# Patient Record
Sex: Female | Born: 1975
Health system: Southern US, Community
[De-identification: ages and names within clinical notes are randomized; demographics above are authoritative.]

## PROBLEM LIST (undated history)

## (undated) ENCOUNTER — Ambulatory Visit (HOSPITAL_COMMUNITY): Payer: No Typology Code available for payment source | Source: Home / Self Care

## (undated) ENCOUNTER — Inpatient Hospital Stay (HOSPITAL_COMMUNITY): Payer: Self-pay

## (undated) DIAGNOSIS — M2550 Pain in unspecified joint: Secondary | ICD-10-CM

## (undated) DIAGNOSIS — T7840XA Allergy, unspecified, initial encounter: Secondary | ICD-10-CM

## (undated) DIAGNOSIS — R0602 Shortness of breath: Secondary | ICD-10-CM

## (undated) DIAGNOSIS — I10 Essential (primary) hypertension: Secondary | ICD-10-CM

## (undated) DIAGNOSIS — G43019 Migraine without aura, intractable, without status migrainosus: Secondary | ICD-10-CM

## (undated) DIAGNOSIS — R5383 Other fatigue: Secondary | ICD-10-CM

## (undated) DIAGNOSIS — K5900 Constipation, unspecified: Secondary | ICD-10-CM

## (undated) DIAGNOSIS — E538 Deficiency of other specified B group vitamins: Secondary | ICD-10-CM

## (undated) DIAGNOSIS — M549 Dorsalgia, unspecified: Secondary | ICD-10-CM

## (undated) DIAGNOSIS — G971 Other reaction to spinal and lumbar puncture: Secondary | ICD-10-CM

## (undated) DIAGNOSIS — R002 Palpitations: Secondary | ICD-10-CM

## (undated) DIAGNOSIS — I38 Endocarditis, valve unspecified: Secondary | ICD-10-CM

## (undated) DIAGNOSIS — K589 Irritable bowel syndrome without diarrhea: Secondary | ICD-10-CM

## (undated) DIAGNOSIS — D649 Anemia, unspecified: Secondary | ICD-10-CM

## (undated) DIAGNOSIS — M797 Fibromyalgia: Secondary | ICD-10-CM

## (undated) DIAGNOSIS — K219 Gastro-esophageal reflux disease without esophagitis: Secondary | ICD-10-CM

## (undated) DIAGNOSIS — Z975 Presence of (intrauterine) contraceptive device: Secondary | ICD-10-CM

## (undated) DIAGNOSIS — M7989 Other specified soft tissue disorders: Secondary | ICD-10-CM

## (undated) DIAGNOSIS — J45909 Unspecified asthma, uncomplicated: Secondary | ICD-10-CM

## (undated) HISTORY — DX: Essential (primary) hypertension: I10

## (undated) HISTORY — DX: Endocarditis, valve unspecified: I38

## (undated) HISTORY — PX: OTHER SURGICAL HISTORY: SHX169

## (undated) HISTORY — DX: Other specified soft tissue disorders: M79.89

## (undated) HISTORY — DX: Shortness of breath: R06.02

## (undated) HISTORY — DX: Gastro-esophageal reflux disease without esophagitis: K21.9

## (undated) HISTORY — DX: Allergy, unspecified, initial encounter: T78.40XA

## (undated) HISTORY — PX: ADENOIDECTOMY: SUR15

## (undated) HISTORY — DX: Other fatigue: R53.83

## (undated) HISTORY — DX: Migraine without aura, intractable, without status migrainosus: G43.019

## (undated) HISTORY — DX: Unspecified asthma, uncomplicated: J45.909

## (undated) HISTORY — DX: Presence of (intrauterine) contraceptive device: Z97.5

## (undated) HISTORY — DX: Deficiency of other specified B group vitamins: E53.8

## (undated) HISTORY — DX: Pain in unspecified joint: M25.50

## (undated) HISTORY — DX: Fibromyalgia: M79.7

## (undated) HISTORY — DX: Dorsalgia, unspecified: M54.9

## (undated) HISTORY — DX: Constipation, unspecified: K59.00

## (undated) HISTORY — DX: Palpitations: R00.2

---
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
## 2001-04-12 HISTORY — PX: OTHER SURGICAL HISTORY: SHX169

## 2003-04-13 DIAGNOSIS — K589 Irritable bowel syndrome without diarrhea: Secondary | ICD-10-CM

## 2003-04-13 HISTORY — DX: Irritable bowel syndrome, unspecified: K58.9

## 2003-04-13 HISTORY — PX: VAGINA SURGERY: SHX829

## 2011-05-10 ENCOUNTER — Inpatient Hospital Stay (HOSPITAL_COMMUNITY)
Admission: AD | Admit: 2011-05-10 | Discharge: 2011-05-10 | Disposition: A | Discharge status: Home / Self Care | Payer: Medicaid Other | Source: Ambulatory Visit | Attending: Obstetrics & Gynecology | Admitting: Obstetrics & Gynecology

## 2011-05-10 ENCOUNTER — Encounter (HOSPITAL_COMMUNITY): Payer: Self-pay

## 2011-05-10 ENCOUNTER — Inpatient Hospital Stay (HOSPITAL_COMMUNITY): Payer: Medicaid Other

## 2011-05-10 DIAGNOSIS — R109 Unspecified abdominal pain: Secondary | ICD-10-CM | POA: Insufficient documentation

## 2011-05-10 DIAGNOSIS — O0930 Supervision of pregnancy with insufficient antenatal care, unspecified trimester: Secondary | ICD-10-CM

## 2011-05-10 DIAGNOSIS — R1031 Right lower quadrant pain: Secondary | ICD-10-CM

## 2011-05-10 DIAGNOSIS — N949 Unspecified condition associated with female genital organs and menstrual cycle: Secondary | ICD-10-CM

## 2011-05-10 HISTORY — DX: Anemia, unspecified: D64.9

## 2011-05-10 LAB — URINALYSIS, ROUTINE W REFLEX MICROSCOPIC
Glucose, UA: NEGATIVE mg/dL
Leukocytes, UA: NEGATIVE
Nitrite: NEGATIVE
Specific Gravity, Urine: 1.02 (ref 1.005–1.030)
pH: 6 (ref 5.0–8.0)

## 2011-05-10 LAB — CBC
HCT: 34.8 % — ABNORMAL LOW (ref 36.0–46.0)
MCHC: 33 g/dL (ref 30.0–36.0)
Platelets: 212 10*3/uL (ref 150–400)
RDW: 14.9 % (ref 11.5–15.5)
WBC: 8.5 10*3/uL (ref 4.0–10.5)

## 2011-05-10 LAB — RPR
RPR Ser Ql: NONREACTIVE
RPR: NONREACTIVE

## 2011-05-10 LAB — RUBELLA SCREEN: Rubella: 127.3 IU/mL — ABNORMAL HIGH

## 2011-05-10 LAB — WET PREP, GENITAL: Yeast Wet Prep HPF POC: NONE SEEN

## 2011-05-10 LAB — DIFFERENTIAL
Basophils Absolute: 0 10*3/uL (ref 0.0–0.1)
Basophils Relative: 0 % (ref 0–1)
Monocytes Absolute: 0.4 10*3/uL (ref 0.1–1.0)
Neutro Abs: 5.2 10*3/uL (ref 1.7–7.7)
Neutrophils Relative %: 61 % (ref 43–77)

## 2011-05-10 LAB — HEPATITIS B SURFACE ANTIGEN: Hepatitis B Surface Ag: NEGATIVE

## 2011-05-10 LAB — URINE MICROSCOPIC-ADD ON

## 2011-05-10 LAB — RAPID HIV SCREEN (WH-MAU): Rapid HIV Screen: NONREACTIVE

## 2011-05-10 NOTE — ED Provider Notes (Signed)
History     Chief Complaint  Patient presents with  . Abdominal Pain   HPI Visit conducted using phone interpreter services. Pt  36 y/o [id number] with 36.1 weeks that comes to MAU for abdominal pain. She has had prenatal care in [place] but did not bring any document with her. Her EDD is apparently base in prior ultrasound in her home country. She is been here [place] for two days.  Has prior history of health and Ob history of 1 neonatal death. All pregnancies at term and normal baby weight. Prior babies delivered by C-section. On this pregnancy she mentions has had treatment with bacteriorstatic for urinary symptoms since she is 1 month pregnant(?) Today she has had mild right lower abdominal pain with irradiation to inguinal area. Also complaint of dysuria. Has had episodes of non-bloody, non-mucous diarrhea. Afebrile. Her other complaint is lower extremity edema and no symptoms of headaches, visual disturbances nor epigastric abdominal pain. Normal white vaginal discharge, no vaginal bleeding.    OB History    Grav Para Term Preterm Abortions TAB SAB Ect Mult Living   4 3 3       2       Past Medical History  Diagnosis Date  . Anemia   . Neonatal death     Vaginal delivery, full term-lived x2 hours.     Past Surgical History  Procedure Date  . Cesarean section     Family History  Problem Relation Age of Onset  . Anesthesia problems Neg Hx     History  Substance Use Topics  . Smoking status: Never Smoker   . Smokeless tobacco: Never Used  . Alcohol Use: No    Allergies: No Known Allergies  Prescriptions prior to admission  Medication Sig Dispense Refill  . BISOPROLOL FUMARATE PO Take 1 tablet by mouth 2 (two) times daily. Arabic medication, for fast heartbeat, has not had in the last 10 days      . PRESCRIPTION MEDICATION Take 1 tablet by mouth daily. Ritodrine (Yutopar) 10mg , Arabic medication used to stop contractions      . PRESCRIPTION MEDICATION Take 1  tablet by mouth daily as needed. Navidoxine (Meclozine 25mg  & Pyridoxine50mg ) used for nausea & vomiting, arabic medication      . PRESCRIPTION MEDICATION Take 2-3 tablets by mouth 3 (three) times daily after meals. (Moxal) Magnesium Hydroxide 100mg /hydroxide gel 450mg  used for heartburn, arabic medication        Review of Systems  Constitutional: Negative.   HENT: Negative.   Eyes: Negative.   Respiratory: Negative.   Cardiovascular: Negative.   Gastrointestinal: Positive for abdominal pain. Negative for nausea and vomiting.  Genitourinary: Positive for dysuria. Negative for urgency and frequency.  Musculoskeletal: Negative.   Skin: Negative.   Neurological: Negative.   Psychiatric/Behavioral: Negative.    Physical Exam   Blood pressure 112/80, pulse 91, temperature 98.2 F (36.8 C), temperature source Oral, resp. rate 16.  Physical Exam  Constitutional: She is oriented to person, place, and time.  HENT:  Head: Normocephalic and atraumatic.  Mouth/Throat: Oropharynx is clear and moist. No oropharyngeal exudate.  Eyes: Conjunctivae and EOM are normal. Pupils are equal, round, and reactive to light. No scleral icterus.  Neck: Neck supple. No tracheal deviation present. No thyromegaly present.  Cardiovascular: Normal rate, regular rhythm, normal heart sounds and intact distal pulses.  Exam reveals no gallop and no friction rub.   No murmur heard. Respiratory: Effort normal and breath sounds normal. No respiratory distress.  She has no wheezes. She has no rales.  GI: Soft. Bowel sounds are normal.       Normal physical exam of a 36 weeks gravid pt.  Musculoskeletal:       Mild lower extremity edema+1  Lymphadenopathy:    She has no cervical adenopathy.  Neurological: She is alert and oriented to person, place, and time. She has normal reflexes. No cranial nerve deficit.  Skin: Skin is warm and dry.    MAU Course  Procedures  MDM Reassuring FHT Ordered: prenatal panel, [place],  Urine micro, GC and Chlamydia and GBS.  Assessment and Plan  Pt  36 y/o [id number] with 36.1 weeks that comes to MAU for abdominal pain most consistent with round ligament pain. Awaiting results of tests ordered. Pt discussed with incoming team.    D. [place] Sherron Flemings Paz. MD PGY-1 05/10/2011, 12:10 PM

## 2011-05-10 NOTE — Progress Notes (Signed)
Pt states via [employer] interpreter that she has been here from [place] x2days. States abd pain, lower extremity swelling. Frequent bowel movements. Has headache also. +FM. Denies bleeding or vaginal discharge changes. Pain only on right lower abdomen. Notes burning and urgency with voiding.

## 2011-05-10 NOTE — ED Provider Notes (Signed)
History     Chief Complaint  Patient presents with  . Abdominal Pain   HPI Pelvic exam done  ROS Physical Exam   Blood pressure 112/80, pulse 91, temperature 98.2 F (36.8 C), temperature source Oral, resp. rate 16.  Physical Exam  MAU Course  Procedures   Assessment and Plan  GC/Chlam and Wet Prep and GBS done.  [place] normal. Cephalic. Will refer to [facility] clinic.   Wynelle Bourgeois 05/10/2011, 1:02 PM

## 2011-05-12 NOTE — ED Provider Notes (Signed)
Seen by me .  Agree.

## 2011-05-13 LAB — CULTURE, BETA STREP (GROUP B ONLY)

## 2011-05-19 ENCOUNTER — Ambulatory Visit (INDEPENDENT_AMBULATORY_CARE_PROVIDER_SITE_OTHER): Payer: Medicaid Other | Admitting: Physician Assistant

## 2011-05-19 VITALS — BP 121/81 | Temp 98.9°F | Ht <= 58 in | Wt 148.7 lb

## 2011-05-19 DIAGNOSIS — O0930 Supervision of pregnancy with insufficient antenatal care, unspecified trimester: Secondary | ICD-10-CM

## 2011-05-19 DIAGNOSIS — O34219 Maternal care for unspecified type scar from previous cesarean delivery: Secondary | ICD-10-CM

## 2011-05-19 DIAGNOSIS — O4100X0 Oligohydramnios, unspecified trimester, not applicable or unspecified: Secondary | ICD-10-CM

## 2011-05-19 LAB — POCT URINALYSIS DIP (DEVICE)
Protein, ur: 30 mg/dL — AB
Specific Gravity, Urine: 1.02 (ref 1.005–1.030)
Urobilinogen, UA: 1 mg/dL (ref 0.0–1.0)

## 2011-05-19 LAB — GLUCOSE, CAPILLARY: Glucose-Capillary: 84 mg/dL (ref 70–99)

## 2011-05-19 NOTE — Progress Notes (Signed)
Needs prescription for prenatal vitamins, P=106, Used [employer], Discussed appropritate weight gain, given new pregnancy booklet, c/o pelvic pain at old c/section scar, declines influenza vaccine, states had tetanus shot 2008, had prenatal care in [place]-records not available,  states had glucola was normal- per [place], CNM do fingerstick today- last po intake 3 hours ago was tea with sugar

## 2011-05-19 NOTE — Patient Instructions (Signed)
Cesarean Delivery °Care After °Refer to this sheet in the next few weeks. These instructions provide you with information on caring for yourself after your procedure. Your caregiver may also give you more specific instructions. Your treatment has been planned according to current medical practices, but problems sometimes occur. Call your caregiver if you have any problems or questions after your procedure. °HOME CARE INSTRUCTIONS °Healing will take time. You will have discomfort, tenderness, swelling, and bruising at the surgery site for a couple of weeks. This is normal and will get better as time goes on. °Activity °· Rest as much as possible the first 2 weeks.  °· When possible, have someone help you with your household activities and your baby for 2 to 3 weeks.  °· Limit your housework and social activity. Increase your activity gradually as your strength returns.  °· Do not climb stairs more than 2 to 3 times a day.  °· Do not lift anything heavier than your baby.  °· Follow your caregiver's instructions about driving a car.  °· Exercise only as directed by your caregiver.  °Nutrition °· You may return to your usual diet. Eat a well-balanced diet.  °· Drink enough fluid to keep your urine clear or pale yellow.  °· Keep taking your prenatal or multivitamins.  °· Do not drink alcohol until your caregiver says it is okay.  °Elimination °You should return to your usual bowel function. If you develop constipation, ask your caregiver about taking a mild laxative that will help you go to the bathroom. Bran foods and fluids help with constipation. Gradually add fruit, vegetables, and bran to your diet.  °Hygiene °· You may shower, wash your hair, and take tub baths unless your caregiver tells you otherwise.  °· Continue perineal care until your vaginal bleeding and discharge stops.  °· Do not douche or use tampons until your caregiver says it is okay.  °Fever °If you feel feverish or have shaking chills, take your  temperature. The fever may indicate infection. Infections can be treated with antibiotic medicine. °Pain Control and Medicine °· Only take over-the-counter or prescription medicine as directed by your caregiver. Do not take aspirin. It can cause bleeding.  °· Do not drive when taking pain medicine.  °· Talk to your caregiver about restarting or adjusting your normal medicines.  °Incision Care °· Clean your cut (incision) gently with soap and water, then pat dry.  °· If your caregiver says it is okay, leave the incision without a bandage (dressing) unless it is draining fluid or irritated.  °· If you have small adhesive strips across the incision and they do not fall off within 7 days, carefully peel them off.  °· Check the incision daily for increased redness, drainage, swelling, or separation of skin.  °· Hug a pillow when coughing or sneezing. This helps to relieve pain.  °Vaginal Care °You may have a vaginal discharge or bleeding for up to 6 weeks. If the vaginal discharge becomes bright red, bad smelling, heavy in amount, has blood clots, or if you have burning or frequent urination, call your caregiver. If your bleeding slows down and then gets heavier, your body is telling you to slow down and relax more. °Sexual Intercourse °· Check with your caregiver before resuming sexual activity. Often, after 4 to 6 weeks, if you feel good and are well rested, sexual activity may be resumed. Avoid positions that strain the incision site.  °· You can become pregnant before you have a period.   If you decide to have sexual intercourse, use birth control if you do not want to become pregnant right away.  °Health Practices °· Keep all your postpartum appointments as recommended by your caregiver. Generally, your caregiver will want to see you in 2 to 3 weeks.  °· Continue with your yearly pelvic exams.  °· Continue monthly self-breast exams and yearly physical exams with a Pap test.  °Breast Care °· If you are not  breastfeeding and your breasts become tender, hard, or leak milk, you may wear a tight-fitting bra and apply ice to your breasts.  °· If you are breastfeeding, wear a good support bra.  °· Call your caregiver if you have breast pain, flu-like symptoms, fever, or hardness and reddening of your breasts.  °Postpartum Blues °You may have a period of low spirits or "blues" after your baby is born. Discuss your feelings with your partner, family, and friends. This may be caused by the changing hormone levels in your body. You may want to contact your caregiver if this is worrisome. °Miscellaneous °· Limit wearing support panties or control-top hose.  °· If you breastfeed, you may not have a period for several months or longer. This is normal for the nursing mother. If you do not menstruate within 6 weeks after you stop breastfeeding, see your caregiver.  °· If you are not breastfeeding, you can expect to menstruate within 6 to 10 weeks after birth. If you have not started by the 11th week, check with your caregiver.  °SEEK MEDICAL CARE IF:  °· There is swelling, redness, or increasing pain in the wound area.  °· You have pus coming from the wound.  °· You notice a bad smell from the wound or surgical dressing.  °· You have pain, redness, and swelling from the intravenous (IV) site.  °· Your wound breaks open (the edges are not staying together).  °· You feel dizzy or feel like fainting.  °· You develop pain or bleeding when you urinate.  °· You develop diarrhea.  °· You develop nausea and vomiting.  °· You develop abnormal vaginal discharge.  °· You develop a rash.  °· You have any type of abnormal reaction or develop an allergy to your medicine.  °· Your pain is not relieved by your medicine or becomes worse.  °· Your temperature is 101° F (38.3° C), or is 100.4° F (38° C) taken 2 times in a 4 hour period.  °SEEK IMMEDIATE MEDICAL CARE: °· You develop a temperature of 102° F (38.9° C) or higher.  °· You develop abdominal  pain.  °· You develop chest pain.  °· You develop shortness of breath.  °· You faint.  °· You develop pain, swelling, or redness of your leg.  °· You develop heavy vaginal bleeding with or without blood clots.  °Document Released: 12/19/2001 Document Revised: 12/09/2010 Document Reviewed: 06/24/2010 °ExitCare® Patient Information ©2012 [employer]. °

## 2011-05-19 NOTE — Progress Notes (Signed)
Results of AFI reported to [facility] CNM

## 2011-05-24 ENCOUNTER — Other Ambulatory Visit: Payer: Self-pay

## 2011-05-24 DIAGNOSIS — Z20820 Contact with and (suspected) exposure to varicella: Secondary | ICD-10-CM

## 2011-05-25 ENCOUNTER — Telehealth: Payer: Self-pay | Admitting: *Deleted

## 2011-05-25 NOTE — Telephone Encounter (Signed)
Called pt with [facility] interpreter # [phone]. She was informed of the test results indicating that she has immunity to chicken pox. She was also informed that she will no longer to wear a mask when coming to the hospital. Pt advised that her son should not come to the hospital until he has completely recovered from the chicken pox. Pt voiced understanding.

## 2011-05-25 NOTE — Telephone Encounter (Signed)
Message copied by Jill Side on Tue May 25, 2011  4:00 PM ------      Message from: Darrel Hoover      Created: Tue May 25, 2011  3:55 PM      Regarding: immune       Please notify patient of immune positive status for chicken pox.  No need to worry with masks when they come back to the hospital.        Thanks,      Tresa Endo

## 2011-05-26 ENCOUNTER — Ambulatory Visit (INDEPENDENT_AMBULATORY_CARE_PROVIDER_SITE_OTHER): Payer: Self-pay | Admitting: Physician Assistant

## 2011-05-26 ENCOUNTER — Encounter: Payer: Self-pay | Admitting: Advanced Practice Midwife

## 2011-05-26 ENCOUNTER — Encounter: Payer: Self-pay | Admitting: Physician Assistant

## 2011-05-26 VITALS — BP 134/88 | Temp 98.0°F | Wt 146.2 lb

## 2011-05-26 DIAGNOSIS — Z3490 Encounter for supervision of normal pregnancy, unspecified, unspecified trimester: Secondary | ICD-10-CM

## 2011-05-26 DIAGNOSIS — O34219 Maternal care for unspecified type scar from previous cesarean delivery: Secondary | ICD-10-CM

## 2011-05-26 DIAGNOSIS — O0930 Supervision of pregnancy with insufficient antenatal care, unspecified trimester: Secondary | ICD-10-CM | POA: Insufficient documentation

## 2011-05-26 DIAGNOSIS — Z98891 History of uterine scar from previous surgery: Secondary | ICD-10-CM

## 2011-05-26 DIAGNOSIS — O4100X0 Oligohydramnios, unspecified trimester, not applicable or unspecified: Secondary | ICD-10-CM

## 2011-05-26 LAB — POCT URINALYSIS DIP (DEVICE)
Ketones, ur: NEGATIVE mg/dL
Protein, ur: 30 mg/dL — AB
Specific Gravity, Urine: 1.015 (ref 1.005–1.030)
pH: 6 (ref 5.0–8.0)

## 2011-05-26 MED ORDER — PRENATAL RX 60-1 MG PO TABS: 1.0000 | ORAL_TABLET | Freq: Every day | ORAL | Status: DC

## 2011-05-26 NOTE — Patient Instructions (Signed)
________________________________________     To schedule your Maternity Eligibility Appointment, please call [phone].  When you arrive for your appointment you must bring the following items or information listed below.  Your appointment will be rescheduled if you do not have these items or are 15 minutes late. If currently receiving Medicaid, you MUST bring: 1. Medicaid Card 2. Social Security Card 3. Picture ID 4. Proof of Pregnancy 5. Verification of current address if the address on Medicaid card is incorrect "postmarked mail" If not receiving Medicaid, you MUST bring: 1. Social Security Card 2. Picture ID 3. Birth Certificate (if available) Passport or *Green Card 4. Proof of Pregnancy 5. Verification of current address "postmarked mail" for each income presented. 6. Verification of insurance coverage, if any 7. Check stubs from each employer for the previous month (if unable to present check stub  for each week, we will accept check stub for the first and last week ill the same month.) If you can't locate check stubs, you must bring a letter from the employer(s) and it must have the following information on letterhead, typed, in English: o name of company o company telephone number o how long been with the company, if less than one month o how much person earns per hour o how many hours per week work o the gross pay the person earned for the previous month If you are 34 years old or less, you do not have to bring proof of income unless you work or live with the father of the baby and at that time we will need proof of income from you and/or the father of the baby. Green Card recipients are eligible for Medicaid for Pregnant Women (MPW)   Cesarean Delivery Care After Refer to this sheet in the next few weeks. These instructions provide you with information on caring for yourself after your procedure. Your caregiver may also give you more specific instructions. Your  treatment has been planned according to current medical practices, but problems sometimes occur. Call your caregiver if you have any problems or questions after your procedure. HOME CARE INSTRUCTIONS Healing will take time. You will have discomfort, tenderness, swelling, and bruising at the surgery site for a couple of weeks. This is normal and will get better as time goes on. Activity  Rest as much as possible the first 2 weeks.   When possible, have someone help you with your household activities and your baby for 2 to 3 weeks.   Limit your housework and social activity. Increase your activity gradually as your strength returns.   Do not climb stairs more than 2 to 3 times a day.   Do not lift anything heavier than your baby.   Follow your caregiver's instructions about driving a car.   Exercise only as directed by your caregiver.  Nutrition  You may return to your usual diet. Eat a well-balanced diet.   Drink enough fluid to keep your urine clear or pale yellow.   Keep taking your prenatal or multivitamins.   Do not drink alcohol until your caregiver says it is okay.  Elimination You should return to your usual bowel function. If you develop constipation, ask your caregiver about taking a mild laxative that will help you go to the bathroom. Bran foods and fluids help with constipation. Gradually add fruit, vegetables, and bran to your diet.  Hygiene  You may shower, wash your hair, and take tub baths unless your caregiver tells you otherwise.  Continue perineal care until your vaginal bleeding and discharge stops.   Do not douche or use tampons until your caregiver says it is okay.  Fever If you feel feverish or have shaking chills, take your temperature. The fever may indicate infection. Infections can be treated with antibiotic medicine. Pain Control and Medicine  Only take over-the-counter or prescription medicine as directed by your caregiver. Do not take aspirin. It  can cause bleeding.   Do not drive when taking pain medicine.   Talk to your caregiver about restarting or adjusting your normal medicines.  Incision Care  Clean your cut (incision) gently with soap and water, then pat dry.   If your caregiver says it is okay, leave the incision without a bandage (dressing) unless it is draining fluid or irritated.   If you have small adhesive strips across the incision and they do not fall off within 7 days, carefully peel them off.   Check the incision daily for increased redness, drainage, swelling, or separation of skin.   Hug a pillow when coughing or sneezing. This helps to relieve pain.  Vaginal Care You may have a vaginal discharge or bleeding for up to 6 weeks. If the vaginal discharge becomes bright red, bad smelling, heavy in amount, has blood clots, or if you have burning or frequent urination, call your caregiver.If your bleeding slows down and then gets heavier, your body is telling you to slow down and relax more. Sexual Intercourse  Check with your caregiver before resuming sexual activity. Often, after 4 to 6 weeks, if you feel good and are well rested, sexual activity may be resumed. Avoid positions that strain the incision site.   You can become pregnant before you have a period. If you decide to have sexual intercourse, use birth control if you do not want to become pregnant right away.  Health Practices  Keep all your postpartum appointments as recommended by your caregiver. Generally, your caregiver will want to see you in 2 to 3 weeks.   Continue with your yearly pelvic exams.   Continue monthly self-breast exams and yearly physical exams with a Pap test.  Breast Care  If you are not breastfeeding and your breasts become tender, hard, or leak milk, you may wear a tight-fitting bra and apply ice to your breasts.   If you are breastfeeding, wear a good support bra.   Call your caregiver if you have breast pain, flu-like  symptoms, fever, or hardness and reddening of your breasts.  Postpartum Blues You may have a period of low spirits or "blues" after your baby is born. Discuss your feelings with your partner, family, and friends. This may be caused by the changing hormone levels in your body. You may want to contact your caregiver if this is worrisome. Miscellaneous  Limit wearing support panties or control-top hose.   If you breastfeed, you may not have a period for several months or longer. This is normal for the nursing mother. If you do not menstruate within 6 weeks after you stop breastfeeding, see your caregiver.   If you are not breastfeeding, you can expect to menstruate within 6 to 10 weeks after birth. If you have not started by the 11th week, check with your caregiver.  SEEK MEDICAL CARE IF:   There is swelling, redness, or increasing pain in the wound area.   You have pus coming from the wound.   You notice a bad smell from the wound or surgical dressing.  You have pain, redness, and swelling from the intravenous (IV) site.   Your wound breaks open (the edges are not staying together).   You feel dizzy or feel like fainting.   You develop pain or bleeding when you urinate.   You develop diarrhea.   You develop nausea and vomiting.   You develop abnormal vaginal discharge.   You develop a rash.   You have any type of abnormal reaction or develop an allergy to your medicine.   Your pain is not relieved by your medicine or becomes worse.   Your temperature is 101 F (38.3 C), or is 100.4 F (38 C) taken 2 times in a 4 hour period.  SEEK IMMEDIATE MEDICAL CARE:  You develop a temperature of 102 F (38.9 C) or higher.   You develop abdominal pain.   You develop chest pain.   You develop shortness of breath.   You faint.   You develop pain, swelling, or redness of your leg.   You develop heavy vaginal bleeding with or without blood clots.  Document Released: 12/19/2001  Document Revised: 12/09/2010 Document Reviewed: 06/24/2010 [facility] Patient Information 2012 [place], [place].

## 2011-05-26 NOTE — Progress Notes (Signed)
Inbox message sent to [place] for [facility]. Pt declines BTS with c/s. Desires POPs for contraception. Pt counselled to stop meds from "home" due to unknown content. Rec: tylenol, sudafed, and musinex for URI s/s.

## 2011-05-27 ENCOUNTER — Other Ambulatory Visit: Payer: Self-pay | Admitting: Obstetrics & Gynecology

## 2011-05-28 ENCOUNTER — Encounter (HOSPITAL_COMMUNITY): Payer: Self-pay | Admitting: Pharmacist

## 2011-06-02 ENCOUNTER — Inpatient Hospital Stay (HOSPITAL_COMMUNITY)
Admission: AD | Admit: 2011-06-02 | Discharge: 2011-06-05 | DRG: 766 | Disposition: A | Discharge status: Home / Self Care | Payer: Medicaid Other | Source: Ambulatory Visit | Attending: Obstetrics and Gynecology | Admitting: Obstetrics and Gynecology

## 2011-06-02 ENCOUNTER — Encounter (HOSPITAL_COMMUNITY): Payer: Self-pay | Admitting: Anesthesiology

## 2011-06-02 ENCOUNTER — Encounter (HOSPITAL_COMMUNITY): Payer: Self-pay | Admitting: *Deleted

## 2011-06-02 ENCOUNTER — Encounter (HOSPITAL_COMMUNITY)
Admission: AD | Disposition: A | Discharge status: Home / Self Care | Payer: Self-pay | Source: Ambulatory Visit | Attending: Obstetrics and Gynecology

## 2011-06-02 ENCOUNTER — Inpatient Hospital Stay (HOSPITAL_COMMUNITY): Payer: Medicaid Other | Admitting: Anesthesiology

## 2011-06-02 ENCOUNTER — Ambulatory Visit (INDEPENDENT_AMBULATORY_CARE_PROVIDER_SITE_OTHER): Payer: Self-pay | Admitting: Advanced Practice Midwife

## 2011-06-02 DIAGNOSIS — O34219 Maternal care for unspecified type scar from previous cesarean delivery: Secondary | ICD-10-CM

## 2011-06-02 DIAGNOSIS — O0930 Supervision of pregnancy with insufficient antenatal care, unspecified trimester: Secondary | ICD-10-CM

## 2011-06-02 DIAGNOSIS — Z98891 History of uterine scar from previous surgery: Secondary | ICD-10-CM

## 2011-06-02 DIAGNOSIS — IMO0002 Reserved for concepts with insufficient information to code with codable children: Secondary | ICD-10-CM

## 2011-06-02 DIAGNOSIS — O4100X0 Oligohydramnios, unspecified trimester, not applicable or unspecified: Secondary | ICD-10-CM

## 2011-06-02 LAB — POCT URINALYSIS DIP (DEVICE)
Bilirubin Urine: NEGATIVE
Glucose, UA: NEGATIVE mg/dL
Leukocytes, UA: NEGATIVE
Nitrite: NEGATIVE
Urobilinogen, UA: 0.2 mg/dL (ref 0.0–1.0)

## 2011-06-02 LAB — CBC
HCT: 38.2 % (ref 36.0–46.0)
Hemoglobin: 12.7 g/dL (ref 12.0–15.0)
RBC: 4.31 MIL/uL (ref 3.87–5.11)

## 2011-06-02 LAB — ABO/RH: ABO/RH(D): O POS

## 2011-06-02 SURGERY — Surgical Case
Anesthesia: Spinal | Site: Abdomen | Wound class: Clean Contaminated

## 2011-06-02 MED ORDER — SODIUM CHLORIDE 0.9 % IJ SOLN: 3.0000 mL | INTRAMUSCULAR | Status: DC | PRN

## 2011-06-02 MED ORDER — DIPHENHYDRAMINE HCL 50 MG/ML IJ SOLN: 12.5000 mg | INTRAMUSCULAR | Status: DC | PRN

## 2011-06-02 MED ORDER — METOCLOPRAMIDE HCL 5 MG/ML IJ SOLN: 10.0000 mg | Freq: Three times a day (TID) | INTRAMUSCULAR | Status: DC | PRN

## 2011-06-02 MED ORDER — FAMOTIDINE IN NACL 20-0.9 MG/50ML-% IV SOLN
20.0000 mg | Freq: Once | INTRAVENOUS | Status: AC
  Administered 2011-06-02: 20 mg via INTRAVENOUS
  Filled 2011-06-02: qty 50

## 2011-06-02 MED ORDER — MORPHINE SULFATE (PF) 0.5 MG/ML IJ SOLN
INTRAMUSCULAR | Status: DC | PRN
  Administered 2011-06-02: .1 mg via INTRATHECAL

## 2011-06-02 MED ORDER — FENTANYL CITRATE 0.05 MG/ML IJ SOLN: 25.0000 ug | INTRAMUSCULAR | Status: DC | PRN

## 2011-06-02 MED ORDER — CEFAZOLIN SODIUM 1-5 GM-% IV SOLN
1.0000 g | INTRAVENOUS | Status: AC
  Administered 2011-06-02: 1 g via INTRAVENOUS
  Filled 2011-06-02: qty 50

## 2011-06-02 MED ORDER — PROMETHAZINE HCL 25 MG/ML IJ SOLN: 6.2500 mg | INTRAMUSCULAR | Status: DC | PRN

## 2011-06-02 MED ORDER — DIPHENHYDRAMINE HCL 50 MG/ML IJ SOLN: 25.0000 mg | INTRAMUSCULAR | Status: DC | PRN

## 2011-06-02 MED ORDER — MORPHINE SULFATE 0.5 MG/ML IJ SOLN
INTRAMUSCULAR | Status: AC
  Filled 2011-06-02: qty 10

## 2011-06-02 MED ORDER — PHENYLEPHRINE HCL 10 MG/ML IJ SOLN
INTRAMUSCULAR | Status: DC | PRN
  Administered 2011-06-02 – 2011-06-03 (×4): 40 ug via INTRAVENOUS

## 2011-06-02 MED ORDER — SCOPOLAMINE 1 MG/3DAYS TD PT72
1.0000 | MEDICATED_PATCH | Freq: Once | TRANSDERMAL | Status: DC
  Administered 2011-06-03: 1.5 mg via TRANSDERMAL
  Filled 2011-06-02: qty 1

## 2011-06-02 MED ORDER — FENTANYL CITRATE 0.05 MG/ML IJ SOLN
INTRAMUSCULAR | Status: AC
  Filled 2011-06-02: qty 2

## 2011-06-02 MED ORDER — KETOROLAC TROMETHAMINE 30 MG/ML IJ SOLN
30.0000 mg | Freq: Four times a day (QID) | INTRAMUSCULAR | Status: DC | PRN
  Administered 2011-06-03: 30 mg via INTRAVENOUS

## 2011-06-02 MED ORDER — DIPHENHYDRAMINE HCL 25 MG PO CAPS: 25.0000 mg | ORAL_CAPSULE | ORAL | Status: DC | PRN

## 2011-06-02 MED ORDER — CITRIC ACID-SODIUM CITRATE 334-500 MG/5ML PO SOLN
30.0000 mL | Freq: Once | ORAL | Status: AC
  Administered 2011-06-02: 30 mL via ORAL
  Filled 2011-06-02: qty 15

## 2011-06-02 MED ORDER — LACTATED RINGERS IV SOLN
INTRAVENOUS | Status: DC
  Administered 2011-06-02: 23:00:00 via INTRAVENOUS

## 2011-06-02 MED ORDER — BUPIVACAINE IN DEXTROSE 0.75-8.25 % IT SOLN
INTRATHECAL | Status: DC | PRN
  Administered 2011-06-02: 11 mg via INTRATHECAL

## 2011-06-02 MED ORDER — ACETAMINOPHEN 10 MG/ML IV SOLN
1000.0000 mg | Freq: Four times a day (QID) | INTRAVENOUS | Status: DC | PRN
  Filled 2011-06-02: qty 100

## 2011-06-02 MED ORDER — ACETAMINOPHEN 325 MG PO TABS: 325.0000 mg | ORAL_TABLET | ORAL | Status: DC | PRN

## 2011-06-02 MED ORDER — NALOXONE HCL 0.4 MG/ML IJ SOLN: 0.4000 mg | INTRAMUSCULAR | Status: DC | PRN

## 2011-06-02 MED ORDER — NALBUPHINE HCL 10 MG/ML IJ SOLN: 5.0000 mg | INTRAMUSCULAR | Status: DC | PRN

## 2011-06-02 MED ORDER — KETOROLAC TROMETHAMINE 30 MG/ML IJ SOLN: 30.0000 mg | Freq: Four times a day (QID) | INTRAMUSCULAR | Status: DC | PRN

## 2011-06-02 MED ORDER — SODIUM CHLORIDE 0.9 % IV SOLN: 1.0000 ug/kg/h | INTRAVENOUS | Status: DC | PRN

## 2011-06-02 MED ORDER — FENTANYL CITRATE 0.05 MG/ML IJ SOLN
INTRAMUSCULAR | Status: DC | PRN
  Administered 2011-06-02: 25 ug via INTRATHECAL

## 2011-06-02 MED ORDER — MEPERIDINE HCL 25 MG/ML IJ SOLN
6.2500 mg | INTRAMUSCULAR | Status: DC | PRN
  Administered 2011-06-03: 6.25 mg via INTRAVENOUS

## 2011-06-02 MED ORDER — ONDANSETRON HCL 4 MG/2ML IJ SOLN: 4.0000 mg | Freq: Three times a day (TID) | INTRAMUSCULAR | Status: DC | PRN

## 2011-06-02 MED ORDER — IBUPROFEN 600 MG PO TABS: 600.0000 mg | ORAL_TABLET | Freq: Four times a day (QID) | ORAL | Status: DC | PRN

## 2011-06-02 SURGICAL SUPPLY — 30 items
BENZOIN TINCTURE PRP APPL 2/3 (GAUZE/BANDAGES/DRESSINGS) ×2 IMPLANT
CLOTH BEACON ORANGE TIMEOUT ST (SAFETY) ×2 IMPLANT
DRSG COVADERM 4X6 (GAUZE/BANDAGES/DRESSINGS) ×2 IMPLANT
ELECT REM PT RETURN 9FT ADLT (ELECTROSURGICAL) ×2
ELECTRODE REM PT RTRN 9FT ADLT (ELECTROSURGICAL) ×1 IMPLANT
EXTRACTOR VACUUM KIWI (MISCELLANEOUS) IMPLANT
GLOVE BIO SURGEON ST LM GN SZ9 (GLOVE) ×2 IMPLANT
GLOVE BIOGEL PI IND STRL 9 (GLOVE) ×2 IMPLANT
GLOVE BIOGEL PI INDICATOR 9 (GLOVE) ×2
GOWN PREVENTION PLUS LG XLONG (DISPOSABLE) ×2 IMPLANT
GOWN PREVENTION PLUS XLARGE (GOWN DISPOSABLE) ×2 IMPLANT
GOWN STRL REIN 3XL LVL4 (GOWN DISPOSABLE) ×2 IMPLANT
NEEDLE HYPO 25X5/8 SAFETYGLIDE (NEEDLE) IMPLANT
NS IRRIG 1000ML POUR BTL (IV SOLUTION) ×2 IMPLANT
PACK C SECTION WH (CUSTOM PROCEDURE TRAY) ×2 IMPLANT
PAD ABD 7.5X8 STRL (GAUZE/BANDAGES/DRESSINGS) ×2 IMPLANT
RETRACTOR WND ALEXIS 25 LRG (MISCELLANEOUS) ×1 IMPLANT
RTRCTR C-SECT PINK 25CM LRG (MISCELLANEOUS) ×2 IMPLANT
RTRCTR WOUND ALEXIS 25CM LRG (MISCELLANEOUS) ×2
SLEEVE SCD COMPRESS KNEE MED (MISCELLANEOUS) ×2 IMPLANT
STRIP CLOSURE SKIN 1/2X4 (GAUZE/BANDAGES/DRESSINGS) ×6 IMPLANT
SUT CHROMIC 0 CTX 36 (SUTURE) ×6 IMPLANT
SUT VIC AB 0 CT1 27 (SUTURE) ×1
SUT VIC AB 0 CT1 27XBRD ANBCTR (SUTURE) ×1 IMPLANT
SUT VIC AB 2-0 CT1 27 (SUTURE) ×3
SUT VIC AB 2-0 CT1 TAPERPNT 27 (SUTURE) ×3 IMPLANT
SUT VIC AB 4-0 KS 27 (SUTURE) ×2 IMPLANT
SYR BULB IRRIGATION 50ML (SYRINGE) IMPLANT
TRAY FOLEY CATH 14FR (SET/KITS/TRAYS/PACK) ×2 IMPLANT
WATER STERILE IRR 1000ML POUR (IV SOLUTION) IMPLANT

## 2011-06-02 NOTE — Progress Notes (Signed)
Pelvic pressure. Pulse 99. No vaginal discharge. Pt complains of hemorrhoid. Used interpreter # [id number].

## 2011-06-02 NOTE — Patient Instructions (Addendum)
Gas-X (Simethicone)  Fetal Movement Counts Patient Name: __________________________________________________ Patient Due Date: ____________________ Betty Cain counts is highly recommended in high risk pregnancies, but it is a good idea for every pregnant woman to do. Start counting fetal movements at 28 weeks of the pregnancy. Fetal movements increase after eating a full meal or eating or drinking something sweet (the blood sugar is higher). It is also important to drink plenty of fluids (well hydrated) before doing the count. Lie on your left side because it helps with the circulation or you can sit in a comfortable chair with your arms over your belly (abdomen) with no distractions around you. DOING THE COUNT  Try to do the count the same time of day each time you do it.   Mark the day and time, then see how long it takes for you to feel 10 movements (kicks, flutters, swishes, rolls). You should have at least 10 movements within 2 hours. You will most likely feel 10 movements in much less than 2 hours. If you do not, wait an hour and count again. After a couple of days you will see a pattern.   What you are looking for is a change in the pattern or not enough counts in 2 hours. Is it taking longer in time to reach 10 movements?  SEEK MEDICAL CARE IF:  You feel less than 10 counts in 2 hours. Tried twice.   No movement in one hour.   The pattern is changing or taking longer each day to reach 10 counts in 2 hours.   You feel the baby is not moving as it usually does.  Date: ____________ Movements: ____________ Start time: ____________ Betty Cain time: ____________  Date: ____________ Movements: ____________ Start time: ____________ Betty Cain time: ____________ Date: ____________ Movements: ____________ Start time: ____________ Betty Cain time: ____________ Date: ____________ Movements: ____________ Start time: ____________ Betty Cain time: ____________ Date: ____________ Movements: ____________ Start time:  ____________ Betty Cain time: ____________ Date: ____________ Movements: ____________ Start time: ____________ Betty Cain time: ____________ Date: ____________ Movements: ____________ Start time: ____________ Betty Cain time: ____________ Date: ____________ Movements: ____________ Start time: ____________ Betty Cain time: ____________  Date: ____________ Movements: ____________ Start time: ____________ Betty Cain time: ____________ Date: ____________ Movements: ____________ Start time: ____________ Betty Cain time: ____________ Date: ____________ Movements: ____________ Start time: ____________ Betty Cain time: ____________ Date: ____________ Movements: ____________ Start time: ____________ Betty Cain time: ____________ Date: ____________ Movements: ____________ Start time: ____________ Betty Cain time: ____________ Date: ____________ Movements: ____________ Start time: ____________ Betty Cain time: ____________ Date: ____________ Movements: ____________ Start time: ____________ Betty Cain time: ____________  Date: ____________ Movements: ____________ Start time: ____________ Betty Cain time: ____________ Date: ____________ Movements: ____________ Start time: ____________ Betty Cain time: ____________ Date: ____________ Movements: ____________ Start time: ____________ Betty Cain time: ____________ Date: ____________ Movements: ____________ Start time: ____________ Betty Cain time: ____________ Date: ____________ Movements: ____________ Start time: ____________ Betty Cain time: ____________ Date: ____________ Movements: ____________ Start time: ____________ Betty Cain time: ____________ Date: ____________ Movements: ____________ Start time: ____________ Betty Cain time: ____________  Date: ____________ Movements: ____________ Start time: ____________ Betty Cain time: ____________ Date: ____________ Movements: ____________ Start time: ____________ Betty Cain time: ____________ Date: ____________ Movements: ____________ Start time: ____________ Betty Cain time: ____________ Date:  ____________ Movements: ____________ Start time: ____________ Betty Cain time: ____________ Date: ____________ Movements: ____________ Start time: ____________ Betty Cain time: ____________ Date: ____________ Movements: ____________ Start time: ____________ Betty Cain time: ____________ Date: ____________ Movements: ____________ Start time: ____________ Betty Cain time: ____________  Date: ____________ Movements: ____________ Start time: ____________ Betty Cain time: ____________ Date: ____________ Movements: ____________ Start time: ____________ Betty Cain time: ____________ Date: ____________ Movements:  ____________ Start time: ____________ Betty Cain time: ____________ Date: ____________ Movements: ____________ Start time: ____________ Betty Cain time: ____________ Date: ____________ Movements: ____________ Start time: ____________ Betty Cain time: ____________ Date: ____________ Movements: ____________ Start time: ____________ Betty Cain time: ____________ Date: ____________ Movements: ____________ Start time: ____________ Betty Cain time: ____________  Date: ____________ Movements: ____________ Start time: ____________ Betty Cain time: ____________ Date: ____________ Movements: ____________ Start time: ____________ Betty Cain time: ____________ Date: ____________ Movements: ____________ Start time: ____________ Betty Cain time: ____________ Date: ____________ Movements: ____________ Start time: ____________ Betty Cain time: ____________ Date: ____________ Movements: ____________ Start time: ____________ Betty Cain time: ____________ Date: ____________ Movements: ____________ Start time: ____________ Betty Cain time: ____________ Date: ____________ Movements: ____________ Start time: ____________ Betty Cain time: ____________  Date: ____________ Movements: ____________ Start time: ____________ Betty Cain time: ____________ Date: ____________ Movements: ____________ Start time: ____________ Betty Cain time: ____________ Date: ____________ Movements: ____________ Start  time: ____________ Betty Cain time: ____________ Date: ____________ Movements: ____________ Start time: ____________ Betty Cain time: ____________ Date: ____________ Movements: ____________ Start time: ____________ Betty Cain time: ____________ Date: ____________ Movements: ____________ Start time: ____________ Betty Cain time: ____________ Date: ____________ Movements: ____________ Start time: ____________ Betty Cain time: ____________  Date: ____________ Movements: ____________ Start time: ____________ Betty Cain time: ____________ Date: ____________ Movements: ____________ Start time: ____________ Betty Cain time: ____________ Date: ____________ Movements: ____________ Start time: ____________ Betty Cain time: ____________ Date: ____________ Movements: ____________ Start time: ____________ Betty Cain time: ____________ Date: ____________ Movements: ____________ Start time: ____________ Betty Cain time: ____________ Date: ____________ Movements: ____________ Start time: ____________ Betty Cain time: ____________ Document Released: 04/28/2006 Document Revised: 12/09/2010 Document Reviewed: 10/29/2008 [employer] Patient Information 2012 [employer].Cesarean Delivery  Cesarean delivery is the birth of a baby through a cut (incision) in the abdomen and womb (uterus).  LET YOUR CAREGIVER KNOW ABOUT:  Complicationsinvolving the pregnancy.   Allergies.   Medicines taken including herbs, eyedrops, over-the-counter medicines, and creams.   Use of steroids (by mouth or creams).   Previous problems with anesthetics or numbing medicine.   Previous surgery.   History of blood clots.   History of bleeding or blood problems.   Other health problems.  RISKS AND COMPLICATIONS   Bleeding.   Infection.   Blood clots.   Injury to surrounding organs.   Anesthesia problems.   Injury to the baby.  BEFORE THE PROCEDURE   A tube (Foley catheter) will be placed in your bladder. The Foley catheter drains the urine from your bladder  into a bag. This keeps your bladder empty during surgery.   An intravenous access tube (IV) will be placed in your arm.   Hair may be removed from your pubic area and your lower abdomen. This is to prevent infection in the incision site.   You may be given an antacid medicine to drink. This will prevent acid contents in your stomach from going into your lungs if you vomit during the surgery.   You may be given an antibiotic medicine to prevent infection.  PROCEDURE   You may be given medicine to numb the lower half of your body (regional anesthetic). If you were in labor, you may have already had an epidural in place which can be used in both labor and cesarean delivery. You may possibly be given medicine to make you sleep (general anesthetic) though this is not as common.   An incision will be made in your abdomen that extends to your uterus. There are 2 basic kinds of incisions:   The horizontal (transverse) incision. Horizontal incisions are used for most routine cesarean deliveries.   The vertical (up and down)  incision. This is less commonly used. This is most often reserved for women who have a serious complication (extreme prematurity) or under emergency situations.   The horizontal and vertical incisions may both be used at the same time. However, this is very uncommon.   Your baby will then be delivered.  AFTER THE PROCEDURE   If you were awake during the surgery, you will see your baby right away. If you were asleep, you will see your baby as soon as you are awake.   You may breastfeed your baby after surgery.   You may be able to get up and walk the same day as the surgery. If you need to stay in bed for a period of time, you will receive help to turn, cough, and take deep breaths after surgery. This helps prevent lung problems such as pneumonia.   Do not get out of bed alone the first time after surgery. You will need help getting out of bed until you are able to do this by  yourself.   You may be able to shower the day after your cesarean delivery. After the bandage (dressing) is taken off the incision site, a nurse will assist you to shower, if you like.   You will have pneumatic compressing hose placed on your feet or lower legs. These hose are used to prevent blood clots. When you are up and walking regularly, they will no longer be necessary.   Do not cross your legs when you sit.   Save any blood clots that you pass. If you pass a clot while on the toilet, do not flush it. Call for the nurse. Tell the nurse if you think you are bleeding too much or passing too many clots.   Start drinking liquids and eating food as directed by your caregiver. If your stomach is not ready, drinking and eating too soon can cause an increase in bloating and swelling of your intestine and abdomen. This is very uncomfortable.   You will be given medicine as needed. Let your caregivers know if you are hurting. They want you to be comfortable. You may also be given an antibiotic to prevent an infection.   Your IV will be taken out when you are drinking a reasonable amount of fluids. The Foley catheter is taken out when you are up and walking.   If your blood type is Rh negative and your baby's blood type is Rh positive, you will be given a shot of anti-D immune globulin. This shot prevents you from having Rh problems with a future pregnancy. You should get the shot even if you had your tubes tied (tubal ligation).   If you are allowed to take the baby for a walk, place the baby in the bassinet and push it. Do not carry your baby in your arms.  Document Released: 03/29/2005 Document Revised: 12/09/2010 Document Reviewed: 07/24/2010 [facility] Patient Information 2012 [place], [place].

## 2011-06-02 NOTE — Anesthesia Preprocedure Evaluation (Signed)
Anesthesia Evaluation  Patient identified by MRN, date of birth, ID band Patient awake    Reviewed: Allergy & Precautions, H&P , NPO status , Patient's Chart, lab work & pertinent test results  Airway Mallampati: II      Dental No notable dental hx.    Pulmonary neg pulmonary ROS,  clear to auscultation  Pulmonary exam normal       Cardiovascular Exercise Tolerance: Good neg cardio ROS regular Normal    Neuro/Psych Negative Neurological ROS  Negative Psych ROS   GI/Hepatic negative GI ROS, Neg liver ROS,   Endo/Other  Negative Endocrine ROS  Renal/GU negative Renal ROS  Genitourinary negative   Musculoskeletal   Abdominal Normal abdominal exam  (+)   Peds  Hematology negative hematology ROS (+)   Anesthesia Other Findings   Reproductive/Obstetrics (+) Pregnancy                           Anesthesia Physical Anesthesia Plan  ASA: II and Emergent  Anesthesia Plan: Spinal   Post-op Pain Management:    Induction:   Airway Management Planned:   Additional Equipment:   Intra-op Plan:   Post-operative Plan:   Informed Consent: I have reviewed the patients History and Physical, chart, labs and discussed the procedure including the risks, benefits and alternatives for the proposed anesthesia with the patient or authorized representative who has indicated his/her understanding and acceptance.     Plan Discussed with: Anesthesiologist, CRNA and Surgeon  Anesthesia Plan Comments:         Anesthesia Quick Evaluation  

## 2011-06-02 NOTE — Progress Notes (Signed)
Pt G4 P2 at 39.3wks previous C/S x 2.  Having abd pain that comes and goes since 1800.

## 2011-06-02 NOTE — Progress Notes (Signed)
C/S scheduled 06/04/11.

## 2011-06-02 NOTE — H&P (Signed)
Betty Cain is a 36 y.o. G45P3002  female at [redacted]w[redacted]d presenting for uc's beginning at 1800 and leaking clear fluid since 1900.  Just moved here from [place] approximately 3wks ago and has been receiving care at [facility] since arrival.  Did have prenatal care in [place], but records were unavailable.  Pregnancy has been uneventful per clinic records.  Too late for genetic screening.  Glucola normal per pt/clinic records. U/S on 1/28 @ 36.1wks showed cephalic presentation, wt of 6lb 8oz (72%), and no gross anomalies.  1st pregnancy was term SVD, infant died at ~2hrs of age from 'choking on liquid', then had 2 subsequent term c/s.  Speaks minimal English.    Maternal Medical History:  Reason for admission: Reason for admission: rupture of membranes and contractions.  Contractions: Onset was 3-5 hours ago.   Frequency: regular.   Perceived severity is moderate.    Fetal activity: Perceived fetal activity is normal.   Last perceived fetal movement was within the past hour.    Prenatal complications: no prenatal complications Moved here from [place] approximately 3wks ago, had care there, but records unavailable- no complications per clinic records  Prenatal Complications - Diabetes: none.    OB History    Grav Para Term Preterm Abortions TAB SAB Ect Mult Living   4 3 3       2      Past Medical History  Diagnosis Date  . Anemia   . Neonatal death     Vaginal delivery, full term-lived x2 hours.    Past Surgical History  Procedure Date  . Cesarean section   . Adenoidectomy     as a child  . Boil 2003    right elbow   Family History: family history includes Diabetes in her father, mother, and sister.  There is no history of Anesthesia problems. Social History:  reports that she has never smoked. She has never used smokeless tobacco. She reports that she does not drink alcohol or use illicit drugs.  Review of Systems  Constitutional: Negative.   HENT: Negative.   Eyes:  Negative.   Respiratory: Negative.   Cardiovascular: Negative.   Gastrointestinal: Positive for abdominal pain (UCs).  Genitourinary:       Leaking clear fluid since 1900  Musculoskeletal: Negative.   Skin: Negative.   Neurological: Negative.   Endo/Heme/Allergies: Negative.   Psychiatric/Behavioral: Negative.       Blood pressure 134/93, pulse 84, temperature 99.3 F (37.4 C), temperature source Oral, resp. rate 18, height 4\' 10"  (1.473 m), weight 68.04 kg (150 lb). Maternal Exam:  Uterine Assessment: Contraction strength is moderate.  Contraction frequency is regular.   Abdomen: Patient reports no abdominal tenderness. Surgical scars: low transverse.   Fetal presentation: vertex  Introitus: Normal vulva. Amniotic fluid character: clear.  Cervix: Cervix evaluated by sterile speculum exam and digital exam.     Fetal Exam Fetal Monitor Review: Mode: ultrasound.   Baseline rate: 140.  Variability: moderate (6-25 bpm).   Pattern: accelerations present and early decelerations.    Fetal State Assessment: Category I - tracings are normal.     Physical Exam  Constitutional: She is oriented to person, place, and time. She appears well-developed and well-nourished.  HENT:  Head: Normocephalic.  Eyes: Pupils are equal, round, and reactive to light.  Neck: Normal range of motion.  Cardiovascular: Normal rate and regular rhythm.   Respiratory: Effort normal and breath sounds normal.  GI: Soft.  Gravid  Genitourinary: Uterus normal.       + pooling upon sse  Musculoskeletal: Normal range of motion.  Neurological: She is alert and oriented to person, place, and time. She has normal reflexes.  Skin: Skin is warm and dry.  Psychiatric: She has a normal mood and affect. Her behavior is normal. Judgment and thought content normal.    Prenatal labs: ABO, Rh:   not seen Antibody:   not seen Rubella: 127.3 (01/28 1213) RPR: NON REACTIVE (01/28 1213)  HBsAg: NEGATIVE (01/28  1213)  HIV: Non-reactive (01/28 0000)  GBS:   Negative  Assessment/Plan: A: IUP @ 39.3wks     SROM w/ early labor     Prior c/s x 2, for repeat (was scheduled for 2/22)     GBS neg     FHR Cat I     Stable maternal/fetal unit  P: Admit     Order type & screen (not available in records/results)     RLTCS  Cathie Beams, CNM present for exam Case reviewed w/ Dr. Kerri Perches, SNM 06/02/2011, 9:52 PM

## 2011-06-02 NOTE — H&P (Signed)
Patient interviewed, and history reviewed.  Translator line used for interview and informed consent by patient, with husband and RN present thru discussion.  Patient agrees to recommended repeat cesarean tonight , under planned spinal anesthesia. Risks of procedure reviewed to patient satisfaction. To OR, tentatively at 23;30

## 2011-06-02 NOTE — Anesthesia Procedure Notes (Signed)
Spinal  Patient location during procedure: OR Start time: 06/02/2011 11:51 PM Staffing Anesthesiologist: Brayton Caves R Performed by: anesthesiologist  Preanesthetic Checklist Completed: patient identified, site marked, surgical consent, pre-op evaluation, timeout performed, IV checked, risks and benefits discussed and monitors and equipment checked Spinal Block Patient position: sitting Prep: DuraPrep Patient monitoring: heart rate, cardiac monitor, continuous pulse ox and blood pressure Approach: midline Location: L3-4 Injection technique: single-shot Needle Needle type: Sprotte  Needle gauge: 24 G Needle length: 9 cm Assessment Sensory level: T4 Additional Notes Patient identified.  Risk benefits discussed including failed block, incomplete pain control, headache, nerve damage, paralysis, blood pressure changes, nausea, vomiting, reactions to medication both toxic or allergic, and postpartum back pain.  Patient expressed understanding and wished to proceed.  All questions were answered.  Sterile technique used throughout procedure.  CSF was clear.  No parasthesia or other complications.  Please see nursing notes for vital signs.

## 2011-06-03 ENCOUNTER — Encounter (HOSPITAL_COMMUNITY): Payer: Self-pay | Admitting: *Deleted

## 2011-06-03 ENCOUNTER — Inpatient Hospital Stay (HOSPITAL_COMMUNITY): Admission: RE | Admit: 2011-06-03 | Payer: Self-pay | Source: Ambulatory Visit

## 2011-06-03 LAB — CBC
MCH: 29.7 pg (ref 26.0–34.0)
MCHC: 33.3 g/dL (ref 30.0–36.0)
MCV: 89.1 fL (ref 78.0–100.0)
Platelets: 189 10*3/uL (ref 150–400)
RDW: 14.9 % (ref 11.5–15.5)

## 2011-06-03 LAB — RPR: RPR Ser Ql: NONREACTIVE

## 2011-06-03 LAB — TYPE AND SCREEN: Antibody Screen: NEGATIVE

## 2011-06-03 MED ORDER — TETANUS-DIPHTH-ACELL PERTUSSIS 5-2.5-18.5 LF-MCG/0.5 IM SUSP: 0.5000 mL | Freq: Once | INTRAMUSCULAR | Status: DC

## 2011-06-03 MED ORDER — DIBUCAINE 1 % RE OINT: 1.0000 "application " | TOPICAL_OINTMENT | RECTAL | Status: DC | PRN

## 2011-06-03 MED ORDER — MENTHOL 3 MG MT LOZG: 1.0000 | LOZENGE | OROMUCOSAL | Status: DC | PRN

## 2011-06-03 MED ORDER — OXYTOCIN 20 UNITS IN LACTATED RINGERS INFUSION - SIMPLE: 125.0000 mL/h | INTRAVENOUS | Status: AC

## 2011-06-03 MED ORDER — WITCH HAZEL-GLYCERIN EX PADS: 1.0000 "application " | MEDICATED_PAD | CUTANEOUS | Status: DC | PRN

## 2011-06-03 MED ORDER — DIPHENHYDRAMINE HCL 25 MG PO CAPS: 25.0000 mg | ORAL_CAPSULE | Freq: Four times a day (QID) | ORAL | Status: DC | PRN

## 2011-06-03 MED ORDER — SCOPOLAMINE 1 MG/3DAYS TD PT72
MEDICATED_PATCH | TRANSDERMAL | Status: AC
  Filled 2011-06-03: qty 1

## 2011-06-03 MED ORDER — KETOROLAC TROMETHAMINE 30 MG/ML IJ SOLN
INTRAMUSCULAR | Status: AC
  Filled 2011-06-03: qty 1

## 2011-06-03 MED ORDER — PRENATAL MULTIVITAMIN CH
1.0000 | ORAL_TABLET | Freq: Every day | ORAL | Status: DC
  Administered 2011-06-03 – 2011-06-05 (×3): 1 via ORAL
  Filled 2011-06-03 (×3): qty 1

## 2011-06-03 MED ORDER — OXYCODONE-ACETAMINOPHEN 5-325 MG PO TABS
1.0000 | ORAL_TABLET | ORAL | Status: DC | PRN
  Administered 2011-06-03 – 2011-06-05 (×8): 1 via ORAL
  Filled 2011-06-03 (×8): qty 1

## 2011-06-03 MED ORDER — ONDANSETRON HCL 4 MG/2ML IJ SOLN: 4.0000 mg | INTRAMUSCULAR | Status: DC | PRN

## 2011-06-03 MED ORDER — DIPHENHYDRAMINE HCL 50 MG/ML IJ SOLN
INTRAMUSCULAR | Status: DC | PRN
  Administered 2011-06-03: 50 mg via INTRAVENOUS

## 2011-06-03 MED ORDER — ONDANSETRON HCL 4 MG/2ML IJ SOLN
INTRAMUSCULAR | Status: DC | PRN
  Administered 2011-06-03: 4 mg via INTRAVENOUS

## 2011-06-03 MED ORDER — ONDANSETRON HCL 4 MG PO TABS: 4.0000 mg | ORAL_TABLET | ORAL | Status: DC | PRN

## 2011-06-03 MED ORDER — LACTATED RINGERS IV SOLN: INTRAVENOUS | Status: DC

## 2011-06-03 MED ORDER — IBUPROFEN 600 MG PO TABS
600.0000 mg | ORAL_TABLET | Freq: Four times a day (QID) | ORAL | Status: DC
  Administered 2011-06-03 – 2011-06-05 (×9): 600 mg via ORAL
  Filled 2011-06-03 (×3): qty 1
  Filled 2011-06-03: qty 2
  Filled 2011-06-03 (×4): qty 1

## 2011-06-03 MED ORDER — LACTATED RINGERS IV SOLN
INTRAVENOUS | Status: DC
  Administered 2011-06-03: 10:00:00 via INTRAVENOUS

## 2011-06-03 MED ORDER — SIMETHICONE 80 MG PO CHEW: 80.0000 mg | CHEWABLE_TABLET | ORAL | Status: DC | PRN

## 2011-06-03 MED ORDER — LANOLIN HYDROUS EX OINT: 1.0000 "application " | TOPICAL_OINTMENT | CUTANEOUS | Status: DC | PRN

## 2011-06-03 MED ORDER — OXYTOCIN 10 UNIT/ML IJ SOLN
INTRAMUSCULAR | Status: AC
  Filled 2011-06-03: qty 4

## 2011-06-03 MED ORDER — SENNOSIDES-DOCUSATE SODIUM 8.6-50 MG PO TABS
2.0000 | ORAL_TABLET | Freq: Every day | ORAL | Status: DC
  Administered 2011-06-03 – 2011-06-04 (×2): 2 via ORAL

## 2011-06-03 MED ORDER — OXYTOCIN 20 UNITS IN LACTATED RINGERS INFUSION - SIMPLE
INTRAVENOUS | Status: DC | PRN
  Administered 2011-06-03 (×2): 20 [IU] via INTRAVENOUS

## 2011-06-03 MED ORDER — SIMETHICONE 80 MG PO CHEW
80.0000 mg | CHEWABLE_TABLET | Freq: Three times a day (TID) | ORAL | Status: DC
  Administered 2011-06-03 – 2011-06-05 (×8): 80 mg via ORAL

## 2011-06-03 MED ORDER — CEFAZOLIN SODIUM-DEXTROSE 2-3 GM-% IV SOLR: 2.0000 g | INTRAVENOUS | Status: DC

## 2011-06-03 MED ORDER — LACTATED RINGERS IV SOLN
INTRAVENOUS | Status: DC | PRN
  Administered 2011-06-02: via INTRAVENOUS

## 2011-06-03 MED ORDER — MEPERIDINE HCL 25 MG/ML IJ SOLN
INTRAMUSCULAR | Status: AC
  Filled 2011-06-03: qty 1

## 2011-06-03 MED ORDER — ZOLPIDEM TARTRATE 5 MG PO TABS: 5.0000 mg | ORAL_TABLET | Freq: Every evening | ORAL | Status: DC | PRN

## 2011-06-03 MED ORDER — ONDANSETRON HCL 4 MG/2ML IJ SOLN
INTRAMUSCULAR | Status: AC
  Filled 2011-06-03: qty 2

## 2011-06-03 NOTE — Addendum Note (Signed)
Addendum  created 06/03/11 1727 by Cephus Shelling, CRNA   Modules edited:Notes Section

## 2011-06-03 NOTE — Anesthesia Postprocedure Evaluation (Signed)
Anesthesia Post Note  Patient: Betty Cain  Procedure(s) Performed: Procedure(s) (LRB): CESAREAN SECTION (N/A)  Anesthesia type: Spinal  Patient location: PACU  Post pain: Pain level controlled  Post assessment: Post-op Vital signs reviewed  Last Vitals:  Filed Vitals:   06/03/11 0100  BP: 110/80  Pulse: 84  Temp:   Resp: 20    Post vital signs: Reviewed  Level of consciousness: awake  Complications: No apparent anesthesia complications

## 2011-06-03 NOTE — Progress Notes (Signed)
UR Chart review completed.  

## 2011-06-03 NOTE — Op Note (Signed)
See dictated operative note included in brief of note and notable for fibrosis from the posterior surface of the rectus muscles to the anterior lower uterine segment of the uterus

## 2011-06-03 NOTE — Anesthesia Postprocedure Evaluation (Signed)
  Anesthesia Post Note  Patient: Betty Cain  Procedure(s) Performed: Procedure(s) (LRB): CESAREAN SECTION (N/A)  Anesthesia type: Spinal  Patient location: Mother/Baby  Post pain: Pain level controlled  Post assessment: Post-op Vital signs reviewed  Last Vitals:  Filed Vitals:   06/03/11 1337  BP: 110/66  Pulse: 81  Temp: 37.4 C  Resp: 20    Post vital signs: Reviewed  Level of consciousness: awake  Complications: No apparent anesthesia complications

## 2011-06-03 NOTE — Progress Notes (Signed)
PSYCHOSOCIAL ASSESSMENT ~ MATERNAL/CHILD  Name: Betty Cain Age: 36  Referral Date: 06/03/11  Reason/Source: NPNC in [place] / [place]  I. FAMILY/HOME ENVIRONMENT  A. Child's Legal Guardian _X__Parent(s) ___Grandparent ___Foster parent ___DSS_________________  Name: Betty Cain DOB: // Age: 35  Address: [address] [place], [place] [address]  Name: Betty Cain DOB: [birth year] Age:  Address:  B. Other Household Members/Support Persons Name: Relationship: son, 7yr DOB ___/___/___  Name: Relationship: son, 5yr DOB ___/___/___  Name: Relationship: sister DOB ___/___/___  Name: Relationship: DOB ___/___/___  C. Other Support:  II. PSYCHOSOCIAL DATA A. Information Source _X_Patient Interview __Family Interview __Other___________ B. Financial and Community Resources __Employment:  __Medicaid County: __Private Insurance: _X_Self Pay  __Food Stamps __WIC __Work First __Public Housing __Section 8  __Maternity Care Coordination/Child Service Coordination/Early Intervention  ___School: Grade:  __Other:  C. Cultural and Environment Information Cultural Issues Impacting Care:  III. STRENGTHS _X__Supportive family/friends  _X__Adequate Resources  ___Compliance with medical plan  _X__Home prepared for Child (including basic supplies)  ___Understanding of illness  ___Other:  RISK FACTORS AND CURRENT PROBLEMS ____No Problems Noted  NPNC  IV. SOCIAL WORK ASSESSMENT Pt told Sw that she started [facility] in 2nd month of pregnancy in [place]. She attended monthly visits before she and her family came to the area 3 weeks ago. Pt was unable to tell this Sw how long they will be here, stating "it depends on husbands work." She denies illegal substance use. UDS is negative, meconium results are pending. She has supplies for the infant but expressed need for more formula. Sw gave the pt information about WIC and encouraged them to apply. They are staying with her sister at this time. Sw will follow up  with drug screen results and make a referral if needed.  V. SOCIAL WORK PLAN _X__No Further Intervention Required/No Barriers to Discharge  ___Psychosocial Support and Ongoing Assessment of Needs  ___Patient/Family Education:  ___Child Protective Services Report County___________ Date___/____/____  ___Information/Referral to Community Resources_________________________  ___Other:    _X_Patient Interview  __Family Interview           __Other___________  B. Event organiser __Employment: __Medicaid    [place]:                 __Private Insurance:                   _X_Self Pay  __Food Stamps   __WIC __Work First     __Public Housing     __Section 8    __Maternity Care Coordination/Child Service Coordination/Early Intervention   ___School:                                                                         Grade:  __Other:   [name] Cultural and Environment Information Cultural Issues Impacting Care:  III. STRENGTHS _X__Supportive family/friends _X__Adequate Resources ___Compliance with medical plan _X__Home prepared for Child (including basic  supplies) ___Understanding of illness      ___Other: RISK FACTORS AND CURRENT PROBLEMS         ____No Problems Noted       NPNC                                                                                                                                                                                                                                              IV. SOCIAL WORK ASSESSMENT  Pt told Sw that she started [facility] in 2nd month of pregnancy in [place].  She attended monthly visits before she and her family came to the area 3 weeks ago.  Pt was unable to tell this Sw how long they will be here, stating "it depends on husbands work."  She denies illegal substance use.  UDS is negative, meconium results are pending.  She has supplies for the infant but expressed need for more formula.  Sw gave the pt information about [facility] and encouraged them to apply.  They are staying with her sister at this time.  Sw will follow up with drug screen results and make a referral if needed.    V. SOCIAL WORK  PLAN  _X__No Further Intervention Required/No Barriers to Discharge   ___Psychosocial Support and Ongoing Assessment of Needs   ___Patient/Family Education:   ___Child Protective Services Report   County___________ Date___/____/____   ___Information/Referral to [employer]_________________________   ___Other:

## 2011-06-03 NOTE — Transfer of Care (Signed)
Immediate Anesthesia Transfer of Care Note  Patient: Betty Cain  Procedure(s) Performed: Procedure(s) (LRB): CESAREAN SECTION (N/A)  Patient Location: PACU  Anesthesia Type: Spinal  Level of Consciousness: awake, alert  and oriented  Airway & Oxygen Therapy: Patient Spontanous Breathing  Post-op Assessment: Report given to PACU RN and Post -op Vital signs reviewed and stable  Post vital signs: Reviewed and stable  Complications: No apparent anesthesia complications

## 2011-06-03 NOTE — Progress Notes (Signed)
Requested to attend elective repeat C/S after onset of labor at 39+ weeks. No reported risk factors. History of clear fluid at ROM but at delivery fluid meconium stained. Infant manually extracted from vertex presentation with spontaneous cries and active tone observed. Laryngoscopy deferred.    Infant given vigorous tactile stimulation and bulb  Suction of naso/oropharynx with minimal stained mucus obtained.  No dysmorphic features but with meconium staining observed. Apgar scores 9/9.  Care to assigned pediatrician and to RN from Transitional Nursery attending in the OR>    Betty Cain. Alphonsa Gin MD [facility]

## 2011-06-03 NOTE — Brief Op Note (Signed)
06/02/2011 - 06/03/2011  12:39 AM  PATIENT:  Betty Cain  36 y.o. 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  PROCEDURE:  Procedure(s) (LRB): CESAREAN SECTION (N/A)repeat low transverse  SURGEON:  Surgeon(s) and Role:    * Tilda Burrow, MD - Primary  PHYSICIAN ASSISTANT: none  ASSISTANTS: none   ANESTHESIA:   spinal  EBL:     BLOOD ADMINISTERED:none  DRAINS: Urinary Catheter (Foley)  Findings extensive fibrosis from the backside of the rectus muscles to the anterior wall of the uterus in the lower uterine segment area LOCAL MEDICATIONS USED:  NONE  SPECIMEN:  Source of Specimen:  placenta to L& D  DISPOSITION OF SPECIMEN:  L&D  COUNTS:  YES  TOURNIQUET:  * No tourniquets in log *  DICTATION: .Dragon Dictation surgery was begun after after timeout conducted and Ancef administered intravenously. Spinal anesthesia was tested confirmed as adequate, and the old Pfannenstiel incision repeated with excision of the 2 prior C-section skin scars. The subcutaneous tissues were quite fibrotic and the fascia was extremely thick and will scarred to the backside  of the muscles. Which could be open in the midline with some difficulty. It Was found that they were attached to the anterior wall of the uterus and with some difficulty could be dissected from the wall of the uterus sufficiently to identify a site on the anterior lower uterine segment where transverse incision to be made and the fetal vertex guided through the incision without difficulty cord was clamped. Light meconium was noted in the fluid. Infant was passed to the waiting pediatrician Dr. Alison Murray, and the placenta delivered intact. Some membranes were extracted and uterus swabbed clean. Glare running locking closure the uterine incision was performed. There were some lacerations  above the uterine incision from the myometrial attachments to the fascia these required separate suturing to the incision in such a way as to restore anatomy as much as possible the omentum was attached in this area as well. Some effort was made to reapproximate the anterior peritoneum in such a way to minimize the area of fibrosis and adhesions. The omentum was left in the uterine fundus and the anterior wall as much as possible. Urine Remained clear and without blood. Fascia was closed with running 0 Vicryl. The subcutaneous tissues were reapproximated using 2 sutures of 2-0 Vicryl, then subcuticular 4-0 Vicryl on a Keith needle used to close the skin. Sponge and needle counts were correct. EBL was 650 cc to recovery room good   PLAN OF CARE: Admit to inpatient   PATIENT DISPOSITION:  PACU - hemodynamically stable.   Delay start of Pharmacological VTE agent (>24hrs) due to surgical blood loss or risk of bleeding: not applicable

## 2011-06-04 ENCOUNTER — Encounter (HOSPITAL_COMMUNITY): Admission: RE | Payer: Self-pay | Source: Ambulatory Visit

## 2011-06-04 ENCOUNTER — Inpatient Hospital Stay (HOSPITAL_COMMUNITY): Admission: RE | Admit: 2011-06-04 | Payer: Self-pay | Source: Ambulatory Visit | Admitting: Obstetrics & Gynecology

## 2011-06-04 ENCOUNTER — Encounter (HOSPITAL_COMMUNITY): Payer: Self-pay | Admitting: Obstetrics and Gynecology

## 2011-06-04 SURGERY — Surgical Case
Anesthesia: Regional | Site: Abdomen

## 2011-06-04 NOTE — Progress Notes (Signed)
Subjective: Postpartum Day 2: Cesarean Delivery Patient reports incisional pain, tolerating PO, + flatus and no problems voiding.    Objective: Vital signs in last 24 hours: Temp:  [98 F (36.7 C)-99.4 F (37.4 C)] 98 F (36.7 C) (02/22 0625) Pulse Rate:  [81-94] 88  (02/22 0625) Resp:  [16-20] 20  (02/22 0625) BP: (99-119)/(63-81) 99/67 mmHg (02/22 0625) SpO2:  [97 %-100 %] 99 % (02/22 0230)  Physical Exam:  General: alert, cooperative and no distress Lochia: appropriate Uterine Fundus: firm Incision: no significant drainage DVT Evaluation: No evidence of DVT seen on physical exam.   Basename 06/03/11 0555 06/02/11 2200  HGB 10.4* 12.7  HCT 31.2* 38.2    Assessment/Plan: Status post Cesarean section. Doing well postoperatively.  Continue current care.  Plan for hospital discharge tomorrow.  LEFTWICH-KIRBY, Fabian Coca 06/04/2011, 7:18 AM

## 2011-06-05 MED ORDER — OXYCODONE-ACETAMINOPHEN 5-325 MG PO TABS: 1.0000 | ORAL_TABLET | ORAL | Status: AC | PRN

## 2011-06-05 MED ORDER — IBUPROFEN 600 MG PO TABS: 600.0000 mg | ORAL_TABLET | Freq: Four times a day (QID) | ORAL | Status: AC | PRN

## 2011-06-05 NOTE — Discharge Instructions (Signed)
Cesarean Delivery °Care After °Refer to this sheet in the next few weeks. These instructions provide you with information on caring for yourself after your procedure. Your caregiver may also give you more specific instructions. Your treatment has been planned according to current medical practices, but problems sometimes occur. Call your caregiver if you have any problems or questions after your procedure. °HOME CARE INSTRUCTIONS °Healing will take time. You will have discomfort, tenderness, swelling, and bruising at the surgery site for a couple of weeks. This is normal and will get better as time goes on. °Activity °· Rest as much as possible the first 2 weeks.  °· When possible, have someone help you with your household activities and your baby for 2 to 3 weeks.  °· Limit your housework and social activity. Increase your activity gradually as your strength returns.  °· Do not climb stairs more than 2 to 3 times a day.  °· Do not lift anything heavier than your baby.  °· Follow your caregiver's instructions about driving a car.  °· Exercise only as directed by your caregiver.  °Nutrition °· You may return to your usual diet. Eat a well-balanced diet.  °· Drink enough fluid to keep your urine clear or pale yellow.  °· Keep taking your prenatal or multivitamins.  °· Do not drink alcohol until your caregiver says it is okay.  °Elimination °You should return to your usual bowel function. If you develop constipation, ask your caregiver about taking a mild laxative that will help you go to the bathroom. Bran foods and fluids help with constipation. Gradually add fruit, vegetables, and bran to your diet.  °Hygiene °· You may shower, wash your hair, and take tub baths unless your caregiver tells you otherwise.  °· Continue perineal care until your vaginal bleeding and discharge stops.  °· Do not douche or use tampons until your caregiver says it is okay.  °Fever °If you feel feverish or have shaking chills, take your  temperature. The fever may indicate infection. Infections can be treated with antibiotic medicine. °Pain Control and Medicine °· Only take over-the-counter or prescription medicine as directed by your caregiver. Do not take aspirin. It can cause bleeding.  °· Do not drive when taking pain medicine.  °· Talk to your caregiver about restarting or adjusting your normal medicines.  °Incision Care °· Clean your cut (incision) gently with soap and water, then pat dry.  °· If your caregiver says it is okay, leave the incision without a bandage (dressing) unless it is draining fluid or irritated.  °· If you have small adhesive strips across the incision and they do not fall off within 7 days, carefully peel them off.  °· Check the incision daily for increased redness, drainage, swelling, or separation of skin.  °· Hug a pillow when coughing or sneezing. This helps to relieve pain.  °Vaginal Care °You may have a vaginal discharge or bleeding for up to 6 weeks. If the vaginal discharge becomes bright red, bad smelling, heavy in amount, has blood clots, or if you have burning or frequent urination, call your caregiver. If your bleeding slows down and then gets heavier, your body is telling you to slow down and relax more. °Sexual Intercourse °· Check with your caregiver before resuming sexual activity. Often, after 4 to 6 weeks, if you feel good and are well rested, sexual activity may be resumed. Avoid positions that strain the incision site.  °· You can become pregnant before you have a period.   If you decide to have sexual intercourse, use birth control if you do not want to become pregnant right away.  °Health Practices °· Keep all your postpartum appointments as recommended by your caregiver. Generally, your caregiver will want to see you in 2 to 3 weeks.  °· Continue with your yearly pelvic exams.  °· Continue monthly self-breast exams and yearly physical exams with a Pap test.  °Breast Care °· If you are not  breastfeeding and your breasts become tender, hard, or leak milk, you may wear a tight-fitting bra and apply ice to your breasts.  °· If you are breastfeeding, wear a good support bra.  °· Call your caregiver if you have breast pain, flu-like symptoms, fever, or hardness and reddening of your breasts.  °Postpartum Blues °You may have a period of low spirits or "blues" after your baby is born. Discuss your feelings with your partner, family, and friends. This may be caused by the changing hormone levels in your body. You may want to contact your caregiver if this is worrisome. °Miscellaneous °· Limit wearing support panties or control-top hose.  °· If you breastfeed, you may not have a period for several months or longer. This is normal for the nursing mother. If you do not menstruate within 6 weeks after you stop breastfeeding, see your caregiver.  °· If you are not breastfeeding, you can expect to menstruate within 6 to 10 weeks after birth. If you have not started by the 11th week, check with your caregiver.  °SEEK MEDICAL CARE IF:  °· There is swelling, redness, or increasing pain in the wound area.  °· You have pus coming from the wound.  °· You notice a bad smell from the wound or surgical dressing.  °· You have pain, redness, and swelling from the intravenous (IV) site.  °· Your wound breaks open (the edges are not staying together).  °· You feel dizzy or feel like fainting.  °· You develop pain or bleeding when you urinate.  °· You develop diarrhea.  °· You develop nausea and vomiting.  °· You develop abnormal vaginal discharge.  °· You develop a rash.  °· You have any type of abnormal reaction or develop an allergy to your medicine.  °· Your pain is not relieved by your medicine or becomes worse.  °· Your temperature is 101° F (38.3° C), or is 100.4° F (38° C) taken 2 times in a 4 hour period.  °SEEK IMMEDIATE MEDICAL CARE: °· You develop a temperature of 102° F (38.9° C) or higher.  °· You develop abdominal  pain.  °· You develop chest pain.  °· You develop shortness of breath.  °· You faint.  °· You develop pain, swelling, or redness of your leg.  °· You develop heavy vaginal bleeding with or without blood clots.  °Document Released: 12/19/2001 Document Revised: 12/09/2010 Document Reviewed: 06/24/2010 °ExitCare® Patient Information ©2012 [employer]. °

## 2011-06-05 NOTE — Discharge Summary (Signed)
Obstetric Discharge Summary Reason for Admission: cesarean section planned Prenatal Procedures: NST and ultrasound Intrapartum Procedures: cesarean: low cervical, transverse Postpartum Procedures: none Complications-Operative and Postpartum: none Hemoglobin  Date Value Range Status  06/03/2011 10.4* 12.0-15.0 (g/dL) Final     DELTA CHECK NOTED     REPEATED TO VERIFY     HCT  Date Value Range Status  06/03/2011 31.2* 36.0-46.0 (%) Final    Discharge Diagnoses: Term Pregnancy-delivered  Discharge Information: Date: 06/05/2011 Activity: per booklet Diet: routine Medications: PNV, ibuprofen, Percocet Condition: stable Instructions: refer to practice specific booklet Discharge to: home   Newborn Data: Live born female  Birth Weight: 6 lb 1.2 oz (2755 g) APGAR: 9, 9  Home with mother F/U at [facility] on 06/15/11. Abstain from intercourse until then. Will start OCPS at 4-6 wks..  Betty Cain 06/05/2011, 9:45 AM

## 2011-06-05 NOTE — Progress Notes (Signed)
Subjective: Postpartum Day 2: Cesarean Delivery Patient reports incisional pain, tolerating PO, + flatus and no problems voiding.  Has H/A. Left nipple slightly excoriated, using lanolin.  Declines discharge today. Plans OCPs. Objective: Vital signs in last 24 hours: Temp:  [98 F (36.7 C)-98.8 F (37.1 C)] 98 F (36.7 C) (02/23 0531) Pulse Rate:  [81-87] 87  (02/23 0531) Resp:  [18-20] 18  (02/23 0531) BP: (103-114)/(69-80) 109/72 mmHg (02/23 0531) SpO2:  [100 %] 100 % (02/22 2122)  Physical Exam:  General: alert, cooperative, appears stated age and mildly obese Lochia: appropriate Uterine Fundus: firm at U, moderately TTP. Incision: healing well. Steri strips intact, no drainage.  DVT Evaluation: No evidence of DVT seen on physical exam.   Basename 06/03/11 0555 06/02/11 2200  HGB 10.4* 12.7  HCT 31.2* 38.2    Assessment/Plan: Status post Cesarean section. Doing well postoperatively.  Continue current care Discaharge in AM. .  Keiko Myricks 06/05/2011, 9:32 AM

## 2011-06-05 NOTE — Progress Notes (Signed)
Addendum: C/S was just after midnight on 06/03/11 and baby is ready for discharge.  [employer] used: Will discharge today and F/U at [facility] in 2 weeks. Has appointment 06/14/11.

## 2011-06-06 ENCOUNTER — Inpatient Hospital Stay (HOSPITAL_COMMUNITY)
Admission: AD | Admit: 2011-06-06 | Discharge: 2011-06-08 | DRG: 776 | Disposition: A | Discharge status: Home / Self Care | Payer: Medicaid Other | Source: Ambulatory Visit | Attending: Obstetrics & Gynecology | Admitting: Obstetrics & Gynecology

## 2011-06-06 ENCOUNTER — Encounter (HOSPITAL_COMMUNITY): Payer: Self-pay | Admitting: Obstetrics and Gynecology

## 2011-06-06 ENCOUNTER — Encounter (HOSPITAL_COMMUNITY): Payer: Self-pay | Admitting: Anesthesiology

## 2011-06-06 DIAGNOSIS — O8612 Endometritis following delivery: Principal | ICD-10-CM | POA: Diagnosis present

## 2011-06-06 HISTORY — DX: Other reaction to spinal and lumbar puncture: G97.1

## 2011-06-06 LAB — URINALYSIS, ROUTINE W REFLEX MICROSCOPIC
Bilirubin Urine: NEGATIVE
Nitrite: NEGATIVE
Specific Gravity, Urine: <1.005 — ABNORMAL LOW (ref 1.005–1.030)
Urobilinogen, UA: 0.2 mg/dL (ref 0.0–1.0)

## 2011-06-06 LAB — CBC
MCHC: 32.4 g/dL (ref 30.0–36.0)
Platelets: 264 10*3/uL (ref 150–400)
RDW: 15.2 % (ref 11.5–15.5)

## 2011-06-06 LAB — DIFFERENTIAL
Basophils Absolute: 0 10*3/uL (ref 0.0–0.1)
Basophils Relative: 0 % (ref 0–1)
Monocytes Absolute: 0.3 10*3/uL (ref 0.1–1.0)
Neutro Abs: 7 10*3/uL (ref 1.7–7.7)
Neutrophils Relative %: 80 % — ABNORMAL HIGH (ref 43–77)

## 2011-06-06 LAB — URINE MICROSCOPIC-ADD ON

## 2011-06-06 MED ORDER — KCL IN DEXTROSE-NACL 20-5-0.45 MEQ/L-%-% IV SOLN
INTRAVENOUS | Status: DC
  Administered 2011-06-06 – 2011-06-08 (×5): via INTRAVENOUS
  Filled 2011-06-06 (×9): qty 1000

## 2011-06-06 MED ORDER — ACETAMINOPHEN 650 MG RE SUPP: 650.0000 mg | Freq: Four times a day (QID) | RECTAL | Status: DC | PRN

## 2011-06-06 MED ORDER — PIPERACILLIN-TAZOBACTAM 3.375 G IVPB 30 MIN: 3.3750 g | Freq: Three times a day (TID) | INTRAVENOUS | Status: DC

## 2011-06-06 MED ORDER — ACETAMINOPHEN 325 MG PO TABS: 650.0000 mg | ORAL_TABLET | Freq: Four times a day (QID) | ORAL | Status: DC | PRN

## 2011-06-06 MED ORDER — PIPERACILLIN-TAZOBACTAM 3.375 G IVPB
3.3750 g | Freq: Three times a day (TID) | INTRAVENOUS | Status: DC
  Administered 2011-06-06 – 2011-06-08 (×6): 3.375 g via INTRAVENOUS
  Filled 2011-06-06 (×9): qty 50

## 2011-06-06 MED ORDER — ALUM & MAG HYDROXIDE-SIMETH 200-200-20 MG/5ML PO SUSP: 30.0000 mL | Freq: Four times a day (QID) | ORAL | Status: DC | PRN

## 2011-06-06 MED ORDER — OXYCODONE HCL 5 MG PO TABS
5.0000 mg | ORAL_TABLET | ORAL | Status: DC | PRN
  Administered 2011-06-07 (×4): 5 mg via ORAL
  Filled 2011-06-06 (×5): qty 1

## 2011-06-06 MED ORDER — BUTALBITAL-APAP-CAFFEINE 50-325-40 MG PO TABS
2.0000 | ORAL_TABLET | Freq: Three times a day (TID) | ORAL | Status: DC
  Administered 2011-06-06 – 2011-06-07 (×6): 2 via ORAL
  Filled 2011-06-06 (×6): qty 2

## 2011-06-06 MED ORDER — PIPERACILLIN-TAZOBACTAM 3.375 G IVPB 30 MIN
3.3750 g | Freq: Three times a day (TID) | INTRAVENOUS | Status: DC
  Filled 2011-06-06: qty 50

## 2011-06-06 MED ORDER — LACTATED RINGERS IV SOLN
Freq: Once | INTRAVENOUS | Status: AC
  Administered 2011-06-06: 10:00:00 via INTRAVENOUS

## 2011-06-06 MED ORDER — ONDANSETRON 8 MG/NS 50 ML IVPB
8.0000 mg | Freq: Four times a day (QID) | INTRAVENOUS | Status: DC | PRN
  Filled 2011-06-06: qty 8

## 2011-06-06 MED ORDER — ONDANSETRON HCL 4 MG PO TABS: 8.0000 mg | ORAL_TABLET | Freq: Four times a day (QID) | ORAL | Status: DC | PRN

## 2011-06-06 MED ORDER — DOCUSATE SODIUM 100 MG PO CAPS
100.0000 mg | ORAL_CAPSULE | Freq: Two times a day (BID) | ORAL | Status: DC | PRN
  Administered 2011-06-06 – 2011-06-07 (×3): 100 mg via ORAL
  Filled 2011-06-06 (×3): qty 1

## 2011-06-06 NOTE — H&P (Signed)
Chief Complaint   Patient presents with   .  Headache   HPI  This is a 36 y.o. who is 4 days post op from C/S who presents with c/o headache, low back pain, vomiting and fever. States had BM today. No dysuria reported. States breastfeeding well. States headache is worse when up.  OB History    Grav  Para  Term  Preterm  Abortions  TAB  SAB  Ect  Mult  Living    4  4  4        3       Past Medical History   Diagnosis  Date   .  Anemia    .  Neonatal death      Vaginal delivery, full term-lived x2 hours.   Marland Kitchen  Spinal headache     Past Surgical History   Procedure  Date   .  Cesarean section    .  Adenoidectomy      as a child   .  Boil  2003     right elbow   .  Cesarean section  06/02/2011     Procedure: CESAREAN SECTION; Surgeon: Tilda Burrow, MD; Location: [facility]; Service: Gynecology; Laterality: N/A; Primary Cesarean Section Delivery Baby Boy @ 0004, Apgars 9/9    Family History   Problem  Relation  Age of Onset   .  Anesthesia problems  Neg Hx    .  Diabetes  Mother    .  Diabetes  Father    .  Diabetes  Sister     History   Substance Use Topics   .  Smoking status:  Never Smoker   .  Smokeless tobacco:  Never Used   .  Alcohol Use:  No   Allergies: No Known Allergies  Prescriptions prior to admission   Medication  Sig  Dispense  Refill   .  ibuprofen (ADVIL,MOTRIN) 600 MG tablet  Take 1 tablet (600 mg total) by mouth every 6 (six) hours as needed for pain.  30 tablet  1   .  oxyCODONE-acetaminophen (PERCOCET) 5-325 MG per tablet  Take 1 tablet by mouth every 4 (four) hours as needed for pain.  20 tablet  0   .  Prenatal Vit-Fe Fumarate-FA (PRENATAL MULTIVITAMIN) 60-1 MG tablet  Take 1 tablet by mouth daily.  90 tablet  3   ROS  As above  Physical Exam   Blood pressure 112/72, pulse 94, temperature 101.1 F (38.4 C), temperature source Oral, resp. rate 18, SpO2 100.00%, currently breastfeeding.  Physical Exam  Constitutional: She is oriented to person, place,  and time. She appears well-developed. No distress.  HENT:  Head: Normocephalic.  Neck: Normal range of motion. Neck supple.  Cardiovascular: Normal rate.  Respiratory: Effort normal.  GI: Soft. She exhibits distension. She exhibits no mass. There is tenderness (over incision). There is no rebound and no guarding.  Genitourinary: Vaginal discharge (small lochia) found.  Musculoskeletal: Normal range of motion. She exhibits no edema.  Neurological: She is alert and oriented to person, place, and time. She has normal reflexes.  Skin: Skin is warm and dry.  Psychiatric: She has a normal mood and affect.  Breasts slightly engorged.  MAU Course   Procedures  MDM  Consulted Dr Despina Hidden  CBC/diff  UA  Anesthesia consult  Assessment and Plan   Dr Despina Hidden in to see patient.  Will admit for IVF and give antibiotics for possible endometritis.    Betty Cain  H 06/06/2011 11:15 AM

## 2011-06-06 NOTE — ED Provider Notes (Signed)
History     Chief Complaint  Patient presents with  . Headache   HPI This is a 36 y.o. who is 4 days post op from C/S who presents with c/o headache, low back pain, vomiting and fever. States had BM today. No dysuria reported. States breastfeeding well. States headache is worse when up.   OB History    Grav Para Term Preterm Abortions TAB SAB Ect Mult Living   4 4 4       3       Past Medical History  Diagnosis Date  . Anemia   . Neonatal death     Vaginal delivery, full term-lived x2 hours.   Marland Kitchen Spinal headache     Past Surgical History  Procedure Date  . Cesarean section   . Adenoidectomy     as a child  . Boil 2003    right elbow  . Cesarean section 06/02/2011    Procedure: CESAREAN SECTION;  Surgeon: Tilda Burrow, MD;  Location: [facility];  Service: Gynecology;  Laterality: N/A;  Primary Cesarean Section Delivery Baby Boy @ 0004, Apgars 9/9    Family History  Problem Relation Age of Onset  . Anesthesia problems Neg Hx   . Diabetes Mother   . Diabetes Father   . Diabetes Sister     History  Substance Use Topics  . Smoking status: Never Smoker   . Smokeless tobacco: Never Used  . Alcohol Use: No    Allergies: No Known Allergies  Prescriptions prior to admission  Medication Sig Dispense Refill  . ibuprofen (ADVIL,MOTRIN) 600 MG tablet Take 1 tablet (600 mg total) by mouth every 6 (six) hours as needed for pain.  30 tablet  1  . oxyCODONE-acetaminophen (PERCOCET) 5-325 MG per tablet Take 1 tablet by mouth every 4 (four) hours as needed for pain.  20 tablet  0  . Prenatal Vit-Fe Fumarate-FA (PRENATAL MULTIVITAMIN) 60-1 MG tablet Take 1 tablet by mouth daily.  90 tablet  3    ROS As above  Physical Exam   Blood pressure 112/72, pulse 94, temperature 101.1 F (38.4 C), temperature source Oral, resp. rate 18, SpO2 100.00%, currently breastfeeding.  Physical Exam  Constitutional: She is oriented to person, place, and time. She appears well-developed. No  distress.  HENT:  Head: Normocephalic.  Neck: Normal range of motion. Neck supple.  Cardiovascular: Normal rate.   Respiratory: Effort normal.  GI: Soft. She exhibits distension. She exhibits no mass. There is tenderness (over incision). There is no rebound and no guarding.  Genitourinary: Vaginal discharge (small lochia) found.  Musculoskeletal: Normal range of motion. She exhibits no edema.  Neurological: She is alert and oriented to person, place, and time. She has normal reflexes.  Skin: Skin is warm and dry.  Psychiatric: She has a normal mood and affect.  Breasts slightly engorged.  MAU Course  Procedures  MDM Consulted Dr Despina Hidden CBC/diff UA Anesthesia consult  Assessment and Plan  Dr Despina Hidden in to see patient. Will admit for IVF and give antibiotics for possible endometritis.   [facility] [place] 06/06/2011, 8:50 AM

## 2011-06-06 NOTE — Progress Notes (Signed)
Dr. Rodman Pickle notified of patient presentation post C-section; 2/21, term infant, spinal- repeat c-section. Pt presents with headache 10/10, weakness in lower extremities, HA worse when patient changes positions. Dr. Rodman Pickle will be by to assess patient.

## 2011-06-06 NOTE — Consult Note (Addendum)
H: Asked by Dr Despina Hidden to see this patient with headache.  She is a 36 yo G4P4L3 admitted today with fever, headache, nausea, vomiting, and abdominal pain.  She had a cesarean section on Wednesday with spinal anesthesia for the delivery of her third baby.  She was discharged from the hospital on 2/23 and returned today to [facility]U with these complaints.  She reports onset of the headache was last night, husband says she has had headache off and on since delivery.  She says it is excruciating when standing and improves slightly with lying down. Husband reports HA was relieved with PO pain medications.  She also reports neck and lower back pain, and her husband notes that her feet and legs have swelled since discharge.  Husband reports she has had generalized weakness and tingling over the site where her spinal was placed.  She denies visual changes.  She denies bowel or bladder dysfunction.  P:  Blood pressure 130/82, pulse 90, temperature 36.9 C, temperature source Oral, resp. rate 18, weight 136 lb 1 oz (61.718 kg), SpO2 100.00%, currently breastfeeding.       EOM intact, pupils equal, round and reactive      Facial strength and sensation intact      Neck ROM is full and without pain      Lungs are CTAB      Heart is RRR      Motor strength and sensation is intact in upper and lower extremities bilaterally.  Can lift legs off of the bed.      Back shows no bruising, erythema, or swelling.  There is no TTP over spinous processes.      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  Discussed expected course and management with patient and husband.  Expect HA to resolve spontaneously within 7 days from placement of spinal.  Lying down when possible, increased PO and IV fluids, and PO fioricet PRN headache may help.

## 2011-06-06 NOTE — Progress Notes (Addendum)
Pt reports having a headache all night. Has had headache off and on since delivery on 2/20. She has taken hydrocodone without relief. Pt states headache is more intense when she stands up and hen she feels dizzy and having nimbness in her back. Had epidural pain management during delivery. Pt had repeat c-section.

## 2011-06-07 MED ORDER — WITCH HAZEL-GLYCERIN EX PADS
MEDICATED_PAD | CUTANEOUS | Status: DC | PRN
  Administered 2011-06-07: 01:00:00 via TOPICAL

## 2011-06-07 MED ORDER — DIBUCAINE 1 % RE OINT
TOPICAL_OINTMENT | RECTAL | Status: DC | PRN
  Filled 2011-06-07: qty 28

## 2011-06-07 NOTE — Discharge Summary (Signed)
Agree with above note.  Hurley Sobel 06/07/2011 11:54 AM   

## 2011-06-07 NOTE — Progress Notes (Signed)
Subjective: Postpartum Day 5: Cesarean Delivery Patient reports incisional pain, tolerating PO, + flatus and no problems voiding.    Objective: Vital signs in last 24 hours: Temp:  [97.9 F (36.6 C)-99.4 F (37.4 C)] 97.9 F (36.6 C) (02/25 0631) Pulse Rate:  [85-97] 90  (02/25 0631) Resp:  [18-20] 18  (02/25 0631) BP: (106-136)/(71-86) 112/81 mmHg (02/25 0631) SpO2:  [96 %-100 %] 97 % (02/25 0631) Weight:  [136 lb 1 oz (61.718 kg)] 136 lb 1 oz (61.718 kg) (02/24 1210)  Physical Exam:  General: alert, cooperative and no distress Lochia: appropriate Uterine Fundus: firm still tender over the fundus but less so Incision: healing well, no significant drainage DVT Evaluation: No evidence of DVT seen on physical exam.   Basename 06/06/11 0838  HGB 10.3*  HCT 31.8*    Assessment/Plan: Status post Cesarean section. 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  Anticipate discharge tomorow after another day of anitbiotics  Malachy Coleman H 06/07/2011, 7:40 AM

## 2011-06-08 MED ORDER — CLINDAMYCIN HCL 300 MG PO CAPS: 300.0000 mg | ORAL_CAPSULE | Freq: Three times a day (TID) | ORAL | Status: AC

## 2011-06-08 MED ORDER — BUTALBITAL-APAP-CAFFEINE 50-325-40 MG PO TABS: 2.0000 | ORAL_TABLET | Freq: Three times a day (TID) | ORAL | Status: AC

## 2011-06-08 NOTE — Discharge Instructions (Signed)
Cesarean Delivery  °Cesarean delivery is the birth of a baby through a cut (incision) in the abdomen and womb (uterus).  °LET YOUR CAREGIVER KNOW ABOUT: °· Complications involving the pregnancy.  °· Allergies.  °· Medicines taken including herbs, eyedrops, over-the-counter medicines, and creams.  °· Use of steroids (by mouth or creams).  °· Previous problems with anesthetics or numbing medicine.  °· Previous surgery.  °· History of blood clots.  °· History of bleeding or blood problems.  °· Other health problems.  °RISKS AND COMPLICATIONS  °· Bleeding.  °· Infection.  °· Blood clots.  °· Injury to surrounding organs.  °· Anesthesia problems.  °· Injury to the baby.  °BEFORE THE PROCEDURE  °· A tube (Foley catheter) will be placed in your bladder. The Foley catheter drains the urine from your bladder into a bag. This keeps your bladder empty during surgery.  °· An intravenous access tube (IV) will be placed in your arm.  °· Hair may be removed from your pubic area and your lower abdomen. This is to prevent infection in the incision site.  °· You may be given an antacid medicine to drink. This will prevent acid contents in your stomach from going into your lungs if you vomit during the surgery.  °· You may be given an antibiotic medicine to prevent infection.  °PROCEDURE  °· You may be given medicine to numb the lower half of your body (regional anesthetic). If you were in labor, you may have already had an epidural in place which can be used in both labor and cesarean delivery. You may possibly be given medicine to make you sleep (general anesthetic) though this is not as common.  °· An incision will be made in your abdomen that extends to your uterus. There are 2 basic kinds of incisions:  °· The horizontal (transverse) incision. Horizontal incisions are used for most routine cesarean deliveries.  °· The vertical (up and down) incision. This is less commonly used. This is most often reserved for women who have a  serious complication (extreme prematurity) or under emergency situations.   °· The horizontal and vertical incisions may both be used at the same time. However, this is very uncommon.  °· Your baby will then be delivered.  °AFTER THE PROCEDURE  °· If you were awake during the surgery, you will see your baby right away. If you were asleep, you will see your baby as soon as you are awake.  °· You may breastfeed your baby after surgery.  °· You may be able to get up and walk the same day as the surgery. If you need to stay in bed for a period of time, you will receive help to turn, cough, and take deep breaths after surgery. This helps prevent lung problems such as pneumonia.  °· Do not get out of bed alone the first time after surgery. You will need help getting out of bed until you are able to do this by yourself.  °· You may be able to shower the day after your cesarean delivery.  After the bandage (dressing) is taken off the incision site, a nurse will assist you to shower, if you like.   °· You will have pneumatic compressing hose placed on your feet or lower legs. These hose are used to prevent blood clots. When you are up and walking regularly, they will no longer be necessary.   °· Do not cross your legs when you sit.  °· Save any blood clots that you   pass. If you pass a clot while on the toilet, do not flush it. Call for the nurse. Tell the nurse if you think you are bleeding too much or passing too many clots.  °· Start drinking liquids and eating food as directed by your caregiver. If your stomach is not ready, drinking and eating too soon can cause an increase in bloating and swelling of your intestine and abdomen. This is very uncomfortable.  °· You will be given medicine as needed. Let your caregivers know if you are hurting. They want you to be comfortable. You may also be given an antibiotic to prevent an infection.  °· Your IV will be taken out when you are drinking a reasonable amount of fluids. The  Foley catheter is taken out when you are up and walking.  °· If your blood type is Rh negative and your baby's blood type is Rh positive, you will be given a shot of anti-D immune globulin. This shot prevents you from having Rh problems with a future pregnancy. You should get the shot even if you had your tubes tied (tubal ligation).  °· If you are allowed to take the baby for a walk, place the baby in the bassinet and push it. Do not carry your baby in your arms.  °Document Released: 03/29/2005 Document Revised: 12/09/2010 Document Reviewed: 07/24/2010 °Exit[employer]® Patient Information ©2012 [employer]. °

## 2011-06-08 NOTE — Progress Notes (Signed)
Patient ID: Betty Cain, female   DOB: 07-03-1975, 36 y.o.   MRN: [id number]  POD #6 status post repeat cesarean section HD #2 for endometritis  S. She feels better, she says that her headache is much improved, no more nausea/vomitting, and decreased abdominal tenderness.  O. VSS, AF during entire hospital admission      Heart- rrr     Lungs- CTAB     Abd- appropriately tender fundus, firm at u-2     Incision- c/d/i, no erythema  I had her stand up and she denies that her headache returns.  A/P. Endometritis- symptoms have resolved and she is clinically improved. She states that she feels able to go home today.

## 2011-06-08 NOTE — Discharge Summary (Signed)
Physician Discharge Summary  Patient ID: Betty Cain MRN: [id number] DOB/AGE: 34-May-1977 34 y.o.  Admit date: 06/06/2011 Discharge date: 06/08/2011  Admission Diagnoses: post op endometritis, headache  Discharge Diagnoses: same Active Problems:  * No active hospital problems. *    Discharged Condition: good  Hospital Course: She was admitted with the above diagnoses and treated with IV Zosyn. Her symptoms improved. Her WBC was never elevated and she was afebrile throughout her hospital course.  Consults: None  Significant Diagnostic Studies: labs:   Treatments: antibiotics: Zosyn  Discharge Exam: Blood pressure 133/91, pulse 99, temperature 98.8 F (37.1 C), temperature source Oral, resp. rate 18, height 4\' 10"  (1.473 m), weight 61.236 kg (135 lb), SpO2 99.00%, currently breastfeeding. General appearance: alert and cooperative Heart- rrr Lungs-CTAB Abd- benign Incision- healing well  Disposition: 01-Home or Self Care  Discharge Orders    Future Appointments: Provider: Department: Dept Phone: Center:   07/14/2011 11:00 AM Joseph Pierini. Dorcas Carrow, CNM [facility] [phone] [facility]     Medication List  As of 06/08/2011  7:43 AM   TAKE these medications         butalbital-acetaminophen-caffeine 50-325-40 MG per tablet   Commonly known as: FIORICET, ESGIC   Take 2 tablets by mouth 3 (three) times daily.      clindamycin 300 MG capsule   Commonly known as: CLEOCIN   Take 1 capsule (300 mg total) by mouth 3 (three) times daily.      ibuprofen 600 MG tablet   Commonly known as: ADVIL,MOTRIN   Take 1 tablet (600 mg total) by mouth every 6 (six) hours as needed for pain.      oxyCODONE-acetaminophen 5-325 MG per tablet   Commonly known as: PERCOCET   Take 1 tablet by mouth every 4 (four) hours as needed for pain.      prenatal multivitamin Tabs   Take 1 tablet by mouth daily.           Follow-up Information    Follow up with Tilda Burrow, MD in 1 week.   Contact information:   [facility] [address], [address] [place] [address] [phone]          Signed: Allie Bossier 06/08/2011, 7:43 AM

## 2011-06-08 NOTE — Progress Notes (Signed)
UR Chart review completed.  

## 2011-06-18 ENCOUNTER — Inpatient Hospital Stay (HOSPITAL_COMMUNITY)
Admission: AD | Admit: 2011-06-18 | Discharge: 2011-06-18 | Disposition: A | Discharge status: Home / Self Care | Payer: Medicaid Other | Source: Ambulatory Visit | Attending: Obstetrics & Gynecology | Admitting: Obstetrics & Gynecology

## 2011-06-18 ENCOUNTER — Encounter (HOSPITAL_COMMUNITY): Payer: Self-pay | Admitting: *Deleted

## 2011-06-18 DIAGNOSIS — O8600 Infection of obstetric surgical wound, unspecified: Secondary | ICD-10-CM

## 2011-06-18 DIAGNOSIS — R109 Unspecified abdominal pain: Secondary | ICD-10-CM | POA: Insufficient documentation

## 2011-06-18 DIAGNOSIS — O909 Complication of the puerperium, unspecified: Secondary | ICD-10-CM | POA: Insufficient documentation

## 2011-06-18 LAB — CBC
Hemoglobin: 12.1 g/dL (ref 12.0–15.0)
MCHC: 32.9 g/dL (ref 30.0–36.0)
Platelets: 410 10*3/uL — ABNORMAL HIGH (ref 150–400)
RDW: 13.8 % (ref 11.5–15.5)

## 2011-06-18 MED ORDER — SULFAMETHOXAZOLE-TRIMETHOPRIM 800-160 MG PO TABS: 1.0000 | ORAL_TABLET | Freq: Two times a day (BID) | ORAL | Status: AC

## 2011-06-18 NOTE — ED Provider Notes (Signed)
History     Chief Complaint  Patient presents with  . Abdominal Pain    c/o incisional pain for past 3 days with purulent discharge with incision for past 1 day   HPI Patient is s/p repeat C-section on 06/02/11, who presents to the MAU for 3 day history of incisional pain and 1 day history of purulent drainage from incision. Steri strips are in place. She states she has not had any bleeding from the incision. She has minimal lochia. She is breast and bottle feeding with no difficulty. Patient was readmitted post-partum for spinal headache secondary to epidural. She states this headache has improved. She denies fevers, chest pain, SOB, dysuria. She does have some pain in her legs which is not new. She has not been to clinic for follow-up visit yet. Next appointment is April 4 at [facility].   OB History    Grav Para Term Preterm Abortions TAB SAB Ect Mult Living   4 4 4       3       Past Medical History  Diagnosis Date  . Anemia   . Neonatal death     Vaginal delivery, full term-lived x2 hours.   Marland Kitchen Spinal headache     Past Surgical History  Procedure Date  . Cesarean section   . Adenoidectomy     as a child  . Boil 2003    right elbow  . Cesarean section 06/02/2011    Procedure: CESAREAN SECTION;  Surgeon: Tilda Burrow, MD;  Location: [facility];  Service: Gynecology;  Laterality: N/A;  Primary Cesarean Section Delivery Baby Boy @ 0004, Apgars 9/9    Family History  Problem Relation Age of Onset  . Anesthesia problems Neg Hx   . Diabetes Mother   . Diabetes Father   . Diabetes Sister     History  Substance Use Topics  . Smoking status: Never Smoker   . Smokeless tobacco: Never Used  . Alcohol Use: No    Allergies: No Known Allergies  Prescriptions prior to admission  Medication Sig Dispense Refill  . butalbital-acetaminophen-caffeine (FIORICET, ESGIC) 50-325-40 MG per tablet Take 2 tablets by mouth 3 (three) times daily.  14 tablet  0  . clindamycin (CLEOCIN)  300 MG capsule Take 1 capsule (300 mg total) by mouth 3 (three) times daily.  21 capsule  0  . Prenatal Vit-Fe Fumarate-FA (PRENATAL MULTIVITAMIN) TABS Take 1 tablet by mouth daily.        ROS Negative except as noted in HPI above.   Physical Exam   Blood pressure 122/79, pulse 80, temperature 98.6 F (37 C), resp. rate 18, height 4\' 10"  (1.473 m), weight 61.236 kg (135 lb), currently breastfeeding.  Physical Exam  Constitutional: She appears well-developed and well-nourished. No distress.  HENT:  Head: Normocephalic and atraumatic.  Neck: Normal range of motion.  Cardiovascular: Normal rate, regular rhythm and normal heart sounds.   Respiratory: Effort normal and breath sounds normal.  GI:       Tenderness in lower quadrants/midline. Incision with steristrips in place. Mid to right side with increased drainage and tenderness. No fluctuance on increased erythema. One steristrip has lifted up and there is a 0.5cm superficial ulceration without significant drainage. No inguinal LAD.  Musculoskeletal: Normal range of motion. She exhibits no edema.       Good ROM of lower extremities. No evidence on DVT on exam  Neurological: She is alert.  Skin: Skin is warm and dry.  MAU Course  Procedures Results for orders placed during the hospital encounter of 06/18/11 (from the past 24 hour(s))  CBC     Status: Abnormal   Collection Time   06/18/11 11:24 AM      Component Value Range   WBC 6.7  4.0 - 10.5 (K/uL)   RBC 4.10  3.87 - 5.11 (MIL/uL)   Hemoglobin 12.1  12.0 - 15.0 (g/dL)   HCT 16.1  09.6 - 04.5 (%)   MCV 89.8  78.0 - 100.0 (fL)   MCH 29.5  26.0 - 34.0 (pg)   MCHC 32.9  30.0 - 36.0 (g/dL)   RDW 40.9  81.1 - 91.4 (%)   Platelets 410 (*) 150 - 400 (K/uL)    MDM Discussed with Sid Falcon, CNM who examined patient. CBC ordered to check WBC. Patient afebrile.   Assessment and Plan  36 yo F s/p RLTCS on 06/02/11 presenting to MAU for increased incisional pain and  purulent drainage - CBC within normal limits. WBC 6.7 - Steristrips removed and wound cleaned. Overall, healing very well. No concern for abscess - Given Bactrim DS po BID x 7 days. Will follow up in clinic in one week to have wound rechecked - Plan discussed with Sid Falcon, CNM who agrees with above plan - Patient discharged home in stable medical condition.  Betty Cain 06/18/2011, 12:18 PM

## 2011-06-18 NOTE — ED Provider Notes (Signed)
Attestation of Attending Supervision of Advanced Practitioner: Evaluation and management procedures were performed by the PA/NP/CNM/OB Fellow under my supervision/collaboration. Chart reviewed, and agree with management and plan.  Jerami Tammen, M.D. 06/18/2011 2:41 PM   

## 2011-06-25 ENCOUNTER — Ambulatory Visit: Payer: Self-pay | Admitting: Advanced Practice Midwife

## 2011-06-25 VITALS — BP 115/76 | HR 106 | Temp 97.5°F | Wt 127.0 lb

## 2011-06-25 DIAGNOSIS — Z98891 History of uterine scar from previous surgery: Secondary | ICD-10-CM

## 2011-06-25 NOTE — Progress Notes (Signed)
36 y.o. Betty Cain here for incision check s/p repeat c/s on 2/20. No c/o.   O: BP 115/76  Pulse 106  Temp(Src) 97.5 F (36.4 C) (Oral)  Wt 127 lb (57.607 kg)  Breastfeeding? Yes Abd: soft, nontender, incision healing well, intact, no erythema or exudate  A/P: 3 weeks s/p repeat c/s - incision healing well Routine care F/U for 6 week postpartum

## 2011-07-14 ENCOUNTER — Encounter: Payer: Self-pay | Admitting: Physician Assistant

## 2011-07-14 ENCOUNTER — Ambulatory Visit (INDEPENDENT_AMBULATORY_CARE_PROVIDER_SITE_OTHER): Payer: Medicaid Other | Admitting: Physician Assistant

## 2011-07-14 NOTE — Patient Instructions (Signed)
Contraceptive Implant Information A contraceptive implant is a plastic rod that is inserted under the skin. It is usually inserted under the skin of your upper arm. It continually releases small amounts of progestin (synthetic progesterone) into the bloodstream. This prevents an egg from being released from the ovary. It also thickens the cervical mucus to prevent sperm from entering the cervix, and it thins the uterine lining to prevent a fertilized egg from attaching to the uterus. They can be effective for up to 3 years. Implants do not provide protection against sexually transmitted diseases (STDs).  The procedure to insert an implant usually takes about 10 minutes. There may be minor bruising, swelling, and discomfort at the insertion site for a couple days. The implant begins to work within the first day. Other contraceptive protection should be used for 2 weeks. Follow up with your caregiver to get rechecked as directed. Your caregiver will make sure you are a good candidate for the contraceptive implant. Discuss with your caregiver the possible side effects of the implant ADVANTAGES  It prevents pregnancy for up to 3 years.   It is easily reversible.   It is convenient.   The progestins may protect against uterine and ovarian cancer.   It can be used when breastfeeding.   It can be used by women who cannot take estrogen.  DISADVANTAGES  You may have irregular or unplanned vaginal bleeding.   You may develop side effects, including headache, weight gain, acne, breast tenderness, or mood changes.   You may have tissue or nerve damage after insertion (rare).   It may be difficult and uncomfortable to remove.   Certain medications may interfere with the effectiveness of the implants.  REMOVAL OF IMPLANT The implant should be removed in 3 years or as directed by your caregiver. The implants effect wears off in a few hours after removal. Your ability to get pregnant (fertility) is  restored within a couple of weeks. New implants can be inserted as soon as the old ones are removed if desired. DO NOT GET THE IMPLANT IF:   You are pregnant.   You have a history of breast cancer, osteoporosis, blood clots, heart disease, diabetes, high blood pressure, liver disease, tumors, or stroke.    You have undiagnosed vaginal bleeding.   You have overly sensitive to certain parts of the implant.  Document Released: 03/18/2011 Document Reviewed: 03/16/2011 Exit[employer] Patient Information 2012 [employer]. 

## 2011-07-14 NOTE — Progress Notes (Unsigned)
Pt c/o cough @ night and chest pain

## 2011-07-15 LAB — CBC
Platelets: 398 10*3/uL (ref 150–400)
RBC: 4 MIL/uL (ref 3.87–5.11)
WBC: 7.3 10*3/uL (ref 4.0–10.5)

## 2011-07-21 ENCOUNTER — Encounter: Payer: Self-pay | Admitting: Physician Assistant

## 2011-07-21 ENCOUNTER — Ambulatory Visit (INDEPENDENT_AMBULATORY_CARE_PROVIDER_SITE_OTHER): Payer: Medicaid Other | Admitting: Physician Assistant

## 2011-07-21 VITALS — BP 122/81 | HR 106 | Temp 99.4°F | Ht 60.0 in | Wt 122.5 lb

## 2011-07-21 DIAGNOSIS — Z01812 Encounter for preprocedural laboratory examination: Secondary | ICD-10-CM

## 2011-07-21 DIAGNOSIS — Z30430 Encounter for insertion of intrauterine contraceptive device: Secondary | ICD-10-CM

## 2011-07-21 LAB — POCT PREGNANCY, URINE: Preg Test, Ur: NEGATIVE

## 2011-07-21 MED ORDER — IBUPROFEN 200 MG PO TABS: 600.0000 mg | ORAL_TABLET | Freq: Once | ORAL | Status: DC

## 2011-07-21 NOTE — Progress Notes (Signed)
Patient identified, informed consent performed, signed copy in chart, time out was performed.  Urine pregnancy test negative.  Speculum placed in the vagina.  Cervix visualized.  Cleaned with Betadine x 2.  Grasped posterior with a single tooth tenaculum.  Uterus sounded to 9.5cm  Mirena IUD placed per manufacturer's recommendations.  Strings trimmed to 1.5 cm.   Patient given post procedure instructions and Mirena care card with expiration date.  Patient is asked to check IUD strings periodically and follow up in 4-6 weeks for IUD check.

## 2011-07-21 NOTE — Patient Instructions (Signed)

## 2011-07-21 NOTE — Progress Notes (Unsigned)
  Subjective:     Betty Cain is a 36 y.o. female who presents for a postpartum visit. She is 6 weeks postpartum following a low cervical transverse Cesarean section. I have fully reviewed the prenatal and intrapartum course. The delivery was at [redacted]w[redacted]d gestational weeks. Outcome: repeat cesarean section, low transverse incision. Anesthesia: spinal. Postpartum course has been uncomplicated. Baby's course has been uncomplicated. Baby is feeding by breast. Bleeding small. Bowel function is normal. Bladder function is normal. Patient is not sexually active. Contraception method is abstinence. Postpartum depression screening: negative.  The following portions of the patient's history were reviewed and updated as appropriate: allergies, current medications, past family history, past medical history, past social history, past surgical history and problem list.  Review of Systems A comprehensive review of systems was negative.   Objective:    BP 108/75  Pulse 94  Temp(Src) 98.7 F (37.1 C) (Oral)  Ht 5' (1.524 m)  Wt 123 lb 11.2 oz (56.11 kg)  BMI 24.16 kg/m2  Breastfeeding? Yes  General:  alert, cooperative and no distress   Breasts:  inspection negative, no nipple discharge or bleeding, no masses or nodularity palpable  Abdomen: soft, non-tender; bowel sounds normal; no masses,  no organomegaly and incision well healed        Assessment:    6 weeks postpartum exam. Pap smear not done at today's visit.   Plan:    1. Contraception: desires Mirena IUD 2. Breastfeeding 3. Follow up in: 1-2 weeks for IUD insert

## 2011-08-10 ENCOUNTER — Encounter (HOSPITAL_COMMUNITY): Payer: Self-pay | Admitting: Emergency Medicine

## 2011-08-10 ENCOUNTER — Emergency Department (HOSPITAL_COMMUNITY)
Admission: EM | Admit: 2011-08-10 | Discharge: 2011-08-10 | Disposition: A | Discharge status: Home / Self Care | Payer: Medicaid Other | Attending: Emergency Medicine | Admitting: Emergency Medicine

## 2011-08-10 DIAGNOSIS — J029 Acute pharyngitis, unspecified: Secondary | ICD-10-CM | POA: Insufficient documentation

## 2011-08-10 DIAGNOSIS — D649 Anemia, unspecified: Secondary | ICD-10-CM | POA: Insufficient documentation

## 2011-08-10 MED ORDER — AZITHROMYCIN 250 MG PO TABS: ORAL_TABLET | ORAL | Status: DC

## 2011-08-10 NOTE — ED Notes (Signed)
Onset one day ago cough, fever, body achy, and sore throat.  Pain currently 10/10 sore swallowing. Airway intact bilateral equal chest rise and fall.

## 2011-08-10 NOTE — ED Notes (Addendum)
Pt has limited english.  States that she has had a cough-nonproductive-also reports head pressure, sore throat and ear and eye pain.  No distress noted, no obvious swelling or redness in throat.  Pt reports having a fever, denies checking temperature at home PTA.  Denies CP.

## 2011-08-10 NOTE — ED Provider Notes (Signed)
History     CSN: [id number]  Arrival date & time 08/10/11  1703   First MD Initiated Contact with Patient 08/10/11 1736      Chief Complaint  Patient presents with  . Fever    (Consider location/radiation/quality/duration/timing/severity/associated sxs/prior treatment) Patient is a 36 y.o. female presenting with fever. The history is provided by the patient (the pt complains of cough and sore throat).  Fever Primary symptoms of the febrile illness include fever and cough. Primary symptoms do not include fatigue, headaches, abdominal pain, diarrhea or rash. The current episode started 2 days ago. This is a new problem. The problem has not changed since onset. Associated with: nothing. Risk factors: none.   Past Medical History  Diagnosis Date  . Anemia   . Neonatal death     Vaginal delivery, full term-lived x2 hours.   Marland Kitchen Spinal headache     Past Surgical History  Procedure Date  . Cesarean section   . Adenoidectomy     as a child  . Boil 2003    right elbow  . Cesarean section 06/02/2011    Procedure: CESAREAN SECTION;  Surgeon: Tilda Burrow, MD;  Location: [facility];  Service: Gynecology;  Laterality: N/A;  Primary Cesarean Section Delivery Baby Boy @ 0004, Apgars 9/9    Family History  Problem Relation Age of Onset  . Anesthesia problems Neg Hx   . Diabetes Mother   . Diabetes Father   . Diabetes Sister     History  Substance Use Topics  . Smoking status: Never Smoker   . Smokeless tobacco: Never Used  . Alcohol Use: No    OB History    Grav Para Term Preterm Abortions TAB SAB Ect Mult Living   4 4 4       3       Review of Systems  Constitutional: Positive for fever. Negative for fatigue.  HENT: Positive for trouble swallowing. Negative for congestion, sinus pressure and ear discharge.   Eyes: Negative for discharge.  Respiratory: Positive for cough.   Cardiovascular: Negative for chest pain.  Gastrointestinal: Negative for abdominal pain and  diarrhea.  Genitourinary: Negative for frequency and hematuria.  Musculoskeletal: Negative for back pain.  Skin: Negative for rash.  Neurological: Negative for seizures and headaches.  Hematological: Negative.   Psychiatric/Behavioral: Negative for hallucinations.    Allergies  Review of patient's allergies indicates no known allergies.  Home Medications   Current Outpatient Rx  Name Route Sig Dispense Refill  . ATENOLOL 50 MG PO TABS Oral Take 50 mg by mouth 3 (three) times daily.    Marland Kitchen PRENATAL MULTIVITAMIN CH Oral Take 1 tablet by mouth daily.    . AZITHROMYCIN 250 MG PO TABS  Take first 2 tablets together, then 1 every day until finished. 6 tablet 0    BP 104/68  Pulse 101  Temp(Src) 98.4 F (36.9 C) (Oral)  Resp 16  SpO2 100%  Breastfeeding? Yes  Physical Exam  Constitutional: She is oriented to person, place, and time. She appears well-developed.  HENT:  Head: Normocephalic and atraumatic.       Throat inflamed  Eyes: Conjunctivae and EOM are normal. No scleral icterus.  Neck: Neck supple. No thyromegaly present.  Cardiovascular: Normal rate and regular rhythm.  Exam reveals no gallop and no friction rub.   No murmur heard. Pulmonary/Chest: No stridor. She has no wheezes. She has no rales. She exhibits no tenderness.  Abdominal: She exhibits no distension. There is  no tenderness. There is no rebound.  Musculoskeletal: Normal range of motion. She exhibits no edema.  Lymphadenopathy:    She has no cervical adenopathy.  Neurological: She is oriented to person, place, and time. Coordination normal.  Skin: No rash noted. No erythema.  Psychiatric: She has a normal mood and affect. Her behavior is normal.    ED Course  Procedures (including critical care time)  Labs Reviewed - No data to display No results found.   1. Pharyngitis       MDM          Benny Lennert, MD 08/10/11 [phone]

## 2011-08-10 NOTE — Discharge Instructions (Signed)
Follow up if not improving.  Tylenol or motrin for pain.  Plenty of fluids.

## 2011-08-25 ENCOUNTER — Ambulatory Visit: Payer: Medicaid Other | Admitting: Advanced Practice Midwife

## 2011-09-03 ENCOUNTER — Ambulatory Visit (INDEPENDENT_AMBULATORY_CARE_PROVIDER_SITE_OTHER): Payer: Self-pay | Admitting: Physician Assistant

## 2011-09-03 ENCOUNTER — Encounter: Payer: Self-pay | Admitting: Physician Assistant

## 2011-09-03 VITALS — BP 115/78 | HR 96 | Temp 98.0°F | Ht <= 58 in | Wt 123.2 lb

## 2011-09-03 DIAGNOSIS — Z30431 Encounter for routine checking of intrauterine contraceptive device: Secondary | ICD-10-CM

## 2011-09-03 DIAGNOSIS — Z975 Presence of (intrauterine) contraceptive device: Secondary | ICD-10-CM | POA: Insufficient documentation

## 2011-09-03 NOTE — Progress Notes (Signed)
History   Chief Complaint:  Contraception   Maryann Mccall is  36 y.o. [phone] No LMP recorded. Patient is not currently having periods (Reason: Lactating).Collier Flowers for FU string check after Mirena IUD placement   General ROS:  negative    Physical Exam   Blood pressure 115/78, pulse 96, temperature 98 F (36.7 C), temperature source Oral, height 4\' 9"  (1.448 m), weight 123 lb 3.2 oz (55.883 kg), currently breastfeeding.  Focused Gynecological Exam: VULVA: normal appearing vulva with no masses, tenderness or lesions, VAGINA: normal appearing vagina with normal color and discharge, no lesions, CERVIX: normal appearing cervix without discharge or lesions, IUD strings visible ~ 1-2 cm below ext os, UTERUS: uterus is normal size, shape, consistency and nontender  Assessment: IUD String check   Plan: FU prn  Ayson Cherubini E. 09/03/2011, 11:53 AM

## 2012-10-27 ENCOUNTER — Encounter (HOSPITAL_COMMUNITY): Payer: Self-pay | Admitting: *Deleted

## 2012-10-27 ENCOUNTER — Emergency Department (HOSPITAL_COMMUNITY)
Admission: EM | Admit: 2012-10-27 | Discharge: 2012-10-27 | Disposition: A | Discharge status: Home / Self Care | Payer: Self-pay | Attending: Emergency Medicine | Admitting: Emergency Medicine

## 2012-10-27 DIAGNOSIS — M79609 Pain in unspecified limb: Secondary | ICD-10-CM | POA: Insufficient documentation

## 2012-10-27 DIAGNOSIS — Z862 Personal history of diseases of the blood and blood-forming organs and certain disorders involving the immune mechanism: Secondary | ICD-10-CM | POA: Insufficient documentation

## 2012-10-27 DIAGNOSIS — M79601 Pain in right arm: Secondary | ICD-10-CM

## 2012-10-27 DIAGNOSIS — B079 Viral wart, unspecified: Secondary | ICD-10-CM | POA: Insufficient documentation

## 2012-10-27 DIAGNOSIS — Z79899 Other long term (current) drug therapy: Secondary | ICD-10-CM | POA: Insufficient documentation

## 2012-10-27 DIAGNOSIS — L989 Disorder of the skin and subcutaneous tissue, unspecified: Secondary | ICD-10-CM | POA: Insufficient documentation

## 2012-10-27 MED ORDER — NAPROXEN 500 MG PO TABS: 500.0000 mg | ORAL_TABLET | Freq: Two times a day (BID) | ORAL | Status: DC

## 2012-10-27 NOTE — ED Notes (Signed)
Reports having right shoulder and arm pain x 1 month. Also has what appears to be a wart on right ring finger. No distress noted.

## 2012-10-27 NOTE — ED Provider Notes (Signed)
History    CSN: [id number] Arrival date & time 10/27/12  7829  First MD Initiated Contact with Patient 10/27/12 1040     Chief Complaint  Patient presents with  . Shoulder Pain   (Consider location/radiation/quality/duration/timing/severity/associated sxs/prior Treatment) Patient is a 37 y.o. female presenting with shoulder pain. The history is provided by the patient. No language interpreter was used.  Shoulder Pain This is a new problem. The current episode started 1 to 4 weeks ago. The problem occurs constantly. Pertinent negatives include no chills, fever, neck pain, numbness or weakness. Associated symptoms comments: Right arm and shoulder pain x 1 month without known injury. She developed a lesion on the right 4th finger at the same time as the discomfort started in her arm. No swelling, discoloration, weakness of the extremity or numbness. .   Past Medical History  Diagnosis Date  . Anemia   . Neonatal death     Vaginal delivery, full term-lived x2 hours.   Marland Kitchen Spinal headache   . IUD (intrauterine device) in place 09/03/2011    Due out in 07/2016   Past Surgical History  Procedure Laterality Date  . Cesarean section    . Adenoidectomy      as a child  . Boil  2003    right elbow  . Cesarean section  06/02/2011    Procedure: CESAREAN SECTION;  Surgeon: Tilda Burrow, MD;  Location: [facility];  Service: Gynecology;  Laterality: N/A;  Primary Cesarean Section Delivery Baby Boy @ 0004, Apgars 9/9   Family History  Problem Relation Age of Onset  . Anesthesia problems Neg Hx   . Diabetes Mother   . Diabetes Father   . Diabetes Sister    History  Substance Use Topics  . Smoking status: Never Smoker   . Smokeless tobacco: Never Used  . Alcohol Use: No   OB History   Grav Para Term Preterm Abortions TAB SAB Ect Mult Living   4 4 4       3      Review of Systems  Constitutional: Negative for fever and chills.  HENT: Negative.  Negative for neck pain.    Musculoskeletal:       See HPI.  Skin: Positive for wound.  Neurological: Negative.  Negative for weakness and numbness.    Allergies  Review of patient's allergies indicates no known allergies.  Home Medications   Current Outpatient Rx  Name  Route  Sig  Dispense  Refill  . acetaminophen (TYLENOL) 500 MG tablet   Oral   Take 500 mg by mouth as needed.         Marland Kitchen atenolol (TENORMIN) 50 MG tablet   Oral   Take 50 mg by mouth 3 (three) times daily.         Marland Kitchen Neomycin-Bacitracin-Polymyxin ([facility] ANTIBIOTIC) [phone] OINT   Apply externally   Apply 1 application topically daily as needed (cut on finger).          BP 108/74  Pulse 84  Temp(Src) 99.4 F (37.4 C) (Oral)  Resp 18  SpO2 96% Physical Exam  Constitutional: She is oriented to person, place, and time. She appears well-developed and well-nourished.  Neck: Normal range of motion.  Cardiovascular: Intact distal pulses.   Pulmonary/Chest: Effort normal.  Musculoskeletal: She exhibits no edema.  FROM of upper right extremity, full strength. She has discomfort to entire arm with active and passive range of motion. No swelling, redness.   Neurological: She is alert  and oriented to person, place, and time. No cranial nerve deficit.  Skin: Skin is warm and dry.  Large, thickened, normopigmented circular lesion to distal 4th finger at DIP joint, c/w wart.    ED Course  Procedures (including critical care time) Labs Reviewed - No data to display No results found. No diagnosis found. 1. Arm pain 2. Wart, finger  MDM  Suspect muscular upper extremity muscular soreness without neurologic deficit, or neck pain. Coincidental wart growth to right finger.   Arnoldo Hooker, PA-C 10/27/12 1132

## 2012-10-28 NOTE — ED Provider Notes (Signed)
Medical screening examination/treatment/procedure(s) were performed by non-physician practitioner and as supervising physician I was immediately available for consultation/collaboration.   Carleene Cooper III, MD 10/28/12 1024

## 2012-11-24 ENCOUNTER — Ambulatory Visit (INDEPENDENT_AMBULATORY_CARE_PROVIDER_SITE_OTHER): Payer: Self-pay | Admitting: Nurse Practitioner

## 2012-11-24 ENCOUNTER — Encounter: Payer: Self-pay | Admitting: Nurse Practitioner

## 2012-11-24 VITALS — BP 115/78 | HR 94 | Temp 99.2°F | Ht <= 58 in | Wt 136.3 lb

## 2012-11-24 DIAGNOSIS — Z30432 Encounter for removal of intrauterine contraceptive device: Secondary | ICD-10-CM

## 2012-11-24 NOTE — Progress Notes (Signed)
S: Pt is in office today to have her IUD removed. Multiple complaints concerning IUD. When asked if she wanted to become pregnant she said " Yes"  O: Genitalia: Externally negative, Vag neg, cervix negative. IUD pulled without complications. Biman Negative  A: Contraception  P: IUD removed per Pt request
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
## 2012-11-24 NOTE — Progress Notes (Signed)
States is here to get her iud removed- causing backpain, headache, makes her nervous , leg pain. States IUD was inserted 4/14/12014 here in clinic. States feels like going to have period, but it doesn't come on.

## 2013-03-12 ENCOUNTER — Encounter: Payer: Self-pay | Admitting: *Deleted

## 2013-05-13 ENCOUNTER — Encounter (HOSPITAL_COMMUNITY): Payer: Self-pay | Admitting: Emergency Medicine

## 2013-05-13 ENCOUNTER — Emergency Department (HOSPITAL_COMMUNITY)
Admission: EM | Admit: 2013-05-13 | Discharge: 2013-05-13 | Disposition: A | Discharge status: Home / Self Care | Payer: Medicaid Other | Attending: Emergency Medicine | Admitting: Emergency Medicine

## 2013-05-13 DIAGNOSIS — IMO0001 Reserved for inherently not codable concepts without codable children: Secondary | ICD-10-CM | POA: Insufficient documentation

## 2013-05-13 DIAGNOSIS — Z3490 Encounter for supervision of normal pregnancy, unspecified, unspecified trimester: Secondary | ICD-10-CM

## 2013-05-13 DIAGNOSIS — M259 Joint disorder, unspecified: Secondary | ICD-10-CM | POA: Insufficient documentation

## 2013-05-13 DIAGNOSIS — Z862 Personal history of diseases of the blood and blood-forming organs and certain disorders involving the immune mechanism: Secondary | ICD-10-CM | POA: Insufficient documentation

## 2013-05-13 DIAGNOSIS — H9209 Otalgia, unspecified ear: Secondary | ICD-10-CM | POA: Insufficient documentation

## 2013-05-13 DIAGNOSIS — R6889 Other general symptoms and signs: Secondary | ICD-10-CM

## 2013-05-13 DIAGNOSIS — R0789 Other chest pain: Secondary | ICD-10-CM | POA: Insufficient documentation

## 2013-05-13 DIAGNOSIS — M549 Dorsalgia, unspecified: Secondary | ICD-10-CM | POA: Insufficient documentation

## 2013-05-13 DIAGNOSIS — J111 Influenza due to unidentified influenza virus with other respiratory manifestations: Secondary | ICD-10-CM | POA: Insufficient documentation

## 2013-05-13 DIAGNOSIS — R1084 Generalized abdominal pain: Secondary | ICD-10-CM | POA: Insufficient documentation

## 2013-05-13 DIAGNOSIS — M899 Disorder of bone, unspecified: Secondary | ICD-10-CM | POA: Insufficient documentation

## 2013-05-13 DIAGNOSIS — R000 Tachycardia, unspecified: Secondary | ICD-10-CM | POA: Insufficient documentation

## 2013-05-13 DIAGNOSIS — O9989 Other specified diseases and conditions complicating pregnancy, childbirth and the puerperium: Secondary | ICD-10-CM | POA: Insufficient documentation

## 2013-05-13 DIAGNOSIS — R599 Enlarged lymph nodes, unspecified: Secondary | ICD-10-CM | POA: Insufficient documentation

## 2013-05-13 MED ORDER — OSELTAMIVIR PHOSPHATE 75 MG PO CAPS: 75.0000 mg | ORAL_CAPSULE | Freq: Two times a day (BID) | ORAL | Status: DC

## 2013-05-13 MED ORDER — ONDANSETRON HCL 4 MG/2ML IJ SOLN
4.0000 mg | Freq: Once | INTRAMUSCULAR | Status: AC
  Administered 2013-05-13: 4 mg via INTRAVENOUS
  Filled 2013-05-13: qty 2

## 2013-05-13 MED ORDER — OSELTAMIVIR PHOSPHATE 75 MG PO CAPS
75.0000 mg | ORAL_CAPSULE | Freq: Once | ORAL | Status: AC
  Administered 2013-05-13: 75 mg via ORAL
  Filled 2013-05-13: qty 1

## 2013-05-13 MED ORDER — PRENATAL VITAMINS 0.8 MG PO TABS: 1.0000 | ORAL_TABLET | Freq: Every day | ORAL | Status: DC

## 2013-05-13 MED ORDER — BENZONATATE 100 MG PO CAPS: 100.0000 mg | ORAL_CAPSULE | Freq: Three times a day (TID) | ORAL | Status: DC

## 2013-05-13 MED ORDER — BENZONATATE 100 MG PO CAPS
100.0000 mg | ORAL_CAPSULE | Freq: Once | ORAL | Status: AC
  Administered 2013-05-13: 100 mg via ORAL
  Filled 2013-05-13: qty 1

## 2013-05-13 MED ORDER — SODIUM CHLORIDE 0.9 % IV BOLUS (SEPSIS)
1000.0000 mL | Freq: Once | INTRAVENOUS | Status: AC
  Administered 2013-05-13: 1000 mL via INTRAVENOUS

## 2013-05-13 NOTE — ED Provider Notes (Signed)
Medical screening examination/treatment/procedure(s) were performed by non-physician practitioner and as supervising physician I was immediately available for consultation/collaboration.  EKG Interpretation   None         Charles B. Sheldon, MD 05/13/13 2322 

## 2013-05-13 NOTE — ED Notes (Signed)
shes had a cough, fatigue, fevers for past few days . She is worried she may have the flu.Pt is [redacted] weeks pregnant.

## 2013-05-13 NOTE — ED Notes (Signed)
Resting; coughing less.  Infusion continues. Husband at bedside.

## 2013-05-13 NOTE — ED Notes (Addendum)
Cough x 10 days, constant for past two days. Is [redacted] weeks pregnant.  Has been unable to eat for past couple days.

## 2013-05-13 NOTE — ED Provider Notes (Addendum)
CSN: [id number]     Arrival date & time 05/13/13  1850 History  This chart was scribed for Domenic Moras, PA with No att. providers found by Mercy Moore, ED Scribe. This patient was seen in room TR06C/TR06C and the patient's care was started at 7:20 PM.    Chief Complaint  Patient presents with  . Cough    The history is provided by the patient. No language interpreter was used.   HPI Comments: Betty Cain is a 38 y.o. female who presents to the Emergency Department complaining of a severe cough that has persisted for 10 days. Patient reports associated subjective fever and fatigue and suspects that she may have the flu. Patient experiences posttussive emesis and vomiting after attempts at eating. Patient reports sneezing, ear pain, and sore throat. Patient denies rhinorrhea. Patient reports chest pain, back pain and general myalgia due to her violent coughing. Patient also reports shortness of breath when lying down and is awakened during the night by her coughing.  Patient denies diarrhea, but does report constipation; however, her last bowel movement was earlier today. Patient also reports palpitations and light headedness upon standing. No treatment has been attempted at home.  Patient is [redacted] weeks pregnant. [place]  Past Medical History  Diagnosis Date  . Anemia   . Neonatal death     Vaginal delivery, full term-lived x2 hours.   Marland Kitchen Spinal headache   . IUD (intrauterine device) in place 09/03/2011    Due out in 07/2016   Past Surgical History  Procedure Laterality Date  . Cesarean section    . Adenoidectomy      as a child  . Boil  2003    right elbow  . Cesarean section  06/02/2011    Procedure: CESAREAN SECTION;  Surgeon: [name], MD;  Location: [facility];  Service: Gynecology;  Laterality: N/A;  Primary Cesarean Section Delivery Baby Boy @ 0004, Apgars 9/9   Family History  Problem Relation Age of Onset  . Anesthesia problems Neg Hx   . Diabetes Mother   . Diabetes Father    . Diabetes Sister    History  Substance Use Topics  . Smoking status: Never Smoker   . Smokeless tobacco: Never Used  . Alcohol Use: No   OB History   Grav Para Term Preterm Abortions TAB SAB Ect Mult Living   4 4 4       3      Review of Systems  Constitutional: Positive for fever and fatigue.  HENT: Positive for ear pain, sneezing and sore throat. Negative for rhinorrhea.   Respiratory: Positive for cough and chest tightness.   Musculoskeletal: Positive for back pain and myalgias.    Allergies  Review of patient's allergies indicates no known allergies.  Home Medications   No current outpatient prescriptions on file. BP 121/68  Pulse 102  Temp(Src) 98.2 F (36.8 C) (Oral)  Resp 16  Wt 137 lb 9.6 oz (62.415 kg)  SpO2 98% Physical Exam  Nursing note and vitals reviewed. Constitutional: She is oriented to person, place, and time. She appears well-developed and well-nourished. No distress.  HENT:  Head: Normocephalic and atraumatic.  Right Ear: External ear normal.  Left Ear: External ear normal.  Mouth/Throat: No uvula swelling. Posterior oropharyngeal erythema present. No oropharyngeal exudate.  Eyes: EOM are normal.  Neck: Neck supple. No tracheal deviation present.  Cardiovascular: Tachycardia present.   Pulmonary/Chest: Effort normal. No respiratory distress.  Abdominal: There is tenderness.  Gravid  abdomen. Mild generalized abdominal tenderness.  Musculoskeletal: Normal range of motion.  Lymphadenopathy:    She has cervical adenopathy.  Neurological: She is alert and oriented to person, place, and time.  Skin: Skin is warm and dry.  Psychiatric: She has a normal mood and affect. Her behavior is normal.    ED Course  Procedures (including critical care time) DIAGNOSTIC STUDIES: Oxygen Saturation is 98% on room air, normal by my interpretation.    COORDINATION OF CARE: 7:27 PM- Patient suspected to have viral infection, possibly influenza. Will order  Tamiflu and cough medication. Since she is mildly tachycardic and report having n/v will provide IVF and monitor.  Lungs CTAB, doubt pna at this time.  Low suspicion for biliary disease in light of the prevalent flu-like presenting sxs.  Care discussed with Dr. Karle Starch.  Pt advised of plan for treatment and pt agrees.  2:11 PM Influenza panel negative for influenza.    Labs Review Labs Reviewed - No data to display Imaging Review No results found.  EKG Interpretation   None       MDM   1. Flu-like symptoms   2. Pregnancy    BP 108/70  Pulse 93  Temp(Src) 99.2 F (37.3 C) (Oral)  Resp 19  Wt 137 lb 9.6 oz (62.415 kg)  SpO2 100%   I personally performed the services described in this documentation, which was scribed in my presence. The recorded information has been reviewed and is accurate.     Domenic Moras, PA-C 05/13/13 2124  Domenic Moras, PA-C 05/14/13 [phone]

## 2013-05-13 NOTE — Discharge Instructions (Signed)
Influenza, Adult Influenza ("the flu") is a viral infection of the respiratory tract. It occurs more often in winter months because people spend more time in close contact with one another. Influenza can make you feel very sick. Influenza easily spreads from person to person (contagious). CAUSES  Influenza is caused by a virus that infects the respiratory tract. You can catch the virus by breathing in droplets from an infected person's cough or sneeze. You can also catch the virus by touching something that was recently contaminated with the virus and then touching your mouth, nose, or eyes. SYMPTOMS  Symptoms typically last 4 to 10 days and may include:  Fever.  Chills.  Headache, body aches, and muscle aches.  Sore throat.  Chest discomfort and cough.  Poor appetite.  Weakness or feeling tired.  Dizziness.  Nausea or vomiting. DIAGNOSIS  Diagnosis of influenza is often made based on your history and a physical exam. A nose or throat swab test can be done to confirm the diagnosis. RISKS AND COMPLICATIONS You may be at risk for a more severe case of influenza if you smoke cigarettes, have diabetes, have chronic heart disease (such as heart failure) or lung disease (such as asthma), or if you have a weakened immune system. Elderly people and pregnant women are also at risk for more serious infections. The most common complication of influenza is a lung infection (pneumonia). Sometimes, this complication can require emergency medical care and may be life-threatening. PREVENTION  An annual influenza vaccination (flu shot) is the best way to avoid getting influenza. An annual flu shot is now routinely recommended for all adults in the [place] TREATMENT  In mild cases, influenza goes away on its own. Treatment is directed at relieving symptoms. For more severe cases, your caregiver may prescribe antiviral medicines to shorten the sickness. Antibiotic medicines are not effective, because the  infection is caused by a virus, not by bacteria. HOME CARE INSTRUCTIONS  Only take over-the-counter or prescription medicines for pain, discomfort, or fever as directed by your caregiver.  Use a cool mist humidifier to make breathing easier.  Get plenty of rest until your temperature returns to normal. This usually takes 3 to 4 days.  Drink enough fluids to keep your urine clear or pale yellow.  Cover your mouth and nose when coughing or sneezing, and wash your hands well to avoid spreading the virus.  Stay home from work or school until your fever has been gone for at least 1 full day. SEEK MEDICAL CARE IF:   You have chest pain or a deep cough that worsens or produces more mucus.  You have nausea, vomiting, or diarrhea. SEEK IMMEDIATE MEDICAL CARE IF:   You have difficulty breathing, shortness of breath, or your skin or nails turn bluish.  You have severe neck pain or stiffness.  You have a severe headache, facial pain, or earache.  You have a worsening or recurring fever.  You have nausea or vomiting that cannot be controlled. MAKE SURE YOU:  Understand these instructions.  Will watch your condition.  Will get help right away if you are not doing well or get worse. Document Released: 03/26/2000 Document Revised: 09/28/2011 Document Reviewed: 06/28/2011 [facility] Patient Information 2014 [place], [place].   Emergency Department Resource Guide 1) Find a Doctor and Pay Out of Pocket Although you won't have to find out who is covered by your insurance plan, it is a good idea to ask around and get recommendations. You will then need to  call the office and see if the doctor you have chosen will accept you as a new patient and what types of options they offer for patients who are self-pay. Some doctors offer discounts or will set up payment plans for their patients who do not have insurance, but you will need to ask so you aren't surprised when you get to your appointment.  2)  Contact Your Local Health Department Not all health departments have doctors that can see patients for sick visits, but many do, so it is worth a call to see if yours does. If you don't know where your local health department is, you can check in your phone book. The CDC also has a tool to help you locate your state's health department, and many state websites also have listings of all of their local health departments.  3) Find a [facility] If your illness is not likely to be very severe or complicated, you may want to try a walk in clinic. These are popping up all over the country in pharmacies, drugstores, and shopping centers. They're usually staffed by nurse practitioners or physician assistants that have been trained to treat common illnesses and complaints. They're usually fairly quick and inexpensive. However, if you have serious medical issues or chronic medical problems, these are probably not your best option.  No Primary Care Doctor: - Call Health Connect at  [phone] - they can help you locate a primary care doctor that  accepts your insurance, provides certain services, etc. - Physician Referral Service- [phone]  Chronic Pain Problems: Organization         Address  Phone   Notes  [facility]  [phone] Patients need to be referred by their primary care doctor.   Medication Assistance: Organization         Address  Phone   Notes  [facility] Medication [facility] [address] [place], [place] [address] [phone] --Must be a resident of [facility] -- Must have NO insurance coverage whatsoever (no Medicaid/ Medicare, etc.) -- The pt. MUST have a primary care doctor that directs their care regularly and follows them in the community   MedAssist  [phone]   [employer]  [phone]    Agencies that provide inexpensive medical care: Organization         Address  Phone   Notes  [place]   [phone]   [facility]    [phone]   [facility] [place],  [address] [phone]   [place] [address] [place] [phone]   Planned Parenthood    [phone]   [facility]    [phone]   [place] and [address], [place] Phone:  [phone], Fax:  [phone] Hours of Operation:  9 am - 6 pm, M-F.  Also accepts Medicaid/Medicare and self-pay.  [facility] for [place], [address], [place] Phone: [phone], Fax: [phone]. Hours of Operation:  8:30 am - 5:30 pm, M-F.  Also accepts Medicaid and self-pay.  [facility] [place] [address], [place] Phone: [phone]   [place], [place], [place] [phone], Ext. [phone] Mondays & Thursdays: 7-9 AM.  First 15 patients are seen on a first come, first serve basis.  [place] Providers:  Organization         Address  Phone   Notes  [facility] [address], [place] [phone] Also accepts self-pay patients.  [facility] [id number] [place], [place]  [phone]   [place], [address], [place] [phone]   [facility] [address], [place] [phone]   Lucianne Lei [address], [place]   [phone] Only accepts [place] Access [place] patients after they have their name applied to their card.   Self-Pay (no insurance) in [facility]:  Organization         Address  Phone   Notes  Sickle Cell Patients, [facility] [place] [phone]   [facility] [place] [phone]   [facility] [place]  [place], [place], [place] [phone]   [facility]/Dr. Osei-Bonsu  [address] [place] or [address], [place] [phone] Phone number for both [place] and [employer] locations is the same.  Urgent Medical and [facility] [address], [place] [phone]   [facility] [address], [place] or [address] [phone]   [facility] [address], [place] [phone], phone; [phone], fax Sees patients 1st and 3rd Saturday of every month.  Must not qualify for public or private insurance (i.e. Medicaid, Medicare, [place] Health Choice, Veterans' Benefits)  Household income should be no more than 200% of the poverty level The clinic cannot treat you if you are pregnant or think you are pregnant  Sexually transmitted diseases are not treated at the clinic.    Dental Care: Organization         Address  Phone  Notes  [facility] Department of [facility] [place] [phone] Accepts children up to age 52 who are enrolled in [place] or [place]; pregnant women with a Medicaid card; and children who have applied for Medicaid or [place] Health Choice, but were declined, whose parents can pay a reduced fee at time of service.  [facility] Department of [facility]  [address], [place] [phone] Accepts children up to age 18 who are enrolled in [place] or [place]; pregnant women with a Medicaid card; and children who have applied for Medicaid or [place] Health Choice, but were declined, whose parents can pay a reduced fee at time of service.  [place] Adult Dental Access PROGRAM  [place] [phone] Patients are seen by appointment only. Walk-ins are not accepted. [place] will see patients 61 years of age and older. Monday - Tuesday  (8am-5pm) Most Wednesdays (8:30-5pm) $30 per visit, cash only  [facility] Adult Dental Access PROGRAM  [address], [facility] [phone] Patients are seen by appointment only. Walk-ins are not accepted. [place] will see patients 75 years of age and older. One Wednesday Evening (Monthly: Volunteer Based).  $30 per visit, cash only  [place]  [phone] for adults; Children under age 24, call Graduate Pediatric Dentistry at [phone]. Children aged 26-14, please call [phone] to request a  pediatric application.  Dental services are provided in all areas of dental care including fillings, crowns and bridges, complete and partial dentures, implants, gum treatment, root canals, and extractions. Preventive care is also provided. Treatment is provided to both adults and children. Patients are selected via a lottery and there is often a waiting list.   [facility] [address], [place]  [phone] [email or web address]   [facility] [address] [place], [place] [phone], Ext. [phone] Second and Fourth Thursday of each month, opens at 6:30 AM; Clinic ends at 9 AM.  Patients are seen on a first-come first-served basis, and a limited number are seen during each clinic.   [facility]  [address] Hillard Danker [place], [place] [phone]   Eligibility Requirements You must have lived in [place], [place], or [place] counties for at least the last three months.   You cannot be eligible for state or federal sponsored [employer], including [employer], [place], or [employer].   You generally cannot be eligible for healthcare insurance through your employer.    How to apply: Eligibility screenings are held every Tuesday and Wednesday afternoon from 1:00 pm until 4:00 pm. You do not need an appointment for the interview!  [facility] [address], [place], [place]   [place]  [place] Department  [place]  [phone]    Behavioral Health Resources in the Community: Intensive Outpatient Programs Organization         Address  Phone  Notes  [place]. [address], [place], [place] [phone]   [facility] [address], [place], [place]   ADS: Alcohol & Drug Svcs [address], [place], [place]   [place] [address]  [place], [place] or [phone]   Substance Abuse Resources Organization         Address  Phone  Notes  Alcohol and Drug Services  [phone]   [place]  [phone]   [facility] [place]   Chinita Pester  [phone]   Residential & Outpatient Substance Abuse Program  [phone]   Psychological Services Organization         Address  Phone  Notes  [facility] [place]  [place]  [phone]   [place] [address] [place] or [phone]    Mobile Crisis Teams Organization         Address  Phone  Notes  [facility], Mobile Crisis Care Unit  [phone]   [facility]  [address] [place], [place]   [place] Levels [address], [place] [phone]    Self-Help/Support Groups Organization         Address  Phone             Notes  [place]. of [place] - variety of support groups  [place] Call for more information  Narcotics Anonymous (NA), [facility] [address], [employer] [place]  2 meetings at this location   Brewing technologist  Notes  [facility]AP Residential Treatment [place],    [place]  [phone]   [place]  [place], [place]  [phone], [place], [place]   [place], [place] [phone] Admissions: 8am-3pm M-F  Incentives Substance [place] [address]    [place], [place] [phone]   [facility] [address] [place], [place], [place]   [facility] [address]  [place], [place]   [facility]ight Programs - Intensive Outpatient [address] Kristeen [address], [place], [place]   [facility] ([place].,  [place], [place] [phone] or [phone]   [facility]idential Treatment Services (RTS) [address] [place], [place] Accepts Medicaid  [facility] [place] [address],  [place] 1-386-873-5734 Substance Abuse/Addiction Treatment   [facility] Organization         Address  Phone  Notes  [facility]  [phone]   Domenic Schwab, PhD [address] Arlis Porta [place], [place]   [phone] or [phone]   Euharlee Matthews [place], [place] [phone]   [facility] [address], [place], [place] [phone] Insurance/Medicaid/sponsorship through [facility] and Families [address] [place]                                    [place], [place] [phone] [place] [address] [place], [place] [phone]    Dr. Adele Schilder  [phone]   [facility] Dept. 1) [address] [place] 2) [place] 3)  [place] [address], [place] [phone]  [phone]   [place] [phone] or [phone] (After Hours)

## 2013-05-14 LAB — INFLUENZA PANEL BY PCR (TYPE A & B)
H1N1 flu by pcr: NOT DETECTED
Influenza A By PCR: NEGATIVE
Influenza B By PCR: NEGATIVE

## 2013-05-14 NOTE — ED Provider Notes (Signed)
Medical screening examination/treatment/procedure(s) were performed by non-physician practitioner and as supervising physician I was immediately available for consultation/collaboration.  EKG Interpretation   None         Charles B. Karle Starch, MD 05/14/13 2208

## 2013-06-04 ENCOUNTER — Encounter: Payer: Self-pay | Admitting: *Deleted

## 2013-06-19 ENCOUNTER — Ambulatory Visit (INDEPENDENT_AMBULATORY_CARE_PROVIDER_SITE_OTHER): Payer: Medicaid Other | Admitting: Obstetrics & Gynecology

## 2013-06-19 ENCOUNTER — Encounter: Payer: Self-pay | Admitting: Obstetrics & Gynecology

## 2013-06-19 VITALS — BP 110/72 | Temp 97.9°F | Wt 131.9 lb

## 2013-06-19 DIAGNOSIS — Z3480 Encounter for supervision of other normal pregnancy, unspecified trimester: Secondary | ICD-10-CM

## 2013-06-19 DIAGNOSIS — IMO0002 Reserved for concepts with insufficient information to code with codable children: Secondary | ICD-10-CM

## 2013-06-19 DIAGNOSIS — O34219 Maternal care for unspecified type scar from previous cesarean delivery: Secondary | ICD-10-CM

## 2013-06-19 DIAGNOSIS — O09529 Supervision of elderly multigravida, unspecified trimester: Secondary | ICD-10-CM

## 2013-06-19 DIAGNOSIS — Z3490 Encounter for supervision of normal pregnancy, unspecified, unspecified trimester: Secondary | ICD-10-CM

## 2013-06-19 LAB — POCT URINALYSIS DIP (DEVICE)
BILIRUBIN URINE: NEGATIVE
GLUCOSE, UA: NEGATIVE mg/dL
KETONES UR: NEGATIVE mg/dL
Leukocytes, UA: NEGATIVE
NITRITE: NEGATIVE
Protein, ur: 30 mg/dL — AB
SPECIFIC GRAVITY, URINE: 1.02 (ref 1.005–1.030)
Urobilinogen, UA: 0.2 mg/dL (ref 0.0–1.0)
pH: 7 (ref 5.0–8.0)

## 2013-06-19 LAB — GLUCOSE TOLERANCE, 1 HOUR (50G) W/O FASTING: Glucose, 1 Hour GTT: 129 mg/dL (ref 70–140)

## 2013-06-19 LAB — HIV ANTIBODY (ROUTINE TESTING W REFLEX): HIV: NONREACTIVE

## 2013-06-19 MED ORDER — PRENATAL VITAMINS 0.8 MG PO TABS: 1.0000 | ORAL_TABLET | Freq: Every day | ORAL | Status: DC

## 2013-06-19 NOTE — Progress Notes (Signed)
Subjective:    Betty Cain is being seen today for her first obstetrical visit.  This is not a planned pregnancy. She is at [redacted]w[redacted]d gestation. Her obstetrical history is significant for advanced maternal age. Relationship with FOB: spouse, living together. Patient does intend to breast feed. Pregnancy history fully reviewed.  Menstrual History: OB History   Grav Para Term Preterm Abortions TAB SAB Ect Mult Living   5 4 4       3        Patient's last menstrual period was 03/12/2013.    The following portions of the patient's history were reviewed and updated as appropriate: allergies, current medications, past family history, past medical history, past social history, past surgical history and problem list.  Review of Systems A comprehensive review of systems was negative.    Objective:    BP 110/72  Temp(Src) 97.9 F (36.6 C)  Wt 131 lb 14.4 oz (59.829 kg)  LMP 03/12/2013  General Appearance:    Alert, cooperative, no distress, appears stated age                 Neck:   Supple, symmetrical, trachea midline, no adenopathy;    thyroid:  no enlargement/tenderness/nodules; no carotid   bruit or JVD  Back:     Symmetric, no curvature, ROM normal, no CVA tenderness  Lungs:     Clear to auscultation bilaterally, respirations unlabored  Chest Wall:    No tenderness or deformity   Heart:    Regular rate and rhythm, S1 and S2 normal, no murmur, rub   or gallop  Breast Exam:    No tenderness, masses, or nipple abnormality  Abdomen:     Soft, non-tender, bowel sounds active all four quadrants,    no masses, no organomegaly  Genitalia:    Normal female without lesion, discharge or tenderness     Extremities:   Extremities normal, atraumatic, no cyanosis or edema  Pulses:   2+ and symmetric all extremities               Assessment:    Pregnancy at 14 and 1/7 weeks  AMA    Plan:    Initial labs drawn. Prenatal vitamins. Problem list reviewed and updated. AFP3 discussed:  requested. Role of ultrasound in pregnancy discussed; fetal survey: requested. Amniocentesis discussed: need to review next visit. Follow up in 4 weeks. 60% of 30 min visit spent on counseling and coordination of care.  Arabic interpreter used for entire history and physical

## 2013-06-19 NOTE — Patient Instructions (Signed)
Second Trimester of Pregnancy The second trimester is from week 13 through week 28, months 4 through 6. The second trimester is often a time when you feel your best. Your body has also adjusted to being pregnant, and you begin to feel better physically. Usually, morning sickness has lessened or quit completely, you may have more energy, and you may have an increase in appetite. The second trimester is also a time when the fetus is growing rapidly. At the end of the sixth month, the fetus is about 9 inches long and weighs about 1 pounds. You will likely begin to feel the baby move (quickening) between 18 and 20 weeks of the pregnancy. BODY CHANGES Your body goes through many changes during pregnancy. The changes vary from woman to woman.   Your weight will continue to increase. You will notice your lower abdomen bulging out.  You may begin to get stretch marks on your hips, abdomen, and breasts.  You may develop headaches that can be relieved by medicines approved by your caregiver.  You may urinate more often because the fetus is pressing on your bladder.  You may develop or continue to have heartburn as a result of your pregnancy.  You may develop constipation because certain hormones are causing the muscles that push waste through your intestines to slow down.  You may develop hemorrhoids or swollen, bulging veins (varicose veins).  You may have back pain because of the weight gain and pregnancy hormones relaxing your joints between the bones in your pelvis and as a result of a shift in weight and the muscles that support your balance.  Your breasts will continue to grow and be tender.  Your gums may bleed and may be sensitive to brushing and flossing.  Dark spots or blotches (chloasma, mask of pregnancy) may develop on your face. This will likely fade after the baby is born.  A dark line from your belly button to the pubic area (linea nigra) may appear. This will likely fade after the  baby is born. WHAT TO EXPECT AT YOUR PRENATAL VISITS During a routine prenatal visit:  You will be weighed to make sure you and the fetus are growing normally.  Your blood pressure will be taken.  Your abdomen will be measured to track your baby's growth.  The fetal heartbeat will be listened to.  Any test results from the previous visit will be discussed. Your caregiver may ask you:  How you are feeling.  If you are feeling the baby move.  If you have had any abnormal symptoms, such as leaking fluid, bleeding, severe headaches, or abdominal cramping.  If you have any questions. Other tests that may be performed during your second trimester include:  Blood tests that check for:  Low iron levels (anemia).  Gestational diabetes (between 24 and 28 weeks).  Rh antibodies.  Urine tests to check for infections, diabetes, or protein in the urine.  An ultrasound to confirm the proper growth and development of the baby.  An amniocentesis to check for possible genetic problems.  Fetal screens for spina bifida and Down syndrome. HOME CARE INSTRUCTIONS   Avoid all smoking, herbs, alcohol, and unprescribed drugs. These chemicals affect the formation and growth of the baby.  Follow your caregiver's instructions regarding medicine use. There are medicines that are either safe or unsafe to take during pregnancy.  Exercise only as directed by your caregiver. Experiencing uterine cramps is a good sign to stop exercising.  Continue to eat regular,   healthy meals.  Wear a good support bra for breast tenderness.  Do not use hot tubs, steam rooms, or saunas.  Wear your seat belt at all times when driving.  Avoid raw meat, uncooked cheese, cat litter boxes, and soil used by cats. These carry germs that can cause birth defects in the baby.  Take your prenatal vitamins.  Try taking a stool softener (if your caregiver approves) if you develop constipation. Eat more high-fiber foods,  such as fresh vegetables or fruit and whole grains. Drink plenty of fluids to keep your urine clear or pale yellow.  Take warm sitz baths to soothe any pain or discomfort caused by hemorrhoids. Use hemorrhoid cream if your caregiver approves.  If you develop varicose veins, wear support hose. Elevate your feet for 15 minutes, 3 4 times a day. Limit salt in your diet.  Avoid heavy lifting, wear low heel shoes, and practice good posture.  Rest with your legs elevated if you have leg cramps or low back pain.  Visit your dentist if you have not gone yet during your pregnancy. Use a soft toothbrush to brush your teeth and be gentle when you floss.  A sexual relationship may be continued unless your caregiver directs you otherwise.  Continue to go to all your prenatal visits as directed by your caregiver. SEEK MEDICAL CARE IF:   You have dizziness.  You have mild pelvic cramps, pelvic pressure, or nagging pain in the abdominal area.  You have persistent nausea, vomiting, or diarrhea.  You have a bad smelling vaginal discharge.  You have pain with urination. SEEK IMMEDIATE MEDICAL CARE IF:   You have a fever.  You are leaking fluid from your vagina.  You have spotting or bleeding from your vagina.  You have severe abdominal cramping or pain.  You have rapid weight gain or loss.  You have shortness of breath with chest pain.  You notice sudden or extreme swelling of your face, hands, ankles, feet, or legs.  You have not felt your baby move in over an hour.  You have severe headaches that do not go away with medicine.  You have vision changes. Document Released: 03/23/2001 Document Revised: 11/29/2012 Document Reviewed: 05/30/2012 ExitCare Patient Information 2014 [employer].  

## 2013-06-19 NOTE — Progress Notes (Signed)
P=101  Initial prenatal visit

## 2013-06-20 LAB — OBSTETRIC PANEL
Antibody Screen: NEGATIVE
Basophils Absolute: 0.1 10*3/uL (ref 0.0–0.1)
Basophils Relative: 1 % (ref 0–1)
EOS ABS: 0.1 10*3/uL (ref 0.0–0.7)
Eosinophils Relative: 2 % (ref 0–5)
HEMATOCRIT: 33 % — AB (ref 36.0–46.0)
HEMOGLOBIN: 11.4 g/dL — AB (ref 12.0–15.0)
HEP B S AG: NEGATIVE
LYMPHS ABS: 2.1 10*3/uL (ref 0.7–4.0)
Lymphocytes Relative: 33 % (ref 12–46)
MCH: 29.4 pg (ref 26.0–34.0)
MCHC: 34.5 g/dL (ref 30.0–36.0)
MCV: 85.1 fL (ref 78.0–100.0)
MONO ABS: 0.3 10*3/uL (ref 0.1–1.0)
MONOS PCT: 5 % (ref 3–12)
NEUTROS PCT: 59 % (ref 43–77)
Neutro Abs: 3.8 10*3/uL (ref 1.7–7.7)
Platelets: 293 10*3/uL (ref 150–400)
RBC: 3.88 MIL/uL (ref 3.87–5.11)
RDW: 14.7 % (ref 11.5–15.5)
RH TYPE: POSITIVE
Rubella: 5.21 Index — ABNORMAL HIGH (ref <0.90)
WBC: 6.4 10*3/uL (ref 4.0–10.5)

## 2013-06-20 LAB — PRESCRIPTION MONITORING PROFILE (19 PANEL)
Amphetamine/Meth: NEGATIVE ng/mL
BARBITURATE SCREEN, URINE: NEGATIVE ng/mL
BENZODIAZEPINE SCREEN, URINE: NEGATIVE ng/mL
Buprenorphine, Urine: NEGATIVE ng/mL
CANNABINOID SCRN UR: NEGATIVE ng/mL
COCAINE METABOLITES: NEGATIVE ng/mL
CREATININE, URINE: 212.89 mg/dL (ref >20.0)
Carisoprodol, Urine: NEGATIVE ng/mL
FENTANYL URINE: NEGATIVE ng/mL
MDMA URINE: NEGATIVE ng/mL
Meperidine, Ur: NEGATIVE ng/mL
Methadone Screen, Urine: NEGATIVE ng/mL
Methaqualone: NEGATIVE ng/mL
Nitrites, Initial: NEGATIVE ug/mL
OPIATE SCREEN, URINE: NEGATIVE ng/mL
Oxycodone Screen, Ur: NEGATIVE ng/mL
PHENCYCLIDINE, UR: NEGATIVE ng/mL
Propoxyphene: NEGATIVE ng/mL
Tapentadol, urine: NEGATIVE ng/mL
Tramadol Scrn, Ur: NEGATIVE ng/mL
ZOLPIDEM, URINE: NEGATIVE ng/mL
pH, Initial: 7.5 pH (ref 4.5–8.9)

## 2013-06-21 LAB — CULTURE, OB URINE: Colony Count: 50000

## 2013-06-21 LAB — HEMOGLOBINOPATHY EVALUATION
HEMOGLOBIN OTHER: 0 %
HGB S QUANTITAION: 0 %
Hgb A2 Quant: 2.5 % (ref 2.2–3.2)
Hgb A: 97.5 % (ref 96.8–97.8)
Hgb F Quant: 0 % (ref 0.0–2.0)

## 2013-06-25 ENCOUNTER — Telehealth: Payer: Self-pay

## 2013-06-25 NOTE — Telephone Encounter (Signed)
Message copied by Geanie Logan on Mon Jun 25, 2013  4:42 PM ------      Message from: Lavonia Drafts      Created: Sun Jun 24, 2013 11:11 AM       Please call pt.  Repeat Urine cx.            Thx,      clh-S ------

## 2013-06-25 NOTE — Telephone Encounter (Signed)
Called with pacific interpreter [phone]. No answer. Left message stating we are calling with results please call clinic.

## 2013-06-26 NOTE — Telephone Encounter (Signed)
Attempted to call pt. With pacific interpreter [phone]. No answer. Left message for pt. Pt. Stating we are calling with results please call clinic.

## 2013-06-27 NOTE — Telephone Encounter (Signed)
Called pt with [employer] # [id number].  I informed her of need for repeat urine testing to check for possible bladder infection. She voiced understanding and agreed to come in tomorrow at 1300.

## 2013-06-28 ENCOUNTER — Ambulatory Visit (INDEPENDENT_AMBULATORY_CARE_PROVIDER_SITE_OTHER): Payer: Medicaid Other

## 2013-06-28 DIAGNOSIS — O09529 Supervision of elderly multigravida, unspecified trimester: Secondary | ICD-10-CM

## 2013-06-28 DIAGNOSIS — IMO0002 Reserved for concepts with insufficient information to code with codable children: Secondary | ICD-10-CM

## 2013-06-28 LAB — POCT URINALYSIS DIP (DEVICE)
BILIRUBIN URINE: NEGATIVE
GLUCOSE, UA: NEGATIVE mg/dL
KETONES UR: NEGATIVE mg/dL
LEUKOCYTES UA: NEGATIVE
Nitrite: NEGATIVE
Protein, ur: NEGATIVE mg/dL
Specific Gravity, Urine: 1.025 (ref 1.005–1.030)
Urobilinogen, UA: 0.2 mg/dL (ref 0.0–1.0)
pH: 5 (ref 5.0–8.0)

## 2013-06-28 NOTE — Progress Notes (Signed)
Pt. Here today per request of Dr. 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 Pt. Gave clean catch urine sample today. Sent for culture. Pt. Informed we would call her if anything was abnormal.

## 2013-06-29 LAB — CULTURE, OB URINE
COLONY COUNT: NO GROWTH
Organism ID, Bacteria: NO GROWTH

## 2013-07-08 ENCOUNTER — Emergency Department (HOSPITAL_COMMUNITY): Payer: Medicaid Other

## 2013-07-08 ENCOUNTER — Emergency Department (HOSPITAL_COMMUNITY)
Admission: EM | Admit: 2013-07-08 | Discharge: 2013-07-08 | Disposition: A | Discharge status: Home / Self Care | Payer: Medicaid Other | Attending: Emergency Medicine | Admitting: Emergency Medicine

## 2013-07-08 ENCOUNTER — Encounter (HOSPITAL_COMMUNITY): Payer: Self-pay | Admitting: Emergency Medicine

## 2013-07-08 DIAGNOSIS — R059 Cough, unspecified: Secondary | ICD-10-CM | POA: Insufficient documentation

## 2013-07-08 DIAGNOSIS — K5900 Constipation, unspecified: Secondary | ICD-10-CM | POA: Insufficient documentation

## 2013-07-08 DIAGNOSIS — R5383 Other fatigue: Secondary | ICD-10-CM

## 2013-07-08 DIAGNOSIS — R0602 Shortness of breath: Secondary | ICD-10-CM | POA: Insufficient documentation

## 2013-07-08 DIAGNOSIS — R5381 Other malaise: Secondary | ICD-10-CM | POA: Insufficient documentation

## 2013-07-08 DIAGNOSIS — R1084 Generalized abdominal pain: Secondary | ICD-10-CM | POA: Insufficient documentation

## 2013-07-08 DIAGNOSIS — O26899 Other specified pregnancy related conditions, unspecified trimester: Secondary | ICD-10-CM

## 2013-07-08 DIAGNOSIS — R300 Dysuria: Secondary | ICD-10-CM | POA: Insufficient documentation

## 2013-07-08 DIAGNOSIS — O210 Mild hyperemesis gravidarum: Secondary | ICD-10-CM | POA: Insufficient documentation

## 2013-07-08 DIAGNOSIS — Z79899 Other long term (current) drug therapy: Secondary | ICD-10-CM | POA: Insufficient documentation

## 2013-07-08 DIAGNOSIS — O99019 Anemia complicating pregnancy, unspecified trimester: Secondary | ICD-10-CM | POA: Insufficient documentation

## 2013-07-08 DIAGNOSIS — O9989 Other specified diseases and conditions complicating pregnancy, childbirth and the puerperium: Secondary | ICD-10-CM | POA: Insufficient documentation

## 2013-07-08 DIAGNOSIS — R05 Cough: Secondary | ICD-10-CM

## 2013-07-08 DIAGNOSIS — R109 Unspecified abdominal pain: Secondary | ICD-10-CM

## 2013-07-08 DIAGNOSIS — R350 Frequency of micturition: Secondary | ICD-10-CM | POA: Insufficient documentation

## 2013-07-08 LAB — COMPREHENSIVE METABOLIC PANEL
ALT: 8 U/L (ref 0–35)
AST: 12 U/L (ref 0–37)
Albumin: 3 g/dL — ABNORMAL LOW (ref 3.5–5.2)
Alkaline Phosphatase: 54 U/L (ref 39–117)
BUN: 8 mg/dL (ref 6–23)
CHLORIDE: 101 meq/L (ref 96–112)
CO2: 21 meq/L (ref 19–32)
Calcium: 9 mg/dL (ref 8.4–10.5)
Creatinine, Ser: 0.33 mg/dL — ABNORMAL LOW (ref 0.50–1.10)
GFR calc Af Amer: >90 mL/min (ref >90)
Glucose, Bld: 84 mg/dL (ref 70–99)
Potassium: 3.4 mEq/L — ABNORMAL LOW (ref 3.7–5.3)
SODIUM: 137 meq/L (ref 137–147)
Total Protein: 7 g/dL (ref 6.0–8.3)

## 2013-07-08 LAB — URINALYSIS, ROUTINE W REFLEX MICROSCOPIC
Bilirubin Urine: NEGATIVE
GLUCOSE, UA: NEGATIVE mg/dL
Ketones, ur: NEGATIVE mg/dL
Leukocytes, UA: NEGATIVE
Nitrite: NEGATIVE
Protein, ur: NEGATIVE mg/dL
SPECIFIC GRAVITY, URINE: 1.023 (ref 1.005–1.030)
Urobilinogen, UA: 0.2 mg/dL (ref 0.0–1.0)
pH: 7.5 (ref 5.0–8.0)

## 2013-07-08 LAB — OB RESULTS CONSOLE GC/CHLAMYDIA
Chlamydia: NEGATIVE
Gonorrhea: NEGATIVE

## 2013-07-08 LAB — LIPASE, BLOOD: Lipase: 69 U/L — ABNORMAL HIGH (ref 11–59)

## 2013-07-08 LAB — URINE MICROSCOPIC-ADD ON

## 2013-07-08 LAB — WET PREP, GENITAL
Clue Cells Wet Prep HPF POC: NONE SEEN
Trich, Wet Prep: NONE SEEN
WBC WET PREP: NONE SEEN
YEAST WET PREP: NONE SEEN

## 2013-07-08 LAB — HCG, QUANTITATIVE, PREGNANCY: hCG, Beta Chain, Quant, S: 26125 m[IU]/mL — ABNORMAL HIGH (ref <5)

## 2013-07-08 LAB — POC URINE PREG, ED: PREG TEST UR: POSITIVE — AB

## 2013-07-08 NOTE — ED Notes (Signed)
Attempted to draw pts labs was unsuccessful  

## 2013-07-08 NOTE — ED Provider Notes (Signed)
Care assumed from [facility], PA-C at shift change. Pt 4 months pregnant, presenting with cough x 4 months intermittent, CXR pending. Also with abdominal pain, pelvic US pending. If normal, d/c home with OB f/u.  5:02 PM Pelvic US showing uncomplicated intrauterine pregnancy. CXR clear. She is resting comfortably, NAD. Stable for d/c. F/u with OB/GYN. Advised to return to [facility] if symptoms worsen. Return precautions given. Patient states understanding of treatment care plan and is agreeable.  Dg Chest 1 View  07/08/2013   CLINICAL DATA:  Chest pain and cough.  Second trimester pregnancy.  EXAM: CHEST - 1 VIEW  COMPARISON:  None.  FINDINGS: The heart size and mediastinal contours are within normal limits. Both lungs are clear. The visualized skeletal structures are unremarkable.  IMPRESSION: No active disease.   Electronically Signed   By: Sherryl Barters M.D.   On: 07/08/2013 16:36   US Ob Limited  07/08/2013   CLINICAL DATA:  Abdominal pain.  EXAM: LIMITED OBSTETRIC ULTRASOUND  FINDINGS: Number of Fetuses: 1  Heart Rate:  144 bpm  Movement: Yes  Presentation: Transverse with the head right.  Placental Location: Anterior  Previa: No  Amniotic Fluid (Subjective):  Within normal limits.  BPD:  3.6cm 17w  0d  MATERNAL FINDINGS:  Cervix:  Appears closed.  Uterus/Adnexae:  No abnormality visualized.  IMPRESSION: Uncomplicated single intrauterine pregnancy.  This exam is performed on an emergent basis and does not comprehensively evaluate fetal size, dating, or anatomy; follow-up complete OB US should be considered if further fetal assessment is warranted.   Electronically Signed   By: Dereck Ligas M.D.   On: 07/08/2013 16:38    Illene Labrador, PA-C 07/08/13 1704

## 2013-07-08 NOTE — ED Provider Notes (Signed)
Medical screening examination/treatment/procedure(s) were performed by non-physician practitioner and as supervising physician I was immediately available for consultation/collaboration.   EKG Interpretation None       Orlie Dakin, MD 07/08/13 1702

## 2013-07-08 NOTE — Discharge Instructions (Signed)
Follow up with your ob/gyn. Return to [facility] if your symptoms worsen.     OB / GYN .          .              .        .       .       .         .        .       Marland Kitchen                           Marland Kitchen                .   CARE      .           :              .        .       .         .                   .          Kirke Corin  MEDICAL CARE IF:         .     .   .     .  .     .      .          .         .         .     .           .   :   .   .             .   : 2005/03/29  : 2013/01/17   : 2012/10/26 ExitCare    2014   Abdominal Pain During Pregnancy Abdominal pain is common in pregnancy. Most of the time, it does  not cause harm. There are many causes of abdominal pain. Some causes are more serious than others. Some of the causes of abdominal pain in pregnancy are easily diagnosed. Occasionally, the diagnosis takes time to understand. Other times, the cause is not determined. Abdominal pain can be a sign that something is very wrong with the pregnancy, or the pain may have nothing to do with the pregnancy at all. For this reason, always tell your health care provider if you have any abdominal discomfort. HOME CARE INSTRUCTIONS  Monitor your abdominal pain for any changes. The following actions may help to alleviate any discomfort you are experiencing:  Do not have sexual intercourse or put anything in your vagina until your symptoms go away completely.  Get plenty of rest until your pain improves.  Drink clear fluids if you feel nauseous. Avoid solid food as long as you are uncomfortable or nauseous.  Only take over-the-counter or prescription medicine as directed by your health care provider.  Keep all follow-up appointments with your health care provider. SEEK IMMEDIATE MEDICAL CARE IF:  You are bleeding, leaking fluid, or passing tissue from the vagina.  You have increasing pain or cramping.  You have persistent vomiting.  You have painful or bloody urination.  You have a fever.  You notice a decrease in your baby's movements.  You have extreme weakness or feel faint.  You  have shortness of breath, with or without abdominal pain.  You develop a severe headache with abdominal pain.  You have abnormal vaginal discharge with abdominal pain.  You have persistent diarrhea.  You have abdominal pain that continues even after rest, or gets worse. MAKE SURE YOU:   Understand these instructions.  Will watch your condition.  Will get help right away if you are not doing well or get worse. Document Released: 03/29/2005 Document Revised: 01/17/2013 Document Reviewed: 10/26/2012 [facility]  Patient Information 2014 [place], [place].

## 2013-07-08 NOTE — ED Provider Notes (Signed)
Medical screening examination/treatment/procedure(s) were performed by non-physician practitioner and as supervising physician I was immediately available for consultation/collaboration.     Brennon Otterness, MD 07/08/13 2331 

## 2013-07-08 NOTE — ED Notes (Signed)
Pt reports having a productive cough x 1 month with white sputum. Having upper abd pain, reports being approx 4 months pregnant and having generalized fatigue.

## 2013-07-08 NOTE — ED Provider Notes (Signed)
CSN: [id number]     Arrival date & time 07/08/13  1302 History   First MD Initiated Contact with Patient 07/08/13 1410     Chief Complaint  Patient presents with  . Cough  . Abdominal Pain     (Consider location/radiation/quality/duration/timing/severity/associated sxs/prior Treatment) HPI  Spoke with patient through [employer] [place] interpreter.   Pregnant patient p/w upper and lower abdominal pain, generalized fatigue.  Abdominal pain is described as cramping, upper and lower pain, comes and goes, lasts 15-20 minutes at a time - previously 3-4 times daily now approximately twice an hour.  It does not feel like contractions.  Has had dysuria (burning) and urinary frequency.  Denies vaginal bleeding or discharge, and fluid coming from the vagina.  Has felt constipated recently, last BM was this morning, had a thin sheet of brown mucus with it.   Has had N/V since she became pregnant, this is unchanged.  Pt has seen her OB for the first time recently, has not had an ultrasound. Pt is eating and drinking well, but has not yet eaten today.   Patient has had intermittent cough for the past 3-4 months.  The cough comes and goes.  Productive of sputum, looks like mucus.  She is SOB.  Denies CP, fever.   Denies sick contacts.  Denies travel.  Had negative TB test when she moved to this country 2 years ago.   Past Medical History  Diagnosis Date  . Anemia   . Neonatal death     Vaginal delivery, full term-lived x2 hours.   Marland Kitchen Spinal headache   . IUD (intrauterine device) in place 09/03/2011    Due out in 07/2016   Past Surgical History  Procedure Laterality Date  . Cesarean section    . Adenoidectomy      as a child  . Boil  2003    right elbow  . Cesarean section  06/02/2011    Procedure: CESAREAN SECTION;  Surgeon: [name], MD;  Location: [facility];  Service: Gynecology;  Laterality: N/A;  Primary Cesarean Section Delivery Baby Boy @ 0004, Apgars 9/9   Family History   Problem Relation Age of Onset  . Anesthesia problems Neg Hx   . Diabetes Mother   . Diabetes Father   . Diabetes Sister    History  Substance Use Topics  . Smoking status: Never Smoker   . Smokeless tobacco: Never Used  . Alcohol Use: No   OB History   Grav Para Term Preterm Abortions TAB SAB Ect Mult Living   5 4 4       3      Review of Systems  Constitutional: Negative for fever, activity change and appetite change.  Respiratory: Positive for cough and shortness of breath.   Cardiovascular: Negative for chest pain.  Gastrointestinal: Positive for abdominal pain and constipation. Negative for nausea, vomiting, diarrhea and blood in stool.  Genitourinary: Positive for dysuria and frequency. Negative for urgency, vaginal bleeding and vaginal discharge.  All other systems reviewed and are negative.      Allergies  Review of patient's allergies indicates no known allergies.  Home Medications   Current Outpatient Rx  Name  Route  Sig  Dispense  Refill  . folic acid (FOLVITE) 1 MG tablet   Oral   Take 1 mg by mouth daily.         . Prenatal Multivit-Min-Fe-FA (PRENATAL VITAMINS) 0.8 MG tablet   Oral   Take 1 tablet by  mouth daily.   30 tablet   12    BP 104/64  Pulse 98  Temp(Src) 98.3 F (36.8 C) (Oral)  Resp 18  SpO2 99%  LMP 03/12/2013 Physical Exam  Nursing note and vitals reviewed. Constitutional: She appears well-developed and well-nourished. No distress.  HENT:  Head: Normocephalic and atraumatic.  Neck: Neck supple.  Cardiovascular: Normal rate and regular rhythm.   Pulmonary/Chest: Effort normal and breath sounds normal. No respiratory distress. She has no wheezes. She has no rales.  Coughs with deep inspiration.  Frequent mild cough.   Abdominal: Soft. She exhibits no distension. There is generalized tenderness. There is no rebound and no guarding.  Gravid  Genitourinary: Vagina normal. There is no rash, tenderness, lesion or injury on the  right labia. There is no rash, tenderness, lesion or injury on the left labia. Uterus is enlarged and tender. Cervix exhibits no motion tenderness and no discharge. Right adnexum displays tenderness. Left adnexum displays tenderness.  Fundus just below umbilicus   Neurological: She is alert.  Skin: She is not diaphoretic.    ED Course  Procedures (including critical care time) Labs Review Labs Reviewed  URINALYSIS, ROUTINE W REFLEX MICROSCOPIC - Abnormal; Notable for the following:    APPearance TURBID (*)    Hgb urine dipstick TRACE (*)    All other components within normal limits  COMPREHENSIVE METABOLIC PANEL - Abnormal; Notable for the following:    Potassium 3.4 (*)    Creatinine, Ser 0.33 (*)    Albumin 3.0 (*)    Total Bilirubin <0.2 (*)    All other components within normal limits  LIPASE, BLOOD - Abnormal; Notable for the following:    Lipase 69 (*)    All other components within normal limits  POC URINE PREG, ED - Abnormal; Notable for the following:    Preg Test, Ur POSITIVE (*)    All other components within normal limits  WET PREP, GENITAL  GC/CHLAMYDIA PROBE AMP  URINE CULTURE  URINE MICROSCOPIC-ADD ON  CBC WITH DIFFERENTIAL  HCG, QUANTITATIVE, PREGNANCY   Imaging Review No results found.   EKG Interpretation None      3:13 PM Discussed pt with Dr Winfred Leeds.    MDM   Final diagnoses:  None    Pregnant patient with two complaints.  She has had an intermittent cough x 3-4 months and is feeling SOB.  O2 sat is 100% on room air, she has no chest pain.  Plan is for CXR.  If negative, d/c home with OB follow up.  Doubt PE.    Pt also has abdominal pain.  This has been ongoing for 5 days.  It is located in the upper and lower abdomen and is crampy.  She is having urinary symptoms but UA is unremarkable.  Urine culture pending.  Pt denies any vaginal discharge or bleeding and there is no discharge/fluid/blood on pelvic exam.  She has generalized tenderness  -  not more tender in any one area than another.  She is afebrile, her WBC is normal, she is not anorexic, not vomiting.  Doubt appendicitis.  She has had one OB appointment, has not yet had an ultrasound.  Labs are unremarkable at this point.  Plan is for OB US, if normal and unconcerning, pt to be reevaluated and anticipate discharge home.    Discussed with Michele Mcalpine, PA-C, at change of shift.      Clayton Bibles, PA-C 07/08/13 1620

## 2013-07-09 LAB — GC/CHLAMYDIA PROBE AMP
CT PROBE, AMP APTIMA: NEGATIVE
GC Probe RNA: NEGATIVE

## 2013-07-09 LAB — URINE CULTURE
COLONY COUNT: NO GROWTH
Culture: NO GROWTH

## 2013-07-19 ENCOUNTER — Ambulatory Visit (INDEPENDENT_AMBULATORY_CARE_PROVIDER_SITE_OTHER): Payer: Medicaid Other | Admitting: Family Medicine

## 2013-07-19 ENCOUNTER — Encounter: Payer: Self-pay | Admitting: Family Medicine

## 2013-07-19 VITALS — BP 111/78 | Wt 134.9 lb

## 2013-07-19 DIAGNOSIS — O34219 Maternal care for unspecified type scar from previous cesarean delivery: Secondary | ICD-10-CM

## 2013-07-19 DIAGNOSIS — O09529 Supervision of elderly multigravida, unspecified trimester: Secondary | ICD-10-CM

## 2013-07-19 DIAGNOSIS — IMO0002 Reserved for concepts with insufficient information to code with codable children: Secondary | ICD-10-CM

## 2013-07-19 LAB — POCT URINALYSIS DIP (DEVICE)
Bilirubin Urine: NEGATIVE
GLUCOSE, UA: NEGATIVE mg/dL
Ketones, ur: NEGATIVE mg/dL
Leukocytes, UA: NEGATIVE
NITRITE: NEGATIVE
Protein, ur: NEGATIVE mg/dL
Specific Gravity, Urine: 1.02 (ref 1.005–1.030)
UROBILINOGEN UA: 0.2 mg/dL (ref 0.0–1.0)
pH: 7 (ref 5.0–8.0)

## 2013-07-19 MED ORDER — LORATADINE 10 MG PO TABS: 10.0000 mg | ORAL_TABLET | Freq: Every day | ORAL | Status: DC

## 2013-07-19 NOTE — Progress Notes (Signed)
S: 38 yo G5P4003 @ [redacted]w[redacted]d here for ROBV - complaining of right sided pain - seen in the ED on 3/29 and had a UA and labs that were unremarkable  - No vb, lof, Ctx.   O: see flowsheet  A/P - pain low and pelvic and associated with standing for long periods of time. Likely related to scar tissue or round ligament pain. Abdominal binder discussed.  - quad screen discussed and declined by mother and father - allergies- rx for claritin provided - anatomy US scheduled today  - f/u in 1 month

## 2013-07-19 NOTE — Patient Instructions (Signed)
Second Trimester of Pregnancy The second trimester is from week 13 through week 28, months 4 through 6. The second trimester is often a time when you feel your best. Your body has also adjusted to being pregnant, and you begin to feel better physically. Usually, morning sickness has lessened or quit completely, you may have more energy, and you may have an increase in appetite. The second trimester is also a time when the fetus is growing rapidly. At the end of the sixth month, the fetus is about 9 inches long and weighs about 1 pounds. You will likely begin to feel the baby move (quickening) between 18 and 20 weeks of the pregnancy. BODY CHANGES Your body goes through many changes during pregnancy. The changes vary from woman to woman.   Your weight will continue to increase. You will notice your lower abdomen bulging out.  You may begin to get stretch marks on your hips, abdomen, and breasts.  You may develop headaches that can be relieved by medicines approved by your caregiver.  You may urinate more often because the fetus is pressing on your bladder.  You may develop or continue to have heartburn as a result of your pregnancy.  You may develop constipation because certain hormones are causing the muscles that push waste through your intestines to slow down.  You may develop hemorrhoids or swollen, bulging veins (varicose veins).  You may have back pain because of the weight gain and pregnancy hormones relaxing your joints between the bones in your pelvis and as a result of a shift in weight and the muscles that support your balance.  Your breasts will continue to grow and be tender.  Your gums may bleed and may be sensitive to brushing and flossing.  Dark spots or blotches (chloasma, mask of pregnancy) may develop on your face. This will likely fade after the baby is born.  A dark line from your belly button to the pubic area (linea nigra) may appear. This will likely fade after the  baby is born. WHAT TO EXPECT AT YOUR PRENATAL VISITS During a routine prenatal visit:  You will be weighed to make sure you and the fetus are growing normally.  Your blood pressure will be taken.  Your abdomen will be measured to track your baby's growth.  The fetal heartbeat will be listened to.  Any test results from the previous visit will be discussed. Your caregiver may ask you:  How you are feeling.  If you are feeling the baby move.  If you have had any abnormal symptoms, such as leaking fluid, bleeding, severe headaches, or abdominal cramping.  If you have any questions. Other tests that may be performed during your second trimester include:  Blood tests that check for:  Low iron levels (anemia).  Gestational diabetes (between 24 and 28 weeks).  Rh antibodies.  Urine tests to check for infections, diabetes, or protein in the urine.  An ultrasound to confirm the proper growth and development of the baby.  An amniocentesis to check for possible genetic problems.  Fetal screens for spina bifida and Down syndrome. HOME CARE INSTRUCTIONS   Avoid all smoking, herbs, alcohol, and unprescribed drugs. These chemicals affect the formation and growth of the baby.  Follow your caregiver's instructions regarding medicine use. There are medicines that are either safe or unsafe to take during pregnancy.  Exercise only as directed by your caregiver. Experiencing uterine cramps is a good sign to stop exercising.  Continue to eat regular,   healthy meals.  Wear a good support bra for breast tenderness.  Do not use hot tubs, steam rooms, or saunas.  Wear your seat belt at all times when driving.  Avoid raw meat, uncooked cheese, cat litter boxes, and soil used by cats. These carry germs that can cause birth defects in the baby.  Take your prenatal vitamins.  Try taking a stool softener (if your caregiver approves) if you develop constipation. Eat more high-fiber foods,  such as fresh vegetables or fruit and whole grains. Drink plenty of fluids to keep your urine clear or pale yellow.  Take warm sitz baths to soothe any pain or discomfort caused by hemorrhoids. Use hemorrhoid cream if your caregiver approves.  If you develop varicose veins, wear support hose. Elevate your feet for 15 minutes, 3 4 times a day. Limit salt in your diet.  Avoid heavy lifting, wear low heel shoes, and practice good posture.  Rest with your legs elevated if you have leg cramps or low back pain.  Visit your dentist if you have not gone yet during your pregnancy. Use a soft toothbrush to brush your teeth and be gentle when you floss.  A sexual relationship may be continued unless your caregiver directs you otherwise.  Continue to go to all your prenatal visits as directed by your caregiver. SEEK MEDICAL CARE IF:   You have dizziness.  You have mild pelvic cramps, pelvic pressure, or nagging pain in the abdominal area.  You have persistent nausea, vomiting, or diarrhea.  You have a bad smelling vaginal discharge.  You have pain with urination. SEEK IMMEDIATE MEDICAL CARE IF:   You have a fever.  You are leaking fluid from your vagina.  You have spotting or bleeding from your vagina.  You have severe abdominal cramping or pain.  You have rapid weight gain or loss.  You have shortness of breath with chest pain.  You notice sudden or extreme swelling of your face, hands, ankles, feet, or legs.  You have not felt your baby move in over an hour.  You have severe headaches that do not go away with medicine.  You have vision changes. Document Released: 03/23/2001 Document Revised: 11/29/2012 Document Reviewed: 05/30/2012 ExitCare Patient Information 2014 [employer].  

## 2013-07-19 NOTE — Progress Notes (Signed)
P=99,   States having pain RLQ, went to ER- they told her nothing wrong- still having that same pain.

## 2013-07-19 NOTE — Progress Notes (Signed)
U/S scheduled 08/03/13 at 11 am in [facility].

## 2013-08-03 ENCOUNTER — Ambulatory Visit (HOSPITAL_COMMUNITY)
Admission: RE | Admit: 2013-08-03 | Discharge: 2013-08-03 | Disposition: A | Discharge status: Home / Self Care | Payer: Medicaid Other | Source: Ambulatory Visit | Attending: Family Medicine | Admitting: Family Medicine

## 2013-08-03 DIAGNOSIS — O09529 Supervision of elderly multigravida, unspecified trimester: Secondary | ICD-10-CM | POA: Insufficient documentation

## 2013-08-03 DIAGNOSIS — Z3689 Encounter for other specified antenatal screening: Secondary | ICD-10-CM | POA: Insufficient documentation

## 2013-08-03 DIAGNOSIS — IMO0002 Reserved for concepts with insufficient information to code with codable children: Secondary | ICD-10-CM

## 2013-08-03 DIAGNOSIS — O34219 Maternal care for unspecified type scar from previous cesarean delivery: Secondary | ICD-10-CM

## 2013-08-09 ENCOUNTER — Encounter: Payer: Self-pay | Admitting: Family Medicine

## 2013-08-13 IMAGING — US US OB COMP +14 WK
1 series · 12 of 28 positions shown · non-contrast
Comparison: none

[Series 1: us ob detail +14 wk · 12 of 80 slices shown]
[im 3/80]
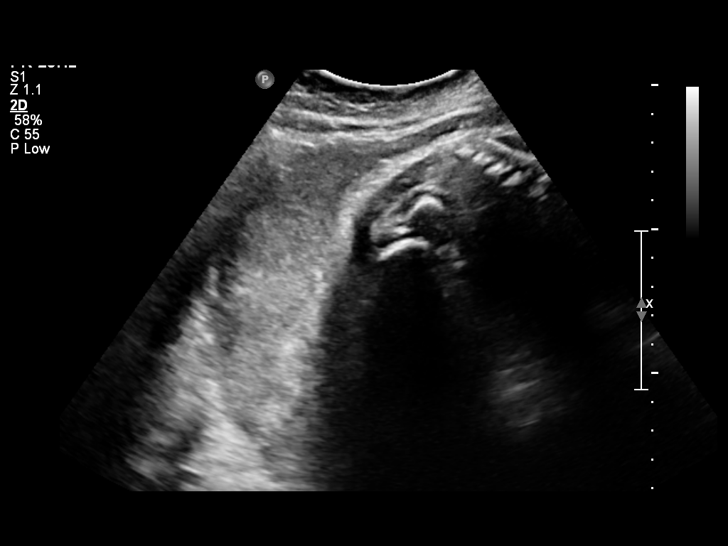
[im 9/80]
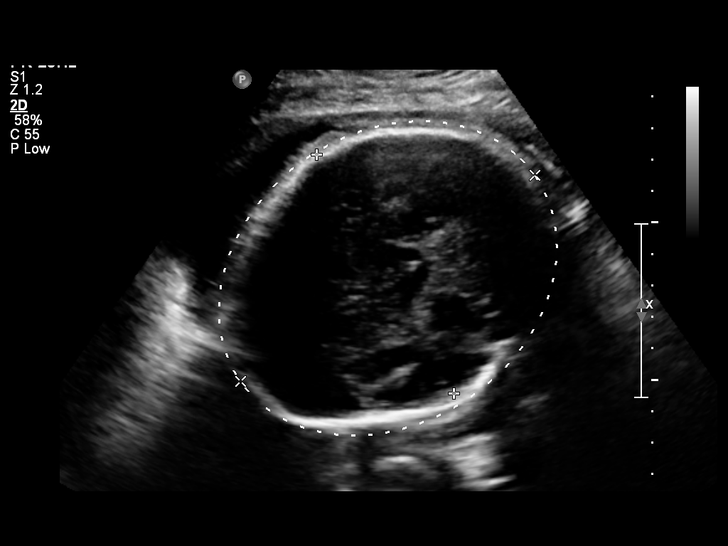
[im 15/80]
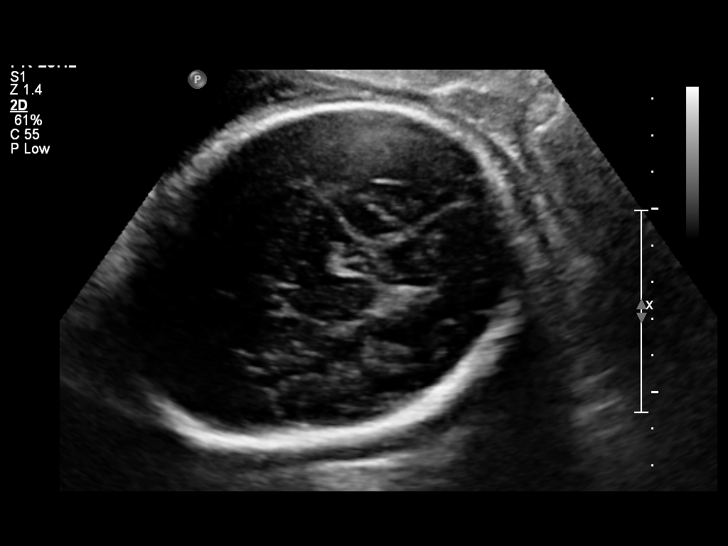
[im 24/80]
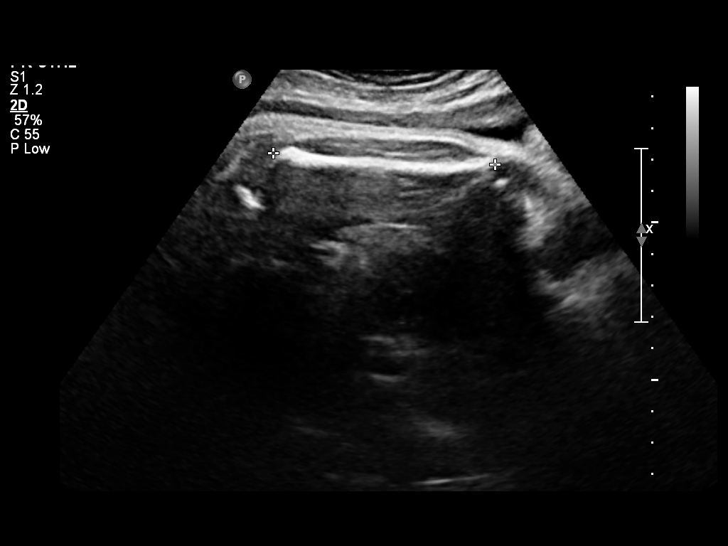
[im 30/80]
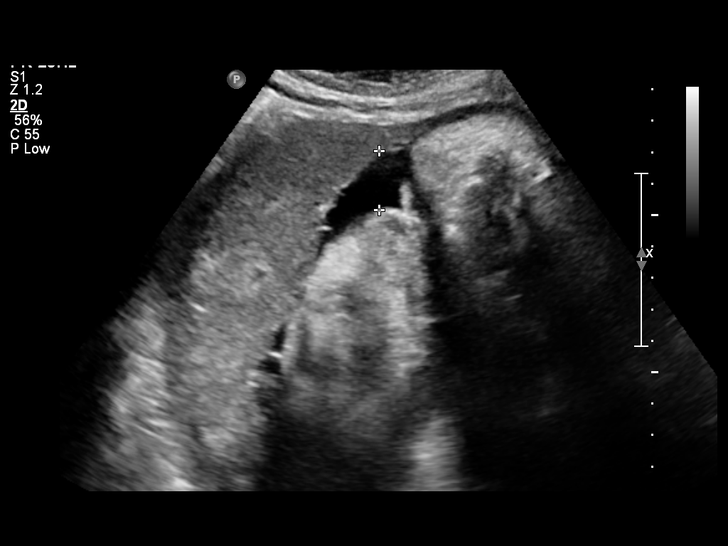
[im 36/80]
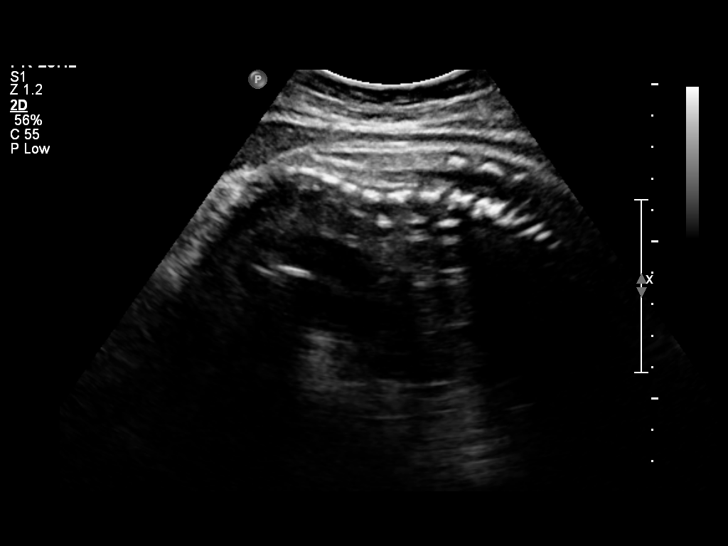
[im 44/80]
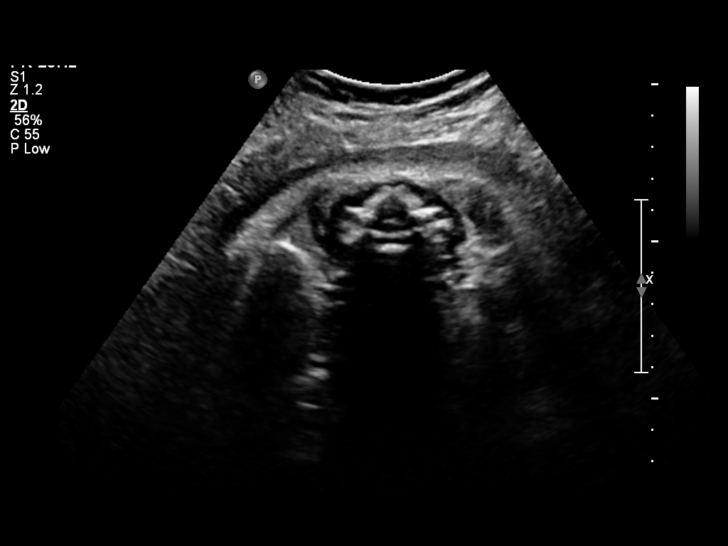
[im 50/80]
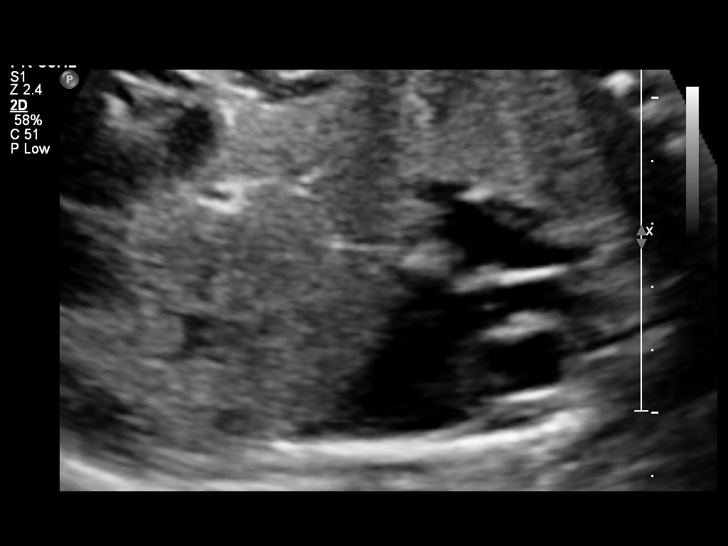
[im 56/80]
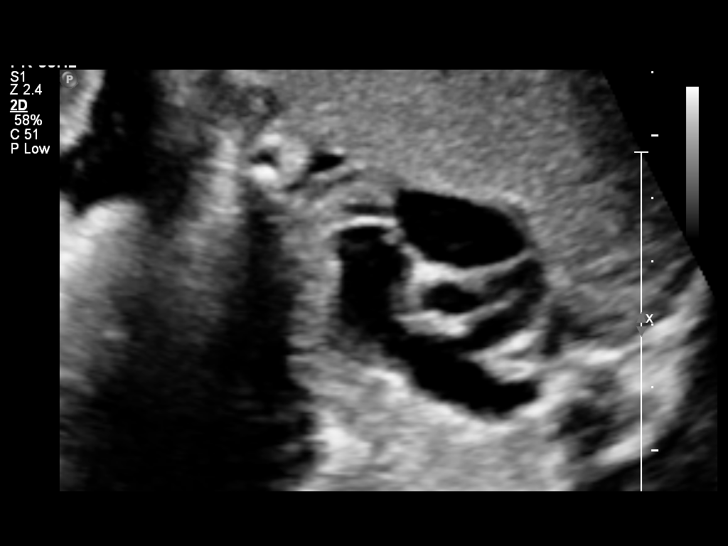
[im 65/80]
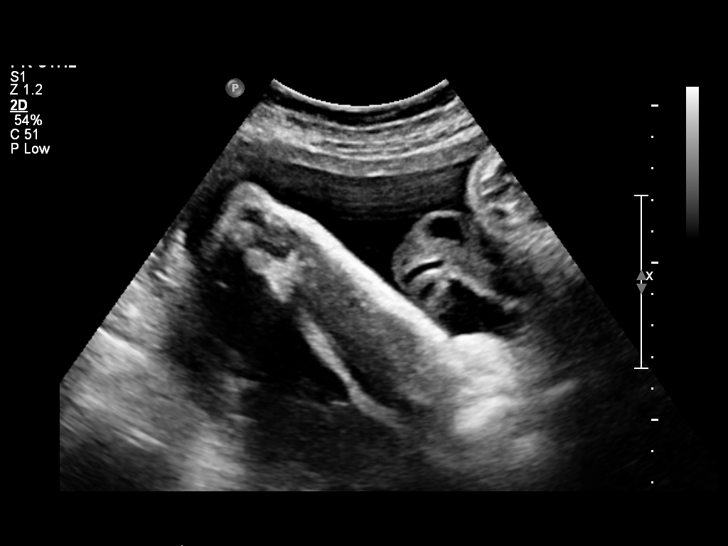
[im 71/80]
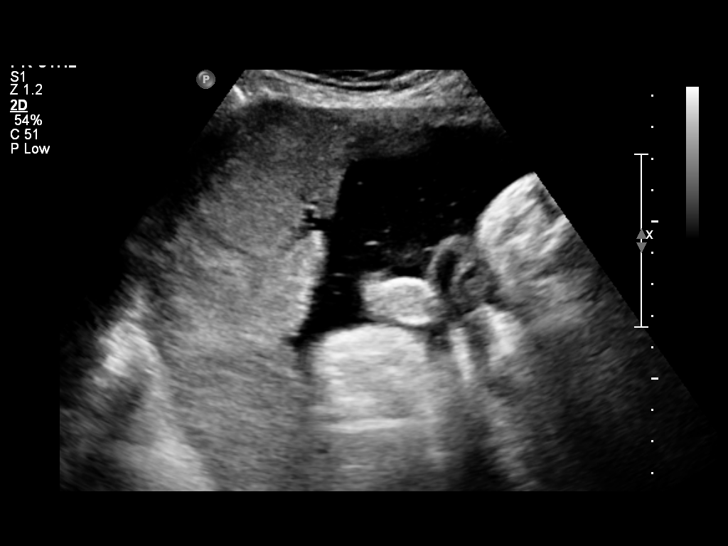
[im 77/80]
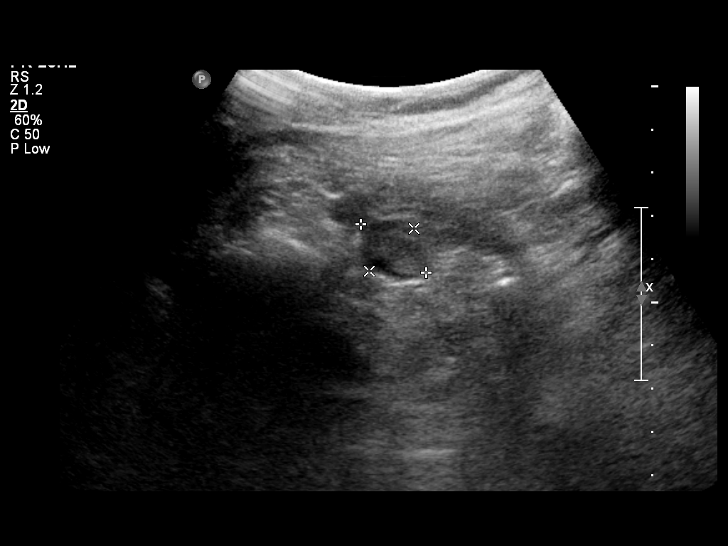

[12 of 28 positions shown; findings below may reference images not displayed]

OBSTETRICS REPORT
                      (Signed Final 05/10/2011 [DATE])

Procedures

 US OB Comp + 14 WK                                    76805.1
Indications

 Pain - RLQ
Fetal Evaluation

 Fetal Heart Rate:  160                         bpm
 Cardiac Activity:  Observed
 Presentation:      Cephalic
 Placenta:          Posterior Fundal, above
                    cervical os

 Amniotic Fluid
 AFI FV:      Subjectively low-normal
 AFI Sum:     10.3    cm      24   %Tile     Larg Pckt:   5.08   cm
 RUQ:   5.08   cm    RLQ:    1.87   cm    LLQ:   3.35    cm
Biometry

 BPD:       88  mm    G. Age:   35w 4d                CI:        74.26   70 - 86
                                                      FL/HC:      21.3   20.1 -

 HC:     324.2  mm    G. Age:   36w 5d       33  %    HC/AC:      0.98   0.93 -

 AC:     331.7  mm    G. Age:   37w 0d       83  %    FL/BPD:     78.5   71 - 87
 FL:      69.1  mm    G. Age:   35w 3d       29  %    FL/AC:      20.8   20 - 24

 Est. FW:    2788  gm      6 lb 8 oz     72  %
Gestational Age

 Clinical EDD:  36w 1d                                        EDD:   06/06/11
 U/S Today:     36w 1d                                        EDD:   06/06/11
 Best:          36w 1d    Det. By:   Clinical EDD             EDD:   06/06/11
Anatomy

 Cranium:           Appears normal      Aortic Arch:       Not well
                                                           visualized
 Fetal Cavum:       Appears normal      Ductal Arch:       Appears normal
 Ventricles:        Appears normal      Diaphragm:         Not well
                                                           visualized
 Choroid Plexus:    Not well            Stomach:           Appears normal
                    visualized
 Cerebellum:        Not well            Abdomen:           Appears normal
                    visualized
 Posterior Fossa:   Not well            Abdominal Wall:    Appears nml
                    visualized                             (cord insert,
                                                           abd wall)
 Nuchal Fold:       Not applicable      Cord Vessels:      Appears normal
                    (>20 wks GA)                           (3 vessel cord)
 Face:              Appears normal      Kidneys:           Appear normal
                    /profile/orbits)
 Heart:             Appears normal      Bladder:           Appears normal
                    (4 chamber &
                    axis)
 RVOT:              Appears normal      Spine:             Appears normal
 LVOT:              Appears normal      Limbs:             three
                                                           extremities
                                                           seen. LUE not
                                                           seen

 Other:     Technically difficult due to advanced GA and fetal
            position. Fetus appears to be a male.
Cervix Uterus Adnexa

 Cervix:       Not visualized (advanced GA >34 wks)
 Left Ovary:   Within normal limits.
 Right Ovary:  Not visualized.
 Adnexa:     No adnexal mass visualized.
Impression

 Single live IUP in cephalic presentation.  Concordant
 measurements/assigned GA by LMP.
 No late-developing anomaly in visualized structures above.

 questions or concerns.

## 2013-08-17 ENCOUNTER — Encounter: Payer: Self-pay | Admitting: Obstetrics & Gynecology

## 2013-08-17 ENCOUNTER — Ambulatory Visit (INDEPENDENT_AMBULATORY_CARE_PROVIDER_SITE_OTHER): Payer: Medicaid Other | Admitting: Advanced Practice Midwife

## 2013-08-17 VITALS — BP 104/74 | HR 98 | Temp 98.6°F | Wt 134.9 lb

## 2013-08-17 DIAGNOSIS — IMO0002 Reserved for concepts with insufficient information to code with codable children: Secondary | ICD-10-CM

## 2013-08-17 DIAGNOSIS — O09529 Supervision of elderly multigravida, unspecified trimester: Secondary | ICD-10-CM

## 2013-08-17 DIAGNOSIS — O219 Vomiting of pregnancy, unspecified: Secondary | ICD-10-CM

## 2013-08-17 LAB — POCT URINALYSIS DIP (DEVICE)
BILIRUBIN URINE: NEGATIVE
Glucose, UA: NEGATIVE mg/dL
Ketones, ur: NEGATIVE mg/dL
Leukocytes, UA: NEGATIVE
NITRITE: NEGATIVE
PH: 6 (ref 5.0–8.0)
PROTEIN: NEGATIVE mg/dL
Specific Gravity, Urine: 1.02 (ref 1.005–1.030)
UROBILINOGEN UA: 0.2 mg/dL (ref 0.0–1.0)

## 2013-08-17 MED ORDER — PROMETHAZINE HCL 25 MG PO TABS: 12.5000 mg | ORAL_TABLET | Freq: Four times a day (QID) | ORAL | Status: DC | PRN

## 2013-08-17 NOTE — Patient Instructions (Signed)
Recommend pregnancy support belt for back/abdominal pain.     Dental Assistance:  If unable to pay or uninsured, contact: [facility]. to become qualified for the adult dental clinic. Patient must be enrolled in [facility] (uninsured, 0-200% FPL, qualifying info).  Enroll in [facility] first, then see Primary Care Physician assigned to you, the PCP makes a dental referral. [facility] will receive referral and contacts patient for appointment.  Patients with Medicaid           [address] [place] (Children up to 7 + Pregnant Women) - [phone]  [place] - Suite [phone]  If unable to pay, or uninsured: contact [facility] [phone] in [place] - ([place] only + Pregnant Women), [phone] in [place] only) to become qualified for the adult dental clinic  Must see if eligible to enroll in [place] before enrolling into the [facility] (exemption required) [phone] for an appointment)  [email or web address];   [phone].  If not eligible for ACA, then go by Department of Health and Human Services to see if eligible for "orange card."  [address], [place] and [place].  Once you get an orange card, you will have a Primary Care home who will then refer you to dental if needed.        Other Scientist, forensic:   [facility] [phone] (ext [phone])   [address]  Dr. Donn Pierini - [phone]   [facility]   [address] [facility]           [place], [place], [place], [address]           [phone], Ext. [phone]           2nd and 4th Thursday of the month at 6:30am (Simple extractions only - no wisdom teeth or surgery) First come/First serve -First 10 clients served           [facility] [place], [place]  and [place] residents only)          [address] [place], [place], [address]           [phone]                    [place] Department           [phone]          [place]          [facility]          [phone]

## 2013-08-17 NOTE — Progress Notes (Signed)
C/o rash in the morning everywhere but it goes away like an hour later everyday. C/o same pain as before RLQ, and lower back pain. C/o feeling sob and palpitations when resting at night when trying to sleep sometimes. C/o heartburn and vomiting at times. Oxygen sat=100%

## 2013-08-17 NOTE — Progress Notes (Signed)
Doing well.  Good fetal movement, denies vaginal bleeding, LOF, regular contractions. Rash with red bumps on face, arms, chest when she wakes up in the am that resolves within a few minutes of being awake. No change in detergent/soap per pt.  May use hydrocortisone cream PRN for itching.  Reports back pain and lower right abdomen pain with movement and sciatic nerve pain shooting down right leg.  Recommend pregnancy support belt/rest/ice/heat/Tylenol for pain.

## 2013-08-30 ENCOUNTER — Encounter: Payer: Self-pay | Admitting: Obstetrics & Gynecology

## 2013-09-13 ENCOUNTER — Ambulatory Visit (INDEPENDENT_AMBULATORY_CARE_PROVIDER_SITE_OTHER): Payer: Medicaid Other | Admitting: Advanced Practice Midwife

## 2013-09-13 VITALS — BP 111/76 | HR 109 | Temp 97.9°F | Wt 135.2 lb

## 2013-09-13 DIAGNOSIS — IMO0002 Reserved for concepts with insufficient information to code with codable children: Secondary | ICD-10-CM

## 2013-09-13 DIAGNOSIS — O09529 Supervision of elderly multigravida, unspecified trimester: Secondary | ICD-10-CM

## 2013-09-13 DIAGNOSIS — Z23 Encounter for immunization: Secondary | ICD-10-CM

## 2013-09-13 DIAGNOSIS — R0602 Shortness of breath: Secondary | ICD-10-CM

## 2013-09-13 DIAGNOSIS — Z3480 Encounter for supervision of other normal pregnancy, unspecified trimester: Secondary | ICD-10-CM

## 2013-09-13 DIAGNOSIS — Z3490 Encounter for supervision of normal pregnancy, unspecified, unspecified trimester: Secondary | ICD-10-CM

## 2013-09-13 LAB — POCT URINALYSIS DIP (DEVICE)
Bilirubin Urine: NEGATIVE
Glucose, UA: NEGATIVE mg/dL
LEUKOCYTES UA: NEGATIVE
NITRITE: NEGATIVE
PH: 6 (ref 5.0–8.0)
PROTEIN: NEGATIVE mg/dL
Specific Gravity, Urine: >=1.03 (ref 1.005–1.030)
Urobilinogen, UA: 0.2 mg/dL (ref 0.0–1.0)

## 2013-09-13 LAB — CBC
HCT: 31 % — ABNORMAL LOW (ref 36.0–46.0)
Hemoglobin: 10.5 g/dL — ABNORMAL LOW (ref 12.0–15.0)
MCH: 29.9 pg (ref 26.0–34.0)
MCHC: 33.9 g/dL (ref 30.0–36.0)
MCV: 88.3 fL (ref 78.0–100.0)
PLATELETS: 332 10*3/uL (ref 150–400)
RBC: 3.51 MIL/uL — AB (ref 3.87–5.11)
RDW: 14.3 % (ref 11.5–15.5)
WBC: 9.1 10*3/uL (ref 4.0–10.5)

## 2013-09-13 MED ORDER — TETANUS-DIPHTH-ACELL PERTUSSIS 5-2.5-18.5 LF-MCG/0.5 IM SUSP
0.5000 mL | Freq: Once | INTRAMUSCULAR | Status: AC
  Administered 2013-09-13: 0.5 mL via INTRAMUSCULAR

## 2013-09-13 NOTE — Progress Notes (Signed)
Doing well.  Good fetal movement, denies vaginal bleeding, LOF, regular contractions.  SOB x2 months, starting only when pt lying down.  Now SOB worsening and occuring in various positions.  She also reports CP described as sharp midsternal pain occuring occasionally, mostly when leaning forward.   Pt reports today that she took medications for a heart valve problem dx in [place] but stopped taking the medication during a previous pregnancy.   She is unsure of her diagnosis or the name of the medication she was on.  Lung sounds clear and equal bilaterally, heart with normal rate, heart sounds, no murmer in upright and leaning forward positions.  Referral to cardiology made today.  Pt given warning signs, reasons to go to ED.

## 2013-09-13 NOTE — Progress Notes (Signed)
Referral to [facility] scheduled for June 9th 130 pm.

## 2013-09-13 NOTE — Progress Notes (Signed)
28 wks labs today, with Tdap vaccine

## 2013-09-14 LAB — GLUCOSE TOLERANCE, 1 HOUR (50G) W/O FASTING: Glucose, 1 Hour GTT: 187 mg/dL — ABNORMAL HIGH (ref 70–140)

## 2013-09-14 LAB — RPR

## 2013-09-14 LAB — HIV ANTIBODY (ROUTINE TESTING W REFLEX): HIV 1&2 Ab, 4th Generation: NONREACTIVE

## 2013-09-17 ENCOUNTER — Telehealth: Payer: Self-pay

## 2013-09-17 NOTE — Telephone Encounter (Signed)
Message copied by Geanie Logan on Mon Sep 17, 2013 11:46 AM ------      Message from: Fatima Blank A      Created: Sat Sep 15, 2013  3:05 AM       Pt with 187 on 1 hour glucose screen.  Needs to be scheduled for 3 hour GTT as soon as possible. Thank you. ------

## 2013-09-17 NOTE — Telephone Encounter (Signed)
Patient considered to have gestational diabetes-- does not need 3hr gtt. Attempted to call patient with [facility] interpreter-- no answer. Left message stating we are calling with results, please call clinic. Will need appointment for diabetes education and high risk clinic OB FU.

## 2013-09-18 ENCOUNTER — Ambulatory Visit (INDEPENDENT_AMBULATORY_CARE_PROVIDER_SITE_OTHER): Payer: Medicaid Other | Admitting: Cardiovascular Disease

## 2013-09-18 ENCOUNTER — Encounter: Payer: Self-pay | Admitting: Cardiovascular Disease

## 2013-09-18 VITALS — BP 108/66 | HR 96 | Ht <= 58 in | Wt 137.0 lb

## 2013-09-18 DIAGNOSIS — R0602 Shortness of breath: Secondary | ICD-10-CM

## 2013-09-18 DIAGNOSIS — R002 Palpitations: Secondary | ICD-10-CM | POA: Insufficient documentation

## 2013-09-18 DIAGNOSIS — R011 Cardiac murmur, unspecified: Secondary | ICD-10-CM

## 2013-09-18 DIAGNOSIS — I38 Endocarditis, valve unspecified: Secondary | ICD-10-CM

## 2013-09-18 NOTE — Telephone Encounter (Signed)
Called Cheryl with [employer] and left message we are returning your call- please call us tomorrow. Per protocol needs 3 hr GTT scheduled for 1 hr gtt <190.

## 2013-09-18 NOTE — Patient Instructions (Signed)
  We will see you back in follow up after the test.  Dr Gwenlyn Found has ordered : 1.  Event monitor. Event monitors are medical devices that record the heart's electrical activity. Doctors most often [place] these monitors to diagnose arrhythmias. Arrhythmias are problems with the speed or rhythm of the heartbeat. The monitor is a small, portable device. You can wear one while you do your normal daily activities. This is usually used to diagnose what is causing palpitations/syncope (passing out).  2.  Echocardiogram. Echocardiography is a painless test that uses sound waves to create images of your heart. It provides your doctor with information about the size and shape of your heart and how well your heart's chambers and valves are working. This procedure takes approximately one hour. There are no restrictions for this procedure.

## 2013-09-18 NOTE — Assessment & Plan Note (Signed)
Betty Cain had palpitations previously on a beta blocker though currently not because of being [redacted] weeks pregnant. She's had 4 prior pregnancies and has had palpitations during each one that she relates this pain more severe. She is not on a beta blocker currently. I did get an event monitor.

## 2013-09-18 NOTE — Telephone Encounter (Signed)
Called patient with [facility] interpreter [phone] at both numbers and no answer on either, unable to leave a message due to no voicemail

## 2013-09-18 NOTE — Assessment & Plan Note (Signed)
38 year old [place] woman with a history of valvular heart disease documented in [place] 4 years ago. She was placed on a low-dose beta blocker for palpitations which has been withdrawn during her pregnancy. She started [redacted] weeks pregnant and complaining of shortness of breath and palpitations. She does have a soft flow murmur. I am going to obtain a 2-D echocardiogram.

## 2013-09-18 NOTE — Progress Notes (Signed)
09/18/2013 Betty Cain   01/29/1976  [id number]  Primary Physician No PCP Per Patient Primary Cardiologist: Lorretta Harp MD Renae Gloss   HPI:  [name] is a 38 year old [redacted] week pregnant [place] woman referred by Dr. Baltazar Najjar, or her OB/GYN, for evaluation of palpitations and shortness of breath. She basically has no cardiac risk factors. She was diagnosed with a valvular heart condition in [place] for years ago and has been only on a low-dose beta blocker. This was held to her pregnancy. She's had 4 prior pregnancies with similar symptoms. She complains of shortness of breath and palpitations.   Current Outpatient Prescriptions  Medication Sig Dispense Refill  . folic acid (FOLVITE) 1 MG tablet Take 1 mg by mouth daily.      Marland Kitchen loratadine (CLARITIN) 10 MG tablet Take 1 tablet (10 mg total) by mouth daily.  30 tablet  2  . Prenatal Multivit-Min-Fe-FA (PRENATAL VITAMINS) 0.8 MG tablet Take 1 tablet by mouth daily.  30 tablet  12  . promethazine (PHENERGAN) 25 MG tablet Take 0.5-1 tablets (12.5-25 mg total) by mouth every 6 (six) hours as needed for nausea.  30 tablet  2   Current Facility-Administered Medications  Medication Dose Route Frequency Valerian Jewel Last Rate Last Dose  . ibuprofen (ADVIL,MOTRIN) tablet 600 mg  600 mg Oral Once Doren Custard, CNM        No Known Allergies  History   Social History  . Marital Status: Married    Spouse Name: N/A    Number of Children: N/A  . Years of Education: N/A   Occupational History  . Not on file.   Social History Main Topics  . Smoking status: Never Smoker   . Smokeless tobacco: Never Used  . Alcohol Use: No  . Drug Use: No  . Sexual Activity: Yes    Birth Control/ Protection: IUD, None   Other Topics Concern  . Not on file   Social History Narrative  . No narrative on file     Review of Systems: General: negative for chills, fever, night sweats or weight changes.  Cardiovascular: negative  for chest pain, dyspnea on exertion, edema, orthopnea, palpitations, paroxysmal nocturnal dyspnea or shortness of breath Dermatological: negative for rash Respiratory: negative for cough or wheezing Urologic: negative for hematuria Abdominal: negative for nausea, vomiting, diarrhea, bright red blood per rectum, melena, or hematemesis Neurologic: negative for visual changes, syncope, or dizziness All other systems reviewed and are otherwise negative except as noted above.    Blood pressure 108/66, pulse 96, height 4\' 9"  (1.448 m), weight 137 lb (62.143 kg), last menstrual period 03/12/2013, currently breastfeeding.  General appearance: alert and no distress Neck: no adenopathy, no carotid bruit, no JVD, supple, symmetrical, trachea midline and thyroid not enlarged, symmetric, no tenderness/mass/nodules Lungs: clear to auscultation bilaterally Heart: soft flow murmur Extremities: extremities normal, atraumatic, no cyanosis or edema  EKG normal sinus rhythm at 96 without ST or T wave changes  ASSESSMENT AND PLAN:   Valvular heart disease 38 year old [place] woman with a history of valvular heart disease documented in [place] 4 years ago. She was placed on a low-dose beta blocker for palpitations which has been withdrawn during her pregnancy. She started [redacted] weeks pregnant and complaining of shortness of breath and palpitations. She does have a soft flow murmur. I am going to obtain a 2-D echocardiogram.  Palpitations [name] had palpitations previously on a beta blocker though currently not because of being  [redacted] weeks pregnant. She's had 4 prior pregnancies and has had palpitations during each one that she relates this pain more severe. She is not on a beta blocker currently. I did get an event monitor.      Lorretta Harp MD FACP,FACC,FAHA, [facility] 09/18/2013 3:06 PM

## 2013-09-19 NOTE — Telephone Encounter (Signed)
Called patient with pacific interpreter [phone] and informed patient of results and need for 3 hr gtt. Patient verbalized understanding and stated she could come Friday at 8:30 for the 3 hr. Reminded patient to come fasting which means nothing to eat or drink except water after midnight. Patient verbalized understanding to all and had no further questions

## 2013-09-21 ENCOUNTER — Other Ambulatory Visit: Payer: Medicaid Other

## 2013-09-21 DIAGNOSIS — R7309 Other abnormal glucose: Secondary | ICD-10-CM

## 2013-09-22 LAB — GLUCOSE TOLERANCE, 3 HOURS
GLUCOSE 3 HOUR GTT: 142 mg/dL (ref 70–144)
GLUCOSE, 1 HOUR-GESTATIONAL: 165 mg/dL (ref 70–189)
Glucose Tolerance, 2 hour: 141 mg/dL (ref 70–164)
Glucose Tolerance, Fasting: 77 mg/dL (ref 70–104)

## 2013-09-28 ENCOUNTER — Ambulatory Visit (HOSPITAL_COMMUNITY)
Admission: RE | Admit: 2013-09-28 | Discharge: 2013-09-28 | Disposition: A | Discharge status: Home / Self Care | Payer: Medicaid Other | Source: Ambulatory Visit | Attending: Cardiovascular Disease | Admitting: Cardiovascular Disease

## 2013-09-28 DIAGNOSIS — R0602 Shortness of breath: Secondary | ICD-10-CM

## 2013-09-28 DIAGNOSIS — R011 Cardiac murmur, unspecified: Secondary | ICD-10-CM

## 2013-09-28 DIAGNOSIS — R002 Palpitations: Secondary | ICD-10-CM

## 2013-09-28 NOTE — Progress Notes (Signed)
2D Echocardiogram Complete.  09/28/2013   Bethany McMahill, RDCS 

## 2013-10-02 ENCOUNTER — Encounter: Payer: Self-pay | Admitting: *Deleted

## 2013-10-10 ENCOUNTER — Ambulatory Visit (HOSPITAL_COMMUNITY)
Admission: RE | Admit: 2013-10-10 | Discharge: 2013-10-10 | Disposition: A | Discharge status: Home / Self Care | Payer: Medicaid Other | Source: Ambulatory Visit | Attending: Obstetrics and Gynecology | Admitting: Obstetrics and Gynecology

## 2013-10-10 ENCOUNTER — Ambulatory Visit (INDEPENDENT_AMBULATORY_CARE_PROVIDER_SITE_OTHER): Payer: Medicaid Other | Admitting: Obstetrics and Gynecology

## 2013-10-10 ENCOUNTER — Encounter: Payer: Self-pay | Admitting: Obstetrics and Gynecology

## 2013-10-10 VITALS — BP 117/79 | HR 108 | Temp 97.6°F | Wt 137.0 lb

## 2013-10-10 DIAGNOSIS — O368131 Decreased fetal movements, third trimester, fetus 1: Secondary | ICD-10-CM

## 2013-10-10 DIAGNOSIS — O3090 Multiple gestation, unspecified, unspecified trimester: Secondary | ICD-10-CM

## 2013-10-10 DIAGNOSIS — Z3480 Encounter for supervision of other normal pregnancy, unspecified trimester: Secondary | ICD-10-CM

## 2013-10-10 DIAGNOSIS — O30009 Twin pregnancy, unspecified number of placenta and unspecified number of amniotic sacs, unspecified trimester: Secondary | ICD-10-CM | POA: Insufficient documentation

## 2013-10-10 DIAGNOSIS — O34219 Maternal care for unspecified type scar from previous cesarean delivery: Secondary | ICD-10-CM

## 2013-10-10 DIAGNOSIS — I38 Endocarditis, valve unspecified: Secondary | ICD-10-CM

## 2013-10-10 DIAGNOSIS — O368190 Decreased fetal movements, unspecified trimester, not applicable or unspecified: Secondary | ICD-10-CM | POA: Insufficient documentation

## 2013-10-10 LAB — POCT URINALYSIS DIP (DEVICE)
Bilirubin Urine: NEGATIVE
GLUCOSE, UA: NEGATIVE mg/dL
Ketones, ur: NEGATIVE mg/dL
Leukocytes, UA: NEGATIVE
Nitrite: NEGATIVE
Protein, ur: NEGATIVE mg/dL
Specific Gravity, Urine: 1.015 (ref 1.005–1.030)
UROBILINOGEN UA: 0.2 mg/dL (ref 0.0–1.0)
pH: 7 (ref 5.0–8.0)

## 2013-10-10 LAB — GLUCOSE, CAPILLARY: GLUCOSE-CAPILLARY: 95 mg/dL (ref 70–99)

## 2013-10-10 MED ORDER — HYDROCORTISONE ACETATE 25 MG RE SUPP: 25.0000 mg | Freq: Two times a day (BID) | RECTAL | Status: DC

## 2013-10-10 MED ORDER — FERROUS SULFATE 325 (65 FE) MG PO TABS: 325.0000 mg | ORAL_TABLET | Freq: Two times a day (BID) | ORAL | Status: DC

## 2013-10-10 NOTE — Patient Instructions (Signed)
Fetal Movement Counts Patient Name: __________________________________________________ Patient Due Date: ____________________ Performing a fetal movement count is highly recommended in high-risk pregnancies, but it is good for every pregnant woman to do. Your caregiver may ask you to start counting fetal movements at 28 weeks of the pregnancy. Fetal movements often increase:  After eating a full meal.  After physical activity.  After eating or drinking something sweet or cold.  At rest. Pay attention to when you feel the baby is most active. This will help you notice a pattern of your baby's sleep and wake cycles and what factors contribute to an increase in fetal movement. It is important to perform a fetal movement count at the same time each day when your baby is normally most active.  HOW TO COUNT FETAL MOVEMENTS 1. Find a quiet and comfortable area to sit or lie down on your left side. Lying on your left side provides the best blood and oxygen circulation to your baby. 2. Write down the day and time on a sheet of paper or in a journal. 3. Start counting kicks, flutters, swishes, rolls, or jabs in a 2 hour period. You should feel at least 10 movements within 2 hours. 4. If you do not feel 10 movements in 2 hours, wait 2-3 hours and count again. Look for a change in the pattern or not enough counts in 2 hours. SEEK MEDICAL CARE IF:  You feel less than 10 counts in 2 hours, tried twice.  There is no movement in over an hour.  The pattern is changing or taking longer each day to reach 10 counts in 2 hours.  You feel the baby is not moving as he or she usually does. Date: ____________ Movements: ____________ Start time: ____________ Elizebeth Koller time: ____________  Date: ____________ Movements: ____________ Start time: ____________ Elizebeth Koller time: ____________ Date: ____________ Movements: ____________ Start time: ____________ Elizebeth Koller time: ____________ Date: ____________ Movements: ____________  Start time: ____________ Elizebeth Koller time: ____________ Date: ____________ Movements: ____________ Start time: ____________ Elizebeth Koller time: ____________ Date: ____________ Movements: ____________ Start time: ____________ Elizebeth Koller time: ____________ Date: ____________ Movements: ____________ Start time: ____________ Elizebeth Koller time: ____________ Date: ____________ Movements: ____________ Start time: ____________ Elizebeth Koller time: ____________  Date: ____________ Movements: ____________ Start time: ____________ Elizebeth Koller time: ____________ Date: ____________ Movements: ____________ Start time: ____________ Elizebeth Koller time: ____________ Date: ____________ Movements: ____________ Start time: ____________ Elizebeth Koller time: ____________ Date: ____________ Movements: ____________ Start time: ____________ Elizebeth Koller time: ____________ Date: ____________ Movements: ____________ Start time: ____________ Elizebeth Koller time: ____________ Date: ____________ Movements: ____________ Start time: ____________ Elizebeth Koller time: ____________ Date: ____________ Movements: ____________ Start time: ____________ Elizebeth Koller time: ____________  Date: ____________ Movements: ____________ Start time: ____________ Elizebeth Koller time: ____________ Date: ____________ Movements: ____________ Start time: ____________ Elizebeth Koller time: ____________ Date: ____________ Movements: ____________ Start time: ____________ Elizebeth Koller time: ____________ Date: ____________ Movements: ____________ Start time: ____________ Elizebeth Koller time: ____________ Date: ____________ Movements: ____________ Start time: ____________ Elizebeth Koller time: ____________ Date: ____________ Movements: ____________ Start time: ____________ Elizebeth Koller time: ____________ Date: ____________ Movements: ____________ Start time: ____________ Elizebeth Koller time: ____________  Date: ____________ Movements: ____________ Start time: ____________ Elizebeth Koller time: ____________ Date: ____________ Movements: ____________ Start time: ____________ Elizebeth Koller time:  ____________ Date: ____________ Movements: ____________ Start time: ____________ Elizebeth Koller time: ____________ Date: ____________ Movements: ____________ Start time: ____________ Elizebeth Koller time: ____________ Date: ____________ Movements: ____________ Start time: ____________ Elizebeth Koller time: ____________ Date: ____________ Movements: ____________ Start time: ____________ Elizebeth Koller time: ____________ Date: ____________ Movements: ____________ Start time: ____________ Elizebeth Koller time: ____________  Date: ____________ Movements: ____________ Start time: ____________ Elizebeth Koller time:  ____________ Date: ____________ Movements: ____________ Start time: ____________ Elizebeth Koller time: ____________ Date: ____________ Movements: ____________ Start time: ____________ Elizebeth Koller time: ____________ Date: ____________ Movements: ____________ Start time: ____________ Elizebeth Koller time: ____________ Date: ____________ Movements: ____________ Start time: ____________ Elizebeth Koller time: ____________ Date: ____________ Movements: ____________ Start time: ____________ Elizebeth Koller time: ____________ Date: ____________ Movements: ____________ Start time: ____________ Elizebeth Koller time: ____________  Date: ____________ Movements: ____________ Start time: ____________ Elizebeth Koller time: ____________ Date: ____________ Movements: ____________ Start time: ____________ Elizebeth Koller time: ____________ Date: ____________ Movements: ____________ Start time: ____________ Elizebeth Koller time: ____________ Date: ____________ Movements: ____________ Start time: ____________ Elizebeth Koller time: ____________ Date: ____________ Movements: ____________ Start time: ____________ Elizebeth Koller time: ____________ Date: ____________ Movements: ____________ Start time: ____________ Elizebeth Koller time: ____________ Date: ____________ Movements: ____________ Start time: ____________ Elizebeth Koller time: ____________  Date: ____________ Movements: ____________ Start time: ____________ Elizebeth Koller time: ____________ Date: ____________ Movements:  ____________ Start time: ____________ Elizebeth Koller time: ____________ Date: ____________ Movements: ____________ Start time: ____________ Elizebeth Koller time: ____________ Date: ____________ Movements: ____________ Start time: ____________ Elizebeth Koller time: ____________ Date: ____________ Movements: ____________ Start time: ____________ Elizebeth Koller time: ____________ Date: ____________ Movements: ____________ Start time: ____________ Elizebeth Koller time: ____________ Date: ____________ Movements: ____________ Start time: ____________ Elizebeth Koller time: ____________  Date: ____________ Movements: ____________ Start time: ____________ Elizebeth Koller time: ____________ Date: ____________ Movements: ____________ Start time: ____________ Elizebeth Koller time: ____________ Date: ____________ Movements: ____________ Start time: ____________ Elizebeth Koller time: ____________ Date: ____________ Movements: ____________ Start time: ____________ Elizebeth Koller time: ____________ Date: ____________ Movements: ____________ Start time: ____________ Elizebeth Koller time: ____________ Date: ____________ Movements: ____________ Start time: ____________ Elizebeth Koller time: ____________ Document Released: 04/28/2006 Document Revised: 03/15/2012 Document Reviewed: 01/24/2012 Exit[employer] Patient Information 2015 [employer]. This information is not intended to replace advice given to you by your health care provider. Make sure you discuss any questions you have with your health care provider.

## 2013-10-10 NOTE — Progress Notes (Signed)
Decreased FM. Occasional UC. >Check BPP  Fetal echo done: nl structure, EF 60-65%. See Dr. Kennon Holter consult notes. No improvement in palpitations, SOB but not symptomatic for either at present. States she will make F/U appointment with [facility] after Holter monitor results reviewed (to turn in 10/15/13). Has fatigue and RLP sx. Hgb was 10.5 with MCV 88> add iron supplementation.  Had 1 hr 187, 3hr with 1 borderline abnormal value. 11 hr. fasting CBG now 95. S=D. Check FBS next visit

## 2013-10-10 NOTE — Progress Notes (Signed)
Reports feeling very tired lately and has headaches, also sees spots sometimes; states baby has been moving different lately sometimes less; reports abdominal/pelvic pain and lower back pain

## 2013-10-23 ENCOUNTER — Encounter: Payer: Self-pay | Admitting: *Deleted

## 2013-10-25 ENCOUNTER — Telehealth: Payer: Self-pay | Admitting: Cardiovascular Disease

## 2013-10-25 ENCOUNTER — Ambulatory Visit (INDEPENDENT_AMBULATORY_CARE_PROVIDER_SITE_OTHER): Payer: Medicaid Other | Admitting: Obstetrics & Gynecology

## 2013-10-25 ENCOUNTER — Encounter: Payer: Self-pay | Admitting: Obstetrics & Gynecology

## 2013-10-25 VITALS — BP 116/78 | HR 108 | Temp 98.3°F | Wt 137.9 lb

## 2013-10-25 DIAGNOSIS — O34219 Maternal care for unspecified type scar from previous cesarean delivery: Secondary | ICD-10-CM

## 2013-10-25 NOTE — Patient Instructions (Signed)
Return to clinic for any obstetric concerns or go to MAU for evaluation  

## 2013-10-25 NOTE — Progress Notes (Signed)
Desired embassy letter for her mother, this was provided.   Still awaiting the results of Holter monitoring, still has occasional palpitations No other complaints or concerns.  Fetal movement and labor precautions reviewed.

## 2013-10-25 NOTE — Telephone Encounter (Signed)
Wants monitor results °

## 2013-10-25 NOTE — Telephone Encounter (Signed)
Returned call to schedule appointment. Had to leave message.

## 2013-10-25 NOTE — Telephone Encounter (Signed)
Patient notified of monitor results. Informed that she will be getting a letter in mail with results as well.

## 2013-10-26 LAB — POCT URINALYSIS DIP (DEVICE)
Bilirubin Urine: NEGATIVE
GLUCOSE, UA: NEGATIVE mg/dL
Ketones, ur: NEGATIVE mg/dL
Leukocytes, UA: NEGATIVE
Nitrite: NEGATIVE
Protein, ur: NEGATIVE mg/dL
Specific Gravity, Urine: 1.02 (ref 1.005–1.030)
Urobilinogen, UA: 0.2 mg/dL (ref 0.0–1.0)
pH: 6 (ref 5.0–8.0)

## 2013-11-03 ENCOUNTER — Inpatient Hospital Stay (HOSPITAL_COMMUNITY)
Admission: AD | Admit: 2013-11-03 | Discharge: 2013-11-03 | Disposition: A | Discharge status: Home / Self Care | Payer: Medicaid Other | Source: Ambulatory Visit | Attending: Obstetrics & Gynecology | Admitting: Obstetrics & Gynecology

## 2013-11-03 ENCOUNTER — Encounter (HOSPITAL_COMMUNITY): Payer: Self-pay | Admitting: *Deleted

## 2013-11-03 DIAGNOSIS — K219 Gastro-esophageal reflux disease without esophagitis: Secondary | ICD-10-CM | POA: Insufficient documentation

## 2013-11-03 DIAGNOSIS — R42 Dizziness and giddiness: Secondary | ICD-10-CM | POA: Insufficient documentation

## 2013-11-03 DIAGNOSIS — R002 Palpitations: Secondary | ICD-10-CM | POA: Insufficient documentation

## 2013-11-03 DIAGNOSIS — O99891 Other specified diseases and conditions complicating pregnancy: Secondary | ICD-10-CM | POA: Diagnosis not present

## 2013-11-03 DIAGNOSIS — R5383 Other fatigue: Secondary | ICD-10-CM

## 2013-11-03 DIAGNOSIS — R5381 Other malaise: Secondary | ICD-10-CM | POA: Insufficient documentation

## 2013-11-03 DIAGNOSIS — R109 Unspecified abdominal pain: Secondary | ICD-10-CM | POA: Diagnosis present

## 2013-11-03 DIAGNOSIS — O9989 Other specified diseases and conditions complicating pregnancy, childbirth and the puerperium: Principal | ICD-10-CM

## 2013-11-03 DIAGNOSIS — I498 Other specified cardiac arrhythmias: Secondary | ICD-10-CM | POA: Diagnosis not present

## 2013-11-03 LAB — CBC
HCT: 30.2 % — ABNORMAL LOW (ref 36.0–46.0)
HEMOGLOBIN: 10.3 g/dL — AB (ref 12.0–15.0)
MCH: 30.8 pg (ref 26.0–34.0)
MCHC: 34.1 g/dL (ref 30.0–36.0)
MCV: 90.4 fL (ref 78.0–100.0)
Platelets: 205 10*3/uL (ref 150–400)
RBC: 3.34 MIL/uL — ABNORMAL LOW (ref 3.87–5.11)
RDW: 14.4 % (ref 11.5–15.5)
WBC: 11.2 10*3/uL — AB (ref 4.0–10.5)

## 2013-11-03 LAB — COMPREHENSIVE METABOLIC PANEL
ALT: 6 U/L (ref 0–35)
ANION GAP: 12 (ref 5–15)
AST: 13 U/L (ref 0–37)
Albumin: 2.6 g/dL — ABNORMAL LOW (ref 3.5–5.2)
Alkaline Phosphatase: 80 U/L (ref 39–117)
BILIRUBIN TOTAL: 0.2 mg/dL — AB (ref 0.3–1.2)
BUN: 7 mg/dL (ref 6–23)
CHLORIDE: 101 meq/L (ref 96–112)
CO2: 22 meq/L (ref 19–32)
CREATININE: 0.42 mg/dL — AB (ref 0.50–1.10)
Calcium: 8.7 mg/dL (ref 8.4–10.5)
GFR calc Af Amer: >90 mL/min (ref >90)
Glucose, Bld: 93 mg/dL (ref 70–99)
POTASSIUM: 3.5 meq/L — AB (ref 3.7–5.3)
Sodium: 135 mEq/L — ABNORMAL LOW (ref 137–147)
Total Protein: 6.4 g/dL (ref 6.0–8.3)

## 2013-11-03 LAB — URINALYSIS, ROUTINE W REFLEX MICROSCOPIC
BILIRUBIN URINE: NEGATIVE
GLUCOSE, UA: NEGATIVE mg/dL
Hgb urine dipstick: NEGATIVE
KETONES UR: NEGATIVE mg/dL
Leukocytes, UA: NEGATIVE
Nitrite: NEGATIVE
PROTEIN: NEGATIVE mg/dL
Specific Gravity, Urine: 1.015 (ref 1.005–1.030)
Urobilinogen, UA: 0.2 mg/dL (ref 0.0–1.0)
pH: 8.5 — ABNORMAL HIGH (ref 5.0–8.0)

## 2013-11-03 LAB — URINE MICROSCOPIC-ADD ON

## 2013-11-03 LAB — LIPASE, BLOOD: Lipase: 57 U/L (ref 11–59)

## 2013-11-03 LAB — AMYLASE: AMYLASE: 126 U/L — AB (ref 0–105)

## 2013-11-03 MED ORDER — PANTOPRAZOLE SODIUM 20 MG PO TBEC
20.0000 mg | DELAYED_RELEASE_TABLET | Freq: Once | ORAL | Status: AC
  Administered 2013-11-03: 20 mg via ORAL
  Filled 2013-11-03: qty 1

## 2013-11-03 MED ORDER — PANTOPRAZOLE SODIUM 20 MG PO TBEC: 20.0000 mg | DELAYED_RELEASE_TABLET | Freq: Every day | ORAL | Status: DC

## 2013-11-03 MED ORDER — GI COCKTAIL ~~LOC~~
30.0000 mL | Freq: Once | ORAL | Status: AC
  Administered 2013-11-03: 30 mL via ORAL
  Filled 2013-11-03: qty 30

## 2013-11-03 MED ORDER — CALCIUM CARBONATE ANTACID 500 MG PO CHEW: 1.0000 | CHEWABLE_TABLET | Freq: Two times a day (BID) | ORAL | Status: DC | PRN

## 2013-11-03 NOTE — MAU Provider Note (Signed)
History     CSN: [id number]  Arrival date and time: 11/03/13 8242   None     Chief Complaint  Patient presents with  . Fatigue  . Abdominal Pain  . Dizziness   Abdominal Pain Associated symptoms include headaches (occasional). Pertinent negatives include no constipation, diarrhea, fever, myalgias, nausea or vomiting.  Dizziness Associated symptoms include abdominal pain (epigastric), headaches (occasional) and weakness. Pertinent negatives include no chest pain, chills, fever, myalgias, nausea, neck pain or vomiting.   This is a 38 y.o. female at [redacted]w[redacted]d who presents with c/o epigastric pain which goes to back at times. Has esophageal discomfort with acid reflux. Has been feeling tired and weak with some dizziness at times. Denies chest pain or palpitations. Denies bleeding or leaking. Feels some pelvic cramping every now and then. No consistent contractions.   Primary concern is acid reflux discomfort.  Has been worked up by cardiology for palpitations. Holter showed two events of sinus tach.   RN Note: Has been feeling tired and weak since yesterday. Dizziness sometimes like is going to faint. Has hx of palpitations and had cardiac workup with Dr Gwenlyn Found with this pregnancy. Some upper abd pain       OB History   Grav Para Term Preterm Abortions TAB SAB Ect Mult Living   5 4 4       3       Past Medical History  Diagnosis Date  . Anemia   . Neonatal death     Vaginal delivery, full term-lived x2 hours.   Marland Kitchen Spinal headache   . IUD (intrauterine device) in place 09/03/2011    Due out in 07/2016  . Valvular heart disease   . Palpitations   . Shortness of breath     Past Surgical History  Procedure Laterality Date  . Cesarean section    . Adenoidectomy      as a child  . Boil  2003    right elbow  . Cesarean section  06/02/2011    Procedure: CESAREAN SECTION;  Surgeon: [name], MD;  Location: [facility];  Service: Gynecology;  Laterality: N/A;  Primary Cesarean  Section Delivery Baby Boy @ 0004, Apgars 9/9    Family History  Problem Relation Age of Onset  . Anesthesia problems Neg Hx   . Diabetes Mother   . Diabetes Father   . Diabetes Sister     History  Substance Use Topics  . Smoking status: Never Smoker   . Smokeless tobacco: Never Used  . Alcohol Use: No    Allergies: No Known Allergies  Facility-administered medications prior to admission  Medication Dose Route Frequency Provider Last Rate Last Dose  . ibuprofen (ADVIL,MOTRIN) tablet 600 mg  600 mg Oral Once Doren Custard, CNM       Prescriptions prior to admission  Medication Sig Dispense Refill  . acetaminophen (TYLENOL) 325 MG tablet Take 650 mg by mouth every 6 (six) hours as needed for headache.       . ferrous sulfate (FERROUSUL) 325 (65 FE) MG tablet Take 1 tablet (325 mg total) by mouth 2 (two) times daily.  60 tablet  1  . folic acid (FOLVITE) 1 MG tablet Take 1 mg by mouth daily.      Marland Kitchen loratadine (CLARITIN) 10 MG tablet Take 1 tablet (10 mg total) by mouth daily.  30 tablet  2  . Prenatal Multivit-Min-Fe-FA (PRENATAL VITAMINS) 0.8 MG tablet Take 1 tablet by mouth daily.  30 tablet  12  . promethazine (PHENERGAN) 25 MG tablet Take 0.5-1 tablets (12.5-25 mg total) by mouth every 6 (six) hours as needed for nausea.  30 tablet  2    Review of Systems  Constitutional: Positive for malaise/fatigue. Negative for fever and chills.  Eyes: Negative for blurred vision and double vision.  Respiratory: Negative for shortness of breath and wheezing.   Cardiovascular: Negative for chest pain and palpitations.  Gastrointestinal: Positive for abdominal pain (epigastric). Negative for nausea, vomiting, diarrhea and constipation.  Musculoskeletal: Negative for back pain, myalgias and neck pain.  Neurological: Positive for dizziness (at times), weakness and headaches (occasional). Negative for focal weakness.   Physical Exam   Blood pressure 122/74, pulse 101, temperature 98.1 F  (36.7 C), resp. rate 18, last menstrual period 03/12/2013, SpO2 100.00%, currently breastfeeding.  Physical Exam  Constitutional: She is oriented to person, place, and time. She appears well-developed and well-nourished. No distress.  HENT:  Head: Normocephalic.  Cardiovascular: Normal rate and regular rhythm.  Exam reveals no gallop and no friction rub.   Murmur (very soft systolic) heard. Respiratory: Effort normal and breath sounds normal. No respiratory distress. She has no wheezes. She has no rales. She exhibits no tenderness.  GI: Soft. She exhibits no distension and no mass. There is tenderness (mild, epigastric). There is no rebound and no guarding.  Musculoskeletal: Normal range of motion. She exhibits no edema and no tenderness.  Neurological: She is alert and oriented to person, place, and time.  Skin: Skin is warm and dry.  Psychiatric: She has a normal mood and affect.   FHR tracing reassuring Irregular mild contractions  MAU Course  Procedures  MDM Results for orders placed during the hospital encounter of 11/03/13 (from the past 24 hour(s))  URINALYSIS, ROUTINE W REFLEX MICROSCOPIC     Status: Abnormal   Collection Time    11/03/13  7:53 PM      Result Value Ref Range   Color, Urine YELLOW  YELLOW   APPearance TURBID (*) CLEAR   Specific Gravity, Urine 1.015  1.005 - 1.030   pH 8.5 (*) 5.0 - 8.0   Glucose, UA NEGATIVE  NEGATIVE mg/dL   Hgb urine dipstick NEGATIVE  NEGATIVE   Bilirubin Urine NEGATIVE  NEGATIVE   Ketones, ur NEGATIVE  NEGATIVE mg/dL   Protein, ur NEGATIVE  NEGATIVE mg/dL   Urobilinogen, UA 0.2  0.0 - 1.0 mg/dL   Nitrite NEGATIVE  NEGATIVE   Leukocytes, UA NEGATIVE  NEGATIVE  URINE MICROSCOPIC-ADD ON     Status: Abnormal   Collection Time    11/03/13  7:53 PM      Result Value Ref Range   Squamous Epithelial / LPF FEW (*) RARE   WBC, UA 0-2  <3 WBC/hpf   Bacteria, UA FEW (*) RARE  CBC     Status: Abnormal   Collection Time    11/03/13   8:25 PM      Result Value Ref Range   WBC 11.2 (*) 4.0 - 10.5 K/uL   RBC 3.34 (*) 3.87 - 5.11 MIL/uL   Hemoglobin 10.3 (*) 12.0 - 15.0 g/dL   HCT 30.2 (*) 36.0 - 46.0 %   MCV 90.4  78.0 - 100.0 fL   MCH 30.8  26.0 - 34.0 pg   MCHC 34.1  30.0 - 36.0 g/dL   RDW 14.4  11.5 - 15.5 %   Platelets 205  150 - 400 K/uL  COMPREHENSIVE METABOLIC PANEL     Status:  Abnormal   Collection Time    11/03/13  8:25 PM      Result Value Ref Range   Sodium 135 (*) 137 - 147 mEq/L   Potassium 3.5 (*) 3.7 - 5.3 mEq/L   Chloride 101  96 - 112 mEq/L   CO2 22  19 - 32 mEq/L   Glucose, Bld 93  70 - 99 mg/dL   BUN 7  6 - 23 mg/dL   Creatinine, Ser 0.42 (*) 0.50 - 1.10 mg/dL   Calcium 8.7  8.4 - 10.5 mg/dL   Total Protein 6.4  6.0 - 8.3 g/dL   Albumin 2.6 (*) 3.5 - 5.2 g/dL   AST 13  0 - 37 U/L   ALT 6  0 - 35 U/L   Alkaline Phosphatase 80  39 - 117 U/L   Total Bilirubin 0.2 (*) 0.3 - 1.2 mg/dL   GFR calc non Af Amer >90  >90 mL/min   GFR calc Af Amer >90  >90 mL/min   Anion gap 12  5 - 15  AMYLASE     Status: Abnormal   Collection Time    11/03/13  8:25 PM      Result Value Ref Range   Amylase 126 (*) 0 - 105 U/L  LIPASE, BLOOD     Status: None   Collection Time    11/03/13  8:25 PM      Result Value Ref Range   Lipase 57  11 - 59 U/L   GI cocktail given with excellent relief of discomfort First dose of Protonix given  Assessment and Plan  A:  SIUP at [redacted]w[redacted]d        GERD       Malaise       Intermittent dizziness       Intermittent sinus tachycardia  P:  Discussed with Dr Harolyn Rutherford       Rx Protonix qhs       Tums PRN        Reviewed diet and hydration, as well as good sleep habits       Has appt in clinic on Wednesday       [facility] 11/03/2013, 10:31 PM

## 2013-11-03 NOTE — MAU Note (Signed)
Has been feeling tired and weak since yesterday. Dizziness sometimes like is going to faint. Has hx of palpitations and had cardiac workup with Dr Gwenlyn Found with this pregnancy. Some upper abd pain

## 2013-11-03 NOTE — Progress Notes (Signed)
Marie Williams CNM in earlier to discuss test results and d/c plan. Written and verbal d/c instructions given and understanding voiced °

## 2013-11-03 NOTE — Discharge Instructions (Signed)
Heartburn During Pregnancy  °Heartburn is a burning sensation in the chest caused by stomach acid backing up into the esophagus. Heartburn is common in pregnancy because a certain hormone (progesterone) is released when a woman is pregnant. The progesterone hormone may relax the valve that separates the esophagus from the stomach. This allows acid to go up into the esophagus, causing heartburn. Heartburn may also happen in pregnancy because the enlarging uterus pushes up on the stomach, which pushes more acid into the esophagus. This is especially true in the later stages of pregnancy. Heartburn problems usually go away after giving birth. °CAUSES  °Heartburn is caused by stomach acid backing up into the esophagus. During pregnancy, this may result from various things, including:  °· The progesterone hormone. °· Changing hormone levels. °· The growing uterus pushing stomach acid upward. °· Large meals. °· Certain foods and drinks. °· Exercise. °· Increased acid production. °SIGNS AND SYMPTOMS  °· Burning pain in the chest or lower throat. °· Bitter taste in the mouth. °· Coughing. °DIAGNOSIS  °Your health care provider will typically diagnose heartburn by taking a careful history of your concern. Blood tests may be done to check for a certain type of bacteria that is associated with heartburn. Sometimes, heartburn is diagnosed by prescribing a heartburn medicine to see if the symptoms improve. In some cases, a procedure called an endoscopy may be done. In this procedure, a tube with a light and a camera on the end (endoscope) is used to examine the esophagus and the stomach. °TREATMENT  °Treatment will vary depending on the severity of your symptoms. Your health care provider may recommend: °· Over-the-counter medicines (antacids, acid reducers) for mild heartburn. °· Prescription medicines to decrease stomach acid or to protect your stomach lining. °· Certain changes in your diet. °· Elevating the head of your bed  by putting blocks under the legs. This helps prevent stomach acid from backing up into the esophagus when you are lying down. °HOME CARE INSTRUCTIONS  °· Only take over-the-counter or prescription medicines as directed by your health care provider. °· Raise the head of your bed by putting blocks under the legs if instructed to do so by your health care provider. Sleeping with more pillows is not effective because it only changes the position of your head. °· Do not exercise right after eating. °· Avoid eating 2-3 hours before bed. Do not lie down right after eating. °· Eat small meals throughout the day instead of three large meals. °· Identify foods and beverages that make your symptoms worse and avoid them. Foods you may want to avoid include: °¨ Peppers. °¨ Chocolate. °¨ High-fat foods, including fried foods. °¨ Spicy foods. °¨ Garlic and onions. °¨ Citrus fruits, including oranges, grapefruit, lemons, and limes. °¨ Food containing tomatoes or tomato products. °¨ Mint. °¨ Carbonated and caffeinated drinks. °¨ Vinegar. °SEEK MEDICAL CARE IF: °· You have abdominal pain of any kind. °· You feel burning in your upper abdomen or chest, especially after eating or lying down. °· You have nausea and vomiting. °· Your stomach feels upset after you eat. °SEEK IMMEDIATE MEDICAL CARE IF:  °· You have severe chest pain that goes down your arm or into your jaw or neck. °· You feel sweaty, dizzy, or light-headed. °· You become short of breath. °· You vomit blood. °· You have difficulty or pain with swallowing. °· You have bloody or black, tarry stools. °· You have episodes of heartburn more than 3 times a   week, for more than 2 weeks. °MAKE SURE YOU: °· Understand these instructions. °· Will watch your condition. °· Will get help right away if you are not doing well or get worse. °Document Released: 03/26/2000 Document Revised: 04/03/2013 Document Reviewed: 11/15/2012 °ExitCare® Patient Information ©2015 [employer]. This  information is not intended to replace advice given to you by your health care provider. Make sure you discuss any questions you have with your health care provider. ° °

## 2013-11-03 NOTE — Progress Notes (Signed)
Carmelia Roller CNM notified of pt's admission and status. CNM aware of cardiac hx. Made aware of uterine activity, v/s wnl, c/o epigastric pain. Orders received.

## 2013-11-04 NOTE — MAU Provider Note (Signed)
Attestation of Attending Supervision of Advanced Practitioner (PA/CNM/NP): Evaluation and management procedures were performed by the Advanced Practitioner under my supervision and collaboration.  I have reviewed the Advanced Practitioner's note and chart, and I agree with the management and plan.  Maelani Yarbro, MD, FACOG Attending Obstetrician & Gynecologist Faculty Practice, [facility]   

## 2013-11-07 ENCOUNTER — Ambulatory Visit (INDEPENDENT_AMBULATORY_CARE_PROVIDER_SITE_OTHER): Payer: Medicaid Other | Admitting: Obstetrics and Gynecology

## 2013-11-07 ENCOUNTER — Encounter: Payer: Self-pay | Admitting: Obstetrics and Gynecology

## 2013-11-07 VITALS — BP 118/73 | HR 88 | Temp 97.0°F | Wt 138.0 lb

## 2013-11-07 DIAGNOSIS — O34219 Maternal care for unspecified type scar from previous cesarean delivery: Secondary | ICD-10-CM

## 2013-11-07 LAB — POCT URINALYSIS DIP (DEVICE)
Bilirubin Urine: NEGATIVE
Glucose, UA: NEGATIVE mg/dL
KETONES UR: NEGATIVE mg/dL
Leukocytes, UA: NEGATIVE
NITRITE: NEGATIVE
PH: 6 (ref 5.0–8.0)
Protein, ur: NEGATIVE mg/dL
Specific Gravity, Urine: 1.015 (ref 1.005–1.030)
Urobilinogen, UA: 0.2 mg/dL (ref 0.0–1.0)

## 2013-11-07 MED ORDER — PANTOPRAZOLE SODIUM 20 MG PO TBEC: 20.0000 mg | DELAYED_RELEASE_TABLET | Freq: Every day | ORAL | Status: DC

## 2013-11-07 NOTE — Progress Notes (Signed)
Lots of late gestational discomforts> discussed and reassured. Refilled Protonix. Good FM. Mother will be coming.

## 2013-11-07 NOTE — Patient Instructions (Signed)
Heartburn  Heartburn is a painful, burning sensation in the chest. It may feel worse in certain positions, such as lying down or bending over. It is caused by stomach acid backing up into the tube that carries food from the mouth down to the stomach (lower esophagus).   CAUSES   · Large meals.  · Certain foods and drinks.  · Exercise.  · Increased acid production.  · Being overweight or obese.  · Certain medicines.  SYMPTOMS   · Burning pain in the chest or lower throat.  · Bitter taste in the mouth.  · Coughing.  DIAGNOSIS   If the usual treatments for heartburn do not improve your symptoms, then tests may be done to see if there is another condition present. Possible tests may include:  · X-rays.  · Endoscopy. This is when a tube with a light and a camera on the end is used to examine the esophagus and the stomach.  · A test to measure the amount of acid in the esophagus (pH test).  · A test to see if the esophagus is working properly (esophageal manometry).  · Blood, breath, or stool tests to check for bacteria that cause ulcers.  TREATMENT   · Your caregiver may tell you to use certain over-the-counter medicines (antacids, acid reducers) for mild heartburn.  · Your caregiver may prescribe medicines to decrease the acid in your stomach or protect your stomach lining.  · Your caregiver may recommend certain diet changes.  · For severe cases, your caregiver may recommend that the head of your bed be elevated on blocks. (Sleeping with more pillows is not an effective treatment as it only changes the position of your head and does not improve the main problem of stomach acid refluxing into the esophagus.)  HOME CARE INSTRUCTIONS   · Take all medicines as directed by your caregiver.  · Raise the head of your bed by putting blocks under the legs if instructed to by your caregiver.  · Do not exercise right after eating.  · Avoid eating 2 or 3 hours before bed. Do not lie down right after eating.  · Eat small meals  throughout the day instead of 3 large meals.  · Stop smoking if you smoke.  · Maintain a healthy weight.  · Identify foods and beverages that make your symptoms worse and avoid them. Foods you may want to avoid include:  ¨ Peppers.  ¨ Chocolate.  ¨ High-fat foods, including fried foods.  ¨ Spicy foods.  ¨ Garlic and onions.  ¨ Citrus fruits, including oranges, grapefruit, lemons, and limes.  ¨ Food containing tomatoes or tomato products.  ¨ Mint.  ¨ Carbonated drinks, caffeinated drinks, and alcohol.  ¨ Vinegar.  SEEK IMMEDIATE MEDICAL CARE IF:  · You have severe chest pain that goes down your arm or into your jaw or neck.  · You feel sweaty, dizzy, or lightheaded.  · You are short of breath.  · You vomit blood.  · You have difficulty or pain with swallowing.  · You have bloody or black, tarry stools.  · You have episodes of heartburn more than 3 times a week for more than 2 weeks.  MAKE SURE YOU:  · Understand these instructions.  · Will watch your condition.  · Will get help right away if you are not doing well or get worse.  Document Released: 08/15/2008 Document Revised: 06/21/2011 Document Reviewed: 09/13/2010  ExitCare® Patient Information ©2015 [employer]. This information is   not intended to replace advice given to you by your health care provider. Make sure you discuss any questions you have with your health care provider.

## 2013-11-22 ENCOUNTER — Ambulatory Visit (INDEPENDENT_AMBULATORY_CARE_PROVIDER_SITE_OTHER): Payer: Medicaid Other | Admitting: Obstetrics and Gynecology

## 2013-11-22 ENCOUNTER — Encounter: Payer: Self-pay | Admitting: Obstetrics and Gynecology

## 2013-11-22 ENCOUNTER — Other Ambulatory Visit: Payer: Self-pay | Admitting: Obstetrics and Gynecology

## 2013-11-22 VITALS — BP 119/78 | HR 124 | Temp 98.5°F | Wt 138.4 lb

## 2013-11-22 DIAGNOSIS — O09529 Supervision of elderly multigravida, unspecified trimester: Secondary | ICD-10-CM

## 2013-11-22 DIAGNOSIS — I38 Endocarditis, valve unspecified: Secondary | ICD-10-CM

## 2013-11-22 DIAGNOSIS — IMO0002 Reserved for concepts with insufficient information to code with codable children: Secondary | ICD-10-CM

## 2013-11-22 DIAGNOSIS — O34219 Maternal care for unspecified type scar from previous cesarean delivery: Secondary | ICD-10-CM

## 2013-11-22 LAB — POCT URINALYSIS DIP (DEVICE)
Glucose, UA: NEGATIVE mg/dL
Leukocytes, UA: NEGATIVE
Nitrite: NEGATIVE
PH: 5.5 (ref 5.0–8.0)
Protein, ur: 30 mg/dL — AB
Specific Gravity, Urine: 1.02 (ref 1.005–1.030)
Urobilinogen, UA: 0.2 mg/dL (ref 0.0–1.0)

## 2013-11-22 LAB — OB RESULTS CONSOLE GBS: STREP GROUP B AG: NEGATIVE

## 2013-11-22 LAB — OB RESULTS CONSOLE GC/CHLAMYDIA
Chlamydia: NEGATIVE
Gonorrhea: NEGATIVE

## 2013-11-22 NOTE — Progress Notes (Signed)
Reports decreased FM; reports pelvic,groin and side pain

## 2013-11-22 NOTE — Progress Notes (Signed)
Patient is doing well without complaints. FM/labor precautions reviewed. Cultures collected

## 2013-11-23 LAB — GC/CHLAMYDIA PROBE AMP
CT Probe RNA: NEGATIVE
GC Probe RNA: NEGATIVE

## 2013-11-24 LAB — CULTURE, BETA STREP (GROUP B ONLY)

## 2013-11-29 ENCOUNTER — Encounter: Payer: Self-pay | Admitting: Obstetrics and Gynecology

## 2013-11-29 ENCOUNTER — Ambulatory Visit (INDEPENDENT_AMBULATORY_CARE_PROVIDER_SITE_OTHER): Payer: Medicaid Other | Admitting: Obstetrics & Gynecology

## 2013-11-29 VITALS — BP 122/83 | HR 105 | Temp 98.6°F | Wt 140.8 lb

## 2013-11-29 DIAGNOSIS — O34219 Maternal care for unspecified type scar from previous cesarean delivery: Secondary | ICD-10-CM

## 2013-11-29 LAB — POCT URINALYSIS DIP (DEVICE)
BILIRUBIN URINE: NEGATIVE
GLUCOSE, UA: NEGATIVE mg/dL
Ketones, ur: NEGATIVE mg/dL
LEUKOCYTES UA: NEGATIVE
NITRITE: NEGATIVE
Protein, ur: NEGATIVE mg/dL
Specific Gravity, Urine: 1.01 (ref 1.005–1.030)
Urobilinogen, UA: 0.2 mg/dL (ref 0.0–1.0)
pH: 6.5 (ref 5.0–8.0)

## 2013-11-29 NOTE — Patient Instructions (Signed)
Return to clinic for any obstetric concerns or go to MAU for evaluation  

## 2013-11-29 NOTE — Progress Notes (Signed)
Pt has had diarrhea for the past 4 days, pt is concerned about where she is feeling fetal movement.  Pt is worried about right leg numbness. Pt states fetal movement is less than less week.  Pt complains of watery discharge.

## 2013-11-29 NOTE — Progress Notes (Signed)
Interpreter present.  Patient's complaints addressed; advised to take a lot of fluids. Diarrhea likely due to viral gastroenteritis. No hematochezia. Right leg numbness can occur in pregnancy; patient reassured Increased watery discharge; she does not feel water broke, just increased mucus discharge.  Reassured this was normal in pregnancy. GBS neg. Cervix closed/thick/ballotable No other complaints or concerns.  Labor and fetal movement precautions reviewed.

## 2013-12-07 ENCOUNTER — Encounter (HOSPITAL_COMMUNITY): Payer: Self-pay

## 2013-12-07 ENCOUNTER — Encounter (HOSPITAL_COMMUNITY)
Admission: RE | Admit: 2013-12-07 | Discharge: 2013-12-07 | Disposition: A | Discharge status: Home / Self Care | Payer: Medicaid Other | Source: Ambulatory Visit | Attending: Obstetrics and Gynecology | Admitting: Obstetrics and Gynecology

## 2013-12-07 LAB — CBC
HEMATOCRIT: 33.7 % — AB (ref 36.0–46.0)
Hemoglobin: 11.4 g/dL — ABNORMAL LOW (ref 12.0–15.0)
MCH: 30.7 pg (ref 26.0–34.0)
MCHC: 33.8 g/dL (ref 30.0–36.0)
MCV: 90.8 fL (ref 78.0–100.0)
Platelets: 201 10*3/uL (ref 150–400)
RBC: 3.71 MIL/uL — AB (ref 3.87–5.11)
RDW: 14.7 % (ref 11.5–15.5)
WBC: 8.2 10*3/uL (ref 4.0–10.5)

## 2013-12-07 LAB — RPR

## 2013-12-07 NOTE — Pre-Procedure Instructions (Signed)
FATMIA (ARABIC) INTERPRETOR PRESENT FOR PRE-OP VISIT

## 2013-12-07 NOTE — Patient Instructions (Signed)
   Your procedure is scheduled on: AUG 31ST AT 915AM  Enter through the Main Entrance of [facility] at [place] up the phone at the desk and dial [phone] and inform us of your arrival.  Please call this number if you have any problems the morning of surgery: [phone]  Remember: Do not eat food after midnight:AUG 30 Do not drink clear liquids after:AUG 30 Take these medicines the morning of surgery with a SIP OF WATER: TAKE PROTONIX DAY OR SURGERY OR NIGHT BEFORE   Do not wear jewelry, make-up, or FINGER nail polish No metal in your hair or on your body. Do not wear lotions, powders, perfumes.  You may wear deodorant.  Do not bring valuables to the hospital. Contacts, dentures or bridgework may not be worn into surgery.  Leave suitcase in the car. After Surgery it may be brought to your room. For patients being admitted to the hospital, checkout time is 11:00am the day of discharge.    Patients discharged on the day of surgery will not be allowed to drive home.

## 2013-12-10 ENCOUNTER — Encounter (HOSPITAL_COMMUNITY): Payer: Self-pay | Admitting: Anesthesiology

## 2013-12-10 ENCOUNTER — Inpatient Hospital Stay (HOSPITAL_COMMUNITY)
Admission: RE | Admit: 2013-12-10 | Discharge: 2013-12-13 | DRG: 766 | Disposition: A | Discharge status: Home / Self Care | Payer: Medicaid Other | Source: Ambulatory Visit | Attending: Family Medicine | Admitting: Family Medicine

## 2013-12-10 ENCOUNTER — Encounter (HOSPITAL_COMMUNITY)
Admission: RE | Disposition: A | Discharge status: Home / Self Care | Payer: Self-pay | Source: Ambulatory Visit | Attending: Family Medicine

## 2013-12-10 ENCOUNTER — Encounter (HOSPITAL_COMMUNITY): Payer: Medicaid Other | Admitting: Anesthesiology

## 2013-12-10 ENCOUNTER — Inpatient Hospital Stay (HOSPITAL_COMMUNITY): Payer: Medicaid Other | Admitting: Anesthesiology

## 2013-12-10 DIAGNOSIS — O321XX0 Maternal care for breech presentation, not applicable or unspecified: Secondary | ICD-10-CM | POA: Diagnosis present

## 2013-12-10 DIAGNOSIS — O34219 Maternal care for unspecified type scar from previous cesarean delivery: Principal | ICD-10-CM | POA: Diagnosis present

## 2013-12-10 DIAGNOSIS — O09529 Supervision of elderly multigravida, unspecified trimester: Secondary | ICD-10-CM | POA: Diagnosis present

## 2013-12-10 DIAGNOSIS — O0930 Supervision of pregnancy with insufficient antenatal care, unspecified trimester: Secondary | ICD-10-CM | POA: Diagnosis not present

## 2013-12-10 DIAGNOSIS — O9902 Anemia complicating childbirth: Secondary | ICD-10-CM | POA: Diagnosis present

## 2013-12-10 DIAGNOSIS — D649 Anemia, unspecified: Secondary | ICD-10-CM | POA: Diagnosis present

## 2013-12-10 DIAGNOSIS — IMO0002 Reserved for concepts with insufficient information to code with codable children: Secondary | ICD-10-CM

## 2013-12-10 LAB — PREPARE RBC (CROSSMATCH)

## 2013-12-10 SURGERY — Surgical Case
Anesthesia: Spinal | Site: Abdomen

## 2013-12-10 MED ORDER — SCOPOLAMINE 1 MG/3DAYS TD PT72
1.0000 | MEDICATED_PATCH | Freq: Once | TRANSDERMAL | Status: DC
  Administered 2013-12-10: 1.5 mg via TRANSDERMAL

## 2013-12-10 MED ORDER — IBUPROFEN 600 MG PO TABS: 600.0000 mg | ORAL_TABLET | Freq: Four times a day (QID) | ORAL | Status: DC | PRN

## 2013-12-10 MED ORDER — OXYTOCIN 40 UNITS IN LACTATED RINGERS INFUSION - SIMPLE MED: 62.5000 mL/h | INTRAVENOUS | Status: AC

## 2013-12-10 MED ORDER — OXYCODONE-ACETAMINOPHEN 5-325 MG PO TABS
1.0000 | ORAL_TABLET | ORAL | Status: DC | PRN
  Administered 2013-12-10: 2 via ORAL
  Administered 2013-12-11: 1 via ORAL
  Administered 2013-12-11 (×2): 2 via ORAL
  Administered 2013-12-11 – 2013-12-12 (×3): 1 via ORAL
  Administered 2013-12-12 – 2013-12-13 (×5): 2 via ORAL
  Filled 2013-12-10 (×2): qty 2
  Filled 2013-12-10: qty 1
  Filled 2013-12-10 (×2): qty 2
  Filled 2013-12-10: qty 1
  Filled 2013-12-10: qty 2
  Filled 2013-12-10: qty 1
  Filled 2013-12-10 (×2): qty 2
  Filled 2013-12-10: qty 1
  Filled 2013-12-10: qty 2
  Filled 2013-12-10: qty 1

## 2013-12-10 MED ORDER — ONDANSETRON HCL 4 MG/2ML IJ SOLN
INTRAMUSCULAR | Status: AC
  Filled 2013-12-10: qty 2

## 2013-12-10 MED ORDER — DIPHENHYDRAMINE HCL 50 MG/ML IJ SOLN
INTRAMUSCULAR | Status: DC | PRN
  Administered 2013-12-10: 12.5 mg via INTRAVENOUS

## 2013-12-10 MED ORDER — OXYTOCIN 10 UNIT/ML IJ SOLN
INTRAMUSCULAR | Status: AC
  Filled 2013-12-10: qty 4

## 2013-12-10 MED ORDER — WITCH HAZEL-GLYCERIN EX PADS: 1.0000 "application " | MEDICATED_PAD | CUTANEOUS | Status: DC | PRN

## 2013-12-10 MED ORDER — KETOROLAC TROMETHAMINE 30 MG/ML IJ SOLN: 30.0000 mg | Freq: Four times a day (QID) | INTRAMUSCULAR | Status: DC | PRN

## 2013-12-10 MED ORDER — ZOLPIDEM TARTRATE 5 MG PO TABS: 5.0000 mg | ORAL_TABLET | Freq: Every evening | ORAL | Status: DC | PRN

## 2013-12-10 MED ORDER — NALBUPHINE HCL 10 MG/ML IJ SOLN
INTRAMUSCULAR | Status: AC
  Filled 2013-12-10: qty 1

## 2013-12-10 MED ORDER — SCOPOLAMINE 1 MG/3DAYS TD PT72: 1.0000 | MEDICATED_PATCH | Freq: Once | TRANSDERMAL | Status: DC

## 2013-12-10 MED ORDER — TETANUS-DIPHTH-ACELL PERTUSSIS 5-2.5-18.5 LF-MCG/0.5 IM SUSP
0.5000 mL | Freq: Once | INTRAMUSCULAR | Status: AC
  Administered 2013-12-11: 0.5 mL via INTRAMUSCULAR

## 2013-12-10 MED ORDER — DIPHENHYDRAMINE HCL 50 MG/ML IJ SOLN
INTRAMUSCULAR | Status: AC
  Filled 2013-12-10: qty 1

## 2013-12-10 MED ORDER — OXYTOCIN 10 UNIT/ML IJ SOLN
40.0000 [IU] | INTRAMUSCULAR | Status: DC | PRN
  Administered 2013-12-10: 40 [IU] via INTRAVENOUS

## 2013-12-10 MED ORDER — MIDAZOLAM HCL 2 MG/2ML IJ SOLN: 0.5000 mg | Freq: Once | INTRAMUSCULAR | Status: DC | PRN

## 2013-12-10 MED ORDER — BUPIVACAINE IN DEXTROSE 0.75-8.25 % IT SOLN
INTRATHECAL | Status: DC | PRN
Start: 2013-12-10 — End: 2013-12-10
  Administered 2013-12-10: 1.4 mL via INTRATHECAL

## 2013-12-10 MED ORDER — PHENYLEPHRINE 8 MG IN D5W 100 ML (0.08MG/ML) PREMIX OPTIME
INJECTION | INTRAVENOUS | Status: DC | PRN
  Administered 2013-12-10: 60 ug/min via INTRAVENOUS

## 2013-12-10 MED ORDER — SODIUM CHLORIDE 0.9 % IJ SOLN: 3.0000 mL | INTRAMUSCULAR | Status: DC | PRN

## 2013-12-10 MED ORDER — MEPERIDINE HCL 25 MG/ML IJ SOLN: 6.2500 mg | INTRAMUSCULAR | Status: DC | PRN

## 2013-12-10 MED ORDER — SIMETHICONE 80 MG PO CHEW
80.0000 mg | CHEWABLE_TABLET | ORAL | Status: DC
  Administered 2013-12-10 – 2013-12-11 (×2): 80 mg via ORAL
  Filled 2013-12-10 (×2): qty 1

## 2013-12-10 MED ORDER — NALOXONE HCL 0.4 MG/ML IJ SOLN: 0.4000 mg | INTRAMUSCULAR | Status: DC | PRN

## 2013-12-10 MED ORDER — DIPHENHYDRAMINE HCL 50 MG/ML IJ SOLN: 25.0000 mg | INTRAMUSCULAR | Status: DC | PRN

## 2013-12-10 MED ORDER — PROMETHAZINE HCL 25 MG/ML IJ SOLN: 6.2500 mg | INTRAMUSCULAR | Status: DC | PRN

## 2013-12-10 MED ORDER — FENTANYL CITRATE 0.05 MG/ML IJ SOLN
INTRAMUSCULAR | Status: AC
  Filled 2013-12-10: qty 2

## 2013-12-10 MED ORDER — METOCLOPRAMIDE HCL 5 MG/ML IJ SOLN: 10.0000 mg | Freq: Three times a day (TID) | INTRAMUSCULAR | Status: DC | PRN

## 2013-12-10 MED ORDER — NALBUPHINE HCL 10 MG/ML IJ SOLN: 5.0000 mg | INTRAMUSCULAR | Status: DC | PRN

## 2013-12-10 MED ORDER — LACTATED RINGERS IV SOLN
INTRAVENOUS | Status: DC
  Administered 2013-12-10: 19:00:00 via INTRAVENOUS
  Administered 2013-12-11: 1 mL via INTRAVENOUS

## 2013-12-10 MED ORDER — NALOXONE HCL 1 MG/ML IJ SOLN
1.0000 ug/kg/h | INTRAVENOUS | Status: DC | PRN
  Filled 2013-12-10: qty 2

## 2013-12-10 MED ORDER — CEFAZOLIN SODIUM-DEXTROSE 2-3 GM-% IV SOLR
INTRAVENOUS | Status: AC
Start: 2013-12-10 — End: 2013-12-10
  Filled 2013-12-10: qty 50

## 2013-12-10 MED ORDER — PRENATAL MULTIVITAMIN CH
1.0000 | ORAL_TABLET | Freq: Every day | ORAL | Status: DC
  Administered 2013-12-10 – 2013-12-13 (×4): 1 via ORAL
  Filled 2013-12-10 (×4): qty 1

## 2013-12-10 MED ORDER — ONDANSETRON HCL 4 MG/2ML IJ SOLN: 4.0000 mg | INTRAMUSCULAR | Status: DC | PRN

## 2013-12-10 MED ORDER — LACTATED RINGERS IV SOLN
Freq: Once | INTRAVENOUS | Status: AC
  Administered 2013-12-10: 09:00:00 via INTRAVENOUS

## 2013-12-10 MED ORDER — SIMETHICONE 80 MG PO CHEW
80.0000 mg | CHEWABLE_TABLET | Freq: Three times a day (TID) | ORAL | Status: DC
  Administered 2013-12-10 – 2013-12-13 (×9): 80 mg via ORAL
  Filled 2013-12-10 (×8): qty 1

## 2013-12-10 MED ORDER — FENTANYL CITRATE 0.05 MG/ML IJ SOLN
INTRAMUSCULAR | Status: DC | PRN
  Administered 2013-12-10: 25 ug via INTRATHECAL

## 2013-12-10 MED ORDER — DIPHENHYDRAMINE HCL 25 MG PO CAPS
25.0000 mg | ORAL_CAPSULE | ORAL | Status: DC | PRN
Start: 2013-12-10 — End: 2013-12-10

## 2013-12-10 MED ORDER — PHENYLEPHRINE HCL 10 MG/ML IJ SOLN
INTRAMUSCULAR | Status: DC | PRN
  Administered 2013-12-10: 80 ug via INTRAVENOUS

## 2013-12-10 MED ORDER — CEFAZOLIN SODIUM-DEXTROSE 2-3 GM-% IV SOLR
2.0000 g | INTRAVENOUS | Status: AC
  Administered 2013-12-10: 2 g via INTRAVENOUS

## 2013-12-10 MED ORDER — FENTANYL CITRATE 0.05 MG/ML IJ SOLN
25.0000 ug | INTRAMUSCULAR | Status: DC | PRN
  Administered 2013-12-10: 50 ug via INTRAVENOUS

## 2013-12-10 MED ORDER — MORPHINE SULFATE (PF) 0.5 MG/ML IJ SOLN
INTRAMUSCULAR | Status: DC | PRN
  Administered 2013-12-10: .15 mg via INTRATHECAL

## 2013-12-10 MED ORDER — ACETAMINOPHEN 500 MG PO TABS: 1000.0000 mg | ORAL_TABLET | Freq: Four times a day (QID) | ORAL | Status: DC

## 2013-12-10 MED ORDER — LANOLIN HYDROUS EX OINT: 1.0000 "application " | TOPICAL_OINTMENT | CUTANEOUS | Status: DC | PRN

## 2013-12-10 MED ORDER — SIMETHICONE 80 MG PO CHEW
80.0000 mg | CHEWABLE_TABLET | ORAL | Status: DC | PRN
  Administered 2013-12-12: 80 mg via ORAL
  Filled 2013-12-10 (×2): qty 1

## 2013-12-10 MED ORDER — MORPHINE SULFATE 0.5 MG/ML IJ SOLN
INTRAMUSCULAR | Status: AC
  Filled 2013-12-10: qty 10

## 2013-12-10 MED ORDER — DIPHENHYDRAMINE HCL 50 MG/ML IJ SOLN: 12.5000 mg | INTRAMUSCULAR | Status: DC | PRN

## 2013-12-10 MED ORDER — NALBUPHINE HCL 10 MG/ML IJ SOLN
5.0000 mg | INTRAMUSCULAR | Status: DC | PRN
  Administered 2013-12-10: 10 mg via SUBCUTANEOUS

## 2013-12-10 MED ORDER — PHENYLEPHRINE 40 MCG/ML (10ML) SYRINGE FOR IV PUSH (FOR BLOOD PRESSURE SUPPORT)
PREFILLED_SYRINGE | INTRAVENOUS | Status: AC
  Filled 2013-12-10: qty 5

## 2013-12-10 MED ORDER — ONDANSETRON HCL 4 MG/2ML IJ SOLN: 4.0000 mg | Freq: Three times a day (TID) | INTRAMUSCULAR | Status: DC | PRN

## 2013-12-10 MED ORDER — LACTATED RINGERS IV SOLN
INTRAVENOUS | Status: DC | PRN
  Administered 2013-12-10 (×3): via INTRAVENOUS

## 2013-12-10 MED ORDER — DIPHENHYDRAMINE HCL 25 MG PO CAPS
25.0000 mg | ORAL_CAPSULE | Freq: Four times a day (QID) | ORAL | Status: DC | PRN
  Administered 2013-12-10 – 2013-12-11 (×3): 25 mg via ORAL
  Filled 2013-12-10 (×3): qty 1

## 2013-12-10 MED ORDER — SENNOSIDES-DOCUSATE SODIUM 8.6-50 MG PO TABS
2.0000 | ORAL_TABLET | ORAL | Status: DC
  Administered 2013-12-10 – 2013-12-11 (×2): 2 via ORAL
  Filled 2013-12-10 (×2): qty 2

## 2013-12-10 MED ORDER — ONDANSETRON HCL 4 MG/2ML IJ SOLN
INTRAMUSCULAR | Status: DC | PRN
  Administered 2013-12-10: 4 mg via INTRAVENOUS

## 2013-12-10 MED ORDER — DIBUCAINE 1 % RE OINT: 1.0000 "application " | TOPICAL_OINTMENT | RECTAL | Status: DC | PRN

## 2013-12-10 MED ORDER — PHENYLEPHRINE 8 MG IN D5W 100 ML (0.08MG/ML) PREMIX OPTIME
INJECTION | INTRAVENOUS | Status: AC
  Filled 2013-12-10: qty 100

## 2013-12-10 MED ORDER — KETOROLAC TROMETHAMINE 30 MG/ML IJ SOLN
30.0000 mg | Freq: Four times a day (QID) | INTRAMUSCULAR | Status: DC | PRN
  Administered 2013-12-10: 30 mg via INTRAMUSCULAR

## 2013-12-10 MED ORDER — IBUPROFEN 600 MG PO TABS
600.0000 mg | ORAL_TABLET | Freq: Four times a day (QID) | ORAL | Status: DC
  Administered 2013-12-10 – 2013-12-13 (×12): 600 mg via ORAL
  Filled 2013-12-10 (×12): qty 1

## 2013-12-10 MED ORDER — KETOROLAC TROMETHAMINE 30 MG/ML IJ SOLN
INTRAMUSCULAR | Status: AC
  Administered 2013-12-10: 30 mg via INTRAMUSCULAR
  Filled 2013-12-10: qty 1

## 2013-12-10 MED ORDER — MENTHOL 3 MG MT LOZG: 1.0000 | LOZENGE | OROMUCOSAL | Status: DC | PRN

## 2013-12-10 MED ORDER — ONDANSETRON HCL 4 MG PO TABS: 4.0000 mg | ORAL_TABLET | ORAL | Status: DC | PRN

## 2013-12-10 MED ORDER — SCOPOLAMINE 1 MG/3DAYS TD PT72
MEDICATED_PATCH | TRANSDERMAL | Status: AC
  Administered 2013-12-10: 1.5 mg via TRANSDERMAL
  Filled 2013-12-10: qty 1

## 2013-12-10 SURGICAL SUPPLY — 34 items
BARRIER ADHS 3X4 INTERCEED (GAUZE/BANDAGES/DRESSINGS) ×3 IMPLANT
BENZOIN TINCTURE PRP APPL 2/3 (GAUZE/BANDAGES/DRESSINGS) ×3 IMPLANT
BINDER ABD UNIV 10 28-50 (GAUZE/BANDAGES/DRESSINGS) ×1 IMPLANT
BINDER ABDOM UNIV 10 (GAUZE/BANDAGES/DRESSINGS) ×3
BLADE SURG 10 STRL SS (BLADE) ×6 IMPLANT
CLAMP CORD UMBIL (MISCELLANEOUS) ×3 IMPLANT
CLOSURE WOUND 1/2 X4 (GAUZE/BANDAGES/DRESSINGS) ×1
DRAPE LG THREE QUARTER DISP (DRAPES) ×3 IMPLANT
DRSG OPSITE POSTOP 4X10 (GAUZE/BANDAGES/DRESSINGS) ×3 IMPLANT
DRSG TELFA 3X8 NADH (GAUZE/BANDAGES/DRESSINGS) ×3 IMPLANT
DURAPREP 26ML APPLICATOR (WOUND CARE) ×3 IMPLANT
ELECT REM PT RETURN 9FT ADLT (ELECTROSURGICAL) ×3
ELECTRODE REM PT RTRN 9FT ADLT (ELECTROSURGICAL) ×1 IMPLANT
GLOVE BIOGEL PI IND STRL 6.5 (GLOVE) ×1 IMPLANT
GLOVE BIOGEL PI IND STRL 7.0 (GLOVE) ×5 IMPLANT
GLOVE BIOGEL PI INDICATOR 6.5 (GLOVE) ×2
GLOVE BIOGEL PI INDICATOR 7.0 (GLOVE) ×10
GLOVE SURG SS PI 6.0 STRL IVOR (GLOVE) ×3 IMPLANT
GLOVE SURG SS PI 7.0 STRL IVOR (GLOVE) ×27 IMPLANT
GOWN STRL REUS W/TWL LRG LVL3 (GOWN DISPOSABLE) ×9 IMPLANT
NS IRRIG 1000ML POUR BTL (IV SOLUTION) ×3 IMPLANT
PACK C SECTION WH (CUSTOM PROCEDURE TRAY) ×3 IMPLANT
PAD ABD 7.5X8 STRL (GAUZE/BANDAGES/DRESSINGS) ×3 IMPLANT
PAD OB MATERNITY 4.3X12.25 (PERSONAL CARE ITEMS) ×3 IMPLANT
RTRCTR C-SECT PINK 25CM LRG (MISCELLANEOUS) ×3 IMPLANT
SPONGE GAUZE 4X4 12PLY STER LF (GAUZE/BANDAGES/DRESSINGS) ×3 IMPLANT
STAPLER VISISTAT 35W (STAPLE) IMPLANT
STRIP CLOSURE SKIN 1/2X4 (GAUZE/BANDAGES/DRESSINGS) ×2 IMPLANT
SUT VIC AB 0 CT1 36 (SUTURE) ×18 IMPLANT
SUT VIC AB 4-0 KS 27 (SUTURE) ×3 IMPLANT
TAPE CLOTH SURG 4X10 WHT LF (GAUZE/BANDAGES/DRESSINGS) ×3 IMPLANT
TOWEL OR 17X24 6PK STRL BLUE (TOWEL DISPOSABLE) ×6 IMPLANT
TRAY FOLEY CATH 14FR (SET/KITS/TRAYS/PACK) ×3 IMPLANT
WATER STERILE IRR 1000ML POUR (IV SOLUTION) ×3 IMPLANT

## 2013-12-10 NOTE — Op Note (Signed)
Betty Cain PROCEDURE DATE: 12/10/2013  PREOPERATIVE DIAGNOSES: Intrauterine pregnancy at [redacted]w[redacted]d weeks gestation; previous cesarean section x 3  POSTOPERATIVE DIAGNOSES: The same  PROCEDURE: Repeat Low Transverse Cesarean Section  SURGEON:  Dr. Mora Cain  ASSISTANT:  Dr. Nila Cain  INDICATIONS: Betty Cain is a 38 y.o. E0P2330 at [redacted]w[redacted]d here for cesarean section secondary to the indications listed under preoperative diagnoses; please see preoperative note for further details.  The risks of cesarean section were discussed with the patient including but were not limited to: bleeding which may require transfusion or reoperation; infection which may require antibiotics; injury to bowel, bladder, ureters or other surrounding organs; injury to the fetus; need for additional procedures including hysterectomy in the event of a life-threatening hemorrhage; placental abnormalities wth subsequent pregnancies, incisional problems, thromboembolic phenomenon and other postoperative/anesthesia complications.   The patient concurred with the proposed plan, giving informed written consent for the procedure.    FINDINGS:  Viable female infant in frank breech presentation.  Apgars 8 and 9.  Clear amniotic fluid.  Intact placenta, three vessel cord.  Normal uterus, fallopian tubes and ovaries bilaterally.  ANESTHESIA: Spinal INTRAVENOUS FLUIDS: 3600 ml ESTIMATED BLOOD LOSS: 800 ml URINE OUTPUT:  850 ml SPECIMENS: Placenta sent to pathology COMPLICATIONS: None immediate  PROCEDURE IN DETAIL:  The patient preoperatively received intravenous antibiotics and had sequential compression devices applied to her lower extremities.  She was then taken to the operating room where spinal anesthesia was administered and was found to be adequate. She was then placed in a dorsal supine position with a leftward tilt, and prepped and draped in a sterile manner.  A foley catheter was placed into her bladder and attached  to Betty Cain gravity.  After an adequate timeout was performed, a Pfannenstiel skin incision was made with scalpel and carried through to the underlying layer of fascia. The fascia was incised in the midline, and this incision was extended bilaterally using the Mayo scissors.  Kocher clamps were applied to the superior aspect of the fascial incision and the underlying rectus muscles were dissected off bluntly. A similar process was carried out on the inferior aspect of the fascial incision. The rectus muscles were separated in the midline bluntly with kelly clamp and the peritoneum was entered carefully through dense scar tissue through with Metzenbaum scissors and then extended bluntly. Dense adhesions were noted between uterus and rectus muscles, which were taken down with blunt dissection initially, then with bovie carefully.  Alexis placed in the peritoneal cavity.  Attention was turned to the lower uterine segment where a low transverse hysterotomy was made with a scalpel and extended bilaterally bluntly.  The infant was successfully delivered, the cord was clamped and cut and the infant was handed over to awaiting neonatology team. Uterine massage was then administered, and the placenta delivered intact with a three-vessel cord. The uterus was then cleared of clot and debris.  The hysterotomy was closed with 0 Vicryl in a running locked fashion, and an imbricating layer was also placed with 0 Vicryl. Several figure of eights were placed for hemostasis.The pelvis was cleared of all clot and debris. Hemostasis was confirmed on all surfaces. Interseed placed over the lower uterine segment.  The peritoneum and the muscles were reapproximated using 0 Vicryl interrupted stitches.  The fascia was then closed using 0 Vicryl in a running fashion.  The subcutaneous layer was irrigated. The skin was closed with a 4-0 Vicryl subcuticular stitch. The patient tolerated the procedure well. Sponge, lap, instrument  and needle  counts were correct x 2.  She was taken to the recovery room in stable condition.   Betty Riches, MD 1:48 PM  I was present for the entire length of this procedure and agree with the above documentation

## 2013-12-10 NOTE — H&P (Signed)
Betty Cain is a 38 y.o. female G5P4003 at 69 weeks presenting for scheduled repeat cesarean section. Patient with prenatal care at [facility] hospital clinic since 14 weeks complicated by AMA (declined genetic testing) and previous cesarean section x 3. Patient has had a neonatal death with her first pregnancy. Patient is doing well this morning and is without complaints.   History OB History   Grav Para Term Preterm Abortions TAB SAB Ect Mult Living   5 4 4       3      Past Medical History  Diagnosis Date  . Anemia   . Neonatal death     Vaginal delivery, full term-lived x2 hours.   Marland Kitchen Spinal headache   . IUD (intrauterine device) in place 09/03/2011    Due out in 07/2016  . Valvular heart disease   . Palpitations   . Shortness of breath    Past Surgical History  Procedure Laterality Date  . Cesarean section    . Adenoidectomy      as a child  . Boil  2003    right elbow  . Cesarean section  06/02/2011    Procedure: CESAREAN SECTION;  Surgeon: [name], MD;  Location: [facility];  Service: Gynecology;  Laterality: N/A;  Primary Cesarean Section Delivery Baby Boy @ [phone], Apgars 9/9   Family History: family history includes Diabetes in her father, mother, and sister. There is no history of Anesthesia problems. Social History:  reports that she has never smoked. She has never used smokeless tobacco. She reports that she does not drink alcohol or use illicit drugs.   Prenatal Transfer Tool  Maternal Diabetes: No Genetic Screening: Declined Maternal Ultrasounds/Referrals: Normal Fetal Ultrasounds or other Referrals:  None Maternal Substance Abuse:  No Significant Maternal Medications:  None Significant Maternal Lab Results:  None Other Comments:  None  Review of Systems  All other systems reviewed and are negative.     Blood pressure 123/80, pulse 106, temperature 98.2 F (36.8 C), temperature source Oral, resp. rate 20, last menstrual period 03/12/2013, SpO2 100.00%,  currently breastfeeding. Exam Physical Exam  GENERAL: Well-developed, well-nourished female in no acute distress.  HEENT: Normocephalic, atraumatic. Sclerae anicteric.  NECK: Supple. Normal thyroid.  LUNGS: Clear to auscultation bilaterally.  HEART: Regular rate and rhythm. ABDOMEN: Soft, nontender, gravid. PELVIC: Not indicated EXTREMITIES: No cyanosis, clubbing, or edema, 2+ distal pulses.  Prenatal labs: ABO, Rh: --/--/O POS (08/28 0915) Antibody: NEG (08/28 0915) Rubella: 5.21 (03/10 0922) RPR: NON REAC (08/28 0915)  HBsAg: NEGATIVE (03/10 6720)  HIV: NONREACTIVE (06/04 1502)  GBS: Negative (08/13 0000)   Assessment/Plan: 38 yo G5P4003 at 39 weeks for repeat cesarean section - Risks and benefits were explained including but not limited to risks of bleeding, infection and damage to adjacent organs. Patient verbalized understanding and all questions were answered. - Patient plans on using OCP or Mirena for contraception  Yulitza Shorts 12/10/2013, 8:41 AM

## 2013-12-10 NOTE — Lactation Note (Signed)
This note was copied from the chart of Betty Cain. Lactation Consultation Note Initial visit at 7 hours of age. Mom declines arabic interpreter at this time and is communicating well in [place].   Mom reports not having much milk.  She reports breastfeeding her older children and using formula because of low milk supply.  Explained supply and demand with breast feeding and importance of establishing a good milk supply during the 1st 2 weeks.   Mom is able to demonstrate hand expression with colostrum visible.  Encouraged mom to hold baby STS and offer feeding now, mom agrees.  Assistance needed with modified football hold mom is 7 hours post c/s.  Baby latches with few attempts and maintains good latch with audible swallows.  Mom is encouraged to feed with early feeding cues a minimum of 8-12 times in 24 hours.  [facility] LC resources given and discussed.  Encouraged to feed with early cues on demand.  Early newborn behavior discussed.   Mom to call for assist as needed.    Patient Name: Betty Cain [id number] Date: 12/10/2013 Reason for consult: Initial assessment   Maternal Data Has patient been taught Hand Expression?: Yes Does the patient have breastfeeding experience prior to this delivery?: Yes  Feeding Feeding Type: Breast Fed Length of feed: 10 min  LATCH Score/Interventions Latch: Repeated attempts needed to sustain latch, nipple held in mouth throughout feeding, stimulation needed to elicit sucking reflex.  Audible Swallowing: Spontaneous and intermittent  Type of Nipple: Everted at rest and after stimulation  Comfort (Breast/Nipple): Soft / non-tender     Hold (Positioning): Assistance needed to correctly position infant at breast and maintain latch. Intervention(s): Skin to skin;Position options;Support Pillows;Breastfeeding basics reviewed  LATCH Score: 8  Lactation Tools Discussed/Used     Consult Status Consult Status: Follow-up Date: 12/11/13 Follow-up type:  In-patient    Brunilda Eble, Justine Null 12/10/2013, 5:14 PM

## 2013-12-10 NOTE — Anesthesia Preprocedure Evaluation (Addendum)
Anesthesia Evaluation  Patient identified by MRN, date of birth, ID band Patient awake    Reviewed: Allergy & Precautions, H&P , NPO status , Patient's Chart, lab work & pertinent test results  History of Anesthesia Complications (+) POST - OP SPINAL HEADACHE and history of anesthetic complications  Airway Mallampati: II      Dental   Pulmonary  breath sounds clear to auscultation        Cardiovascular Exercise Tolerance: Good Rhythm:regular Rate:Normal     Neuro/Psych    GI/Hepatic   Endo/Other    Renal/GU      Musculoskeletal   Abdominal   Peds  Hematology  (+) anemia ,   Anesthesia Other Findings   Reproductive/Obstetrics (+) Pregnancy                           Anesthesia Physical Anesthesia Plan  ASA: II  Anesthesia Plan: Spinal   Post-op Pain Management:    Induction:   Airway Management Planned:   Additional Equipment:   Intra-op Plan:   Post-operative Plan:   Informed Consent: I have reviewed the patients History and Physical, chart, labs and discussed the procedure including the risks, benefits and alternatives for the proposed anesthesia with the patient or authorized representative who has indicated his/her understanding and acceptance.     Plan Discussed with: Anesthesiologist, CRNA and Surgeon  Anesthesia Plan Comments:        echo grossly normal, event monitor showed sinus tachycardia Anesthesia Quick Evaluation

## 2013-12-10 NOTE — Anesthesia Postprocedure Evaluation (Signed)
  Anesthesia Post-op Note  Anesthesia Post Note  Patient: Betty Cain  Procedure(s) Performed: Procedure(s) (LRB): REPEAT CESAREAN SECTION (N/A)  Anesthesia type: Spinal  Patient location: PACU  Post pain: Pain level controlled  Post assessment: Post-op Vital signs reviewed  Last Vitals:  Filed Vitals:   12/10/13 1145  BP:   Pulse: 80  Temp:   Resp: 20    Post vital signs: Reviewed  Level of consciousness: awake  Complications: No apparent anesthesia complications

## 2013-12-10 NOTE — Anesthesia Procedure Notes (Signed)
Spinal  Patient location during procedure: OR Start time: 12/10/2013 9:15 AM Staffing Anesthesiologist: Rudean Curt Performed by: anesthesiologist  Preanesthetic Checklist Completed: patient identified, site marked, surgical consent, pre-op evaluation, timeout performed, IV checked, risks and benefits discussed and monitors and equipment checked Spinal Block Patient position: sitting Prep: DuraPrep Patient monitoring: heart rate, cardiac monitor, continuous pulse ox and blood pressure Approach: midline Location: L3-4 Injection technique: single-shot Needle Needle type: Sprotte  Needle gauge: 24 G Needle length: 9 cm Assessment Sensory level: T4 Additional Notes Patient identified.  Risk benefits discussed including failed block, incomplete pain control, headache, nerve damage, paralysis, blood pressure changes, nausea, vomiting, reactions to medication both toxic or allergic, and postpartum back pain.  Patient expressed understanding and wished to proceed.  All questions were answered.  Sterile technique used throughout procedure.  CSF was clear.  No parasthesia or other complications.  Please see nursing notes for vital signs.

## 2013-12-10 NOTE — Transfer of Care (Signed)
Immediate Anesthesia Transfer of Care Note  Patient: Betty Cain  Procedure(s) Performed: Procedure(s): REPEAT CESAREAN SECTION (N/A)  Patient Location: PACU  Anesthesia Type:Spinal  Level of Consciousness: awake, alert  and oriented  Airway & Oxygen Therapy: Patient Spontanous Breathing  Post-op Assessment: Report given to PACU RN and Post -op Vital signs reviewed and stable  Post vital signs: Reviewed and stable  Complications: No apparent anesthesia complications

## 2013-12-11 ENCOUNTER — Encounter (HOSPITAL_COMMUNITY): Payer: Self-pay | Admitting: Obstetrics and Gynecology

## 2013-12-11 DIAGNOSIS — O34219 Maternal care for unspecified type scar from previous cesarean delivery: Secondary | ICD-10-CM | POA: Diagnosis present

## 2013-12-11 LAB — TYPE AND SCREEN
ABO/RH(D): O POS
ANTIBODY SCREEN: NEGATIVE
UNIT DIVISION: 0
Unit division: 0

## 2013-12-11 LAB — CBC
HEMATOCRIT: 30.6 % — AB (ref 36.0–46.0)
Hemoglobin: 10 g/dL — ABNORMAL LOW (ref 12.0–15.0)
MCH: 30.5 pg (ref 26.0–34.0)
MCHC: 32.7 g/dL (ref 30.0–36.0)
MCV: 93.3 fL (ref 78.0–100.0)
PLATELETS: 190 10*3/uL (ref 150–400)
RBC: 3.28 MIL/uL — ABNORMAL LOW (ref 3.87–5.11)
RDW: 15.2 % (ref 11.5–15.5)
WBC: 8.5 10*3/uL (ref 4.0–10.5)

## 2013-12-11 LAB — BIRTH TISSUE RECOVERY COLLECTION (PLACENTA DONATION)

## 2013-12-11 NOTE — Progress Notes (Signed)
Language line called to review plan of care and answer any questions. All questions answered

## 2013-12-11 NOTE — Lactation Note (Signed)
This note was copied from the chart of Betty Amamda Curbow. Lactation Consultation Note  Patient Name: Betty Cain [id number] Date: 12/11/2013 Reason for consult: Follow-up assessment  LC spoke with mom through [place] interpreter and mom verbalizes awareness of importance of frequent breastfeeding for maximum milk production.  LC reviewed LEAD cautions regarding formula supplement.  Mom denies any latching problems but does not believe baby is getting enough milk from breast and has given some formula supplement today.  RN, Lanelle Bal informs LC of recent LATCH score=7. LC encouraged mom to cue feed.   Maternal Data    Feeding Feeding Type: Breast Fed Length of feed: 20 min  LATCH Score/Interventions Latch: Repeated attempts needed to sustain latch, nipple held in mouth throughout feeding, stimulation needed to elicit sucking reflex. Intervention(s): Adjust position  Audible Swallowing: A few with stimulation Intervention(s): Hand expression Intervention(s): Hand expression  Type of Nipple: Everted at rest and after stimulation  Comfort (Breast/Nipple): Soft / non-tender     Hold (Positioning): Assistance needed to correctly position infant at breast and maintain latch.  LATCH Score: 7 (most recent assessment by RN staff)  Lactation Tools Discussed/Used   Cue feedings Supply and demand for milk production  Consult Status Consult Status: Follow-up Date: 12/12/13 Follow-up type: In-patient    Junious Dresser [facility] 12/11/2013, 5:12 PM

## 2013-12-11 NOTE — Addendum Note (Signed)
Addendum created 12/11/13 1031 by Asher Muir, CRNA   Modules edited: Notes Section   Notes Section:  File: [id number]

## 2013-12-11 NOTE — Progress Notes (Signed)
Post Partum Day 1 Subjective:  Marija Calamari is a 38 y.o. [id number] [redacted]w[redacted]d s/p rLCTS.  No acute events overnight.  Pt denies problems with ambulating, voiding or po intake.  She denies nausea or vomiting.  Pain is moderately controlled.  She has had flatus. She has not had bowel movement. Method of Feeding: breast feeding  Objective: Blood pressure 96/59, pulse 80, temperature 98.3 F (36.8 C), temperature source Oral, resp. rate 18, weight 140 lb (63.504 kg), last menstrual period 03/12/2013, SpO2 100.00%, unknown if currently breastfeeding.  Physical Exam:  General: alert, cooperative and no distress Lochia:normal flow Chest: CTAB Heart: RRR no m/r/g Abdomen: +BS, soft, nontender,  Uterine Fundus: firm, pressure dressing in place DVT Evaluation: No evidence of DVT seen on physical exam. Extremities: no edema   Recent Labs  12/11/13 0557  HGB 10.0*  HCT 30.6*    Assessment/Plan:  ASSESSMENT: Teylor Wolven is a 38 y.o. [id number] [redacted]w[redacted]d s/p rLTCS  Plan for discharge tomorrow   LOS: 1 day   Cielle Aguila ROCIO 12/11/2013, 9:53 AM

## 2013-12-11 NOTE — Progress Notes (Signed)
ARABIC INTERPRETER CALLED TO INFORM PATIENT ON PLAN OF CARE WITH HER AND HER INFANT FOR TODAY. FORMULA TEACHING AND BREASTFEEDING TEACHING DONE WITH PATIENT. PATIENT ENCOURAGED TO AMBULATE HALLS AND USE INCENTIVE [place] VIA INTERPRETER. PATIENT HAD ALL QUESTIONS ANSWERED PATIENT STATED SHE WANTED TDAP. REPORT GIVEN IN ROOM.

## 2013-12-11 NOTE — Progress Notes (Signed)
Post Operative Day 1 Subjective: no complaints, up ad lib, voiding and tolerating PO  Objective: Blood pressure 102/66, pulse 83, temperature 98.6 F (37 C), temperature source Oral, resp. rate 20, weight 63.504 kg (140 lb), last menstrual period 03/12/2013, SpO2 100.00%, unknown if currently breastfeeding.  Physical Exam:  General: alert, cooperative and no distress Lochia: appropriate Uterine Fundus: firm Incision: healing well, no significant drainage DVT Evaluation: No evidence of DVT seen on physical exam. Negative Homan's sign. No cords or calf tenderness. No significant calf/ankle edema.   Recent Labs  12/11/13 0557  HGB 10.0*  HCT 30.6*    Assessment/Plan: Plan for discharge tomorrow, Breastfeeding and Contraception OCP/Mirena   LOS: 1 day   Gabriel Cirri, PA-S2 12/11/2013, 7:14 AM    OB fellow attestation Post Partum Day 1 I have seen and examined this patient and agree with above documentation in the PA student's note.   Betty Cain is a 38 y.o. [id number] s/p rLTCS.  Pt denies problems with ambulating, voiding or po intake. Pain is well controlled.  Plan for birth control is IUD.  PE:  BP 115/66  Pulse 95  Temp(Src) 98.4 F (36.9 C) (Oral)  Resp 18  Wt 140 lb (63.504 kg)  SpO2 100%  LMP 03/12/2013  Breastfeeding? Unknown Fundus firm  Plan for discharge: tomorrow, benadryl for pruritus likely 2/2 duramorph  Merla Riches, MD 6:56 PM

## 2013-12-11 NOTE — Plan of Care (Signed)
Problem: Phase II Progression Outcomes Goal: Tolerating diet Outcome: Completed/Met Date Met:  12/11/13 ARABIC INTERPRETER TOLD FOR PATIENT TO ORDER HER FOOD

## 2013-12-11 NOTE — Progress Notes (Signed)
UR completed 

## 2013-12-11 NOTE — Progress Notes (Signed)
Language line used; discuss plan of care for the shift and answered questions. Will continue to monitor.

## 2013-12-11 NOTE — Anesthesia Postprocedure Evaluation (Signed)
Anesthesia Post Note  Patient: Betty Cain  Procedure(s) Performed: Procedure(s) (LRB): REPEAT CESAREAN SECTION (N/A)  Anesthesia type: Spinal  Patient location: Mother/Baby  Post pain: Pain level controlled  Post assessment: Post-op Vital signs reviewed  Last Vitals:  Filed Vitals:   12/11/13 0533  BP: 102/66  Pulse: 83  Temp: 37 C  Resp: 20    Post vital signs: Reviewed  Level of consciousness: awake  Complications: No apparent anesthesia complications

## 2013-12-12 MED ORDER — BISACODYL 10 MG RE SUPP
10.0000 mg | Freq: Once | RECTAL | Status: AC
  Administered 2013-12-12: 10 mg via RECTAL
  Filled 2013-12-12: qty 1

## 2013-12-12 MED ORDER — POLYETHYLENE GLYCOL 3350 17 G PO PACK
17.0000 g | PACK | Freq: Two times a day (BID) | ORAL | Status: DC
  Filled 2013-12-12 (×2): qty 1

## 2013-12-12 MED ORDER — MAGNESIUM CITRATE PO SOLN
1.0000 | Freq: Once | ORAL | Status: DC
  Filled 2013-12-12: qty 296

## 2013-12-12 NOTE — Progress Notes (Signed)
Language line interpreter for Arabic called to explain plan of care and answer  any questions. All questions answered patient stated she wants to stay another day.

## 2013-12-12 NOTE — Progress Notes (Signed)
In house Arabic interpreter in to answer any questions patient had.

## 2013-12-12 NOTE — Progress Notes (Signed)
Post Operative Day 2 Subjective: up ad lib, voiding, tolerating PO and + flatus  Objective: Blood pressure 112/75, pulse 100, temperature 99 F (37.2 C), temperature source Oral, resp. rate 17, weight 63.504 kg (140 lb), last menstrual period 03/12/2013, SpO2 100.00%, unknown if currently breastfeeding.  Physical Exam:  General: alert, cooperative and no distress Lochia: appropriate Uterine Fundus: firm Incision: healing well, no significant drainage DVT Evaluation: No evidence of DVT seen on physical exam. Negative Homan's sign. No cords or calf tenderness. No significant calf/ankle edema.   Recent Labs  12/11/13 0557  HGB 10.0*  HCT 30.6*    Assessment/Plan: Plan for discharge tomorrow, Breastfeeding and Contraception OCP/Mirena   LOS: 2 days   Gabriel Cirri, PA-S2 12/12/2013, 7:01 AM   OB fellow attestation Post Partum Day 2 I have seen and examined this patient and agree with above documentation in the pa student's note.   Betty Cain is a 38 y.o. [id number] s/p rLTCS.  Pt denies problems with ambulating, voiding or po intake. Pain is moderately well controlled.  She is passing gas but feels very constipated  PE:  BP 111/68  Pulse 102  Temp(Src) 99.5 F (37.5 C) (Oral)  Resp 20  Wt 140 lb (63.504 kg)  SpO2 100%  LMP 03/12/2013  Breastfeeding? Unknown Fundus firm  Plan for discharge: tomorrow Miralax, mag citrate  Merla Riches, MD 8:42 PM

## 2013-12-12 NOTE — Progress Notes (Signed)
Interpreter called; patient expressed how she was feeling and had no questions.  Stated she had a bowel movement and did not want and laxative or stool softner.  Stated she just wanted gas pill and pain medications. Will continue to monitor.

## 2013-12-12 NOTE — Plan of Care (Signed)
Problem: Phase II Progression Outcomes Goal: Pain controlled on oral analgesia Outcome: Completed/Met Date Met:  12/12/13 Patient informed of asking for percocet when needed via interpreter language line

## 2013-12-12 NOTE — Lactation Note (Signed)
This note was copied from the chart of Betty Angelie Kram. Lactation Consultation Note  Arabic Interpreter present. Mother states breastfeeding going well.  Reviewed supply and demand and to breastfeed first and then supplement if she desires. Reviewed hand expression.  Mother's colostrum squirted out, very good volume at this time. Mother states her nipples are sore.  Was given lanolin.  Suggest she apply ebm and provided her with comfort gels and a hand pump. Encouraged mother to be sure baby is deep on breast to prevent soreness.  Call for help with latch if needed.  Patient Name: Betty Cain [id number] Date: 12/12/2013 Reason for consult: Follow-up assessment   Maternal Data    Feeding    LATCH Score/Interventions                      Lactation Tools Discussed/Used     Consult Status Consult Status: Follow-up Date: 12/13/13 Follow-up type: In-patient    Vivianne Master [facility] 12/12/2013, 12:05 PM

## 2013-12-12 NOTE — Progress Notes (Signed)
In house interpreter into inform patient of plan of care and do teaching.

## 2013-12-13 MED ORDER — OXYCODONE-ACETAMINOPHEN 5-325 MG PO TABS: 1.0000 | ORAL_TABLET | ORAL | Status: DC | PRN

## 2013-12-13 MED ORDER — IBUPROFEN 600 MG PO TABS: 600.0000 mg | ORAL_TABLET | Freq: Four times a day (QID) | ORAL | Status: DC | PRN

## 2013-12-13 NOTE — Discharge Summary (Signed)
Obstetric Discharge Summary Reason for Admission: cesarean section Prenatal Procedures: none Intrapartum Procedures: cesarean: low vertical, transverse Postpartum Procedures: none Complications-Operative and Postpartum: none Hemoglobin  Date Value Ref Range Status  12/11/2013 10.0* 12.0 - 15.0 g/dL Final     HCT  Date Value Ref Range Status  12/11/2013 30.6* 36.0 - 46.0 % Final    Physical Exam:  General: alert, cooperative and no distress Lochia: appropriate Uterine Fundus: firm Incision: healing well, no significant drainage DVT Evaluation: No evidence of DVT seen on physical exam. Negative Homan's sign. No cords or calf tenderness. No significant calf/ankle edema.  PREOPERATIVE DIAGNOSES: Intrauterine pregnancy at [redacted]w[redacted]d weeks gestation; previous cesarean section x 3   POSTOPERATIVE DIAGNOSES: The same   PROCEDURE: Repeat Low Transverse Cesarean Section   SURGEON: Dr. [name]  ASSISTANT: Dr. Nila Nephew  INDICATIONS: Betty Cain is a 38 y.o. [id number] at [redacted]w[redacted]d here for cesarean section secondary to the indications listed under preoperative diagnoses; please see preoperative note for further details. The risks of cesarean section were discussed with the patient including but were not limited to: bleeding which may require transfusion or reoperation; infection which may require antibiotics; injury to bowel, bladder, ureters or other surrounding organs; injury to the fetus; need for additional procedures including hysterectomy in the event of a life-threatening hemorrhage; placental abnormalities wth subsequent pregnancies, incisional problems, thromboembolic phenomenon and other postoperative/anesthesia complications. The patient concurred with the proposed plan, giving informed written consent for the procedure.   FINDINGS: Viable female infant in frank breech presentation. Apgars 8 and 9. Clear amniotic fluid. Intact placenta, three vessel cord. Normal uterus, fallopian  tubes and ovaries bilaterally.   Discharge Diagnoses: Term Pregnancy-delivered  Discharge Information: Date: 12/13/2013 Activity: pelvic rest Diet: routine Medications: PNV, Ibuprofen and Percocet Condition: stable Instructions: refer to practice specific booklet Discharge to: home Follow-up Information   Call [facility]. (6 week appt reminder)    Specialty:  Obstetrics and Gynecology   Contact information:   [address] [place] [address] [phone]      Follow up with [place]. (As needed in emergencies)    Contact information:   [id number] [place] [address] [phone]      Newborn Data: Live born female  Birth Weight: 6 lb 11.9 oz (3060 g) APGAR: 8, 9  Home with mother.  Betty Cain 12/13/2013, 11:35 AM

## 2013-12-13 NOTE — Discharge Instructions (Signed)
Cesarean Delivery, Care After °Refer to this sheet in the next few weeks. These instructions provide you with information on caring for yourself after your procedure. Your health care provider may also give you specific instructions. Your treatment has been planned according to current medical practices, but problems sometimes occur. Call your health care provider if you have any problems or questions after you go home. °HOME CARE INSTRUCTIONS  °· Only take over-the-counter or prescription medications as directed by your health care provider. °· Do not drink alcohol, especially if you are breastfeeding or taking medication to relieve pain. °· Do not chew or smoke tobacco. °· Continue to use good perineal care. Good perineal care includes: °¨ Wiping your perineum from front to back. °¨ Keeping your perineum clean. °· Check your surgical cut (incision) daily for increased redness, drainage, swelling, or separation of skin. °· Clean your incision gently with soap and water every day, and then pat it dry. If your health care provider says it is okay, leave the incision uncovered. Use a bandage (dressing) if the incision is draining fluid or appears irritated. If the adhesive strips across the incision do not fall off within 7 days, carefully peel them off. °· Hug a pillow when coughing or sneezing until your incision is healed. This helps to relieve pain. °· Do not use tampons or douche until your health care provider says it is okay. °· Shower, wash your hair, and take tub baths as directed by your health care provider. °· Wear a well-fitting bra that provides breast support. °· Limit wearing support panties or control-top hose. °· Drink enough fluids to keep your urine clear or pale yellow. °· Eat high-fiber foods such as whole grain cereals and breads, brown rice, beans, and fresh fruits and vegetables every day. These foods may help prevent or relieve constipation. °· Resume activities such as climbing stairs,  driving, lifting, exercising, or traveling as directed by your health care provider. °· Talk to your health care provider about resuming sexual activities. This is dependent upon your risk of infection, your rate of healing, and your comfort and desire to resume sexual activity. °· Try to have someone help you with your household activities and your newborn for at least a few days after you leave the hospital. °· Rest as much as possible. Try to rest or take a nap when your newborn is sleeping. °· Increase your activities gradually. °· Keep all of your scheduled postpartum appointments. It is very important to keep your scheduled follow-up appointments. At these appointments, your health care provider will be checking to make sure that you are healing physically and emotionally. °SEEK MEDICAL CARE IF:  °· You are passing large clots from your vagina. Save any clots to show your health care provider. °· You have a foul smelling discharge from your vagina. °· You have trouble urinating. °· You are urinating frequently. °· You have pain when you urinate. °· You have a change in your bowel movements. °· You have increasing redness, pain, or swelling near your incision. °· You have pus draining from your incision. °· Your incision is separating. °· You have painful, hard, or reddened breasts. °· You have a severe headache. °· You have blurred vision or see spots. °· You feel sad or depressed. °· You have thoughts of hurting yourself or your newborn. °· You have questions about your care, the care of your newborn, or medications. °· You are dizzy or light-headed. °· You have a rash. °· You   have pain, redness, or swelling at the site of the removed intravenous access (IV) tube. °· You have nausea or vomiting. °· You stopped breastfeeding and have not had a menstrual period within 12 weeks of stopping. °· You are not breastfeeding and have not had a menstrual period within 12 weeks of delivery. °· You have a fever. °SEEK  IMMEDIATE MEDICAL CARE IF: °· You have persistent pain. °· You have chest pain. °· You have shortness of breath. °· You faint. °· You have leg pain. °· You have stomach pain. °· Your vaginal bleeding saturates 2 or more sanitary pads in 1 hour. °MAKE SURE YOU:  °· Understand these instructions. °· Will watch your condition. °· Will get help right away if you are not doing well or get worse. °Document Released: 12/19/2001 Document Revised: 08/13/2013 Document Reviewed: 11/24/2011 °[employer]® Patient Information ©2015 [employer]. This information is not intended to replace advice given to you by your health care provider. Make sure you discuss any questions you have with your health care provider. °Breastfeeding °Deciding to breastfeed is one of the best choices you can make for you and your baby. A change in hormones during pregnancy causes your breast tissue to grow and increases the number and size of your milk ducts. These hormones also allow proteins, sugars, and fats from your blood supply to make breast milk in your milk-producing glands. Hormones prevent breast milk from being released before your baby is born as well as prompt milk flow after birth. Once breastfeeding has begun, thoughts of your baby, as well as his or her sucking or crying, can stimulate the release of milk from your milk-producing glands.  °BENEFITS OF BREASTFEEDING °For Your Baby °· Your first milk (colostrum) helps your baby's digestive system function better.   °· There are antibodies in your milk that help your baby fight off infections.   °· Your baby has a lower incidence of asthma, allergies, and sudden infant death syndrome.   °· The nutrients in breast milk are better for your baby than infant formulas and are designed uniquely for your baby's needs.   °· Breast milk improves your baby's brain development.   °· Your baby is less likely to develop other conditions, such as childhood obesity, asthma, or type 2 diabetes mellitus.    °For You  °· Breastfeeding helps to create a very special bond between you and your baby.   °· Breastfeeding is convenient. Breast milk is always available at the correct temperature and costs nothing.   °· Breastfeeding helps to burn calories and helps you lose the weight gained during pregnancy.   °· Breastfeeding makes your uterus contract to its prepregnancy size faster and slows bleeding (lochia) after you give birth.   °· Breastfeeding helps to lower your risk of developing type 2 diabetes mellitus, osteoporosis, and breast or ovarian cancer later in life. °SIGNS THAT YOUR BABY IS HUNGRY °Early Signs of Hunger  °· Increased alertness or activity. °· Stretching. °· Movement of the head from side to side. °· Movement of the head and opening of the mouth when the corner of the mouth or cheek is stroked (rooting). °· Increased sucking sounds, smacking lips, cooing, sighing, or squeaking. °· Hand-to-mouth movements. °· Increased sucking of fingers or hands. °Late Signs of Hunger °· Fussing. °· Intermittent crying. °Extreme Signs of Hunger °Signs of extreme hunger will require calming and consoling before your baby will be able to breastfeed successfully. Do not wait for the following signs of extreme hunger to occur before you initiate breastfeeding:   °·   Restlessness. °· A loud, strong cry. °·  Screaming. °BREASTFEEDING BASICS °Breastfeeding Initiation °· Find a comfortable place to sit or lie down, with your neck and back well supported. °· Place a pillow or rolled up blanket under your baby to bring him or her to the level of your breast (if you are seated). Nursing pillows are specially designed to help support your arms and your baby while you breastfeed. °· Make sure that your baby's abdomen is facing your abdomen.   °· Gently massage your breast. With your fingertips, massage from your chest wall toward your nipple in a circular motion. This encourages milk flow. You may need to continue this action during  the feeding if your milk flows slowly. °· Support your breast with 4 fingers underneath and your thumb above your nipple. Make sure your fingers are well away from your nipple and your baby's mouth.   °· Stroke your baby's lips gently with your finger or nipple.   °· When your baby's mouth is open wide enough, quickly bring your baby to your breast, placing your entire nipple and as much of the colored area around your nipple (areola) as possible into your baby's mouth.   °¨ More areola should be visible above your baby's upper lip than below the lower lip.   °¨ Your baby's tongue should be between his or her lower gum and your breast.   °· Ensure that your baby's mouth is correctly positioned around your nipple (latched). Your baby's lips should create a seal on your breast and be turned out (everted). °· It is common for your baby to suck about 2-3 minutes in order to start the flow of breast milk. °Latching °Teaching your baby how to latch on to your breast properly is very important. An improper latch can cause nipple pain and decreased milk supply for you and poor weight gain in your baby. Also, if your baby is not latched onto your nipple properly, he or she may swallow some air during feeding. This can make your baby fussy. Burping your baby when you switch breasts during the feeding can help to get rid of the air. However, teaching your baby to latch on properly is still the best way to prevent fussiness from swallowing air while breastfeeding. °Signs that your baby has successfully latched on to your nipple:    °· Silent tugging or silent sucking, without causing you pain.   °· Swallowing heard between every 3-4 sucks.   °·  Muscle movement above and in front of his or her ears while sucking.   °Signs that your baby has not successfully latched on to nipple:  °· Sucking sounds or smacking sounds from your baby while breastfeeding. °· Nipple pain. °If you think your baby has not latched on correctly, slip  your finger into the corner of your baby's mouth to break the suction and place it between your baby's gums. Attempt breastfeeding initiation again. °Signs of Successful Breastfeeding °Signs from your baby:   °· A gradual decrease in the number of sucks or complete cessation of sucking.   °· Falling asleep.   °· Relaxation of his or her body.   °· Retention of a small amount of milk in his or her mouth.   °· Letting go of your breast by himself or herself. °Signs from you: °· Breasts that have increased in firmness, weight, and size 1-3 hours after feeding.   °· Breasts that are softer immediately after breastfeeding. °· Increased milk volume, as well as a change in milk consistency and color by the fifth day of   breastfeeding.   °· Nipples that are not sore, cracked, or bleeding. °Signs That Your Baby is Getting Enough Milk °· Wetting at least 3 diapers in a 24-hour period. The urine should be clear and pale yellow by age 5 days. °· At least 3 stools in a 24-hour period by age 5 days. The stool should be soft and yellow. °· At least 3 stools in a 24-hour period by age 7 days. The stool should be seedy and yellow. °· No loss of weight greater than 10% of birth weight during the first 3 days of age. °· Average weight gain of 4-7 ounces (113-198 g) per week after age 4 days. °· Consistent daily weight gain by age 5 days, without weight loss after the age of 2 weeks. °After a feeding, your baby may spit up a small amount. This is common. °BREASTFEEDING FREQUENCY AND DURATION °Frequent feeding will help you make more milk and can prevent sore nipples and breast engorgement. Breastfeed when you feel the need to reduce the fullness of your breasts or when your baby shows signs of hunger. This is called "breastfeeding on demand." Avoid introducing a pacifier to your baby while you are working to establish breastfeeding (the first 4-6 weeks after your baby is born). After this time you may choose to use a pacifier. Research  has shown that pacifier use during the first year of a baby's life decreases the risk of sudden infant death syndrome (SIDS). °Allow your baby to feed on each breast as long as he or she wants. Breastfeed until your baby is finished feeding. When your baby unlatches or falls asleep while feeding from the first breast, offer the second breast. Because newborns are often sleepy in the first few weeks of life, you may need to awaken your baby to get him or her to feed. °Breastfeeding times will vary from baby to baby. However, the following rules can serve as a guide to help you ensure that your baby is properly fed: °· Newborns (babies 4 weeks of age or younger) may breastfeed every 1-3 hours. °· Newborns should not go longer than 3 hours during the day or 5 hours during the night without breastfeeding. °· You should breastfeed your baby a minimum of 8 times in a 24-hour period until you begin to introduce solid foods to your baby at around 6 months of age. °BREAST MILK PUMPING °Pumping and storing breast milk allows you to ensure that your baby is exclusively fed your breast milk, even at times when you are unable to breastfeed. This is especially important if you are going back to work while you are still breastfeeding or when you are not able to be present during feedings. Your lactation consultant can give you guidelines on how long it is safe to store breast milk.  °A breast pump is a machine that allows you to pump milk from your breast into a sterile bottle. The pumped breast milk can then be stored in a refrigerator or freezer. Some breast pumps are operated by hand, while others use electricity. Ask your lactation consultant which type will work best for you. Breast pumps can be purchased, but some hospitals and breastfeeding support groups lease breast pumps on a monthly basis. A lactation consultant can teach you how to hand express breast milk, if you prefer not to use a pump.  °CARING FOR YOUR BREASTS  WHILE YOU BREASTFEED °Nipples can become dry, cracked, and sore while breastfeeding. The following recommendations can help keep your   breasts moisturized and healthy: °· Avoid using soap on your nipples.   °· Wear a supportive bra. Although not required, special nursing bras and tank tops are designed to allow access to your breasts for breastfeeding without taking off your entire bra or top. Avoid wearing underwire-style bras or extremely tight bras. °· Air dry your nipples for 3-4 minutes after each feeding.   °· Use only cotton bra pads to absorb leaked breast milk. Leaking of breast milk between feedings is normal.   °· Use lanolin on your nipples after breastfeeding. Lanolin helps to maintain your skin's normal moisture barrier. If you use pure lanolin, you do not need to wash it off before feeding your baby again. Pure lanolin is not toxic to your baby. You may also hand express a few drops of breast milk and gently massage that milk into your nipples and allow the milk to air dry. °In the first few weeks after giving birth, some women experience extremely full breasts (engorgement). Engorgement can make your breasts feel heavy, warm, and tender to the touch. Engorgement peaks within 3-5 days after you give birth. The following recommendations can help ease engorgement: °· Completely empty your breasts while breastfeeding or pumping. You may want to start by applying warm, moist heat (in the shower or with warm water-soaked hand towels) just before feeding or pumping. This increases circulation and helps the milk flow. If your baby does not completely empty your breasts while breastfeeding, pump any extra milk after he or she is finished. °· Wear a snug bra (nursing or regular) or tank top for 1-2 days to signal your body to slightly decrease milk production. °· Apply ice packs to your breasts, unless this is too uncomfortable for you. °· Make sure that your baby is latched on and positioned properly while  breastfeeding. °If engorgement persists after 48 hours of following these recommendations, contact your health care provider or a lactation consultant. °OVERALL HEALTH CARE RECOMMENDATIONS WHILE BREASTFEEDING °· Eat healthy foods. Alternate between meals and snacks, eating 3 of each per day. Because what you eat affects your breast milk, some of the foods may make your baby more irritable than usual. Avoid eating these foods if you are sure that they are negatively affecting your baby. °· Drink milk, fruit juice, and water to satisfy your thirst (about 10 glasses a day).   °· Rest often, relax, and continue to take your prenatal vitamins to prevent fatigue, stress, and anemia. °· Continue breast self-awareness checks. °· Avoid chewing and smoking tobacco. °· Avoid alcohol and drug use. °Some medicines that may be harmful to your baby can pass through breast milk. It is important to ask your health care provider before taking any medicine, including all over-the-counter and prescription medicine as well as vitamin and herbal supplements. °It is possible to become pregnant while breastfeeding. If birth control is desired, ask your health care provider about options that will be safe for your baby. °SEEK MEDICAL CARE IF:  °· You feel like you want to stop breastfeeding or have become frustrated with breastfeeding. °· You have painful breasts or nipples. °· Your nipples are cracked or bleeding. °· Your breasts are red, tender, or warm. °· You have a swollen area on either breast. °· You have a fever or chills. °· You have nausea or vomiting. °· You have drainage other than breast milk from your nipples. °· Your breasts do not become full before feedings by the fifth day after you give birth. °· You feel sad and   depressed. °· Your baby is too sleepy to eat well. °· Your baby is having trouble sleeping.   °· Your baby is wetting less than 3 diapers in a 24-hour period. °· Your baby has less than 3 stools in a 24-hour  period. °· Your baby's skin or the white part of his or her eyes becomes yellow.   °· Your baby is not gaining weight by 5 days of age. °SEEK IMMEDIATE MEDICAL CARE IF:  °· Your baby is overly tired (lethargic) and does not want to wake up and feed. °· Your baby develops an unexplained fever. °Document Released: 03/29/2005 Document Revised: 04/03/2013 Document Reviewed: 09/20/2012 °[employer]® Patient Information ©2015 [employer]. This information is not intended to replace advice given to you by your health care provider. Make sure you discuss any questions you have with your health care provider. ° °

## 2014-01-15 ENCOUNTER — Inpatient Hospital Stay (HOSPITAL_COMMUNITY)
Admission: AD | Admit: 2014-01-15 | Discharge: 2014-01-15 | Disposition: A | Discharge status: Home / Self Care | Payer: Medicaid Other | Source: Ambulatory Visit | Attending: Obstetrics & Gynecology | Admitting: Obstetrics & Gynecology

## 2014-01-15 ENCOUNTER — Encounter (HOSPITAL_COMMUNITY): Payer: Self-pay | Admitting: *Deleted

## 2014-01-15 ENCOUNTER — Inpatient Hospital Stay (HOSPITAL_COMMUNITY): Payer: Medicaid Other

## 2014-01-15 DIAGNOSIS — R300 Dysuria: Secondary | ICD-10-CM | POA: Diagnosis not present

## 2014-01-15 DIAGNOSIS — R079 Chest pain, unspecified: Secondary | ICD-10-CM | POA: Diagnosis present

## 2014-01-15 DIAGNOSIS — K219 Gastro-esophageal reflux disease without esophagitis: Secondary | ICD-10-CM | POA: Diagnosis not present

## 2014-01-15 DIAGNOSIS — R1013 Epigastric pain: Secondary | ICD-10-CM

## 2014-01-15 DIAGNOSIS — R51 Headache: Secondary | ICD-10-CM | POA: Insufficient documentation

## 2014-01-15 LAB — COMPREHENSIVE METABOLIC PANEL
ALBUMIN: 3.8 g/dL (ref 3.5–5.2)
ALK PHOS: 84 U/L (ref 39–117)
ALT: 17 U/L (ref 0–35)
ANION GAP: 15 (ref 5–15)
AST: 16 U/L (ref 0–37)
BUN: 9 mg/dL (ref 6–23)
CALCIUM: 9.2 mg/dL (ref 8.4–10.5)
CO2: 24 mEq/L (ref 19–32)
CREATININE: 0.46 mg/dL — AB (ref 0.50–1.10)
Chloride: 102 mEq/L (ref 96–112)
GFR calc non Af Amer: >90 mL/min (ref >90)
Glucose, Bld: 79 mg/dL (ref 70–99)
POTASSIUM: 3.8 meq/L (ref 3.7–5.3)
Sodium: 141 mEq/L (ref 137–147)
TOTAL PROTEIN: 7.3 g/dL (ref 6.0–8.3)
Total Bilirubin: 0.4 mg/dL (ref 0.3–1.2)

## 2014-01-15 LAB — LIPASE, BLOOD: Lipase: 41 U/L (ref 11–59)

## 2014-01-15 LAB — URINALYSIS, ROUTINE W REFLEX MICROSCOPIC
Bilirubin Urine: NEGATIVE
Glucose, UA: NEGATIVE mg/dL
Ketones, ur: NEGATIVE mg/dL
LEUKOCYTES UA: NEGATIVE
Nitrite: NEGATIVE
PH: 7.5 (ref 5.0–8.0)
PROTEIN: NEGATIVE mg/dL
Specific Gravity, Urine: 1.01 (ref 1.005–1.030)
Urobilinogen, UA: 0.2 mg/dL (ref 0.0–1.0)

## 2014-01-15 LAB — URINE MICROSCOPIC-ADD ON

## 2014-01-15 LAB — CBC
HEMATOCRIT: 36.1 % (ref 36.0–46.0)
Hemoglobin: 12 g/dL (ref 12.0–15.0)
MCH: 29.9 pg (ref 26.0–34.0)
MCHC: 33.2 g/dL (ref 30.0–36.0)
MCV: 89.8 fL (ref 78.0–100.0)
Platelets: 262 10*3/uL (ref 150–400)
RBC: 4.02 MIL/uL (ref 3.87–5.11)
RDW: 13.5 % (ref 11.5–15.5)
WBC: 7.2 10*3/uL (ref 4.0–10.5)

## 2014-01-15 LAB — AMYLASE: AMYLASE: 83 U/L (ref 0–105)

## 2014-01-15 LAB — POCT PREGNANCY, URINE: Preg Test, Ur: NEGATIVE

## 2014-01-15 MED ORDER — GI COCKTAIL ~~LOC~~
30.0000 mL | Freq: Once | ORAL | Status: AC
  Administered 2014-01-15: 30 mL via ORAL
  Filled 2014-01-15: qty 30

## 2014-01-15 MED ORDER — OXYCODONE-ACETAMINOPHEN 5-325 MG PO TABS
1.0000 | ORAL_TABLET | Freq: Once | ORAL | Status: AC
  Administered 2014-01-15: 1 via ORAL
  Filled 2014-01-15: qty 1

## 2014-01-15 MED ORDER — OXYCODONE-ACETAMINOPHEN 5-325 MG PO TABS: 1.0000 | ORAL_TABLET | ORAL | Status: DC | PRN

## 2014-01-15 MED ORDER — PANTOPRAZOLE SODIUM 20 MG PO TBEC: 20.0000 mg | DELAYED_RELEASE_TABLET | Freq: Every day | ORAL | Status: DC

## 2014-01-15 NOTE — MAU Provider Note (Signed)
History     CSN: [id number]  Arrival date and time: 01/15/14 1227   First Provider Initiated Contact with Patient 01/15/14 1532      Chief Complaint  Patient presents with  . Chest Pain  . Headache  . Emesis   Chest Pain  Associated symptoms include abdominal pain (midsternal), back pain (midback), headaches, nausea and vomiting. Fever: feels hot at home; highest temp recorded 101.  Headache  Associated symptoms include abdominal pain (midsternal), back pain (midback), nausea and vomiting. Fever: feels hot at home; highest temp recorded 101.  Emesis  Associated symptoms include abdominal pain (midsternal), chest pain, chills and headaches. Fever: feels hot at home; highest temp recorded 101.    Pt is a [id number] here with report of C/S on August 31. Pt states she has been having abd pain when eating ever since delivery, but it has been worse for the past 2 days.  Pt reports vomiting approximately 3x/day.  Reports having headache daily for past 10 days.  Noticed stabbing pain in mid chest for past 5 days that increases with palpation of chest.  +breastfeeding.    History obtained by Ruel Favors [phone] Past Medical History  Diagnosis Date  . Anemia   . Neonatal death     Vaginal delivery, full term-lived x2 hours.   Marland Kitchen Spinal headache   . IUD (intrauterine device) in place 09/03/2011    Due out in 07/2016  . Valvular heart disease   . Palpitations   . Shortness of breath     Past Surgical History  Procedure Laterality Date  . Cesarean section    . Adenoidectomy      as a child  . Boil  2003    right elbow  . Cesarean section  06/02/2011    Procedure: CESAREAN SECTION;  Surgeon: [name], MD;  Location: [facility];  Service: Gynecology;  Laterality: N/A;  Primary Cesarean Section Delivery Baby Boy @ 0004, Apgars 9/9  . Cesarean section N/A 12/10/2013    Procedure: REPEAT CESAREAN SECTION;  Surgeon: [name], MD;  Location: [facility];  Service: Obstetrics;   Laterality: N/A;    Family History  Problem Relation Age of Onset  . Anesthesia problems Neg Hx   . Diabetes Mother   . Diabetes Father   . Diabetes Sister     History  Substance Use Topics  . Smoking status: Never Smoker   . Smokeless tobacco: Never Used  . Alcohol Use: No    Allergies: No Known Allergies  Prescriptions prior to admission  Medication Sig Dispense Refill  . loratadine (CLARITIN) 10 MG tablet Take 1 tablet (10 mg total) by mouth daily.  30 tablet  2  . pantoprazole (PROTONIX) 20 MG tablet Take 1 tablet (20 mg total) by mouth at bedtime.  30 tablet  1  . acetaminophen (TYLENOL) 325 MG tablet Take 650 mg by mouth every 6 (six) hours as needed for headache.       . calcium carbonate (TUMS) 500 MG chewable tablet Chew 1 tablet (200 mg of elemental calcium total) by mouth every 12 (twelve) hours as needed for indigestion or heartburn.  50 tablet  1  . ferrous sulfate (FERROUSUL) 325 (65 FE) MG tablet Take 1 tablet (325 mg total) by mouth 2 (two) times daily.  60 tablet  1  . folic acid (FOLVITE) 1 MG tablet Take 1 mg by mouth daily.      Marland Kitchen ibuprofen (ADVIL,MOTRIN) 600 MG tablet Take 1 tablet (  600 mg total) by mouth every 6 (six) hours as needed.  30 tablet  1  . oxyCODONE-acetaminophen (PERCOCET/ROXICET) 5-325 MG per tablet Take 1-2 tablets by mouth every 4 (four) hours as needed for severe pain (moderate - severe pain).  30 tablet  0  . Prenatal Vit-Fe Fumarate-FA (PRENATAL MULTIVITAMIN) TABS tablet Take 1 tablet by mouth daily at 12 noon.      . promethazine (PHENERGAN) 25 MG tablet Take 0.5-1 tablets (12.5-25 mg total) by mouth every 6 (six) hours as needed for nausea.  30 tablet  2    Review of Systems  Constitutional: Positive for chills. Fever: feels hot at home; highest temp recorded 101.  Cardiovascular: Positive for chest pain.  Gastrointestinal: Positive for nausea, vomiting and abdominal pain (midsternal).  Genitourinary: Positive for dysuria. Negative for  urgency and frequency.       Scant bleeding reported  Musculoskeletal: Positive for back pain (midback).  Neurological: Positive for headaches.   Physical Exam   Blood pressure 117/74, pulse 85, temperature 98.9 F (37.2 C), temperature source Oral, resp. rate 18, SpO2 100.00%, currently breastfeeding.  Physical Exam  Constitutional: She is oriented to person, place, and time. She appears well-developed and well-nourished. No distress.  HENT:  Head: Normocephalic.  Neck: Normal range of motion. Neck supple.  Cardiovascular: Normal rate, regular rhythm and normal heart sounds.   Respiratory: Effort normal and breath sounds normal. Right breast exhibits no mass and no tenderness. Left breast exhibits no mass and no tenderness.  GI: Soft. She exhibits no mass. There is tenderness (midepigastric). There is no rebound and no guarding.  Genitourinary: Uterus is enlarged (slightly enlarged). Uterus is not tender. Bleeding: scant; dark red; no bright red or brisk bleed. Vaginal discharge: vaginal bleeding.  csecton incision; well healed, no signs of infection  Neurological: She is alert and oriented to person, place, and time. She has normal reflexes.  Skin: Skin is warm and dry.    MAU Course  Procedures Results for orders placed during the hospital encounter of 01/15/14 (from the past 24 hour(s))  URINALYSIS, ROUTINE W REFLEX MICROSCOPIC     Status: Abnormal   Collection Time    01/15/14  1:13 PM      Result Value Ref Range   Color, Urine YELLOW  YELLOW   APPearance CLEAR  CLEAR   Specific Gravity, Urine 1.010  1.005 - 1.030   pH 7.5  5.0 - 8.0   Glucose, UA NEGATIVE  NEGATIVE mg/dL   Hgb urine dipstick TRACE (*) NEGATIVE   Bilirubin Urine NEGATIVE  NEGATIVE   Ketones, ur NEGATIVE  NEGATIVE mg/dL   Protein, ur NEGATIVE  NEGATIVE mg/dL   Urobilinogen, UA 0.2  0.0 - 1.0 mg/dL   Nitrite NEGATIVE  NEGATIVE   Leukocytes, UA NEGATIVE  NEGATIVE  URINE MICROSCOPIC-ADD ON     Status:  Abnormal   Collection Time    01/15/14  1:13 PM      Result Value Ref Range   Squamous Epithelial / LPF FEW (*) RARE   RBC / HPF 3-6  <3 RBC/hpf   Bacteria, UA RARE  RARE  POCT PREGNANCY, URINE     Status: None   Collection Time    01/15/14  3:32 PM      Result Value Ref Range   Preg Test, Ur NEGATIVE  NEGATIVE  CBC     Status: None   Collection Time    01/15/14  4:05 PM      Result  Value Ref Range   WBC 7.2  4.0 - 10.5 K/uL   RBC 4.02  3.87 - 5.11 MIL/uL   Hemoglobin 12.0  12.0 - 15.0 g/dL   HCT 36.1  36.0 - 46.0 %   MCV 89.8  78.0 - 100.0 fL   MCH 29.9  26.0 - 34.0 pg   MCHC 33.2  30.0 - 36.0 g/dL   RDW 13.5  11.5 - 15.5 %   Platelets 262  150 - 400 K/uL  COMPREHENSIVE METABOLIC PANEL     Status: Abnormal   Collection Time    01/15/14  4:05 PM      Result Value Ref Range   Sodium 141  137 - 147 mEq/L   Potassium 3.8  3.7 - 5.3 mEq/L   Chloride 102  96 - 112 mEq/L   CO2 24  19 - 32 mEq/L   Glucose, Bld 79  70 - 99 mg/dL   BUN 9  6 - 23 mg/dL   Creatinine, Ser 0.46 (*) 0.50 - 1.10 mg/dL   Calcium 9.2  8.4 - 10.5 mg/dL   Total Protein 7.3  6.0 - 8.3 g/dL   Albumin 3.8  3.5 - 5.2 g/dL   AST 16  0 - 37 U/L   ALT 17  0 - 35 U/L   Alkaline Phosphatase 84  39 - 117 U/L   Total Bilirubin 0.4  0.3 - 1.2 mg/dL   GFR calc non Af Amer >90  >90 mL/min   GFR calc Af Amer >90  >90 mL/min   Anion gap 15  5 - 15  AMYLASE     Status: None   Collection Time    01/15/14  4:05 PM      Result Value Ref Range   Amylase 83  0 - 105 U/L  LIPASE, BLOOD     Status: None   Collection Time    01/15/14  4:05 PM      Result Value Ref Range   Lipase 41  11 - 59 U/L    Ultrasound: FINDINGS:  Gallbladder:  No gallstones or wall thickening visualized. No sonographic Murphy  sign noted.  Common bile duct:  Diameter: 4 mm.  Liver:  No focal lesion identified. Within normal limits in parenchymal  echogenicity. Incidental note made of heterogeneous echotexture in  the pancreas. Active  pancreatic pathologic including pancreatitis  cannot be entirely excluded.  IMPRESSION:  1. Liver and gallbladder are unremarkable. No biliary distention.  2. Heterogeneous pancreatic echotexture incidentally noted.  Pancreatitis cannot be excluded.  [id number]  103 Pt reports pain improved with GI cocktail > desires discharge home Assessment and Plan  GERD Dysuria - normal labs Headaches  Plan: Discharge to home Protonix 20 mg hs RX Percocet 5/325 #15 Urine culture sent to lab Keep scheduled appointment in clinic on Friday  [place], [facility] N 01/15/2014, 3:41 PM

## 2014-01-15 NOTE — MAU Note (Signed)
C/S on August 31.  Pt states she has been having abd pain when eating ever since delivery, but it has been worse for the past 2 days, has been vomiting.  Has HA, back pain, has L side chest pain that radiates at times.

## 2014-01-16 LAB — URINE CULTURE
Colony Count: NO GROWTH
Culture: NO GROWTH

## 2014-01-18 ENCOUNTER — Ambulatory Visit (INDEPENDENT_AMBULATORY_CARE_PROVIDER_SITE_OTHER): Payer: Medicaid Other | Admitting: Obstetrics and Gynecology

## 2014-01-18 ENCOUNTER — Encounter: Payer: Self-pay | Admitting: Obstetrics and Gynecology

## 2014-01-18 VITALS — BP 134/74 | HR 102 | Temp 98.0°F | Ht <= 58 in | Wt 126.9 lb

## 2014-01-18 DIAGNOSIS — Z30017 Encounter for initial prescription of implantable subdermal contraceptive: Secondary | ICD-10-CM

## 2014-01-18 LAB — POCT PREGNANCY, URINE: Preg Test, Ur: NEGATIVE

## 2014-01-18 MED ORDER — ETONOGESTREL 68 MG ~~LOC~~ IMPL
68.0000 mg | DRUG_IMPLANT | Freq: Once | SUBCUTANEOUS | Status: AC
  Administered 2014-01-18: 68 mg via SUBCUTANEOUS

## 2014-01-18 NOTE — Progress Notes (Signed)
  Subjective:     Betty Cain is a 38 y.o. female who presents for a postpartum visit. She is 5 weeks postpartum following a low cervical transverse Cesarean section. I have fully reviewed the prenatal and intrapartum course. The delivery was at 72 gestational weeks. Outcome: repeat cesarean section, low transverse incision. Anesthesia: spinal. Postpartum course has been uncomplicated. Baby's course has been uncomplicated. Baby is feeding by breast. Bleeding no bleeding. Bowel function is normal. Bladder function is normal. Patient is not sexually active. Contraception method is none. Postpartum depression screening: negative.     Review of Systems A comprehensive review of systems was negative.   Objective:    There were no vitals taken for this visit.  General:  alert, cooperative and no distress   Breasts:  inspection negative, no nipple discharge or bleeding, no masses or nodularity palpable  Lungs: clear to auscultation bilaterally  Heart:  regular rate and rhythm  Abdomen: soft, non-tender; bowel sounds normal; no masses,  no organomegaly and incision: healed nicely. No erythema, induration or drainage   Vulva:  normal  Vagina: normal vagina, no discharge, exudate, lesion, or erythema  Cervix:  no cervical motion tenderness  Corpus: normal size, contour, position, consistency, mobility, non-tender  Adnexa:  normal adnexa and no mass, fullness, tenderness  Rectal Exam: Not performed.        Assessment:     Normal postpartum exam. Pap smear not done at today's visit.   Plan:    1. Contraception: Nexplanon Patient given informed consent, signed copy in the chart, time out was performed. Pregnancy test was negative. Appropriate time out taken.  Patient's left arm was prepped and draped in the usual sterile fashion.. The ruler used to measure and mark insertion area.  Pt was prepped with alcohol swab and then injected with 1 cc of 1% lidocaine with epinephrine.  Pt was prepped with  betadine, Implanon removed form packaging,  Device confirmed in needle, then inserted full length of needle and withdrawn per handbook instructions.  Pt insertion site covered with pressure dressing.   Minimal blood loss.  Pt tolerated the procedure well.  2. Patient is medically cleared to resume all activities of daily living 3. Follow up in: march 2016 for annual exam or as needed.  Patient reports improvement in chest discomfort s/p protonix. Encouraged the continued use of protonix

## 2014-01-21 ENCOUNTER — Ambulatory Visit (HOSPITAL_COMMUNITY)
Admission: RE | Admit: 2014-01-21 | Discharge: 2014-01-21 | Disposition: A | Discharge status: Home / Self Care | Payer: Medicaid Other | Source: Ambulatory Visit | Attending: Obstetrics and Gynecology | Admitting: Obstetrics and Gynecology

## 2014-01-22 NOTE — Lactation Note (Addendum)
Lactation Consult  Mother's reason for visit: Mother is concerned of low milk supply  Consult:  Initial Lactation Consultant:  Betty Cain  ________________________________________________________________________  [name] Name: Betty Cain  Date of Birth: 12/10/2011  Pediatrician: Betty Cain Gender: female  Gestational Age: 106w0d (At Birth)  Birth Weight: 6 lb 11.9 oz (3060 g)  Weight at Discharge: Weight: 6 lb 7.4 oz (2930 g) Date of Discharge: 12/13/2013  Filed Weights   12/11/13 0057 12/12/13 0247 12/13/13 0220  Weight: 6 lb 8.1 oz (2950 g) 6 lb 6.1 oz (2895 g) 6 lb 7.4 oz (2930 g)    Weight today: 8 lbs 14.7 oz (4046gms)  ________________________________________________________________________  Mother's Name: Betty Cain Breastfeeding Experience:  8 months to 2 years with other 3 children Maternal Medical Conditions: none Maternal Medications:  none  ________________________________________________________________________  Breastfeeding History (Post Discharge)  Frequency of breastfeeding: feeds q 4 hours per mother Duration of feeding: 30 minutes  Supplementation  Formula:  Volume 30-74ml Frequency: 2-3 times per day when acting hungry after breastfeeding        Brand: Similac  Breastmilk:  Has not pumped or supplemented with breast milk, only given formula. Patient has a hand pump at home that she knows how to use. Method:  Bottle  Infant Intake and Output Assessment  Voids:  4-5 in 24 hrs.  Color:  Clear yellow Stools:  1 in 24 hrs.  Color:  Yellow ________________________________________________________________________  Maternal Breast Assessment  Breast:  Soft Nipple:  Erect Pain level:  0 Pain interventions: none  _______________________________________________________________________ Feeding Assessment/Evaluation  Initial feeding assessment:  Infant's oral assessment:  WNL  Positioning:  Cradle  Feeding served from right and left  breast  LATCH documentation:  Latch:  2 = Grasps breast easily, tongue down, lips flanged, rhythmical sucking.  Audible swallowing:  2 = Spontaneous and intermittent  Type of nipple:  2 = Everted at rest and after stimulation  Comfort (Breast/Nipple):  2 = Soft / non-tender  Hold (Positioning):  2 = No assistance needed to correctly position infant at breast  LATCH score:  10  Attached assessment:  Deep  Lips flanged:  Yes.     Suck assessment:  Nutritive   Pre-feed weight:  4046 g   Post-feed weight:  4128 g Amount transferred: 82 ml  Baby transferred 36 ml form the right breast and 46 ml form the left. Baby latched well and fed rhythmically from both breast. Mother denied pain. Baby was alert, smiling, and calm during the feeding. Mother reports supplementing with formula while breastfeeding her other children as babies. Mother was pleased and surprised that her baby took this volume of milk. Advised to breast feed with cues and not wait for 4 hours. Instructed that night and frequent breastfeeding helps to increase milk volume especially during growth spurts. Encouragement and reassurance given. Encouraged mother to pump during the day after feeding to increase supply, as needed, and feed baby her expressed milk opposed to formula. Baby has ped appointment on Nov. 2.  Mom to call Betty Cain or MD if any concerns. An interpreter arranged by [facility] was present for the appointment due to a language barrier.

## 2014-01-30 ENCOUNTER — Encounter: Payer: Self-pay | Admitting: *Deleted

## 2014-02-11 ENCOUNTER — Encounter: Payer: Self-pay | Admitting: Obstetrics and Gynecology

## 2014-10-08 ENCOUNTER — Encounter: Payer: Self-pay | Admitting: Family Medicine

## 2014-10-08 ENCOUNTER — Ambulatory Visit (INDEPENDENT_AMBULATORY_CARE_PROVIDER_SITE_OTHER): Payer: No Typology Code available for payment source | Admitting: Family Medicine

## 2014-10-08 VITALS — BP 118/71 | HR 94 | Temp 98.9°F | Resp 16 | Ht <= 58 in | Wt 151.0 lb

## 2014-10-08 DIAGNOSIS — R300 Dysuria: Secondary | ICD-10-CM

## 2014-10-08 DIAGNOSIS — Z0000 Encounter for general adult medical examination without abnormal findings: Secondary | ICD-10-CM

## 2014-10-08 LAB — POCT URINALYSIS DIP (DEVICE)
Bilirubin Urine: NEGATIVE
GLUCOSE, UA: NEGATIVE mg/dL
Ketones, ur: NEGATIVE mg/dL
Leukocytes, UA: NEGATIVE
NITRITE: NEGATIVE
PH: 5.5 (ref 5.0–8.0)
PROTEIN: NEGATIVE mg/dL
SPECIFIC GRAVITY, URINE: 1.01 (ref 1.005–1.030)
UROBILINOGEN UA: 0.2 mg/dL (ref 0.0–1.0)

## 2014-10-08 MED ORDER — NAPROXEN 500 MG PO TABS: 500.0000 mg | ORAL_TABLET | Freq: Two times a day (BID) | ORAL | Status: DC

## 2014-10-08 MED ORDER — SULFAMETHOXAZOLE-TRIMETHOPRIM 800-160 MG PO TABS: 1.0000 | ORAL_TABLET | Freq: Two times a day (BID) | ORAL | Status: DC

## 2014-10-08 MED ORDER — FLUTICASONE FUROATE-VILANTEROL 100-25 MCG/INH IN AEPB: INHALATION_SPRAY | RESPIRATORY_TRACT | Status: DC

## 2014-10-08 MED ORDER — FLUTICASONE PROPIONATE 50 MCG/ACT NA SUSP: 2.0000 | Freq: Every day | NASAL | Status: DC

## 2014-10-08 MED ORDER — LORATADINE 10 MG PO TABS: 10.0000 mg | ORAL_TABLET | Freq: Every day | ORAL | Status: DC

## 2014-10-08 NOTE — Progress Notes (Signed)
Patient ID: Betty Cain, female   DOB: [birth year], 39 y.o.   MRN: [id number]   Betty Cain, is a 39 y.o. female  FVC:[id number]  MBW:[id number]  DOB - 1975-08-12  CC: No chief complaint on file.      HPI: Betty Cain is a 39 y.o. female here today to establish medical care.She has a history of seasonal allergies and is on Flonase and claritin. She has a history of endocarditis, palpitations, and valvular issue. She has been evaluated by cardiology and she reports he did not feel she needed to follow-up wilth him. She reports episodes of palpitations lasting maybe 15 minutes. She also c/o some intermittent dizziness and some shortness of breath. She is not aware if the three symptoms are related. She also has chronic back pain and some headaches. She uses naproxen for these as well as tylenol if needed. She complaining of easy bruising and is concerned about this. She reports tender lumps under her right arm at this time. She has had some dysuria and frequency for 4 days.    No Known Allergies Past Medical History  Diagnosis Date  . Anemia   . Neonatal death     Vaginal delivery, full term-lived x2 hours.   Marland Kitchen Spinal headache   . IUD (intrauterine device) in place 09/03/2011    Due out in 07/2016  . Valvular heart disease   . Palpitations   . Shortness of breath    Current Outpatient Prescriptions on File Prior to Visit  Medication Sig Dispense Refill  . folic acid (FOLVITE) 1 MG tablet Take 1 mg by mouth daily.    Marland Kitchen oxyCODONE-acetaminophen (PERCOCET/ROXICET) 5-325 MG per tablet Take 1-2 tablets by mouth every 4 (four) hours as needed for severe pain (moderate - severe pain). 15 tablet 0  . pantoprazole (PROTONIX) 20 MG tablet Take 1 tablet (20 mg total) by mouth daily. 30 tablet 1   No current facility-administered medications on file prior to visit.   Family History  Problem Relation Age of Onset  . Anesthesia problems Neg Hx   . Diabetes Mother   . Diabetes Father   .  Diabetes Sister    History   Social History  . Marital Status: Married    Spouse Name: N/A  . Number of Children: N/A  . Years of Education: N/A   Occupational History  . Not on file.   Social History Main Topics  . Smoking status: Never Smoker   . Smokeless tobacco: Never Used  . Alcohol Use: No  . Drug Use: No  . Sexual Activity: Yes    Birth Control/ Protection: None     Comment: pregnant   Other Topics Concern  . Not on file   Social History Narrative    Review of Systems: Constitutional: Negative for fever, chills, appetite change, weight loss,  fatigue. HENT: Negative for ear pain, ear discharge.nose bleeds Eyes: Negative for pain, discharge, redness, itching and visual disturbance. Neck: Negative for pain, stiffness Respiratory: Negative  for cough. Positive for intermittent shortness of breath,   Cardiovascular: Positive for chest pain 9tightness with the SOB), palpitations Negative for leg swelling. Gastrointestinal:Positive for occassionbal abd pain and diarrhea. Is not a major problem. Negative for nausea/vomiting Genitourinary: Positive for dysuria, urgency, frequency. Negative for hematuria, flank pain,  Musculoskeletal: Positive  for back pain, joint pain, joint  swelling, arthralgia Negative for weakness and difficulty walking She does have perceived weakness in right shoulder Neurological: Negative for tremors, seizures, syncope,   light-headedness,  numbness and headaches. Positive for occassional dizziness with position change. Hematological: Positivefor easy bruising  Psychiatric/Behavioral: Negative for depression, anxiety, decreased concentration, confusion   Objective:  There were no vitals filed for this visit.  Physical Exam: Constitutional: Patient appears well-developed and well-nourished. No distress. HENT: Normocephalic, atraumatic, External right and left ear normal. Oropharynx is clear and moist.  Eyes: Conjunctivae and EOM are normal.  PERRLA, no scleral icterus. Neck: Normal ROM. Neck supple. No lymphadenopathy, No thyromegaly. CVS: RRR, S1/S2 +, no murmurs, no gallops, no rubs Pulmonary: Effort and breath sounds normal, no stridor, rhonchi, wheezes, rales. Pulse ox 100% Abdominal: Soft. Normoactive BS,, no distension, tenderness, rebound or guarding.  Musculoskeletal: Normal range of motion. No edema and no tenderness. Shoulder movement on right w/i normal limits. Neuro: Alert.Normal muscle tone coordination. Non-focal Skin: Skin is warm and dry.  Not diaphoretic. No erythema. No pallor.There are three tender nodules in the right axilla Psychiatric: Normal mood and affect. Behavior, judgment, thought content normal.  Lab Results  Component Value Date   WBC 7.2 01/15/2014   HGB 12.0 01/15/2014   HCT 36.1 01/15/2014   MCV 89.8 01/15/2014   PLT 262 01/15/2014   Lab Results  Component Value Date   CREATININE 0.46* 01/15/2014   BUN 9 01/15/2014   NA 141 01/15/2014   K 3.8 01/15/2014   CL 102 01/15/2014   CO2 24 01/15/2014    No results found for: HGBA1C Lipid Panel  No results found for: CHOL, TRIG, HDL, CHOLHDL, VLDL, LDLCALC     Assessment and plan:   Health maintenance -Cmet with GFR, CBC, lipid panel - follow-up past results. -follow-up six months here  Shortness of breath Palpitations Dizziness - I have asked her to keep a record of above symptoms  I have asked her to record time, symptom, how long it last, and what she is doing.  Back pain - Continue use of naproxen as needed and follow-up as needed.  Headaches -Continue Naproxen and/or tylenol and follow-up as needed.  Tender nodules in right axilla -Septra DS# 20, one po bid x 10 days -warm compresses -follow-up if not improving.    The patient was given clear instructions to go to ER or return to medical center if symptoms don't improve, worsen or new problems develop. The patient verbalized understanding. The patient was told to call  to get lab results if they haven't heard anything in the next week.        Micheline Chapman, MSN, FNP-BC    10/08/2014, 10:14 AM

## 2014-10-08 NOTE — Patient Instructions (Signed)
1. Keep a written record of episodes of shortness of breath, palpitations, and dizziness.  Write what time of day it is, what you are doing and how long each episode lasts. 2. Return and bring list in one month. 3. Continue treatment for conditions as you currently are. 4. Call in one week if you have not heard from [place] about your blood work.

## 2014-10-09 LAB — CBC WITH DIFFERENTIAL/PLATELET
BASOS ABS: 0 10*3/uL (ref 0.0–0.1)
Basophils Relative: 0 % (ref 0–1)
Eosinophils Absolute: 0.1 10*3/uL (ref 0.0–0.7)
Eosinophils Relative: 2 % (ref 0–5)
HEMATOCRIT: 35.6 % — AB (ref 36.0–46.0)
HEMOGLOBIN: 11.5 g/dL — AB (ref 12.0–15.0)
LYMPHS ABS: 2.9 10*3/uL (ref 0.7–4.0)
LYMPHS PCT: 42 % (ref 12–46)
MCH: 27 pg (ref 26.0–34.0)
MCHC: 32.3 g/dL (ref 30.0–36.0)
MCV: 83.6 fL (ref 78.0–100.0)
MONO ABS: 0.4 10*3/uL (ref 0.1–1.0)
MPV: 11.1 fL (ref 8.6–12.4)
Monocytes Relative: 5 % (ref 3–12)
Neutro Abs: 3.6 10*3/uL (ref 1.7–7.7)
Neutrophils Relative %: 51 % (ref 43–77)
PLATELETS: 343 10*3/uL (ref 150–400)
RBC: 4.26 MIL/uL (ref 3.87–5.11)
RDW: 14.8 % (ref 11.5–15.5)
WBC: 7 10*3/uL (ref 4.0–10.5)

## 2014-10-09 LAB — COMPLETE METABOLIC PANEL WITH GFR
ALK PHOS: 55 U/L (ref 39–117)
ALT: 8 U/L (ref 0–35)
AST: 11 U/L (ref 0–37)
Albumin: 4.2 g/dL (ref 3.5–5.2)
BUN: 13 mg/dL (ref 6–23)
CALCIUM: 9.2 mg/dL (ref 8.4–10.5)
CO2: 20 mEq/L (ref 19–32)
CREATININE: 0.5 mg/dL (ref 0.50–1.10)
Chloride: 107 mEq/L (ref 96–112)
GFR, Est African American: >89 mL/min
GFR, Est Non African American: >89 mL/min
Glucose, Bld: 107 mg/dL — ABNORMAL HIGH (ref 70–99)
Potassium: 3.9 mEq/L (ref 3.5–5.3)
Sodium: 139 mEq/L (ref 135–145)
Total Bilirubin: 0.3 mg/dL (ref 0.2–1.2)
Total Protein: 7.2 g/dL (ref 6.0–8.3)

## 2014-10-09 LAB — LIPID PANEL
CHOL/HDL RATIO: 5.5 ratio
Cholesterol: 165 mg/dL (ref 0–200)
HDL: 30 mg/dL — ABNORMAL LOW (ref >=46)
LDL Cholesterol: 114 mg/dL — ABNORMAL HIGH (ref 0–99)
Triglycerides: 107 mg/dL (ref <150)
VLDL: 21 mg/dL (ref 0–40)

## 2014-10-15 ENCOUNTER — Ambulatory Visit: Payer: No Typology Code available for payment source | Attending: Internal Medicine

## 2014-10-25 ENCOUNTER — Other Ambulatory Visit: Payer: Self-pay | Admitting: Internal Medicine

## 2014-10-25 MED ORDER — FLUTICASONE FUROATE-VILANTEROL 100-25 MCG/INH IN AEPB: INHALATION_SPRAY | RESPIRATORY_TRACT | Status: DC

## 2014-11-07 ENCOUNTER — Encounter: Payer: Self-pay | Admitting: Family Medicine

## 2014-11-07 ENCOUNTER — Ambulatory Visit (INDEPENDENT_AMBULATORY_CARE_PROVIDER_SITE_OTHER): Payer: No Typology Code available for payment source | Admitting: Family Medicine

## 2014-11-07 VITALS — BP 107/65 | HR 97 | Temp 98.8°F | Resp 14 | Ht <= 58 in | Wt 148.0 lb

## 2014-11-07 DIAGNOSIS — R0600 Dyspnea, unspecified: Secondary | ICD-10-CM

## 2014-11-07 DIAGNOSIS — R51 Headache: Secondary | ICD-10-CM

## 2014-11-07 DIAGNOSIS — G8929 Other chronic pain: Secondary | ICD-10-CM

## 2014-11-07 DIAGNOSIS — I471 Supraventricular tachycardia: Secondary | ICD-10-CM

## 2014-11-07 MED ORDER — METOPROLOL TARTRATE 25 MG PO TABS: 12.5000 mg | ORAL_TABLET | Freq: Two times a day (BID) | ORAL | Status: DC

## 2014-11-07 NOTE — Progress Notes (Signed)
Subjective:     Patient ID: Betty Cain, female   DOB: 11/25/1975, 39 y.o.   MRN: [id number]  HPI   Patient presents today for follow-up of dizziness, palpitations, and shortness of breath. I saw her a couple of weeks ago and she reported these symptoms but could not tell me if they were associated for separate issues.  Today she reports the dizziness seems to be gone but she is experiencing palpitations, shortness of breath and migraine headaches. She still is unable to tell me if they seem to be related. We are obtaining this information with the help of an interpreter which does present a challenge in sorting things out. She does have a long history of migraine. She has used tylenol with varying degrees of success.  I do notice in reviewing her chart that on the last several visits her pulse has been elevated between 94 and 110. It is 94 at rest today.  Review of Systems   Denies: chills, fever, loss of appetite, loss of weight Denies skin rashed or lesions Denies neck pain, frequent sore throats and swollen lympth nodes Admits to palpitations but no chest pain Admits to shortness of breath but no coughing or wheezing Denies GI symptoms    Objective:   Physical Exam   Alert, oriented, appropriate in no acute distress. HENT: w/i normal limits Neck supple FROM Lungs are clear to auscultation Heart sounds are regular but rapid     Assessment Plan       Intermittent tachycardia with SOB - Lopressor 12.5 mg bid, hopefully this will help the tachycardia, shortness of breath and help prevent some of the headaches  Migraine Headaches -Lopressor as above -continue current abortive therapy     Micheline Chapman, FNP-BC Follow-up in one month with symptom diary

## 2014-11-07 NOTE — Patient Instructions (Signed)
Take one of the metoprolol 2 times a day I am hoping this will slow your heart down, which should stop the shortness of breath and hopefull will also help the Headaches. Follow-up in one month with a symptom diary.

## 2014-12-09 ENCOUNTER — Ambulatory Visit (INDEPENDENT_AMBULATORY_CARE_PROVIDER_SITE_OTHER): Payer: No Typology Code available for payment source | Admitting: Family Medicine

## 2014-12-09 ENCOUNTER — Encounter: Payer: Self-pay | Admitting: Family Medicine

## 2014-12-09 ENCOUNTER — Other Ambulatory Visit: Payer: Self-pay | Admitting: Family Medicine

## 2014-12-09 VITALS — BP 112/79 | HR 99 | Temp 98.8°F | Resp 18 | Ht <= 58 in | Wt 150.0 lb

## 2014-12-09 DIAGNOSIS — I471 Supraventricular tachycardia, unspecified: Secondary | ICD-10-CM

## 2014-12-09 MED ORDER — METOPROLOL TARTRATE 25 MG PO TABS: 12.5000 mg | ORAL_TABLET | Freq: Two times a day (BID) | ORAL | Status: DC

## 2014-12-11 NOTE — Progress Notes (Signed)
Patient ID: Betty Cain, female   DOB: 08/21/1975, 39 y.o.   MRN: [id number] Patient presented today for a recheck of SVT. Recently started her on metoprolol. She presents today having run out of medication. I have refilled the medication and asked her to reschedule. To take medication regularly and return having taken medications on the day of her visit.

## 2014-12-19 ENCOUNTER — Ambulatory Visit (INDEPENDENT_AMBULATORY_CARE_PROVIDER_SITE_OTHER): Payer: No Typology Code available for payment source | Admitting: Family Medicine

## 2014-12-19 VITALS — BP 112/67 | HR 89 | Temp 99.3°F | Resp 16 | Ht <= 58 in | Wt 150.0 lb

## 2014-12-19 DIAGNOSIS — R300 Dysuria: Secondary | ICD-10-CM

## 2014-12-19 DIAGNOSIS — K029 Dental caries, unspecified: Secondary | ICD-10-CM

## 2014-12-19 DIAGNOSIS — R5383 Other fatigue: Secondary | ICD-10-CM

## 2014-12-19 DIAGNOSIS — I471 Supraventricular tachycardia: Secondary | ICD-10-CM

## 2014-12-19 LAB — POCT URINALYSIS DIP (DEVICE)
Bilirubin Urine: NEGATIVE
GLUCOSE, UA: NEGATIVE mg/dL
Ketones, ur: NEGATIVE mg/dL
Leukocytes, UA: NEGATIVE
Nitrite: NEGATIVE
PROTEIN: 30 mg/dL — AB
Specific Gravity, Urine: 1.025 (ref 1.005–1.030)
UROBILINOGEN UA: 0.2 mg/dL (ref 0.0–1.0)
pH: 6.5 (ref 5.0–8.0)

## 2014-12-19 LAB — TSH: TSH: 2.171 u[IU]/mL (ref 0.350–4.500)

## 2014-12-19 MED ORDER — METOPROLOL TARTRATE 25 MG PO TABS: 25.0000 mg | ORAL_TABLET | Freq: Two times a day (BID) | ORAL | Status: DC

## 2014-12-19 NOTE — Patient Instructions (Addendum)
Start taking a whole pill 2 times a day rather than 1/2 pill of metoprolol Come back if not feeling better in 1-2 weeks. We are sending a referral for dentist, someone should call you.

## 2014-12-19 NOTE — Progress Notes (Signed)
Patient ID: Betty Cain, female   DOB: 1976-02-16, 39 y.o.   MRN: [id number]   Betty Cain, is a 39 y.o. female  LOV:[id number]  OAC:[id number]  DOB - [birth year]  CC:  Chief Complaint  Patient presents with  . Follow-up    when breathing in pain in chest   . Tachycardia       HPI: Betty Cain is a 39 y.o. female presents for follow-up of rapid heart rate. She was found to have heart rates over 100 on numbeous occassiona in the past. On her last visit, i started her on Lopressor 25 mg, 1/2 tablet bid. She reports she still feels like her heart is racing occasionally but that it is much better.  She reports fatigue. She also reports over the last 3-4 days, she has had episodes where she is experiencing weird sensations in her hands. She feels like at times that she is unable to feel her hands. She also reports some dysuria and lower abd pain for the last few days. She does have asthma and reports some imtermittent shortness of breath, coughing and wheezing. Due to language barrier, I am unable to get a clear picture of some of her symptoms.   No Known Allergies Past Medical History  Diagnosis Date  . Anemia   . Neonatal death     Vaginal delivery, full term-lived x2 hours.   Marland Kitchen Spinal headache   . IUD (intrauterine device) in place 09/03/2011    Due out in 07/2016  . Valvular heart disease   . Palpitations   . Shortness of breath    Current Outpatient Prescriptions on File Prior to Visit  Medication Sig Dispense Refill  . fluticasone (FLONASE) 50 MCG/ACT nasal spray Place 2 sprays into both nostrils daily. 1 g 11  . Fluticasone Furoate-Vilanterol (BREO ELLIPTA) 100-25 MCG/INH AEPB One inhalation daily 3 each 3  . folic acid (FOLVITE) 1 MG tablet Take 1 mg by mouth daily.    Marland Kitchen loratadine (CLARITIN) 10 MG tablet Take 1 tablet (10 mg total) by mouth daily. 90 tablet 3  . montelukast (SINGULAIR) 10 MG tablet Take 10 mg by mouth at bedtime.    . naproxen (NAPROSYN) 500 MG tablet Take 1  tablet (500 mg total) by mouth 2 (two) times daily with a meal. 60 tablet 1  . pantoprazole (PROTONIX) 20 MG tablet Take 1 tablet (20 mg total) by mouth daily. 30 tablet 1  . oxyCODONE-acetaminophen (PERCOCET/ROXICET) 5-325 MG per tablet Take 1-2 tablets by mouth every 4 (four) hours as needed for severe pain (moderate - severe pain). (Patient not taking: Reported on 10/08/2014) 15 tablet 0  . sulfamethoxazole-trimethoprim (BACTRIM DS,SEPTRA DS) 800-160 MG per tablet Take 1 tablet by mouth 2 (two) times daily. (Patient not taking: Reported on 11/07/2014) 10 tablet 0   No current facility-administered medications on file prior to visit.   Family History  Problem Relation Age of Onset  . Anesthesia problems Neg Hx   . Diabetes Mother   . Diabetes Father   . Diabetes Sister    Social History   Social History  . Marital Status: Married    Spouse Name: N/A  . Number of Children: N/A  . Years of Education: N/A   Occupational History  . Not on file.   Social History Main Topics  . Smoking status: Never Smoker   . Smokeless tobacco: Never Used  . Alcohol Use: No  . Drug Use: No  . Sexual Activity: Yes  Birth Control/ Protection: None     Comment: pregnant   Other Topics Concern  . Not on file   Social History Narrative    Review of Systems: Constitutional: Negative for fever, chills, appetite change, weight loss,  Fatigue. Skin: Negative for rashes or lesions of concern. HENT: Negative for ear pain, ear discharge.nose bleeds. Positive for some nasal congestion.  Eyes: Negative for pain, discharge, redness, itching and visual disturbance. Neck: Negative for pain, stiffness Respiratory: Negative for cough, shortness of breath,   Cardiovascular: Negative for chest pain, and leg swelling. Positive for occassional palpitations. Gastrointestinal: Negative for abdominal pain, nausea, vomiting, diarrhea, constipations Genitourinary: Negative for urgency, frequency, hematuria,  Positive for dysuria, lower abd pain. Musculoskeletal: Negative for back pain, joint pain, joint  swelling, and gait problem.Negative for weakness. Neurological: Negative for dizziness, tremors, seizures, syncope,   light-headedness, numbness and headaches. Reports inability to feel her hand intermittently for last few days. Hematological: Negative for easy bruising or bleeding Psychiatric/Behavioral: Negative for depression, anxiety, decreased concentration, confusion   Objective:   Filed Vitals:   12/19/14 1150  BP: 112/67  Pulse: 89  Temp: 99.3 F (37.4 C)  Resp: 16    Physical Exam: Constitutional: Patient appears well-developed and well-nourished. No distress. HENT: Normocephalic, atraumatic, External right and left ear normal. Oropharynx is clear and moist.  Eyes: Conjunctivae and EOM are normal. PERRLA, no scleral icterus. Neck: Normal ROM. Neck supple. No lymphadenopathy, No thyromegaly. CVS: RRR, S1/S2 +, no murmurs, no gallops, no rubs Pulmonary: Effort and breath sounds normal, no stridor, rhonchi, wheezes, rales.  Abdominal: Soft. Normoactive BS,, no distension,  rebound or guarding. There is generalized tenderness over the lower abdomen Musculoskeletal: Normal range of motion. No edema and no tenderness.  Neuro: Alert.Normal muscle tone coordination. Non-focal. Cranial Nerves II-XII intact. There is no arm drift, grips are stong and equal, senstation intact.  Skin: Skin is warm and dry. No rash noted. Not diaphoretic. No erythema. No pallor. Psychiatric: Normal mood and affect. Behavior, judgment, thought content normal.   Lab Results  Component Value Date   WBC 7.0 10/08/2014   HGB 11.5* 10/08/2014   HCT 35.6* 10/08/2014   MCV 83.6 10/08/2014   PLT 343 10/08/2014   Lab Results  Component Value Date   CREATININE 0.50 10/08/2014   BUN 13 10/08/2014   NA 139 10/08/2014   K 3.9 10/08/2014   CL 107 10/08/2014   CO2 20 10/08/2014    No results found for:  HGBA1C Lipid Panel     Component Value Date/Time   CHOL 165 10/08/2014 1121   TRIG 107 10/08/2014 1121   HDL 30* 10/08/2014 1121   CHOLHDL 5.5 10/08/2014 1121   VLDL 21 10/08/2014 1121   LDLCALC 114* 10/08/2014 1121       Assessment and plan:   1. Other fatigue  - TSH - Vitamin D 1,25 dihydroxy  2. Dysuria  - Urine culture - POCT urinalysis dipstick  3. Parasthesis -I have asked her to give this a few days and to note what is going on when this happens and any associated symptoms. Particularly to note her emotional state.  4. Dental Caries -Referral to dentist.   Return if symptoms worsen or fail to improve.  The patient was given clear instructions to go to ER or return to medical center if symptoms don't improve, worsen or new problems develop. The patient verbalized understanding.    Micheline Chapman FNP  12/19/2014, 3:27 PM

## 2014-12-20 ENCOUNTER — Encounter: Payer: Self-pay | Admitting: Family Medicine

## 2014-12-21 LAB — VITAMIN D 1,25 DIHYDROXY
VITAMIN D 1, 25 (OH) TOTAL: 80 pg/mL — AB (ref 18–72)
Vitamin D2 1, 25 (OH)2: <8 pg/mL
Vitamin D3 1, 25 (OH)2: 80 pg/mL

## 2014-12-22 LAB — URINE CULTURE

## 2014-12-23 ENCOUNTER — Encounter: Payer: Self-pay | Admitting: Obstetrics & Gynecology

## 2014-12-23 ENCOUNTER — Ambulatory Visit (INDEPENDENT_AMBULATORY_CARE_PROVIDER_SITE_OTHER): Payer: Self-pay | Admitting: Obstetrics & Gynecology

## 2014-12-23 VITALS — BP 122/76 | HR 96 | Temp 98.8°F | Ht 60.0 in | Wt 151.4 lb

## 2014-12-23 DIAGNOSIS — R102 Pelvic and perineal pain: Secondary | ICD-10-CM

## 2014-12-23 LAB — POCT URINALYSIS DIP (DEVICE)
Bilirubin Urine: NEGATIVE
GLUCOSE, UA: NEGATIVE mg/dL
KETONES UR: NEGATIVE mg/dL
Leukocytes, UA: NEGATIVE
NITRITE: NEGATIVE
PH: 5.5 (ref 5.0–8.0)
PROTEIN: NEGATIVE mg/dL
Specific Gravity, Urine: 1.01 (ref 1.005–1.030)
UROBILINOGEN UA: 0.2 mg/dL (ref 0.0–1.0)

## 2014-12-23 NOTE — Progress Notes (Signed)
Interpreter [employer]

## 2014-12-23 NOTE — Patient Instructions (Signed)

## 2014-12-23 NOTE — Progress Notes (Signed)
Patient ID: Betty Cain, female   DOB: 07-20-1975, 39 y.o.   MRN: [id number] History:  39 y.o. [id number] here today for eval of pelvic pain.  She reports that it is not new but has been present again for the last 3 days.  Pt reports that she does have pain with her menses and this is the same type of pain.  But, she also reports some assoc generalized joint aches.  Has not seen primary care physicain    The following portions of the patient's history were reviewed and updated as appropriate: allergies, current medications, past family history, past medical history, past social history, past surgical history and problem list.  Review of Systems:  Pertinent items are noted in HPI.  Objective:  Physical Exam Blood pressure 122/76, pulse 96, temperature 98.8 F (37.1 C), temperature source Oral, height 5' (1.524 m), weight 151 lb 6.4 oz (68.675 kg), last menstrual period 12/04/2014, currently breastfeeding. Gen: NAD Abd: Soft, nontender and nondistended. Well healed transverse incision. Pelvic: Normal appearing external genitalia; normal appearing vaginal mucosa and cervix.  Normal discharge.  Small uterus, no other palpable masses, no uterine or adnexal tenderness  Labs and Imaging No results found.  Assessment & Plan:  Pelvic pain with cycles- secondary dysmenorrhea- stable  Rec Naproxen that pt has already had prescribed.  She has not been taking it correctly. F/u with primary care    Betty Cain, M.D., Cherlynn June

## 2014-12-26 ENCOUNTER — Telehealth: Payer: Self-pay

## 2014-12-26 ENCOUNTER — Other Ambulatory Visit: Payer: Self-pay | Admitting: Family Medicine

## 2014-12-26 MED ORDER — NITROFURANTOIN MONOHYD MACRO 100 MG PO CAPS: 100.0000 mg | ORAL_CAPSULE | Freq: Two times a day (BID) | ORAL | Status: DC

## 2014-12-26 NOTE — Telephone Encounter (Signed)
-----   Message from Micheline Chapman, NP sent at 12/26/2014  8:28 AM EDT ----- Culture positive for infection. Will send in antibiotic. Nitrofurantoin 100 mg twice a day for 7 days.

## 2014-12-26 NOTE — Telephone Encounter (Signed)
Spoke with patient using Interpature with [employer] (ID [phone]) advised of infection and to take antibiotic as directed. Patient verbalized understanding. Thanks!

## 2014-12-30 ENCOUNTER — Encounter: Payer: Self-pay | Admitting: Family Medicine

## 2014-12-30 ENCOUNTER — Other Ambulatory Visit: Payer: Self-pay

## 2014-12-30 ENCOUNTER — Telehealth: Payer: Self-pay

## 2014-12-30 MED ORDER — NITROFURANTOIN MONOHYD MACRO 100 MG PO CAPS: 100.0000 mg | ORAL_CAPSULE | Freq: Two times a day (BID) | ORAL | Status: DC

## 2014-12-30 NOTE — Telephone Encounter (Signed)
Medication for Macrobid 100mg  re-sent to pharmacy. Thanks!

## 2014-12-30 NOTE — Telephone Encounter (Signed)
error 

## 2015-01-21 ENCOUNTER — Encounter: Payer: Self-pay | Admitting: Family Medicine

## 2015-01-21 ENCOUNTER — Ambulatory Visit (INDEPENDENT_AMBULATORY_CARE_PROVIDER_SITE_OTHER): Payer: No Typology Code available for payment source | Admitting: Family Medicine

## 2015-01-21 VITALS — BP 119/76 | HR 83 | Temp 99.5°F | Resp 16 | Ht <= 58 in | Wt 152.0 lb

## 2015-01-21 DIAGNOSIS — D649 Anemia, unspecified: Secondary | ICD-10-CM

## 2015-01-21 DIAGNOSIS — M25562 Pain in left knee: Secondary | ICD-10-CM

## 2015-01-21 DIAGNOSIS — Z789 Other specified health status: Secondary | ICD-10-CM

## 2015-01-21 DIAGNOSIS — K5900 Constipation, unspecified: Secondary | ICD-10-CM

## 2015-01-21 DIAGNOSIS — M25561 Pain in right knee: Secondary | ICD-10-CM

## 2015-01-21 DIAGNOSIS — H539 Unspecified visual disturbance: Secondary | ICD-10-CM

## 2015-01-21 DIAGNOSIS — Z0000 Encounter for general adult medical examination without abnormal findings: Secondary | ICD-10-CM

## 2015-01-21 DIAGNOSIS — R002 Palpitations: Secondary | ICD-10-CM

## 2015-01-21 DIAGNOSIS — R1084 Generalized abdominal pain: Secondary | ICD-10-CM

## 2015-01-21 LAB — COMPLETE METABOLIC PANEL WITH GFR
ALBUMIN: 4.4 g/dL (ref 3.6–5.1)
ALK PHOS: 74 U/L (ref 33–115)
ALT: 12 U/L (ref 6–29)
AST: 14 U/L (ref 10–30)
BUN: 9 mg/dL (ref 7–25)
CO2: 22 mmol/L (ref 20–31)
Calcium: 9.1 mg/dL (ref 8.6–10.2)
Chloride: 107 mmol/L (ref 98–110)
Creat: 0.5 mg/dL (ref 0.50–1.10)
GLUCOSE: 92 mg/dL (ref 65–99)
POTASSIUM: 3.9 mmol/L (ref 3.5–5.3)
SODIUM: 139 mmol/L (ref 135–146)
Total Bilirubin: 0.3 mg/dL (ref 0.2–1.2)
Total Protein: 7.1 g/dL (ref 6.1–8.1)

## 2015-01-21 MED ORDER — DOCUSATE SODIUM 100 MG PO CAPS: 100.0000 mg | ORAL_CAPSULE | Freq: Two times a day (BID) | ORAL | Status: DC

## 2015-01-21 MED ORDER — ACETAMINOPHEN 500 MG PO TABS: 500.0000 mg | ORAL_TABLET | Freq: Four times a day (QID) | ORAL | Status: DC | PRN

## 2015-01-21 NOTE — Patient Instructions (Addendum)
Heart palpitations, continue Metoprolol as previously prescribed Will send referral to cardiology for further evaluation Bilateral knee pain-elevate extremities to heart level while at rest, apply ice packs 20 minutes 4 times per day as needed, followed by warm, moist compresses. Start Tylenol 500 mg every 6 hours as needed for bilateral knee pain. Will notify patient with laboratory results Constipation: Colace 100 mg twice daily, add fiber to diet (increase fruits and veggies), drink 6-8 glasses of water per day,  Start exercise regimen. Anemia, Nonspecific Anemia is a condition in which the concentration of red blood cells or hemoglobin in the blood is below normal. Hemoglobin is a substance in red blood cells that carries oxygen to the tissues of the body. Anemia results in not enough oxygen reaching these tissues.  CAUSES  Common causes of anemia include:   Excessive bleeding. Bleeding may be internal or external. This includes excessive bleeding from periods (in women) or from the intestine.   Poor nutrition.   Chronic kidney, thyroid, and liver disease.  Bone marrow disorders that decrease red blood cell production.  Cancer and treatments for cancer.  HIV, AIDS, and their treatments.  Spleen problems that increase red blood cell destruction.  Blood disorders.  Excess destruction of red blood cells due to infection, medicines, and autoimmune disorders. SIGNS AND SYMPTOMS   Minor weakness.   Dizziness.   Headache.  Palpitations.   Shortness of breath, especially with exercise.   Paleness.  Cold sensitivity.  Indigestion.  Nausea.  Difficulty sleeping.  Difficulty concentrating. Symptoms may occur suddenly or they may develop slowly.  DIAGNOSIS  Additional blood tests are often needed. These help your health care provider determine the best treatment. Your health care provider will check your stool for blood and look for other causes of blood loss.   TREATMENT  Treatment varies depending on the cause of the anemia. Treatment can include:   Supplements of iron, vitamin G25, or folic acid.   Hormone medicines.   A blood transfusion. This may be needed if blood loss is severe.   Hospitalization. This may be needed if there is significant continual blood loss.   Dietary changes.  Spleen removal. HOME CARE INSTRUCTIONS Keep all follow-up appointments. It often takes many weeks to correct anemia, and having your health care provider check on your condition and your response to treatment is very important. SEEK IMMEDIATE MEDICAL CARE IF:   You develop extreme weakness, shortness of breath, or chest pain.   You become dizzy or have trouble concentrating.  You develop heavy vaginal bleeding.   You develop a rash.   You have bloody or black, tarry stools.   You faint.   You vomit up blood.   You vomit repeatedly.   You have abdominal pain.  You have a fever or persistent symptoms for more than 2-3 days.   You have a fever and your symptoms suddenly get worse.   You are dehydrated.  MAKE SURE YOU:  Understand these instructions.  Will watch your condition.  Will get help right away if you are not doing well or get worse.   This information is not intended to replace advice given to you by your health care provider. Make sure you discuss any questions you have with your health care provider.   Document Released: 05/06/2004 Document Revised: 11/29/2012 Document Reviewed: 09/22/2012 Elsevier Interactive Patient Education [employer]. Constipation, Adult Constipation is when a person has fewer than three bowel movements a week, has difficulty having a  bowel movement, or has stools that are dry, hard, or larger than normal. As people grow older, constipation is more common. A low-fiber diet, not taking in enough fluids, and taking certain medicines may make constipation worse.  CAUSES   Certain  medicines, such as antidepressants, pain medicine, iron supplements, antacids, and water pills.   Certain diseases, such as diabetes, irritable bowel syndrome (IBS), thyroid disease, or depression.   Not drinking enough water.   Not eating enough fiber-rich foods.   Stress or travel.   Lack of physical activity or exercise.   Ignoring the urge to have a bowel movement.   Using laxatives too much.  SIGNS AND SYMPTOMS   Having fewer than three bowel movements a week.   Straining to have a bowel movement.   Having stools that are hard, dry, or larger than normal.   Feeling full or bloated.   Pain in the lower abdomen.   Not feeling relief after having a bowel movement.  DIAGNOSIS  Your health care provider will take a medical history and perform a physical exam. Further testing may be done for severe constipation. Some tests may include:  A barium enema X-ray to examine your rectum, colon, and, sometimes, your small intestine.   A sigmoidoscopy to examine your lower colon.   A colonoscopy to examine your entire colon. TREATMENT  Treatment will depend on the severity of your constipation and what is causing it. Some dietary treatments include drinking more fluids and eating more fiber-rich foods. Lifestyle treatments may include regular exercise. If these diet and lifestyle recommendations do not help, your health care provider may recommend taking over-the-counter laxative medicines to help you have bowel movements. Prescription medicines may be prescribed if over-the-counter medicines do not work.  HOME CARE INSTRUCTIONS   Eat foods that have a lot of fiber, such as fruits, vegetables, whole grains, and beans.  Limit foods high in fat and processed sugars, such as french fries, hamburgers, cookies, candies, and soda.   A fiber supplement may be added to your diet if you cannot get enough fiber from foods.   Drink enough fluids to keep your urine clear or  pale yellow.   Exercise regularly or as directed by your health care provider.   Go to the restroom when you have the urge to go. Do not hold it.   Only take over-the-counter or prescription medicines as directed by your health care provider. Do not take other medicines for constipation without talking to your health care provider first.  [place] IF:   You have bright red blood in your stool.   Your constipation lasts for more than 4 days or gets worse.   You have abdominal or rectal pain.   You have thin, pencil-like stools.   You have unexplained weight loss. MAKE SURE YOU:   Understand these instructions.  Will watch your condition.  Will get help right away if you are not doing well or get worse.   This information is not intended to replace advice given to you by your health care provider. Make sure you discuss any questions you have with your health care provider.   Document Released: 12/26/2003 Document Revised: 04/19/2014 Document Reviewed: 01/08/2013 Elsevier Interactive Patient Education [employer].

## 2015-01-21 NOTE — Progress Notes (Signed)
Subjective:    Patient ID: Betty Cain, female    DOB: Oct 02, 1975, 39 y.o.   MRN: [id number]  HPI [name], a 39 year old female with a history of heart palpitations presents complaining of heart palpitations, knee pain, and visual disturbance. She is accompanied by interpreter being that she primarily speaks arabic. Patient states that she has been heart palpitations since last pregnancy. Patient was started on Metoprolol several months ago, which has improved symptoms minimally. She states that her heart beats uncontrollably while at rest. She states that she drinks tea with milk several times per day. She does not have any other sources of caffeine. Patient reports a history of anemia. She states that she has had blood in her underwear on several occasions. She denies rectal bleeding or heavy menses. [name] also complains of constipation. She states that she has 3-4 hard stools per week. She drinks 1-2 glasses of water per day. She denies fever, fatigue, anxiety, shortness of breath, nausea, vomiting, or diarrhea.   Patient is also complaining of bilateral knee pain. She states that pain started several months ago. She states that she has pain to the knees primarily when going up and down stairs. She maintains that current pain intensity is 3/10.  Pain is described as intermittent and aching. She reports that she has not attempted any over the counter interventions to alleviate current symptoms.    Past Medical History  Diagnosis Date  . Anemia   . Neonatal death     Vaginal delivery, full term-lived x2 hours.   Marland Kitchen Spinal headache   . IUD (intrauterine device) in place 09/03/2011    Due out in 07/2016  . Valvular heart disease   . Palpitations   . Shortness of breath    Immunization History  Administered Date(s) Administered  . Influenza,inj,Quad PF,36+ Mos 06/19/2013  . Tdap 09/13/2013, 12/11/2013  No Known Allergies  Social History   Social History  . Marital Status:  Married    Spouse Name: N/A  . Number of Children: N/A  . Years of Education: N/A   Occupational History  . Not on file.   Social History Main Topics  . Smoking status: Never Smoker   . Smokeless tobacco: Never Used  . Alcohol Use: No  . Drug Use: No  . Sexual Activity: Yes    Birth Control/ Protection: None     Comment: pregnant   Other Topics Concern  . Not on file   Social History Narrative   Review of Systems  Gastrointestinal: Positive for rectal pain.       Objective:   Physical Exam  Constitutional: She is oriented to person, place, and time. She appears well-developed and well-nourished.  HENT:  Head: Normocephalic and atraumatic.  Right Ear: Hearing, tympanic membrane and external ear normal.  Left Ear: Hearing, tympanic membrane and external ear normal.  Nose: Nose normal.  Mouth/Throat: Uvula is midline and oropharynx is clear and moist.  Eyes: Conjunctivae, EOM and lids are normal. Pupils are equal, round, and reactive to light. Right eye exhibits no discharge. Left eye exhibits no discharge. Pupils are equal.  Neck: Normal range of motion. Neck supple.  Cardiovascular: Normal rate, S1 normal, S2 normal, normal heart sounds, intact distal pulses and normal pulses.  An irregular rhythm present.  No murmur heard. Pulmonary/Chest: Effort normal.  Abdominal: Soft. Bowel sounds are normal. There is tenderness in the right upper quadrant and right lower quadrant. There is no rigidity, no rebound,  no guarding, no tenderness at McBurney's point and negative Murphy's sign.  Musculoskeletal:       Right knee: She exhibits decreased range of motion. She exhibits no swelling.       Left knee: She exhibits normal range of motion and no swelling.  Neurological: She is alert and oriented to person, place, and time. She has normal reflexes.  Skin: Skin is warm and dry.  Psychiatric: She has a normal mood and affect. Her behavior is normal. Judgment and thought content  normal.         BP 119/76 mmHg  Pulse 83  Temp(Src) 99.5 F (37.5 C) (Oral)  Resp 16  Ht 4\' 10"  (1.473 m)  Wt 152 lb (68.947 kg)  BMI 31.78 kg/m2  LMP 12/04/2014 Assessment & Plan:  1. Palpitations EKG is abnormal, has nonspecific t wave abnormalities. Patient warrants referral to cardiology. Reviewed past echocardiogram EF is 60-65%. Patient is to continue Metoprolol BID.  - EKG 12-Lead  2. Anemia, unspecified anemia type Reviewed previous labs, hemoglobin 11.5, will repeat CBCw/differential  3. Generalized abdominal pain  - CBC with Differential - COMPLETE METABOLIC PANEL WITH GFR - POCT urinalysis dipstick  4. Constipation, unspecified constipation type Patient to increased daily water intake to 6-8 glasses, increase dietary fiber, and physical activity.  - docusate sodium (COLACE) 100 MG capsule; Take 1 capsule (100 mg total) by mouth 2 (two) times daily.  Dispense: 14 capsule; Refill: 0  5. Knee pain, bilateral - acetaminophen (TYLENOL) 500 MG tablet; Take 1 tablet (500 mg total) by mouth every 6 (six) hours as needed.  Dispense: 30 tablet; Refill: 0  6. Visual disturbance Reviewed Snellen eye exam, vision is 20/20. Reminded to wear sunglasses in direct sunlight.   7. Routine health maintenance Reviewed previous labs. Will screen for diabetes Will return for immunizations - Hemoglobin A1c  8. Language barrier to communication Patient primarily speaks arabic, accompanied by interpreter to assist with communication.   RTC: as previously scheduled  The patient was given clear instructions to go to ER or return to medical center if symptoms do not improve, worsen or new problems develop. The patient verbalized understanding. Will notify patient with laboratory results. Dorena Dew, FNP

## 2015-01-22 LAB — CBC WITH DIFFERENTIAL/PLATELET
BASOS PCT: 1 % (ref 0–1)
Basophils Absolute: 0.1 10*3/uL (ref 0.0–0.1)
EOS ABS: 0.1 10*3/uL (ref 0.0–0.7)
Eosinophils Relative: 2 % (ref 0–5)
HCT: 35 % — ABNORMAL LOW (ref 36.0–46.0)
Hemoglobin: 11.8 g/dL — ABNORMAL LOW (ref 12.0–15.0)
Lymphocytes Relative: 43 % (ref 12–46)
Lymphs Abs: 2.8 10*3/uL (ref 0.7–4.0)
MCH: 27.1 pg (ref 26.0–34.0)
MCHC: 33.7 g/dL (ref 30.0–36.0)
MCV: 80.5 fL (ref 78.0–100.0)
MONO ABS: 0.2 10*3/uL (ref 0.1–1.0)
MONOS PCT: 3 % (ref 3–12)
MPV: 10.6 fL (ref 8.6–12.4)
Neutro Abs: 3.3 10*3/uL (ref 1.7–7.7)
Neutrophils Relative %: 51 % (ref 43–77)
PLATELETS: 386 10*3/uL (ref 150–400)
RBC: 4.35 MIL/uL (ref 3.87–5.11)
RDW: 13.8 % (ref 11.5–15.5)
WBC: 6.5 10*3/uL (ref 4.0–10.5)

## 2015-01-22 LAB — HEMOGLOBIN A1C
HEMOGLOBIN A1C: 5.8 % — AB (ref <5.7)
Mean Plasma Glucose: 120 mg/dL — ABNORMAL HIGH (ref <117)

## 2015-01-23 ENCOUNTER — Telehealth: Payer: Self-pay | Admitting: Family Medicine

## 2015-01-23 DIAGNOSIS — R7303 Prediabetes: Secondary | ICD-10-CM

## 2015-01-23 DIAGNOSIS — D649 Anemia, unspecified: Secondary | ICD-10-CM

## 2015-01-23 NOTE — Telephone Encounter (Signed)
Called using [employer] (ID# [phone]) Interpreter left message asking patient to call back regarding lab results. Thanks!

## 2015-01-23 NOTE — Telephone Encounter (Signed)
Reviewed labs. Hemoglobin A1C is 5.8%, goal is <5.7%. Recommend a lowfat, low carbohydrate diet divided over 5-6 small meals, increase water intake to 6-8 glasses, and 150 minutes per week of cardiovascular exercise.  Will re-check in 6 months.   Patient is mildly anemic. Recommend adding green, leafy veggies, organ meats, and increase protein sources.   Patient to follow up in 6 months or as needed.    Dorena Dew, FNP

## 2015-01-31 ENCOUNTER — Ambulatory Visit (INDEPENDENT_AMBULATORY_CARE_PROVIDER_SITE_OTHER): Payer: No Typology Code available for payment source | Admitting: Family Medicine

## 2015-01-31 VITALS — BP 121/68 | HR 75 | Temp 98.9°F | Resp 16 | Ht <= 58 in | Wt 152.0 lb

## 2015-01-31 DIAGNOSIS — Z91048 Other nonmedicinal substance allergy status: Secondary | ICD-10-CM

## 2015-01-31 DIAGNOSIS — Z9109 Other allergy status, other than to drugs and biological substances: Secondary | ICD-10-CM

## 2015-01-31 DIAGNOSIS — R110 Nausea: Secondary | ICD-10-CM

## 2015-01-31 DIAGNOSIS — Z789 Other specified health status: Secondary | ICD-10-CM

## 2015-01-31 DIAGNOSIS — R1084 Generalized abdominal pain: Secondary | ICD-10-CM

## 2015-01-31 LAB — POCT URINALYSIS DIP (DEVICE)
Bilirubin Urine: NEGATIVE
Glucose, UA: NEGATIVE mg/dL
Ketones, ur: NEGATIVE mg/dL
LEUKOCYTES UA: NEGATIVE
NITRITE: NEGATIVE
PROTEIN: 30 mg/dL — AB
SPECIFIC GRAVITY, URINE: 1.025 (ref 1.005–1.030)
UROBILINOGEN UA: 0.2 mg/dL (ref 0.0–1.0)
pH: 7 (ref 5.0–8.0)

## 2015-01-31 LAB — POCT URINE PREGNANCY: PREG TEST UR: NEGATIVE

## 2015-01-31 MED ORDER — ONDANSETRON HCL 4 MG PO TABS: 4.0000 mg | ORAL_TABLET | Freq: Three times a day (TID) | ORAL | Status: DC | PRN

## 2015-01-31 MED ORDER — LORATADINE 10 MG PO TABS: 10.0000 mg | ORAL_TABLET | Freq: Every day | ORAL | Status: DC

## 2015-01-31 NOTE — Progress Notes (Signed)
Subjective:    Patient ID: Betty Cain, female    DOB: [birth year], 39 y.o.   MRN: [id number] [name], a 39 year old female with a history of heart palpitations presents complaining of abdominal discomfort and periodic swelling to legs and face. She is accompanied by interpreter being that she primarily speaks arabic.[name] states that she has been having nausea and soft stools over the past 24 hours. Patient is the only one in house hold with current symptoms. She denies fever, fatigue, or vomiting.  Abdominal Pain This is a new problem. The current episode started yesterday. The onset quality is sudden. The problem occurs intermittently. The problem has been gradually improving. The pain is located in the generalized abdominal region. The pain is at a severity of 2/10. The pain is mild. The quality of the pain is cramping. The abdominal pain does not radiate. Associated symptoms include diarrhea (4-5 soft stools) and nausea. Pertinent negatives include no anorexia, arthralgias, belching, constipation, dysuria, fever, flatus, frequency, headaches, hematochezia, hematuria, melena, myalgias, vomiting or weight loss. The pain is aggravated by eating. The pain is relieved by nothing. She has tried nothing for the symptoms.    Past Medical History  Diagnosis Date  . Anemia   . Neonatal death     Vaginal delivery, full term-lived x2 hours.   Marland Kitchen Spinal headache   . IUD (intrauterine device) in place 09/03/2011    Due out in 07/2016  . Valvular heart disease   . Palpitations   . Shortness of breath    Immunization History  Administered Date(s) Administered  . Influenza,inj,Quad PF,36+ Mos 06/19/2013  . Tdap 09/13/2013, 12/11/2013  No Known Allergies  Social History   Social History  . Marital Status: Married    Spouse Name: N/A  . Number of Children: N/A  . Years of Education: N/A   Occupational History  . Not on file.   Social History Main Topics  . Smoking status: Never  Smoker   . Smokeless tobacco: Never Used  . Alcohol Use: No  . Drug Use: No  . Sexual Activity: Yes    Birth Control/ Protection: None     Comment: pregnant   Other Topics Concern  . Not on file   Social History Narrative   Review of Systems  Constitutional: Negative for fever and weight loss.  Eyes: Negative.   Gastrointestinal: Positive for nausea, abdominal pain and diarrhea (4-5 soft stools). Negative for vomiting, constipation, melena, hematochezia, anorexia and flatus.  Endocrine: Negative.  Negative for polydipsia, polyphagia and polyuria.  Genitourinary: Negative for dysuria, frequency and hematuria.  Musculoskeletal: Negative for myalgias and arthralgias.  Skin: Negative.   Allergic/Immunologic: Positive for environmental allergies.  Neurological: Negative for headaches.  Hematological: Negative.   Psychiatric/Behavioral: Negative.        Objective:   Physical Exam  Constitutional: She is oriented to person, place, and time. She appears well-developed and well-nourished.  HENT:  Head: Normocephalic and atraumatic.  Right Ear: Hearing, tympanic membrane and external ear normal.  Left Ear: Hearing, tympanic membrane and external ear normal.  Nose: Nose normal.  Mouth/Throat: Uvula is midline and oropharynx is clear and moist.  Eyes: Conjunctivae, EOM and lids are normal. Pupils are equal, round, and reactive to light. Right eye exhibits no discharge. Left eye exhibits no discharge. Pupils are equal.  Neck: Normal range of motion. Neck supple.  Cardiovascular: Normal rate, S1 normal, S2 normal, normal heart sounds, intact distal pulses and normal pulses.  An irregular rhythm present.  No murmur heard. Pulmonary/Chest: Effort normal and breath sounds normal.  Abdominal: Soft. Bowel sounds are normal. There is tenderness in the right upper quadrant and right lower quadrant. There is no rigidity, no rebound, no guarding, no tenderness at McBurney's point and negative  Murphy's sign.  Neurological: She is alert and oriented to person, place, and time. She has normal reflexes.  Skin: Skin is warm and dry.  Psychiatric: She has a normal mood and affect. Her behavior is normal. Judgment and thought content normal.     BP 121/68 mmHg  Pulse 75  Temp(Src) 98.9 F (37.2 C) (Oral)  Resp 16  Ht 4\' 10"  (1.473 m)  Wt 152 lb (68.947 kg)  BMI 31.78 kg/m2  LMP 12/05/2014 Assessment & Plan:  1. Generalized abdominal pain Patient has experienced 4-5 soft stools with abdominal discomfort over the past 24 hours. Recommend starting a clear liquid diet over the next 12 hours. Afterwards, introduce bland, soft foods. Discussed at length, patient expressed understanding. Ruled out pregnancy and/or UTI.  - POCT urinalysis dipstick - POCT urine pregnancy - POCT urinalysis dip (device)  2. Nausea Patient is experiencing periodic nausea, will add Zofran.  - ondansetron (ZOFRAN) 4 MG tablet; Take 1 tablet (4 mg total) by mouth every 8 (eight) hours as needed for nausea or vomiting.  Dispense: 20 tablet; Refill: 0  3. Environmental allergies - loratadine (CLARITIN) 10 MG tablet; Take 1 tablet (10 mg total) by mouth daily.  Dispense: 30 tablet; Refill: 5  4. Language Barrier to Actuary required to assist with communication. Patient primarily speaks arabic.   RTC: As previously scheduled Dorena Dew, FNP

## 2015-01-31 NOTE — Patient Instructions (Addendum)
Recommend clear liquid diet over the next 12 hours, then slowly transition to a bland, soft diet.   Nausea, Adult Nausea is the feeling that you have an upset stomach or have to vomit. Nausea by itself is not likely a serious concern, but it may be an early sign of more serious medical problems. As nausea gets worse, it can lead to vomiting. If vomiting develops, there is the risk of dehydration.  CAUSES   Viral infections.  Food poisoning.  Medicines.  Pregnancy.  Motion sickness.  Migraine headaches.  Emotional distress.  Severe pain from any source.  Alcohol intoxication. HOME CARE INSTRUCTIONS  Get plenty of rest.  Ask your caregiver about specific rehydration instructions.  Eat small amounts of food and sip liquids more often.  Take all medicines as told by your caregiver. SEEK MEDICAL CARE IF:  You have not improved after 2 days, or you get worse.  You have a headache. SEEK IMMEDIATE MEDICAL CARE IF:   You have a fever.  You faint.  You keep vomiting or have blood in your vomit.  You are extremely weak or dehydrated.  You have dark or bloody stools.  You have severe chest or abdominal pain. MAKE SURE YOU:  Understand these instructions.  Will watch your condition.  Will get help right away if you are not doing well or get worse.   This information is not intended to replace advice given to you by your health care provider. Make sure you discuss any questions you have with your health care provider.   Document Released: 05/06/2004 Document Revised: 04/19/2014 Document Reviewed: 12/09/2010 Elsevier Interactive Patient Education [employer].

## 2015-02-01 ENCOUNTER — Encounter: Payer: Self-pay | Admitting: Family Medicine

## 2015-02-01 DIAGNOSIS — R109 Unspecified abdominal pain: Secondary | ICD-10-CM | POA: Insufficient documentation

## 2015-02-01 DIAGNOSIS — Z789 Other specified health status: Secondary | ICD-10-CM | POA: Insufficient documentation

## 2015-02-01 DIAGNOSIS — R110 Nausea: Secondary | ICD-10-CM | POA: Insufficient documentation

## 2015-02-01 DIAGNOSIS — R1084 Generalized abdominal pain: Secondary | ICD-10-CM | POA: Insufficient documentation

## 2015-02-01 DIAGNOSIS — Z9109 Other allergy status, other than to drugs and biological substances: Secondary | ICD-10-CM | POA: Insufficient documentation

## 2015-02-14 ENCOUNTER — Ambulatory Visit (INDEPENDENT_AMBULATORY_CARE_PROVIDER_SITE_OTHER): Payer: No Typology Code available for payment source | Admitting: Family Medicine

## 2015-02-14 VITALS — BP 118/62 | HR 74 | Temp 98.5°F | Resp 16 | Ht <= 58 in | Wt 151.0 lb

## 2015-02-14 DIAGNOSIS — M545 Low back pain, unspecified: Secondary | ICD-10-CM | POA: Insufficient documentation

## 2015-02-14 DIAGNOSIS — K5900 Constipation, unspecified: Secondary | ICD-10-CM

## 2015-02-14 DIAGNOSIS — R109 Unspecified abdominal pain: Secondary | ICD-10-CM | POA: Insufficient documentation

## 2015-02-14 DIAGNOSIS — Z789 Other specified health status: Secondary | ICD-10-CM

## 2015-02-14 LAB — POCT URINALYSIS DIP (DEVICE)
BILIRUBIN URINE: NEGATIVE
Glucose, UA: NEGATIVE mg/dL
KETONES UR: NEGATIVE mg/dL
LEUKOCYTES UA: NEGATIVE
NITRITE: NEGATIVE
PH: 6 (ref 5.0–8.0)
Protein, ur: NEGATIVE mg/dL
Specific Gravity, Urine: 1.01 (ref 1.005–1.030)
Urobilinogen, UA: 0.2 mg/dL (ref 0.0–1.0)

## 2015-02-14 MED ORDER — LUBIPROSTONE 8 MCG PO CAPS: 8.0000 ug | ORAL_CAPSULE | Freq: Two times a day (BID) | ORAL | Status: DC

## 2015-02-14 NOTE — Patient Instructions (Signed)
Will start a 1 month trial of amitiza 8 mcg twice daily with a meal. Increase water intake to 6-8 glasses.  Constipation, Adult Constipation is when a person has fewer than three bowel movements a week, has difficulty having a bowel movement, or has stools that are dry, hard, or larger than normal. As people grow older, constipation is more common. A low-fiber diet, not taking in enough fluids, and taking certain medicines may make constipation worse.  CAUSES   Certain medicines, such as antidepressants, pain medicine, iron supplements, antacids, and water pills.   Certain diseases, such as diabetes, irritable bowel syndrome (IBS), thyroid disease, or depression.   Not drinking enough water.   Not eating enough fiber-rich foods.   Stress or travel.   Lack of physical activity or exercise.   Ignoring the urge to have a bowel movement.   Using laxatives too much.  SIGNS AND SYMPTOMS   Having fewer than three bowel movements a week.   Straining to have a bowel movement.   Having stools that are hard, dry, or larger than normal.   Feeling full or bloated.   Pain in the lower abdomen.   Not feeling relief after having a bowel movement.  DIAGNOSIS  Your health care provider will take a medical history and perform a physical exam. Further testing may be done for severe constipation. Some tests may include:  A barium enema X-ray to examine your rectum, colon, and, sometimes, your small intestine.   A sigmoidoscopy to examine your lower colon.   A colonoscopy to examine your entire colon. TREATMENT  Treatment will depend on the severity of your constipation and what is causing it. Some dietary treatments include drinking more fluids and eating more fiber-rich foods. Lifestyle treatments may include regular exercise. If these diet and lifestyle recommendations do not help, your health care provider may recommend taking over-the-counter laxative medicines to help you  have bowel movements. Prescription medicines may be prescribed if over-the-counter medicines do not work.  HOME CARE INSTRUCTIONS   Eat foods that have a lot of fiber, such as fruits, vegetables, whole grains, and beans.  Limit foods high in fat and processed sugars, such as french fries, hamburgers, cookies, candies, and soda.   A fiber supplement may be added to your diet if you cannot get enough fiber from foods.   Drink enough fluids to keep your urine clear or pale yellow.   Exercise regularly or as directed by your health care provider.   Go to the restroom when you have the urge to go. Do not hold it.   Only take over-the-counter or prescription medicines as directed by your health care provider. Do not take other medicines for constipation without talking to your health care provider first.  [place] IF:   You have bright red blood in your stool.   Your constipation lasts for more than 4 days or gets worse.   You have abdominal or rectal pain.   You have thin, pencil-like stools.   You have unexplained weight loss. MAKE SURE YOU:   Understand these instructions.  Will watch your condition.  Will get help right away if you are not doing well or get worse.   This information is not intended to replace advice given to you by your health care provider. Make sure you discuss any questions you have with your health care provider.   Document Released: 12/26/2003 Document Revised: 04/19/2014 Document Reviewed: 01/08/2013 Elsevier Interactive Patient Education 2016 Elsevier  Inc.  

## 2015-02-14 NOTE — Progress Notes (Signed)
Subjective:    Patient ID: Betty Cain, female    DOB: 19-Dec-1975, 39 y.o.   MRN: [id number] [name], a 39 year old female with a history of hemorrhoids presents complaining of constipation and abdominal discomfort. She is accompanied by interpreter being that she primarily speaks arabic.[name] states that she has been having nausea and soft stools over the past 24 hours. Patient is the only one in house hold with current symptoms. She denies fever, fatigue, or vomiting. Constipation This is a recurrent problem. The current episode started in the past 7 days. The problem is unchanged. Her stool frequency is 1 time per week or less. The stool is described as firm. The patient is not on a high fiber diet. She does not exercise regularly. Associated symptoms include abdominal pain. She has tried diet changes and stool softeners for the symptoms.  Abdominal Pain This is a recurrent problem. The current episode started 1 to 4 weeks ago. The onset quality is gradual. The problem occurs intermittently. The pain is at a severity of 2/10. The pain is mild. The quality of the pain is aching. The abdominal pain does not radiate. Associated symptoms include constipation. Pertinent negatives include no myalgias. Nothing aggravates the pain. She has tried nothing for the symptoms. There is no history of GERD.    Past Medical History  Diagnosis Date  . Anemia   . Neonatal death     Vaginal delivery, full term-lived x2 hours.   Marland Kitchen Spinal headache   . IUD (intrauterine device) in place 09/03/2011    Due out in 07/2016  . Valvular heart disease   . Palpitations   . Shortness of breath    Immunization History  Administered Date(s) Administered  . Influenza,inj,Quad PF,36+ Mos 06/19/2013  . Tdap 09/13/2013, 12/11/2013  No Known Allergies  Social History   Social History  . Marital Status: Married    Spouse Name: N/A  . Number of Children: N/A  . Years of Education: N/A   Occupational  History  . Not on file.   Social History Main Topics  . Smoking status: Never Smoker   . Smokeless tobacco: Never Used  . Alcohol Use: No  . Drug Use: No  . Sexual Activity: Yes    Birth Control/ Protection: None     Comment: pregnant   Other Topics Concern  . Not on file   Social History Narrative   Review of Systems  Eyes: Negative.   Cardiovascular: Negative.   Gastrointestinal: Positive for abdominal pain and constipation.  Endocrine: Negative.  Negative for polydipsia, polyphagia and polyuria.  Musculoskeletal: Negative.  Negative for myalgias.  Skin: Negative.   Allergic/Immunologic: Positive for environmental allergies.  Hematological: Negative.   Psychiatric/Behavioral: Negative.        Objective:   Physical Exam  Constitutional: She is oriented to person, place, and time. She appears well-developed and well-nourished.  HENT:  Head: Normocephalic and atraumatic.  Right Ear: Hearing, tympanic membrane and external ear normal.  Left Ear: Hearing, tympanic membrane and external ear normal.  Nose: Nose normal.  Mouth/Throat: Uvula is midline and oropharynx is clear and moist.  Eyes: Conjunctivae, EOM and lids are normal. Pupils are equal, round, and reactive to light. Right eye exhibits no discharge. Left eye exhibits no discharge. Pupils are equal.  Neck: Normal range of motion. Neck supple.  Cardiovascular: Normal rate, regular rhythm, S1 normal, S2 normal, normal heart sounds, intact distal pulses and normal pulses.   No murmur  heard. Pulmonary/Chest: Effort normal and breath sounds normal.  Abdominal: Soft. Bowel sounds are normal. There is tenderness in the right upper quadrant and right lower quadrant. There is no rigidity, no rebound, no guarding, no tenderness at McBurney's point and negative Murphy's sign.  Neurological: She is alert and oriented to person, place, and time. She has normal reflexes.  Skin: Skin is warm and dry.  Psychiatric: She has a normal  mood and affect. Her behavior is normal. Judgment and thought content normal.     BP 118/62 mmHg  Pulse 74  Temp(Src) 98.5 F (36.9 C) (Oral)  Resp 16  Ht 4\' 10"  (1.473 m)  Wt 151 lb (68.493 kg)  BMI 31.57 kg/m2  LMP 12/05/2014 Assessment & Plan:   1. Constipation, unspecified constipation type I suspect that [name] has a history of constipation and hemorrhoids. She has not been taking Colace consistently or eating a high fiber diet. I suspect irritable bowel syndrome with constipation and bloating. Will start a trial of Amitiza, low dose. Discussed diet at length.  - lubiprostone (AMITIZA) 8 MCG capsule; Take 1 capsule (8 mcg total) by mouth 2 (two) times daily with a meal.  Dispense: 60 capsule; Refill: 0  2. Abdominal discomfort Reviewed urinalysis, consistent for previous.  - POCT urinalysis dip (device)  3. Low back pain without sciatica, unspecified back pain laterality Reminded [name] to continue Tylenol 500 mg every 6 hours as needed. Recommend back stretches. Apply ice pack to lower back for 20 minutes 4 times per day as needed followed by warm, moist compresses.   4. Language Barrier to Actuary required to assist with communication. Patient primarily speaks arabic.   RTC: 1 month  The patient was given clear instructions to go to ER or return to medical center if symptoms do not improve, worsen or new problems develop. The patient verbalized understanding. Will notify patient with laboratory results.      Dorena Dew, FNP

## 2015-02-17 ENCOUNTER — Encounter: Payer: Self-pay | Admitting: Family Medicine

## 2015-03-27 ENCOUNTER — Ambulatory Visit (INDEPENDENT_AMBULATORY_CARE_PROVIDER_SITE_OTHER): Payer: No Typology Code available for payment source | Admitting: Family Medicine

## 2015-03-27 VITALS — BP 120/71 | HR 78 | Temp 98.4°F | Resp 14 | Ht <= 58 in | Wt 150.0 lb

## 2015-03-27 DIAGNOSIS — M545 Low back pain: Secondary | ICD-10-CM

## 2015-03-27 DIAGNOSIS — R05 Cough: Secondary | ICD-10-CM

## 2015-03-27 DIAGNOSIS — I471 Supraventricular tachycardia: Secondary | ICD-10-CM

## 2015-03-27 DIAGNOSIS — Z789 Other specified health status: Secondary | ICD-10-CM

## 2015-03-27 DIAGNOSIS — K5900 Constipation, unspecified: Secondary | ICD-10-CM

## 2015-03-27 DIAGNOSIS — R053 Chronic cough: Secondary | ICD-10-CM

## 2015-03-27 LAB — POCT URINALYSIS DIP (DEVICE)
BILIRUBIN URINE: NEGATIVE
GLUCOSE, UA: NEGATIVE mg/dL
KETONES UR: NEGATIVE mg/dL
Leukocytes, UA: NEGATIVE
Nitrite: NEGATIVE
Protein, ur: 30 mg/dL — AB
SPECIFIC GRAVITY, URINE: 1.015 (ref 1.005–1.030)
Urobilinogen, UA: 0.2 mg/dL (ref 0.0–1.0)
pH: 5.5 (ref 5.0–8.0)

## 2015-03-27 MED ORDER — LUBIPROSTONE 8 MCG PO CAPS: 8.0000 ug | ORAL_CAPSULE | Freq: Two times a day (BID) | ORAL | Status: DC

## 2015-03-27 MED ORDER — METOPROLOL TARTRATE 25 MG PO TABS: 12.5000 mg | ORAL_TABLET | Freq: Two times a day (BID) | ORAL | Status: DC

## 2015-03-27 MED ORDER — GUAIFENESIN 100 MG/5ML PO SOLN: 5.0000 mL | Freq: Four times a day (QID) | ORAL | Status: DC | PRN

## 2015-03-27 MED ORDER — LUBIPROSTONE 8 MCG PO CAPS: 8.0000 ug | ORAL_CAPSULE | Freq: Every day | ORAL | Status: DC

## 2015-03-27 MED ORDER — KETOROLAC TROMETHAMINE 60 MG/2ML IM SOLN
30.0000 mg | Freq: Once | INTRAMUSCULAR | Status: AC
  Administered 2015-03-27: 30 mg via INTRAMUSCULAR

## 2015-03-27 NOTE — Progress Notes (Signed)
Subjective:    Patient ID: Betty Cain, female    DOB: 10/08/1975, 39 y.o.   MRN: [id number]    Abdominal Pain This is a recurrent problem. The current episode started more than 1 month ago. The onset quality is gradual. The problem occurs intermittently. The problem has been gradually improving. The pain is at a severity of 2/10. The pain is mild. The quality of the pain is aching. The abdominal pain does not radiate. Pertinent negatives include no dysuria, fever, headaches or weight loss. Nothing aggravates the pain. She has tried nothing for the symptoms. The treatment provided moderate (Patient has had moderate improvement on Amitiza, she ran out of medication prior to appointment) relief. There is no history of colon cancer, gallstones, GERD, pancreatitis, PUD or ulcerative colitis.  Back Pain The current episode started more than 1 month ago. The problem occurs 2 to 4 times per day. The problem is unchanged. The pain is present in the lumbar spine. The quality of the pain is described as aching. The pain does not radiate. The pain is at a severity of 4/10. The pain is mild. The pain is the same all the time. The symptoms are aggravated by bending, position and standing. Pertinent negatives include no bladder incontinence, bowel incontinence, chest pain, dysuria, fever, headaches, leg pain, numbness, pelvic pain, perianal numbness, weakness or weight loss. Risk factors include obesity, sedentary lifestyle and poor posture. She has tried analgesics for the symptoms. The treatment provided no relief (Patient has not been consistent with recommended interventions).  Cough This is a new problem. The current episode started in the past 7 days. The problem has been unchanged. The cough is productive of sputum. Pertinent negatives include no chest pain, chills, ear congestion, ear pain, fever, headaches, postnasal drip, weight loss or wheezing. Nothing aggravates the symptoms. She has tried nothing for  the symptoms. Her past medical history is significant for environmental allergies. There is no history of asthma, bronchiectasis or bronchitis.    Past Medical History  Diagnosis Date  . Anemia   . Neonatal death     Vaginal delivery, full term-lived x2 hours.   Marland Kitchen Spinal headache   . IUD (intrauterine device) in place 09/03/2011    Due out in 07/2016  . Valvular heart disease   . Palpitations   . Shortness of breath    Immunization History  Administered Date(s) Administered  . Influenza,inj,Quad PF,36+ Mos 06/19/2013  . Tdap 09/13/2013, 12/11/2013  No Known Allergies  Social History   Social History  . Marital Status: Married    Spouse Name: N/A  . Number of Children: N/A  . Years of Education: N/A   Occupational History  . Not on file.   Social History Main Topics  . Smoking status: Never Smoker   . Smokeless tobacco: Never Used  . Alcohol Use: No  . Drug Use: No  . Sexual Activity: Yes    Birth Control/ Protection: None     Comment: pregnant   Other Topics Concern  . Not on file   Social History Narrative   Review of Systems  Constitutional: Negative for fever, chills and weight loss.  HENT: Negative for ear pain and postnasal drip.   Respiratory: Positive for cough. Negative for wheezing.   Cardiovascular: Negative.  Negative for chest pain.  Gastrointestinal: Negative for bowel incontinence.  Endocrine: Negative.  Negative for polydipsia, polyphagia and polyuria.  Genitourinary: Negative for bladder incontinence, dysuria and pelvic pain.  Musculoskeletal: Positive for back  pain.  Skin: Negative.   Allergic/Immunologic: Positive for environmental allergies.  Neurological: Negative for weakness, numbness and headaches.  Hematological: Negative.   Psychiatric/Behavioral: Negative.        Objective:   Physical Exam  Constitutional: She is oriented to person, place, and time. She appears well-developed and well-nourished.  HENT:  Head: Normocephalic and  atraumatic.  Right Ear: Hearing, tympanic membrane and external ear normal.  Left Ear: Hearing, tympanic membrane and external ear normal.  Nose: Nose normal.  Mouth/Throat: Uvula is midline and oropharynx is clear and moist.  Eyes: Conjunctivae, EOM and lids are normal. Pupils are equal, round, and reactive to light. Right eye exhibits no discharge. Left eye exhibits no discharge. Pupils are equal.  Neck: Normal range of motion. Neck supple.  Cardiovascular: Normal rate, regular rhythm, S1 normal, S2 normal, normal heart sounds, intact distal pulses and normal pulses.   No murmur heard. Pulmonary/Chest: Effort normal and breath sounds normal.  Abdominal: Soft. Bowel sounds are normal. There is tenderness in the right upper quadrant and right lower quadrant. There is no rigidity, no rebound, no guarding, no tenderness at McBurney's point and negative Murphy's sign.  Musculoskeletal:       Lumbar back: She exhibits decreased range of motion and pain. She exhibits no swelling and no spasm.  Neurological: She is alert and oriented to person, place, and time. She has normal reflexes.  Skin: Skin is warm and dry.  Psychiatric: She has a normal mood and affect. Her behavior is normal. Judgment and thought content normal.     BP 120/71 mmHg  Pulse 78  Temp(Src) 98.4 F (36.9 C) (Oral)  Resp 14  Ht 4\' 10"  (1.473 m)  Wt 150 lb (68.04 kg)  BMI 31.36 kg/m2  SpO2 100%  LMP 03/18/2015 Assessment & Plan:  1. Low back pain without sciatica, unspecified back pain laterality Patient has not attempted Tylenol 500 mg every 6 hours as previously prescribed. Recommend warm, moist compresses to lower back as needed. Patient was given written information on back stretches during previous appointment, she did not comply with recommendations. Explained that symptoms will not improve if she does not comply with treatment recommendations.  - ketorolac (TORADOL) injection 30 mg; Inject 1 mL (30 mg total) into the  muscle once. - POCT urinalysis dipstick  2. Cough, persistent Patient is complaining of a cough for the past week. She states that her children have had a similar cough. I suspect that the cough is viral. Recommend increasing fluid intake, vitamin C intake and handwashing.  - guaiFENesin (ROBITUSSIN) 100 MG/5ML SOLN; Take 5 mLs (100 mg total) by mouth every 6 (six) hours as needed for cough or to loosen phlegm.  Dispense: 1200 mL; Refill: 0  3. Constipation, unspecified constipation type Will continue Amitiza as previously scheduled. Will follow up in 2 months.  - lubiprostone (AMITIZA) 8 MCG capsule; Take 1 capsule (8 mcg total) by mouth daily with breakfast.  Dispense: 30 capsule; Refill: 1  4. Paroxysmal supraventricular tachycardia ([place]) Will continue metoprolol at a lower dose, 12.5 BID. - metoprolol tartrate (LOPRESSOR) 25 MG tablet; Take 0.5 tablets (12.5 mg total) by mouth 2 (two) times daily.  Dispense: 60 tablet; Refill: 2   5. Language Barrier to Actuary required to assist with communication. Patient primarily speaks arabic.   RTC: 1 month  The patient was given clear instructions to go to ER or return to medical center if symptoms do not improve, worsen or new problems develop.  The patient verbalized understanding. Will notify patient with laboratory results.      Dorena Dew, FNP

## 2015-03-27 NOTE — Patient Instructions (Addendum)
Recommend a lowfat, low carbohydrate diet divided over 5-6 small meals, increase water intake to 6-8 glasses, and 150 minutes per week of cardiovascular exercise.    Back Pain, Adult Back pain is very common in adults.The cause of back pain is rarely dangerous and the pain often gets better over time.The cause of your back pain may not be known. Some common causes of back pain include:  Strain of the muscles or ligaments supporting the spine.  Wear and tear (degeneration) of the spinal disks.  Arthritis.  Direct injury to the back. For many people, back pain may return. Since back pain is rarely dangerous, most people can learn to manage this condition on their own. HOME CARE INSTRUCTIONS Watch your back pain for any changes. The following actions may help to lessen any discomfort you are feeling:  Remain active. It is stressful on your back to sit or stand in one place for long periods of time. Do not sit, drive, or stand in one place for more than 30 minutes at a time. Take short walks on even surfaces as soon as you are able.Try to increase the length of time you walk each day.  Exercise regularly as directed by your health care provider. Exercise helps your back heal faster. It also helps avoid future injury by keeping your muscles strong and flexible.  Do not stay in bed.Resting more than 1-2 days can delay your recovery.  Pay attention to your body when you bend and lift. The most comfortable positions are those that put less stress on your recovering back. Always use proper lifting techniques, including:  Bending your knees.  Keeping the load close to your body.  Avoiding twisting.  Find a comfortable position to sleep. Use a firm mattress and lie on your side with your knees slightly bent. If you lie on your back, put a pillow under your knees.  Avoid feeling anxious or stressed.Stress increases muscle tension and can worsen back pain.It is important to recognize when  you are anxious or stressed and learn ways to manage it, such as with exercise.  Take medicines only as directed by your health care provider. Over-the-counter medicines to reduce pain and inflammation are often the most helpful.Your health care provider may prescribe muscle relaxant drugs.These medicines help dull your pain so you can more quickly return to your normal activities and healthy exercise.  Apply ice to the injured area:  Put ice in a plastic bag.  Place a towel between your skin and the bag.  Leave the ice on for 20 minutes, 2-3 times a day for the first 2-3 days. After that, ice and heat may be alternated to reduce pain and spasms.  Maintain a healthy weight. Excess weight puts extra stress on your back and makes it difficult to maintain good posture. SEEK MEDICAL CARE IF:  You have pain that is not relieved with rest or medicine.  You have increasing pain going down into the legs or buttocks.  You have pain that does not improve in one week.  You have night pain.  You lose weight.  You have a fever or chills. SEEK IMMEDIATE MEDICAL CARE IF:   You develop new bowel or bladder control problems.  You have unusual weakness or numbness in your arms or legs.  You develop nausea or vomiting.  You develop abdominal pain.  You feel faint.   This information is not intended to replace advice given to you by your health care provider. Make sure  you discuss any questions you have with your health care provider.   Document Released: 03/29/2005 Document Revised: 04/19/2014 Document Reviewed: 07/31/2013 Elsevier Interactive Patient Education 2016 [employer]. Exercising to [employer] Exercising can help you to lose weight. In order to lose weight through exercise, you need to do vigorous-intensity exercise. You can tell that you are exercising with vigorous intensity if you are breathing very hard and fast and cannot hold a conversation while  exercising. Moderate-intensity exercise helps to maintain your current weight. You can tell that you are exercising at a moderate level if you have a higher heart rate and faster breathing, but you are still able to hold a conversation. HOW OFTEN SHOULD I EXERCISE? Choose an activity that you enjoy and set realistic goals. Your health care provider can help you to make an activity plan that works for you. Exercise regularly as directed by your health care provider. This may include:  Doing resistance training twice each week, such as:  Push-ups.  Sit-ups.  Lifting weights.  Using resistance bands.  Doing a given intensity of exercise for a given amount of time. Choose from these options:  150 minutes of moderate-intensity exercise every week.  75 minutes of vigorous-intensity exercise every week.  A mix of moderate-intensity and vigorous-intensity exercise every week. Children, pregnant women, people who are out of shape, people who are overweight, and older adults may need to consult a health care provider for individual recommendations. If you have any sort of medical condition, be sure to consult your health care provider before starting a new exercise program. WHAT ARE SOME ACTIVITIES THAT CAN HELP ME TO LOSE WEIGHT?   Walking at a rate of at least 4.5 miles an hour.  Jogging or running at a rate of 5 miles per hour.  Biking at a rate of at least 10 miles per hour.  Lap swimming.  Roller-skating or in-line skating.  Cross-country skiing.  Vigorous competitive sports, such as football, basketball, and soccer.  Jumping rope.  Aerobic dancing. HOW CAN I BE MORE ACTIVE IN MY DAY-TO-DAY ACTIVITIES?  Use the stairs instead of the elevator.  Take a walk during your lunch break.  If you drive, park your car farther away from work or school.  If you take public transportation, get off one stop early and walk the rest of the way.  Make all of your phone calls while  standing up and walking around.  Get up, stretch, and walk around every 30 minutes throughout the day. WHAT GUIDELINES SHOULD I FOLLOW WHILE EXERCISING?  Do not exercise so much that you hurt yourself, feel dizzy, or get very short of breath.  Consult your health care provider prior to starting a new exercise program.  Wear comfortable clothes and shoes with good support.  Drink plenty of water while you exercise to prevent dehydration or heat stroke. Body water is lost during exercise and must be replaced.  Work out until you breathe faster and your heart beats faster.   This information is not intended to replace advice given to you by your health care provider. Make sure you discuss any questions you have with your health care provider.   Document Released: 05/01/2010 Document Revised: 04/19/2014 Document Reviewed: 08/30/2013 Elsevier Interactive Patient Education 2016 [place] [employer] Loss Calories are energy you get from the things you eat and drink. Your body uses this energy to keep you going throughout the day. The number of calories you eat affects your  weight. When you eat more calories than your body needs, your body stores the extra calories as fat. When you eat fewer calories than your body needs, your body burns fat to get the energy it needs. Calorie counting means keeping track of how many calories you eat and drink each day. If you make sure to eat fewer calories than your body needs, you should lose weight. In order for calorie counting to work, you will need to eat the number of calories that are right for you in a day to lose a healthy amount of weight per week. A healthy amount of weight to lose per week is usually 1-2 lb (0.5-0.9 kg). A dietitian can determine how many calories you need in a day and give you suggestions on how to reach your calorie goal.  WHAT IS MY MY PLAN? My goal is to have __________ calories per day.  If I have this many  calories per day, I should lose around __________ pounds per week. WHAT DO I NEED TO KNOW ABOUT CALORIE COUNTING? In order to meet your daily calorie goal, you will need to:  Find out how many calories are in each food you would like to eat. Try to do this before you eat.  Decide how much of the food you can eat.  Write down what you ate and how many calories it had. Doing this is called keeping a food log. WHERE DO I FIND CALORIE INFORMATION? The number of calories in a food can be found on a Nutrition Facts label. Note that all the information on a label is based on a specific serving of the food. If a food does not have a Nutrition Facts label, try to look up the calories online or ask your dietitian for help. HOW DO I DECIDE HOW MUCH TO EAT? To decide how much of the food you can eat, you will need to consider both the number of calories in one serving and the size of one serving. This information can be found on the Nutrition Facts label. If a food does not have a Nutrition Facts label, look up the information online or ask your dietitian for help. Remember that calories are listed per serving. If you choose to have more than one serving of a food, you will have to multiply the calories per serving by the amount of servings you plan to eat. For example, the label on a package of bread might say that a serving size is 1 slice and that there are 90 calories in a serving. If you eat 1 slice, you will have eaten 90 calories. If you eat 2 slices, you will have eaten 180 calories. HOW DO I KEEP A FOOD LOG? After each meal, record the following information in your food log:  What you ate.  How much of it you ate.  How many calories it had.  Then, add up your calories. Keep your food log near you, such as in a small notebook in your pocket. Another option is to use a mobile app or website. Some programs will calculate calories for you and show you how many calories you have left each time you  add an item to the log. WHAT ARE SOME CALORIE COUNTING TIPS?  Use your calories on foods and drinks that will fill you up and not leave you hungry. Some examples of this include foods like nuts and nut butters, vegetables, lean proteins, and high-fiber foods (more than 5 g fiber per serving).  Eat nutritious foods and avoid empty calories. Empty calories are calories you get from foods or beverages that do not have many nutrients, such as candy and soda. It is better to have a nutritious high-calorie food (such as an avocado) than a food with few nutrients (such as a bag of chips).  Know how many calories are in the foods you eat most often. This way, you do not have to look up how many calories they have each time you eat them.  Look out for foods that may seem like low-calorie foods but are really high-calorie foods, such as baked goods, soda, and fat-free candy.  Pay attention to calories in drinks. Drinks such as sodas, specialty coffee drinks, alcohol, and juices have a lot of calories yet do not fill you up. Choose low-calorie drinks like water and diet drinks.  Focus your calorie counting efforts on higher calorie items. Logging the calories in a garden salad that contains only vegetables is less important than calculating the calories in a milk shake.  Find a way of tracking calories that works for you. Get creative. Most people who are successful find ways to keep track of how much they eat in a day, even if they do not count every calorie. WHAT ARE SOME PORTION CONTROL TIPS?  Know how many calories are in a serving. This will help you know how many servings of a certain food you can have.  Use a measuring cup to measure serving sizes. This is helpful when you start out. With time, you will be able to estimate serving sizes for some foods.  Take some time to put servings of different foods on your favorite plates, bowls, and cups so you know what a serving looks like.  Try not to eat  straight from a bag or box. Doing this can lead to overeating. Put the amount you would like to eat in a cup or on a plate to make sure you are eating the right portion.  Use smaller plates, glasses, and bowls to prevent overeating. This is a quick and easy way to practice portion control. If your plate is smaller, less food can fit on it.  Try not to multitask while eating, such as watching TV or using your computer. If it is time to eat, sit down at a table and enjoy your food. Doing this will help you to start recognizing when you are full. It will also make you more aware of what and how much you are eating. HOW CAN I CALORIE COUNT WHEN EATING OUT?  Ask for smaller portion sizes or child-sized portions.  Consider sharing an entree and sides instead of getting your own entree.  If you get your own entree, eat only half. Ask for a box at the beginning of your meal and put the rest of your entree in it so you are not tempted to eat it.  Look for the calories on the menu. If calories are listed, choose the lower calorie options.  Choose dishes that include vegetables, fruits, whole grains, low-fat dairy products, and lean protein. Focusing on smart food choices from each of the 5 food groups can help you stay on track at restaurants.  Choose items that are boiled, broiled, grilled, or steamed.  Choose water, milk, unsweetened iced tea, or other drinks without added sugars. If you want an alcoholic beverage, choose a lower calorie option. For example, a regular margarita can have up to 700 calories and a glass of wine  has around 150.  Stay away from items that are buttered, battered, fried, or served with cream sauce. Items labeled "crispy" are usually fried, unless stated otherwise.  Ask for dressings, sauces, and syrups on the side. These are usually very high in calories, so do not eat much of them.  Watch out for salads. Many people think salads are a healthy option, but this is often not  the case. Many salads come with bacon, fried chicken, lots of cheese, fried chips, and dressing. All of these items have a lot of calories. If you want a salad, choose a garden salad and ask for grilled meats or steak. Ask for the dressing on the side, or ask for olive oil and vinegar or lemon to use as dressing.  Estimate how many servings of a food you are given. For example, a serving of cooked rice is  cup or about the size of half a tennis ball or one cupcake wrapper. Knowing serving sizes will help you be aware of how much food you are eating at restaurants. The list below tells you how big or small some common portion sizes are based on everyday objects.  1 oz--4 stacked dice.  3 oz--1 deck of cards.  1 tsp--1 dice.  1 Tbsp-- a Ping-Pong ball.  2 Tbsp--1 Ping-Pong ball.   cup--1 tennis ball or 1 cupcake wrapper.  1 cup--1 baseball.   This information is not intended to replace advice given to you by your health care provider. Make sure you discuss any questions you have with your health care provider.   Document Released: 03/29/2005 Document Revised: 04/19/2014 Document Reviewed: 02/01/2013 Elsevier Interactive Patient Education 2016 Elsevier Inc. Cough, Adult Coughing is a reflex that clears your throat and your airways. Coughing helps to heal and protect your lungs. It is normal to cough occasionally, but a cough that happens with other symptoms or lasts a long time may be a sign of a condition that needs treatment. A cough may last only 2-3 weeks (acute), or it may last longer than 8 weeks (chronic). CAUSES Coughing is commonly caused by:  Breathing in substances that irritate your lungs.  A viral or bacterial respiratory infection.  Allergies.  Asthma.  Postnasal drip.  Smoking.  Acid backing up from the stomach into the esophagus (gastroesophageal reflux).  Certain medicines.  Chronic lung problems, including COPD (or rarely, lung cancer).  Other medical  conditions such as heart failure. HOME CARE INSTRUCTIONS  Pay attention to any changes in your symptoms. Take these actions to help with your discomfort:  Take medicines only as told by your health care provider.  If you were prescribed an antibiotic medicine, take it as told by your health care provider. Do not stop taking the antibiotic even if you start to feel better.  Talk with your health care provider before you take a cough suppressant medicine.  Drink enough fluid to keep your urine clear or pale yellow.  If the air is dry, use a cold steam vaporizer or humidifier in your bedroom or your home to help loosen secretions.  Avoid anything that causes you to cough at work or at home.  If your cough is worse at night, try sleeping in a semi-upright position.  Avoid cigarette smoke. If you smoke, quit smoking. If you need help quitting, ask your health care provider.  Avoid caffeine.  Avoid alcohol.  Rest as needed. SEEK MEDICAL CARE IF:   You have new symptoms.  You cough up pus.  Your cough does not get better after 2-3 weeks, or your cough gets worse.  You cannot control your cough with suppressant medicines and you are losing sleep.  You develop pain that is getting worse or pain that is not controlled with pain medicines.  You have a fever.  You have unexplained weight loss.  You have night sweats. SEEK IMMEDIATE MEDICAL CARE IF:  You cough up blood.  You have difficulty breathing.  Your heartbeat is very fast.   This information is not intended to replace advice given to you by your health care provider. Make sure you discuss any questions you have with your health care provider.   Document Released: 09/25/2010 Document Revised: 12/18/2014 Document Reviewed: 06/05/2014 Elsevier Interactive Patient Education [employer].

## 2015-03-29 ENCOUNTER — Encounter: Payer: Self-pay | Admitting: Family Medicine

## 2015-04-16 MED FILL — ?MONTELUKAST SOD 10 MG TAB: 10 | 30 days supply | Qty: 30 | Fill #2

## 2015-04-16 MED FILL — FLUTICASONE PROP 50 MCG SPR: 50 | 28 days supply | Qty: 16 | Fill #3

## 2015-04-16 MED FILL — $BREO ELLIPTA 100-25 MCG IH: 100-25 MCG | 28 days supply | Qty: 28 | Fill #6

## 2015-04-21 MED FILL — ?AMITIZA 8 MCG CAPSULE: 8 MCG | 30 days supply | Qty: 30 | Fill #0

## 2015-04-30 ENCOUNTER — Other Ambulatory Visit: Payer: Self-pay | Admitting: Internal Medicine

## 2015-04-30 DIAGNOSIS — K5900 Constipation, unspecified: Secondary | ICD-10-CM

## 2015-04-30 MED ORDER — LUBIPROSTONE 8 MCG PO CAPS: 8.0000 ug | ORAL_CAPSULE | Freq: Every day | ORAL | Status: DC

## 2015-04-30 MED ORDER — LUBIPROSTONE 8 MCG PO CAPS
8.0000 ug | ORAL_CAPSULE | Freq: Every day | ORAL | Status: DC
Start: 2015-04-30 — End: 2015-05-30

## 2015-05-30 ENCOUNTER — Encounter: Payer: Self-pay | Admitting: Family Medicine

## 2015-05-30 ENCOUNTER — Ambulatory Visit (INDEPENDENT_AMBULATORY_CARE_PROVIDER_SITE_OTHER): Payer: No Typology Code available for payment source | Admitting: Family Medicine

## 2015-05-30 VITALS — BP 110/71 | HR 114 | Temp 98.3°F | Resp 16 | Ht <= 58 in | Wt 150.0 lb

## 2015-05-30 DIAGNOSIS — Z9109 Other allergy status, other than to drugs and biological substances: Secondary | ICD-10-CM

## 2015-05-30 DIAGNOSIS — Z91048 Other nonmedicinal substance allergy status: Secondary | ICD-10-CM

## 2015-05-30 DIAGNOSIS — K029 Dental caries, unspecified: Secondary | ICD-10-CM

## 2015-05-30 DIAGNOSIS — K5900 Constipation, unspecified: Secondary | ICD-10-CM

## 2015-05-30 DIAGNOSIS — I471 Supraventricular tachycardia: Secondary | ICD-10-CM

## 2015-05-30 MED ORDER — METOPROLOL TARTRATE 25 MG PO TABS: 25.0000 mg | ORAL_TABLET | Freq: Two times a day (BID) | ORAL | Status: DC

## 2015-05-30 MED ORDER — LORATADINE 10 MG PO TABS: 10.0000 mg | ORAL_TABLET | Freq: Every day | ORAL | Status: DC

## 2015-05-30 MED ORDER — LUBIPROSTONE 8 MCG PO CAPS: 8.0000 ug | ORAL_CAPSULE | Freq: Every day | ORAL | Status: DC

## 2015-05-30 MED ORDER — FLUTICASONE FUROATE-VILANTEROL 100-25 MCG/INH IN AEPB: INHALATION_SPRAY | RESPIRATORY_TRACT | Status: DC

## 2015-05-30 MED ORDER — FLUTICASONE PROPIONATE 50 MCG/ACT NA SUSP: 2.0000 | Freq: Every day | NASAL | Status: DC

## 2015-05-30 MED ORDER — PANTOPRAZOLE SODIUM 20 MG PO TBEC: 20.0000 mg | DELAYED_RELEASE_TABLET | Freq: Every day | ORAL | Status: DC

## 2015-05-30 MED ORDER — MONTELUKAST SODIUM 10 MG PO TABS: 10.0000 mg | ORAL_TABLET | Freq: Every day | ORAL | Status: DC

## 2015-05-30 MED FILL — FLUTICASONE PROP 50 MCG SPR: 50 | 30 days supply | Qty: 16 | Fill #0

## 2015-05-30 MED FILL — METOPROLOL TARTRATE 25 MG T: 25 | 30 days supply | Qty: 60 | Fill #0

## 2015-05-30 MED FILL — PANTOPRAZOLE SOD DR 20 MG T: 20 | 30 days supply | Qty: 30 | Fill #0

## 2015-05-30 MED FILL — MONTELUKAST SOD 10 MG TAB: 10 | 30 days supply | Qty: 30 | Fill #0

## 2015-05-30 NOTE — Patient Instructions (Signed)
Take a whole metoprolol twice a day rather than 1/2 Keep a diary of how often the palpitations happen Come back in one month with diary. Avoid caffiene and other stimulants.

## 2015-05-30 NOTE — Progress Notes (Signed)
Patient ID: Betty Cain, female   DOB: 12/11/1975, 40 y.o.   MRN: [id number]   Betty Cain, is a 40 y.o. female  [id number]  [id number]  DOB - July 24, 1975  CC:  Chief Complaint  Patient presents with  . Palpitations    coughing   . Shortness of Breath  . Dizziness       HPI: Betty Cain is a 40 y.o. female here for follow-up of episodes of SVT. She is currently taking Lopressor 25 mg  1/2 tablet bid and continues to have episodes of tachycardia.She experiences  Some dizziness with the tachycardia. She sometimes has a episode daily, sometimes everyother day. She also request a referrral to dentist.  No Known Allergies Past Medical History  Diagnosis Date  . Anemia   . Neonatal death     Vaginal delivery, full term-lived x2 hours.   Marland Kitchen Spinal headache   . IUD (intrauterine device) in place 09/03/2011    Due out in 07/2016  . Valvular heart disease   . Palpitations   . Shortness of breath    Current Outpatient Prescriptions on File Prior to Visit  Medication Sig Dispense Refill  . acetaminophen (TYLENOL) 500 MG tablet Take 1 tablet (500 mg total) by mouth every 6 (six) hours as needed. 30 tablet 0  . fluticasone (FLONASE) 50 MCG/ACT nasal spray Place 2 sprays into both nostrils daily. 1 g 11  . Fluticasone Furoate-Vilanterol (BREO ELLIPTA) 100-25 MCG/INH AEPB One inhalation daily 3 each 3  . loratadine (CLARITIN) 10 MG tablet Take 1 tablet (10 mg total) by mouth daily. 30 tablet 5  . lubiprostone (AMITIZA) 8 MCG capsule Take 1 capsule (8 mcg total) by mouth daily with breakfast. 90 capsule 3  . montelukast (SINGULAIR) 10 MG tablet Take 10 mg by mouth at bedtime.    . folic acid (FOLVITE) 1 MG tablet Take 1 mg by mouth daily. Reported on 05/30/2015    . guaiFENesin (ROBITUSSIN) 100 MG/5ML SOLN Take 5 mLs (100 mg total) by mouth every 6 (six) hours as needed for cough or to loosen phlegm. (Patient not taking: Reported on 05/30/2015) 1200 mL 0  . naproxen (NAPROSYN) 500 MG  tablet Take 1 tablet (500 mg total) by mouth 2 (two) times daily with a meal. (Patient not taking: Reported on 05/30/2015) 60 tablet 1  . ondansetron (ZOFRAN) 4 MG tablet Take 1 tablet (4 mg total) by mouth every 8 (eight) hours as needed for nausea or vomiting. (Patient not taking: Reported on 05/30/2015) 20 tablet 0  . pantoprazole (PROTONIX) 20 MG tablet Take 1 tablet (20 mg total) by mouth daily. (Patient not taking: Reported on 05/30/2015) 30 tablet 1   No current facility-administered medications on file prior to visit.   Family History  Problem Relation Age of Onset  . Anesthesia problems Neg Hx   . Diabetes Mother   . Diabetes Father   . Diabetes Sister    Social History   Social History  . Marital Status: Married    Spouse Name: N/A  . Number of Children: N/A  . Years of Education: N/A   Occupational History  . Not on file.   Social History Main Topics  . Smoking status: Never Smoker   . Smokeless tobacco: Never Used  . Alcohol Use: No  . Drug Use: No  . Sexual Activity: Yes    Birth Control/ Protection: None     Comment: pregnant   Other Topics Concern  . Not on file  Social History Narrative    Review of Systems: Constitutional: Negative for fever, chills, appetite change, weight loss. Positive for fatigue. Skin: Negative for rashes or lesions of concern. HENT: Negative for ear pain, ear discharge.nose bleeds. Positive for ringing in ears. Positive for ST.  Eyes: Negative for pain, discharge, redness, itching and visual disturbance. Neck: Negative for pain, stiffness Respiratory: Negative for shortness of breath. Positive for cough. Cardiovascular: Negative for chest pain, and leg swelling. Gastrointestinal: Negative for abdominal pain, nausea, vomiting, diarrhea, constipations Genitourinary: Negative for dysuria, urgency, frequency, hematuria,  Musculoskeletal: Negative for back pain, joint pain, joint  swelling, and gait problem.Negative for  weakness. Neurological: Negative for  tremors, seizures, syncope,   light-headedness, numbness and headaches. Positive for dizziness Hematological: Negative for easy bruising or bleeding Psychiatric/Behavioral: Negative for depression, anxiety, decreased concentration, confusion   Objective:   Filed Vitals:   05/30/15 1127  BP: 110/71  Pulse: 114  Temp: 98.3 F (36.8 C)  Resp: 16    Physical Exam: Constitutional: Patient appears well-developed and well-nourished. No distress. HENT: Normocephalic, atraumatic, External right and left ear normal. Oropharynx is clear and moist. Teeth in fair repair Eyes: Conjunctivae and EOM are normal. PERRLA, no scleral icterus. Neck: Normal ROM. Neck supple. No lymphadenopathy, No thyromegaly. CVS: RRR, S1/S2 +, no murmurs, no gallops, no rubs Pulmonary: Effort and breath sounds normal, no stridor, rhonchi, wheezes, rales.  Abdominal: Soft. Normoactive BS,, no distension, tenderness, rebound or guarding.  Musculoskeletal: Normal range of motion. No edema and no tenderness.  Neuro: Alert.Normal muscle tone coordination. Non-focal Skin: Skin is warm and dry. No rash noted. Not diaphoretic. No erythema. No pallor. Psychiatric: Normal mood and affect. Behavior, judgment, thought content normal.  Lab Results  Component Value Date   WBC 6.5 01/21/2015   HGB 11.8* 01/21/2015   HCT 35.0* 01/21/2015   MCV 80.5 01/21/2015   PLT 386 01/21/2015   Lab Results  Component Value Date   CREATININE 0.50 01/21/2015   BUN 9 01/21/2015   NA 139 01/21/2015   K 3.9 01/21/2015   CL 107 01/21/2015   CO2 22 01/21/2015    Lab Results  Component Value Date   HGBA1C 5.8* 01/21/2015   Lipid Panel     Component Value Date/Time   CHOL 165 10/08/2014 1121   TRIG 107 10/08/2014 1121   HDL 30* 10/08/2014 1121   CHOLHDL 5.5 10/08/2014 1121   VLDL 21 10/08/2014 1121   LDLCALC 114* 10/08/2014 1121       Assessment and plan:   1. Paroxysmal supraventricular  tachycardia (HCC)  - metoprolol tartrate (LOPRESSOR) 25 MG tablet; Take 1 tablet (25 mg total) by mouth 2 (two) times daily.  Dispense: 60 tablet; Refill: 2 -Have asked her to keep a diary of episodes of tachycardia to see if episodes of tachycardia decrease with increased dosage.  2. Dental caries  - Ambulatory referral to Dentistry   Return in about 1 month (around 06/27/2015).  The patient was given clear instructions to go to ER or return to medical center if symptoms don't improve, worsen or new problems develop. The patient verbalized understanding.    Micheline Chapman FNP  05/30/2015, 12:05 PM

## 2015-06-03 MED FILL — $BREO ELLIPTA 100-25 MCG IH: 100-25 MCG | 30 days supply | Qty: 60 | Fill #0

## 2015-06-24 MED FILL — $AMITIZA 8 MCG CAPSULE: 8 MCG | 30 days supply | Qty: 30 | Fill #0

## 2015-07-04 MED FILL — METOPROLOL TARTRATE 25 MG T: 25 | 30 days supply | Qty: 60 | Fill #1

## 2015-07-04 MED FILL — PANTOPRAZOLE SOD DR 20 MG T: 20 | 30 days supply | Qty: 30 | Fill #1

## 2015-07-04 MED FILL — FLUTICASONE PROP 50 MCG SPR: 50 | 30 days supply | Qty: 16 | Fill #1

## 2015-07-04 MED FILL — MONTELUKAST SOD 10 MG TAB: 10 | 30 days supply | Qty: 30 | Fill #1

## 2015-07-04 MED FILL — $BREO ELLIPTA 100-25 MCG IH: 100-25 MCG | 30 days supply | Qty: 60 | Fill #1

## 2015-07-10 ENCOUNTER — Ambulatory Visit: Payer: No Typology Code available for payment source | Admitting: Family Medicine

## 2015-07-21 ENCOUNTER — Ambulatory Visit (INDEPENDENT_AMBULATORY_CARE_PROVIDER_SITE_OTHER): Payer: No Typology Code available for payment source | Admitting: Family Medicine

## 2015-07-21 VITALS — BP 113/55 | HR 85 | Temp 99.0°F | Resp 16 | Ht <= 58 in | Wt 151.0 lb

## 2015-07-21 DIAGNOSIS — I38 Endocarditis, valve unspecified: Secondary | ICD-10-CM

## 2015-07-21 DIAGNOSIS — Z789 Other specified health status: Secondary | ICD-10-CM

## 2015-07-21 DIAGNOSIS — R002 Palpitations: Secondary | ICD-10-CM

## 2015-07-21 DIAGNOSIS — R42 Dizziness and giddiness: Secondary | ICD-10-CM

## 2015-07-21 LAB — POCT URINALYSIS DIP (DEVICE)
BILIRUBIN URINE: NEGATIVE
GLUCOSE, UA: NEGATIVE mg/dL
Ketones, ur: NEGATIVE mg/dL
LEUKOCYTES UA: NEGATIVE
NITRITE: NEGATIVE
Protein, ur: NEGATIVE mg/dL
UROBILINOGEN UA: 0.2 mg/dL (ref 0.0–1.0)
pH: 5.5 (ref 5.0–8.0)

## 2015-07-21 LAB — COMPLETE METABOLIC PANEL WITH GFR
ALT: 9 U/L (ref 6–29)
AST: 10 U/L (ref 10–30)
Albumin: 4.3 g/dL (ref 3.6–5.1)
Alkaline Phosphatase: 67 U/L (ref 33–115)
BUN: 10 mg/dL (ref 7–25)
CALCIUM: 9.3 mg/dL (ref 8.6–10.2)
CHLORIDE: 107 mmol/L (ref 98–110)
CO2: 23 mmol/L (ref 20–31)
CREATININE: 0.37 mg/dL — AB (ref 0.50–1.10)
GFR, Est African American: >89 mL/min (ref >=60)
GFR, Est Non African American: >89 mL/min (ref >=60)
Glucose, Bld: 92 mg/dL (ref 65–99)
Potassium: 4.2 mmol/L (ref 3.5–5.3)
Sodium: 139 mmol/L (ref 135–146)
TOTAL PROTEIN: 7.2 g/dL (ref 6.1–8.1)
Total Bilirubin: 0.4 mg/dL (ref 0.2–1.2)

## 2015-07-21 LAB — CBC WITH DIFFERENTIAL/PLATELET
BASOS PCT: 1 %
Basophils Absolute: 66 cells/uL (ref 0–200)
EOS PCT: 1 %
Eosinophils Absolute: 66 cells/uL (ref 15–500)
HEMATOCRIT: 35 % (ref 35.0–45.0)
Hemoglobin: 11.5 g/dL — ABNORMAL LOW (ref 11.7–15.5)
Lymphocytes Relative: 41 %
Lymphs Abs: 2706 cells/uL (ref 850–3900)
MCH: 26.9 pg — ABNORMAL LOW (ref 27.0–33.0)
MCHC: 32.9 g/dL (ref 32.0–36.0)
MCV: 82 fL (ref 80.0–100.0)
MONOS PCT: 5 %
MPV: 10.5 fL (ref 7.5–12.5)
Monocytes Absolute: 330 cells/uL (ref 200–950)
NEUTROS ABS: 3432 {cells}/uL (ref 1500–7800)
Neutrophils Relative %: 52 %
Platelets: 358 10*3/uL (ref 140–400)
RBC: 4.27 MIL/uL (ref 3.80–5.10)
RDW: 14.7 % (ref 11.0–15.0)
WBC: 6.6 10*3/uL (ref 3.8–10.8)

## 2015-07-21 LAB — POCT URINE PREGNANCY: PREG TEST UR: NEGATIVE

## 2015-07-21 MED ORDER — MECLIZINE HCL 12.5 MG PO TABS: 12.5000 mg | ORAL_TABLET | Freq: Three times a day (TID) | ORAL | Status: DC | PRN

## 2015-07-21 NOTE — Progress Notes (Signed)
Subjective:    Patient ID: Betty Cain, female    DOB: 20-May-1975, 40 y.o.   MRN: [id number]  HPI [name], a 40 year old patient with a history of valvular heart disease presents complaining of vertigo. She states that she has been experiencing vertigo for greater than 1 month. She has been maintaining a diary of vertigo and it has been occurring 4-5 times per day, primarily with transitioning positions.   The patient describes the symptoms as a feeling of weekness and faintness thatr encourages her to lie down which causes the feeling to disappear. . Symptoms are exacerbated by transitioning from lying to sitting.  Patient denies aural pressure, otalgia, otorrhea, tinnitus, or hearing loss. She has not been treated with medications for vertigo.   Past Medical History  Diagnosis Date  . Anemia   . Neonatal death     Vaginal delivery, full term-lived x2 hours.   Marland Kitchen Spinal headache   . IUD (intrauterine device) in place 09/03/2011    Due out in 07/2016  . Valvular heart disease   . Palpitations   . Shortness of breath    Immunization History  Administered Date(s) Administered  . Influenza,inj,Quad PF,36+ Mos 06/19/2013  . Tdap 09/13/2013, 12/11/2013   Review of Systems  Constitutional: Negative.   HENT: Positive for ear discharge.   Eyes: Negative.   Respiratory: Negative.   Cardiovascular: Negative.   Gastrointestinal: Negative.   Musculoskeletal: Negative.   Neurological: Positive for dizziness and light-headedness. Negative for tremors, seizures, syncope, speech difficulty, weakness and numbness.  Hematological: Negative.   Psychiatric/Behavioral: Negative.        Objective:   Physical Exam  Constitutional: She is oriented to person, place, and time. She appears well-developed and well-nourished.  HENT:  Head: Normocephalic and atraumatic.  Right Ear: External ear normal.  Left Ear: External ear normal.  Nose: Nose normal.  Mouth/Throat: Oropharynx is clear  and moist.  Eyes: Conjunctivae and EOM are normal. Pupils are equal, round, and reactive to light.  Neck: Normal range of motion. Neck supple.  Cardiovascular: Normal rate, regular rhythm, normal heart sounds and intact distal pulses.   Pulmonary/Chest: Effort normal and breath sounds normal.  Abdominal: Soft. Bowel sounds are normal.  Musculoskeletal: Normal range of motion.  Neurological: She is alert and oriented to person, place, and time. She has normal reflexes.  No nystagmus present Vertigo elicited when transitioned from sitting to standing  Skin: Skin is warm and dry.  Psychiatric: She has a normal mood and affect. Her behavior is normal. Judgment and thought content normal.      BP 113/55 mmHg  Pulse 85  Temp(Src) 99 F (37.2 C) (Oral)  Resp 16  Ht 4\' 10"  (1.473 m)  Wt 151 lb (68.493 kg)  BMI 31.57 kg/m2  SpO2 100% Assessment & Plan:  1. Vertigo Will follow up in 1 month for vertigo. Will start a trial of Meclizine.  - meclizine (ANTIVERT) 12.5 MG tablet; Take 1 tablet (12.5 mg total) by mouth 3 (three) times daily as needed for dizziness.  Dispense: 60 tablet; Refill: 0 - CBC with Differential - COMPLETE METABOLIC PANEL WITH GFR - POCT urine pregnancy  2. Valvular heart disease      Will continue metoprolol for valvular heart disease.   3. Palpitations - EKG; Future - Orthostatic vital signs  4. Language barrier to communication Patient primarily speaks arabic, used video interpreter to assist with communication     RTC: 1 month for vertigo  The patient was given clear instructions to go to ER or return to medical center if symptoms do not improve, worsen or new problems develop. The patient verbalized understanding. Will notify patient with laboratory results.   Dorena Dew, FNP

## 2015-07-22 ENCOUNTER — Telehealth: Payer: Self-pay

## 2015-07-22 ENCOUNTER — Encounter: Payer: Self-pay | Admitting: Family Medicine

## 2015-07-22 DIAGNOSIS — R42 Dizziness and giddiness: Secondary | ICD-10-CM | POA: Insufficient documentation

## 2015-07-22 NOTE — Telephone Encounter (Signed)
Called and spoke with patient, advised of labs and to take iron supplement daily and eat iron rich diet. Such as green leafy veggies and organ meats. Asked patient to keep follow up appointment and patient verbalized understanding. Thanks!

## 2015-07-22 NOTE — Telephone Encounter (Signed)
-----   Message from Dorena Dew, [place] sent at 07/22/2015  7:32 AM EDT ----- Regarding: lab results Please inform patient that she is mildly anemic. Please continue iron supplement. Also, increase green, leafy veggies, organ meats, and protein sources. Please follow up in office as scheduled. All other labs are within a normal range.     Thanks ----- Message -----    From: Adelina Mings, LPN    Sent: [id number]  11:37 AM      To: Dorena Dew, FNP

## 2015-07-23 MED FILL — TRAVEL SICKNESS 25 MG TAB C: 25 | 20 days supply | Qty: 30 | Fill #0

## 2015-08-11 MED FILL — $BREO ELLIPTA 100-25 MCG IH: 100-25 MCG | 30 days supply | Qty: 60 | Fill #2

## 2015-08-11 MED FILL — PANTOPRAZOLE SOD DR 20 MG T: 20 | 30 days supply | Qty: 30 | Fill #2

## 2015-08-11 MED FILL — $AMITIZA 8 MCG CAPSULE: 8 MCG | 30 days supply | Qty: 30 | Fill #1

## 2015-08-11 MED FILL — METOPROLOL TARTRATE 25 MG T: 25 | 30 days supply | Qty: 60 | Fill #2

## 2015-08-11 MED FILL — ?MONTELUKAST SOD 10 MG TAB: 10 | 30 days supply | Qty: 30 | Fill #2

## 2015-08-11 MED FILL — FLUTICASONE PROP 50 MCG SPR: 50 | 28 days supply | Qty: 16 | Fill #4

## 2015-09-01 ENCOUNTER — Encounter: Payer: Self-pay | Admitting: Family Medicine

## 2015-09-01 ENCOUNTER — Ambulatory Visit (INDEPENDENT_AMBULATORY_CARE_PROVIDER_SITE_OTHER): Payer: No Typology Code available for payment source | Admitting: Family Medicine

## 2015-09-01 VITALS — BP 118/67 | HR 78 | Temp 98.9°F | Resp 16 | Ht <= 58 in | Wt 150.0 lb

## 2015-09-01 DIAGNOSIS — Z789 Other specified health status: Secondary | ICD-10-CM

## 2015-09-01 DIAGNOSIS — R631 Polydipsia: Secondary | ICD-10-CM

## 2015-09-01 DIAGNOSIS — R1084 Generalized abdominal pain: Secondary | ICD-10-CM

## 2015-09-01 DIAGNOSIS — R7303 Prediabetes: Secondary | ICD-10-CM

## 2015-09-01 LAB — POCT URINALYSIS DIP (DEVICE)
Bilirubin Urine: NEGATIVE
Glucose, UA: NEGATIVE mg/dL
Ketones, ur: NEGATIVE mg/dL
Leukocytes, UA: NEGATIVE
NITRITE: NEGATIVE
PROTEIN: NEGATIVE mg/dL
Specific Gravity, Urine: 1.01 (ref 1.005–1.030)
UROBILINOGEN UA: 0.2 mg/dL (ref 0.0–1.0)
pH: 7 (ref 5.0–8.0)

## 2015-09-01 LAB — COMPLETE METABOLIC PANEL WITH GFR
ALT: 9 U/L (ref 6–29)
AST: 12 U/L (ref 10–30)
Albumin: 4.4 g/dL (ref 3.6–5.1)
Alkaline Phosphatase: 69 U/L (ref 33–115)
BUN: 10 mg/dL (ref 7–25)
CALCIUM: 9.4 mg/dL (ref 8.6–10.2)
CHLORIDE: 106 mmol/L (ref 98–110)
CO2: 22 mmol/L (ref 20–31)
Creat: 0.58 mg/dL (ref 0.50–1.10)
GFR, Est African American: >89 mL/min (ref >=60)
GFR, Est Non African American: >89 mL/min (ref >=60)
Glucose, Bld: 108 mg/dL — ABNORMAL HIGH (ref 65–99)
POTASSIUM: 4.1 mmol/L (ref 3.5–5.3)
SODIUM: 138 mmol/L (ref 135–146)
Total Bilirubin: 0.4 mg/dL (ref 0.2–1.2)
Total Protein: 7.5 g/dL (ref 6.1–8.1)

## 2015-09-01 NOTE — Progress Notes (Signed)
Subjective:    Patient ID: Betty Cain, female    DOB: 12/30/1975, 40 y.o.   MRN: [id number]  Abdominal Pain This is a new problem. The current episode started in the past 7 days. The onset quality is gradual. The problem occurs intermittently. The problem has been gradually improving. The pain is located in the generalized abdominal region. The pain is at a severity of 1/10. The pain is mild. The quality of the pain is aching. The abdominal pain does not radiate. Associated symptoms include belching and flatus. Pertinent negatives include no anorexia, arthralgias, constipation, diarrhea, dysuria, fever, frequency, headaches, hematochezia, hematuria, melena, myalgias, nausea, vomiting or weight loss. The pain is relieved by belching and passing flatus. She has tried nothing for the symptoms. The treatment provided no relief. Her past medical history is significant for irritable bowel syndrome.    Past Medical History  Diagnosis Date  . Anemia   . Neonatal death     Vaginal delivery, full term-lived x2 hours.   Marland Kitchen Spinal headache   . IUD (intrauterine device) in place 09/03/2011    Due out in 07/2016  . Valvular heart disease   . Palpitations   . Shortness of breath    Immunization History  Administered Date(s) Administered  . Influenza,inj,Quad PF,36+ Mos 06/19/2013  . Tdap 09/13/2013, 12/11/2013   Review of Systems  Constitutional: Negative.  Negative for fever and weight loss.  Eyes: Negative.   Respiratory: Negative.   Cardiovascular: Negative.   Gastrointestinal: Positive for abdominal pain and flatus. Negative for nausea, vomiting, diarrhea, constipation, melena, hematochezia and anorexia.  Endocrine: Negative.   Genitourinary: Negative.  Negative for dysuria, frequency and hematuria.  Musculoskeletal: Negative.  Negative for myalgias and arthralgias.  Skin: Negative.   Neurological: Negative for tremors, seizures, syncope, speech difficulty, weakness, numbness and headaches.   Hematological: Negative.   Psychiatric/Behavioral: Negative.        Objective:   Physical Exam  Constitutional: She is oriented to person, place, and time. She appears well-developed and well-nourished.  HENT:  Head: Normocephalic and atraumatic.  Right Ear: External ear normal.  Left Ear: External ear normal.  Nose: Nose normal.  Mouth/Throat: Oropharynx is clear and moist.  Eyes: Conjunctivae and EOM are normal. Pupils are equal, round, and reactive to light.  Neck: Normal range of motion. Neck supple.  Cardiovascular: Normal rate, regular rhythm, normal heart sounds and intact distal pulses.   Pulmonary/Chest: Effort normal and breath sounds normal.  Abdominal: Soft. Bowel sounds are normal. There is generalized tenderness.  Musculoskeletal: Normal range of motion.  Neurological: She is alert and oriented to person, place, and time. She has normal reflexes.  Skin: Skin is warm and dry.  Psychiatric: She has a normal mood and affect. Her behavior is normal. Judgment and thought content normal.      BP 118/67 mmHg  Pulse 78  Temp(Src) 98.9 F (37.2 C) (Oral)  Resp 16  Ht 4\' 10"  (1.473 m)  Wt 150 lb (68.04 kg)  BMI 31.36 kg/m2  SpO2 99% Assessment & Plan:  1. Generalized abdominal pain Recommend a low fat diet divided over 6 small meals. Increase water intake to 6-8 glasses. Decrease gas producing foods (vegetable soup, beans, high fat meats).  - COMPLETE METABOLIC PANEL WITH GFR - Urinalysis Dipstick - POCT urinalysis dip (device)  2. Prediabetes Previous a1C is 5.8, will re-check hemoglobin a1C on today. Recommend a lowfat, low carbohydrate diet divided over 5-6 small meals, increase water intake to 6-8 glasses,  and 150 minutes per week of cardiovascular exercise.   - Hemoglobin A1c - COMPLETE METABOLIC PANEL WITH GFR  3. Polydipsia  - Hemoglobin A1c  4. Language barrier to communication Utilized language line to assist with communication   RTC: 3 months for  valvular heart disease. Will follow up by phone with laboratory results.   The patient was given clear instructions to go to ER or return to medical center if symptoms do not improve, worsen or new problems develop. The patient verbalized understanding. Will notify patient with laboratory results.   Dorena Dew, FNP

## 2015-09-01 NOTE — Patient Instructions (Signed)

## 2015-09-02 ENCOUNTER — Telehealth: Payer: Self-pay

## 2015-09-02 LAB — HEMOGLOBIN A1C
HEMOGLOBIN A1C: 6 % — AB (ref <5.7)
MEAN PLASMA GLUCOSE: 126 mg/dL

## 2015-09-02 NOTE — Telephone Encounter (Signed)
-----   Message from Dorena Dew, [place] sent at 09/02/2015  7:58 AM EDT ----- Regarding: lab results  Please inform patient that hemoglobin a1C has increased to 6.0, which has increased from previous values. Recommend a lowfat, low carbohydrate diet divided over 5-6 small meals, increase water intake to 6-8 glasses, and 150 minutes per week of cardiovascular exercise.  Please stress the importance of following a carbohydrate modified diet.   Thanks ----- Message -----    From: Lab in [facility]    Sent: 09/02/2015   2:09 AM      To: Dorena Dew, FNP

## 2015-09-02 NOTE — Telephone Encounter (Signed)
Called and spoke with patient, advised of labs and to watch diet and get exercise. Patient verbalized understanding. Thanks!

## 2015-09-22 ENCOUNTER — Telehealth: Payer: Self-pay

## 2015-09-22 ENCOUNTER — Other Ambulatory Visit: Payer: Self-pay

## 2015-09-22 DIAGNOSIS — I471 Supraventricular tachycardia: Secondary | ICD-10-CM

## 2015-09-22 MED ORDER — METOPROLOL TARTRATE 25 MG PO TABS: 25.0000 mg | ORAL_TABLET | Freq: Two times a day (BID) | ORAL | Status: DC

## 2015-09-22 MED FILL — METOPROLOL TARTRATE 25 MG T: 25 | 30 days supply | Qty: 60 | Fill #0

## 2015-09-22 NOTE — Telephone Encounter (Signed)
Metoprlol has been refilled to pharmacy.

## 2015-09-22 NOTE — Telephone Encounter (Signed)
Betty Cain, you probably already did this, but here you go! Thanks!

## 2015-09-22 NOTE — Telephone Encounter (Signed)
Pt is requesting medication refill for Metoprolol. Thanks!

## 2015-09-23 MED FILL — **BREO ELLIPTA 100-25 MCG I: 100-25MCG | 28 days supply | Qty: 56 | Fill #3

## 2015-09-23 MED FILL — ?MONTELUKAST SOD 10 MG TAB: 10 | 30 days supply | Qty: 30 | Fill #3

## 2015-09-23 MED FILL — $AMITIZA 8 MCG CAPSULE: 8 MCG | 30 days supply | Qty: 30 | Fill #2

## 2015-09-23 NOTE — Telephone Encounter (Signed)
This has been refilled.  Thanks. 

## 2015-10-30 MED FILL — MONTELUKAST SOD 10 MG TAB: 10 | 30 days supply | Qty: 30 | Fill #4

## 2015-10-30 MED FILL — ?METOPROLOL 25 MG TABLET: 25 | 30 days supply | Qty: 60 | Fill #1

## 2015-10-30 MED FILL — **BREO ELLIPTA 100-25 MCG I: 100-25MCG | 30 days supply | Qty: 60 | Fill #4

## 2015-10-30 MED FILL — $AMITIZA 8 MCG CAPSULE: 8 MCG | 30 days supply | Qty: 30 | Fill #3

## 2015-10-30 MED FILL — PANTOPRAZOLE SOD DR 20 MG T: 20 | 30 days supply | Qty: 30 | Fill #3

## 2015-12-02 ENCOUNTER — Ambulatory Visit (INDEPENDENT_AMBULATORY_CARE_PROVIDER_SITE_OTHER): Payer: Self-pay | Admitting: Family Medicine

## 2015-12-02 ENCOUNTER — Encounter: Payer: Self-pay | Admitting: Family Medicine

## 2015-12-02 VITALS — BP 98/58 | HR 91 | Temp 98.1°F | Resp 18 | Ht <= 58 in | Wt 148.0 lb

## 2015-12-02 DIAGNOSIS — R079 Chest pain, unspecified: Secondary | ICD-10-CM

## 2015-12-02 DIAGNOSIS — Z91048 Other nonmedicinal substance allergy status: Secondary | ICD-10-CM

## 2015-12-02 DIAGNOSIS — I471 Supraventricular tachycardia, unspecified: Secondary | ICD-10-CM

## 2015-12-02 DIAGNOSIS — Z9109 Other allergy status, other than to drugs and biological substances: Secondary | ICD-10-CM

## 2015-12-02 DIAGNOSIS — Z23 Encounter for immunization: Secondary | ICD-10-CM

## 2015-12-02 DIAGNOSIS — R1084 Generalized abdominal pain: Secondary | ICD-10-CM

## 2015-12-02 DIAGNOSIS — K5900 Constipation, unspecified: Secondary | ICD-10-CM

## 2015-12-02 DIAGNOSIS — M25569 Pain in unspecified knee: Secondary | ICD-10-CM

## 2015-12-02 MED ORDER — MONTELUKAST SODIUM 10 MG PO TABS: 10.0000 mg | ORAL_TABLET | Freq: Every day | ORAL | 11 refills | Status: DC

## 2015-12-02 MED ORDER — NAPROXEN 500 MG PO TABS: 500.0000 mg | ORAL_TABLET | Freq: Two times a day (BID) | ORAL | 1 refills | Status: DC

## 2015-12-02 MED ORDER — FLUTICASONE FUROATE-VILANTEROL 100-25 MCG/INH IN AEPB: INHALATION_SPRAY | RESPIRATORY_TRACT | 3 refills | Status: DC

## 2015-12-02 MED ORDER — FOLIC ACID 1 MG PO TABS: 1.0000 mg | ORAL_TABLET | Freq: Every day | ORAL | 3 refills | Status: DC

## 2015-12-02 MED ORDER — FLUTICASONE PROPIONATE 50 MCG/ACT NA SUSP: 2.0000 | Freq: Every day | NASAL | 11 refills | Status: DC

## 2015-12-02 MED ORDER — METOPROLOL TARTRATE 25 MG PO TABS: 25.0000 mg | ORAL_TABLET | Freq: Two times a day (BID) | ORAL | 2 refills | Status: DC

## 2015-12-02 MED ORDER — LUBIPROSTONE 8 MCG PO CAPS: 8.0000 ug | ORAL_CAPSULE | Freq: Every day | ORAL | 3 refills | Status: DC

## 2015-12-02 MED ORDER — LORATADINE 10 MG PO TABS: 10.0000 mg | ORAL_TABLET | Freq: Every day | ORAL | 5 refills | Status: DC

## 2015-12-02 MED FILL — FOLIC ACID 1 MG TABLET: 1 | 30 days supply | Qty: 30 | Fill #0

## 2015-12-02 MED FILL — NAPROXEN 500 MG TABLET: 500 | 60 days supply | Qty: 60 | Fill #0

## 2015-12-02 MED FILL — BREO ELLIPTA 100-25 MCG INH: 100-25 | 20 days supply | Qty: 60 | Fill #0

## 2015-12-02 MED FILL — FLUTICASONE PROP 50 MCG SPR: 50 | 30 days supply | Qty: 16 | Fill #0

## 2015-12-02 MED FILL — METOPROLOL TARTRATE 25 MG T: 25 | 30 days supply | Qty: 60 | Fill #0

## 2015-12-02 MED FILL — MONTELUKAST SOD 10 MG TAB: 10 | 30 days supply | Qty: 30 | Fill #0

## 2015-12-02 MED FILL — $AMITIZA 8 MCG CAPSULE: 8 MCG | 30 days supply | Qty: 30 | Fill #0

## 2015-12-02 NOTE — Progress Notes (Signed)
Betty Cain, is a 40 y.o. female  [id number]  [id number]  DOB - [birth year]  CC:  Chief Complaint  Patient presents with  . Follow-up       HPI: Betty Cain is a 40 y.o. female here for follow-up chronic conditions. Her main complaint today is of chest pain and knee pain.She has a history of anemia, seasonal allergies, constipation, palpitations, She is needing refills on Ferous sulfate, flonase, claritin, amitiza, metoprolol singular, folic acid and Breo. She reports the chest pain has been off and on for a few days. It is aggravated by moving, particularly twisting motion. She is aware of no injury. Her knee pain is chronic in nature. An EKG today shows no acute changes.   Health maintenance. Flu vaccine today. Screening A1C in May was 6.0. Last PaP was in March 2015.   No Known Allergies Past Medical History:  Diagnosis Date  . Anemia   . IUD (intrauterine device) in place 09/03/2011   Due out in 08-03-2016  . Neonatal death    Vaginal delivery, full term-lived x2 hours.   . Palpitations   . Shortness of breath   . Spinal headache   . Valvular heart disease    Current Outpatient Prescriptions on File Prior to Visit  Medication Sig Dispense Refill  . acetaminophen (TYLENOL) 500 MG tablet Take 1 tablet (500 mg total) by mouth every 6 (six) hours as needed. 30 tablet 0  . meclizine (ANTIVERT) 12.5 MG tablet Take 1 tablet (12.5 mg total) by mouth 3 (three) times daily as needed for dizziness. (Patient not taking: Reported on 12/02/2015) 60 tablet 0   No current facility-administered medications on file prior to visit.    Family History  Problem Relation Age of Onset  . Diabetes Mother   . Diabetes Father   . Diabetes Sister   . Anesthesia problems Neg Hx    Social History   Social History  . Marital status: Married    Spouse name: N/A  . Number of children: N/A  . Years of education: N/A   Occupational History  . Not on file.   Social History Main Topics   . Smoking status: Never Smoker  . Smokeless tobacco: Never Used  . Alcohol use No  . Drug use: No  . Sexual activity: Yes    Birth control/ protection: None     Comment: pregnant   Other Topics Concern  . Not on file   Social History Narrative  . No narrative on file    Review of Systems: Constitutional: Negative for fever, chills, appetite change, weight loss,  Fatigue. Skin: Negative for rashes or lesions of concern. HENT: Negative for ear pain, ear discharge.nose bleeds Eyes: Negative for pain, discharge, redness, itching and visual disturbance. Neck: Negative for pain, stiffness Respiratory: Negative for cough, shortness of breath,   Cardiovascular: Negative for chest pain, palpitations and leg swelling. Gastrointestinal: Negative for abdominal pain, nausea, vomiting, diarrhea, constipations Genitourinary: Negative for dysuria, urgency, frequency, hematuria,  Musculoskeletal: Negative for back pain, joint pain, joint  swelling, and gait problem.Negative for weakness.Chest wall pain and knee pain Neurological: Negative for dizziness, tremors, seizures, syncope,   light-headedness, numbness and headaches.  Hematological: Negative for easy bruising or bleeding Psychiatric/Behavioral: Negative for depression, anxiety, decreased concentration, confusion   Objective:   Vitals:   12/02/15 1053  BP: (!) 98/58  Pulse: 91  Resp: 18  Temp: 98.1 F (36.7 C)    Physical Exam: Constitutional: Patient appears well-developed and well-nourished.  No distress. HENT: Normocephalic, atraumatic, External right and left ear normal. Oropharynx is clear and moist.  Eyes: Conjunctivae and EOM are normal. PERRLA, no scleral icterus. Neck: Normal ROM. Neck supple. No lymphadenopathy, No thyromegaly. CVS: RRR, S1/S2 +, no murmurs, no gallops, no rubs Pulmonary: Effort and breath sounds normal, no stridor, rhonchi, wheezes, rales.  Abdominal: Soft. Normoactive BS,, no distension, tenderness,  rebound or guarding.  Musculoskeletal: Normal range of motion. No edema. There is tenderness to palpation of the left upper chest wall. Neuro: Alert.Normal muscle tone coordination. Non-focal Skin: Skin is warm and dry. No rash noted. Not diaphoretic. No erythema. No pallor. Psychiatric: Normal mood and affect. Behavior, judgment, thought content normal.  Lab Results  Component Value Date   WBC 6.6 07/21/2015   HGB 11.5 (L) 07/21/2015   HCT 35.0 07/21/2015   MCV 82.0 07/21/2015   PLT 358 07/21/2015   Lab Results  Component Value Date   CREATININE 0.58 09/01/2015   BUN 10 09/01/2015   NA 138 09/01/2015   K 4.1 09/01/2015   CL 106 09/01/2015   CO2 22 09/01/2015    Lab Results  Component Value Date   HGBA1C 6.0 (H) 09/01/2015   Lipid Panel     Component Value Date/Time   CHOL 165 10/08/2014 1121   TRIG 107 10/08/2014 1121   HDL 30 (L) 10/08/2014 1121   CHOLHDL 5.5 10/08/2014 1121   VLDL 21 10/08/2014 1121   LDLCALC 114 (H) 10/08/2014 1121       Assessment and plan:   1. Chest pain, unspecified chest pain type  - EKG 12-Lead  2. Environmental allergies  - loratadine (CLARITIN) 10 MG tablet; Take 1 tablet (10 mg total) by mouth daily.  Dispense: 30 tablet; Refill: 5 - montelukast (SINGULAIR) 10 MG tablet; Take 1 tablet (10 mg total) by mouth at bedtime.  Dispense: 30 tablet; Refill: 11 - fluticasone (FLONASE) 50 MCG/ACT nasal spray; Place 2 sprays into both nostrils daily.  Dispense: 1 g; Refill: 11     3. Paroxysmal supraventricular tachycardia (HCC)  - metoprolol tartrate (LOPRESSOR) 25 MG tablet; Take 1 tablet (25 mg total) by mouth 2 (two) times daily.  Dispense: 60 tablet; Refill: 2  4. Constipation, unspecified constipation type  - lubiprostone (AMITIZA) 8 MCG capsule; Take 1 capsule (8 mcg total) by mouth daily with breakfast.  Dispense: 90 capsule; Refill: 3  6. Need for vaccination for H flu type B  - Flu Vaccine QUAD 36+ mos PF IM (Fluarix &  Fluzone Quad PF)  7. Knee pain, unspecified laterality  - naproxen (NAPROSYN) 500 MG tablet; Take 1 tablet (500 mg total) by mouth 2 (two) times daily with a meal.  Dispense: 60 tablet; Refill: 1   No Follow-up on file.  The patient was given clear instructions to go to ER or return to medical center if symptoms don't improve, worsen or new problems develop. The patient verbalized understanding.    Micheline Chapman FNP   12/02/2015, 3:09 PM

## 2015-12-02 NOTE — Patient Instructions (Signed)
Continue current treatment. 

## 2015-12-02 NOTE — Progress Notes (Signed)
Patient is here for 3 month FU  Patient complains of chest pain being present for 10 days.  Patient has taken medication today. Patient has not eaten.

## 2016-01-09 MED FILL — MONTELUKAST SOD 10 MG TAB: 10 | 30 days supply | Qty: 30 | Fill #1

## 2016-01-09 MED FILL — METOPROLOL TARTRATE 25 MG T: 25 | 30 days supply | Qty: 60 | Fill #1

## 2016-01-09 MED FILL — FOLIC ACID 1 MG TABLET: 1 | 30 days supply | Qty: 30 | Fill #1

## 2016-01-09 MED FILL — SULFAMETHOXAZOLE/TMP DS TAB: 800-160 | 3 days supply | Qty: 6 | Fill #0

## 2016-01-09 MED FILL — FLUTICASONE PROP 50 MCG SPR: 50 | 30 days supply | Qty: 16 | Fill #1

## 2016-01-09 MED FILL — BREO ELLIPTA 100-25 MCG INH: 100-25 | 20 days supply | Qty: 60 | Fill #1

## 2016-02-16 ENCOUNTER — Ambulatory Visit: Payer: No Typology Code available for payment source | Attending: Internal Medicine

## 2016-03-01 MED FILL — BREO ELLIPTA 100-25 MCG INH: 100-25 | 20 days supply | Qty: 60 | Fill #2

## 2016-03-01 MED FILL — FLUTICASONE PROP 50 MCG SPR: 50 | 30 days supply | Qty: 16 | Fill #2

## 2016-03-01 MED FILL — PANTOPRAZOLE SOD DR 20 MG T: 20 | 30 days supply | Qty: 30 | Fill #4

## 2016-03-01 MED FILL — NAPROXEN 500 MG TABLET: 500 | 60 days supply | Qty: 60 | Fill #1

## 2016-03-01 MED FILL — METOPROLOL TARTRATE 25 MG T: 25 | 30 days supply | Qty: 60 | Fill #2

## 2016-03-01 MED FILL — FOLIC ACID 1 MG TABLET: 1 | 30 days supply | Qty: 30 | Fill #2

## 2016-03-01 MED FILL — ?MONTELUKAST SOD 10 MG TAB: 10 MG | 30 days supply | Qty: 30 | Fill #2

## 2016-03-09 ENCOUNTER — Other Ambulatory Visit: Payer: Self-pay

## 2016-03-09 MED ORDER — FLUTICASONE FUROATE-VILANTEROL 100-25 MCG/INH IN AEPB: INHALATION_SPRAY | RESPIRATORY_TRACT | 3 refills | Status: DC

## 2016-03-09 NOTE — Telephone Encounter (Signed)
Printed script for pass. 

## 2016-03-17 ENCOUNTER — Ambulatory Visit: Payer: Medicaid Other | Attending: Internal Medicine

## 2016-03-24 ENCOUNTER — Encounter (HOSPITAL_COMMUNITY): Payer: Self-pay | Admitting: Emergency Medicine

## 2016-03-24 ENCOUNTER — Ambulatory Visit (HOSPITAL_COMMUNITY)
Admission: EM | Admit: 2016-03-24 | Discharge: 2016-03-24 | Disposition: A | Discharge status: Home / Self Care | Payer: Medicaid Other | Attending: Family Medicine | Admitting: Family Medicine

## 2016-03-24 DIAGNOSIS — S60221A Contusion of right hand, initial encounter: Secondary | ICD-10-CM

## 2016-03-24 DIAGNOSIS — J40 Bronchitis, not specified as acute or chronic: Secondary | ICD-10-CM

## 2016-03-24 MED ORDER — HYDROCODONE-HOMATROPINE 5-1.5 MG/5ML PO SYRP: 5.0000 mL | ORAL_SOLUTION | Freq: Four times a day (QID) | ORAL | 0 refills | Status: DC | PRN

## 2016-03-24 MED ORDER — AZITHROMYCIN 250 MG PO TABS: 250.0000 mg | ORAL_TABLET | Freq: Every day | ORAL | 0 refills | Status: DC

## 2016-03-24 MED FILL — ?AZITHROMYCIN 250 MG TABLET: 250 | 5 days supply | Qty: 6 | Fill #0

## 2016-03-24 NOTE — ED Triage Notes (Signed)
Pt has been suffering from a cold for several days.  About three days ago she began to feel worse with nasal congestion and drainage, right ear pain, neck/gland pain, sore throat, cough, and dizziness. Temperature has not been measured at home.

## 2016-03-24 NOTE — ED Provider Notes (Signed)
[facility]    CSN: [id number] Arrival date & time: 03/24/16  1024     History   Chief Complaint Chief Complaint  Patient presents with  . URI    HPI Betty Cain is a 40 y.o. female.   This is a 40 year old woman, originally from [place], who comes in with cough that began 3 days ago. It's progressively gotten worse and she's had diffuse myalgias. In addition, she's noted right year pain for the last 24 hours.  Patient has a separate problem began yesterday with her right palm becoming sore over the thenar eminence associated with some ecchymosis. She denies having any trauma to that area nor did she burned it.      Past Medical History:  Diagnosis Date  . Anemia   . IUD (intrauterine device) in place 09/03/2011   Due out in 2016-08-24  . Neonatal death    Vaginal delivery, full term-lived x2 hours.   . Palpitations   . Shortness of breath   . Spinal headache   . Valvular heart disease     Patient Active Problem List   Diagnosis Date Noted  . Vertigo 07/22/2015  . Abdominal discomfort 02/14/2015  . Low back pain 02/14/2015  . Generalized abdominal pain 02/01/2015  . Nausea 02/01/2015  . Environmental allergies 02/01/2015  . Language barrier to communication 02/01/2015  . Anemia 01/23/2015  . Prediabetes 01/23/2015  . Constipation 01/21/2015  . Knee pain, bilateral 01/21/2015  . Previous cesarean delivery affecting pregnancy 12/11/2013  . Palpitations 09/18/2013  . Valvular heart disease 09/18/2013  . Shortness of breath 09/18/2013  . Previous cesarean delivery, delivered, with or without mention of antepartum condition 06/19/2013  . Advanced maternal age in pregnancy in second trimester 06/19/2013    Past Surgical History:  Procedure Laterality Date  . ADENOIDECTOMY     as a child  . boil  2003   right elbow  . CESAREAN SECTION    . CESAREAN SECTION  06/02/2011   Procedure: CESAREAN SECTION;  Surgeon: [name], MD;  Location: [facility];   Service: Gynecology;  Laterality: N/A;  Primary Cesarean Section Delivery Baby Boy @ 0004, Apgars 9/9  . CESAREAN SECTION N/A 12/10/2013   Procedure: REPEAT CESAREAN SECTION;  Surgeon: [name], MD;  Location: [facility];  Service: Obstetrics;  Laterality: N/A;    OB History    Gravida Para Term Preterm AB Living   5 5 5     4    SAB TAB Ectopic Multiple Live Births           5       Home Medications    Prior to Admission medications   Medication Sig Start Date End Date Taking? Authorizing Provider  metoprolol tartrate (LOPRESSOR) 25 MG tablet Take 1 tablet (25 mg total) by mouth 2 (two) times daily. 12/02/15  Yes Micheline Chapman, NP  acetaminophen (TYLENOL) 500 MG tablet Take 1 tablet (500 mg total) by mouth every 6 (six) hours as needed. 01/21/15   Dorena Dew, FNP  azithromycin (ZITHROMAX) 250 MG tablet Take 1 tablet (250 mg total) by mouth daily. Take first 2 tablets together, then 1 every day until finished. 03/24/16   Robyn Haber, MD  HYDROcodone-homatropine [facility]) 5-1.5 MG/5ML syrup Take 5 mLs by mouth every 6 (six) hours as needed for cough. 03/24/16   Robyn Haber, MD  lubiprostone (AMITIZA) 8 MCG capsule Take 1 capsule (8 mcg total) by mouth daily with breakfast. 12/02/15  Micheline Chapman, NP  montelukast (SINGULAIR) 10 MG tablet Take 1 tablet (10 mg total) by mouth at bedtime. 12/02/15   Micheline Chapman, NP  naproxen (NAPROSYN) 500 MG tablet Take 1 tablet (500 mg total) by mouth 2 (two) times daily with a meal. 12/02/15   Micheline Chapman, NP    Family History Family History  Problem Relation Age of Onset  . Diabetes Mother   . Diabetes Father   . Diabetes Sister   . Anesthesia problems Neg Hx     Social History Social History  Substance Use Topics  . Smoking status: Never Smoker  . Smokeless tobacco: Never Used  . Alcohol use No     Allergies   Patient has no known allergies.   Review of Systems Review of Systems  Constitutional:  Positive for fever.  HENT: Positive for congestion.   Respiratory: Positive for cough.   Genitourinary: Negative.   Musculoskeletal: Positive for joint swelling and myalgias.     Physical Exam Triage Vital Signs ED Triage Vitals  Enc Vitals Group     BP 03/24/16 1054 130/79     Pulse Rate 03/24/16 1054 110     Resp --      Temp 03/24/16 1054 99 F (37.2 C)     Temp Source 03/24/16 1054 Oral     SpO2 03/24/16 1054 100 %     Weight --      Height --      Head Circumference --      Peak Flow --      Pain Score 03/24/16 1053 8     Pain Loc --      Pain Edu? --      Excl. in [place]? --    No data found.   Updated Vital Signs BP 130/79 (BP Location: Left Arm)   Pulse 110   Temp 99 F (37.2 C) (Oral)   SpO2 100%    Physical Exam  Constitutional: She is oriented to person, place, and time. She appears well-developed and well-nourished.  HENT:  Right Ear: External ear normal.  Left Ear: External ear normal.  Mouth/Throat: Oropharynx is clear and moist.  Opaque right TM with some bulging and loss of landmarks  Eyes: Conjunctivae and EOM are normal.  Neck: Normal range of motion. Neck supple.  Cardiovascular: Regular rhythm and normal heart sounds.   Pulmonary/Chest: Effort normal. She has rales.  Musculoskeletal: Normal range of motion.  1-1/2 cm area of ecchymosis on the thenar eminence of the palm on the right side which is mildly tender. The skin is intact and range of motion is normal  Neurological: She is alert and oriented to person, place, and time.  Skin: Skin is warm and dry.  Nursing note and vitals reviewed.    UC Treatments / Results  Labs (all labs ordered are listed, but only abnormal results are displayed) Labs Reviewed - No data to display  EKG  EKG Interpretation None       Radiology No results found.  Procedures Procedures (including critical care time)  Medications Ordered in UC Medications - No data to display   Initial Impression  / Assessment and Plan / UC Course  I have reviewed the triage vital signs and the nursing notes.  Pertinent labs & imaging results that were available during my care of the patient were reviewed by me and considered in my medical decision making (see chart for details).  Clinical Course  Final Clinical Impressions(s) / UC Diagnoses   Final diagnoses:  Bronchitis  Traumatic ecchymosis of right hand, initial encounter    New Prescriptions New Prescriptions   AZITHROMYCIN (ZITHROMAX) 250 MG TABLET    Take 1 tablet (250 mg total) by mouth daily. Take first 2 tablets together, then 1 every day until finished.   HYDROCODONE-HOMATROPINE (HYCODAN) 5-1.5 MG/5ML SYRUP    Take 5 mLs by mouth every 6 (six) hours as needed for cough.     Robyn Haber, MD 03/24/16 1113

## 2016-03-31 ENCOUNTER — Encounter: Payer: Self-pay | Admitting: Family Medicine

## 2016-03-31 ENCOUNTER — Ambulatory Visit (INDEPENDENT_AMBULATORY_CARE_PROVIDER_SITE_OTHER): Payer: No Typology Code available for payment source | Admitting: Family Medicine

## 2016-03-31 VITALS — BP 121/73 | HR 101 | Temp 98.4°F | Resp 16 | Ht <= 58 in | Wt 146.0 lb

## 2016-03-31 DIAGNOSIS — R059 Cough, unspecified: Secondary | ICD-10-CM

## 2016-03-31 DIAGNOSIS — R05 Cough: Secondary | ICD-10-CM

## 2016-03-31 DIAGNOSIS — J069 Acute upper respiratory infection, unspecified: Secondary | ICD-10-CM

## 2016-03-31 DIAGNOSIS — R0982 Postnasal drip: Secondary | ICD-10-CM

## 2016-03-31 DIAGNOSIS — J321 Chronic frontal sinusitis: Secondary | ICD-10-CM

## 2016-03-31 MED ORDER — AMOXICILLIN-POT CLAVULANATE 875-125 MG PO TABS: 1.0000 | ORAL_TABLET | Freq: Two times a day (BID) | ORAL | 0 refills | Status: DC

## 2016-03-31 MED ORDER — GUAIFENESIN ER 600 MG PO TB12: 600.0000 mg | ORAL_TABLET | Freq: Two times a day (BID) | ORAL | 0 refills | Status: DC

## 2016-03-31 MED FILL — AMOX-CLAV 875-125 MG TABLET: 875-125 | 10 days supply | Qty: 20 | Fill #0

## 2016-03-31 NOTE — Progress Notes (Signed)
Subjective:    Patient ID: Betty Cain, female    DOB: 1976-04-12, 40 y.o.   MRN: [id number]  Cough  This is a recurrent problem. The current episode started 1 to 4 weeks ago. The problem has been unchanged. The problem occurs constantly. The cough is productive of purulent sputum. Associated symptoms include ear congestion, ear pain, headaches, nasal congestion and postnasal drip. Pertinent negatives include no chest pain, fever, sweats, weight loss or wheezing. She has tried prescription cough suppressant and rest for the symptoms. The treatment provided mild relief. Her past medical history is significant for bronchitis.  URI   This is a new problem. The current episode started 1 to 4 weeks ago. The problem has been unchanged. There has been no fever. Associated symptoms include coughing, ear pain and headaches. Pertinent negatives include no chest pain or wheezing. She has tried decongestant, increased fluids and sleep for the symptoms. The treatment provided mild relief.   Past Medical History:  Diagnosis Date  . Anemia   . IUD (intrauterine device) in place 09/03/2011   Due out in 09/06/16  . Neonatal death    Vaginal delivery, full term-lived x2 hours.   . Palpitations   . Shortness of breath   . Spinal headache   . Valvular heart disease    Social History   Social History Narrative  . No narrative on file  No Known Allergies  Immunization History  Administered Date(s) Administered  . Influenza,inj,Quad PF,36+ Mos 06/19/2013, 12/02/2015  . Tdap 09/13/2013, 12/11/2013     Review of Systems  Constitutional: Negative for fever and weight loss.  HENT: Positive for ear pain and postnasal drip.   Eyes: Positive for pain.  Respiratory: Positive for cough. Negative for wheezing.   Cardiovascular: Negative.  Negative for chest pain.  Gastrointestinal: Negative.   Endocrine: Negative.   Musculoskeletal: Negative.   Skin: Negative.   Allergic/Immunologic: Negative.  Negative  for immunocompromised state.  Neurological: Positive for headaches.  Hematological: Negative.   Psychiatric/Behavioral: Negative.        Objective:   Physical Exam  Constitutional: She is oriented to person, place, and time. She appears well-developed and well-nourished. She appears ill.  HENT:  Head: Normocephalic and atraumatic.  Right Ear: Hearing, external ear and ear canal normal. Tympanic membrane is erythematous.  Left Ear: Hearing, external ear and ear canal normal. Tympanic membrane is erythematous.  Nose: Mucosal edema present. Right sinus exhibits frontal sinus tenderness. Left sinus exhibits frontal sinus tenderness.  Mouth/Throat: Oropharyngeal exudate present.  Eyes: Conjunctivae and EOM are normal. Pupils are equal, round, and reactive to light.  Neck: Normal range of motion. Neck supple.  Cardiovascular: Normal rate, regular rhythm, normal heart sounds and intact distal pulses.   Abdominal: Soft. Bowel sounds are normal.  Musculoskeletal: Normal range of motion.  Lymphadenopathy:       Head (right side): Submandibular adenopathy present.       Head (left side): Submandibular adenopathy present.    She has no cervical adenopathy.  Neurological: She is alert and oriented to person, place, and time. She has normal reflexes.  Skin: Skin is warm and dry.  Psychiatric: She has a normal mood and affect. Her behavior is normal. Judgment and thought content normal.     BP 121/73 (BP Location: Right Arm, Patient Position: Sitting, Cuff Size: Normal)   Pulse (!) 101   Temp 98.4 F (36.9 C) (Oral)   Resp 16   Ht 4\' 10"  (1.473 m)  Wt 146 lb (66.2 kg)   SpO2 100%   BMI 30.51 kg/m  Assessment & Plan:  1. Acute upper respiratory infection Increase rest, handwashing, Vitamin C, and water intake.  - amoxicillin-clavulanate (AUGMENTIN) 875-125 MG tablet; Take 1 tablet by mouth 2 (two) times daily.  Dispense: 20 tablet; Refill: 0 - guaiFENesin (MUCINEX) 600 MG 12 hr tablet;  Take 1 tablet (600 mg total) by mouth 2 (two) times daily.  Dispense: 20 tablet; Refill: 0 - amoxicillin-clavulanate (AUGMENTIN) 875-125 MG tablet; Take 1 tablet by mouth 2 (two) times daily.  Dispense: 20 tablet; Refill: 0 - guaiFENesin (MUCINEX) 600 MG 12 hr tablet; Take 1 tablet (600 mg total) by mouth 2 (two) times daily.  Dispense: 20 tablet; Refill: 0  3. Post-nasal drip  - guaiFENesin (MUCINEX) 600 MG 12 hr tablet; Take 1 tablet (600 mg total) by mouth 2 (two) times daily.  Dispense: 20 tablet; Refill: 0  4. Frontal sinusitis, unspecified chronicity  - amoxicillin-clavulanate (AUGMENTIN) 875-125 MG tablet; Take 1 tablet by mouth 2 (two) times daily.  Dispense: 20 tablet; Refill: 0  RTC: As previously scheduled  Dorena Dew, FNP

## 2016-03-31 NOTE — Patient Instructions (Addendum)
Cough, Adult Introduction A cough helps to clear your throat and lungs. A cough may last only 2-3 weeks (acute), or it may last longer than 8 weeks (chronic). Many different things can cause a cough. A cough may be a sign of an illness or another medical condition. Follow these instructions at home:  Pay attention to any changes in your cough.  Take medicines only as told by your doctor.  If you were prescribed an antibiotic medicine, take it as told by your doctor. Do not stop taking it even if you start to feel better.  Talk with your doctor before you try using a cough medicine.  Drink enough fluid to keep your pee (urine) clear or pale yellow.  If the air is dry, use a cold steam vaporizer or humidifier in your home.  Stay away from things that make you cough at work or at home.  If your cough is worse at night, try using extra pillows to raise your head up higher while you sleep.  Do not smoke, and try not to be around smoke. If you need help quitting, ask your doctor.  Do not have caffeine.  Do not drink alcohol.  Rest as needed. Contact a doctor if:  You have new problems (symptoms).  You cough up yellow fluid (pus).  Your cough does not get better after 2-3 weeks, or your cough gets worse.  Medicine does not help your cough and you are not sleeping well.  You have pain that gets worse or pain that is not helped with medicine.  You have a fever.  You are losing weight and you do not know why.  You have night sweats. Get help right away if:  You cough up blood.  You have trouble breathing.  Your heartbeat is very fast. This information is not intended to replace advice given to you by your health care provider. Make sure you discuss any questions you have with your health care provider. Document Released: 12/10/2010 Document Revised: 09/04/2015 Document Reviewed: 06/05/2014  2017 Elsevier  Upper Respiratory Infection, Adult Most upper respiratory  infections (URIs) are caused by a virus. A URI affects the nose, throat, and upper air passages. The most common type of URI is often called "the common cold." Follow these instructions at home:  Take medicines only as told by your doctor.  Gargle warm saltwater or take cough drops to comfort your throat as told by your doctor.  Use a warm mist humidifier or inhale steam from a shower to increase air moisture. This may make it easier to breathe.  Drink enough fluid to keep your pee (urine) clear or pale yellow.  Eat soups and other clear broths.  Have a healthy diet.  Rest as needed.  Go back to work when your fever is gone or your doctor says it is okay.  You may need to stay home longer to avoid giving your URI to others.  You can also wear a face mask and wash your hands often to prevent spread of the virus.  Use your inhaler more if you have asthma.  Do not use any tobacco products, including cigarettes, chewing tobacco, or electronic cigarettes. If you need help quitting, ask your doctor. Contact a doctor if:  You are getting worse, not better.  Your symptoms are not helped by medicine.  You have chills.  You are getting more short of breath.  You have brown or red mucus.  You have yellow or brown discharge from  your nose.  You have pain in your face, especially when you bend forward.  You have a fever.  You have puffy (swollen) neck glands.  You have pain while swallowing.  You have white areas in the back of your throat. Get help right away if:  You have very bad or constant:  Headache.  Ear pain.  Pain in your forehead, behind your eyes, and over your cheekbones (sinus pain).  Chest pain.  You have long-lasting (chronic) lung disease and any of the following:  Wheezing.  Long-lasting cough.  Coughing up blood.  A change in your usual mucus.  You have a stiff neck.  You have changes in  your:  Vision.  Hearing.  Thinking.  Mood. This information is not intended to replace advice given to you by your health care provider. Make sure you discuss any questions you have with your health care provider. Document Released: 09/15/2007 Document Revised: 11/30/2015 Document Reviewed: 07/04/2013 Elsevier Interactive Patient Education  2017 [employer].

## 2016-03-31 NOTE — Progress Notes (Deleted)
   Subjective:    Patient ID: Betty Cain, female    DOB: [birth year], 40 y.o.   MRN: [id number]  Cough  This is a recurrent problem. The problem has been gradually worsening. The cough is productive of purulent sputum. Associated symptoms include ear congestion and ear pain. There is no history of asthma, bronchiectasis, bronchitis, COPD, emphysema, environmental allergies or pneumonia.  Eye Pain    URI   Associated symptoms include coughing and ear pain.   Past Medical History:  Diagnosis Date  . Anemia   . IUD (intrauterine device) in place 09/03/2011   Due out in 08-20-2016  . Neonatal death    Vaginal delivery, full term-lived x2 hours.   . Palpitations   . Shortness of breath   . Spinal headache   . Valvular heart disease    Social History   Social History  . Marital status: Married    Spouse name: N/A  . Number of children: N/A  . Years of education: N/A   Occupational History  . Not on file.   Social History Main Topics  . Smoking status: Never Smoker  . Smokeless tobacco: Never Used  . Alcohol use No  . Drug use: No  . Sexual activity: Yes    Birth control/ protection: None     Comment: pregnant   Other Topics Concern  . Not on file   Social History Narrative  . No narrative on file     Review of Systems  HENT: Positive for ear pain.   Eyes: Positive for pain.  Respiratory: Positive for cough.   Allergic/Immunologic: Negative for environmental allergies.       Objective:   Physical Exam        Assessment & Plan:

## 2016-05-07 ENCOUNTER — Encounter (HOSPITAL_COMMUNITY): Payer: Self-pay

## 2016-05-07 ENCOUNTER — Ambulatory Visit (HOSPITAL_COMMUNITY)
Admission: EM | Admit: 2016-05-07 | Discharge: 2016-05-07 | Disposition: A | Discharge status: Home / Self Care | Payer: No Typology Code available for payment source | Attending: Family Medicine | Admitting: Family Medicine

## 2016-05-07 DIAGNOSIS — J111 Influenza due to unidentified influenza virus with other respiratory manifestations: Secondary | ICD-10-CM

## 2016-05-07 DIAGNOSIS — R69 Illness, unspecified: Secondary | ICD-10-CM

## 2016-05-07 MED ORDER — HYDROCOD POLST-CPM POLST ER 10-8 MG/5ML PO SUER: 5.0000 mL | Freq: Two times a day (BID) | ORAL | 0 refills | Status: DC | PRN

## 2016-05-07 MED ORDER — IPRATROPIUM BROMIDE 0.06 % NA SOLN: 2.0000 | Freq: Four times a day (QID) | NASAL | 1 refills | Status: DC

## 2016-05-07 MED FILL — IPRATROPIUM 0.06% SPRAY: 0.06 | 21 days supply | Qty: 15 | Fill #0

## 2016-05-07 NOTE — ED Triage Notes (Signed)
Pt said for 1 week she has had cough, bilateral ear pain, runny eyes, chest congestion, headache, facial swelling and dizziness. No fever, but feels warm. Taking tylenol.

## 2016-05-07 NOTE — ED Provider Notes (Signed)
[name]    CSN: [id number] Arrival date & time: 05/07/16  1010     History   Chief Complaint Chief Complaint  Patient presents with  . Cough    HPI Betty Cain is a 41 y.o. female.   The history is provided by the patient.  Cough  Cough characteristics:  Non-productive Severity:  Mild Onset quality:  Gradual Duration:  1 week Progression:  Unchanged Chronicity:  New Smoker: no   Context: upper respiratory infection and weather changes   Associated symptoms: chills, fever, myalgias, rhinorrhea and sinus congestion     Past Medical History:  Diagnosis Date  . Anemia   . IUD (intrauterine device) in place 09/03/2011   Due out in 08-20-2016  . Neonatal death    Vaginal delivery, full term-lived x2 hours.   . Palpitations   . Shortness of breath   . Spinal headache   . Valvular heart disease     Patient Active Problem List   Diagnosis Date Noted  . Vertigo 07/22/2015  . Abdominal discomfort 02/14/2015  . Low back pain 02/14/2015  . Generalized abdominal pain 02/01/2015  . Nausea 02/01/2015  . Environmental allergies 02/01/2015  . Language barrier to communication 02/01/2015  . Anemia 01/23/2015  . Prediabetes 01/23/2015  . Constipation 01/21/2015  . Knee pain, bilateral 01/21/2015  . Previous cesarean delivery affecting pregnancy 12/11/2013  . Palpitations 09/18/2013  . Valvular heart disease 09/18/2013  . Shortness of breath 09/18/2013  . Previous cesarean delivery, delivered, with or without mention of antepartum condition 06/19/2013  . Advanced maternal age in pregnancy in second trimester 06/19/2013    Past Surgical History:  Procedure Laterality Date  . ADENOIDECTOMY     as a child  . boil  2003   right elbow  . CESAREAN SECTION    . CESAREAN SECTION  06/02/2011   Procedure: CESAREAN SECTION;  Surgeon: [name], MD;  Location: [facility];  Service: Gynecology;  Laterality: N/A;  Primary Cesarean Section Delivery Baby Boy @ 0004,  Apgars 9/9  . CESAREAN SECTION N/A 12/10/2013   Procedure: REPEAT CESAREAN SECTION;  Surgeon: [name], MD;  Location: [facility];  Service: Obstetrics;  Laterality: N/A;    OB History    Gravida Para Term Preterm AB Living   5 5 5     4    SAB TAB Ectopic Multiple Live Births           5       Home Medications    Prior to Admission medications   Medication Sig Start Date End Date Taking? Authorizing Provider  acetaminophen (TYLENOL) 500 MG tablet Take 1 tablet (500 mg total) by mouth every 6 (six) hours as needed. 01/21/15   Dorena Dew, FNP  amoxicillin-clavulanate (AUGMENTIN) 875-125 MG tablet Take 1 tablet by mouth 2 (two) times daily. 03/31/16   Dorena Dew, FNP  guaiFENesin (MUCINEX) 600 MG 12 hr tablet Take 1 tablet (600 mg total) by mouth 2 (two) times daily. 03/31/16   Dorena Dew, FNP  HYDROcodone-homatropine (HYCODAN) 5-1.5 MG/5ML syrup Take 5 mLs by mouth every 6 (six) hours as needed for cough. 03/24/16   Robyn Haber, MD  lubiprostone (AMITIZA) 8 MCG capsule Take 1 capsule (8 mcg total) by mouth daily with breakfast. 12/02/15   Micheline Chapman, NP  metoprolol tartrate (LOPRESSOR) 25 MG tablet Take 1 tablet (25 mg total) by mouth 2 (two) times daily. 12/02/15   Micheline Chapman, NP  montelukast (SINGULAIR) 10 MG tablet Take 1 tablet (10 mg total) by mouth at bedtime. 12/02/15   Micheline Chapman, NP  naproxen (NAPROSYN) 500 MG tablet Take 1 tablet (500 mg total) by mouth 2 (two) times daily with a meal. 12/02/15   Micheline Chapman, NP    Family History Family History  Problem Relation Age of Onset  . Diabetes Mother   . Diabetes Father   . Diabetes Sister   . Anesthesia problems Neg Hx     Social History Social History  Substance Use Topics  . Smoking status: Never Smoker  . Smokeless tobacco: Never Used  . Alcohol use No     Allergies   Patient has no known allergies.   Review of Systems Review of Systems  Constitutional: Positive  for activity change, appetite change, chills and fever.  HENT: Positive for congestion, postnasal drip and rhinorrhea.   Respiratory: Positive for cough.   Cardiovascular: Negative.   Genitourinary: Negative.   Musculoskeletal: Positive for myalgias.  All other systems reviewed and are negative.    Physical Exam Triage Vital Signs ED Triage Vitals [05/07/16 1051]  Enc Vitals Group     BP 123/82     Pulse Rate 97     Resp 20     Temp 98.8 F (37.1 C)     Temp Source Oral     SpO2 100 %     Weight      Height      Head Circumference      Peak Flow      Pain Score 9     Pain Loc      Pain Edu?      Excl. in [place]?    No data found.   Updated Vital Signs BP 123/82 (BP Location: Left Arm)   Pulse 97   Temp 98.8 F (37.1 C) (Oral)   Resp 20   SpO2 100%   Breastfeeding? No   Visual Acuity Right Eye Distance:   Left Eye Distance:   Bilateral Distance:    Right Eye Near:   Left Eye Near:    Bilateral Near:     Physical Exam  Constitutional: She is oriented to person, place, and time. She appears well-developed and well-nourished. No distress.  HENT:  Right Ear: External ear normal.  Left Ear: External ear normal.  Nose: Nose normal.  Mouth/Throat: Oropharynx is clear and moist.  Eyes: Pupils are equal, round, and reactive to light.  Neck: Normal range of motion.  Cardiovascular: Normal rate, regular rhythm, normal heart sounds and intact distal pulses.   Pulmonary/Chest: Effort normal and breath sounds normal.  Lymphadenopathy:    She has no cervical adenopathy.  Neurological: She is alert and oriented to person, place, and time.  Skin: Skin is warm and dry.  Nursing note and vitals reviewed.    UC Treatments / Results  Labs (all labs ordered are listed, but only abnormal results are displayed) Labs Reviewed - No data to display  EKG  EKG Interpretation None       Radiology No results found.  Procedures Procedures (including critical care  time)  Medications Ordered in UC Medications - No data to display   Initial Impression / Assessment and Plan / UC Course  I have reviewed the triage vital signs and the nursing notes.  Pertinent labs & imaging results that were available during my care of the patient were reviewed by me and considered in my medical decision making (  see chart for details).       Final Clinical Impressions(s) / UC Diagnoses   Final diagnoses:  None    New Prescriptions New Prescriptions   No medications on file     Billy Fischer, MD 05/07/16 1138

## 2016-06-03 ENCOUNTER — Encounter: Payer: Self-pay | Admitting: Family Medicine

## 2016-06-03 ENCOUNTER — Other Ambulatory Visit: Payer: Self-pay | Admitting: Family Medicine

## 2016-06-03 ENCOUNTER — Ambulatory Visit (INDEPENDENT_AMBULATORY_CARE_PROVIDER_SITE_OTHER): Payer: No Typology Code available for payment source | Admitting: Family Medicine

## 2016-06-03 VITALS — BP 116/71 | HR 102 | Temp 98.5°F | Resp 16 | Ht <= 58 in | Wt 149.0 lb

## 2016-06-03 DIAGNOSIS — R002 Palpitations: Secondary | ICD-10-CM

## 2016-06-03 DIAGNOSIS — R7303 Prediabetes: Secondary | ICD-10-CM

## 2016-06-03 DIAGNOSIS — R350 Frequency of micturition: Secondary | ICD-10-CM

## 2016-06-03 DIAGNOSIS — M25569 Pain in unspecified knee: Secondary | ICD-10-CM

## 2016-06-03 DIAGNOSIS — R3915 Urgency of urination: Secondary | ICD-10-CM

## 2016-06-03 DIAGNOSIS — M549 Dorsalgia, unspecified: Secondary | ICD-10-CM

## 2016-06-03 DIAGNOSIS — Z789 Other specified health status: Secondary | ICD-10-CM

## 2016-06-03 LAB — BASIC METABOLIC PANEL
BUN: 12 mg/dL (ref 7–25)
CALCIUM: 9.5 mg/dL (ref 8.6–10.2)
CO2: 22 mmol/L (ref 20–31)
CREATININE: 0.59 mg/dL (ref 0.50–1.10)
Chloride: 106 mmol/L (ref 98–110)
Glucose, Bld: 113 mg/dL — ABNORMAL HIGH (ref 65–99)
Potassium: 4.2 mmol/L (ref 3.5–5.3)
Sodium: 139 mmol/L (ref 135–146)

## 2016-06-03 LAB — POCT GLYCOSYLATED HEMOGLOBIN (HGB A1C): HEMOGLOBIN A1C: 5.6

## 2016-06-03 MED FILL — ?METOPROLOL 25 MG TABLET: 25 | 30 days supply | Qty: 60 | Fill #2

## 2016-06-03 MED FILL — !BREO ELLIPTA 100-25 MCG IN: 100-25 | 20 days supply | Qty: 60 | Fill #3

## 2016-06-03 MED FILL — MONTELUKAST SOD 10 MG TAB: 10 | 30 days supply | Qty: 30 | Fill #3

## 2016-06-03 MED FILL — FOLIC ACID 1 MG TABLET: 1 | 30 days supply | Qty: 30 | Fill #3

## 2016-06-03 MED FILL — FLUTICASONE PROP 50 MCG SPR: 50 | 30 days supply | Qty: 16 | Fill #3

## 2016-06-03 MED FILL — IPRATROPIUM 0.06% SPRAY: 0.06 | 21 days supply | Qty: 15 | Fill #1

## 2016-06-03 NOTE — Progress Notes (Signed)
Subjective:    Patient ID: Betty Cain, female    DOB: [birth year], 41 y.o.   MRN: [id number]  Dysuria   This is a new problem. The current episode started in the past 7 days. The problem occurs intermittently. The quality of the pain is described as aching. The pain is at a severity of 4/10. The pain is mild. There has been no fever. She is sexually active. There is no history of pyelonephritis. Associated symptoms include urgency. Pertinent negatives include no chills, discharge, flank pain, frequency, hematuria, hesitancy, nausea, possible pregnancy or vomiting. She has tried increased fluids for the symptoms. The treatment provided no relief. Her past medical history is significant for urinary stasis. There is no history of kidney stones or a urological procedure.  Palpitations   This is a chronic problem. The current episode started more than 1 year ago. The problem occurs daily. The symptoms are aggravated by exercise and stress. Associated symptoms include an irregular heartbeat. Pertinent negatives include no anxiety, chest fullness, chest pain, coughing, diaphoresis, dizziness, fever, malaise/fatigue, nausea, near-syncope, numbness, shortness of breath, syncope, vomiting or weakness. She has tried beta blockers for the symptoms. The treatment provided mild relief. Risk factors include sedentary lifestyle. There is no history of drug use, heart disease, hyperthyroidism or a valve disorder.   Past Medical History:  Diagnosis Date  . Anemia   . IUD (intrauterine device) in place 09/03/2011   Due out in 2016-08-31  . Neonatal death    Vaginal delivery, full term-lived x2 hours.   . Palpitations   . Shortness of breath   . Spinal headache   . Valvular heart disease    Social History   Social History  . Marital status: Married    Spouse name: N/A  . Number of children: N/A  . Years of education: N/A   Occupational History  . Not on file.   Social History Main Topics  . Smoking  status: Never Smoker  . Smokeless tobacco: Never Used  . Alcohol use No  . Drug use: No  . Sexual activity: Yes    Birth control/ protection: None     Comment: pregnant   Other Topics Concern  . Not on file   Social History Narrative  . No narrative on file   Immunization History  Administered Date(s) Administered  . Influenza,inj,Quad PF,36+ Mos 06/19/2013, 12/02/2015  . Tdap 09/13/2013, 12/11/2013   Review of Systems  Constitutional: Negative for chills, diaphoresis, fever and malaise/fatigue.  Respiratory: Negative for cough and shortness of breath.   Cardiovascular: Positive for palpitations. Negative for chest pain, syncope and near-syncope.  Gastrointestinal: Negative for nausea and vomiting.  Genitourinary: Positive for dysuria and urgency. Negative for flank pain, frequency, hematuria and hesitancy.  Neurological: Negative for dizziness, weakness and numbness.  Psychiatric/Behavioral: The patient is not nervous/anxious.        Objective:   Physical Exam  Constitutional: She is oriented to person, place, and time. She appears well-developed.  HENT:  Head: Normocephalic and atraumatic.  Right Ear: External ear normal.  Left Ear: External ear normal.  Mouth/Throat: Oropharynx is clear and moist.  Eyes: Conjunctivae and EOM are normal. Pupils are equal, round, and reactive to light.  Neck: Normal range of motion. Neck supple.  Cardiovascular: Normal heart sounds and intact distal pulses.  An irregular rhythm present. Tachycardia present.   Pulmonary/Chest: Effort normal and breath sounds normal.  Abdominal: There is tenderness. There is CVA tenderness.  Musculoskeletal: Normal range of motion.  Neurological: She is alert and oriented to person, place, and time. She has normal reflexes.  Skin: Skin is warm and dry.  Psychiatric: She has a normal mood and affect. Her behavior is normal. Thought content normal.      BP 116/71 (BP Location: Right Arm, Patient  Position: Sitting, Cuff Size: Normal)   Pulse (!) 102   Temp 98.5 F (36.9 C) (Oral)   Resp 16   Ht 4\' 10"  (1.473 m)   Wt 149 lb (67.6 kg)   SpO2 100%   BMI 31.14 kg/m  Assessment & Plan:  1. Heart palpitations Continue Metoprolol 25 mg as previously prescribed.  - EKG 12-Lead   2. Prediabetes Recommend a lowfat, low carbohydrate diet divided over 5-6 small meals, increase water intake to 6-8 glasses, and 150 minutes per week of cardiovascular exercise.   - HgB A1c  3. Urinary frequency - Urine culture  4. Urinary urgency  - Urine culture  5. CVA tenderness  - Basic Metabolic Panel - Urine culture  6. Language barrier to communication Used video interpreter to assist with communicaiton   Dorena Dew, FNP

## 2016-06-04 LAB — POCT URINALYSIS DIP (DEVICE)
Bilirubin Urine: NEGATIVE
GLUCOSE, UA: NEGATIVE mg/dL
KETONES UR: NEGATIVE mg/dL
Leukocytes, UA: NEGATIVE
Nitrite: NEGATIVE
PH: 7.5 (ref 5.0–8.0)
Protein, ur: NEGATIVE mg/dL
SPECIFIC GRAVITY, URINE: 1.015 (ref 1.005–1.030)
Urobilinogen, UA: 0.2 mg/dL (ref 0.0–1.0)

## 2016-06-04 MED FILL — PANTOPRAZOLE SOD DR 20 MG T: 20 | 30 days supply | Qty: 30 | Fill #0

## 2016-06-04 MED FILL — NAPROXEN 500 MG TABLET: 500 | 30 days supply | Qty: 60 | Fill #0

## 2016-06-05 LAB — URINE CULTURE

## 2016-08-01 ENCOUNTER — Ambulatory Visit (HOSPITAL_COMMUNITY)
Admission: EM | Admit: 2016-08-01 | Discharge: 2016-08-01 | Disposition: A | Discharge status: Other Acute Inpatient Hospital | Payer: No Typology Code available for payment source

## 2016-08-09 ENCOUNTER — Encounter (HOSPITAL_COMMUNITY): Payer: Self-pay

## 2016-08-09 ENCOUNTER — Emergency Department (HOSPITAL_COMMUNITY): Payer: Self-pay

## 2016-08-09 ENCOUNTER — Emergency Department (HOSPITAL_COMMUNITY)
Admission: EM | Admit: 2016-08-09 | Discharge: 2016-08-10 | Disposition: A | Discharge status: Home / Self Care | Payer: Self-pay | Attending: Emergency Medicine | Admitting: Emergency Medicine

## 2016-08-09 DIAGNOSIS — R739 Hyperglycemia, unspecified: Secondary | ICD-10-CM | POA: Insufficient documentation

## 2016-08-09 DIAGNOSIS — R079 Chest pain, unspecified: Secondary | ICD-10-CM

## 2016-08-09 DIAGNOSIS — E876 Hypokalemia: Secondary | ICD-10-CM | POA: Insufficient documentation

## 2016-08-09 DIAGNOSIS — Z79899 Other long term (current) drug therapy: Secondary | ICD-10-CM | POA: Insufficient documentation

## 2016-08-09 DIAGNOSIS — R002 Palpitations: Secondary | ICD-10-CM | POA: Insufficient documentation

## 2016-08-09 LAB — CBC
HEMATOCRIT: 34.7 % — AB (ref 36.0–46.0)
HEMOGLOBIN: 11.3 g/dL — AB (ref 12.0–15.0)
MCH: 27.7 pg (ref 26.0–34.0)
MCHC: 32.6 g/dL (ref 30.0–36.0)
MCV: 85 fL (ref 78.0–100.0)
Platelets: 365 10*3/uL (ref 150–400)
RBC: 4.08 MIL/uL (ref 3.87–5.11)
RDW: 13.8 % (ref 11.5–15.5)
WBC: 7.4 10*3/uL (ref 4.0–10.5)

## 2016-08-09 LAB — I-STAT TROPONIN, ED: TROPONIN I, POC: 0 ng/mL (ref 0.00–0.08)

## 2016-08-09 LAB — HEPATIC FUNCTION PANEL
ALBUMIN: 3.8 g/dL (ref 3.5–5.0)
ALT: 12 U/L — ABNORMAL LOW (ref 14–54)
AST: 19 U/L (ref 15–41)
Alkaline Phosphatase: 68 U/L (ref 38–126)
BILIRUBIN TOTAL: 0.3 mg/dL (ref 0.3–1.2)
Bilirubin, Direct: <0.1 mg/dL — ABNORMAL LOW (ref 0.1–0.5)
Total Protein: 7.4 g/dL (ref 6.5–8.1)

## 2016-08-09 LAB — BASIC METABOLIC PANEL
ANION GAP: 10 (ref 5–15)
BUN: 6 mg/dL (ref 6–20)
CALCIUM: 8.9 mg/dL (ref 8.9–10.3)
CHLORIDE: 106 mmol/L (ref 101–111)
CO2: 23 mmol/L (ref 22–32)
Creatinine, Ser: 0.54 mg/dL (ref 0.44–1.00)
GFR calc Af Amer: >60 mL/min (ref >60)
GFR calc non Af Amer: >60 mL/min (ref >60)
Glucose, Bld: 190 mg/dL — ABNORMAL HIGH (ref 65–99)
POTASSIUM: 3.1 mmol/L — AB (ref 3.5–5.1)
Sodium: 139 mmol/L (ref 135–145)

## 2016-08-09 LAB — LIPASE, BLOOD: Lipase: 31 U/L (ref 11–51)

## 2016-08-09 MED ORDER — PANTOPRAZOLE SODIUM 40 MG IV SOLR
40.0000 mg | Freq: Once | INTRAVENOUS | Status: AC
  Administered 2016-08-09: 40 mg via INTRAVENOUS
  Filled 2016-08-09: qty 40

## 2016-08-09 MED ORDER — IPRATROPIUM-ALBUTEROL 0.5-2.5 (3) MG/3ML IN SOLN
3.0000 mL | Freq: Once | RESPIRATORY_TRACT | Status: AC
  Administered 2016-08-09: 3 mL via RESPIRATORY_TRACT
  Filled 2016-08-09: qty 3

## 2016-08-09 NOTE — ED Triage Notes (Signed)
Per Pt and family, Pt is coming from home with complaints of chest pain and palpitations x 1 week. Reports taking metoprolol with no relief.

## 2016-08-09 NOTE — ED Provider Notes (Signed)
[facility] DEPT Provider Note   CSN: [id number] Arrival date & time: 08-30-2016  3267     History   Chief Complaint Chief Complaint  Patient presents with  . Palpitations    HPI Betty Cain is a 41 y.o. female.  HPI Patient has had symptoms for approximately a week. She reports she has had shortness of breath and chest pain that is coming and going. She also perceives her heart to be racing. She indicates the chest pain is right along the sternum. Patient denies fever. She reports she has had a small amount of coughing. No swelling or pain of her legs. No recent travel. No history of PE or DVT. She has had palpitations in the past. Past Medical History:  Diagnosis Date  . Anemia   . IUD (intrauterine device) in place 09/03/2011   Due out in 08/30/2016  . Neonatal death    Vaginal delivery, full term-lived x2 hours.   . Palpitations   . Shortness of breath   . Spinal headache   . Valvular heart disease     Patient Active Problem List   Diagnosis Date Noted  . Vertigo 07/22/2015  . Abdominal discomfort 02/14/2015  . Low back pain 02/14/2015  . Generalized abdominal pain 02/01/2015  . Nausea 02/01/2015  . Environmental allergies 02/01/2015  . Language barrier to communication 02/01/2015  . Anemia 01/23/2015  . Prediabetes 01/23/2015  . Constipation 01/21/2015  . Knee pain, bilateral 01/21/2015  . Previous cesarean delivery affecting pregnancy 12/11/2013  . Palpitations 09/18/2013  . Valvular heart disease 09/18/2013  . Shortness of breath 09/18/2013  . Previous cesarean delivery, delivered, with or without mention of antepartum condition 06/19/2013  . Advanced maternal age in pregnancy in second trimester 06/19/2013    Past Surgical History:  Procedure Laterality Date  . ADENOIDECTOMY     as a child  . boil  2003   right elbow  . CESAREAN SECTION    . CESAREAN SECTION  06/02/2011   Procedure: CESAREAN SECTION;  Surgeon: [name], MD;  Location: [facility];   Service: Gynecology;  Laterality: N/A;  Primary Cesarean Section Delivery Baby Boy @ 0004, Apgars 9/9  . CESAREAN SECTION N/A 12/10/2013   Procedure: REPEAT CESAREAN SECTION;  Surgeon: [name], MD;  Location: [facility];  Service: Obstetrics;  Laterality: N/A;    OB History    Gravida Para Term Preterm AB Living   5 5 5     4    SAB TAB Ectopic Multiple Live Births           5       Home Medications    Prior to Admission medications   Medication Sig Start Date End Date Taking? Authorizing Provider  acetaminophen (TYLENOL) 500 MG tablet Take 1 tablet (500 mg total) by mouth every 6 (six) hours as needed. 01/21/15   Dorena Dew, FNP  metoprolol tartrate (LOPRESSOR) 25 MG tablet Take 1 tablet (25 mg total) by mouth 2 (two) times daily. 12/02/15   Micheline Chapman, NP  naproxen (NAPROSYN) 500 MG tablet TAKE 1 TABLET BY MOUTH 2 TIMES DAILY WITH A MEAL. 06/04/16   Dorena Dew, FNP  pantoprazole (PROTONIX) 20 MG tablet TAKE 1 TABLET BY MOUTH DAILY. 06/04/16   Dorena Dew, FNP  potassium chloride SA (K-DUR,KLOR-CON) 20 MEQ tablet Take 1 tablet (20 mEq total) by mouth 2 (two) times daily. 08/10/16   Charlesetta Shanks, MD    Family History Family History  Problem  Relation Age of Onset  . Diabetes Mother   . Diabetes Father   . Diabetes Sister   . Anesthesia problems Neg Hx     Social History Social History  Substance Use Topics  . Smoking status: Never Smoker  . Smokeless tobacco: Never Used  . Alcohol use No     Allergies   Patient has no known allergies.   Review of Systems Review of Systems 10 Systems reviewed and are negative for acute change except as noted in the HPI.   Physical Exam Updated Vital Signs BP (!) 104/59   Pulse 95   Temp 99.1 F (37.3 C) (Oral)   Resp 16   Ht 4\' 10"  (1.473 m)   Wt 149 lb (67.6 kg)   SpO2 100%   BMI 31.14 kg/m   Physical Exam  Constitutional: She is oriented to person, place, and time. She appears well-developed and  well-nourished. No distress.  HENT:  Head: Normocephalic and atraumatic.  Nose: Nose normal.  Mouth/Throat: Oropharynx is clear and moist.  Eyes: Conjunctivae and EOM are normal. Pupils are equal, round, and reactive to light.  Neck: Neck supple.  Cardiovascular: Normal rate, regular rhythm, normal heart sounds and intact distal pulses.   No murmur heard. Pulmonary/Chest: Effort normal and breath sounds normal. No respiratory distress.  Abdominal: Soft. She exhibits no distension. There is no tenderness. There is no guarding.  Musculoskeletal: Normal range of motion. She exhibits no edema or tenderness.  Neurological: She is alert and oriented to person, place, and time. No cranial nerve deficit. She exhibits normal muscle tone. Coordination normal.  Skin: Skin is warm and dry.  Psychiatric: She has a normal mood and affect.  Nursing note and vitals reviewed.    ED Treatments / Results  Labs (all labs ordered are listed, but only abnormal results are displayed) Labs Reviewed  BASIC METABOLIC PANEL - Abnormal; Notable for the following:       Result Value   Potassium 3.1 (*)    Glucose, Bld 190 (*)    All other components within normal limits  CBC - Abnormal; Notable for the following:    Hemoglobin 11.3 (*)    HCT 34.7 (*)    All other components within normal limits  HEPATIC FUNCTION PANEL - Abnormal; Notable for the following:    ALT 12 (*)    Bilirubin, Direct <0.1 (*)    All other components within normal limits  LIPASE, BLOOD  I-STAT TROPOININ, ED    EKG  EKG Interpretation  Date/Time:  Monday August 09 2016 18:31:55 EDT Ventricular Rate:  116 PR Interval:    QRS Duration: 86 QT Interval:  452 QTC Calculation: 628 R Axis:   65 Text Interpretation:  sinus tachycardia nonspecific diffuse T wave flattening borderline ECG. no sig change rom old Reconfirmed by Johnney Killian, MD, Jeannie Done [phone]) on 08/09/2016 9:10:05 PM       Radiology Dg Chest 2 View  Result Date:  08/09/2016 CLINICAL DATA:  Chest pain and palpitations for the past week. EXAM: CHEST  2 VIEW COMPARISON:  07/08/2013. FINDINGS: Normal sized heart. Interval small rounded nodular density overlying the left upper lung zone and anterior aspect of the left second rib. Otherwise, clear lungs. Minimal thoracic spine degenerative changes. IMPRESSION: Possible interval small left upper lobe of lung nodule or bone island in the left anterior second rib. These possibilities could be differentiated with a chest CT without contrast. Otherwise, unremarkable examination. Electronically Signed   By: Claudie Revering  M.D.   On: 08/09/2016 19:26   Ct Angio Chest Pe W/cm &/or Wo Cm  Result Date: 08/10/2016 CLINICAL DATA:  Chest pain with palpitations for 1 week EXAM: CT ANGIOGRAPHY CHEST WITH CONTRAST TECHNIQUE: Multidetector CT imaging of the chest was performed using the standard protocol during bolus administration of intravenous contrast. Multiplanar CT image reconstructions and MIPs were obtained to evaluate the vascular anatomy. CONTRAST:  100 mL Isovue 370 intravenous COMPARISON:  Chest x-ray 08/09/2016 FINDINGS: Cardiovascular: Satisfactory opacification of the pulmonary arteries to the segmental level. No evidence of pulmonary embolism. Normal heart size. No pericardial effusion. Non aneurysmal aorta. No dissection is seen. Mediastinum/Nodes: No enlarged mediastinal, hilar, or axillary lymph nodes. Thyroid gland, trachea, and esophagus demonstrate no significant findings. Lungs/Pleura: Lungs are clear. No pleural effusion or pneumothorax. Upper Abdomen: No acute abnormality. Musculoskeletal: No chest wall abnormality. No acute or significant osseous findings. Review of the MIP images confirms the above findings. IMPRESSION: Negative for acute pulmonary embolus or aortic dissection. Clear lung fields. Electronically Signed   By: Donavan Foil M.D.   On: 08/10/2016 00:47    Procedures Procedures (including critical care  time)  Medications Ordered in ED Medications  ipratropium-albuterol (DUONEB) 0.5-2.5 (3) MG/3ML nebulizer solution 3 mL (3 mLs Nebulization Given 08/09/16 2127)  pantoprazole (PROTONIX) injection 40 mg (40 mg Intravenous Given 08/09/16 2127)  iopamidol (ISOVUE-370) 76 % injection (100 mLs  Contrast Given 08/10/16 0007)     Initial Impression / Assessment and Plan / ED Course  I have reviewed the triage vital signs and the nursing notes.  Pertinent labs & imaging results that were available during my care of the patient were reviewed by me and considered in my medical decision making (see chart for details).     Final Clinical Impressions(s) / ED Diagnoses   Final diagnoses:  Palpitations  Chest pain, unspecified type  Hyperglycemia  Hypokalemia   Patient has had symptoms of intermittent palpitations, chest pain or shortness of breath over the course of the past week. Review of EMR does indicate prior history of complaint of palpitations. Patient does have documented normal echo from June 2015. Cardiac exam is normal without murmur. Chest x-ray indicated nodule was recommended CT scan for further evaluation. At this time, I have low suspicion for infectious etiology. Patient does not show signs of instability and has low risk factor for cardiac ischemic disease. CT scan is normal. Incidental finding on plain film chest x-ray was not present on a CT and no other structural anomalies. At this time I feel the patient is stable for continued outpatient management of palpitations and atypical chest pain. Patient has mild hypokalemia and hyperglycemia. Instructions are given for initial dietary management of these conditions and patient is advised to follow-up with her PCP for close surveillance of blood sugars. New Prescriptions New Prescriptions   POTASSIUM CHLORIDE SA (K-DUR,KLOR-CON) 20 MEQ TABLET    Take 1 tablet (20 mEq total) by mouth 2 (two) times daily.     Charlesetta Shanks, MD 08/10/16  [phone]

## 2016-08-09 NOTE — ED Notes (Signed)
ED Provider at bedside. 

## 2016-08-10 ENCOUNTER — Emergency Department (HOSPITAL_COMMUNITY): Payer: Self-pay

## 2016-08-10 MED ORDER — POTASSIUM CHLORIDE CRYS ER 20 MEQ PO TBCR: 20.0000 meq | EXTENDED_RELEASE_TABLET | Freq: Two times a day (BID) | ORAL | 0 refills | Status: DC

## 2016-08-10 MED ORDER — IOPAMIDOL (ISOVUE-370) INJECTION 76%
INTRAVENOUS | Status: AC
  Administered 2016-08-10: 100 mL
  Filled 2016-08-10: qty 100

## 2016-08-10 NOTE — ED Notes (Signed)
Patient returned from CT

## 2016-08-10 NOTE — Discharge Instructions (Signed)
See your family doctor for recheck within the next 3-5 days. Take potassium as prescribed for the next several days. Begin increasing your dietary intake of potassium as well.

## 2016-08-13 ENCOUNTER — Other Ambulatory Visit: Payer: Self-pay | Admitting: Family Medicine

## 2016-08-13 ENCOUNTER — Encounter: Payer: Self-pay | Admitting: Family Medicine

## 2016-08-13 ENCOUNTER — Ambulatory Visit (INDEPENDENT_AMBULATORY_CARE_PROVIDER_SITE_OTHER): Payer: No Typology Code available for payment source | Admitting: Family Medicine

## 2016-08-13 VITALS — BP 105/59 | HR 94 | Temp 98.5°F | Resp 14 | Ht <= 58 in | Wt 145.0 lb

## 2016-08-13 DIAGNOSIS — R000 Tachycardia, unspecified: Secondary | ICD-10-CM

## 2016-08-13 DIAGNOSIS — I471 Supraventricular tachycardia: Secondary | ICD-10-CM

## 2016-08-13 DIAGNOSIS — R7303 Prediabetes: Secondary | ICD-10-CM

## 2016-08-13 DIAGNOSIS — E876 Hypokalemia: Secondary | ICD-10-CM

## 2016-08-13 DIAGNOSIS — R9431 Abnormal electrocardiogram [ECG] [EKG]: Secondary | ICD-10-CM

## 2016-08-13 DIAGNOSIS — R42 Dizziness and giddiness: Secondary | ICD-10-CM

## 2016-08-13 LAB — BASIC METABOLIC PANEL
BUN: 7 mg/dL (ref 7–25)
CO2: 22 mmol/L (ref 20–31)
Calcium: 8.7 mg/dL (ref 8.6–10.2)
Chloride: 107 mmol/L (ref 98–110)
Creat: 0.49 mg/dL — ABNORMAL LOW (ref 0.50–1.10)
GLUCOSE: 103 mg/dL — AB (ref 65–99)
POTASSIUM: 3.8 mmol/L (ref 3.5–5.3)
SODIUM: 139 mmol/L (ref 135–146)

## 2016-08-13 LAB — POCT GLYCOSYLATED HEMOGLOBIN (HGB A1C): Hemoglobin A1C: 5.7

## 2016-08-13 LAB — GLUCOSE, CAPILLARY: Glucose-Capillary: 96 mg/dL (ref 65–99)

## 2016-08-13 MED ORDER — BLOOD PRESSURE MONITORING DEVI: 1.0000 | Freq: Every day | 0 refills | Status: DC

## 2016-08-13 MED FILL — METOPROLOL TARTRATE 25 MG T: 25 | 30 days supply | Qty: 60 | Fill #0

## 2016-08-13 MED FILL — NAPROXEN 500 MG TABLET: 500 | 30 days supply | Qty: 60 | Fill #1

## 2016-08-13 MED FILL — PANTOPRAZOLE SOD DR 20 MG T: 20 | 30 days supply | Qty: 30 | Fill #1

## 2016-08-13 NOTE — Patient Instructions (Addendum)
Refrain from drinking caffeine.  Refrain from OTC supplements.  Check blood pressure daily Take Protonix 20 mg daily  Potassium twice daily as prescribed Will repeat labs on Wednesday Sinus Tachycardia Sinus tachycardia is a kind of fast heartbeat. In sinus tachycardia, the heart beats more than 100 times a minute. Sinus tachycardia starts in a part of the heart called the sinus node. Sinus tachycardia may be harmless, or it may be a sign of a serious condition. What are the causes? This condition may be caused by:  Exercise or exertion.  A fever.  Pain.  Loss of body fluids (dehydration).  Severe bleeding (hemorrhage).  Anxiety and stress.  Certain substances, including:  Alcohol.  Caffeine.  Tobacco and nicotine products.  Diet pills.  Illegal drugs.  Medical conditions including:  Heart disease.  An infection.  An overactive thyroid (hyperthyroidism).  A lack of red blood cells (anemia). What are the signs or symptoms? Symptoms of this condition include:  A feeling that the heart is beating quickly (palpitations).  Suddenly noticing your heartbeat (cardiac awareness).  Dizziness.  Tiredness (fatigue).  Shortness of breath.  Chest pain.  Nausea.  Fainting. How is this diagnosed? This condition is diagnosed with:  A physical exam.  Other tests, such as:  Blood tests.  An electrocardiogram (ECG). This test measures the electrical activity of the heart.  Holter monitoring. For this test, you wear a device that records your heartbeat for one or more days. You may be referred to a heart specialist (cardiologist). How is this treated? Treatment for this condition depends on the cause or underlying condition. Treatment may involve:  Treating the underlying condition.  Taking new medicines or changing your current medicines as told by your health care provider.  Making changes to your diet or lifestyle.  Practicing relaxation  methods. Follow these instructions at home: Lifestyle   Do not use any products that contain nicotine or tobacco, such as cigarettes and e-cigarettes. If you need help quitting, ask your health care provider.  Learn relaxation methods, like deep breathing, to help you when you get stressed or anxious.  Do not use illegal drugs, such as cocaine.  Do not abuse alcohol. Limit alcohol intake to no more than 1 drink a day for non-pregnant women and 2 drinks a day for men. One drink equals 12 oz of beer, 5 oz of wine, or 1 oz of hard liquor.  Find time to rest and relax often. This reduces stress.  Avoid:  Caffeine.  Stimulants such as over-the-counter diet pills or pills that help you to stay awake.  Situations that cause anxiety or stress. General instructions   Drink enough fluids to keep your urine clear or pale yellow.  Take over-the-counter and prescription medicines only as told by your health care provider.  Keep all follow-up visits as told by your health care provider. This is important. Contact a health care provider if:  You have a fever.  You have vomiting or diarrhea that keeps happening (is persistent). Get help right away if:  You have pain in your chest, upper arms, jaw, or neck.  You become weak or dizzy.  You feel faint.  You have palpitations that do not go away. This information is not intended to replace advice given to you by your health care provider. Make sure you discuss any questions you have with your health care provider. Document Released: 05/06/2004 Document Revised: 10/25/2015 Document Reviewed: 10/11/2014 Elsevier Interactive Patient Education  2017 [employer].  Palpitations A palpitation is the feeling that your heart:  Has an uneven (irregular) heartbeat.  Is beating faster than normal.  Is fluttering.  Is skipping a beat. This is usually not a serious problem. In some cases, you may need more medical tests. Follow these  instructions at home:  Avoid:  Caffeine in coffee, tea, soft drinks, diet pills, and energy drinks.  Chocolate.  Alcohol.  Do not use any tobacco products. These include cigarettes, chewing tobacco, and e-cigarettes. If you need help quitting, ask your doctor.  Try to reduce your stress. These things may help:  Yoga.  Meditation.  Physical activity. Swimming, jogging, and walking are good choices.  A method that helps you use your mind to control things in your body, like heartbeats (biofeedback).  Get plenty of rest and sleep.  Take over-the-counter and prescription medicines only as told by your doctor.  Keep all follow-up visits as told by your doctor. This is important. Contact a doctor if:  Your heartbeat is still fast or uneven after 24 hours.  Your palpitations occur more often. Get help right away if:  You have chest pain.  You feel short of breath.  You have a very bad headache.  You feel dizzy.  You pass out (faint). This information is not intended to replace advice given to you by your health care provider. Make sure you discuss any questions you have with your health care provider. Document Released: 01/06/2008 Document Revised: 09/04/2015 Document Reviewed: 12/12/2014 Elsevier Interactive Patient Education  2017 [employer].

## 2016-08-13 NOTE — Progress Notes (Signed)
[name], a 41 year old female presents complaining of heart palpitations, shortness of breath and dizziness. She has been having symptoms for greater than 1 week. She has a history of valvular heart disease. She says that she has dizziness daily with standing. Also, she feels short of breath and that her heart is racing with exertion. She was evaluated in the emergency department on 2016-09-04. She was found to have a serum potassium of 2.6. She says that she did not pick up potassium tablets from pharmacy.    Dizziness  This is a new problem. The current episode started 1 to 4 weeks ago. The problem occurs intermittently. The problem has been gradually improving. Associated symptoms include chest pain, fatigue and numbness. Pertinent negatives include no abdominal pain, arthralgias, change in bowel habit, chills, joint swelling, myalgias, neck pain, vertigo or vomiting. The symptoms are aggravated by standing. She has tried nothing for the symptoms.  Palpitations   This is a recurrent problem. The current episode started more than 1 month ago. The problem has been waxing and waning. Associated symptoms include chest pain, dizziness, numbness and shortness of breath. Pertinent negatives include no irregular heartbeat, syncope or vomiting. Risk factors include obesity and sedentary lifestyle. Her past medical history is significant for a valve disorder. There is no history of anemia, anxiety, drug use or heart disease.      Past Medical History:  Diagnosis Date  . Anemia   . IUD (intrauterine device) in place 09/03/2011   Due out in 09-04-2016  . Neonatal death    Vaginal delivery, full term-lived x2 hours.   . Palpitations   . Shortness of breath   . Spinal headache   . Valvular heart disease    Social History   Social History  . Marital status: Married    Spouse name: N/A  . Number of children: N/A  . Years of education: N/A   Occupational History  . Not on file.   Social  History Main Topics  . Smoking status: Never Smoker  . Smokeless tobacco: Never Used  . Alcohol use No  . Drug use: No  . Sexual activity: Yes    Birth control/ protection: None     Comment: pregnant   Other Topics Concern  . Not on file   Social History Narrative  . No narrative on file  Review of Systems  Constitution: Positive for fatigue. Negative for chills, night sweats and weight gain.  HENT: Negative.   Eyes: Negative for vision loss in left eye and vision loss in right eye.  Cardiovascular: Positive for chest pain, dyspnea on exertion and palpitations. Negative for irregular heartbeat, leg swelling and syncope.  Respiratory: Positive for shortness of breath. Negative for sputum production and wheezing.   Endocrine: Negative.  Negative for cold intolerance, heat intolerance, polydipsia, polyphagia and polyuria.  Musculoskeletal: Negative for arthralgias, joint swelling, myalgias and neck pain.  Gastrointestinal: Negative.  Negative for abdominal pain, change in bowel habit and vomiting.  Genitourinary: Negative.   Neurological: Positive for dizziness and numbness. Negative for vertigo.   Physical Exam  Constitutional: She is well-developed, well-nourished, and in no distress.  HENT:  Head: Normocephalic and atraumatic.  Right Ear: External ear normal.  Left Ear: External ear normal.  Nose: Nose normal.  Mouth/Throat: Oropharynx is clear and moist.  Eyes: EOM are normal. Pupils are equal, round, and reactive to light.  Neck: Normal range of motion. Neck supple. No thyromegaly present.  Cardiovascular: Regular rhythm  and normal pulses.   No murmur heard. Pulses:      Carotid pulses are 2+ on the right side, and 2+ on the left side.      Radial pulses are 2+ on the right side, and 2+ on the left side.       Femoral pulses are 2+ on the right side, and 2+ on the left side.      Popliteal pulses are 2+ on the right side, and 2+ on the left side.       Dorsalis pedis  pulses are 2+ on the right side, and 2+ on the left side.       Posterior tibial pulses are 2+ on the right side, and 2+ on the left side.  Pulmonary/Chest: Effort normal and breath sounds normal. No respiratory distress. She has no wheezes. She has no rales. She exhibits no tenderness.  Abdominal: Soft. Bowel sounds are normal. There is no tenderness.  Neurological: She is alert. She has normal motor skills, normal sensation, normal strength, normal reflexes and intact cranial nerves. She displays no weakness. She has a normal Romberg Test. Gait normal. Gait normal.    BP (!) 105/59 (BP Location: Left Arm, Patient Position: Sitting, Cuff Size: Normal)   Pulse 94   Temp 98.5 F (36.9 C) (Oral)   Resp 14   Ht 4\' 10"  (1.473 m)   Wt 145 lb (65.8 kg)   SpO2 100%   BMI 30.31 kg/m  1. Sinus tachycardia Patient has a history of valvular hear disease. Reviewed Echocardiogram from 2015, unremarkable. I will defer to cardiology for further workup and evaluation.  - Blood Pressure Monitoring DEVI; 1 each by Does not apply route daily.  Dispense: 1 Device; Refill: 0 - Ambulatory referral to Cardiology  2. Prolonged Q-T interval on ECG - Ambulatory referral to Cardiology  3. Prediabetes - HgB A1c  4. Hypokalemia Previous potassium is 2.6, will recheck potassium.  - Basic Metabolic Panel  5. Dizziness - Glucose, capillary - Orthostatic vital signs  6. Language barrier to communication   Patient primarily speaks arabic, used video interpreter to assist with communication   RTC: 1 week for labs     Donia Pounds  MSN, FNP-C [facility] [address] [place], [place] [address] [phone]

## 2016-08-16 ENCOUNTER — Ambulatory Visit: Payer: No Typology Code available for payment source

## 2016-08-17 ENCOUNTER — Ambulatory Visit: Payer: No Typology Code available for payment source | Admitting: Family Medicine

## 2016-08-18 ENCOUNTER — Other Ambulatory Visit (INDEPENDENT_AMBULATORY_CARE_PROVIDER_SITE_OTHER): Payer: No Typology Code available for payment source

## 2016-08-18 DIAGNOSIS — R000 Tachycardia, unspecified: Secondary | ICD-10-CM | POA: Insufficient documentation

## 2016-08-18 DIAGNOSIS — R9431 Abnormal electrocardiogram [ECG] [EKG]: Secondary | ICD-10-CM | POA: Insufficient documentation

## 2016-08-18 DIAGNOSIS — R002 Palpitations: Secondary | ICD-10-CM

## 2016-08-18 LAB — BASIC METABOLIC PANEL
BUN: 9 mg/dL (ref 7–25)
CALCIUM: 9.3 mg/dL (ref 8.6–10.2)
CO2: 22 mmol/L (ref 20–31)
CREATININE: 0.62 mg/dL (ref 0.50–1.10)
Chloride: 106 mmol/L (ref 98–110)
GLUCOSE: 122 mg/dL — AB (ref 65–99)
Potassium: 4.1 mmol/L (ref 3.5–5.3)
Sodium: 141 mmol/L (ref 135–146)

## 2016-08-23 ENCOUNTER — Ambulatory Visit: Payer: Medicaid Other

## 2016-09-01 MED FILL — MONO-LINYAH 28 TABLET: 0.25-35 | 28 days supply | Qty: 28 | Fill #0

## 2016-09-14 ENCOUNTER — Encounter: Payer: Self-pay | Admitting: Family Medicine

## 2016-09-14 ENCOUNTER — Ambulatory Visit (INDEPENDENT_AMBULATORY_CARE_PROVIDER_SITE_OTHER): Payer: Self-pay | Admitting: Family Medicine

## 2016-09-14 VITALS — BP 109/66 | HR 77 | Temp 99.0°F | Resp 14 | Ht <= 58 in | Wt 146.0 lb

## 2016-09-14 DIAGNOSIS — R002 Palpitations: Secondary | ICD-10-CM

## 2016-09-14 DIAGNOSIS — I38 Endocarditis, valve unspecified: Secondary | ICD-10-CM

## 2016-09-14 DIAGNOSIS — R197 Diarrhea, unspecified: Secondary | ICD-10-CM

## 2016-09-14 DIAGNOSIS — Z789 Other specified health status: Secondary | ICD-10-CM

## 2016-09-14 DIAGNOSIS — R0602 Shortness of breath: Secondary | ICD-10-CM

## 2016-09-14 DIAGNOSIS — R400 Somnolence: Secondary | ICD-10-CM

## 2016-09-14 DIAGNOSIS — Z9109 Other allergy status, other than to drugs and biological substances: Secondary | ICD-10-CM

## 2016-09-14 MED ORDER — CETIRIZINE HCL 10 MG PO TABS: 10.0000 mg | ORAL_TABLET | Freq: Every day | ORAL | 11 refills | Status: DC

## 2016-09-14 MED FILL — ?CETIRIZINE HCL 10 MG TABLE: 10 | 30 days supply | Qty: 30 | Fill #0

## 2016-09-14 NOTE — Patient Instructions (Signed)
Environmental allergies: Cetirizine 10 mg daily at bedtime.  Increase daily water intake.  Diarrhea: Replenish fluids. I will check electrolytes today. Refrain from anti-diarrheal agents   Heart Palpitations/Dizziness/Irregular heartbeat:   Sent a referral to cardiology Set up an appointment for echocardiogram Continue Metoprolol 25 mg twice daily.    Excessive daytime sleepiness:   Split night sleep study will be scheduled Practice good sleep hygiene.  Stick to a sleep schedule, even on weekends. Exercise is great, but not too late in the day Avoid alcoholic drinks before bed Avoid large meals and beverages late before bed Don't take naps after 3 pm. Keep power naps less than 1 hour.  Relax before bed.  Take a hot bath before bed.  Have a good sleeping environment. Get rid of anything in your bedroom that might distract you from sleep.  Adopt good sleeping posture.

## 2016-09-14 NOTE — Progress Notes (Addendum)
Ms. Tahiri Shareef, a 41 year old female with a history of valvular heart disease, heart palpitations, and shortness of breath presents for a follow up. [name] continues to have heart palpitations at rest.Initial onset was in 2015 with a previous pregnancy. She says that chest pains have been worsening over the past several weeks. She endorses occasional shortness of breath and dizziness. Her last echocardiogram was in 2015, which showed normal systolic function and an ejection fraction was 60-65%. Also,  trivial regurgitation in mitral valve. Yalda describes discomfort as pressure to chest. Chest pain does not radiate. Associated symptoms are dyspnea and dizziness.  Chest pain is aggravated by exertion.  Patient's cardiac risk factors are obesity (BMI >= 30 kg/m2) and sedentary lifestyle.  Past Medical History:  Diagnosis Date  . Anemia   . IUD (intrauterine device) in place 09/03/2011   Due out in Aug 19, 2016  . Neonatal death    Vaginal delivery, full term-lived x2 hours.   . Palpitations   . Shortness of breath   . Spinal headache   . Valvular heart disease     Allergies as of 09/14/2016   No Known Allergies     Medication List       Accurate as of 09/14/16 11:59 PM. Always use your most recent med list.          acetaminophen 500 MG tablet Commonly known as:  TYLENOL Take 1 tablet (500 mg total) by mouth every 6 (six) hours as needed.   Blood Pressure Monitoring Devi 1 each by Does not apply route daily.   cetirizine 10 MG tablet Commonly known as:  ZYRTEC Take 1 tablet (10 mg total) by mouth daily.   metoprolol tartrate 25 MG tablet Commonly known as:  LOPRESSOR Take 1 tablet (25 mg total) by mouth 2 (two) times daily.   MONO-LINYAH 0.25-35 MG-MCG tablet Generic drug:  norgestimate-ethinyl estradiol Take 1 tablet by mouth daily.   naproxen 500 MG tablet Commonly known as:  NAPROSYN TAKE 1 TABLET BY MOUTH 2 TIMES DAILY WITH A MEAL.   pantoprazole 20 MG tablet Commonly  known as:  PROTONIX TAKE 1 TABLET BY MOUTH DAILY.      Social History   Social History  . Marital status: Married    Spouse name: N/A  . Number of children: N/A  . Years of education: N/A   Occupational History  . Not on file.   Social History Main Topics  . Smoking status: Never Smoker  . Smokeless tobacco: Never Used  . Alcohol use No  . Drug use: No  . Sexual activity: Yes    Birth control/ protection: None     Comment: pregnant   Other Topics Concern  . Not on file   Social History Narrative  . No narrative on file  Review of Systems  Constitutional: Negative.   HENT: Negative.   Eyes: Negative.   Respiratory: Positive for shortness of breath.   Cardiovascular: Positive for chest pain and palpitations.  Genitourinary: Negative.   Skin: Negative.   Neurological: Negative.  Negative for weakness.  Endo/Heme/Allergies: Negative.   Psychiatric/Behavioral: Negative.   Physical Exam  Constitutional: She is oriented to person, place, and time.  HENT:  Head: Normocephalic and atraumatic.  Right Ear: External ear normal.  Left Ear: External ear normal.  Nose: Nose normal.  Mouth/Throat: Oropharynx is clear and moist.  Eyes: Conjunctivae and EOM are normal. Pupils are equal, round, and reactive to light.  Neck: Normal range of motion.  Neck supple.  Cardiovascular: Regular rhythm, normal heart sounds and intact distal pulses.  Tachycardia present.  Exam reveals no gallop.   No murmur heard. Pulses:      Carotid pulses are 2+ on the right side, and 2+ on the left side.      Radial pulses are 2+ on the right side, and 2+ on the left side.       Femoral pulses are 2+ on the right side, and 2+ on the left side.      Popliteal pulses are 2+ on the right side, and 2+ on the left side.       Dorsalis pedis pulses are 2+ on the right side, and 2+ on the left side.       Posterior tibial pulses are 2+ on the right side, and 2+ on the left side.  Pulmonary/Chest: Effort  normal and breath sounds normal.  Abdominal: Soft. Bowel sounds are normal.  Neurological: She is alert and oriented to person, place, and time. Gait normal. GCS score is 15.  Skin: Skin is warm and dry.  Psychiatric: Mood, memory, affect and judgment normal.     Plan   1. Valvular heart disease I will send a referral to cardiology due to continuous heart palpitations. I will defer to cardiology for further workup and evaluation. Reviewed previous echocardiogram from 09/28/2013, unremarkable. I will repeat echo due to current symptoms.  - Ambulatory referral to Cardiology - ECHOCARDIOGRAM COMPLETE; Future  2. Heart palpitations Patient may warrant a Holter monitor due to periodic palpitations. Continue Metoprolol at this time - Ambulatory referral to Cardiology - ECHOCARDIOGRAM COMPLETE; Future  3. Daytime sleepiness I will send for a split night sleep study to rule out sleep apnea - Split night study; Future  4. Shortness of breath - Split night study; Future - ECHOCARDIOGRAM COMPLETE; Future  5. Diarrhea, unspecified type Recommend a lowfat diet and increase fluid intake.  - BASIC METABOLIC PANEL WITH GFR  6. Environmental allergies - cetirizine (ZYRTEC) 10 MG tablet; Take 1 tablet (10 mg total) by mouth daily.  Dispense: 30 tablet; Refill: 11  7. Language barrier to communication Patient primarily speaks Arabic, used video interpreter to assist with communication   Donia Pounds  MSN, FNP-C [place], [place] [address] [phone]    RTC: 1 month for chronic conditions

## 2016-09-15 LAB — BASIC METABOLIC PANEL WITH GFR
BUN: 13 mg/dL (ref 7–25)
CO2: 22 mmol/L (ref 20–31)
Calcium: 9.4 mg/dL (ref 8.6–10.2)
Chloride: 105 mmol/L (ref 98–110)
Creat: 0.58 mg/dL (ref 0.50–1.10)
Glucose, Bld: 106 mg/dL — ABNORMAL HIGH (ref 65–99)
POTASSIUM: 4.4 mmol/L (ref 3.5–5.3)
Sodium: 139 mmol/L (ref 135–146)

## 2016-09-27 MED FILL — MONO-LINYAH 28 TABLET: 0.25-35 | 28 days supply | Qty: 28 | Fill #1

## 2016-09-27 MED FILL — PANTOPRAZOLE SOD DR 20 MG T: 20 | 30 days supply | Qty: 30 | Fill #2

## 2016-09-27 MED FILL — ?METOPROLOL 25 MG TABLET: 25 | 30 days supply | Qty: 60 | Fill #1

## 2016-10-14 ENCOUNTER — Ambulatory Visit (INDEPENDENT_AMBULATORY_CARE_PROVIDER_SITE_OTHER): Payer: No Typology Code available for payment source | Admitting: Family Medicine

## 2016-10-14 ENCOUNTER — Encounter: Payer: Self-pay | Admitting: Family Medicine

## 2016-10-14 VITALS — BP 103/70 | HR 80 | Temp 98.5°F | Resp 16 | Ht <= 58 in | Wt 144.0 lb

## 2016-10-14 DIAGNOSIS — M25512 Pain in left shoulder: Secondary | ICD-10-CM

## 2016-10-14 DIAGNOSIS — M25511 Pain in right shoulder: Secondary | ICD-10-CM

## 2016-10-14 DIAGNOSIS — Z789 Other specified health status: Secondary | ICD-10-CM

## 2016-10-14 DIAGNOSIS — G629 Polyneuropathy, unspecified: Secondary | ICD-10-CM

## 2016-10-14 MED ORDER — GABAPENTIN 100 MG PO CAPS: 100.0000 mg | ORAL_CAPSULE | Freq: Two times a day (BID) | ORAL | 1 refills | Status: DC

## 2016-10-14 MED FILL — GABAPENTIN 100 MG CAPSULE: 100 | 30 days supply | Qty: 60 | Fill #0

## 2016-10-14 NOTE — Progress Notes (Signed)
Betty Cain, a 41 year old female with a history of valvular heart disease and tachycardia presents complaining of bilateral shoulder pain over the past several days. She denies injury, she says that pain was present upon awakening. She describes pain as intermittent, burning, and shooting. She has taken OTC Tylenol without relief.    Shoulder Pain   The pain is present in the right shoulder and left shoulder. This is a new problem. The pain is at a severity of 9/10. The pain is severe. Associated symptoms include a limited range of motion, numbness and tingling. Pertinent negatives include no joint swelling. She has tried acetaminophen (Around 9:30 pm without sustained relief. ) for the symptoms. The treatment provided no relief. Prediabetes   Past Medical History:  Diagnosis Date  . Anemia   . IUD (intrauterine device) in place 09/03/2011   Due out in 08-31-2016  . Neonatal death    Vaginal delivery, full term-lived x2 hours.   . Palpitations   . Shortness of breath   . Spinal headache   . Valvular heart disease    Social History   Social History  . Marital status: Married    Spouse name: N/A  . Number of children: N/A  . Years of education: N/A   Occupational History  . Not on file.   Social History Main Topics  . Smoking status: Never Smoker  . Smokeless tobacco: Never Used  . Alcohol use No  . Drug use: No  . Sexual activity: Yes    Birth control/ protection: None     Comment: pregnant   Other Topics Concern  . Not on file   Social History Narrative  . No narrative on file   Immunization History  Administered Date(s) Administered  . Influenza,inj,Quad PF,36+ Mos 06/19/2013, 12/02/2015  . Tdap 09/13/2013, 12/11/2013  Review of Systems  Constitutional: Negative.   HENT: Negative.   Eyes: Negative.   Respiratory: Positive for shortness of breath.   Cardiovascular: Positive for palpitations.  Gastrointestinal: Negative.   Musculoskeletal: Positive for extremity  weakness and myalgias.  Skin: Negative.   Neurological: Positive for tingling and numbness.  Endo/Heme/Allergies: Negative.   Psychiatric/Behavioral: Negative.    Physical Exam  Constitutional: She is oriented to person, place, and time.  Cardiovascular: Normal rate.  An irregular rhythm present.  Pulmonary/Chest: Effort normal and breath sounds normal.  Musculoskeletal:       Right shoulder: She exhibits decreased range of motion and decreased strength.       Left shoulder: She exhibits decreased range of motion and decreased strength.  Neurological: She is alert and oriented to person, place, and time. Gait normal.  Skin: Skin is warm and dry.  Psychiatric: Mood, memory, affect and judgment normal.   BP 103/70 (BP Location: Left Arm, Patient Position: Sitting, Cuff Size: Normal)   Pulse 80   Temp 98.5 F (36.9 C) (Oral)   Resp 16   Ht 4\' 10"  (1.473 m)   Wt 144 lb (65.3 kg)   SpO2 100%   BMI 30.10 kg/m   1. Acute pain of both shoulders I will start a trial of gabapentin for bilateral shoulder pain.  Also refrain from repetitive tasking and lifting objects greater than 20 pounds over the next week.  If pain persists [name] may warrant further work up and evaluation.  - gabapentin (NEURONTIN) 100 MG capsule; Take 1 capsule (100 mg total) by mouth 2 (two) times daily.  Dispense: 60 capsule; Refill: 1  2. Neuropathy  [name] has a history of prediabetes, I recommend that she continue a carbohydrate modified diet to prevent worsening neuropathy.  - gabapentin (NEURONTIN) 100 MG capsule; Take 1 capsule (100 mg total) by mouth 2 (two) times daily.  Dispense: 60 capsule; Refill: 1  3. Language barrier to communication Patient primarily speaks arabic, utilized video interpreter to assist with communication   RTC: As previously scheduled for chronic conditions. I recommend that she follow up with cardiology as scheduled for valvular heart disease, palpitations and prolonged QT.     Donia Pounds  MSN, FNP-C [facility] [address] [place], [place] [address] [phone]

## 2016-10-14 NOTE — Patient Instructions (Addendum)
  :      Gabapentin      .          .        .          3-4.     Musculoskeletal  Musculoskeletal      .          .     :                .            .  lessens      .        .          .                  Marland Kitchen         Marland Kitchen

## 2016-10-17 DIAGNOSIS — R079 Chest pain, unspecified: Secondary | ICD-10-CM | POA: Insufficient documentation

## 2016-10-17 NOTE — Progress Notes (Signed)
Cardiology Office Note    Date:  10/19/2016   ID:  Betty, Cain 12-15-1975, MRN [id number]  PCP:  Dorena Dew, FNP  Cardiologist: Sinclair Grooms, MD   Chief Complaint  Patient presents with  . Palpitations  . Chest Pain    History of Present Illness:  Betty Cain is a 41 y.o. female , mother of 4 between the age of 37 and 28. Referred by Cammie Sickle FNP for evaluation palpitations and chest discomfort.  Pleasant Muslim female who complains of palpitations and sensation not her heart is racing. The sensations are more apparent at rest and at night when she tries to sleep. She feels the heart is racing and episodes are associated with dizziness. No shortness of breath. There is occasional chest pain but this is poorly characterized. There are no exertional components. She is physically active and able to care for her family, clean, and do chores without limitations.  Prior workup for similar complaints in 2015 included a structurally normal heart by echo and a normal 30 day monitor. Recent evaluation of these complaints included a CT angiogram of the chest in May 2018 which was unremarkable. No coronary calcification was noted.  Past Medical History:  Diagnosis Date  . Anemia   . IUD (intrauterine device) in place 09/03/2011   Due out in 2016/08/21  . Neonatal death    Vaginal delivery, full term-lived x2 hours.   . Palpitations   . Shortness of breath   . Spinal headache   . Valvular heart disease     Past Surgical History:  Procedure Laterality Date  . ADENOIDECTOMY     as a child  . boil  2003   right elbow  . CESAREAN SECTION    . CESAREAN SECTION  06/02/2011   Procedure: CESAREAN SECTION;  Surgeon: [name], MD;  Location: [facility];  Service: Gynecology;  Laterality: N/A;  Primary Cesarean Section Delivery Baby Boy @ 0004, Apgars 9/9  . CESAREAN SECTION N/A 12/10/2013   Procedure: REPEAT CESAREAN SECTION;  Surgeon: [name], MD;  Location: [facility];   Service: Obstetrics;  Laterality: N/A;    Current Medications: Outpatient Medications Prior to Visit  Medication Sig Dispense Refill  . acetaminophen (TYLENOL) 500 MG tablet Take 1 tablet (500 mg total) by mouth every 6 (six) hours as needed. 30 tablet 0  . Blood Pressure Monitoring DEVI 1 each by Does not apply route daily. 1 Device 0  . cetirizine (ZYRTEC) 10 MG tablet Take 1 tablet (10 mg total) by mouth daily. 30 tablet 11  . gabapentin (NEURONTIN) 100 MG capsule Take 1 capsule (100 mg total) by mouth 2 (two) times daily. 60 capsule 1  . metoprolol tartrate (LOPRESSOR) 25 MG tablet Take 1 tablet (25 mg total) by mouth 2 (two) times daily. 60 tablet 2  . MONO-LINYAH 0.25-35 MG-MCG tablet Take 1 tablet by mouth daily.  1  . naproxen (NAPROSYN) 500 MG tablet TAKE 1 TABLET BY MOUTH 2 TIMES DAILY WITH A MEAL. 60 tablet 1  . pantoprazole (PROTONIX) 20 MG tablet TAKE 1 TABLET BY MOUTH DAILY. 30 tablet 11   No facility-administered medications prior to visit.      Allergies:   Patient has no known allergies.   Social History   Social History  . Marital status: Married    Spouse name: N/A  . Number of children: N/A  . Years of education: N/A   Social History Main Topics  .  Smoking status: Never Smoker  . Smokeless tobacco: Never Used  . Alcohol use No  . Drug use: No  . Sexual activity: Yes    Birth control/ protection: None     Comment: pregnant   Other Topics Concern  . None   Social History Narrative  . None     Family History:  The patient's family history includes Diabetes in her father, mother, and sister. Her mother had open heart surgery for aortic valve disease.  ROS:   Please see the history of present illness.    No complaints other than concern about the chest palpitations and discomfort.  All other systems reviewed and are negative.   PHYSICAL EXAM:   VS:  BP 114/76 (BP Location: Left Arm)   Pulse 91   Ht 4\' 10"  (1.473 m)   Wt 144 lb 9.6 oz (65.6 kg)    BMI 30.22 kg/m    GEN: Well nourished, well developed, in no acute distress . Completely covered including her hair and her traditional car. HEENT: normal  Neck: no JVD, carotid bruits, or masses Cardiac: RRR; no murmurs, rubs, or gallops,no edema  Respiratory:  clear to auscultation bilaterally, normal work of breathing GI: soft, nontender, nondistended, + BS MS: no deformity or atrophy  Skin: warm and dry, no rash Neuro:  Alert and Oriented x 3, Strength and sensation are intact Psych: euthymic mood, full affect  Wt Readings from Last 3 Encounters:  10/19/16 144 lb 9.6 oz (65.6 kg)  10/14/16 144 lb (65.3 kg)  09/14/16 146 lb (66.2 kg)      Studies/Labs Reviewed:   EKG:  EKG  Nonspecific T wave flattening, normal sinus rhythm, overall normal appearing tracing  Recent Labs: 08/09/2016: ALT 12; Hemoglobin 11.3; Platelets 365 09/14/2016: BUN 13; Creat 0.58; Potassium 4.4; Sodium 139   Lipid Panel    Component Value Date/Time   CHOL 165 10/08/2014 1121   TRIG 107 10/08/2014 1121   HDL 30 (L) 10/08/2014 1121   CHOLHDL 5.5 10/08/2014 1121   VLDL 21 10/08/2014 1121   LDLCALC 114 (H) 10/08/2014 1121    Additional studies/ records that were reviewed today include:   CT Angio 08/10/2016: IMPRESSION: Negative for acute pulmonary embolus or aortic dissection. Clear lung fields.  ECHOCARDIOGRAPHY 2015: Study Conclusions  - Left ventricle: The cavity size was normal. Wall thickness was normal. Systolic function was normal. The estimated ejection fraction was in the range of 60% to 65%.  Onset June 2015:  NSR and ST  ASSESSMENT:    1. Palpitations   2. Chest pain at rest      PLAN:  In order of problems listed above:  1. Rule out sustained tachyarrhythmia which would be the most likely diagnosis, i.e. PSVT. Rule out isolated PACs or PVCs. Thirty-day continuous monitor as prescribed. 2. The chest pain does not sound ischemic and is possibly  related to palpitations. We will perform an exercise treadmill test because on occasion her complaints of palpitation or precipitated by extreme activity.  Await results of 30 day monitor and exercise treadmill test. We will not find any evidence of ischemia. We are trying to determine if there is inducible arrhythmia.  Medication Adjustments/Labs and Tests Ordered: Current medicines are reviewed at length with the patient today.  Concerns regarding medicines are outlined above.  Medication changes, Labs and Tests ordered today are listed in the Patient Instructions below. Patient Instructions  Medication Instructions:  None  Labwork: None  Testing/Procedures: Your  physician has recommended that you wear an event monitor. Event monitors are medical devices that record the heart's electrical activity. Doctors most often [place] these monitors to diagnose arrhythmias. Arrhythmias are problems with the speed or rhythm of the heartbeat. The monitor is a small, portable device. You can wear one while you do your normal daily activities. This is usually used to diagnose what is causing palpitations/syncope (passing out).  Your physician has requested that you have an exercise tolerance test. For further information please visit HugeFiesta.tn. Please also follow instruction sheet, as given.   Follow-Up: Your physician recommends that you schedule a follow-up appointment as needed with Dr. Tamala Julian.    Any Other Special Instructions Will Be Listed Below (If Applicable).     If you need a refill on your cardiac medications before your next appointment, please call your pharmacy.      Signed, Sinclair Grooms, MD  10/19/2016 11:31 AM    [facility] [place], [place], [place]  [address] Phone: [phone]; Fax: [phone]

## 2016-10-19 ENCOUNTER — Telehealth: Payer: Self-pay | Admitting: Interventional Cardiology

## 2016-10-19 ENCOUNTER — Encounter (INDEPENDENT_AMBULATORY_CARE_PROVIDER_SITE_OTHER): Payer: Self-pay

## 2016-10-19 ENCOUNTER — Other Ambulatory Visit: Payer: Self-pay | Admitting: Family Medicine

## 2016-10-19 ENCOUNTER — Encounter: Payer: Self-pay | Admitting: Interventional Cardiology

## 2016-10-19 ENCOUNTER — Ambulatory Visit (INDEPENDENT_AMBULATORY_CARE_PROVIDER_SITE_OTHER): Payer: No Typology Code available for payment source | Admitting: Interventional Cardiology

## 2016-10-19 VITALS — BP 114/76 | HR 91 | Ht <= 58 in | Wt 144.6 lb

## 2016-10-19 DIAGNOSIS — R002 Palpitations: Secondary | ICD-10-CM

## 2016-10-19 DIAGNOSIS — M25569 Pain in unspecified knee: Secondary | ICD-10-CM

## 2016-10-19 DIAGNOSIS — R079 Chest pain, unspecified: Secondary | ICD-10-CM

## 2016-10-19 MED FILL — ?CETIRIZINE HCL 10 MG TABLE: 10 | 30 days supply | Qty: 30 | Fill #1

## 2016-10-19 MED FILL — MONTELUKAST SOD 10 MG TAB: 10 | 30 days supply | Qty: 30 | Fill #4

## 2016-10-19 MED FILL — PANTOPRAZOLE SOD DR 20 MG T: 20 | 30 days supply | Qty: 30 | Fill #3

## 2016-10-19 NOTE — Telephone Encounter (Signed)
Patient was giving the application for [employer] - for hardship - she is to fill out the application and return to [place].  Once faxed they will call patient to let them know if the was approved.

## 2016-10-19 NOTE — Patient Instructions (Addendum)
Medication Instructions:  None  Labwork: None  Testing/Procedures: Your physician has recommended that you wear an event monitor. Event monitors are medical devices that record the heart's electrical activity. Doctors most often [place] these monitors to diagnose arrhythmias. Arrhythmias are problems with the speed or rhythm of the heartbeat. The monitor is a small, portable device. You can wear one while you do your normal daily activities. This is usually used to diagnose what is causing palpitations/syncope (passing out).  Your physician has requested that you have an exercise tolerance test. For further information please visit HugeFiesta.tn. Please also follow instruction sheet, as given.   Follow-Up: Your physician recommends that you schedule a follow-up appointment as needed with Dr. Tamala Julian.    Any Other Special Instructions Will Be Listed Below (If Applicable).     If you need a refill on your cardiac medications before your next appointment, please call your pharmacy.

## 2016-10-20 MED FILL — NAPROXEN 500 MG TABLET: 500 | 30 days supply | Qty: 60 | Fill #0

## 2016-11-02 ENCOUNTER — Ambulatory Visit (INDEPENDENT_AMBULATORY_CARE_PROVIDER_SITE_OTHER): Payer: No Typology Code available for payment source

## 2016-11-02 DIAGNOSIS — R079 Chest pain, unspecified: Secondary | ICD-10-CM

## 2016-11-02 LAB — EXERCISE TOLERANCE TEST
CHL CUP MPHR: 182 {beats}/min
CSEPED: 5 min
CSEPPHR: 164 {beats}/min
Estimated workload: 7 METS
Exercise duration (sec): 0 s
Percent HR: 90 %
RPE: 17
Rest HR: 100 {beats}/min

## 2016-11-04 ENCOUNTER — Telehealth: Payer: Self-pay | Admitting: Interventional Cardiology

## 2016-11-04 NOTE — Telephone Encounter (Signed)
Patient returning call from yesterday, thanks.

## 2016-11-04 NOTE — Telephone Encounter (Signed)
Called interpreter service and interpreter [phone] assisted with call. Made pt aware of results and recommendations per Dr. Tamala Julian. Pt agreeable to plan.

## 2016-11-05 ENCOUNTER — Encounter: Payer: Self-pay | Admitting: *Deleted

## 2016-11-05 NOTE — Progress Notes (Signed)
Patient ID: Betty Cain, female   DOB: 21-Dec-1975, 41 y.o.   MRN: [id number] Patient enrolled for [employer] to mail a 30 day cardiac event monitor to her home pending approval of the [employer].

## 2016-11-14 ENCOUNTER — Ambulatory Visit (HOSPITAL_BASED_OUTPATIENT_CLINIC_OR_DEPARTMENT_OTHER): Payer: No Typology Code available for payment source | Attending: Family Medicine | Admitting: Internal Medicine

## 2016-11-14 VITALS — Ht <= 58 in | Wt 140.0 lb

## 2016-11-14 DIAGNOSIS — R0602 Shortness of breath: Secondary | ICD-10-CM

## 2016-11-14 DIAGNOSIS — R400 Somnolence: Secondary | ICD-10-CM | POA: Insufficient documentation

## 2016-11-14 DIAGNOSIS — R0683 Snoring: Secondary | ICD-10-CM | POA: Insufficient documentation

## 2016-11-21 DIAGNOSIS — R0602 Shortness of breath: Secondary | ICD-10-CM

## 2016-11-21 NOTE — Procedures (Signed)
  Patient Name: Betty Cain (Jim), Tawyna Study Date: 11/14/2016 Gender: Female D.O.B: [birth year] Age (years): 41 Referring Provider: Cammie Sickle Height (inches): 58 Interpreting Physician: Baird Lyons MD, ABSM Weight (lbs): 140 RPSGT: Earney Hamburg BMI: 29 MRN: [id number] Neck Size: 13.00 CLINICAL INFORMATION Sleep Study Type: NPSG  Indication for sleep study: shortness of breath  Epworth Sleepiness Score: 12  SLEEP STUDY TECHNIQUE As per the AASM Manual for the Scoring of Sleep and Associated Events v2.3 (April 2016) with a hypopnea requiring 4% desaturations.  The channels recorded and monitored were frontal, central and occipital EEG, electrooculogram (EOG), submentalis EMG (chin), nasal and oral airflow, thoracic and abdominal wall motion, anterior tibialis EMG, snore microphone, electrocardiogram, and pulse oximetry.  MEDICATIONS Medications self-administered by patient taken the night of the study : METOPROLOL, MONTELUKAST, PANTOPRAZOLE SODIUM, CETIRIZINE HCL, NAPROXEN  SLEEP ARCHITECTURE The study was initiated at 10:48:13 PM and ended at 4:49:13 AM.  Sleep onset time was 62.8 minutes and the sleep efficiency was 71.7%. The total sleep time was 258.7 minutes.  Stage REM latency was 94.5 minutes.  The patient spent 4.25% of the night in stage N1 sleep, 84.92% in stage N2 sleep, 0.39% in stage N3 and 10.44% in REM.  Alpha intrusion was absent.  Supine sleep was 13.72%.  RESPIRATORY PARAMETERS The overall apnea/hypopnea index (AHI) was 0.0 per hour. There were 0 total apneas, including 0 obstructive, 0 central and 0 mixed apneas. There were 0 hypopneas and 1 RERAs.  The AHI during Stage REM sleep was 0.0 per hour.  AHI while supine was 0.0 per hour.  The mean oxygen saturation was 98.39%.   Loud snoring was noted during this study.  CARDIAC DATA The 2 lead EKG demonstrated sinus rhythm. The mean heart rate was 35.28 beats per minute. Other EKG  findings include: None.  LEG MOVEMENT DATA The total PLMS were 6 with a resulting PLMS index of 1.39. Associated arousal with leg movement index was 0.0 .  IMPRESSIONS - No significant obstructive sleep apnea occurred during this study (AHI = 0.0/h). - No significant central sleep apnea occurred during this study (CAI = 0.0/h). - No oxygen desaturation was noted during this study (Mean O2 = 98.4%). - The patient snored with Loud snoring volume. - No cardiac abnormalities were noted during this study. - Clinically significant periodic limb movements did not occur during sleep. No significant associated arousals.  DIAGNOSIS - Primary Snoring (786.09 [R06.83 ICD-10])  RECOMMENDATIONS - Sleep hygiene should be reviewed to assess factors that may improve sleep quality. - Weight management and regular exercise should be initiated or continued if appropriate.  [Electronically signed] 11/21/2016 09:55 AM  Baird Lyons MD, ABSM Diplomate, American Board of Sleep Medicine   NPI: [id number]  [place], American Board of Sleep Medicine  ELECTRONICALLY SIGNED ON:  11/21/2016, 9:50 AM [place] PH: [phone]   FX: [phone] [place]

## 2016-11-22 MED FILL — MONO-LINYAH 28 TABLET: 0.25-35 | 28 days supply | Qty: 28 | Fill #0

## 2016-12-01 ENCOUNTER — Encounter: Payer: Self-pay | Admitting: Family Medicine

## 2016-12-01 ENCOUNTER — Ambulatory Visit (INDEPENDENT_AMBULATORY_CARE_PROVIDER_SITE_OTHER): Payer: Self-pay | Admitting: Family Medicine

## 2016-12-01 VITALS — BP 113/64 | HR 94 | Temp 98.8°F | Resp 14 | Ht <= 58 in | Wt 143.0 lb

## 2016-12-01 DIAGNOSIS — R7303 Prediabetes: Secondary | ICD-10-CM

## 2016-12-01 DIAGNOSIS — I951 Orthostatic hypotension: Secondary | ICD-10-CM

## 2016-12-01 DIAGNOSIS — R42 Dizziness and giddiness: Secondary | ICD-10-CM

## 2016-12-01 LAB — POCT GLYCOSYLATED HEMOGLOBIN (HGB A1C): Hemoglobin A1C: 5.7

## 2016-12-01 NOTE — Progress Notes (Signed)
Betty Cain, a 41 year old female with a history of valvular heart disease, heart palpitations, and shortness of breath presents for a follow up. [name] continues to have periodic  heart palpitations at rest and occasional dizziness. Initial onset was in 2015 with a previous pregnancy.  She endorses occasional shortness of breath and dizziness. Her last echocardiogram showed normal systolic function and an ejection fraction was 60-65%. Also,  trivial regurgitation in mitral valve. Patient was referred to cardiology for further evaluation. CT angiogram of the chest in May 2018 and 30 day monitor were both normal.   [name] describes discomfort as pressure to chest. Chest pain does not radiate. Associated symptoms are dyspnea and dizziness.  Chest pain is aggravated by exertion.  She also endorses occasional shoulder pain. Patient's cardiac risk factors are obesity (BMI >= 30 kg/m2) and sedentary lifestyle.  Past Medical History:  Diagnosis Date  . Anemia   . IUD (intrauterine device) in place 09/03/2011   Due out in Aug 27, 2016  . Neonatal death    Vaginal delivery, full term-lived x2 hours.   . Palpitations   . Shortness of breath   . Spinal headache   . Valvular heart disease     Allergies as of 12/01/2016   No Known Allergies     Medication List       Accurate as of 12/01/16  3:14 PM. Always use your most recent med list.          acetaminophen 500 MG tablet Commonly known as:  TYLENOL Take 1 tablet (500 mg total) by mouth every 6 (six) hours as needed.   Blood Pressure Monitoring Devi 1 each by Does not apply route daily.   cetirizine 10 MG tablet Commonly known as:  ZYRTEC Take 1 tablet (10 mg total) by mouth daily.   gabapentin 100 MG capsule Commonly known as:  NEURONTIN Take 1 capsule (100 mg total) by mouth 2 (two) times daily.   MAGNESIUM PO Take 1 tablet by mouth daily.   metoprolol tartrate 25 MG tablet Commonly known as:  LOPRESSOR Take 1 tablet (25 mg total) by  mouth 2 (two) times daily.   MONO-LINYAH 0.25-35 MG-MCG tablet Generic drug:  norgestimate-ethinyl estradiol Take 1 tablet by mouth daily.   naproxen 500 MG tablet Commonly known as:  NAPROSYN TAKE 1 TABLET BY MOUTH 2 TIMES DAILY WITH A MEAL.   pantoprazole 20 MG tablet Commonly known as:  PROTONIX TAKE 1 TABLET BY MOUTH DAILY.            Discharge Care Instructions        Start     Ordered   12/01/16 0000  HgB A1c     12/01/16 1048     Social History   Social History  . Marital status: Married    Spouse name: N/A  . Number of children: N/A  . Years of education: N/A   Occupational History  . Not on file.   Social History Main Topics  . Smoking status: Never Smoker  . Smokeless tobacco: Never Used  . Alcohol use No  . Drug use: No  . Sexual activity: Yes    Birth control/ protection: None     Comment: pregnant   Other Topics Concern  . Not on file   Social History Narrative  . No narrative on file  Review of Systems  Constitutional: Negative.   HENT: Negative.   Eyes: Negative.   Respiratory: Positive for shortness of breath.   Cardiovascular:  Positive for chest pain and palpitations.  Genitourinary: Negative.   Skin: Negative.   Neurological: Negative.  Negative for weakness.  Endo/Heme/Allergies: Negative.   Psychiatric/Behavioral: Negative.   Physical Exam  Constitutional: She is oriented to person, place, and time.  HENT:  Head: Normocephalic and atraumatic.  Right Ear: External ear normal.  Left Ear: External ear normal.  Nose: Nose normal.  Mouth/Throat: Oropharynx is clear and moist.  Eyes: Pupils are equal, round, and reactive to light. Conjunctivae and EOM are normal.  Neck: Normal range of motion. Neck supple.  Cardiovascular: Regular rhythm, normal heart sounds and intact distal pulses.  Tachycardia present.  Exam reveals no gallop.   No murmur heard. Pulses:      Carotid pulses are 2+ on the right side, and 2+ on the left  side.      Radial pulses are 2+ on the right side, and 2+ on the left side.       Femoral pulses are 2+ on the right side, and 2+ on the left side.      Popliteal pulses are 2+ on the right side, and 2+ on the left side.       Dorsalis pedis pulses are 2+ on the right side, and 2+ on the left side.       Posterior tibial pulses are 2+ on the right side, and 2+ on the left side.  Pulmonary/Chest: Effort normal and breath sounds normal.  Abdominal: Soft. Bowel sounds are normal.  Neurological: She is alert and oriented to person, place, and time. Gait normal. GCS score is 15.  Skin: Skin is warm and dry.  Psychiatric: Mood, memory, affect and judgment normal.     Plan  1. Orthostatic hypotension Blood pressure and pulse decrease slightly when transitioning from sitting to standing. When you transition from sitting or lying down, the body must work to adjust to that change in position. It is especially important for the body to push blood upward and supply the brain with oxygen. If the body fails to do this adequately, blood pressure falls, you may feel dizzy.  Given written information.  Also, recommend adequate fluid intake   2. Dizziness Reviewed orthostatic vital signs.  - HgB A1c  3. Prediabetes Hemoglobin a1C is stable at 5.7, will repeat in 6 months.  Recommend a lowfat, low carbohydrate diet divided over 5-6 small meals throughout the day - HgB A1c   RTC: 6 months for chronic conditions and health maintenance

## 2016-12-01 NOTE — Patient Instructions (Addendum)
Orthostatic Hypotension Orthostatic hypotension is a sudden drop in blood pressure that happens when you quickly change positions, such as when you get up from a seated or lying position. Blood pressure is a measurement of how strongly, or weakly, your blood is pressing against the walls of your arteries. Arteries are blood vessels that carry blood from your heart throughout your body. When blood pressure is too low, you may not get enough blood to your brain or to the rest of your organs. This can cause weakness, light-headedness, rapid heartbeat, and fainting. This can last for just a few seconds or for up to a few minutes. Orthostatic hypotension is usually not a serious problem. However, if it happens frequently or gets worse, it may be a sign of something more serious. What are the causes? This condition may be caused by:  Sudden changes in posture, such as standing up quickly after you have been sitting or lying down.  Blood loss.  Loss of body fluids (dehydration).  Heart problems.  Hormone (endocrine) problems.  Pregnancy.  Severe infection.  Lack of certain nutrients.  Severe allergic reactions (anaphylaxis).  Certain medicines, such as blood pressure medicine or medicines that make the body lose excess fluids (diuretics). Sometimes, this condition can be caused by not taking medicine as directed, such as taking too much of a certain medicine.  What increases the risk? Certain factors can make you more likely to develop orthostatic hypotension, including:  Age. Risk increases as you get older.  Conditions that affect the heart or the central nervous system.  Taking certain medicines, such as blood pressure medicine or diuretics.  Being pregnant.  What are the signs or symptoms? Symptoms of this condition may include:  Weakness.  Light-headedness.  Dizziness.  Blurred vision.  Fatigue.  Rapid heartbeat.  Fainting, in severe cases.  How is this  diagnosed? This condition is diagnosed based on:  Your medical history.  Your symptoms.  Your blood pressure measurement. Your health care provider will check your blood pressure when you are: ? Lying down. ? Sitting. ? Standing.  A blood pressure reading is recorded as two numbers, such as "120 over 80" (or 120/80). The first ("top") number is called the systolic pressure. It is a measure of the pressure in your arteries as your heart beats. The second ("bottom") number is called the diastolic pressure. It is a measure of the pressure in your arteries when your heart relaxes between beats. Blood pressure is measured in a unit called mm Hg. Healthy blood pressure for adults is 120/80. If your blood pressure is below 90/60, you may be diagnosed with hypotension. Other information or tests that may be used to diagnose orthostatic hypotension include:  Your other vital signs, such as your heart rate and temperature.  Blood tests.  Tilt table test. For this test, you will be safely secured to a table that moves you from a lying position to an upright position. Your heart rhythm and blood pressure will be monitored during the test.  How is this treated? Treatment for this condition may include:  Changing your diet. This may involve eating more salt (sodium) or drinking more water.  Taking medicines to raise your blood pressure.  Changing the dosage of certain medicines you are taking that might be lowering your blood pressure.  Wearing compression stockings. These stockings help to prevent blood clots and reduce swelling in your legs.  In some cases, you may need to go to the hospital for:    Fluid replacement. This means you will receive fluids through an IV tube.  Blood replacement. This means you will receive donated blood through an IV tube (transfusion).  Treating an infection or heart problems, if this applies.  Monitoring. You may need to be monitored while medicines that you  are taking wear off.  Follow these instructions at home: Eating and drinking   Drink enough fluid to keep your urine clear or pale yellow.  Eat a healthy diet and follow instructions from your health care provider about eating or drinking restrictions. A healthy diet includes: ? Fresh fruits and vegetables. ? Whole grains. ? Lean meats. ? Low-fat dairy products.  Eat extra salt only as directed. Do not add extra salt to your diet unless your health care provider told you to do that.  Eat frequent, small meals.  Avoid standing up suddenly after eating. Medicines  Take over-the-counter and prescription medicines only as told by your health care provider. ? Follow instructions from your health care provider about changing the dosage of your current medicines, if this applies. ? Do not stop or adjust any of your medicines on your own. General instructions  Wear compression stockings as told by your health care provider.  Get up slowly from lying down or sitting positions. This gives your blood pressure a chance to adjust.  Avoid hot showers and excessive heat as directed by your health care provider.  Return to your normal activities as told by your health care provider. Ask your health care provider what activities are safe for you.  Do not use any products that contain nicotine or tobacco, such as cigarettes and e-cigarettes. If you need help quitting, ask your health care provider.  Keep all follow-up visits as told by your health care provider. This is important. Contact a health care provider if:  You vomit.  You have diarrhea.  You have a fever for more than 2-3 days.  You feel more thirsty than usual.  You feel weak and tired. Get help right away if:  You have chest pain.  You have a fast or irregular heartbeat.  You develop numbness in any part of your body.  You cannot move your arms or your legs.  You have trouble speaking.  You become sweaty or feel  lightheaded.  You faint.  You feel short of breath.  You have trouble staying awake.  You feel confused. This information is not intended to replace advice given to you by your health care provider. Make sure you discuss any questions you have with your health care provider. Document Released: 03/19/2002 Document Revised: 12/16/2015 Document Reviewed: 09/19/2015 Elsevier Interactive Patient Education  2018 [employer]. Exercising to [employer] Exercising can help you to lose weight. In order to lose weight through exercise, you need to do vigorous-intensity exercise. You can tell that you are exercising with vigorous intensity if you are breathing very hard and fast and cannot hold a conversation while exercising. Moderate-intensity exercise helps to maintain your current weight. You can tell that you are exercising at a moderate level if you have a higher heart rate and faster breathing, but you are still able to hold a conversation. How often should I exercise? Choose an activity that you enjoy and set realistic goals. Your health care provider can help you to make an activity plan that works for you. Exercise regularly as directed by your health care provider. This may include:  Doing resistance training twice each week, such as: ? Push-ups. ?  Sit-ups. ? Lifting weights. ? Using resistance bands.  Doing a given intensity of exercise for a given amount of time. Choose from these options: ? 150 minutes of moderate-intensity exercise every week. ? 75 minutes of vigorous-intensity exercise every week. ? A mix of moderate-intensity and vigorous-intensity exercise every week.  Children, pregnant women, people who are out of shape, people who are overweight, and older adults may need to consult a health care provider for individual recommendations. If you have any sort of medical condition, be sure to consult your health care provider before starting a new exercise program. What are some  activities that can help me to lose weight?  Walking at a rate of at least 4.5 miles an hour.  Jogging or running at a rate of 5 miles per hour.  Biking at a rate of at least 10 miles per hour.  Lap swimming.  Roller-skating or in-line skating.  Cross-country skiing.  Vigorous competitive sports, such as football, basketball, and soccer.  Jumping rope.  Aerobic dancing. How can I be more active in my day-to-day activities?  Use the stairs instead of the elevator.  Take a walk during your lunch break.  If you drive, park your car farther away from work or school.  If you take public transportation, get off one stop early and walk the rest of the way.  Make all of your phone calls while standing up and walking around.  Get up, stretch, and walk around every 30 minutes throughout the day. What guidelines should I follow while exercising?  Do not exercise so much that you hurt yourself, feel dizzy, or get very short of breath.  Consult your health care provider prior to starting a new exercise program.  Wear comfortable clothes and shoes with good support.  Drink plenty of water while you exercise to prevent dehydration or heat stroke. Body water is lost during exercise and must be replaced.  Work out until you breathe faster and your heart beats faster. This information is not intended to replace advice given to you by your health care provider. Make sure you discuss any questions you have with your health care provider. Document Released: 05/01/2010 Document Revised: 09/04/2015 Document Reviewed: 08/30/2013 Elsevier Interactive Patient Education  2018 [place]. Back Exercises The following exercises strengthen the muscles that help to support the back. They also help to keep the lower back flexible. Doing these exercises can help to prevent back pain or lessen existing pain. If you have back pain or discomfort, try doing these exercises 2-3 times each day or as told  by your health care provider. When the pain goes away, do them once each day, but increase the number of times that you repeat the steps for each exercise (do more repetitions). If you do not have back pain or discomfort, do these exercises once each day or as told by your health care provider. Exercises Single Knee to Chest  Repeat these steps 3-5 times for each leg: 1. Lie on your back on a firm bed or the floor with your legs extended. 2. Bring one knee to your chest. Your other leg should stay extended and in contact with the floor. 3. Hold your knee in place by grabbing your knee or thigh. 4. Pull on your knee until you feel a gentle stretch in your lower back. 5. Hold the stretch for 10-30 seconds. 6. Slowly release and straighten your leg.  Pelvic Tilt  Repeat these steps 5-10 times: 1. Lie on  your back on a firm bed or the floor with your legs extended. 2. Bend your knees so they are pointing toward the ceiling and your feet are flat on the floor. 3. Tighten your lower abdominal muscles to press your lower back against the floor. This motion will tilt your pelvis so your tailbone points up toward the ceiling instead of pointing to your feet or the floor. 4. With gentle tension and even breathing, hold this position for 5-10 seconds.  Cat-Cow  Repeat these steps until your lower back becomes more flexible: 1. Get into a hands-and-knees position on a firm surface. Keep your hands under your shoulders, and keep your knees under your hips. You may place padding under your knees for comfort. 2. Let your head hang down, and point your tailbone toward the floor so your lower back becomes rounded like the back of a cat. 3. Hold this position for 5 seconds. 4. Slowly lift your head and point your tailbone up toward the ceiling so your back forms a sagging arch like the back of a cow. 5. Hold this position for 5 seconds.  Press-Ups  Repeat these steps 5-10 times: 1. Lie on your abdomen  (face-down) on the floor. 2. Place your palms near your head, about shoulder-width apart. 3. While you keep your back as relaxed as possible and keep your hips on the floor, slowly straighten your arms to raise the top half of your body and lift your shoulders. Do not use your back muscles to raise your upper torso. You may adjust the placement of your hands to make yourself more comfortable. 4. Hold this position for 5 seconds while you keep your back relaxed. 5. Slowly return to lying flat on the floor.  Bridges  Repeat these steps 10 times: 1. Lie on your back on a firm surface. 2. Bend your knees so they are pointing toward the ceiling and your feet are flat on the floor. 3. Tighten your buttocks muscles and lift your buttocks off of the floor until your waist is at almost the same height as your knees. You should feel the muscles working in your buttocks and the back of your thighs. If you do not feel these muscles, slide your feet 1-2 inches farther away from your buttocks. 4. Hold this position for 3-5 seconds. 5. Slowly lower your hips to the starting position, and allow your buttocks muscles to relax completely.  If this exercise is too easy, try doing it with your arms crossed over your chest. Abdominal Crunches  Repeat these steps 5-10 times: 1. Lie on your back on a firm bed or the floor with your legs extended. 2. Bend your knees so they are pointing toward the ceiling and your feet are flat on the floor. 3. Cross your arms over your chest. 4. Tip your chin slightly toward your chest without bending your neck. 5. Tighten your abdominal muscles and slowly raise your trunk (torso) high enough to lift your shoulder blades a tiny bit off of the floor. Avoid raising your torso higher than that, because it can put too much stress on your low back and it does not help to strengthen your abdominal muscles. 6. Slowly return to your starting position.  Back Lifts Repeat these steps 5-10  times: 1. Lie on your abdomen (face-down) with your arms at your sides, and rest your forehead on the floor. 2. Tighten the muscles in your legs and your buttocks. 3. Slowly lift your chest off of  the floor while you keep your hips pressed to the floor. Keep the back of your head in line with the curve in your back. Your eyes should be looking at the floor. 4. Hold this position for 3-5 seconds. 5. Slowly return to your starting position.  Contact a health care provider if:  Your back pain or discomfort gets much worse when you do an exercise.  Your back pain or discomfort does not lessen within 2 hours after you exercise. If you have any of these problems, stop doing these exercises right away. Do not do them again unless your health care provider says that you can. Get help right away if:  You develop sudden, severe back pain. If this happens, stop doing the exercises right away. Do not do them again unless your health care provider says that you can. This information is not intended to replace advice given to you by your health care provider. Make sure you discuss any questions you have with your health care provider. Document Released: 05/06/2004 Document Revised: 08/06/2015 Document Reviewed: 05/23/2014 Elsevier Interactive Patient Education  2017 [employer]. o

## 2016-12-03 MED FILL — PANTOPRAZOLE SOD DR 20 MG T: 20 | 30 days supply | Qty: 30 | Fill #4

## 2016-12-03 MED FILL — METOPROLOL TARTRATE 25 MG T: 25 | 30 days supply | Qty: 60 | Fill #2

## 2016-12-03 MED FILL — NAPROXEN 500 MG TABLET: 500 | 30 days supply | Qty: 60 | Fill #1

## 2016-12-15 ENCOUNTER — Other Ambulatory Visit: Payer: Self-pay | Admitting: Internal Medicine

## 2016-12-15 DIAGNOSIS — Z9109 Other allergy status, other than to drugs and biological substances: Secondary | ICD-10-CM

## 2016-12-15 MED FILL — ?MONTELUKAST SOD 10MG TAB: 10 | 30 days supply | Qty: 30 | Fill #0

## 2016-12-15 MED FILL — ?CETIRIZINE HCL 10 MG TABLE: 10 | 30 days supply | Qty: 30 | Fill #2

## 2016-12-22 ENCOUNTER — Ambulatory Visit (INDEPENDENT_AMBULATORY_CARE_PROVIDER_SITE_OTHER): Payer: No Typology Code available for payment source

## 2016-12-22 DIAGNOSIS — R002 Palpitations: Secondary | ICD-10-CM

## 2017-01-13 ENCOUNTER — Telehealth: Payer: Self-pay | Admitting: *Deleted

## 2017-02-03 ENCOUNTER — Ambulatory Visit (INDEPENDENT_AMBULATORY_CARE_PROVIDER_SITE_OTHER): Payer: No Typology Code available for payment source | Admitting: Family Medicine

## 2017-02-03 ENCOUNTER — Other Ambulatory Visit: Payer: Self-pay | Admitting: Family Medicine

## 2017-02-03 ENCOUNTER — Encounter: Payer: Self-pay | Admitting: Family Medicine

## 2017-02-03 VITALS — BP 130/82 | HR 94 | Temp 99.1°F | Resp 16 | Ht <= 58 in | Wt 144.0 lb

## 2017-02-03 DIAGNOSIS — D649 Anemia, unspecified: Secondary | ICD-10-CM

## 2017-02-03 DIAGNOSIS — R5383 Other fatigue: Secondary | ICD-10-CM

## 2017-02-03 DIAGNOSIS — I471 Supraventricular tachycardia: Secondary | ICD-10-CM

## 2017-02-03 DIAGNOSIS — M25569 Pain in unspecified knee: Secondary | ICD-10-CM

## 2017-02-03 DIAGNOSIS — R059 Cough, unspecified: Secondary | ICD-10-CM

## 2017-02-03 DIAGNOSIS — Z789 Other specified health status: Secondary | ICD-10-CM

## 2017-02-03 DIAGNOSIS — R05 Cough: Secondary | ICD-10-CM

## 2017-02-03 LAB — CBC
HEMATOCRIT: 35.1 % (ref 35.0–45.0)
HEMOGLOBIN: 11.3 g/dL — AB (ref 11.7–15.5)
MCH: 26.3 pg — ABNORMAL LOW (ref 27.0–33.0)
MCHC: 32.2 g/dL (ref 32.0–36.0)
MCV: 81.8 fL (ref 80.0–100.0)
MPV: 11 fL (ref 7.5–12.5)
Platelets: 414 10*3/uL — ABNORMAL HIGH (ref 140–400)
RBC: 4.29 10*6/uL (ref 3.80–5.10)
RDW: 13.8 % (ref 11.0–15.0)
WBC: 7.1 10*3/uL (ref 3.8–10.8)

## 2017-02-03 MED ORDER — GUAIFENESIN-DM 100-10 MG/5ML PO SYRP: 5.0000 mL | ORAL_SOLUTION | Freq: Four times a day (QID) | ORAL | 0 refills | Status: DC | PRN

## 2017-02-03 MED FILL — ?CETIRIZINE HCL 10 MG TABLE: 10 | 30 days supply | Qty: 30 | Fill #3

## 2017-02-03 MED FILL — ?MONTELUKAST SOD 10MG TAB: 10 | 30 days supply | Qty: 30 | Fill #1

## 2017-02-03 MED FILL — METOPROLOL TARTRATE 25 MG T: 25 | 30 days supply | Qty: 60 | Fill #0

## 2017-02-03 MED FILL — NAPROXEN 500 MG TABLET: 500 | 30 days supply | Qty: 60 | Fill #0

## 2017-02-03 MED FILL — PANTOPRAZOLE SOD DR 20 MG T: 20 | 30 days supply | Qty: 30 | Fill #5

## 2017-02-03 MED FILL — ROBAFEN-DM SYRUP: 100-10 | 6 days supply | Qty: 118 | Fill #0

## 2017-02-03 NOTE — Patient Instructions (Addendum)
Will follow up by phone with abnormal laboratory results    Cough, Adult Coughing is a reflex that clears your throat and your airways. Coughing helps to heal and protect your lungs. It is normal to cough occasionally, but a cough that happens with other symptoms or lasts a long time may be a sign of a condition that needs treatment. A cough may last only 2-3 weeks (acute), or it may last longer than 8 weeks (chronic). What are the causes? Coughing is commonly caused by:  Breathing in substances that irritate your lungs.  A viral or bacterial respiratory infection.  Allergies.  Asthma.  Postnasal drip.  Smoking.  Acid backing up from the stomach into the esophagus (gastroesophageal reflux).  Certain medicines.  Chronic lung problems, including COPD (or rarely, lung cancer).  Other medical conditions such as heart failure.  Follow these instructions at home: Pay attention to any changes in your symptoms. Take these actions to help with your discomfort:  Take medicines only as told by your health care provider. ? If you were prescribed an antibiotic medicine, take it as told by your health care provider. Do not stop taking the antibiotic even if you start to feel better. ? Talk with your health care provider before you take a cough suppressant medicine.  Drink enough fluid to keep your urine clear or pale yellow.  If the air is dry, use a cold steam vaporizer or humidifier in your bedroom or your home to help loosen secretions.  Avoid anything that causes you to cough at work or at home.  If your cough is worse at night, try sleeping in a semi-upright position.  Avoid cigarette smoke. If you smoke, quit smoking. If you need help quitting, ask your health care provider.  Avoid caffeine.  Avoid alcohol.  Rest as needed.  Contact a health care provider if:  You have new symptoms.  You cough up pus.  Your cough does not get better after 2-3 weeks, or your cough  gets worse.  You cannot control your cough with suppressant medicines and you are losing sleep.  You develop pain that is getting worse or pain that is not controlled with pain medicines.  You have a fever.  You have unexplained weight loss.  You have night sweats. Get help right away if:  You cough up blood.  You have difficulty breathing.  Your heartbeat is very fast. This information is not intended to replace advice given to you by your health care provider. Make sure you discuss any questions you have with your health care provider. Document Released: 09/25/2010 Document Revised: 09/04/2015 Document Reviewed: 06/05/2014 Elsevier Interactive Patient Education  2017 [employer].

## 2017-02-03 NOTE — Progress Notes (Signed)
Subjective:     Betty Cain is a 41 y.o. female, with a history of tachycardia presetns complaining of fatigue. Patient is a busy mother of 4 children. Symptoms began several months ago.  Patient is followed by cardiology and recently had a stress test which was unremarkable.  The patient feels the fatigue began with: a significant change in weight and symptoms of arthritis. Symptoms of her fatigue have been diffuse soft tissue aches and pains, general malaise and lack of interest in usual activities.  Patient denies depression change in hair texture, cold intolerance, constipation, excessive menstrual bleeding, fever, GI blood loss and unusual rashes. Symptoms have been intermittent. Symptom severity: mild. . Past Medical History:  Diagnosis Date  . Anemia   . IUD (intrauterine device) in place 09/03/2011   Due out in 09-06-16  . Neonatal death    Vaginal delivery, full term-lived x2 hours.   . Palpitations   . Shortness of breath   . Spinal headache   . Valvular heart disease    Social History   Social History  . Marital status: Married    Spouse name: N/A  . Number of children: N/A  . Years of education: N/A   Occupational History  . Not on file.   Social History Main Topics  . Smoking status: Never Smoker  . Smokeless tobacco: Never Used  . Alcohol use No  . Drug use: No  . Sexual activity: Yes    Birth control/ protection: None     Comment: pregnant   Other Topics Concern  . Not on file   Social History Narrative  . No narrative on file   Immunization History  Administered Date(s) Administered  . Influenza,inj,Quad PF,6+ Mos 06/19/2013, 12/02/2015  . Tdap 09/13/2013, 12/11/2013   No Known Allergies  Review of Systems  Constitutional: Positive for malaise/fatigue.  HENT: Negative.   Eyes: Negative.   Respiratory: Positive for cough.   Cardiovascular: Positive for palpitations. Negative for chest pain.  Gastrointestinal: Negative.   Genitourinary: Negative.    Musculoskeletal: Positive for myalgias.  Skin: Negative.   Neurological: Negative.   Endo/Heme/Allergies: Negative.   Psychiatric/Behavioral: Negative.     Objective:  BP 130/82 (BP Location: Right Arm, Patient Position: Sitting, Cuff Size: Normal)   Pulse 94   Temp 99.1 F (37.3 C) (Oral)   Resp 16   Ht 4\' 10"  (1.473 m)   Wt 144 lb (65.3 kg)   SpO2 100%   BMI 30.10 kg/m   General Appearance:    Alert, cooperative, no distress, appears stated age  Head:    Normocephalic, without obvious abnormality, atraumatic  Eyes:    PERRL, conjunctiva/corneas clear, EOM's intact, fundi    benign, both eyes  Ears:    Normal TM's and external ear canals, both ears  Nose:   Nares normal, septum midline, mucosa normal, no drainage    or sinus tenderness  Throat:   Lips, mucosa, and tongue normal; teeth and gums normal  Neck:   Supple, symmetrical, trachea midline, no adenopathy;    thyroid:  no enlargement/tenderness/nodules; no carotid   bruit or JVD  Back:     Symmetric, no curvature, ROM normal, no CVA tenderness  Lungs:     Clear to auscultation bilaterally, respirations unlabored  Chest Wall:    No tenderness or deformity   Heart:    Regular rate and rhythm, S1 and S2 normal, no murmur, rub   or gallop  Abdomen:     Soft, non-tender, bowel  sounds active all four quadrants,    no masses, no organomegaly  Extremities:   Extremities normal, atraumatic, no cyanosis or edema  Pulses:   2+ and symmetric all extremities  Skin:   Skin color, texture, turgor normal, no rashes or lesions  Lymph nodes:   Cervical, supraclavicular, and axillary nodes normal  Neurologic:   CNII-XII intact, normal strength, sensation and reflexes    throughout    Assessment:        Plan:  1. Other fatigue Review previous labs.  All are within a normal range.  Patient has a history of anemia will recheck CBC on today.  Recommend a balanced diet, patient states that she does not eat many vegetables and does  not get an adequate amount of daily water.  Recommended the patient increase his daily exercise as well.    2.Anemia, unspecified type Patient has a history of iron deficiency anemia.  Will recheck CBC on today to evaluate for anemia.  Recommend an iron rich diet, and continue ferrous sulfate 325 mg daily as previously prescribed. - CBC  3. Cough - guaiFENesin-dextromethorphan (ROBITUSSIN DM) 100-10 MG/5ML syrup; Take 5 mLs by mouth every 6 (six) hours as needed for cough.  Dispense: 118 mL; Refill: 0  4. Language barrier to communication Patient primarily speaks Arabic but understands some [place].  Utilized video interpreter to assist with communication.    Discussed diagnosis with patient. Reassured that serious underlying cause for the fatigue is very unlikely. See orders for lab evaluation.       Donia Pounds  MSN, FNP-C [facility] [address] [place], [place] [address] [phone]

## 2017-02-08 ENCOUNTER — Ambulatory Visit: Payer: Self-pay | Attending: Internal Medicine

## 2017-04-06 ENCOUNTER — Other Ambulatory Visit: Payer: Self-pay

## 2017-04-06 MED FILL — ?METOPROLOL 25 MG TABLET: 25 | 30 days supply | Qty: 60 | Fill #1

## 2017-04-06 MED FILL — ?MONTELUKAST SOD 10MG TAB: 10 | 30 days supply | Qty: 30 | Fill #2

## 2017-04-06 MED FILL — ?CETIRIZINE HCL 10 MG TABLE: 10 | 30 days supply | Qty: 30 | Fill #4

## 2017-04-06 MED FILL — NAPROXEN 500 MG TABLET: 500 | 30 days supply | Qty: 60 | Fill #1

## 2017-04-06 MED FILL — PANTOPRAZOLE SOD DR 20 MG T: 20 | 30 days supply | Qty: 30 | Fill #6

## 2017-04-08 ENCOUNTER — Other Ambulatory Visit: Payer: Self-pay | Admitting: Family Medicine

## 2017-04-08 DIAGNOSIS — R059 Cough, unspecified: Secondary | ICD-10-CM

## 2017-04-08 DIAGNOSIS — R05 Cough: Secondary | ICD-10-CM

## 2017-04-26 ENCOUNTER — Emergency Department (HOSPITAL_COMMUNITY): Payer: Self-pay

## 2017-04-26 ENCOUNTER — Other Ambulatory Visit: Payer: Self-pay

## 2017-04-26 ENCOUNTER — Encounter (HOSPITAL_COMMUNITY): Payer: Self-pay | Admitting: Emergency Medicine

## 2017-04-26 ENCOUNTER — Emergency Department (HOSPITAL_COMMUNITY)
Admission: EM | Admit: 2017-04-26 | Discharge: 2017-04-26 | Disposition: A | Discharge status: Home / Self Care | Payer: Self-pay | Attending: Emergency Medicine | Admitting: Emergency Medicine

## 2017-04-26 DIAGNOSIS — R51 Headache: Secondary | ICD-10-CM | POA: Insufficient documentation

## 2017-04-26 DIAGNOSIS — Z79899 Other long term (current) drug therapy: Secondary | ICD-10-CM | POA: Insufficient documentation

## 2017-04-26 DIAGNOSIS — R0789 Other chest pain: Secondary | ICD-10-CM | POA: Insufficient documentation

## 2017-04-26 DIAGNOSIS — R531 Weakness: Secondary | ICD-10-CM | POA: Insufficient documentation

## 2017-04-26 DIAGNOSIS — R079 Chest pain, unspecified: Secondary | ICD-10-CM

## 2017-04-26 DIAGNOSIS — R519 Headache, unspecified: Secondary | ICD-10-CM

## 2017-04-26 LAB — CBC
HCT: 34.5 % — ABNORMAL LOW (ref 36.0–46.0)
Hemoglobin: 10.9 g/dL — ABNORMAL LOW (ref 12.0–15.0)
MCH: 26.1 pg (ref 26.0–34.0)
MCHC: 31.6 g/dL (ref 30.0–36.0)
MCV: 82.7 fL (ref 78.0–100.0)
Platelets: 412 10*3/uL — ABNORMAL HIGH (ref 150–400)
RBC: 4.17 MIL/uL (ref 3.87–5.11)
RDW: 14.3 % (ref 11.5–15.5)
WBC: 7.5 10*3/uL (ref 4.0–10.5)

## 2017-04-26 LAB — BASIC METABOLIC PANEL
Anion gap: 10 (ref 5–15)
BUN: 11 mg/dL (ref 6–20)
CALCIUM: 9.4 mg/dL (ref 8.9–10.3)
CHLORIDE: 107 mmol/L (ref 101–111)
CO2: 20 mmol/L — ABNORMAL LOW (ref 22–32)
Creatinine, Ser: 0.54 mg/dL (ref 0.44–1.00)
GFR calc non Af Amer: >60 mL/min (ref >60)
Glucose, Bld: 120 mg/dL — ABNORMAL HIGH (ref 65–99)
Potassium: 3.6 mmol/L (ref 3.5–5.1)
SODIUM: 137 mmol/L (ref 135–145)

## 2017-04-26 LAB — I-STAT TROPONIN, ED: Troponin i, poc: 0 ng/mL (ref 0.00–0.08)

## 2017-04-26 LAB — I-STAT BETA HCG BLOOD, ED (MC, WL, AP ONLY)

## 2017-04-26 LAB — TSH: TSH: 1.577 u[IU]/mL (ref 0.350–4.500)

## 2017-04-26 MED ORDER — ACETAMINOPHEN 325 MG PO TABS
650.0000 mg | ORAL_TABLET | Freq: Once | ORAL | Status: AC
  Administered 2017-04-26: 650 mg via ORAL
  Filled 2017-04-26: qty 2

## 2017-04-26 MED ORDER — METOCLOPRAMIDE HCL 5 MG/5ML PO SOLN
10.0000 mg | Freq: Once | ORAL | Status: AC
  Administered 2017-04-26: 10 mg via ORAL
  Filled 2017-04-26: qty 10

## 2017-04-26 MED ORDER — DEXAMETHASONE SODIUM PHOSPHATE 10 MG/ML IJ SOLN
10.0000 mg | Freq: Once | INTRAMUSCULAR | Status: AC
  Administered 2017-04-26: 10 mg via INTRAVENOUS
  Filled 2017-04-26: qty 1

## 2017-04-26 MED ORDER — KETOROLAC TROMETHAMINE 30 MG/ML IJ SOLN
30.0000 mg | Freq: Once | INTRAMUSCULAR | Status: AC
  Administered 2017-04-26: 30 mg via INTRAVENOUS
  Filled 2017-04-26: qty 1

## 2017-04-26 MED ORDER — SODIUM CHLORIDE 0.9 % IV BOLUS (SEPSIS)
1000.0000 mL | Freq: Once | INTRAVENOUS | Status: AC
  Administered 2017-04-26: 1000 mL via INTRAVENOUS

## 2017-04-26 NOTE — ED Notes (Signed)
PA at bedside,  

## 2017-04-26 NOTE — ED Provider Notes (Signed)
[facility] EMERGENCY DEPARTMENT Provider Note   CSN: [id number] Arrival date & time: 04/26/17  1000     History   Chief Complaint Chief Complaint  Patient presents with  . Chest Pain    HPI Betty Cain is a 42 y.o. female.  HPI   Chest Pain 62 age YOF arrives at the emergency department complaining of chest pain, distal paresthesias, right hand weakness and decreased sensation, and bitemporal headache. The chest pain began 2 days ago and is mostly isolated to the posterior thorax at this time. It is described as intermittent and aching.  Patient reports that this feels like prior episodes of chest pain that she has been evaluated for, however this time is more severe.  Patient has a cardiologist, Dr. Daneen Schick, who has evaluated the patient previously for palpitations and chest pain.  Patient has had a normal exercise stress test as well as normal Holter monitor for arrhythmias.  The pain occurs at rest and with ambulation, and is not significantly changed by any activity.  Pt reports nausea without vomiting, diaphoresis, dyspnea, syncope, palpitations, fever, peripheral edema, abdominal pain, cough, or hemoptysis.  Patient reports that she is a family history of open heart surgery in her grandmother between the age of 63 and 18, but no other history of early MI or sudden cardiac death.  Patient denies any estrogen use, shortness of breath, history of DVT/PE, recent treatment for cancer, recent surgical procedures, or recent hospitalization.  No fever, chills, or recent upper respiratory infection.  For patient's distal paresthesias, patient reports that it began in her legs a couple days ago and has progressively escalated to her upper extremities.  This is not associated with limb weakness.  Patient reports that her limbs feel cold.  Additionally, patient reports that her right hand and arm feels slightly more numb and weak.  Patient does not have a history of carpal  tunnel, does not do significant heavy lifting, typing, or other strenuous activities of the limbs.  Patient has no neck pain, or history of neck trauma.  Patient reports that she has had bitemporal headaches for many years.  This current episode is constant and throbbing in nature and also affects the posterior head.  Patient reports that she sometimes gets blurred vision with her headaches, but does not have blurred vision currently.  This particular headache has occurred for 4 days without change.  Patient has previously had the same headaches which have relieved with Tylenol.  History and exam obtained with Arabic interpreter present.  Past Medical History:  Diagnosis Date  . Anemia   . IUD (intrauterine device) in place 09/03/2011   Due out in Aug 15, 2016  . Neonatal death    Vaginal delivery, full term-lived x2 hours.   . Palpitations   . Shortness of breath   . Spinal headache   . Valvular heart disease     Patient Active Problem List   Diagnosis Date Noted  . Chest pain at rest 10/17/2016  . Sinus tachycardia 08/18/2016  . Prolonged Q-T interval on ECG 08/18/2016  . Vertigo 07/22/2015  . Abdominal discomfort 02/14/2015  . Low back pain 02/14/2015  . Generalized abdominal pain 02/01/2015  . Nausea 02/01/2015  . Environmental allergies 02/01/2015  . Language barrier to communication 02/01/2015  . Anemia 01/23/2015  . Prediabetes 01/23/2015  . Constipation 01/21/2015  . Knee pain, bilateral 01/21/2015  . Previous cesarean delivery affecting pregnancy 12/11/2013  . Palpitations 09/18/2013  . Shortness of breath  09/18/2013  . Previous cesarean delivery, delivered, with or without mention of antepartum condition 06/19/2013  . Advanced maternal age in pregnancy in second trimester 06/19/2013    Past Surgical History:  Procedure Laterality Date  . ADENOIDECTOMY     as a child  . boil  2003   right elbow  . CESAREAN SECTION    . CESAREAN SECTION  06/02/2011   Procedure:  CESAREAN SECTION;  Surgeon: [name], MD;  Location: [facility];  Service: Gynecology;  Laterality: N/A;  Primary Cesarean Section Delivery Baby Boy @ 0004, Apgars 9/9  . CESAREAN SECTION N/A 12/10/2013   Procedure: REPEAT CESAREAN SECTION;  Surgeon: [name], MD;  Location: [facility];  Service: Obstetrics;  Laterality: N/A;    OB History    Gravida Para Term Preterm AB Living   5 5 5     4    SAB TAB Ectopic Multiple Live Births           5       Home Medications    Prior to Admission medications   Medication Sig Start Date End Date Taking? Authorizing Provider  acetaminophen (TYLENOL) 500 MG tablet Take 1 tablet (500 mg total) by mouth every 6 (six) hours as needed. 01/21/15  Yes Dorena Dew, FNP  cetirizine (ZYRTEC) 10 MG tablet Take 1 tablet (10 mg total) by mouth daily. 09/14/16  Yes Dorena Dew, FNP  etonogestrel (NEXPLANON) 68 MG IMPL implant 1 each by Subdermal route once.   Yes [provider]  guaiFENesin-dextromethorphan (ROBITUSSIN DM) 100-10 MG/5ML syrup Take 5 mLs by mouth every 6 (six) hours as needed for cough. 02/03/17  Yes Dorena Dew, FNP  metoprolol tartrate (LOPRESSOR) 25 MG tablet Take 1 tablet (25 mg total) by mouth 2 (two) times daily. 12/02/15  Yes Micheline Chapman, NP  naproxen (NAPROSYN) 500 MG tablet TAKE 1 TABLET BY MOUTH 2 TIMES DAILY WITH A MEAL. Patient taking differently: TAKE 1 TABLET BY MOUTH DAILY WITH A MEAL. 02/03/17  Yes Dorena Dew, FNP  pantoprazole (PROTONIX) 20 MG tablet TAKE 1 TABLET BY MOUTH DAILY. 06/04/16  Yes Dorena Dew, FNP  Blood Pressure Monitoring DEVI 1 each by Does not apply route daily. 08/13/16   Dorena Dew, FNP  metoprolol tartrate (LOPRESSOR) 25 MG tablet TAKE 1 TABLET BY MOUTH 2 TIMES DAILY. Patient not taking: Reported on 04/26/2017 02/03/17   Dorena Dew, FNP  montelukast (SINGULAIR) 10 MG tablet TAKE 1 TABLET BY MOUTH AT BEDTIME. Patient not taking: Reported on 02/03/2017  12/15/16   Dorena Dew, FNP    Family History Family History  Problem Relation Age of Onset  . Diabetes Mother   . Diabetes Father   . Diabetes Sister   . Anesthesia problems Neg Hx     Social History Social History   Tobacco Use  . Smoking status: Never Smoker  . Smokeless tobacco: Never Used  Substance Use Topics  . Alcohol use: No  . Drug use: No     Allergies   Patient has no known allergies.   Review of Systems Review of Systems  Constitutional: Negative for chills and fever.  HENT: Negative for congestion, rhinorrhea and sore throat.   Eyes: Negative for visual disturbance.  Respiratory: Positive for chest tightness. Negative for shortness of breath.   Cardiovascular: Positive for chest pain and palpitations. Negative for leg swelling.  Gastrointestinal: Positive for nausea. Negative for abdominal pain and vomiting.  Genitourinary: Negative  for dysuria.  Musculoskeletal: Negative for back pain, neck pain and neck stiffness.  Skin: Negative for rash.  Neurological: Positive for weakness, numbness and headaches.       +paresthesias.     Physical Exam Updated Vital Signs BP 90/66   Pulse 88   Temp 98.7 F (37.1 C) (Oral)   Resp 17   Ht 4\' 10"  (1.473 m)   Wt 67.6 kg (149 lb)   SpO2 100%   BMI 31.14 kg/m   Physical Exam  Constitutional: She appears well-developed and well-nourished. No distress.  HENT:  Head: Normocephalic and atraumatic.  Mouth/Throat: Oropharynx is clear and moist.  Eyes: Conjunctivae and EOM are normal. Pupils are equal, round, and reactive to light.  Neck: Normal range of motion. Neck supple.  Cardiovascular: Normal rate, regular rhythm, S1 normal, S2 normal and intact distal pulses.  No murmur heard. Pulmonary/Chest: Effort normal and breath sounds normal. She has no wheezes. She has no rales.  Abdominal: Soft. She exhibits no distension. There is no tenderness. There is no guarding.  Musculoskeletal: Normal range of  motion. She exhibits no edema or deformity.  Lymphadenopathy:    She has no cervical adenopathy.  Neurological: She is alert.  Mental Status:  Alert, oriented, thought content appropriate, able to give a coherent history. Speech fluent without evidence of aphasia. Able to follow 2 step commands without difficulty.  Cranial Nerves:  II:  Peripheral visual fields grossly normal, pupils equal, round, reactive to light III,IV, VI: ptosis not present, extra-ocular motions intact bilaterally  V,VII: smile symmetric, facial light touch sensation equal VIII: hearing grossly normal to voice  X: uvula elevates symmetrically  XI: bilateral shoulder shrug symmetric and strong XII: midline tongue extension without fassiculations Motor:  Normal tone. 5/5 in upper and lower extremities bilaterally including strong and equal grip strength and dorsiflexion/plantar flexion Sensory: Sensation to light touch intact in all extremities.  Deep Tendon Reflexes: 2+ and symmetric in the biceps and patella.  Cerebellar: normal finger-to-nose with bilateral upper extremities Gait: normal gait and balance Stance: No pronator drift and good coordination, strength, and position sense with tapping of bilateral arms (performed in sitting position). CV: distal pulses palpable throughout   Skin: Skin is warm and dry. No rash noted. No erythema.  Psychiatric: She has a normal mood and affect. Her behavior is normal. Judgment and thought content normal.  Nursing note and vitals reviewed.    ED Treatments / Results  Labs (all labs ordered are listed, but only abnormal results are displayed) Labs Reviewed  BASIC METABOLIC PANEL - Abnormal; Notable for the following components:      Result Value   CO2 20 (*)    Glucose, Bld 120 (*)    All other components within normal limits  CBC - Abnormal; Notable for the following components:   Hemoglobin 10.9 (*)    HCT 34.5 (*)    Platelets 412 (*)    All other components  within normal limits  TSH  I-STAT TROPONIN, ED  I-STAT BETA HCG BLOOD, ED ([facility], [facility], [facility] ONLY)    EKG  EKG Interpretation  Date/Time:  Tuesday April 26 2017 10:06:43 EST Ventricular Rate:  104 PR Interval:  128 QRS Duration: 84 QT Interval:  324 QTC Calculation: 426 R Axis:   52 Text Interpretation:  Sinus tachycardia Right atrial enlargement Nonspecific T wave abnormality No significant change since last tracing Confirmed by Blanchie Dessert [phone]) on 04/26/2017 1:56:01 PM       Radiology  Dg Chest 2 View  Result Date: 04/26/2017 CLINICAL DATA:  Midline to left-sided chest pain and shortness of breath with nonproductive cough. Nonsmoker. EXAM: CHEST  2 VIEW COMPARISON:  CT scan chest of Aug 10, 2016 and chest x-ray of August 09, 2016 FINDINGS: The lungs are well-expanded. There is a stable approximately 2 mm diameter nodule projecting over the anterior aspect of the left second rib. There is no alveolar infiltrate or pleural effusion. The heart and pulmonary vascularity are normal. The mediastinum is normal in width. The bony thorax is unremarkable. IMPRESSION: There is no active cardiopulmonary disease. Electronically Signed   By: David  Martinique M.D.   On: 04/26/2017 11:03    Procedures Procedures (including critical care time)  Medications Ordered in ED Medications  sodium chloride 0.9 % bolus 1,000 mL (1,000 mLs Intravenous New Bag/Given 04/26/17 1511)  ketorolac (TORADOL) 30 MG/ML injection 30 mg (30 mg Intravenous Given 04/26/17 1510)  acetaminophen (TYLENOL) tablet 650 mg (650 mg Oral Given 04/26/17 1519)  metoCLOPramide (REGLAN) 5 MG/5ML solution 10 mg (10 mg Oral Given 04/26/17 1511)  dexamethasone (DECADRON) injection 10 mg (10 mg Intravenous Given 04/26/17 1511)     Initial Impression / Assessment and Plan / ED Course  I have reviewed the triage vital signs and the nursing notes.  Pertinent labs & imaging results that were available during my care of the patient were  reviewed by me and considered in my medical decision making (see chart for details).  Clinical Course as of Apr 26 1649  Tue Apr 26, 2017  1616 Patient reevaluated.  Patient is having improved pain in both head and her chest.  Patient continues to have sensation of tingling, however I reassured patient that her thyroid examination is normal, and the remainder of her examination and laboratory testing is normal.  [AM]    Clinical Course User Index [AM] Albesa Seen, PA-C    Final Clinical Impressions(s) / ED Diagnoses   Final diagnoses:  Left sided chest pain  Bilateral headache   For patient's chest pain, doubt ACS, as delta troponin is negative, initial and repeat EKGs show no signs of ischemia, infarction, or arrhythmia, and HEART score 0. Doubt PE as Well's score 0 and PERC negative, and patient not tachycardic. Doubt TAD by hx, CXR showed no widening mediastinum, and pulses equal in all extremities. Patient remained nontoxic appearing and in no acute distress during emergency department course. Vital signs stable in the emergency department. Therefore, doubt esophageal rupture, cardiac tamponade, or pneumothorax. Pericarditis less likely due to no preceding infectious symptoms and pain not improved in upright positions. Abnormal labs include hemoglobin of 10.9, however this is chronic and stable for patient.  Patient's headache does not have concerning features such as age >41, sudden onset, meningeal signs, or focal neurologic deficits. Patient reports this headache is consistent with prior headaches. Suspect tension headache based on pattern of pain. Headache improved with Toradol, reglan, and tylenol.  Patient's distal paresthesias are not associated with focal neurologic deficits or weakness. Right hand subjective weakness and decreased sensation not associated with neck pain or trauma and is not corroborate on exam with a central cause. TSH normal today and hypothyroidism unlikely to  explain chills, paresthesias, and dizziness.  Patient instructed to follow up with her PCP and cardiologist regarding today's visit. Return precautions given for any new or worsening symptoms, increasing shortness of breath syncope or presyncope, worsening chest pain, or fevers. Patient and family understand and are in agreement with  plan of care.   ED Discharge Orders    None       Tamala Julian 04/27/17 0032    Blanchie Dessert, MD 04/27/17 2143

## 2017-04-26 NOTE — ED Triage Notes (Signed)
Onset one week ago developed chest pain worsening overtime currently 9/10 achy along with shortness of breath on exertion.

## 2017-04-26 NOTE — Discharge Instructions (Signed)
Please read and follow all provided instructions.  Your diagnoses today include:  1. Left sided chest pain   2. Bilateral headache    Your examination of your lungs, your heart, and your neurologic system are reassuring today.  We do not always find the cause of symptoms, however we have ruled out the major things and dangerous things that could cause her symptoms.  Tests performed today include: An EKG of your heart A chest x-ray Cardiac enzymes - a blood test for heart muscle damage Blood counts and electrolytes Vital signs. See below for your results today.  Thyroid function testing was normal today.  Medications prescribed:   Take any prescribed medications only as directed.  Please take Tylenol and ibuprofen for your headache.  Your headache is consistent with a tension headache which often occurs in this pattern on both sides of your head.  Follow-up instructions: Please follow-up with your primary care provider as soon as you can for further evaluation of your symptoms.   Return instructions:  SEEK IMMEDIATE MEDICAL ATTENTION IF: You have severe chest pain, especially if the pain is crushing or pressure-like and spreads to the arms, back, neck, or jaw, or if you have sweating, nausea (feeling sick to your stomach), or shortness of breath. THIS IS AN EMERGENCY. Don't wait to see if the pain will go away. Get medical help at once. Call 911 or 0 (operator). DO NOT drive yourself to the hospital.  Your chest pain gets worse and does not go away with rest.  You have an attack of chest pain lasting longer than usual, despite rest and treatment with the medications your caregiver has prescribed.  You wake from sleep with chest pain or shortness of breath. You feel dizzy or faint. You have chest pain not typical of your usual pain for which you originally saw your caregiver.  You have any other emergent concerns regarding your health. Please follow-up if you see any weakness on one  entire side of the body, numbness on one side of the body, or new or changing abnormal sensations.  Additional Information: Chest pain comes from many different causes. Your caregiver has diagnosed you as having chest pain that is not specific for one problem, but does not require admission.  You are at low risk for an acute heart condition or other serious illness.   Your vital signs today were: BP 90/66    Pulse 88    Temp 98.7 F (37.1 C) (Oral)    Resp 17    Ht 4\' 10"  (1.473 m)    Wt 67.6 kg (149 lb)    SpO2 100%    BMI 31.14 kg/m  If your blood pressure (BP) was elevated above 135/85 this visit, please have this repeated by your doctor within one month. --------------

## 2017-05-04 ENCOUNTER — Ambulatory Visit (INDEPENDENT_AMBULATORY_CARE_PROVIDER_SITE_OTHER): Payer: Self-pay | Admitting: Family Medicine

## 2017-05-04 ENCOUNTER — Encounter: Payer: Self-pay | Admitting: Family Medicine

## 2017-05-04 VITALS — BP 135/83 | HR 105 | Temp 98.6°F | Resp 14 | Ht <= 58 in | Wt 151.0 lb

## 2017-05-04 DIAGNOSIS — G629 Polyneuropathy, unspecified: Secondary | ICD-10-CM

## 2017-05-04 DIAGNOSIS — R202 Paresthesia of skin: Secondary | ICD-10-CM

## 2017-05-04 DIAGNOSIS — Z789 Other specified health status: Secondary | ICD-10-CM

## 2017-05-04 DIAGNOSIS — D649 Anemia, unspecified: Secondary | ICD-10-CM

## 2017-05-04 DIAGNOSIS — R000 Tachycardia, unspecified: Secondary | ICD-10-CM

## 2017-05-04 MED ORDER — GABAPENTIN 100 MG PO CAPS: 100.0000 mg | ORAL_CAPSULE | Freq: Every day | ORAL | 0 refills | Status: DC

## 2017-05-04 MED ORDER — FERROUS SULFATE 325 (65 FE) MG PO TABS
325.0000 mg | ORAL_TABLET | Freq: Every day | ORAL | 3 refills | Status: DC
Start: 2017-05-04 — End: 2017-09-26

## 2017-05-04 MED FILL — GABAPENTIN 100 MG CAPSULE: 100 | 30 days supply | Qty: 30 | Fill #0

## 2017-05-04 MED FILL — FERROUS SULFATE 325 MG TAB: 325 (65 FE) | 30 days supply | Qty: 30 | Fill #0

## 2017-05-04 NOTE — Patient Instructions (Signed)
For worsening distal paresthesias, will send a referral to neurology for further workup and evaluation.  For ongoing neuropathy we will start a trial of gabapentin 100 mg at bedtime. .  Your hemoglobin is 10.9, which is consistent with anemia.  We will start a trial of ferrous sulfate 325 mg daily.  I also recommend an iron rich diet which can consist of green leafy vegetables, meats, and protein sources.

## 2017-05-04 NOTE — Progress Notes (Signed)
Betty Cain, a 42 year old female with a history of valvular heart disease presents complaining of distal paresthesias.  Patient presented to the emergency department on 04/26/2017 for paresthesias to distal extremities.  Patient was treated with IV fluids and discharged home in stable condition.  She states that paresthesias have Betty resolved.  She complains of tingling primarily to lower extremities.  Patient endorses weakness to lower extremities and periodic dizziness.  Paresthesias primarily occurs in the a.m. upon awakening. She denies falls, blurred vision, double vision, headache, chest pain, syncope, shortness of breath, dysuria, nausea, vomiting, or diarrhea.  Past Medical History:  Diagnosis Date  . Anemia   . IUD (intrauterine device) in place 09/03/2011   Due out in 2016-08-31  . Neonatal death    Vaginal delivery, full term-lived x2 hours.   . Palpitations   . Shortness of breath   . Spinal headache   . Valvular heart disease    Social History   Socioeconomic History  . Marital status: Married    Spouse name: Betty Cain  . Number of children: Betty Cain  . Years of education: Betty Cain  . Highest education level: Betty Cain  Social Needs  . Financial resource strain: Betty Cain  . Food insecurity - worry: Betty Cain  . Food insecurity - inability: Betty Cain  . Transportation needs - medical: Betty Cain  . Transportation needs - non-medical: Betty Cain  Occupational History  . Betty Cain  Tobacco Use  . Smoking status: Never Smoker  . Smokeless tobacco: Never Used  Substance and Sexual Activity  . Alcohol use: No  . Drug use: No  . Sexual activity: Yes    Birth control/protection: None    Comment: pregnant  Other Topics Concern  . Betty Cain  Social History Narrative  . Betty Cain    Current Outpatient Medications on Cain Prior to Visit  Medication Sig Dispense Refill  . acetaminophen (TYLENOL) 500 MG tablet Take 1 tablet (500 mg total) by mouth  every 6 (six) hours as needed. 30 tablet 0  . Blood Pressure Monitoring DEVI 1 each by Does Betty apply route daily. 1 Device 0  . cetirizine (ZYRTEC) 10 MG tablet Take 1 tablet (10 mg total) by mouth daily. 30 tablet 11  . etonogestrel (NEXPLANON) 68 MG IMPL implant 1 each by Subdermal route once.    . metoprolol tartrate (LOPRESSOR) 25 MG tablet Take 1 tablet (25 mg total) by mouth 2 (two) times daily. 60 tablet 2  . naproxen (NAPROSYN) 500 MG tablet TAKE 1 TABLET BY MOUTH 2 TIMES DAILY WITH A MEAL. (Patient taking differently: TAKE 1 TABLET BY MOUTH DAILY WITH A MEAL.) 60 tablet 1  . pantoprazole (PROTONIX) 20 MG tablet TAKE 1 TABLET BY MOUTH DAILY. 30 tablet 11  . montelukast (SINGULAIR) 10 MG tablet TAKE 1 TABLET BY MOUTH AT BEDTIME. (Patient Betty taking: Reported on 02/03/2017) 30 tablet 11   No current facility-administered medications on Cain prior to visit.   Review of Systems  HENT: Negative.   Eyes: Negative.   Cardiovascular: Positive for chest pain and claudication.  Gastrointestinal: Negative.   Genitourinary: Negative.   Musculoskeletal: Positive for myalgias.  Neurological: Positive for dizziness and tingling. Negative for focal weakness and seizures.  Psychiatric/Behavioral: Negative.   Physical Exam  Constitutional: She is oriented to person, place, and time.  HENT:  Head: Normocephalic and atraumatic.  Right Ear: External ear  normal.  Left Ear: External ear normal.  Nose: Nose normal.  Mouth/Throat: Oropharynx is clear and moist.  Eyes: Conjunctivae are normal. Pupils are equal, round, and reactive to light.  Cardiovascular: Normal rate, regular rhythm, normal heart sounds and intact distal pulses.  Pulmonary/Chest: Breath sounds normal.  Abdominal: Soft. Bowel sounds are normal.  Musculoskeletal: Normal range of motion.  Neurological: She is alert and oriented to person, place, and time. She is Betty disoriented. She displays weakness and abnormal stance. She displays  no tremor, facial symmetry and normal reflexes. No cranial nerve deficit. She has a normal Cerebellar Exam. She shows pronator drift. Gait normal. Coordination normal.  Reflex Scores:      Tricep reflexes are 2+ on the right side and 2+ on the left side.      Bicep reflexes are 2+ on the right side and 2+ on the left side.      Brachioradialis reflexes are 2+ on the right side and 2+ on the left side.      Patellar reflexes are 2+ on the right side and 2+ on the left side.      Achilles reflexes are 2+ on the right side and 2+ on the left side. Skin: Skin is warm and dry.  Psychiatric: Mood, memory, affect and judgment normal.  Plan   Distal paresthesia Will start a trial of gabapentin 100 mg at bedtime. Patient warrants a referral to neurology for further work up and evaluation.   - gabapentin (NEURONTIN) 100 MG capsule; Take 1 capsule (100 mg total) by mouth at bedtime.  Dispense: 90 capsule; Refill: 0 - Ambulatory referral to Neurology   Neuropathy - gabapentin (NEURONTIN) 100 MG capsule; Take 1 capsule (100 mg total) by mouth at bedtime.  Dispense: 90 capsule; Refill: 0 - Ambulatory referral to Neurology   Language barrier to communication Patient primaily speaks Arabic, utilized video interpreter to assist with communication.   Sinus tachycardia Will continue metoprolol 12.5 mg BID. Continue to follow up as scheduled   Anemia, unspecified type - ferrous sulfate 325 (65 FE) MG tablet; Take 1 tablet (325 mg total) by mouth daily.  Dispense: 30 tablet; Refill: 3   RTC: 3 months for chronic conditions   Donia Pounds  MSN, FNP-C [facility] [address] [place], [place] [address] [phone]

## 2017-06-02 ENCOUNTER — Encounter: Payer: Self-pay | Admitting: Interventional Cardiology

## 2017-06-03 ENCOUNTER — Ambulatory Visit: Payer: No Typology Code available for payment source | Admitting: Family Medicine

## 2017-06-07 ENCOUNTER — Other Ambulatory Visit: Payer: Self-pay | Admitting: Family Medicine

## 2017-06-07 DIAGNOSIS — M25569 Pain in unspecified knee: Secondary | ICD-10-CM

## 2017-06-07 MED FILL — FERROUS SULFATE 325 MG TAB: 325 (65 FE) | 30 days supply | Qty: 30 | Fill #1

## 2017-06-07 MED FILL — ?METOPROLOL 25 MG TABLET: 25 | 30 days supply | Qty: 60 | Fill #2

## 2017-06-07 MED FILL — NAPROXEN 500 MG TABLET: 500 | 30 days supply | Qty: 60 | Fill #0

## 2017-06-07 MED FILL — ?CETIRIZINE HCL 10 MG TABLE: 10 | 30 days supply | Qty: 30 | Fill #5

## 2017-06-07 MED FILL — GABAPENTIN 100 MG CAPSULE: 100 | 30 days supply | Qty: 30 | Fill #1

## 2017-06-07 MED FILL — ?MONTELUKAST SOD 10 MG TAB: 10 | 30 days supply | Qty: 30 | Fill #3

## 2017-06-07 MED FILL — PANTOPRAZOLE SOD DR 20 MG T: 20 | 30 days supply | Qty: 30 | Fill #0

## 2017-06-09 ENCOUNTER — Ambulatory Visit: Payer: Medicaid Other | Admitting: Interventional Cardiology

## 2017-06-10 MED FILL — **BREO ELLIPTA 100-25 MCG I: 100-25MCG | 28 days supply | Qty: 56 | Fill #0

## 2017-06-22 ENCOUNTER — Ambulatory Visit: Payer: Self-pay

## 2017-06-22 ENCOUNTER — Ambulatory Visit: Payer: Self-pay | Attending: Family Medicine

## 2017-06-28 ENCOUNTER — Encounter: Payer: Self-pay | Admitting: Neurology

## 2017-06-28 ENCOUNTER — Other Ambulatory Visit: Payer: Self-pay | Admitting: Neurology

## 2017-06-28 ENCOUNTER — Telehealth: Payer: Self-pay | Admitting: Neurology

## 2017-06-28 ENCOUNTER — Ambulatory Visit (INDEPENDENT_AMBULATORY_CARE_PROVIDER_SITE_OTHER): Payer: Self-pay | Admitting: Neurology

## 2017-06-28 VITALS — BP 121/74 | HR 92 | Ht <= 58 in | Wt 152.5 lb

## 2017-06-28 DIAGNOSIS — G43019 Migraine without aura, intractable, without status migrainosus: Secondary | ICD-10-CM

## 2017-06-28 DIAGNOSIS — R202 Paresthesia of skin: Secondary | ICD-10-CM

## 2017-06-28 HISTORY — DX: Migraine without aura, intractable, without status migrainosus: G43.019

## 2017-06-28 MED ORDER — ALPRAZOLAM 0.5 MG PO TABS: ORAL_TABLET | ORAL | 0 refills | Status: DC

## 2017-06-28 MED ORDER — GABAPENTIN 300 MG PO CAPS: ORAL_CAPSULE | ORAL | 3 refills | Status: DC

## 2017-06-28 MED ORDER — ALPRAZOLAM 0.5 MG PO TABS
ORAL_TABLET | ORAL | 0 refills | Status: DC
Start: 2017-06-28 — End: 2017-06-28

## 2017-06-28 MED FILL — GABAPENTIN 300 MG CAPSULE: 300 | 37 days supply | Qty: 60 | Fill #0

## 2017-06-28 NOTE — Patient Instructions (Signed)
   We will get blood work today and get MRI of the brain. We will increase the gabapentin dose to help the headache.  Start gabapentin 300 mg at night for 2 weeks, then take one twice a day.  Neurontin (gabapentin) may result in drowsiness, ankle swelling, gait instability, or possibly dizziness. Please contact our office if significant side effects occur with this medication.

## 2017-06-28 NOTE — Telephone Encounter (Signed)
[facility] called stating they cannot fill ALPRAZolam (XANAX) 0.5 MG tablet. FYI

## 2017-06-28 NOTE — Telephone Encounter (Signed)
Patient has Medicaid order sent to GI. They will reach out to schedule.

## 2017-06-28 NOTE — Telephone Encounter (Signed)
Called and spoke with Camile from [facility]. She states they only fill tramadol and tylenol#3 controlled substances at their location. Xanax will have to be sent to a different pharmacy.

## 2017-06-28 NOTE — Progress Notes (Signed)
Reason for visit: Paresthesias  Referring physician: Dr. Lanell Persons Betty Cain is a 42 y.o. female  History of present illness:  Betty Cain is a 42 year old right-handed Arabic female with a history of migraine headaches dating back to around 2000.  The patient indicates that her headaches are relatively frequent, they may occur 3 or 4 times a week and may last all day long.  The headaches are bitemporal in nature, they may be associated with some blurring of vision, she may occasionally have some nausea with the headache.  She takes Tylenol with some benefit.  The patient denies any family history of migraine.  She comes in today for evaluation as well of some problems with numbness that has been present for about 3 months.  The patient was seen in the emergency room on 26 April 2017.  She came into the emergency room complaining of numbness of the legs and hands, she also was having chest pain and headache.  The patient does not correlate the numbness with the headache in any way.  The patient reports that the numbness is present below the knees usually in the morning, she may also have some numbness in the hands and on the face.  The face numbness is not daily in nature.  The numbness may come and go, at times she feels normal and at other times the numbness is present.  The patient also has episodes of dizziness that may come and go.  The right hand is more affected than the left, she has some heavy sensations on the right arm as well.  The right leg also feels somewhat more heavy than the left.  She denies any falls, she has not had any troubles controlling the bowels or the bladder.  She may have some occasional blurring of vision with the headache.  She denies any history of anxiety or depression.  She is sent to this office for an evaluation. An interpreter was used during this visit.  Past Medical History:  Diagnosis Date  . Anemia   . Common migraine with intractable migraine 06/28/2017    . IUD (intrauterine device) in place 09/03/2011   Due out in 2016-08-16  . Neonatal death    Vaginal delivery, full term-lived x2 hours.   . Palpitations   . Shortness of breath   . Spinal headache   . Valvular heart disease     Past Surgical History:  Procedure Laterality Date  . ADENOIDECTOMY     as a child  . boil  2003   right elbow  . CESAREAN SECTION     x4  . CESAREAN SECTION  06/02/2011   Procedure: CESAREAN SECTION;  Surgeon: [name], MD;  Location: [facility];  Service: Gynecology;  Laterality: N/A;  Primary Cesarean Section Delivery Baby Boy @ 0004, Apgars 9/9  . CESAREAN SECTION N/A 12/10/2013   Procedure: REPEAT CESAREAN SECTION;  Surgeon: [name], MD;  Location: [facility];  Service: Obstetrics;  Laterality: N/A;    Family History  Problem Relation Age of Onset  . Diabetes Mother   . Diabetes Father   . Diabetes Sister   . Anesthesia problems Neg Hx     Social history:  reports that  has never smoked. she has never used smokeless tobacco. She reports that she does not drink alcohol or use drugs.  Medications:  Prior to Admission medications   Medication Sig Start Date End Date Taking? Authorizing Provider  acetaminophen (TYLENOL) 500 MG tablet  Take 1 tablet (500 mg total) by mouth every 6 (six) hours as needed. 01/21/15  Yes Dorena Dew, FNP  Blood Pressure Monitoring DEVI 1 each by Does not apply route daily. 08/13/16  Yes Dorena Dew, FNP  cetirizine (ZYRTEC) 10 MG tablet Take 1 tablet (10 mg total) by mouth daily. 09/14/16  Yes Dorena Dew, FNP  etonogestrel (NEXPLANON) 68 MG IMPL implant 1 each by Subdermal route once.   Yes [provider]  ferrous sulfate 325 (65 FE) MG tablet Take 1 tablet (325 mg total) by mouth daily. 05/04/17 05/04/18 Yes Dorena Dew, FNP  fluticasone furoate-vilanterol (BREO ELLIPTA) 100-25 MCG/INH AEPB Inhale 1 puff into the lungs daily.   Yes [provider]  ipratropium (ATROVENT) 0.06 %  nasal spray Place 2 sprays into both nostrils as needed for rhinitis.   Yes [provider]  metoprolol tartrate (LOPRESSOR) 25 MG tablet Take 1 tablet (25 mg total) by mouth 2 (two) times daily. 12/02/15  Yes Micheline Chapman, NP  montelukast (SINGULAIR) 10 MG tablet TAKE 1 TABLET BY MOUTH AT BEDTIME. 12/15/16  Yes Dorena Dew, FNP  naproxen (NAPROSYN) 500 MG tablet TAKE 1 TABLET BY MOUTH 2 TIMES DAILY WITH A MEAL. 06/07/17  Yes Dorena Dew, FNP  pantoprazole (PROTONIX) 20 MG tablet TAKE 1 TABLET BY MOUTH DAILY. 06/07/17  Yes Dorena Dew, FNP  ALPRAZolam Duanne Moron) 0.5 MG tablet Take 2 tablets approximately 45 minutes prior to the MRI study, take a third tablet if needed. 06/28/17   Kathrynn Ducking, MD  gabapentin (NEURONTIN) 300 MG capsule One capsule at night for 2 weeks, then take one capsule twice a day 06/28/17   Kathrynn Ducking, MD     No Known Allergies  ROS:  Out of a complete 14 system review of symptoms, the patient complains only of the following symptoms, and all other reviewed systems are negative.  Chest pain, palpitations of the heart Ringing in the ears, dizziness Birthmarks, moles Blurred vision Shortness of breath, cough Constipation Anemia Feeling hot, increased thirst Joint pain, aching muscles Allergies, runny nose Headache, numbness, weakness Not enough sleep, decreased energy  Blood pressure 121/74, pulse 92, height 4\' 10"  (1.473 m), weight 152 lb 8 oz (69.2 kg).  Physical Exam  General: The patient is alert and cooperative at the time of the examination.  The patient is moderately obese.  Eyes: Pupils are equal, round, and reactive to light. Discs are flat bilaterally.  Neck: The neck is supple, no carotid bruits are noted.  Respiratory: The respiratory examination is clear.  Cardiovascular: The cardiovascular examination reveals a regular rate and rhythm, no obvious murmurs or rubs are noted.  Neuromuscular: Range of movement  the cervical spine is full.  Skin: Extremities are without significant edema.  Neurologic Exam  Mental status: The patient is alert and oriented x 3 at the time of the examination. The patient has apparent normal recent and remote memory, with an apparently normal attention span and concentration ability.  Cranial nerves: Facial symmetry is present. There is good sensation of the face to pinprick and soft touch bilaterally. The strength of the facial muscles and the muscles to head turning and shoulder shrug are normal bilaterally. Speech is well enunciated, no aphasia or dysarthria is noted. Extraocular movements are full. Visual fields are full. The tongue is midline, and the patient has symmetric elevation of the soft palate. No obvious hearing deficits are noted.  Motor: The motor testing  reveals 5 over 5 strength of all 4 extremities. Good symmetric motor tone is noted throughout.  Sensory: Sensory testing is intact to pinprick, soft touch, vibration sensation, and position sense on all 4 extremities, with exception of some slight decrease in pinprick sensation on the right leg as compared to the left.  No definite stocking pattern pinprick sensory deficit is noted. No evidence of extinction is noted.  Coordination: Cerebellar testing reveals good finger-nose-finger and heel-to-shin bilaterally.  Gait and station: Gait is normal. Tandem gait is normal. Romberg is negative. No drift is seen.  Reflexes: Deep tendon reflexes are symmetric and normal bilaterally.  The ankle jerk reflexes are well-maintained bilaterally.  Toes are downgoing bilaterally.   Assessment/Plan:  1.  Paresthesias, all 4 extremities and face  2.  Migraine headache, intractable  3.  Episodic dizziness  4.  Noncardiac chest pain  The patient has several somatic complaints.  The clinical examination objectively is normal.  The patient has well-maintained reflexes, likely she does not have a peripheral neuropathy.   Her complaint of numbness also indicates that the sensations will come and go, and are not present at all times.  This suggests a benign etiology.  Given the constellation of symptoms, I would consider the possibility of anxiety or depression underlying some of the symptoms.  The patient will be sent for MRI of the brain to exclude demyelinating disease.  She will have blood work done today.  The patient will be increased on gabapentin going eventually to 300 mg twice daily to help treat the migraine headaches.  She will follow-up in 4 months.  Jill Alexanders MD 06/28/2017 10:05 AM  [facility] [address] [place], [place] [address]  Phone [phone] Fax [phone]

## 2017-06-28 NOTE — Progress Notes (Signed)
CARDIOLOGY OFFICE NOTE  Date:  06/29/2017    Betty Cain Date of Birth: 13-Aug-1975 Medical Record #[id number]  PCP:  Dorena Dew, FNP  Cardiologist:  Tamala Julian  Chief Complaint  Patient presents with  . Chest Pain  . Palpitations    Work in visit - seen for Dr. Tamala Julian    History of Present Illness: Betty Cain is a 42 y.o. female who presents today for a work in visit. Seen for Dr. Tamala Julian. Previously seen by Dr. Gwenlyn Found.   Seen in July of 2018 at request of PCP for palpitations and chest pain. She had had similar complaints in 2015 and work up then showed structurally normal heart by echo and a normal 30 day monitor. CT angio of the chest in 2018 was unremarkable - no coronary calcification.   Seen yesterday by neurology for chronic migraines, paresthesias of all 4 extremities and face. Noted that there was the possibility of anxiety/depression as the culprit. She was to have MRI of the brain and had labs. Gabapentin was increased.   Comes in today. Here with her husband. She does speak [place]. They both do. She notes she continues to have this sharp pain in her chest that is associated with fast heart beating. She has had dizziness - does not sound like she is dizzy when her heart is beating fast. She is on Neurontin. The pain is fleeting. No regular exercise. Sleep?  She loves chocolate. Drinks tea. Busy with 4 kids. For MRI of the head per neurology. She has headaches. She has had ringing in her ears. Does not really mention the paresthesias to me today.   Past Medical History:  Diagnosis Date  . Anemia   . Common migraine with intractable migraine 06/28/2017  . IUD (intrauterine device) in place 09/03/2011   Due out in Aug 13, 2016  . Neonatal death    Vaginal delivery, full term-lived x2 hours.   . Palpitations   . Shortness of breath   . Spinal headache   . Valvular heart disease     Past Surgical History:  Procedure Laterality Date  . ADENOIDECTOMY     as a child  .  boil  2003   right elbow  . CESAREAN SECTION     x4  . CESAREAN SECTION  06/02/2011   Procedure: CESAREAN SECTION;  Surgeon: [name], MD;  Location: [facility];  Service: Gynecology;  Laterality: N/A;  Primary Cesarean Section Delivery Baby Boy @ 0004, Apgars 9/9  . CESAREAN SECTION N/A 12/10/2013   Procedure: REPEAT CESAREAN SECTION;  Surgeon: [name], MD;  Location: [facility];  Service: Obstetrics;  Laterality: N/A;     Medications: Current Meds  Medication Sig  . acetaminophen (TYLENOL) 500 MG tablet Take 1 tablet (500 mg total) by mouth every 6 (six) hours as needed.  . ALPRAZolam (XANAX) 0.5 MG tablet Take 2 tablets approximately 45 minutes prior to the MRI study, take a third tablet if needed.  . Blood Pressure Monitoring DEVI 1 each by Does not apply route daily.  . cetirizine (ZYRTEC) 10 MG tablet Take 1 tablet (10 mg total) by mouth daily.  Marland Kitchen etonogestrel (NEXPLANON) 68 MG IMPL implant 1 each by Subdermal route once.  . ferrous sulfate 325 (65 FE) MG tablet Take 1 tablet (325 mg total) by mouth daily.  . fluticasone furoate-vilanterol (BREO ELLIPTA) 100-25 MCG/INH AEPB Inhale 1 puff into the lungs daily.  Marland Kitchen gabapentin (NEURONTIN) 300 MG capsule One capsule at night  for 2 weeks, then take one capsule twice a day  . ipratropium (ATROVENT) 0.06 % nasal spray Place 2 sprays into both nostrils as needed for rhinitis.  . metoprolol tartrate (LOPRESSOR) 25 MG tablet Take 1 tablet (25 mg total) by mouth 2 (two) times daily.  . montelukast (SINGULAIR) 10 MG tablet TAKE 1 TABLET BY MOUTH AT BEDTIME.  . naproxen (NAPROSYN) 500 MG tablet TAKE 1 TABLET BY MOUTH 2 TIMES DAILY WITH A MEAL.  . pantoprazole (PROTONIX) 20 MG tablet TAKE 1 TABLET BY MOUTH DAILY.     Allergies: No Known Allergies  Social History: The patient  reports that  has never smoked. she has never used smokeless tobacco. She reports that she does not drink alcohol or use drugs.   Family History: The patient's  family history includes Diabetes in her father, mother, and sister.   Review of Systems: Please see the history of present illness.   Otherwise, the review of systems is positive for none.   All other systems are reviewed and negative.   Physical Exam: VS:  BP 128/90 (BP Location: Left Arm, Patient Position: Sitting, Cuff Size: Normal)   Pulse 89   Ht 4\' 10"  (1.473 m)   Wt 153 lb 12.8 oz (69.8 kg)   BMI 32.14 kg/m  .  BMI Body mass index is 32.14 kg/m.  Wt Readings from Last 3 Encounters:  06/29/17 153 lb 12.8 oz (69.8 kg)  06/28/17 152 lb 8 oz (69.2 kg)  05/04/17 151 lb (68.5 kg)    General: Pleasant. Alert and in no acute distress.  She is over weght.  HEENT: Normal.  Neck: Supple, no JVD, carotid bruits, or masses noted.  Cardiac: Regular rate and rhythm. No murmurs, rubs, or gallops. No edema.  Respiratory:  Lungs are clear to auscultation bilaterally with normal work of breathing.  GI: Soft and nontender.  MS: No deformity or atrophy. Gait and ROM intact.  Skin: Warm and dry. Color is normal.  Neuro:  Strength and sensation are intact and no gross focal deficits noted.  Psych: Alert, appropriate and with normal affect.   LABORATORY DATA:  EKG:  EKG is ordered today. This demonstrates NSR  Lab Results  Component Value Date   WBC 7.5 04/26/2017   HGB 10.9 (L) 04/26/2017   HCT 34.5 (L) 04/26/2017   PLT 412 (H) 04/26/2017   GLUCOSE 120 (H) 04/26/2017   CHOL 165 10/08/2014   TRIG 107 10/08/2014   HDL 30 (L) 10/08/2014   LDLCALC 114 (H) 10/08/2014   ALT 12 (L) 08/09/2016   AST 19 08/09/2016   NA 137 04/26/2017   K 3.6 04/26/2017   CL 107 04/26/2017   CREATININE 0.54 04/26/2017   BUN 11 04/26/2017   CO2 20 (L) 04/26/2017   TSH 1.577 04/26/2017   HGBA1C 5.7 12/01/2016     BNP (last 3 results) No results for input(s): BNP in the last 8760 hours.  ProBNP (last 3 results) No results for input(s): PROBNP in the last 8760 hours.   Other Studies Reviewed  Today:  CT Angio 08/10/2016: IMPRESSION: Negative for acute pulmonary embolus or aortic dissection. Clear lung fields.  ECHOCARDIOGRAPHY 2015: Study Conclusions  - Left ventricle: The cavity size was normal. Wall thickness was normal. Systolic function was normal. The estimated ejection fraction was in the range of 60% to 65%.  [place] June 2015:  NSR and ST   Assessment & Plan:  1. Multiple somatic complaints with palpitations and  chest pain - her prior cardiac work up has been benign. I favor CV risk factor modification. Ok to increase the Lopressor. Lab today. Do not feel like further testing is indicated at this time given her past work up.   2. Paresthesias - has neurology work up in progress  3. Chronic headache - per neurology  4. Obesity - my tips given. Exercise is key.   5. Anemia - lab today    Current medicines are reviewed with the patient today.  The patient does not have concerns regarding medicines other than what has been noted above.  The following changes have been made:  See above.  Labs/ tests ordered today include:   No orders of the defined types were placed in this encounter.    Disposition:   FU in about 6 weeks.   Patient is agreeable to this plan and will call if any problems develop in the interim.   SignedTruitt Merle, NP  06/29/2017 10:20 AM  [facility] [address] [place], [place]  [address] Phone: [phone] Fax: [phone]

## 2017-06-29 ENCOUNTER — Encounter: Payer: Self-pay | Admitting: Nurse Practitioner

## 2017-06-29 ENCOUNTER — Telehealth: Payer: Self-pay | Admitting: Neurology

## 2017-06-29 ENCOUNTER — Ambulatory Visit (INDEPENDENT_AMBULATORY_CARE_PROVIDER_SITE_OTHER): Payer: Self-pay | Admitting: Nurse Practitioner

## 2017-06-29 VITALS — BP 128/90 | HR 89 | Ht <= 58 in | Wt 153.8 lb

## 2017-06-29 DIAGNOSIS — R6889 Other general symptoms and signs: Secondary | ICD-10-CM

## 2017-06-29 DIAGNOSIS — R079 Chest pain, unspecified: Secondary | ICD-10-CM

## 2017-06-29 DIAGNOSIS — R002 Palpitations: Secondary | ICD-10-CM

## 2017-06-29 DIAGNOSIS — I471 Supraventricular tachycardia: Secondary | ICD-10-CM

## 2017-06-29 LAB — BASIC METABOLIC PANEL
BUN/Creatinine Ratio: 22 (ref 9–23)
BUN: 12 mg/dL (ref 6–20)
CO2: 21 mmol/L (ref 20–29)
Calcium: 9.1 mg/dL (ref 8.7–10.2)
Chloride: 107 mmol/L — ABNORMAL HIGH (ref 96–106)
Creatinine, Ser: 0.54 mg/dL — ABNORMAL LOW (ref 0.57–1.00)
GFR calc Af Amer: 137 mL/min/{1.73_m2} (ref >59)
GFR calc non Af Amer: 119 mL/min/{1.73_m2} (ref >59)
Glucose: 92 mg/dL (ref 65–99)
Potassium: 4.2 mmol/L (ref 3.5–5.2)
Sodium: 140 mmol/L (ref 134–144)

## 2017-06-29 LAB — CBC
Hematocrit: 32.7 % — ABNORMAL LOW (ref 34.0–46.6)
Hemoglobin: 10.9 g/dL — ABNORMAL LOW (ref 11.1–15.9)
MCH: 26.7 pg (ref 26.6–33.0)
MCHC: 33.3 g/dL (ref 31.5–35.7)
MCV: 80 fL (ref 79–97)
Platelets: 411 10*3/uL — ABNORMAL HIGH (ref 150–379)
RBC: 4.08 x10E6/uL (ref 3.77–5.28)
RDW: 15.9 % — ABNORMAL HIGH (ref 12.3–15.4)
WBC: 6.4 10*3/uL (ref 3.4–10.8)

## 2017-06-29 LAB — RPR: RPR Ser Ql: NONREACTIVE

## 2017-06-29 LAB — SEDIMENTATION RATE: Sed Rate: 100 mm/hr — ABNORMAL HIGH (ref 0–32)

## 2017-06-29 LAB — ANA W/REFLEX: Anti Nuclear Antibody(ANA): NEGATIVE

## 2017-06-29 LAB — VITAMIN B12: VITAMIN B 12: 310 pg/mL (ref 232–1245)

## 2017-06-29 LAB — B. BURGDORFI ANTIBODIES

## 2017-06-29 LAB — HIV ANTIBODY (ROUTINE TESTING W REFLEX): HIV SCREEN 4TH GENERATION: NONREACTIVE

## 2017-06-29 LAB — ANGIOTENSIN CONVERTING ENZYME: Angio Convert Enzyme: 37 U/L (ref 14–82)

## 2017-06-29 MED ORDER — METOPROLOL TARTRATE 25 MG PO TABS: 37.5000 mg | ORAL_TABLET | Freq: Two times a day (BID) | ORAL | 6 refills | Status: DC

## 2017-06-29 MED FILL — ?METOPROLOL TARTRATE 25 MG: 25 | 30 days supply | Qty: 90 | Fill #0

## 2017-06-29 NOTE — Telephone Encounter (Signed)
I called the patient.  The blood work was unremarkable with exception that the sedimentation rate was quite high.  This will need to be rechecked in the future, we may need to add a rheumatoid factor and pan-ANCA panel.  MRI of the brain will be done.

## 2017-06-29 NOTE — Patient Instructions (Addendum)
We will be checking the following labs today - BMET and CBC   Medication Instructions:    Continue with your current medicines. BUT  I am going to increase the Lopressor to 37.5 mg to take twice a day - this will be a pill and a half twice a day    Testing/Procedures To Be Arranged:  N/A  Follow-Up:   See me in 6 weeks      Other Special Instructions:   Really cut back on caffeine - this means less tea and less chocolate Here are my tips to lose weight:  1. Drink only water. You do not need milk, juice, tea, soda or diet soda.  2. Do not eat anything "white". This includes white bread, potatoes, rice or mayo  3. Stay away from fried foods and sweets  4. Your portion should be the size of the palm of your hand.  5. Know what your weaknesses are and avoid.   6. Find an exercise you like and do it every day for 45 to 60 minutes.          If you need a refill on your cardiac medications before your next appointment, please call your pharmacy.   Call the [facility] office at [phone] if you have any questions, problems or concerns.

## 2017-07-14 ENCOUNTER — Other Ambulatory Visit: Payer: Self-pay

## 2017-07-19 MED FILL — ?CETIRIZINE HCL 10 MG TABLE: 10 | 30 days supply | Qty: 30 | Fill #6

## 2017-07-19 MED FILL — FERROUS SULFATE 325 MG TAB: 325 (65 FE) | 30 days supply | Qty: 30 | Fill #2

## 2017-07-19 MED FILL — ?MONTELUKAST SOD 10 MG TAB: 10 | 30 days supply | Qty: 30 | Fill #4

## 2017-07-19 MED FILL — PANTOPRAZOLE SOD DR 20 MG T: 20 | 30 days supply | Qty: 30 | Fill #1

## 2017-07-26 ENCOUNTER — Ambulatory Visit
Admission: RE | Admit: 2017-07-26 | Discharge: 2017-07-26 | Disposition: A | Discharge status: Home / Self Care | Payer: No Typology Code available for payment source | Source: Ambulatory Visit | Attending: Neurology | Admitting: Neurology

## 2017-07-26 DIAGNOSIS — R202 Paresthesia of skin: Secondary | ICD-10-CM

## 2017-07-26 DIAGNOSIS — G43019 Migraine without aura, intractable, without status migrainosus: Secondary | ICD-10-CM

## 2017-08-01 ENCOUNTER — Encounter: Payer: Self-pay | Admitting: Family Medicine

## 2017-08-01 ENCOUNTER — Ambulatory Visit (INDEPENDENT_AMBULATORY_CARE_PROVIDER_SITE_OTHER): Payer: Self-pay | Admitting: Family Medicine

## 2017-08-01 VITALS — BP 111/66 | HR 78 | Temp 98.6°F | Resp 16 | Ht <= 58 in | Wt 156.0 lb

## 2017-08-01 DIAGNOSIS — R1084 Generalized abdominal pain: Secondary | ICD-10-CM

## 2017-08-01 DIAGNOSIS — J3089 Other allergic rhinitis: Secondary | ICD-10-CM

## 2017-08-01 DIAGNOSIS — K219 Gastro-esophageal reflux disease without esophagitis: Secondary | ICD-10-CM

## 2017-08-01 DIAGNOSIS — Z789 Other specified health status: Secondary | ICD-10-CM

## 2017-08-01 LAB — POCT URINE PREGNANCY: PREG TEST UR: NEGATIVE

## 2017-08-01 LAB — POCT URINALYSIS DIPSTICK
BILIRUBIN UA: NEGATIVE
GLUCOSE UA: NEGATIVE
KETONES UA: NEGATIVE
Leukocytes, UA: NEGATIVE
Nitrite, UA: NEGATIVE
Protein, UA: 30
SPEC GRAV UA: 1.02 (ref 1.010–1.025)
Urobilinogen, UA: 0.2 E.U./dL
pH, UA: 7 (ref 5.0–8.0)

## 2017-08-01 MED ORDER — FLUTICASONE PROPIONATE 50 MCG/ACT NA SUSP: 2.0000 | Freq: Every day | NASAL | 6 refills | Status: DC

## 2017-08-01 MED FILL — FLUTICASONE PROP 50 MCG SPR: 50 | 30 days supply | Qty: 16 | Fill #0

## 2017-08-01 NOTE — Progress Notes (Signed)
Subjective:     Betty Cain is a 42 y.o. female who presents for evaluation of abdominal pain.  Initial onset was several weeks ago pain is characterized as intermittent and achy.  Pain is primarily to right upper abdomen. Other associated symptoms include fatigue and diffuse muscle aches.   Patient states that pain is aggravated by eating large meals.  She has been taking pantoprazole 20 mg consistently without sustained relief.  Patient generally is a balanced diet and does not eat fast food. She denies fever headache, dysuria, urinary frequency, incontinence, constipation, or vomiting.  Past Medical History:  Diagnosis Date  . Anemia   . Common migraine with intractable migraine 06/28/2017  . IUD (intrauterine device) in place 09/03/2011   Due out in 2016/08/16  . Neonatal death    Vaginal delivery, full term-lived x2 hours.   . Palpitations   . Shortness of breath   . Spinal headache   . Valvular heart disease    Immunization History  Administered Date(s) Administered  . Influenza,inj,Quad PF,6+ Mos 06/19/2013, 12/02/2015  . Tdap 09/13/2013, 12/11/2013   Social History   Socioeconomic History  . Marital status: Married    Spouse name: [name]  . Number of children: 4  . Years of education: Not on file  . Highest education level: Not on file  Occupational History  . Occupation: Unemployed  Social Needs  . Financial resource strain: Not on file  . Food insecurity:    Worry: Not on file    Inability: Not on file  . Transportation needs:    Medical: Not on file    Non-medical: Not on file  Tobacco Use  . Smoking status: Never Smoker  . Smokeless tobacco: Never Used  Substance and Sexual Activity  . Alcohol use: No  . Drug use: No  . Sexual activity: Yes    Birth control/protection: None    Comment: pregnant  Lifestyle  . Physical activity:    Days per week: Not on file    Minutes per session: Not on file  . Stress: Not on file  Relationships  . Social connections:     Talks on phone: Not on file    Gets together: Not on file    Attends religious service: Not on file    Active member of club or organization: Not on file    Attends meetings of clubs or organizations: Not on file    Relationship status: Not on file  . Intimate partner violence:    Fear of current or ex partner: Not on file    Emotionally abused: Not on file    Physically abused: Not on file    Forced sexual activity: Not on file  Other Topics Concern  . Not on file  Social History Narrative   Lives with husband and child   Caffeine use: daily (tea), sometimes coffee/soda   Right handed    Review of Systems  Constitutional: Positive for malaise/fatigue. Negative for fever.  HENT: Negative.   Respiratory: Negative.   Cardiovascular: Negative.   Gastrointestinal: Positive for abdominal pain and heartburn.  Genitourinary: Negative.  Negative for dysuria, flank pain, frequency, hematuria and urgency.  Musculoskeletal: Negative.  Negative for back pain and joint pain.  Skin: Negative.   Neurological: Negative.  Negative for dizziness and headaches.  Psychiatric/Behavioral: Negative.     Objective:  Physical Exam  Constitutional: She is oriented to person, place, and time. She appears well-developed and well-nourished. She does not appear ill. No distress.  HENT:  Head: Normocephalic.  Mouth/Throat: Oropharynx is clear and moist.  Eyes: Pupils are equal, round, and reactive to light.  Cardiovascular: Normal rate and normal heart sounds.  Abdominal: Normal appearance. There is tenderness in the right upper quadrant.  Genitourinary: Uterus normal.  Neurological: She is alert and oriented to person, place, and time.  Skin: Skin is warm.  Psychiatric: She has a normal mood and affect. Her behavior is normal.    Assessment:   BP 111/66 (BP Location: Left Arm, Patient Position: Sitting, Cuff Size: Large)   Pulse 78   Temp 98.6 F (37 C) (Oral)   Resp 16   Ht 4\' 10"  (1.473 m)    Wt 156 lb (70.8 kg)   LMP 07/24/2017   SpO2 100%   BMI 32.60 kg/m  Plan:  1. Generalized abdominal pain Recommend a bland, low fat diet over the next 24 to 48 hours.  - Urinalysis Dipstick - POCT urine pregnancy - Comprehensive metabolic panel - CBC with Differential - Helicobacter pylori abs-IgG+IgA, bld  2. Gastroesophageal reflux disease without esophagitis Will continue Pantoprazole 20 mg daily.  Recommend small frequent meals throughout the day Also recommend remaining upright 1 hour after eating Provided written information, patient expressed understanding 3. Allergic rhinitis due to other allergic trigger, unspecified seasonality - fluticasone (FLONASE) 50 MCG/ACT nasal spray; Place 2 sprays into both nostrils daily.  Dispense: 16 g; Refill: 6  4. Language barrier to communication Patient accompanied by language interpreter to assist with communication   RTC: Will follow up by phone after reviewing laboratory results   Donia Pounds  MSN, [facility] [address] [place], [place] [address] [phone]

## 2017-08-01 NOTE — Patient Instructions (Signed)
Will follow-up by phone with any abnormal laboratory results. RecommeNd that you start a low-fat diet divided over 3-4 meals throughout the day.  Also, increase water intake and physical activity.  Discussed dairy products at length, discontinue drinking whole milk.  Recommend 1% milk.   For nasal congestion related to allergic rhinitis will start fluticasone 2 sprays to each nostril daily.  Avoid going outdoors or outdoor activities during periods of high pollen index.

## 2017-08-03 ENCOUNTER — Other Ambulatory Visit: Payer: Self-pay | Admitting: Family Medicine

## 2017-08-03 DIAGNOSIS — B9681 Helicobacter pylori [H. pylori] as the cause of diseases classified elsewhere: Secondary | ICD-10-CM

## 2017-08-03 DIAGNOSIS — K2970 Gastritis, unspecified, without bleeding: Principal | ICD-10-CM

## 2017-08-03 LAB — CBC WITH DIFFERENTIAL/PLATELET
BASOS ABS: 0 10*3/uL (ref 0.0–0.2)
Basos: 0 %
EOS (ABSOLUTE): 0.1 10*3/uL (ref 0.0–0.4)
Eos: 2 %
Hematocrit: 35.3 % (ref 34.0–46.6)
Hemoglobin: 11.6 g/dL (ref 11.1–15.9)
IMMATURE GRANS (ABS): 0 10*3/uL (ref 0.0–0.1)
IMMATURE GRANULOCYTES: 0 %
LYMPHS: 41 %
Lymphocytes Absolute: 2.8 10*3/uL (ref 0.7–3.1)
MCH: 26.4 pg — ABNORMAL LOW (ref 26.6–33.0)
MCHC: 32.9 g/dL (ref 31.5–35.7)
MCV: 80 fL (ref 79–97)
MONOCYTES: 5 %
Monocytes Absolute: 0.4 10*3/uL (ref 0.1–0.9)
NEUTROS ABS: 3.5 10*3/uL (ref 1.4–7.0)
NEUTROS PCT: 52 %
Platelets: 444 10*3/uL — ABNORMAL HIGH (ref 150–379)
RBC: 4.39 x10E6/uL (ref 3.77–5.28)
RDW: 15.9 % — ABNORMAL HIGH (ref 12.3–15.4)
WBC: 6.8 10*3/uL (ref 3.4–10.8)

## 2017-08-03 LAB — COMPREHENSIVE METABOLIC PANEL
ALT: 11 IU/L (ref 0–32)
AST: 11 IU/L (ref 0–40)
Albumin/Globulin Ratio: 1.5 (ref 1.2–2.2)
Albumin: 4.5 g/dL (ref 3.5–5.5)
Alkaline Phosphatase: 79 IU/L (ref 39–117)
BUN/Creatinine Ratio: 19 (ref 9–23)
BUN: 11 mg/dL (ref 6–20)
Bilirubin Total: 0.2 mg/dL (ref 0.0–1.2)
CALCIUM: 9.5 mg/dL (ref 8.7–10.2)
CHLORIDE: 107 mmol/L — AB (ref 96–106)
CO2: 20 mmol/L (ref 20–29)
Creatinine, Ser: 0.58 mg/dL (ref 0.57–1.00)
GFR calc non Af Amer: 116 mL/min/{1.73_m2} (ref >59)
GFR, EST AFRICAN AMERICAN: 134 mL/min/{1.73_m2} (ref >59)
GLUCOSE: 110 mg/dL — AB (ref 65–99)
Globulin, Total: 3.1 g/dL (ref 1.5–4.5)
Potassium: 4.5 mmol/L (ref 3.5–5.2)
Sodium: 143 mmol/L (ref 134–144)
TOTAL PROTEIN: 7.6 g/dL (ref 6.0–8.5)

## 2017-08-03 LAB — HELICOBACTER PYLORI ABS-IGG+IGA, BLD: H PYLORI IGG: 3.2 {index_val} — AB (ref 0.00–0.79)

## 2017-08-03 MED ORDER — CLARITHROMYCIN 500 MG PO TABS: 500.0000 mg | ORAL_TABLET | Freq: Two times a day (BID) | ORAL | 0 refills | Status: AC

## 2017-08-03 MED ORDER — AMOXICILLIN 500 MG PO TABS: 1000.0000 mg | ORAL_TABLET | Freq: Two times a day (BID) | ORAL | 0 refills | Status: AC

## 2017-08-03 MED ORDER — PANTOPRAZOLE SODIUM 20 MG PO TBEC: 20.0000 mg | DELAYED_RELEASE_TABLET | Freq: Two times a day (BID) | ORAL | 0 refills | Status: DC

## 2017-08-03 NOTE — Progress Notes (Signed)
Meds ordered this encounter  Medications  . pantoprazole (PROTONIX) 20 MG tablet    Sig: Take 1 tablet (20 mg total) by mouth 2 (two) times daily for 14 days.    Dispense:  28 tablet    Refill:  0  . amoxicillin (AMOXIL) 500 MG tablet    Sig: Take 2 tablets (1,000 mg total) by mouth 2 (two) times daily for 14 days.    Dispense:  56 tablet    Refill:  0  . clarithromycin (BIAXIN) 500 MG tablet    Sig: Take 1 tablet (500 mg total) by mouth 2 (two) times daily for 14 days.    Dispense:  28 tablet    Refill:  0    Donia Pounds  MSN, FNP-C [facility] [address] [place], [place] [address] [phone]

## 2017-08-04 ENCOUNTER — Telehealth: Payer: Self-pay

## 2017-08-04 MED FILL — CLARITHROMYCIN 500 MG TAB: 500 | 14 days supply | Qty: 28 | Fill #0

## 2017-08-04 MED FILL — AMOXICILLIN 500 MG CAPSULE: 500 | 14 days supply | Qty: 56 | Fill #0

## 2017-08-04 MED FILL — PANTOPRAZOLE SOD DR 20 MG T: 20 | 14 days supply | Qty: 28 | Fill #0

## 2017-08-04 NOTE — Telephone Encounter (Signed)
Called and spoke with patient through [facility] [phone]. advised of Positive H.Pylori and that it is transmitted through food, utensils, and water. Advised that she will be prescribed amoxicillin, clarithromycin, and pantoprazole twice daily for 14 days to treat. Advised to keep next scheduled appointment. Patient verbalized understanding. Thanks!

## 2017-08-04 NOTE — Telephone Encounter (Signed)
-----   Message from Dorena Dew, [place] sent at 08/03/2017  7:18 PM EDT ----- Regarding: lab results Please inform patient that test for helicobacter pylori is positive. H. Pylori is a pathogen of the digestive tract. It can be transmitted through food, utensils and water. Often H. Pylori results in stomach ulcers. Will start triple therapy for 14 days, which includes amoxicillin 1000 mg twice daily, Clarithromycin 500 mg twice daily and pantoprozole 20 mg twice daily.   Please follow up in office as previously scheduled.    Donia Pounds  MSN, FNP-C [facility] [address] [place], [place] [address] [phone]

## 2017-08-08 ENCOUNTER — Telehealth: Payer: Self-pay | Admitting: Neurology

## 2017-08-08 NOTE — Telephone Encounter (Signed)
Pt's husband called states she could not complete MRI because she became anxious. She was advised something could be sent to [facility] to help with this. Please call to advise

## 2017-08-09 MED ORDER — ALPRAZOLAM 0.5 MG PO TABS: ORAL_TABLET | ORAL | 0 refills | Status: DC

## 2017-08-09 NOTE — Telephone Encounter (Signed)
[facility].

## 2017-08-09 NOTE — Telephone Encounter (Signed)
Tried calling husband back, unable to reach. Phone continued to ring

## 2017-08-09 NOTE — Telephone Encounter (Signed)
Called, LVM for husband to call back.   Please let him know [facility] cannot fill Xanax for her to take prior to MRI. Need to sent to a different pharmacy. Please ask them which pharmacy they would like [place] to send prescription to

## 2017-08-09 NOTE — Telephone Encounter (Signed)
There is already a xanax order by Dr Jannifer Franklin in the chart for exactly this purpose. Did she fill it? CD

## 2017-08-09 NOTE — Telephone Encounter (Signed)
Pts husband returning RNs call, stating he will call local pharmacy to figure out which one will accept the orange card and give [place] a call back. Grateful for the call

## 2017-08-09 NOTE — Telephone Encounter (Addendum)
Called husband back. Advised rx only for 3 tabs. They can pay out of pocket. Should be inexpensive.   He requested rx be faxed to: [facility]. I faxed to [phone]. Received fax confirmation.

## 2017-08-09 NOTE — Telephone Encounter (Signed)
PLEASE HAVE HER TRY XANAX 1 MG PRIOR TO MRI

## 2017-08-09 NOTE — Telephone Encounter (Signed)
[facility] to inform that they are unable to fill prescriptions for Xanax, RN Myriam Jacobson was notified

## 2017-08-13 ENCOUNTER — Ambulatory Visit
Admission: RE | Admit: 2017-08-13 | Discharge: 2017-08-13 | Disposition: A | Discharge status: Home / Self Care | Payer: No Typology Code available for payment source | Source: Ambulatory Visit | Attending: Neurology | Admitting: Neurology

## 2017-08-13 DIAGNOSIS — G43019 Migraine without aura, intractable, without status migrainosus: Secondary | ICD-10-CM

## 2017-08-13 DIAGNOSIS — R202 Paresthesia of skin: Secondary | ICD-10-CM

## 2017-08-13 MED ORDER — GADOBENATE DIMEGLUMINE 529 MG/ML IV SOLN
13.0000 mL | Freq: Once | INTRAVENOUS | Status: AC | PRN
  Administered 2017-08-13: 13 mL via INTRAVENOUS

## 2017-08-15 ENCOUNTER — Telehealth: Payer: Self-pay | Admitting: Neurology

## 2017-08-15 NOTE — Telephone Encounter (Signed)
I called the patient.  MRI of the brain is normal.  Sensory changes noted by the patient likely to be benign.   MRI brain 08/14/17:  IMPRESSION: This is a normal MRI of the brain with and without contrast.

## 2017-08-22 MED FILL — PANTOPRAZOLE SOD DR 20 MG T: 20 | 30 days supply | Qty: 30 | Fill #2

## 2017-08-22 MED FILL — ?MONTELUKAST SODI 10MG TAB: 10 | 30 days supply | Qty: 30 | Fill #5

## 2017-08-22 MED FILL — FERROUS SULFATE 325 MG TAB: 325 (65 FE) | 30 days supply | Qty: 30 | Fill #3

## 2017-08-22 MED FILL — ?METOPROLOL TARTRATE 25 MG: 25 | 30 days supply | Qty: 90 | Fill #1

## 2017-08-22 MED FILL — GABAPENTIN 300 MG CAPSULE: 300 | 37 days supply | Qty: 60 | Fill #1

## 2017-08-22 MED FILL — ?CETIRIZINE HCL 10 MG TABLE: 10 | 30 days supply | Qty: 30 | Fill #7

## 2017-08-22 MED FILL — NAPROXEN 500 MG TABLET: 500 | 30 days supply | Qty: 60 | Fill #1

## 2017-08-31 ENCOUNTER — Encounter: Payer: Self-pay | Admitting: Nurse Practitioner

## 2017-08-31 ENCOUNTER — Ambulatory Visit (INDEPENDENT_AMBULATORY_CARE_PROVIDER_SITE_OTHER): Payer: No Typology Code available for payment source | Admitting: Nurse Practitioner

## 2017-08-31 VITALS — BP 120/80 | HR 86 | Ht <= 58 in | Wt 159.8 lb

## 2017-08-31 DIAGNOSIS — R079 Chest pain, unspecified: Secondary | ICD-10-CM

## 2017-08-31 NOTE — Patient Instructions (Addendum)
We will be checking the following labs today - NONE   Medication Instructions:    Continue with your current medicines.     Testing/Procedures To Be Arranged:  GXT  Follow-Up:   Will see how your stress test turns out.      Other Special Instructions:   You are scheduled for an Exercise Stress Test on _____________ at ________________.   Please arrive 15 minutes prior to your appointment time for registration and insurance purposes.   The test will take approximately 45 minutes to complete.   How to prepare for your Exercise Stress Test:    Do bring a list of your current medications with you. If not listed below, you may take your medications as normal.    Do not take Metoprolol for 24 hours prior to the test.   Bring any medication held to your appointment as you may be required to take it once the test is complete.   Do wear comfortable closes (no dresses or overalls) and walking shoes - tennis shoes are preferred. No open toed shoes or heels.  Do not wear cologne, perfume, aftershave or lotions (deodorant is allowed).  Please report to the [facility] for your test.   If you have questions or concerns about your appointment, you can call the [facility] at [phone].   If you cannot keep your appointment, please provide 24 hours notification to the [facility] Lab to avoid a possible $50 charge to your account.                         If you need a refill on your cardiac medications before your next appointment, please call your pharmacy.   Call the [facility] office at [phone] if you have any questions, problems or concerns.

## 2017-08-31 NOTE — Progress Notes (Signed)
CARDIOLOGY OFFICE NOTE  Date:  08/31/2017    Betty Cain Date of Birth: 1975/10/15 Medical Record #[id number]  PCP:  Dorena Dew, FNP  Cardiologist:  [facility]  Chief Complaint  Patient presents with  . Chest Pain  . Shortness of Breath    Follow up visit - seen for Dr. Tamala Julian    History of Present Illness: Betty Cain is a 42 y.o. female who presents today for a follow up visit. Seen for Dr. Tamala Julian. Previously seen by Dr. Gwenlyn Found.   Seen in July of 2018 at request of PCP for palpitations and chest pain. She had had similar complaints in 2015 and work up then showed structurally normal heart by echo and a normal 30 day monitor. CT angio of the chest in 2018 was unremarkable - no coronary calcification.   I saw her back in March - she had lots of somatic complaints. She had seen neurology the day before for chronic migraines, paresthesias of all 4 extremities and face. Noted that there was the possibility of anxiety/depression as the culprit. She was to have MRI of the brain and had labs. Gabapentin was increased. I thought her cardiac status was stable. She is quite stressed with 4 kids.   Comes in today. Here with an interpreter today. Still with complaints of chest pain - always at night - not with exertion - described as pressure and then somewhat sharp. Some shortness of breath. No real regular activity other than taking care of her 4 children. She notes her migraines have improved. Her MRI was cancelled.   Past Medical History:  Diagnosis Date  . Anemia   . Common migraine with intractable migraine 06/28/2017  . IUD (intrauterine device) in place 09/03/2011   Due out in 09-05-16  . Neonatal death    Vaginal delivery, full term-lived x2 hours.   . Palpitations   . Shortness of breath   . Spinal headache   . Valvular heart disease     Past Surgical History:  Procedure Laterality Date  . ADENOIDECTOMY     as a child  . boil  2003   right elbow  . CESAREAN  SECTION     x4  . CESAREAN SECTION  06/02/2011   Procedure: CESAREAN SECTION;  Surgeon: [name], MD;  Location: [facility];  Service: Gynecology;  Laterality: N/A;  Primary Cesarean Section Delivery Baby Boy @ 0004, Apgars 9/9  . CESAREAN SECTION N/A 12/10/2013   Procedure: REPEAT CESAREAN SECTION;  Surgeon: [name], MD;  Location: [facility];  Service: Obstetrics;  Laterality: N/A;     Medications: No outpatient medications have been marked as taking for the 08/31/17 encounter (Office Visit) with Burtis Junes, NP.     Allergies: No Known Allergies  Social History: The patient  reports that she has never smoked. She has never used smokeless tobacco. She reports that she does not drink alcohol or use drugs.   Family History: The patient's family history includes Diabetes in her father, mother, and sister.   Review of Systems: Please see the history of present illness.   Otherwise, the review of systems is positive for none.   All other systems are reviewed and negative.   Physical Exam: VS:  BP 120/80 (BP Location: Left Arm, Patient Position: Sitting, Cuff Size: Normal)   Pulse 86   Ht 4\' 10"  (1.473 m)   Wt 159 lb 12.8 oz (72.5 kg)   BMI 33.40 kg/m  .  BMI Body mass index is 33.4 kg/m.  Wt Readings from Last 3 Encounters:  08/31/17 159 lb 12.8 oz (72.5 kg)  08/01/17 156 lb (70.8 kg)  06/29/17 153 lb 12.8 oz (69.8 kg)    General: Alert and in no acute distress.   HEENT: Normal.  Neck: Supple, no JVD, carotid bruits, or masses noted.  Cardiac: Regular rate and rhythm. No murmurs, rubs, or gallops. No edema.  Respiratory:  Lungs are clear to auscultation bilaterally with normal work of breathing.  GI: Soft and nontender.  MS: No deformity or atrophy. Gait and ROM intact.  Skin: Warm and dry. Color is normal.  Neuro:  Strength and sensation are intact and no gross focal deficits noted.  Psych: Alert, appropriate and with normal affect.   LABORATORY  DATA:  EKG:  EKG is ordered today. This demonstrates NSR.  Lab Results  Component Value Date   WBC 6.8 08/01/2017   HGB 11.6 08/01/2017   HCT 35.3 08/01/2017   PLT 444 (H) 08/01/2017   GLUCOSE 110 (H) 08/01/2017   CHOL 165 10/08/2014   TRIG 107 10/08/2014   HDL 30 (L) 10/08/2014   LDLCALC 114 (H) 10/08/2014   ALT 11 08/01/2017   AST 11 08/01/2017   NA 143 08/01/2017   K 4.5 08/01/2017   CL 107 (H) 08/01/2017   CREATININE 0.58 08/01/2017   BUN 11 08/01/2017   CO2 20 08/01/2017   TSH 1.577 04/26/2017   HGBA1C 5.7 12/01/2016     BNP (last 3 results) No results for input(s): BNP in the last 8760 hours.  ProBNP (last 3 results) No results for input(s): PROBNP in the last 8760 hours.   Other Studies Reviewed Today:  CT Angio 08/10/2016: IMPRESSION: Negative for acute pulmonary embolus or aortic dissection. Clear lung fields.  ECHOCARDIOGRAPHY 2015: Study Conclusions  - Left ventricle: The cavity size was normal. Wall thickness was normal. Systolic function was normal. The estimated ejection fraction was in the range of 60% to 65%.  [place] June 2015:  NSR and ST   Assessment & Plan:  1. Chest pain - this has been chronic - she has had prior benign cardiac work up - will arrange for GXT - if this is ok - then reassurance. Needs CV risk factor modification. No changes in medicines for now.   2. Migraines - she says this has improved.   3. Obesity - regular exercise encouraged.   Current medicines are reviewed with the patient today.  The patient does not have concerns regarding medicines other than what has been noted above.  The following changes have been made:  See above.  Labs/ tests ordered today include:    Orders Placed This Encounter  Procedures  . EKG 12-Lead     Disposition:   Further disposition pending.   Patient is agreeable to this plan and will call if any problems develop in the interim.   SignedTruitt Merle, NP  08/31/2017 10:41 AM  [facility] [address] [place], [place]  [address] Phone: [phone] Fax: [phone]

## 2017-09-12 MED FILL — FLUTICASONE PROP 50 MCG SPR: 50 | 30 days supply | Qty: 16 | Fill #1

## 2017-09-15 ENCOUNTER — Ambulatory Visit (INDEPENDENT_AMBULATORY_CARE_PROVIDER_SITE_OTHER): Payer: No Typology Code available for payment source

## 2017-09-15 DIAGNOSIS — R079 Chest pain, unspecified: Secondary | ICD-10-CM

## 2017-09-16 ENCOUNTER — Ambulatory Visit: Payer: Self-pay | Attending: Family Medicine

## 2017-09-16 LAB — EXERCISE TOLERANCE TEST
Estimated workload: 7 METS
Exercise duration (min): 5 min
Exercise duration (sec): 0 s
MPHR: 181 {beats}/min
Peak HR: 164 {beats}/min
Percent HR: 90 %
RPE: 17
Rest HR: 103 {beats}/min

## 2017-09-26 ENCOUNTER — Other Ambulatory Visit: Payer: Self-pay | Admitting: Family Medicine

## 2017-09-26 DIAGNOSIS — D649 Anemia, unspecified: Secondary | ICD-10-CM

## 2017-09-26 DIAGNOSIS — Z9109 Other allergy status, other than to drugs and biological substances: Secondary | ICD-10-CM

## 2017-09-26 DIAGNOSIS — M25569 Pain in unspecified knee: Secondary | ICD-10-CM

## 2017-09-26 MED FILL — FERROUS SULFATE 325 MG TAB: 325 (65 FE) | 30 days supply | Qty: 30 | Fill #0

## 2017-09-26 MED FILL — ?CETIRIZINE HCL 10 MG TABLE: 10 | 30 days supply | Qty: 30 | Fill #0

## 2017-09-26 MED FILL — ?METOPROLOL 25 MG TABLET: 25 | 30 days supply | Qty: 90 | Fill #2

## 2017-09-26 MED FILL — ?MONTELUKAST SODI 10MG TAB: 10 | 30 days supply | Qty: 30 | Fill #6

## 2017-09-26 MED FILL — ?NAPROXEN 500MG TABLET: 500 | 30 days supply | Qty: 60 | Fill #0

## 2017-09-26 MED FILL — PANTOPRAZOLE SOD DR 20 MG T: 20 | 30 days supply | Qty: 30 | Fill #3

## 2017-09-26 MED FILL — GABAPENTIN 300 MG CAPSULE: 300 | 37 days supply | Qty: 60 | Fill #2

## 2017-10-07 ENCOUNTER — Ambulatory Visit: Payer: No Typology Code available for payment source

## 2017-10-31 ENCOUNTER — Ambulatory Visit (INDEPENDENT_AMBULATORY_CARE_PROVIDER_SITE_OTHER): Payer: No Typology Code available for payment source | Admitting: Family Medicine

## 2017-10-31 ENCOUNTER — Encounter: Payer: Self-pay | Admitting: Family Medicine

## 2017-10-31 ENCOUNTER — Ambulatory Visit (HOSPITAL_COMMUNITY)
Admission: RE | Admit: 2017-10-31 | Discharge: 2017-10-31 | Disposition: A | Discharge status: Home / Self Care | Payer: No Typology Code available for payment source | Source: Ambulatory Visit | Attending: Family Medicine | Admitting: Family Medicine

## 2017-10-31 VITALS — BP 116/64 | HR 112 | Temp 99.3°F | Resp 14 | Ht <= 58 in | Wt 158.0 lb

## 2017-10-31 DIAGNOSIS — R339 Retention of urine, unspecified: Secondary | ICD-10-CM

## 2017-10-31 DIAGNOSIS — Z789 Other specified health status: Secondary | ICD-10-CM

## 2017-10-31 DIAGNOSIS — B9681 Helicobacter pylori [H. pylori] as the cause of diseases classified elsewhere: Secondary | ICD-10-CM

## 2017-10-31 DIAGNOSIS — R1084 Generalized abdominal pain: Secondary | ICD-10-CM

## 2017-10-31 DIAGNOSIS — K2970 Gastritis, unspecified, without bleeding: Secondary | ICD-10-CM

## 2017-10-31 LAB — POCT URINALYSIS DIPSTICK
Bilirubin, UA: NEGATIVE
Glucose, UA: NEGATIVE
Ketones, UA: NEGATIVE
Leukocytes, UA: NEGATIVE
Nitrite, UA: NEGATIVE
Protein, UA: POSITIVE — AB
Spec Grav, UA: 1.02 (ref 1.010–1.025)
Urobilinogen, UA: 0.2 E.U./dL
pH, UA: 5 (ref 5.0–8.0)

## 2017-10-31 LAB — POCT URINE PREGNANCY: Preg Test, Ur: NEGATIVE

## 2017-10-31 MED ORDER — DOXYCYCLINE HYCLATE 100 MG PO TABS: 100.0000 mg | ORAL_TABLET | Freq: Two times a day (BID) | ORAL | 0 refills | Status: AC

## 2017-10-31 MED ORDER — OMEPRAZOLE 20 MG PO CPDR: 20.0000 mg | DELAYED_RELEASE_CAPSULE | Freq: Two times a day (BID) | ORAL | 0 refills | Status: DC

## 2017-10-31 MED ORDER — METRONIDAZOLE 500 MG PO TABS: 500.0000 mg | ORAL_TABLET | Freq: Three times a day (TID) | ORAL | 0 refills | Status: AC

## 2017-10-31 MED ORDER — BISMUTH 262 MG PO CHEW: 262.0000 mg | CHEWABLE_TABLET | Freq: Four times a day (QID) | ORAL | 0 refills | Status: AC

## 2017-10-31 MED FILL — ?OMEPRazole 20mg CPDR: 20 | 14 days supply | Qty: 28 | Fill #0

## 2017-10-31 MED FILL — DOXYCYCLINE HYCLATE 100 MG: 100 | 14 days supply | Qty: 28 | Fill #0

## 2017-10-31 MED FILL — ?METRONIDAZOLE 500MG TABS: 500 | 14 days supply | Qty: 42 | Fill #0

## 2017-10-31 NOTE — Progress Notes (Signed)
    Subjective   Betty Cain 42 y.o. female  [id number]  [id number]  [birth year]    Chief Complaint  Patient presents with  . Follow-up  . Abdominal Pain  . Nausea  . Medication Refill    ALL MEDS     Patient presents for follow up on abdominal pain. Patient states that she was diagnosed with H. Pylori and completed  the medications. Pain went away for about 1 month and has returned. Patient reports eating spicy, fried foods. Does not exercise or use a GERD modified diet.  Also states that she is having urinary retention and foul odor with her urine. Denies pain with urination.    Review of Systems  Constitutional: Negative.   Respiratory: Negative.   Cardiovascular: Negative.   Gastrointestinal: Positive for abdominal pain and nausea.  Genitourinary: Negative for dysuria.       + foul smell + retention  Musculoskeletal: Negative.   Neurological: Negative.   Psychiatric/Behavioral: Negative.     Objective   Physical Exam  Constitutional: She is oriented to person, place, and time. She appears well-nourished. No distress.  HENT:  Head: Atraumatic.  Eyes: Pupils are equal, round, and reactive to light. EOM are normal.  Cardiovascular: Normal rate, regular rhythm, normal heart sounds and intact distal pulses.  Pulmonary/Chest: Effort normal and breath sounds normal. No respiratory distress.  Abdominal: Soft. Normal appearance and bowel sounds are normal. She exhibits mass. She exhibits no distension. There is tenderness (epigastric and RLQ LLQ).  Neurological: She is alert and oriented to person, place, and time.  Skin: Skin is warm and dry.  Psychiatric: She has a normal mood and affect. Her behavior is normal.    BP 116/64 (BP Location: Left Arm, Patient Position: Sitting, Cuff Size: Normal)   Pulse (!) 112   Temp 99.3 F (37.4 C) (Oral)   Resp 14   Ht 4\' 10"  (1.473 m)   Wt 158 lb (71.7 kg)   SpO2 100%   BMI 33.02 kg/m   Assessment   Encounter Diagnoses   Name Primary?  . Generalized abdominal pain Yes  . Helicobacter pylori gastritis   . Language barrier to communication   . Urinary retention      Plan  1. Generalized abdominal pain - POCT urine pregnancy - DG Abd 1 View; Future  2. Helicobacter pylori gastritis Retreat for H pylori.  - doxycycline (VIBRA-TABS) 100 MG tablet; Take 1 tablet (100 mg total) by mouth 2 (two) times daily for 14 days.  Dispense: 28 tablet; Refill: 0 - metroNIDAZOLE (FLAGYL) 500 MG tablet; Take 1 tablet (500 mg total) by mouth 3 (three) times daily for 14 days.  Dispense: 42 tablet; Refill: 0 - Bismuth 262 MG CHEW; Chew 262 mg by mouth 4 (four) times daily for 14 days.  Dispense: 56 each; Refill: 0 - omeprazole (PRILOSEC) 20 MG capsule; Take 1 capsule (20 mg total) by mouth 2 (two) times daily for 14 days.  Dispense: 28 capsule; Refill: 0  3. Language barrier to Web designer for this encounter.  4. Urinary retention - Urinalysis Dipstick   This note has been created with Surveyor, quantity. Any transcriptional errors are unintentional.

## 2017-10-31 NOTE — Patient Instructions (Signed)
Helicobacter Pylori Infection  Helicobacter pylori infection is an infection in the stomach that is caused by the Helicobacter pylori (H. pylori) bacteria. This type of bacteria often lives in the lining of the stomach. The infection can cause ulcers and irritation (gastritis) in some people. It is the most common cause of ulcers in the stomach (gastric ulcer) and in the upper part of the intestine (duodenal ulcer). Having this infection may also increase the risk of stomach cancer and a type of white blood cell cancer (lymphoma) that affects the stomach.  What are the causes?  H. pylori is a type of bacteria that is often found in the stomachs of healthy people. The bacteria may be passed from person to person through contact with stool or saliva. It is not known why some people develop ulcers, gastritis, or cancer from the infection.  What increases the risk?  This condition is more likely to develop in people who:  · Have family members with the infection.  · Live with many other people, such as in a dormitory.  · Are of African, Hispanic, or Asian descent.     What are the signs or symptoms?  Most people with this infection do not have symptoms. If you do have symptoms, they may include:  · Heartburn.  · Stomach pain.  · Nausea.  · Vomiting.  · Blood-tinged vomit.  · Loss of appetite.  · Bad breath.     How is this diagnosed?  This condition may be diagnosed based on your symptoms, a physical exam, and various tests. Tests may include:  · Blood tests or stool tests to check for the proteins (antibodies) that your body may produce in response to the bacteria. These tests are the best way to confirm the diagnosis.  · A breath test to check for the type of gas that the H. pylori bacteria release after breaking down a substance called urea. For the test, you are asked to drink urea. This test is often done after treatment in order to find out if the treatment worked.  · A procedure in which a thin, flexible tube  with a tiny camera at the end is placed into your stomach and upper intestine (upper endoscopy). Your health care provider may also take tissue samples (biopsy) to test for H. pylori and cancer.     How is this treated?  Treatment for this condition usually involves taking a combination of medicines (triple therapy) for a couple of weeks. Triple therapy includes one medicine to reduce the acid in your stomach and two types of antibiotic medicines. Many drug combinations have been approved for treatment. Treatment usually kills the H. pylori and reduces your risk of cancer. You may need to be tested for H. pylori again after treatment. In some cases, the treatment may need to be repeated.  Follow these instructions at home:  ·   · Take over-the-counter and prescription medicines only as told by your health care provider.  · Take your antibiotics as told by your health care provider. Do not stop taking the antibiotics even if you start to feel better.  · You can do all your usual activities and eat what you usually do.  · Take steps to prevent future infections:  ? Wash your hands often.  ? Make sure the food you eat has been properly prepared.  ? Drink water only from clean sources.  · Keep all follow-up visits as told by your health care provider. This is important.  Contact   a health care provider if:  · Your symptoms do not get better.  · Your symptoms return after treatment.  This information is not intended to replace advice given to you by your health care provider. Make sure you discuss any questions you have with your health care provider.  Document Released: 07/21/2015 Document Revised: 09/04/2015 Document Reviewed: 04/10/2014  Elsevier Interactive Patient Education © 2018 [employer]   

## 2017-11-04 MED FILL — ?METOPROLOL TARTRATE 25 MG: 25 | 30 days supply | Qty: 90 | Fill #3

## 2017-11-04 MED FILL — NAPROXEN 500 MG TABLET: 500 | 30 days supply | Qty: 60 | Fill #1

## 2017-11-04 MED FILL — ?MONTELUKAST SOD 10MG TAB: 10 | 30 days supply | Qty: 30 | Fill #7

## 2017-11-04 MED FILL — PANTOPRAZOLE SOD DR 20 MG T: 20 | 30 days supply | Qty: 30 | Fill #4

## 2017-11-04 MED FILL — !BREO ELLIPTA 100-25 MCG IN: 100-25 | 30 days supply | Qty: 60 | Fill #1

## 2017-11-04 MED FILL — FLUTICASONE PROP 50 MCG SPR: 50 | 30 days supply | Qty: 16 | Fill #2

## 2017-11-04 MED FILL — FERROUS SULFATE 325 MG TAB: 325 (65 FE) | 30 days supply | Qty: 30 | Fill #1

## 2017-11-04 MED FILL — GABAPENTIN 300 MG CAPSULE: 300 | 37 days supply | Qty: 60 | Fill #3

## 2017-11-08 ENCOUNTER — Ambulatory Visit (INDEPENDENT_AMBULATORY_CARE_PROVIDER_SITE_OTHER): Payer: Medicaid Other | Admitting: Neurology

## 2017-11-08 ENCOUNTER — Encounter: Payer: Self-pay | Admitting: Neurology

## 2017-11-08 VITALS — BP 110/70 | HR 99 | Wt 161.5 lb

## 2017-11-08 DIAGNOSIS — R202 Paresthesia of skin: Secondary | ICD-10-CM

## 2017-11-08 DIAGNOSIS — G43019 Migraine without aura, intractable, without status migrainosus: Secondary | ICD-10-CM

## 2017-11-08 MED ORDER — GABAPENTIN 100 MG PO CAPS: ORAL_CAPSULE | ORAL | 3 refills | Status: DC

## 2017-11-08 NOTE — Progress Notes (Signed)
Reason for visit: Numbness  Betty Cain is an 42 y.o. female  History of present illness:  Betty Cain is a 42 year old right-handed female with a history of migraine headaches.  The patient has had a several month history of migratory intermittent sensory complaints that may affect all 4 extremities and on the face.  The patient will have episodes 2 or 3 times a week, the episodes may last 30 minutes and then disappear.  The patient has more of an underlying chronic fatigue problem, she feels weak all the time as if she is unable to walk or ambulate, but she is able to do this.  The patient has had a very high sedimentation rate on the last blood work checked, sedimentation rate is 100.  The patient has had some chills and night sweats, she denies any fever.  She is currently on antibiotics for an H. pylori infection.  The patient denies any falls, she has reported some pulsatile tinnitus in the right ear.  MRI of the brain done recently was normal.  The patient was placed on gabapentin, but she was unable to tolerate more than 300 mg at night, she does believe that this is helpful.  The patient returns to this office for an evaluation.  Past Medical History:  Diagnosis Date  . Anemia   . Common migraine with intractable migraine 06/28/2017  . IUD (intrauterine device) in place 09/03/2011   Due out in 2016-08-07  . Neonatal death    Vaginal delivery, full term-lived x2 hours.   . Palpitations   . Shortness of breath   . Spinal headache   . Valvular heart disease     Past Surgical History:  Procedure Laterality Date  . ADENOIDECTOMY     as a child  . boil  2003   right elbow  . CESAREAN SECTION     x4  . CESAREAN SECTION  06/02/2011   Procedure: CESAREAN SECTION;  Surgeon: [name], MD;  Location: [facility];  Service: Gynecology;  Laterality: N/A;  Primary Cesarean Section Delivery Baby Boy @ 0004, Apgars 9/9  . CESAREAN SECTION N/A 12/10/2013   Procedure: REPEAT CESAREAN SECTION;   Surgeon: [name], MD;  Location: [facility];  Service: Obstetrics;  Laterality: N/A;    Family History  Problem Relation Age of Onset  . Diabetes Mother   . Diabetes Father   . Diabetes Sister   . Anesthesia problems Neg Hx     Social history:  reports that she has never smoked. She has never used smokeless tobacco. She reports that she does not drink alcohol or use drugs.   No Known Allergies  Medications:  Prior to Admission medications   Medication Sig Start Date End Date Taking? Authorizing Provider  acetaminophen (TYLENOL) 500 MG tablet Take 1 tablet (500 mg total) by mouth every 6 (six) hours as needed. 01/21/15  Yes Dorena Dew, FNP  Bismuth 262 MG CHEW Chew 262 mg by mouth 4 (four) times daily for 14 days. 10/31/17 11/14/17 Yes Lanae Boast, [place]  Blood Pressure Monitoring DEVI 1 each by Does not apply route daily. 08/13/16  Yes Dorena Dew, FNP  cetirizine (ZYRTEC) 10 MG tablet TAKE 1 TABLET BY MOUTH DAILY. 09/26/17  Yes Dorena Dew, FNP  doxycycline (VIBRA-TABS) 100 MG tablet Take 1 tablet (100 mg total) by mouth 2 (two) times daily for 14 days. 10/31/17 11/14/17 Yes Lanae Boast, FNP  etonogestrel (NEXPLANON) 68 MG IMPL implant 1 each by  Subdermal route once.   Yes [provider]  FEROSUL 325 (65 Fe) MG tablet TAKE 1 TABLET (325 MG TOTAL) BY MOUTH DAILY. 09/26/17  Yes Dorena Dew, FNP  fluticasone (FLONASE) 50 MCG/ACT nasal spray Place 2 sprays into both nostrils daily. 08/01/17  Yes Dorena Dew, FNP  fluticasone furoate-vilanterol (BREO ELLIPTA) 100-25 MCG/INH AEPB Inhale 1 puff into the lungs daily.   Yes [provider]  ipratropium (ATROVENT) 0.06 % nasal spray Place 2 sprays into both nostrils as needed for rhinitis.   Yes [provider]  metoprolol tartrate (LOPRESSOR) 25 MG tablet Take 1.5 tablets (37.5 mg total) by mouth 2 (two) times daily. 06/29/17  Yes Burtis Junes, NP  metroNIDAZOLE (FLAGYL) 500 MG tablet  Take 1 tablet (500 mg total) by mouth 3 (three) times daily for 14 days. 10/31/17 11/14/17 Yes Lanae Boast, FNP  naproxen (NAPROSYN) 500 MG tablet TAKE 1 TABLET BY MOUTH 2 TIMES DAILY WITH A MEAL. 09/26/17  Yes Dorena Dew, FNP  omeprazole (PRILOSEC) 20 MG capsule Take 1 capsule (20 mg total) by mouth 2 (two) times daily for 14 days. 10/31/17 11/14/17 Yes Lanae Boast, FNP  gabapentin (NEURONTIN) 100 MG capsule One capsule twice during the day and 3 at night 11/08/17   Kathrynn Ducking, MD    ROS:  Out of a complete 14 system review of symptoms, the patient complains only of the following symptoms, and all other reviewed systems are negative.  Numbness Chronic fatigue  Blood pressure 110/70, pulse 99, weight 161 lb 8 oz (73.3 kg), SpO2 98 %.  Physical Exam  General: The patient is alert and cooperative at the time of the examination.  The patient is mildly obese.  Ears: Tympanic membranes are clear bilaterally.  Skin: No significant peripheral edema is noted.   Neurologic Exam  Mental status: The patient is alert and oriented x 3 at the time of the examination. The patient has apparent normal recent and remote memory, with an apparently normal attention span and concentration ability.   Cranial nerves: Facial symmetry is present. Speech is normal, no aphasia or dysarthria is noted. Extraocular movements are full. Visual fields are full.  Motor: The patient has good strength in all 4 extremities.  Sensory examination: Soft touch sensation is symmetric on the face, arms, and legs.  Coordination: The patient has good finger-nose-finger and heel-to-shin bilaterally.  Gait and station: The patient has a normal gait. Tandem gait is normal. Romberg is negative. No drift is seen.  Reflexes: Deep tendon reflexes are symmetric.   Assessment/Plan:  1.  Migratory sensory complaints  2.  Elevated sedimentation rate  3.  Chronic fatigue  4.  History of migraine headache  The  patient will be sent for further blood work today, we will recheck the sedimentation rate and a CRP.  The patient will be placed on gabapentin 100 mg capsules taking 1 twice during the day and 3 at night.  A prescription was sent in.  She will follow-up in 5 months.  Betty Alexanders MD 11/08/2017 10:13 AM  [facility] [address] [place], [place] [address]  Phone [phone] Fax [phone]

## 2017-11-08 NOTE — Patient Instructions (Signed)
We will increase the gabapentin to 100 mg taking one twice a day and 3 at night.

## 2017-11-10 ENCOUNTER — Telehealth: Payer: Self-pay | Admitting: Neurology

## 2017-11-10 LAB — PAN-ANCA
ANCA Proteinase 3: <3.5 U/mL (ref 0.0–3.5)
Atypical pANCA: <1:20 {titer}
P-ANCA: <1:20 {titer}

## 2017-11-10 LAB — RHEUMATOID FACTOR

## 2017-11-10 LAB — CK: CK TOTAL: 76 U/L (ref 24–173)

## 2017-11-10 LAB — SEDIMENTATION RATE: Sed Rate: 59 mm/hr — ABNORMAL HIGH (ref 0–32)

## 2017-11-10 LAB — C-REACTIVE PROTEIN: CRP: 23 mg/L — AB (ref 0–10)

## 2017-11-10 LAB — CYCLIC CITRUL PEPTIDE ANTIBODY, IGG/IGA: Cyclic Citrullin Peptide Ab: <1 units (ref 0–19)

## 2017-11-10 MED FILL — GABAPENTIN 100 MG CAPSULE: 100 | 30 days supply | Qty: 150 | Fill #0

## 2017-11-10 NOTE — Telephone Encounter (Signed)
I called the patient, talk with husband.  The blood work continues to show an elevated sedimentation rate, CRP, but the sedimentation rate has significantly improved, we will continue to follow.  Otherwise, the blood work was unremarkable, the source of the elevated sedimentation rate is not clear.

## 2017-12-13 ENCOUNTER — Other Ambulatory Visit: Payer: Self-pay | Admitting: Neurology

## 2017-12-13 ENCOUNTER — Other Ambulatory Visit: Payer: Self-pay | Admitting: Family Medicine

## 2017-12-13 DIAGNOSIS — M25569 Pain in unspecified knee: Secondary | ICD-10-CM

## 2017-12-13 MED FILL — !BREO ELLIPTA 100-25 MCG IN: 100-25 | 30 days supply | Qty: 60 | Fill #2

## 2017-12-13 MED FILL — FERROUS SULFATE 325 MG TAB: 325 (65 FE) | 30 days supply | Qty: 30 | Fill #2

## 2017-12-13 MED FILL — NAPROXEN 500 MG TABLET: 500 | 30 days supply | Qty: 60 | Fill #0

## 2017-12-13 MED FILL — ?METOPROLOL TARTRATE 25 MG: 25 | 30 days supply | Qty: 90 | Fill #4

## 2017-12-13 MED FILL — PANTOPRAZOLE SOD DR 20 MG T: 20 | 30 days supply | Qty: 30 | Fill #5

## 2017-12-13 MED FILL — ?MONTELUKAST SOD 10MG TAB: 10 | 30 days supply | Qty: 30 | Fill #8

## 2017-12-13 MED FILL — FLUTICASONE PROP 50 MCG SPR: 50 | 30 days supply | Qty: 16 | Fill #3

## 2017-12-20 MED FILL — ?CETIRIZINE HCL 10 MG TABLE: 10 | 30 days supply | Qty: 30 | Fill #1

## 2017-12-22 ENCOUNTER — Encounter: Payer: Self-pay | Admitting: Family Medicine

## 2017-12-22 ENCOUNTER — Ambulatory Visit (INDEPENDENT_AMBULATORY_CARE_PROVIDER_SITE_OTHER): Payer: No Typology Code available for payment source | Admitting: Family Medicine

## 2017-12-22 VITALS — BP 118/86 | HR 100 | Temp 99.2°F | Ht <= 58 in | Wt 159.0 lb

## 2017-12-22 DIAGNOSIS — K219 Gastro-esophageal reflux disease without esophagitis: Secondary | ICD-10-CM

## 2017-12-22 DIAGNOSIS — R300 Dysuria: Secondary | ICD-10-CM

## 2017-12-22 DIAGNOSIS — M25562 Pain in left knee: Secondary | ICD-10-CM

## 2017-12-22 DIAGNOSIS — Z9109 Other allergy status, other than to drugs and biological substances: Secondary | ICD-10-CM

## 2017-12-22 DIAGNOSIS — M25561 Pain in right knee: Secondary | ICD-10-CM

## 2017-12-22 DIAGNOSIS — R1084 Generalized abdominal pain: Secondary | ICD-10-CM

## 2017-12-22 DIAGNOSIS — M722 Plantar fascial fibromatosis: Secondary | ICD-10-CM

## 2017-12-22 LAB — POCT URINALYSIS DIP (MANUAL ENTRY)
Bilirubin, UA: NEGATIVE
Glucose, UA: NEGATIVE mg/dL
Ketones, POC UA: NEGATIVE mg/dL
Leukocytes, UA: NEGATIVE
Nitrite, UA: NEGATIVE
Protein Ur, POC: 100 mg/dL — AB
Spec Grav, UA: >=1.03 — AB (ref 1.010–1.025)
Urobilinogen, UA: 0.2 E.U./dL
pH, UA: 5 (ref 5.0–8.0)

## 2017-12-22 MED ORDER — MELOXICAM 7.5 MG PO TABS: 7.5000 mg | ORAL_TABLET | Freq: Every day | ORAL | 0 refills | Status: DC

## 2017-12-22 MED ORDER — LEVOCETIRIZINE DIHYDROCHLORIDE 5 MG PO TABS: 5.0000 mg | ORAL_TABLET | Freq: Every evening | ORAL | 2 refills | Status: DC

## 2017-12-22 MED ORDER — RANITIDINE HCL 150 MG PO TABS
150.0000 mg | ORAL_TABLET | Freq: Two times a day (BID) | ORAL | 2 refills | Status: DC
Start: 2017-12-22 — End: 2018-01-03

## 2017-12-22 MED ORDER — PANTOPRAZOLE SODIUM 40 MG PO TBEC: 40.0000 mg | DELAYED_RELEASE_TABLET | Freq: Every day | ORAL | 3 refills | Status: DC

## 2017-12-22 MED ORDER — AMOXICILLIN-POT CLAVULANATE 875-125 MG PO TABS: 1.0000 | ORAL_TABLET | Freq: Two times a day (BID) | ORAL | 0 refills | Status: AC

## 2017-12-22 MED ORDER — METHYLPREDNISOLONE 4 MG PO TBPK
ORAL_TABLET | ORAL | 0 refills | Status: DC
Start: 2017-12-22 — End: 2018-01-03

## 2017-12-22 MED FILL — MELOXICAM 7.5 MG TABLET: 7.5 | 30 days supply | Qty: 30 | Fill #0

## 2017-12-22 MED FILL — METHYLPREDNISOLONE 4 MG TAB: 4 | 6 days supply | Qty: 21 | Fill #0

## 2017-12-22 MED FILL — LEVOCETIRIZINE 5 MG TABLET: 5 | 30 days supply | Qty: 30 | Fill #0

## 2017-12-22 MED FILL — raNITIdine HCL 150 MG TABS: 150 | 30 days supply | Qty: 60 | Fill #0

## 2017-12-22 MED FILL — AMOX-CLAV 875-125 MG TABLET: 875-125 | 7 days supply | Qty: 14 | Fill #0

## 2017-12-22 NOTE — Patient Instructions (Signed)
I am sending in a referral for a stomach specialist. I also sent in a new allergy medication XYZAL and an antibiotic for your sinus infection. I sent a new pain medication for you call meloxicam. Stop the naproxen.   Knee Pain, Adult Many things can cause knee pain. The pain often goes away on its own with time and rest. If the pain does not go away, tests may be done to find out what is causing the pain. Follow these instructions at home: Activity  Rest your knee.  Do not do things that cause pain.  Avoid activities where both feet leave the ground at the same time (high-impact activities). Examples are running, jumping rope, and doing jumping jacks. General instructions  Take medicines only as told by your doctor.  Raise (elevate) your knee when you are resting. Make sure your knee is higher than your heart.  Sleep with a pillow under your knee.  If told, put ice on the knee: ? Put ice in a plastic bag. ? Place a towel between your skin and the bag. ? Leave the ice on for 20 minutes, 2-3 times a day.  Ask your doctor if you should wear an elastic knee support.  Lose weight if you are overweight. Being overweight can make your knee hurt more.  Do not use any tobacco products. These include cigarettes, chewing tobacco, or electronic cigarettes. If you need help quitting, ask your doctor. Smoking may slow down healing. Contact a doctor if:  The pain does not stop.  The pain changes or gets worse.  You have a fever along with knee pain.  Your knee gives out or locks up.  Your knee swells, and becomes worse. Get help right away if:  Your knee feels warm.  You cannot move your knee.  You have very bad knee pain.  You have chest pain.  You have trouble breathing. Summary  Many things can cause knee pain. The pain often goes away on its own with time and rest.  Avoid activities that put stress on your knee. These include running and jumping rope.  Get help right  away if you cannot move your knee, or if your knee feels warm, or if you have trouble breathing. This information is not intended to replace advice given to you by your health care provider. Make sure you discuss any questions you have with your health care provider. Document Released: 06/25/2008 Document Revised: 03/23/2016 Document Reviewed: 03/23/2016 Elsevier Interactive Patient Education  2017 Elsevier Inc. Plantar Fasciitis Plantar fasciitis is a painful foot condition that affects the heel. It occurs when the band of tissue that connects the toes to the heel bone (plantar fascia) becomes irritated. This can happen after exercising too much or doing other repetitive activities (overuse injury). The pain from plantar fasciitis can range from mild irritation to severe pain that makes it difficult for you to walk or move. The pain is usually worse in the morning or after you have been sitting or lying down for a while. What are the causes? This condition may be caused by:  Standing for long periods of time.  Wearing shoes that do not fit.  Doing high-impact activities, including running, aerobics, and ballet.  Being overweight.  Having an abnormal way of walking (gait).  Having tight calf muscles.  Having high arches in your feet.  Starting a new athletic activity.  What are the signs or symptoms? The main symptom of this condition is heel pain. Other symptoms  include:  Pain that gets worse after activity or exercise.  Pain that is worse in the morning or after resting.  Pain that goes away after you walk for a few minutes.  How is this diagnosed? This condition may be diagnosed based on your signs and symptoms. Your health care provider will also do a physical exam to check for:  A tender area on the bottom of your foot.  A high arch in your foot.  Pain when you move your foot.  Difficulty moving your foot.  You may also need to have imaging studies to confirm the  diagnosis. These can include:  X-rays.  Ultrasound.  MRI.  How is this treated? Treatment for plantar fasciitis depends on the severity of the condition. Your treatment may include:  Rest, ice, and over-the-counter pain medicines to manage your pain.  Exercises to stretch your calves and your plantar fascia.  A splint that holds your foot in a stretched, upward position while you sleep (night splint).  Physical therapy to relieve symptoms and prevent problems in the future.  Cortisone injections to relieve severe pain.  Extracorporeal shock wave therapy (ESWT) to stimulate damaged plantar fascia with electrical impulses. It is often used as a last resort before surgery.  Surgery, if other treatments have not worked after 12 months.  Follow these instructions at home:  Take medicines only as directed by your health care provider.  Avoid activities that cause pain.  Roll the bottom of your foot over a bag of ice or a bottle of cold water. Do this for 20 minutes, 3-4 times a day.  Perform simple stretches as directed by your health care provider.  Try wearing athletic shoes with air-sole or gel-sole cushions or soft shoe inserts.  Wear a night splint while sleeping, if directed by your health care provider.  Keep all follow-up appointments with your health care provider. How is this prevented?  Do not perform exercises or activities that cause heel pain.  Consider finding low-impact activities if you continue to have problems.  Lose weight if you need to. The best way to prevent plantar fasciitis is to avoid the activities that aggravate your plantar fascia. Contact a health care provider if:  Your symptoms do not go away after treatment with home care measures.  Your pain gets worse.  Your pain affects your ability to move or do your daily activities. This information is not intended to replace advice given to you by your health care provider. Make sure you  discuss any questions you have with your health care provider. Document Released: 12/22/2000 Document Revised: 09/01/2015 Document Reviewed: 02/06/2014 Elsevier Interactive Patient Education  2018 [employer]. Gastroesophageal Reflux Disease, Adult Normally, food travels down the esophagus and stays in the stomach to be digested. If a person has gastroesophageal reflux disease (GERD), food and stomach acid move back up into the esophagus. When this happens, the esophagus becomes sore and swollen (inflamed). Over time, GERD can make small holes (ulcers) in the lining of the esophagus. Follow these instructions at home: Diet  Follow a diet as told by your doctor. You may need to avoid foods and drinks such as: ? Coffee and tea (with or without caffeine). ? Drinks that contain alcohol. ? Energy drinks and sports drinks. ? Carbonated drinks or sodas. ? Chocolate and cocoa. ? Peppermint and mint flavorings. ? Garlic and onions. ? Horseradish. ? Spicy and acidic foods, such as peppers, chili powder, curry powder, vinegar, hot sauces, and BBQ  sauce. ? Citrus fruit juices and citrus fruits, such as oranges, lemons, and limes. ? Tomato-based foods, such as red sauce, chili, salsa, and pizza with red sauce. ? Fried and fatty foods, such as donuts, french fries, potato chips, and high-fat dressings. ? High-fat meats, such as hot dogs, rib eye steak, sausage, ham, and bacon. ? High-fat dairy items, such as whole milk, butter, and cream cheese.  Eat small meals often. Avoid eating large meals.  Avoid drinking large amounts of liquid with your meals.  Avoid eating meals during the 2-3 hours before bedtime.  Avoid lying down right after you eat.  Do not exercise right after you eat. General instructions  Pay attention to any changes in your symptoms.  Take over-the-counter and prescription medicines only as told by your doctor. Do not take aspirin, ibuprofen, or other NSAIDs unless your  doctor says it is okay.  Do not use any tobacco products, including cigarettes, chewing tobacco, and e-cigarettes. If you need help quitting, ask your doctor.  Wear loose clothes. Do not wear anything tight around your waist.  Raise (elevate) the head of your bed about 6 inches (15 cm).  Try to lower your stress. If you need help doing this, ask your doctor.  If you are overweight, lose an amount of weight that is healthy for you. Ask your doctor about a safe weight loss goal.  Keep all follow-up visits as told by your doctor. This is important. Contact a doctor if:  You have new symptoms.  You lose weight and you do not know why it is happening.  You have trouble swallowing, or it hurts to swallow.  You have wheezing or a cough that keeps happening.  Your symptoms do not get better with treatment.  You have a hoarse voice. Get help right away if:  You have pain in your arms, neck, jaw, teeth, or back.  You feel sweaty, dizzy, or light-headed.  You have chest pain or shortness of breath.  You throw up (vomit) and your throw up looks like blood or coffee grounds.  You pass out (faint).  Your poop (stool) is bloody or black.  You cannot swallow, drink, or eat. This information is not intended to replace advice given to you by your health care provider. Make sure you discuss any questions you have with your health care provider. Document Released: 09/15/2007 Document Revised: 09/04/2015 Document Reviewed: 07/24/2014 Elsevier Interactive Patient Education  [employer].

## 2017-12-22 NOTE — Progress Notes (Signed)
Patient [facility]   Progress Note: General Provider: Lanae Boast, FNP  SUBJECTIVE:   Betty Cain is a 42 y.o. female who  has a past medical history of Anemia, Common migraine with intractable migraine (06/28/2017), IUD (intrauterine device) in place (10/03/11), Neonatal death, Palpitations, Shortness of breath, Spinal headache, and Valvular heart disease.. Patient presents today for Abdominal Pain; Leg Pain (bilateral); and Foot Pain  Patient states that continues to have abdominal pain. Has been treated for H. Pylori x 2. Pain is worsened with eating. Patient cannot identify a specific food trigger. Patient states that the pain is not always associated with food. States an increase in gas and early satiety. Patient reports emesis x 4 over the past few weeks.  Patient also reports bilateral knee pain x 2 weeks and right heel pain x 2 weeks. Patient states that she has taken tylenol without relief.   Review of Systems  Constitutional: Negative.   HENT: Negative.   Eyes: Negative.   Respiratory: Negative.   Cardiovascular: Negative.   Gastrointestinal: Positive for abdominal pain, heartburn and vomiting.  Genitourinary: Negative.   Musculoskeletal: Positive for joint pain (bilateral knees and right heel).  Skin: Negative.   Neurological: Negative.   Psychiatric/Behavioral: Negative.      OBJECTIVE: BP 118/86 (BP Location: Left Arm, Patient Position: Sitting, Cuff Size: Small)   Pulse 100   Temp 99.2 F (37.3 C) (Oral)   Ht 4\' 10"  (1.473 m)   Wt 159 lb (72.1 kg)   SpO2 98%   BMI 33.23 kg/m   Physical Exam  Constitutional: She is oriented to person, place, and time. She appears well-developed and well-nourished. No distress.  HENT:  Head: Normocephalic and atraumatic.  Nose: Mucosal edema, rhinorrhea and sinus tenderness present. Right sinus exhibits frontal sinus tenderness. Left sinus exhibits frontal sinus tenderness.    Mouth/Throat: Oropharynx is clear and moist.  Eyes: Pupils are equal, round, and reactive to light. Conjunctivae and EOM are normal.  Neck: Normal range of motion.  Cardiovascular: Normal rate, regular rhythm, normal heart sounds and intact distal pulses.  Pulmonary/Chest: Effort normal and breath sounds normal. No respiratory distress.  Abdominal: Soft. Bowel sounds are normal. She exhibits no distension. There is tenderness in the epigastric area and left upper quadrant.  Musculoskeletal: Normal range of motion.  Neurological: She is alert and oriented to person, place, and time.  Skin: Skin is warm and dry.  Psychiatric: She has a normal mood and affect. Her behavior is normal. Thought content normal.  Nursing note and vitals reviewed.   ASSESSMENT/PLAN:  1. Generalized abdominal pain Referral to GI.  - POCT urinalysis dipstick  2. Gastroesophageal reflux disease, esophagitis presence not specified Discussed decreasing spicy foods and weight loss.  - pantoprazole (PROTONIX) 40 MG tablet; Take 1 tablet (40 mg total) by mouth daily.  Dispense: 30 tablet; Refill: 3 - ranitidine (ZANTAC) 150 MG tablet; Take 1 tablet (150 mg total) by mouth 2 (two) times daily.  Dispense: 60 tablet; Refill: 2 - Ambulatory referral to Gastroenterology  3. Environmental allergies Stop zyrtec - levocetirizine (XYZAL) 5 MG tablet; Take 1 tablet (5 mg total) by mouth every evening.  Dispense: 30 tablet; Refill: 2  4. Acute pain of both knees Stop Naproxen. Start mobic po QD.  - methylPREDNISolone (MEDROL DOSEPAK) 4 MG TBPK tablet; Take as directed on pack.  Dispense: 21 tablet; Refill: 0 - meloxicam (MOBIC) 7.5 MG tablet; Take 1 tablet (7.5 mg total) by  mouth daily.  Dispense: 30 tablet; Refill: 0  5. Plantar fasciitis Exercises given.  - methylPREDNISolone (MEDROL DOSEPAK) 4 MG TBPK tablet; Take as directed on pack.  Dispense: 21 tablet; Refill: 0       The patient was given clear instructions  to go to ER or return to medical center if symptoms do not improve, worsen or new problems develop. The patient verbalized understanding and agreed with plan of care.   [name]. Nathaneil Canary, FNP-BC [facility] [address] [place], [place] [address] [phone]     This note has been created with Dragon speech recognition software and smart phrase technology. Any transcriptional errors are unintentional.

## 2017-12-27 ENCOUNTER — Encounter: Payer: Self-pay | Admitting: Gastroenterology

## 2018-01-03 ENCOUNTER — Other Ambulatory Visit (INDEPENDENT_AMBULATORY_CARE_PROVIDER_SITE_OTHER): Payer: No Typology Code available for payment source

## 2018-01-03 ENCOUNTER — Ambulatory Visit (INDEPENDENT_AMBULATORY_CARE_PROVIDER_SITE_OTHER): Payer: No Typology Code available for payment source | Admitting: Gastroenterology

## 2018-01-03 ENCOUNTER — Encounter: Payer: Self-pay | Admitting: Gastroenterology

## 2018-01-03 VITALS — BP 114/78 | HR 84 | Ht <= 58 in | Wt 161.0 lb

## 2018-01-03 DIAGNOSIS — K219 Gastro-esophageal reflux disease without esophagitis: Secondary | ICD-10-CM

## 2018-01-03 DIAGNOSIS — R1013 Epigastric pain: Secondary | ICD-10-CM

## 2018-01-03 DIAGNOSIS — K5900 Constipation, unspecified: Secondary | ICD-10-CM

## 2018-01-03 DIAGNOSIS — Z8619 Personal history of other infectious and parasitic diseases: Secondary | ICD-10-CM

## 2018-01-03 DIAGNOSIS — R1010 Upper abdominal pain, unspecified: Secondary | ICD-10-CM

## 2018-01-03 LAB — CBC WITH DIFFERENTIAL/PLATELET
BASOS ABS: 0.1 10*3/uL (ref 0.0–0.1)
Basophils Relative: 0.6 % (ref 0.0–3.0)
EOS ABS: 0.2 10*3/uL (ref 0.0–0.7)
Eosinophils Relative: 2.2 % (ref 0.0–5.0)
HEMATOCRIT: 34.6 % — AB (ref 36.0–46.0)
Hemoglobin: 11.5 g/dL — ABNORMAL LOW (ref 12.0–15.0)
LYMPHS PCT: 35.9 % (ref 12.0–46.0)
Lymphs Abs: 3.4 10*3/uL (ref 0.7–4.0)
MCHC: 33.3 g/dL (ref 30.0–36.0)
MCV: 83.8 fl (ref 78.0–100.0)
MONOS PCT: 4.8 % (ref 3.0–12.0)
Monocytes Absolute: 0.5 10*3/uL (ref 0.1–1.0)
NEUTROS PCT: 56.5 % (ref 43.0–77.0)
Neutro Abs: 5.3 10*3/uL (ref 1.4–7.7)
Platelets: 344 10*3/uL (ref 150.0–400.0)
RBC: 4.12 Mil/uL (ref 3.87–5.11)
RDW: 14.6 % (ref 11.5–15.5)
WBC: 9.4 10*3/uL (ref 4.0–10.5)

## 2018-01-03 MED ORDER — PANTOPRAZOLE SODIUM 40 MG PO TBEC: 40.0000 mg | DELAYED_RELEASE_TABLET | Freq: Two times a day (BID) | ORAL | 3 refills | Status: DC

## 2018-01-03 NOTE — Patient Instructions (Signed)
If you are age 40 or older, your body mass index should be between 23-30. Your Body mass index is 33.65 kg/m. If this is out of the aforementioned range listed, please consider follow up with your Primary Care Provider.  If you are age 51 or younger, your body mass index should be between 19-25. Your Body mass index is 33.65 kg/m. If this is out of the aformentioned range listed, please consider follow up with your Primary Care Provider.   You have been scheduled for an endoscopy. Please follow written instructions given to you at your visit today. If you use inhalers (even only as needed), please bring them with you on the day of your procedure. Your physician has requested that you go to www.startemmi.com and enter the access code given to you at your visit today. This web site gives a general overview about your procedure. However, you should still follow specific instructions given to you by our office regarding your preparation for the procedure.  Please go to the lab in the basement of our building to have lab work done as you leave today.   Discontinue taking NSAID's.  Use Tylenol for pain.  Increase your Protonix 40 mg to twice a day.  Discontibue using Zantac (ranitidine).  Thank you for entrusting me with your care and for choosing [facility], Dr. [place] Cellar

## 2018-01-03 NOTE — Progress Notes (Signed)
HPI :  42 year old Arabic speaking female, with a history of H. Pylori, headaches, referred here by Lanae Boast, [place] for abdominal pain.  History obtained through translation service.  The patient endorses abdominal pains for the past few months. She localizes this to the epigastric and right upper quadrant area, although feels that on bilateral upper abdominal area. It appears to come and go, but she feels it every day. Discomfort does not appear to be related to eating at all. She does have some symptoms when going to bed at night, but this does not typically wake her up. She does have some reflux symptoms that have bothered her previously, this has been treated with Protonix recently 40 g once daily, and her reflux has resolved, however her upper abdominal pain has not. She is also been taking Zantac 150 mg once a day. She reports her stools have been dark in recent weeks, she states black in color at times. She has roughly one bowel movement per day. No bright red blood in the stool. She has had some constipation over time, using a stool softener which helps her use the bathroom once daily. She does think that her symptoms can at times be relieved with a bowel movement.  She had a positive H. Pylori IgG serology in 2022/08/20. She states she was treated with antibiotics for this and did find some improvement with symptoms at the time she was treated, however symptoms quickly returned. Of note she does use both meloxicam and Naprosyn every day for knee pains, she takes Naprosyn twice daily and meloxicam once daily. She's never had a prior upper endoscopy or colonoscopy. She had a remote ultrasound of her abdomen in 2015 which did not show any evidence of gallstones. She's had a workup otherwise with labs which has been unrevealing thus far.    Past Medical History:  Diagnosis Date  . Anemia   . Common migraine with intractable migraine 06/28/2017  . IUD (intrauterine device) in place 09/03/2011   Due out in 08-19-2016  . Neonatal death    Vaginal delivery, full term-lived x2 hours.   . Palpitations   . Shortness of breath   . Spinal headache   . Valvular heart disease      Past Surgical History:  Procedure Laterality Date  . ADENOIDECTOMY     as a child  . boil  2003   right elbow  . CESAREAN SECTION     x4  . CESAREAN SECTION  06/02/2011   Procedure: CESAREAN SECTION;  Surgeon: [name], MD;  Location: [facility];  Service: Gynecology;  Laterality: N/A;  Primary Cesarean Section Delivery Baby Boy @ 0004, Apgars 9/9  . CESAREAN SECTION N/A 12/10/2013   Procedure: REPEAT CESAREAN SECTION;  Surgeon: [name], MD;  Location: [facility];  Service: Obstetrics;  Laterality: N/A;   Family History  Problem Relation Age of Onset  . Diabetes Mother   . Diabetes Father   . Diabetes Sister   . Anesthesia problems Neg Hx    Social History   Tobacco Use  . Smoking status: Never Smoker  . Smokeless tobacco: Never Used  Substance Use Topics  . Alcohol use: No  . Drug use: No   Current Outpatient Medications  Medication Sig Dispense Refill  . acetaminophen (TYLENOL) 500 MG tablet Take 1 tablet (500 mg total) by mouth every 6 (six) hours as needed. 30 tablet 0  . Blood Pressure Monitoring DEVI 1 each by Does not apply route  daily. 1 Device 0  . cetirizine (ZYRTEC) 10 MG tablet TAKE 1 TABLET BY MOUTH DAILY. 30 tablet 11  . etonogestrel (NEXPLANON) 68 MG IMPL implant 1 each by Subdermal route once.    . FEROSUL 325 (65 Fe) MG tablet TAKE 1 TABLET (325 MG TOTAL) BY MOUTH DAILY. 30 tablet 3  . fluticasone (FLONASE) 50 MCG/ACT nasal spray Place 2 sprays into both nostrils daily. 16 g 6  . fluticasone furoate-vilanterol (BREO ELLIPTA) 100-25 MCG/INH AEPB Inhale 1 puff into the lungs daily.    Marland Kitchen gabapentin (NEURONTIN) 100 MG capsule One capsule twice during the day and 3 at night 150 capsule 3  . ipratropium (ATROVENT) 0.06 % nasal spray Place 2 sprays into both nostrils as needed for  rhinitis.    Marland Kitchen levocetirizine (XYZAL) 5 MG tablet Take 1 tablet (5 mg total) by mouth every evening. 30 tablet 2  . meloxicam (MOBIC) 7.5 MG tablet Take 1 tablet (7.5 mg total) by mouth daily. 30 tablet 0  . metoprolol tartrate (LOPRESSOR) 25 MG tablet Take 1.5 tablets (37.5 mg total) by mouth 2 (two) times daily. 90 tablet 6  . naproxen (NAPROSYN) 500 MG tablet TAKE 1 TABLET BY MOUTH 2 TIMES DAILY WITH A MEAL. 60 tablet 1  . pantoprazole (PROTONIX) 40 MG tablet Take 1 tablet (40 mg total) by mouth daily. 30 tablet 3  . ranitidine (ZANTAC) 150 MG tablet Take 1 tablet (150 mg total) by mouth 2 (two) times daily. 60 tablet 2   No current facility-administered medications for this visit.    No Known Allergies   Review of Systems: All systems reviewed and negative except where noted in HPI.   Lab Results  Component Value Date   WBC 6.8 08/01/2017   HGB 11.6 08/01/2017   HCT 35.3 08/01/2017   MCV 80 08/01/2017   PLT 444 (H) 08/01/2017    Lab Results  Component Value Date   CREATININE 0.58 08/01/2017   BUN 11 08/01/2017   NA 143 08/01/2017   K 4.5 08/01/2017   CL 107 (H) 08/01/2017   CO2 20 08/01/2017    Lab Results  Component Value Date   ALT 11 08/01/2017   AST 11 08/01/2017   ALKPHOS 79 08/01/2017   BILITOT 0.2 08/01/2017     Physical Exam: BP 114/78   Pulse 84   Ht 4\' 10"  (1.473 m)   Wt 161 lb (73 kg)   BMI 33.65 kg/m  Constitutional: Pleasant,well-developed, female in no acute distress. HEENT: Normocephalic and atraumatic. Conjunctivae are normal. No scleral icterus. Neck supple.  Cardiovascular: Normal rate, regular rhythm.  Pulmonary/chest: Effort normal and breath sounds normal. No wheezing, rales or rhonchi. Abdominal: Soft, nondistended, mild epigastric TTP, There are no masses palpable. No hepatomegaly. Extremities: no edema Lymphadenopathy: No cervical adenopathy noted. Neurological: Alert and oriented to person place and time. Skin: Skin is warm and  dry. No rashes noted. Psychiatric: Normal mood and affect. Behavior is normal.   ASSESSMENT AND PLAN: 42 year old female here for new patient assessment the following issues:  Upper abdominal pain / history of H. Pylori / constipation - she has a history of H. Pylori which was treated with what appears to be triple therapy (PPI, amoxicillin, clarithromycin) in April, as well as a history of significant NSAID use. Peptic ulcer disease and persistent H. Pylori infection remain high on the differential. Her symptoms are not consistent with biliary colic. She's had some dark stools recently, going to obtain a CBC today to  ensure no anemia. I advised her to stop all NSAIDs and to use Tylenol as needed for her pain. I'm recommending an upper endoscopy to further evaluate these symptoms, rule out PUD and H pylori. I discussed the risks and benefits of endoscopy and anesthesia with her, we had an opening to do this tomorrow and she wanted to proceed. In the interim we will increase Protonix to 40 mg twice daily and stop the Zantac. Further recommendations pending the results of her endoscopy. She should continue the stool softener as this is helping her constipation. If her EGD is unremarkable and symptoms persist despite the regimen as outlined, we'll consider cross-sectional imaging with CT scan.  The patient and her family verbalized understanding of the plan and agreed to proceed. All questions answered.  Cruger Cellar, MD [facility]  CC: Lanae Boast, [place]

## 2018-01-04 ENCOUNTER — Encounter: Payer: Self-pay | Admitting: Gastroenterology

## 2018-01-04 ENCOUNTER — Ambulatory Visit (AMBULATORY_SURGERY_CENTER): Payer: No Typology Code available for payment source | Admitting: Gastroenterology

## 2018-01-04 ENCOUNTER — Other Ambulatory Visit: Payer: No Typology Code available for payment source

## 2018-01-04 ENCOUNTER — Other Ambulatory Visit: Payer: Self-pay

## 2018-01-04 VITALS — BP 119/79 | HR 105 | Temp 98.6°F | Resp 13 | Ht <= 58 in | Wt 161.0 lb

## 2018-01-04 DIAGNOSIS — Z8619 Personal history of other infectious and parasitic diseases: Secondary | ICD-10-CM

## 2018-01-04 DIAGNOSIS — K2970 Gastritis, unspecified, without bleeding: Secondary | ICD-10-CM

## 2018-01-04 DIAGNOSIS — R1010 Upper abdominal pain, unspecified: Secondary | ICD-10-CM

## 2018-01-04 MED ORDER — SUCRALFATE 1 GM/10ML PO SUSP: 1.0000 g | Freq: Four times a day (QID) | ORAL | 1 refills | Status: DC | PRN

## 2018-01-04 MED ORDER — SODIUM CHLORIDE 0.9 % IV SOLN: 500.0000 mL | Freq: Once | INTRAVENOUS | Status: DC

## 2018-01-04 MED FILL — CARAFATE 1 GM/10 ML SUSP: 1 | 14 days supply | Qty: 420 | Fill #0

## 2018-01-04 NOTE — Progress Notes (Signed)
Pt with partial laryngospasm, holding jaw thrust, oropharynx suctioned, O2 via [place] increased to 12 L. Positive pressure held  And NAW inserted atraumatically. pt suctioned again. Spasm broke , continue to support ventilation with bag mask at 100%.  Continue to suction patient as needed. Patient ventilating well on own, changed to nonrebreather and patient taken to pacu. Patient alert and oriented x , ventilating well , sats 99% on room air, pt coughing pt given albuterol treatment. Explained to patient and RN the need to stop case, all questions answered, All vital signs stable.

## 2018-01-04 NOTE — Progress Notes (Signed)
History reviewed today   Interpreter used today Educational psychologist)

## 2018-01-04 NOTE — Progress Notes (Addendum)
Called to room to assist during endoscopic procedure.  Patient ID and intended procedure confirmed with present staff. Received instructions for my participation in the procedure from the performing physician.   Procedure began 10:01 I was paged to procedure room 1 to assist with pathology specimen. Per Dr. Havery Moros Gastric antrum & body bx - mild gastritis seen  bx to r/o h. Pylori.  Pt's sat began to drop and pulse ox was repositioned. Heide Scales, CRNA supported pt's airway. CRNA asked Dr. Havery Moros to withdraw endo scope. Scope removed immediately.  O2 via nasal canula increased to 12 liter per minute. Called for additional help in procedure room.  PPV per CRNA.  Corey Wall. CRNA placed nasal trumpet in pt's right nare and continued PPV.  Pt's sats began to increase. Pt was suctioned multiple times.  Additional Robinal 0.1 mg was administer IV.  Pt was sats stable and VSS.  Pt Switched O2 15 liter per minute via non-rebreather  face mask.  Pt was transported to the recovery room for further monitoring.  After pt was transported [place] reported the bx was not taken d/t sats dropping.  No specimens. MAW

## 2018-01-04 NOTE — Op Note (Signed)
[facility] Patient Name: Betty Cain Procedure Date: 01/04/2018 9:57 AM MRN: [id number] Endoscopist: Remo Lipps P. Havery Moros , MD Age: 42 Referring MD:  Date of Birth: 09/19/1975 Gender: Female Account #: [id number] Procedure:                Upper GI endoscopy Indications:              Upper abdominal pain, history of H pylori (treated                            for positive serology with triple therapy a few                            months ago) Medicines:                Monitored Anesthesia Care Procedure:                Pre-Anesthesia Assessment:                           - Prior to the procedure, a History and Physical                            was performed, and patient medications and                            allergies were reviewed. The patient's tolerance of                            previous anesthesia was also reviewed. The risks                            and benefits of the procedure and the sedation                            options and risks were discussed with the patient.                            All questions were answered, and informed consent                            was obtained. Prior Anticoagulants: The patient has                            taken no previous anticoagulant or antiplatelet                            agents. ASA Grade Assessment: II - A patient with                            mild systemic disease. After reviewing the risks                            and benefits, the patient was deemed in  satisfactory condition to undergo the procedure.                           After obtaining informed consent, the endoscope was                            passed under direct vision. Throughout the                            procedure, the patient's blood pressure, pulse, and                            oxygen saturations were monitored continuously. The                            Endoscope was introduced through the  mouth, and                            advanced to the second part of duodenum. The upper                            GI endoscopy was accomplished without difficulty.                            The patient tolerated the procedure poorly due to                            the patient's respiratory instability (hypoxia). Scope In: Scope Out: Findings:                 The esophagus was not well evaluated given issues                            as outlined below, but appeared normal during                            intubation.                           Patchy mildly erythematous mucosa was found in the                            gastric antrum without any focal ulcerations.                           Of the rest of the stomach that was visualized, it                            appeared normal although the procedure was                            terminated prematurely due to hypoxia. It appeared                            the patient  developed laryngospasm shortly after                            the exam started and had rapid desaturation,                            managed per anesthesia with nasal trumpet and bag                            mask and patient responded quickly with resolution                            of hypoxia. Retroflexed views were not obtained and                            biopsies for H pylori not taken given the procedure                            was aborted. We elected not to sedate the patient                            again to complete the exam given the suspected                            laryngospasm.                           The duodenal bulb and second portion of the                            duodenum were normal. Complications:            Hypoxia as above due to suspected laryngospasm Estimated Blood Loss:     Estimated blood loss: none. Estimated blood loss:                            none. Impression:               - Limited exam of the esophagus  appeared normal.                           - Erythematous mucosa in the antrum. Full exam not                            completed due to transient hypoxia thought to be                            due to laryngospasm, the procedure was aborted                            prematurely                           - Normal duodenal bulb and second portion of the  duodenum.                           - No obvious pathology noted. Recommendation:           - Patient has a contact number available for                            emergencies. The signs and symptoms of potential                            delayed complications were discussed with the                            patient. Return to normal activities tomorrow.                            Written discharge instructions were provided to the                            patient.                           - Resume previous diet.                           - Continue present medications.                           - Would hold protonix for 2 weeks and then submit                            stool antigen for H pylori eradication testing -                            this should be done given patient's previous                            treatment regimen and mild gastritis noted on this                            exa                           - Trial of carafate 1gm every 6 hrs prn while                            holding protonix Georgi Navarrete P. Shavonna Corella, MD 01/04/2018 10:25:09 AM This report has been signed electronically.

## 2018-01-04 NOTE — Patient Instructions (Signed)
YOU HAD AN ENDOSCOPIC PROCEDURE TODAY AT [facility]:   Refer to the procedure report that was given to you for any specific questions about what was found during the examination.  If the procedure report does not answer your questions, please call your gastroenterologist to clarify.  If you requested that your care partner not be given the details of your procedure findings, then the procedure report has been included in a sealed envelope for you to review at your convenience later.  YOU SHOULD EXPECT: Some feelings of bloating in the abdomen. Passage of more gas than usual.  Walking can help get rid of the air that was put into your GI tract during the procedure and reduce the bloating. If you had a lower endoscopy (such as a colonoscopy or flexible sigmoidoscopy) you may notice spotting of blood in your stool or on the toilet paper. If you underwent a bowel prep for your procedure, you may not have a normal bowel movement for a few days.  Please Note:  You might notice some irritation and congestion in your nose or some drainage.  This is from the oxygen used during your procedure.  There is no need for concern and it should clear up in a day or so.  SYMPTOMS TO REPORT IMMEDIATELY:    Following upper endoscopy (EGD)  Vomiting of blood or coffee ground material  New chest pain or pain under the shoulder blades  Painful or persistently difficult swallowing  New shortness of breath  Fever of 100F or higher  Black, tarry-looking stools  For urgent or emergent issues, a gastroenterologist can be reached at any hour by calling [phone].   DIET:  We do recommend a small meal at first, but then you may proceed to your regular diet.  Drink plenty of fluids but you should avoid alcoholic beverages for 24 hours.  ACTIVITY:  You should plan to take it easy for the rest of today and you should NOT DRIVE or use heavy machinery until tomorrow (because of the sedation medicines used  during the test).    FOLLOW UP: Our staff will call the number listed on your records the next business day following your procedure to check on you and address any questions or concerns that you may have regarding the information given to you following your procedure. If we do not reach you, we will leave a message.  However, if you are feeling well and you are not experiencing any problems, there is no need to return our call.  We will assume that you have returned to your regular daily activities without incident.  If any biopsies were taken you will be contacted by phone or by letter within the next 1-3 weeks.  Please call us at [phone] if you have not heard about the biopsies in 3 weeks.    SIGNATURES/CONFIDENTIALITY: You and/or your care partner have signed paperwork which will be entered into your electronic medical record.  These signatures attest to the fact that that the information above on your After Visit Summary has been reviewed and is understood.  Full responsibility of the confidentiality of this discharge information lies with you and/or your care-partner.  Hold protonix for 2 weeks.  Then submit Stool Antigen for H Pylori eradication testing.   Use carafarte one tablet every 6 hours while not taking protonix.

## 2018-01-05 ENCOUNTER — Telehealth: Payer: Self-pay

## 2018-01-05 NOTE — Telephone Encounter (Signed)
  Follow up Call-  Call back number 01/04/2018  Post procedure Call Back phone  # [phone]   Permission to leave phone message Yes  Some recent data might be hidden     Patient questions:  Do you have a fever, pain , or abdominal swelling? No. Pain Score  0 *  Have you tolerated food without any problems? Yes.    Have you been able to return to your normal activities? Yes.    Do you have any questions about your discharge instructions: Diet   No. Medications  No. Follow up visit  No.  Do you have questions or concerns about your Care? No.  Actions: * If pain score is 4 or above: No action needed, pain <4.  Very difficult communication.  Language barrier.

## 2018-01-09 ENCOUNTER — Telehealth: Payer: Self-pay

## 2018-01-09 NOTE — Telephone Encounter (Signed)
Called, no answer. Left a message for patient to call back regarding medication problem. Thanks!

## 2018-01-09 NOTE — Telephone Encounter (Signed)
Patient called back. She states she is having pain in her knees and I advised we would need to see her for this. She was transferred to the front desk to schedule an appointment. Thanks!

## 2018-01-12 ENCOUNTER — Other Ambulatory Visit: Payer: Self-pay

## 2018-01-12 ENCOUNTER — Other Ambulatory Visit: Payer: Self-pay | Admitting: Family Medicine

## 2018-01-12 ENCOUNTER — Encounter: Payer: Self-pay | Admitting: Family Medicine

## 2018-01-12 ENCOUNTER — Ambulatory Visit (INDEPENDENT_AMBULATORY_CARE_PROVIDER_SITE_OTHER): Payer: Self-pay | Admitting: Family Medicine

## 2018-01-12 VITALS — BP 133/72 | HR 106 | Temp 98.8°F | Resp 14 | Ht <= 58 in | Wt 160.0 lb

## 2018-01-12 DIAGNOSIS — J4520 Mild intermittent asthma, uncomplicated: Secondary | ICD-10-CM

## 2018-01-12 DIAGNOSIS — R0602 Shortness of breath: Secondary | ICD-10-CM

## 2018-01-12 DIAGNOSIS — M25561 Pain in right knee: Secondary | ICD-10-CM

## 2018-01-12 DIAGNOSIS — M25562 Pain in left knee: Principal | ICD-10-CM

## 2018-01-12 DIAGNOSIS — J028 Acute pharyngitis due to other specified organisms: Secondary | ICD-10-CM

## 2018-01-12 DIAGNOSIS — R079 Chest pain, unspecified: Secondary | ICD-10-CM

## 2018-01-12 LAB — POCT RAPID STREP A (OFFICE): Rapid Strep A Screen: NEGATIVE

## 2018-01-12 LAB — POCT INFLUENZA A/B
Influenza A, POC: NEGATIVE
Influenza B, POC: NEGATIVE

## 2018-01-12 MED ORDER — IBUPROFEN 600 MG PO TABS: 600.0000 mg | ORAL_TABLET | Freq: Three times a day (TID) | ORAL | 0 refills | Status: DC | PRN

## 2018-01-12 MED ORDER — PREDNISONE 20 MG PO TABS: 60.0000 mg | ORAL_TABLET | Freq: Every day | ORAL | 0 refills | Status: AC

## 2018-01-12 MED ORDER — ALBUTEROL SULFATE (2.5 MG/3ML) 0.083% IN NEBU
2.5000 mg | INHALATION_SOLUTION | Freq: Once | RESPIRATORY_TRACT | Status: AC
  Administered 2018-01-12: 2.5 mg via RESPIRATORY_TRACT

## 2018-01-12 MED ORDER — AMOXICILLIN 500 MG PO CAPS: 500.0000 mg | ORAL_CAPSULE | Freq: Two times a day (BID) | ORAL | 0 refills | Status: DC

## 2018-01-12 MED ORDER — IPRATROPIUM BROMIDE 0.02 % IN SOLN
0.5000 mg | Freq: Once | RESPIRATORY_TRACT | Status: AC
  Administered 2018-01-12: 0.5 mg via RESPIRATORY_TRACT

## 2018-01-12 MED FILL — ?METOPROLOL TARTRATE 25 MG: 25 | 30 days supply | Qty: 90 | Fill #5

## 2018-01-12 MED FILL — PANTOPRAZOLE SOD DR 20 MG T: 20 | 30 days supply | Qty: 30 | Fill #6

## 2018-01-12 MED FILL — MELOXICAM 7.5 MG TABLET: 7.5 | 30 days supply | Qty: 30 | Fill #0

## 2018-01-12 MED FILL — FERROUS SULFATE 325 MG TAB: 325 (65 FE) | 30 days supply | Qty: 30 | Fill #3

## 2018-01-12 MED FILL — AMOX-CLAV 875-125 MG TABLET: 875-125 | 3 days supply | Qty: 6 | Fill #1

## 2018-01-12 MED FILL — predniSONE 20 MG TABS: 20 | 9 days supply | Qty: 9 | Fill #0

## 2018-01-12 MED FILL — AMOXICILLIN 500 MG CAPSULE: 500 | 15 days supply | Qty: 30 | Fill #0

## 2018-01-12 MED FILL — FLUTICASONE PROP 50 MCG SPR: 50 | 30 days supply | Qty: 16 | Fill #4

## 2018-01-12 MED FILL — IBUPROFEN 600 MG TABLET: 600 | 10 days supply | Qty: 30 | Fill #0

## 2018-01-12 MED FILL — !BREO ELLIPTA 100-25 MCG IN: 100-25 | 30 days supply | Qty: 60 | Fill #3

## 2018-01-12 NOTE — Progress Notes (Signed)
Patient [facility]   Progress Note: General Provider: Lanae Boast, FNP  SUBJECTIVE:   Betty Cain is a 42 y.o. female who  has a past medical history of Anemia, Asthma, Common migraine with intractable migraine (06/28/2017), GERD (gastroesophageal reflux disease), IUD (intrauterine device) in place (2011-09-25), Neonatal death, Palpitations, Shortness of breath, Spinal headache, and Valvular heart disease.. Patient presents today for Leg Pain; Hand Pain; Chest Pain (started this morning); and Back Pain  Patient states that she "feels bad all over my body" x 5 days. Patient with a hx of GERD, not taking medication. Hx of asthma, not taking medication. Hx of allergies, not taking medications.   Review of Systems  Constitutional: Negative.   HENT: Negative.   Eyes: Negative.   Respiratory: Positive for cough and sputum production.   Cardiovascular: Negative.   Gastrointestinal: Negative.   Genitourinary: Negative.   Musculoskeletal: Positive for back pain, joint pain and myalgias.  Skin: Negative.   Neurological: Negative.   Psychiatric/Behavioral: Negative.      OBJECTIVE: BP 133/72 (BP Location: Left Arm, Patient Position: Sitting, Cuff Size: Normal)   Pulse (!) 106   Temp 98.8 F (37.1 C) (Oral)   Resp 14   Ht 4\' 10"  (1.473 m)   Wt 160 lb (72.6 kg)   SpO2 99%   BMI 33.44 kg/m   Physical Exam  Constitutional: She is oriented to person, place, and time. She appears well-developed and well-nourished. No distress.  HENT:  Head: Normocephalic and atraumatic.  Mouth/Throat: Uvula is midline and oropharynx is clear and moist. No oropharyngeal exudate.  Eyes: Pupils are equal, round, and reactive to light. Conjunctivae and EOM are normal.  Neck: Normal range of motion.  Cardiovascular: Normal rate, regular rhythm, normal heart sounds and intact distal pulses.  Pulmonary/Chest: Effort normal. No respiratory distress. She has decreased  breath sounds (improved after neb treatment) in the right lower field and the left lower field.    Abdominal: Soft. Bowel sounds are normal. She exhibits no distension.  Musculoskeletal: Normal range of motion.       Right lower leg: Normal.       Left lower leg: Normal.  Neurological: She is alert and oriented to person, place, and time.  Skin: Skin is warm and dry.  Psychiatric: She has a normal mood and affect. Her behavior is normal. Thought content normal.  Nursing note and vitals reviewed.   ASSESSMENT/PLAN:  1. Chest pain, unspecified type - EKG 12-Lead- no acute changes. Respiratory v. Cardiac. Costochondritis.  - ibuprofen (ADVIL,MOTRIN) 600 MG tablet; Take 1 tablet (600 mg total) by mouth every 8 (eight) hours as needed.  Dispense: 30 tablet; Refill: 0  2. Shortness of breath - amoxicillin (AMOXIL) 500 MG capsule; Take 1 capsule (500 mg total) by mouth 2 (two) times daily.  Dispense: 30 capsule; Refill: 0 - predniSONE (DELTASONE) 20 MG tablet; Take 3 tablets (60 mg total) by mouth daily with breakfast for 3 days.  Dispense: 9 tablet; Refill: 0  3. Mild intermittent asthma without complication - albuterol (PROVENTIL) (2.5 MG/3ML) 0.083% nebulizer solution 2.5 mg - ipratropium (ATROVENT) nebulizer solution 0.5 mg - amoxicillin (AMOXIL) 500 MG capsule; Take 1 capsule (500 mg total) by mouth 2 (two) times daily.  Dispense: 30 capsule; Refill: 0 - predniSONE (DELTASONE) 20 MG tablet; Take 3 tablets (60 mg total) by mouth daily with breakfast for 3 days.  Dispense: 9 tablet; Refill: 0  4. Pharyngitis due to other organism -  Rapid Strep A- negative - Influenza A/B-negative Interpreter used during this visit.  Pt advised to take her medications as prescribed.           The patient was given clear instructions to go to ER or return to medical center if symptoms do not improve, worsen or new problems develop. The patient verbalized understanding and agreed with plan of care.    [name]. Nathaneil Canary, FNP-BC [facility] [address] [place],  [address] [phone]     This note has been created with Dragon speech recognition software and smart phrase technology. Any transcriptional errors are unintentional.

## 2018-01-12 NOTE — Patient Instructions (Signed)
Acute Bronchitis, Adult Acute bronchitis is when air tubes (bronchi) in the lungs suddenly get swollen. The condition can make it hard to breathe. It can also cause these symptoms:  A cough.  Coughing up clear, yellow, or green mucus.  Wheezing.  Chest congestion.  Shortness of breath.  A fever.  Body aches.  Chills.  A sore throat.  Follow these instructions at home: Medicines  Take over-the-counter and prescription medicines only as told by your doctor.  If you were prescribed an antibiotic medicine, take it as told by your doctor. Do not stop taking the antibiotic even if you start to feel better. General instructions  Rest.  Drink enough fluids to keep your pee (urine) clear or pale yellow.  Avoid smoking and secondhand smoke. If you smoke and you need help quitting, ask your doctor. Quitting will help your lungs heal faster.  Use an inhaler, cool mist vaporizer, or humidifier as told by your doctor.  Keep all follow-up visits as told by your doctor. This is important. How is this prevented? To lower your risk of getting this condition again:  Wash your hands often with soap and water. If you cannot use soap and water, use hand sanitizer.  Avoid contact with people who have cold symptoms.  Try not to touch your hands to your mouth, nose, or eyes.  Make sure to get the flu shot every year.  Contact a doctor if:  Your symptoms do not get better in 2 weeks. Get help right away if:  You cough up blood.  You have chest pain.  You have very bad shortness of breath.  You become dehydrated.  You faint (pass out) or keep feeling like you are going to pass out.  You keep throwing up (vomiting).  You have a very bad headache.  Your fever or chills gets worse. This information is not intended to replace advice given to you by your health care provider. Make sure you discuss any questions you have with your health care provider. Document Released:  09/15/2007 Document Revised: 11/05/2015 Document Reviewed: 09/17/2015 Elsevier Interactive Patient Education  2018 [employer]. Costochondritis Costochondritis is swelling and irritation (inflammation) of the tissue (cartilage) that connects your ribs to your breastbone (sternum). This causes pain in the front of your chest. Usually, the pain:  Starts gradually.  Is in more than one rib.  This condition usually goes away on its own over time. Follow these instructions at home:  Do not do anything that makes your pain worse.  If directed, put ice on the painful area: ? Put ice in a plastic bag. ? Place a towel between your skin and the bag. ? Leave the ice on for 20 minutes, 2-3 times a day.  If directed, put heat on the affected area as often as told by your doctor. Use the heat source that your doctor tells you to use, such as a moist heat pack or a heating pad. ? Place a towel between your skin and the heat source. ? Leave the heat on for 20-30 minutes. ? Take off the heat if your skin turns bright red. This is very important if you cannot feel pain, heat, or cold. You may have a greater risk of getting burned.  Take over-the-counter and prescription medicines only as told by your doctor.  Return to your normal activities as told by your doctor. Ask your doctor what activities are safe for you.  Keep all follow-up visits as told by your doctor.  This is important. Contact a doctor if:  You have chills or a fever.  Your pain does not go away or it gets worse.  You have a cough that does not go away. Get help right away if:  You are short of breath. This information is not intended to replace advice given to you by your health care provider. Make sure you discuss any questions you have with your health care provider. Document Released: 09/15/2007 Document Revised: 10/17/2015 Document Reviewed: 07/23/2015 Elsevier Interactive Patient Education  [employer].

## 2018-01-16 ENCOUNTER — Emergency Department (HOSPITAL_COMMUNITY): Payer: Self-pay

## 2018-01-16 ENCOUNTER — Emergency Department (HOSPITAL_COMMUNITY)
Admission: EM | Admit: 2018-01-16 | Discharge: 2018-01-16 | Disposition: A | Discharge status: Home / Self Care | Payer: Self-pay | Attending: Emergency Medicine | Admitting: Emergency Medicine

## 2018-01-16 ENCOUNTER — Encounter (HOSPITAL_COMMUNITY): Payer: Self-pay

## 2018-01-16 ENCOUNTER — Other Ambulatory Visit: Payer: Self-pay

## 2018-01-16 DIAGNOSIS — J45909 Unspecified asthma, uncomplicated: Secondary | ICD-10-CM | POA: Insufficient documentation

## 2018-01-16 DIAGNOSIS — Z79899 Other long term (current) drug therapy: Secondary | ICD-10-CM | POA: Insufficient documentation

## 2018-01-16 DIAGNOSIS — K219 Gastro-esophageal reflux disease without esophagitis: Secondary | ICD-10-CM | POA: Insufficient documentation

## 2018-01-16 LAB — BASIC METABOLIC PANEL
ANION GAP: 12 (ref 5–15)
BUN: 11 mg/dL (ref 6–20)
CHLORIDE: 101 mmol/L (ref 98–111)
CO2: 25 mmol/L (ref 22–32)
CREATININE: 0.72 mg/dL (ref 0.44–1.00)
Calcium: 9.3 mg/dL (ref 8.9–10.3)
GFR calc non Af Amer: >60 mL/min (ref >60)
Glucose, Bld: 190 mg/dL — ABNORMAL HIGH (ref 70–99)
Potassium: 2.9 mmol/L — ABNORMAL LOW (ref 3.5–5.1)
SODIUM: 138 mmol/L (ref 135–145)

## 2018-01-16 LAB — HEPATIC FUNCTION PANEL
ALBUMIN: 3.8 g/dL (ref 3.5–5.0)
ALT: 13 U/L (ref 0–44)
AST: 21 U/L (ref 15–41)
Alkaline Phosphatase: 66 U/L (ref 38–126)
BILIRUBIN DIRECT: 0.1 mg/dL (ref 0.0–0.2)
BILIRUBIN TOTAL: 0.7 mg/dL (ref 0.3–1.2)
Indirect Bilirubin: 0.6 mg/dL (ref 0.3–0.9)
Total Protein: 7.4 g/dL (ref 6.5–8.1)

## 2018-01-16 LAB — LIPASE, BLOOD: Lipase: 40 U/L (ref 11–51)

## 2018-01-16 LAB — CBC
HCT: 37.9 % (ref 36.0–46.0)
HEMOGLOBIN: 12.2 g/dL (ref 12.0–15.0)
MCH: 28.4 pg (ref 26.0–34.0)
MCHC: 32.2 g/dL (ref 30.0–36.0)
MCV: 88.1 fL (ref 78.0–100.0)
PLATELETS: 362 10*3/uL (ref 150–400)
RBC: 4.3 MIL/uL (ref 3.87–5.11)
RDW: 13.7 % (ref 11.5–15.5)
WBC: 10.3 10*3/uL (ref 4.0–10.5)

## 2018-01-16 LAB — I-STAT BETA HCG BLOOD, ED (MC, WL, AP ONLY)

## 2018-01-16 LAB — I-STAT TROPONIN, ED: TROPONIN I, POC: 0 ng/mL (ref 0.00–0.08)

## 2018-01-16 MED ORDER — POTASSIUM CHLORIDE 10 MEQ/100ML IV SOLN
10.0000 meq | Freq: Once | INTRAVENOUS | Status: AC
  Administered 2018-01-16: 10 meq via INTRAVENOUS
  Filled 2018-01-16: qty 100

## 2018-01-16 MED ORDER — GI COCKTAIL ~~LOC~~
30.0000 mL | Freq: Once | ORAL | Status: AC
  Administered 2018-01-16: 30 mL via ORAL
  Filled 2018-01-16: qty 30

## 2018-01-16 MED ORDER — POTASSIUM CHLORIDE CRYS ER 20 MEQ PO TBCR: 20.0000 meq | EXTENDED_RELEASE_TABLET | Freq: Every day | ORAL | 0 refills | Status: DC

## 2018-01-16 MED ORDER — FAMOTIDINE IN NACL 20-0.9 MG/50ML-% IV SOLN
20.0000 mg | Freq: Once | INTRAVENOUS | Status: AC
  Administered 2018-01-16: 20 mg via INTRAVENOUS
  Filled 2018-01-16: qty 50

## 2018-01-16 MED ORDER — ONDANSETRON HCL 4 MG/2ML IJ SOLN
4.0000 mg | Freq: Once | INTRAMUSCULAR | Status: AC
  Administered 2018-01-16: 4 mg via INTRAVENOUS
  Filled 2018-01-16: qty 2

## 2018-01-16 MED ORDER — TRAMADOL HCL 50 MG PO TABS: 50.0000 mg | ORAL_TABLET | Freq: Four times a day (QID) | ORAL | 0 refills | Status: DC | PRN

## 2018-01-16 MED ORDER — SODIUM CHLORIDE 0.9 % IV BOLUS
1000.0000 mL | Freq: Once | INTRAVENOUS | Status: AC
  Administered 2018-01-16: 1000 mL via INTRAVENOUS

## 2018-01-16 MED ORDER — HYDROMORPHONE HCL 1 MG/ML IJ SOLN
0.5000 mg | Freq: Once | INTRAMUSCULAR | Status: AC
  Administered 2018-01-16: 0.5 mg via INTRAVENOUS
  Filled 2018-01-16: qty 1

## 2018-01-16 NOTE — ED Notes (Signed)
Patient verbalizes understanding of discharge instructions. Opportunity for questioning and answers were provided. Ambulatory at discharge in NAD.  

## 2018-01-16 NOTE — ED Provider Notes (Signed)
[facility] Provider Note   CSN: [id number] Arrival date & time: 01/16/18  2585     History   Chief Complaint Chief Complaint  Patient presents with  . Chest Pain  . Generalized Body Aches    HPI Betty Cain is a 42 y.o. female.  Patient has a history of peptic ulcer problems.  She has been off her Protonix for almost 2 weeks now.  She complains of epigastric discomfort.  The history is provided by the patient. No language interpreter was used.  Abdominal Pain   Pertinent negatives include diarrhea, frequency, hematuria and headaches.    Past Medical History:  Diagnosis Date  . Anemia   . Asthma   . Common migraine with intractable migraine 06/28/2017  . GERD (gastroesophageal reflux disease)    pos H pylori  . IUD (intrauterine device) in place 09/03/2011   Due out in Sep 04, 2016  . Neonatal death    Vaginal delivery, full term-lived x2 hours.   . Palpitations   . Shortness of breath   . Spinal headache   . Valvular heart disease     Patient Active Problem List   Diagnosis Date Noted  . Common migraine with intractable migraine 06/28/2017  . Chest pain at rest 10/17/2016  . Sinus tachycardia 08/18/2016  . Prolonged Q-T interval on ECG 08/18/2016  . Vertigo 07/22/2015  . Abdominal discomfort 02/14/2015  . Low back pain 02/14/2015  . Generalized abdominal pain 02/01/2015  . Nausea 02/01/2015  . Environmental allergies 02/01/2015  . Language barrier to communication 02/01/2015  . Anemia 01/23/2015  . Prediabetes 01/23/2015  . Constipation 01/21/2015  . Knee pain, bilateral 01/21/2015  . Previous cesarean delivery affecting pregnancy 12/11/2013  . Palpitations 09/18/2013  . Shortness of breath 09/18/2013  . Previous cesarean delivery, delivered, with or without mention of antepartum condition 06/19/2013  . Advanced maternal age in pregnancy in second trimester 06/19/2013    Past Surgical History:  Procedure Laterality  Date  . ADENOIDECTOMY     as a child  . boil  2003   right elbow  . CESAREAN SECTION     x4  . CESAREAN SECTION  06/02/2011   Procedure: CESAREAN SECTION;  Surgeon: [name], MD;  Location: [facility];  Service: Gynecology;  Laterality: N/A;  Primary Cesarean Section Delivery Baby Boy @ 0004, Apgars 9/9  . CESAREAN SECTION N/A 12/10/2013   Procedure: REPEAT CESAREAN SECTION;  Surgeon: [name], MD;  Location: [facility];  Service: Obstetrics;  Laterality: N/A;     OB History    Gravida  5   Para  5   Term  5   Preterm      AB      Living  4     SAB      TAB      Ectopic      Multiple      Live Births  5            Home Medications    Prior to Admission medications   Medication Sig Start Date End Date Taking? Authorizing Provider  acetaminophen (TYLENOL) 500 MG tablet Take 1 tablet (500 mg total) by mouth every 6 (six) hours as needed. 01/21/15   Dorena Dew, FNP  amoxicillin (AMOXIL) 500 MG capsule Take 1 capsule (500 mg total) by mouth 2 (two) times daily. 01/12/18   Lanae Boast, [place]  Blood Pressure Monitoring [place] 1 each by Does not apply route  daily. 08/13/16   Dorena Dew, FNP  cetirizine (ZYRTEC) 10 MG tablet TAKE 1 TABLET BY MOUTH DAILY. 09/26/17   Dorena Dew, FNP  etonogestrel (NEXPLANON) 68 MG IMPL implant 1 each by Subdermal route once.    [provider]  FEROSUL 325 (65 Fe) MG tablet TAKE 1 TABLET (325 MG TOTAL) BY MOUTH DAILY. 09/26/17   Dorena Dew, FNP  fluticasone (FLONASE) 50 MCG/ACT nasal spray Place 2 sprays into both nostrils daily. 08/01/17   Dorena Dew, FNP  fluticasone furoate-vilanterol (BREO ELLIPTA) 100-25 MCG/INH AEPB Inhale 1 puff into the lungs daily.    [provider]  gabapentin (NEURONTIN) 100 MG capsule One capsule twice during the day and 3 at night 11/08/17   Kathrynn Ducking, MD  ibuprofen (ADVIL,MOTRIN) 600 MG tablet Take 1 tablet (600 mg total) by mouth every 8 (eight) hours  as needed. 01/12/18   Lanae Boast, FNP  ipratropium (ATROVENT) 0.06 % nasal spray Place 2 sprays into both nostrils as needed for rhinitis.    [provider]  levocetirizine (XYZAL) 5 MG tablet Take 1 tablet (5 mg total) by mouth every evening. 12/22/17   Lanae Boast, FNP  meloxicam (MOBIC) 7.5 MG tablet TAKE 1 TABLET BY MOUTH DAILY. 01/12/18   Lanae Boast, FNP  metoprolol tartrate (LOPRESSOR) 25 MG tablet Take 1.5 tablets (37.5 mg total) by mouth 2 (two) times daily. 06/29/17   Burtis Junes, NP  pantoprazole (PROTONIX) 40 MG tablet Take 1 tablet (40 mg total) by mouth 2 (two) times daily. Patient not taking: Reported on 01/12/2018 01/03/18   Yetta Flock, MD  sucralfate (CARAFATE) 1 GM/10ML suspension Take 10 mLs (1 g total) by mouth every 6 (six) hours as needed for up to 14 days. 01/04/18 01/18/18  Armbruster, Carlota Raspberry, MD  traMADol (ULTRAM) 50 MG tablet Take 1 tablet (50 mg total) by mouth every 6 (six) hours as needed. 01/16/18   Milton Ferguson, MD    Family History Family History  Problem Relation Age of Onset  . Diabetes Mother   . Diabetes Father   . Diabetes Sister   . Anesthesia problems Neg Hx   . Colon cancer Neg Hx   . Esophageal cancer Neg Hx   . Rectal cancer Neg Hx   . Stomach cancer Neg Hx     Social History Social History   Tobacco Use  . Smoking status: Never Smoker  . Smokeless tobacco: Never Used  Substance Use Topics  . Alcohol use: No  . Drug use: No     Allergies   Patient has no known allergies.   Review of Systems Review of Systems  Constitutional: Negative for appetite change and fatigue.  HENT: Negative for congestion, ear discharge and sinus pressure.   Eyes: Negative for discharge.  Respiratory: Negative for cough.   Cardiovascular: Negative for chest pain.  Gastrointestinal: Positive for abdominal pain. Negative for diarrhea.  Genitourinary: Negative for frequency and hematuria.  Musculoskeletal: Negative for back  pain.  Skin: Negative for rash.  Neurological: Negative for seizures and headaches.  Psychiatric/Behavioral: Negative for hallucinations.     Physical Exam Updated Vital Signs BP 111/73   Pulse 78   Temp 98.9 F (37.2 C) (Oral)   Resp 15   SpO2 100%   Physical Exam  Constitutional: She is oriented to person, place, and time. She appears well-developed.  HENT:  Head: Normocephalic.  Eyes: Conjunctivae and EOM are normal. No scleral icterus.  Neck:  Neck supple. No thyromegaly present.  Cardiovascular: Normal rate and regular rhythm. Exam reveals no gallop and no friction rub.  No murmur heard. Pulmonary/Chest: No stridor. She has no wheezes. She has no rales. She exhibits no tenderness.  Abdominal: She exhibits no distension. There is tenderness. There is no rebound.  Musculoskeletal: Normal range of motion. She exhibits no edema.  Lymphadenopathy:    She has no cervical adenopathy.  Neurological: She is oriented to person, place, and time. She exhibits normal muscle tone. Coordination normal.  Skin: No rash noted. No erythema.  Psychiatric: She has a normal mood and affect. Her behavior is normal.     ED Treatments / Results  Labs (all labs ordered are listed, but only abnormal results are displayed) Labs Reviewed  BASIC METABOLIC PANEL - Abnormal; Notable for the following components:      Result Value   Potassium 2.9 (*)    Glucose, Bld 190 (*)    All other components within normal limits  CBC  LIPASE, BLOOD  HEPATIC FUNCTION PANEL  I-STAT TROPONIN, ED  I-STAT BETA HCG BLOOD, ED ([facility], [facility], [facility] ONLY)    EKG EKG Interpretation  Date/Time:  Monday January 16 2018 09:54:22 EDT Ventricular Rate:  111 PR Interval:  114 QRS Duration: 86 QT Interval:  352 QTC Calculation: 478 R Axis:   52 Text Interpretation:  Sinus tachycardia T wave abnormality, consider inferior ischemia T wave abnormality, consider anterior ischemia Abnormal ECG Confirmed by Milton Ferguson  [phone]) on 01/16/2018 12:22:34 PM   Radiology Dg Chest 2 View  Result Date: 01/16/2018 CLINICAL DATA:  One week of productive cough and chills. Three days of shortness of breath. Also some chest pain and tightness. History of asthma. EXAM: CHEST - 2 VIEW COMPARISON:  PA and lateral chest x-ray of April 26, 2017 FINDINGS: The lungs are adequately inflated and clear. The heart and pulmonary vascularity are normal. The mediastinum is normal in width. The trachea is midline. The bony thorax exhibits no acute abnormality. IMPRESSION: There is no active cardiopulmonary disease. Electronically Signed   By: David  Martinique M.D.   On: 01/16/2018 10:31   Dg Abd Acute W/chest  Result Date: 01/16/2018 CLINICAL DATA:  Abdominal pain EXAM: DG ABDOMEN ACUTE W/ 1V CHEST COMPARISON:  Chest two-view 01/16/2018 FINDINGS: There is no evidence of dilated bowel loops or free intraperitoneal air. No radiopaque calculi or other significant radiographic abnormality is seen. Heart size and mediastinal contours are within normal limits. Both lungs are clear. IMPRESSION: Negative abdominal radiographs.  No acute cardiopulmonary disease. Electronically Signed   By: Franchot Gallo M.D.   On: 01/16/2018 13:26    Procedures Procedures (including critical care time)  Medications Ordered in ED Medications  gi cocktail (Maalox,Lidocaine,Donnatal) (has no administration in time range)  sodium chloride 0.9 % bolus 1,000 mL (0 mLs Intravenous Stopped 01/16/18 1402)  HYDROmorphone (DILAUDID) injection 0.5 mg (0.5 mg Intravenous Given 01/16/18 1253)  famotidine (PEPCID) IVPB 20 mg premix (0 mg Intravenous Stopped 01/16/18 1401)  ondansetron (ZOFRAN) injection 4 mg (4 mg Intravenous Given 01/16/18 1253)  potassium chloride 10 mEq in 100 mL IVPB (0 mEq Intravenous Stopped 01/16/18 1527)     Initial Impression / Assessment and Plan / ED Course  I have reviewed the triage vital signs and the nursing notes.  Pertinent labs & imaging  results that were available during my care of the patient were reviewed by me and considered in my medical decision making (see chart for details).  Patient with gastric discomfort.  I suspect this is related to her taking Mobic and Motrin..  Also she stopped her Protonix 2 weeks ago.  We will stop the Mobic stop the ibuprofen and start her back on her Protonix  Final Clinical Impressions(s) / ED Diagnoses   Final diagnoses:  Chronic GERD    ED Discharge Orders         Ordered    traMADol (ULTRAM) 50 MG tablet  Every 6 hours PRN     01/16/18 1539           Milton Ferguson, MD 01/16/18 1544

## 2018-01-16 NOTE — ED Triage Notes (Signed)
Pt endorses generalized bodyaches, cough and squeezing sensation to left chest x 3 days. VSS.

## 2018-01-16 NOTE — Discharge Instructions (Signed)
Stop taking the ibuprofen or Motrin.  Stop taking the Mobic or meloxicam.  And start back taking your Protonix 40 mg twice a day.  Follow back up with that gastroenterologist that you saw

## 2018-01-18 MED FILL — traMADol HCL 50 MG TABS: 50 | 3 days supply | Qty: 15 | Fill #0

## 2018-01-18 MED FILL — POTASSIUM CL ER 20 MEQ TABL: 20 | 3 days supply | Qty: 3 | Fill #0

## 2018-01-19 ENCOUNTER — Other Ambulatory Visit: Payer: No Typology Code available for payment source

## 2018-01-19 DIAGNOSIS — R1010 Upper abdominal pain, unspecified: Secondary | ICD-10-CM

## 2018-01-19 DIAGNOSIS — Z8619 Personal history of other infectious and parasitic diseases: Secondary | ICD-10-CM

## 2018-01-19 NOTE — Progress Notes (Signed)
For low potassium

## 2018-01-20 ENCOUNTER — Ambulatory Visit (INDEPENDENT_AMBULATORY_CARE_PROVIDER_SITE_OTHER): Payer: Self-pay | Admitting: Family Medicine

## 2018-01-20 ENCOUNTER — Telehealth: Payer: Self-pay | Admitting: Gastroenterology

## 2018-01-20 ENCOUNTER — Encounter: Payer: Self-pay | Admitting: Family Medicine

## 2018-01-20 VITALS — BP 122/71 | HR 112 | Temp 99.0°F | Resp 16 | Ht <= 58 in | Wt 158.0 lb

## 2018-01-20 DIAGNOSIS — K219 Gastro-esophageal reflux disease without esophagitis: Secondary | ICD-10-CM

## 2018-01-20 DIAGNOSIS — R5383 Other fatigue: Secondary | ICD-10-CM

## 2018-01-20 DIAGNOSIS — E876 Hypokalemia: Secondary | ICD-10-CM

## 2018-01-20 LAB — HELICOBACTER PYLORI  SPECIAL ANTIGEN
MICRO NUMBER: 91220537
SPECIMEN QUALITY: ADEQUATE

## 2018-01-20 MED ORDER — FAMOTIDINE 40 MG PO TABS: 40.0000 mg | ORAL_TABLET | Freq: Every day | ORAL | 2 refills | Status: DC

## 2018-01-20 MED ORDER — POTASSIUM CHLORIDE CRYS ER 20 MEQ PO TBCR: 20.0000 meq | EXTENDED_RELEASE_TABLET | Freq: Every day | ORAL | 1 refills | Status: DC

## 2018-01-20 MED FILL — FAMOTIDINE 40 MG TABS: 40 | 30 days supply | Qty: 30 | Fill #0

## 2018-01-20 MED FILL — POTASSIUM CL ER 20 MEQ TABL: 20 | 30 days supply | Qty: 30 | Fill #0

## 2018-01-20 NOTE — Telephone Encounter (Signed)
Let patient know the results are not ready yet. When they are available we will contact her.

## 2018-01-20 NOTE — Progress Notes (Signed)
Patient [facility]   Progress Note: General Provider: Lanae Boast, FNP  SUBJECTIVE:   Betty Cain is a 42 y.o. female who  has a past medical history of Anemia, Asthma, Common migraine with intractable migraine (06/28/2017), GERD (gastroesophageal reflux disease), IUD (intrauterine device) in place (2011-09-22), Neonatal death, Palpitations, Shortness of breath, Spinal headache, and Valvular heart disease.. Patient presents today for Follow-up (er follow up for low potassium ) Patient presents today for follow-up from the emergency room.  She was seen for abdominal pain and had low potassium levels.  Patient with previous history of GERD.  Had endoscopy on January 04, 2018 and was unable to complete upper endoscopy due to significant desaturation and hypoxia.  Patient was unable to be tested for H. pylori during the endoscopy.  H. pylori results pending from the ED.  Patient was instructed not to take NSAIDs and was started on tramadol by the ED provider.  Presents today for follow-up on potassium levels. Review of Systems  Constitutional: Positive for malaise/fatigue.  HENT: Negative.   Eyes: Negative.   Respiratory: Negative.   Cardiovascular: Negative.   Gastrointestinal: Positive for abdominal pain and heartburn.  Genitourinary: Negative.   Musculoskeletal: Negative.   Skin: Negative.   Neurological: Negative.   Psychiatric/Behavioral: Negative.      OBJECTIVE: BP 122/71 (BP Location: Right Arm, Patient Position: Sitting, Cuff Size: Normal)   Pulse (!) 112   Temp 99 F (37.2 C) (Oral)   Resp 16   Ht 4\' 10"  (1.473 m)   Wt 158 lb (71.7 kg)   SpO2 99%   BMI 33.02 kg/m   Physical Exam  Constitutional: She is oriented to person, place, and time. She appears well-developed and well-nourished. No distress.  HENT:  Head: Normocephalic and atraumatic.  Eyes: Pupils are equal, round, and reactive to light. Conjunctivae and EOM are normal.   Neck: Normal range of motion.  Cardiovascular: Regular rhythm, normal heart sounds and intact distal pulses. Tachycardia present.  Pulmonary/Chest: Effort normal and breath sounds normal. No respiratory distress.  Abdominal: Soft. Bowel sounds are normal. She exhibits no distension. There is tenderness (epigastric).  Musculoskeletal: Normal range of motion.  Neurological: She is alert and oriented to person, place, and time.  Skin: Skin is warm and dry.  Psychiatric: She has a normal mood and affect. Her behavior is normal. Judgment and thought content normal.  Nursing note and vitals reviewed.   ASSESSMENT/PLAN:   1. Low blood potassium - Potassium - potassium chloride SA (K-DUR,KLOR-CON) 20 MEQ tablet; Take 1 tablet (20 mEq total) by mouth daily.  Dispense: 30 tablet; Refill: 1  2. Gastroesophageal reflux disease, esophagitis presence not specified Patient to call gastro for appt. Referral is in place.  - Vitamin B12 - famotidine (PEPCID) 40 MG tablet; Take 1 tablet (40 mg total) by mouth daily.  Dispense: 30 tablet; Refill: 2  3. Fatigue, unspecified type Pending labs. Will adjust medications accordingly.   - Vitamin B12 - Vitamin D, 25-hydroxy - Iron, TIBC and Ferritin Panel   Reviewed medications and discussed which ones can cause fatigue. D/c 2 antihistamines. Increase fluids and encouraged proper rest.      The patient was given clear instructions to go to ER or return to medical center if symptoms do not improve, worsen or new problems develop. The patient verbalized understanding and agreed with plan of care.   [name]. Betty Canary, FNP-BC [facility] [place]  [place], [place] [address] [phone]     This note has been created with Dragon speech recognition software and smart phrase technology. Any transcriptional errors are unintentional.

## 2018-01-20 NOTE — Patient Instructions (Addendum)
It was a pleasure seeing you again.  I would like for you to make sure you are not taking double medications.  He will need to stop the cetirizine or Zyrtec. I am stopped I am taking your blood work today to check your potassium levels vitamin D levels and vitamin B12 levels.  I will contact you with the results next week.  I also started a new medication called famotidine to help with your GERD.  I encourage you to schedule a follow-up appointment with your cardiologist before the end of the year to make sure your medications are still working properly.   Food Choices for Gastroesophageal Reflux Disease, Adult When you have gastroesophageal reflux disease (GERD), the foods you eat and your eating habits are very important. Choosing the right foods can help ease your discomfort. What guidelines do I need to follow?  Choose fruits, vegetables, whole grains, and low-fat dairy products.  Choose low-fat meat, fish, and poultry.  Limit fats such as oils, salad dressings, butter, nuts, and avocado.  Keep a food diary. This helps you identify foods that cause symptoms.  Avoid foods that cause symptoms. These may be different for everyone.  Eat small meals often instead of 3 large meals a day.  Eat your meals slowly, in a place where you are relaxed.  Limit fried foods.  Cook foods using methods other than frying.  Avoid drinking alcohol.  Avoid drinking large amounts of liquids with your meals.  Avoid bending over or lying down until 2-3 hours after eating. What foods are not recommended? These are some foods and drinks that may make your symptoms worse: Vegetables Tomatoes. Tomato juice. Tomato and spaghetti sauce. Chili peppers. Onion and garlic. Horseradish. Fruits Oranges, grapefruit, and lemon (fruit and juice). Meats High-fat meats, fish, and poultry. This includes hot dogs, ribs, ham, sausage, salami, and bacon. Dairy Whole milk and chocolate milk. Sour cream. Cream. Butter.  Ice cream. Cream cheese. Drinks Coffee and tea. Bubbly (carbonated) drinks or energy drinks. Condiments Hot sauce. Barbecue sauce. Sweets/Desserts Chocolate and cocoa. Donuts. Peppermint and spearmint. Fats and Oils High-fat foods. This includes [place] fries and potato chips. Other Vinegar. Strong spices. This includes black pepper, white pepper, red pepper, cayenne, curry powder, cloves, ginger, and chili powder. The items listed above may not be a complete list of foods and drinks to avoid. Contact your dietitian for more information. This information is not intended to replace advice given to you by your health care provider. Make sure you discuss any questions you have with your health care provider. Document Released: 09/28/2011 Document Revised: 09/04/2015 Document Reviewed: 01/31/2013 Elsevier Interactive Patient Education  2017 [employer].

## 2018-01-21 LAB — VITAMIN D 25 HYDROXY (VIT D DEFICIENCY, FRACTURES): Vit D, 25-Hydroxy: 13 ng/mL — ABNORMAL LOW (ref 30.0–100.0)

## 2018-01-21 LAB — IRON,TIBC AND FERRITIN PANEL
Ferritin: 61 ng/mL (ref 15–150)
Iron Saturation: 17 % (ref 15–55)
Iron: 53 ug/dL (ref 27–159)
Total Iron Binding Capacity: 317 ug/dL (ref 250–450)
UIBC: 264 ug/dL (ref 131–425)

## 2018-01-21 LAB — VITAMIN B12: Vitamin B-12: 332 pg/mL (ref 232–1245)

## 2018-01-21 LAB — POTASSIUM: Potassium: 4.5 mmol/L (ref 3.5–5.2)

## 2018-01-31 ENCOUNTER — Telehealth: Payer: Self-pay

## 2018-01-31 NOTE — Telephone Encounter (Signed)
Called to verify appointment for 02/01/2018. NO answer left a message of appointment time and if unable to get to call back to reschedule. Thanks!

## 2018-02-01 ENCOUNTER — Other Ambulatory Visit: Payer: Self-pay

## 2018-02-01 ENCOUNTER — Ambulatory Visit (INDEPENDENT_AMBULATORY_CARE_PROVIDER_SITE_OTHER): Payer: Self-pay | Admitting: Family Medicine

## 2018-02-01 VITALS — BP 112/66 | HR 82 | Temp 98.7°F | Resp 16 | Ht <= 58 in | Wt 162.0 lb

## 2018-02-01 DIAGNOSIS — F419 Anxiety disorder, unspecified: Secondary | ICD-10-CM

## 2018-02-01 DIAGNOSIS — R000 Tachycardia, unspecified: Secondary | ICD-10-CM

## 2018-02-01 MED ORDER — GLUCOSE BLOOD VI STRP: ORAL_STRIP | 12 refills | Status: DC

## 2018-02-01 MED ORDER — HYDROXYZINE HCL 10 MG PO TABS: 10.0000 mg | ORAL_TABLET | Freq: Three times a day (TID) | ORAL | 0 refills | Status: DC | PRN

## 2018-02-01 MED ORDER — TRUEPLUS LANCETS 26G MISC: 1.0000 | 12 refills | Status: DC

## 2018-02-01 MED ORDER — BLOOD PRESSURE MONITORING DEVI: 1.0000 | Freq: Every day | 0 refills | Status: AC

## 2018-02-01 MED ORDER — GABAPENTIN 300 MG PO CAPS: 300.0000 mg | ORAL_CAPSULE | Freq: Every day | ORAL | 3 refills | Status: DC

## 2018-02-01 MED ORDER — DULOXETINE HCL 20 MG PO CPEP: 20.0000 mg | ORAL_CAPSULE | Freq: Every day | ORAL | 0 refills | Status: DC

## 2018-02-01 MED FILL — hydrOXYzine HCL 10 MG TABS: 10 | 10 days supply | Qty: 30 | Fill #0

## 2018-02-01 MED FILL — TRUEplus LANCETS 28G MISC: 30 days supply | Qty: 100 | Fill #0

## 2018-02-01 MED FILL — GABAPENTIN 300 MG CAPSULE: 300 | 30 days supply | Qty: 30 | Fill #0

## 2018-02-01 MED FILL — !TRUE METRIX BLOOD GLUCOSE: 1 days supply | Qty: 1 | Fill #0

## 2018-02-01 MED FILL — CARAFATE 1 GM/10 ML SUSP: 1 | 14 days supply | Qty: 420 | Fill #1

## 2018-02-01 MED FILL — ?DULoxetine HCL 20 MG CPEP: 20 | 30 days supply | Qty: 30 | Fill #0

## 2018-02-01 MED FILL — METOPROLOL TARTRATE 25 MG T: 25 | 30 days supply | Qty: 90 | Fill #6

## 2018-02-01 MED FILL — TRUE METRIX TEST STRIP: 30 days supply | Qty: 100 | Fill #0

## 2018-02-01 NOTE — Progress Notes (Signed)
[facility]   Progress Note: General Provider: Lanae Boast, FNP  SUBJECTIVE:   Betty Cain is a 42 y.o. female who  has a past medical history of Anemia, Asthma, Common migraine with intractable migraine (06/28/2017), GERD (gastroesophageal reflux disease), IUD (intrauterine device) in place (10-02-2011), Neonatal death, Palpitations, Shortness of breath, Spinal headache, and Valvular heart disease.. Patient presents today for Foot Pain (both feet/ legs/ and knees ) and Abdominal Pain Patient has been previously seen by gastroenterologist, neurologist, and cardiologist.  Patient with complaints of pain to the bilateral lower extremities, intermittent chest discomfort, GERD, fatigue, and abdominal pain.  Has been seen multiple times for these issues. Patient is accompanied by an interpreter.  Review of Systems  Constitutional: Negative.   HENT: Negative.   Eyes: Negative.   Respiratory: Negative.   Cardiovascular: Negative.   Gastrointestinal: Positive for abdominal pain and heartburn.  Genitourinary: Negative.   Musculoskeletal: Positive for myalgias.  Skin: Negative.   Neurological: Positive for dizziness and headaches.  Psychiatric/Behavioral: Negative.      OBJECTIVE: BP 112/66 (BP Location: Left Arm, Patient Position: Sitting, Cuff Size: Normal)   Pulse 82   Temp 98.7 F (37.1 C) (Oral)   Resp 16   Ht 4\' 10"  (1.473 m)   Wt 162 lb (73.5 kg)   SpO2 100%   BMI 33.86 kg/m   Physical Exam  Constitutional: She is oriented to person, place, and time. She appears well-developed and well-nourished. No distress.  HENT:  Head: Normocephalic and atraumatic.  Mouth/Throat: Oropharynx is clear and moist.  Eyes: Pupils are equal, round, and reactive to light. Conjunctivae and EOM are normal.  Neck: Normal range of motion.  Cardiovascular: Normal rate, regular rhythm, normal heart sounds and intact distal pulses.  Pulmonary/Chest:  Effort normal and breath sounds normal. No respiratory distress.  Abdominal: Soft. Bowel sounds are normal. She exhibits no distension. There is tenderness in the epigastric area.  Musculoskeletal: Normal range of motion.  Neurological: She is alert and oriented to person, place, and time.  Skin: Skin is warm and dry.  Psychiatric: She has a normal mood and affect. Her behavior is normal. Thought content normal.  Nursing note and vitals reviewed.   ASSESSMENT/PLAN: Patient received phone call regarding sick child during the examination.  After this she states that her stomach pain became more severe.  Advised patient that her symptoms are most likely related to anxiety and stress.  She states that she has been stressed having 4 children that she takes care of regularly.  Will start on Cymbalta and decrease her gabapentin to 300 mg by mouth at bedtime only. 1. Anxiety I am starting you on a new medication in the SSRI/SNRI family for your depression/anxiety. Take this medication daily as it has been prescribed. You may experience gastrointestinal upset. This usually will stop after you are on the medication for a few days. If you have feelings of euphoria (happy and high), stop the medication immediately and go to the nearest emergency department. If you have suicidal or homicidal thoughts, delusions or hallucinations, stop the medication immediately and go to the nearest emergency department.  - DULoxetine (CYMBALTA) 20 MG capsule; Take 1 capsule (20 mg total) by mouth daily after breakfast.  Dispense: 30 capsule; Refill: 0 - hydrOXYzine (ATARAX/VISTARIL) 10 MG tablet; Take 1 tablet (10 mg total) by mouth 3 (three) times daily as needed.  Dispense: 30 tablet; Refill: 0 - gabapentin (NEURONTIN) 300 MG capsule; Take  1 capsule (300 mg total) by mouth at bedtime.  Dispense: 30 capsule; Refill: 3  2. Sinus tachycardia We discussed which medications may be causing or contributing to her fatigue.  Patient  is to continue current medications at the present time.  No medication changes. - Blood Pressure Monitoring DEVI; 1 each by Does not apply route daily.  Dispense: 1 Device; Refill: 0         The patient was given clear instructions to go to ER or return to medical center if symptoms do not improve, worsen or new problems develop. The patient verbalized understanding and agreed with plan of care.   [name]. Nathaneil Canary, FNP-BC [facility] [address] [place],  [address] [phone]     This note has been created with Dragon speech recognition software and smart phrase technology. Any transcriptional errors are unintentional.

## 2018-02-01 NOTE — Patient Instructions (Addendum)
I changed the gabapentin to one time at night. I started 2 new medications to help with anxiety.  I am starting you on a new medication in the SSRI/SNRI family for your depression/anxiety. Take this medication daily as it has been prescribed. You may experience gastrointestinal upset. This usually will stop after you are on the medication for a few days. If you have feelings of euphoria (happy and high), stop the medication immediately and go to the nearest emergency department. If you have suicidal or homicidal thoughts, delusions or hallucinations, stop the medication immediately and go to the nearest emergency department. I would like to see you again in 3 weeks.   Somatic Symptom Disorder Somatic symptom disorder is a mental disorder. People with this disorder have physical (somatic) symptoms that cause distress or affect daily function. However, no other medical condition can be found to explain these symptoms. In addition, people with this disorder react to the somatic symptoms in a way that is out of proportion to the symptoms. The reaction may include:  Thinking all the time about the severity of the symptoms.  Feeling very anxious all the time about the symptoms or general health.  Spending a lot of time and energy dealing with the symptoms or health concerns.  Somatic symptom disorder may interfere with relationships, work, school, or other daily activities. It may lead to frequent medical visits and many medical tests to determine the cause of symptoms. It may also lead to surgical procedures that do not help and can cause serious problems. People who have pain as the main or only symptom may become addicted to pain medicines. People with somatic symptom disorder are also at risk for alcohol or drug addiction, suicide attempts, and divorce. Somatic symptom disorder may start in childhood but is most common in young adults. The disorder may be triggered by stressful life events. It may last  for several years or may come and go throughout life. What increases the risk? Risk factors include:  Being female. The disorder is more common in females than males.  Having a history of childhood abuse.  Having family members with the disorder.  What are the signs or symptoms? Signs and symptoms of somatic symptom disorder may include:  Pain. Pain may involve any body part or organ. It may be the only somatic symptom present.  Stomach or intestinal symptoms. Examples include nausea, vomiting, bloating, diarrhea, or food intolerance.  Problems with sexual or reproductive function. This may include irregular or heavy periods in women and erectile problems in men.  A person with this disorder may also have symptoms that suggest disorders of the brain or nervous system. Examples include:  Loss of balance.  Muscle weakness.  Difficulty urinating.  Difficulty swallowing or the feeling of having a lump in the throat.  Difficulty speaking.  Loss of the sensation of touch or pain.  Blindness or double vision.  Deafness.  Seizures.  How is this diagnosed? Somatic symptom disorder is diagnosed through an evaluation by your health care provider. Exams and tests will be done to rule out serious physical health problems. The evaluation will vary depending on your specific symptoms. It may include:  A physical exam.  Lab tests on blood or urine samples.  X-rays or other imaging studies.  Other procedures.  Your health care provider may refer you to a mental health specialist for psychological evaluation. Somatic symptoms can be related to a number of mental disorders. How is this treated? The most effective  treatment for somatic symptom disorder is a combination of the following options:  Regular follow-up visits with your health care provider for evaluation and reassurance.  Counseling or talk therapy.Talk therapy is provided bymental health specialists. The goals are to  help you understand what triggers your symptoms and to help you learn coping skills.  Medicine. Certain medicines can help with severe anxiety or depression related to somatic symptom disorder.  Healthy lifestyle.Balanced diet and regular exercise can reduce stress and somatic symptoms.  Follow these instructions at home:  Take medicines only as directed by your health care provider.  Get regular exercise. Check with your health care provider before starting an exercise program.  Keep all follow-up visits as directed by your health care provider. This is important. Contact a health care provider if:  Your pain or symptoms do not go away or they become severe.  You develop new symptoms. Get help right away if: You have serious thoughts about hurting yourself or someone else. This information is not intended to replace advice given to you by your health care provider. Make sure you discuss any questions you have with your health care provider. Document Released: 05/01/2010 Document Revised: 09/04/2015 Document Reviewed: 08/09/2013 Elsevier Interactive Patient Education  2018 [employer].

## 2018-02-02 MED ORDER — VITAMIN D (ERGOCALCIFEROL) 1.25 MG (50000 UNIT) PO CAPS: 50000.0000 [IU] | ORAL_CAPSULE | ORAL | 1 refills | Status: DC

## 2018-02-02 MED FILL — ?CETIRIZINE HCL 10 MG TABLE: 10 | 30 days supply | Qty: 30 | Fill #2

## 2018-02-03 MED FILL — VIT D2 1.25 MG (50,000 UNIT: 1.25 MG | 28 days supply | Qty: 4 | Fill #0

## 2018-02-06 ENCOUNTER — Telehealth: Payer: Self-pay

## 2018-02-06 NOTE — Telephone Encounter (Signed)
Called and spoke with patient using language resources [phone]. Patient was asking about vitamin D and how to take rx for vitamin D. I explained that she take this once a week as directed and we will follow up at next visit. Thanks!

## 2018-02-13 ENCOUNTER — Other Ambulatory Visit: Payer: Self-pay | Admitting: *Deleted

## 2018-02-13 MED ORDER — FLUTICASONE FUROATE-VILANTEROL 100-25 MCG/INH IN AEPB: 1.0000 | INHALATION_SPRAY | Freq: Every day | RESPIRATORY_TRACT | 3 refills | Status: DC

## 2018-02-13 NOTE — Telephone Encounter (Signed)
PRINTED FOR PASS PROGRAM 

## 2018-02-23 ENCOUNTER — Other Ambulatory Visit: Payer: Self-pay | Admitting: Family Medicine

## 2018-02-23 DIAGNOSIS — D649 Anemia, unspecified: Secondary | ICD-10-CM

## 2018-02-23 MED FILL — POTASSIUM CL ER 20 MEQ TAB: 20 | 30 days supply | Qty: 30 | Fill #1

## 2018-02-23 MED FILL — FERROUS SULFATE 325 MG TAB: 325 (65 FE) | 30 days supply | Qty: 30 | Fill #0

## 2018-02-27 ENCOUNTER — Ambulatory Visit (INDEPENDENT_AMBULATORY_CARE_PROVIDER_SITE_OTHER): Payer: No Typology Code available for payment source | Admitting: Gastroenterology

## 2018-02-27 ENCOUNTER — Other Ambulatory Visit (INDEPENDENT_AMBULATORY_CARE_PROVIDER_SITE_OTHER): Payer: No Typology Code available for payment source

## 2018-02-27 ENCOUNTER — Encounter: Payer: Self-pay | Admitting: Gastroenterology

## 2018-02-27 VITALS — BP 104/68 | HR 62 | Ht <= 58 in | Wt 161.2 lb

## 2018-02-27 DIAGNOSIS — R1033 Periumbilical pain: Secondary | ICD-10-CM

## 2018-02-27 DIAGNOSIS — K219 Gastro-esophageal reflux disease without esophagitis: Secondary | ICD-10-CM

## 2018-02-27 LAB — CREATININE, SERUM: Creatinine, Ser: 0.63 mg/dL (ref 0.40–1.20)

## 2018-02-27 LAB — BUN: BUN: 10 mg/dL (ref 6–23)

## 2018-02-27 MED ORDER — PANTOPRAZOLE SODIUM 40 MG PO TBEC: 40.0000 mg | DELAYED_RELEASE_TABLET | Freq: Two times a day (BID) | ORAL | 1 refills | Status: DC

## 2018-02-27 MED ORDER — SUCRALFATE 1 GM/10ML PO SUSP: 1.0000 g | Freq: Four times a day (QID) | ORAL | 1 refills | Status: DC | PRN

## 2018-02-27 MED FILL — ?PANTOPRAZOLE SOD DR 40MG T: 40 | 30 days supply | Qty: 60 | Fill #0

## 2018-02-27 MED FILL — CARAFATE 1 GM/10 ML SUSP: 1 | 16 days supply | Qty: 420 | Fill #0

## 2018-02-27 NOTE — Progress Notes (Signed)
HPI :  42 y/o Arabic speaking female here for a follow up visit. History provided through translator. She has a history of H. Pylori as well as GERD. Previously had an H. Pylori IgG serology in 2022/08/07 and was treated for that. He had recurrence of symptoms after treatment, was placed on Protonix and scheduled for an endoscopy with me in September. She had an EGD on September 25. She had some mild gastritis noted, unfortunately her procedure was aborted due to significant laryngospasm and oxygen desaturation. Esophagus was not well evaluated but a limited exam did not appear to have any significant abnormalities. Unfortunately given the procedure was aborted we cannot take biopsies to confirm H. Pylori eradication. We stopped her Protonix and she submitted a stool study which was negative for H. Pylori. She has since been taking Pepcid every day as well as Carafate as needed. She reports the Carafate differently helps with her reflux symptoms and chest discomfort. She endorses intermittent chest discomfort with burning in her chest as well as occasional regurgitation. Often can bother her at night. She has had a cardiac workup for her chest discomfort with 2 stress tests in the past 2 years which have both been unremarkable. The last test test was performed in June of this year. She states that her symptoms did appear better controlled when she was on Protonix..  She otherwise has been having ongoing periumbilical abdominal pain. She reports she has pain there all the time which does not go away. She is not sure if eating can make this worse at times. She denies any changes in her bowel habits or blood in her stools. No diarrhea or constipation. She's had an ultrasound remotely of her right upper quadrant which did not show any gallstones.   EGD 01/04/18 - significant desaturation due to laryngospasm and procedure was aborted, mild gastriits, esophagus not well evaluated    Past Medical History:    Diagnosis Date  . Anemia   . Asthma   . Common migraine with intractable migraine 06/28/2017  . GERD (gastroesophageal reflux disease)    pos H pylori  . IUD (intrauterine device) in place 09/03/2011   Due out in 08-06-2016  . Neonatal death    Vaginal delivery, full term-lived x2 hours.   . Palpitations   . Shortness of breath   . Spinal headache   . Valvular heart disease      Past Surgical History:  Procedure Laterality Date  . ADENOIDECTOMY     as a child  . boil  2003   right elbow  . CESAREAN SECTION     x4  . CESAREAN SECTION  06/02/2011   Procedure: CESAREAN SECTION;  Surgeon: [name], MD;  Location: [facility];  Service: Gynecology;  Laterality: N/A;  Primary Cesarean Section Delivery Baby Boy @ 0004, Apgars 9/9  . CESAREAN SECTION N/A 12/10/2013   Procedure: REPEAT CESAREAN SECTION;  Surgeon: [name], MD;  Location: [facility];  Service: Obstetrics;  Laterality: N/A;   Family History  Problem Relation Age of Onset  . Diabetes Mother   . Diabetes Father   . Diabetes Sister   . Anesthesia problems Neg Hx   . Colon cancer Neg Hx   . Esophageal cancer Neg Hx   . Rectal cancer Neg Hx   . Stomach cancer Neg Hx    Social History   Tobacco Use  . Smoking status: Never Smoker  . Smokeless tobacco: Never Used  Substance Use Topics  .  Alcohol use: No  . Drug use: No   Current Outpatient Medications  Medication Sig Dispense Refill  . acetaminophen (TYLENOL) 500 MG tablet Take 1 tablet (500 mg total) by mouth every 6 (six) hours as needed. 30 tablet 0  . Blood Pressure Monitoring DEVI 1 each by Does not apply route daily. 1 Device 0  . DULoxetine (CYMBALTA) 20 MG capsule Take 1 capsule (20 mg total) by mouth daily after breakfast. 30 capsule 0  . etonogestrel (NEXPLANON) 68 MG IMPL implant 1 each by Subdermal route once.    . famotidine (PEPCID) 40 MG tablet Take 1 tablet (40 mg total) by mouth daily. 30 tablet 2  . FEROSUL 325 (65 Fe) MG tablet TAKE 1 TABLET  (325 MG TOTAL) BY MOUTH DAILY. 30 tablet 3  . fluticasone (FLONASE) 50 MCG/ACT nasal spray Place 2 sprays into both nostrils daily. 16 g 6  . fluticasone furoate-vilanterol (BREO ELLIPTA) 100-25 MCG/INH AEPB Inhale 1 puff into the lungs daily. 84 each 3  . gabapentin (NEURONTIN) 300 MG capsule Take 1 capsule (300 mg total) by mouth at bedtime. 30 capsule 3  . glucose blood (TRUE METRIX BLOOD GLUCOSE TEST) test strip Use as instructed 100 each 12  . hydrOXYzine (ATARAX/VISTARIL) 10 MG tablet Take 1 tablet (10 mg total) by mouth 3 (three) times daily as needed. 30 tablet 0  . ipratropium (ATROVENT) 0.06 % nasal spray Place 2 sprays into both nostrils as needed for rhinitis.    Marland Kitchen levocetirizine (XYZAL) 5 MG tablet Take 1 tablet (5 mg total) by mouth every evening. 30 tablet 2  . metoprolol tartrate (LOPRESSOR) 25 MG tablet Take 1.5 tablets (37.5 mg total) by mouth 2 (two) times daily. 90 tablet 6  . potassium chloride SA (K-DUR,KLOR-CON) 20 MEQ tablet Take 1 tablet (20 mEq total) by mouth daily. 30 tablet 1  . traMADol (ULTRAM) 50 MG tablet Take 1 tablet (50 mg total) by mouth every 6 (six) hours as needed. 15 tablet 0  . TRUEPLUS LANCETS 26G MISC 1 each by Does not apply route as directed. 100 each 12  . Vitamin D, Ergocalciferol, (DRISDOL) 50000 units CAPS capsule Take 1 capsule (50,000 Units total) by mouth every 7 (seven) days. 8 capsule 1  . sucralfate (CARAFATE) 1 GM/10ML suspension Take 10 mLs (1 g total) by mouth every 6 (six) hours as needed for up to 14 days. 420 mL 1   No current facility-administered medications for this visit.    No Known Allergies   Review of Systems: All systems reviewed and negative except where noted in HPI.   Lab Results  Component Value Date   WBC 10.3 01/16/2018   HGB 12.2 01/16/2018   HCT 37.9 01/16/2018   MCV 88.1 01/16/2018   PLT 362 01/16/2018    Lab Results  Component Value Date   CREATININE 0.63 02/27/2018   BUN 10 02/27/2018   NA 138  01/16/2018   K 4.5 01/20/2018   CL 101 01/16/2018   CO2 25 01/16/2018    Lab Results  Component Value Date   ALT 13 01/16/2018   AST 21 01/16/2018   ALKPHOS 66 01/16/2018   BILITOT 0.7 01/16/2018     Physical Exam: BP 104/68   Pulse 62   Ht 4\' 10"  (1.473 m)   Wt 161 lb 4 oz (73.1 kg)   BMI 33.70 kg/m  Constitutional: Pleasant,well-developed, female in no acute distress. HEENT: Normocephalic and atraumatic. Conjunctivae are normal. No scleral icterus. Neck supple.  Cardiovascular: Normal rate, regular rhythm.  Pulmonary/chest: Effort normal and breath sounds normal. No wheezing, rales or rhonchi. Abdominal: Soft, nondistended, nontender.  There are no masses palpable. No hepatomegaly. Extremities: no edema Lymphadenopathy: No cervical adenopathy noted. Neurological: Alert and oriented to person place and time. Skin: Skin is warm and dry. No rashes noted. Psychiatric: Normal mood and affect. Behavior is normal.   ASSESSMENT AND PLAN: 42 year old female here for reassessment the following issues:  GERD - history obtained by translator, however I do think most of her chest symptoms are related to reflux. She's had a negative cardiac evaluation recently. Unfortunately her EGD was somewhat limited, she tested negative for H. Pylori and has been eradicated. She did better on high-dose PPI, we'll place her back on Protonix 40 mg twice daily and she can continue the Carafate as needed. She will stop the Pepcid for now. If no benefit on this regimen she will contact me. Long-term wish to use the lowest dose of PPI needed to control symptoms.  Abdominal pain - periumbilical abdominal pain which appears to be persistent now for several weeks. History is difficult to obtain through the translator regarding this but I don't appreciate any clear triggers that she endorses. I'm not sure what to make of this given her exam is otherwise benign, her labs are normal. I offered her CT scan of the  abdomen and pelvis with contrast to further evaluate, she strongly wanted to proceed with this. Further recommendations pending the result.  Schlater Cellar, MD [facility]

## 2018-02-27 NOTE — Patient Instructions (Addendum)
If you are age 40 or older, your body mass index should be between 23-30. Your Body mass index is 33.7 kg/m. If this is out of the aforementioned range listed, please consider follow up with your Primary Care Provider.  If you are age 50 or younger, your body mass index should be between 19-25. Your Body mass index is 33.7 kg/m. If this is out of the aformentioned range listed, please consider follow up with your Primary Care Provider.    Please go to the lab in the basement of our building to have lab work done as you leave today. Hit "B" for basement when you get on the elevator.  When the doors open the lab is on your left.  We will call you with the results. Thank you.   You have been scheduled for a CT scan of the abdomen and pelvis at [facility] ([address]---this is in the same building as Press photographer).   You are scheduled on Wednesday, 03-15-18 at 3:00pm. You should arrive 15 minutes prior to your appointment time for registration. Please follow the written instructions below on the day of your exam:  WARNING: IF YOU ARE ALLERGIC TO IODINE/X-RAY DYE, PLEASE NOTIFY RADIOLOGY IMMEDIATELY AT [phone]! YOU WILL BE GIVEN A 13 HOUR PREMEDICATION PREP.  1) Do not eat anything after 11:00am (4 hours prior to your test) 2) You have been given 2 bottles of oral contrast to drink. The solution may taste better if refrigerated, but do NOT add ice or any other liquid to this solution. Shake well before drinking.    Drink 1 bottle of contrast @ 1:00pm (2 hours prior to your exam)  Drink 1 bottle of contrast @ 2:00pm (1 hour prior to your exam)  You may take any medications as prescribed with a small amount of water, if necessary. If you take any of the following medications: METFORMIN, GLUCOPHAGE, GLUCOVANCE, AVANDAMET, RIOMET, FORTAMET, [place] MET, JANUMET, GLUMETZA or METAGLIP, you MAY be asked to HOLD this medication 48 hours AFTER the exam.  The purpose of you  drinking the oral contrast is to aid in the visualization of your intestinal tract. The contrast solution may cause some diarrhea. Depending on your individual set of symptoms, you may also receive an intravenous injection of x-ray contrast/dye. Plan on being at [facility] for 30 minutes or longer, depending on the type of exam you are having performed.  This test typically takes 30-45 minutes to complete.  If you have any questions regarding your exam or if you need to reschedule, you may call the CT department at [phone] between the hours of 8:00 am and 5:00 pm, Monday-Friday.  __________________________________________________________________   We have sent the following medications to your pharmacy for you to pick up at your convenience: Protonix 35m: Take twice a day  Carafate: Take every 6 hours as needed  Discontinue Pepcid.  Thank you for entrusting me with your care and for choosing [facility]. SCarolina Cellar

## 2018-02-28 ENCOUNTER — Other Ambulatory Visit: Payer: Self-pay | Admitting: Family Medicine

## 2018-02-28 ENCOUNTER — Other Ambulatory Visit: Payer: Self-pay | Admitting: Nurse Practitioner

## 2018-02-28 DIAGNOSIS — R002 Palpitations: Secondary | ICD-10-CM

## 2018-02-28 DIAGNOSIS — F419 Anxiety disorder, unspecified: Secondary | ICD-10-CM

## 2018-02-28 DIAGNOSIS — I471 Supraventricular tachycardia: Secondary | ICD-10-CM

## 2018-02-28 MED FILL — VIT D2 1.25 MG (50,000 UNIT: 1.25 MG | 28 days supply | Qty: 4 | Fill #1

## 2018-02-28 MED FILL — TRUEplus LANCETS 28G MISC: 30 days supply | Qty: 100 | Fill #1

## 2018-02-28 MED FILL — FLUTICASONE PROP 50 MCG SPR: 50 | 30 days supply | Qty: 16 | Fill #5

## 2018-02-28 MED FILL — hydrOXYzine HCL 10 MG TABS: 10 | 10 days supply | Qty: 30 | Fill #0

## 2018-02-28 MED FILL — TRUE METRIX TEST STRIP: 30 days supply | Qty: 100 | Fill #1

## 2018-02-28 MED FILL — GABAPENTIN 300 MG CAPSULE: 300 | 30 days supply | Qty: 30 | Fill #1

## 2018-02-28 MED FILL — ?METOPROLOL TARTRATE 25 MG: 25 | 30 days supply | Qty: 90 | Fill #0

## 2018-02-28 MED FILL — !BREO ELLIPTA 100-25 MCG IN: 100-25 | 30 days supply | Qty: 60 | Fill #4

## 2018-03-02 ENCOUNTER — Encounter: Payer: Self-pay | Admitting: Family Medicine

## 2018-03-02 ENCOUNTER — Telehealth: Payer: Self-pay | Admitting: Family Medicine

## 2018-03-02 ENCOUNTER — Ambulatory Visit (INDEPENDENT_AMBULATORY_CARE_PROVIDER_SITE_OTHER): Payer: Self-pay | Admitting: Family Medicine

## 2018-03-02 VITALS — BP 119/64 | HR 98 | Temp 98.7°F | Resp 16 | Ht <= 58 in | Wt 162.0 lb

## 2018-03-02 DIAGNOSIS — Z9109 Other allergy status, other than to drugs and biological substances: Secondary | ICD-10-CM

## 2018-03-02 DIAGNOSIS — M940 Chondrocostal junction syndrome [Tietze]: Secondary | ICD-10-CM

## 2018-03-02 DIAGNOSIS — E559 Vitamin D deficiency, unspecified: Secondary | ICD-10-CM

## 2018-03-02 DIAGNOSIS — F515 Nightmare disorder: Secondary | ICD-10-CM

## 2018-03-02 DIAGNOSIS — F419 Anxiety disorder, unspecified: Secondary | ICD-10-CM

## 2018-03-02 DIAGNOSIS — Z789 Other specified health status: Secondary | ICD-10-CM

## 2018-03-02 MED ORDER — CHOLECALCIFEROL 20 MCG (800 UNIT) PO TABS: 1.0000 | ORAL_TABLET | Freq: Every day | ORAL | 5 refills | Status: AC

## 2018-03-02 MED ORDER — PREDNISONE 20 MG PO TABS: 60.0000 mg | ORAL_TABLET | Freq: Every day | ORAL | 0 refills | Status: AC

## 2018-03-02 MED ORDER — HYDROXYZINE HCL 10 MG PO TABS: 10.0000 mg | ORAL_TABLET | Freq: Three times a day (TID) | ORAL | 2 refills | Status: DC | PRN

## 2018-03-02 MED ORDER — LEVOCETIRIZINE DIHYDROCHLORIDE 5 MG PO TABS: 5.0000 mg | ORAL_TABLET | Freq: Every evening | ORAL | 2 refills | Status: DC

## 2018-03-02 MED FILL — predniSONE 20 MG TABS: 20 | 3 days supply | Qty: 9 | Fill #0

## 2018-03-02 MED FILL — LEVOCETIRIZINE 5 MG TABLET: 5 | 30 days supply | Qty: 30 | Fill #0

## 2018-03-02 NOTE — Patient Instructions (Addendum)
If the nightmares continue, please let me know so that I can give you a medication that will help with sleep and nightmares.  I sent a steroid for you to take for 3 days for the chest discomfort. I will check if there is gelatin in your supplements.    Living With Anxiety After being diagnosed with an anxiety disorder, you may be relieved to know why you have felt or behaved a certain way. It is natural to also feel overwhelmed about the treatment ahead and what it will mean for your life. With care and support, you can manage this condition and recover from it. How to cope with anxiety Dealing with stress Stress is your body's reaction to life changes and events, both good and bad. Stress can last just a few hours or it can be ongoing. Stress can play a major role in anxiety, so it is important to learn both how to cope with stress and how to think about it differently. Talk with your health care provider or a counselor to learn more about stress reduction. He or she may suggest some stress reduction techniques, such as:  Music therapy. This can include creating or listening to music that you enjoy and that inspires you.  Mindfulness-based meditation. This involves being aware of your normal breaths, rather than trying to control your breathing. It can be done while sitting or walking.  Centering prayer. This is a kind of meditation that involves focusing on a word, phrase, or sacred image that is meaningful to you and that brings you peace.  Deep breathing. To do this, expand your stomach and inhale slowly through your nose. Hold your breath for 3-5 seconds. Then exhale slowly, allowing your stomach muscles to relax.  Self-talk. This is a skill where you identify thought patterns that lead to anxiety reactions and correct those thoughts.  Muscle relaxation. This involves tensing muscles then relaxing them.  Choose a stress reduction technique that fits your lifestyle and personality. Stress  reduction techniques take time and practice. Set aside 5-15 minutes a day to do them. Therapists can offer training in these techniques. The training may be covered by some insurance plans. Other things you can do to manage stress include:  Keeping a stress diary. This can help you learn what triggers your stress and ways to control your response.  Thinking about how you respond to certain situations. You may not be able to control everything, but you can control your reaction.  Making time for activities that help you relax, and not feeling guilty about spending your time in this way.  Therapy combined with coping and stress-reduction skills provides the best chance for successful treatment. Medicines Medicines can help ease symptoms. Medicines for anxiety include:  Anti-anxiety drugs.  Antidepressants.  Beta-blockers.  Medicines may be used as the main treatment for anxiety disorder, along with therapy, or if other treatments are not working. Medicines should be prescribed by a health care provider. Relationships Relationships can play a big part in helping you recover. Try to spend more time connecting with trusted friends and family members. Consider going to couples counseling, taking family education classes, or going to family therapy. Therapy can help you and others better understand the condition. How to recognize changes in your condition Everyone has a different response to treatment for anxiety. Recovery from anxiety happens when symptoms decrease and stop interfering with your daily activities at home or work. This may mean that you will start to:  Have  better concentration and focus.  Sleep better.  Be less irritable.  Have more energy.  Have improved memory.  It is important to recognize when your condition is getting worse. Contact your health care provider if your symptoms interfere with home or work and you do not feel like your condition is improving. Where to  find help and support: You can get help and support from these sources:  Self-help groups.  Online and [employer].  A trusted spiritual leader.  Couples counseling.  Family education classes.  Family therapy.  Follow these instructions at home:  Eat a healthy diet that includes plenty of vegetables, fruits, whole grains, low-fat dairy products, and lean protein. Do not eat a lot of foods that are high in solid fats, added sugars, or salt.  Exercise. Most adults should do the following: ? Exercise for at least 150 minutes each week. The exercise should increase your heart rate and make you sweat (moderate-intensity exercise). ? Strengthening exercises at least twice a week.  Cut down on caffeine, tobacco, alcohol, and other potentially harmful substances.  Get the right amount and quality of sleep. Most adults need 7-9 hours of sleep each night.  Make choices that simplify your life.  Take over-the-counter and prescription medicines only as told by your health care provider.  Avoid caffeine, alcohol, and certain over-the-counter cold medicines. These may make you feel worse. Ask your pharmacist which medicines to avoid.  Keep all follow-up visits as told by your health care provider. This is important. Questions to ask your health care provider  Would I benefit from therapy?  How often should I follow up with a health care provider?  How long do I need to take medicine?  Are there any long-term side effects of my medicine?  Are there any alternatives to taking medicine? Contact a health care provider if:  You have a hard time staying focused or finishing daily tasks.  You spend many hours a day feeling worried about everyday life.  You become exhausted by worry.  You start to have headaches, feel tense, or have nausea.  You urinate more than normal.  You have diarrhea. Get help right away if:  You have a racing heart and shortness of  breath.  You have thoughts of hurting yourself or others. If you ever feel like you may hurt yourself or others, or have thoughts about taking your own life, get help right away. You can go to your nearest emergency department or call:  Your local emergency services (911 in the [place]  A suicide crisis helpline, such as the [facility] at (716)187-5475. This is open 24-hours a day.  Summary  Taking steps to deal with stress can help calm you.  Medicines cannot cure anxiety disorders, but they can help ease symptoms.  Family, friends, and partners can play a big part in helping you recover from an anxiety disorder. This information is not intended to replace advice given to you by your health care provider. Make sure you discuss any questions you have with your health care provider. Document Released: 03/23/2016 Document Revised: 03/23/2016 Document Reviewed: 03/23/2016 Elsevier Interactive Patient Education  2018 [employer].  Costochondritis Costochondritis is swelling and irritation (inflammation) of the tissue (cartilage) that connects your ribs to your breastbone (sternum). This causes pain in the front of your chest. Usually, the pain:  Starts gradually.  Is in more than one rib.  This condition usually goes away on its own over time. Follow  these instructions at home:  Do not do anything that makes your pain worse.  If directed, put ice on the painful area: ? Put ice in a plastic bag. ? Place a towel between your skin and the bag. ? Leave the ice on for 20 minutes, 2-3 times a day.  If directed, put heat on the affected area as often as told by your doctor. Use the heat source that your doctor tells you to use, such as a moist heat pack or a heating pad. ? Place a towel between your skin and the heat source. ? Leave the heat on for 20-30 minutes. ? Take off the heat if your skin turns bright red. This is very important if you cannot feel pain,  heat, or cold. You may have a greater risk of getting burned.  Take over-the-counter and prescription medicines only as told by your doctor.  Return to your normal activities as told by your doctor. Ask your doctor what activities are safe for you.  Keep all follow-up visits as told by your doctor. This is important. Contact a doctor if:  You have chills or a fever.  Your pain does not go away or it gets worse.  You have a cough that does not go away. Get help right away if:  You are short of breath. This information is not intended to replace advice given to you by your health care provider. Make sure you discuss any questions you have with your health care provider. Document Released: 09/15/2007 Document Revised: 10/17/2015 Document Reviewed: 07/23/2015 Elsevier Interactive Patient Education  [employer].

## 2018-03-02 NOTE — Progress Notes (Signed)
[facility]   Progress Note: General Provider: Lanae Boast, FNP  SUBJECTIVE:   Betty Cain is a 42 y.o. female who  has a past medical history of Anemia, Asthma, Common migraine with intractable migraine (06/28/2017), GERD (gastroesophageal reflux disease), IUD (intrauterine device) in place (10-01-2011), Neonatal death, Palpitations, Shortness of breath, Spinal headache, and Valvular heart disease.. Patient presents today for Follow-up and Abdominal Pain  Patient states that she took all of the medications that were prescribed at the last visit. She did not like the side effects of cymbalta and stopped prior to today's visit. She states that it made her feel more tired. She is doing well with the increase of gabapentin at night and takes the vistaril TID. Patient states that she is having difficulty sleeping due to having multiple nightmares. Patient states that she is seeing bloody people. States that this keeps her awake at night. Patient states that she is seeing unknown people in her dreams. She denies a history of abuse or trauma. Patient denies auditory or visual  Hallucinations. Patient denies suicidal/homicidal ideations, intent or plan. Denies seeing psychiatric providers in the past.  States that these dreams started 3 days ago and denies triggers to this.   Review of Systems  Constitutional: Negative.   HENT: Negative.   Eyes: Negative.   Respiratory: Negative.   Cardiovascular: Negative.   Gastrointestinal: Negative.   Genitourinary: Negative.   Musculoskeletal: Positive for joint pain.       Chest discomfort with moving.    Skin: Negative.   Neurological: Negative.   Psychiatric/Behavioral: Negative.      OBJECTIVE: BP 119/64 (BP Location: Left Arm, Patient Position: Sitting, Cuff Size: Normal)   Pulse 98   Temp 98.7 F (37.1 C) (Oral)   Resp 16   Ht 4\' 10"  (1.473 m)   Wt 162 lb (73.5 kg)   SpO2 100%   BMI 33.86  kg/m   Wt Readings from Last 3 Encounters:  03/02/18 162 lb (73.5 kg)  02/27/18 161 lb 4 oz (73.1 kg)  02/01/18 162 lb (73.5 kg)     Physical Exam  Constitutional: She is oriented to person, place, and time. She appears well-developed and well-nourished. No distress.  HENT:  Head: Normocephalic and atraumatic.  Eyes: Pupils are equal, round, and reactive to light. Conjunctivae and EOM are normal.  Neck: Normal range of motion.  Cardiovascular: Normal rate, regular rhythm, normal heart sounds and intact distal pulses.  Pulmonary/Chest: Effort normal and breath sounds normal. No respiratory distress. She exhibits tenderness (reproducible tenderness noted to sternum).    Abdominal: Soft. Bowel sounds are normal. She exhibits no distension.  Musculoskeletal: Normal range of motion.  Neurological: She is alert and oriented to person, place, and time.  Skin: Skin is warm and dry.  Psychiatric: She has a normal mood and affect. Her behavior is normal. Thought content normal.  Nursing note and vitals reviewed.   ASSESSMENT/PLAN:   1. Environmental allergies - levocetirizine (XYZAL) 5 MG tablet; Take 1 tablet (5 mg total) by mouth every evening.  Dispense: 30 tablet; Refill: 2  2. Anxiety Stable. Cymbalta d/c'd by patient. Will continue with gabapentin and vistaril.  - hydrOXYzine (ATARAX/VISTARIL) 10 MG tablet; Take 1 tablet (10 mg total) by mouth 3 (three) times daily as needed.  Dispense: 30 tablet; Refill: 2  3. Costochondritis - predniSONE (DELTASONE) 20 MG tablet; Take 3 tablets (60 mg total) by mouth daily with breakfast for 3 days.  Dispense: 9 tablet; Refill: 0  4. Nightmare Discussed Prazosin for nightmares and sleep disturbance. Patient states that she has been having low BP readings. Will see if nightmares continue. Recommend therapist and psychiatric eval. Patient declines.   5. Vitamin D deficiency Will d/c ergocalciferol due to containing gelatin. Will start D3 in  tablet form.  - Cholecalciferol 20 MCG (800 UNIT) TABS; Take 1 tablet by mouth daily.  Dispense: 30 tablet; Refill: 5   Language barrier- face to face interpretor used.    Time Spent: 25 minutes face-to-face with this patient discussing problems, treatments, and answering patient's questions.    The patient was given clear instructions to go to ER or return to medical center if symptoms do not improve, worsen or new problems develop. The patient verbalized understanding and agreed with plan of care.   [name]. Nathaneil Canary, FNP-BC [facility] [address] [place], [place] [address] [phone]     This note has been created with Dragon speech recognition software and smart phrase technology. Any transcriptional errors are unintentional.

## 2018-03-02 NOTE — Telephone Encounter (Signed)
Called and left a message that vitamin D once weekly supplement has gelatin in it and that we are unsure if it has pork in it. Advised that we are sending in a tablet that she will need to take every day. Asked if any questions to call back to our office. Thanks!

## 2018-03-15 ENCOUNTER — Ambulatory Visit (INDEPENDENT_AMBULATORY_CARE_PROVIDER_SITE_OTHER)
Admission: RE | Admit: 2018-03-15 | Discharge: 2018-03-15 | Disposition: A | Discharge status: Home / Self Care | Payer: No Typology Code available for payment source | Source: Ambulatory Visit | Attending: Gastroenterology | Admitting: Gastroenterology

## 2018-03-15 ENCOUNTER — Ambulatory Visit: Payer: Self-pay | Attending: Family Medicine

## 2018-03-15 DIAGNOSIS — R1033 Periumbilical pain: Secondary | ICD-10-CM

## 2018-03-15 MED ORDER — IOPAMIDOL (ISOVUE-300) INJECTION 61%
100.0000 mL | Freq: Once | INTRAVENOUS | Status: AC | PRN
  Administered 2018-03-15: 100 mL via INTRAVENOUS

## 2018-03-28 ENCOUNTER — Ambulatory Visit: Payer: Self-pay | Attending: Family Medicine

## 2018-03-29 ENCOUNTER — Other Ambulatory Visit: Payer: Self-pay | Admitting: Family Medicine

## 2018-03-29 DIAGNOSIS — E876 Hypokalemia: Secondary | ICD-10-CM

## 2018-03-29 MED FILL — PANTOPRAZOLE SOD DR 40 MG T: 40 | 30 days supply | Qty: 60 | Fill #1

## 2018-03-29 MED FILL — GABAPENTIN 300 MG CAPSULE: 300 | 30 days supply | Qty: 30 | Fill #2

## 2018-03-29 MED FILL — ?METOPROLOL TARTRATE 25 MG: 25 | 30 days supply | Qty: 90 | Fill #1

## 2018-03-29 MED FILL — LEVOCETIRIZINE 5 MG TABLET: 5 | 30 days supply | Qty: 30 | Fill #1

## 2018-03-29 MED FILL — POTASSIUM CL ER 20 MEQ TAB: 20 | 30 days supply | Qty: 30 | Fill #0

## 2018-03-29 MED FILL — VIT D2 1.25 MG (50,000 UNIT: 1.25 MG | 28 days supply | Qty: 4 | Fill #2

## 2018-03-29 MED FILL — FERROUS SULFATE 325 MG TAB: 325 (65 FE) | 30 days supply | Qty: 30 | Fill #1

## 2018-03-29 MED FILL — hydrOXYzine HCL 10 MG TABS: 10 | 10 days supply | Qty: 30 | Fill #0

## 2018-04-04 ENCOUNTER — Encounter: Payer: Self-pay | Admitting: Nurse Practitioner

## 2018-04-04 ENCOUNTER — Ambulatory Visit (INDEPENDENT_AMBULATORY_CARE_PROVIDER_SITE_OTHER): Payer: Self-pay | Admitting: Nurse Practitioner

## 2018-04-04 VITALS — BP 120/90 | HR 118 | Ht <= 58 in | Wt 166.0 lb

## 2018-04-04 DIAGNOSIS — R9431 Abnormal electrocardiogram [ECG] [EKG]: Secondary | ICD-10-CM

## 2018-04-04 DIAGNOSIS — R079 Chest pain, unspecified: Secondary | ICD-10-CM

## 2018-04-04 MED ORDER — METOPROLOL TARTRATE 50 MG PO TABS: 50.0000 mg | ORAL_TABLET | Freq: Two times a day (BID) | ORAL | 3 refills | Status: DC

## 2018-04-04 NOTE — Progress Notes (Signed)
CARDIOLOGY OFFICE NOTE  Date:  04/04/2018    Elwyn Reach Date of Birth: 1975-06-22 Medical Record #[id number]  PCP:  Lanae Boast, FNP  Cardiologist:  Jennings Books    Chief Complaint  Patient presents with  . Follow-up    History of Present Illness: Betty Cain is a 42 y.o. female who presents today for a 7 month check. Seen for Dr. Tamala Julian. Previously seen by Dr. Gwenlyn Found. Primarily follows with me.   Seen in July of 2018 at request of PCP for palpitations and chest pain. She had had similar complaints in 2015 and work up then showed structurally normal heart by echo and a normal 30 day monitor. CT angio of the chest in 2018 was unremarkable - no coronary calcification.   I saw her back in March of 2019 - she had lots of somatic complaints. She had seen neurology the day before for chronic migraines, paresthesias of all 4 extremities and face. Noted that there was the possibility of anxiety/depressionas the culprit. She was to have MRI of the brain and had labs. Gabapentin was increased. I thought her cardiac status was stable. She is quite stressed with 4 kids.   Last seen in May - still with lots of chest pain - GXT was arranged and this was negative.   Comes in today. Here with the interpreter. She says she is ok but endorses lot of symptoms - this includes chest pain - still sharp - wakes up with it at times. Sometimes notes a "pressure".No exertional symptoms.  Has DOE. No exercise. Active with her 4 children from 6 to 15 years. Does not feel palpitations with heart skipping and flipping. But notes it is "fast". Feels dizzy and lightheaded at times. No passing out. She would like to lose weight. She points to an implant in the left upper arm - this is her form of birth control. She denies pregnancy.   Past Medical History:  Diagnosis Date  . Anemia   . Asthma   . Common migraine with intractable migraine 06/28/2017  . GERD (gastroesophageal reflux disease)    pos H  pylori  . IUD (intrauterine device) in place 09/03/2011   Due out in 11-Aug-2016  . Neonatal death    Vaginal delivery, full term-lived x2 hours.   . Palpitations   . Shortness of breath   . Spinal headache   . Valvular heart disease     Past Surgical History:  Procedure Laterality Date  . ADENOIDECTOMY     as a child  . boil  2003   right elbow  . CESAREAN SECTION     x4  . CESAREAN SECTION  06/02/2011   Procedure: CESAREAN SECTION;  Surgeon: [name], MD;  Location: [facility];  Service: Gynecology;  Laterality: N/A;  Primary Cesarean Section Delivery Baby Boy @ 0004, Apgars 9/9  . CESAREAN SECTION N/A 12/10/2013   Procedure: REPEAT CESAREAN SECTION;  Surgeon: [name], MD;  Location: [facility];  Service: Obstetrics;  Laterality: N/A;     Medications: Current Meds  Medication Sig  . acetaminophen (TYLENOL) 500 MG tablet Take 1 tablet (500 mg total) by mouth every 6 (six) hours as needed.  . Blood Pressure Monitoring DEVI 1 each by Does not apply route daily.  Marland Kitchen etonogestrel (NEXPLANON) 68 MG IMPL implant 1 each by Subdermal route once.  . FEROSUL 325 (65 Fe) MG tablet TAKE 1 TABLET (325 MG TOTAL) BY MOUTH DAILY.  . fluticasone (FLONASE)  50 MCG/ACT nasal spray Place 2 sprays into both nostrils daily.  . fluticasone furoate-vilanterol (BREO ELLIPTA) 100-25 MCG/INH AEPB Inhale 1 puff into the lungs daily.  Marland Kitchen gabapentin (NEURONTIN) 300 MG capsule Take 1 capsule (300 mg total) by mouth at bedtime.  . hydrOXYzine (ATARAX/VISTARIL) 10 MG tablet Take 1 tablet (10 mg total) by mouth 3 (three) times daily as needed.  Marland Kitchen ipratropium (ATROVENT) 0.06 % nasal spray Place 2 sprays into both nostrils as needed for rhinitis.  Marland Kitchen levocetirizine (XYZAL) 5 MG tablet Take 1 tablet (5 mg total) by mouth every evening.  . pantoprazole (PROTONIX) 40 MG tablet Take 1 tablet (40 mg total) by mouth 2 (two) times daily before a meal.  . potassium chloride SA (K-DUR,KLOR-CON) 20 MEQ tablet TAKE 1 TABLET (20  MEQ TOTAL) BY MOUTH DAILY.  . [DISCONTINUED] metoprolol tartrate (LOPRESSOR) 25 MG tablet TAKE 1 AND 1/2 TABLETS BY MOUTH 2 (TWO) TIMES DAILY.     Allergies: No Known Allergies  Social History: The patient  reports that she has never smoked. She has never used smokeless tobacco. She reports that she does not drink alcohol or use drugs.   Family History: The patient's family history includes Diabetes in her father, mother, and sister.   Review of Systems: Please see the history of present illness.   Otherwise, the review of systems is positive for none.   All other systems are reviewed and negative.   Physical Exam: VS:  BP 120/90   Pulse (!) 118   Ht 4\' 10"  (1.473 m)   Wt 166 lb (75.3 kg)   BMI 34.69 kg/m  .  BMI Body mass index is 34.69 kg/m.  Wt Readings from Last 3 Encounters:  04/04/18 166 lb (75.3 kg)  03/02/18 162 lb (73.5 kg)  02/27/18 161 lb 4 oz (73.1 kg)   BP recheck by me is 128/80  General: Pleasant. Well developed, well nourished and in no acute distress.   HEENT: Normal.  Neck: Supple, no JVD, carotid bruits, or masses noted.  Cardiac: Regular rate and rhythm. HR is about 110 by my count. No murmurs, rubs, or gallops. No edema.  Respiratory:  Lungs are clear to auscultation bilaterally with normal work of breathing.  GI: Soft and nontender.  MS: No deformity or atrophy. Gait and ROM intact.  Skin: Warm and dry. Color is normal.  Neuro:  Strength and sensation are intact and no gross focal deficits noted.  Psych: Alert, appropriate and with normal affect.   LABORATORY DATA:  EKG:  EKG is ordered today. This demonstrates sinus tach - she has diffuse T wave changes - this would be new compared to my last EKG from May - these were present on tracings back in October.  Lab Results  Component Value Date   WBC 10.3 01/16/2018   HGB 12.2 01/16/2018   HCT 37.9 01/16/2018   PLT 362 01/16/2018   GLUCOSE 190 (H) 01/16/2018   CHOL 165 10/08/2014   TRIG 107  10/08/2014   HDL 30 (L) 10/08/2014   LDLCALC 114 (H) 10/08/2014   ALT 13 01/16/2018   AST 21 01/16/2018   NA 138 01/16/2018   K 4.5 01/20/2018   CL 101 01/16/2018   CREATININE 0.63 02/27/2018   BUN 10 02/27/2018   CO2 25 01/16/2018   TSH 1.577 04/26/2017   HGBA1C 5.7 12/01/2016     BNP (last 3 results) No results for input(s): BNP in the last 8760 hours.  ProBNP (last 3 results)  No results for input(s): PROBNP in the last 8760 hours.   Other Studies Reviewed Today:  GXT 09/2017 Study Highlights    Blood pressure demonstrated a normal response to exercise.  There was no ST segment deviation noted during stress.  Clinically and electrically negative for ischemia  Negative GXT      CT Angio 08/10/2016: IMPRESSION: Negative for acute pulmonary embolus or aortic dissection. Clear lung fields.  ECHOCARDIOGRAPHY 2015: Study Conclusions  - Left ventricle: The cavity size was normal. Wall thickness was normal. Systolic function was normal. The estimated ejection fraction was in the range of 60% to 65%.  [place] June 2015:  NSR and ST   Assessment & Plan:  1. Chest pain - this has been chronic - she has had prior benign cardiac work up - GXT earlier this year was ok. Her symptoms seem basically unchanged over the past several years - but has had EKG changes on prior EKGs since my last visit. Will arrange for coronary CT with FFR - I am increasing her Lopressor as well today.   2. Migraines - not reported to me today.   3. Palpitations/heart racing - chronic tachycardia - increasing her Lopressor today - I suspect this is multifactorial with deconditioning and obesity.   4. Obesity - regular exercise encouraged. Her culture involves eating lots of foods that are "white" and she admits she likes sweets. Weight loss is encouraged.   Current medicines are reviewed with the patient today.  The patient does not have concerns regarding  medicines other than what has been noted above.  The following changes have been made:  See above.  Labs/ tests ordered today include:    Orders Placed This Encounter  Procedures  . CT CORONARY MORPH W/CTA COR W/SCORE W/CA W/CM &/OR WO/CM  . CT CORONARY FRACTIONAL FLOW RESERVE DATA PREP  . CT CORONARY FRACTIONAL FLOW RESERVE FLUID ANALYSIS  . Basic metabolic panel     Disposition:   Further disposition is pending.    Patient is agreeable to this plan and will call if any problems develop in the interim.   SignedTruitt Merle, NP  04/04/2018 9:32 AM  [facility] [address] [place], [place]  [address] Phone: [phone] Fax: [phone]

## 2018-04-04 NOTE — Patient Instructions (Signed)
We will be checking the following labs today - NONE  Medication Instructions:    Continue with your current medicines. BUT  Increase the Lopressor to 50 mg twice a day - this has been sent to [facility]   If you need a refill on your cardiac medications before your next appointment, please call your pharmacy.     Testing/Procedures To Be Arranged:  Cardiac CT with FFR  Follow-Up:   Let's see how your test turns out.     At [facility], you and your health needs are our priority.  As part of our continuing mission to provide you with exceptional heart care, we have created designated Provider Care Teams.  These Care Teams include your primary Cardiologist (physician) and Advanced Practice Providers (APPs -  Physician Assistants and Nurse Practitioners) who all work together to provide you with the care you need, when you need it.  Special Instructions:   Please arrive at the [facility] main entrance of [facility] at ________________________________ _____________________________________ AM (30-45 minutes prior to test start time)  [facility] [address] [place],  [address] [phone]  Proceed to the [facility] Radiology Department (First Floor).  Please follow these instructions carefully (unless otherwise directed):   On the Night Before the Test: Be sure to Drink plenty of water. Do not consume any caffeinated/decaffeinated beverages or chocolate 12 hours prior to your test. Do not take any antihistamines 12 hours prior to your test.   On the Day of the Test: Drink plenty of water. Do not drink any water within one hour of the test. Do not eat any food 4 hours prior to the test. You may take your regular medications prior to the test.  Take metoprolol (Lopressor) 100 mg two hours prior to test for this day only. Otherwise, stay on your 50 mg twice a day.        After the Test: Drink plenty of water. After receiving  IV contrast, you may experience a mild flushed feeling. This is normal. On occasion, you may experience a mild rash up to 24 hours after the test. This is not dangerous. If this occurs, you can take Benadryl 25 mg and increase your fluid intake. If you experience trouble breathing, this can be serious. If it is severe call 911 IMMEDIATELY. If it is mild, please call our office.  .   Call the [place] office at [phone] if you have any questions, problems or concerns.

## 2018-04-06 MED FILL — METOPROLOL TARTRATE 50 MG T: 50 | 30 days supply | Qty: 60 | Fill #0

## 2018-04-07 MED FILL — !BREO ELLIPTA 100-25 MCG IN: 100-25 | 30 days supply | Qty: 60 | Fill #5

## 2018-04-10 ENCOUNTER — Telehealth: Payer: Self-pay | Admitting: Interventional Cardiology

## 2018-04-10 MED ORDER — METOPROLOL TARTRATE 25 MG PO TABS: 37.5000 mg | ORAL_TABLET | Freq: Two times a day (BID) | ORAL | 3 refills | Status: DC

## 2018-04-10 MED FILL — FLUTICASONE PROP 50 MCG SPR: 50 | 30 days supply | Qty: 16 | Fill #6

## 2018-04-10 NOTE — Telephone Encounter (Signed)
Spoke with pt and made her aware of recommendation.  Pt asked that medication be sent to [place].  Pt appreciative for call.

## 2018-04-10 NOTE — Telephone Encounter (Signed)
May cut the dose back to prior dose - 37.5 mg BID  Burtis Junes, RN, [place] [address] [place], [place]  [address] [phone]

## 2018-04-10 NOTE — Addendum Note (Signed)
Addended by: Loren Racer on: 04/10/2018 03:18 PM   Modules accepted: Orders

## 2018-04-10 NOTE — Telephone Encounter (Signed)
Spoke with pt and family member with pt's permission.  Pt denies rash but has been itching since increasing the dose of Metoprolol.  States this same thing happened back in June.  Pt has taken Atarax and Xyzal but neither have helped. Advised pt I would send message to Dr. Tamala Julian and Truitt Merle, NP for review.

## 2018-04-10 NOTE — Telephone Encounter (Signed)
Pt c/o medication issue:  1. Name of Medication: metoprolol tartrate (LOPRESSOR) 50 MG tablet  2. How are you currently taking this medication (dosage and times per day)?Take 1 tablet (50 mg total) by mouth 2 (two) times daily.  3. Are you having a reaction (difficulty breathing--STAT)?  Itchy rash all over body  4. What is your medication issue? Patient is concerned medication is causing her rash

## 2018-04-11 ENCOUNTER — Telehealth: Payer: Self-pay | Admitting: Interventional Cardiology

## 2018-04-11 MED FILL — METOPROLOL TARTRATE 25 MG T: 25 | 30 days supply | Qty: 90 | Fill #0

## 2018-04-11 NOTE — Telephone Encounter (Signed)
Spoke with husband, DPR on file.  Made him aware that Truitt Merle, NP advised for pt to decrease Metoprolol back to 37.5mg  BID.  Husband verbalized understanding and was appreciative for call.

## 2018-04-11 NOTE — Telephone Encounter (Signed)
New Message   Pt c/o medication issue:  1. Name of Medication: metoprolol tartrate (LOPRESSOR) 25 MG tablet  2. How are you currently taking this medication (dosage and times per day)? Pt not sure   3. Are you having a reaction (difficulty breathing--STAT)? No   4. What is your medication issue? Pt is calling not sure the amount of medication suppose to take

## 2018-04-13 NOTE — Addendum Note (Signed)
Addended by: Harland German A on: 04/13/2018 10:55 AM   Modules accepted: Orders

## 2018-04-14 ENCOUNTER — Encounter: Payer: Self-pay | Admitting: Family Medicine

## 2018-04-14 ENCOUNTER — Ambulatory Visit (INDEPENDENT_AMBULATORY_CARE_PROVIDER_SITE_OTHER): Payer: Self-pay | Admitting: Family Medicine

## 2018-04-14 VITALS — BP 122/66 | HR 111 | Temp 99.3°F | Resp 16 | Ht <= 58 in | Wt 160.0 lb

## 2018-04-14 DIAGNOSIS — J40 Bronchitis, not specified as acute or chronic: Secondary | ICD-10-CM

## 2018-04-14 DIAGNOSIS — J0110 Acute frontal sinusitis, unspecified: Secondary | ICD-10-CM

## 2018-04-14 LAB — POC INFLUENZA A&B (BINAX/QUICKVUE)
Influenza A, POC: NEGATIVE
Influenza B, POC: NEGATIVE

## 2018-04-14 MED ORDER — ALBUTEROL SULFATE HFA 108 (90 BASE) MCG/ACT IN AERS: 2.0000 | INHALATION_SPRAY | Freq: Four times a day (QID) | RESPIRATORY_TRACT | 2 refills | Status: DC | PRN

## 2018-04-14 MED ORDER — ALBUTEROL SULFATE (2.5 MG/3ML) 0.083% IN NEBU
2.5000 mg | INHALATION_SOLUTION | Freq: Once | RESPIRATORY_TRACT | Status: AC
  Administered 2018-04-14: 2.5 mg via RESPIRATORY_TRACT

## 2018-04-14 MED ORDER — PROMETHAZINE-DM 6.25-15 MG/5ML PO SYRP: 5.0000 mL | ORAL_SOLUTION | Freq: Four times a day (QID) | ORAL | 0 refills | Status: DC | PRN

## 2018-04-14 MED ORDER — METHYLPREDNISOLONE 4 MG PO TBPK: ORAL_TABLET | ORAL | 0 refills | Status: DC

## 2018-04-14 MED ORDER — IPRATROPIUM BROMIDE 0.02 % IN SOLN
0.5000 mg | Freq: Once | RESPIRATORY_TRACT | Status: AC
  Administered 2018-04-14: 0.5 mg via RESPIRATORY_TRACT

## 2018-04-14 MED ORDER — AMOXICILLIN-POT CLAVULANATE 875-125 MG PO TABS: 1.0000 | ORAL_TABLET | Freq: Two times a day (BID) | ORAL | 0 refills | Status: AC

## 2018-04-14 MED FILL — !VENTOLIN HFA INHALER: 108 (90 BAS | 25 days supply | Qty: 18 | Fill #0

## 2018-04-14 MED FILL — PROMETHAZINE W/DM SYRUP: 6.25-15 | 5 days supply | Qty: 118 | Fill #0

## 2018-04-14 MED FILL — ?METHYLPREDNISOLONE 4 MG TA: 4 | 6 days supply | Qty: 21 | Fill #0

## 2018-04-14 MED FILL — AMOX-CLAV 875-125 MG TABLET: 875-125 | 7 days supply | Qty: 14 | Fill #0

## 2018-04-14 NOTE — Patient Instructions (Addendum)
Sinusitis, Adult Sinusitis is soreness and swelling (inflammation) of your sinuses. Sinuses are hollow spaces in the bones around your face. They are located:  Around your eyes.  In the middle of your forehead.  Behind your nose.  In your cheekbones. Your sinuses and nasal passages are lined with a fluid called mucus. Mucus drains out of your sinuses. Swelling can trap mucus in your sinuses. This lets germs (bacteria, virus, or fungus) grow, which leads to infection. Most of the time, this condition is caused by a virus. What are the causes? This condition is caused by:  Allergies.  Asthma.  Germs.  Things that block your nose or sinuses.  Growths in the nose (nasal polyps).  Chemicals or irritants in the air.  Fungus (rare). What increases the risk? You are more likely to develop this condition if:  You have a weak body defense system (immune system).  You do a lot of swimming or diving.  You use nasal sprays too much.  You smoke. What are the signs or symptoms? The main symptoms of this condition are pain and a feeling of pressure around the sinuses. Other symptoms include:  Stuffy nose (congestion).  Runny nose (drainage).  Swelling and warmth in the sinuses.  Headache.  Toothache.  A cough that may get worse at night.  Mucus that collects in the throat or the back of the nose (postnasal drip).  Being unable to smell and taste.  Being very tired (fatigue).  A fever.  Sore throat.  Bad breath. How is this diagnosed? This condition is diagnosed based on:  Your symptoms.  Your medical history.  A physical exam.  Tests to find out if your condition is short-term (acute) or long-term (chronic). Your doctor may: ? Check your nose for growths (polyps). ? Check your sinuses using a tool that has a light (endoscope). ? Check for allergies or germs. ? Do imaging tests, such as an MRI or CT scan. How is this treated? Treatment for this condition  depends on the cause and whether it is short-term or long-term.  If caused by a virus, your symptoms should go away on their own within 10 days. You may be given medicines to relieve symptoms. They include: ? Medicines that shrink swollen tissue in the nose. ? Medicines that treat allergies (antihistamines). ? A spray that treats swelling of the nostrils. ? Rinses that help get rid of thick mucus in your nose (nasal saline washes).  If caused by bacteria, your doctor may wait to see if you will get better without treatment. You may be given antibiotic medicine if you have: ? A very bad infection. ? A weak body defense system.  If caused by growths in the nose, you may need to have surgery. Follow these instructions at home: Medicines  Take, use, or apply over-the-counter and prescription medicines only as told by your doctor. These may include nasal sprays.  If you were prescribed an antibiotic medicine, take it as told by your doctor. Do not stop taking the antibiotic even if you start to feel better. Hydrate and humidify   Drink enough water to keep your pee (urine) pale yellow.  Use a cool mist humidifier to keep the humidity level in your home above 50%.  Breathe in steam for 10-15 minutes, 3-4 times a day, or as told by your doctor. You can do this in the bathroom while a hot shower is running.  Try not to spend time in cool or dry air.   Rest  Rest as much as you can.  Sleep with your head raised (elevated).  Make sure you get enough sleep each night. General instructions   Put a warm, moist washcloth on your face 3-4 times a day, or as often as told by your doctor. This will help with discomfort.  Wash your hands often with soap and water. If there is no soap and water, use hand sanitizer.  Do not smoke. Avoid being around people who are smoking (secondhand smoke).  Keep all follow-up visits as told by your doctor. This is important. Contact a doctor if:  You  have a fever.  Your symptoms get worse.  Your symptoms do not get better within 10 days. Get help right away if:  You have a very bad headache.  You cannot stop throwing up (vomiting).  You have very bad pain or swelling around your face or eyes.  You have trouble seeing.  You feel confused.  Your neck is stiff.  You have trouble breathing. Summary  Sinusitis is swelling of your sinuses. Sinuses are hollow spaces in the bones around your face.  This condition is caused by tissues in your nose that become inflamed or swollen. This traps germs. These can lead to infection.  If you were prescribed an antibiotic medicine, take it as told by your doctor. Do not stop taking it even if you start to feel better.  Keep all follow-up visits as told by your doctor. This is important. This information is not intended to replace advice given to you by your health care provider. Make sure you discuss any questions you have with your health care provider. Document Released: 09/15/2007 Document Revised: 08/29/2017 Document Reviewed: 08/29/2017 Elsevier Interactive Patient Education  2019 Elsevier Inc.  Acute Bronchitis, Adult Acute bronchitis is when air tubes (bronchi) in the lungs suddenly get swollen. The condition can make it hard to breathe. It can also cause these symptoms:  A cough.  Coughing up clear, yellow, or green mucus.  Wheezing.  Chest congestion.  Shortness of breath.  A fever.  Body aches.  Chills.  A sore throat. Follow these instructions at home:  Medicines  Take over-the-counter and prescription medicines only as told by your doctor.  If you were prescribed an antibiotic medicine, take it as told by your doctor. Do not stop taking the antibiotic even if you start to feel better. General instructions  Rest.  Drink enough fluids to keep your pee (urine) pale yellow.  Avoid smoking and secondhand smoke. If you smoke and you need help quitting, ask  your doctor. Quitting will help your lungs heal faster.  Use an inhaler, cool mist vaporizer, or humidifier as told by your doctor.  Keep all follow-up visits as told by your doctor. This is important. How is this prevented? To lower your risk of getting this condition again:  Wash your hands often with soap and water. If you cannot use soap and water, use hand sanitizer.  Avoid contact with people who have cold symptoms.  Try not to touch your hands to your mouth, nose, or eyes.  Make sure to get the flu shot every year. Contact a doctor if:  Your symptoms do not get better in 2 weeks. Get help right away if:  You cough up blood.  You have chest pain.  You have very bad shortness of breath.  You become dehydrated.  You faint (pass out) or keep feeling like you are going to pass out.  You  keep throwing up (vomiting).  You have a very bad headache.  Your fever or chills gets worse. This information is not intended to replace advice given to you by your health care provider. Make sure you discuss any questions you have with your health care provider. Document Released: 09/15/2007 Document Revised: 11/10/2016 Document Reviewed: 09/17/2015 Elsevier Interactive Patient Education  2019 [employer].

## 2018-04-14 NOTE — Progress Notes (Signed)
Acute Office Visit  Subjective:    Patient ID: Betty Cain, female    DOB: [birth year], 43 y.o.   MRN: [id number]  Chief Complaint  Patient presents with  . Cough    x 2 weeks   . Sinus Problem    nasal drainage/ ears hurt/eyes wattering     Cough  This is a new problem. The current episode started 1 to 4 weeks ago. The problem has been gradually worsening. The problem occurs constantly. The cough is productive of sputum. Associated symptoms include a sore throat. Pertinent negatives include no chills or fever.  Sinus Problem  Associated symptoms include congestion, coughing and a sore throat. Pertinent negatives include no chills.    Past Medical History:  Diagnosis Date  . Anemia   . Asthma   . Common migraine with intractable migraine 06/28/2017  . GERD (gastroesophageal reflux disease)    pos H pylori  . IUD (intrauterine device) in place 09/03/2011   Due out in 08-18-2016  . Neonatal death    Vaginal delivery, full term-lived x2 hours.   . Palpitations   . Shortness of breath   . Spinal headache   . Valvular heart disease     Past Surgical History:  Procedure Laterality Date  . ADENOIDECTOMY     as a child  . boil  2003   right elbow  . CESAREAN SECTION     x4  . CESAREAN SECTION  06/02/2011   Procedure: CESAREAN SECTION;  Surgeon: [name], MD;  Location: [facility];  Service: Gynecology;  Laterality: N/A;  Primary Cesarean Section Delivery Baby Boy @ 0004, Apgars 9/9  . CESAREAN SECTION N/A 12/10/2013   Procedure: REPEAT CESAREAN SECTION;  Surgeon: [name], MD;  Location: [facility];  Service: Obstetrics;  Laterality: N/A;    Family History  Problem Relation Age of Onset  . Diabetes Mother   . Diabetes Father   . Diabetes Sister   . Anesthesia problems Neg Hx   . Colon cancer Neg Hx   . Esophageal cancer Neg Hx   . Rectal cancer Neg Hx   . Stomach cancer Neg Hx     Social History   Socioeconomic History  . Marital status: Married    Spouse  name: [name]  . Number of children: 4  . Years of education: Not on file  . Highest education level: Not on file  Occupational History  . Occupation: Unemployed  Social Needs  . Financial resource strain: Not on file  . Food insecurity:    Worry: Not on file    Inability: Not on file  . Transportation needs:    Medical: Not on file    Non-medical: Not on file  Tobacco Use  . Smoking status: Never Smoker  . Smokeless tobacco: Never Used  Substance and Sexual Activity  . Alcohol use: No  . Drug use: No  . Sexual activity: Yes    Birth control/protection: None    Comment: pregnant  Lifestyle  . Physical activity:    Days per week: Not on file    Minutes per session: Not on file  . Stress: Not on file  Relationships  . Social connections:    Talks on phone: Not on file    Gets together: Not on file    Attends religious service: Not on file    Active member of club or organization: Not on file    Attends meetings of clubs or organizations: Not on file  Relationship status: Not on file  . Intimate partner violence:    Fear of current or ex partner: Not on file    Emotionally abused: Not on file    Physically abused: Not on file    Forced sexual activity: Not on file  Other Topics Concern  . Not on file  Social History Narrative   Lives with husband and child   Caffeine use: daily (tea), sometimes coffee/soda   Right handed     Outpatient Medications Prior to Visit  Medication Sig Dispense Refill  . acetaminophen (TYLENOL) 500 MG tablet Take 1 tablet (500 mg total) by mouth every 6 (six) hours as needed. 30 tablet 0  . Blood Pressure Monitoring DEVI 1 each by Does not apply route daily. 1 Device 0  . etonogestrel (NEXPLANON) 68 MG IMPL implant 1 each by Subdermal route once.    . FEROSUL 325 (65 Fe) MG tablet TAKE 1 TABLET (325 MG TOTAL) BY MOUTH DAILY. 30 tablet 3  . fluticasone (FLONASE) 50 MCG/ACT nasal spray Place 2 sprays into both nostrils daily. 16 g 6   . fluticasone furoate-vilanterol (BREO ELLIPTA) 100-25 MCG/INH AEPB Inhale 1 puff into the lungs daily. 84 each 3  . gabapentin (NEURONTIN) 300 MG capsule Take 1 capsule (300 mg total) by mouth at bedtime. 30 capsule 3  . hydrOXYzine (ATARAX/VISTARIL) 10 MG tablet Take 1 tablet (10 mg total) by mouth 3 (three) times daily as needed. 30 tablet 2  . ipratropium (ATROVENT) 0.06 % nasal spray Place 2 sprays into both nostrils as needed for rhinitis.    Marland Kitchen levocetirizine (XYZAL) 5 MG tablet Take 1 tablet (5 mg total) by mouth every evening. 30 tablet 2  . metoprolol tartrate (LOPRESSOR) 25 MG tablet Take 1.5 tablets (37.5 mg total) by mouth 2 (two) times daily. 270 tablet 3  . pantoprazole (PROTONIX) 40 MG tablet Take 1 tablet (40 mg total) by mouth 2 (two) times daily before a meal. 180 tablet 1  . potassium chloride SA (K-DUR,KLOR-CON) 20 MEQ tablet TAKE 1 TABLET (20 MEQ TOTAL) BY MOUTH DAILY. 30 tablet 1  . sucralfate (CARAFATE) 1 GM/10ML suspension Take 10 mLs (1 g total) by mouth every 6 (six) hours as needed for up to 14 days. 420 mL 1   No facility-administered medications prior to visit.     No Known Allergies  Review of Systems  Constitutional: Negative.  Negative for chills and fever.  HENT: Positive for congestion, sinus pain and sore throat.   Eyes: Negative.   Respiratory: Positive for cough and sputum production.   Cardiovascular: Negative.   Gastrointestinal: Negative.   Genitourinary: Negative.   Musculoskeletal: Negative.   Skin: Negative.   Neurological: Negative.   Psychiatric/Behavioral: Negative.        Objective:    Physical Exam  Constitutional: She is oriented to person, place, and time. She appears well-developed and well-nourished. No distress.  HENT:  Head: Normocephalic and atraumatic.  Eyes: Pupils are equal, round, and reactive to light. Conjunctivae and EOM are normal.  Neck: Normal range of motion.  Cardiovascular: Normal rate, regular rhythm and  normal heart sounds.  Pulmonary/Chest: Effort normal. No respiratory distress. She has wheezes.  Musculoskeletal: Normal range of motion.  Neurological: She is alert and oriented to person, place, and time.  Skin: Skin is warm and dry.  Psychiatric: She has a normal mood and affect. Her behavior is normal. Judgment and thought content normal.  Nursing note and vitals reviewed.   BP  122/66 (BP Location: Left Arm, Patient Position: Sitting, Cuff Size: Normal)   Pulse (!) 111   Temp 99.3 F (37.4 C) (Oral)   Resp 16   Ht 4\' 10"  (1.473 m)   Wt 160 lb (72.6 kg)   SpO2 99%   BMI 33.44 kg/m  Wt Readings from Last 3 Encounters:  04/14/18 160 lb (72.6 kg)  04/04/18 166 lb (75.3 kg)  03/02/18 162 lb (73.5 kg)    Health Maintenance Due  Topic Date Due  . PAP SMEAR-Modifier  06/19/2016    There are no preventive care reminders to display for this patient.   Lab Results  Component Value Date   TSH 1.577 04/26/2017   Lab Results  Component Value Date   WBC 10.3 01/16/2018   HGB 12.2 01/16/2018   HCT 37.9 01/16/2018   MCV 88.1 01/16/2018   PLT 362 01/16/2018   Lab Results  Component Value Date   NA 138 01/16/2018   K 4.5 01/20/2018   CO2 25 01/16/2018   GLUCOSE 190 (H) 01/16/2018   BUN 10 02/27/2018   CREATININE 0.63 02/27/2018   BILITOT 0.7 01/16/2018   ALKPHOS 66 01/16/2018   AST 21 01/16/2018   ALT 13 01/16/2018   PROT 7.4 01/16/2018   ALBUMIN 3.8 01/16/2018   CALCIUM 9.3 01/16/2018   ANIONGAP 12 01/16/2018   Lab Results  Component Value Date   CHOL 165 10/08/2014   Lab Results  Component Value Date   HDL 30 (L) 10/08/2014   Lab Results  Component Value Date   LDLCALC 114 (H) 10/08/2014   Lab Results  Component Value Date   TRIG 107 10/08/2014   Lab Results  Component Value Date   CHOLHDL 5.5 10/08/2014   Lab Results  Component Value Date   HGBA1C 5.7 12/01/2016       Assessment & Plan:   Problem List Items Addressed This Visit    None     Visit Diagnoses    Bronchitis    -  Primary   Relevant Medications   promethazine-dextromethorphan (PROMETHAZINE-DM) 6.25-15 MG/5ML syrup   albuterol (PROVENTIL) (2.5 MG/3ML) 0.083% nebulizer solution 2.5 mg (Completed)   ipratropium (ATROVENT) nebulizer solution 0.5 mg (Completed)   albuterol (PROVENTIL HFA;VENTOLIN HFA) 108 (90 Base) MCG/ACT inhaler   Acute non-recurrent frontal sinusitis       Relevant Medications   amoxicillin-clavulanate (AUGMENTIN) 875-125 MG tablet   methylPREDNISolone (MEDROL DOSEPAK) 4 MG TBPK tablet   promethazine-dextromethorphan (PROMETHAZINE-DM) 6.25-15 MG/5ML syrup   albuterol (PROVENTIL HFA;VENTOLIN HFA) 108 (90 Base) MCG/ACT inhaler   Other Relevant Orders   POC Influenza A&B (Binax test) (Completed)       Meds ordered this encounter  Medications  . amoxicillin-clavulanate (AUGMENTIN) 875-125 MG tablet    Sig: Take 1 tablet by mouth every 12 (twelve) hours for 7 days.    Dispense:  14 tablet    Refill:  0  . methylPREDNISolone (MEDROL DOSEPAK) 4 MG TBPK tablet    Sig: Take as directed    Dispense:  21 tablet    Refill:  0  . promethazine-dextromethorphan (PROMETHAZINE-DM) 6.25-15 MG/5ML syrup    Sig: Take 5 mLs by mouth 4 (four) times daily as needed for cough.    Dispense:  118 mL    Refill:  0  . albuterol (PROVENTIL) (2.5 MG/3ML) 0.083% nebulizer solution 2.5 mg  . ipratropium (ATROVENT) nebulizer solution 0.5 mg  . albuterol (PROVENTIL HFA;VENTOLIN HFA) 108 (90 Base) MCG/ACT inhaler    Sig:  Inhale 2 puffs into the lungs every 6 (six) hours as needed for wheezing or shortness of breath.    Dispense:  1 Inhaler    Refill:  [place], FNP

## 2018-04-20 ENCOUNTER — Encounter: Payer: Self-pay | Admitting: Gastroenterology

## 2018-04-20 ENCOUNTER — Ambulatory Visit (INDEPENDENT_AMBULATORY_CARE_PROVIDER_SITE_OTHER): Payer: Self-pay | Admitting: Gastroenterology

## 2018-04-20 VITALS — BP 110/76 | HR 68 | Ht <= 58 in | Wt 162.0 lb

## 2018-04-20 DIAGNOSIS — R194 Change in bowel habit: Secondary | ICD-10-CM

## 2018-04-20 DIAGNOSIS — R109 Unspecified abdominal pain: Secondary | ICD-10-CM

## 2018-04-20 DIAGNOSIS — K219 Gastro-esophageal reflux disease without esophagitis: Secondary | ICD-10-CM

## 2018-04-20 NOTE — Patient Instructions (Signed)
If you are age 41 or older, your body mass index should be between 23-30. Your Body mass index is 33.86 kg/m. If this is out of the aforementioned range listed, please consider follow up with your Primary Care Provider.  If you are age 41 or younger, your body mass index should be between 19-25. Your Body mass index is 33.86 kg/m. If this is out of the aformentioned range listed, please consider follow up with your Primary Care Provider.   Increase your gabapentin to twice a day.  Continue your Protonix. Continue Tylenol as needed.  Use over the counter muscle rub for musculoskeletal abdominal pain (such as Bengay or Biofreeze)  Thank you for entrusting me with your care and for choosing [facility], Dr. [place] Cellar

## 2018-04-20 NOTE — Progress Notes (Signed)
HPI :  43 y/o female here for a follow up visit. She speaks Arabic and is accompanied by a Optometrist today. She has been evaluated in the past for GERD, and abdominal pain.  She has a history of H. Pylori as well as GERD. Previously had an H. Pylori IgG serology in 2022/09/05 and was treated for that. He had recurrence of symptoms after treatment, was placed on Protonix and scheduled for an endoscopy with me in September. She had an EGD on September 25. She had some mild gastritis noted, unfortunately her procedure was aborted due to significant laryngospasm and oxygen desaturation. Esophagus was not well evaluated but a limited exam did not appear to have any significant abnormalities. Unfortunately given the procedure was aborted we cannot take biopsies to confirm H. Pylori eradication. We stopped her Protonix and she submitted a stool study which was negative for H. Pylori. She's also had ongoing intermittent chest pains and has seen cardiology. Had prior EST which was normal. She has a cardiac CT pending, to be done in the next few weeks.   At the last visit we added Carafate as needed to protonix 40mg  BID. It seems her reflux is well controlled at this time. She had a CT scan for her abdominal pain on 03/16/18 which was normal, and an US of the gallbladder on 01/2014 remotely for pain which showed no gallstones. She continues to have some abdominal pain which is bothering her. This appears to be somewhat around the umbilicus to superior to the umbilicus sometimes to the bilateral sides. It comes and goes but she feels it daily at least once or twice a day. She describes it as a sharp and "burning discomfort". Symptoms can last up to an hour at a time. She denies any relation to eating. When she lies on her side she feels that it better, so there is a positional component. It has been ongoing for a very long time. She rates it at 9 out of 10 when she gets it. She has been placed on gabapentin 300 mg at night  for another issue (migraines), it may have provided some benefit for this issue.  Of note, she has been dealing with a cough recently. She states at one point if she coughed extremely hard it could leak to some leakage of stools. This has not persisted and seems to be improving over time,  Prior workup: CT abdomen pelvis 03/16/18 - normal [place] 01/15/14 - gallbladder normal EGD 01/04/18 - significant desaturation due to laryngospasm and procedure was aborted, mild gastriits, esophagus not well evaluated     Past Medical History:  Diagnosis Date  . Anemia   . Asthma   . Common migraine with intractable migraine 06/28/2017  . GERD (gastroesophageal reflux disease)    pos H pylori  . IUD (intrauterine device) in place 09/03/2011   Due out in 09/04/16  . Neonatal death    Vaginal delivery, full term-lived x2 hours.   . Palpitations   . Shortness of breath   . Spinal headache   . Valvular heart disease      Past Surgical History:  Procedure Laterality Date  . ADENOIDECTOMY     as a child  . boil  2003   right elbow  . CESAREAN SECTION     x4  . CESAREAN SECTION  06/02/2011   Procedure: CESAREAN SECTION;  Surgeon: [name], MD;  Location: [facility];  Service: Gynecology;  Laterality: N/A;  Primary Cesarean Section  Delivery Baby Boy @ 0004, Apgars 9/9  . CESAREAN SECTION N/A 12/10/2013   Procedure: REPEAT CESAREAN SECTION;  Surgeon: [name], MD;  Location: [facility];  Service: Obstetrics;  Laterality: N/A;   Family History  Problem Relation Age of Onset  . Diabetes Mother   . Diabetes Father   . Diabetes Sister   . Anesthesia problems Neg Hx   . Colon cancer Neg Hx   . Esophageal cancer Neg Hx   . Rectal cancer Neg Hx   . Stomach cancer Neg Hx    Social History   Tobacco Use  . Smoking status: Never Smoker  . Smokeless tobacco: Never Used  Substance Use Topics  . Alcohol use: No  . Drug use: No   Current Outpatient Medications  Medication Sig Dispense Refill  .  acetaminophen (TYLENOL) 500 MG tablet Take 1 tablet (500 mg total) by mouth every 6 (six) hours as needed. 30 tablet 0  . albuterol (PROVENTIL HFA;VENTOLIN HFA) 108 (90 Base) MCG/ACT inhaler Inhale 2 puffs into the lungs every 6 (six) hours as needed for wheezing or shortness of breath. 1 Inhaler 2  . amoxicillin-clavulanate (AUGMENTIN) 875-125 MG tablet Take 1 tablet by mouth every 12 (twelve) hours for 7 days. 14 tablet 0  . Blood Pressure Monitoring DEVI 1 each by Does not apply route daily. 1 Device 0  . ergocalciferol (VITAMIN D2) 1.25 MG (50000 UT) capsule Take 50,000 Units by mouth once a week.    . etonogestrel (NEXPLANON) 68 MG IMPL implant 1 each by Subdermal route once.    . FEROSUL 325 (65 Fe) MG tablet TAKE 1 TABLET (325 MG TOTAL) BY MOUTH DAILY. 30 tablet 3  . fluticasone (FLONASE) 50 MCG/ACT nasal spray Place 2 sprays into both nostrils daily. 16 g 6  . fluticasone furoate-vilanterol (BREO ELLIPTA) 100-25 MCG/INH AEPB Inhale 1 puff into the lungs daily. 84 each 3  . gabapentin (NEURONTIN) 300 MG capsule Take 1 capsule (300 mg total) by mouth at bedtime. 30 capsule 3  . hydrOXYzine (ATARAX/VISTARIL) 10 MG tablet Take 1 tablet (10 mg total) by mouth 3 (three) times daily as needed. 30 tablet 2  . ipratropium (ATROVENT) 0.06 % nasal spray Place 2 sprays into both nostrils as needed for rhinitis.    Marland Kitchen levocetirizine (XYZAL) 5 MG tablet Take 1 tablet (5 mg total) by mouth every evening. 30 tablet 2  . methylPREDNISolone (MEDROL DOSEPAK) 4 MG TBPK tablet Take as directed 21 tablet 0  . metoprolol tartrate (LOPRESSOR) 25 MG tablet Take 1.5 tablets (37.5 mg total) by mouth 2 (two) times daily. 270 tablet 3  . pantoprazole (PROTONIX) 40 MG tablet Take 1 tablet (40 mg total) by mouth 2 (two) times daily before a meal. 180 tablet 1  . potassium chloride SA (K-DUR,KLOR-CON) 20 MEQ tablet TAKE 1 TABLET (20 MEQ TOTAL) BY MOUTH DAILY. 30 tablet 1  . promethazine-dextromethorphan (PROMETHAZINE-DM)  6.25-15 MG/5ML syrup Take 5 mLs by mouth 4 (four) times daily as needed for cough. 118 mL 0  . sucralfate (CARAFATE) 1 GM/10ML suspension Take 10 mLs (1 g total) by mouth every 6 (six) hours as needed for up to 14 days. 420 mL 1   No current facility-administered medications for this visit.    No Known Allergies   Review of Systems: All systems reviewed and negative except where noted in HPI.   Lab Results  Component Value Date   WBC 10.3 01/16/2018   HGB 12.2 01/16/2018   HCT 37.9  01/16/2018   MCV 88.1 01/16/2018   PLT 362 01/16/2018    Lab Results  Component Value Date   CREATININE 0.63 02/27/2018   BUN 10 02/27/2018   NA 138 01/16/2018   K 4.5 01/20/2018   CL 101 01/16/2018   CO2 25 01/16/2018    Lab Results  Component Value Date   ALT 13 01/16/2018   AST 21 01/16/2018   ALKPHOS 66 01/16/2018   BILITOT 0.7 01/16/2018     Physical Exam: BP 110/76   Pulse 68   Ht 4\' 10"  (1.473 m)   Wt 162 lb (73.5 kg)   BMI 33.86 kg/m  Constitutional: Pleasant,well-developed, female in no acute distress. HEENT: Normocephalic and atraumatic. Conjunctivae are normal. No scleral icterus. Neck supple.  Cardiovascular: Normal rate, regular rhythm.  Pulmonary/chest: Effort normal and breath sounds normal. No wheezing, rales or rhonchi. Abdominal: Soft, nondistended, mild mid abdominal TTP. Marland Kitchen There are no masses palpable. No hepatomegaly. Extremities: no edema Lymphadenopathy: No cervical adenopathy noted. Neurological: Alert and oriented to person place and time. Skin: Skin is warm and dry. No rashes noted. Psychiatric: Normal mood and affect. Behavior is normal.   ASSESSMENT AND PLAN: 43 year old female here for reassessment of the following issues:  Abdominal pain - EGD and CT scan without any clear pathology. Her reflux is controlled on high-dose antacid. H. Pylori testing negative. She did not have gallstones on remote ultrasound. From her description of symptoms and exam,  I suspect that this point she may have musculoskeletal or neuropathic pain. I discussed what this was with her. I recommend increasing her gabapentin to twice daily to see if that helps. She can continue to take Tylenol as needed which has been helping her, and have a trial of a topical muscle rub to see if that helps. I provided reassurance that I don't think are missing anything concerning at this point given her workup to date. If symptoms persist over time she can call me back for discussion of other options.  GERD - appears to be better controlled on higher dose Protonix, and Carafate as needed. We'll continue that for now. She is awaiting cardiac CT for further workup of chest discomfort.  Change in bowels - severe coughing led to some episodes of incontinence. Overall she appears to be improving from this. If it persists or does not improve as her cough gets better than she can contact us for reassessment, would recommend pelvic floor PT.  Chocowinity Cellar, MD [facility]

## 2018-04-25 ENCOUNTER — Telehealth: Payer: Self-pay | Admitting: Family Medicine

## 2018-04-25 NOTE — Telephone Encounter (Signed)
LVM, Spoke with husband and he will pick her tomoro at [facility]

## 2018-04-25 NOTE — Telephone Encounter (Signed)
Pt called requesting her CAFA Discount, please follow up

## 2018-04-27 ENCOUNTER — Telehealth: Payer: Self-pay | Admitting: Gastroenterology

## 2018-04-27 MED ORDER — NORTRIPTYLINE HCL 10 MG PO CAPS: ORAL_CAPSULE | ORAL | 0 refills | Status: DC

## 2018-04-27 MED FILL — NORTRIPTYLINE HCL 10 MG CAP: 10 | 28 days supply | Qty: 42 | Fill #0

## 2018-04-27 NOTE — Telephone Encounter (Signed)
Sorry to hear this. I think she may be having musculoskeletal pain based on my last clinic visit. Negative CT, EGD, [place]. I had recommended topical muscle rub to the abdominal wall and increasing gabapentin, not sure if she had done that. She should do that if not done. If already done and this persists, recommend a trial of TCA. Please let her know this medication is normally used for anxiety / depression, but we also use it as a pain medication. Would try nortriptyline 10mg  q HS for 2 weeks, then increase to 2 tabs q HS if she tolerates. Can you let me know?  Thanks

## 2018-04-27 NOTE — Telephone Encounter (Signed)
Pt call and advised that she is still having terrible stomach pains, excessive saliva, she cant eat nor sleep. She is wanting to know is there something that can be prescribe to help her with  The continue symptoms since offc visit last thru.

## 2018-04-27 NOTE — Telephone Encounter (Signed)
Spoke with pt and she states she has tried the previous recommendations. She will try the nortriptyline. Script sent to pharmacy. Pt will call back with an update.

## 2018-04-27 NOTE — Telephone Encounter (Signed)
Please see note below and advise  

## 2018-04-28 ENCOUNTER — Telehealth: Payer: Self-pay | Admitting: Neurology

## 2018-04-28 ENCOUNTER — Telehealth (HOSPITAL_COMMUNITY): Payer: Self-pay | Admitting: Emergency Medicine

## 2018-04-28 ENCOUNTER — Ambulatory Visit
Admission: RE | Admit: 2018-04-28 | Discharge: 2018-04-28 | Disposition: A | Discharge status: Home / Self Care | Payer: No Typology Code available for payment source | Source: Ambulatory Visit | Attending: Neurology | Admitting: Neurology

## 2018-04-28 ENCOUNTER — Ambulatory Visit (INDEPENDENT_AMBULATORY_CARE_PROVIDER_SITE_OTHER): Payer: Self-pay | Admitting: Neurology

## 2018-04-28 ENCOUNTER — Encounter: Payer: Self-pay | Admitting: Neurology

## 2018-04-28 VITALS — BP 117/71 | HR 92 | Ht <= 58 in | Wt 164.0 lb

## 2018-04-28 DIAGNOSIS — G8929 Other chronic pain: Secondary | ICD-10-CM

## 2018-04-28 DIAGNOSIS — Z5181 Encounter for therapeutic drug level monitoring: Secondary | ICD-10-CM

## 2018-04-28 DIAGNOSIS — M5442 Lumbago with sciatica, left side: Principal | ICD-10-CM

## 2018-04-28 DIAGNOSIS — M5441 Lumbago with sciatica, right side: Principal | ICD-10-CM

## 2018-04-28 DIAGNOSIS — G43019 Migraine without aura, intractable, without status migrainosus: Secondary | ICD-10-CM

## 2018-04-28 NOTE — Telephone Encounter (Signed)
Reaching out to patient to offer assistance regarding upcoming cardiac imaging study; pt verbalizes understanding of appt date/time, parking situation and where to check in, pre-test NPO status and medications ordered, and verified current allergies; name and call back number provided for further questions should they arise Betty Reichardt RN Navigator Cardiac Imaging [facility] [phone] office [phone] cell 

## 2018-04-28 NOTE — Progress Notes (Signed)
Reason for visit: Headache, numbness, low back pain  Betty Cain is an 43 y.o. female  History of present illness:  Betty Cain is a 43 year old right-handed female with a history of migratory sensory complaints, the patient initially was seen on 28 June 2017.  MRI of the brain was done and was unremarkable, the patient was noted to have an extremely elevated sedimentation rate, the etiology of this is not clear.  The patient does have a chronic cough, she has been treated for chronic bronchitis.  The patient reports episodes of chills and sweats, but no fevers.  The patient has been placed on gabapentin taking 300 mg in the evening, she is not able to take higher doses because of drowsiness.  She comes in today complaining of episodic migraine headaches associated with some flashing lights with the vision, she is having on average one headache a week that may last 24 to 48 hours.  The patient may have dizziness with the headache, but may have dizziness at other times as well.  She reports low back pain and pain down both legs, right greater than left.  She has been to see a gastroenterologist for abdominal cramping, she was just placed on nortriptyline to work her up to 20 mg at night, the medication was started yesterday.  The patient has chronic fatigue issues, she has had a sleep evaluation in the past and was told that this did not show sleep apnea.  The patient does snore at night.  The patient has underlying anxiety issues well.  She reports that just within the last week that she has started to have some numbness in her feet once again, the numbness had disappeared for a number of months.  The patient has not had any weight loss, she has gained weight.  She denies any significant balance issues, she does feel that the legs are somewhat weak.  She comes to this office for an evaluation.  Past Medical History:  Diagnosis Date  . Anemia   . Asthma   . Common migraine with intractable migraine  06/28/2017  . GERD (gastroesophageal reflux disease)    pos H pylori  . IUD (intrauterine device) in place 09/03/2011   Due out in 08-22-2016  . Neonatal death    Vaginal delivery, full term-lived x2 hours.   . Palpitations   . Shortness of breath   . Spinal headache   . Valvular heart disease     Past Surgical History:  Procedure Laterality Date  . ADENOIDECTOMY     as a child  . boil  2003   right elbow  . CESAREAN SECTION     x4  . CESAREAN SECTION  06/02/2011   Procedure: CESAREAN SECTION;  Surgeon: [name], MD;  Location: [facility];  Service: Gynecology;  Laterality: N/A;  Primary Cesarean Section Delivery Baby Boy @ 0004, Apgars 9/9  . CESAREAN SECTION N/A 12/10/2013   Procedure: REPEAT CESAREAN SECTION;  Surgeon: [name], MD;  Location: [facility];  Service: Obstetrics;  Laterality: N/A;    Family History  Problem Relation Age of Onset  . Diabetes Mother   . Diabetes Father   . Diabetes Sister   . Anesthesia problems Neg Hx   . Colon cancer Neg Hx   . Esophageal cancer Neg Hx   . Rectal cancer Neg Hx   . Stomach cancer Neg Hx     Social history:  reports that she has never smoked. She has never used  smokeless tobacco. She reports that she does not drink alcohol or use drugs.   No Known Allergies  Medications:  Prior to Admission medications   Medication Sig Start Date End Date Taking? Authorizing Provider  acetaminophen (TYLENOL) 500 MG tablet Take 1 tablet (500 mg total) by mouth every 6 (six) hours as needed. 01/21/15  Yes Dorena Dew, FNP  albuterol (PROVENTIL HFA;VENTOLIN HFA) 108 (90 Base) MCG/ACT inhaler Inhale 2 puffs into the lungs every 6 (six) hours as needed for wheezing or shortness of breath. 04/14/18  Yes Lanae Boast, FNP  Blood Pressure Monitoring DEVI 1 each by Does not apply route daily. 02/01/18  Yes Lanae Boast, FNP  Cholecalciferol (DIALYVITE VITAMIN D 5000 PO) Take by mouth. Weekly   Yes [provider]  ergocalciferol  (VITAMIN D2) 1.25 MG (50000 UT) capsule Take 50,000 Units by mouth once a week.   Yes [provider]  etonogestrel (NEXPLANON) 68 MG IMPL implant 1 each by Subdermal route once.   Yes [provider]  FEROSUL 325 (65 Fe) MG tablet TAKE 1 TABLET (325 MG TOTAL) BY MOUTH DAILY. 02/23/18  Yes Lanae Boast, FNP  fluticasone (FLONASE) 50 MCG/ACT nasal spray Place 2 sprays into both nostrils daily. 08/01/17  Yes Dorena Dew, FNP  fluticasone furoate-vilanterol (BREO ELLIPTA) 100-25 MCG/INH AEPB Inhale 1 puff into the lungs daily. 02/13/18  Yes Tresa Garter, MD  gabapentin (NEURONTIN) 300 MG capsule Take 1 capsule (300 mg total) by mouth at bedtime. Patient taking differently: Take 300 mg by mouth 2 (two) times daily.  02/01/18  Yes Lanae Boast, FNP  hydrOXYzine (ATARAX/VISTARIL) 10 MG tablet Take 1 tablet (10 mg total) by mouth 3 (three) times daily as needed. 03/02/18  Yes Lanae Boast, FNP  ipratropium (ATROVENT) 0.06 % nasal spray Place 2 sprays into both nostrils as needed for rhinitis.   Yes [provider]  levocetirizine (XYZAL) 5 MG tablet Take 1 tablet (5 mg total) by mouth every evening. 03/02/18  Yes Lanae Boast, FNP  methylPREDNISolone (MEDROL DOSEPAK) 4 MG TBPK tablet Take as directed 04/14/18  Yes Lanae Boast, FNP  metoprolol tartrate (LOPRESSOR) 25 MG tablet Take 1.5 tablets (37.5 mg total) by mouth 2 (two) times daily. 04/10/18  Yes Burtis Junes, NP  nortriptyline (PAMELOR) 10 MG capsule Take one by mouth at bedtime for 2 weeks, then take 2 by mouth at bedtime. 04/27/18  Yes Armbruster, Carlota Raspberry, MD  pantoprazole (PROTONIX) 40 MG tablet Take 1 tablet (40 mg total) by mouth 2 (two) times daily before a meal. 02/27/18  Yes Armbruster, Carlota Raspberry, MD  potassium chloride SA (K-DUR,KLOR-CON) 20 MEQ tablet TAKE 1 TABLET (20 MEQ TOTAL) BY MOUTH DAILY. 03/29/18  Yes Lanae Boast, FNP  promethazine-dextromethorphan (PROMETHAZINE-DM) 6.25-15 MG/5ML  syrup Take 5 mLs by mouth 4 (four) times daily as needed for cough. 04/14/18  Yes Lanae Boast, FNP  sucralfate (CARAFATE) 1 GM/10ML suspension Take 10 mLs (1 g total) by mouth every 6 (six) hours as needed for up to 14 days. 02/27/18 03/13/18  Armbruster, Carlota Raspberry, MD    ROS:  Out of a complete 14 system review of symptoms, the patient complains only of the following symptoms, and all other reviewed systems are negative.  Decreased activity, increased appetite, chills, fever, fatigue, excessive sweating Ringing in the ears Eye pain Cough Palpitations of the heart Excessive thirst Black stools Joint pain Dizziness, headache  Blood pressure 117/71, pulse 92, height 4\' 10"  (1.473 m), weight 164 lb (  74.4 kg).  Physical Exam  General: The patient is alert and cooperative at the time of the examination.  The patient is moderately to markedly obese.  Neuromuscular: The patient is able to flex with a low back fairly normally.  Skin: No significant peripheral edema is noted.   Neurologic Exam  Mental status: The patient is alert and oriented x 3 at the time of the examination. The patient has apparent normal recent and remote memory, with an apparently normal attention span and concentration ability.   Cranial nerves: Facial symmetry is present. Speech is normal, no aphasia or dysarthria is noted. Extraocular movements are full. Visual fields are full.  Motor: The patient has good strength in all 4 extremities.  Sensory examination: Soft touch sensation is symmetric on the face, arms, and legs.  Coordination: The patient has good finger-nose-finger and heel-to-shin bilaterally.  Gait and station: The patient has a normal gait. Tandem gait is normal. Romberg is negative. No drift is seen.  Reflexes: Deep tendon reflexes are symmetric.   Assessment/Plan:  1.  Classic migraine headache  2.  Sensory complaints, foot numbness  3.  Chronic fatigue  4.  Low back pain, leg  pain  The patient appears to have multiple somatic complaints, she has had documented significant elevations in her sedimentation rate, connective tissue work-up and vasculitic work-up was negative.  The patient was sent for another sedimentation rate, we will get a QuantiFERON gold study.  The patient has just been placed on nortriptyline, this may help her back pain and her migraine headaches.  If she does not get good benefit with the next 6 to 8 weeks, we may consider a medication such as Aimovig or Ajovy for her headaches.  The patient will have an x-ray of the low back.  She will remain on gabapentin for now.  She will follow-up in 4 months.  Jill Alexanders MD 04/28/2018 10:17 AM  [facility] [address] [place], [place] [address]  Phone [phone] Fax [phone]

## 2018-04-28 NOTE — Patient Instructions (Signed)
We will get an X-ray of the low back, call our office if the headache is no better in the next 6 weeks after starting the nortriptyline.

## 2018-04-28 NOTE — Telephone Encounter (Signed)
I called the patient.  X-ray of the back is relatively unremarkable, mild scoliosis, no significant arthritis.  The patient has just started nortriptyline, if the pain continues, we may consider other measures such as neuromuscular therapy.   XR lumbar 04/28/18:  IMPRESSION: Slight scoliosis. No fracture or spondylolisthesis. No appreciable arthropathy.

## 2018-05-01 ENCOUNTER — Other Ambulatory Visit: Payer: Self-pay

## 2018-05-01 DIAGNOSIS — J3089 Other allergic rhinitis: Secondary | ICD-10-CM

## 2018-05-01 MED ORDER — FLUTICASONE PROPIONATE 50 MCG/ACT NA SUSP: 2.0000 | Freq: Every day | NASAL | 6 refills | Status: DC

## 2018-05-01 MED FILL — LEVOCETIRIZINE 5 MG TABLET: 5 | 30 days supply | Qty: 30 | Fill #2

## 2018-05-01 MED FILL — GABAPENTIN 300 MG CAPSULE: 300 | 30 days supply | Qty: 30 | Fill #3

## 2018-05-01 MED FILL — ?PANTOPRAZOLE SO DR 40MG TA: 40 | 30 days supply | Qty: 60 | Fill #2

## 2018-05-01 MED FILL — CARAFATE 1 GM/10 ML SUSP: 1 | 16 days supply | Qty: 420 | Fill #1

## 2018-05-01 MED FILL — VIT D2 1.25 MG (50,000 UNIT: 1.25 MG | 28 days supply | Qty: 4 | Fill #3

## 2018-05-01 MED FILL — POTASSIUM CL ER 20 MEQ TAB: 20 | 30 days supply | Qty: 30 | Fill #1

## 2018-05-01 MED FILL — hydrOXYzine HCL 10 MG TABS: 10 | 10 days supply | Qty: 30 | Fill #1

## 2018-05-02 ENCOUNTER — Other Ambulatory Visit: Payer: Self-pay | Admitting: *Deleted

## 2018-05-02 ENCOUNTER — Encounter (HOSPITAL_COMMUNITY): Payer: Self-pay

## 2018-05-02 ENCOUNTER — Ambulatory Visit (HOSPITAL_COMMUNITY)
Admission: RE | Admit: 2018-05-02 | Discharge: 2018-05-02 | Disposition: A | Discharge status: Home / Self Care | Payer: No Typology Code available for payment source | Source: Ambulatory Visit | Attending: Nurse Practitioner | Admitting: Nurse Practitioner

## 2018-05-02 ENCOUNTER — Ambulatory Visit (HOSPITAL_COMMUNITY): Payer: No Typology Code available for payment source

## 2018-05-02 ENCOUNTER — Telehealth: Payer: Self-pay | Admitting: *Deleted

## 2018-05-02 ENCOUNTER — Telehealth (HOSPITAL_COMMUNITY): Payer: Self-pay | Admitting: *Deleted

## 2018-05-02 DIAGNOSIS — R002 Palpitations: Secondary | ICD-10-CM

## 2018-05-02 DIAGNOSIS — R9431 Abnormal electrocardiogram [ECG] [EKG]: Secondary | ICD-10-CM | POA: Insufficient documentation

## 2018-05-02 DIAGNOSIS — R079 Chest pain, unspecified: Secondary | ICD-10-CM

## 2018-05-02 LAB — QUANTIFERON-TB GOLD PLUS
QuantiFERON Mitogen Value: >10 IU/mL
QuantiFERON Nil Value: 0.05 IU/mL
QuantiFERON TB1 Ag Value: 0.06 IU/mL
QuantiFERON TB2 Ag Value: 0.05 IU/mL
QuantiFERON-TB Gold Plus: NEGATIVE

## 2018-05-02 LAB — C-REACTIVE PROTEIN: CRP: 48 mg/L — ABNORMAL HIGH (ref 0–10)

## 2018-05-02 LAB — SEDIMENTATION RATE: Sed Rate: 84 mm/hr — ABNORMAL HIGH (ref 0–32)

## 2018-05-02 MED ORDER — METOPROLOL TARTRATE 5 MG/5ML IV SOLN
INTRAVENOUS | Status: AC
  Filled 2018-05-02: qty 10

## 2018-05-02 MED ORDER — METOPROLOL TARTRATE 5 MG/5ML IV SOLN
INTRAVENOUS | Status: AC
  Filled 2018-05-02: qty 5

## 2018-05-02 MED ORDER — METOPROLOL TARTRATE 5 MG/5ML IV SOLN
5.0000 mg | INTRAVENOUS | Status: DC | PRN
  Administered 2018-05-02 (×2): 5 mg via INTRAVENOUS
  Filled 2018-05-02 (×3): qty 5

## 2018-05-02 MED ORDER — NITROGLYCERIN 0.4 MG SL SUBL
0.8000 mg | SUBLINGUAL_TABLET | Freq: Once | SUBLINGUAL | Status: DC
  Filled 2018-05-02: qty 25

## 2018-05-02 NOTE — Telephone Encounter (Signed)
Patient given detailed instructions per phone interpreter Nejla per Myocardial Perfusion Study Information Sheet for the test on 05/05/18 at 1015. Patient notified to arrive 15 minutes early and that it is imperative to arrive on time for appointment to keep from having the test rescheduled.  If you need to cancel or reschedule your appointment, please call the office within 24 hours of your appointment. . Patient verbalized understanding.Divine Hansley, Ranae Palms

## 2018-05-02 NOTE — Telephone Encounter (Signed)
-----   Message from Burtis Junes, NP sent at 05/02/2018 12:00 PM EST ----- Regarding: FW: coronary CT 05/02/18 Thanks for the update Clarise Cruz - will proceed with Myoview.   Danielle,  Can you please arrange Lexiscan Myoview.   Cecille Rubin ----- Message ----- From: Lorenza Evangelist, RN Sent: 05/02/2018  11:18 AM EST To: Larey Dresser, MD, Burtis Junes, NP Subject: coronary CT 05/02/18                           Cecille Rubin, My name is Clarise Cruz and I am the new navigator for cardiac imaging. This patient came this morning for her CT scan, she followed the instructions given however with a couple doses of IV metoprolol we were unable to obtain the goal HR of 55-65bpm with a safe BP (+ nitro to be given). We consulted Dr. Aundra Dubin who was the assigned reader, who suggested a cardiolite instead. We told the patient that youd be reaching back out to her for further instructions.  Thanks  Marchia Bond RN Navigator Cardiac Imaging [facility] [phone] Office  [phone] Cell

## 2018-05-02 NOTE — Telephone Encounter (Signed)
S/w pt's husband per (DPR) set up a lexi.  Pt does not speak English, sister will be coming with pt the day test.  Called [facility] to let them know and sent to precert.  Went through all instructions with husband. Order placed in system and precerted.

## 2018-05-02 NOTE — Progress Notes (Signed)
Dr Aundra Dubin notified pt bp and heart rate new orders received pt given metoprolol with no change in heart rate ct navigator notified pt to be rescheduled or scheduled for different test per ordering MD

## 2018-05-03 ENCOUNTER — Ambulatory Visit (INDEPENDENT_AMBULATORY_CARE_PROVIDER_SITE_OTHER): Payer: No Typology Code available for payment source | Admitting: Family Medicine

## 2018-05-03 ENCOUNTER — Encounter: Payer: Self-pay | Admitting: Family Medicine

## 2018-05-03 VITALS — BP 112/72 | HR 90 | Temp 98.8°F | Resp 16 | Ht <= 58 in | Wt 163.0 lb

## 2018-05-03 DIAGNOSIS — Z9109 Other allergy status, other than to drugs and biological substances: Secondary | ICD-10-CM

## 2018-05-03 DIAGNOSIS — Z789 Other specified health status: Secondary | ICD-10-CM

## 2018-05-03 DIAGNOSIS — M940 Chondrocostal junction syndrome [Tietze]: Secondary | ICD-10-CM

## 2018-05-03 DIAGNOSIS — J4520 Mild intermittent asthma, uncomplicated: Secondary | ICD-10-CM

## 2018-05-03 MED ORDER — ALBUTEROL SULFATE HFA 108 (90 BASE) MCG/ACT IN AERS: 2.0000 | INHALATION_SPRAY | Freq: Four times a day (QID) | RESPIRATORY_TRACT | 2 refills | Status: DC | PRN

## 2018-05-03 MED ORDER — FLUTICASONE FUROATE-VILANTEROL 100-25 MCG/INH IN AEPB: 1.0000 | INHALATION_SPRAY | Freq: Every day | RESPIRATORY_TRACT | 3 refills | Status: DC

## 2018-05-03 MED ORDER — NAPROXEN 500 MG PO TABS: 500.0000 mg | ORAL_TABLET | Freq: Two times a day (BID) | ORAL | 0 refills | Status: AC

## 2018-05-03 MED ORDER — LORAZEPAM 0.5 MG PO TABS: 0.5000 mg | ORAL_TABLET | Freq: Once | ORAL | 0 refills | Status: AC

## 2018-05-03 MED ORDER — BENZONATATE 100 MG PO CAPS: 100.0000 mg | ORAL_CAPSULE | Freq: Two times a day (BID) | ORAL | 0 refills | Status: DC | PRN

## 2018-05-03 MED FILL — $BREO ELLIPTA 100-25 MCG IH: 100-25 MCG | 28 days supply | Qty: 28 | Fill #0

## 2018-05-03 MED FILL — BENZONATATE 100 MG CAP: 100 | 10 days supply | Qty: 20 | Fill #0

## 2018-05-03 MED FILL — NAPROXEN 500 MG TABLET: 500 | 7 days supply | Qty: 14 | Fill #0

## 2018-05-03 NOTE — Patient Instructions (Signed)
I sent one lorazepam tablet for you to take prior to your CT scan to [facility]. I sent 2 inhalers to take for asthma to [facility].     Lorazepam tablets What is this medicine? LORAZEPAM (lor A ze pam) is a benzodiazepine. It is used to treat anxiety. This medicine may be used for other purposes; ask your health care provider or pharmacist if you have questions. COMMON BRAND NAME(S): Ativan What should I tell my health care provider before I take this medicine? They need to know if you have any of these conditions: -glaucoma -history of drug or alcohol abuse problem -kidney disease -liver disease -lung or breathing disease, like asthma -mental illness -myasthenia gravis -Parkinson's disease -suicidal thoughts, plans, or attempt; a previous suicide attempt by you or a family member -an unusual or allergic reaction to lorazepam, other medicines, foods, dyes, or preservatives -pregnant or trying to get pregnant -breast-feeding How should I use this medicine? Take this medicine by mouth with a glass of water. Follow the directions on the prescription label. Take your medicine at regular intervals. Do not take it more often than directed. Do not stop taking except on your doctor's advice. A special MedGuide will be given to you by the pharmacist with each prescription and refill. Be sure to read this information carefully each time. Talk to your pediatrician regarding the use of this medicine in children. While this drug may be used in children as young as 12 years for selected conditions, precautions do apply. Overdosage: If you think you have taken too much of this medicine contact a poison control center or emergency room at once. NOTE: This medicine is only for you. Do not share this medicine with others. What if I miss a dose? If you miss a dose, take it as soon as you can. If it is almost time for your next dose, take only that dose. Do not take double or extra  doses. What may interact with this medicine? Do not take this medicine with any of the following medications: -narcotic medicines for cough -sodium oxybate This medicine may also interact with the following medications: -alcohol -antihistamines for allergy, cough and cold -certain medicines for anxiety or sleep -certain medicines for depression, like amitriptyline, fluoxetine, sertraline -certain medicines for seizures like carbamazepine, phenobarbital, phenytoin, primidone -general anesthetics like lidocaine, pramoxine, tetracaine -MAOIs like Carbex, Eldepryl, Marplan, Nardil, and Parnate -medicines that relax muscles for surgery -narcotic medicines for pain -phenothiazines like chlorpromazine, mesoridazine, prochlorperazine, thioridazine This list may not describe all possible interactions. Give your health care provider a list of all the medicines, herbs, non-prescription drugs, or dietary supplements you use. Also tell them if you smoke, drink alcohol, or use illegal drugs. Some items may interact with your medicine. What should I watch for while using this medicine? Tell your doctor or health care professional if your symptoms do not start to get better or if they get worse. Do not stop taking except on your doctor's advice. You may develop a severe reaction. Your doctor will tell you how much medicine to take. You may get drowsy or dizzy. Do not drive, use machinery, or do anything that needs mental alertness until you know how this medicine affects you. To reduce the risk of dizzy and fainting spells, do not stand or sit up quickly, especially if you are an older patient. Alcohol may increase dizziness and drowsiness. Avoid alcoholic drinks. If you are taking another medicine that also causes drowsiness, you may have  more side effects. Give your health care provider a list of all medicines you use. Your doctor will tell you how much medicine to take. Do not take more medicine than  directed. Call emergency for help if you have problems breathing or unusual sleepiness. What side effects may I notice from receiving this medicine? Side effects that you should report to your doctor or health care professional as soon as possible: -allergic reactions like skin rash, itching or hives, swelling of the face, lips, or tongue -breathing problems -confusion -loss of balance or coordination -signs and symptoms of low blood pressure like dizziness; feeling faint or lightheaded, falls; unusually weak or tired -suicidal thoughts or other mood changes Side effects that usually do not require medical attention (report to your doctor or health care professional if they continue or are bothersome): -dizziness -headache -nausea, vomiting -tiredness This list may not describe all possible side effects. Call your doctor for medical advice about side effects. You may report side effects to FDA at 1-800-FDA-1088. Where should I keep my medicine? Keep out of the reach of children. This medicine can be abused. Keep your medicine in a safe place to protect it from theft. Do not share this medicine with anyone. Selling or giving away this medicine is dangerous and against the law. This medicine may cause accidental overdose and death if taken by other adults, children, or pets. Mix any unused medicine with a substance like cat litter or coffee grounds. Then throw the medicine away in a sealed container like a sealed bag or a coffee can with a lid. Do not use the medicine after the expiration date. Store at room temperature between 20 and 25 degrees C (68 and 77 degrees F). Protect from light. Keep container tightly closed. NOTE: This sheet is a summary. It may not cover all possible information. If you have questions about this medicine, talk to your doctor, pharmacist, or health care provider.  2019 Elsevier/Gold Standard (2014-12-26 15:54:27)

## 2018-05-03 NOTE — Progress Notes (Signed)
Patient [facility]   Progress Note: General Provider: Lanae Boast, FNP  SUBJECTIVE:   Betty Cain is a 42 y.o. female who  has a past medical history of Anemia, Asthma, Common migraine with intractable migraine (06/28/2017), GERD (gastroesophageal reflux disease), IUD (intrauterine device) in place (09-30-11), Neonatal death, Palpitations, Shortness of breath, Spinal headache, and Valvular heart disease.. Patient presents today for Follow-up (2 month follow up on pain ) Since last visit patient has been to neurology, gastroenterology and cardiology.  Neurology performed lower back x-rays.  Noted to be normal.  Patient scheduled for CT.  Patient states that prior to the procedure her pulse was elevated due to her being nervous.  Will need something to help calm her prior to the procedure.  Patient continues to cough.  Not taking Brio Ellipta inhaler.  Using albuterol inhaler as needed.   Review of Systems  Constitutional: Negative.   HENT: Negative.   Eyes: Negative.   Respiratory: Negative.   Cardiovascular: Positive for chest pain (discomfort).  Gastrointestinal: Negative.   Genitourinary: Negative.   Musculoskeletal: Negative.   Skin: Negative.   Neurological: Negative.   Psychiatric/Behavioral: Negative.      OBJECTIVE: BP 112/72 (BP Location: Left Arm, Patient Position: Sitting, Cuff Size: Normal)   Pulse 90   Temp 98.8 F (37.1 C) (Oral)   Resp 16   Ht 4\' 10"  (1.473 m)   Wt 163 lb (73.9 kg)   LMP 03/28/2018   SpO2 100%   BMI 34.07 kg/m   Wt Readings from Last 3 Encounters:  05/03/18 163 lb (73.9 kg)  04/28/18 164 lb (74.4 kg)  04/20/18 162 lb (73.5 kg)     Physical Exam Vitals signs and nursing note reviewed.  Constitutional:      General: She is not in acute distress.    Appearance: She is well-developed.  HENT:     Head: Normocephalic and atraumatic.  Eyes:     Conjunctiva/sclera: Conjunctivae normal.   Pupils: Pupils are equal, round, and reactive to light.  Neck:     Musculoskeletal: Normal range of motion.  Cardiovascular:     Rate and Rhythm: Normal rate and regular rhythm.     Heart sounds: Normal heart sounds.  Pulmonary:     Effort: Pulmonary effort is normal. No respiratory distress.     Breath sounds: Normal breath sounds.  Chest:     Chest wall: Tenderness (reproducible) present.  Abdominal:     General: Bowel sounds are normal. There is no distension.     Palpations: Abdomen is soft.  Musculoskeletal: Normal range of motion.  Skin:    General: Skin is warm and dry.  Neurological:     Mental Status: She is alert and oriented to person, place, and time.  Psychiatric:        Behavior: Behavior normal.        Thought Content: Thought content normal.     ASSESSMENT/PLAN:   1. Costochondritis Discussed the etiology of this. Would like to repeat labs once this is resolved.  - naproxen (NAPROSYN) 500 MG tablet; Take 1 tablet (500 mg total) by mouth 2 (two) times daily with a meal for 7 days.  Dispense: 14 tablet; Refill: 0 - benzonatate (TESSALON) 100 MG capsule; Take 1 capsule (100 mg total) by mouth 2 (two) times daily as needed for cough.  Dispense: 20 capsule; Refill: 0  2. Environmental allergies The current medical regimen is effective;  continue present plan and  medications.   3. Mild intermittent asthma without complication Patient unaware of breo. Discussed restarting medication.  - fluticasone furoate-vilanterol (BREO ELLIPTA) 100-25 MCG/INH AEPB; Inhale 1 puff into the lungs daily.  Dispense: 84 each; Refill: 3 - albuterol (PROVENTIL HFA;VENTOLIN HFA) 108 (90 Base) MCG/ACT inhaler; Inhale 2 puffs into the lungs every 6 (six) hours as needed for wheezing or shortness of breath.  Dispense: 1 Inhaler; Refill: 2  4. Language barrier to Actuary used throughout the encounter.   Patient advised to take 0.5 mg Ativan 30 minutes to an hour prior to  CT for anxiety due to procedure. Return in about 2 months (around 07/02/2018) for Asthma.   Time Spent: 25 minutes face-to-face with this patient discussing problems, treatments, and answering patient's questions.   Interpretor used throughout Sales executive.  The patient was given clear instructions to go to ER or return to medical center if symptoms do not improve, worsen or new problems develop. The patient verbalized understanding and agreed with plan of care.   [name]. Nathaneil Canary, FNP-BC [facility] [address] [place], [place] [address] [phone]

## 2018-05-05 ENCOUNTER — Ambulatory Visit (HOSPITAL_COMMUNITY): Payer: No Typology Code available for payment source | Attending: Cardiovascular Disease

## 2018-05-05 DIAGNOSIS — R079 Chest pain, unspecified: Secondary | ICD-10-CM

## 2018-05-05 DIAGNOSIS — R002 Palpitations: Secondary | ICD-10-CM | POA: Insufficient documentation

## 2018-05-05 LAB — MYOCARDIAL PERFUSION IMAGING
LV dias vol: 46 mL (ref 46–106)
LV sys vol: 16 mL
Peak HR: 123 {beats}/min
Rest HR: 93 {beats}/min
SDS: 0
SRS: 0
SSS: 0
TID: 0.87

## 2018-05-05 MED ORDER — TECHNETIUM TC 99M TETROFOSMIN IV KIT
10.1000 | PACK | Freq: Once | INTRAVENOUS | Status: AC | PRN
  Administered 2018-05-05: 10.1 via INTRAVENOUS
  Filled 2018-05-05: qty 11

## 2018-05-05 MED ORDER — REGADENOSON 0.4 MG/5ML IV SOLN
0.4000 mg | Freq: Once | INTRAVENOUS | Status: AC
  Administered 2018-05-05: 0.4 mg via INTRAVENOUS

## 2018-05-05 MED ORDER — TECHNETIUM TC 99M TETROFOSMIN IV KIT
30.3000 | PACK | Freq: Once | INTRAVENOUS | Status: AC | PRN
  Administered 2018-05-05: 30.3 via INTRAVENOUS
  Filled 2018-05-05: qty 31

## 2018-05-08 ENCOUNTER — Encounter (HOSPITAL_COMMUNITY): Payer: No Typology Code available for payment source

## 2018-05-23 ENCOUNTER — Telehealth: Payer: Self-pay | Admitting: Neurology

## 2018-05-23 MED ORDER — ERENUMAB-AOOE 140 MG/ML ~~LOC~~ SOAJ: 140.0000 mg | SUBCUTANEOUS | 4 refills | Status: DC

## 2018-05-23 NOTE — Telephone Encounter (Signed)
The patient called and indicates that she continues to have frequent headaches.  The patient has not responded to gabapentin, nortriptyline, or Lopressor.  I will go ahead and write a prescription for Aimovig.  She will contact me if she continues to have problems.

## 2018-05-23 NOTE — Telephone Encounter (Signed)
Pt called to inform that the nortriptyline (PAMELOR) 10 MG capsule is not working for her and that she still has strong headaches. Please advise.

## 2018-05-25 MED FILL — !VENTOLIN HFA INHALER: 108 (90 BAS | 25 days supply | Qty: 18 | Fill #1

## 2018-05-25 NOTE — Telephone Encounter (Signed)
Pt has called to inform that she went to pharmacy and was told her insurance will not cover the [place].  Pt is asking for a call as to what can be done for her.

## 2018-05-26 NOTE — Telephone Encounter (Addendum)
I [facility] contacted [facility] and spoke with the pharm tech. She states the pt has the cone orange card and does not have prescription drug coverage. The pharmacy has initiated pt assistance for the pt to receive the aimovig at zero cost.  I contacted the pt and advised via vm this process has been started for her and the pharmacy will be in touch once approval has been received or not. Pt advised to call back if she had any further questions.

## 2018-06-01 NOTE — Telephone Encounter (Signed)
Pt has called for RN Megan, re: her being without her Aimovig for 10-12 days.  Pt is asking for a call from RN

## 2018-06-01 NOTE — Telephone Encounter (Addendum)
I contacted the pt to further discuss this message but had to leave a vm. I explained to the pt via vm the aimovig PAP is pending with the [facility]. I advised the pt to contact them about the status of the patient assistance to receive further information regarding her medication. I also stated I would make Dr. Jannifer Franklin aware Aimovig pt assistance is still pending. Also explained our office will be closed the rest of the day today (06/01/18) and closed tomorrow (06/02/18) Pt advised we would reopen on 06/05/18.

## 2018-06-07 MED FILL — ?PANTOPRAZOLE SOD DR 40MG T: 40 | 30 days supply | Qty: 60 | Fill #3

## 2018-06-07 MED FILL — METOPROLOL TARTRATE 25 MG T: 25 | 30 days supply | Qty: 90 | Fill #1

## 2018-06-08 MED ORDER — TOPIRAMATE 25 MG PO TABS: ORAL_TABLET | ORAL | 3 refills | Status: DC

## 2018-06-08 MED FILL — TOPIRAMATE 25 MG TABS: 25 | 30 days supply | Qty: 60 | Fill #0

## 2018-06-08 NOTE — Telephone Encounter (Signed)
There has been a delay in getting the Aimovig to the patient.  We will try Topamax in the meantime, she has already been tried on gabapentin, nortriptyline, and Lopressor.

## 2018-06-08 NOTE — Telephone Encounter (Signed)
Kelly[facility] said PAP is not pending with their pharmacy as it was not initiated there. She faxed PAP application form from manufacture on 2/14 our clinic. She faxed it [phone]. She said the patient has since been in their pharmacy asking for the medication. Please call to advise

## 2018-06-08 NOTE — Addendum Note (Signed)
Addended by: Kathrynn Ducking on: 06/08/2018 04:31 PM   Modules accepted: Orders

## 2018-06-08 NOTE — Telephone Encounter (Signed)
I contacted [facility]. She was able to advise me the provider at the pharmacy could not sign off on the Aimovig PAP. I requested the packet to be sent to our office for the pt and I have received. I contacted the pt and advised would be happy to complete this form for her.  I advised during the mean time I would send a message to Dr. Jannifer Franklin to see if he would recommend any alternative medications until patient assistance is approved. Pt was agreeable to this and states she is having frequent headaches.

## 2018-06-12 ENCOUNTER — Telehealth: Payer: Self-pay

## 2018-06-12 NOTE — Telephone Encounter (Signed)
I spoke with pt today and she advised she thinks she I having an reaction to her Topamax 25 mg 1 tablet at bed time.  Pt states since starting this medication on 06/08/18 she has been itching on the top of her head and arms. Pt denies any rash, bumps or vesicles. But states the itching feeling is present constantly.   I advised the pt to hold taking her dosage this evening for right now.  I also advised Dr. Jannifer Franklin is currently out of the office but would return on 06/13/18. I stated to the pt I would send message to [facility] in [facility] and see if she had any further recommendations or if we should wait until Dr. Jannifer Franklin return. Pt was agreeable to this plan.

## 2018-06-12 NOTE — Telephone Encounter (Signed)
I spoke with Dr. Brett Fairy verbally regarding this message. She was in agreement with the pt d/c the topamax and also advised the pt to take benadryl has needed.  I contacted the pt and advised of this message and she was agreeable to instructions. I advised the pt once Dr. Aletha Halim returns on 06/12/18 I would fwd message to him to receive further instructions. Pt was agreeable.

## 2018-06-12 NOTE — Telephone Encounter (Signed)
PAP forms have been mailed to the pt for her to completed and mail back. Dr. Jannifer Franklin has completed his portion. Copy of forms provided to [facility] to hold until pt returns her copies.

## 2018-06-13 MED ORDER — BACLOFEN 10 MG PO TABS: 5.0000 mg | ORAL_TABLET | Freq: Two times a day (BID) | ORAL | 3 refills | Status: DC

## 2018-06-13 MED ORDER — PREDNISONE 5 MG PO TABS: ORAL_TABLET | ORAL | 0 refills | Status: DC

## 2018-06-13 NOTE — Addendum Note (Signed)
Addended by: Kathrynn Ducking on: 06/13/2018 04:55 PM   Modules accepted: Orders

## 2018-06-13 NOTE — Telephone Encounter (Addendum)
I took phone call from phone room staff. Pt states the itching from yesterday has increased and now she has a rash located on her head, belly and bottom. She states the rash appeared yesterday evening and also states she did not pick up the OTC benadryl as recommended yesterday. I strongly advised the pt to do so this evening and pt states she will.   I advised the pt I would fwd this message to up dated Dr. Jannifer Franklin. Pt confirmed she has not taken any topamax since 06/11/18.  Topamax added to allergy list.

## 2018-06-13 NOTE — Telephone Encounter (Signed)
I called the patient.  A single tablet of the Topamax resulted in a rash on the upper body and head, the patient will be given a small prednisone Dosepak for the rash and itching.  She will stop the Topamax.  We will try low-dose baclofen 5 mg twice daily, a prescription was sent in.  She has not started the baclofen until the rash is gone.

## 2018-06-14 ENCOUNTER — Telehealth: Payer: Self-pay

## 2018-06-14 ENCOUNTER — Other Ambulatory Visit: Payer: Self-pay

## 2018-06-14 DIAGNOSIS — D649 Anemia, unspecified: Secondary | ICD-10-CM

## 2018-06-14 DIAGNOSIS — E876 Hypokalemia: Secondary | ICD-10-CM

## 2018-06-14 DIAGNOSIS — Z9109 Other allergy status, other than to drugs and biological substances: Secondary | ICD-10-CM

## 2018-06-14 DIAGNOSIS — F419 Anxiety disorder, unspecified: Secondary | ICD-10-CM

## 2018-06-14 MED ORDER — LEVOCETIRIZINE DIHYDROCHLORIDE 5 MG PO TABS: 5.0000 mg | ORAL_TABLET | Freq: Every evening | ORAL | 2 refills | Status: DC

## 2018-06-14 MED ORDER — POTASSIUM CHLORIDE CRYS ER 20 MEQ PO TBCR: 20.0000 meq | EXTENDED_RELEASE_TABLET | Freq: Every day | ORAL | 1 refills | Status: DC

## 2018-06-14 MED ORDER — HYDROXYZINE HCL 10 MG PO TABS: 10.0000 mg | ORAL_TABLET | Freq: Three times a day (TID) | ORAL | 2 refills | Status: DC | PRN

## 2018-06-14 MED ORDER — FERROUS SULFATE 325 (65 FE) MG PO TABS: ORAL_TABLET | ORAL | 3 refills | Status: DC

## 2018-06-14 MED ORDER — GABAPENTIN 300 MG PO CAPS: 300.0000 mg | ORAL_CAPSULE | Freq: Every day | ORAL | 3 refills | Status: DC

## 2018-06-14 MED FILL — POTASSIUM CL ER 20 MEQ TAB: 20 | 30 days supply | Qty: 30 | Fill #0

## 2018-06-14 MED FILL — LEVOCETIRIZINE 5 MG TABLET: 5 | 30 days supply | Qty: 30 | Fill #0

## 2018-06-14 MED FILL — predniSONE 5 MG TABS: 5 | 6 days supply | Qty: 21 | Fill #0

## 2018-06-14 MED FILL — FERROUS SULFATE 325 MG TAB: 325 (65 FE) | 30 days supply | Qty: 30 | Fill #0

## 2018-06-14 MED FILL — hydrOXYzine HCL 10 MG TABS: 10 | 10 days supply | Qty: 30 | Fill #0

## 2018-06-14 MED FILL — GABAPENTIN 300 MG CAPSULE: 300 | 30 days supply | Qty: 30 | Fill #0

## 2018-06-14 MED FILL — BACLOFEN 10 MG TABLET: 10 | 30 days supply | Qty: 30 | Fill #0

## 2018-06-14 NOTE — Telephone Encounter (Signed)
error 

## 2018-06-14 NOTE — Telephone Encounter (Signed)
[facility]

## 2018-06-20 NOTE — Telephone Encounter (Signed)
Noted, thanks!

## 2018-06-20 NOTE — Telephone Encounter (Signed)
Megan Patient mailed back her completed paper work for Patient assistance . I have faxed to Cheryle Horsfall Patient [facility]ocate Telephone [phone] - fax [phone]. Thanks Hinton Dyer.  AMGEN.

## 2018-06-23 ENCOUNTER — Telehealth: Payer: Self-pay

## 2018-06-23 NOTE — Telephone Encounter (Signed)
Patient is complaining of abdominal pain and chest pain.  She says that these are the same issues she has been having.  Offered earlier appointment but declined.  Also advised patient to go to emergency room if pain does not go away with medication.  She will also reach out to her cardiologist as needed. Patient agreed.

## 2018-07-02 ENCOUNTER — Telehealth: Payer: Self-pay

## 2018-07-02 NOTE — Telephone Encounter (Signed)
Patient telephoned regarding their 0840 follow up appointment on 07/03/2018.  Offered a telephone visit with the provider.  Patient accepted the offer with an interpreter and advised provider will call them around their scheduled appointment time.

## 2018-07-03 ENCOUNTER — Telehealth: Payer: Self-pay | Admitting: *Deleted

## 2018-07-03 ENCOUNTER — Other Ambulatory Visit: Payer: Self-pay

## 2018-07-03 ENCOUNTER — Telehealth (INDEPENDENT_AMBULATORY_CARE_PROVIDER_SITE_OTHER): Payer: Self-pay | Admitting: Family Medicine

## 2018-07-03 DIAGNOSIS — K5900 Constipation, unspecified: Secondary | ICD-10-CM

## 2018-07-03 DIAGNOSIS — Z789 Other specified health status: Secondary | ICD-10-CM

## 2018-07-03 DIAGNOSIS — R1084 Generalized abdominal pain: Secondary | ICD-10-CM

## 2018-07-03 DIAGNOSIS — R000 Tachycardia, unspecified: Secondary | ICD-10-CM

## 2018-07-03 MED ORDER — METOPROLOL SUCCINATE ER 100 MG PO TB24: 100.0000 mg | ORAL_TABLET | Freq: Every day | ORAL | 1 refills | Status: DC

## 2018-07-03 MED ORDER — ERENUMAB-AOOE 140 MG/ML ~~LOC~~ SOAJ: 140.0000 mg | SUBCUTANEOUS | 4 refills | Status: DC

## 2018-07-03 NOTE — Progress Notes (Signed)
Virtual Visit via Telephone Note  I connected with Betty Cain on 07/03/18 at  8:40 AM EDT by telephone and verified that I am speaking with the correct person using two identifiers.   I discussed the limitations, risks, security and privacy concerns of performing an evaluation and management service by telephone and the availability of in person appointments. I also discussed with the patient that there may be a patient responsible charge related to this service. The patient expressed understanding and agreed to proceed.  The interpreter was used throughout the entire encounter.   History of Present Illness:  She states that her medications are not working for her heart and she feels that her heart "is jumping out of my skin". This occurs multiple times throughout the week. Because of this, she has increased her metoprolol to 2 pills BID. It is prescribed 1.5 BID by cardiology.   Her stomach pain is increasing and she is having diarrhea and constipation with "stomach turning" x 3 weeks. She denies fever, chills or night sweats. She states that she was also seen by neurology and prescribed aimovig. Patient states that she was unable to get this due to cost at [facility].    Observations/Objective: Patient with normal speech, tone and voice rhythm.    Assessment and Plan:  - Language barrier to communication - Sinus tachycardia I instructed the patient to take her medications as prescribed. Changed her to once a day Toprol XL for better adherence.  - metoprolol succinate (TOPROL XL) 100 MG 24 hr tablet; Take 1 tablet (100 mg total) by mouth daily. Take with or immediately following a meal.  Dispense: 90 tablet; Refill: 1 - Generalized abdominal pain - Constipation, unspecified constipation type  Patient is being followed by a specialist for these condition. Patient advised to continue with follow up appointments. PCP will continue to monitor progress.  Advised patient to  contact her specialists to further discuss her pain and constipation. Patient advised to utilize the interpretor service with every telephone or face to face encounter.  Also explained that some of her abdominal pain may stem from anxiety. She has been advised of this in the past and was encouraged to seek counseling at [facility]. At this point, she has not done so.   Follow Up Instructions:    I discussed the assessment and treatment plan with the patient. The patient was provided an opportunity to ask questions and all were answered. The patient agreed with the plan and demonstrated an understanding of the instructions.   The patient was advised to call back or seek an in-person evaluation if the symptoms worsen or if the condition fails to improve as anticipated.  I provided 25 minutes of non-face-to-face time during this encounter.   Lanae Boast, FNP

## 2018-07-03 NOTE — Patient Instructions (Signed)
Metoprolol extended-release capsules What is this medicine? METOPROLOL (me TOE proe lole) is a beta-blocker. Beta-blockers reduce the workload on the heart and help it to beat more regularly. This medicine is used to treat high blood pressure and to prevent chest pain. It is also used after a heart attack to prevent an additional heart attack from occurring. This medicine may be used for other purposes; ask your health care provider or pharmacist if you have questions. COMMON BRAND NAME(S): KAPSPARGO What should I tell my health care provider before I take this medicine? They need to know if you have any of these conditions: -diabetes -heart disease -liver disease -lung or breathing disease, like asthma -pheochromocytoma -thyroid disease -an unusual or allergic reaction to metoprolol, other beta-blockers, medicines, foods, dyes, or preservatives -pregnant or trying to get pregnant -breast-feeding How should I use this medicine? Take this medicine by mouth. The capsules can be swallowed whole or opened carefully and the contents sprinkled over a small amount (teaspoonful) of soft food, such as applesauce, pudding, or yogurt. This mixture must be swallowed within 60 minutes and not stored for future use. Do not chew this medicine. You can take it with or without food. If it upsets your stomach, take it with food. Take your medicine at regular intervals. Do not take it more often than directed. Do not stop taking except on your doctor's advice. Talk to your pediatrician regarding the use of this medicine in children. While this drug may be prescribed for children as young as 6 for selected conditions, precautions do apply. Overdosage: If you think you have taken too much of this medicine contact a poison control center or emergency room at once. NOTE: This medicine is only for you. Do not share this medicine with others. What if I miss a dose? If you miss a dose, take it as soon as you can. If it  is almost time for your next dose, take only that dose. Do not take double or extra doses. What may interact with this medicine? This medicine may interact with the following medications: -certain medicines for blood pressure, heart disease, irregular heart beat -epinephrine -fluoxetine -MAOIs like Carbex, Eldepryl, Marplan, Nardil, and Parnate -paroxetine -reserpine This list may not describe all possible interactions. Give your health care provider a list of all the medicines, herbs, non-prescription drugs, or dietary supplements you use. Also tell them if you smoke, drink alcohol, or use illegal drugs. Some items may interact with your medicine. What should I watch for while using this medicine? You may get drowsy or dizzy. Do not drive, use machinery, or do anything that needs mental alertness until you know how this medicine affects you. Do not stand or sit up quickly, especially if you are an older patient. This reduces the risk of dizzy or fainting spells. Alcohol may interfere with the effect of this medicine. Avoid alcoholic drinks. Visit your doctor or health care professional for regular checks on your progress. Check your blood pressure as directed. Ask your doctor or health care professional what your blood pressure should be and when you should contact him or her. Do not treat yourself for coughs, colds, or pain while you are using this medicine without asking your doctor or health care professional for advice. Some ingredients may increase your blood pressure. What side effects may I notice from receiving this medicine? Side effects that you should report to your doctor or health care professional as soon as possible: -allergic reactions like skin rash, itching  or hives, swelling of the face, lips, or tongue -cold hands or feet -signs and symptoms of low blood pressure like dizziness; feeling faint or lightheaded, falls; unusually weak or tired -signs of worsening heart failure like  breathing problems, swelling in your legs and feet -suicidal thoughts or other mood changes -unusually slow heartbeat Side effects that usually do not require medical attention (report these to your doctor or health care professional if they continue or are bothersome): -anxious -change in sex drive or performance -diarrhea -headache -trouble sleeping -tiredness -upset stomach This list may not describe all possible side effects. Call your doctor for medical advice about side effects. You may report side effects to FDA at 1-800-FDA-1088. Where should I keep my medicine? Keep out of the reach of children. Store at room temperature between 15 and 30 degrees C (59 and 86 degrees F). Throw away any unused medicine after the expiration date. NOTE: This sheet is a summary. It may not cover all possible information. If you have questions about this medicine, talk to your doctor, pharmacist, or health care provider.  2019 Elsevier/Gold Standard (2016-10-20 09:19:37)

## 2018-07-03 NOTE — Telephone Encounter (Signed)
Patient having difficulty getting Aimovig filled at [facility]. Per [facility] pharmacy original prescribe is not affiliated with them. Lanae Boast, NP will prescribe Aimvig for patient and the financial coordinator will help patient get assistance with the medication. Will call patient and have patient get online and get the copay assistance card while waiting on patient assistance. Will attempt to call patient with interpreter.

## 2018-07-05 MED ORDER — ERENUMAB-AOOE 140 MG/ML ~~LOC~~ SOAJ: 140.0000 mg | SUBCUTANEOUS | 4 refills | Status: DC

## 2018-07-05 MED FILL — FLUTICASONE PROP 50 MCG SPR: 50 | 30 days supply | Qty: 16 | Fill #0

## 2018-07-05 MED FILL — METOPROLOL TARTRATE 25 MG T: 25 | 30 days supply | Qty: 90 | Fill #2

## 2018-07-05 MED FILL — !VENTOLIN HFA INHALER: 108 (90 BAS | 25 days supply | Qty: 18 | Fill #2

## 2018-07-05 NOTE — Telephone Encounter (Addendum)
With interpreter assistant I spoke with patient and informed her that we could try

## 2018-07-06 MED FILL — $BREO ELLIPTA 100-25 MCG IH: 100-25 MCG | 30 days supply | Qty: 60 | Fill #1

## 2018-07-06 MED FILL — PANTOPRAZOLE SOD DR 40 MG T: 40 | 30 days supply | Qty: 60 | Fill #4

## 2018-07-25 MED FILL — LEVOCETIRIZINE 5 MG TABLET: 5 | 30 days supply | Qty: 30 | Fill #1

## 2018-07-25 MED FILL — GABAPENTIN 300 MG CAPSULE: 300 | 30 days supply | Qty: 30 | Fill #1

## 2018-07-25 MED FILL — FERROUS SULFATE 325 MG TAB: 325 (65 FE) | 30 days supply | Qty: 30 | Fill #1

## 2018-07-25 MED FILL — hydrOXYzine HCL 10 MG TABS: 10 | 10 days supply | Qty: 30 | Fill #1

## 2018-07-25 MED FILL — POTASSIUM CL ER 20 MEQ TAB: 20 | 30 days supply | Qty: 30 | Fill #1

## 2018-08-01 ENCOUNTER — Telehealth: Payer: Self-pay

## 2018-08-01 NOTE — Telephone Encounter (Signed)
Called patient, She is asking for the status of her medication for headache. I advised her I will call [facility] this afternoon and see if there is any progress and call her back.

## 2018-08-01 NOTE — Telephone Encounter (Signed)
Called and spoke with [facility]. Patient is to print off coupon online for 1 month free and then they will help assist her with patient assistance forms for refills.   Tried to call patient, no answer. Left a message to call back. Thanks!

## 2018-08-01 NOTE — Telephone Encounter (Signed)
Called [facility], unable to reach anyone will try later.

## 2018-08-02 ENCOUNTER — Encounter: Payer: Self-pay | Admitting: Family Medicine

## 2018-08-02 ENCOUNTER — Ambulatory Visit (INDEPENDENT_AMBULATORY_CARE_PROVIDER_SITE_OTHER): Payer: Self-pay | Admitting: Family Medicine

## 2018-08-02 ENCOUNTER — Other Ambulatory Visit: Payer: Self-pay

## 2018-08-02 DIAGNOSIS — N939 Abnormal uterine and vaginal bleeding, unspecified: Secondary | ICD-10-CM

## 2018-08-02 DIAGNOSIS — R000 Tachycardia, unspecified: Secondary | ICD-10-CM

## 2018-08-02 DIAGNOSIS — Z789 Other specified health status: Secondary | ICD-10-CM

## 2018-08-02 MED ORDER — NORGESTIMATE-ETH ESTRADIOL 0.25-35 MG-MCG PO TABS: 1.0000 | ORAL_TABLET | Freq: Every day | ORAL | 0 refills | Status: DC

## 2018-08-02 MED ORDER — METOPROLOL SUCCINATE ER 100 MG PO TB24: 100.0000 mg | ORAL_TABLET | Freq: Every day | ORAL | 1 refills | Status: DC

## 2018-08-02 MED FILL — METOPROLOL SUCCINATE ER 100: 100 | 30 days supply | Qty: 30 | Fill #0

## 2018-08-02 NOTE — Progress Notes (Signed)
  Patient [facility]  Virtual Visit via Telephone Note  I connected with Betty Cain on 08/02/18 at  8:40 AM EDT by telephone and verified that I am speaking with the correct person using two identifiers.   I discussed the limitations, risks, security and privacy concerns of performing an evaluation and management service by telephone and the availability of in person appointments. I also discussed with the patient that there may be a patient responsible charge related to this service. The patient expressed understanding and agreed to proceed.   History of Present Illness: Betty Cain  has a past medical history of Anemia, Asthma, Common migraine with intractable migraine (06/28/2017), GERD (gastroesophageal reflux disease), IUD (intrauterine device) in place (09-27-11), Neonatal death, Palpitations, Shortness of breath, Spinal headache, and Valvular heart disease. Patient reports continued abdominal discomfort and headaches. She is requesting a refill of metoprolol. She states that she increased to 50 mg BID. She also states that she has increased fatigue. She report compliance with all medications. Requesting a one month supply of OCPs due to Rhamadan and prolonged menstruation with the nexplanon.  She denies a history of blood clots and states that she has taken ocps in the past for this without problems.     Observations/Objective: Patient with regular voice tone, rate and rhythm. Speaking calmly and is in no apparent distress.  Interpreter services utilized throughout the encounter.    Assessment and Plan: 1. Sinus tachycardia Informed patient that beta blockers can cause an increase in fatigue.  - metoprolol succinate (TOPROL XL) 100 MG 24 hr tablet; Take 1 tablet (100 mg total) by mouth daily. Take with or immediately following a meal.  Dispense: 90 tablet; Refill: 1  2. Abnormal uterine bleeding Sent in one pack of OCPs for patinet.  -  norgestimate-ethinyl estradiol (SPRINTEC 28) 0.25-35 MG-MCG tablet; Take 1 tablet by mouth daily.  Dispense: 1 Package; Refill: 0  3. Language barrier to communication Interpreter services utilized throughout the encounter.    Follow Up Instructions:  We discussed hand washing, using hand sanitizer when soap and water are not available, only going out when absolutely necessary, and social distancing. Explained to patient that she is immunocompromised and will need to take precautions during this time.   I discussed the assessment and treatment plan with the patient. The patient was provided an opportunity to ask questions and all were answered. The patient agreed with the plan and demonstrated an understanding of the instructions.   The patient was advised to call back or seek an in-person evaluation if the symptoms worsen or if the condition fails to improve as anticipated.  I provided 30 minutes of non-face-to-face time during this encounter.  Ms. Andr L. Nathaneil Canary, FNP-BC [facility] [address] [place], [place] [address] [phone]

## 2018-08-03 ENCOUNTER — Telehealth: Payer: Self-pay | Admitting: Neurology

## 2018-08-03 MED ORDER — ERENUMAB-AOOE 140 MG/ML ~~LOC~~ SOAJ: 140.0000 mg | SUBCUTANEOUS | 4 refills | Status: DC

## 2018-08-03 NOTE — Telephone Encounter (Signed)
Kelly from [facility] is calling in concerning patients Aimovig and she is stating a hard copy script needs to be sent to [facility] for PAF  [phone]

## 2018-08-03 NOTE — Telephone Encounter (Signed)
Signed rx given to MD to sign and given to Hinton Dyer C to process.

## 2018-08-03 NOTE — Telephone Encounter (Signed)
Faxed received ok conformation Fax# [facility] .

## 2018-08-03 NOTE — Telephone Encounter (Signed)
Rx has been printed and placed in MD's office for provider to sign.

## 2018-08-22 ENCOUNTER — Telehealth: Payer: Self-pay | Admitting: Neurology

## 2018-08-22 DIAGNOSIS — K589 Irritable bowel syndrome without diarrhea: Secondary | ICD-10-CM

## 2018-08-22 NOTE — Telephone Encounter (Signed)
Called patient to discuss. She stated she is only taking gabapentin. She stated [facility] never gave her Aimovig. She stated she is not taking any other medications. I advised I will call pharmacy. Called pharmacy, LVM requesting return call.

## 2018-08-22 NOTE — Telephone Encounter (Signed)
Pharmacy returned call, and requesting a call back

## 2018-08-22 NOTE — Progress Notes (Signed)
With the use of the language line, the pt was prescreened for Thursday appt with Dr. Havery Moros

## 2018-08-22 NOTE — Telephone Encounter (Signed)
Called pharmacy, phone continuously rang.

## 2018-08-22 NOTE — Telephone Encounter (Signed)
pts husband is calling in stating she is having a really bad headache and needs to know what she can do to help it until her appt on 08/28/2018

## 2018-08-22 NOTE — Telephone Encounter (Signed)
It is possible that the pharmacy does not carry Aimovig, we may need to call into another pharmacy.

## 2018-08-22 NOTE — Telephone Encounter (Signed)
Received call from patient. I informed her that I am waiting on call back from pharmacy about Aimovig Rx. She  verbalized understanding, appreciation.

## 2018-08-23 ENCOUNTER — Telehealth: Payer: Self-pay | Admitting: Neurology

## 2018-08-23 MED ORDER — VENLAFAXINE HCL ER 37.5 MG PO CP24: ORAL_CAPSULE | ORAL | 1 refills | Status: DC

## 2018-08-23 MED FILL — VENLAFAXINE HCL ER 37.5 MG: 37.5 | 30 days supply | Qty: 60 | Fill #0

## 2018-08-23 NOTE — Telephone Encounter (Signed)
Due to current COVID 19 pandemic, our office is severely reducing in office visits until further notice, in order to minimize the risk to our patients and healthcare providers.   Called patient to offer virtual visit for her 5/18 appointment. I called yesterday and spoke with her husband who informed me to call back because he was going to work. I called today and spoke with patient, who agreed to a virtual visit and verbalized understanding of directions. I have sent patient an e-mail with doxy link and instructions, as well as my name and office number/hours for reference. Patient's husband may act as interpreter. Patient understood our conversation and understands that she will receive a call from RN prior to appt.  Pt understands that although there may be some limitations with this type of visit, we will take all precautions to reduce any security or privacy concerns.  Pt understands that this will be treated like an in office visit and we will file with pt's insurance, and there may be a patient responsible charge related to this service.

## 2018-08-23 NOTE — Telephone Encounter (Addendum)
Received call back from [facility] w/pharmacy who stated  Application for Aimovig pt assistance started in [employer] for patient. They cannot order drug until they receive answer. Patient was notified of this process via interpreter, [facility] pharmacy. She stated on  08/03/18 pharmacist called and PAP application was still processing for Aimovig which can 1-2 months.  They will notify patient when they receive either approval or denial. I thanked her for calling with update.

## 2018-08-23 NOTE — Telephone Encounter (Signed)
Attempted to reach [facility]. On hold x 5 minutes then called disconnected. I sent e mail to them asking if they carry Aimovig and/or are able to order it.

## 2018-08-23 NOTE — Addendum Note (Signed)
Addended by: Kathrynn Ducking on: 08/23/2018 04:36 PM   Modules accepted: Orders

## 2018-08-23 NOTE — Telephone Encounter (Signed)
I called the patient.  The Aimovig is still being processed for possible patient assistance, we have not yet heard anything back.  The patient cannot take Topamax, she is already on a beta-blocker, she is off of the nortriptyline.  She remains on gabapentin.  I will send in a prescription for Effexor to see if this would help the headaches starting at 37.5 mg.

## 2018-08-24 ENCOUNTER — Other Ambulatory Visit: Payer: Self-pay

## 2018-08-24 ENCOUNTER — Encounter: Payer: Self-pay | Admitting: Gastroenterology

## 2018-08-24 ENCOUNTER — Ambulatory Visit (INDEPENDENT_AMBULATORY_CARE_PROVIDER_SITE_OTHER): Payer: Medicaid Other | Admitting: Gastroenterology

## 2018-08-24 ENCOUNTER — Encounter: Payer: Self-pay | Admitting: Neurology

## 2018-08-24 VITALS — Ht <= 58 in | Wt 158.0 lb

## 2018-08-24 DIAGNOSIS — K219 Gastro-esophageal reflux disease without esophagitis: Secondary | ICD-10-CM

## 2018-08-24 DIAGNOSIS — R109 Unspecified abdominal pain: Secondary | ICD-10-CM

## 2018-08-24 DIAGNOSIS — R194 Change in bowel habit: Secondary | ICD-10-CM

## 2018-08-24 MED ORDER — ONDANSETRON 4 MG PO TBDP: 4.0000 mg | ORAL_TABLET | Freq: Three times a day (TID) | ORAL | 3 refills | Status: DC | PRN

## 2018-08-24 MED ORDER — METHYLCELLULOSE (LAXATIVE) PO POWD: ORAL | Status: DC

## 2018-08-24 MED ORDER — SUCRALFATE 1 GM/10ML PO SUSP: 1.0000 g | Freq: Four times a day (QID) | ORAL | 2 refills | Status: DC | PRN

## 2018-08-24 MED ORDER — DICYCLOMINE HCL 10 MG PO CAPS: 10.0000 mg | ORAL_CAPSULE | Freq: Three times a day (TID) | ORAL | 3 refills | Status: DC | PRN

## 2018-08-24 MED FILL — $BREO ELLIPTA 100-25 MCG IH: 100-25 MCG | 90 days supply | Qty: 180 | Fill #2

## 2018-08-24 MED FILL — CARAFATE 1 GM/10 ML SUSP: 1 | 10 days supply | Qty: 420 | Fill #0

## 2018-08-24 MED FILL — DICYCLOMINE 10 MG CAPSULE: 10 | 30 days supply | Qty: 90 | Fill #0

## 2018-08-24 MED FILL — ONDANSETRON ODT 4 MG TABLET: 4 | 20 days supply | Qty: 60 | Fill #0

## 2018-08-24 MED FILL — ?PANTOPRAZOLE SOD DR 40MGTA: 40 | 30 days supply | Qty: 60 | Fill #5

## 2018-08-24 NOTE — Telephone Encounter (Signed)
I contacted the patient and was able to update allergies, meds and pmh for 08/28/18 visit.  Patient confirmed she had received link. Patient will need a phone interpreter on the line to help with visit. We can utilize Telephonic interpreter services. # [phone], press option [phone] for Arabic interpreter.

## 2018-08-24 NOTE — Progress Notes (Signed)
THIS ENCOUNTER IS A VIRTUAL VISIT DUE TO COVID-19 - PATIENT WAS NOT SEEN IN THE OFFICE. PATIENT HAS CONSENTED TO VIRTUAL VISIT / TELEMEDICINE VISIT. ATTEMPTS WERE MADE TO USE DOXIMITY APP AND FACTIME, NEITHER OF WHICH COULD GET VIDEO CAPABILITY TO WORK DUE TO TECHNICAL DIFFICULTIES WITH THE PATIENT"S PHONE. WE PROCEEDED WITH AUDIO ONLY TO TAKE CARE OF HER CONCERNS, USING TRANSLATOR SERVICES    Location of patient: home Location of provider: office Persons participating: myself, patient, translator Time spent on call: 40 minutes  HPI :  43 y/o female here for a follow up visit. She speaks Arabic and is accompanied by a Optometrist today. She has been evaluated in the past for GERD, and abdominal pain.  She has a history of H. Pylori as well as GERD. Previously had an H. Pylori IgG serology in April and was treated for that. He had recurrence of symptoms after treatment, was placed on Protonix and scheduled for an endoscopy with me in September. She had an EGD on September 25. She had some mild gastritis noted, unfortunately her procedure was aborted due to significant laryngospasm and oxygen desaturation. Esophagus was not well evaluated but a limited exam did not appear to have any significant abnormalities. Unfortunately given the procedure was aborted, biopsies not taken to confirm H. Pylori eradication. We stopped her Protonix and she submitted a stool study which was negative for H. Pylori. She's also had ongoing intermittent chest pains and has seen cardiology. Had prior EST which was normal. At our last visit her main complaint was abdominal pain.   She's had a prior CT scan and [place] as below which have looked okay. We have given her carafate and protonix to use. She increased her gabapentin, tried some tylenol, and recommended topical capsaicin for possible musculoskeletal pain. Protonix and carafate have helped her symptoms somewhat she was feeling better but ran out of these.   Since the last  visit her pain has persisted. Has not had much improvement. Hard to localize, feels it "all over her stomach, on the sides as well", hard to localize. Feels like someone is "hitting her". Feels it on the sides and can radiate into her back. Nothing really helps. Pain is present always at this point in time, but at times can worsen. It can be worse with eating at times. She has had nausea which bothers her, and some vomiting but not routinely. She has been fasting for Ramadan, which tends to make her feel better.   She has altered some constipation / diarrhea, with change in odor. She has had a lot of bloating and distension in her abdomen which bothers her and she thinks related to her pain. She does not look at her stool for blood. She does find some benefit in her discomfort with a bowel movement. Constipation slightly predominates.   Prior workup: CT abdomen pelvis 03/16/18 - normal [place] 01/15/14 - gallbladder normal EGD 01/04/18 - significant desaturation due to laryngospasm and procedure was aborted, mild gastriits, esophagus not well evaluated   Past Medical History:  Diagnosis Date  . Anemia   . Asthma   . Common migraine with intractable migraine 06/28/2017  . GERD (gastroesophageal reflux disease)    pos H pylori  . IBS (irritable bowel syndrome) 06/06/03  . Neonatal death    Vaginal delivery, full term-lived x2 hours.   . Palpitations   . Shortness of breath   . Spinal headache   . Valvular heart disease  Past Surgical History:  Procedure Laterality Date  . ADENOIDECTOMY     and tonsils (as a child)  . boil  2003   right elbow  . CESAREAN SECTION     x4  . CESAREAN SECTION  06/02/2011   Procedure: CESAREAN SECTION;  Surgeon: [name], MD;  Location: [facility];  Service: Gynecology;  Laterality: N/A;  Primary Cesarean Section Delivery Baby Boy @ 0004, Apgars 9/9  . CESAREAN SECTION N/A 12/10/2013   Procedure: REPEAT CESAREAN SECTION;  Surgeon: [name], MD;  Location:  [facility];  Service: Obstetrics;  Laterality: N/A;   Family History  Problem Relation Age of Onset  . Diabetes Mother   . Diabetes Father   . Diabetes Sister   . Anesthesia problems Neg Hx   . Colon cancer Neg Hx   . Esophageal cancer Neg Hx   . Rectal cancer Neg Hx   . Stomach cancer Neg Hx    Social History   Tobacco Use  . Smoking status: Never Smoker  . Smokeless tobacco: Never Used  Substance Use Topics  . Alcohol use: No  . Drug use: No   Current Outpatient Medications  Medication Sig Dispense Refill  . acetaminophen (TYLENOL) 500 MG tablet Take 1 tablet (500 mg total) by mouth every 6 (six) hours as needed. 30 tablet 0  . albuterol (PROVENTIL HFA;VENTOLIN HFA) 108 (90 Base) MCG/ACT inhaler Inhale 2 puffs into the lungs every 6 (six) hours as needed for wheezing or shortness of breath. 1 Inhaler 2  . benzonatate (TESSALON) 100 MG capsule Take 1 capsule (100 mg total) by mouth 2 (two) times daily as needed for cough. (Patient not taking: Reported on 08/22/2018) 20 capsule 0  . Blood Pressure Monitoring DEVI 1 each by Does not apply route daily. 1 Device 0  . Erenumab-aooe (AIMOVIG) 140 MG/ML SOAJ Inject 140 mg into the skin every 30 (thirty) days. 1 pen 4  . ergocalciferol (VITAMIN D2) 1.25 MG (50000 UT) capsule Take 50,000 Units by mouth once a week.    . etonogestrel (NEXPLANON) 68 MG IMPL implant 1 each by Subdermal route once.    . ferrous sulfate (FEROSUL) 325 (65 FE) MG tablet TAKE 1 TABLET (325 MG TOTAL) BY MOUTH DAILY. 30 tablet 3  . fluticasone (FLONASE) 50 MCG/ACT nasal spray Place 2 sprays into both nostrils daily. 16 g 6  . fluticasone furoate-vilanterol (BREO ELLIPTA) 100-25 MCG/INH AEPB Inhale 1 puff into the lungs daily. 84 each 3  . gabapentin (NEURONTIN) 300 MG capsule Take 1 capsule (300 mg total) by mouth at bedtime. 30 capsule 3  . hydrOXYzine (ATARAX/VISTARIL) 10 MG tablet Take 1 tablet (10 mg total) by mouth 3 (three) times daily as needed. 30 tablet 2  .  ipratropium (ATROVENT) 0.06 % nasal spray Place 2 sprays into both nostrils as needed for rhinitis.    Marland Kitchen levocetirizine (XYZAL) 5 MG tablet Take 1 tablet (5 mg total) by mouth every evening. 30 tablet 2  . metoprolol succinate (TOPROL XL) 100 MG 24 hr tablet Take 1 tablet (100 mg total) by mouth daily. Take with or immediately following a meal. 90 tablet 1  . norgestimate-ethinyl estradiol (SPRINTEC 28) 0.25-35 MG-MCG tablet Take 1 tablet by mouth daily. 1 Package 0  . pantoprazole (PROTONIX) 40 MG tablet Take 1 tablet (40 mg total) by mouth 2 (two) times daily before a meal. 180 tablet 1  . potassium chloride SA (K-DUR,KLOR-CON) 20 MEQ tablet Take 1 tablet (20 mEq total)  by mouth daily. 30 tablet 1  . sucralfate (CARAFATE) 1 GM/10ML suspension Take 10 mLs (1 g total) by mouth every 6 (six) hours as needed for up to 14 days. 420 mL 1  . venlafaxine XR (EFFEXOR XR) 37.5 MG 24 hr capsule 1 tablet daily for 1 week, then take 2 tablets daily 60 capsule 1   No current facility-administered medications for this visit.    Allergies  Allergen Reactions  . Topamax [Topiramate] Itching and Rash     Review of Systems: All systems reviewed and negative except where noted in HPI.   Lab Results  Component Value Date   WBC 10.3 01/16/2018   HGB 12.2 01/16/2018   HCT 37.9 01/16/2018   MCV 88.1 01/16/2018   PLT 362 01/16/2018    Lab Results  Component Value Date   CREATININE 0.63 02/27/2018   BUN 10 02/27/2018   NA 138 01/16/2018   K 4.5 01/20/2018   CL 101 01/16/2018   CO2 25 01/16/2018    Lab Results  Component Value Date   ALT 13 01/16/2018   AST 21 01/16/2018   ALKPHOS 66 01/16/2018   BILITOT 0.7 01/16/2018   Lab Results  Component Value Date   ESRSEDRATE 84 (H) 04/28/2018    Lab Results  Component Value Date   CRP 48 (H) 04/28/2018     Physical Exam: NA     ASSESSMENT AND PLAN: 43 y/o female here for reassessment of the following issues:  Abdominal pain / Change  in bowel habits / GERD - patient with ongoing persistent abdominal pain, prior workup with CT, [place], and EGD without clear cause. No anemia remotely but she has had elevated inflammatory markers on a few occasions. It has been difficult to get a good description of her symptoms despite translator assistance, she has a very hard time localizing her pain unless it is just that diffuse. More recently she has had some bowel habit changes. No evidence of IBD on prior imaging. At this point I am going to repeat labs to ensure stable - CBC, CMET, will also screen for celiac and repeat ESR and CRP. With her bowel changes I offered her a colonoscopy to further evaluate, ensure no evidence of IBD. Following discussion of risks / benefits she wanted to proceed with this. I offered her a trial of Bentyl in the interim, as well as Citrucel to help normalize bowels habits and some Zofran to use for nausea. We may consider a trial of TCA pending her course, would need to discuss with her PCP in light of some of her medication use. Other more rare etiologies to consider given her ethnicity, pain, inflammatory markers would be familial mediterranean fever. Will await her labs, colonoscopy, and trial of regimen, with further recommendations. She agreed. Otherwise, she can resume PPI and carafate for her reflux.   [place], MD [facility]

## 2018-08-24 NOTE — Patient Instructions (Addendum)
If you are age 41 or older, your body mass index should be between 23-30. Your Body mass index is 33.02 kg/m. If this is out of the aforementioned range listed, please consider follow up with your Primary Care Provider.  If you are age 35 or younger, your body mass index should be between 19-25. Your Body mass index is 33.02 kg/m. If this is out of the aformentioned range listed, please consider follow up with your Primary Care Provider.   To help prevent the possible spread of infection to our patients, communities, and staff; we will be implementing the following measures:  As of now we are not allowing any visitors/family members to accompany you to any upcoming appointments with [facility]. If you have any concerns about this please contact our office to discuss prior to the appointment.   Please go to the lab in the basement of our building to have lab work done. Hit "B" for basement when you get on the elevator.  When the doors open the lab is on your left.  We will call you with the results.  We have sent the following medications to your pharmacy for you to pick up at your convenience: Carafate Zofran 4mg  Orally dissolving tablets, Take every 8 hours as needed Bentyl 10mg , Take every 8 hours as needed  Please purchase Citrucel powder, over the counter, and take as directed, Daily  You have been scheduled for a colonoscopy with Dr. Havery Moros on 09-27-2018 at 10:00am, to arrive at 9:00am. Go to the 4th floor for your procedure.  You have been scheduled for a Nurse visit to be instructed on how to prepare for your colonoscopy procedure on Monday, 09-18-2018 at 10:00am.  Go to the 2nd floor.  Our office is located at [address] [name],  [address] .   Thank you for entrusting me with your care and for choosing [facility], Dr. Easton Cellar

## 2018-08-28 ENCOUNTER — Ambulatory Visit (INDEPENDENT_AMBULATORY_CARE_PROVIDER_SITE_OTHER): Payer: Self-pay | Admitting: Neurology

## 2018-08-28 ENCOUNTER — Other Ambulatory Visit (INDEPENDENT_AMBULATORY_CARE_PROVIDER_SITE_OTHER): Payer: Self-pay

## 2018-08-28 ENCOUNTER — Encounter: Payer: Self-pay | Admitting: Neurology

## 2018-08-28 ENCOUNTER — Other Ambulatory Visit: Payer: Self-pay

## 2018-08-28 ENCOUNTER — Telehealth: Payer: Self-pay

## 2018-08-28 DIAGNOSIS — R194 Change in bowel habit: Secondary | ICD-10-CM

## 2018-08-28 DIAGNOSIS — F419 Anxiety disorder, unspecified: Secondary | ICD-10-CM

## 2018-08-28 DIAGNOSIS — J3089 Other allergic rhinitis: Secondary | ICD-10-CM

## 2018-08-28 DIAGNOSIS — G43019 Migraine without aura, intractable, without status migrainosus: Secondary | ICD-10-CM

## 2018-08-28 DIAGNOSIS — K219 Gastro-esophageal reflux disease without esophagitis: Secondary | ICD-10-CM

## 2018-08-28 DIAGNOSIS — R109 Unspecified abdominal pain: Secondary | ICD-10-CM

## 2018-08-28 DIAGNOSIS — Z9109 Other allergy status, other than to drugs and biological substances: Secondary | ICD-10-CM

## 2018-08-28 DIAGNOSIS — N939 Abnormal uterine and vaginal bleeding, unspecified: Secondary | ICD-10-CM

## 2018-08-28 LAB — COMPREHENSIVE METABOLIC PANEL
ALT: 17 U/L (ref 0–35)
AST: 15 U/L (ref 0–37)
Albumin: 4.2 g/dL (ref 3.5–5.2)
Alkaline Phosphatase: 76 U/L (ref 39–117)
BUN: 7 mg/dL (ref 6–23)
CO2: 24 mEq/L (ref 19–32)
Calcium: 8.9 mg/dL (ref 8.4–10.5)
Chloride: 102 mEq/L (ref 96–112)
Creatinine, Ser: 0.56 mg/dL (ref 0.40–1.20)
GFR: 119.42 mL/min (ref >60.00)
Glucose, Bld: 151 mg/dL — ABNORMAL HIGH (ref 70–99)
Potassium: 3.7 mEq/L (ref 3.5–5.1)
Sodium: 137 mEq/L (ref 135–145)
Total Bilirubin: 0.3 mg/dL (ref 0.2–1.2)
Total Protein: 7.9 g/dL (ref 6.0–8.3)

## 2018-08-28 LAB — CBC WITH DIFFERENTIAL/PLATELET
Basophils Absolute: 0 10*3/uL (ref 0.0–0.1)
Basophils Relative: 0.6 % (ref 0.0–3.0)
Eosinophils Absolute: 0 10*3/uL (ref 0.0–0.7)
Eosinophils Relative: 0.5 % (ref 0.0–5.0)
HCT: 37.4 % (ref 36.0–46.0)
Hemoglobin: 12.6 g/dL (ref 12.0–15.0)
Lymphocytes Relative: 55 % — ABNORMAL HIGH (ref 12.0–46.0)
Lymphs Abs: 2.7 10*3/uL (ref 0.7–4.0)
MCHC: 33.7 g/dL (ref 30.0–36.0)
MCV: 81.7 fl (ref 78.0–100.0)
Monocytes Absolute: 0.4 10*3/uL (ref 0.1–1.0)
Monocytes Relative: 7.4 % (ref 3.0–12.0)
Neutro Abs: 1.8 10*3/uL (ref 1.4–7.7)
Neutrophils Relative %: 36.5 % — ABNORMAL LOW (ref 43.0–77.0)
Platelets: 323 10*3/uL (ref 150.0–400.0)
RBC: 4.58 Mil/uL (ref 3.87–5.11)
RDW: 14.8 % (ref 11.5–15.5)
WBC: 4.9 10*3/uL (ref 4.0–10.5)

## 2018-08-28 LAB — C-REACTIVE PROTEIN: CRP: 3 mg/dL (ref 0.5–20.0)

## 2018-08-28 LAB — SEDIMENTATION RATE: Sed Rate: 113 mm/hr — ABNORMAL HIGH (ref 0–20)

## 2018-08-28 LAB — IGA: IgA: 150 mg/dL (ref 68–378)

## 2018-08-28 MED ORDER — FLUTICASONE PROPIONATE 50 MCG/ACT NA SUSP: 2.0000 | Freq: Every day | NASAL | 6 refills | Status: DC

## 2018-08-28 MED ORDER — LEVOCETIRIZINE DIHYDROCHLORIDE 5 MG PO TABS: 5.0000 mg | ORAL_TABLET | Freq: Every evening | ORAL | 2 refills | Status: DC

## 2018-08-28 MED ORDER — VENLAFAXINE HCL ER 37.5 MG PO CP24: ORAL_CAPSULE | ORAL | 1 refills | Status: DC

## 2018-08-28 MED ORDER — GABAPENTIN 300 MG PO CAPS: ORAL_CAPSULE | ORAL | 3 refills | Status: DC

## 2018-08-28 MED ORDER — RIZATRIPTAN BENZOATE 10 MG PO TABS: 10.0000 mg | ORAL_TABLET | Freq: Three times a day (TID) | ORAL | 3 refills | Status: DC | PRN

## 2018-08-28 MED ORDER — NORGESTIMATE-ETH ESTRADIOL 0.25-35 MG-MCG PO TABS: 1.0000 | ORAL_TABLET | Freq: Every day | ORAL | 0 refills | Status: DC

## 2018-08-28 MED FILL — LEVOCETIRIZINE 5 MG TABLET: 5 | 30 days supply | Qty: 30 | Fill #0

## 2018-08-28 MED FILL — ?RIZATRIPTAN 10 MG TABLET: 10 | 30 days supply | Qty: 10 | Fill #0

## 2018-08-28 MED FILL — GABAPENTIN 300 MG CAPSULE: 300 | 30 days supply | Qty: 90 | Fill #0

## 2018-08-28 MED FILL — MONO-LINYAH 28 TABLET: 0.25-35 | 28 days supply | Qty: 28 | Fill #0

## 2018-08-28 MED FILL — FLUTICASONE PROP 50 MCG SPR: 50 | 30 days supply | Qty: 16 | Fill #0

## 2018-08-28 NOTE — Progress Notes (Signed)
° ° ° °  Virtual Visit via Video Note  I connected with Betty Cain on 08/28/18 at  8:30 AM EDT by a video enabled telemedicine application and verified that I am speaking with the correct person using two identifiers.  Location: Patient: The patient is at home. Provider: The physician is at office.   I discussed the limitations of evaluation and management by telemedicine and the availability of in person appointments. The patient expressed understanding and agreed to proceed.  History of Present Illness: Betty Cain is a 43 year old right-handed Arabic female with a history of almost daily headaches.  The patient may have headaches that come on and last up to 10 days, she may have a few days without headache during the month.  Some of the headaches are disabling to her.  The headaches are usually in the front of the head but may also be in the back of the head, she may have some associated neck pain and crepitus in the neck, if she moves her head a certain way the neck pain dissipates.  She will have flashes of light with the headaches and some visual blurring.  She continues to have some low back pain and some tingling down in the legs.  She has ongoing abdominal pain and is followed by gastroenterologist for this.  She has problems with increased heart rate and is on metoprolol.  She does report photophobia with the headache without phonophobia.  She takes Tylenol and ibuprofen without much benefit.  She has had MRI of the brain that was normal, x-ray of the lumbar spine was unremarkable.  She continues to have elevation of sedimentation rate which is of unclear clinical significance.  She had been on nortriptyline, she is off the medication.  She was sent in a prescription for Effexor but apparently the pharmacy never got the prescription.  We are trying to get her on Aimovig, but she requires patient assistance and this has not yet been approved.  The patient is on very low-dose gabapentin taking  only 300 mg in the evening.  The evaluation was done with the help of an interpreter.   Observations/Objective: The video evaluation reveals that the patient is alert and cooperative.  Extraocular movements are full, speech is well enunciated not aphasic, the patient appears to have a symmetric face.  Assessment and Plan: 1.  Intractable migraine headache  2.  Chronic low back pain, paresthesias in the legs  The patient will be placed on the Effexor, a prescription again was sent in.  The patient will be increased on the gabapentin taking 300 mg twice daily for a week and then go to 300 mg in the morning and 600 mg in the evening.  Maxalt will be given to take if needed for the headache.  The patient will follow-up in about 3 months.  Follow Up Instructions: 1-month follow-up, may see nurse practitioner.   I discussed the assessment and treatment plan with the patient. The patient was provided an opportunity to ask questions and all were answered. The patient agreed with the plan and demonstrated an understanding of the instructions.   The patient was advised to call back or seek an in-person evaluation if the symptoms worsen or if the condition fails to improve as anticipated.  I provided 30 minutes of non-face-to-face time during this encounter.   Kathrynn Ducking, MD

## 2018-08-28 NOTE — Telephone Encounter (Signed)
Refills sent into pharmacy. Thanks!  

## 2018-08-29 LAB — TISSUE TRANSGLUTAMINASE, IGA: (tTG) Ab, IgA: 1 U/mL

## 2018-08-30 ENCOUNTER — Encounter (HOSPITAL_COMMUNITY): Payer: Self-pay | Admitting: Emergency Medicine

## 2018-08-30 ENCOUNTER — Emergency Department (HOSPITAL_COMMUNITY)
Admission: EM | Admit: 2018-08-30 | Discharge: 2018-08-30 | Disposition: A | Discharge status: Home / Self Care | Payer: Medicaid Other | Attending: Emergency Medicine | Admitting: Emergency Medicine

## 2018-08-30 ENCOUNTER — Emergency Department (HOSPITAL_COMMUNITY): Payer: Medicaid Other

## 2018-08-30 ENCOUNTER — Other Ambulatory Visit: Payer: Self-pay

## 2018-08-30 ENCOUNTER — Telehealth: Payer: Self-pay

## 2018-08-30 DIAGNOSIS — U071 COVID-19: Secondary | ICD-10-CM | POA: Insufficient documentation

## 2018-08-30 DIAGNOSIS — J45909 Unspecified asthma, uncomplicated: Secondary | ICD-10-CM | POA: Insufficient documentation

## 2018-08-30 DIAGNOSIS — B342 Coronavirus infection, unspecified: Secondary | ICD-10-CM

## 2018-08-30 DIAGNOSIS — Z79899 Other long term (current) drug therapy: Secondary | ICD-10-CM | POA: Insufficient documentation

## 2018-08-30 LAB — I-STAT BETA HCG BLOOD, ED (MC, WL, AP ONLY): I-stat hCG, quantitative: <5 m[IU]/mL (ref <5)

## 2018-08-30 LAB — CBC WITH DIFFERENTIAL/PLATELET
Abs Immature Granulocytes: 0.01 10*3/uL (ref 0.00–0.07)
Basophils Absolute: 0 10*3/uL (ref 0.0–0.1)
Basophils Relative: 0 %
Eosinophils Absolute: 0 10*3/uL (ref 0.0–0.5)
Eosinophils Relative: 0 %
HCT: 37.9 % (ref 36.0–46.0)
Hemoglobin: 12 g/dL (ref 12.0–15.0)
Immature Granulocytes: 0 %
Lymphocytes Relative: 38 %
Lymphs Abs: 2.1 10*3/uL (ref 0.7–4.0)
MCH: 26.8 pg (ref 26.0–34.0)
MCHC: 31.7 g/dL (ref 30.0–36.0)
MCV: 84.8 fL (ref 80.0–100.0)
Monocytes Absolute: 0.3 10*3/uL (ref 0.1–1.0)
Monocytes Relative: 6 %
Neutro Abs: 3.1 10*3/uL (ref 1.7–7.7)
Neutrophils Relative %: 56 %
Platelets: 331 10*3/uL (ref 150–400)
RBC: 4.47 MIL/uL (ref 3.87–5.11)
RDW: 13.8 % (ref 11.5–15.5)
WBC: 5.6 10*3/uL (ref 4.0–10.5)
nRBC: 0 % (ref 0.0–0.2)

## 2018-08-30 LAB — TROPONIN I: Troponin I: <0.03 ng/mL (ref <0.03)

## 2018-08-30 LAB — URINALYSIS, ROUTINE W REFLEX MICROSCOPIC
Bilirubin Urine: NEGATIVE
Glucose, UA: NEGATIVE mg/dL
Ketones, ur: NEGATIVE mg/dL
Leukocytes,Ua: NEGATIVE
Nitrite: NEGATIVE
Protein, ur: 30 mg/dL — AB
RBC / HPF: >50 RBC/hpf — ABNORMAL HIGH (ref 0–5)
Specific Gravity, Urine: 1.02 (ref 1.005–1.030)
pH: 5 (ref 5.0–8.0)

## 2018-08-30 LAB — COMPREHENSIVE METABOLIC PANEL
ALT: 23 U/L (ref 0–44)
AST: 19 U/L (ref 15–41)
Albumin: 3.3 g/dL — ABNORMAL LOW (ref 3.5–5.0)
Alkaline Phosphatase: 65 U/L (ref 38–126)
Anion gap: 11 (ref 5–15)
BUN: 7 mg/dL (ref 6–20)
CO2: 21 mmol/L — ABNORMAL LOW (ref 22–32)
Calcium: 8.5 mg/dL — ABNORMAL LOW (ref 8.9–10.3)
Chloride: 106 mmol/L (ref 98–111)
Creatinine, Ser: 0.62 mg/dL (ref 0.44–1.00)
GFR calc Af Amer: >60 mL/min (ref >60)
GFR calc non Af Amer: >60 mL/min (ref >60)
Glucose, Bld: 185 mg/dL — ABNORMAL HIGH (ref 70–99)
Potassium: 3.7 mmol/L (ref 3.5–5.1)
Sodium: 138 mmol/L (ref 135–145)
Total Bilirubin: 0.7 mg/dL (ref 0.3–1.2)
Total Protein: 7.2 g/dL (ref 6.5–8.1)

## 2018-08-30 LAB — LIPASE, BLOOD: Lipase: 48 U/L (ref 11–51)

## 2018-08-30 LAB — PREGNANCY, URINE: Preg Test, Ur: NEGATIVE

## 2018-08-30 LAB — SARS CORONAVIRUS 2 BY RT PCR (HOSPITAL ORDER, PERFORMED IN ~~LOC~~ HOSPITAL LAB): SARS Coronavirus 2: POSITIVE — AB

## 2018-08-30 MED ORDER — MECLIZINE HCL 25 MG PO TABS
50.0000 mg | ORAL_TABLET | Freq: Once | ORAL | Status: AC
  Administered 2018-08-30: 15:00:00 50 mg via ORAL
  Filled 2018-08-30: qty 2

## 2018-08-30 MED ORDER — LIDOCAINE VISCOUS HCL 2 % MT SOLN
15.0000 mL | Freq: Once | OROMUCOSAL | Status: AC
  Administered 2018-08-30: 13:00:00 15 mL via ORAL
  Filled 2018-08-30: qty 15

## 2018-08-30 MED ORDER — SODIUM CHLORIDE 0.9 % IV BOLUS (SEPSIS)
1000.0000 mL | Freq: Once | INTRAVENOUS | Status: AC
  Administered 2018-08-30: 13:00:00 1000 mL via INTRAVENOUS

## 2018-08-30 MED ORDER — ALUM & MAG HYDROXIDE-SIMETH 200-200-20 MG/5ML PO SUSP
30.0000 mL | Freq: Once | ORAL | Status: AC
  Administered 2018-08-30: 13:00:00 30 mL via ORAL
  Filled 2018-08-30: qty 30

## 2018-08-30 NOTE — Discharge Instructions (Signed)
Based on review of vitals, medical screening exam, lab work and/or imaging, there does not appear to be an acute, emergent etiology for the patient's symptoms. Counseled pt on good return precautions and encouraged both PCP and ED follow-up as needed.  Prior to discharge, I also discussed incidental imaging findings with patient in detail and advised appropriate, recommended follow-up in detail.  Clinical Impression: 1. Coronavirus infection     Disposition: Discharge  Prior to providing a prescription for a controlled substance, I independently reviewed the patient's recent prescription history on the [place]. The patient had no recent or regular prescriptions and was deemed appropriate for a brief, less than 3 day prescription of narcotic for acute analgesia.  This note was prepared with assistance of Systems analyst. Occasional wrong-word or sound-a-like substitutions may have occurred due to the inherent limitations of voice recognition software.

## 2018-08-30 NOTE — ED Triage Notes (Signed)
Pt reports since Monday she has had emesis with any oral intact along with dizziness. Pt reports chest pain with deep breaths and feeling sob. Pt also has non productive cough.

## 2018-08-30 NOTE — Telephone Encounter (Signed)
Patient called, complaining of coughing, shortness of breath, nausea and vomiting since Monday. I advised she will need to go to ER for evaluation asap. Patient verbalized understanding. Thanks!

## 2018-08-30 NOTE — ED Notes (Signed)
Patient ambulatory to bathroom with steady gait at this time 

## 2018-08-30 NOTE — ED Provider Notes (Signed)
[facility] EMERGENCY DEPARTMENT Provider Note   CSN: [id number] Arrival date & time: 08/30/18  1123    History   Chief Complaint Chief Complaint  Patient presents with  . Emesis  . Dizziness    HPI Harlyn Rathmann is a 43 y.o. female.     Patient is a 43 year old female with past medical history of migraine headaches, anemia, prediabetes, vertigo who presents the emergency department for multiple symptoms.  Patient reports that she has been having some GI problems for the last several months and is scheduled for an endoscopy in June.  Reports however, that the symptoms began to get worse on 2022/07/06.  Reports that she has had increased nausea and vomiting especially when she eats.  Reports that she has not had a couple episodes of nonbloody diarrhea.  Reports that she has also developed a cough over the last week.  Denies any fever, chills, shortness of breath.  Reports that yesterday she had 1 instance of left-sided chest pain which she describes as stabbing but this resolved on its own.  She has not tried any medication for relief.     Past Medical History:  Diagnosis Date  . Anemia   . Asthma   . Common migraine with intractable migraine 06/28/2017  . GERD (gastroesophageal reflux disease)    pos H pylori  . IBS (irritable bowel syndrome) 07-06-2003  . Neonatal death    Vaginal delivery, full term-lived x2 hours.   . Palpitations   . Shortness of breath   . Spinal headache   . Valvular heart disease     Patient Active Problem List   Diagnosis Date Noted  . Common migraine with intractable migraine 06/28/2017  . Chest pain at rest 10/17/2016  . Sinus tachycardia 08/18/2016  . Prolonged Q-T interval on ECG 08/18/2016  . Vertigo 07/22/2015  . Abdominal discomfort 02/14/2015  . Low back pain 02/14/2015  . Generalized abdominal pain 02/01/2015  . Nausea 02/01/2015  . Environmental allergies 02/01/2015  . Language barrier to communication 02/01/2015  . Anemia  01/23/2015  . Prediabetes 01/23/2015  . Constipation 01/21/2015  . Knee pain, bilateral 01/21/2015  . Previous cesarean delivery affecting pregnancy 12/11/2013  . Palpitations 09/18/2013  . Shortness of breath 09/18/2013  . Previous cesarean delivery, delivered, with or without mention of antepartum condition 06/19/2013  . Advanced maternal age in pregnancy in second trimester 06/19/2013  . IBS (irritable bowel syndrome) 04/13/2003    Past Surgical History:  Procedure Laterality Date  . ADENOIDECTOMY     and tonsils (as a child)  . boil  2001/07/05   right elbow  . CESAREAN SECTION     x4  . CESAREAN SECTION  06/02/2011   Procedure: CESAREAN SECTION;  Surgeon: [name], MD;  Location: [facility];  Service: Gynecology;  Laterality: N/A;  Primary Cesarean Section Delivery Baby Boy @ 0004, Apgars 9/9  . CESAREAN SECTION N/A 12/10/2013   Procedure: REPEAT CESAREAN SECTION;  Surgeon: [name], MD;  Location: [facility];  Service: Obstetrics;  Laterality: N/A;  . OTHER SURGICAL HISTORY     Uterine surgery   . VAGINA SURGERY  07/06/03   uterine surgery     OB History    Gravida  5   Para  5   Term  5   Preterm      AB      Living  4     SAB      TAB  Ectopic      Multiple      Live Births  5            Home Medications    Prior to Admission medications   Medication Sig Start Date End Date Taking? Authorizing Provider  acetaminophen (TYLENOL) 500 MG tablet Take 1 tablet (500 mg total) by mouth every 6 (six) hours as needed. 01/21/15   Dorena Dew, FNP  albuterol (PROVENTIL HFA;VENTOLIN HFA) 108 (90 Base) MCG/ACT inhaler Inhale 2 puffs into the lungs every 6 (six) hours as needed for wheezing or shortness of breath. 05/03/18   Lanae Boast, FNP  benzonatate (TESSALON) 100 MG capsule Take 1 capsule (100 mg total) by mouth 2 (two) times daily as needed for cough. 05/03/18   Lanae Boast, [place]  Blood Pressure Monitoring DEVI 1 each by Does not apply route  daily. 02/01/18   Lanae Boast, FNP  dicyclomine (BENTYL) 10 MG capsule Take 1 capsule (10 mg total) by mouth every 8 (eight) hours as needed for spasms. 08/24/18   Armbruster, Carlota Raspberry, MD  Erenumab-aooe (AIMOVIG) 140 MG/ML SOAJ Inject 140 mg into the skin every 30 (thirty) days. 08/03/18   Kathrynn Ducking, MD  ergocalciferol (VITAMIN D2) 1.25 MG (50000 UT) capsule Take 50,000 Units by mouth once a week.    [provider]  etonogestrel (NEXPLANON) 68 MG IMPL implant 1 each by Subdermal route once.    [provider]  ferrous sulfate (FEROSUL) 325 (65 FE) MG tablet TAKE 1 TABLET (325 MG TOTAL) BY MOUTH DAILY. 06/14/18   Lanae Boast, FNP  fluticasone (FLONASE) 50 MCG/ACT nasal spray Place 2 sprays into both nostrils daily. 08/28/18   Lanae Boast, FNP  fluticasone furoate-vilanterol (BREO ELLIPTA) 100-25 MCG/INH AEPB Inhale 1 puff into the lungs daily. 05/03/18   Lanae Boast, FNP  gabapentin (NEURONTIN) 300 MG capsule One capsule in the morning and one in the evening for 1 week, then take one capsule in the morning and 2 in the evening 08/28/18   Kathrynn Ducking, MD  hydrOXYzine (ATARAX/VISTARIL) 10 MG tablet Take 1 tablet (10 mg total) by mouth 3 (three) times daily as needed. 06/14/18   Lanae Boast, FNP  ipratropium (ATROVENT) 0.06 % nasal spray Place 2 sprays into both nostrils as needed for rhinitis.    [provider]  levocetirizine (XYZAL) 5 MG tablet Take 1 tablet (5 mg total) by mouth every evening. 08/28/18   Lanae Boast, FNP  methylcellulose (CITRUCEL) oral powder Take as directed, daily 08/24/18   Armbruster, Carlota Raspberry, MD  metoprolol succinate (TOPROL XL) 100 MG 24 hr tablet Take 1 tablet (100 mg total) by mouth daily. Take with or immediately following a meal. 08/02/18   Lanae Boast, FNP  Multiple Vitamin (MULTI VITAMIN DAILY PO) Take by mouth.    [provider]  norgestimate-ethinyl estradiol (Lindsay 28) 0.25-35 MG-MCG tablet Take 1  tablet by mouth daily. 08/28/18   Lanae Boast, FNP  ondansetron (ZOFRAN ODT) 4 MG disintegrating tablet Take 1 tablet (4 mg total) by mouth every 8 (eight) hours as needed for nausea or vomiting. 08/24/18   Armbruster, Carlota Raspberry, MD  pantoprazole (PROTONIX) 40 MG tablet Take 1 tablet (40 mg total) by mouth 2 (two) times daily before a meal. 02/27/18   Armbruster, Carlota Raspberry, MD  potassium chloride SA (K-DUR,KLOR-CON) 20 MEQ tablet Take 1 tablet (20 mEq total) by mouth daily. 06/14/18   Lanae Boast, FNP  rizatriptan (MAXALT) 10 MG tablet Take  1 tablet (10 mg total) by mouth 3 (three) times daily as needed for migraine. May repeat in 2 hours if needed 08/28/18   Kathrynn Ducking, MD  sucralfate (CARAFATE) 1 GM/10ML suspension Take 10 mLs (1 g total) by mouth every 6 (six) hours as needed. 08/24/18   Yetta Flock, MD  venlafaxine XR (EFFEXOR XR) 37.5 MG 24 hr capsule 1 tablet daily for 1 week, then take 2 tablets daily 08/28/18   Kathrynn Ducking, MD    Family History Family History  Problem Relation Age of Onset  . Diabetes Mother   . Diabetes Father   . Diabetes Sister   . Anesthesia problems Neg Hx   . Colon cancer Neg Hx   . Esophageal cancer Neg Hx   . Rectal cancer Neg Hx   . Stomach cancer Neg Hx     Social History Social History   Tobacco Use  . Smoking status: Never Smoker  . Smokeless tobacco: Never Used  Substance Use Topics  . Alcohol use: No  . Drug use: No     Allergies   Topamax [topiramate]   Review of Systems Review of Systems  Constitutional: Positive for appetite change. Negative for chills, diaphoresis, fatigue, fever and unexpected weight change.  HENT: Negative for congestion and rhinorrhea.   Respiratory: Negative for choking, chest tightness, shortness of breath and stridor.   Cardiovascular: Positive for chest pain. Negative for palpitations and leg swelling.  Gastrointestinal: Positive for abdominal pain, diarrhea, nausea and vomiting. Negative  for anal bleeding, blood in stool and constipation.  Genitourinary: Negative for decreased urine volume, difficulty urinating, flank pain, pelvic pain and urgency.  Musculoskeletal: Negative for arthralgias and back pain.  Skin: Negative for rash and wound.  Neurological: Positive for dizziness and headaches. Negative for syncope, speech difficulty, weakness and light-headedness.  Psychiatric/Behavioral: The patient is not nervous/anxious.      Physical Exam Updated Vital Signs BP 125/74   Pulse 99   Temp 99.1 F (37.3 C) (Oral)   Resp 13   LMP 08/30/2018 (Exact Date)   SpO2 99%   Physical Exam Vitals signs and nursing note reviewed.  Constitutional:      General: She is not in acute distress.    Appearance: Normal appearance. She is not ill-appearing, toxic-appearing or diaphoretic.  HENT:     Head: Normocephalic.     Right Ear: Tympanic membrane normal.     Left Ear: Tympanic membrane normal.     Mouth/Throat:     Mouth: Mucous membranes are moist.     Pharynx: Oropharynx is clear.  Eyes:     Conjunctiva/sclera: Conjunctivae normal.  Cardiovascular:     Rate and Rhythm: Normal rate and regular rhythm.  Pulmonary:     Effort: Pulmonary effort is normal. No respiratory distress.  Abdominal:     General: Abdomen is flat. Bowel sounds are normal.     Tenderness: There is abdominal tenderness (Epigastric).  Skin:    General: Skin is dry.  Neurological:     General: No focal deficit present.     Mental Status: She is alert.  Psychiatric:        Mood and Affect: Mood normal.      ED Treatments / Results  Labs (all labs ordered are listed, but only abnormal results are displayed) Labs Reviewed  SARS CORONAVIRUS 2 ([facility]) - Abnormal; Notable for the following components:      Result Value  SARS Coronavirus 2 POSITIVE (*)    All other components within normal limits  URINALYSIS, ROUTINE W REFLEX MICROSCOPIC -  Abnormal; Notable for the following components:   Hgb urine dipstick LARGE (*)    Protein, ur 30 (*)    RBC / HPF >50 (*)    Bacteria, UA RARE (*)    All other components within normal limits  COMPREHENSIVE METABOLIC PANEL - Abnormal; Notable for the following components:   CO2 21 (*)    Glucose, Bld 185 (*)    Calcium 8.5 (*)    Albumin 3.3 (*)    All other components within normal limits  PREGNANCY, URINE  CBC WITH DIFFERENTIAL/PLATELET  LIPASE, BLOOD  TROPONIN I  I-STAT BETA HCG BLOOD, ED ([facility], [facility], [facility] ONLY)    EKG EKG Interpretation  Date/Time:  Wednesday Aug 30 2018 11:56:46 EDT Ventricular Rate:  96 PR Interval:    QRS Duration: 78 QT Interval:  454 QTC Calculation: 574 R Axis:   82 Text Interpretation:  Sinus rhythm Borderline T abnormalities, diffuse leads Prolonged QT interval Confirmed by Veryl Speak [phone]) on 08/30/2018 12:01:12 PM Also confirmed by Veryl Speak [phone]), editor Philomena Doheny [phone])  on 08/30/2018 1:53:08 PM   Radiology Dg Abdomen Acute W/chest  Result Date: 08/30/2018 CLINICAL DATA:  Chest pain, nausea and vomiting. EXAM: DG ABDOMEN ACUTE W/ 1V CHEST COMPARISON:  January 16, 2018 FINDINGS: There is no evidence of dilated bowel loops or free intraperitoneal air. No radiopaque calculi or other significant radiographic abnormality is seen. Heart size and mediastinal contours are within normal limits. Both lungs are clear. IMPRESSION: Negative abdominal radiographs.  No acute cardiopulmonary disease. Electronically Signed   By: Abelardo Diesel M.D.   On: 08/30/2018 12:42    Procedures Procedures (including critical care time)  Medications Ordered in ED Medications  sodium chloride 0.9 % bolus 1,000 mL (0 mLs Intravenous Stopped 08/30/18 1500)  alum & mag hydroxide-simeth (MAALOX/MYLANTA) 200-200-20 MG/5ML suspension 30 mL (30 mLs Oral Given 08/30/18 1241)    And  lidocaine (XYLOCAINE) 2 % viscous mouth solution 15 mL (15 mLs Oral Given 08/30/18 1240)   meclizine (ANTIVERT) tablet 50 mg (50 mg Oral Given 08/30/18 1516)     Initial Impression / Assessment and Plan / ED Course  I have reviewed the triage vital signs and the nursing notes.  Pertinent labs & imaging results that were available during my care of the patient were reviewed by me and considered in my medical decision making (see chart for details).  Clinical Course as of Aug 29 1816  Wed Aug 29, 5924  3573 43 year old patient presenting with multiple symptoms.  Vital signs normal upon arrival.  Work-up was initiated to include work-up for abdominal pain, nausea vomiting and chest pain.   [KM]  9323 I reassessed the patient at this time.  Her vitals appear normal and she is resting comfortable on the stretcher.  She reports that the GI cocktail has helped with her symptoms.  She does report that she feels mild dizziness.  History of vertigo in the past.  We will give her meclizine.   [KM]  5573 Patient's EKG, chest x-ray and abdominal x-ray were unremarkable.  Her CBC is completely normal.  No concerning findings with her metabolic panel or lipase or troponin.  Patient does have a rare amount of bacteria in her urine as well as some small amounts of red blood cells and hemoglobin, no flank pain to suggest kidney stone.   [KM]  1528 Patient's coronavirus test was positive.  Patient is not hypoxic, tachycardic or tachypneic.  She is afebrile and not septic.  She does not meet admission criteria.  I have given her strict return precautions.  I will treat her with some Tessalon Perles for her cough.  She was instructed on home quarantine process.   [KM]    Clinical Course User Index [KM] Alveria Apley, PA-C       Based on review of vitals, medical screening exam, lab work and/or imaging, there does not appear to be an acute, emergent etiology for the patient's symptoms. Counseled pt on good return precautions and encouraged both PCP and ED follow-up as needed.  Prior to discharge,  I also discussed incidental imaging findings with patient in detail and advised appropriate, recommended follow-up in detail.  Clinical Impression: 1. Coronavirus infection     Disposition: Discharge  Prior to providing a prescription for a controlled substance, I independently reviewed the patient's recent prescription history on the [place]. The patient had no recent or regular prescriptions and was deemed appropriate for a brief, less than 3 day prescription of narcotic for acute analgesia.  This note was prepared with assistance of Systems analyst. Occasional wrong-word or sound-a-like substitutions may have occurred due to the inherent limitations of voice recognition software.   Final Clinical Impressions(s) / ED Diagnoses   Final diagnoses:  Coronavirus infection    ED Discharge Orders    None       Alveria Apley, PA-C 08/30/18 1818    Veryl Speak, MD 08/30/18 [phone]

## 2018-09-05 MED FILL — ?RIZATRIPTAN 10 MG TABLET: 10 | 30 days supply | Qty: 10 | Fill #1

## 2018-09-05 MED FILL — METOPROLOL SUCCINATE ER 100: 100 | 30 days supply | Qty: 30 | Fill #1

## 2018-09-11 ENCOUNTER — Telehealth: Payer: Self-pay

## 2018-09-11 NOTE — Telephone Encounter (Signed)
Received fax from [place] stating [employer] patient assistance has been approved from 09/11/18- 09/11/19.

## 2018-09-18 ENCOUNTER — Telehealth: Payer: Self-pay | Admitting: *Deleted

## 2018-09-18 ENCOUNTER — Other Ambulatory Visit: Payer: Self-pay

## 2018-09-18 ENCOUNTER — Telehealth: Payer: Self-pay | Admitting: Gastroenterology

## 2018-09-18 NOTE — Telephone Encounter (Signed)
Message received from Rosanne Sack that the patient's COVID 19 test was positive. Called the patient to cancel in person interview. Patient informed that we will call back to reschedule. Patient verbalizing understanding. Note sent to  Dr Wilma Flavin regarding need to reschedule Colonoscopy and Previsit.

## 2018-09-18 NOTE — Telephone Encounter (Signed)
See TE with Rosanne Sack RN and Doyne Keel

## 2018-09-18 NOTE — Telephone Encounter (Signed)
Pt has an in-person pre op today at 10 AM.  Pt requested not to have an interpreter from her [place].  She was tested negative for COVID-19 and has no symptoms.

## 2018-09-19 NOTE — Telephone Encounter (Signed)
Thanks for letting me know, she tested positive on 5/20. Good question in regards to her colonoscopy on 6/17, I think this should probably be delayed but will let Dr. Ardis Hughs know.  Linna Hoff, curious how long after a positive Coronavirus test can we bring someone in for a procedure to the [facility], in regards to viral shedding. This patient tested positive on 5/20 and has a procedure scheduled 6/17. I was thinking may be best to delay another few weeks, not sure if we have had this situation yet with other patients

## 2018-09-19 NOTE — Telephone Encounter (Signed)
Thanks Linna Hoff, I agree.  Santiago Glad can you cancel this patient's colonoscopy on 6/17. Please tell her we need a few more weeks out from her coronavirus infection, can you reschedule her sometime in mid July or so if she is okay waiting that long, just taking extra-precautions.  Jan FYI, we will have an opening on 6/17 as this case is being postponed.

## 2018-09-19 NOTE — Telephone Encounter (Signed)
Betty Cain, We don't have policy on this yet but that is in development.  I would certainly err on the safe side and push the case out a few weeks if you think that is safe clinically.

## 2018-09-22 ENCOUNTER — Other Ambulatory Visit: Payer: Self-pay

## 2018-09-22 ENCOUNTER — Encounter: Payer: Self-pay | Admitting: Family Medicine

## 2018-09-22 ENCOUNTER — Ambulatory Visit (INDEPENDENT_AMBULATORY_CARE_PROVIDER_SITE_OTHER): Payer: Medicaid Other | Admitting: Family Medicine

## 2018-09-22 VITALS — BP 121/74 | HR 86 | Temp 99.6°F | Resp 16 | Ht <= 58 in | Wt 164.0 lb

## 2018-09-22 DIAGNOSIS — M545 Low back pain: Secondary | ICD-10-CM

## 2018-09-22 DIAGNOSIS — N10 Acute tubulo-interstitial nephritis: Secondary | ICD-10-CM

## 2018-09-22 DIAGNOSIS — U071 COVID-19: Secondary | ICD-10-CM

## 2018-09-22 DIAGNOSIS — Z789 Other specified health status: Secondary | ICD-10-CM

## 2018-09-22 LAB — POCT URINALYSIS DIPSTICK
Bilirubin, UA: NEGATIVE
Glucose, UA: NEGATIVE
Ketones, UA: NEGATIVE
Leukocytes, UA: NEGATIVE
Nitrite, UA: NEGATIVE
Protein, UA: POSITIVE — AB
Spec Grav, UA: 1.025 (ref 1.010–1.025)
Urobilinogen, UA: 0.2 E.U./dL
pH, UA: 5.5 (ref 5.0–8.0)

## 2018-09-22 MED ORDER — PHENAZOPYRIDINE HCL 200 MG PO TABS: 200.0000 mg | ORAL_TABLET | Freq: Three times a day (TID) | ORAL | 0 refills | Status: DC | PRN

## 2018-09-22 MED ORDER — CIPROFLOXACIN HCL 500 MG PO TABS: 500.0000 mg | ORAL_TABLET | Freq: Two times a day (BID) | ORAL | 0 refills | Status: AC

## 2018-09-22 MED FILL — CIPROFLOXACIN HCL 500 MG TA: 500 | 7 days supply | Qty: 14 | Fill #0

## 2018-09-22 NOTE — Patient Instructions (Signed)
Urinary Tract Infection, Adult A urinary tract infection (UTI) is an infection of any part of the urinary tract. The urinary tract includes:  The kidneys.  The ureters.  The bladder.  The urethra. These organs make, store, and get rid of pee (urine) in the body. What are the causes? This is caused by germs (bacteria) in your genital area. These germs grow and cause swelling (inflammation) of your urinary tract. What increases the risk? You are more likely to develop this condition if:  You have a small, thin tube (catheter) to drain pee.  You cannot control when you pee or poop (incontinence).  You are female, and: ? You use these methods to prevent pregnancy: ? A medicine that kills sperm (spermicide). ? A device that blocks sperm (diaphragm). ? You have low levels of a female hormone (estrogen). ? You are pregnant.  You have genes that add to your risk.  You are sexually active.  You take antibiotic medicines.  You have trouble peeing because of: ? A prostate that is bigger than normal, if you are female. ? A blockage in the part of your body that drains pee from the bladder (urethra). ? A kidney stone. ? A nerve condition that affects your bladder (neurogenic bladder). ? Not getting enough to drink. ? Not peeing often enough.  You have other conditions, such as: ? Diabetes. ? A weak disease-fighting system (immune system). ? Sickle cell disease. ? Gout. ? Injury of the spine. What are the signs or symptoms? Symptoms of this condition include:  Needing to pee right away (urgently).  Peeing often.  Peeing small amounts often.  Pain or burning when peeing.  Blood in the pee.  Pee that smells bad or not like normal.  Trouble peeing.  Pee that is cloudy.  Fluid coming from the vagina, if you are female.  Pain in the belly or lower back. Other symptoms include:  Throwing up (vomiting).  No urge to eat.  Feeling mixed up (confused).  Being tired  and grouchy (irritable).  A fever.  Watery poop (diarrhea). How is this treated? This condition may be treated with:  Antibiotic medicine.  Other medicines.  Drinking enough water. Follow these instructions at home:  Medicines  Take over-the-counter and prescription medicines only as told by your doctor.  If you were prescribed an antibiotic medicine, take it as told by your doctor. Do not stop taking it even if you start to feel better. General instructions  Make sure you: ? Pee until your bladder is empty. ? Do not hold pee for a long time. ? Empty your bladder after sex. ? Wipe from front to back after pooping if you are a female. Use each tissue one time when you wipe.  Drink enough fluid to keep your pee pale yellow.  Keep all follow-up visits as told by your doctor. This is important. Contact a doctor if:  You do not get better after 1-2 days.  Your symptoms go away and then come back. Get help right away if:  You have very bad back pain.  You have very bad pain in your lower belly.  You have a fever.  You are sick to your stomach (nauseous).  You are throwing up. Summary  A urinary tract infection (UTI) is an infection of any part of the urinary tract.  This condition is caused by germs in your genital area.  There are many risk factors for a UTI. These include having a small, thin   tube to drain pee and not being able to control when you pee or poop.  Treatment includes antibiotic medicines for germs.  Drink enough fluid to keep your pee pale yellow. This information is not intended to replace advice given to you by your health care provider. Make sure you discuss any questions you have with your health care provider. Document Released: 09/15/2007 Document Revised: 10/06/2017 Document Reviewed: 10/06/2017 Elsevier Interactive Patient Education  2019 Elsevier Inc.  

## 2018-09-22 NOTE — Progress Notes (Signed)
Patient [facility]   Progress Note: Sick Visit Provider: Lanae Boast, FNP  SUBJECTIVE:   Betty Cain is a 43 y.o. female who  has a past medical history of Anemia, Asthma, Common migraine with intractable migraine (06/28/2017), GERD (gastroesophageal reflux disease), IBS (irritable bowel syndrome) July 11, 2003), Neonatal death, Palpitations, Shortness of breath, Spinal headache, and Valvular heart disease.. Patient presents today for Back Pain (lower back pain ) and Follow-up (er asked her to follow up here for a problem with her lungs?) Patient seen in the ED on 08/30/2018 and was positive for COVID -19. She was instructed to self-quarantine and return if symptoms worsened. She states that she was informed that she needed to have repeat imaging due to a problem with her lungs. No record of this in her chart.  She has had improvement of respiratory symptoms.  Patient reports having difficulty with urination, pressure and hesitancy. She denies dysuria at the present time. Symptoms x 3 weeks.  Review of Systems  Constitutional: Negative.   HENT: Negative.   Eyes: Negative.   Respiratory: Negative.   Cardiovascular: Negative.   Gastrointestinal: Negative.   Genitourinary: Negative.   Musculoskeletal: Negative.   Skin: Negative.   Neurological: Negative.   Psychiatric/Behavioral: Negative.      OBJECTIVE: BP 121/74 (BP Location: Right Arm, Patient Position: Sitting, Cuff Size: Normal)   Pulse 86   Temp 99.6 F (37.6 C) (Oral)   Resp 16   Ht 4\' 10"  (1.473 m)   Wt 164 lb (74.4 kg)   LMP 08/30/2018 (Exact Date)   SpO2 100%   BMI 34.28 kg/m   Wt Readings from Last 3 Encounters:  09/22/18 164 lb (74.4 kg)  08/24/18 158 lb (71.7 kg)  05/03/18 163 lb (73.9 kg)     Physical Exam Vitals signs and nursing note reviewed.  Constitutional:      General: She is not in acute distress.    Appearance: Normal appearance.  HENT:     Head: Normocephalic  and atraumatic.  Eyes:     Extraocular Movements: Extraocular movements intact.     Conjunctiva/sclera: Conjunctivae normal.     Pupils: Pupils are equal, round, and reactive to light.  Cardiovascular:     Rate and Rhythm: Normal rate and regular rhythm.     Heart sounds: No murmur.  Pulmonary:     Effort: Pulmonary effort is normal.     Breath sounds: Normal breath sounds.  Musculoskeletal: Normal range of motion.  Skin:    General: Skin is warm and dry.  Neurological:     Mental Status: She is alert and oriented to person, place, and time.  Psychiatric:        Mood and Affect: Mood normal.        Behavior: Behavior normal.        Thought Content: Thought content normal.        Judgment: Judgment normal.     ASSESSMENT/PLAN:   1. Acute pyelonephritis Increase water intake. Culture ordered.  - Urinalysis Dipstick - ciprofloxacin (CIPRO) 500 MG tablet; Take 1 tablet (500 mg total) by mouth 2 (two) times daily for 7 days.  Dispense: 14 tablet; Refill: 0 - Urine Culture - phenazopyridine (PYRIDIUM) 200 MG tablet; Take 1 tablet (200 mg total) by mouth 3 (three) times daily as needed for pain.  Dispense: 10 tablet; Refill: 0   2. COVID-19 virus detected Resolved. Patient feeling better. Denies SOB.   3. Language barrier to communication  Interpreter services utilized throughout the encounter.       The patient was given clear instructions to go to ER or return to medical center if symptoms do not improve, worsen or new problems develop. The patient verbalized understanding and agreed with plan of care.   [name]. Nathaneil Canary, FNP-BC [facility] [address] [place], [place] [address] [phone]     This note has been created with Dragon speech recognition software and smart phrase technology. Any transcriptional errors are unintentional.

## 2018-09-24 LAB — URINE CULTURE

## 2018-09-27 ENCOUNTER — Encounter: Payer: Medicaid Other | Admitting: Gastroenterology

## 2018-10-03 ENCOUNTER — Ambulatory Visit: Payer: Self-pay | Attending: Family Medicine

## 2018-10-03 ENCOUNTER — Other Ambulatory Visit: Payer: Self-pay

## 2018-10-04 ENCOUNTER — Other Ambulatory Visit: Payer: Self-pay | Admitting: Gastroenterology

## 2018-10-04 MED FILL — ?PANTOPRAZOLE SO DR 40MG TA: 40 | 30 days supply | Qty: 60 | Fill #0

## 2018-10-04 MED FILL — FERROUS SULFATE 325 MG TAB: 325 (65 FE) | 30 days supply | Qty: 30 | Fill #2

## 2018-10-04 MED FILL — hydrOXYzine HCL 10 MG TABS: 10 | 10 days supply | Qty: 30 | Fill #2

## 2018-10-04 MED FILL — FLUTICASONE PROP 50 MCG SPR: 50 | 30 days supply | Qty: 16 | Fill #1

## 2018-10-04 MED FILL — LEVOCETIRIZINE 5 MG TABLET: 5 | 30 days supply | Qty: 30 | Fill #1

## 2018-10-04 MED FILL — !VENTOLIN HFA INHALER: 108 (90 BAS | 25 days supply | Qty: 18 | Fill #0

## 2018-10-04 MED FILL — ?METOPROLOL SUCC ER 100MG T: 100 | 30 days supply | Qty: 30 | Fill #2

## 2018-10-04 MED FILL — CARAFATE 1 GM/10 ML SUSP: 1 | 10 days supply | Qty: 420 | Fill #1

## 2018-10-04 MED FILL — ?RIZATRIPTAN 10 MG TABLET: 10 | 30 days supply | Qty: 10 | Fill #2

## 2018-10-12 MED FILL — VENLAFAXINE HCL ER 37.5 MG: 37.5 | 30 days supply | Qty: 60 | Fill #1

## 2018-10-12 MED FILL — METOPROLOL SUCCINATE ER 100: 100 | 30 days supply | Qty: 30 | Fill #3

## 2018-11-01 ENCOUNTER — Ambulatory Visit: Payer: Medicaid Other | Admitting: Family Medicine

## 2018-11-09 ENCOUNTER — Ambulatory Visit: Payer: Self-pay | Admitting: *Deleted

## 2018-11-09 ENCOUNTER — Other Ambulatory Visit: Payer: Self-pay

## 2018-11-09 VITALS — Temp 96.9°F | Ht <= 58 in | Wt 168.8 lb

## 2018-11-09 DIAGNOSIS — R194 Change in bowel habit: Secondary | ICD-10-CM

## 2018-11-09 DIAGNOSIS — R1084 Generalized abdominal pain: Secondary | ICD-10-CM

## 2018-11-09 MED ORDER — PLENVU 140 G PO SOLR: 1.0000 | ORAL | 0 refills | Status: DC

## 2018-11-09 MED ORDER — POLYETHYLENE GLYCOL 3350 17 GM/SCOOP PO POWD: 1.0000 | Freq: Once | ORAL | 0 refills | Status: AC

## 2018-11-09 MED ORDER — DULCOLAX 5 MG PO TBEC: 5.0000 mg | DELAYED_RELEASE_TABLET | Freq: Once | ORAL | 0 refills | Status: AC

## 2018-11-09 MED FILL — POLYETHYLENE GLYCOL 3350 PO: 17 | 1 days supply | Qty: 238 | Fill #0

## 2018-11-09 NOTE — Progress Notes (Signed)
No egg or soy allergy known to patient  No issues with past sedation with any surgeries  or procedures, no intubation problems  No diet pills per patient No home 02 use per patient  No blood thinners per patient  Pt states  issues with constipation - uses Citrucel as needed- stools are very hard per pt- has hard balls like rocks- 3-5 times a day but always hard - will do a 2 day Plenvu for pt for her colon - No A fib or A flutter  EMMI video sent to pt's e mail   In person pre visit with interpreter and pt - all wearing mask in PV today as well as nurse -pt temp 96.9- interpreter temp 96.6- - we discussed prep instructions- pt verbalized understanding thru interpreter- she states she does have someone in her home that reads/ speaks English to help with instructions   Pt is aware that care partner will wait in the car during procedure; if they feel like they will be too hot to wait in the car; they may wait in the lobby.  We want them to wear a mask (we do not have any that we can provide them), practice social distancing, and we will check their temperatures when they get here.  I did remind patient that their care partner needs to stay in the parking lot the entire time. Pt will wear mask into building.

## 2018-11-13 ENCOUNTER — Encounter: Payer: Self-pay | Admitting: Family Medicine

## 2018-11-13 ENCOUNTER — Ambulatory Visit (INDEPENDENT_AMBULATORY_CARE_PROVIDER_SITE_OTHER): Payer: Self-pay | Admitting: Family Medicine

## 2018-11-13 ENCOUNTER — Other Ambulatory Visit: Payer: Self-pay

## 2018-11-13 VITALS — BP 121/69 | HR 86 | Temp 99.0°F | Resp 14 | Ht <= 58 in | Wt 168.0 lb

## 2018-11-13 DIAGNOSIS — E876 Hypokalemia: Secondary | ICD-10-CM

## 2018-11-13 DIAGNOSIS — Z789 Other specified health status: Secondary | ICD-10-CM

## 2018-11-13 DIAGNOSIS — G43019 Migraine without aura, intractable, without status migrainosus: Secondary | ICD-10-CM

## 2018-11-13 DIAGNOSIS — M791 Myalgia, unspecified site: Secondary | ICD-10-CM

## 2018-11-13 MED ORDER — POTASSIUM CHLORIDE 20 MEQ/15ML (10%) PO SOLN: 20.0000 meq | Freq: Every day | ORAL | 2 refills | Status: DC

## 2018-11-13 MED FILL — DICYCLOMINE 10 MG CAPSULE: 10 | 30 days supply | Qty: 90 | Fill #1

## 2018-11-13 MED FILL — !BREO ELLIPTA 100-25 MCG IN: 100-25 | 30 days supply | Qty: 60 | Fill #3

## 2018-11-13 MED FILL — ?METOPROLOL SUCC ER 100MG T: 100 | 30 days supply | Qty: 30 | Fill #4

## 2018-11-13 MED FILL — ?RIZATRIPTAN 10 MG TABLET: 10 | 30 days supply | Qty: 10 | Fill #3

## 2018-11-13 MED FILL — ONDANSETRON ODT 4 MG TABLET: 4 | 20 days supply | Qty: 60 | Fill #1

## 2018-11-13 MED FILL — FLUTICASONE PROP 50 MCG SPR: 50 | 30 days supply | Qty: 16 | Fill #2

## 2018-11-13 MED FILL — LEVOCETIRIZINE 5 MG TABLET: 5 | 30 days supply | Qty: 30 | Fill #2

## 2018-11-13 MED FILL — ?PANTOPRAZOLE SO DR 40MG TA: 40 | 30 days supply | Qty: 60 | Fill #1

## 2018-11-13 MED FILL — FERROUS SULFATE 325 MG TAB: 325 (65 FE) | 30 days supply | Qty: 30 | Fill #3

## 2018-11-13 MED FILL — GABAPENTIN 300 MG CAPSULE: 300 | 30 days supply | Qty: 90 | Fill #1

## 2018-11-13 NOTE — Patient Instructions (Signed)
Somatic Symptom Disorder Somatic symptom disorder is a mental health condition. People with this disorder have physical (somatic) symptoms that cause distress or affect daily living. However, no other medical condition can be found to explain these symptoms. Somatic symptom disorder may interfere with relationships, work, school, or other daily activities. It may lead to frequent medical visits and many medical tests to try to find the cause of symptoms. It may also lead to surgical procedures that do not help and can cause serious problems. People with somatic symptom disorder are also at risk for alcohol or drug addiction, suicide attempts, and divorce. Somatic symptom disorder may start in childhood but is most common in young adults. The disorder may be triggered by stressful life events. It may last for several years or may come and go throughout life. What are the causes? The exact cause of this condition is often unknown. What increases the risk? The following factors may make you more likely to develop this condition:  Being female. The disorder is more common in females than males.  Having a history of childhood abuse.  Having a history of alcohol or substance abuse.  Having family members with the disorder.  Having other mental health conditions, including personality disorders. What are the signs or symptoms? You may have somatic symptoms that cannot be explained by a medical condition. Examples of somatic symptoms may include:  Pain. This may be the only physical symptom. Pain may involve any part of the body.  Stomach or intestinal symptoms. These may include nausea, indigestion, diarrhea, or abdominal pain.  Other non-specific symptoms such as fatigue, trouble sleeping, headaches, or dizziness. How is this diagnosed? You may be diagnosed with this condition if:  You have one or more somatic symptoms that cause you distress or affect your daily living.  You react to the  somatic symptoms in a way that is out of proportion to the symptoms. The reaction may include: ? Thinking all the time about the severity of the symptoms. ? Feeling very anxious all the time about the symptoms or your general health. ? Spending a lot of time and energy dealing with the symptoms or health concerns.  You have somatic symptoms either continuously or on and off for at least 6 months. Exams and tests will be done to rule out serious physical health problems. After that, your health care provider may refer you to a mental health specialist for psychological evaluation. Somatic symptoms can be related to a number of mental health conditions. How is this treated? This condition is treated with one or more of the following:  Regular follow-up visits with your health care provider for evaluation and reassurance.  Counseling or talk therapy. This involves working with a mental health specialist to: ? Help you understand what triggers your symptoms. ? Help you learn some coping skills to deal with your symptoms.  Medicine. Certain medicines can help with severe anxiety or depression caused by this disorder.  Healthy lifestyle. A balanced diet and regular exercise can reduce stress and somatic symptoms. Follow these instructions at home: Medicines  Take over-the-counter and prescription medicines only as told by your health care provider. Eating and drinking  Eat a healthy diet that includes plenty of vegetables, fruits, whole grains, low-fat dairy products, and lean protein. Do not eat a lot of foods that are high in solid fats, added sugars, or salt. General instructions  Do not use any products that contain nicotine or tobacco, such as cigarettes and e-cigarettes.   If you need help quitting, ask your health care provider.  Do not use illegal drugs. Do not misuse prescription medicines.  Do not drink alcohol if your health care provider tells you not to.  Get regular exercise.  Most adults should exercise for at least 150 minutes each week. Check with your health care provider before starting an exercise program.  Get the right amount and quality of sleep. Most adults need 7-9 hours of sleep each night.  Keep all follow-up visits as told by your health care provider. This is important. Contact a health care provider if:  Your pain or symptoms do not go away or they become severe.  You develop new symptoms. Get help right away if:  You have serious thoughts about hurting yourself or someone else. If you ever feel like you may hurt yourself or others, or have thoughts about taking your own life, get help right away. You can go to your nearest emergency department or call:  Your local emergency services (911 in the [place]  A suicide crisis helpline, such as the [facility] at 2507535282. This is open 24 hours a day. Summary  People with somatic symptom disorder have physical symptoms that cause distress or affect daily living. However, no other medical condition can be found to explain these symptoms.  Somatic symptom disorder may interfere with relationships, work, school, or other daily activities. It may lead to frequent medical visits and many medical tests to try to find the cause of symptoms.  Exams and tests will be done to rule out serious physical health problems. After that, your health care provider may refer you to a mental health specialist for psychological evaluation. This information is not intended to replace advice given to you by your health care provider. Make sure you discuss any questions you have with your health care provider. Document Released: 05/01/2010 Document Revised: 05/10/2017 Document Reviewed: 05/10/2017 Elsevier Patient Education  2020 [employer].

## 2018-11-13 NOTE — Progress Notes (Signed)
Patient Betty Cain   Progress Note: General Provider: Lanae Boast, FNP  SUBJECTIVE:   Betty Cain is a 43 y.o. female who  has a past medical history of Allergy, Anemia, Asthma, Common migraine with intractable migraine (06/28/2017), GERD (gastroesophageal reflux disease), Hypertension, IBS (irritable bowel syndrome) 08-01-03), Neonatal death, Palpitations, Shortness of breath, Spinal headache, and Valvular heart disease.. Patient presents today for Follow-up (3 month follow up on sinus tachycardia), Pain (patient states "all over body" ), Headache, and Medication Problem (asking for the postassium to be changed. She does not like the pill) Patient is followed by cardiology, neurology and gastroenterology. She states that she is unsure as the cause of her pain. Colonoscopy re-scheduled due to her being COVID positive in June 2020. She was started on Effexor by neurology and reports compliance with this medication.  She does have stress at home.  She is the mother of 4 children.  She states that she has been trying to control her worry.  She also is asking for her potassium to be changed from a pill to a liquid.  Review of Systems  Constitutional: Negative.   HENT: Negative.   Eyes: Negative.   Respiratory: Negative.   Cardiovascular: Negative.   Gastrointestinal: Negative.   Genitourinary: Negative.   Musculoskeletal: Positive for myalgias.  Skin: Negative.   Neurological: Negative.   Psychiatric/Behavioral: Negative.      OBJECTIVE: BP 121/69 (BP Location: Left Arm, Patient Position: Sitting, Cuff Size: Normal)   Pulse 86   Temp 99 F (37.2 C) (Oral)   Resp 14   Ht 4\' 10"  (1.473 m)   Wt 168 lb (76.2 kg)   SpO2 100%   BMI 35.11 kg/m   Wt Readings from Last 3 Encounters:  11/13/18 168 lb (76.2 kg)  11/09/18 168 lb 12.8 oz (76.6 kg)  09/22/18 164 lb (74.4 kg)     Physical Exam Vitals signs and nursing note reviewed.  Constitutional:       General: She is not in acute distress.    Appearance: Normal appearance.  HENT:     Head: Normocephalic and atraumatic.  Eyes:     Extraocular Movements: Extraocular movements intact.     Conjunctiva/sclera: Conjunctivae normal.     Pupils: Pupils are equal, round, and reactive to light.  Cardiovascular:     Rate and Rhythm: Normal rate and regular rhythm.     Heart sounds: No murmur.  Pulmonary:     Effort: Pulmonary effort is normal.     Breath sounds: Normal breath sounds.  Musculoskeletal: Normal range of motion.  Skin:    General: Skin is warm and dry.  Neurological:     Mental Status: She is alert and oriented to person, place, and time.  Psychiatric:        Mood and Affect: Mood normal.        Behavior: Behavior normal.        Thought Content: Thought content normal.        Judgment: Judgment normal.     ASSESSMENT/PLAN:  1. Common migraine with intractable migraine Patient is followed by neurology for this. Will continue to monitor.   2. Myalgia Patient currently on Effexor. Considered TCA for all over body pain, however patient with a history of QT prolongation. Adding TCA could increase this risk and the risk of serotonin syndrome. Patient to keep log of aggravating and alleviating factors. Could also consider somatization.  We discussed somatization in detail.  3. Language barrier to communication Interpreter services used throughout the encounter.  Return in about 3 months (around 02/13/2019) for f/u .  Time Spent: 25 minutes face-to-face with this patient discussing problems, treatments, and answering patient's questions.    The patient was given clear instructions to go to ER or return to medical center if symptoms do not improve, worsen or new problems develop. The patient verbalized understanding and agreed with plan of Cain.   Betty Cain. Betty Canary, FNP-BC [facility] [address] [place], [place] [address]  [phone]

## 2018-11-14 LAB — COMPREHENSIVE METABOLIC PANEL
ALT: 15 IU/L (ref 0–32)
AST: 21 IU/L (ref 0–40)
Albumin/Globulin Ratio: 1.5 (ref 1.2–2.2)
Albumin: 4.5 g/dL (ref 3.8–4.8)
Alkaline Phosphatase: 92 IU/L (ref 39–117)
BUN/Creatinine Ratio: 12 (ref 9–23)
BUN: 8 mg/dL (ref 6–24)
Bilirubin Total: <0.2 mg/dL (ref 0.0–1.2)
CO2: 21 mmol/L (ref 20–29)
Calcium: 9.7 mg/dL (ref 8.7–10.2)
Chloride: 100 mmol/L (ref 96–106)
Creatinine, Ser: 0.65 mg/dL (ref 0.57–1.00)
GFR calc Af Amer: 128 mL/min/{1.73_m2} (ref >59)
GFR calc non Af Amer: 111 mL/min/{1.73_m2} (ref >59)
Globulin, Total: 3 g/dL (ref 1.5–4.5)
Glucose: 235 mg/dL — ABNORMAL HIGH (ref 65–99)
Potassium: 4.5 mmol/L (ref 3.5–5.2)
Sodium: 137 mmol/L (ref 134–144)
Total Protein: 7.5 g/dL (ref 6.0–8.5)

## 2018-11-14 LAB — TSH: TSH: 2.23 u[IU]/mL (ref 0.450–4.500)

## 2018-11-15 MED FILL — POTASSIUM CL 10% (20 MEQ/15: 20 MEQ/15ML | 31 days supply | Qty: 473 | Fill #0

## 2018-11-22 ENCOUNTER — Telehealth: Payer: Self-pay

## 2018-11-22 NOTE — Telephone Encounter (Signed)
-----   Message from Yetta Flock, MD sent at 11/22/2018 10:20 AM EDT ----- Regarding: FW: overnight call on your patient Sherlynn Stalls can you please contact this patient and see how she is doing with the prep, as outlined below. Hopefully she is okay and can get the prep done to proceed for her procedure tomorrow. Thanks ----- Message ----- From: Doran Stabler, MD Sent: 11/22/2018   7:46 AM EDT To: Yetta Flock, MD Subject: overnight call on your patient                 Naoma Diener a call last eve from this patient re: prep problem. Speaks limited English (Arabic) and difficult to communicate.  Vomited miralax , sounds like was drinking pretty quickly.  Then I was able to determine she must be on a 2 day prep, since she has Plenvu to take today for colon 8/13.  Just recommended more water last eve, then prep as scheduled today. Might be helpful for nurse/CMA to call her mid AM after her morning prep dose to make sure she is having results.  Good luck.  - HD

## 2018-11-22 NOTE — Telephone Encounter (Signed)
Called patient via interpreter: Betty Cain [phone]. Went though the entire prep instructions with her and answered questions. Patient isn't throwing up this morning, but she thinks she threw up all the Miralax last night. She has had a BM

## 2018-11-23 ENCOUNTER — Encounter: Payer: Self-pay | Admitting: Gastroenterology

## 2018-11-23 ENCOUNTER — Other Ambulatory Visit: Payer: Self-pay

## 2018-11-23 ENCOUNTER — Ambulatory Visit (AMBULATORY_SURGERY_CENTER): Payer: Medicaid Other | Admitting: Gastroenterology

## 2018-11-23 VITALS — BP 122/80 | HR 95 | Temp 98.6°F | Resp 12 | Ht <= 58 in | Wt 168.0 lb

## 2018-11-23 DIAGNOSIS — R194 Change in bowel habit: Secondary | ICD-10-CM

## 2018-11-23 DIAGNOSIS — D123 Benign neoplasm of transverse colon: Secondary | ICD-10-CM

## 2018-11-23 DIAGNOSIS — K648 Other hemorrhoids: Secondary | ICD-10-CM

## 2018-11-23 DIAGNOSIS — R1084 Generalized abdominal pain: Secondary | ICD-10-CM

## 2018-11-23 DIAGNOSIS — K635 Polyp of colon: Secondary | ICD-10-CM

## 2018-11-23 HISTORY — PX: OTHER SURGICAL HISTORY: SHX169

## 2018-11-23 MED ORDER — SODIUM CHLORIDE 0.9 % IV SOLN: 500.0000 mL | INTRAVENOUS | Status: DC

## 2018-11-23 NOTE — Patient Instructions (Signed)
? Follow up in office in the next few weeks to discuss further options, trial of TCA?  Recommending sleep study to rule out sleep apnea?  Miralax daily as needed  YOU HAD AN ENDOSCOPIC PROCEDURE TODAY AT [facility]:   Refer to the procedure report that was given to you for any specific questions about what was found during the examination.  If the procedure report does not answer your questions, please call your gastroenterologist to clarify.  If you requested that your care partner not be given the details of your procedure findings, then the procedure report has been included in a sealed envelope for you to review at your convenience later.  YOU SHOULD EXPECT: Some feelings of bloating in the abdomen. Passage of more gas than usual.  Walking can help get rid of the air that was put into your GI tract during the procedure and reduce the bloating. If you had a lower endoscopy (such as a colonoscopy or flexible sigmoidoscopy) you may notice spotting of blood in your stool or on the toilet paper. If you underwent a bowel prep for your procedure, you may not have a normal bowel movement for a few days.  Please Note:  You might notice some irritation and congestion in your nose or some drainage.  This is from the oxygen used during your procedure.  There is no need for concern and it should clear up in a day or so.  SYMPTOMS TO REPORT IMMEDIATELY:   Following lower endoscopy (colonoscopy or flexible sigmoidoscopy):  Excessive amounts of blood in the stool  Significant tenderness or worsening of abdominal pains  Swelling of the abdomen that is new, acute  Fever of 100F or higher  For urgent or emergent issues, a gastroenterologist can be reached at any hour by calling [phone].   DIET:  We do recommend a small meal at first, but then you may proceed to your regular diet.  Drink plenty of fluids but you should avoid alcoholic beverages for 24 hours.  ACTIVITY:  You should  plan to take it easy for the rest of today and you should NOT DRIVE or use heavy machinery until tomorrow (because of the sedation medicines used during the test).    FOLLOW UP: Our staff will call the number listed on your records 48-72 hours following your procedure to check on you and address any questions or concerns that you may have regarding the information given to you following your procedure. If we do not reach you, we will leave a message.  We will attempt to reach you two times.  During this call, we will ask if you have developed any symptoms of COVID 19. If you develop any symptoms (ie: fever, flu-like symptoms, shortness of breath, cough etc.) before then, please call [phone].  If you test positive for Covid 19 in the 2 weeks post procedure, please call and report this information to [place].    If any biopsies were taken you will be contacted by phone or by letter within the next 1-3 weeks.  Please call us at [phone] if you have not heard about the biopsies in 3 weeks.    SIGNATURES/CONFIDENTIALITY: You and/or your care partner have signed paperwork which will be entered into your electronic medical record.  These signatures attest to the fact that that the information above on your After Visit Summary has been reviewed and is understood.  Full responsibility of the confidentiality of this discharge information lies with  you and/or your care-partner.

## 2018-11-23 NOTE — Progress Notes (Signed)
Report given to PACU, vss 

## 2018-11-23 NOTE — Progress Notes (Signed)
Called to room to assist during endoscopic procedure.  Patient ID and intended procedure confirmed with present staff. Received instructions for my participation in the procedure from the performing physician.  

## 2018-11-23 NOTE — Op Note (Signed)
[place] Patient Name: Betty Cain Procedure Date: 11/23/2018 10:00 AM MRN: [id number] Endoscopist: Remo Lipps P. Havery Moros , MD Age: 43 Referring MD:  Date of Birth: 08-26-1975 Gender: Female Account #: [id number] Procedure:                Colonoscopy Indications:              Generalized abdominal pain, Change in bowel habits,                            no clear cause on prior imaging, EGD, labs.                            Elevated ESR. Medicines:                Monitored Anesthesia Care Procedure:                Pre-Anesthesia Assessment:                           - Prior to the procedure, a History and Physical                            was performed, and patient medications and                            allergies were reviewed. The patient's tolerance of                            previous anesthesia was also reviewed. The risks                            and benefits of the procedure and the sedation                            options and risks were discussed with the patient.                            All questions were answered, and informed consent                            was obtained. Prior Anticoagulants: The patient has                            taken no previous anticoagulant or antiplatelet                            agents. ASA Grade Assessment: III - A patient with                            severe systemic disease. After reviewing the risks                            and benefits, the patient was deemed in  satisfactory condition to undergo the procedure.                           After obtaining informed consent, the colonoscope                            was passed under direct vision. Throughout the                            procedure, the patient's blood pressure, pulse, and                            oxygen saturations were monitored continuously. The                            Colonoscope was introduced through the  anus and                            advanced to the the terminal ileum, with                            identification of the appendiceal orifice and IC                            valve. The colonoscopy was performed without                            difficulty. The patient tolerated the procedure                            well. The quality of the bowel preparation was                            good. The terminal ileum, ileocecal valve,                            appendiceal orifice, and rectum were photographed. Scope In: 10:03:06 AM Scope Out: 10:26:44 AM Scope Withdrawal Time: 0 hours 20 minutes 40 seconds  Total Procedure Duration: 0 hours 23 minutes 38 seconds  Findings:                 The perianal and digital rectal examinations were                            normal.                           The terminal ileum appeared normal.                           The left colon was extremely tortuous.                           A 3 mm polyp was found in the transverse colon. The  polyp was sessile. The polyp was removed with a                            cold snare. Resection and retrieval were complete.                           Internal hemorrhoids were found during retroflexion.                           The exam was otherwise without abnormality. No                            obvious inflammatory changes. Of note, given the                            breathing pattern observed under anesthesia for                            this case, high suspicion for sleep apnea. Complications:            No immediate complications. Estimated blood loss:                            Minimal. Estimated Blood Loss:     Estimated blood loss was minimal. Impression:               - The examined portion of the ileum was normal.                           - Tortuous colon.                           - One 3 mm polyp in the transverse colon, removed                            with a  cold snare. Resected and retrieved.                           - Internal hemorrhoids.                           - The examination was otherwise normal.                           No obvious cause for abdominal pain on this exam.                            Etiology remains unclear, could be functional                            however with elevated inflammatory markers will                            need to consider alternative etiologies. Recommendation:           - Patient has a  contact number available for                            emergencies. The signs and symptoms of potential                            delayed complications were discussed with the                            patient. Return to normal activities tomorrow.                            Written discharge instructions were provided to the                            patient.                           - Resume previous diet.                           - Continue present medications.                           - Await pathology results.                           - Follow up in the office with me in the next few                            weeks to discuss options for further workup and                            management (trial of TCA?)                           - Recommend sleep study to rule out sleep apnea                            given observed breathing pattern with anesthesia                            today Remo Lipps P. Ninnie Fein, MD 11/23/2018 10:32:27 AM This report has been signed electronically.

## 2018-11-24 ENCOUNTER — Telehealth: Payer: Self-pay

## 2018-11-24 ENCOUNTER — Other Ambulatory Visit: Payer: Self-pay

## 2018-11-24 DIAGNOSIS — F515 Nightmare disorder: Secondary | ICD-10-CM

## 2018-11-24 NOTE — Telephone Encounter (Signed)
-----   Message from Yetta Flock, MD sent at 11/23/2018  4:56 PM EDT ----- Regarding: follow up, consult Hi Sherlynn Stalls can you help with the following: - referral for a sleep study to rule out sleep apnea - can you book her a follow up clinic visit with me, first available   Thanks much!

## 2018-11-24 NOTE — Telephone Encounter (Signed)
Called patient's PCP and requested they order a sleep study (per Dr. Havery Moros). Called patient via interpreter: Betty Cain. Patient scheduled for office visit with Dr. Havery Moros 01/02/19 at 9:20am

## 2018-11-24 NOTE — Telephone Encounter (Signed)
Okay to add the order for split night study and I will cosign it.

## 2018-11-24 NOTE — Telephone Encounter (Signed)
I received a call from [place] with Dr. Havery Moros (GI doc) He is suggesting patient needs a sleep study done and is asking if we can order this since we are primary. Please advise.

## 2018-11-27 ENCOUNTER — Telehealth: Payer: Self-pay

## 2018-11-27 NOTE — Telephone Encounter (Signed)
  Follow up Call-  Call back number 11/23/2018 01/04/2018  Post procedure Call Back phone  # [phone]   Permission to leave phone message Yes Yes  Some recent data might be hidden     Patient questions:  Do you have a fever, pain , or abdominal swelling? No. Pain Score  0 *  Have you tolerated food without any problems? Yes.    Have you been able to return to your normal activities? Yes.    Do you have any questions about your discharge instructions: Diet   No. Medications  No. Follow up visit  No.  Do you have questions or concerns about your Care? No.  Actions: * If pain score is 4 or above: 1. No action needed, pain <4.Have you developed a fever since your procedure? no  2.   Have you had an respiratory symptoms (SOB or cough) since your procedure? no  3.   Have you tested positive for COVID 19 since your procedure no  4.   Have you had any family members/close contacts diagnosed with the COVID 19 since your procedure?  no   If yes to any of these questions please route to Joylene John, RN and Alphonsa Gin, Therapist, sports.

## 2018-11-29 DIAGNOSIS — R0683 Snoring: Secondary | ICD-10-CM

## 2018-12-05 ENCOUNTER — Other Ambulatory Visit: Payer: Self-pay

## 2018-12-05 ENCOUNTER — Inpatient Hospital Stay (HOSPITAL_COMMUNITY): Admission: RE | Admit: 2018-12-05 | Payer: Medicaid Other | Source: Ambulatory Visit

## 2018-12-05 DIAGNOSIS — Z20822 Contact with and (suspected) exposure to covid-19: Secondary | ICD-10-CM

## 2018-12-07 ENCOUNTER — Telehealth: Payer: Self-pay

## 2018-12-07 LAB — NOVEL CORONAVIRUS, NAA: SARS-CoV-2, NAA: NOT DETECTED

## 2018-12-07 MED ORDER — LORAZEPAM 0.5 MG PO TABS: 0.5000 mg | ORAL_TABLET | Freq: Once | ORAL | 0 refills | Status: DC | PRN

## 2018-12-07 NOTE — Telephone Encounter (Signed)
0.5 mg Ativan sent to [facility].

## 2018-12-07 NOTE — Telephone Encounter (Signed)
Patient husband called and is asking if patient can have something to help her sleep. She has a sleep study tomorrow and did not sleep the last time it was scheduled. If this can be done [facility] to [facility] or [facility] on [address]. Thanks!

## 2018-12-08 ENCOUNTER — Ambulatory Visit (HOSPITAL_BASED_OUTPATIENT_CLINIC_OR_DEPARTMENT_OTHER): Payer: Self-pay | Attending: Family Medicine | Admitting: Internal Medicine

## 2018-12-08 ENCOUNTER — Other Ambulatory Visit: Payer: Self-pay

## 2018-12-08 VITALS — Ht <= 58 in | Wt 168.0 lb

## 2018-12-08 DIAGNOSIS — R0683 Snoring: Secondary | ICD-10-CM

## 2018-12-08 DIAGNOSIS — F515 Nightmare disorder: Secondary | ICD-10-CM | POA: Insufficient documentation

## 2018-12-08 NOTE — Telephone Encounter (Signed)
Called and spoke with patient. Advised that ativan has been sent into [facility]. Thanks!

## 2018-12-11 ENCOUNTER — Encounter (HOSPITAL_BASED_OUTPATIENT_CLINIC_OR_DEPARTMENT_OTHER): Payer: Medicaid Other | Admitting: Internal Medicine

## 2018-12-11 ENCOUNTER — Other Ambulatory Visit: Payer: Self-pay

## 2018-12-13 NOTE — Progress Notes (Signed)
PATIENT: Betty Cain DOB: Mar 26, 1976  REASON FOR VISIT: follow up HISTORY FROM: patient  HISTORY OF PRESENT ILLNESS: Today 12/14/18  Betty Cain is a 43 year old Arabic female with history of daily headache.  She has had MRI of the brain that was normal.  She has had elevation of sedimentation rate with unclear clinical significance.  She has had 3 Aimovig injections up to this point.  In the past she was having daily headache, now reports she is having 10 headache days a month.  She says she has had more than 50% improvement with Aimovig.  She remains on Effexor, gabapentin, and Toprol all which may work for migraine prevention.  She was prescribed Maxalt, but not take medication.  She will take Tylenol for headache, but is does not completely relieve the headache.  She reports with her headaches, they occur in the morning when she wakes.  She describes a dull pain, located occipitally, occasionally frontal.  She reports nausea, occasional vomiting.  She recently had a sleep evaluation last Friday, is waiting for the results.  She presents today for follow-up accompanied by an interpreter.  HISTORY 08/28/2018 Dr. Jannifer Franklin: Betty Cain is a 43 year old right-handed Arabic female with a history of almost daily headaches.  The patient may have headaches that come on and last up to 10 days, she may have a few days without headache during the month.  Some of the headaches are disabling to her.  The headaches are usually in the front of the head but may also be in the back of the head, she may have some associated neck pain and crepitus in the neck, if she moves her head a certain way the neck pain dissipates.  She will have flashes of light with the headaches and some visual blurring.  She continues to have some low back pain and some tingling down in the legs.  She has ongoing abdominal pain and is followed by gastroenterologist for this.  She has problems with increased heart rate and is on metoprolol.   She does report photophobia with the headache without phonophobia.  She takes Tylenol and ibuprofen without much benefit.  She has had MRI of the brain that was normal, x-ray of the lumbar spine was unremarkable.  She continues to have elevation of sedimentation rate which is of unclear clinical significance.  She had been on nortriptyline, she is off the medication.  She was sent in a prescription for Effexor but apparently the pharmacy never got the prescription.  We are trying to get her on Aimovig, but she requires patient assistance and this has not yet been approved.  The patient is on very low-dose gabapentin taking only 300 mg in the evening.  The evaluation was done with the help of an interpreter.  REVIEW OF SYSTEMS: Out of a complete 14 system review of symptoms, the patient complains only of the following symptoms, and all other reviewed systems are negative.  Headache  ALLERGIES: Allergies  Allergen Reactions   Topamax [Topiramate] Itching and Rash    HOME MEDICATIONS: Outpatient Medications Prior to Visit  Medication Sig Dispense Refill   acetaminophen (TYLENOL) 500 MG tablet Take 1 tablet (500 mg total) by mouth every 6 (six) hours as needed. 30 tablet 0   albuterol (PROVENTIL HFA;VENTOLIN HFA) 108 (90 Base) MCG/ACT inhaler Inhale 2 puffs into the lungs every 6 (six) hours as needed for wheezing or shortness of breath. 1 Inhaler 2   Blood Pressure Monitoring DEVI 1 each by  Does not apply route daily. 1 Device 0   dicyclomine (BENTYL) 10 MG capsule Take 1 capsule (10 mg total) by mouth every 8 (eight) hours as needed for spasms. 90 capsule 3   Erenumab-aooe (AIMOVIG) 140 MG/ML SOAJ Inject 140 mg into the skin every 30 (thirty) days. 1 pen 4   ergocalciferol (VITAMIN D2) 1.25 MG (50000 UT) capsule Take 50,000 Units by mouth once a week.     etonogestrel (NEXPLANON) 68 MG IMPL implant 1 each by Subdermal route once.     ferrous sulfate (FEROSUL) 325 (65 FE) MG tablet TAKE 1  TABLET (325 MG TOTAL) BY MOUTH DAILY. 30 tablet 3   fluticasone (FLONASE) 50 MCG/ACT nasal spray Place 2 sprays into both nostrils daily. 16 g 6   fluticasone furoate-vilanterol (BREO ELLIPTA) 100-25 MCG/INH AEPB Inhale 1 puff into the lungs daily. 84 each 3   gabapentin (NEURONTIN) 300 MG capsule One capsule in the morning and one in the evening for 1 week, then take one capsule in the morning and 2 in the evening 90 capsule 3   hydrOXYzine (ATARAX/VISTARIL) 10 MG tablet Take 1 tablet (10 mg total) by mouth 3 (three) times daily as needed. 30 tablet 2   ipratropium (ATROVENT) 0.06 % nasal spray Place 2 sprays into both nostrils as needed for rhinitis.     levocetirizine (XYZAL) 5 MG tablet Take 1 tablet (5 mg total) by mouth every evening. 30 tablet 2   LORazepam (ATIVAN) 0.5 MG tablet Take 1 tablet (0.5 mg total) by mouth once as needed for up to 1 dose for sleep. For sleep study 1 tablet 0   methylcellulose (CITRUCEL) oral powder Take as directed, daily     metoprolol succinate (TOPROL XL) 100 MG 24 hr tablet Take 1 tablet (100 mg total) by mouth daily. Take with or immediately following a meal. 90 tablet 1   Multiple Vitamin (MULTI VITAMIN DAILY PO) Take by mouth.     ondansetron (ZOFRAN ODT) 4 MG disintegrating tablet Take 1 tablet (4 mg total) by mouth every 8 (eight) hours as needed for nausea or vomiting. 60 tablet 3   pantoprazole (PROTONIX) 40 MG tablet TAKE 1 TABLET BY MOUTH 2 TIMES DAILY BEFORE A MEAL. 180 tablet 1   potassium chloride 20 MEQ/15ML (10%) SOLN Take 15 mLs (20 mEq total) by mouth daily. 473 mL 2   potassium chloride SA (K-DUR,KLOR-CON) 20 MEQ tablet Take 1 tablet (20 mEq total) by mouth daily. 30 tablet 1   rizatriptan (MAXALT) 10 MG tablet Take 1 tablet (10 mg total) by mouth 3 (three) times daily as needed for migraine. May repeat in 2 hours if needed 10 tablet 3   sucralfate (CARAFATE) 1 GM/10ML suspension Take 10 mLs (1 g total) by mouth every 6 (six)  hours as needed. 420 mL 2   venlafaxine XR (EFFEXOR XR) 37.5 MG 24 hr capsule 1 tablet daily for 1 week, then take 2 tablets daily 60 capsule 1   No facility-administered medications prior to visit.     PAST MEDICAL HISTORY: Past Medical History:  Diagnosis Date   Allergy    Anemia    Asthma    Common migraine with intractable migraine 06/28/2017   GERD (gastroesophageal reflux disease)    pos H pylori   Hypertension    controlled with medication   IBS (irritable bowel syndrome) 07-28-2003   Neonatal death    Vaginal delivery, full term-lived x2 hours.    Palpitations    Shortness  of breath    Spinal headache    Valvular heart disease     PAST SURGICAL HISTORY: Past Surgical History:  Procedure Laterality Date   ADENOIDECTOMY     and tonsils (as a child)   boil  2003   right elbow   CESAREAN SECTION     x4   CESAREAN SECTION  06/02/2011   Procedure: CESAREAN SECTION;  Surgeon: [name], MD;  Location: [facility];  Service: Gynecology;  Laterality: N/A;  Primary Cesarean Section Delivery Baby Boy @ 0004, Apgars 9/9   CESAREAN SECTION N/A 12/10/2013   Procedure: REPEAT CESAREAN SECTION;  Surgeon: [name], MD;  Location: [facility];  Service: Obstetrics;  Laterality: N/A;   colonoscopy  11/23/2018   stated had issues with sleep when in for procedure   OTHER SURGICAL HISTORY     Uterine surgery    VAGINA SURGERY  2005   uterine surgery    FAMILY HISTORY: Family History  Problem Relation Age of Onset   Diabetes Mother    Diabetes Father    Diabetes Sister    Anesthesia problems Neg Hx    Colon cancer Neg Hx    Esophageal cancer Neg Hx    Rectal cancer Neg Hx    Stomach cancer Neg Hx    Colon polyps Neg Hx     SOCIAL HISTORY: Social History   Socioeconomic History   Marital status: Married    Spouse name: Armed forces technical officer   Number of children: 4   Years of education: Not on file   Highest education level: Not on file    Occupational History   Occupation: Unemployed  Scientist, product/process development strain: Not on file   Food insecurity    Worry: Not on file    Inability: Not on file   Transportation needs    Medical: Not on file    Non-medical: Not on file  Tobacco Use   Smoking status: Never Smoker   Smokeless tobacco: Never Used  Substance and Sexual Activity   Alcohol use: No   Drug use: No   Sexual activity: Yes    Birth control/protection: None    Comment: pregnant  Lifestyle   Physical activity    Days per week: Not on file    Minutes per session: Not on file   Stress: Not on file  Relationships   Social connections    Talks on phone: Not on file    Gets together: Not on file    Attends religious service: Not on file    Active member of club or organization: Not on file    Attends meetings of clubs or organizations: Not on file    Relationship status: Not on file   Intimate partner violence    Fear of current or ex partner: Not on file    Emotionally abused: Not on file    Physically abused: Not on file    Forced sexual activity: Not on file  Other Topics Concern   Not on file  Social History Narrative   Lives with husband and child   Caffeine use: daily (tea), sometimes coffee/soda   Right handed     PHYSICAL EXAM  Vitals:   12/14/18 0905  BP: 111/78  Pulse: 98  Temp: 98.6 F (37 C)  Weight: 168 lb 8 oz (76.4 kg)  Height: 4\' 10"  (1.473 m)   Body mass index is 35.22 kg/m.  Generalized: Well developed, in no acute distress  Neurological examination  Mentation: Alert oriented to time, place, history taking. Follows all commands speech and language fluent with use of Arabic interpreter Cranial nerve II-XII: Pupils were equal round reactive to light. Extraocular movements were full, visual field were full on confrontational test. Facial sensation and strength were normal. . Head turning and shoulder shrug  were normal and symmetric. Motor: The motor  testing reveals 5 over 5 strength of all 4 extremities. Good symmetric motor tone is noted throughout.  Sensory: Sensory testing is intact to soft touch on all 4 extremities. No evidence of extinction is noted.  Coordination: Cerebellar testing reveals good finger-nose-finger and heel-to-shin bilaterally.  Gait and station: Gait is normal. Tandem gait is normal. Romberg is negative. No drift is seen.  Reflexes: Deep tendon reflexes are symmetric and normal bilaterally.   DIAGNOSTIC DATA (LABS, IMAGING, TESTING) - I reviewed patient records, labs, notes, testing and imaging myself where available.  Lab Results  Component Value Date   WBC 5.6 08/30/2018   HGB 12.0 08/30/2018   HCT 37.9 08/30/2018   MCV 84.8 08/30/2018   PLT 331 08/30/2018      Component Value Date/Time   NA 137 11/13/2018 0946   K 4.5 11/13/2018 0946   CL 100 11/13/2018 0946   CO2 21 11/13/2018 0946   GLUCOSE 235 (H) 11/13/2018 0946   GLUCOSE 185 (H) 08/30/2018 1240   GLUCOSE 77 09/21/2013 1230   BUN 8 11/13/2018 0946   CREATININE 0.65 11/13/2018 0946   CREATININE 0.58 09/14/2016 0939   CALCIUM 9.7 11/13/2018 0946   PROT 7.5 11/13/2018 0946   ALBUMIN 4.5 11/13/2018 0946   AST 21 11/13/2018 0946   ALT 15 11/13/2018 0946   ALKPHOS 92 11/13/2018 0946   BILITOT <0.2 11/13/2018 0946   GFRNONAA 111 11/13/2018 0946   GFRNONAA >89 09/14/2016 0939   GFRAA 128 11/13/2018 0946   GFRAA >89 09/14/2016 0939   Lab Results  Component Value Date   CHOL 165 10/08/2014   HDL 30 (L) 10/08/2014   LDLCALC 114 (H) 10/08/2014   TRIG 107 10/08/2014   CHOLHDL 5.5 10/08/2014   Lab Results  Component Value Date   HGBA1C 5.7 12/01/2016   Lab Results  Component Value Date   VITAMINB12 332 01/20/2018   Lab Results  Component Value Date   TSH 2.230 11/13/2018    ASSESSMENT AND PLAN 43 y.o. year old female  has a past medical history of Allergy, Anemia, Asthma, Common migraine with intractable migraine (06/28/2017), GERD  (gastroesophageal reflux disease), Hypertension, IBS (irritable bowel syndrome) 2003-07-08), Neonatal death, Palpitations, Shortness of breath, Spinal headache, and Valvular heart disease. here with:  1.  Migraine headache  She reports more than 50% improvement with Aimovig.  At this point, she has had 3 injections.  She reports she was having daily headache, now may have 10 headaches per month.  She will remain on Aimovig, Effexor, and gabapentin.  I have encouraged her to use Maxalt at onset of headache for acute treatment. She was unable to tolerate gabapentin during the day due to drowsiness.  She will continue taking 600 mg at bedtime.  She is also taking Toprol for elevated heart rate.  She had a sleep study done a few days ago, is waiting for results.  If this is abnormal, this may explain her report of headache in the morning when she wakes.  She will follow-up in 3 to 4 months for evaluation of her continued report of headache.  I did advise  her symptoms worsen or she develops any new symptoms she should let us know.  I spent 15 minutes with the patient. 50% of this time was spent discussing her plan of care.   Butler Denmark, AGNP-C, DNP 12/14/2018, 9:24 AM [facility] [address] [place], [place] [address] [phone]

## 2018-12-14 ENCOUNTER — Ambulatory Visit (INDEPENDENT_AMBULATORY_CARE_PROVIDER_SITE_OTHER): Payer: Self-pay | Admitting: Neurology

## 2018-12-14 ENCOUNTER — Encounter: Payer: Self-pay | Admitting: Neurology

## 2018-12-14 ENCOUNTER — Other Ambulatory Visit: Payer: Self-pay

## 2018-12-14 VITALS — BP 111/78 | HR 98 | Temp 98.6°F | Ht <= 58 in | Wt 168.5 lb

## 2018-12-14 DIAGNOSIS — G43019 Migraine without aura, intractable, without status migrainosus: Secondary | ICD-10-CM

## 2018-12-14 DIAGNOSIS — F419 Anxiety disorder, unspecified: Secondary | ICD-10-CM

## 2018-12-14 MED ORDER — VENLAFAXINE HCL ER 75 MG PO CP24: ORAL_CAPSULE | ORAL | 5 refills | Status: DC

## 2018-12-14 MED ORDER — RIZATRIPTAN BENZOATE 10 MG PO TABS: 10.0000 mg | ORAL_TABLET | Freq: Three times a day (TID) | ORAL | 3 refills | Status: DC | PRN

## 2018-12-14 MED ORDER — GABAPENTIN 300 MG PO CAPS: ORAL_CAPSULE | ORAL | 5 refills | Status: DC

## 2018-12-14 MED ORDER — AIMOVIG 140 MG/ML ~~LOC~~ SOAJ: 140.0000 mg | SUBCUTANEOUS | 6 refills | Status: DC

## 2018-12-14 MED FILL — GABAPENTIN 300 MG CAPSULE: 300 | 30 days supply | Qty: 60 | Fill #0

## 2018-12-14 MED FILL — VENLAFAXINE HCL ER 75 MG CA: 75 | 30 days supply | Qty: 30 | Fill #0

## 2018-12-14 MED FILL — ?RIZATRIPTAN 10 MG TABLET: 10 | 22 days supply | Qty: 10 | Fill #0

## 2018-12-14 NOTE — Patient Instructions (Signed)
Please continue current medications. You may take Maxalt for acute headache. Continue Effexor, Aimvoig, gabapentin.

## 2018-12-14 NOTE — Progress Notes (Signed)
I have read the note, and I agree with the clinical assessment and plan.  Angi Goodell K Alie Moudy   

## 2018-12-16 DIAGNOSIS — F515 Nightmare disorder: Secondary | ICD-10-CM

## 2018-12-16 NOTE — Procedures (Signed)
    Patient Name: Betty Cain, Schnipke Date: 12/08/2018 Gender: Female D.O.B: 02/01/1976 Age (years): 22 Referring Provider: Lanae Boast FNP Height (inches): 6 Interpreting Physician: Baird Lyons MD, ABSM Weight (lbs): 168 RPSGT: Earney [place] BMI: 35 MRN: [id number] Neck Size: 14.00  CLINICAL INFORMATION Sleep Study Type: NPSG Indication for sleep study: Snoring Epworth Sleepiness Score: 11  Most recent polysomnogram dated 11/14/2016 revealed an AHI of 0.0/h and RDI of 0.2/h. SLEEP STUDY TECHNIQUE As per the AASM Manual for the Scoring of Sleep and Associated Events v2.3 (April 2016) with a hypopnea requiring 4% desaturations.  The channels recorded and monitored were frontal, central and occipital EEG, electrooculogram (EOG), submentalis EMG (chin), nasal and oral airflow, thoracic and abdominal wall motion, anterior tibialis EMG, snore microphone, electrocardiogram, and pulse oximetry.  MEDICATIONS Medications self-administered by patient taken the night of the study : CETIRIZINE HCL, DICYLOMINE, FERROUS SULFATE, GABAPENTIN, METOPROLOL, MONTELUKAST, NAPROXEN, PANTOPRAZOLE SODIUM, TOPROL XL, ZOFRAN  SLEEP ARCHITECTURE The study was initiated at 10:02:07 PM and ended at 4:29:41 AM.  Sleep onset time was 63.3 minutes and the sleep efficiency was 79.3%%. The total sleep time was 307.2 minutes.  Stage REM latency was 105.5 minutes.  The patient spent 2.3%% of the night in stage N1 sleep, 88.4%% in stage N2 sleep, 0.0%% in stage N3 and 9.3% in REM.  Alpha intrusion was absent.  Supine sleep was 8.21%.  RESPIRATORY PARAMETERS The overall apnea/hypopnea index (AHI) was 1.6 per hour. There were 0 total apneas, including 0 obstructive, 0 central and 0 mixed apneas. There were 8 hypopneas and 0 RERAs.  The AHI during Stage REM sleep was 12.6 per hour.  AHI while supine was 0.0 per hour.  The mean oxygen saturation was 95.6%. The minimum SpO2 during sleep was 86.0%.   loud snoring was noted during this study.  CARDIAC DATA The 2 lead EKG demonstrated sinus rhythm. The mean heart rate was 76.8 beats per minute. Other EKG findings include: None.  LEG MOVEMENT DATA The total PLMS were 0 with a resulting PLMS index of 0.0. Associated arousal with leg movement index was 0.0 .  IMPRESSIONS - No significant obstructive sleep apnea occurred during this study (AHI = 1.6/h). - No significant central sleep apnea occurred during this study (CAI = 0.0/h). - Mild oxygen desaturation was noted during this study (Min O2 = 86.0%). Mean sat 95.6%. - The patient snored with loud snoring volume. - No cardiac abnormalities were noted during this study. - Clinically significant periodic limb movements did not occur during sleep. No significant associated arousals.  DIAGNOSIS - Primary snoring  RECOMMENDATIONS - Manage for symptoms and snoring based on clinical judgment. Consider ENT evaluation if patient is an obligate mouth-breather during sleep. - Sleep hygiene should be reviewed to assess factors that may improve sleep quality. - Weight management and regular exercise should be initiated or continued if appropriate.  [Electronically signed] 12/16/2018 12:16 PM  Baird Lyons MD, [place], American Board of Sleep Medicine   NPI: [id number]                         [place], [facility]  ELECTRONICALLY SIGNED ON:  12/16/2018, 12:13 PM [place] PH: [phone]   FX: [phone] [place]

## 2018-12-20 ENCOUNTER — Encounter (HOSPITAL_COMMUNITY): Payer: Self-pay

## 2018-12-20 ENCOUNTER — Encounter (HOSPITAL_COMMUNITY): Payer: Self-pay | Admitting: *Deleted

## 2018-12-20 MED FILL — LEVOCETIRIZINE 5 MG TABLET: 5 | 30 days supply | Qty: 30 | Fill #2

## 2018-12-21 NOTE — Progress Notes (Signed)
Please let patient know that she does not have sleep apnea. She does have snoring. If she is a mouth breather while sleeping, can be referred to ENT for further evaluation. Otherwise normal sleep study.

## 2018-12-22 ENCOUNTER — Telehealth: Payer: Self-pay

## 2018-12-22 DIAGNOSIS — R0683 Snoring: Secondary | ICD-10-CM

## 2018-12-22 NOTE — Telephone Encounter (Signed)
-----   Message from Lanae Boast, [place] sent at 12/21/2018  4:54 PM EDT ----- Please let patient know that she does not have sleep apnea. She does have snoring. If she is a mouth breather while sleeping, can be referred to ENT for further evaluation. Otherwise normal sleep study.

## 2018-12-22 NOTE — Telephone Encounter (Signed)
Called patient and let her know she does not have sleep apnea. Advised that she does have snoring and she does want to be sent to ENT for this. Betty Cain, Can you please order referral? Thanks!

## 2019-01-02 ENCOUNTER — Other Ambulatory Visit: Payer: Self-pay

## 2019-01-02 ENCOUNTER — Telehealth: Payer: Self-pay | Admitting: Gastroenterology

## 2019-01-02 ENCOUNTER — Ambulatory Visit (INDEPENDENT_AMBULATORY_CARE_PROVIDER_SITE_OTHER): Payer: Self-pay | Admitting: Gastroenterology

## 2019-01-02 VITALS — BP 106/60 | HR 80 | Temp 98.0°F | Ht <= 58 in | Wt 168.0 lb

## 2019-01-02 DIAGNOSIS — E669 Obesity, unspecified: Secondary | ICD-10-CM

## 2019-01-02 DIAGNOSIS — K5900 Constipation, unspecified: Secondary | ICD-10-CM

## 2019-01-02 DIAGNOSIS — R109 Unspecified abdominal pain: Secondary | ICD-10-CM

## 2019-01-02 DIAGNOSIS — R110 Nausea: Secondary | ICD-10-CM

## 2019-01-02 MED ORDER — POLYETHYLENE GLYCOL 3350 17 G PO PACK: PACK | ORAL | 0 refills | Status: DC

## 2019-01-02 NOTE — Telephone Encounter (Signed)
Spoke with Betty Cain of Neurology about this mutual patient. They are okay with stopping Effexor and transitioning to nortriptyline.   Sherlynn Stalls can you ask her to take the Effexor every other day for about 10 days, and then stop. Then, about a week after she stops, please start Nortriptyline 25mg  q HS.   She speaks arabic, will need translator for your call. Thanks for your help.

## 2019-01-02 NOTE — Patient Instructions (Addendum)
If you are age 41 or older, your body mass index should be between 23-30. Your Body mass index is 35.11 kg/m. If this is out of the aforementioned range listed, please consider follow up with your Primary Care Provider.  If you are age 72 or younger, your body mass index should be between 19-25. Your Body mass index is 35.11 kg/m. If this is out of the aformentioned range listed, please consider follow up with your Primary Care Provider.   To help prevent the possible spread of infection to our patients, communities, and staff; we will be implementing the following measures:  As of now we are not allowing any visitors/family members to accompany you to any upcoming appointments with [facility]. If you have any concerns about this please contact our office to discuss prior to the appointment.    Stop taking a fiber supplement.   Take MiraLax, over-the-counter, once to twice a day as needed. We are giving you samples and a coupon today.  We placed a referral for you as discussed to [facility]. It usually takes about 1-2 weeks to process and schedule this referral. If you have not heard from them regarding this appointment in 2 weeks please contact our office.  Thank you for entrusting me with your care and for choosing [facility], Dr. Morrison Cellar      .

## 2019-01-02 NOTE — Progress Notes (Signed)
HPI :  43 y/o female here for a follow up visit.She speaks Arabic and is accompanied by a Optometrist today.She has been evaluated in the past for GERD, and abdominal pain. Previously had an H. Pylori IgG serology in April 2019  and was treated for that. He had recurrence of symptoms after treatment, was placed on Protonix and scheduled for an endoscopy with me in September. She had an EGD on January 04 2018. She had some mild gastritis noted, unfortunately her procedure was aborted due to significant laryngospasm and oxygen desaturation. Esophagus was not well evaluated but a limited exam did not appear to have any significant abnormalities. Unfortunately given the procedure was aborted, biopsies not taken to confirm H. Pylori eradication. We stopped her Protonix and she submitted a stool study which was negative for H. Pylori.She's also had ongoing intermittent chest pains and has seen cardiology. Had prior EST which was normal.   Her main complaint has been ongoing abdominal pain. She has had an extensive evaluation with labs, US abdomen, CT scan abdomen, and most recently a colonoscopy, none of which showed any pathology.  She has had some elevated inflammatory markers, but negative for any evidence of IBD. She tested negative for celiac disease.  He denies any fevers historically.   She has been tried on a variety of regimens to include Carafate, Protonix, gabapentin, topical capsaicin cream, Bentyl.  None of which have significantly provided any benefit.  She has been using some Zofran as needed for nausea which helps.  She has had some constipation relatively recently.  Using fiber supplements but is not helping too much.  She continues to endorse some mid abdominal discomfort which comes and goes.  She thinks overall it is much better than the last time I saw her.  She is no longer having pain every day, usually occurs 3 to 4 days a week, can last hours at a time.  She has some occasional  relief with a bowel movement, although Bentyl does not seem to provide too much benefit.  She denies any blood in her stools.  Overall she thinks she is better than previous.  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  She otherwise asks about assistance for weight loss and thinks perhaps some of her weight gain is contributing to her symptoms.  Prior workup: CT abdomen pelvis 03/16/18 - normal [place] 01/15/14 - gallbladder normal EGD 01/04/18 - significant desaturation due to laryngospasm and procedure was aborted, mild gastriits, esophagus not well evaluated   Colonoscopy 11/23/18 - The perianal and digital rectal examinations were normal. - The terminal ileum appeared normal. - The left colon was extremely tortuous. - A 3 mm polyp was found in the transverse colon. The polyp was sessile. The polyp was removed with a cold snare. Resection and retrieval were complete. - Internal hemorrhoids were found during retroflexion. - The exam was otherwise without abnormality. No obvious inflammatory changes. Of note, given the breathing pattern observed under anesthesia for this case, high suspicion for sleep Apnea. Path shows serrated polyp - repeat colonoscopy in 5 years   Past Medical History:  Diagnosis Date  . Allergy   . Anemia   . Asthma   . Common migraine with intractable migraine 06/28/2017  . GERD (gastroesophageal reflux disease)  pos H pylori  . Hypertension    controlled with medication  . IBS (irritable bowel syndrome) 07-22-2003  . Neonatal death    Vaginal delivery, full term-lived x2 hours.   . Palpitations   . Shortness of breath   . Spinal headache   . Valvular heart disease      Past Surgical History:  Procedure Laterality Date  . ADENOIDECTOMY     and tonsils (as a child)  . boil   Jul 21, 2001   right elbow  . CESAREAN SECTION     x4  . CESAREAN SECTION  06/02/2011   Procedure: CESAREAN SECTION;  Surgeon: [name], MD;  Location: [facility];  Service: Gynecology;  Laterality: N/A;  Primary Cesarean Section Delivery Baby Boy @ 0004, Apgars 9/9  . CESAREAN SECTION N/A 12/10/2013   Procedure: REPEAT CESAREAN SECTION;  Surgeon: [name], MD;  Location: [facility];  Service: Obstetrics;  Laterality: N/A;  . colonoscopy  11/23/2018   stated had issues with sleep when in for procedure  . OTHER SURGICAL HISTORY     Uterine surgery   . VAGINA SURGERY  2003/07/22   uterine surgery   Family History  Problem Relation Age of Onset  . Diabetes Mother   . Diabetes Father   . Diabetes Sister   . Anesthesia problems Neg Hx   . Colon cancer Neg Hx   . Esophageal cancer Neg Hx   . Rectal cancer Neg Hx   . Stomach cancer Neg Hx   . Colon polyps Neg Hx    Social History   Tobacco Use  . Smoking status: Never Smoker  . Smokeless tobacco: Never Used  Substance Use Topics  . Alcohol use: No  . Drug use: No   Current Outpatient Medications  Medication Sig Dispense Refill  . acetaminophen (TYLENOL) 500 MG tablet Take 1 tablet (500 mg total) by mouth every 6 (six) hours as needed. 30 tablet 0  . albuterol (PROVENTIL HFA;VENTOLIN HFA) 108 (90 Base) MCG/ACT inhaler Inhale 2 puffs into the lungs every 6 (six) hours as needed for wheezing or shortness of breath. 1 Inhaler 2  . Blood Pressure Monitoring DEVI 1 each by Does not apply route daily. 1 Device 0  . dicyclomine (BENTYL) 10 MG capsule Take 1 capsule (10 mg total) by mouth every 8 (eight) hours as needed for spasms. 90 capsule 3  . Erenumab-aooe (AIMOVIG) 140 MG/ML SOAJ Inject 140 mg into the skin every 30 (thirty) days. 1 pen 6  . ergocalciferol (VITAMIN D2) 1.25 MG (50000 UT) capsule Take 50,000 Units by mouth once a week.    . etonogestrel (NEXPLANON) 68 MG IMPL implant 1 each by Subdermal route once.    . ferrous sulfate  (FEROSUL) 325 (65 FE) MG tablet TAKE 1 TABLET (325 MG TOTAL) BY MOUTH DAILY. 30 tablet 3  . fluticasone (FLONASE) 50 MCG/ACT nasal spray Place 2 sprays into both nostrils daily. 16 g 6  . fluticasone furoate-vilanterol (BREO ELLIPTA) 100-25 MCG/INH AEPB Inhale 1 puff into the lungs daily. 84 each 3  . gabapentin (NEURONTIN) 300 MG capsule Take 2 capsules in the evening 60 capsule 5  . hydrOXYzine (ATARAX/VISTARIL) 10 MG tablet Take 1 tablet (10 mg total) by mouth 3 (three) times daily as needed. 30 tablet 2  . ipratropium (ATROVENT) 0.06 % nasal spray Place 2 sprays into both nostrils as needed for rhinitis.    Marland Kitchen levocetirizine (XYZAL) 5 MG tablet Take 1 tablet (5 mg total) by mouth  every evening. 30 tablet 2  . LORazepam (ATIVAN) 0.5 MG tablet Take 1 tablet (0.5 mg total) by mouth once as needed for up to 1 dose for sleep. For sleep study 1 tablet 0  . methylcellulose (CITRUCEL) oral powder Take as directed, daily    . metoprolol succinate (TOPROL XL) 100 MG 24 hr tablet Take 1 tablet (100 mg total) by mouth daily. Take with or immediately following a meal. 90 tablet 1  . Multiple Vitamin (MULTI VITAMIN DAILY PO) Take by mouth.    . ondansetron (ZOFRAN ODT) 4 MG disintegrating tablet Take 1 tablet (4 mg total) by mouth every 8 (eight) hours as needed for nausea or vomiting. 60 tablet 3  . pantoprazole (PROTONIX) 40 MG tablet TAKE 1 TABLET BY MOUTH 2 TIMES DAILY BEFORE A MEAL. 180 tablet 1  . potassium chloride 20 MEQ/15ML (10%) SOLN Take 15 mLs (20 mEq total) by mouth daily. 473 mL 2  . potassium chloride SA (K-DUR,KLOR-CON) 20 MEQ tablet Take 1 tablet (20 mEq total) by mouth daily. 30 tablet 1  . rizatriptan (MAXALT) 10 MG tablet Take 1 tablet (10 mg total) by mouth 3 (three) times daily as needed for migraine. May repeat in 2 hours if needed 10 tablet 3  . sucralfate (CARAFATE) 1 GM/10ML suspension Take 10 mLs (1 g total) by mouth every 6 (six) hours as needed. 420 mL 2  . venlafaxine XR  (EFFEXOR-XR) 75 MG 24 hr capsule Take 1 tablet daily 30 capsule 5   No current facility-administered medications for this visit.    Allergies  Allergen Reactions  . Topamax [Topiramate] Itching and Rash     Review of Systems: All systems reviewed and negative except where noted in HPI.   Lab Results  Component Value Date   WBC 5.6 08/30/2018   HGB 12.0 08/30/2018   HCT 37.9 08/30/2018   MCV 84.8 08/30/2018   PLT 331 08/30/2018    Lab Results  Component Value Date   CREATININE 0.65 11/13/2018   BUN 8 11/13/2018   NA 137 11/13/2018   K 4.5 11/13/2018   CL 100 11/13/2018   CO2 21 11/13/2018    Lab Results  Component Value Date   ALT 15 11/13/2018   AST 21 11/13/2018   ALKPHOS 92 11/13/2018   BILITOT <0.2 11/13/2018     Physical Exam: BP 106/60   Pulse 80   Temp 98 F (36.7 C)   Ht 4\' 10"  (1.473 m)   Wt 168 lb (76.2 kg)   BMI 35.11 kg/m  Constitutional: Pleasant,well-developed, female in no acute distress. HEENT: Normocephalic and atraumatic. Conjunctivae are normal. No scleral icterus. Neck supple.  Cardiovascular: Normal rate, regular rhythm.  Pulmonary/chest: Effort normal and breath sounds normal. No wheezing, rales or rhonchi. Abdominal: Soft, nondistended, nontender. There are no masses palpable. No hepatomegaly. Extremities: no edema Lymphadenopathy: No cervical adenopathy noted. Neurological: Alert and oriented to person place and time. Skin: Skin is warm and dry. No rashes noted. Psychiatric: Normal mood and affect. Behavior is normal.   ASSESSMENT AND PLAN: 43 year old female here for reassessment of the following issues:  Abdominal pain Nausea Constipation Obesity  As above, extensive evaluation with EGD, colonoscopy, ultrasound, CT scan, serological work-up, none of which have been revealing other than an H. pylori infection which was treated and eradicated without any relief in her symptoms.  She does have some nonspecific elevation in  her inflammatory markers.  Familial Mediterranean fever is on the differential although quite rare  and she does not have any fevers or other major criteria to go along with this, I think unlikely.  Has been treated with a variety of regimens, none of which have provided any significant benefit.  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  I am recommending that she stop the fiber supplement and switch to MiraLAX and take once to twice daily to help with the constipation. She can use Zofran every 8 hours as needed for nausea which is helped.  I ultimately think she may benefit most from a TCA, such as Nortriptyline, perhaps that will help her migraines as well.  I am going to reach out to her neurologist to see if we can add that in addition to the Effexor or if we need to switch the regimen completely.  Otherwise I will refer her to a weight loss clinic per her request to see if that will help her in her weight loss and the rest of the symptoms.  She agreed with the plan.  We will get back to her in regards to if we can start nortriptyline in the near future.  Kirby Cellar, MD [facility]

## 2019-01-03 ENCOUNTER — Other Ambulatory Visit: Payer: Self-pay

## 2019-01-03 MED ORDER — NORTRIPTYLINE HCL 25 MG PO CAPS: 25.0000 mg | ORAL_CAPSULE | Freq: Every day | ORAL | 1 refills | Status: DC

## 2019-01-03 MED FILL — NORTRIPTYLINE HCL 25 MG CAP: 25 | 30 days supply | Qty: 30 | Fill #0

## 2019-01-03 NOTE — Telephone Encounter (Signed)
Called patient via interpreter Deatra Canter [phone] and let her know her Neurologist agreed to changing her from Effexor to Nortriptyline-25mg  QHS. Gave her the instructions for transitioning to Nortriptyline and sent order for it to patient's pharmacy

## 2019-01-11 DIAGNOSIS — F419 Anxiety disorder, unspecified: Secondary | ICD-10-CM

## 2019-01-11 HISTORY — DX: Anxiety disorder, unspecified: F41.9

## 2019-01-15 ENCOUNTER — Other Ambulatory Visit: Payer: Self-pay | Admitting: Family Medicine

## 2019-01-15 DIAGNOSIS — Z9109 Other allergy status, other than to drugs and biological substances: Secondary | ICD-10-CM

## 2019-01-15 MED FILL — METOPROLOL SUCCINATE ER 100: 100 | 30 days supply | Qty: 30 | Fill #5

## 2019-01-15 MED FILL — GABAPENTIN 300 MG CAPSULE: 300 | 30 days supply | Qty: 60 | Fill #1

## 2019-01-15 MED FILL — DICYCLOMINE 10 MG CAPSULE: 10 | 30 days supply | Qty: 90 | Fill #2

## 2019-01-15 MED FILL — ?RIZATRIPTAN 10 MG TABLET: 10 | 22 days supply | Qty: 10 | Fill #1

## 2019-01-15 MED FILL — PANTOPRAZOLE SOD DR 40 MG T: 40 | 30 days supply | Qty: 60 | Fill #2

## 2019-01-15 MED FILL — ONDANSETRON ODT 4 MG TABLET: 4 | 20 days supply | Qty: 60 | Fill #2

## 2019-01-15 MED FILL — POTASSIUM CL 10% (20 MEQ/15: 20 MEQ/15ML | 31 days supply | Qty: 473 | Fill #1

## 2019-01-16 MED FILL — LEVOCETIRIZINE 5 MG TABLET: 5 | 30 days supply | Qty: 30 | Fill #0

## 2019-02-05 ENCOUNTER — Telehealth: Payer: Self-pay | Admitting: Neurology

## 2019-02-05 NOTE — Telephone Encounter (Signed)
I called [facility] pharmacy and pt has refills, they will get one ready.  I relayed to let them know that if need refills to call there office.  She verbalized understanding.

## 2019-02-05 NOTE — Telephone Encounter (Signed)
Pt is requesting a refill of Erenumab-aooe (AIMOVIG) 140 MG/ML SOAJ , to be sent to [facility], [address]

## 2019-02-06 ENCOUNTER — Telehealth: Payer: Self-pay

## 2019-02-06 NOTE — Telephone Encounter (Signed)
Received fax from [facility] stating pt has been approved for [employer] until 09/11/2019.

## 2019-02-08 ENCOUNTER — Ambulatory Visit (INDEPENDENT_AMBULATORY_CARE_PROVIDER_SITE_OTHER): Payer: Medicaid Other | Admitting: Otolaryngology

## 2019-02-08 MED ORDER — AIMOVIG 140 MG/ML ~~LOC~~ SOAJ: 140.0000 mg | SUBCUTANEOUS | 11 refills | Status: DC

## 2019-02-08 NOTE — Telephone Encounter (Signed)
The prescription was signed.

## 2019-02-08 NOTE — Addendum Note (Signed)
Addended by: Brandon Melnick on: 02/08/2019 02:40 PM   Modules accepted: Orders

## 2019-02-08 NOTE — Telephone Encounter (Addendum)
Pt on PAP from [place].  I spoke to Casey with [employer] PAP.  She states the last prescription they have for pt was from Dr. Jannifer Franklin 08-03-18 (this was for  #4).  Needs new prescription faxed to them [phone] (looks like Claiborne Billings with [employer] [facility] (tried to assist pt with PAP and [employer] refill sent prescription but [employer] would not take).  Dr. Jannifer Franklin to sing.  [employer] [phone] fax [phone].

## 2019-02-08 NOTE — Progress Notes (Signed)
CARDIOLOGY OFFICE NOTE  Date:  02/13/2019    Elwyn Reach Date of Birth: [birth year] Medical Record [id number]  PCP:  Lanae Boast, FNP  Cardiologist:  Jennings Books   Chief Complaint  Patient presents with  . Follow-up    History of Present Illness: Betty Cain is a 43 y.o. female who presents today for a follow up visit.  Seen for Dr. Tamala Julian. Previously seen by Dr. Gwenlyn Found. Primarily follows with me.   Seen in July of 2018 at request of PCP for palpitations and chest pain. She had had similar complaints in 07/19/2013 and work up then showed structurally normal heart by echo and a normal 30 day monitor. CT angio of the chest in 2016/07/19 was unremarkable - no coronary calcification.  I saw her back in 07/19/2017 - she had lots of somatic complaints. She had seen neurology the day before forchronic migraines, paresthesias of all 4 extremities and face. Noted that there was the possibility of anxiety/depressionas the culprit. She was to have MRI of the brain and had labs. Gabapentin was increased.I thought her cardiac status was stable. She is quite stressed with 4 kids.  Still with lots of chest pain when seen in May of 2019 - GXT was arranged and this was negative. Last visit with me in December - still endorsing lots of symptoms. I tried to get a coronary CT - we were unable to get her HR down - ended up doing Lexiscan which was low risk.   The patient does not have symptoms concerning for COVID-19 infection (fever, chills, cough, or new shortness of breath).   Comes in today. Here with an interpreter. Fast heart beating continues. Glucose elevated back in August. She was not aware of this. 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 She says "no time for herself". Loves pasta and rice with sweet tea. Needs Toprol refilled.   Past Medical History:  Diagnosis Date  . Allergy   . Anemia   . Asthma   . Common migraine with intractable migraine 06/28/2017  . GERD  (gastroesophageal reflux disease)    pos H pylori  . Hypertension    controlled with medication  . IBS (irritable bowel syndrome) Jul 20, 2003  . Neonatal death    Vaginal delivery, full term-lived x2 hours.   . Palpitations   . Shortness of breath   . Spinal headache   . Valvular heart disease     Past Surgical History:  Procedure Laterality Date  . ADENOIDECTOMY     and tonsils (as a child)  . boil  19-Jul-2001   right elbow  . CESAREAN SECTION     x4  . CESAREAN SECTION  06/02/2011   Procedure: CESAREAN SECTION;  Surgeon: [name], MD;  Location: [facility];  Service: Gynecology;  Laterality: N/A;  Primary Cesarean Section Delivery Baby Boy @ 0004, Apgars 9/9  . CESAREAN SECTION N/A 12/10/2013   Procedure: REPEAT CESAREAN SECTION;  Surgeon: [name], MD;  Location: [facility];  Service: Obstetrics;  Laterality: N/A;  . colonoscopy  11/23/2018   stated had issues with sleep when in for procedure  . OTHER SURGICAL HISTORY     Uterine surgery   . VAGINA SURGERY  07/20/03   uterine surgery     Medications: Current Meds  Medication Sig  . acetaminophen (TYLENOL) 500 MG tablet Take 1 tablet (500 mg total) by mouth every 6 (six) hours as needed.  Marland Kitchen  albuterol (PROVENTIL HFA;VENTOLIN HFA) 108 (90 Base) MCG/ACT inhaler Inhale 2 puffs into the lungs every 6 (six) hours as needed for wheezing or shortness of breath.  . Blood Pressure Monitoring DEVI 1 each by Does not apply route daily.  Marland Kitchen dicyclomine (BENTYL) 10 MG capsule Take 1 capsule (10 mg total) by mouth every 8 (eight) hours as needed for spasms.  [name] (AIMOVIG) 140 MG/ML SOAJ Inject 140 mg into the skin every 30 (thirty) days.  . ergocalciferol (VITAMIN D2) 1.25 MG (50000 UT) capsule Take 50,000 Units by mouth once a week.  . etonogestrel (NEXPLANON) 68 MG IMPL implant 1 each by Subdermal route once.  . ferrous sulfate (FEROSUL) 325 (65 FE) MG tablet TAKE 1 TABLET (325 MG TOTAL) BY MOUTH DAILY.  . fluticasone (FLONASE) 50  MCG/ACT nasal spray Place 2 sprays into both nostrils daily.  . fluticasone furoate-vilanterol (BREO ELLIPTA) 100-25 MCG/INH AEPB Inhale 1 puff into the lungs daily.  Marland Kitchen gabapentin (NEURONTIN) 300 MG capsule Take 2 capsules in the evening  . hydrOXYzine (ATARAX/VISTARIL) 10 MG tablet Take 1 tablet (10 mg total) by mouth 3 (three) times daily as needed.  Marland Kitchen ipratropium (ATROVENT) 0.06 % nasal spray Place 2 sprays into both nostrils as needed for rhinitis.  Marland Kitchen levocetirizine (XYZAL) 5 MG tablet TAKE 1 TABLET (5 MG TOTAL) BY MOUTH EVERY EVENING.  . LORazepam (ATIVAN) 0.5 MG tablet Take 1 tablet (0.5 mg total) by mouth once as needed for up to 1 dose for sleep. For sleep study  . methylcellulose (CITRUCEL) oral powder Take as directed, daily  . metoprolol succinate (TOPROL XL) 100 MG 24 hr tablet Take 1 tablet (100 mg total) by mouth daily. Take with or immediately following a meal.  . Multiple Vitamin (MULTI VITAMIN DAILY PO) Take by mouth.  . nortriptyline (PAMELOR) 25 MG capsule Take 1 capsule (25 mg total) by mouth at bedtime.  . nortriptyline (PAMELOR) 25 MG capsule Take 1 capsule (25 mg total) by mouth at bedtime.  . ondansetron (ZOFRAN ODT) 4 MG disintegrating tablet Take 1 tablet (4 mg total) by mouth every 8 (eight) hours as needed for nausea or vomiting.  . pantoprazole (PROTONIX) 40 MG tablet TAKE 1 TABLET BY MOUTH 2 TIMES DAILY BEFORE A MEAL.  Marland Kitchen polyethylene glycol (MIRALAX) 17 g packet Take once to twice daily as needed  . potassium chloride 20 MEQ/15ML (10%) SOLN Take 15 mLs (20 mEq total) by mouth daily.  . potassium chloride SA (K-DUR,KLOR-CON) 20 MEQ tablet Take 1 tablet (20 mEq total) by mouth daily.  . rizatriptan (MAXALT) 10 MG tablet Take 1 tablet (10 mg total) by mouth 3 (three) times daily as needed for migraine. May repeat in 2 hours if needed  . sucralfate (CARAFATE) 1 GM/10ML suspension Take 10 mLs (1 g total) by mouth every 6 (six) hours as needed.     Allergies: Allergies   Allergen Reactions  . Topamax [Topiramate] Itching and Rash    Social History: The patient  reports that she has never smoked. She has never used smokeless tobacco. She reports that she does not drink alcohol or use drugs.   Family History: The patient's family history includes Diabetes in her father, mother, and sister.   Review of Systems: Please see the history of present illness.   All other systems are reviewed and negative.   Physical Exam: VS:  BP 118/80   Pulse (!) 121   Ht 4\' 10"  (1.473 m)   Wt 167 lb 12.8  oz (76.1 kg)   SpO2 99%   BMI 35.07 kg/m  .  BMI Body mass index is 35.07 kg/m.  Wt Readings from Last 3 Encounters:  02/13/19 167 lb 12.8 oz (76.1 kg)  01/02/19 168 lb (76.2 kg)  12/14/18 168 lb 8 oz (76.4 kg)    General: Pleasant. Alert and in no acute distress. She is obese.    HEENT: Normal.  Neck: Supple, no JVD, carotid bruits, or masses noted.  Cardiac: Regular rate and rhythm but a little fast. No murmurs, rubs, or gallops. No edema.  Respiratory:  Lungs are clear to auscultation bilaterally with normal work of breathing.  GI: Soft and nontender.  MS: No deformity or atrophy. Gait and ROM intact.  Skin: Warm and dry. Color is normal.  Neuro:  Strength and sensation are intact and no gross focal deficits noted.  Psych: Alert, appropriate and with normal affect.   LABORATORY DATA:  EKG:  EKG is ordered today. This demonstrates sinus tachycardia.  Lab Results  Component Value Date   WBC 5.6 08/30/2018   HGB 12.0 08/30/2018   HCT 37.9 08/30/2018   PLT 331 08/30/2018   GLUCOSE 235 (H) 11/13/2018   CHOL 165 10/08/2014   TRIG 107 10/08/2014   HDL 30 (L) 10/08/2014   LDLCALC 114 (H) 10/08/2014   ALT 15 11/13/2018   AST 21 11/13/2018   NA 137 11/13/2018   K 4.5 11/13/2018   CL 100 11/13/2018   CREATININE 0.65 11/13/2018   BUN 8 11/13/2018   CO2 21 11/13/2018   TSH 2.230 11/13/2018   HGBA1C 5.7 12/01/2016     BNP (last 3 results) No  results for input(s): BNP in the last 8760 hours.  ProBNP (last 3 results) No results for input(s): PROBNP in the last 8760 hours.   Other Studies Reviewed Today:  Myoview Study Highlights 04/2018   Nuclear stress EF: 66%.  There was no ST segment deviation noted during stress.  The study is normal.  This is a low risk study.  The left ventricular ejection fraction is hyperdynamic (>65%).      GXT 09/2017 Study Highlights    Blood pressure demonstrated a normal response to exercise.  There was no ST segment deviation noted during stress.  Clinically and electrically negative for ischemia  Negative GXT     CT Angio 08/10/2016: IMPRESSION: Negative for acute pulmonary embolus or aortic dissection. Clear lung fields.  ECHOCARDIOGRAPHY 2015: Study Conclusions  - Left ventricle: The cavity size was normal. Wall thickness was normal. Systolic function was normal. The estimated ejection fraction was in the range of 60% to 65%.  [place] June 2015:  NSR and ST   Assessment & Plan:  1.Chronic sinus tachycardia - on beta blocker - HR less on EKG - would continue -still suspect this is multifactorial. It has been hard for her to make life style changes.   2. Migraines - followed by PCP  3. Elevated glucose - seeing her PCP tomorrow - I asked her to talk about this.   4. Prior negative cardiac work up.   5. Obesity -discussed again. I think walking daily would really help as well as try to change her diet as we have discussed.   6. COVID-19 Education: The signs and symptoms of COVID-19 were discussed with the patient and how to seek care for testing (follow up with PCP or arrange E-visit).  The importance of social distancing, staying at home, hand hygiene and wearing  a mask when out in public were discussed today.  Current medicines are reviewed with the patient today.  The patient does not have concerns regarding medicines other  than what has been noted above.  The following changes have been made:  See above.  Labs/ tests ordered today include:    Orders Placed This Encounter  Procedures  . EKG 12-Lead     Disposition:   FU with [place] in a year.    Patient is agreeable to this plan and will call if any problems develop in the interim.   SignedTruitt Merle, NP  02/13/2019 10:54 AM  [facility] [address] [place], [place]  [address] Phone: [phone] Fax: [phone]

## 2019-02-08 NOTE — Telephone Encounter (Signed)
CHW called and stated that the patients Aimovig need to be refilled through our office and sent to her home.

## 2019-02-11 DIAGNOSIS — E119 Type 2 diabetes mellitus without complications: Secondary | ICD-10-CM

## 2019-02-11 DIAGNOSIS — E559 Vitamin D deficiency, unspecified: Secondary | ICD-10-CM

## 2019-02-11 HISTORY — DX: Vitamin D deficiency, unspecified: E55.9

## 2019-02-11 HISTORY — DX: Type 2 diabetes mellitus without complications: E11.9

## 2019-02-12 ENCOUNTER — Ambulatory Visit (INDEPENDENT_AMBULATORY_CARE_PROVIDER_SITE_OTHER): Payer: Medicaid Other | Admitting: Otolaryngology

## 2019-02-13 ENCOUNTER — Encounter: Payer: Self-pay | Admitting: Nurse Practitioner

## 2019-02-13 ENCOUNTER — Ambulatory Visit (INDEPENDENT_AMBULATORY_CARE_PROVIDER_SITE_OTHER): Payer: Self-pay | Admitting: Nurse Practitioner

## 2019-02-13 ENCOUNTER — Other Ambulatory Visit: Payer: Self-pay

## 2019-02-13 VITALS — BP 118/80 | HR 121 | Ht <= 58 in | Wt 167.8 lb

## 2019-02-13 DIAGNOSIS — Z7189 Other specified counseling: Secondary | ICD-10-CM

## 2019-02-13 DIAGNOSIS — R000 Tachycardia, unspecified: Secondary | ICD-10-CM

## 2019-02-13 DIAGNOSIS — R002 Palpitations: Secondary | ICD-10-CM

## 2019-02-13 MED ORDER — METOPROLOL SUCCINATE ER 100 MG PO TB24: 100.0000 mg | ORAL_TABLET | Freq: Every day | ORAL | 3 refills | Status: DC

## 2019-02-13 MED FILL — METOPROLOL SUCCINATE ER 100: 100 | 30 days supply | Qty: 30 | Fill #0

## 2019-02-13 NOTE — Patient Instructions (Addendum)
After Visit Summary:  We will be checking the following labs today - NONE   Medication Instructions:    Continue with your current medicines.   I have refilled the Toprol today   If you need a refill on your cardiac medications before your next appointment, please call your pharmacy.     Testing/Procedures To Be Arranged:  N/A  Follow-Up:   See [place] in one year  Talk with your primary care provider about the elevated glucose    At [facility], you and your health needs are our priority.  As part of our continuing mission to provide you with exceptional heart care, we have created designated Provider Care Teams.  These Care Teams include your primary Cardiologist (physician) and Advanced Practice Providers (APPs -  Physician Assistants and Nurse Practitioners) who all work together to provide you with the care you need, when you need it.  Special Instructions:  . Stay safe, stay home, wash your hands for at least 20 seconds and wear a mask when out in public.  . It was good to talk with you today.  . Think about what we talked about today.    Call the [place] office at [phone] if you have any questions, problems or concerns.

## 2019-02-14 ENCOUNTER — Ambulatory Visit (INDEPENDENT_AMBULATORY_CARE_PROVIDER_SITE_OTHER): Payer: Self-pay | Admitting: Family Medicine

## 2019-02-14 ENCOUNTER — Encounter: Payer: Self-pay | Admitting: Family Medicine

## 2019-02-14 VITALS — BP 124/71 | HR 107 | Temp 98.1°F | Ht <= 58 in | Wt 166.6 lb

## 2019-02-14 DIAGNOSIS — F419 Anxiety disorder, unspecified: Secondary | ICD-10-CM | POA: Insufficient documentation

## 2019-02-14 DIAGNOSIS — Z0000 Encounter for general adult medical examination without abnormal findings: Secondary | ICD-10-CM

## 2019-02-14 DIAGNOSIS — Z9109 Other allergy status, other than to drugs and biological substances: Secondary | ICD-10-CM

## 2019-02-14 DIAGNOSIS — E1165 Type 2 diabetes mellitus with hyperglycemia: Secondary | ICD-10-CM

## 2019-02-14 DIAGNOSIS — Z09 Encounter for follow-up examination after completed treatment for conditions other than malignant neoplasm: Secondary | ICD-10-CM

## 2019-02-14 DIAGNOSIS — Z789 Other specified health status: Secondary | ICD-10-CM

## 2019-02-14 DIAGNOSIS — R7303 Prediabetes: Secondary | ICD-10-CM

## 2019-02-14 DIAGNOSIS — E119 Type 2 diabetes mellitus without complications: Secondary | ICD-10-CM

## 2019-02-14 DIAGNOSIS — Z1231 Encounter for screening mammogram for malignant neoplasm of breast: Secondary | ICD-10-CM

## 2019-02-14 DIAGNOSIS — R519 Headache, unspecified: Secondary | ICD-10-CM | POA: Insufficient documentation

## 2019-02-14 LAB — POCT URINALYSIS DIPSTICK
Bilirubin, UA: NEGATIVE
Glucose, UA: NEGATIVE
Ketones, UA: NEGATIVE
Leukocytes, UA: NEGATIVE
Nitrite, UA: NEGATIVE
Protein, UA: POSITIVE — AB
Spec Grav, UA: 1.025 (ref 1.010–1.025)
Urobilinogen, UA: 0.2 E.U./dL
pH, UA: 5.5 (ref 5.0–8.0)

## 2019-02-14 LAB — GLUCOSE, POCT (MANUAL RESULT ENTRY): POC Glucose: 191 mg/dl — AB (ref 70–99)

## 2019-02-14 LAB — POCT GLYCOSYLATED HEMOGLOBIN (HGB A1C): Hemoglobin A1C: 8.8 % — AB (ref 4.0–5.6)

## 2019-02-14 MED ORDER — METFORMIN HCL 500 MG PO TABS: 500.0000 mg | ORAL_TABLET | Freq: Two times a day (BID) | ORAL | 3 refills | Status: DC

## 2019-02-14 MED ORDER — GLIPIZIDE 10 MG PO TABS: 10.0000 mg | ORAL_TABLET | Freq: Two times a day (BID) | ORAL | 3 refills | Status: DC

## 2019-02-14 MED FILL — ?GLIPIZIDE 10 MG TABLET: 10 | 30 days supply | Qty: 60 | Fill #0

## 2019-02-14 MED FILL — metFORMIN HCL 500 MG TABS: 500 | 30 days supply | Qty: 60 | Fill #0

## 2019-02-14 NOTE — Progress Notes (Signed)
Patient [facility]   Established Patient Office Visit  Subjective:  Patient ID: Betty Cain, female    DOB: [birth year]  Age: 43 y.o. MRN: [id number]  CC:  Chief Complaint  Patient presents with  . Follow-up    former Betty Maples NP patient  . Abdominal Pain    lower right side     HPI Betty Cain is a 43 year old female who presents for Follow Up today.   Past Medical History:  Diagnosis Date  . Allergy   . Anemia   . Anxiety 01/2019  . Asthma   . Common migraine with intractable migraine 06/28/2017  . Diabetes ([place]) 02/2019  . GERD (gastroesophageal reflux disease)    pos H pylori  . Hypertension    controlled with medication  . IBS (irritable bowel syndrome) 2003-07-17  . Neonatal death    Vaginal delivery, full term-lived x2 hours.   . Palpitations   . Shortness of breath   . Spinal headache   . Valvular heart disease   . Vitamin D deficiency 02/2019   Current Status: This will be her initial office visit with me. She was previously seeing Lanae Boast, NP for her PCP needs. We will use Virtual Radio broadcast assistant for communication today. Since her last office visit, she is doing well with c/o frequent headaches. She admits that she does not include water in her diet. She has c/o occasional heart palpitations also. She states that she is currently following up with Neurologist, last seen 12/2018. She denies visual changes, chest pain, cough, shortness of breath, and falls. She has occasional headaches and dizziness with position changes. Denies severe headaches, confusion, seizures, double vision, and blurred vision, nausea and vomiting. She denies fevers, chills, recent infections, weight loss, and night sweats. No reports of GI problems such as diarrhea, and constipation. She has no reports of blood in stools, dysuria and hematuria. No depression or anxiety reported today. She denies pain today.   Past Surgical History:  Procedure  Laterality Date  . ADENOIDECTOMY     and tonsils (as a child)  . boil  Jul 16, 2001   right elbow  . CESAREAN SECTION     x4  . CESAREAN SECTION  06/02/2011   Procedure: CESAREAN SECTION;  Surgeon: Betty Kind, MD;  Location: [facility];  Service: Gynecology;  Laterality: N/A;  Primary Cesarean Section Delivery Baby Boy @ 0004, Apgars 9/9  . CESAREAN SECTION N/A 12/10/2013   Procedure: REPEAT CESAREAN SECTION;  Surgeon: Betty Bellman, MD;  Location: [facility];  Service: Obstetrics;  Laterality: N/A;  . colonoscopy  11/23/2018   stated had issues with sleep when in for procedure  . OTHER SURGICAL HISTORY     Uterine surgery   . VAGINA SURGERY  07/17/03   uterine surgery    Family History  Problem Relation Age of Onset  . Diabetes Mother   . Diabetes Father   . Diabetes Sister   . Anesthesia problems Neg Hx   . Colon cancer Neg Hx   . Esophageal cancer Neg Hx   . Rectal cancer Neg Hx   . Stomach cancer Neg Hx   . Colon polyps Neg Hx     Social History   Socioeconomic History  . Marital status: Married    Spouse name: Betty Cain  . Number of children: 4  . Years of education: Not on file  . Highest education level: Not on file  Occupational History  .  Occupation: Unemployed  Social Needs  . Financial resource strain: Not on file  . Food insecurity    Worry: Not on file    Inability: Not on file  . Transportation needs    Medical: Not on file    Non-medical: Not on file  Tobacco Use  . Smoking status: Never Smoker  . Smokeless tobacco: Never Used  Substance and Sexual Activity  . Alcohol use: No  . Drug use: No  . Sexual activity: Yes    Birth control/protection: None    Comment: pregnant  Lifestyle  . Physical activity    Days per week: Not on file    Minutes per session: Not on file  . Stress: Not on file  Relationships  . Social Herbalist on phone: Not on file    Gets together: Not on file    Attends religious service: Not on file    Active member of  club or organization: Not on file    Attends meetings of clubs or organizations: Not on file    Relationship status: Not on file  . Intimate partner violence    Fear of current or ex partner: Not on file    Emotionally abused: Not on file    Physically abused: Not on file    Forced sexual activity: Not on file  Other Topics Concern  . Not on file  Social History Narrative   Lives with husband and child   Caffeine use: daily (tea), sometimes coffee/soda   Right handed     Outpatient Medications Prior to Visit  Medication Sig Dispense Refill  . acetaminophen (TYLENOL) 500 MG tablet Take 1 tablet (500 mg total) by mouth every 6 (six) hours as needed. 30 tablet 0  . albuterol (PROVENTIL HFA;VENTOLIN HFA) 108 (90 Base) MCG/ACT inhaler Inhale 2 puffs into the lungs every 6 (six) hours as needed for wheezing or shortness of breath. 1 Inhaler 2  . Blood Pressure Monitoring DEVI 1 each by Does not apply route daily. 1 Device 0  . etonogestrel (NEXPLANON) 68 MG IMPL implant 1 each by Subdermal route once.    . ferrous sulfate (FEROSUL) 325 (65 FE) MG tablet TAKE 1 TABLET (325 MG TOTAL) BY MOUTH DAILY. 30 tablet 3  . fluticasone (FLONASE) 50 MCG/ACT nasal spray Place 2 sprays into both nostrils daily. 16 g 6  . fluticasone furoate-vilanterol (BREO ELLIPTA) 100-25 MCG/INH AEPB Inhale 1 puff into the lungs daily. 84 each 3  . gabapentin (NEURONTIN) 300 MG capsule Take 2 capsules in the evening 60 capsule 5  . hydrOXYzine (ATARAX/VISTARIL) 10 MG tablet Take 1 tablet (10 mg total) by mouth 3 (three) times daily as needed. 30 tablet 2  . ipratropium (ATROVENT) 0.06 % nasal spray Place 2 sprays into both nostrils as needed for rhinitis.    Marland Kitchen levocetirizine (XYZAL) 5 MG tablet TAKE 1 TABLET (5 MG TOTAL) BY MOUTH EVERY EVENING. 30 tablet 2  . methylcellulose (CITRUCEL) oral powder Take as directed, daily    . metoprolol succinate (TOPROL XL) 100 MG 24 hr tablet Take 1 tablet (100 mg total) by mouth daily.  Take with or immediately following a meal. 90 tablet 3  . Multiple Vitamin (MULTI VITAMIN DAILY PO) Take by mouth.    . nortriptyline (PAMELOR) 25 MG capsule Take 1 capsule (25 mg total) by mouth at bedtime. 30 capsule 1  . ondansetron (ZOFRAN ODT) 4 MG disintegrating tablet Take 1 tablet (4 mg total) by mouth every  8 (eight) hours as needed for nausea or vomiting. 60 tablet 3  . pantoprazole (PROTONIX) 40 MG tablet TAKE 1 TABLET BY MOUTH 2 TIMES DAILY BEFORE A MEAL. 180 tablet 1  . polyethylene glycol (MIRALAX) 17 g packet Take once to twice daily as needed 14 each 0  . sucralfate (CARAFATE) 1 GM/10ML suspension Take 10 mLs (1 g total) by mouth every 6 (six) hours as needed. 420 mL 2  . Erenumab-aooe (AIMOVIG) 140 MG/ML SOAJ Inject 140 mg into the skin every 30 (thirty) days. (Patient not taking: Reported on 02/14/2019) 1 pen 11  . potassium chloride 20 MEQ/15ML (10%) SOLN Take 15 mLs (20 mEq total) by mouth daily. (Patient not taking: Reported on 02/14/2019) 473 mL 2  . rizatriptan (MAXALT) 10 MG tablet Take 1 tablet (10 mg total) by mouth 3 (three) times daily as needed for migraine. May repeat in 2 hours if needed (Patient not taking: Reported on 02/14/2019) 10 tablet 3  . dicyclomine (BENTYL) 10 MG capsule Take 1 capsule (10 mg total) by mouth every 8 (eight) hours as needed for spasms. 90 capsule 3  . ergocalciferol (VITAMIN D2) 1.25 MG (50000 UT) capsule Take 50,000 Units by mouth once a week.    Marland Kitchen LORazepam (ATIVAN) 0.5 MG tablet Take 1 tablet (0.5 mg total) by mouth once as needed for up to 1 dose for sleep. For sleep study 1 tablet 0  . nortriptyline (PAMELOR) 25 MG capsule Take 1 capsule (25 mg total) by mouth at bedtime. 30 capsule 1  . potassium chloride SA (K-DUR,KLOR-CON) 20 MEQ tablet Take 1 tablet (20 mEq total) by mouth daily. 30 tablet 1   No facility-administered medications prior to visit.     Allergies  Allergen Reactions  . Topamax [Topiramate] Itching and Rash    ROS  Review of Systems  Constitutional: Positive for fatigue (frequent).  HENT: Negative.   Eyes: Negative.   Respiratory: Negative.   Cardiovascular: Negative.   Gastrointestinal: Negative.   Endocrine: Negative.   Genitourinary: Negative.   Musculoskeletal: Negative.   Skin: Negative.   Allergic/Immunologic: Positive for environmental allergies.  Neurological: Positive for dizziness (occasional ) and headaches (occasional).  Hematological: Negative.   Psychiatric/Behavioral: Negative.    Objective:    Physical Exam  Constitutional: She is oriented to person, place, and time. She appears well-developed and well-nourished.  HENT:  Head: Normocephalic and atraumatic.  Eyes: Conjunctivae are normal.  Neck: Normal range of motion. Neck supple.  Cardiovascular: Normal rate, normal heart sounds and intact distal pulses.  Pulmonary/Chest: Effort normal and breath sounds normal.  Abdominal: Soft. Bowel sounds are normal.  Musculoskeletal: Normal range of motion.  Neurological: She is alert and oriented to person, place, and time. She has normal reflexes.  Skin: Skin is warm and dry.  Psychiatric: She has a normal mood and affect. Her behavior is normal. Judgment and thought content normal.  Nursing note and vitals reviewed.   BP 124/71   Pulse (!) 107   Temp 98.1 F (36.7 C)   Ht 4\' 10"  (1.473 m)   Wt 166 lb 9.6 oz (75.6 kg)   SpO2 100%   BMI 34.82 kg/m  Wt Readings from Last 3 Encounters:  02/14/19 166 lb 9.6 oz (75.6 kg)  02/13/19 167 lb 12.8 oz (76.1 kg)  01/02/19 168 lb (76.2 kg)     Health Maintenance Due  Topic Date Due  . URINE MICROALBUMIN  11/10/1987  . PAP SMEAR-Modifier  06/19/2016    There  are no preventive care reminders to display for this patient.  Lab Results  Component Value Date   TSH 2.230 11/13/2018   Lab Results  Component Value Date   WBC 5.6 08/30/2018   HGB 12.0 08/30/2018   HCT 37.9 08/30/2018   MCV 84.8 08/30/2018   PLT 331 08/30/2018    Lab Results  Component Value Date   NA 137 11/13/2018   K 4.5 11/13/2018   CO2 21 11/13/2018   GLUCOSE 235 (H) 11/13/2018   BUN 8 11/13/2018   CREATININE 0.65 11/13/2018   BILITOT <0.2 11/13/2018   ALKPHOS 92 11/13/2018   AST 21 11/13/2018   ALT 15 11/13/2018   PROT 7.5 11/13/2018   ALBUMIN 4.5 11/13/2018   CALCIUM 9.7 11/13/2018   ANIONGAP 11 08/30/2018   GFR 119.42 08/28/2018   Lab Results  Component Value Date   CHOL 165 10/08/2014   Lab Results  Component Value Date   HDL 30 (L) 10/08/2014   Lab Results  Component Value Date   LDLCALC 114 (H) 10/08/2014   Lab Results  Component Value Date   TRIG 107 10/08/2014   Lab Results  Component Value Date   CHOLHDL 5.5 10/08/2014   Lab Results  Component Value Date   HGBA1C 8.8 (A) 02/14/2019      Assessment & Plan:   1. Type 2 diabetes mellitus with hyperglycemia, without long-term current use of insulin (HCC) We will initiate Metformin and Glucotrol today. She will to take medication as prescribed, to decrease foods/beverages high in sugars and carbs and follow Heart Healthy or DASH diet. Increase physical activity to at least 30 minutes cardio exercise daily.  - metFORMIN (GLUCOPHAGE) 500 MG tablet; Take 1 tablet (500 mg total) by mouth 2 (two) times daily with a meal.  Dispense: 180 tablet; Refill: 3 - glipiZIDE (GLUCOTROL) 10 MG tablet; Take 1 tablet (10 mg total) by mouth 2 (two) times daily before a meal.  Dispense: 60 tablet; Refill: 3  2. Hemoglobin A1C between 7% and 9% indicating borderline diabetic control Hgb A1c has increased to 8.8 today from 5.7. She will begin anti-diabetic medications as prescribed. We will reassess A1c level at next office visit.   3. Language barrier to communication Radio broadcast assistant used today.   4. Environmental allergies  5. Frequent headaches She will increase her water intake to at least 64 ounces of water today.  6. Anxiety Stable today.   7. Breast cancer  screening Patient is undecided. We will revisit for Mammogram screening at next office visit.   8. Healthcare maintenance - POCT urinalysis dipstick - POCT glycosylated hemoglobin (Hb A1C) - POCT glucose (manual entry) - Vitamin B12 - Vitamin D, 25-hydroxy  9. Follow up She will follow up in 3 months.   Meds ordered this encounter  Medications  . metFORMIN (GLUCOPHAGE) 500 MG tablet    Sig: Take 1 tablet (500 mg total) by mouth 2 (two) times daily with a meal.    Dispense:  180 tablet    Refill:  3  . glipiZIDE (GLUCOTROL) 10 MG tablet    Sig: Take 1 tablet (10 mg total) by mouth 2 (two) times daily before a meal.    Dispense:  60 tablet    Refill:  3    Orders Placed This Encounter  Procedures  . Vitamin B12  . Vitamin D, 25-hydroxy  . POCT urinalysis dipstick  . POCT glycosylated hemoglobin (Hb A1C)  . POCT glucose (manual entry)    Referral  Orders  No referral(s) requested today    Kathe Becton,  MSN, FNP-BC [place], [place] [address] [phone] fax   Problem List Items Addressed This Visit      Other   Anxiety   Environmental allergies   Frequent headaches   Language barrier to communication    Other Visit Diagnoses    Type 2 diabetes mellitus with hyperglycemia, without long-term current use of insulin ([place])    -  Primary   Relevant Medications   metFORMIN (GLUCOPHAGE) 500 MG tablet   glipiZIDE (GLUCOTROL) 10 MG tablet   Hemoglobin A1C between 7% and 9% indicating borderline diabetic control       Relevant Medications   metFORMIN (GLUCOPHAGE) 500 MG tablet   glipiZIDE (GLUCOTROL) 10 MG tablet   Encounter for screening for malignant neoplasm of breast, unspecified screening modality       Healthcare maintenance       Relevant Orders   POCT urinalysis dipstick (Completed)   POCT glycosylated hemoglobin (Hb A1C) (Completed)   POCT  glucose (manual entry) (Completed)   Vitamin B12 (Completed)   Vitamin D, 25-hydroxy (Completed)   Follow up          Meds ordered this encounter  Medications  . metFORMIN (GLUCOPHAGE) 500 MG tablet    Sig: Take 1 tablet (500 mg total) by mouth 2 (two) times daily with a meal.    Dispense:  180 tablet    Refill:  3  . glipiZIDE (GLUCOTROL) 10 MG tablet    Sig: Take 1 tablet (10 mg total) by mouth 2 (two) times daily before a meal.    Dispense:  60 tablet    Refill:  3    Follow-up: Return in about 1 month (around 03/16/2019).    Azzie Glatter, FNP

## 2019-02-14 NOTE — Patient Instructions (Signed)
Glipizide tablets What is this medicine? GLIPIZIDE (GLIP i zide) helps to treat type 2 diabetes. Treatment is combined with diet and exercise. The medicine helps your body to use insulin better. This medicine may be used for other purposes; ask your health care provider or pharmacist if you have questions. COMMON BRAND NAME(S): Glucotrol What should I tell my health care provider before I take this medicine? They need to know if you have any of these conditions:  diabetic ketoacidosis  glucose-6-phosphate dehydrogenase deficiency  heart disease  kidney disease  liver disease  porphyria  severe infection or injury  thyroid disease  an unusual or allergic reaction to glipizide, sulfa drugs, other medicines, foods, dyes, or preservatives  pregnant or trying to get pregnant  breast-feeding How should I use this medicine? Take this medicine by mouth. Swallow with a drink of water. Do not take with food. Take it 30 minutes before a meal. Follow the directions on the prescription label. If you take this medicine once a day, take it 30 minutes before breakfast. Take your doses at the same time each day. Do not take more often than directed. Talk to your pediatrician regarding the use of this medicine in children. Special care may be needed. Elderly patients over 78 years old may have a stronger reaction and need a smaller dose. Overdosage: If you think you have taken too much of this medicine contact a poison control center or emergency room at once. NOTE: This medicine is only for you. Do not share this medicine with others. What if I miss a dose? If you miss a dose, take it as soon as you can. If it is almost time for your next dose, take only that dose. Do not take double or extra doses. What may interact with this medicine?  bosentan  chloramphenicol  cisapride  clarithromycin  medicines for fungal or yeast infections  metoclopramide  probenecid  warfarin Many  medications may cause an increase or decrease in blood sugar, these include:  alcohol containing beverages  aspirin and aspirin-like drugs  chloramphenicol  chromium  diuretics  female hormones, like estrogens or progestins and birth control pills  heart medicines  isoniazid  female hormones or anabolic steroids  medicines for weight loss  medicines for allergies, asthma, cold, or cough  medicines for mental problems  medicines called MAO Inhibitors like Nardil, Parnate, Marplan, Eldepryl  niacin  NSAIDs, medicines for pain and inflammation, like ibuprofen or naproxen  pentamidine  phenytoin  probenecid  quinolone antibiotics like ciprofloxacin, levofloxacin, ofloxacin  some herbal dietary supplements  steroid medicines like prednisone or cortisone  thyroid medicine This list may not describe all possible interactions. Give your health care provider a list of all the medicines, herbs, non-prescription drugs, or dietary supplements you use. Also tell them if you smoke, drink alcohol, or use illegal drugs. Some items may interact with your medicine. What should I watch for while using this medicine? Visit your doctor or health care professional for regular checks on your progress. A test called the HbA1C (A1C) will be monitored. This is a simple blood test. It measures your blood sugar control over the last 2 to 3 months. You will receive this test every 3 to 6 months. Learn how to check your blood sugar. Learn the symptoms of low and high blood sugar and how to manage them. Always carry a quick-source of sugar with you in case you have symptoms of low blood sugar. Examples include hard sugar candy or  glucose tablets. Make sure others know that you can choke if you eat or drink when you develop serious symptoms of low blood sugar, such as seizures or unconsciousness. They must get medical help at once. Tell your doctor or health care professional if you have high blood  sugar. You might need to change the dose of your medicine. If you are sick or exercising more than usual, you might need to change the dose of your medicine. Do not skip meals. Ask your doctor or health care professional if you should avoid alcohol. Many nonprescription cough and cold products contain sugar or alcohol. These can affect blood sugar. This medicine can make you more sensitive to the sun. Keep out of the sun. If you cannot avoid being in the sun, wear protective clothing and use sunscreen. Do not use sun lamps or tanning beds/booths. Wear a medical ID bracelet or chain, and carry a card that describes your disease and details of your medicine and dosage times. What side effects may I notice from receiving this medicine? Side effects that you should report to your doctor or health care professional as soon as possible:  allergic reactions like skin rash, itching or hives, swelling of the face, lips, or tongue  breathing problems  dark urine  fever, chills, sore throat  signs and symptoms of low blood sugar such as feeling anxious, confusion, dizziness, increased hunger, unusually weak or tired, sweating, shakiness, cold, irritable, headache, blurred vision, fast heartbeat, loss of consciousness  unusual bleeding or bruising  yellowing of the eyes or skin Side effects that usually do not require medical attention (report to your doctor or health care professional if they continue or are bothersome):  diarrhea  dizziness  headache  heartburn  nausea  stomach gas This list may not describe all possible side effects. Call your doctor for medical advice about side effects. You may report side effects to FDA at 1-800-FDA-1088. Where should I keep my medicine? Keep out of the reach of children. Store at room temperature below 30 degrees C (86 degrees F). Throw away any unused medicine after the expiration date. NOTE: This sheet is a summary. It may not cover all possible  information. If you have questions about this medicine, talk to your doctor, pharmacist, or health care provider.  2020 Elsevier/Gold Standard (2012-07-12 14:42:46) Metformin tablets What is this medicine? METFORMIN (met FOR min) is used to treat type 2 diabetes. It helps to control blood sugar. Treatment is combined with diet and exercise. This medicine can be used alone or with other medicines for diabetes. This medicine may be used for other purposes; ask your health care provider or pharmacist if you have questions. COMMON BRAND NAME(S): Glucophage What should I tell my health care provider before I take this medicine? They need to know if you have any of these conditions:  anemia  dehydration  heart disease  frequently drink alcohol-containing beverages  kidney disease  liver disease  polycystic ovary syndrome  serious infection or injury  vomiting  an unusual or allergic reaction to metformin, other medicines, foods, dyes, or preservatives  pregnant or trying to get pregnant  breast-feeding How should I use this medicine? Take this medicine by mouth with a glass of water. Follow the directions on the prescription label. Take this medicine with food. Take your medicine at regular intervals. Do not take your medicine more often than directed. Do not stop taking except on your doctor's advice. Talk to your pediatrician regarding the use  of this medicine in children. While this drug may be prescribed for children as young as 83 years of age for selected conditions, precautions do apply. Overdosage: If you think you have taken too much of this medicine contact a poison control center or emergency room at once. NOTE: This medicine is only for you. Do not share this medicine with others. What if I miss a dose? If you miss a dose, take it as soon as you can. If it is almost time for your next dose, take only that dose. Do not take double or extra doses. What may interact with  this medicine? Do not take this medicine with any of the following medications:  certain contrast medicines given before X-rays, CT scans, MRI, or other procedures  dofetilide This medicine may also interact with the following medications:  acetazolamide  alcohol  certain antivirals for HIV or hepatitis  certain medicines for blood pressure, heart disease, irregular heart beat  cimetidine  dichlorphenamide  digoxin  diuretics  female hormones, like estrogens or progestins and birth control pills  glycopyrrolate  isoniazid  lamotrigine  memantine  methazolamide  metoclopramide  midodrine  niacin  phenothiazines like chlorpromazine, mesoridazine, prochlorperazine, thioridazine  phenytoin  ranolazine  steroid medicines like prednisone or cortisone  stimulant medicines for attention disorders, weight loss, or to stay awake  thyroid medicines  topiramate  trospium  vandetanib  zonisamide This list may not describe all possible interactions. Give your health care provider a list of all the medicines, herbs, non-prescription drugs, or dietary supplements you use. Also tell them if you smoke, drink alcohol, or use illegal drugs. Some items may interact with your medicine. What should I watch for while using this medicine? Visit your doctor or health care professional for regular checks on your progress. A test called the HbA1C (A1C) will be monitored. This is a simple blood test. It measures your blood sugar control over the last 2 to 3 months. You will receive this test every 3 to 6 months. Learn how to check your blood sugar. Learn the symptoms of low and high blood sugar and how to manage them. Always carry a quick-source of sugar with you in case you have symptoms of low blood sugar. Examples include hard sugar candy or glucose tablets. Make sure others know that you can choke if you eat or drink when you develop serious symptoms of low blood sugar, such  as seizures or unconsciousness. They must get medical help at once. Tell your doctor or health care professional if you have high blood sugar. You might need to change the dose of your medicine. If you are sick or exercising more than usual, you might need to change the dose of your medicine. Do not skip meals. Ask your doctor or health care professional if you should avoid alcohol. Many nonprescription cough and cold products contain sugar or alcohol. These can affect blood sugar. This medicine may cause ovulation in premenopausal women who do not have regular monthly periods. This may increase your chances of becoming pregnant. You should not take this medicine if you become pregnant or think you may be pregnant. Talk with your doctor or health care professional about your birth control options while taking this medicine. Contact your doctor or health care professional right away if you think you are pregnant. If you are going to need surgery, a MRI, CT scan, or other procedure, tell your doctor that you are taking this medicine. You may need to stop taking  this medicine before the procedure. Wear a medical ID bracelet or chain, and carry a card that describes your disease and details of your medicine and dosage times. This medicine may cause a decrease in folic acid and vitamin B12. You should make sure that you get enough vitamins while you are taking this medicine. Discuss the foods you eat and the vitamins you take with your health care professional. What side effects may I notice from receiving this medicine? Side effects that you should report to your doctor or health care professional as soon as possible:  allergic reactions like skin rash, itching or hives, swelling of the face, lips, or tongue  breathing problems  feeling faint or lightheaded, falls  muscle aches or pains  signs and symptoms of low blood sugar such as feeling anxious, confusion, dizziness, increased hunger, unusually  weak or tired, sweating, shakiness, cold, irritable, headache, blurred vision, fast heartbeat, loss of consciousness  slow or irregular heartbeat  unusual stomach pain or discomfort  unusually tired or weak Side effects that usually do not require medical attention (report to your doctor or health care professional if they continue or are bothersome):  diarrhea  headache  heartburn  metallic taste in mouth  nausea  stomach gas, upset This list may not describe all possible side effects. Call your doctor for medical advice about side effects. You may report side effects to FDA at 1-800-FDA-1088. Where should I keep my medicine? Keep out of the reach of children. Store at room temperature between 15 and 30 degrees C (59 and 86 degrees F). Protect from moisture and light. Throw away any unused medicine after the expiration date. NOTE: This sheet is a summary. It may not cover all possible information. If you have questions about this medicine, talk to your doctor, pharmacist, or health care provider.  2020 Elsevier/Gold Standard (2017-05-05 19:15:19) Diabetes Mellitus and Exercise Exercising regularly is important for your overall health, especially when you have diabetes (diabetes mellitus). Exercising is not only about losing weight. It has many other health benefits, such as increasing muscle strength and bone density and reducing body fat and stress. This leads to improved fitness, flexibility, and endurance, all of which result in better overall health. Exercise has additional benefits for people with diabetes, including:  Reducing appetite.  Helping to lower and control blood glucose.  Lowering blood pressure.  Helping to control amounts of fatty substances (lipids) in the blood, such as cholesterol and triglycerides.  Helping the body to respond better to insulin (improving insulin sensitivity).  Reducing how much insulin the body needs.  Decreasing the risk for heart  disease by: ? Lowering cholesterol and triglyceride levels. ? Increasing the levels of good cholesterol. ? Lowering blood glucose levels. What is my activity plan? Your health care provider or certified diabetes educator can help you make a plan for the type and frequency of exercise (activity plan) that works for you. Make sure that you:  Do at least 150 minutes of moderate-intensity or vigorous-intensity exercise each week. This could be brisk walking, biking, or water aerobics. ? Do stretching and strength exercises, such as yoga or weightlifting, at least 2 times a week. ? Spread out your activity over at least 3 days of the week.  Get some form of physical activity every day. ? Do not go more than 2 days in a row without some kind of physical activity. ? Avoid being inactive for more than 30 minutes at a time. Take frequent breaks to  walk or stretch.  Choose a type of exercise or activity that you enjoy, and set realistic goals.  Start slowly, and gradually increase the intensity of your exercise over time. What do I need to know about managing my diabetes?   Check your blood glucose before and after exercising. ? If your blood glucose is 240 mg/dL (13.3 mmol/L) or higher before you exercise, check your urine for ketones. If you have ketones in your urine, do not exercise until your blood glucose returns to normal. ? If your blood glucose is 100 mg/dL (5.6 mmol/L) or lower, eat a snack containing 15-20 grams of carbohydrate. Check your blood glucose 15 minutes after the snack to make sure that your level is above 100 mg/dL (5.6 mmol/L) before you start your exercise.  Know the symptoms of low blood glucose (hypoglycemia) and how to treat it. Your risk for hypoglycemia increases during and after exercise. Common symptoms of hypoglycemia can include: ? Hunger. ? Anxiety. ? Sweating and feeling clammy. ? Confusion. ? Dizziness or feeling light-headed. ? Increased heart rate or  palpitations. ? Blurry vision. ? Tingling or numbness around the mouth, lips, or tongue. ? Tremors or shakes. ? Irritability.  Keep a rapid-acting carbohydrate snack available before, during, and after exercise to help prevent or treat hypoglycemia.  Avoid injecting insulin into areas of the body that are going to be exercised. For example, avoid injecting insulin into: ? The arms, when playing tennis. ? The legs, when jogging.  Keep records of your exercise habits. Doing this can help you and your health care provider adjust your diabetes management plan as needed. Write down: ? Food that you eat before and after you exercise. ? Blood glucose levels before and after you exercise. ? The type and amount of exercise you have done. ? When your insulin is expected to peak, if you use insulin. Avoid exercising at times when your insulin is peaking.  When you start a new exercise or activity, work with your health care provider to make sure the activity is safe for you, and to adjust your insulin, medicines, or food intake as needed.  Drink plenty of water while you exercise to prevent dehydration or heat stroke. Drink enough fluid to keep your urine clear or pale yellow. Summary  Exercising regularly is important for your overall health, especially when you have diabetes (diabetes mellitus).  Exercising has many health benefits, such as increasing muscle strength and bone density and reducing body fat and stress.  Your health care provider or certified diabetes educator can help you make a plan for the type and frequency of exercise (activity plan) that works for you.  When you start a new exercise or activity, work with your health care provider to make sure the activity is safe for you, and to adjust your insulin, medicines, or food intake as needed. This information is not intended to replace advice given to you by your health care provider. Make sure you discuss any questions you have  with your health care provider. Document Released: 06/19/2003 Document Revised: 10/21/2016 Document Reviewed: 09/08/2015 Elsevier Patient Education  2020 [place]. Diabetes Basics  Diabetes (diabetes mellitus) is a long-term (chronic) disease. It occurs when the body does not properly use sugar (glucose) that is released from food after you eat. Diabetes may be caused by one or both of these problems:  Your pancreas does not make enough of a hormone called insulin.  Your body does not react in a normal way  to insulin that it makes. Insulin lets sugars (glucose) go into cells in your body. This gives you energy. If you have diabetes, sugars cannot get into cells. This causes high blood sugar (hyperglycemia). Follow these instructions at home: How is diabetes treated? You may need to take insulin or other diabetes medicines daily to keep your blood sugar in balance. Take your diabetes medicines every day as told by your doctor. List your diabetes medicines here: Diabetes medicines  Name of medicine: ______________________________ ? Amount (dose): _______________ Time (a.m./p.m.): _______________ Notes: ___________________________________  Name of medicine: ______________________________ ? Amount (dose): _______________ Time (a.m./p.m.): _______________ Notes: ___________________________________  Name of medicine: ______________________________ ? Amount (dose): _______________ Time (a.m./p.m.): _______________ Notes: ___________________________________ If you use insulin, you will learn how to give yourself insulin by injection. You may need to adjust the amount based on the food that you eat. List the types of insulin you use here: Insulin  Insulin type: ______________________________ ? Amount (dose): _______________ Time (a.m./p.m.): _______________ Notes: ___________________________________  Insulin type: ______________________________ ? Amount (dose): _______________ Time  (a.m./p.m.): _______________ Notes: ___________________________________  Insulin type: ______________________________ ? Amount (dose): _______________ Time (a.m./p.m.): _______________ Notes: ___________________________________  Insulin type: ______________________________ ? Amount (dose): _______________ Time (a.m./p.m.): _______________ Notes: ___________________________________  Insulin type: ______________________________ ? Amount (dose): _______________ Time (a.m./p.m.): _______________ Notes: ___________________________________ How do I manage my blood sugar?  Check your blood sugar levels using a blood glucose monitor as directed by your doctor. Your doctor will set treatment goals for you. Generally, you should have these blood sugar levels:  Before meals (preprandial): 80-130 mg/dL (4.4-7.2 mmol/L).  After meals (postprandial): below 180 mg/dL (10 mmol/L).  A1c level: less than 7%. Write down the times that you will check your blood sugar levels: Blood sugar checks  Time: _______________ Notes: ___________________________________  Time: _______________ Notes: ___________________________________  Time: _______________ Notes: ___________________________________  Time: _______________ Notes: ___________________________________  Time: _______________ Notes: ___________________________________  Time: _______________ Notes: ___________________________________  What do I need to know about low blood sugar? Low blood sugar is called hypoglycemia. This is when blood sugar is at or below 70 mg/dL (3.9 mmol/L). Symptoms may include:  Feeling: ? Hungry. ? Worried or nervous (anxious). ? Sweaty and clammy. ? Confused. ? Dizzy. ? Sleepy. ? Sick to your stomach (nauseous).  Having: ? A fast heartbeat. ? A headache. ? A change in your vision. ? Tingling or no feeling (numbness) around the mouth, lips, or tongue. ? Jerky movements that you cannot control (seizure).   Having trouble with: ? Moving (coordination). ? Sleeping. ? Passing out (fainting). ? Getting upset easily (irritability). Treating low blood sugar To treat low blood sugar, eat or drink something sugary right away. If you can think clearly and swallow safely, follow the 15:15 rule:  Take 15 grams of a fast-acting carb (carbohydrate). Talk with your doctor about how much you should take.  Some fast-acting carbs are: ? Sugar tablets (glucose pills). Take 3-4 glucose pills. ? 6-8 pieces of hard candy. ? 4-6 oz (120-150 mL) of fruit juice. ? 4-6 oz (120-150 mL) of regular (not diet) soda. ? 1 Tbsp (15 mL) honey or sugar.  Check your blood sugar 15 minutes after you take the carb.  If your blood sugar is still at or below 70 mg/dL (3.9 mmol/L), take 15 grams of a carb again.  If your blood sugar does not go above 70 mg/dL (3.9 mmol/L) after 3 tries, get help right away.  After your blood sugar goes back to normal, eat a  meal or a snack within 1 hour. Treating very low blood sugar If your blood sugar is at or below 54 mg/dL (3 mmol/L), you have very low blood sugar (severe hypoglycemia). This is an emergency. Do not wait to see if the symptoms will go away. Get medical help right away. Call your local emergency services (911 in the [place] Do not drive yourself to the hospital. Questions to ask your health care provider  Do I need to meet with a diabetes educator?  What equipment will I need to care for myself at home?  What diabetes medicines do I need? When should I take them?  How often do I need to check my blood sugar?  What number can I call if I have questions?  When is my next doctor's visit?  Where can I find a support group for people with diabetes? Where to find more information  American Diabetes Association: www.diabetes.org  American Association of Diabetes Educators: www.diabeteseducator.org/patient-resources Contact a doctor if:  Your blood sugar is at or  above 240 mg/dL (13.3 mmol/L) for 2 days in a row.  You have been sick or have had a fever for 2 days or more, and you are not getting better.  You have any of these problems for more than 6 hours: ? You cannot eat or drink. ? You feel sick to your stomach (nauseous). ? You throw up (vomit). ? You have watery poop (diarrhea). Get help right away if:  Your blood sugar is lower than 54 mg/dL (3 mmol/L).  You get confused.  You have trouble: ? Thinking clearly. ? Breathing. Summary  Diabetes (diabetes mellitus) is a long-term (chronic) disease. It occurs when the body does not properly use sugar (glucose) that is released from food after digestion.  Take insulin and diabetes medicines as told.  Check your blood sugar every day, as often as told.  Keep all follow-up visits as told by your doctor. This is important. This information is not intended to replace advice given to you by your health care provider. Make sure you discuss any questions you have with your health care provider. Document Released: 07/01/2017 Document Revised: 05/19/2018 Document Reviewed: 07/01/2017 Elsevier Patient Education  2020 [employer].

## 2019-02-15 ENCOUNTER — Other Ambulatory Visit: Payer: Self-pay | Admitting: Family Medicine

## 2019-02-15 ENCOUNTER — Encounter: Payer: Self-pay | Admitting: Family Medicine

## 2019-02-15 DIAGNOSIS — E559 Vitamin D deficiency, unspecified: Secondary | ICD-10-CM

## 2019-02-15 LAB — VITAMIN D 25 HYDROXY (VIT D DEFICIENCY, FRACTURES): Vit D, 25-Hydroxy: 16.9 ng/mL — ABNORMAL LOW (ref 30.0–100.0)

## 2019-02-15 LAB — VITAMIN B12: Vitamin B-12: 308 pg/mL (ref 232–1245)

## 2019-02-15 MED ORDER — VITAMIN D (ERGOCALCIFEROL) 1.25 MG (50000 UNIT) PO CAPS: 50000.0000 [IU] | ORAL_CAPSULE | ORAL | 6 refills | Status: DC

## 2019-02-15 MED FILL — VIT D2 1.25 MG (50,000 UNIT: 1.25 MG | 84 days supply | Qty: 12 | Fill #0

## 2019-02-17 DIAGNOSIS — R7303 Prediabetes: Secondary | ICD-10-CM | POA: Insufficient documentation

## 2019-02-17 DIAGNOSIS — E1165 Type 2 diabetes mellitus with hyperglycemia: Secondary | ICD-10-CM | POA: Insufficient documentation

## 2019-02-17 DIAGNOSIS — E119 Type 2 diabetes mellitus without complications: Secondary | ICD-10-CM | POA: Insufficient documentation

## 2019-02-17 DIAGNOSIS — Z1231 Encounter for screening mammogram for malignant neoplasm of breast: Secondary | ICD-10-CM | POA: Insufficient documentation

## 2019-02-19 ENCOUNTER — Other Ambulatory Visit: Payer: Self-pay

## 2019-02-19 ENCOUNTER — Other Ambulatory Visit: Payer: Self-pay | Admitting: Family Medicine

## 2019-02-19 DIAGNOSIS — J4520 Mild intermittent asthma, uncomplicated: Secondary | ICD-10-CM

## 2019-02-19 MED ORDER — FREESTYLE LITE TEST VI STRP: ORAL_STRIP | 12 refills | Status: DC

## 2019-02-19 MED ORDER — BLOOD GLUCOSE METER KIT: PACK | 0 refills | Status: DC

## 2019-02-19 MED FILL — NORTRIPTYLINE HCL 25 MG CAP: 25 | 30 days supply | Qty: 30 | Fill #1

## 2019-02-19 MED FILL — TRUE METRIX GLUCOSE TEST ST: 25 days supply | Qty: 100 | Fill #0

## 2019-02-19 MED FILL — $BREO ELLIPTA 200-25 MCG IN: 200-25 | 90 days supply | Qty: 180 | Fill #0

## 2019-02-19 MED FILL — ?RIZATRIPTAN 10 MG TABLET: 10 | 22 days supply | Qty: 10 | Fill #2

## 2019-02-19 MED FILL — LEVOCETIRIZINE 5 MG TABLET: 5 | 30 days supply | Qty: 30 | Fill #1

## 2019-02-19 MED FILL — TRUEplus LANCETS 28G MISC: 25 days supply | Qty: 100 | Fill #0

## 2019-02-19 MED FILL — GABAPENTIN 300 MG CAPSULE: 300 | 30 days supply | Qty: 60 | Fill #2

## 2019-03-02 ENCOUNTER — Other Ambulatory Visit: Payer: Self-pay

## 2019-03-02 DIAGNOSIS — E1165 Type 2 diabetes mellitus with hyperglycemia: Secondary | ICD-10-CM

## 2019-03-02 MED ORDER — BLOOD GLUCOSE METER KIT: PACK | 0 refills | Status: DC

## 2019-03-02 MED FILL — !TRUE METRIX BLOOD GLUCOSE: 30 days supply | Qty: 1 | Fill #0

## 2019-03-06 MED FILL — ?ONDANSETRON 4 MG TBDP: 4 | 20 days supply | Qty: 60 | Fill #3

## 2019-03-16 MED FILL — METOPROLOL SUCCINATE ER 100: 100 | 30 days supply | Qty: 30 | Fill #1

## 2019-03-16 MED FILL — metFORMIN HCL 500 MG TABS: 500 | 30 days supply | Qty: 60 | Fill #1

## 2019-03-16 MED FILL — PANTOPRAZOLE SOD DR 40 MG T: 40 | 30 days supply | Qty: 60 | Fill #3

## 2019-03-16 MED FILL — ?GLIPIZIDE 10 MG TABLET: 10 | 30 days supply | Qty: 60 | Fill #1

## 2019-03-19 ENCOUNTER — Ambulatory Visit (INDEPENDENT_AMBULATORY_CARE_PROVIDER_SITE_OTHER): Payer: Medicaid Other | Admitting: Otolaryngology

## 2019-03-21 ENCOUNTER — Telehealth: Payer: Self-pay | Admitting: Family Medicine

## 2019-03-22 MED FILL — ?RIZATRIPTAN 10 MG TABLET: 10 | 22 days supply | Qty: 10 | Fill #3

## 2019-03-22 MED FILL — NORTRIPTYLINE HCL 25 MG CAP: 25 | 30 days supply | Qty: 30 | Fill #0

## 2019-03-22 MED FILL — LEVOCETIRIZINE 5 MG TABLET: 5 | 30 days supply | Qty: 30 | Fill #2

## 2019-03-22 MED FILL — GABAPENTIN 300 MG CAPSULE: 300 | 30 days supply | Qty: 60 | Fill #3

## 2019-03-22 MED FILL — FLUTICASONE PROP 50 MCG SPR: 50 | 30 days supply | Qty: 16 | Fill #3

## 2019-03-22 MED FILL — $VENTOLIN HFA 18G INHALER: 108 (90 BAS | 25 days supply | Qty: 18 | Fill #1

## 2019-03-22 NOTE — Telephone Encounter (Signed)
done

## 2019-04-16 MED FILL — metFORMIN HCL 500 MG TABS: 500 | 30 days supply | Qty: 60 | Fill #2

## 2019-04-16 MED FILL — PANTOPRAZOLE SOD DR 40 MG T: 40 | 30 days supply | Qty: 60 | Fill #4

## 2019-04-16 MED FILL — ?GLIPIZIDE 10 MG TABLET: 10 | 30 days supply | Qty: 60 | Fill #2

## 2019-04-16 MED FILL — $VENTOLIN HFA 18G INHALER: 108 (90 BAS | 25 days supply | Qty: 18 | Fill #2

## 2019-04-16 MED FILL — METOPROLOL SUCCINATE ER 100: 100 | 30 days supply | Qty: 30 | Fill #2

## 2019-04-24 ENCOUNTER — Other Ambulatory Visit: Payer: Self-pay | Admitting: Neurology

## 2019-04-24 ENCOUNTER — Telehealth: Payer: Self-pay | Admitting: Family Medicine

## 2019-04-24 ENCOUNTER — Other Ambulatory Visit: Payer: Self-pay | Admitting: Gastroenterology

## 2019-04-24 ENCOUNTER — Other Ambulatory Visit: Payer: Self-pay | Admitting: Internal Medicine

## 2019-04-24 DIAGNOSIS — Z9109 Other allergy status, other than to drugs and biological substances: Secondary | ICD-10-CM

## 2019-04-24 MED FILL — NORTRIPTYLINE HCL 25 MG CAP: 25 | 30 days supply | Qty: 30 | Fill #1

## 2019-04-24 MED FILL — GABAPENTIN 300 MG CAPSULE: 300 | 30 days supply | Qty: 60 | Fill #4

## 2019-04-24 NOTE — Telephone Encounter (Signed)
Patient called back and was complaining of pain in legs and back. I advised to make an appointment for evaluation.

## 2019-04-24 NOTE — Telephone Encounter (Signed)
Called, no answer. Left a message to call back. Thanks!  

## 2019-04-25 MED FILL — ?RIZATRIPTAN 10 MG TABLET: 10 | 30 days supply | Qty: 10 | Fill #0

## 2019-04-25 MED FILL — ONDANSETRON ODT 4 MG TABLET: 4 | 20 days supply | Qty: 60 | Fill #0

## 2019-04-25 NOTE — Progress Notes (Signed)
PATIENT: Betty Cain DOB: Dec 15, 1975  REASON FOR VISIT: follow up HISTORY FROM: patient  HISTORY OF PRESENT ILLNESS: Today 04/26/19  Betty Cain is a 44 year old Arabic female with history of daily headache.  She has had MRI of the brain that was normal.  She remains on Aimovig, gabapentin, nortriptyline, Toprol, and Maxalt as needed.  She indicates her headaches are doing much better, she reports 5 headaches a month, are less intense, will go away within 2 hours.  Maxalt is beneficial.  She says she had a sleep study that was normal.  She does continue to complain of low back pain and pain in her legs.  She denies any falls or changes to the balance.  She does report she has hematuria and high protein in her urine, otherwise denies bowel or bladder incontinence.  She presents today for follow-up accompanied by interpreter.  HISTORY 12/14/2018 SS: Betty Cain is a 44 year old Arabic female with history of daily headache.  She has had MRI of the brain that was normal.  She has had elevation of sedimentation rate with unclear clinical significance.  She has had 3 Aimovig injections up to this point.  In the past she was having daily headache, now reports she is having 10 headache days a month.  She says she has had more than 50% improvement with Aimovig.  She remains on Effexor, gabapentin, and Toprol all which may work for migraine prevention.  She was prescribed Maxalt, but not take medication.  She will take Tylenol for headache, but is does not completely relieve the headache.  She reports with her headaches, they occur in the morning when she wakes.  She describes a dull pain, located occipitally, occasionally frontal.  She reports nausea, occasional vomiting.  She recently had a sleep evaluation last Friday, is waiting for the results.  She presents today for follow-up accompanied by an interpreter.   REVIEW OF SYSTEMS: Out of a complete 14 system review of symptoms, the patient complains only of  the following symptoms, and all other reviewed systems are negative.  Headache, back pain  ALLERGIES: Allergies  Allergen Reactions  . Topamax [Topiramate] Itching and Rash    HOME MEDICATIONS: Outpatient Medications Prior to Visit  Medication Sig Dispense Refill  . acetaminophen (TYLENOL) 500 MG tablet Take 1 tablet (500 mg total) by mouth every 6 (six) hours as needed. 30 tablet 0  . albuterol (PROVENTIL HFA;VENTOLIN HFA) 108 (90 Base) MCG/ACT inhaler Inhale 2 puffs into the lungs every 6 (six) hours as needed for wheezing or shortness of breath. 1 Inhaler 2  . blood glucose meter kit and supplies Dispense based on patient and insurance preference. Use up to four times daily as directed. (FOR ICD-10 E10.9, E11.9). 1 each 0  . Blood Pressure Monitoring DEVI 1 each by Does not apply route daily. 1 Device 0  . BREO ELLIPTA 100-25 MCG/INH AEPB INHALE 1 PUFF INTO THE LUNGS DAILY. 60 each 3  . Erenumab-aooe (AIMOVIG) 140 MG/ML SOAJ Inject 140 mg into the skin every 30 (thirty) days. 1 pen 11  . etonogestrel (NEXPLANON) 68 MG IMPL implant 1 each by Subdermal route once.    . ferrous sulfate (FEROSUL) 325 (65 FE) MG tablet TAKE 1 TABLET (325 MG TOTAL) BY MOUTH DAILY. 30 tablet 3  . fluticasone (FLONASE) 50 MCG/ACT nasal spray Place 2 sprays into both nostrils daily. 16 g 6  . gabapentin (NEURONTIN) 300 MG capsule Take 2 capsules in the evening 60 capsule 5  .  glipiZIDE (GLUCOTROL) 10 MG tablet Take 1 tablet (10 mg total) by mouth 2 (two) times daily before a meal. 60 tablet 3  . glucose blood (FREESTYLE LITE) test strip Use as instructed 100 each 12  . hydrOXYzine (ATARAX/VISTARIL) 10 MG tablet Take 1 tablet (10 mg total) by mouth 3 (three) times daily as needed. 30 tablet 2  . ipratropium (ATROVENT) 0.06 % nasal spray Place 2 sprays into both nostrils as needed for rhinitis.    Marland Kitchen levocetirizine (XYZAL) 5 MG tablet TAKE 1 TABLET (5 MG TOTAL) BY MOUTH EVERY EVENING. 30 tablet 2  . metFORMIN  (GLUCOPHAGE) 500 MG tablet Take 1 tablet (500 mg total) by mouth 2 (two) times daily with a meal. 180 tablet 3  . methylcellulose (CITRUCEL) oral powder Take as directed, daily    . metoprolol succinate (TOPROL XL) 100 MG 24 hr tablet Take 1 tablet (100 mg total) by mouth daily. Take with or immediately following a meal. 90 tablet 3  . Multiple Vitamin (MULTI VITAMIN DAILY PO) Take by mouth.    . nortriptyline (PAMELOR) 25 MG capsule Take 1 capsule (25 mg total) by mouth at bedtime. 30 capsule 1  . ondansetron (ZOFRAN-ODT) 4 MG disintegrating tablet TAKE 1 TABLET BY MOUTH EVERY 8 HOURS AS NEEDED FOR NAUSEA OR VOMITING. 60 tablet 0  . pantoprazole (PROTONIX) 40 MG tablet TAKE 1 TABLET BY MOUTH 2 TIMES DAILY BEFORE A MEAL. 180 tablet 1  . polyethylene glycol (MIRALAX) 17 g packet Take once to twice daily as needed 14 each 0  . potassium chloride 20 MEQ/15ML (10%) SOLN Take 15 mLs (20 mEq total) by mouth daily. 473 mL 2  . rizatriptan (MAXALT) 10 MG tablet TAKE 1 TABLET (10 MG TOTAL) BY MOUTH 3 (THREE) TIMES DAILY AS NEEDED FOR MIGRAINE. MAY REPEAT IN 2 HOURS IF NEEDED 10 tablet 0  . sucralfate (CARAFATE) 1 GM/10ML suspension Take 10 mLs (1 g total) by mouth every 6 (six) hours as needed. 420 mL 2  . Vitamin D, Ergocalciferol, (DRISDOL) 1.25 MG (50000 UT) CAPS capsule Take 1 capsule (50,000 Units total) by mouth every 7 (seven) days. 5 capsule 6   No facility-administered medications prior to visit.    PAST MEDICAL HISTORY: Past Medical History:  Diagnosis Date  . Allergy   . Anemia   . Anxiety 01/2019  . Asthma   . Common migraine with intractable migraine 06/28/2017  . Diabetes ([place]) 02/2019  . GERD (gastroesophageal reflux disease)    pos H pylori  . Hypertension    controlled with medication  . IBS (irritable bowel syndrome) 06-08-2003  . Neonatal death    Vaginal delivery, full term-lived x2 hours.   . Palpitations   . Shortness of breath   . Spinal headache   . Valvular heart disease     . Vitamin D deficiency 02/2019    PAST SURGICAL HISTORY: Past Surgical History:  Procedure Laterality Date  . ADENOIDECTOMY     and tonsils (as a child)  . boil  06-07-2001   right elbow  . CESAREAN SECTION     x4  . CESAREAN SECTION  06/02/2011   Procedure: CESAREAN SECTION;  Surgeon: [name], MD;  Location: [facility];  Service: Gynecology;  Laterality: N/A;  Primary Cesarean Section Delivery Baby Boy @ 0004, Apgars 9/9  . CESAREAN SECTION N/A 12/10/2013   Procedure: REPEAT CESAREAN SECTION;  Surgeon: [name], MD;  Location: [facility];  Service: Obstetrics;  Laterality: N/A;  .  colonoscopy  11/23/2018   stated had issues with sleep when in for procedure  . OTHER SURGICAL HISTORY     Uterine surgery   . VAGINA SURGERY  2005   uterine surgery    FAMILY HISTORY: Family History  Problem Relation Age of Onset  . Diabetes Mother   . Diabetes Father   . Diabetes Sister   . Anesthesia problems Neg Hx   . Colon cancer Neg Hx   . Esophageal cancer Neg Hx   . Rectal cancer Neg Hx   . Stomach cancer Neg Hx   . Colon polyps Neg Hx     SOCIAL HISTORY: Social History   Socioeconomic History  . Marital status: Married    Spouse name: [name]  . Number of children: 4  . Years of education: Not on file  . Highest education level: Not on file  Occupational History  . Occupation: Unemployed  Tobacco Use  . Smoking status: Never Smoker  . Smokeless tobacco: Never Used  Substance and Sexual Activity  . Alcohol use: No  . Drug use: No  . Sexual activity: Yes    Birth control/protection: None    Comment: pregnant  Other Topics Concern  . Not on file  Social History Narrative   Lives with husband and child   Caffeine use: daily (tea), sometimes coffee/soda   Right handed    Social Determinants of Health   Financial Resource Strain:   . Difficulty of Paying Living Expenses: Not on file  Food Insecurity:   . Worried About Charity fundraiser in the Last Year: Not on  file  . Ran Out of Food in the Last Year: Not on file  Transportation Needs:   . Lack of Transportation (Medical): Not on file  . Lack of Transportation (Non-Medical): Not on file  Physical Activity:   . Days of Exercise per Week: Not on file  . Minutes of Exercise per Session: Not on file  Stress:   . Feeling of Stress : Not on file  Social Connections:   . Frequency of Communication with Friends and Family: Not on file  . Frequency of Social Gatherings with Friends and Family: Not on file  . Attends Religious Services: Not on file  . Active Member of Clubs or Organizations: Not on file  . Attends Archivist Meetings: Not on file  . Marital Status: Not on file  Intimate Partner Violence:   . Fear of Current or Ex-Partner: Not on file  . Emotionally Abused: Not on file  . Physically Abused: Not on file  . Sexually Abused: Not on file   PHYSICAL EXAM  Vitals:   04/26/19 0841  BP: 117/77  Pulse: 100  Temp: (!) 97 F (36.1 C)  Weight: 164 lb 9.6 oz (74.7 kg)  Height: _0  (1.473 m)   Body mass index is 34.4 kg/m.  Generalized: Well developed, in no acute distress   Neurological examination  Mentation: Alert oriented to time, place, history taking (via interpreter). Follows all commands speech and language fluent Cranial nerve II-XII: Pupils were equal round reactive to light. Extraocular movements were full, visual field were full on confrontational test. Facial sensation and strength were normal. Head turning and shoulder shrug  were normal and symmetric. Motor: The motor testing reveals 5 over 5 strength of all 4 extremities. Good symmetric motor tone is noted throughout.  Sensory: Sensory testing is intact to soft touch on all 4 extremities. No evidence of extinction is  noted.  Coordination: Cerebellar testing reveals good finger-nose-finger and heel-to-shin bilaterally.  Gait and station: Gait is normal. Tandem gait is normal. Romberg is negative. No drift is  seen.  Reflexes: Deep tendon reflexes are symmetric and normal bilaterally.   DIAGNOSTIC DATA (LABS, IMAGING, TESTING) - I reviewed patient records, labs, notes, testing and imaging myself where available.  Lab Results  Component Value Date   WBC 5.6 08/30/2018   HGB 12.0 08/30/2018   HCT 37.9 08/30/2018   MCV 84.8 08/30/2018   PLT 331 08/30/2018      Component Value Date/Time   NA 137 11/13/2018 0946   K 4.5 11/13/2018 0946   CL 100 11/13/2018 0946   CO2 21 11/13/2018 0946   GLUCOSE 235 (H) 11/13/2018 0946   GLUCOSE 185 (H) 08/30/2018 1240   GLUCOSE 77 09/21/2013 1230   BUN 8 11/13/2018 0946   CREATININE 0.65 11/13/2018 0946   CREATININE 0.58 09/14/2016 0939   CALCIUM 9.7 11/13/2018 0946   PROT 7.5 11/13/2018 0946   ALBUMIN 4.5 11/13/2018 0946   AST 21 11/13/2018 0946   ALT 15 11/13/2018 0946   ALKPHOS 92 11/13/2018 0946   BILITOT <0.2 11/13/2018 0946   GFRNONAA 111 11/13/2018 0946   GFRNONAA >89 09/14/2016 0939   GFRAA 128 11/13/2018 0946   GFRAA >89 09/14/2016 0939   Lab Results  Component Value Date   CHOL 165 10/08/2014   HDL 30 (L) 10/08/2014   LDLCALC 114 (H) 10/08/2014   TRIG 107 10/08/2014   CHOLHDL 5.5 10/08/2014   Lab Results  Component Value Date   HGBA1C 8.8 (A) 02/14/2019   Lab Results  Component Value Date   VITAMINB12 308 02/14/2019   Lab Results  Component Value Date   TSH 2.230 11/13/2018   ASSESSMENT AND PLAN 44 y.o. year old female  has a past medical history of Allergy, Anemia, Anxiety (01/2019), Asthma, Common migraine with intractable migraine (06/28/2017), Diabetes ([place]) (02/2019), GERD (gastroesophageal reflux disease), Hypertension, IBS (irritable bowel syndrome) May 22, 2003), Neonatal death, Palpitations, Shortness of breath, Spinal headache, Valvular heart disease, and Vitamin D deficiency (02/2019). here with:  1.  Classic migraine headache 2.  Low back pain, leg pain  Her headaches are currently under good control, reports 5  headaches a month, are less intense, Maxalt works well.  When last seen, she reported 10 headache days a month, therefore at least 50% improvement following Aimovig.  She will remain on Aimovig, gabapentin, and Maxalt as needed.  Her gastroenterologist switched her off Effexor, to nortriptyline.  This seems to be helping.  She continues to complain of low back pain, and leg pain.  Prior x-ray of the back was relatively unremarkable, mild scoliosis, no significant arthritis.  I will place referral for neuromuscular therapy.  She will continue close follow-up with her primary doctor.  She will follow-up here in 6 months or sooner if needed.  I did advise if her symptoms worsen or she develops any new symptoms she should let us know.  I spent 15 minutes with the patient. 50% of this time was spent discussing her plan of care.  Butler Denmark, AGNP-C, DNP 04/26/2019, 8:58 AM [facility] [address], [place], [place] [address] [phone]

## 2019-04-26 ENCOUNTER — Other Ambulatory Visit: Payer: Self-pay

## 2019-04-26 ENCOUNTER — Ambulatory Visit: Payer: Medicaid Other | Admitting: Neurology

## 2019-04-26 ENCOUNTER — Encounter: Payer: Self-pay | Admitting: Neurology

## 2019-04-26 VITALS — BP 117/77 | HR 100 | Temp 97.0°F | Ht <= 58 in | Wt 164.6 lb

## 2019-04-26 DIAGNOSIS — F419 Anxiety disorder, unspecified: Secondary | ICD-10-CM

## 2019-04-26 DIAGNOSIS — M545 Low back pain: Secondary | ICD-10-CM

## 2019-04-26 DIAGNOSIS — G8929 Other chronic pain: Secondary | ICD-10-CM

## 2019-04-26 DIAGNOSIS — G43019 Migraine without aura, intractable, without status migrainosus: Secondary | ICD-10-CM

## 2019-04-26 MED ORDER — GABAPENTIN 300 MG PO CAPS: ORAL_CAPSULE | ORAL | 5 refills | Status: DC

## 2019-04-26 MED ORDER — RIZATRIPTAN BENZOATE 10 MG PO TABS: 10.0000 mg | ORAL_TABLET | Freq: Three times a day (TID) | ORAL | 5 refills | Status: DC | PRN

## 2019-04-26 MED FILL — LEVOCETIRIZINE 5 MG TABLET: 5 | 30 days supply | Qty: 30 | Fill #0

## 2019-04-26 NOTE — Progress Notes (Signed)
I have read the note, and I agree with the clinical assessment and plan.  Renaye Janicki K Lakina Mcintire   

## 2019-04-26 NOTE — Patient Instructions (Signed)
I am glad your headaches are better, continue current medications, Aimovig, gabapentin, maxalt as needed for your headache   Continue follow-up with your primary doctor

## 2019-04-27 ENCOUNTER — Ambulatory Visit: Payer: Medicaid Other | Admitting: Nurse Practitioner

## 2019-04-30 ENCOUNTER — Encounter: Payer: Self-pay | Admitting: Neurology

## 2019-05-08 ENCOUNTER — Other Ambulatory Visit: Payer: Self-pay | Admitting: Family Medicine

## 2019-05-08 DIAGNOSIS — D649 Anemia, unspecified: Secondary | ICD-10-CM

## 2019-05-08 MED FILL — VIT D2 1.25 MG (50,000 UNIT: 1.25 MG | 84 days supply | Qty: 12 | Fill #1

## 2019-05-08 MED FILL — FLUTICASONE PROP 50 MCG SPR: 50 | 30 days supply | Qty: 16 | Fill #4

## 2019-05-08 MED FILL — CARAFATE 1 GM/10 ML SUSP: 1 | 10 days supply | Qty: 420 | Fill #2

## 2019-05-08 MED FILL — FERROUS SULFATE 325 MG TAB: 325 (65 FE) | 30 days supply | Qty: 30 | Fill #0

## 2019-05-08 MED FILL — POTASSIUM CL 10% (20 MEQ/15: 20 MEQ/15ML | 31 days supply | Qty: 473 | Fill #2

## 2019-05-10 ENCOUNTER — Ambulatory Visit (INDEPENDENT_AMBULATORY_CARE_PROVIDER_SITE_OTHER): Payer: Medicaid Other | Admitting: Family Medicine

## 2019-05-16 ENCOUNTER — Ambulatory Visit: Payer: Medicaid Other

## 2019-05-16 ENCOUNTER — Other Ambulatory Visit: Payer: Self-pay

## 2019-05-16 ENCOUNTER — Ambulatory Visit: Payer: Self-pay | Attending: Family Medicine

## 2019-05-18 ENCOUNTER — Ambulatory Visit: Payer: Medicaid Other | Admitting: Family Medicine

## 2019-05-21 ENCOUNTER — Ambulatory Visit (INDEPENDENT_AMBULATORY_CARE_PROVIDER_SITE_OTHER): Payer: Self-pay | Admitting: Nurse Practitioner

## 2019-05-21 ENCOUNTER — Encounter: Payer: Self-pay | Admitting: Nurse Practitioner

## 2019-05-21 ENCOUNTER — Other Ambulatory Visit: Payer: Self-pay

## 2019-05-21 VITALS — BP 115/70 | HR 98 | Temp 99.0°F | Resp 16 | Ht <= 58 in | Wt 161.0 lb

## 2019-05-21 DIAGNOSIS — Z23 Encounter for immunization: Secondary | ICD-10-CM

## 2019-05-21 DIAGNOSIS — Z6833 Body mass index (BMI) 33.0-33.9, adult: Secondary | ICD-10-CM

## 2019-05-21 DIAGNOSIS — E1165 Type 2 diabetes mellitus with hyperglycemia: Secondary | ICD-10-CM

## 2019-05-21 DIAGNOSIS — R52 Pain, unspecified: Secondary | ICD-10-CM

## 2019-05-21 DIAGNOSIS — K5900 Constipation, unspecified: Secondary | ICD-10-CM

## 2019-05-21 DIAGNOSIS — R000 Tachycardia, unspecified: Secondary | ICD-10-CM

## 2019-05-21 LAB — POCT URINALYSIS DIPSTICK
Bilirubin, UA: NEGATIVE
Blood, UA: NEGATIVE
Glucose, UA: NEGATIVE
Ketones, UA: NEGATIVE
Leukocytes, UA: NEGATIVE
Nitrite, UA: NEGATIVE
Protein, UA: POSITIVE — AB
Spec Grav, UA: >=1.03 — AB (ref 1.010–1.025)
Urobilinogen, UA: 0.2 E.U./dL
pH, UA: 5 (ref 5.0–8.0)

## 2019-05-21 LAB — POCT GLYCOSYLATED HEMOGLOBIN (HGB A1C): Hemoglobin A1C: 6.8 % — AB (ref 4.0–5.6)

## 2019-05-21 MED ORDER — MELOXICAM 7.5 MG PO TABS: 7.5000 mg | ORAL_TABLET | Freq: Every day | ORAL | 0 refills | Status: DC

## 2019-05-21 MED FILL — MELOXICAM 7.5 MG TABLET: 7.5 | 30 days supply | Qty: 30 | Fill #0

## 2019-05-21 NOTE — Progress Notes (Signed)
Established Patient Office Visit  Subjective:  Patient ID: Betty Cain, female    DOB: 1976/02/12  Age: 44 y.o. MRN: [id number]  CC:  Chief Complaint  Patient presents with  . Diabetes  . Numbness    in both feet   . Medication Management    wants to talk about switching potassium to tablet     HPI Betty Cain presents for for follow-up.  She has a history of type 2 diabetes, palpitations, anemia, vertigo, migraines and GERD. She is having body aches. She states that her body hurts so much. She been having this since the end of Jan. Denies fever.  She does have chills, shortness of breath at rest . The SOB has been going on for 2 years.  She has chest pain. She has had a cardiac work up. She is seeing the cardiologist for a while. All test were normal. She also complains of swelling in her legs each morning. She does not sleep well.  She wakes up tired. She is status post sleep study; negative.  She has migraines which she follows with neurology.  She is using Erenumab 140 mg injection every 30 days for the migraines along with pills; rizatriptan.  She admits that she also has generalized abdominal pain. She has abdominal EGD x2. She has problems with anesthesia. She was diagnoses with polyps 06/17/18.  She is on several medications reviewed She denies any anxiety or depression.  She admits that she does have 4 children. Past Medical History:  Diagnosis Date  . Allergy   . Anemia   . Anxiety 01/2019  . Asthma   . Common migraine with intractable migraine 06/28/2017  . Diabetes ([place]) 02/2019  . GERD (gastroesophageal reflux disease)    pos H pylori  . Hypertension    controlled with medication  . IBS (irritable bowel syndrome) Jun 18, 2003  . Neonatal death    Vaginal delivery, full term-lived x2 hours.   . Palpitations   . Shortness of breath   . Spinal headache   . Valvular heart disease   . Vitamin D deficiency 02/2019    Past Surgical History:  Procedure Laterality Date  .  ADENOIDECTOMY     and tonsils (as a child)  . boil  June 17, 2001   right elbow  . CESAREAN SECTION     x4  . CESAREAN SECTION  06/02/2011   Procedure: CESAREAN SECTION;  Surgeon: [name], MD;  Location: [facility];  Service: Gynecology;  Laterality: N/A;  Primary Cesarean Section Delivery Baby Boy @ 0004, Apgars 9/9  . CESAREAN SECTION N/A 12/10/2013   Procedure: REPEAT CESAREAN SECTION;  Surgeon: [name], MD;  Location: [facility];  Service: Obstetrics;  Laterality: N/A;  . colonoscopy  11/23/2018   stated had issues with sleep when in for procedure  . OTHER SURGICAL HISTORY     Uterine surgery   . VAGINA SURGERY  06-18-03   uterine surgery    Family History  Problem Relation Age of Onset  . Diabetes Mother   . Diabetes Father   . Diabetes Sister   . Anesthesia problems Neg Hx   . Colon cancer Neg Hx   . Esophageal cancer Neg Hx   . Rectal cancer Neg Hx   . Stomach cancer Neg Hx   . Colon polyps Neg Hx     Social History   Socioeconomic History  . Marital status: Married    Spouse name: [name]  . Number of children: 4  .  Years of education: Not on file  . Highest education level: Not on file  Occupational History  . Occupation: Unemployed  Tobacco Use  . Smoking status: Never Smoker  . Smokeless tobacco: Never Used  Substance and Sexual Activity  . Alcohol use: No  . Drug use: No  . Sexual activity: Yes    Birth control/protection: None    Comment: pregnant  Other Topics Concern  . Not on file  Social History Narrative   Lives with husband and child   Caffeine use: daily (tea), sometimes coffee/soda   Right handed    Social Determinants of Health   Financial Resource Strain:   . Difficulty of Paying Living Expenses: Not on file  Food Insecurity:   . Worried About Charity fundraiser in the Last Year: Not on file  . Ran Out of Food in the Last Year: Not on file  Transportation Needs:   . Lack of Transportation (Medical): Not on file  . Lack of  Transportation (Non-Medical): Not on file  Physical Activity:   . Days of Exercise per Week: Not on file  . Minutes of Exercise per Session: Not on file  Stress:   . Feeling of Stress : Not on file  Social Connections:   . Frequency of Communication with Friends and Family: Not on file  . Frequency of Social Gatherings with Friends and Family: Not on file  . Attends Religious Services: Not on file  . Active Member of Clubs or Organizations: Not on file  . Attends Archivist Meetings: Not on file  . Marital Status: Not on file  Intimate Partner Violence:   . Fear of Current or Ex-Partner: Not on file  . Emotionally Abused: Not on file  . Physically Abused: Not on file  . Sexually Abused: Not on file    Outpatient Medications Prior to Visit  Medication Sig Dispense Refill  . acetaminophen (TYLENOL) 500 MG tablet Take 1 tablet (500 mg total) by mouth every 6 (six) hours as needed. 30 tablet 0  . albuterol (PROVENTIL HFA;VENTOLIN HFA) 108 (90 Base) MCG/ACT inhaler Inhale 2 puffs into the lungs every 6 (six) hours as needed for wheezing or shortness of breath. 1 Inhaler 2  . blood glucose meter kit and supplies Dispense based on patient and insurance preference. Use up to four times daily as directed. (FOR ICD-10 E10.9, E11.9). 1 each 0  . Blood Pressure Monitoring DEVI 1 each by Does not apply route daily. 1 Device 0  . BREO ELLIPTA 100-25 MCG/INH AEPB INHALE 1 PUFF INTO THE LUNGS DAILY. 60 each 3  . dicyclomine (BENTYL) 10 MG capsule Take 10 mg by mouth.    [name] (AIMOVIG) 140 MG/ML SOAJ Inject 140 mg into the skin every 30 (thirty) days. 1 pen 11  . etonogestrel (NEXPLANON) 68 MG IMPL implant 1 each by Subdermal route once.    . FEROSUL 325 (65 Fe) MG tablet TAKE 1 TABLET (325 MG TOTAL) BY MOUTH DAILY. 30 tablet 3  . fluticasone (FLONASE) 50 MCG/ACT nasal spray Place 2 sprays into both nostrils daily. 16 g 6  . gabapentin (NEURONTIN) 300 MG capsule Take 2 capsules in  the evening 60 capsule 5  . glipiZIDE (GLUCOTROL) 10 MG tablet Take 1 tablet (10 mg total) by mouth 2 (two) times daily before a meal. 60 tablet 3  . glucose blood (FREESTYLE LITE) test strip Use as instructed 100 each 12  . hydrOXYzine (ATARAX/VISTARIL) 10 MG tablet Take 1  tablet (10 mg total) by mouth 3 (three) times daily as needed. 30 tablet 2  . levocetirizine (XYZAL) 5 MG tablet TAKE 1 TABLET (5 MG TOTAL) BY MOUTH EVERY EVENING. 30 tablet 2  . metFORMIN (GLUCOPHAGE) 500 MG tablet Take 1 tablet (500 mg total) by mouth 2 (two) times daily with a meal. 180 tablet 3  . methylcellulose (CITRUCEL) oral powder Take as directed, daily    . metoprolol succinate (TOPROL XL) 100 MG 24 hr tablet Take 1 tablet (100 mg total) by mouth daily. Take with or immediately following a meal. 90 tablet 3  . Multiple Vitamin (MULTI VITAMIN DAILY PO) Take by mouth.    . nortriptyline (PAMELOR) 25 MG capsule Take 1 capsule (25 mg total) by mouth at bedtime. 30 capsule 1  . ondansetron (ZOFRAN-ODT) 4 MG disintegrating tablet TAKE 1 TABLET BY MOUTH EVERY 8 HOURS AS NEEDED FOR NAUSEA OR VOMITING. 60 tablet 0  . pantoprazole (PROTONIX) 40 MG tablet TAKE 1 TABLET BY MOUTH 2 TIMES DAILY BEFORE A MEAL. 180 tablet 1  . polyethylene glycol (MIRALAX) 17 g packet Take once to twice daily as needed 14 each 0  . potassium chloride 20 MEQ/15ML (10%) SOLN Take 15 mLs (20 mEq total) by mouth daily. 473 mL 2  . rizatriptan (MAXALT) 10 MG tablet Take 1 tablet (10 mg total) by mouth 3 (three) times daily as needed for migraine. May repeat in 2 hours if needed 10 tablet 5  . sucralfate (CARAFATE) 1 GM/10ML suspension Take 10 mLs (1 g total) by mouth every 6 (six) hours as needed. 420 mL 2  . Vitamin D, Ergocalciferol, (DRISDOL) 1.25 MG (50000 UT) CAPS capsule Take 1 capsule (50,000 Units total) by mouth every 7 (seven) days. 5 capsule 6  . ipratropium (ATROVENT) 0.06 % nasal spray Place 2 sprays into both nostrils as needed for rhinitis.      No facility-administered medications prior to visit.    Allergies  Allergen Reactions  . Topamax [Topiramate] Itching and Rash    ROS Review of Systems  Constitutional: Positive for chills.  Respiratory: Positive for shortness of breath.   Cardiovascular: Positive for chest pain and leg swelling.       Occasional  Gastrointestinal: Positive for abdominal pain and constipation.       Generalized  Endocrine: Negative.   Genitourinary: Negative.   Musculoskeletal: Negative.        Generalized body aches  Skin: Negative.   Allergic/Immunologic: Negative.   Neurological: Positive for headaches.  Hematological: Negative.   Psychiatric/Behavioral: Negative.       Objective:    Physical Exam  Constitutional: She is oriented to person, place, and time. She appears well-developed and well-nourished.  HENT:  Head: Normocephalic.  Cardiovascular: Normal rate, regular rhythm, normal heart sounds and intact distal pulses.  Diminished pulses  Pulmonary/Chest: Effort normal and breath sounds normal.  Abdominal:  Obese hypoactive  Musculoskeletal:        General: Normal range of motion.     Cervical back: Normal range of motion and neck supple.  Neurological: She is alert and oriented to person, place, and time.  Skin: Skin is warm and dry.  Psychiatric: She has a normal mood and affect. Her behavior is normal. Judgment and thought content normal.    BP 115/70 (BP Location: Right Arm, Patient Position: Sitting, Cuff Size: Large)   Pulse 98   Temp 99 F (37.2 C) (Oral)   Resp 16   Ht 4' 10"  (1.473  m)   Wt 161 lb (73 kg)   LMP 05/04/2019   SpO2 98%   BMI 33.65 kg/m  Wt Readings from Last 3 Encounters:  05/21/19 161 lb (73 kg)  04/26/19 164 lb 9.6 oz (74.7 kg)  02/14/19 166 lb 9.6 oz (75.6 kg)     Health Maintenance Due  Topic Date Due  . PNEUMOCOCCAL POLYSACCHARIDE VACCINE AGE 6-64 HIGH RISK  11/10/1979  . FOOT EXAM  11/10/1987  . OPHTHALMOLOGY EXAM  11/10/1987   . URINE MICROALBUMIN  11/10/1987  . PAP SMEAR-Modifier  06/19/2016    There are no preventive care reminders to display for this patient.  Lab Results  Component Value Date   TSH 2.230 11/13/2018   Lab Results  Component Value Date   WBC 5.6 08/30/2018   HGB 12.0 08/30/2018   HCT 37.9 08/30/2018   MCV 84.8 08/30/2018   PLT 331 08/30/2018   Lab Results  Component Value Date   NA 137 11/13/2018   K 4.5 11/13/2018   CO2 21 11/13/2018   GLUCOSE 235 (H) 11/13/2018   BUN 8 11/13/2018   CREATININE 0.65 11/13/2018   BILITOT <0.2 11/13/2018   ALKPHOS 92 11/13/2018   AST 21 11/13/2018   ALT 15 11/13/2018   PROT 7.5 11/13/2018   ALBUMIN 4.5 11/13/2018   CALCIUM 9.7 11/13/2018   ANIONGAP 11 08/30/2018   GFR 119.42 08/28/2018   Lab Results  Component Value Date   CHOL 165 10/08/2014   Lab Results  Component Value Date   HDL 30 (L) 10/08/2014   Lab Results  Component Value Date   LDLCALC 114 (H) 10/08/2014   Lab Results  Component Value Date   TRIG 107 10/08/2014   Lab Results  Component Value Date   CHOLHDL 5.5 10/08/2014   Lab Results  Component Value Date   HGBA1C 6.8 (A) 05/21/2019      Assessment & Plan:   Problem List Items Addressed This Visit      Unprioritized   Constipation   Sinus tachycardia   Type 2 diabetes mellitus with hyperglycemia, without long-term current use of insulin (HCC) - Primary   Relevant Orders   HgB A1c (Completed)   Urinalysis Dipstick (Completed)   Microalbumin, urine   Lipid Panel   Comprehensive metabolic panel   Comp. Metabolic Panel (12)    Other Visit Diagnoses    Generalized pain       We will try meloxicam 7.5 mg daily   Relevant Orders   Sedimentation rate   C-reactive protein   Body mass index (BMI) of 33.0-33.9 in adult       Diet and exercise encouraged      Meds ordered this encounter  Medications  . meloxicam (MOBIC) 7.5 MG tablet    Sig: Take 1 tablet (7.5 mg total) by mouth daily.     Dispense:  30 tablet    Refill:  0    Order Specific Question:   Supervising Provider    Answer:   Tresa Garter [id number]    Follow-up: Return in about 3 months (around 08/18/2019).    Vevelyn Francois, NP

## 2019-05-21 NOTE — Patient Instructions (Signed)
Musculoskeletal Pain Musculoskeletal pain refers to aches and pains in your bones, joints, muscles, and the tissues that surround them. This pain can occur in any part of the body. It can last for a short time (acute) or a long time (chronic). A physical exam, lab tests, and imaging studies may be done to find the cause of your musculoskeletal pain. Follow these instructions at home:  Lifestyle  Try to control or lower your stress levels. Stress increases muscle tension and can worsen musculoskeletal pain. It is important to recognize when you are anxious or stressed and learn ways to manage it. This may include: ? Meditation or yoga. ? Cognitive or behavioral therapy. ? Acupuncture or massage therapy.  You may continue all activities unless the activities cause more pain. When the pain gets better, slowly resume your normal activities. Gradually increase the intensity and duration of your activities or exercise. Managing pain, stiffness, and swelling  Take over-the-counter and prescription medicines only as told by your health care provider.  When your pain is severe, bed rest may be helpful. Lie or sit in any position that is comfortable, but get out of bed and walk around at least every couple of hours.  If directed, apply heat to the affected area as often as told by your health care provider. Use the heat source that your health care provider recommends, such as a moist heat pack or a heating pad. ? Place a towel between your skin and the heat source. ? Leave the heat on for 20-30 minutes. ? Remove the heat if your skin turns bright red. This is especially important if you are unable to feel pain, heat, or cold. You may have a greater risk of getting burned.  If directed, put ice on the painful area. ? Put ice in a plastic bag. ? Place a towel between your skin and the bag. ? Leave the ice on for 20 minutes, 2-3 times a day. General instructions  Your health care provider may  recommend that you see a physical therapist. This person can help you come up with a safe exercise program. Do any exercises as told by your physical therapist.  Keep all follow-up visits, including any physical therapy visits, as told by your health care providers. This is important. Contact a health care provider if:  Your pain gets worse.  Medicines do not help ease your pain.  You cannot use the part of your body that hurts, such as your arm, leg, or neck.  You have trouble sleeping.  You have trouble doing your normal activities. Get help right away if:  You have a new injury and your pain is worse or different.  You feel numb or you have tingling in the painful area. Summary  Musculoskeletal pain refers to aches and pains in your bones, joints, muscles, and the tissues that surround them.  This pain can occur in any part of the body.  Your health care provider may recommend that you see a physical therapist. This person can help you come up with a safe exercise program. Do any exercises as told by your physical therapist.  Lower your stress level. Stress can worsen musculoskeletal pain. Ways to lower stress may include meditation, yoga, cognitive or behavioral therapy, acupuncture, and massage therapy. This information is not intended to replace advice given to you by your health care provider. Make sure you discuss any questions you have with your health care provider. Document Revised: 03/11/2017 Document Reviewed: 04/28/2016 Elsevier Patient   Education  2020 [employer]  

## 2019-05-22 LAB — COMPREHENSIVE METABOLIC PANEL
ALT: 14 IU/L (ref 0–32)
AST: 12 IU/L (ref 0–40)
Albumin/Globulin Ratio: 1.6 (ref 1.2–2.2)
Albumin: 4.7 g/dL (ref 3.8–4.8)
Alkaline Phosphatase: 97 IU/L (ref 39–117)
BUN/Creatinine Ratio: 13 (ref 9–23)
BUN: 11 mg/dL (ref 6–24)
Bilirubin Total: 0.4 mg/dL (ref 0.0–1.2)
CO2: 21 mmol/L (ref 20–29)
Calcium: 9.8 mg/dL (ref 8.7–10.2)
Chloride: 102 mmol/L (ref 96–106)
Creatinine, Ser: 0.85 mg/dL (ref 0.57–1.00)
GFR calc Af Amer: 98 mL/min/{1.73_m2} (ref >59)
GFR calc non Af Amer: 85 mL/min/{1.73_m2} (ref >59)
Globulin, Total: 3 g/dL (ref 1.5–4.5)
Glucose: 162 mg/dL — ABNORMAL HIGH (ref 65–99)
Potassium: 4.3 mmol/L (ref 3.5–5.2)
Sodium: 140 mmol/L (ref 134–144)
Total Protein: 7.7 g/dL (ref 6.0–8.5)

## 2019-05-22 LAB — SEDIMENTATION RATE: Sed Rate: 43 mm/hr — ABNORMAL HIGH (ref 0–32)

## 2019-05-22 LAB — LIPID PANEL
Chol/HDL Ratio: 6.1 ratio — ABNORMAL HIGH (ref 0.0–4.4)
Cholesterol, Total: 190 mg/dL (ref 100–199)
HDL: 31 mg/dL — ABNORMAL LOW (ref >39)
LDL Chol Calc (NIH): 134 mg/dL — ABNORMAL HIGH (ref 0–99)
Triglycerides: 138 mg/dL (ref 0–149)
VLDL Cholesterol Cal: 25 mg/dL (ref 5–40)

## 2019-05-22 LAB — C-REACTIVE PROTEIN: CRP: 35 mg/L — ABNORMAL HIGH (ref 0–10)

## 2019-05-22 LAB — MICROALBUMIN, URINE: Microalbumin, Urine: 98.6 ug/mL

## 2019-05-23 ENCOUNTER — Telehealth: Payer: Self-pay

## 2019-05-23 ENCOUNTER — Encounter: Payer: Self-pay | Admitting: Nurse Practitioner

## 2019-05-23 ENCOUNTER — Other Ambulatory Visit: Payer: Self-pay | Admitting: Nurse Practitioner

## 2019-05-23 DIAGNOSIS — R7982 Elevated C-reactive protein (CRP): Secondary | ICD-10-CM

## 2019-05-23 DIAGNOSIS — E785 Hyperlipidemia, unspecified: Secondary | ICD-10-CM | POA: Insufficient documentation

## 2019-05-23 DIAGNOSIS — E119 Type 2 diabetes mellitus without complications: Secondary | ICD-10-CM | POA: Insufficient documentation

## 2019-05-23 DIAGNOSIS — R700 Elevated erythrocyte sedimentation rate: Secondary | ICD-10-CM | POA: Insufficient documentation

## 2019-05-23 DIAGNOSIS — R809 Proteinuria, unspecified: Secondary | ICD-10-CM

## 2019-05-23 MED ORDER — ROSUVASTATIN CALCIUM 5 MG PO TABS: 5.0000 mg | ORAL_TABLET | Freq: Every day | ORAL | 11 refills | Status: DC

## 2019-05-23 MED FILL — ROSUVASTATIN CALCIUM 5 MG T: 5 | 30 days supply | Qty: 30 | Fill #0

## 2019-05-23 NOTE — Telephone Encounter (Signed)
-----   Message from Vevelyn Francois, NP sent at 05/23/2019  9:31 AM EST ----- MyChart message sent  Betty Cain, Your  A1c is 6.8%.  This has improved from 8.8% 3 months ago.  Great job! We would like to see this continue to decrease.  Your sed rate and C-reactive protein are slightly elevated I will add on some additional labs test to evaluate for the generalized you are pain that you are having.  We will then let you know what the next steps will be.    Your overall cholesterol is within normal range however her good cholesterol is low and her bad cholesterol is high.  When you have diabetes this needs to be treated. I would like to try low-dose Crestor.  This is protective for your heart.  I do encourage exercise which will also help with your overall health. You have protein in your urine and this has happened on several occasions.  This maybe a normal occurrence for you however a referral to nephrology is recommended.  I will put the referral in for evaluation of proteinuria. Thank you and if you have any questions please do not hesitate to call our office or you can email me.Have a great day

## 2019-05-23 NOTE — Telephone Encounter (Signed)
Called and spoke with Patient using [employer] [phone].  Advised that A1c has improved to 6.8% and she is doing a great job with this. Advised that sed rate and C-reactive protein are slightly elevated and that we are adding some additional labs and will let her know the results of this. Advised that cholesterol the bad and good cholesterol are out of range and we have sent in Crestor to take once daily to help protect her heart. Asked that she try to include exercise in her daily routine as tolerated. Informed that protein is in urine and this has happened in the past and we would like her to be sent to nephrology for evaluation of this. Advised we will send referral and they will call to schedule an appointment. Thanks!

## 2019-05-24 ENCOUNTER — Ambulatory Visit (INDEPENDENT_AMBULATORY_CARE_PROVIDER_SITE_OTHER): Payer: Medicaid Other | Admitting: Family Medicine

## 2019-05-27 LAB — SPECIMEN STATUS REPORT

## 2019-05-27 LAB — ANA W/REFLEX IF POSITIVE: Anti Nuclear Antibody (ANA): NEGATIVE

## 2019-05-27 LAB — RHEUMATOID FACTOR: Rheumatoid fact SerPl-aCnc: <10 IU/mL (ref 0.0–13.9)

## 2019-05-30 ENCOUNTER — Other Ambulatory Visit: Payer: Self-pay | Admitting: Gastroenterology

## 2019-05-30 ENCOUNTER — Other Ambulatory Visit: Payer: Self-pay | Admitting: Family Medicine

## 2019-05-30 MED FILL — TRUEplus LANCETS 28G MISC: 25 days supply | Qty: 100 | Fill #0

## 2019-05-30 MED FILL — GABAPENTIN 300 MG CAPSULE: 300 | 30 days supply | Qty: 60 | Fill #5

## 2019-05-30 MED FILL — TRUE METRIX TEST STRIP: 25 days supply | Qty: 100 | Fill #0

## 2019-05-30 MED FILL — ?RIZATRIPTAN 10 MG TABLET: 10 | 30 days supply | Qty: 9 | Fill #0

## 2019-05-30 MED FILL — ?METFORMIN HCL 500MG TABLET: 500 | 30 days supply | Qty: 60 | Fill #3

## 2019-05-30 MED FILL — METOPROLOL SUCCINATE ER 100: 100 | 30 days supply | Qty: 30 | Fill #3

## 2019-05-30 MED FILL — LEVOCETIRIZINE 5 MG TABLET: 5 | 30 days supply | Qty: 30 | Fill #1

## 2019-05-30 MED FILL — NORTRIPTYLINE HCL 25 MG CAP: 25 | 30 days supply | Qty: 30 | Fill #0

## 2019-05-30 MED FILL — ?GLIPIZIDE 10 MG TABLET: 10 | 30 days supply | Qty: 60 | Fill #3

## 2019-06-14 ENCOUNTER — Encounter: Payer: Self-pay | Admitting: Nurse Practitioner

## 2019-06-14 ENCOUNTER — Other Ambulatory Visit: Payer: Self-pay

## 2019-06-14 ENCOUNTER — Ambulatory Visit (INDEPENDENT_AMBULATORY_CARE_PROVIDER_SITE_OTHER): Payer: Self-pay | Admitting: Nurse Practitioner

## 2019-06-14 VITALS — BP 123/79 | HR 97 | Temp 98.5°F | Ht 59.0 in | Wt 164.0 lb

## 2019-06-14 DIAGNOSIS — R200 Anesthesia of skin: Secondary | ICD-10-CM

## 2019-06-14 DIAGNOSIS — E119 Type 2 diabetes mellitus without complications: Secondary | ICD-10-CM

## 2019-06-14 DIAGNOSIS — R809 Proteinuria, unspecified: Secondary | ICD-10-CM | POA: Insufficient documentation

## 2019-06-14 DIAGNOSIS — R070 Pain in throat: Secondary | ICD-10-CM

## 2019-06-14 DIAGNOSIS — R1032 Left lower quadrant pain: Secondary | ICD-10-CM

## 2019-06-14 DIAGNOSIS — R1011 Right upper quadrant pain: Secondary | ICD-10-CM

## 2019-06-14 LAB — POCT GLYCOSYLATED HEMOGLOBIN (HGB A1C): Hemoglobin A1C: 6.8 % — AB (ref 4.0–5.6)

## 2019-06-14 LAB — POCT URINALYSIS DIPSTICK
Bilirubin, UA: NEGATIVE
Glucose, UA: NEGATIVE
Ketones, UA: NEGATIVE
Leukocytes, UA: NEGATIVE
Nitrite, UA: NEGATIVE
Protein, UA: POSITIVE — AB
Spec Grav, UA: 1.02 (ref 1.010–1.025)
Urobilinogen, UA: 0.2 E.U./dL
pH, UA: 5 (ref 5.0–8.0)

## 2019-06-14 LAB — GLUCOSE, POCT (MANUAL RESULT ENTRY): POC Glucose: 220 mg/dl — AB (ref 70–99)

## 2019-06-14 MED ORDER — ONDANSETRON 8 MG PO TBDP: ORAL_TABLET | ORAL | 0 refills | Status: DC

## 2019-06-14 MED FILL — ONDANSETRON ODT 8 MG TABLET: 8 | 10 days supply | Qty: 30 | Fill #0

## 2019-06-14 NOTE — Progress Notes (Signed)
Established Patient Office Visit  Subjective:  Patient ID: Betty Cain, female    DOB: 1975/10/12  Age: 44 y.o. MRN: [id number]  CC:  Chief Complaint  Patient presents with  . Follow-up    DM2  . Foot Pain    both feet   . Dysuria  . Abdominal Pain    upper mid abdomen pain, left side pain    HPI Betty Cain presents for abdominal pain.  She  has a past medical history of Allergy, Anemia, Anxiety (01/2019), Asthma, Common migraine with intractable migraine (06/28/2017), Diabetes ([place]) (02/2019), GERD (gastroesophageal reflux disease), Hypertension, IBS (irritable bowel syndrome) June 26, 2003), Neonatal death, Palpitations, Shortness of breath, Spinal headache, Valvular heart disease, and Vitamin D deficiency (02/2019).  Abdominal pain onset was 2 days ago. Symptoms have been come and goes and getting. The pain is described as cramping, and is 9/10 in intensity. Pain is located in the RUQ and LLQ .  Aggravating factors:nausea, epigastric reflux.   Alleviating factors: no treatment. Associated symptoms: nausea , chest discomfort and shortness of breath. She feels like she is having some palpitations.  The patient denies fever, chills, vomiting, no change in stress. Her last menstrual cycle was Jan 25. Lasted for 15 days not normal.   [facility]. She has dysuria. denies frequency. Questionable hestiancy. She drinks a lot but continues to have a dry mouth. She drinks water. She is status post Colonoscopy and partial EGD  She is complaining of bilateral foot pain with the right being greater than the left.  She admits that with walking she has burning type pain on the bottom of her foot.  She has noticed that she is having a stinging pain in her middle toe on the right foot.  She does wear supportive shoes.  She denies any swelling or weakness.  She does have a history of diabetes last A1c 6.8.  She is also complaining of throat pain.  She admits that it is worse in the  mornings.  She does have a history of snoring.  She is status post sleep study which was negative.  She denies any difficulty swallowing.  She does have a history of reflux and is on current treatment. Past Medical History:  Diagnosis Date  . Allergy   . Anemia   . Anxiety 01/2019  . Asthma   . Common migraine with intractable migraine 06/28/2017  . Diabetes ([place]) 02/2019  . GERD (gastroesophageal reflux disease)    pos H pylori  . Hypertension    controlled with medication  . IBS (irritable bowel syndrome) 06/26/2003  . Neonatal death    Vaginal delivery, full term-lived x2 hours.   . Palpitations   . Shortness of breath   . Spinal headache   . Valvular heart disease   . Vitamin D deficiency 02/2019    Past Surgical History:  Procedure Laterality Date  . ADENOIDECTOMY     and tonsils (as a child)  . boil  2001/06/25   right elbow  . CESAREAN SECTION     x4  . CESAREAN SECTION  06/02/2011   Procedure: CESAREAN SECTION;  Surgeon: [name], MD;  Location: [facility];  Service: Gynecology;  Laterality: N/A;  Primary Cesarean Section Delivery Baby Boy @ 0004, Apgars 9/9  . CESAREAN SECTION N/A 12/10/2013   Procedure: REPEAT CESAREAN SECTION;  Surgeon: [name], MD;  Location: [facility];  Service: Obstetrics;  Laterality: N/A;  . colonoscopy  11/23/2018  stated had issues with sleep when in for procedure  . OTHER SURGICAL HISTORY     Uterine surgery   . VAGINA SURGERY  2005   uterine surgery    Family History  Problem Relation Age of Onset  . Diabetes Mother   . Diabetes Father   . Diabetes Sister   . Anesthesia problems Neg Hx   . Colon cancer Neg Hx   . Esophageal cancer Neg Hx   . Rectal cancer Neg Hx   . Stomach cancer Neg Hx   . Colon polyps Neg Hx     Social History   Socioeconomic History  . Marital status: Married    Spouse name: [name]  . Number of children: 4  . Years of education: Not on file  . Highest education level: Not on file  Occupational  History  . Occupation: Unemployed  Tobacco Use  . Smoking status: Never Smoker  . Smokeless tobacco: Never Used  Substance and Sexual Activity  . Alcohol use: No  . Drug use: No  . Sexual activity: Yes    Birth control/protection: None, Implant    Comment: pregnant  Other Topics Concern  . Not on file  Social History Narrative   Lives with husband and child   Caffeine use: daily (tea), sometimes coffee/soda   Right handed    Social Determinants of Health   Financial Resource Strain:   . Difficulty of Paying Living Expenses: Not on file  Food Insecurity:   . Worried About Charity fundraiser in the Last Year: Not on file  . Ran Out of Food in the Last Year: Not on file  Transportation Needs:   . Lack of Transportation (Medical): Not on file  . Lack of Transportation (Non-Medical): Not on file  Physical Activity:   . Days of Exercise per Week: Not on file  . Minutes of Exercise per Session: Not on file  Stress:   . Feeling of Stress : Not on file  Social Connections:   . Frequency of Communication with Friends and Family: Not on file  . Frequency of Social Gatherings with Friends and Family: Not on file  . Attends Religious Services: Not on file  . Active Member of Clubs or Organizations: Not on file  . Attends Archivist Meetings: Not on file  . Marital Status: Not on file  Intimate Partner Violence:   . Fear of Current or Ex-Partner: Not on file  . Emotionally Abused: Not on file  . Physically Abused: Not on file  . Sexually Abused: Not on file    Outpatient Medications Prior to Visit  Medication Sig Dispense Refill  . acetaminophen (TYLENOL) 500 MG tablet Take 1 tablet (500 mg total) by mouth every 6 (six) hours as needed. 30 tablet 0  . albuterol (PROVENTIL HFA;VENTOLIN HFA) 108 (90 Base) MCG/ACT inhaler Inhale 2 puffs into the lungs every 6 (six) hours as needed for wheezing or shortness of breath. 1 Inhaler 2  . blood glucose meter kit and supplies  Dispense based on patient and insurance preference. Use up to four times daily as directed. (FOR ICD-10 E10.9, E11.9). 1 each 0  . Blood Pressure Monitoring DEVI 1 each by Does not apply route daily. 1 Device 0  . BREO ELLIPTA 100-25 MCG/INH AEPB INHALE 1 PUFF INTO THE LUNGS DAILY. 60 each 3  . dicyclomine (BENTYL) 10 MG capsule Take 10 mg by mouth.    [name] (AIMOVIG) 140 MG/ML SOAJ Inject 140 mg  into the skin every 30 (thirty) days. 1 pen 11  . etonogestrel (NEXPLANON) 68 MG IMPL implant 1 each by Subdermal route once.    . FEROSUL 325 (65 Fe) MG tablet TAKE 1 TABLET (325 MG TOTAL) BY MOUTH DAILY. 30 tablet 3  . fluticasone (FLONASE) 50 MCG/ACT nasal spray Place 2 sprays into both nostrils daily. 16 g 6  . gabapentin (NEURONTIN) 300 MG capsule Take 2 capsules in the evening 60 capsule 5  . glipiZIDE (GLUCOTROL) 10 MG tablet Take 1 tablet (10 mg total) by mouth 2 (two) times daily before a meal. 60 tablet 3  . glucose blood (FREESTYLE LITE) test strip Use as instructed 100 each 12  . hydrOXYzine (ATARAX/VISTARIL) 10 MG tablet Take 1 tablet (10 mg total) by mouth 3 (three) times daily as needed. 30 tablet 2  . ipratropium (ATROVENT) 0.06 % nasal spray Place 2 sprays into both nostrils as needed for rhinitis.    Marland Kitchen levocetirizine (XYZAL) 5 MG tablet TAKE 1 TABLET (5 MG TOTAL) BY MOUTH EVERY EVENING. 30 tablet 2  . metFORMIN (GLUCOPHAGE) 500 MG tablet Take 1 tablet (500 mg total) by mouth 2 (two) times daily with a meal. 180 tablet 3  . metoprolol succinate (TOPROL XL) 100 MG 24 hr tablet Take 1 tablet (100 mg total) by mouth daily. Take with or immediately following a meal. 90 tablet 3  . Multiple Vitamin (MULTI VITAMIN DAILY PO) Take by mouth.    . pantoprazole (PROTONIX) 40 MG tablet TAKE 1 TABLET BY MOUTH 2 TIMES DAILY BEFORE A MEAL. 180 tablet 1  . potassium chloride 20 MEQ/15ML (10%) SOLN Take 15 mLs (20 mEq total) by mouth daily. 473 mL 2  . rizatriptan (MAXALT) 10 MG tablet Take 1  tablet (10 mg total) by mouth 3 (three) times daily as needed for migraine. May repeat in 2 hours if needed 10 tablet 5  . rosuvastatin (CRESTOR) 5 MG tablet Take 1 tablet (5 mg total) by mouth at bedtime. 30 tablet 11  . sucralfate (CARAFATE) 1 GM/10ML suspension Take 10 mLs (1 g total) by mouth every 6 (six) hours as needed. 420 mL 2  . TRUEplus Lancets 28G MISC USE AS DIRECTED UP TO 4 TIMES DAILY 100 each 0  . Vitamin D, Ergocalciferol, (DRISDOL) 1.25 MG (50000 UT) CAPS capsule Take 1 capsule (50,000 Units total) by mouth every 7 (seven) days. 5 capsule 6  . meloxicam (MOBIC) 7.5 MG tablet Take 1 tablet (7.5 mg total) by mouth daily. (Patient not taking: Reported on 06/14/2019) 30 tablet 0  . methylcellulose (CITRUCEL) oral powder Take as directed, daily (Patient not taking: Reported on 06/14/2019)    . nortriptyline (PAMELOR) 25 MG capsule TAKE 1 CAPSULE (25 MG TOTAL) BY MOUTH AT BEDTIME. (Patient not taking: Reported on 06/14/2019) 30 capsule 1  . polyethylene glycol (MIRALAX) 17 g packet Take once to twice daily as needed (Patient not taking: Reported on 06/14/2019) 14 each 0  . ondansetron (ZOFRAN-ODT) 4 MG disintegrating tablet TAKE 1 TABLET BY MOUTH EVERY 8 HOURS AS NEEDED FOR NAUSEA OR VOMITING. (Patient not taking: Reported on 06/14/2019) 60 tablet 0   No facility-administered medications prior to visit.    Allergies  Allergen Reactions  . Topamax [Topiramate] Itching and Rash    ROS Review of Systems    Objective:    Physical Exam  BP 123/79   Pulse 97   Temp 98.5 F (36.9 C) (Oral)   Ht 4' 11"  (1.499 m)  Wt 164 lb (74.4 kg)   SpO2 100%   BMI 33.12 kg/m  Wt Readings from Last 3 Encounters:  06/14/19 164 lb (74.4 kg)  05/21/19 161 lb (73 kg)  04/26/19 164 lb 9.6 oz (74.7 kg)     Health Maintenance Due  Topic Date Due  . PNEUMOCOCCAL POLYSACCHARIDE VACCINE AGE 68-64 HIGH RISK  11/10/1979  . FOOT EXAM  11/10/1987  . OPHTHALMOLOGY EXAM  11/10/1987  . PAP SMEAR-Modifier   06/19/2016    There are no preventive care reminders to display for this patient.  Lab Results  Component Value Date   TSH 2.230 11/13/2018   Lab Results  Component Value Date   WBC 5.6 08/30/2018   HGB 12.0 08/30/2018   HCT 37.9 08/30/2018   MCV 84.8 08/30/2018   PLT 331 08/30/2018   Lab Results  Component Value Date   NA 140 05/21/2019   K 4.3 05/21/2019   CO2 21 05/21/2019   GLUCOSE 162 (H) 05/21/2019   BUN 11 05/21/2019   CREATININE 0.85 05/21/2019   BILITOT 0.4 05/21/2019   ALKPHOS 97 05/21/2019   AST 12 05/21/2019   ALT 14 05/21/2019   PROT 7.7 05/21/2019   ALBUMIN 4.7 05/21/2019   CALCIUM 9.8 05/21/2019   ANIONGAP 11 08/30/2018   GFR 119.42 08/28/2018   Lab Results  Component Value Date   CHOL 190 05/21/2019   Lab Results  Component Value Date   HDL 31 (L) 05/21/2019   Lab Results  Component Value Date   LDLCALC 134 (H) 05/21/2019   Lab Results  Component Value Date   TRIG 138 05/21/2019   Lab Results  Component Value Date   CHOLHDL 6.1 (H) 05/21/2019   Lab Results  Component Value Date   HGBA1C 6.8 (A) 06/14/2019      Assessment & Plan:   Problem List Items Addressed This Visit      High   Diabetes mellitus without complication ([place]) - Primary   Relevant Orders   POCT glycosylated hemoglobin (Hb A1C) (Completed)   POCT urinalysis dipstick (Completed)   POCT glucose (manual entry) (Completed)    Other Visit Diagnoses    Throat pain       Culture pending Humidified air   Relevant Orders   Throat culture   Culture, Group A Strep   RUQ abdominal pain       Ultrasound pending   Relevant Orders   US Abdomen Limited RUQ   CBC with Differential/Platelet   LLQ abdominal pain       X-ray 2 views pending   Relevant Orders   DG Abd 2 Views   CBC with Differential/Platelet   Numbness in feet       We will continue to monitor may add gabapentin after patient completes current evaluations      Meds ordered this encounter   Medications  . ondansetron (ZOFRAN-ODT) 8 MG disintegrating tablet    Sig: TAKE 1 TABLET BY MOUTH EVERY 8 HOURS AS NEEDED FOR NAUSEA OR VOMITING.    Dispense:  30 tablet    Refill:  0    Order Specific Question:   Supervising Provider    Answer:   Tresa Garter [id number]    Follow-up: No follow-ups on file.    Vevelyn Francois, NP

## 2019-06-14 NOTE — Patient Instructions (Signed)

## 2019-06-15 LAB — CBC WITH DIFFERENTIAL/PLATELET
Basophils Absolute: 0.1 10*3/uL (ref 0.0–0.2)
Basos: 1 %
EOS (ABSOLUTE): 0.1 10*3/uL (ref 0.0–0.4)
Eos: 1 %
Hematocrit: 34.3 % (ref 34.0–46.6)
Hemoglobin: 10.6 g/dL — ABNORMAL LOW (ref 11.1–15.9)
Immature Grans (Abs): 0 10*3/uL (ref 0.0–0.1)
Immature Granulocytes: 0 %
Lymphocytes Absolute: 3.3 10*3/uL — ABNORMAL HIGH (ref 0.7–3.1)
Lymphs: 35 %
MCH: 24.7 pg — ABNORMAL LOW (ref 26.6–33.0)
MCHC: 30.9 g/dL — ABNORMAL LOW (ref 31.5–35.7)
MCV: 80 fL (ref 79–97)
Monocytes Absolute: 0.4 10*3/uL (ref 0.1–0.9)
Monocytes: 4 %
Neutrophils Absolute: 5.6 10*3/uL (ref 1.4–7.0)
Neutrophils: 59 %
Platelets: 438 10*3/uL (ref 150–450)
RBC: 4.29 x10E6/uL (ref 3.77–5.28)
RDW: 15.7 % — ABNORMAL HIGH (ref 11.7–15.4)
WBC: 9.5 10*3/uL (ref 3.4–10.8)

## 2019-06-17 LAB — CULTURE, GROUP A STREP: Strep A Culture: NEGATIVE

## 2019-06-19 ENCOUNTER — Other Ambulatory Visit: Payer: Self-pay

## 2019-06-19 ENCOUNTER — Ambulatory Visit (HOSPITAL_COMMUNITY)
Admission: RE | Admit: 2019-06-19 | Discharge: 2019-06-19 | Disposition: A | Discharge status: Home / Self Care | Payer: Self-pay | Source: Ambulatory Visit | Attending: Nurse Practitioner | Admitting: Nurse Practitioner

## 2019-06-19 DIAGNOSIS — R1011 Right upper quadrant pain: Secondary | ICD-10-CM | POA: Insufficient documentation

## 2019-06-21 ENCOUNTER — Other Ambulatory Visit (HOSPITAL_COMMUNITY): Payer: Medicaid Other

## 2019-06-22 MED FILL — ROSUVASTATIN CALCIUM 5 MG T: 5 | 30 days supply | Qty: 30 | Fill #1

## 2019-06-27 MED FILL — GABAPENTIN 300 MG CAPSULE: 300 | 30 days supply | Qty: 60 | Fill #0

## 2019-06-27 MED FILL — PANTOPRAZOLE SOD DR 40 MG T: 40 | 30 days supply | Qty: 60 | Fill #5

## 2019-06-27 MED FILL — LEVOCETIRIZINE 5 MG TABLET: 5 | 30 days supply | Qty: 30 | Fill #2

## 2019-06-27 MED FILL — DICYCLOMINE 10 MG CAPSULE: 10 | 30 days supply | Qty: 90 | Fill #3

## 2019-06-27 MED FILL — NORTRIPTYLINE HCL 25 MG CAP: 25 | 30 days supply | Qty: 30 | Fill #1

## 2019-06-27 MED FILL — METOPROLOL SUCCINATE ER 100: 100 | 30 days supply | Qty: 30 | Fill #4

## 2019-06-27 MED FILL — ?RIZATRIPTAN 10 MG TABLET: 10 | 30 days supply | Qty: 9 | Fill #1

## 2019-07-11 ENCOUNTER — Other Ambulatory Visit: Payer: Self-pay | Admitting: Nurse Practitioner

## 2019-07-11 MED ORDER — NORGESTIMATE-ETH ESTRADIOL 0.25-35 MG-MCG PO TABS: 1.0000 | ORAL_TABLET | Freq: Every day | ORAL | 11 refills | Status: DC

## 2019-07-11 MED FILL — MONO-LINYAH 28 TABLET: 0.25-35 | 28 days supply | Qty: 28 | Fill #0

## 2019-07-17 ENCOUNTER — Encounter: Payer: Self-pay | Admitting: Neurology

## 2019-07-18 ENCOUNTER — Encounter: Payer: Self-pay | Admitting: Neurology

## 2019-07-27 ENCOUNTER — Ambulatory Visit (INDEPENDENT_AMBULATORY_CARE_PROVIDER_SITE_OTHER): Payer: Self-pay | Admitting: Gastroenterology

## 2019-07-27 ENCOUNTER — Other Ambulatory Visit: Payer: Self-pay | Admitting: Gastroenterology

## 2019-07-27 ENCOUNTER — Encounter: Payer: Self-pay | Admitting: Gastroenterology

## 2019-07-27 VITALS — BP 118/80 | HR 92 | Temp 97.9°F | Ht <= 58 in | Wt 163.8 lb

## 2019-07-27 DIAGNOSIS — R109 Unspecified abdominal pain: Secondary | ICD-10-CM

## 2019-07-27 DIAGNOSIS — K5900 Constipation, unspecified: Secondary | ICD-10-CM

## 2019-07-27 DIAGNOSIS — Z8619 Personal history of other infectious and parasitic diseases: Secondary | ICD-10-CM

## 2019-07-27 DIAGNOSIS — R110 Nausea: Secondary | ICD-10-CM

## 2019-07-27 MED ORDER — LINACLOTIDE 72 MCG PO CAPS: 72.0000 ug | ORAL_CAPSULE | Freq: Every day | ORAL | 2 refills | Status: DC

## 2019-07-27 MED ORDER — DICYCLOMINE HCL 10 MG PO CAPS: 10.0000 mg | ORAL_CAPSULE | Freq: Three times a day (TID) | ORAL | 2 refills | Status: DC

## 2019-07-27 MED FILL — !LINZESS 72 MCG CAPSULE: 72 MCG | 30 days supply | Qty: 30 | Fill #0

## 2019-07-27 MED FILL — DICYCLOMINE 10 MG CAPSULE: 10 | 30 days supply | Qty: 90 | Fill #0

## 2019-07-27 NOTE — Patient Instructions (Addendum)
If you are age 42 or older, your body mass index should be between 23-30. Your Body mass index is 34.23 kg/m. If this is out of the aforementioned range listed, please consider follow up with your Primary Care Provider.  If you are age 87 or younger, your body mass index should be between 19-25. Your Body mass index is 34.23 kg/m. If this is out of the aformentioned range listed, please consider follow up with your Primary Care Provider.   We have sent the following medications to your pharmacy for you to pick up at your convenience: Linzess 72 mcg daily. Dicyclomine 10 mg three times daily before meals.   Follow up with Dr. Havery Moros on Friday 08/31/19 @ 1:40 pm.

## 2019-07-27 NOTE — Progress Notes (Signed)
07/27/2019 Shanina Kepple [id number] 02-Sep-1975   HISTORY OF PRESENT ILLNESS: This is a 44 year old female is a patient of Dr. Doyne Keel.  She speaks Arabic so translator/interpreter was used for the entirety of the visit.  She has been followed by Dr. Havery Moros and evaluated extensively for complaints of GERD pain, constipation.  She previously had H. pylori IgG serology in April 2019 was treated for that.  She had an EGD in September 2019 with some gastritis noted, but unfortunately her procedure was aborted due to significant laryngospasm and oxygen desaturation.  Esophagus was not well evaluated but a limited exam did not appear to have any significant abnormalities.  Since the procedure was aborted biopsies were not taken to confirm H. pylori eradication, but her PPI was stopped and she submitted a H. pylori stool antigen study which was negative.  She has had ultrasound, CT scan, labs, and colonoscopy for evaluation of her symptoms as follows:  Prior workup: CT abdomen pelvis 03/16/18 - normal [place] 01/15/14 - gallbladder normal except for possible hepatic steatosis [place] 06/19/2019- RUQ normal EGD 01/04/18 - significant desaturation due to laryngospasm and procedure was aborted, mild gastriits, esophagus not well evaluated   Colonoscopy 11/23/18 - The perianal and digital rectal examinations were normal. - The terminal ileum appeared normal. - The left colon was extremely tortuous. - A 3 mm polyp was found in the transverse colon. The polyp was sessile. The polyp was removed with a cold snare. Resection and retrieval were complete. - Internal hemorrhoids were found during retroflexion. - The exam was otherwise without abnormality. No obvious inflammatory changes. Of note, given the breathing pattern observed under anesthesia for this case, high suspicion for sleep Apnea. Path shows serrated polyp - repeat colonoscopy in 5 years  Celiac labs negative.  She is here today with ongoing  symptoms of generalized abdominal pain that she describes as being crampy in nature, constant but feels a bit better after eating.  Also complains of abdominal bloating and constipation, which she reports has been present for a long time.  She denies any rectal bleeding.  Has nausea and vomiting at times.  Current GI medication regimen is dicyclomine 10 mg daily, pantoprazole 40 mg twice daily, and Carafate suspension as needed.  Nortriptyline 25 mg at bedtime was added in September 2020 by Dr. Havery Moros.  MiraLAX is on her medication list, but she says she has not been using that.  Past Medical History:  Diagnosis Date  . Allergy   . Anemia   . Anxiety 01/2019  . Asthma   . Common migraine with intractable migraine 06/28/2017  . Diabetes ([place]) 02/2019  . GERD (gastroesophageal reflux disease)    pos H pylori  . Hypertension    controlled with medication  . IBS (irritable bowel syndrome) 06-29-03  . Neonatal death    Vaginal delivery, full term-lived x2 hours.   . Palpitations   . Shortness of breath   . Spinal headache   . Valvular heart disease   . Vitamin D deficiency 02/2019   Past Surgical History:  Procedure Laterality Date  . ADENOIDECTOMY     and tonsils (as a child)  . boil  06-28-2001   right elbow  . CESAREAN SECTION     x4  . CESAREAN SECTION  06/02/2011   Procedure: CESAREAN SECTION;  Surgeon: [name], MD;  Location: [facility];  Service: Gynecology;  Laterality: N/A;  Primary Cesarean Section Delivery Baby Boy @ 0004, Apgars 9/9  .  CESAREAN SECTION N/A 12/10/2013   Procedure: REPEAT CESAREAN SECTION;  Surgeon: [name], MD;  Location: [facility];  Service: Obstetrics;  Laterality: N/A;  . colonoscopy  11/23/2018   stated had issues with sleep when in for procedure  . OTHER SURGICAL HISTORY  2005   Uterine surgery     reports that she has never smoked. She has never used smokeless tobacco. She reports that she does not drink alcohol or use drugs. family history  includes Diabetes in her father, mother, and sister. Allergies  Allergen Reactions  . Topamax [Topiramate] Itching and Rash      Outpatient Encounter Medications as of 07/27/2019  Medication Sig  . acetaminophen (TYLENOL) 500 MG tablet Take 500 mg by mouth every 6 (six) hours as needed.  Marland Kitchen albuterol (PROVENTIL HFA;VENTOLIN HFA) 108 (90 Base) MCG/ACT inhaler Inhale 2 puffs into the lungs every 6 (six) hours as needed for wheezing or shortness of breath.  . blood glucose meter kit and supplies Dispense based on patient and insurance preference. Use up to four times daily as directed. (FOR ICD-10 E10.9, E11.9).  Marland Kitchen Blood Pressure Monitoring DEVI 1 each by Does not apply route daily.  Marland Kitchen BREO ELLIPTA 100-25 MCG/INH AEPB INHALE 1 PUFF INTO THE LUNGS DAILY.  Marland Kitchen dicyclomine (BENTYL) 10 MG capsule Take 10 mg by mouth.  [name] (AIMOVIG) 140 MG/ML SOAJ Inject 140 mg into the skin every 30 (thirty) days.  Marland Kitchen etonogestrel (NEXPLANON) 68 MG IMPL implant 1 each by Subdermal route once.  . FEROSUL 325 (65 Fe) MG tablet TAKE 1 TABLET (325 MG TOTAL) BY MOUTH DAILY.  . fluticasone (FLONASE) 50 MCG/ACT nasal spray Place 2 sprays into both nostrils daily.  Marland Kitchen gabapentin (NEURONTIN) 300 MG capsule Take 2 capsules in the evening  . glipiZIDE (GLUCOTROL) 10 MG tablet Take 1 tablet (10 mg total) by mouth 2 (two) times daily before a meal.  . glucose blood (FREESTYLE LITE) test strip Use as instructed  . hydrOXYzine (ATARAX/VISTARIL) 10 MG tablet Take 1 tablet (10 mg total) by mouth 3 (three) times daily as needed.  Marland Kitchen ipratropium (ATROVENT) 0.06 % nasal spray Place 2 sprays into both nostrils as needed for rhinitis.  Marland Kitchen levocetirizine (XYZAL) 5 MG tablet TAKE 1 TABLET (5 MG TOTAL) BY MOUTH EVERY EVENING.  . meloxicam (MOBIC) 7.5 MG tablet Take 1 tablet (7.5 mg total) by mouth daily.  . metFORMIN (GLUCOPHAGE) 500 MG tablet Take 1 tablet (500 mg total) by mouth 2 (two) times daily with a meal.  . methylcellulose  (CITRUCEL) oral powder Take as directed, daily  . metoprolol succinate (TOPROL XL) 100 MG 24 hr tablet Take 1 tablet (100 mg total) by mouth daily. Take with or immediately following a meal.  . Multiple Vitamin (MULTI VITAMIN DAILY PO) Take by mouth.  . norgestimate-ethinyl estradiol (SPRINTEC 28) 0.25-35 MG-MCG tablet Take 1 tablet by mouth daily.  . nortriptyline (PAMELOR) 25 MG capsule TAKE 1 CAPSULE (25 MG TOTAL) BY MOUTH AT BEDTIME.  Marland Kitchen ondansetron (ZOFRAN-ODT) 8 MG disintegrating tablet TAKE 1 TABLET BY MOUTH EVERY 8 HOURS AS NEEDED FOR NAUSEA OR VOMITING.  . pantoprazole (PROTONIX) 40 MG tablet TAKE 1 TABLET BY MOUTH 2 TIMES DAILY BEFORE A MEAL.  Marland Kitchen polyethylene glycol (MIRALAX) 17 g packet Take once to twice daily as needed  . potassium chloride 20 MEQ/15ML (10%) SOLN Take 15 mLs (20 mEq total) by mouth daily.  . rizatriptan (MAXALT) 10 MG tablet Take 1 tablet (10 mg total) by mouth  3 (three) times daily as needed for migraine. May repeat in 2 hours if needed  . rosuvastatin (CRESTOR) 5 MG tablet Take 1 tablet (5 mg total) by mouth at bedtime.  . sucralfate (CARAFATE) 1 GM/10ML suspension Take 10 mLs (1 g total) by mouth every 6 (six) hours as needed.  . TRUEplus Lancets 28G MISC USE AS DIRECTED UP TO 4 TIMES DAILY  . Vitamin D, Ergocalciferol, (DRISDOL) 1.25 MG (50000 UT) CAPS capsule Take 1 capsule (50,000 Units total) by mouth every 7 (seven) days.  . [DISCONTINUED] norgestimate-ethinyl estradiol (MONO-LINYAH) 0.25-35 MG-MCG tablet Take 1 tablet by mouth daily.  . [DISCONTINUED] acetaminophen (TYLENOL) 500 MG tablet Take 1 tablet (500 mg total) by mouth every 6 (six) hours as needed.  . [DISCONTINUED] etonogestrel (NEXPLANON) 68 MG IMPL implant 1 each by Subdermal route once.   No facility-administered encounter medications on file as of 07/27/2019.     REVIEW OF SYSTEMS  : All other systems reviewed and negative except where noted in the History of Present Illness.   PHYSICAL  EXAM: Temp 97.9 F (36.6 C)   Ht 4' 10"  (1.473 m)   Wt 163 lb 12.8 oz (74.3 kg)   BMI 34.23 kg/m  General: Well developed female in no acute distress Head: Normocephalic and atraumatic Eyes:  Sclerae anicteric, conjunctiva pink. Ears: Normal auditory acuity Lungs: Clear throughout to auscultation; no increased WOB Heart: Regular rate and rhythm; no M/R/G Abdomen: Soft, non-distended.  BS present.  Diffuse TTP. Musculoskeletal: Symmetrical with no gross deformities  Skin: No lesions on visible extremities Extremities: No edema  Neurological: Alert oriented x 4, grossly non-focal Psychological:  Alert and cooperative. Normal mood and affect  ASSESSMENT AND PLAN: *44 year old female with ongoing complaints of abdominal pain, constipation, nausea, and bloating.  Symptoms thought to be functional after extensive evaluation including EGD, colonoscopy, ultrasound, CT scan, serologic work-up.  Did have H. pylori infection that was treated and eradicated without any relief of her symptoms.  Currently on dicyclomine 10 mg daily, pantoprazole 40 mg twice daily, and Carafate suspension as needed for her GI symptoms.  Also nortriptyline 25 mg at bedtime was added in September 2020 with really no change.  I am going to start low-dose Linzess 72 mcg daily and I am going to increase her dicyclomine to 10 mg 3 times daily.  Prescriptions for those are sent to her pharmacy.  She will follow-up with Dr. Havery Moros in 4 to 6 weeks.  ? If HIDA scan would be of any benefit.   CC:  Vevelyn Francois, NP

## 2019-07-30 ENCOUNTER — Other Ambulatory Visit: Payer: Self-pay | Admitting: Nurse Practitioner

## 2019-07-30 ENCOUNTER — Other Ambulatory Visit: Payer: Self-pay | Admitting: Family Medicine

## 2019-07-30 ENCOUNTER — Telehealth: Payer: Self-pay | Admitting: Neurology

## 2019-07-30 ENCOUNTER — Other Ambulatory Visit: Payer: Self-pay | Admitting: Gastroenterology

## 2019-07-30 DIAGNOSIS — Z9109 Other allergy status, other than to drugs and biological substances: Secondary | ICD-10-CM

## 2019-07-30 DIAGNOSIS — E1165 Type 2 diabetes mellitus with hyperglycemia: Secondary | ICD-10-CM

## 2019-07-30 MED ORDER — AIMOVIG 140 MG/ML ~~LOC~~ SOAJ: 140.0000 mg | SUBCUTANEOUS | 11 refills | Status: DC

## 2019-07-30 MED FILL — ?RIZATRIPTAN 10 MG TABLET: 10 | 30 days supply | Qty: 9 | Fill #2

## 2019-07-30 MED FILL — ROSUVASTATIN CALCIUM 5 MG T: 5 | 30 days supply | Qty: 30 | Fill #2

## 2019-07-30 MED FILL — ?GLIPIZIDE 10 MG TABLET: 10 | 30 days supply | Qty: 60 | Fill #0

## 2019-07-30 MED FILL — LEVOCETIRIZINE 5 MG TABLET: 5 | 30 days supply | Qty: 30 | Fill #0

## 2019-07-30 MED FILL — METFORMIN HCL 500 MG TABS: 500 | 30 days supply | Qty: 60 | Fill #4

## 2019-07-30 MED FILL — ?ERGOCALCIFEROL 50000 UNITC: 1.25 MG | 28 days supply | Qty: 4 | Fill #2

## 2019-07-30 MED FILL — METOPROLOL SUCCINATE ER 100: 100 | 30 days supply | Qty: 30 | Fill #5

## 2019-07-30 NOTE — Telephone Encounter (Signed)
Fax confirmation received [phone].

## 2019-07-30 NOTE — Telephone Encounter (Signed)
Done as requested.

## 2019-07-30 NOTE — Telephone Encounter (Signed)
Betty Cain from [facility] [employer] [facility] [employer] called stating that the pt is needing a refill on her Erenumab-aooe (AIMOVIG) 140 MG/ML SOAJ and it needs to be faxed to [phone]

## 2019-07-31 ENCOUNTER — Other Ambulatory Visit: Payer: Self-pay

## 2019-07-31 ENCOUNTER — Other Ambulatory Visit: Payer: Self-pay | Admitting: Gastroenterology

## 2019-07-31 ENCOUNTER — Telehealth: Payer: Self-pay

## 2019-07-31 MED ORDER — HYDROCORTISONE (PERIANAL) 2.5 % EX CREA: 1.0000 "application " | TOPICAL_CREAM | Freq: Two times a day (BID) | CUTANEOUS | 1 refills | Status: DC

## 2019-07-31 MED FILL — ?PANTOPRAZOLE SO DR 40MG TA: 40 | 30 days supply | Qty: 60 | Fill #0

## 2019-07-31 MED FILL — PROCTOZONE-HC 2.5 % CREA: 2.5 | 7 days supply | Qty: 30 | Fill #0

## 2019-07-31 MED FILL — NORTRIPTYLINE HCL 25 MG CAP: 25 | 30 days supply | Qty: 30 | Fill #0

## 2019-07-31 NOTE — Telephone Encounter (Signed)
See 4/20 My Chart message. I have sent a prescription for hydrocortisone 2.5% and advised the pt that there are no closer dieticians available.  The pt has been advised of the information and verbalized understanding.

## 2019-08-07 ENCOUNTER — Other Ambulatory Visit: Payer: Self-pay

## 2019-08-07 ENCOUNTER — Ambulatory Visit: Payer: Self-pay | Attending: Neurology

## 2019-08-07 ENCOUNTER — Ambulatory Visit: Payer: Medicaid Other

## 2019-08-07 DIAGNOSIS — G8929 Other chronic pain: Secondary | ICD-10-CM | POA: Insufficient documentation

## 2019-08-07 DIAGNOSIS — M545 Low back pain, unspecified: Secondary | ICD-10-CM

## 2019-08-07 DIAGNOSIS — M79672 Pain in left foot: Secondary | ICD-10-CM | POA: Insufficient documentation

## 2019-08-07 DIAGNOSIS — M79671 Pain in right foot: Secondary | ICD-10-CM

## 2019-08-07 DIAGNOSIS — M5432 Sciatica, left side: Secondary | ICD-10-CM

## 2019-08-07 DIAGNOSIS — M5431 Sciatica, right side: Secondary | ICD-10-CM

## 2019-08-07 MED FILL — GABAPENTIN 300 MG CAPSULE: 300 | 30 days supply | Qty: 60 | Fill #1

## 2019-08-07 NOTE — Therapy (Signed)
[facility] [address] [place], [place], [address] Phone: [phone]   Fax:  [phone]  Physical Therapy Evaluation  Patient Details  Name: Betty Cain MRN: [id number] Date of Birth: Nov 16, 1976 Referring Provider (PT): Butler Denmark, NP   Encounter Date: 08/06/2020  PT End of Session - 08/07/19 1434    Visit Number  1    Number of Visits  11    Date for PT Re-Evaluation  09/11/19    PT Start Time  1100    PT Stop Time  1145    PT Time Calculation (min)  45 min    Activity Tolerance  Patient tolerated treatment well    Behavior During Therapy  [facility] for tasks assessed/performed       Past Medical History:  Diagnosis Date  . Allergy   . Anemia   . Anxiety 01/2019  . Asthma   . Common migraine with intractable migraine 06/28/2017  . Diabetes ([place]) 02/2019  . GERD (gastroesophageal reflux disease)    pos H pylori  . Hypertension    controlled with medication  . IBS (irritable bowel syndrome) 2003-07-26  . Neonatal death    Vaginal delivery, full term-lived x2 hours.   . Palpitations   . Shortness of breath   . Spinal headache   . Valvular heart disease   . Vitamin D deficiency 02/2019    Past Surgical History:  Procedure Laterality Date  . ADENOIDECTOMY     and tonsils (as a child)  . boil  2001/07/25   right elbow  . CESAREAN SECTION     x4  . CESAREAN SECTION  06/02/2011   Procedure: CESAREAN SECTION;  Surgeon: [name], MD;  Location: [facility];  Service: Gynecology;  Laterality: N/A;  Primary Cesarean Section Delivery Baby Boy @ 0004, Apgars 9/9  . CESAREAN SECTION N/A 12/10/2013   Procedure: REPEAT CESAREAN SECTION;  Surgeon: [name], MD;  Location: [facility];  Service: Obstetrics;  Laterality: N/A;  . colonoscopy  11/23/2018   stated had issues with sleep when in for procedure  . OTHER SURGICAL HISTORY  07-26-2003   Uterine surgery     There were no vitals filed for this visit.   Subjective  Assessment - 08/07/19 1408    Subjective  Daily headaches. Migraines about 10 a month. She has had 3 aimovig injections so far. Headaches are less intense and go away in 2 hours. Had sleep study done which was normal. Reports of no falls. Reports of no bowel or bladder issues. For past week, she reports of intermittent issues with bladder where she can't completely empty her bladder. Pt reports her back pain started about 10 months ago. She reports that pain travels through back of her leg to heel. Pain is worst in R>L leg. She reports that heel pain is worst with first few steps and then it gets little better.    Patient is accompained by:  Interpreter    Pertinent History  DM, vertigo, mirgraines    Limitations  Lifting;Standing;Walking;House hold activities    How long can you sit comfortably?  no issues    How long can you stand comfortably?  15-20 min    How long can you walk comfortably?  15-20 min    Diagnostic tests  04/28/18: Standing frontal, standing lateral, and standing spot lumbosacrallateral images were obtained. The there are 5 non-rib-bearing lumbartype vertebral bodies. There is slight lumbar dextroscoliosis. Nofracture or spondylolisthesis. The disc spaces appear  normal. Noerosive change.    Patient Stated Goals  Be able to move better    Currently in Pain?  Yes    Pain Score  8     Pain Location  Back    Pain Orientation  Right;Mid;Lower    Pain Descriptors / Indicators  Tightness;Tingling;Burning;Aching    Pain Type  Chronic pain    Pain Radiating Towards  posterior leg and heel    Pain Onset  More than a month ago    Pain Frequency  Constant    Aggravating Factors   bending, lifting, twisting, standing, walking    Pain Relieving Factors  lying on side, sitting    Effect of Pain on Daily Activities  difficulty with daily activities         [facility] PT Assessment - 08/07/19 1128      Assessment   Medical Diagnosis  LBP with sciatica    Referring Provider (PT)  Butler Denmark, NP    Onset Date/Surgical Date  04/26/19    Prior Therapy  none      Precautions   Precautions  None      Restrictions   Weight Bearing Restrictions  No      Home Environment   Living Environment  Private residence    Available Help at Discharge  Family   husband, 4 kids   Type of [place] to enter    Entrance Stairs-Number of Steps  2    Entrance Stairs-Rails  None    Home Layout  One level    Home Equipment  None      Prior Function   Level of Independence  Independent    Vocation  Unemployed      Cognition   Overall Cognitive Status  Within Functional Limits for tasks assessed    Attention  Focused;Sustained    Focused Attention  Appears intact      Sensation   Light Touch  Impaired Detail    Light Touch Impaired Details  Impaired RLE   L5-S1 dermatomes     ROM / Strength   AROM / PROM / Strength  Strength;AROM      AROM   AROM Assessment Site  Lumbar    Lumbar Flexion  100% pain    Lumbar Extension  50% pain    Lumbar - Right Side Bend  80% pain    Lumbar - Left Side Bend  100%    Lumbar - Right Rotation  70% pain    Lumbar - Left Rotation  100%      Strength   Strength Assessment Site  Hip;Knee;Ankle    Right/Left Hip  Right;Left    Right Hip Flexion  4/5    Right Hip ABduction  4/5    Right Hip ADduction  4/5    Left Hip Flexion  5/5    Left Hip ABduction  4+/5    Left Hip ADduction  4+/5    Right/Left Knee  Right;Left    Right Knee Flexion  5/5    Right Knee Extension  5/5    Left Knee Flexion  5/5    Left Knee Extension  5/5    Right/Left Ankle  Right;Left    Right Ankle Dorsiflexion  5/5    Left Ankle Dorsiflexion  5/5                Objective measurements completed on examination: See above findings.  Treatment: Supine knee to chest: 5 x 5" holds R and L Supine lower trunk rotations: 10x R and L Pt advised to relax and breath during exercises, pt tends to tense up while stretching. Patient  educated on staying in pain free range.          PT Education - 08/07/19 1415    Education Details  Patient educated on importance of modifying ADLs to allow her body to rest. Pt educated on spending 30 min everyday to allow her to performe HEP and to relax without worrying about daily chores. Patient educated on doing things that bring enjoyment to her instead of constantly worrying about things around her.    Person(s) Educated  Patient    Methods  Explanation    Comprehension  Verbalized understanding       PT Short Term Goals - 08/07/19 1416      PT SHORT TERM GOAL #1   Title  Patient will report no pain in her heel with sit to stand or walking to improve her ability to stand and walk at home    Baseline  Constant pain in bil heels    Time  3    Period  Weeks    Status  New    Target Date  08/28/19      PT SHORT TERM GOAL #2   Title  Patient will demo 100% lumbar ROM with <5/10 pain to improve ability to bend and twist with cleaning, cooking, ADLs.    Baseline  ROM limited, pain 8/10    Time  3    Period  Weeks    Status  New    Target Date  08/28/19        PT Long Term Goals - 08/07/19 1427      PT LONG TERM GOAL #1   Title  Pt will report no greater than 5/10 pain with her ADLs to improve her function at home.    Baseline  >8/10    Time  5    Period  Weeks    Status  New    Target Date  09/11/19      PT LONG TERM GOAL #2   Title  Pt will demo compliance with HEP and activity modification with ADLs to improve self management of her symptoms.    Time  5    Period  Weeks    Status  New    Target Date  09/11/19      PT LONG TERM GOAL #3   Title  Patient will demo 5/5 strength with bil LE to improve functional strength with transfers.    Baseline  4/5 in hips grossly    Time  5    Period  Weeks    Status  New    Target Date  09/11/19             Plan - 08/07/19 1149    Clinical Impression Statement  Patient is a 44 y.o. female who was seen  today for physical therapy evaluation and treatment for chronic LBP and bil sciatica. Pt also has hypofunctioning of vestibular system which causes her dizziness with bed mobility, transfers and affects her standing balance. Patient demonstrates decreased strength of bil hips, decreased lumbar AROM, guarded movements, and pain. These impairments are limiting patient from prolonged standing, walking, bending, twisting and lifting which is interfering with her ADLs, self care, transfers and daily functioning. patient will benefit from skilled PT to address these impairments  and improve overall function.    Examination-Activity Limitations  Caring for Others;Carry;Lift;Squat;Stand;Transfers    Examination-Participation Restrictions  Cleaning;Community Activity;Laundry;Meal Prep;Shop;Yard Work    Stability/Clinical Decision Making  Evolving/Moderate complexity    Clinical Decision Making  Low    Rehab Potential  Good    PT Frequency  2x / week    PT Duration  6 weeks    PT Treatment/Interventions  ADLs/Self Care Home Management;Therapeutic activities;Therapeutic exercise;Balance training;Neuromuscular re-education;Patient/family education;Manual techniques;Passive range of motion;Energy conservation;Joint Manipulations;Vestibular    PT Next Visit Plan  Review HEP and progress    PT Home Exercise Plan  Access Code: [email or web address]: [email or web address]: 04/27/2021Prepared by: Gwenyth Bouillon PatelExercisesHooklying Single Knee to Chest - 2 x daily - 7 x weekly - 3 sets - 5 repsSupine Lower Trunk Rotation - 2 x daily - 7 x weekly - 3 sets - 5 reps    Consulted and Agree with Plan of Care  Patient       Patient will benefit from skilled therapeutic intervention in order to improve the following deficits and impairments:  Decreased strength, Hypomobility, Difficulty walking, Increased muscle spasms, Impaired flexibility, Impaired sensation, Pain  Visit Diagnosis: Pain of both heels  Sciatica, left  side  Sciatica, right side  Chronic low back pain, unspecified back pain laterality, unspecified whether sciatica present     Problem List Patient Active Problem List   Diagnosis Date Noted  . Proteinuria 06/14/2019  . Elevated sed rate 05/23/2019  . Elevated C-reactive protein (CRP) 05/23/2019  . Diabetes mellitus without complication ([place]) [id number]  . Dyslipidemia (high LDL; low HDL) 05/23/2019  . Type 2 diabetes mellitus with hyperglycemia, without long-term current use of insulin ([place]) 02/17/2019  . Hemoglobin A1C between 7% and 9% indicating borderline diabetic control 02/17/2019  . Breast cancer screening by mammogram 02/17/2019  . Frequent headaches 02/14/2019  . Anxiety 02/14/2019  . Common migraine with intractable migraine 06/28/2017  . Chest pain at rest 10/17/2016  . Sinus tachycardia 08/18/2016  . Prolonged Q-T interval on ECG 08/18/2016  . Vertigo 07/22/2015  . Abdominal discomfort 02/14/2015  . Low back pain 02/14/2015  . Generalized abdominal pain 02/01/2015  . Nausea 02/01/2015  . Environmental allergies 02/01/2015  . Language barrier to communication 02/01/2015  . Anemia 01/23/2015  . Constipation 01/21/2015  . Knee pain, bilateral 01/21/2015  . Palpitations 09/18/2013  . Shortness of breath 09/18/2013  . Advanced maternal age in pregnancy in second trimester 06/19/2013  . IBS (irritable bowel syndrome) 04/13/2003    Kerrie Pleasure 08/07/2019, 2:36 PM  [facility] [address] [place], [place], [address] Phone: [phone]   Fax:  [phone]  Name: Aponi Groomes MRN: [id number] Date of Birth: 04/01/1976

## 2019-08-08 ENCOUNTER — Encounter: Payer: Self-pay | Admitting: Gastroenterology

## 2019-08-08 DIAGNOSIS — Z8619 Personal history of other infectious and parasitic diseases: Secondary | ICD-10-CM | POA: Insufficient documentation

## 2019-08-08 MED FILL — MONO-LINYAH 28 TABLET: 0.25-35 | 28 days supply | Qty: 28 | Fill #1

## 2019-08-08 NOTE — Progress Notes (Signed)
Agree with assessment as outlined. 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 Her pain does not seem biliary colic but can consider HIDA pending her course. Agree with [place] and wonder if we should titrate up nortriptyline if she tolerates it. Thanks

## 2019-08-14 ENCOUNTER — Ambulatory Visit: Payer: Self-pay

## 2019-08-17 ENCOUNTER — Other Ambulatory Visit: Payer: Self-pay

## 2019-08-17 ENCOUNTER — Ambulatory Visit: Payer: Self-pay | Attending: Neurology

## 2019-08-17 DIAGNOSIS — M545 Low back pain, unspecified: Secondary | ICD-10-CM

## 2019-08-17 DIAGNOSIS — M5431 Sciatica, right side: Secondary | ICD-10-CM

## 2019-08-17 DIAGNOSIS — M5432 Sciatica, left side: Secondary | ICD-10-CM

## 2019-08-17 DIAGNOSIS — M79672 Pain in left foot: Secondary | ICD-10-CM | POA: Insufficient documentation

## 2019-08-17 DIAGNOSIS — M79671 Pain in right foot: Secondary | ICD-10-CM

## 2019-08-17 DIAGNOSIS — G8929 Other chronic pain: Secondary | ICD-10-CM | POA: Insufficient documentation

## 2019-08-17 DIAGNOSIS — M6281 Muscle weakness (generalized): Secondary | ICD-10-CM | POA: Insufficient documentation

## 2019-08-17 NOTE — Therapy (Signed)
[facility] [address] [place], [place], [address] Phone: [phone]   Fax:  [phone]  Physical Therapy Treatment  Patient Details  Name: Betty Cain MRN: [id number] Date of Birth: June 21, 1975 Referring Provider (PT): Butler Denmark, NP   Encounter Date: 08/17/2019    Past Medical History:  Diagnosis Date  . Allergy   . Anemia   . Anxiety 01/2019  . Asthma   . Common migraine with intractable migraine 06/28/2017  . Diabetes ([place]) 02/2019  . GERD (gastroesophageal reflux disease)    pos H pylori  . Hypertension    controlled with medication  . IBS (irritable bowel syndrome) 07/21/03  . Neonatal death    Vaginal delivery, full term-lived x2 hours.   . Palpitations   . Shortness of breath   . Spinal headache   . Valvular heart disease   . Vitamin D deficiency 02/2019    Past Surgical History:  Procedure Laterality Date  . ADENOIDECTOMY     and tonsils (as a child)  . boil  Jul 20, 2001   right elbow  . CESAREAN SECTION     x4  . CESAREAN SECTION  06/02/2011   Procedure: CESAREAN SECTION;  Surgeon: [name], MD;  Location: [facility];  Service: Gynecology;  Laterality: N/A;  Primary Cesarean Section Delivery Baby Boy @ 0004, Apgars 9/9  . CESAREAN SECTION N/A 12/10/2013   Procedure: REPEAT CESAREAN SECTION;  Surgeon: [name], MD;  Location: [facility];  Service: Obstetrics;  Laterality: N/A;  . colonoscopy  11/23/2018   stated had issues with sleep when in for procedure  . OTHER SURGICAL HISTORY  07-21-03   Uterine surgery     There were no vitals filed for this visit.  Pt asked permission with using interpreter to discuss the POC regarding massage.  Treatment:  Manual therapy: STM to bil lumbar paraspainalis, bil gluts  Treatment: Supine knee to chest: 5 x 5" holds R and L Supine lower trunk rotations: 10x R and L Supine piriformis stretch: 3 x 30"  Rand L.   Current HEP: Access Code: [email or web address] URL:  [email or web address] Date: 08/07/2019 Prepared by: Markus Jarvis  Exercises Hooklying Single Knee to Chest - 2 x daily - 7 x weekly - 1 sets - 10 reps - 10 hold Supine Lower Trunk Rotation - 2 x daily - 7 x weekly - 1 sets - 10 reps Supine Piriformis Stretch with Foot on Ground - 2 x daily - 7 x weekly - 3 reps - 30 hold                           PT Short Term Goals - 08/17/19 [id number]      PT SHORT TERM GOAL #1   Title  Patient will report no pain in her heel with sit to stand or walking to improve her ability to stand and walk at home    Baseline  Constant pain in bil heels    Time  3    Period  Weeks    Status  New    Target Date  08/28/19      PT SHORT TERM GOAL #2   Title  Patient will demo 100% lumbar ROM with <5/10 pain to improve ability to bend and twist with cleaning, cooking, ADLs.    Baseline  ROM limited, pain 8/10    Time  3    Period  Weeks    Status  New    Target Date  08/28/19        PT Long Term Goals - 08/17/19 [id number]      PT LONG TERM GOAL #1   Title  Pt will report no greater than 5/10 pain with her ADLs to improve her function at home.    Baseline  >8/10    Time  5    Period  Weeks    Status  New      PT LONG TERM GOAL #2   Title  Pt will demo compliance with HEP and activity modification with ADLs to improve self management of her symptoms.    Time  5    Period  Weeks    Status  New      PT LONG TERM GOAL #3   Title  Patient will demo 5/5 strength with bil LE to improve functional strength with transfers.    Baseline  4/5 in hips grossly    Time  5    Period  Weeks    Status  New              Patient will benefit from skilled therapeutic intervention in order to improve the following deficits and impairments:     Visit Diagnosis: Pain of both heels  Sciatica, left side  Sciatica, right side  Chronic low back pain, unspecified back pain laterality, unspecified whether sciatica  present     Problem List Patient Active Problem List   Diagnosis Date Noted  . History of Helicobacter pylori infection 08/08/2019  . Proteinuria 06/14/2019  . Elevated sed rate 05/23/2019  . Elevated C-reactive protein (CRP) 05/23/2019  . Diabetes mellitus without complication ([place]) [id number]  . Dyslipidemia (high LDL; low HDL) 05/23/2019  . Type 2 diabetes mellitus with hyperglycemia, without long-term current use of insulin ([place]) 02/17/2019  . Hemoglobin A1C between 7% and 9% indicating borderline diabetic control 02/17/2019  . Breast cancer screening by mammogram 02/17/2019  . Frequent headaches 02/14/2019  . Anxiety 02/14/2019  . Common migraine with intractable migraine 06/28/2017  . Chest pain at rest 10/17/2016  . Sinus tachycardia 08/18/2016  . Prolonged Q-T interval on ECG 08/18/2016  . Vertigo 07/22/2015  . Abdominal discomfort 02/14/2015  . Low back pain 02/14/2015  . Abdominal pain 02/01/2015  . Nausea 02/01/2015  . Environmental allergies 02/01/2015  . Language barrier to communication 02/01/2015  . Anemia 01/23/2015  . Constipation 01/21/2015  . Knee pain, bilateral 01/21/2015  . Palpitations 09/18/2013  . Shortness of breath 09/18/2013  . Advanced maternal age in pregnancy in second trimester 06/19/2013  . IBS (irritable bowel syndrome) 04/13/2003    Kerrie Pleasure 08/17/2019, 9:28 AM  [facility] [address] [place], [place], [address] Phone: [phone]   Fax:  [phone]  Name: Betty Cain MRN: [id number] Date of Birth: 04-Jul-1975

## 2019-08-20 ENCOUNTER — Encounter: Payer: Self-pay | Admitting: Nurse Practitioner

## 2019-08-20 ENCOUNTER — Ambulatory Visit (INDEPENDENT_AMBULATORY_CARE_PROVIDER_SITE_OTHER): Payer: Self-pay | Admitting: Nurse Practitioner

## 2019-08-20 ENCOUNTER — Other Ambulatory Visit: Payer: Self-pay

## 2019-08-20 VITALS — BP 129/78 | HR 96 | Ht <= 58 in | Wt 158.0 lb

## 2019-08-20 DIAGNOSIS — E1129 Type 2 diabetes mellitus with other diabetic kidney complication: Secondary | ICD-10-CM | POA: Insufficient documentation

## 2019-08-20 DIAGNOSIS — R809 Proteinuria, unspecified: Secondary | ICD-10-CM

## 2019-08-20 DIAGNOSIS — E119 Type 2 diabetes mellitus without complications: Secondary | ICD-10-CM

## 2019-08-20 DIAGNOSIS — R202 Paresthesia of skin: Secondary | ICD-10-CM

## 2019-08-20 DIAGNOSIS — E785 Hyperlipidemia, unspecified: Secondary | ICD-10-CM

## 2019-08-20 DIAGNOSIS — F419 Anxiety disorder, unspecified: Secondary | ICD-10-CM

## 2019-08-20 LAB — POCT GLUCOSE (DEVICE FOR HOME USE)
Glucose Fasting, POC: 209 mg/dL — AB (ref 70–99)
POC Glucose: 209 mg/dl — AB (ref 70–99)

## 2019-08-20 LAB — POCT URINALYSIS DIPSTICK
Bilirubin, UA: NEGATIVE
Glucose, UA: NEGATIVE
Leukocytes, UA: NEGATIVE
Nitrite, UA: NEGATIVE
Protein, UA: POSITIVE — AB
Spec Grav, UA: 1.025 (ref 1.010–1.025)
Urobilinogen, UA: 1 E.U./dL
pH, UA: 7 (ref 5.0–8.0)

## 2019-08-20 LAB — POCT GLYCOSYLATED HEMOGLOBIN (HGB A1C)
HbA1c POC (<> result, manual entry): 6.8 % (ref 4.0–5.6)
HbA1c, POC (controlled diabetic range): 6.8 % (ref 0.0–7.0)
HbA1c, POC (prediabetic range): 6.8 % — AB (ref 5.7–6.4)
Hemoglobin A1C: 6.8 % — AB (ref 4.0–5.6)

## 2019-08-20 MED ORDER — GABAPENTIN 300 MG PO CAPS: 300.0000 mg | ORAL_CAPSULE | Freq: Three times a day (TID) | ORAL | 2 refills | Status: DC

## 2019-08-20 MED ORDER — GABAPENTIN 300 MG PO CAPS: 300.0000 mg | ORAL_CAPSULE | Freq: Three times a day (TID) | ORAL | 5 refills | Status: DC

## 2019-08-20 NOTE — Patient Instructions (Addendum)
Paresthesia Paresthesia is a burning or prickling feeling. This feeling can happen in any part of the body. It often happens in the hands, arms, legs, or feet. Usually, it is not painful. In most cases, the feeling goes away in a short time and is not a sign of a serious problem. If you have paresthesia that lasts a long time, you may need to be seen by your doctor. Follow these instructions at home: Alcohol use   Do not drink alcohol if: ? Your doctor tells you not to drink. ? You are pregnant, may be pregnant, or are planning to become pregnant.  If you drink alcohol: ? Limit how much you use to:  0-1 drink a day for women.  0-2 drinks a day for men. ? Be aware of how much alcohol is in your drink. In the [place] one drink equals one 12 oz bottle of beer (355 mL), one 5 oz glass of wine (148 mL), or one 1 oz glass of hard liquor (44 mL). Nutrition   Eat a healthy diet. This includes: ? Eating foods that have a lot of fiber in them, such as fresh fruits and vegetables, whole grains, and beans. ? Limiting foods that have a lot of fat and processed sugars in them, such as fried or sweet foods. General instructions  Take over-the-counter and prescription medicines only as told by your doctor.  Do not use any products that have nicotine or tobacco in them, such as cigarettes and e-cigarettes. If you need help quitting, ask your doctor.  If you have diabetes, work with your doctor to make sure your blood sugar stays in a healthy range.  If your feet feel numb: ? Check for redness, warmth, and swelling every day. ? Wear padded socks and comfortable shoes. These help protect your feet.  Keep all follow-up visits as told by your doctor. This is important. Contact a doctor if:  You have paresthesia that gets worse or does not go away.  Your burning or prickling feeling gets worse when you walk.  You have pain or cramps.  You feel dizzy.  You have a rash. Get help right away if  you:  Feel weak.  Have trouble walking or moving.  Have problems speaking, understanding, or seeing.  Feel confused.  Cannot control when you pee (urinate) or poop (have a bowel movement).  Lose feeling (have numbness) after an injury.  Have new weakness in an arm or leg.  Pass out (faint). Summary  Paresthesia is a burning or prickling feeling. It often happens in the hands, arms, legs, or feet.  In most cases, the feeling goes away in a short time and is not a sign of a serious problem.  If you have paresthesia that lasts a long time, you may need to be seen by your doctor. This information is not intended to replace advice given to you by your health care provider. Make sure you discuss any questions you have with your health care provider. Document Revised: 04/24/2018 Document Reviewed: 04/07/2017 Elsevier Patient Education  [place].   Diabetes Mellitus and Nutrition, Adult When you have diabetes (diabetes mellitus), it is very important to have healthy eating habits because your blood sugar (glucose) levels are greatly affected by what you eat and drink. Eating healthy foods in the appropriate amounts, at about the same times every day, can help you:  Control your blood glucose.  Lower your risk of heart disease.  Improve your blood pressure.  Reach or maintain a healthy weight. Every person with diabetes is different, and each person has different needs for a meal plan. Your health care provider may recommend that you work with a diet and nutrition specialist (dietitian) to make a meal plan that is best for you. Your meal plan may vary depending on factors such as:  The calories you need.  The medicines you take.  Your weight.  Your blood glucose, blood pressure, and cholesterol levels.  Your activity level.  Other health conditions you have, such as heart or kidney disease. How do carbohydrates affect me? Carbohydrates, also called carbs, affect  your blood glucose level more than any other type of food. Eating carbs naturally raises the amount of glucose in your blood. Carb counting is a method for keeping track of how many carbs you eat. Counting carbs is important to keep your blood glucose at a healthy level, especially if you use insulin or take certain oral diabetes medicines. It is important to know how many carbs you can safely have in each meal. This is different for every person. Your dietitian can help you calculate how many carbs you should have at each meal and for each snack. Foods that contain carbs include:  Bread, cereal, rice, pasta, and crackers.  Potatoes and corn.  Peas, beans, and lentils.  Milk and yogurt.  Fruit and juice.  Desserts, such as cakes, cookies, ice cream, and candy. How does alcohol affect me? Alcohol can cause a sudden decrease in blood glucose (hypoglycemia), especially if you use insulin or take certain oral diabetes medicines. Hypoglycemia can be a life-threatening condition. Symptoms of hypoglycemia (sleepiness, dizziness, and confusion) are similar to symptoms of having too much alcohol. If your health care provider says that alcohol is safe for you, follow these guidelines:  Limit alcohol intake to no more than 1 drink per day for nonpregnant women and 2 drinks per day for men. One drink equals 12 oz of beer, 5 oz of wine, or 1 oz of hard liquor.  Do not drink on an empty stomach.  Keep yourself hydrated with water, diet soda, or unsweetened iced tea.  Keep in mind that regular soda, juice, and other mixers may contain a lot of sugar and must be counted as carbs. What are tips for following this plan?  Reading food labels  Start by checking the serving size on the "Nutrition Facts" label of packaged foods and drinks. The amount of calories, carbs, fats, and other nutrients listed on the label is based on one serving of the item. Many items contain more than one serving per  package.  Check the total grams (g) of carbs in one serving. You can calculate the number of servings of carbs in one serving by dividing the total carbs by 15. For example, if a food has 30 g of total carbs, it would be equal to 2 servings of carbs.  Check the number of grams (g) of saturated and trans fats in one serving. Choose foods that have low or no amount of these fats.  Check the number of milligrams (mg) of salt (sodium) in one serving. Most people should limit total sodium intake to less than 2,300 mg per day.  Always check the nutrition information of foods labeled as "low-fat" or "nonfat". These foods may be higher in added sugar or refined carbs and should be avoided.  Talk to your dietitian to identify your daily goals for nutrients listed on the label. Shopping  Avoid  buying canned, premade, or processed foods. These foods tend to be high in fat, sodium, and added sugar.  Shop around the outside edge of the grocery store. This includes fresh fruits and vegetables, bulk grains, fresh meats, and fresh dairy. Cooking  Use low-heat cooking methods, such as baking, instead of high-heat cooking methods like deep frying.  Cook using healthy oils, such as olive, canola, or sunflower oil.  Avoid cooking with butter, cream, or high-fat meats. Meal planning  Eat meals and snacks regularly, preferably at the same times every day. Avoid going long periods of time without eating.  Eat foods high in fiber, such as fresh fruits, vegetables, beans, and whole grains. Talk to your dietitian about how many servings of carbs you can eat at each meal.  Eat 4-6 ounces (oz) of lean protein each day, such as lean meat, chicken, fish, eggs, or tofu. One oz of lean protein is equal to: ? 1 oz of meat, chicken, or fish. ? 1 egg. ?  cup of tofu.  Eat some foods each day that contain healthy fats, such as avocado, nuts, seeds, and fish. Lifestyle  Check your blood glucose  regularly.  Exercise regularly as told by your health care provider. This may include: ? 150 minutes of moderate-intensity or vigorous-intensity exercise each week. This could be brisk walking, biking, or water aerobics. ? Stretching and doing strength exercises, such as yoga or weightlifting, at least 2 times a week.  Take medicines as told by your health care provider.  Do not use any products that contain nicotine or tobacco, such as cigarettes and e-cigarettes. If you need help quitting, ask your health care provider.  Work with a Social worker or diabetes educator to identify strategies to manage stress and any emotional and social challenges. Questions to ask a health care provider  Do I need to meet with a diabetes educator?  Do I need to meet with a dietitian?  What number can I call if I have questions?  When are the best times to check my blood glucose? Where to find more information:  American Diabetes Association: diabetes.org  Academy of Nutrition and Dietetics: www.eatright.[employer] of Diabetes and Digestive and Kidney Diseases (NIH): [employer] Summary  A healthy meal plan will help you control your blood glucose and maintain a healthy lifestyle.  Working with a diet and nutrition specialist (dietitian) can help you make a meal plan that is best for you.  Keep in mind that carbohydrates (carbs) and alcohol have immediate effects on your blood glucose levels. It is important to count carbs and to use alcohol carefully. This information is not intended to replace advice given to you by your health care provider. Make sure you discuss any questions you have with your health care provider. Document Revised: 03/11/2017 Document Reviewed: 05/03/2016 Elsevier Patient Education  2020 [employer].

## 2019-08-20 NOTE — Progress Notes (Signed)
Established Patient Office Visit  Subjective:  Patient ID: Betty Cain, female    DOB: [birth year]  Age: 44 y.o. MRN: [id number]  CC:  Chief Complaint  Patient presents with  . Follow-up    follow up, still having tigling in both feet and legs, medication isn't not helping     HPI Betty Cain presents for follow up. She   has a past medical history of Allergy, Anemia, Anxiety (01/2019), Asthma, Common migraine with intractable migraine (06/28/2017), Diabetes ([place]) (02/2019), GERD (gastroesophageal reflux disease), Hypertension, IBS (irritable bowel syndrome) May 17, 2003), Neonatal death, Palpitations, Shortness of breath, Spinal headache, Valvular heart disease, and Vitamin D deficiency (02/2019).   Diabetes Mellitus Patient presents for follow up of diabetes. Current symptoms include: hyperglycemia and paresthesia of the feet. She is on Gabentin 300 mg qhs . Symptoms have progressed to a point and plateaued. Patient denies foot ulcerations, hypoglycemia , increased appetite, nausea, polydipsia, polyuria, visual disturbances, vomiting and weight loss. Evaluation to date has included: fasting blood sugar, fasting lipid panel, hemoglobin A1C and microalbuminuria.  Home sugars: BGs have been labile ranging between 200 and 250. Current treatment: Continued sulfonylurea which has been not very effective, Continued metformin which has been somewhat effective, Continued statin which has been effective and Continued ACE inhibitor/ARB which has been effective. Last dilated eye exam: referral pending for opthalmology .  She smells ammonia periodically. She admits that at that time she checks her CBG and it is generally elevated. She records it around 220-250's. She feels like this occurs about every 15-20 days. She denies any other symptoms. Denies headache, dizziness, visual changes, shortness of breath, dyspnea on exertion, chest pain, nausea, vomiting or any edema. She denies any urinary symptoms. She has a  blood monitor but she does not monitor blood pressure.   GERD Paitent complains of heartburn. This has been associated with epigastric pain.  She denies choking on food, cough, deep pressure at base of neck, dysphagia, hematemesis, need to clear throat frequently, unexpected weight loss, waterbrash and wheezing. Symptoms have been present for several months. She denies dysphagia. She has not lost weight. She denies melena, hematochezia, hematemesis, and coffee ground emesis. Medical therapy in the past has included proton pump inhibitors.  She admits that she will be going home; [place] in July.  She is requesting follow-up prior to traveling. Past Medical History:  Diagnosis Date  . Allergy   . Anemia   . Anxiety 01/2019  . Asthma   . Common migraine with intractable migraine 06/28/2017  . Diabetes ([place]) 02/2019  . GERD (gastroesophageal reflux disease)    pos H pylori  . Hypertension    controlled with medication  . IBS (irritable bowel syndrome) 05-17-2003  . Neonatal death    Vaginal delivery, full term-lived x2 hours.   . Palpitations   . Shortness of breath   . Spinal headache   . Valvular heart disease   . Vitamin D deficiency 02/2019    Past Surgical History:  Procedure Laterality Date  . ADENOIDECTOMY     and tonsils (as a child)  . boil  05/16/2001   right elbow  . CESAREAN SECTION     x4  . CESAREAN SECTION  06/02/2011   Procedure: CESAREAN SECTION;  Surgeon: [name], MD;  Location: [facility];  Service: Gynecology;  Laterality: N/A;  Primary Cesarean Section Delivery Baby Boy @ 0004, Apgars 9/9  . CESAREAN SECTION N/A 12/10/2013   Procedure: REPEAT CESAREAN SECTION;  Surgeon: [name],  MD;  Location: [facility];  Service: Obstetrics;  Laterality: N/A;  . colonoscopy  11/23/2018   stated had issues with sleep when in for procedure  . OTHER SURGICAL HISTORY  2005   Uterine surgery     Family History  Problem Relation Age of Onset  . Diabetes Mother   . Diabetes Father    . Diabetes Sister   . Anesthesia problems Neg Hx   . Colon cancer Neg Hx   . Esophageal cancer Neg Hx   . Rectal cancer Neg Hx   . Stomach cancer Neg Hx   . Colon polyps Neg Hx     Social History   Socioeconomic History  . Marital status: Married    Spouse name: [name]  . Number of children: 4  . Years of education: Not on file  . Highest education level: Not on file  Occupational History  . Occupation: Unemployed  Tobacco Use  . Smoking status: Never Smoker  . Smokeless tobacco: Never Used  Substance and Sexual Activity  . Alcohol use: No  . Drug use: No  . Sexual activity: Yes    Birth control/protection: None, Implant    Comment: pregnant  Other Topics Concern  . Not on file  Social History Narrative   Lives with husband and child   Caffeine use: daily (tea), sometimes coffee/soda   Right handed    Social Determinants of Health   Financial Resource Strain:   . Difficulty of Paying Living Expenses:   Food Insecurity:   . Worried About Charity fundraiser in the Last Year:   . Arboriculturist in the Last Year:   Transportation Needs:   . Film/video editor (Medical):   Marland Kitchen Lack of Transportation (Non-Medical):   Physical Activity:   . Days of Exercise per Week:   . Minutes of Exercise per Session:   Stress:   . Feeling of Stress :   Social Connections:   . Frequency of Communication with Friends and Family:   . Frequency of Social Gatherings with Friends and Family:   . Attends Religious Services:   . Active Member of Clubs or Organizations:   . Attends Archivist Meetings:   Marland Kitchen Marital Status:   Intimate Partner Violence:   . Fear of Current or Ex-Partner:   . Emotionally Abused:   Marland Kitchen Physically Abused:   . Sexually Abused:     Outpatient Medications Prior to Visit  Medication Sig Dispense Refill  . acetaminophen (TYLENOL) 500 MG tablet Take 500 mg by mouth every 6 (six) hours as needed.    Marland Kitchen albuterol (PROVENTIL HFA;VENTOLIN HFA)  108 (90 Base) MCG/ACT inhaler Inhale 2 puffs into the lungs every 6 (six) hours as needed for wheezing or shortness of breath. 1 Inhaler 2  . blood glucose meter kit and supplies Dispense based on patient and insurance preference. Use up to four times daily as directed. (FOR ICD-10 E10.9, E11.9). 1 each 0  . Blood Pressure Monitoring DEVI 1 each by Does not apply route daily. 1 Device 0  . BREO ELLIPTA 100-25 MCG/INH AEPB INHALE 1 PUFF INTO THE LUNGS DAILY. 60 each 3  . FEROSUL 325 (65 Fe) MG tablet TAKE 1 TABLET (325 MG TOTAL) BY MOUTH DAILY. 30 tablet 3  . fluticasone (FLONASE) 50 MCG/ACT nasal spray Place 2 sprays into both nostrils daily. 16 g 6  . glipiZIDE (GLUCOTROL) 10 MG tablet TAKE 1 TABLET (10 MG TOTAL) BY MOUTH 2 (TWO) TIMES  DAILY BEFORE A MEAL. 60 tablet 3  . glucose blood (FREESTYLE LITE) test strip Use as instructed 100 each 12  . hydrocortisone (ANUSOL-HC) 2.5 % rectal cream Place 1 application rectally 2 (two) times daily. 30 g 1  . hydrOXYzine (ATARAX/VISTARIL) 10 MG tablet Take 1 tablet (10 mg total) by mouth 3 (three) times daily as needed. 30 tablet 2  . linaclotide (LINZESS) 72 MCG capsule Take 1 capsule (72 mcg total) by mouth daily before breakfast. 30 capsule 2  . metFORMIN (GLUCOPHAGE) 500 MG tablet Take 1 tablet (500 mg total) by mouth 2 (two) times daily with a meal. 180 tablet 3  . metoprolol succinate (TOPROL XL) 100 MG 24 hr tablet Take 1 tablet (100 mg total) by mouth daily. Take with or immediately following a meal. 90 tablet 3  . Multiple Vitamin (MULTI VITAMIN DAILY PO) Take by mouth.    . norgestimate-ethinyl estradiol (SPRINTEC 28) 0.25-35 MG-MCG tablet Take 1 tablet by mouth daily. 1 Package 11  . ondansetron (ZOFRAN-ODT) 8 MG disintegrating tablet TAKE 1 TABLET BY MOUTH EVERY 8 HOURS AS NEEDED FOR NAUSEA OR VOMITING. 30 tablet 0  . pantoprazole (PROTONIX) 40 MG tablet TAKE 1 TABLET BY MOUTH 2 TIMES DAILY BEFORE A MEAL. 60 tablet 1  . potassium chloride 20  MEQ/15ML (10%) SOLN Take 15 mLs (20 mEq total) by mouth daily. 473 mL 2  . rizatriptan (MAXALT) 10 MG tablet Take 1 tablet (10 mg total) by mouth 3 (three) times daily as needed for migraine. May repeat in 2 hours if needed 10 tablet 5  . rosuvastatin (CRESTOR) 5 MG tablet Take 1 tablet (5 mg total) by mouth at bedtime. 30 tablet 11  . TRUEplus Lancets 28G MISC USE AS DIRECTED UP TO 4 TIMES DAILY 100 each 0  . Vitamin D, Ergocalciferol, (DRISDOL) 1.25 MG (50000 UT) CAPS capsule Take 1 capsule (50,000 Units total) by mouth every 7 (seven) days. 5 capsule 6  . gabapentin (NEURONTIN) 300 MG capsule Take 2 capsules in the evening 60 capsule 5  . nortriptyline (PAMELOR) 25 MG capsule TAKE 1 CAPSULE (25 MG TOTAL) BY MOUTH AT BEDTIME. 30 capsule 1  . etonogestrel (NEXPLANON) 68 MG IMPL implant 1 each by Subdermal route once.    . methylcellulose (CITRUCEL) oral powder Take as directed, daily (Patient not taking: Reported on 08/20/2019)    . dicyclomine (BENTYL) 10 MG capsule Take 1 capsule (10 mg total) by mouth 3 (three) times daily before meals. (Patient not taking: Reported on 08/20/2019) 90 capsule 2  . Erenumab-aooe (AIMOVIG) 140 MG/ML SOAJ Inject 140 mg into the skin every 30 (thirty) days. (Patient not taking: Reported on 08/20/2019) 1 pen 11  . ipratropium (ATROVENT) 0.06 % nasal spray Place 2 sprays into both nostrils as needed for rhinitis.    Marland Kitchen levocetirizine (XYZAL) 5 MG tablet TAKE 1 TABLET (5 MG TOTAL) BY MOUTH EVERY EVENING. (Patient not taking: Reported on 08/20/2019) 30 tablet 2  . meloxicam (MOBIC) 7.5 MG tablet Take 1 tablet (7.5 mg total) by mouth daily. (Patient not taking: Reported on 08/20/2019) 30 tablet 0  . polyethylene glycol (MIRALAX) 17 g packet Take once to twice daily as needed (Patient not taking: Reported on 08/20/2019) 14 each 0  . sucralfate (CARAFATE) 1 GM/10ML suspension Take 10 mLs (1 g total) by mouth every 6 (six) hours as needed. (Patient not taking: Reported on 08/20/2019)  420 mL 2   No facility-administered medications prior to visit.    Allergies  Allergen Reactions  . Topamax [Topiramate] Itching and Rash    ROS Review of Systems  All other systems reviewed and are negative.     Objective:    Physical Exam  Constitutional: She is oriented to person, place, and time. She appears well-developed and well-nourished.  HENT:  Head: Normocephalic.  Cardiovascular: Normal rate, regular rhythm, normal heart sounds and intact distal pulses.  Pulmonary/Chest: Effort normal and breath sounds normal.  Abdominal: Bowel sounds are normal. There is abdominal tenderness.  Musculoskeletal:        General: Normal range of motion.     Cervical back: Normal range of motion.  Neurological: She is alert and oriented to person, place, and time.  Skin: Skin is warm and dry.  Psychiatric: She has a normal mood and affect. Her behavior is normal. Judgment and thought content normal.    BP 129/78 (BP Location: Left Arm, Patient Position: Sitting)   Pulse 96   Ht 4' 10" (1.473 m)   Wt 158 lb (71.7 kg)   SpO2 99%   BMI 33.02 kg/m  Wt Readings from Last 3 Encounters:  08/20/19 158 lb (71.7 kg)  07/27/19 163 lb 12.8 oz (74.3 kg)  06/14/19 164 lb (74.4 kg)     Health Maintenance Due  Topic Date Due  . PNEUMOCOCCAL POLYSACCHARIDE VACCINE AGE 70-64 HIGH RISK  Never done  . FOOT EXAM  Never done  . OPHTHALMOLOGY EXAM  Never done  . PAP SMEAR-Modifier  06/19/2016    There are no preventive care reminders to display for this patient.  Lab Results  Component Value Date   TSH 2.230 11/13/2018   Lab Results  Component Value Date   WBC 9.5 06/14/2019   HGB 10.6 (L) 06/14/2019   HCT 34.3 06/14/2019   MCV 80 06/14/2019   PLT 438 06/14/2019   Lab Results  Component Value Date   NA 140 05/21/2019   K 4.3 05/21/2019   CO2 21 05/21/2019   GLUCOSE 162 (H) 05/21/2019   BUN 11 05/21/2019   CREATININE 0.85 05/21/2019   BILITOT 0.4 05/21/2019   ALKPHOS 97  05/21/2019   AST 12 05/21/2019   ALT 14 05/21/2019   PROT 7.7 05/21/2019   ALBUMIN 4.7 05/21/2019   CALCIUM 9.8 05/21/2019   ANIONGAP 11 08/30/2018   GFR 119.42 08/28/2018   Lab Results  Component Value Date   CHOL 190 05/21/2019   Lab Results  Component Value Date   HDL 31 (L) 05/21/2019   Lab Results  Component Value Date   LDLCALC 134 (H) 05/21/2019   Lab Results  Component Value Date   TRIG 138 05/21/2019   Lab Results  Component Value Date   CHOLHDL 6.1 (H) 05/21/2019   Lab Results  Component Value Date   HGBA1C 6.8 (A) 08/20/2019   HGBA1C 6.8 08/20/2019   HGBA1C 6.8 (A) 08/20/2019   HGBA1C 6.8 08/20/2019      Assessment & Plan:   Problem List Items Addressed This Visit      High   Anxiety   Relevant Medications   gabapentin (NEURONTIN) 300 MG capsule   Diabetes mellitus without complication ([place]) - Primary   Dyslipidemia (high LDL; low HDL)   Relevant Orders   Lipid panel     Unprioritized   Diabetes mellitus with proteinuria ([facility])   Relevant Orders   POCT Urinalysis Dipstick (Completed)   POCT Glucose (Device for Home Use) (Completed)   Lipid panel   Comp. Metabolic Panel (12)  HgB A1c (Completed)   Ambulatory referral to Ophthalmology    Other Visit Diagnoses    Paresthesia       Patient education on diabetes and neuropathy   Relevant Medications   gabapentin (NEURONTIN) 300 MG capsule      Meds ordered this encounter  Medications  . DISCONTD: gabapentin (NEURONTIN) 300 MG capsule    Sig: Take 1 capsule (300 mg total) by mouth 3 (three) times daily. Take 2 capsules in the evening    Dispense:  60 capsule    Refill:  5    Order Specific Question:   Supervising Provider    Answer:   Tresa Garter [id number]  . gabapentin (NEURONTIN) 300 MG capsule    Sig: Take 1 capsule (300 mg total) by mouth 3 (three) times daily. Take 2 capsules in the evening    Dispense:  90 capsule    Refill:  2    Order Specific Question:    Supervising Provider    Answer:   Tresa Garter [[id number]]    Follow-up: Return in about 6 weeks (around 10/01/2019) for follow up.    Vevelyn Francois, NP

## 2019-08-21 ENCOUNTER — Ambulatory Visit: Payer: Self-pay

## 2019-08-21 DIAGNOSIS — M545 Low back pain, unspecified: Secondary | ICD-10-CM

## 2019-08-21 DIAGNOSIS — M79671 Pain in right foot: Secondary | ICD-10-CM

## 2019-08-21 DIAGNOSIS — M5432 Sciatica, left side: Secondary | ICD-10-CM

## 2019-08-21 DIAGNOSIS — M5431 Sciatica, right side: Secondary | ICD-10-CM

## 2019-08-21 LAB — COMP. METABOLIC PANEL (12)
AST: 11 IU/L (ref 0–40)
Albumin/Globulin Ratio: 1.3 (ref 1.2–2.2)
Albumin: 4.3 g/dL (ref 3.8–4.8)
Alkaline Phosphatase: 85 IU/L (ref 39–117)
BUN/Creatinine Ratio: 12 (ref 9–23)
BUN: 8 mg/dL (ref 6–24)
Bilirubin Total: <0.2 mg/dL (ref 0.0–1.2)
Calcium: 9.6 mg/dL (ref 8.7–10.2)
Chloride: 101 mmol/L (ref 96–106)
Creatinine, Ser: 0.68 mg/dL (ref 0.57–1.00)
GFR calc Af Amer: 126 mL/min/{1.73_m2} (ref >59)
GFR calc non Af Amer: 109 mL/min/{1.73_m2} (ref >59)
Globulin, Total: 3.3 g/dL (ref 1.5–4.5)
Glucose: 182 mg/dL — ABNORMAL HIGH (ref 65–99)
Potassium: 4.5 mmol/L (ref 3.5–5.2)
Sodium: 137 mmol/L (ref 134–144)
Total Protein: 7.6 g/dL (ref 6.0–8.5)

## 2019-08-21 LAB — LIPID PANEL
Chol/HDL Ratio: 3.6 ratio (ref 0.0–4.4)
Cholesterol, Total: 135 mg/dL (ref 100–199)
HDL: 37 mg/dL — ABNORMAL LOW (ref >39)
LDL Chol Calc (NIH): 68 mg/dL (ref 0–99)
Triglycerides: 175 mg/dL — ABNORMAL HIGH (ref 0–149)
VLDL Cholesterol Cal: 30 mg/dL (ref 5–40)

## 2019-08-21 NOTE — Therapy (Addendum)
[facility] [address] [place], [place], [address] Phone: [phone]   Fax:  [phone]  Physical Therapy Treatment  Patient Details  Name: Betty Cain MRN: [id number] Date of Birth: May 02, 1975 Referring Provider (PT): Butler Denmark, NP   Encounter Date: 08/21/2019    Past Medical History:  Diagnosis Date  . Allergy   . Anemia   . Anxiety 01/2019  . Asthma   . Common migraine with intractable migraine 06/28/2017  . Diabetes ([place]) 02/2019  . GERD (gastroesophageal reflux disease)    pos H pylori  . Hypertension    controlled with medication  . IBS (irritable bowel syndrome) 07/15/03  . Neonatal death    Vaginal delivery, full term-lived x2 hours.   . Palpitations   . Shortness of breath   . Spinal headache   . Valvular heart disease   . Vitamin D deficiency 02/2019    Past Surgical History:  Procedure Laterality Date  . ADENOIDECTOMY     and tonsils (as a child)  . boil  July 14, 2001   right elbow  . CESAREAN SECTION     x4  . CESAREAN SECTION  06/02/2011   Procedure: CESAREAN SECTION;  Surgeon: [name], MD;  Location: [facility];  Service: Gynecology;  Laterality: N/A;  Primary Cesarean Section Delivery Baby Boy @ 0004, Apgars 9/9  . CESAREAN SECTION N/A 12/10/2013   Procedure: REPEAT CESAREAN SECTION;  Surgeon: [name], MD;  Location: [facility];  Service: Obstetrics;  Laterality: N/A;  . colonoscopy  11/23/2018   stated had issues with sleep when in for procedure  . OTHER SURGICAL HISTORY  07-15-03   Uterine surgery     There were no vitals filed for this visit.       Interpreter: Reem [id number]     Treatment:  Manual therapy: STM to bil lumbar paraspainalis, thoracic and periscapular paraspinalis, bil upper trap  Treatment: Prone hip extensions: 3 x 10 Prone mid trap raises: 3 x 10 Supine knee to chest: 5 x 5" holds R and L Supine lower trunk rotations: 20x R and  L Supine piriformis stretch: 3 x 30"  Rand L.   Current HEP: Access Code: [email or web address] URL: [email or web address] Date: 08/07/2019 Prepared by: Markus Jarvis  Exercises Hooklying Single Knee to Chest - 2 x daily - 7 x weekly - 1 sets - 10 reps - 10 hold Supine Lower Trunk Rotation - 2 x daily - 7 x weekly - 1 sets - 10 reps Supine Piriformis Stretch with Foot on Ground - 2 x daily - 7 x weekly - 3 reps - 30 hold       Clinical Impression:   Patient has poor compliance with HEP. patient was educated on compliance with HEP for successful outcome. Patient reported pain decrease to 6/10 at end of the session after today's intervention             PT Short Term Goals - 08/17/19 0928      PT SHORT TERM GOAL #1   Title  Patient will report no pain in her heel with sit to stand or walking to improve her ability to stand and walk at home    Baseline  Constant pain in bil heels    Time  3    Period  Weeks    Status  New    Target Date  08/28/19      PT SHORT TERM  GOAL #2   Title  Patient will demo 100% lumbar ROM with <5/10 pain to improve ability to bend and twist with cleaning, cooking, ADLs.    Baseline  ROM limited, pain 8/10    Time  3    Period  Weeks    Status  New    Target Date  08/28/19        PT Long Term Goals - 08/17/19 [id number]      PT LONG TERM GOAL #1   Title  Pt will report no greater than 5/10 pain with her ADLs to improve her function at home.    Baseline  >8/10    Time  5    Period  Weeks    Status  New      PT LONG TERM GOAL #2   Title  Pt will demo compliance with HEP and activity modification with ADLs to improve self management of her symptoms.    Time  5    Period  Weeks    Status  New      PT LONG TERM GOAL #3   Title  Patient will demo 5/5 strength with bil LE to improve functional strength with transfers.    Baseline  4/5 in hips grossly    Time  5    Period  Weeks    Status  New              Patient will  benefit from skilled therapeutic intervention in order to improve the following deficits and impairments:  Decreased strength, Hypomobility, Difficulty walking, Increased muscle spasms, Impaired flexibility, Impaired sensation, Pain  Visit Diagnosis: Pain of both heels  Sciatica, left side  Sciatica, right side  Chronic low back pain, unspecified back pain laterality, unspecified whether sciatica present     Problem List Patient Active Problem List   Diagnosis Date Noted  . Diabetes mellitus with proteinuria ([place]) 08/20/2019  . History of Helicobacter pylori infection 08/08/2019  . Proteinuria 06/14/2019  . Elevated sed rate 05/23/2019  . Elevated C-reactive protein (CRP) 05/23/2019  . Diabetes mellitus without complication ([facility]) [id number]  . Dyslipidemia (high LDL; low HDL) 05/23/2019  . Type 2 diabetes mellitus with hyperglycemia, without long-term current use of insulin ([facility]) 02/17/2019  . Hemoglobin A1C between 7% and 9% indicating borderline diabetic control 02/17/2019  . Breast cancer screening by mammogram 02/17/2019  . Frequent headaches 02/14/2019  . Anxiety 02/14/2019  . Common migraine with intractable migraine 06/28/2017  . Chest pain at rest 10/17/2016  . Sinus tachycardia 08/18/2016  . Prolonged Q-T interval on ECG 08/18/2016  . Vertigo 07/22/2015  . Abdominal discomfort 02/14/2015  . Low back pain 02/14/2015  . Abdominal pain 02/01/2015  . Nausea 02/01/2015  . Environmental allergies 02/01/2015  . Language barrier to communication 02/01/2015  . Anemia 01/23/2015  . Constipation 01/21/2015  . Knee pain, bilateral 01/21/2015  . Palpitations 09/18/2013  . Shortness of breath 09/18/2013  . Advanced maternal age in pregnancy in second trimester 06/19/2013  . IBS (irritable bowel syndrome) 04/13/2003    Kerrie Pleasure 08/27/2019, 12:52 PM  [facility] [address] [place], [place],  [address] Phone: [phone]   Fax:  [phone]  Name: Betty Cain MRN: [id number] Date of Birth: Dec 29, 1975

## 2019-08-24 ENCOUNTER — Telehealth: Payer: Self-pay | Admitting: Nurse Practitioner

## 2019-08-24 ENCOUNTER — Ambulatory Visit: Payer: Self-pay

## 2019-08-24 NOTE — Telephone Encounter (Signed)
Called spoke with [facility] , pt has family planning medicaid. Pt will need to contact Medicaid or their case worker to have this changed.Called an explained this to the patient. Once this corrected then we can resend the referral to [facility] or [facility] ,until this corrected we can't send referral because of insurance.

## 2019-08-24 NOTE — Telephone Encounter (Signed)
[facility] called regarding referral. Pt's insurance is not taken there.

## 2019-08-28 ENCOUNTER — Ambulatory Visit: Payer: Self-pay

## 2019-08-28 ENCOUNTER — Other Ambulatory Visit: Payer: Self-pay

## 2019-08-28 DIAGNOSIS — G8929 Other chronic pain: Secondary | ICD-10-CM

## 2019-08-28 DIAGNOSIS — M545 Low back pain, unspecified: Secondary | ICD-10-CM

## 2019-08-28 DIAGNOSIS — M5431 Sciatica, right side: Secondary | ICD-10-CM

## 2019-08-28 NOTE — Therapy (Signed)
[facility] [address] [place], [place], [address] Phone: [phone]   Fax:  [phone]  Physical Therapy Treatment  Patient Details  Name: Betty Cain MRN: [id number] Date of Birth: [birth year] Referring Provider (PT): Butler Denmark, NP   Encounter Date: 08/28/2019  PT End of Session - 08/28/19 0935    Visit Number  4    Number of Visits  11    Date for PT Re-Evaluation  09/11/19    PT Start Time  0930    PT Stop Time  1010    PT Time Calculation (min)  40 min    Activity Tolerance  Patient limited by pain    Behavior During Therapy  [facility] for tasks assessed/performed       Past Medical History:  Diagnosis Date  . Allergy   . Anemia   . Anxiety 01/2019  . Asthma   . Common migraine with intractable migraine 06/28/2017  . Diabetes ([place]) 02/2019  . GERD (gastroesophageal reflux disease)    pos H pylori  . Hypertension    controlled with medication  . IBS (irritable bowel syndrome) June 01, 2003  . Neonatal death    Vaginal delivery, full term-lived x2 hours.   . Palpitations   . Shortness of breath   . Spinal headache   . Valvular heart disease   . Vitamin D deficiency 02/2019    Past Surgical History:  Procedure Laterality Date  . ADENOIDECTOMY     and tonsils (as a child)  . boil  May 31, 2001   right elbow  . CESAREAN SECTION     x4  . CESAREAN SECTION  06/02/2011   Procedure: CESAREAN SECTION;  Surgeon: [name], MD;  Location: [facility];  Service: Gynecology;  Laterality: N/A;  Primary Cesarean Section Delivery Baby Boy @ 0004, Apgars 9/9  . CESAREAN SECTION N/A 12/10/2013   Procedure: REPEAT CESAREAN SECTION;  Surgeon: [name], MD;  Location: [facility];  Service: Obstetrics;  Laterality: N/A;  . colonoscopy  11/23/2018   stated had issues with sleep when in for procedure  . OTHER SURGICAL HISTORY  Jun 01, 2003   Uterine surgery     There were no vitals filed for this visit.  Subjective Assessment -  08/28/19 0935    Subjective  Pt reports that her pain in back and down right leg as well as neck is severe. She feels down in to her heel. Pt reports that it burns and right leg feels heavy.    Patient is accompained by:  Interpreter   Azad from interpreter service   Pertinent History  DM, vertigo, mirgraines    Limitations  Lifting;Standing;Walking;House hold activities    How long can you sit comfortably?  no issues    How long can you stand comfortably?  15-20 min    How long can you walk comfortably?  15-20 min    Diagnostic tests  04/28/18: Standing frontal, standing lateral, and standing spot lumbosacrallateral images were obtained. The there are 5 non-rib-bearing lumbartype vertebral bodies. There is slight lumbar dextroscoliosis. Nofracture or spondylolisthesis. The disc spaces appear normal. Noerosive change.    Patient Stated Goals  Be able to move better    Currently in Pain?  Yes    Pain Score  9     Pain Location  Back    Pain Orientation  Right    Pain Descriptors / Indicators  Burning   heavy   Pain Type  Chronic pain  Pain Radiating Towards  down right leg to heel    Pain Onset  More than a month ago    Pain Frequency  Intermittent    Aggravating Factors   bending, household chores    Pain Relieving Factors  denies pain with walking,         [facility] PT Assessment - 08/28/19 0939      Special Tests    Special Tests  Lumbar    Other special tests  Pt with full standng lumbar flexion with pain. Decreased standing extension with pain in back. Pt denied pain with passive knee to chest. Did have some pain in right low back with right A/P hip mob. Reported increased pain in right leg with hip distraction.    Lumbar Tests  Straight Leg Raise      Straight Leg Raise   Findings  Negative    Side   Right    Comment  Negative SLR bilateral with only stretching felt.                    [facility] Adult PT Treatment/Exercise - 08/28/19 0939      Self-Care    Self-Care  Other Self-Care Comments    Other Self-Care Comments   PT discussed importance of taking time in her day for self care with performing her prone press-ups. PT explained rational for trying to get pain out of leg first and then focusing more on back. Explained that nerves can be irritated but a mulltitude of factors. Pt asking if weight could contribute. PT explained that it could but patient does not appear to be overweight. Discussed how past pregnancies could also have contributed as pt reports she did have back pain with pregnancies. Discussed how extension activities may help with decreasing pressure on nerves as seems to have extension preference starting in gravity minimized position.      Exercises   Exercises  Other Exercises    Other Exercises   Prone press-ups x 8 with verbal cues for form. Pt denied any pain down leg but did have pain in back.      Manual Therapy   Manual Therapy  Other (comment);Joint mobilization    Joint Mobilization  Prone central and unilateral PAs grade 2/3 2 bouts of 20 sec L5-T4. Pt reported most pain in lumbar and T12 area. Pt is hypomobile throughout.  Pt denied any pain in to right leg after and reported most pain "mild" just where therapist was pushing.             PT Education - 08/28/19 1023    Education Details  Added prone press-ups    Person(s) Educated  Patient    Methods  Explanation;Demonstration;Handout    Comprehension  Verbalized understanding;Returned demonstration       PT Short Term Goals - 08/28/19 1024      PT SHORT TERM GOAL #1   Title  Patient will report no pain in her heel with sit to stand or walking to improve her ability to stand and walk at home    Baseline  Pt reports that she is still getting pain in heels when she gets up in morning especially    Time  3    Period  Weeks    Status  Not Met    Target Date  08/28/19      PT SHORT TERM GOAL #2   Title  Patient will demo 100% lumbar ROM with <5/10 pain to  improve ability to bend and twist with cleaning, cooking, ADLs.    Baseline  Lumbar extension limited about 30% with 9/10 pain, full flexion but pain 9/10    Time  3    Period  Weeks    Status  Not Met    Target Date  08/28/19        PT Long Term Goals - 08/17/19 6945      PT LONG TERM GOAL #1   Title  Pt will report no greater than 5/10 pain with her ADLs to improve her function at home.    Baseline  >8/10    Time  5    Period  Weeks    Status  New      PT LONG TERM GOAL #2   Title  Pt will demo compliance with HEP and activity modification with ADLs to improve self management of her symptoms.    Time  5    Period  Weeks    Status  New      PT LONG TERM GOAL #3   Title  Patient will demo 5/5 strength with bil LE to improve functional strength with transfers.    Baseline  4/5 in hips grossly    Time  5    Period  Weeks    Status  New            Plan - 08/28/19 1025    Clinical Impression Statement  PT assessed STGs today. Pt continuing to report high pain levels 9/10. Pain 8-9/10 at end of session today. She did have decrease pain down right leg after prone press-ups and manual techniques. Pt was negative on SLR today just reporting stretching. She continues to be limited with extension ROM with pain in standing. Able to tolerate more in prone so worked more with repeated movements for extension today in this position.    Examination-Activity Limitations  Caring for Others;Carry;Lift;Squat;Stand;Transfers    Examination-Participation Restrictions  Cleaning;Community Activity;Laundry;Meal Prep;Shop;Yard Work    Stability/Clinical Decision Making  Evolving/Moderate complexity    Rehab Potential  Good    PT Frequency  2x / week    PT Duration  6 weeks    PT Treatment/Interventions  ADLs/Self Care Home Management;Therapeutic activities;Therapeutic exercise;Balance training;Neuromuscular re-education;Patient/family education;Manual techniques;Passive range of motion;Energy  conservation;Joint Manipulations;Vestibular    PT Next Visit Plan  How are prone press-ups going? Work on back safety with household activities. Continue with extension based activities if helping with pain in leg.    PT Home Exercise Plan  Access Code: [email or web address]: [email or web address]: 04/27/2021Prepared by: Gwenyth Bouillon PatelExercisesHooklying Single Knee to Chest - 2 x daily - 7 x weekly - 3 sets - 5 repsSupine Lower Trunk Rotation - 2 x daily - 7 x weekly - 3 sets - 5 reps    Consulted and Agree with Plan of Care  Patient       Patient will benefit from skilled therapeutic intervention in order to improve the following deficits and impairments:  Decreased strength, Hypomobility, Difficulty walking, Increased muscle spasms, Impaired flexibility, Impaired sensation, Pain  Visit Diagnosis: Sciatica, right side  Chronic low back pain, unspecified back pain laterality, unspecified whether sciatica present     Problem List Patient Active Problem List   Diagnosis Date Noted  . Diabetes mellitus with proteinuria ([place]) 08/20/2019  . History of Helicobacter pylori infection 08/08/2019  . Proteinuria 06/14/2019  . Elevated sed rate 05/23/2019  . Elevated C-reactive protein (CRP) 05/23/2019  . Diabetes mellitus without complication ([place])  05/23/2019  . Dyslipidemia (high LDL; low HDL) 05/23/2019  . Type 2 diabetes mellitus with hyperglycemia, without long-term current use of insulin ([place] [place]) 02/17/2019  . Hemoglobin A1C between 7% and 9% indicating borderline diabetic control 02/17/2019  . Breast cancer screening by mammogram 02/17/2019  . Frequent headaches 02/14/2019  . Anxiety 02/14/2019  . Common migraine with intractable migraine 06/28/2017  . Chest pain at rest 10/17/2016  . Sinus tachycardia 08/18/2016  . Prolonged Q-T interval on ECG 08/18/2016  . Vertigo 07/22/2015  . Abdominal discomfort 02/14/2015  . Low back pain 02/14/2015  . Abdominal pain 02/01/2015  . Nausea  02/01/2015  . Environmental allergies 02/01/2015  . Language barrier to communication 02/01/2015  . Anemia 01/23/2015  . Constipation 01/21/2015  . Knee pain, bilateral 01/21/2015  . Palpitations 09/18/2013  . Shortness of breath 09/18/2013  . Advanced maternal age in pregnancy in second trimester 06/19/2013  . IBS (irritable bowel syndrome) 04/13/2003    Electa Sniff, PT, DPT, NCS 08/28/2019, 10:30 AM  [facility] [address] [place], [place], [address] Phone: [phone]   Fax:  [phone]  Name: Ahmia Colford MRN: [id number] Date of Birth: [birth year]

## 2019-08-28 NOTE — Patient Instructions (Signed)
Access Code: [email or web address] URL: [email or web address] Date: 08/28/2019 Prepared by: Cherly Anderson  Exercises Hooklying Single Knee to Chest - 2 x daily - 7 x weekly - 1 sets - 10 reps - 10 hold Supine Lower Trunk Rotation - 2 x daily - 7 x weekly - 1 sets - 10 reps Supine Piriformis Stretch with Foot on Ground - 2 x daily - 7 x weekly - 3 reps - 30 hold Static Prone on Elbows - 2 x daily - 7 x weekly - 1 sets - 10 reps

## 2019-08-30 ENCOUNTER — Other Ambulatory Visit: Payer: Self-pay | Admitting: Gastroenterology

## 2019-08-30 ENCOUNTER — Other Ambulatory Visit: Payer: Self-pay | Admitting: Family Medicine

## 2019-08-30 DIAGNOSIS — J4520 Mild intermittent asthma, uncomplicated: Secondary | ICD-10-CM

## 2019-08-30 MED FILL — ?RIZATRIPTAN 10 MG TABLET: 10 | 30 days supply | Qty: 9 | Fill #3

## 2019-08-30 MED FILL — METFORMIN HCL 500 MG TABS: 500 | 30 days supply | Qty: 60 | Fill #5

## 2019-08-30 MED FILL — ?GLIPIZIDE 10 MG TABLET: 10 | 30 days supply | Qty: 60 | Fill #1

## 2019-08-30 MED FILL — NORTRIPTYLINE HCL 25 MG CAP: 25 | 30 days supply | Qty: 30 | Fill #1

## 2019-08-30 MED FILL — ROSUVASTATIN CALCIUM 5 MG T: 5 | 30 days supply | Qty: 30 | Fill #3

## 2019-08-30 MED FILL — METOPROLOL SUCCINATE ER 100: 100 | 30 days supply | Qty: 30 | Fill #6

## 2019-08-30 MED FILL — MONO-LINYAH 28 TABLET: 0.25-35 | 28 days supply | Qty: 28 | Fill #2

## 2019-08-30 MED FILL — !LINZESS 72 MCG CAPSULE: 72 MCG | 30 days supply | Qty: 30 | Fill #1

## 2019-08-30 MED FILL — LEVOCETIRIZINE 5 MG TABLET: 5 | 30 days supply | Qty: 30 | Fill #1

## 2019-08-30 MED FILL — ?ERGOCALCIFEROL 50000 UNITC: 1.25 MG | 28 days supply | Qty: 4 | Fill #3

## 2019-08-31 ENCOUNTER — Ambulatory Visit: Payer: Self-pay

## 2019-08-31 ENCOUNTER — Encounter: Payer: Self-pay | Admitting: Gastroenterology

## 2019-08-31 ENCOUNTER — Ambulatory Visit (INDEPENDENT_AMBULATORY_CARE_PROVIDER_SITE_OTHER): Payer: Self-pay | Admitting: Gastroenterology

## 2019-08-31 ENCOUNTER — Other Ambulatory Visit: Payer: Self-pay

## 2019-08-31 VITALS — BP 124/78 | HR 67 | Ht <= 58 in | Wt 157.0 lb

## 2019-08-31 DIAGNOSIS — R109 Unspecified abdominal pain: Secondary | ICD-10-CM

## 2019-08-31 DIAGNOSIS — G8929 Other chronic pain: Secondary | ICD-10-CM

## 2019-08-31 DIAGNOSIS — K5909 Other constipation: Secondary | ICD-10-CM

## 2019-08-31 DIAGNOSIS — M6281 Muscle weakness (generalized): Secondary | ICD-10-CM

## 2019-08-31 DIAGNOSIS — K219 Gastro-esophageal reflux disease without esophagitis: Secondary | ICD-10-CM

## 2019-08-31 DIAGNOSIS — M5431 Sciatica, right side: Secondary | ICD-10-CM

## 2019-08-31 MED ORDER — LUBIPROSTONE 8 MCG PO CAPS: 8.0000 ug | ORAL_CAPSULE | Freq: Two times a day (BID) | ORAL | 2 refills | Status: DC

## 2019-08-31 MED FILL — !VENTOLIN HFA INHALER: 108 (90 BAS | 25 days supply | Qty: 18 | Fill #0

## 2019-08-31 NOTE — Progress Notes (Signed)
HPI :  44 y/o female here for a follow up visit for abdominal pain, bowel changes. We have seen her multiple times in the past year. .She speaks Arabic and is accompanied by a Optometrist today.Previously had an H. Pylori IgG serology in April 2019  and was treated for that. He had recurrence of symptoms after treatment, was placed on Protonix and scheduled for an endoscopy with me in September. She had an EGD on January 04 2018. She had some mild gastritis noted, unfortunately her procedure was aborted due to significant laryngospasm and oxygen desaturation. Esophagus was not well evaluated but a limited exam did not appear to have any significant abnormalities. Unfortunately given the procedure was aborted, biopsies not taken toconfirm H. Pylori eradication. We stopped her Protonix and she submitted a stool study which was negative for H. Pylori.She's also had ongoing intermittent chest pains and has seen cardiology. Had prior EST which was normal.  Her main complaint previously was ongoing abdominal pain. She has had an extensive evaluation with labs, US abdomen, CT scan abdomen, and most recently a colonoscopy, none of which showed any pathology.  She has had some elevated inflammatory markers, but negative for any evidence of IBD. She tested negative for celiac disease.  He denies any fevers historically.   She has been tried on a variety of regimens to include Carafate, Protonix, gabapentin, topical capsaicin cream, Bentyl.  None of which have significantly provided any benefit.  She has been using some Zofran as needed for nausea which helps.  More recently I gave her a trial of nortriptyline 25 mg nightly.  She took this for a while and then ran out of it.  Unclear how much this helped her.  She was most recently seen by PA Alonza Bogus and she had complained of worsening constipation.  She did not think MiraLAX helped her in the past and she was given a trial of Linzess 72 mcg daily.  She  states she went from having 1 bowel movement once weekly to every 10 days to having a bowel movement every day.  Her abdominal pain has been much better on the regimen and she has been feeling better.  Her main complaint is that she now feels that stools are " hot" when they come out of her.  She denies any rectal pain.  No bleeding.  She has been taking Bentyl with this.  Generally she is doing well with exception of this particular complaint.  Otherwise has been taking Protonix twice daily for history of reflux and dyspepsia.  She has been doing okay with this recently, we discussed reducing dose if she tolerates it.  Prior workup: CT abdomen pelvis 03/16/18 - normal [place] 01/15/14 - gallbladder normal EGD 01/04/18 - significant desaturation due to laryngospasm and procedure was aborted, mild gastriits, esophagus not well evaluated   Colonoscopy 11/23/18 - The perianal and digital rectal examinations were normal. - The terminal ileum appeared normal. - The left colon was extremely tortuous. - A 3 mm polyp was found in the transverse colon. The polyp was sessile. The polyp was removed with a cold snare. Resection and retrieval were complete. - Internal hemorrhoids were found during retroflexion. - The exam was otherwise without abnormality. No obvious inflammatory changes. Of note, given the breathing pattern observed under anesthesia for this case, high suspicion for sleep Apnea. Path shows serrated polyp - repeat colonoscopy in 5 years    Past Medical History:  Diagnosis Date  . Allergy   .  Anemia   . Anxiety 01/2019  . Asthma   . Common migraine with intractable migraine 06/28/2017  . Diabetes ([place]) 02/2019  . GERD (gastroesophageal reflux disease)    pos H pylori  . Hypertension    controlled with medication  . IBS (irritable bowel syndrome) 06/27/2003  . Neonatal death    Vaginal delivery, full term-lived x2 hours.   . Palpitations   . Shortness of breath   . Spinal headache   .  Valvular heart disease   . Vitamin D deficiency 02/2019     Past Surgical History:  Procedure Laterality Date  . ADENOIDECTOMY     and tonsils (as a child)  . boil  Jun 26, 2001   right elbow  . CESAREAN SECTION     x4  . CESAREAN SECTION  06/02/2011   Procedure: CESAREAN SECTION;  Surgeon: [name], MD;  Location: [facility];  Service: Gynecology;  Laterality: N/A;  Primary Cesarean Section Delivery Baby Boy @ 0004, Apgars 9/9  . CESAREAN SECTION N/A 12/10/2013   Procedure: REPEAT CESAREAN SECTION;  Surgeon: [name], MD;  Location: [facility];  Service: Obstetrics;  Laterality: N/A;  . colonoscopy  11/23/2018   stated had issues with sleep when in for procedure  . OTHER SURGICAL HISTORY  June 27, 2003   Uterine surgery    Family History  Problem Relation Age of Onset  . Diabetes Mother   . Diabetes Father   . Diabetes Sister   . Anesthesia problems Neg Hx   . Colon cancer Neg Hx   . Esophageal cancer Neg Hx   . Rectal cancer Neg Hx   . Stomach cancer Neg Hx   . Colon polyps Neg Hx    Social History   Tobacco Use  . Smoking status: Never Smoker  . Smokeless tobacco: Never Used  Substance Use Topics  . Alcohol use: No  . Drug use: No   Current Outpatient Medications  Medication Sig Dispense Refill  . acetaminophen (TYLENOL) 500 MG tablet Take 500 mg by mouth every 6 (six) hours as needed.    . blood glucose meter kit and supplies Dispense based on patient and insurance preference. Use up to four times daily as directed. (FOR ICD-10 E10.9, E11.9). 1 each 0  . Blood Pressure Monitoring DEVI 1 each by Does not apply route daily. 1 Device 0  . BREO ELLIPTA 100-25 MCG/INH AEPB INHALE 1 PUFF INTO THE LUNGS DAILY. 60 each 3  . etonogestrel (NEXPLANON) 68 MG IMPL implant 1 each by Subdermal route once.    . FEROSUL 325 (65 Fe) MG tablet TAKE 1 TABLET (325 MG TOTAL) BY MOUTH DAILY. 30 tablet 3  . fluticasone (FLONASE) 50 MCG/ACT nasal spray Place 2 sprays into both nostrils daily. 16 g 6    . gabapentin (NEURONTIN) 300 MG capsule Take 1 capsule (300 mg total) by mouth 3 (three) times daily. Take 2 capsules in the evening 90 capsule 2  . glipiZIDE (GLUCOTROL) 10 MG tablet TAKE 1 TABLET (10 MG TOTAL) BY MOUTH 2 (TWO) TIMES DAILY BEFORE A MEAL. (Patient taking differently: Take 30 mg by mouth daily before breakfast. ) 60 tablet 3  . glucose blood (FREESTYLE LITE) test strip Use as instructed 100 each 12  . hydrocortisone (ANUSOL-HC) 2.5 % rectal cream Place 1 application rectally 2 (two) times daily. 30 g 1  . hydrOXYzine (ATARAX/VISTARIL) 10 MG tablet Take 1 tablet (10 mg total) by mouth 3 (three) times daily as needed. 30 tablet 2  .  linaclotide (LINZESS) 72 MCG capsule Take 1 capsule (72 mcg total) by mouth daily before breakfast. 30 capsule 2  . metFORMIN (GLUCOPHAGE) 500 MG tablet Take 1 tablet (500 mg total) by mouth 2 (two) times daily with a meal. 180 tablet 3  . methylcellulose (CITRUCEL) oral powder Take as directed, daily    . metoprolol succinate (TOPROL XL) 100 MG 24 hr tablet Take 1 tablet (100 mg total) by mouth daily. Take with or immediately following a meal. 90 tablet 3  . Multiple Vitamin (MULTI VITAMIN DAILY PO) Take by mouth.    . norgestimate-ethinyl estradiol (SPRINTEC 28) 0.25-35 MG-MCG tablet Take 1 tablet by mouth daily. 1 Package 11  . ondansetron (ZOFRAN-ODT) 8 MG disintegrating tablet TAKE 1 TABLET BY MOUTH EVERY 8 HOURS AS NEEDED FOR NAUSEA OR VOMITING. 30 tablet 0  . pantoprazole (PROTONIX) 40 MG tablet TAKE 1 TABLET BY MOUTH 2 TIMES DAILY BEFORE A MEAL. 60 tablet 1  . potassium chloride 20 MEQ/15ML (10%) SOLN Take 15 mLs (20 mEq total) by mouth daily. 473 mL 2  . rizatriptan (MAXALT) 10 MG tablet Take 1 tablet (10 mg total) by mouth 3 (three) times daily as needed for migraine. May repeat in 2 hours if needed 10 tablet 5  . rosuvastatin (CRESTOR) 5 MG tablet Take 1 tablet (5 mg total) by mouth at bedtime. 30 tablet 11  . TRUEplus Lancets 28G MISC USE AS  DIRECTED UP TO 4 TIMES DAILY 100 each 0  . VENTOLIN HFA 108 (90 Base) MCG/ACT inhaler INHALE 2 PUFFS INTO THE LUNGS EVERY 6 (SIX) HOURS AS NEEDED FOR WHEEZING OR SHORTNESS OF BREATH. 18 g 2  . Vitamin D, Ergocalciferol, (DRISDOL) 1.25 MG (50000 UT) CAPS capsule Take 1 capsule (50,000 Units total) by mouth every 7 (seven) days. 5 capsule 6   No current facility-administered medications for this visit.   Allergies  Allergen Reactions  . Topamax [Topiramate] Itching and Rash     Review of Systems: All systems reviewed and negative except where noted in HPI.     Lab Results  Component Value Date   CREATININE 0.68 08/20/2019   BUN 8 08/20/2019   NA 137 08/20/2019   K 4.5 08/20/2019   CL 101 08/20/2019   CO2 21 05/21/2019    Lab Results  Component Value Date   ALT 14 05/21/2019   AST 11 08/20/2019   ALKPHOS 85 08/20/2019   BILITOT <0.2 08/20/2019     Physical Exam: BP 124/78   Pulse 67   Ht 4' 10"  (1.473 m)   Wt 157 lb (71.2 kg)   BMI 32.81 kg/m  Constitutional: Pleasant,well-developed, female in no acute distress. HEENT: Normocephalic and atraumatic. Conjunctivae are normal. No scleral icterus. Neck supple.  Cardiovascular: Normal rate, regular rhythm.  Pulmonary/chest: Effort normal and breath sounds normal. No wheezing, rales or rhonchi. Abdominal: Soft, nondistended, nontender.  There are no masses palpable. DRE - Scenic standby - no anal fissure present, no mass lesion, internal hemorrhoids noted, no pain with exam Extremities: no edema Lymphadenopathy: No cervical adenopathy noted. Neurological: Alert and oriented to person place and time. Skin: Skin is warm and dry. No rashes noted. Psychiatric: Normal mood and affect. Behavior is normal.   ASSESSMENT AND PLAN: 44 year old female here for reassessment the following:  Chronic constipation / abdominal pain - her bowel frequency and abdominal pain is significantly improved on the Linzess and this  has clearly helped her, however now having complaints of "hot" stools and  burning with defecation.  Her DRE is normal and I cannot reproduce the exam.  Suspect this could be a side effect of the Linzess if she is stooling much more frequently than previous.  I think overall she has a functional bowel disorder causing her constellations of symptoms over time.  We discussed options.  She did not like taking MiraLAX it did not work well for her but she finds this particular symptom bothersome.  I reassured her I do not think anything concerning is going on to cause this.  Timeline of symptoms correlates with starting the Linzess.  I had a few samples of Amitiza in the office and we will try her on a lower dose of 8 mcg twice daily to see if that might be a bit better for her.  If she does better with this then we can give her a prescription of it.  We will hold Linzess while using Amitiza.  She can follow-up as needed  GERD / dyspepsia - doing okay on Protonix 40 mg twice daily, will try dose reducing to once daily to minimize risks.  If she has recurrence of symptoms or not doing as well can increase to twice daily as needed.  [place] Cellar, MD [facility]

## 2019-08-31 NOTE — Patient Instructions (Signed)
Access Code: [email or web address] URL: [email or web address] Date: 08/28/2019 Prepared by: Cherly Anderson  Exercises Hooklying Single Knee to Chest - 2 x daily - 7 x weekly - 1 sets - 10 reps - 10 hold Supine Lower Trunk Rotation - 2 x daily - 7 x weekly - 1 sets - 10 reps Supine Piriformis Stretch with Foot on Ground - 2 x daily - 7 x weekly - 3 reps - 30 hold Static Prone on Elbows - 2 x daily - 7 x weekly - 1 sets - 10 reps Supine Posterior Pelvic Tilt - 2 x daily - 7 x weekly - 1 sets - 10 reps Bent knee fallout - 1 x daily - 7 x weekly - 1 sets - 10 reps Seated Lower Limb Slump Neural Mobilization - 1 x daily - 7 x weekly - 2 sets - 5 reps

## 2019-08-31 NOTE — Patient Instructions (Addendum)
If you are age 42 or older, your body mass index should be between 23-30. Your Body mass index is 32.81 kg/m. If this is out of the aforementioned range listed, please consider follow up with your Primary Care Provider.  If you are age 67 or younger, your body mass index should be between 19-25. Your Body mass index is 32.81 kg/m. If this is out of the aformentioned range listed, please consider follow up with your Primary Care Provider.   Decrease Pantoprazole to 40mg  once daily.  Stop Linzess 19mcg.   Start Amitiza 75mcg - twice daily. Samples given today.   If Amitiza works then pick up prescription that was sent to pharmacy.   We have sent the following medications to your pharmacy for you to pick up at your convenience: Amitiza   Thank you for choosing me and [facility].  Dr. Havery Moros

## 2019-08-31 NOTE — Therapy (Signed)
[facility] [address] [place], [place], [address] Phone: [phone]   Fax:  [phone]  Physical Therapy Treatment  Patient Details  Name: Betty Cain MRN: [id number] Date of Birth: January 03, 1976 Referring Provider (PT): Butler Denmark, NP   Encounter Date: 08/31/2019  PT End of Session - 08/31/19 1020    Visit Number  5    Number of Visits  11    Date for PT Re-Evaluation  09/11/19    PT Start Time  7262    PT Stop Time  1102-05-26    PT Time Calculation (min)  46 min    Activity Tolerance  Patient limited by pain    Behavior During Therapy  [facility] for tasks assessed/performed       Past Medical History:  Diagnosis Date  . Allergy   . Anemia   . Anxiety 01/2019  . Asthma   . Common migraine with intractable migraine 06/28/2017  . Diabetes ([place]) 02/2019  . GERD (gastroesophageal reflux disease)    pos H pylori  . Hypertension    controlled with medication  . IBS (irritable bowel syndrome) 2003-05-27  . Neonatal death    Vaginal delivery, full term-lived x2 hours.   . Palpitations   . Shortness of breath   . Spinal headache   . Valvular heart disease   . Vitamin D deficiency 02/2019    Past Surgical History:  Procedure Laterality Date  . ADENOIDECTOMY     and tonsils (as a child)  . boil  May 26, 2001   right elbow  . CESAREAN SECTION     x4  . CESAREAN SECTION  06/02/2011   Procedure: CESAREAN SECTION;  Surgeon: [name], MD;  Location: [facility];  Service: Gynecology;  Laterality: N/A;  Primary Cesarean Section Delivery Baby Boy @ 0004, Apgars 9/9  . CESAREAN SECTION N/A 12/10/2013   Procedure: REPEAT CESAREAN SECTION;  Surgeon: [name], MD;  Location: [facility];  Service: Obstetrics;  Laterality: N/A;  . colonoscopy  11/23/2018   stated had issues with sleep when in for procedure  . OTHER SURGICAL HISTORY  05/27/03   Uterine surgery     There were no vitals filed for this visit.  Subjective Assessment -  08/31/19 1020    Subjective  Pt reports that her back has been doing better since doing the prone press-ups. Pain does come back afterwards. She is still getting pain in to right heel when she is walking and has more trouble first thing in morning.    Patient is accompained by:  Interpreter   Soha from interpreter service   Pertinent History  DM, vertigo, mirgraines    Limitations  Lifting;Standing;Walking;House hold activities    How long can you sit comfortably?  no issues    How long can you stand comfortably?  15-20 min    How long can you walk comfortably?  15-20 min    Diagnostic tests  04/28/18: Standing frontal, standing lateral, and standing spot lumbosacrallateral images were obtained. The there are 5 non-rib-bearing lumbartype vertebral bodies. There is slight lumbar dextroscoliosis. Nofracture or spondylolisthesis. The disc spaces appear normal. Noerosive change.    Patient Stated Goals  Be able to move better    Currently in Pain?  Yes    Pain Score  8     Pain Location  Back    Pain Orientation  Right    Pain Descriptors / Indicators  Sharp    Pain Type  Chronic  pain    Pain Radiating Towards  down to right leg and heel    Pain Onset  More than a month ago                        [facility] Adult PT Treatment/Exercise - 08/31/19 1027      Exercises   Exercises  Other Exercises    Other Exercises   Prone press-ups x 10. Pt reported pain in right leg still with press-ups today and some pain in lower thoracic area. Pt gets most movement around L3 area. Supine posterior pelvic tilts x 10. Pt needed max verbal and tactile cuing to get correct form and to try not to hold her breath. Denied any pain with activity. Hooklying unilateral bent knee fall-outs with cues to brace tummy to keep back flat x 5 each side.  Pt performed seated self sciatic nerve glides with right knee extended with added ankle pumps x 10. Denies any increased pain in leg just felt tightness in quad.       Manual Therapy   Manual Therapy  Joint mobilization;Neural Stretch    Joint Mobilization  Prone central and unilateral PAs 10 sec x 2 bouts grade 2L5-T8. Pt most pain in upper lumbar and lower thoracic area especially on right. Did report some pain in to right leg with central and right unilateral at L2-5 level. Pt hypomobile in thoracic area. Left sidelying for neutral gapping to L3-5 with right hip flexed 3 bouts of 20 sec. Pt reported some pain at L5 level in to right hip.    Neural Stretch  PT performed passive sciatic nerve glides on right in supine within pain free range with adding in DF at ankle x 10.             PT Education - 08/31/19 1122    Education Details  Added beginning core stab to HEP as well as seated self neural glide on right. Pt asking why gabapentin was not helping with numbness. PT explained that it tends to help more with nerve pain and not the numbness. Discussed how numbness in feet may be due to her diabetes as well. She reports MD did just increase her diabetic med as sugar was higher.    Person(s) Educated  Patient    Methods  Explanation;Demonstration;Handout    Comprehension  Verbalized understanding;Returned demonstration       PT Short Term Goals - 08/31/19 1026      PT SHORT TERM GOAL #1   Title  Patient will report no pain in her heel with sit to stand or walking to improve her ability to stand and walk at home    Baseline  Pt reports that she is still getting pain in heels when she gets up in morning especially    Time  3    Period  Weeks    Status  Not Met    Target Date  08/28/19      PT SHORT TERM GOAL #2   Title  Patient will demo 100% lumbar ROM with <5/10 pain to improve ability to bend and twist with cleaning, cooking, ADLs.    Baseline  Lumbar extension limited about 30% with 9/10 pain, full flexion but pain 9/10    Time  3    Period  Weeks    Status  Not Met    Target Date  08/28/19        PT Long Term Goals -  08/17/19 0928       PT LONG TERM GOAL #1   Title  Pt will report no greater than 5/10 pain with her ADLs to improve her function at home.    Baseline  >8/10    Time  5    Period  Weeks    Status  New      PT LONG TERM GOAL #2   Title  Pt will demo compliance with HEP and activity modification with ADLs to improve self management of her symptoms.    Time  5    Period  Weeks    Status  New      PT LONG TERM GOAL #3   Title  Patient will demo 5/5 strength with bil LE to improve functional strength with transfers.    Baseline  4/5 in hips grossly    Time  5    Period  Weeks    Status  New            Plan - 08/31/19 1125    Clinical Impression Statement  Pt continues to report pain down right leg with worst pain in right heel. Some pain/numbness in feet may be attributable to her diabetes. She is on gabapentin. Pt is hypomobile in thoracic spine with most motion occuring around L3/4 area. Continued with prone press-ups today but no improvement in symptoms in foot. Progressed to adding in core stab which pt needed max cuing to perform correctly but no pain with performance. Also added in gentle neural glide for sciatic nerve.    Examination-Activity Limitations  Caring for Others;Carry;Lift;Squat;Stand;Transfers    Examination-Participation Restrictions  Cleaning;Community Activity;Laundry;Meal Prep;Shop;Yard Work    Stability/Clinical Decision Making  Evolving/Moderate complexity    Rehab Potential  Good    PT Frequency  2x / week    PT Duration  6 weeks    PT Treatment/Interventions  ADLs/Self Care Home Management;Therapeutic activities;Therapeutic exercise;Balance training;Neuromuscular re-education;Patient/family education;Manual techniques;Passive range of motion;Energy conservation;Joint Manipulations;Vestibular    PT Next Visit Plan  How are additions to HEP going?  Work on back safety with household activities. Continue with extension based activities if helping with pain in leg. Progress  core stabilization.    PT Home Exercise Plan  --    Consulted and Agree with Plan of Care  Patient       Patient will benefit from skilled therapeutic intervention in order to improve the following deficits and impairments:  Decreased strength, Hypomobility, Difficulty walking, Increased muscle spasms, Impaired flexibility, Impaired sensation, Pain  Visit Diagnosis: Sciatica, right side  Chronic low back pain, unspecified back pain laterality, unspecified whether sciatica present  Muscle weakness (generalized)     Problem List Patient Active Problem List   Diagnosis Date Noted  . Diabetes mellitus with proteinuria ([place]) 08/20/2019  . History of Helicobacter pylori infection 08/08/2019  . Proteinuria 06/14/2019  . Elevated sed rate 05/23/2019  . Elevated C-reactive protein (CRP) 05/23/2019  . Diabetes mellitus without complication ([place]) 10/62/6948  . Dyslipidemia (high LDL; low HDL) 05/23/2019  . Type 2 diabetes mellitus with hyperglycemia, without long-term current use of insulin ([place]) 02/17/2019  . Hemoglobin A1C between 7% and 9% indicating borderline diabetic control 02/17/2019  . Breast cancer screening by mammogram 02/17/2019  . Frequent headaches 02/14/2019  . Anxiety 02/14/2019  . Common migraine with intractable migraine 06/28/2017  . Chest pain at rest 10/17/2016  . Sinus tachycardia 08/18/2016  . Prolonged Q-T interval on ECG 08/18/2016  . Vertigo 07/22/2015  .  Abdominal discomfort 02/14/2015  . Low back pain 02/14/2015  . Abdominal pain 02/01/2015  . Nausea 02/01/2015  . Environmental allergies 02/01/2015  . Language barrier to communication 02/01/2015  . Anemia 01/23/2015  . Constipation 01/21/2015  . Knee pain, bilateral 01/21/2015  . Palpitations 09/18/2013  . Shortness of breath 09/18/2013  . Advanced maternal age in pregnancy in second trimester 06/19/2013  . IBS (irritable bowel syndrome) 04/13/2003    Electa Sniff, PT, DPT, NCS 08/31/2019,  11:37 AM  [facility] [address] [place], [place], [address] Phone: [phone]   Fax:  [phone]  Name: Betty Cain MRN: [id number] Date of Birth: 1976-01-29

## 2019-09-03 ENCOUNTER — Other Ambulatory Visit: Payer: Self-pay | Admitting: Family Medicine

## 2019-09-03 DIAGNOSIS — J3089 Other allergic rhinitis: Secondary | ICD-10-CM

## 2019-09-03 MED FILL — BREO ELLIPTA 100-25 MCG INH: 100-25 | 30 days supply | Qty: 60 | Fill #0

## 2019-09-03 MED FILL — FLUTICASONE PROP 50 MCG SPR: 50 | 30 days supply | Qty: 16 | Fill #0

## 2019-09-04 ENCOUNTER — Other Ambulatory Visit: Payer: Self-pay

## 2019-09-04 ENCOUNTER — Ambulatory Visit: Payer: Self-pay

## 2019-09-04 ENCOUNTER — Telehealth: Payer: Self-pay | Admitting: Neurology

## 2019-09-04 DIAGNOSIS — M79671 Pain in right foot: Secondary | ICD-10-CM

## 2019-09-04 DIAGNOSIS — M5431 Sciatica, right side: Secondary | ICD-10-CM

## 2019-09-04 DIAGNOSIS — M6281 Muscle weakness (generalized): Secondary | ICD-10-CM

## 2019-09-04 DIAGNOSIS — M545 Low back pain, unspecified: Secondary | ICD-10-CM

## 2019-09-04 NOTE — Therapy (Signed)
[facility] [address] [place], [place], [address] Phone: [phone]   Fax:  [phone]  Physical Therapy Treatment  Patient Details  Name: Betty Cain MRN: [id number] Date of Birth: [birth year] Referring Provider (PT): Butler Denmark, NP   Encounter Date: 09/04/2019  PT End of Session - 09/04/19 1500    Visit Number  6    Number of Visits  11    Date for PT Re-Evaluation  09/11/19    PT Start Time  1102    PT Stop Time  1146    PT Time Calculation (min)  44 min    Activity Tolerance  Patient limited by pain    Behavior During Therapy  [facility] for tasks assessed/performed       Past Medical History:  Diagnosis Date  . Allergy   . Anemia   . Anxiety 01/2019  . Asthma   . Common migraine with intractable migraine 06/28/2017  . Diabetes ([place]) 02/2019  . GERD (gastroesophageal reflux disease)    pos H pylori  . Hypertension    controlled with medication  . IBS (irritable bowel syndrome) Jun 15, 2003  . Neonatal death    Vaginal delivery, full term-lived x2 hours.   . Palpitations   . Shortness of breath   . Spinal headache   . Valvular heart disease   . Vitamin D deficiency 02/2019    Past Surgical History:  Procedure Laterality Date  . ADENOIDECTOMY     and tonsils (as a child)  . boil  Jun 14, 2001   right elbow  . CESAREAN SECTION     x4  . CESAREAN SECTION  06/02/2011   Procedure: CESAREAN SECTION;  Surgeon: [name], MD;  Location: [facility];  Service: Gynecology;  Laterality: N/A;  Primary Cesarean Section Delivery Baby Boy @ 0004, Apgars 9/9  . CESAREAN SECTION N/A 12/10/2013   Procedure: REPEAT CESAREAN SECTION;  Surgeon: [name], MD;  Location: [facility];  Service: Obstetrics;  Laterality: N/A;  . colonoscopy  11/23/2018   stated had issues with sleep when in for procedure  . OTHER SURGICAL HISTORY  15-Jun-2003   Uterine surgery     There were no vitals filed for this visit.  Subjective Assessment -  09/04/19 1104    Subjective  The exercises that she was doing accompained pain. The pain extended all the way down the foot. Patient reports that she is having pain today. Patient reports that she is having the pain it difficult to breath, feels pressure on the back and radiates.    Patient is accompained by:  Interpreter   Ahmed from interpreter service [phone])   Pertinent History  DM, vertigo, mirgraines    Limitations  Lifting;Standing;Walking;House hold activities    How long can you sit comfortably?  no issues    How long can you stand comfortably?  15-20 min    How long can you walk comfortably?  15-20 min    Diagnostic tests  04/28/18: Standing frontal, standing lateral, and standing spot lumbosacrallateral images were obtained. The there are 5 non-rib-bearing lumbartype vertebral bodies. There is slight lumbar dextroscoliosis. Nofracture or spondylolisthesis. The disc spaces appear normal. Noerosive change.    Patient Stated Goals  Be able to move better    Currently in Pain?  Yes    Pain Score  8     Pain Location  Back    Pain Orientation  Right    Pain Descriptors / Indicators  Throbbing  Pain Type  Chronic pain    Pain Radiating Towards  down right leg and heel    Pain Onset  More than a month ago                        [facility] Adult PT Treatment/Exercise - 09/04/19 1739      Self-Care   Self-Care  Other Self-Care Comments    Other Self-Care Comments   PT providined extensive education on completion of self STM with tennis ball at home for further improvements in symptoms due to patient positive repsonse with manual therapy. Educated on proper completion, with patient verbalizing and returning proper demonstration today.       Exercises   Exercises  Other Exercises;Lumbar    Other Exercises   Completed prone press ups x 6 reps. By rep 6, patient reporting increased pain. Verbal cues required for form. Changed positioned, and completed seated lumbar  extensions, 2 x 5 reps. Pt reporting that this did not cause as much pain in back, so PT educated patient to try these at home.      Lumbar Exercises: Stretches   Other Lumbar Stretch Exercise  completed single knee to chest on RLE/LLE, patient only able to tolerate 1 rep x 20 seconds on each leg. Completed passively by PT to patients tolerance.       Manual Therapy   Manual Therapy  Joint mobilization;Soft tissue mobilization    Joint Mobilization  Prone central and unilateral PA's from T4-L5, grades 1 and 2 due to increased pain, bouts of 25-30 secs. Patient still is hypomobile throughout, some instances of mild pain with mobilization.     Soft tissue mobilization  PT completed STM to R/L lumbar and thoracic paraspinal musculature with increased muscle tension noted throughout. Increased R>L. Pt had minimal pain with completion.              PT Education - 09/04/19 1738    Education Details  Educated to try seated lumbar extensions vs. prone press ups. Also educated on STM to lumbar paraspinals with tennis ball due to positive response to manual therapy.    Person(s) Educated  Patient    Methods  Explanation;Demonstration    Comprehension  Returned demonstration;Verbalized understanding       PT Short Term Goals - 08/31/19 1026      PT SHORT TERM GOAL #1   Title  Patient will report no pain in her heel with sit to stand or walking to improve her ability to stand and walk at home    Baseline  Pt reports that she is still getting pain in heels when she gets up in morning especially    Time  3    Period  Weeks    Status  Not Met    Target Date  08/28/19      PT SHORT TERM GOAL #2   Title  Patient will demo 100% lumbar ROM with <5/10 pain to improve ability to bend and twist with cleaning, cooking, ADLs.    Baseline  Lumbar extension limited about 30% with 9/10 pain, full flexion but pain 9/10    Time  3    Period  Weeks    Status  Not Met    Target Date  08/28/19         PT Long Term Goals - 08/17/19 0928      PT LONG TERM GOAL #1   Title  Pt will report no  greater than 5/10 pain with her ADLs to improve her function at home.    Baseline  >8/10    Time  5    Period  Weeks    Status  New      PT LONG TERM GOAL #2   Title  Pt will demo compliance with HEP and activity modification with ADLs to improve self management of her symptoms.    Time  5    Period  Weeks    Status  New      PT LONG TERM GOAL #3   Title  Patient will demo 5/5 strength with bil LE to improve functional strength with transfers.    Baseline  4/5 in hips grossly    Time  5    Period  Weeks    Status  New            Plan - 09/04/19 1750    Clinical Impression Statement  Patient continues to have pain down the R leg with today's session. Patient reporting increased pain with prone press ups. PT providing extensive education on proper form and completion of HEP at home, pain management, and self STM with tennis ball at home. Pt will continue to benefit from skilled PT services to address deficits and progress toward LTGs.    Examination-Activity Limitations  Caring for Others;Carry;Lift;Squat;Stand;Transfers    Examination-Participation Restrictions  Cleaning;Community Activity;Laundry;Meal Prep;Shop;Yard Work    Stability/Clinical Decision Making  Evolving/Moderate complexity    Rehab Potential  Good    PT Frequency  2x / week    PT Duration  6 weeks    PT Treatment/Interventions  ADLs/Self Care Home Management;Therapeutic activities;Therapeutic exercise;Balance training;Neuromuscular re-education;Patient/family education;Manual techniques;Passive range of motion;Energy conservation;Joint Manipulations;Vestibular    PT Next Visit Plan  How is HEP going?  Work on back safety with household activities. Continue with extension based activities if helping with pain in leg. Progress core stabilization.    Consulted and Agree with Plan of Care  Patient       Patient will  benefit from skilled therapeutic intervention in order to improve the following deficits and impairments:  Decreased strength, Hypomobility, Difficulty walking, Increased muscle spasms, Impaired flexibility, Impaired sensation, Pain  Visit Diagnosis: Sciatica, right side  Chronic low back pain, unspecified back pain laterality, unspecified whether sciatica present  Muscle weakness (generalized)  Pain of both heels     Problem List Patient Active Problem List   Diagnosis Date Noted  . Diabetes mellitus with proteinuria ([place]) 08/20/2019  . History of Helicobacter pylori infection 08/08/2019  . Proteinuria 06/14/2019  . Elevated sed rate 05/23/2019  . Elevated C-reactive protein (CRP) 05/23/2019  . Diabetes mellitus without complication ([place]) 17/51/0258  . Dyslipidemia (high LDL; low HDL) 05/23/2019  . Type 2 diabetes mellitus with hyperglycemia, without long-term current use of insulin ([place]) 02/17/2019  . Hemoglobin A1C between 7% and 9% indicating borderline diabetic control 02/17/2019  . Breast cancer screening by mammogram 02/17/2019  . Frequent headaches 02/14/2019  . Anxiety 02/14/2019  . Common migraine with intractable migraine 06/28/2017  . Chest pain at rest 10/17/2016  . Sinus tachycardia 08/18/2016  . Prolonged Q-T interval on ECG 08/18/2016  . Vertigo 07/22/2015  . Abdominal discomfort 02/14/2015  . Low back pain 02/14/2015  . Abdominal pain 02/01/2015  . Nausea 02/01/2015  . Environmental allergies 02/01/2015  . Language barrier to communication 02/01/2015  . Anemia 01/23/2015  . Constipation 01/21/2015  . Knee pain, bilateral 01/21/2015  . Palpitations 09/18/2013  .  Shortness of breath 09/18/2013  . Advanced maternal age in pregnancy in second trimester 06/19/2013  . IBS (irritable bowel syndrome) 04/13/2003    Jones Bales, PT, DPT 09/04/2019, 5:54 PM  [facility] [address] [place], [place], [address] Phone: [phone]   Fax:  [phone]  Name: Betty Cain MRN: [id number] Date of Birth: 25-Jan-1976

## 2019-09-04 NOTE — Telephone Encounter (Signed)
Pat from [employer] called stating that they have received the pt's reapplication and a prescription for the Erenumab-aooe (AIMOVIG) 140 MG/ML SOAJ however the prescriptions were not completely filled out and they are needing a valid prescription for the pt. If any questions please call [phone] and please fax valid prescription to fax number [phone]

## 2019-09-05 ENCOUNTER — Other Ambulatory Visit: Payer: Self-pay | Admitting: *Deleted

## 2019-09-05 ENCOUNTER — Telehealth: Payer: Self-pay | Admitting: Clinical

## 2019-09-05 MED ORDER — AIMOVIG 140 MG/ML ~~LOC~~ SOAJ: 1.0000 mg | SUBCUTANEOUS | 11 refills | Status: DC

## 2019-09-05 NOTE — Telephone Encounter (Signed)
Thanks

## 2019-09-05 NOTE — Telephone Encounter (Signed)
Can you assist this patient in any way with how to get started or provide me with the contact information to provide her. Thank you in advance.

## 2019-09-05 NOTE — Telephone Encounter (Signed)
Integrated Behavioral Health Case Management Referral Note  09/05/2019 Name: Betty Cain MRN: [id number] DOB: 1975/04/14 Betty Cain is a 44 y.o. year old female who sees Vevelyn Francois, NP for primary care. LCSW was consulted to assess patient's needs and assist the patient with Financial Difficulties related to lack of health insurance coverage.  Interpreter: Yes.     Interpreter Name & Language: [place] ID #[id number] - Arabic  Assessment: Called patient with assistance of Arabic interpreter. Patient experiencing Financial constraints related to lack of health insurance coverage. Patient has family planning Medicaid and [facility] financial assistance. This does not cover providers outside of the [facility] network and patient has been referred to some specialists - ophthalmology and nephrology - requiring her to pay out of pocket. She sent a message to her PCP requesting help with Medicaid. Patient applied for adult Medicaid last year but did not receive any follow up. She believes her family planning Medicaid is still active. She has had a [employer] in the past and is agreeable to apply for one again. [facility] also has nephrology and ophthalmology in network. Will assist in scheduling patient for a financial assistance appointment at [facility]. Will mail [employer] application to patient ahead of that appointment. Advised patient to complete the application and bring all supporting documents to the appointment.   Review of patient status, including review of consultants reports, relevant laboratory and other test results, and collaboration with appropriate care team members and the patient's provider was performed as part of comprehensive patient evaluation and provision of services.    SDOH (Social Determinants of Health) assessments performed: No  Outpatient Encounter Medications as of 09/05/2019  Medication Sig  . acetaminophen (TYLENOL) 500 MG tablet Take  500 mg by mouth every 6 (six) hours as needed.  . blood glucose meter kit and supplies Dispense based on patient and insurance preference. Use up to four times daily as directed. (FOR ICD-10 E10.9, E11.9).  Marland Kitchen Blood Pressure Monitoring DEVI 1 each by Does not apply route daily.  Marland Kitchen BREO ELLIPTA 100-25 MCG/INH AEPB INHALE 1 PUFF INTO THE LUNGS DAILY.  Marland Kitchen Erenumab-aooe (AIMOVIG) 140 MG/ML SOAJ Inject 1 mg into the skin every 30 (thirty) days.  Marland Kitchen etonogestrel (NEXPLANON) 68 MG IMPL implant 1 each by Subdermal route once.  . FEROSUL 325 (65 Fe) MG tablet TAKE 1 TABLET (325 MG TOTAL) BY MOUTH DAILY.  . fluticasone (FLONASE) 50 MCG/ACT nasal spray PLACE 2 SPRAYS INTO BOTH NOSTRILS DAILY.  Marland Kitchen gabapentin (NEURONTIN) 300 MG capsule Take 1 capsule (300 mg total) by mouth 3 (three) times daily. Take 2 capsules in the evening  . glipiZIDE (GLUCOTROL) 10 MG tablet TAKE 1 TABLET (10 MG TOTAL) BY MOUTH 2 (TWO) TIMES DAILY BEFORE A MEAL. (Patient taking differently: Take 30 mg by mouth daily before breakfast. )  . glucose blood (FREESTYLE LITE) test strip Use as instructed  . hydrocortisone (ANUSOL-HC) 2.5 % rectal cream Place 1 application rectally 2 (two) times daily.  . hydrOXYzine (ATARAX/VISTARIL) 10 MG tablet Take 1 tablet (10 mg total) by mouth 3 (three) times daily as needed.  . linaclotide (LINZESS) 72 MCG capsule Take 1 capsule (72 mcg total) by mouth daily before breakfast.  . lubiprostone (AMITIZA) 8 MCG capsule Take 1 capsule (8 mcg total) by mouth 2 (two) times daily with a meal.  . metFORMIN (GLUCOPHAGE) 500 MG tablet Take 1 tablet (500 mg total) by mouth 2 (two) times daily with a  meal.  . methylcellulose (CITRUCEL) oral powder Take as directed, daily  . metoprolol succinate (TOPROL XL) 100 MG 24 hr tablet Take 1 tablet (100 mg total) by mouth daily. Take with or immediately following a meal.  . Multiple Vitamin (MULTI VITAMIN DAILY PO) Take by mouth.  . norgestimate-ethinyl estradiol (SPRINTEC 28)  0.25-35 MG-MCG tablet Take 1 tablet by mouth daily.  . ondansetron (ZOFRAN-ODT) 8 MG disintegrating tablet TAKE 1 TABLET BY MOUTH EVERY 8 HOURS AS NEEDED FOR NAUSEA OR VOMITING.  . pantoprazole (PROTONIX) 40 MG tablet TAKE 1 TABLET BY MOUTH 2 TIMES DAILY BEFORE A MEAL.  Marland Kitchen potassium chloride 20 MEQ/15ML (10%) SOLN Take 15 mLs (20 mEq total) by mouth daily.  . rizatriptan (MAXALT) 10 MG tablet Take 1 tablet (10 mg total) by mouth 3 (three) times daily as needed for migraine. May repeat in 2 hours if needed  . rosuvastatin (CRESTOR) 5 MG tablet Take 1 tablet (5 mg total) by mouth at bedtime.  . TRUEplus Lancets 28G MISC USE AS DIRECTED UP TO 4 TIMES DAILY  . VENTOLIN HFA 108 (90 Base) MCG/ACT inhaler INHALE 2 PUFFS INTO THE LUNGS EVERY 6 (SIX) HOURS AS NEEDED FOR WHEEZING OR SHORTNESS OF BREATH.  Marland Kitchen Vitamin D, Ergocalciferol, (DRISDOL) 1.25 MG (50000 UT) CAPS capsule Take 1 capsule (50,000 Units total) by mouth every 7 (seven) days.   No facility-administered encounter medications on file as of 09/05/2019.    Goals Addressed   None      Follow up Plan: 1. Mail patient orange card application and schedule patient for financial counseling appointment at [facility] [phone]

## 2019-09-06 NOTE — Telephone Encounter (Signed)
Fax confirmation received prescription to [employer] [facility] [employer] [facility] [phone].

## 2019-09-07 ENCOUNTER — Other Ambulatory Visit: Payer: Self-pay

## 2019-09-07 ENCOUNTER — Telehealth: Payer: Self-pay

## 2019-09-07 ENCOUNTER — Ambulatory Visit: Payer: Self-pay

## 2019-09-07 DIAGNOSIS — M6281 Muscle weakness (generalized): Secondary | ICD-10-CM

## 2019-09-07 DIAGNOSIS — M545 Low back pain, unspecified: Secondary | ICD-10-CM

## 2019-09-07 NOTE — Patient Instructions (Signed)
Access Code: [email or web address] URL: [email or web address] Date: 08/28/2019 Prepared by: Cherly Anderson  Exercises Hooklying Single Knee to Chest - 2 x daily - 7 x weekly - 1 sets - 10 reps - 10 hold Supine Lower Trunk Rotation - 2 x daily - 7 x weekly - 1 sets - 10 reps Supine Piriformis Stretch with Foot on Ground - 2 x daily - 7 x weekly - 3 reps - 30 hold Static Prone on Elbows - 2 x daily - 7 x weekly - 1 sets - 10 reps Supine Posterior Pelvic Tilt - 2 x daily - 7 x weekly - 1 sets - 10 reps Bent knee fallout - 1 x daily - 7 x weekly - 1 sets - 10 reps Seated Lower Limb Slump Neural Mobilization - 1 x daily - 7 x weekly - 2 sets - 5 reps Supine Heel Slide - 1 x daily - 7 x weekly - 1 sets - 10 reps Beginner Bridge - 1 x daily - 7 x weekly - 1 sets - 10 reps Seated Scapular Retraction - 1 x daily - 7 x weekly - 1 sets - 10 reps

## 2019-09-07 NOTE — Telephone Encounter (Signed)
Opened in error

## 2019-09-07 NOTE — Therapy (Signed)
[facility] [address] [place], [place], [address] Phone: [phone]   Fax:  [phone]  Physical Therapy Treatment  Patient Details  Name: Betty Cain MRN: [id number] Date of Birth: December 22, 1975 Referring Provider (PT): Butler Denmark, NP   Encounter Date: 09/07/2019  PT End of Session - 09/07/19 0938    Visit Number  7    Number of Visits  11    Date for PT Re-Evaluation  09/11/19    PT Start Time  0936    PT Stop Time  1017    PT Time Calculation (min)  41 min    Activity Tolerance  Patient tolerated treatment well    Behavior During Therapy  [facility] for tasks assessed/performed       Past Medical History:  Diagnosis Date  . Allergy   . Anemia   . Anxiety 01/2019  . Asthma   . Common migraine with intractable migraine 06/28/2017  . Diabetes ([place]) 02/2019  . GERD (gastroesophageal reflux disease)    pos H pylori  . Hypertension    controlled with medication  . IBS (irritable bowel syndrome) 06-20-2003  . Neonatal death    Vaginal delivery, full term-lived x2 hours.   . Palpitations   . Shortness of breath   . Spinal headache   . Valvular heart disease   . Vitamin D deficiency 02/2019    Past Surgical History:  Procedure Laterality Date  . ADENOIDECTOMY     and tonsils (as a child)  . boil  06/19/2001   right elbow  . CESAREAN SECTION     x4  . CESAREAN SECTION  06/02/2011   Procedure: CESAREAN SECTION;  Surgeon: [name], MD;  Location: [facility];  Service: Gynecology;  Laterality: N/A;  Primary Cesarean Section Delivery Baby Boy @ 0004, Apgars 9/9  . CESAREAN SECTION N/A 12/10/2013   Procedure: REPEAT CESAREAN SECTION;  Surgeon: [name], MD;  Location: [facility];  Service: Obstetrics;  Laterality: N/A;  . colonoscopy  11/23/2018   stated had issues with sleep when in for procedure  . OTHER SURGICAL HISTORY  06/20/2003   Uterine surgery     There were no vitals filed for this visit.  Subjective Assessment  - 09/07/19 0938    Subjective  Pt reports that she is tired from having cleaned her whole house this morning as she has guests coming. Reports that he back is doing better than it was. Currently does have some pain in right low back/hip area, mid back and notes some tightness in right lower leg. Pt has tried the new exercises some.    Patient is accompained by:  Interpreter   Katharine Look, interpreter   Pertinent History  DM, vertigo, mirgraines    Limitations  Lifting;Standing;Walking;House hold activities    How long can you sit comfortably?  no issues    How long can you stand comfortably?  15-20 min    How long can you walk comfortably?  15-20 min    Diagnostic tests  04/28/18: Standing frontal, standing lateral, and standing spot lumbosacrallateral images were obtained. The there are 5 non-rib-bearing lumbartype vertebral bodies. There is slight lumbar dextroscoliosis. Nofracture or spondylolisthesis. The disc spaces appear normal. Noerosive change.    Patient Stated Goals  Be able to move better    Currently in Pain?  Yes    Pain Score  7     Pain Location  Back    Pain Orientation  Mid;Lower  Pain Descriptors / Indicators  Aching    Pain Type  Chronic pain    Pain Onset  More than a month ago                        [facility] Adult PT Treatment/Exercise - 09/07/19 0943      Therapeutic Activites    Therapeutic Activities  Lifting;Other Therapeutic Activities    Lifting  PT demonstrated proper lifting techniques with trash can and with basket from floor level. Instructed to bend at knee with more of a squat position keeping back flat and being sure to keep object close to her. Then demonstrated how to avoid twisiting back with turning at feet to place object on counter. For lighter objects if near somewhere she can hold demonstrated golfer's pick up with holding to counter. Pt performed squat lift technique x 3 with basket filled with bean bags. She did have some difficulty with  proper form with squat with bending knees too far forward. Was able to keep basket close after cuing then turned to place on counter. PT had patient work on squat technique with chair behind her. PT demonstrated bringing bottom back and keeping back flat to prevent knees coming over her toes. Pt was then able to perform with better form x 6 reps. Pt performed golfers pick up x 2 retrieving bean bag of floor with no reports of pain.    Other Therapeutic Activities  PT educated pt on breaking up activities to help decrease back pain. Suggested she keep a chair in kitchen to sit as needed when pain starts to increase. With dishes at sink suggested to keep knees bent slightly to keep back in a better position.      Exercises   Exercises  Other Exercises    Other Exercises   Seated bilateral scapular retraction with verbal and tactile cues for form. Pt performed in hooklying: transverse abdominus (TA) contraction x 6 with verbal cues to breath and try to keep back flat,  TA contraction with bilateral bent knee fall outs x 10, TA with heel slides x 10 each side, TA with bridge x 5, TA with march x 5 with verbal cues to only move through comfortable range. Pt reported a little pain in right hip area with march but reported pain less overall after.             PT Education - 09/07/19 1116    Education Details  Added to core stab HEP    Person(s) Educated  Patient    Methods  Explanation;Demonstration;Handout    Comprehension  Verbalized understanding;Returned demonstration       PT Short Term Goals - 08/31/19 1026      PT SHORT TERM GOAL #1   Title  Patient will report no pain in her heel with sit to stand or walking to improve her ability to stand and walk at home    Baseline  Pt reports that she is still getting pain in heels when she gets up in morning especially    Time  3    Period  Weeks    Status  Not Met    Target Date  08/28/19      PT SHORT TERM GOAL #2   Title  Patient will demo  100% lumbar ROM with <5/10 pain to improve ability to bend and twist with cleaning, cooking, ADLs.    Baseline  Lumbar extension limited about 30% with 9/10 pain,  full flexion but pain 9/10    Time  3    Period  Weeks    Status  Not Met    Target Date  08/28/19        PT Long Term Goals - 08/17/19 0272      PT LONG TERM GOAL #1   Title  Pt will report no greater than 5/10 pain with her ADLs to improve her function at home.    Baseline  >8/10    Time  5    Period  Weeks    Status  New      PT LONG TERM GOAL #2   Title  Pt will demo compliance with HEP and activity modification with ADLs to improve self management of her symptoms.    Time  5    Period  Weeks    Status  New      PT LONG TERM GOAL #3   Title  Patient will demo 5/5 strength with bil LE to improve functional strength with transfers.    Baseline  4/5 in hips grossly    Time  5    Period  Weeks    Status  New            Plan - 09/07/19 1116    Clinical Impression Statement  PT focused on body mechanic training with household tasks to help patient protect back. She did well but did display weakness in hips with activities. Continued to focus more on core stabilization which she responded well to with no increase in pain. Focused on quality versus quanity of movement.    Examination-Activity Limitations  Caring for Others;Carry;Lift;Squat;Stand;Transfers    Examination-Participation Restrictions  Cleaning;Community Activity;Laundry;Meal Prep;Shop;Yard Work    Stability/Clinical Decision Making  Evolving/Moderate complexity    Rehab Potential  Good    PT Frequency  2x / week    PT Duration  6 weeks    PT Treatment/Interventions  ADLs/Self Care Home Management;Therapeutic activities;Therapeutic exercise;Balance training;Neuromuscular re-education;Patient/family education;Manual techniques;Passive range of motion;Energy conservation;Joint Manipulations;Vestibular    PT Next Visit Plan  How is HEP going?   Progress  core stabilization as able.    Consulted and Agree with Plan of Care  Patient       Patient will benefit from skilled therapeutic intervention in order to improve the following deficits and impairments:  Decreased strength, Hypomobility, Difficulty walking, Increased muscle spasms, Impaired flexibility, Impaired sensation, Pain  Visit Diagnosis: Chronic low back pain, unspecified back pain laterality, unspecified whether sciatica present  Muscle weakness (generalized)     Problem List Patient Active Problem List   Diagnosis Date Noted  . Diabetes mellitus with proteinuria ([place]) 08/20/2019  . History of Helicobacter pylori infection 08/08/2019  . Proteinuria 06/14/2019  . Elevated sed rate 05/23/2019  . Elevated C-reactive protein (CRP) 05/23/2019  . Diabetes mellitus without complication ([place]) 53/66/4403  . Dyslipidemia (high LDL; low HDL) 05/23/2019  . Type 2 diabetes mellitus with hyperglycemia, without long-term current use of insulin ([place]) 02/17/2019  . Hemoglobin A1C between 7% and 9% indicating borderline diabetic control 02/17/2019  . Breast cancer screening by mammogram 02/17/2019  . Frequent headaches 02/14/2019  . Anxiety 02/14/2019  . Common migraine with intractable migraine 06/28/2017  . Chest pain at rest 10/17/2016  . Sinus tachycardia 08/18/2016  . Prolonged Q-T interval on ECG 08/18/2016  . Vertigo 07/22/2015  . Abdominal discomfort 02/14/2015  . Low back pain 02/14/2015  . Abdominal pain 02/01/2015  . Nausea 02/01/2015  .  Environmental allergies 02/01/2015  . Language barrier to communication 02/01/2015  . Anemia 01/23/2015  . Constipation 01/21/2015  . Knee pain, bilateral 01/21/2015  . Palpitations 09/18/2013  . Shortness of breath 09/18/2013  . Advanced maternal age in pregnancy in second trimester 06/19/2013  . IBS (irritable bowel syndrome) 04/13/2003    Electa Sniff, PT, DPT, NCS 09/07/2019, 11:18 AM  [facility] [address] [place], [place], [address] Phone: [phone]   Fax:  [phone]  Name: Shameka Aggarwal MRN: [id number] Date of Birth: 16-Oct-1975

## 2019-09-11 ENCOUNTER — Ambulatory Visit: Payer: Self-pay

## 2019-09-11 ENCOUNTER — Telehealth: Payer: Self-pay | Admitting: Clinical

## 2019-09-11 MED FILL — AMITIZA 8 MCG CAPSULE: 8 | 15 days supply | Qty: 30 | Fill #0

## 2019-09-11 NOTE — Telephone Encounter (Signed)
[facility] General Follow Up Note  09/11/2019 Name: Betty Cain MRN: [id number] DOB: [birth year] [name] is a 44 y.o. year old female who sees Vevelyn Francois, NP for primary care. LCSW was initially consulted to assess patient's needs and assist the patient with financial difficulties related to lack of health insurance coverage.  Interpreter: Yes.   Interpreter Name & Language: Pope ID [phone] - Arabic  Ongoing Intervention: Patient experiencing financial constraints related to lack of health insurance coverage and has several health concerns. CSW coordinated with [employer] [facility] to have patient scheduled for [employer] with their Development worker, community. Today CSW called patient with interpreter and advised patient of the appointment. Also advised patient to confirm with Medicaid that she does not have a pending adult Medicaid application. Patient reported she was told by Medicaid on the phone in January that she would not be eligible for adult Medicaid. She only has family planning Medicaid. Patient has not yet received the [employer] application CSW mailed last week. Advised patient to call if she does not receive it after a few days. Reminded patient to bring the completed application and supporting documents to the appointment at [facility].  Review of patient status, including review of consultants reports, relevant laboratory and other test results, and collaboration with appropriate care team members and the patient's provider was performed as part of comprehensive patient evaluation and provision of services.     Follow up Plan: 1. Available from clinic as needed  Estanislado Emms, [facility] [phone]

## 2019-09-12 ENCOUNTER — Encounter: Payer: Self-pay | Admitting: Neurology

## 2019-09-13 ENCOUNTER — Other Ambulatory Visit: Payer: Self-pay

## 2019-09-13 ENCOUNTER — Ambulatory Visit (INDEPENDENT_AMBULATORY_CARE_PROVIDER_SITE_OTHER): Payer: Self-pay | Admitting: Nurse Practitioner

## 2019-09-13 ENCOUNTER — Ambulatory Visit: Payer: Medicaid Other | Admitting: Nurse Practitioner

## 2019-09-13 ENCOUNTER — Telehealth: Payer: Self-pay | Admitting: Neurology

## 2019-09-13 ENCOUNTER — Encounter: Payer: Self-pay | Admitting: Nurse Practitioner

## 2019-09-13 VITALS — BP 125/87 | HR 113 | Temp 98.1°F | Ht <= 58 in | Wt 157.6 lb

## 2019-09-13 DIAGNOSIS — E119 Type 2 diabetes mellitus without complications: Secondary | ICD-10-CM

## 2019-09-13 DIAGNOSIS — R079 Chest pain, unspecified: Secondary | ICD-10-CM

## 2019-09-13 DIAGNOSIS — R202 Paresthesia of skin: Secondary | ICD-10-CM

## 2019-09-13 DIAGNOSIS — E1140 Type 2 diabetes mellitus with diabetic neuropathy, unspecified: Secondary | ICD-10-CM

## 2019-09-13 DIAGNOSIS — F419 Anxiety disorder, unspecified: Secondary | ICD-10-CM

## 2019-09-13 DIAGNOSIS — R5383 Other fatigue: Secondary | ICD-10-CM

## 2019-09-13 LAB — GLUCOSE, POCT (MANUAL RESULT ENTRY): POC Glucose: 290 mg/dl — AB (ref 70–99)

## 2019-09-13 MED ORDER — VITAMIN B-12 1000 MCG PO TABS: 1000.0000 ug | ORAL_TABLET | Freq: Every day | ORAL | 0 refills | Status: AC

## 2019-09-13 NOTE — Telephone Encounter (Signed)
Pt husband called to request a CB to discuss some headache issues pt has been having. States they will be out of the country for two months (July/august) and were hoping to see provider before then

## 2019-09-13 NOTE — Patient Instructions (Addendum)
Paresthesia Paresthesia is an abnormal burning or prickling sensation. It is usually felt in the hands, arms, legs, or feet. However, it may occur in any part of the body. Usually, paresthesia is not painful. It may feel like:  Tingling or numbness.  Buzzing.  Itching. Paresthesia may occur without any clear cause, or it may be caused by:  Breathing too quickly (hyperventilation).  Pressure on a nerve.  An underlying medical condition.  Side effects of a medication.  Nutritional deficiencies.  Exposure to toxic chemicals. Most people experience temporary (transient) paresthesia at some time in their lives. For some people, it may be long-lasting (chronic) because of an underlying medical condition. If you have paresthesia that lasts a long time, you may need to be evaluated by your health care provider. Follow these instructions at home: Alcohol use   Do not drink alcohol if: ? Your health care provider tells you not to drink. ? You are pregnant, may be pregnant, or are planning to become pregnant.  If you drink alcohol: ? Limit how much you use to:  0-1 drink a day for women.  0-2 drinks a day for men. ? Be aware of how much alcohol is in your drink. In the [place] one drink equals one 12 oz bottle of beer (355 mL), one 5 oz glass of wine (148 mL), or one 1 oz glass of hard liquor (44 mL). Nutrition   Eat a healthy diet. This includes: ? Eating foods that are high in fiber, such as fresh fruits and vegetables, whole grains, and beans. ? Limiting foods that are high in fat and processed sugars, such as fried or sweet foods. General instructions  Take over-the-counter and prescription medicines only as told by your health care provider.  Do not use any products that contain nicotine or tobacco, such as cigarettes and e-cigarettes. These can keep blood from reaching damaged nerves. If you need help quitting, ask your health care provider.  If you have diabetes, work  closely with your health care provider to keep your blood sugar under control.  If you have numbness in your feet: ? Check every day for signs of injury or infection. Watch for redness, warmth, and swelling. ? Wear padded socks and comfortable shoes. These help protect your feet.  Keep all follow-up visits as told by your health care provider. This is important. Contact a health care provider if you:  Have paresthesia that gets worse or does not go away.  Have a burning or prickling feeling that gets worse when you walk.  Have pain, cramps, or dizziness.  Develop a rash. Get help right away if you:  Feel weak.  Have trouble walking or moving.  Have problems with speech, understanding, or vision.  Feel confused.  Cannot control your bladder or bowel movements.  Have numbness after an injury.  Develop new weakness in an arm or leg.  Faint. Summary  Paresthesia is an abnormal burning or prickling sensation that is usually felt in the hands, arms, legs, or feet. It may also occur in other parts of the body.  Paresthesia may occur without any clear cause, or it may be caused by breathing too quickly (hyperventilation), pressure on a nerve, an underlying medical condition, side effects of a medication, nutritional deficiencies, or exposure to toxic chemicals.  If you have paresthesia that lasts a long time, you may need to be evaluated by your health care provider. This information is not intended to replace advice given to you by   your health care provider. Make sure you discuss any questions you have with your health care provider. Document Revised: 04/24/2018 Document Reviewed: 04/07/2017 Elsevier Patient Education  [place].  Nonspecific Chest Pain Chest pain can be caused by many different conditions. Some causes of chest pain can be life-threatening. These will require treatment right away. Serious causes of chest pain include: Heart attack. A tear in the  body's main blood vessel. Redness and swelling (inflammation) around your heart. Blood clot in your lungs. Other causes of chest pain may not be so serious. These include: Heartburn. Anxiety or stress. Damage to bones or muscles in your chest. Lung infections. Chest pain can feel like: Pain or discomfort in your chest. Crushing, pressure, aching, or squeezing pain. Burning or tingling. Dull or sharp pain that is worse when you move, cough, or take a deep breath. Pain or discomfort that is also felt in your back, neck, jaw, shoulder, or arm, or pain that spreads to any of these areas. It is hard to know whether your pain is caused by something that is serious or something that is not so serious. So it is important to see your doctor right away if you have chest pain. Follow these instructions at home: Medicines Take over-the-counter and prescription medicines only as told by your doctor. If you were prescribed an antibiotic medicine, take it as told by your doctor. Do not stop taking the antibiotic even if you start to feel better. Lifestyle  Rest as told by your doctor. Do not use any products that contain nicotine or tobacco, such as cigarettes, e-cigarettes, and chewing tobacco. If you need help quitting, ask your doctor. Do not drink alcohol. Make lifestyle changes as told by your doctor. These may include: Getting regular exercise. Ask your doctor what activities are safe for you. Eating a heart-healthy diet. A diet and nutrition specialist (dietitian) can help you to learn healthy eating options. Staying at a healthy weight. Treating diabetes or high blood pressure, if needed. Lowering your stress. Activities such as yoga and relaxation techniques can help. General instructions Pay attention to any changes in your symptoms. Tell your doctor about them or any new symptoms. Avoid any activities that cause chest pain. Keep all follow-up visits as told by your doctor. This is  important. You may need more testing if your chest pain does not go away. Contact a doctor if: Your chest pain does not go away. You feel depressed. You have a fever. Get help right away if: Your chest pain is worse. You have a cough that gets worse, or you cough up blood. You have very bad (severe) pain in your belly (abdomen). You pass out (faint). You have either of these for no clear reason: Sudden chest discomfort. Sudden discomfort in your arms, back, neck, or jaw. You have shortness of breath at any time. You suddenly start to sweat, or your skin gets clammy. You feel sick to your stomach (nauseous). You throw up (vomit). You suddenly feel lightheaded or dizzy. You feel very weak or tired. Your heart starts to beat fast, or it feels like it is skipping beats. These symptoms may be an emergency. Do not wait to see if the symptoms will go away. Get medical help right away. Call your local emergency services (911 in the [place] Do not drive yourself to the hospital. Summary Chest pain can be caused by many different conditions. The cause may be serious and need treatment right away. If you have chest  pain, see your doctor right away. Follow your doctor's instructions for taking medicines and making lifestyle changes. Keep all follow-up visits as told by your doctor. This includes visits for any further testing if your chest pain does not go away. Be sure to know the signs that show that your condition has become worse. Get help right away if you have these symptoms. This information is not intended to replace advice given to you by your health care provider. Make sure you discuss any questions you have with your health care provider. Document Revised: 09/29/2017 Document Reviewed: 09/29/2017 Elsevier Patient Education  2020 [employer].

## 2019-09-13 NOTE — Telephone Encounter (Signed)
I spoke to husband and made appt for wife on 09-17-19 at 1300, (SS.NP no availability).

## 2019-09-13 NOTE — Progress Notes (Signed)
[place], [place]  [address] Phone:  [phone]   Fax:  [phone]    Established Patient Office Visit  Subjective:  Patient ID: Betty Cain, female    DOB: 04-07-1976  Age: 44 y.o. MRN: [id number]  CC:  Chief Complaint  Patient presents with  . Follow-up    home cbg low and bp low  . Numbness    in left hand & both legs    HPI Betty Cain presents for numbness, She  has a past medical history of Allergy, Anemia, Anxiety (01/2019), Asthma, Common migraine with intractable migraine (06/28/2017), Diabetes ([place]) (02/2019), GERD (gastroesophageal reflux disease), Hypertension, IBS (irritable bowel syndrome) 03-Jun-2003), Neonatal death, Palpitations, Shortness of breath, Spinal headache, Valvular heart disease, and Vitamin D deficiency (02/2019).   Chest Pain Betty Cain complains of chest pain. Onset was 1 day ago. Symptoms have improved since that time. The patient's pain is intermittent. The patient describes the pain as unable to describe and does not radiate. Patient rates pain as a 8/10 in intensity. Associated symptoms are: fatigue and increase heart rate. Aggravating factors are: none. Alleviating factors are: rest. Patient's cardiac risk factors are: diabetes mellitus, dyslipidemia, obesity (BMI >= 30 kg/m2) and sedentary lifestyle. Patient's risk factors for DVT/PE: none. Previous cardiac testing: chest x-ray, cholesterol, coronary angiography, date 2016/06/02, location chest, echocardiogram, electrocardiogram (ECG), exercise stress test , HDL, kidney function, LDL, potassium, thyroid function, triglycerides and urinalysi She has numbness and tingling . She has noticed her "veins are white".  She had a episode at home at that time her CBG and BP; last week. She is having pain with walking.    Past Medical History:  Diagnosis Date  . Allergy   . Anemia   . Anxiety 01/2019  . Asthma   . Common migraine with intractable migraine 06/28/2017  .  Diabetes ([place]) 02/2019  . GERD (gastroesophageal reflux disease)    pos H pylori  . Hypertension    controlled with medication  . IBS (irritable bowel syndrome) 2003-06-03  . Neonatal death    Vaginal delivery, full term-lived x2 hours.   . Palpitations   . Shortness of breath   . Spinal headache   . Valvular heart disease   . Vitamin D deficiency 02/2019    Past Surgical History:  Procedure Laterality Date  . ADENOIDECTOMY     and tonsils (as a child)  . boil  Jun 02, 2001   right elbow  . CESAREAN SECTION     x4  . CESAREAN SECTION  06/02/2011   Procedure: CESAREAN SECTION;  Surgeon: [name], MD;  Location: [facility];  Service: Gynecology;  Laterality: N/A;  Primary Cesarean Section Delivery Baby Boy @ 0004, Apgars 9/9  . CESAREAN SECTION N/A 12/10/2013   Procedure: REPEAT CESAREAN SECTION;  Surgeon: [name], MD;  Location: [facility];  Service: Obstetrics;  Laterality: N/A;  . colonoscopy  11/23/2018   stated had issues with sleep when in for procedure  . OTHER SURGICAL HISTORY  Jun 03, 2003   Uterine surgery     Family History  Problem Relation Age of Onset  . Diabetes Mother   . Diabetes Father   . Diabetes Sister   . Anesthesia problems Neg Hx   . Colon cancer Neg Hx   . Esophageal cancer Neg Hx   . Rectal cancer Neg Hx   . Stomach cancer Neg Hx   . Colon polyps Neg Hx  Social History   Socioeconomic History  . Marital status: Married    Spouse name: [name]  . Number of children: 4  . Years of education: Not on file  . Highest education level: Not on file  Occupational History  . Occupation: Unemployed  Tobacco Use  . Smoking status: Never Smoker  . Smokeless tobacco: Never Used  Substance and Sexual Activity  . Alcohol use: No  . Drug use: No  . Sexual activity: Yes    Birth control/protection: None, Implant    Comment: pregnant  Other Topics Concern  . Not on file  Social History Narrative   Lives with husband and child   Caffeine use: daily (tea),  sometimes coffee/soda   Right handed    Social Determinants of Health   Financial Resource Strain:   . Difficulty of Paying Living Expenses:   Food Insecurity:   . Worried About Charity fundraiser in the Last Year:   . Arboriculturist in the Last Year:   Transportation Needs:   . Film/video editor (Medical):   Marland Kitchen Lack of Transportation (Non-Medical):   Physical Activity:   . Days of Exercise per Week:   . Minutes of Exercise per Session:   Stress:   . Feeling of Stress :   Social Connections:   . Frequency of Communication with Friends and Family:   . Frequency of Social Gatherings with Friends and Family:   . Attends Religious Services:   . Active Member of Clubs or Organizations:   . Attends Archivist Meetings:   Marland Kitchen Marital Status:   Intimate Partner Violence:   . Fear of Current or Ex-Partner:   . Emotionally Abused:   Marland Kitchen Physically Abused:   . Sexually Abused:     Outpatient Medications Prior to Visit  Medication Sig Dispense Refill  . acetaminophen (TYLENOL) 500 MG tablet Take 500 mg by mouth every 6 (six) hours as needed.    . blood glucose meter kit and supplies Dispense based on patient and insurance preference. Use up to four times daily as directed. (FOR ICD-10 E10.9, E11.9). 1 each 0  . Blood Pressure Monitoring DEVI 1 each by Does not apply route daily. 1 Device 0  . BREO ELLIPTA 100-25 MCG/INH AEPB INHALE 1 PUFF INTO THE LUNGS DAILY. 60 each 3  . dicyclomine (BENTYL) 10 MG/5ML solution Take by mouth 4 (four) times daily -  before meals and at bedtime.    [name] (AIMOVIG) 140 MG/ML SOAJ Inject 1 mg into the skin every 30 (thirty) days. 1.12 mg 11  . etonogestrel (NEXPLANON) 68 MG IMPL implant 1 each by Subdermal route once.    . FEROSUL 325 (65 Fe) MG tablet TAKE 1 TABLET (325 MG TOTAL) BY MOUTH DAILY. 30 tablet 3  . fluticasone (FLONASE) 50 MCG/ACT nasal spray PLACE 2 SPRAYS INTO BOTH NOSTRILS DAILY. 16 g 6  . gabapentin (NEURONTIN) 300  MG capsule Take 1 capsule (300 mg total) by mouth 3 (three) times daily. Take 2 capsules in the evening 90 capsule 2  . glipiZIDE (GLUCOTROL) 10 MG tablet TAKE 1 TABLET (10 MG TOTAL) BY MOUTH 2 (TWO) TIMES DAILY BEFORE A MEAL. (Patient taking differently: Take 30 mg by mouth daily before breakfast. ) 60 tablet 3  . glucose blood (FREESTYLE LITE) test strip Use as instructed 100 each 12  . hydrocortisone (ANUSOL-HC) 2.5 % rectal cream Place 1 application rectally 2 (two) times daily. 30 g 1  . levocetirizine (  XYZAL) 5 MG tablet Take 5 mg by mouth every evening.    . linaclotide (LINZESS) 72 MCG capsule Take 1 capsule (72 mcg total) by mouth daily before breakfast. 30 capsule 2  . metFORMIN (GLUCOPHAGE) 500 MG tablet Take 1 tablet (500 mg total) by mouth 2 (two) times daily with a meal. 180 tablet 3  . methylcellulose (CITRUCEL) oral powder Take as directed, daily    . metoprolol succinate (TOPROL XL) 100 MG 24 hr tablet Take 1 tablet (100 mg total) by mouth daily. Take with or immediately following a meal. 90 tablet 3  . nortriptyline (PAMELOR) 25 MG capsule Take 25 mg by mouth at bedtime.    . ondansetron (ZOFRAN-ODT) 8 MG disintegrating tablet TAKE 1 TABLET BY MOUTH EVERY 8 HOURS AS NEEDED FOR NAUSEA OR VOMITING. 30 tablet 0  . pantoprazole (PROTONIX) 40 MG tablet TAKE 1 TABLET BY MOUTH 2 TIMES DAILY BEFORE A MEAL. 60 tablet 1  . potassium chloride 20 MEQ/15ML (10%) SOLN Take 15 mLs (20 mEq total) by mouth daily. 473 mL 2  . rizatriptan (MAXALT) 10 MG tablet Take 1 tablet (10 mg total) by mouth 3 (three) times daily as needed for migraine. May repeat in 2 hours if needed 10 tablet 5  . rosuvastatin (CRESTOR) 5 MG tablet Take 1 tablet (5 mg total) by mouth at bedtime. 30 tablet 11  . TRUEplus Lancets 28G MISC USE AS DIRECTED UP TO 4 TIMES DAILY 100 each 0  . VENTOLIN HFA 108 (90 Base) MCG/ACT inhaler INHALE 2 PUFFS INTO THE LUNGS EVERY 6 (SIX) HOURS AS NEEDED FOR WHEEZING OR SHORTNESS OF BREATH. 18 g  2  . Vitamin D, Ergocalciferol, (DRISDOL) 1.25 MG (50000 UT) CAPS capsule Take 1 capsule (50,000 Units total) by mouth every 7 (seven) days. 5 capsule 6  . hydrOXYzine (ATARAX/VISTARIL) 10 MG tablet Take 1 tablet (10 mg total) by mouth 3 (three) times daily as needed. 30 tablet 2  . Multiple Vitamin (MULTI VITAMIN DAILY PO) Take by mouth.    . lubiprostone (AMITIZA) 8 MCG capsule Take 1 capsule (8 mcg total) by mouth 2 (two) times daily with a meal. 60 capsule 2  . norgestimate-ethinyl estradiol (SPRINTEC 28) 0.25-35 MG-MCG tablet Take 1 tablet by mouth daily. (Patient not taking: Reported on 09/13/2019) 1 Package 11   No facility-administered medications prior to visit.    Allergies  Allergen Reactions  . Topamax [Topiramate] Itching and Rash    ROS Review of Systems  Cardiovascular: Positive for chest pain.       Last on yesterday but noticed one week  8/10 Unable to describe Generalized Wax and wean No energy Eat but no appetite Denies any chest pain with meals Nausea occa no vomiting Rapid heartrate No swelling   Skin:       She admits that she had pallor to hand long time ago.  Neurological: Positive for numbness.      Objective:    Physical Exam  Constitutional: She is oriented to person, place, and time. She appears well-developed and well-nourished. No distress.  HENT:  Head: Normocephalic and atraumatic.  Cardiovascular: Normal rate, regular rhythm, normal heart sounds and intact distal pulses.  Pulmonary/Chest: Effort normal and breath sounds normal.  Abdominal: Soft. Bowel sounds are normal.  Musculoskeletal:        General: Normal range of motion.     Cervical back: Normal range of motion.  Neurological: She is alert and oriented to person, place, and time.  Skin:  Skin is warm and dry. She is not diaphoretic.  Psychiatric: She has a normal mood and affect. Her behavior is normal. Judgment and thought content normal.    BP 125/87   Pulse (!) 113   Temp 98.1  F (36.7 C)   Ht _0  (1.473 m)   Wt 157 lb 9.6 oz (71.5 kg)   SpO2 97%   BMI 32.94 kg/m  Wt Readings from Last 3 Encounters:  09/13/19 157 lb 9.6 oz (71.5 kg)  08/31/19 157 lb (71.2 kg)  08/20/19 158 lb (71.7 kg)     Health Maintenance Due  Topic Date Due  . PNEUMOCOCCAL POLYSACCHARIDE VACCINE AGE 77-64 HIGH RISK  Never done  . FOOT EXAM  Never done  . OPHTHALMOLOGY EXAM  Never done  . PAP SMEAR-Modifier  06/19/2016    There are no preventive care reminders to display for this patient.  Lab Results  Component Value Date   TSH 2.230 11/13/2018   Lab Results  Component Value Date   WBC 9.5 06/14/2019   HGB 10.6 (L) 06/14/2019   HCT 34.3 06/14/2019   MCV 80 06/14/2019   PLT 438 06/14/2019   Lab Results  Component Value Date   NA 137 08/20/2019   K 4.5 08/20/2019   CO2 21 05/21/2019   GLUCOSE 182 (H) 08/20/2019   BUN 8 08/20/2019   CREATININE 0.68 08/20/2019   BILITOT <0.2 08/20/2019   ALKPHOS 85 08/20/2019   AST 11 08/20/2019   ALT 14 05/21/2019   PROT 7.6 08/20/2019   ALBUMIN 4.3 08/20/2019   CALCIUM 9.6 08/20/2019   ANIONGAP 11 08/30/2018   GFR 119.42 08/28/2018   Lab Results  Component Value Date   CHOL 135 08/20/2019   Lab Results  Component Value Date   HDL 37 (L) 08/20/2019   Lab Results  Component Value Date   LDLCALC 68 08/20/2019   Lab Results  Component Value Date   TRIG 175 (H) 08/20/2019   Lab Results  Component Value Date   CHOLHDL 3.6 08/20/2019   Lab Results  Component Value Date   HGBA1C 6.8 (A) 08/20/2019   HGBA1C 6.8 08/20/2019   HGBA1C 6.8 (A) 08/20/2019   HGBA1C 6.8 08/20/2019      Assessment & Plan:   Problem List Items Addressed This Visit      Endocrine   Diabetes mellitus without complication ([place]) - Primary     Other   Anxiety   Relevant Medications   nortriptyline (PAMELOR) 25 MG capsule   Chest pain at rest    Other Visit Diagnoses    Controlled type 2 diabetes with neuropathy ([place])        Relevant Orders   POCT glucose (manual entry) (Completed)   Fatigue, unspecified type       Previous vitamin B12 level lower end of normal encourage daily vitamin B12 which may help with energy   Paresthesia       unchanged      Meds ordered this encounter  Medications  . vitamin B-12 (CYANOCOBALAMIN) 1000 MCG tablet    Sig: Take 1 tablet (1,000 mcg total) by mouth daily.    Dispense:  90 tablet    Refill:  0    Do not place medication on "Automatic Refill". Fill one day early if pharmacy is closed on scheduled refill date.    Order Specific Question:   Supervising Provider    Answer:   Tresa Garter [[id number]]    Follow-up: Return for  Appointment As Scheduled.    Vevelyn Francois, NP

## 2019-09-14 ENCOUNTER — Ambulatory Visit: Payer: Self-pay | Attending: Neurology | Admitting: Rehabilitative and Restorative Service Providers"

## 2019-09-14 ENCOUNTER — Encounter: Payer: Self-pay | Admitting: Rehabilitative and Restorative Service Providers"

## 2019-09-14 DIAGNOSIS — M5432 Sciatica, left side: Secondary | ICD-10-CM

## 2019-09-14 DIAGNOSIS — R293 Abnormal posture: Secondary | ICD-10-CM | POA: Insufficient documentation

## 2019-09-14 DIAGNOSIS — M6281 Muscle weakness (generalized): Secondary | ICD-10-CM

## 2019-09-14 DIAGNOSIS — M545 Low back pain, unspecified: Secondary | ICD-10-CM

## 2019-09-14 DIAGNOSIS — M5431 Sciatica, right side: Secondary | ICD-10-CM

## 2019-09-14 DIAGNOSIS — G8929 Other chronic pain: Secondary | ICD-10-CM

## 2019-09-14 NOTE — Therapy (Signed)
[facility] [address] [place], [place], [address] Phone: [phone]   Fax:  [phone]   Physical Therapy Treatment  Patient Details  Name: Betty Cain MRN: [id number] Date of Birth: 1976/03/15 Referring Provider (PT): Butler Denmark, NP   Encounter Date: 09/14/2019  PT End of Session - 09/14/19 1303    Visit Number  8    Number of Visits  12    Date for PT Re-Evaluation  10/14/19    PT Start Time  1017    PT Stop Time  1101    PT Time Calculation (min)  44 min    Activity Tolerance  Patient tolerated treatment well    Behavior During Therapy  [facility] for tasks assessed/performed       Past Medical History:  Diagnosis Date  . Allergy   . Anemia   . Anxiety 01/2019  . Asthma   . Common migraine with intractable migraine 06/28/2017  . Diabetes ([place]) 02/2019  . GERD (gastroesophageal reflux disease)    pos H pylori  . Hypertension    controlled with medication  . IBS (irritable bowel syndrome) July 05, 2003  . Neonatal death    Vaginal delivery, full term-lived x2 hours.   . Palpitations   . Shortness of breath   . Spinal headache   . Valvular heart disease   . Vitamin D deficiency 02/2019    Past Surgical History:  Procedure Laterality Date  . ADENOIDECTOMY     and tonsils (as a child)  . boil  2001/07/04   right elbow  . CESAREAN SECTION     x4  . CESAREAN SECTION  06/02/2011   Procedure: CESAREAN SECTION;  Surgeon: [name], MD;  Location: [facility];  Service: Gynecology;  Laterality: N/A;  Primary Cesarean Section Delivery Baby Boy @ 0004, Apgars 9/9  . CESAREAN SECTION N/A 12/10/2013   Procedure: REPEAT CESAREAN SECTION;  Surgeon: [name], MD;  Location: [facility];  Service: Obstetrics;  Laterality: N/A;  . colonoscopy  11/23/2018   stated had issues with sleep when in for procedure  . OTHER SURGICAL HISTORY  07-05-03   Uterine surgery     There were no vitals filed for this visit.  Subjective Assessment  - 09/14/19 1018    Subjective  Reports changes made to exercise are going okay. I've tried them 3-4 times. I feel better after doing them.    Patient is accompained by:  Interpreter   Mervat, interpreter   Pertinent History  DM, vertigo, mirgraines    Limitations  Lifting;Standing;Walking;House hold activities    How long can you sit comfortably?  no issues    How long can you stand comfortably?  15-20 min    How long can you walk comfortably?  15-20 min    Diagnostic tests  04/28/18: Standing frontal, standing lateral, and standing spot lumbosacrallateral images were obtained. The there are 5 non-rib-bearing lumbartype vertebral bodies. There is slight lumbar dextroscoliosis. Nofracture or spondylolisthesis. The disc spaces appear normal. Noerosive change.    Patient Stated Goals  Be able to move better    Currently in Pain?  Yes    Pain Score  8     Pain Location  Leg    Pain Orientation  Right;Mid;Lower    Pain Descriptors / Indicators  Aching    Pain Type  Chronic pain    Pain Onset  More than a month ago    Pain Frequency  Intermittent  [facility] PT Assessment - 09/14/19 1035      Strength   Right Hip Flexion  4+/5    Right Hip Extension  4-/5    Right Hip ABduction  4/5    Right Hip ADduction  4/5    Left Hip Flexion  5/5    Left Hip Extension  4-/5    Left Hip ABduction  4+/5    Left Hip ADduction  4+/5    Right Knee Flexion  4+/5    Right Knee Extension  5/5    Left Knee Flexion  5/5    Left Knee Extension  5/5                    [facility] Adult PT Treatment/Exercise - 09/14/19 1044      Therapeutic Activites    Therapeutic Activities  Lifting    Lifting  Reviewed via demo and description proper lifting and squat techniques as covered at last session as patient reported only moderate compliance with these changes      Lumbar Exercises: Supine   Pelvic Tilt  10 reps   3 sec hold, 2 sets    Pelvic Tilt Limitations  cueing required for proper technique     Glut Set  10 reps;3 seconds    Glut Set Limitations  cueing required to maintian lumbar stabilization activation during glute set              PT Education - 09/14/19 1255    Education Details  reviewed LTG check findings from today, strongly encouraged consistent HEP compliance and use of modifications to ADLs/lifting    Person(s) Educated  Patient    Methods  Explanation;Demonstration    Comprehension  Verbalized understanding;Need further instruction       PT Short Term Goals - 08/31/19 1026      PT SHORT TERM GOAL #1   Title  Patient will report no pain in her heel with sit to stand or walking to improve her ability to stand and walk at home    Baseline  Pt reports that she is still getting pain in heels when she gets up in morning especially    Time  3    Period  Weeks    Status  Not Met    Target Date  08/28/19      PT SHORT TERM GOAL #2   Title  Patient will demo 100% lumbar ROM with <5/10 pain to improve ability to bend and twist with cleaning, cooking, ADLs.    Baseline  Lumbar extension limited about 30% with 9/10 pain, full flexion but pain 9/10    Time  3    Period  Weeks    Status  Not Met    Target Date  08/28/19        PT Long Term Goals - 09/14/19 1026      PT LONG TERM GOAL #1   Title  Pt will report no greater than 5/10 pain with her ADLs to improve her function at home.    Baseline  Baseline: >8/10; 09/14/19: >6/10    Time  5    Period  Weeks    Status  Revised   original LTG #1 not met; see revised LTG #1 in recert     PT LONG TERM GOAL #2   Title  Pt will demo compliance with HEP and activity modification with ADLs to improve self management of her symptoms.    Baseline  09/14/19: patient  reports sporatic compliance with HEP to date citing she has many things going on at home, states full understanding of exercises - no questions    Time  5    Period  Weeks    Status  On-going      PT LONG TERM GOAL #3   Title  Patient will demo 5/5  strength with bil LE to improve functional strength with transfers.    Baseline  Baseline: 4/5 in hips grossly; 09/14/19: see objective measurements, above    Time  5    Period  Weeks    Status  Revised   original LTG #3 not met; see revised LTG #3 in recert         PT Short Term Goals - 09/14/19 1644      PT SHORT TERM GOAL #1   Title  ALL STGs = LTGs         PT Long Term Goals - 09/14/19 1639      PT LONG TERM GOAL #1   Title  Pt will demonstrate ability to perform 5 repetitions of retrieving an object weighing >/= 5 pounds to and from the floor with proper use of mehcanics to demonstrate improvement in safety with ADLs. (ALL LTGs due 10/14/19)    Baseline  Baseline: painful and poor mehcanics utilized with this activity    Time  4    Period  Weeks    Status  New    Target Date  10/14/19      PT LONG TERM GOAL #2   Title  Pt will demo compliance with HEP and activity modification with ADLs to improve self management of her symptoms.    Time  4    Period  Weeks    Status  New      PT LONG TERM GOAL #3   Title  Patient will demo >/= 4+/5 strength with bilateral lower extremities improve functional strength with transfers.    Time  4    Period  Weeks    Status  New         Plan - 09/14/19 1635    Clinical Impression Statement  Today's skilled session focused on checking patient's LTGs. Patient has not met any of her 3 LTGs, but is demonstrating mild progress on LTG #3, which is related to lower extremity strength. In light of progress made to date, PT recommends a taper to 1x/wk for an additional 4 weeks as one of the largest barriers to progress at this time is HEP compliance with consistency in addition to consistent utilization of activity modification patient has been educated on. Therefore, PT has updated LTGs today in light of progress noted at today's check and plan moving forward. She will benefit from continued skilled PT with a shift towards a greater emphasis on  activity modification (mechanics for ADLs) and lumbar stabilization.    Examination-Activity Limitations  Caring for Others;Carry;Lift;Squat;Stand;Transfers    Examination-Participation Restrictions  Cleaning;Community Activity;Laundry;Meal Prep;Shop;Yard Work    Stability/Clinical Decision Making  Evolving/Moderate complexity    Rehab Potential  Good    PT Frequency  2x / week    PT Duration  6 weeks    PT Treatment/Interventions  ADLs/Self Care Home Management;Therapeutic activities;Therapeutic exercise;Balance training;Neuromuscular re-education;Patient/family education;Manual techniques;Passive range of motion;Energy conservation;Joint Manipulations;Vestibular    PT Next Visit Plan  how is HEP compliance going?, progress lumbar stabilization in primarily supported positions with additon(s) of hip strengthening as tolerated, activity modification/ADLs mechanics review - how is compliance  here?    Consulted and Agree with Plan of Care  Patient       Patient will benefit from skilled therapeutic intervention in order to improve the following deficits and impairments:  Decreased strength, Hypomobility, Difficulty walking, Increased muscle spasms, Impaired flexibility, Impaired sensation, Pain  Visit Diagnosis: Chronic low back pain, unspecified back pain laterality, unspecified whether sciatica present  Muscle weakness (generalized)  Sciatica, right side  Sciatica, left side     Problem List Patient Active Problem List   Diagnosis Date Noted  . Diabetes mellitus with proteinuria ([place]) 08/20/2019  . History of Helicobacter pylori infection 08/08/2019  . Proteinuria 06/14/2019  . Elevated sed rate 05/23/2019  . Elevated C-reactive protein (CRP) 05/23/2019  . Diabetes mellitus without complication ([place]) 00/76/2263  . Dyslipidemia (high LDL; low HDL) 05/23/2019  . Type 2 diabetes mellitus with hyperglycemia, without long-term current use of insulin ([place]) 02/17/2019  . Hemoglobin A1C  between 7% and 9% indicating borderline diabetic control 02/17/2019  . Breast cancer screening by mammogram 02/17/2019  . Frequent headaches 02/14/2019  . Anxiety 02/14/2019  . Common migraine with intractable migraine 06/28/2017  . Chest pain at rest 10/17/2016  . Sinus tachycardia 08/18/2016  . Prolonged Q-T interval on ECG 08/18/2016  . Vertigo 07/22/2015  . Abdominal discomfort 02/14/2015  . Low back pain 02/14/2015  . Abdominal pain 02/01/2015  . Nausea 02/01/2015  . Environmental allergies 02/01/2015  . Language barrier to communication 02/01/2015  . Anemia 01/23/2015  . Constipation 01/21/2015  . Knee pain, bilateral 01/21/2015  . Palpitations 09/18/2013  . Shortness of breath 09/18/2013  . Advanced maternal age in pregnancy in second trimester 06/19/2013  . IBS (irritable bowel syndrome) 04/13/2003    [place], PT, DPT  09/14/2019, 4:37 PM  [facility] [address] [place], [place], [address] Phone: [phone]   Fax:  [phone]  Name: Betty Cain MRN: [id number] Date of Birth: 1975/07/26

## 2019-09-17 ENCOUNTER — Ambulatory Visit: Payer: Medicaid Other | Admitting: Adult Health

## 2019-09-17 ENCOUNTER — Other Ambulatory Visit: Payer: Self-pay

## 2019-09-17 ENCOUNTER — Ambulatory Visit: Payer: Self-pay

## 2019-09-17 ENCOUNTER — Other Ambulatory Visit: Payer: Self-pay | Admitting: Adult Health

## 2019-09-17 VITALS — BP 125/88 | HR 90 | Wt 158.0 lb

## 2019-09-17 DIAGNOSIS — M6281 Muscle weakness (generalized): Secondary | ICD-10-CM

## 2019-09-17 DIAGNOSIS — G43019 Migraine without aura, intractable, without status migrainosus: Secondary | ICD-10-CM

## 2019-09-17 DIAGNOSIS — M545 Low back pain, unspecified: Secondary | ICD-10-CM

## 2019-09-17 MED ORDER — NURTEC 75 MG PO TBDP: ORAL_TABLET | ORAL | 1 refills | Status: DC

## 2019-09-17 MED ORDER — AJOVY 225 MG/1.5ML ~~LOC~~ SOAJ: 225.0000 mg | SUBCUTANEOUS | 5 refills | Status: DC

## 2019-09-17 NOTE — Patient Instructions (Signed)
Access Code: [email or web address] URL: [email or web address] Date: 09/17/2019 Prepared by: Cherly Anderson  Exercises Hooklying Single Knee to Chest - 2 x daily - 7 x weekly - 1 sets - 10 reps - 10 hold Supine Lower Trunk Rotation - 2 x daily - 7 x weekly - 1 sets - 10 reps Supine Piriformis Stretch with Foot on Ground - 2 x daily - 7 x weekly - 3 reps - 30 hold Supine Posterior Pelvic Tilt - 2 x daily - 7 x weekly - 1 sets - 10 reps Bent knee fallout - 1 x daily - 7 x weekly - 1 sets - 10 reps Seated Lower Limb Slump Neural Mobilization - 1 x daily - 7 x weekly - 2 sets - 5 reps Supine Heel Slide - 1 x daily - 7 x weekly - 1 sets - 10 reps Beginner Bridge - 1 x daily - 7 x weekly - 1 sets - 10 reps Seated Scapular Retraction - 1 x daily - 7 x weekly - 1 sets - 10 reps Quadruped Leg Lifts - 1 x daily - 7 x weekly - 1 sets - 10 reps

## 2019-09-17 NOTE — Therapy (Signed)
[facility] [address] [place], [place], [address] Phone: [phone]   Fax:  [phone]  Physical Therapy Treatment  Patient Details  Name: Betty Cain MRN: [id number] Date of Birth: 1975/10/10 Referring Provider (PT): Butler Denmark, NP   Encounter Date: 09/17/2019  PT End of Session - 09/17/19 0942    Visit Number  9    Number of Visits  12    Date for PT Re-Evaluation  10/14/19    PT Start Time  0940    PT Stop Time  1019    PT Time Calculation (min)  39 min    Activity Tolerance  Patient tolerated treatment well    Behavior During Therapy  [facility] for tasks assessed/performed       Past Medical History:  Diagnosis Date  . Allergy   . Anemia   . Anxiety 01/2019  . Asthma   . Common migraine with intractable migraine 06/28/2017  . Diabetes ([place]) 02/2019  . GERD (gastroesophageal reflux disease)    pos H pylori  . Hypertension    controlled with medication  . IBS (irritable bowel syndrome) 07-14-03  . Neonatal death    Vaginal delivery, full term-lived x2 hours.   . Palpitations   . Shortness of breath   . Spinal headache   . Valvular heart disease   . Vitamin D deficiency 02/2019    Past Surgical History:  Procedure Laterality Date  . ADENOIDECTOMY     and tonsils (as a child)  . boil  2001-07-13   right elbow  . CESAREAN SECTION     x4  . CESAREAN SECTION  06/02/2011   Procedure: CESAREAN SECTION;  Surgeon: [name], MD;  Location: [facility];  Service: Gynecology;  Laterality: N/A;  Primary Cesarean Section Delivery Baby Boy @ 0004, Apgars 9/9  . CESAREAN SECTION N/A 12/10/2013   Procedure: REPEAT CESAREAN SECTION;  Surgeon: [name], MD;  Location: [facility];  Service: Obstetrics;  Laterality: N/A;  . colonoscopy  11/23/2018   stated had issues with sleep when in for procedure  . OTHER SURGICAL HISTORY  2003/07/14   Uterine surgery     There were no vitals filed for this visit.  Subjective Assessment  - 09/17/19 0943    Subjective  Pt reports that pain that was on the right side is now more on the left side. Has been inconsistent in HEP performance.    Patient is accompained by:  Interpreter   Husam, interpreter   Pertinent History  DM, vertigo, mirgraines    Limitations  Lifting;Standing;Walking;House hold activities    How long can you sit comfortably?  no issues    How long can you stand comfortably?  15-20 min    How long can you walk comfortably?  15-20 min    Diagnostic tests  04/28/18: Standing frontal, standing lateral, and standing spot lumbosacrallateral images were obtained. The there are 5 non-rib-bearing lumbartype vertebral bodies. There is slight lumbar dextroscoliosis. Nofracture or spondylolisthesis. The disc spaces appear normal. Noerosive change.    Patient Stated Goals  Be able to move better    Currently in Pain?  Yes    Pain Score  8     Pain Location  Back    Pain Orientation  Left;Mid;Lower    Pain Descriptors / Indicators  Aching;Pressure    Pain Type  Chronic pain    Pain Onset  More than a month ago    Pain Frequency  Intermittent  [facility] Adult PT Treatment/Exercise - 09/17/19 0946      Exercises   Exercises  Other Exercises    Other Exercises   Hooklying TA contraction x 10 with 5 sec holds. Pt needed verbal and tactile cuing for form. Hooklying bent knee fall outs with red theraband with TA contraction x 10. Heel slides with TA contraction x 10 each leg, heel slides with TA x 10, marching with TA x 10. Quadruped alternating leg lifts x 5 each leg with tactile cues to keep pelvis level. Seated bilateral scapular retraction x 10.  Pt was given verbal cues for form. Pt also demonstrated how patient could place theraband in doorjam to perform at home. Pt reported some slight pain in back with hooklying exercises but none in quadruped. Pain down to 5/10 in back at end of session.             PT Education - 09/17/19  1014    Education Details  Added quadruped leg lifts with TA and red theraband for bent knee fall outs and scapular retraction       PT Short Term Goals - 09/14/19 1644      PT SHORT TERM GOAL #1   Title  ALL STGs = LTGs        PT Long Term Goals - 09/14/19 1639      PT LONG TERM GOAL #1   Title  Pt will demonstrate ability to perform 5 repetitions of retrieving an object weighing >/= 5 pounds to and from the floor with proper use of mehcanics to demonstrate improvement in safety with ADLs. (ALL LTGs due 10/14/19)    Baseline  Baseline: painful and poor mehcanics utilized with this activity    Time  4    Period  Weeks    Status  New    Target Date  10/14/19      PT LONG TERM GOAL #2   Title  Pt will demo compliance with HEP and activity modification with ADLs to improve self management of her symptoms.    Time  4    Period  Weeks    Status  New      PT LONG TERM GOAL #3   Title  Patient will demo >/= 4+/5 strength with bilateral lower extremities improve functional strength with transfers.    Time  4    Period  Weeks    Status  New            Plan - 09/17/19 1025    Clinical Impression Statement  Pt tolerated core stab exercises well with pain down to 5/10 at end of session. Pt did need verbal and tactile cues for form having to think a lot about bracing her tummy.    Examination-Activity Limitations  Caring for Others;Carry;Lift;Squat;Stand;Transfers    Examination-Participation Restrictions  Cleaning;Community Activity;Laundry;Meal Prep;Shop;Yard Work    Stability/Clinical Decision Making  Evolving/Moderate complexity    Rehab Potential  Good    PT Frequency  1x / week    PT Duration  4 weeks    PT Treatment/Interventions  ADLs/Self Care Home Management;Therapeutic activities;Therapeutic exercise;Balance training;Neuromuscular re-education;Patient/family education;Manual techniques;Passive range of motion;Energy conservation;Joint Manipulations;Vestibular    PT Next  Visit Plan  how is HEP compliance going?, progress lumbar stabilization in primarily supported positions with additon(s) of hip strengthening as tolerated, activity modification/ADLs mechanics review - how is compliance here?    Consulted and Agree with Plan of Care  Patient       Patient  will benefit from skilled therapeutic intervention in order to improve the following deficits and impairments:  Decreased strength, Hypomobility, Difficulty walking, Increased muscle spasms, Impaired flexibility, Impaired sensation, Pain  Visit Diagnosis: Muscle weakness (generalized)  Chronic low back pain, unspecified back pain laterality, unspecified whether sciatica present     Problem List Patient Active Problem List   Diagnosis Date Noted  . Diabetes mellitus with proteinuria ([place]) 08/20/2019  . History of Helicobacter pylori infection 08/08/2019  . Proteinuria 06/14/2019  . Elevated sed rate 05/23/2019  . Elevated C-reactive protein (CRP) 05/23/2019  . Diabetes mellitus without complication ([place]) 77/93/9030  . Dyslipidemia (high LDL; low HDL) 05/23/2019  . Type 2 diabetes mellitus with hyperglycemia, without long-term current use of insulin ([place]) 02/17/2019  . Hemoglobin A1C between 7% and 9% indicating borderline diabetic control 02/17/2019  . Breast cancer screening by mammogram 02/17/2019  . Frequent headaches 02/14/2019  . Anxiety 02/14/2019  . Common migraine with intractable migraine 06/28/2017  . Chest pain at rest 10/17/2016  . Sinus tachycardia 08/18/2016  . Prolonged Q-T interval on ECG 08/18/2016  . Vertigo 07/22/2015  . Abdominal discomfort 02/14/2015  . Low back pain 02/14/2015  . Abdominal pain 02/01/2015  . Nausea 02/01/2015  . Environmental allergies 02/01/2015  . Language barrier to communication 02/01/2015  . Anemia 01/23/2015  . Constipation 01/21/2015  . Knee pain, bilateral 01/21/2015  . Palpitations 09/18/2013  . Shortness of breath 09/18/2013  . Advanced  maternal age in pregnancy in second trimester 06/19/2013  . IBS (irritable bowel syndrome) 04/13/2003    Electa Sniff, PT, DPT, NCS 09/17/2019, 10:28 AM  [facility] [address] [place], [place], [address] Phone: [phone]   Fax:  [phone]  Name: Betty Cain MRN: [id number] Date of Birth: [birth year]

## 2019-09-17 NOTE — Progress Notes (Signed)
PATIENT: Betty Cain DOB: [birth year]  REASON FOR VISIT: follow up HISTORY FROM: patient, interpreter present  HISTORY OF PRESENT ILLNESS: Today 09/17/19:  Betty Cain is a 44 year old female with a history of daily headaches.  She returns today for follow-up.  She reports that her headaches have gotten worse.  Reports that she only has 10 headache free days a month.  Reports that her headaches can sometimes last 3 or 4 days.  Reports that Maxalt is no longer offering her any benefit.  Reports that her headaches usually start in the back of the head on the left side and radiates to the frontal region.  She denies photophobia and phonophobia.  But does report nausea.  She has remained on Aimovig, nortriptyline, gabapentin and Toprol  HISTORY 04/26/19  Betty Cain is a 44 year old Arabic female with history of daily headache.  She has had MRI of the brain that was normal.  She remains on Aimovig, gabapentin, nortriptyline, Toprol, and Maxalt as needed.  She indicates her headaches are doing much better, she reports 5 headaches a month, are less intense, will go away within 2 hours.  Maxalt is beneficial.  She says she had a sleep study that was normal.  She does continue to complain of low back pain and pain in her legs.  She denies any falls or changes to the balance.  She does report she has hematuria and high protein in her urine, otherwise denies bowel or bladder incontinence.  She presents today for follow-up accompanied by interpreter.  REVIEW OF SYSTEMS: Out of a complete 14 system review of symptoms, the patient complains only of the following symptoms, and all other reviewed systems are negative.  See HPI  ALLERGIES: Allergies  Allergen Reactions  . Topamax [Topiramate] Itching and Rash    HOME MEDICATIONS: Outpatient Medications Prior to Visit  Medication Sig Dispense Refill  . acetaminophen (TYLENOL) 500 MG tablet Take 500 mg by mouth every 6 (six) hours as needed.    . blood  glucose meter kit and supplies Dispense based on patient and insurance preference. Use up to four times daily as directed. (FOR ICD-10 E10.9, E11.9). 1 each 0  . Blood Pressure Monitoring DEVI 1 each by Does not apply route daily. 1 Device 0  . BREO ELLIPTA 100-25 MCG/INH AEPB INHALE 1 PUFF INTO THE LUNGS DAILY. 60 each 3  . dicyclomine (BENTYL) 10 MG/5ML solution Take by mouth 4 (four) times daily -  before meals and at bedtime.    Betty Cain (AIMOVIG) 140 MG/ML SOAJ Inject 1 mg into the skin every 30 (thirty) days. 1.12 mg 11  . etonogestrel (NEXPLANON) 68 MG IMPL implant 1 each by Subdermal route once.    . FEROSUL 325 (65 Fe) MG tablet TAKE 1 TABLET (325 MG TOTAL) BY MOUTH DAILY. 30 tablet 3  . fluticasone (FLONASE) 50 MCG/ACT nasal spray PLACE 2 SPRAYS INTO BOTH NOSTRILS DAILY. 16 g 6  . gabapentin (NEURONTIN) 300 MG capsule Take 1 capsule (300 mg total) by mouth 3 (three) times daily. Take 2 capsules in the evening 90 capsule 2  . glipiZIDE (GLUCOTROL) 10 MG tablet TAKE 1 TABLET (10 MG TOTAL) BY MOUTH 2 (TWO) TIMES DAILY BEFORE A MEAL. (Patient taking differently: Take 30 mg by mouth daily before breakfast. ) 60 tablet 3  . glucose blood (FREESTYLE LITE) test strip Use as instructed 100 each 12  . hydrocortisone (ANUSOL-HC) 2.5 % rectal cream Place 1 application rectally 2 (two) times daily. [place]  g 1  . hydrOXYzine (ATARAX/VISTARIL) 10 MG tablet Take 1 tablet (10 mg total) by mouth 3 (three) times daily as needed. 30 tablet 2  . levocetirizine (XYZAL) 5 MG tablet Take 5 mg by mouth every evening.    . linaclotide (LINZESS) 72 MCG capsule Take 1 capsule (72 mcg total) by mouth daily before breakfast. 30 capsule 2  . metFORMIN (GLUCOPHAGE) 500 MG tablet Take 1 tablet (500 mg total) by mouth 2 (two) times daily with a meal. 180 tablet 3  . methylcellulose (CITRUCEL) oral powder Take as directed, daily    . metoprolol succinate (TOPROL XL) 100 MG 24 hr tablet Take 1 tablet (100 mg total) by  mouth daily. Take with or immediately following a meal. 90 tablet 3  . Multiple Vitamin (MULTI VITAMIN DAILY PO) Take by mouth.    . nortriptyline (PAMELOR) 25 MG capsule Take 25 mg by mouth at bedtime.    . ondansetron (ZOFRAN-ODT) 8 MG disintegrating tablet TAKE 1 TABLET BY MOUTH EVERY 8 HOURS AS NEEDED FOR NAUSEA OR VOMITING. 30 tablet 0  . pantoprazole (PROTONIX) 40 MG tablet TAKE 1 TABLET BY MOUTH 2 TIMES DAILY BEFORE A MEAL. 60 tablet 1  . potassium chloride 20 MEQ/15ML (10%) SOLN Take 15 mLs (20 mEq total) by mouth daily. 473 mL 2  . rizatriptan (MAXALT) 10 MG tablet Take 1 tablet (10 mg total) by mouth 3 (three) times daily as needed for migraine. May repeat in 2 hours if needed 10 tablet 5  . rosuvastatin (CRESTOR) 5 MG tablet Take 1 tablet (5 mg total) by mouth at bedtime. 30 tablet 11  . TRUEplus Lancets 28G MISC USE AS DIRECTED UP TO 4 TIMES DAILY 100 each 0  . VENTOLIN HFA 108 (90 Base) MCG/ACT inhaler INHALE 2 PUFFS INTO THE LUNGS EVERY 6 (SIX) HOURS AS NEEDED FOR WHEEZING OR SHORTNESS OF BREATH. 18 g 2  . vitamin B-12 (CYANOCOBALAMIN) 1000 MCG tablet Take 1 tablet (1,000 mcg total) by mouth daily. 90 tablet 0  . Vitamin D, Ergocalciferol, (DRISDOL) 1.25 MG (50000 UT) CAPS capsule Take 1 capsule (50,000 Units total) by mouth every 7 (seven) days. 5 capsule 6   No facility-administered medications prior to visit.    PAST MEDICAL HISTORY: Past Medical History:  Diagnosis Date  . Allergy   . Anemia   . Anxiety 01/2019  . Asthma   . Common migraine with intractable migraine 06/28/2017  . Diabetes ([place]) 02/2019  . GERD (gastroesophageal reflux disease)    pos H pylori  . Hypertension    controlled with medication  . IBS (irritable bowel syndrome) 06-15-03  . Neonatal death    Vaginal delivery, full term-lived x2 hours.   . Palpitations   . Shortness of breath   . Spinal headache   . Valvular heart disease   . Vitamin D deficiency 02/2019    PAST SURGICAL HISTORY: Past  Surgical History:  Procedure Laterality Date  . ADENOIDECTOMY     and tonsils (as a child)  . boil  Jun 14, 2001   right elbow  . CESAREAN SECTION     x4  . CESAREAN SECTION  06/02/2011   Procedure: CESAREAN SECTION;  Surgeon: [name], MD;  Location: [facility];  Service: Gynecology;  Laterality: N/A;  Primary Cesarean Section Delivery Baby Boy @ 0004, Apgars 9/9  . CESAREAN SECTION N/A 12/10/2013   Procedure: REPEAT CESAREAN SECTION;  Surgeon: [name], MD;  Location: [facility];  Service: Obstetrics;  Laterality: N/A;  .  colonoscopy  11/23/2018   stated had issues with sleep when in for procedure  . OTHER SURGICAL HISTORY  2005   Uterine surgery     FAMILY HISTORY: Family History  Problem Relation Age of Onset  . Diabetes Mother   . Diabetes Father   . Diabetes Sister   . Anesthesia problems Neg Hx   . Colon cancer Neg Hx   . Esophageal cancer Neg Hx   . Rectal cancer Neg Hx   . Stomach cancer Neg Hx   . Colon polyps Neg Hx     SOCIAL HISTORY: Social History   Socioeconomic History  . Marital status: Married    Spouse name: [name]  . Number of children: 4  . Years of education: Not on file  . Highest education level: Not on file  Occupational History  . Occupation: Unemployed  Tobacco Use  . Smoking status: Never Smoker  . Smokeless tobacco: Never Used  Substance and Sexual Activity  . Alcohol use: No  . Drug use: No  . Sexual activity: Yes    Birth control/protection: None, Implant    Comment: pregnant  Other Topics Concern  . Not on file  Social History Narrative   Lives with husband and child   Caffeine use: daily (tea), sometimes coffee/soda   Right handed    Social Determinants of Health   Financial Resource Strain:   . Difficulty of Paying Living Expenses:   Food Insecurity:   . Worried About Charity fundraiser in the Last Year:   . Arboriculturist in the Last Year:   Transportation Needs:   . Film/video editor (Medical):   Marland Kitchen Lack of  Transportation (Non-Medical):   Physical Activity:   . Days of Exercise per Week:   . Minutes of Exercise per Session:   Stress:   . Feeling of Stress :   Social Connections:   . Frequency of Communication with Friends and Family:   . Frequency of Social Gatherings with Friends and Family:   . Attends Religious Services:   . Active Member of Clubs or Organizations:   . Attends Archivist Meetings:   Marland Kitchen Marital Status:   Intimate Partner Violence:   . Fear of Current or Ex-Partner:   . Emotionally Abused:   Marland Kitchen Physically Abused:   . Sexually Abused:       PHYSICAL EXAM  Vitals:   09/17/19 1304  BP: 125/88  Pulse: 90  Weight: 158 lb (71.7 kg)   Body mass index is 33.02 kg/m.  Generalized: Well developed, in no acute distress   Neurological examination  Mentation: Alert oriented to time, place, history taking. Follows all commands speech and language fluent Cranial nerve II-XII: Pupils were equal round reactive to light. Extraocular movements were full, visual field were full on confrontational test.  Head turning and shoulder shrug  were normal and symmetric. Motor: The motor testing reveals 5 over 5 strength of all 4 extremities. Good symmetric motor tone is noted throughout.  Sensory: Sensory testing is intact to soft touch on all 4 extremities. No evidence of extinction is noted.  Coordination: Cerebellar testing reveals good finger-nose-finger and heel-to-shin bilaterally.  Gait and station: Gait is normal.  Reflexes: Deep tendon reflexes are symmetric and normal bilaterally.   DIAGNOSTIC DATA (LABS, IMAGING, TESTING) - I reviewed patient records, labs, notes, testing and imaging myself where available.  Lab Results  Component Value Date   WBC 9.5 06/14/2019   HGB 10.6 (  L) 06/14/2019   HCT 34.3 06/14/2019   MCV 80 06/14/2019   PLT 438 06/14/2019      Component Value Date/Time   NA 137 08/20/2019 1001   K 4.5 08/20/2019 1001   CL 101 08/20/2019 1001    CO2 21 05/21/2019 0920   GLUCOSE 182 (H) 08/20/2019 1001   GLUCOSE 185 (H) 08/30/2018 1240   GLUCOSE 77 09/21/2013 1230   BUN 8 08/20/2019 1001   CREATININE 0.68 08/20/2019 1001   CREATININE 0.58 09/14/2016 0939   CALCIUM 9.6 08/20/2019 1001   PROT 7.6 08/20/2019 1001   ALBUMIN 4.3 08/20/2019 1001   AST 11 08/20/2019 1001   ALT 14 05/21/2019 0920   ALKPHOS 85 08/20/2019 1001   BILITOT <0.2 08/20/2019 1001   GFRNONAA 109 08/20/2019 1001   GFRNONAA >89 09/14/2016 0939   GFRAA 126 08/20/2019 1001   GFRAA >89 09/14/2016 0939   Lab Results  Component Value Date   CHOL 135 08/20/2019   HDL 37 (L) 08/20/2019   LDLCALC 68 08/20/2019   TRIG 175 (H) 08/20/2019   CHOLHDL 3.6 08/20/2019   Lab Results  Component Value Date   HGBA1C 6.8 (A) 08/20/2019   HGBA1C 6.8 08/20/2019   HGBA1C 6.8 (A) 08/20/2019   HGBA1C 6.8 08/20/2019   Lab Results  Component Value Date   VITAMINB12 308 02/14/2019   Lab Results  Component Value Date   TSH 2.230 11/13/2018      ASSESSMENT AND PLAN 44 y.o. year old female  has a past medical history of Allergy, Anemia, Anxiety (01/2019), Asthma, Common migraine with intractable migraine (06/28/2017), Diabetes ([place]) (02/2019), GERD (gastroesophageal reflux disease), Hypertension, IBS (irritable bowel syndrome) 06/11/2003), Neonatal death, Palpitations, Shortness of breath, Spinal headache, Valvular heart disease, and Vitamin D deficiency (02/2019). here with:  Migraine headaches   Continue nortriptyline, gabapentin and Toprol  Stop Maxalt and Aimovig  Start Ajovy as a preventative medication  Start Nurtec as an abortive medication  Medications reviewed with the patient  Advised if symptoms worsen or she develops new symptoms she should let us know  Follow-up in 6 months or sooner if needed   I spent 20 minutes of face-to-face and non-face-to-face time with patient.  This included previsit chart review, lab review, study review, order entry,  electronic health record documentation, patient education.  Ward Givens, MSN, NP-C 09/17/2019, 1:00 PM [facility] [address], [place], [place] [address] [phone]

## 2019-09-17 NOTE — Patient Instructions (Signed)
Your Plan:  Continue nortriptyline, gabapentin and toprol  Stop Maxalt  Stop Aimovig  Start Ajovy injection monthly for headache prevention  Start Nurtec: take at the onset of migraine for abortive therapy  If your symptoms worsen or you develop new symptoms please let us know.    Thank you for coming to see [place] at [facility]. I hope we have been able to provide you high quality care today.  You may receive a patient satisfaction survey over the next few weeks. We would appreciate your feedback and comments so that we may continue to improve ourselves and the health of our patients.

## 2019-09-17 NOTE — Progress Notes (Signed)
I have read the note, and I agree with the clinical assessment and plan.  Zerah Hilyer K Kyvon Hu   

## 2019-09-21 ENCOUNTER — Ambulatory Visit: Payer: Self-pay

## 2019-09-25 ENCOUNTER — Other Ambulatory Visit: Payer: Self-pay

## 2019-09-25 ENCOUNTER — Ambulatory Visit: Payer: Self-pay

## 2019-09-25 DIAGNOSIS — G8929 Other chronic pain: Secondary | ICD-10-CM

## 2019-09-25 DIAGNOSIS — M6281 Muscle weakness (generalized): Secondary | ICD-10-CM

## 2019-09-25 NOTE — Patient Instructions (Signed)
Access Code: [email or web address] URL: [email or web address] Date: 09/25/2019 Prepared by: Cherly Anderson  Exercises Hooklying Single Knee to Chest - 2 x daily - 7 x weekly - 1 sets - 10 reps - 10 hold Supine Lower Trunk Rotation - 2 x daily - 7 x weekly - 1 sets - 10 reps Supine Piriformis Stretch with Foot on Ground - 2 x daily - 7 x weekly - 3 reps - 30 hold Supine Posterior Pelvic Tilt - 2 x daily - 7 x weekly - 1 sets - 10 reps Bent knee fallout - 1 x daily - 7 x weekly - 1 sets - 10 reps Seated Lower Limb Slump Neural Mobilization - 1 x daily - 7 x weekly - 2 sets - 5 reps Supine Heel Slide - 1 x daily - 7 x weekly - 1 sets - 10 reps Beginner Bridge - 1 x daily - 7 x weekly - 1 sets - 10 reps Seated Scapular Retraction - 1 x daily - 7 x weekly - 1 sets - 10 reps Quadruped Leg Lifts - 1 x daily - 7 x weekly - 1 sets - 10 reps Supine March - 1 x daily - 7 x weekly - 1 sets - 10 reps

## 2019-09-25 NOTE — Therapy (Signed)
[facility] [address] [place], [place], [address] Phone: [phone]   Fax:  [phone]  Physical Therapy Treatment  Patient Details  Name: Betty Cain MRN: [id number] Date of Birth: 04/04/1976 Referring Provider (PT): Butler Denmark, NP   Encounter Date: 09/25/2019   PT End of Session - 09/25/19 1022    Visit Number 10    Number of Visits 12    Date for PT Re-Evaluation 10/14/19    PT Start Time 1016-07-13    PT Stop Time July 14, 1050   pt doing well with exercises so finished early   PT Time Calculation (min) 34 min    Activity Tolerance Patient tolerated treatment well    Behavior During Therapy [facility] for tasks assessed/performed           Past Medical History:  Diagnosis Date  . Allergy   . Anemia   . Anxiety 01/2019  . Asthma   . Common migraine with intractable migraine 06/28/2017  . Diabetes ([place]) 02/2019  . GERD (gastroesophageal reflux disease)    pos H pylori  . Hypertension    controlled with medication  . IBS (irritable bowel syndrome) 07-14-2003  . Neonatal death    Vaginal delivery, full term-lived x2 hours.   . Palpitations   . Shortness of breath   . Spinal headache   . Valvular heart disease   . Vitamin D deficiency 02/2019    Past Surgical History:  Procedure Laterality Date  . ADENOIDECTOMY     and tonsils (as a child)  . boil  07-13-01   right elbow  . CESAREAN SECTION     x4  . CESAREAN SECTION  06/02/2011   Procedure: CESAREAN SECTION;  Surgeon: [name], MD;  Location: [facility];  Service: Gynecology;  Laterality: N/A;  Primary Cesarean Section Delivery Baby Boy @ 0004, Apgars 9/9  . CESAREAN SECTION N/A 12/10/2013   Procedure: REPEAT CESAREAN SECTION;  Surgeon: [name], MD;  Location: [facility];  Service: Obstetrics;  Laterality: N/A;  . colonoscopy  11/23/2018   stated had issues with sleep when in for procedure  . OTHER SURGICAL HISTORY  07/14/03   Uterine surgery     There were no  vitals filed for this visit.   Subjective Assessment - 09/25/19 1021    Subjective Pt reports that her back has been doing much better than before.    Patient is accompained by: Interpreter   Reem, interpreter   Pertinent History DM, vertigo, mirgraines    Limitations Lifting;Standing;Walking;House hold activities    How long can you sit comfortably? no issues    How long can you stand comfortably? 15-20 min    How long can you walk comfortably? 15-20 min    Diagnostic tests 04/28/18: Standing frontal, standing lateral, and standing spot lumbosacrallateral images were obtained. The there are 5 non-rib-bearing lumbartype vertebral bodies. There is slight lumbar dextroscoliosis. Nofracture or spondylolisthesis. The disc spaces appear normal. Noerosive change.    Patient Stated Goals Be able to move better    Currently in Pain? No/denies   just some pressure between shoulder blades.   Pain Onset More than a month ago                             [facility] Adult PT Treatment/Exercise - 09/25/19 1026      Exercises   Exercises Other Exercises    Other Exercises  Hooklying TA  contraction with bilateral bent knee fall outs x 10 then added red theraband around thighs x 10. Verbal cues to keep tummy tight and breath throughout, hooklying march with red theraband around thighs x 10, bridges x 10, heel slides x 10 each leg. Pt was cued to maintain TA contraction throughout and not hold breath. Quadruped alternating leg lifts x 10 each with tactile and verbal cues to keep back flat and hips level then alternating arm lifts x 5 each. Standing bilateral scapular retraction with red theraband x 10 with tactile cues for form                  PT Education - 09/25/19 1056    Education Details Added hooklying march. Also instructed pt to add red theraband to march. Discussed possible early d/c next visit pending how she is doing.    Person(s) Educated Patient    Methods  Explanation;Demonstration;Handout    Comprehension Verbalized understanding;Returned demonstration            PT Short Term Goals - 09/14/19 1644      PT SHORT TERM GOAL #1   Title ALL STGs = LTGs             PT Long Term Goals - 09/14/19 1639      PT LONG TERM GOAL #1   Title Pt will demonstrate ability to perform 5 repetitions of retrieving an object weighing >/= 5 pounds to and from the floor with proper use of mehcanics to demonstrate improvement in safety with ADLs. (ALL LTGs due 10/14/19)    Baseline Baseline: painful and poor mehcanics utilized with this activity    Time 4    Period Weeks    Status New    Target Date 10/14/19      PT LONG TERM GOAL #2   Title Pt will demo compliance with HEP and activity modification with ADLs to improve self management of her symptoms.    Time 4    Period Weeks    Status New      PT LONG TERM GOAL #3   Title Patient will demo >/= 4+/5 strength with bilateral lower extremities improve functional strength with transfers.    Time 4    Period Weeks    Status New                 Plan - 09/25/19 1057    Clinical Impression Statement Pt not reporting any pain at visit today with core stabilization exercises and reports she has been working on at home. She could not think of any certain activities that aggravate her at home but does state that she stops if something does. Pt still needing some occasional cues for form with exercises.    Examination-Activity Limitations Caring for Others;Carry;Lift;Squat;Stand;Transfers    Examination-Participation Restrictions Cleaning;Community Activity;Laundry;Meal Prep;Shop;Yard Work    Stability/Clinical Decision Making Evolving/Moderate complexity    Rehab Potential Good    PT Frequency 1x / week    PT Duration 4 weeks    PT Treatment/Interventions ADLs/Self Care Home Management;Therapeutic activities;Therapeutic exercise;Balance training;Neuromuscular re-education;Patient/family  education;Manual techniques;Passive range of motion;Energy conservation;Joint Manipulations;Vestibular    PT Next Visit Plan Recheck goals early if pt doing well still. Finalize core stab HEP for possible early d/c if still doing well.    Consulted and Agree with Plan of Care Patient           Patient will benefit from skilled therapeutic intervention in order to improve the following deficits  and impairments:  Decreased strength, Hypomobility, Difficulty walking, Increased muscle spasms, Impaired flexibility, Impaired sensation, Pain  Visit Diagnosis: Chronic low back pain, unspecified back pain laterality, unspecified whether sciatica present  Muscle weakness (generalized)     Problem List Patient Active Problem List   Diagnosis Date Noted  . Diabetes mellitus with proteinuria ([place]) 08/20/2019  . History of Helicobacter pylori infection 08/08/2019  . Proteinuria 06/14/2019  . Elevated sed rate 05/23/2019  . Elevated C-reactive protein (CRP) 05/23/2019  . Diabetes mellitus without complication ([place]) 56/38/7564  . Dyslipidemia (high LDL; low HDL) 05/23/2019  . Type 2 diabetes mellitus with hyperglycemia, without long-term current use of insulin ([place]) 02/17/2019  . Hemoglobin A1C between 7% and 9% indicating borderline diabetic control 02/17/2019  . Breast cancer screening by mammogram 02/17/2019  . Frequent headaches 02/14/2019  . Anxiety 02/14/2019  . Common migraine with intractable migraine 06/28/2017  . Chest pain at rest 10/17/2016  . Sinus tachycardia 08/18/2016  . Prolonged Q-T interval on ECG 08/18/2016  . Vertigo 07/22/2015  . Abdominal discomfort 02/14/2015  . Low back pain 02/14/2015  . Abdominal pain 02/01/2015  . Nausea 02/01/2015  . Environmental allergies 02/01/2015  . Language barrier to communication 02/01/2015  . Anemia 01/23/2015  . Constipation 01/21/2015  . Knee pain, bilateral 01/21/2015  . Palpitations 09/18/2013  . Shortness of breath 09/18/2013   . Advanced maternal age in pregnancy in second trimester 06/19/2013  . IBS (irritable bowel syndrome) 04/13/2003    Electa Sniff, PT, DPT, NCS 09/25/2019, 11:00 AM  [facility] [address] [place], [place], [address] Phone: [phone]   Fax:  [phone]  Name: Yannet Rincon MRN: [id number] Date of Birth: [birth year]

## 2019-09-26 ENCOUNTER — Telehealth: Payer: Self-pay | Admitting: Adult Health

## 2019-09-26 NOTE — Telephone Encounter (Signed)
Pt is asking for a call to discuss something being called in for her very bad headaches

## 2019-09-26 NOTE — Telephone Encounter (Signed)
I called pt and she is not able to get the [place] or AJOVY thru [facility].  She has no insurance.  She needs to have PAP fill out for both of these.  (she had aimovig with pap) per Claiborne Billings at [facility] and [facility].  She is taking tylenol every 4 hours, I relayed that she may be getting rebound headaches from taking this daily.  I told her to try to stop but will work on getting pap for the newer drugs.  I could give  Her samples of ajovy.  Dont have any nurtec.  Any other recs?

## 2019-09-26 NOTE — Telephone Encounter (Signed)
She has tried multiple Oral medication so Ajovy would be best option. Can give samples if we have them. She has maxalt? Is she using that.

## 2019-09-27 MED ORDER — RIZATRIPTAN BENZOATE 10 MG PO TABS: 10.0000 mg | ORAL_TABLET | Freq: Three times a day (TID) | ORAL | 3 refills | Status: DC | PRN

## 2019-09-27 MED ORDER — AJOVY 225 MG/1.5ML ~~LOC~~ SOAJ: 225.0000 mg | SUBCUTANEOUS | 0 refills | Status: DC

## 2019-09-27 MED FILL — RIZATRIPTAN BENZOATE 10 MG: 10 | 30 days supply | Qty: 10 | Fill #0

## 2019-09-27 NOTE — Addendum Note (Signed)
Addended by: Brandon Melnick on: 09/27/2019 03:40 PM   Modules accepted: Orders

## 2019-09-27 NOTE — Addendum Note (Signed)
Addended by: Brandon Melnick on: 09/27/2019 10:08 AM   Modules accepted: Orders

## 2019-09-27 NOTE — Telephone Encounter (Signed)
Pt here this afternoon.  I relayed instructions for PAP and she signed both forms for PAP AJOVY and NURTEC.  Faxed with fax confirmation received to both. [phone] fax, [phone].  Fax confirmation received for AJOVY [phone], ovf [id number].  I instructed about AJOVY keeping in fridge until used.  She is to use first one 30 days from last aimovig injection.  She will be going to [place], 10-14-19 thru 12-04-19 asked about keeping it in refrig if power goes out.  I relayed try putting in cooler with ice, not freezing it.  She verbalized understanding.  Interpreter Ahmed [phone] from language line.  I relayed to expect calls from ajovy and nurtec PAP programs to try to assist with getting her medication since no insurance.  She will pick up rizatriptan today a local pharmacy.

## 2019-09-27 NOTE — Telephone Encounter (Signed)
I called and spoke to pt via language line (arabic) Betty Cain [phone].  I explained that her pharmacy does not have these medications.  Since no insurance she can apply to PAP for assitance.  Forms completed and need her to sign.  She will come by for 2 samples of AJOVY.  Will need to give 30 days from last aimovig injection. She verbalized understanding.  I relayed that these are preventative, not for abortive therapy,  Nurtec is for this.  Will do same for PAP for nurtec.  Form completed and pt to sign.  She is to use rizatriptan for abortive therapy as previously ordered.  She needed new prescription.  [facility] She will come by to pick up samples and sign PAP.

## 2019-09-28 ENCOUNTER — Encounter: Payer: Self-pay | Admitting: Adult Health

## 2019-09-28 ENCOUNTER — Ambulatory Visit: Payer: Medicaid Other

## 2019-10-01 ENCOUNTER — Encounter: Payer: Self-pay | Admitting: Nurse Practitioner

## 2019-10-01 ENCOUNTER — Ambulatory Visit (INDEPENDENT_AMBULATORY_CARE_PROVIDER_SITE_OTHER): Payer: Self-pay | Admitting: Nurse Practitioner

## 2019-10-01 ENCOUNTER — Other Ambulatory Visit: Payer: Self-pay

## 2019-10-01 ENCOUNTER — Telehealth: Payer: Self-pay | Admitting: Adult Health

## 2019-10-01 ENCOUNTER — Other Ambulatory Visit: Payer: Self-pay | Admitting: Nurse Practitioner

## 2019-10-01 VITALS — BP 120/78 | HR 96 | Temp 98.5°F | Ht <= 58 in | Wt 159.0 lb

## 2019-10-01 DIAGNOSIS — E1140 Type 2 diabetes mellitus with diabetic neuropathy, unspecified: Secondary | ICD-10-CM

## 2019-10-01 DIAGNOSIS — E1165 Type 2 diabetes mellitus with hyperglycemia: Secondary | ICD-10-CM

## 2019-10-01 DIAGNOSIS — R000 Tachycardia, unspecified: Secondary | ICD-10-CM

## 2019-10-01 DIAGNOSIS — M5440 Lumbago with sciatica, unspecified side: Secondary | ICD-10-CM

## 2019-10-01 DIAGNOSIS — R202 Paresthesia of skin: Secondary | ICD-10-CM

## 2019-10-01 DIAGNOSIS — D649 Anemia, unspecified: Secondary | ICD-10-CM

## 2019-10-01 DIAGNOSIS — F419 Anxiety disorder, unspecified: Secondary | ICD-10-CM

## 2019-10-01 DIAGNOSIS — J4520 Mild intermittent asthma, uncomplicated: Secondary | ICD-10-CM

## 2019-10-01 DIAGNOSIS — R5383 Other fatigue: Secondary | ICD-10-CM

## 2019-10-01 LAB — POCT URINALYSIS DIPSTICK
Bilirubin, UA: NEGATIVE
Glucose, UA: NEGATIVE
Ketones, UA: NEGATIVE
Leukocytes, UA: NEGATIVE
Nitrite, UA: NEGATIVE
Protein, UA: POSITIVE — AB
Spec Grav, UA: 1.02 (ref 1.010–1.025)
Urobilinogen, UA: 0.2 E.U./dL
pH, UA: 6 (ref 5.0–8.0)

## 2019-10-01 MED ORDER — GLIPIZIDE 10 MG PO TABS: 10.0000 mg | ORAL_TABLET | Freq: Two times a day (BID) | ORAL | 0 refills | Status: DC

## 2019-10-01 MED ORDER — ALBUTEROL SULFATE HFA 108 (90 BASE) MCG/ACT IN AERS: INHALATION_SPRAY | RESPIRATORY_TRACT | 2 refills | Status: DC

## 2019-10-01 MED ORDER — METOPROLOL SUCCINATE ER 100 MG PO TB24: 100.0000 mg | ORAL_TABLET | Freq: Every day | ORAL | 3 refills | Status: DC

## 2019-10-01 MED ORDER — FERROUS SULFATE 325 (65 FE) MG PO TABS: ORAL_TABLET | ORAL | 1 refills | Status: DC

## 2019-10-01 MED ORDER — GABAPENTIN 300 MG PO CAPS: 300.0000 mg | ORAL_CAPSULE | Freq: Three times a day (TID) | ORAL | 0 refills | Status: DC

## 2019-10-01 MED ORDER — NORTRIPTYLINE HCL 25 MG PO CAPS: 25.0000 mg | ORAL_CAPSULE | Freq: Every day | ORAL | 0 refills | Status: DC

## 2019-10-01 MED ORDER — BREO ELLIPTA 100-25 MCG/INH IN AEPB: INHALATION_SPRAY | RESPIRATORY_TRACT | 3 refills | Status: DC

## 2019-10-01 MED ORDER — LEVOCETIRIZINE DIHYDROCHLORIDE 5 MG PO TABS: 5.0000 mg | ORAL_TABLET | Freq: Every evening | ORAL | 0 refills | Status: DC

## 2019-10-01 MED ORDER — DICYCLOMINE HCL 10 MG/5ML PO SOLN: 10.0000 mg | Freq: Three times a day (TID) | ORAL | 2 refills | Status: DC

## 2019-10-01 MED ORDER — METFORMIN HCL 500 MG PO TABS: 500.0000 mg | ORAL_TABLET | Freq: Two times a day (BID) | ORAL | 3 refills | Status: DC

## 2019-10-01 MED ORDER — CIPROFLOXACIN HCL 500 MG PO TABS: 500.0000 mg | ORAL_TABLET | Freq: Two times a day (BID) | ORAL | 0 refills | Status: AC

## 2019-10-01 MED ORDER — PANTOPRAZOLE SODIUM 40 MG PO TBEC: DELAYED_RELEASE_TABLET | ORAL | 1 refills | Status: DC

## 2019-10-01 MED ORDER — HYDROXYZINE HCL 10 MG PO TABS: 10.0000 mg | ORAL_TABLET | Freq: Three times a day (TID) | ORAL | 2 refills | Status: AC | PRN

## 2019-10-01 MED FILL — CIPROFLOXACIN HCL 500 MG TA: 500 | 5 days supply | Qty: 10 | Fill #0

## 2019-10-01 MED FILL — $BREO ELLIPTA 100-25 MCG IH: 100-25 MCG | 90 days supply | Qty: 180 | Fill #0

## 2019-10-01 MED FILL — DICYCLOMINE HCL 10 MG/5ML S: 10 | 23 days supply | Qty: 473 | Fill #0

## 2019-10-01 MED FILL — $VENTOLIN HFA 18G INHALER: 108 (90 BAS | 75 days supply | Qty: 54 | Fill #0

## 2019-10-01 NOTE — Telephone Encounter (Signed)
Betty Cain with [facility] patient assistance called stating they need a correction on the [employer]. It needs to say 8 tablets for 30 days. Their phone number is [phone] & fax # [phone]. She stated it needs to be corrected on the patient assistance form.

## 2019-10-01 NOTE — Telephone Encounter (Signed)
Changed the prescription to 8 tabs/30 days.  refaxed back to # listed with fax confirmation received.

## 2019-10-01 NOTE — Progress Notes (Signed)
[place], [place]  [address] Phone:  [phone]   Fax:  [phone]   Established Patient Office Visit  Subjective:  Patient ID: Betty Cain, female    DOB: Mar 12, 1976  Age: 44 y.o. MRN: [id number]  CC:  Chief Complaint  Patient presents with  . Back Pain    back pain , and in right side  started 2 days ago    HPI Elzabeth Ihde presents for back pain. We have interpreter Dalya [id number].  She  has a past medical history of Allergy, Anemia, Anxiety (01/2019), Asthma, Common migraine with intractable migraine (06/28/2017), Diabetes ([place]) (02/2019), GERD (gastroesophageal reflux disease), Hypertension, IBS (irritable bowel syndrome) 06/17/2003), Neonatal death, Palpitations, Shortness of breath, Spinal headache, Valvular heart disease, and Vitamin D deficiency (02/2019).   Back Pain Patient presents for evaluation of low back problems. Symptoms have been present for 2 days and include pain in Back (aching in character; varies/10 in severity). Initial inciting event: none. Symptoms are worse Intermittent. She is having right sided abdominal.   Alleviating factors identifiable by the patient are none. Aggravating factors identifiable by the patient are Any activity. Treatments initiated by the patient: Tylenol and gabapentin. Previous lower back problems: Approximately 18 months ago. Previous work up: Publishing rights manager. Previous treatments: none.   Diabetes Mellitus Patient presents for follow up of diabetes. Current symptoms include: hyperglycemia and hypoglycemia . Symptoms have stabilized. Patient denies foot ulcerations, increased appetite, polydipsia, polyuria, visual disturbances, vomiting and weight loss. Evaluation to date has included: fasting blood sugar, fasting lipid panel, hemoglobin A1C and microalbuminuria.  Home sugars: BGs range between 100 and 220. Current treatment: Continued sulfonylurea which has been effective and Continued metformin which has been  effective.     Past Medical History:  Diagnosis Date  . Allergy   . Anemia   . Anxiety 01/2019  . Asthma   . Common migraine with intractable migraine 06/28/2017  . Diabetes ([place]) 02/2019  . GERD (gastroesophageal reflux disease)    pos H pylori  . Hypertension    controlled with medication  . IBS (irritable bowel syndrome) June 17, 2003  . Neonatal death    Vaginal delivery, full term-lived x2 hours.   . Palpitations   . Shortness of breath   . Spinal headache   . Valvular heart disease   . Vitamin D deficiency 02/2019    Past Surgical History:  Procedure Laterality Date  . ADENOIDECTOMY     and tonsils (as a child)  . boil  Jun 16, 2001   right elbow  . CESAREAN SECTION     x4  . CESAREAN SECTION  06/02/2011   Procedure: CESAREAN SECTION;  Surgeon: [name], MD;  Location: [facility];  Service: Gynecology;  Laterality: N/A;  Primary Cesarean Section Delivery Baby Boy @ 0004, Apgars 9/9  . CESAREAN SECTION N/A 12/10/2013   Procedure: REPEAT CESAREAN SECTION;  Surgeon: [name], MD;  Location: [facility];  Service: Obstetrics;  Laterality: N/A;  . colonoscopy  11/23/2018   stated had issues with sleep when in for procedure  . OTHER SURGICAL HISTORY  06/17/2003   Uterine surgery     Family History  Problem Relation Age of Onset  . Diabetes Mother   . Diabetes Father   . Diabetes Sister   . Anesthesia problems Neg Hx   . Colon cancer Neg Hx   . Esophageal cancer Neg Hx   . Rectal cancer Neg Hx   . Stomach cancer  Neg Hx   . Colon polyps Neg Hx     Social History   Socioeconomic History  . Marital status: Married    Spouse name: [name]  . Number of children: 4  . Years of education: Not on file  . Highest education level: Not on file  Occupational History  . Occupation: Unemployed  Tobacco Use  . Smoking status: Never Smoker  . Smokeless tobacco: Never Used  Vaping Use  . Vaping Use: Never used  Substance and Sexual Activity  . Alcohol use: No  . Drug use: No  .  Sexual activity: Yes    Birth control/protection: None, Implant    Comment: pregnant  Other Topics Concern  . Not on file  Social History Narrative   Lives with husband and child   Caffeine use: daily (tea), sometimes coffee/soda   Right handed    Social Determinants of Health   Financial Resource Strain:   . Difficulty of Paying Living Expenses:   Food Insecurity:   . Worried About Charity fundraiser in the Last Year:   . Arboriculturist in the Last Year:   Transportation Needs:   . Film/video editor (Medical):   Marland Kitchen Lack of Transportation (Non-Medical):   Physical Activity:   . Days of Exercise per Week:   . Minutes of Exercise per Session:   Stress:   . Feeling of Stress :   Social Connections:   . Frequency of Communication with Friends and Family:   . Frequency of Social Gatherings with Friends and Family:   . Attends Religious Services:   . Active Member of Clubs or Organizations:   . Attends Archivist Meetings:   Marland Kitchen Marital Status:   Intimate Partner Violence:   . Fear of Current or Ex-Partner:   . Emotionally Abused:   Marland Kitchen Physically Abused:   . Sexually Abused:     Outpatient Medications Prior to Visit  Medication Sig Dispense Refill  . acetaminophen (TYLENOL) 500 MG tablet Take 500 mg by mouth every 6 (six) hours as needed.    . blood glucose meter kit and supplies Dispense based on patient and insurance preference. Use up to four times daily as directed. (FOR ICD-10 E10.9, E11.9). 1 each 0  . Blood Pressure Monitoring DEVI 1 each by Does not apply route daily. 1 Device 0  . etonogestrel (NEXPLANON) 68 MG IMPL implant 1 each by Subdermal route once.    . fluticasone (FLONASE) 50 MCG/ACT nasal spray PLACE 2 SPRAYS INTO BOTH NOSTRILS DAILY. 16 g 6  . Fremanezumab-vfrm (AJOVY) 225 MG/1.5ML SOAJ Inject 225 mg into the skin every 30 (thirty) days. 1.5 mL 5  . Fremanezumab-vfrm (AJOVY) 225 MG/1.5ML SOAJ Inject 225 mg into the skin every 30 (thirty) days.  2 pen 0  . glucose blood (FREESTYLE LITE) test strip Use as instructed 100 each 12  . hydrocortisone (ANUSOL-HC) 2.5 % rectal cream Place 1 application rectally 2 (two) times daily. 30 g 1  . methylcellulose (CITRUCEL) oral powder Take as directed, daily    . Multiple Vitamin (MULTI VITAMIN DAILY PO) Take by mouth.    . ondansetron (ZOFRAN-ODT) 8 MG disintegrating tablet TAKE 1 TABLET BY MOUTH EVERY 8 HOURS AS NEEDED FOR NAUSEA OR VOMITING. 30 tablet 0  . potassium chloride 20 MEQ/15ML (10%) SOLN Take 15 mLs (20 mEq total) by mouth daily. 473 mL 2  . Rimegepant Sulfate (NURTEC) 75 MG TBDP Take 1 tablet at the onset of migraine.  30 tablet 1  . rosuvastatin (CRESTOR) 5 MG tablet Take 1 tablet (5 mg total) by mouth at bedtime. 30 tablet 11  . TRUEplus Lancets 28G MISC USE AS DIRECTED UP TO 4 TIMES DAILY 100 each 0  . vitamin B-12 (CYANOCOBALAMIN) 1000 MCG tablet Take 1 tablet (1,000 mcg total) by mouth daily. 90 tablet 0  . BREO ELLIPTA 100-25 MCG/INH AEPB INHALE 1 PUFF INTO THE LUNGS DAILY. 60 each 3  . dicyclomine (BENTYL) 10 MG/5ML solution Take by mouth 4 (four) times daily -  before meals and at bedtime.    . FEROSUL 325 (65 Fe) MG tablet TAKE 1 TABLET (325 MG TOTAL) BY MOUTH DAILY. 30 tablet 3  . gabapentin (NEURONTIN) 300 MG capsule Take 1 capsule (300 mg total) by mouth 3 (three) times daily. Take 2 capsules in the evening 90 capsule 2  . glipiZIDE (GLUCOTROL) 10 MG tablet TAKE 1 TABLET (10 MG TOTAL) BY MOUTH 2 (TWO) TIMES DAILY BEFORE A MEAL. (Patient taking differently: Take 30 mg by mouth daily before breakfast. ) 60 tablet 3  . hydrOXYzine (ATARAX/VISTARIL) 10 MG tablet Take 1 tablet (10 mg total) by mouth 3 (three) times daily as needed. 30 tablet 2  . levocetirizine (XYZAL) 5 MG tablet Take 5 mg by mouth every evening.    . metFORMIN (GLUCOPHAGE) 500 MG tablet Take 1 tablet (500 mg total) by mouth 2 (two) times daily with a meal. 180 tablet 3  . metoprolol succinate (TOPROL XL) 100 MG  24 hr tablet Take 1 tablet (100 mg total) by mouth daily. Take with or immediately following a meal. 90 tablet 3  . nortriptyline (PAMELOR) 25 MG capsule Take 25 mg by mouth at bedtime.    . pantoprazole (PROTONIX) 40 MG tablet TAKE 1 TABLET BY MOUTH 2 TIMES DAILY BEFORE A MEAL. 60 tablet 1  . VENTOLIN HFA 108 (90 Base) MCG/ACT inhaler INHALE 2 PUFFS INTO THE LUNGS EVERY 6 (SIX) HOURS AS NEEDED FOR WHEEZING OR SHORTNESS OF BREATH. 18 g 2  . Vitamin D, Ergocalciferol, (DRISDOL) 1.25 MG (50000 UT) CAPS capsule Take 1 capsule (50,000 Units total) by mouth every 7 (seven) days. 5 capsule 6  . rizatriptan (MAXALT) 10 MG tablet Take 1 tablet (10 mg total) by mouth 3 (three) times daily as needed for migraine. May repeat in 2 hours if needed (Patient not taking: Reported on 10/01/2019) 10 tablet 3   No facility-administered medications prior to visit.    Allergies  Allergen Reactions  . Topamax [Topiramate] Itching and Rash    ROS Review of Systems  Cardiovascular: Positive for palpitations.  Gastrointestinal: Positive for abdominal pain.  Musculoskeletal: Positive for back pain.      Objective:    Physical Exam Constitutional:      General: She is not in acute distress.    Appearance: She is obese. She is not ill-appearing or toxic-appearing.  HENT:     Head: Normocephalic and atraumatic.     Nose: Nose normal.     Mouth/Throat:     Mouth: Mucous membranes are moist.  Cardiovascular:     Rate and Rhythm: Normal rate and regular rhythm.     Pulses: Normal pulses.     Heart sounds: Normal heart sounds.  Pulmonary:     Effort: Pulmonary effort is normal.     Breath sounds: Normal breath sounds.  Abdominal:     General: Bowel sounds are normal.     Palpations: Abdomen is soft.  Tenderness: There is abdominal tenderness.  Musculoskeletal:        General: Normal range of motion.     Cervical back: Normal range of motion.  Skin:    General: Skin is warm and dry.  Neurological:       General: No focal deficit present.     Mental Status: She is alert and oriented to person, place, and time.  Psychiatric:        Mood and Affect: Mood normal.        Behavior: Behavior normal.        Thought Content: Thought content normal.        Judgment: Judgment normal.     BP 120/78 (BP Location: Left Arm, Patient Position: Sitting)   Pulse 96   Temp 98.5 F (36.9 C) (Temporal)   Ht 4' 10"  (1.473 m)   Wt 159 lb (72.1 kg)   SpO2 100%   BMI 33.23 kg/m  Wt Readings from Last 3 Encounters:  10/01/19 159 lb (72.1 kg)  09/17/19 158 lb (71.7 kg)  09/13/19 157 lb 9.6 oz (71.5 kg)     Health Maintenance Due  Topic Date Due  . Hepatitis C Screening  Never done  . PNEUMOCOCCAL POLYSACCHARIDE VACCINE AGE 24-64 HIGH RISK  Never done  . FOOT EXAM  Never done  . OPHTHALMOLOGY EXAM  Never done  . PAP SMEAR-Modifier  06/19/2016    There are no preventive care reminders to display for this patient.  Lab Results  Component Value Date   TSH 2.230 11/13/2018   Lab Results  Component Value Date   WBC 9.5 06/14/2019   HGB 10.6 (L) 06/14/2019   HCT 34.3 06/14/2019   MCV 80 06/14/2019   PLT 438 06/14/2019   Lab Results  Component Value Date   NA 137 08/20/2019   K 4.5 08/20/2019   CO2 21 05/21/2019   GLUCOSE 182 (H) 08/20/2019   BUN 8 08/20/2019   CREATININE 0.68 08/20/2019   BILITOT <0.2 08/20/2019   ALKPHOS 85 08/20/2019   AST 11 08/20/2019   ALT 14 05/21/2019   PROT 7.6 08/20/2019   ALBUMIN 4.3 08/20/2019   CALCIUM 9.6 08/20/2019   ANIONGAP 11 08/30/2018   GFR 119.42 08/28/2018   Lab Results  Component Value Date   CHOL 135 08/20/2019   Lab Results  Component Value Date   HDL 37 (L) 08/20/2019   Lab Results  Component Value Date   LDLCALC 68 08/20/2019   Lab Results  Component Value Date   TRIG 175 (H) 08/20/2019   Lab Results  Component Value Date   CHOLHDL 3.6 08/20/2019   Lab Results  Component Value Date   HGBA1C 6.8 (A) 08/20/2019    HGBA1C 6.8 08/20/2019   HGBA1C 6.8 (A) 08/20/2019   HGBA1C 6.8 08/20/2019      Assessment & Plan:   Problem List Items Addressed This Visit      Endocrine   Type 2 diabetes mellitus with hyperglycemia, without long-term current use of insulin (HCC)   Relevant Medications   metFORMIN (GLUCOPHAGE) 500 MG tablet   glipiZIDE (GLUCOTROL) 10 MG tablet We will continue with current regimen patient will be traveling to [place] for approximately 6 weeks and admits that her diet will not be controlled.     Other   Anxiety   Relevant Medications   gabapentin (NEURONTIN) 300 MG capsule   hydrOXYzine (ATARAX/VISTARIL) 10 MG tablet   nortriptyline (PAMELOR) 25 MG capsule Increase anxiety due to  traveling to [place].  She has not been there for approximately 10 years she worries about the accommodations and potential for malaria   Sinus tachycardia   Relevant Medications   metoprolol succinate (TOPROL XL) 100 MG 24 hr tablet   Low back pain - Primary   Relevant Orders   POCT Urinalysis Dipstick (Completed)   Anemia   Relevant Medications   ferrous sulfate (FEROSUL) 325 (65 FE) MG tablet    Other Visit Diagnoses    Controlled type 2 diabetes with neuropathy (HCC)       Relevant Medications   metFORMIN (GLUCOPHAGE) 500 MG tablet   glipiZIDE (GLUCOTROL) 10 MG tablet   Fatigue, unspecified type     Encourage patient to take leisurely walks to help with anxiety and to combat fatigue   Mild intermittent asthma without complication       Relevant Medications   fluticasone furoate-vilanterol (BREO ELLIPTA) 100-25 MCG/INH AEPB   albuterol (VENTOLIN HFA) 108 (90 Base) MCG/ACT inhaler   Paresthesia       Patient education on diabetes and neuropathy   Relevant Medications   gabapentin (NEURONTIN) 300 MG capsule      Meds ordered this encounter  Medications  . ciprofloxacin (CIPRO) 500 MG tablet    Sig: Take 1 tablet (500 mg total) by mouth 2 (two) times daily for 5 days.    Dispense:  10  tablet    Refill:  0    Order Specific Question:   Supervising Provider    Answer:   Tresa Garter [id number]  . fluticasone furoate-vilanterol (BREO ELLIPTA) 100-25 MCG/INH AEPB    Sig: INHALE 1 PUFF INTO THE LUNGS DAILY.    Dispense:  60 each    Refill:  3    Order Specific Question:   Supervising Provider    Answer:   Tresa Garter [id number]  . dicyclomine (BENTYL) 10 MG/5ML solution    Sig: Take 5 mLs (10 mg total) by mouth 4 (four) times daily -  before meals and at bedtime.    Dispense:  600 mL    Refill:  2    Order Specific Question:   Supervising Provider    Answer:   Tresa Garter [id number]  . ferrous sulfate (FEROSUL) 325 (65 FE) MG tablet    Sig: TAKE 1 TABLET (325 MG TOTAL) BY MOUTH DAILY.    Dispense:  90 tablet    Refill:  1    Order Specific Question:   Supervising Provider    Answer:   Tresa Garter [id number]  . gabapentin (NEURONTIN) 300 MG capsule    Sig: Take 1 capsule (300 mg total) by mouth 3 (three) times daily. Take 2 capsules in the evening    Dispense:  270 capsule    Refill:  0    Order Specific Question:   Supervising Provider    Answer:   Tresa Garter [id number]  . hydrOXYzine (ATARAX/VISTARIL) 10 MG tablet    Sig: Take 1 tablet (10 mg total) by mouth 3 (three) times daily as needed.    Dispense:  90 tablet    Refill:  2    Order Specific Question:   Supervising Provider    Answer:   Tresa Garter [id number]  . levocetirizine (XYZAL) 5 MG tablet    Sig: Take 1 tablet (5 mg total) by mouth every evening.    Dispense:  90 tablet    Refill:  0    Order Specific Question:  Supervising Provider    Answer:   Tresa Garter [id number]  . metFORMIN (GLUCOPHAGE) 500 MG tablet    Sig: Take 1 tablet (500 mg total) by mouth 2 (two) times daily with a meal.    Dispense:  180 tablet    Refill:  3    Order Specific Question:   Supervising Provider    Answer:   Tresa Garter [id number]  . metoprolol  succinate (TOPROL XL) 100 MG 24 hr tablet    Sig: Take 1 tablet (100 mg total) by mouth daily. Take with or immediately following a meal.    Dispense:  90 tablet    Refill:  3    Order Specific Question:   Supervising Provider    Answer:   Tresa Garter [id number]  . nortriptyline (PAMELOR) 25 MG capsule    Sig: Take 1 capsule (25 mg total) by mouth at bedtime.    Dispense:  90 capsule    Refill:  0    Order Specific Question:   Supervising Provider    Answer:   Tresa Garter [id number]  . pantoprazole (PROTONIX) 40 MG tablet    Sig: TAKE 1 TABLET BY MOUTH 2 TIMES DAILY BEFORE A MEAL.    Dispense:  180 tablet    Refill:  1    Order Specific Question:   Supervising Provider    Answer:   Tresa Garter [id number]  . albuterol (VENTOLIN HFA) 108 (90 Base) MCG/ACT inhaler    Sig: INHALE 2 PUFFS INTO THE LUNGS EVERY 6 (SIX) HOURS AS NEEDED FOR WHEEZING OR SHORTNESS OF BREATH.    Dispense:  18 g    Refill:  2    Order Specific Question:   Supervising Provider    Answer:   Tresa Garter [id number]  . glipiZIDE (GLUCOTROL) 10 MG tablet    Sig: Take 1 tablet (10 mg total) by mouth 2 (two) times daily before a meal.    Dispense:  180 tablet    Refill:  0    Order Specific Question:   Supervising Provider    Answer:   Tresa Garter [[id number]]    Follow-up: Return in about 3 months (around 01/01/2020).    Vevelyn Francois, NP

## 2019-10-02 ENCOUNTER — Ambulatory Visit: Payer: Self-pay

## 2019-10-02 DIAGNOSIS — R293 Abnormal posture: Secondary | ICD-10-CM

## 2019-10-02 DIAGNOSIS — G8929 Other chronic pain: Secondary | ICD-10-CM

## 2019-10-02 NOTE — Therapy (Signed)
[facility] [address] [place], [place], [address] Phone: [phone]   Fax:  [phone]  Physical Therapy Treatment  Patient Details  Name: Betty Cain MRN: [id number] Date of Birth: 12-30-1975 Referring Provider (PT): Butler Denmark, NP   Encounter Date: 10/02/2019   PT End of Session - 10/02/19 1026    Visit Number 11    Number of Visits 12    Date for PT Re-Evaluation 10/14/19    PT Start Time 1021   PT running behind   PT Stop Time 1103    PT Time Calculation (min) 42 min    Activity Tolerance Patient tolerated treatment well    Behavior During Therapy [facility] for tasks assessed/performed           Past Medical History:  Diagnosis Date  . Allergy   . Anemia   . Anxiety 01/2019  . Asthma   . Common migraine with intractable migraine 06/28/2017  . Diabetes ([place]) 02/2019  . GERD (gastroesophageal reflux disease)    pos H pylori  . Hypertension    controlled with medication  . IBS (irritable bowel syndrome) August 02, 2003  . Neonatal death    Vaginal delivery, full term-lived x2 hours.   . Palpitations   . Shortness of breath   . Spinal headache   . Valvular heart disease   . Vitamin D deficiency 02/2019    Past Surgical History:  Procedure Laterality Date  . ADENOIDECTOMY     and tonsils (as a child)  . boil  08/01/2001   right elbow  . CESAREAN SECTION     x4  . CESAREAN SECTION  06/02/2011   Procedure: CESAREAN SECTION;  Surgeon: [name], MD;  Location: [facility];  Service: Gynecology;  Laterality: N/A;  Primary Cesarean Section Delivery Baby Boy @ 0004, Apgars 9/9  . CESAREAN SECTION N/A 12/10/2013   Procedure: REPEAT CESAREAN SECTION;  Surgeon: [name], MD;  Location: [facility];  Service: Obstetrics;  Laterality: N/A;  . colonoscopy  11/23/2018   stated had issues with sleep when in for procedure  . OTHER SURGICAL HISTORY  08-02-03   Uterine surgery     There were no vitals filed for this visit.    Subjective Assessment - 10/02/19 1026    Subjective Pt reports that 3 days ago she was sleeping and rolled over and developed increased right sided back pain that woke her up. She saw the PCP as was concerned about kidneys. No UTI but reports that one of the proteins was high. Was referred to nephrologist but insurance does not cover so waiting on orange pass.    Patient is accompained by: Interpreter   Betty Cain, interpreter   Pertinent History DM, vertigo, mirgraines    Limitations Lifting;Standing;Walking;House hold activities    How long can you sit comfortably? no issues    How long can you stand comfortably? 15-20 min    How long can you walk comfortably? 15-20 min    Diagnostic tests 04/28/18: Standing frontal, standing lateral, and standing spot lumbosacrallateral images were obtained. The there are 5 non-rib-bearing lumbartype vertebral bodies. There is slight lumbar dextroscoliosis. Nofracture or spondylolisthesis. The disc spaces appear normal. Noerosive change.    Patient Stated Goals Be able to move better    Currently in Pain? Yes    Pain Score 8     Pain Location Back    Pain Orientation Right    Pain Type Acute pain;Chronic pain    Pain  Onset More than a month ago    Pain Frequency Constant    Aggravating Factors  deep breathing                             [facility] Adult PT Treatment/Exercise - 10/02/19 1101      Exercises   Exercises Other Exercises    Other Exercises  PT had patient perform gentle thoracic rotation seated with arms crossed x 5 each side within tolerable range. Pt reported some increase pain initially with turning to left on right side. Seated scapular retraction x 10.       Manual Therapy   Manual therapy comments Pt was unable to tolerate prone position for manual techniques due to pain. STM to paraspinals mostly on right thoracic and lumbar spine as well as rhomboid/mid trap area seated x 15 min. Pt was cued to try to relax and breath  throughout. Pt noted numerous trigger points and pt very tight throughout. Pain down to 6/10 after.                   PT Education - 10/02/19 2141    Education Details Discussed using her massage gun at home; upright posture in sitting with pillow behind back ; barrel stretch and scapular retraction; heat to back x 20 min 3x/day. Educated that she be sure to put something between skin and hot pack. Also suggested warm bath before bed a night to try to help relax muscles.    Person(s) Educated Patient    Methods Explanation;Demonstration    Comprehension Verbalized understanding            PT Short Term Goals - 09/14/19 1644      PT SHORT TERM GOAL #1   Title ALL STGs = LTGs             PT Long Term Goals - 09/14/19 1639      PT LONG TERM GOAL #1   Title Pt will demonstrate ability to perform 5 repetitions of retrieving an object weighing >/= 5 pounds to and from the floor with proper use of mehcanics to demonstrate improvement in safety with ADLs. (ALL LTGs due 10/14/19)    Baseline Baseline: painful and poor mehcanics utilized with this activity    Time 4    Period Weeks    Status New    Target Date 10/14/19      PT LONG TERM GOAL #2   Title Pt will demo compliance with HEP and activity modification with ADLs to improve self management of her symptoms.    Time 4    Period Weeks    Status New      PT LONG TERM GOAL #3   Title Patient will demo >/= 4+/5 strength with bilateral lower extremities improve functional strength with transfers.    Time 4    Period Weeks    Status New                 Plan - 10/02/19 2146    Clinical Impression Statement Pt was reporting significant increased pain in right mid thoracic area the past 3 days. PT did note tightness in paraspinals on right and in mid trap/rhomboid area with numerous trigger points. Low back and leg pain seems to be doing okay. PT addressed thoracic pain with STM with some decrease in pain at end of  session.    Examination-Activity Limitations Caring for Others;Carry;Lift;Squat;Stand;Transfers  Examination-Participation Restrictions Cleaning;Community Activity;Laundry;Meal Prep;Shop;Yard Work    Stability/Clinical Decision Making Evolving/Moderate complexity    Rehab Potential Good    PT Frequency 1x / week    PT Duration 4 weeks    PT Treatment/Interventions ADLs/Self Care Home Management;Therapeutic activities;Therapeutic exercise;Balance training;Neuromuscular re-education;Patient/family education;Manual techniques;Passive range of motion;Energy conservation;Joint Manipulations;Vestibular    PT Next Visit Plan Recheck goals as last scheduled visit. How is right sided thoracic pain doing since last session?    Consulted and Agree with Plan of Care Patient           Patient will benefit from skilled therapeutic intervention in order to improve the following deficits and impairments:  Decreased strength, Hypomobility, Difficulty walking, Increased muscle spasms, Impaired flexibility, Impaired sensation, Pain  Visit Diagnosis: Abnormal posture  Chronic low back pain, unspecified back pain laterality, unspecified whether sciatica present     Problem List Patient Active Problem List   Diagnosis Date Noted  . Diabetes mellitus with proteinuria ([place]) 08/20/2019  . History of Helicobacter pylori infection 08/08/2019  . Proteinuria 06/14/2019  . Elevated sed rate 05/23/2019  . Elevated C-reactive protein (CRP) 05/23/2019  . Diabetes mellitus without complication ([place]) 33/54/5625  . Dyslipidemia (high LDL; low HDL) 05/23/2019  . Type 2 diabetes mellitus with hyperglycemia, without long-term current use of insulin ([place]) 02/17/2019  . Hemoglobin A1C between 7% and 9% indicating borderline diabetic control 02/17/2019  . Breast cancer screening by mammogram 02/17/2019  . Frequent headaches 02/14/2019  . Anxiety 02/14/2019  . Common migraine with intractable migraine 06/28/2017  .  Chest pain at rest 10/17/2016  . Sinus tachycardia 08/18/2016  . Prolonged Q-T interval on ECG 08/18/2016  . Vertigo 07/22/2015  . Abdominal discomfort 02/14/2015  . Low back pain 02/14/2015  . Abdominal pain 02/01/2015  . Nausea 02/01/2015  . Environmental allergies 02/01/2015  . Language barrier to communication 02/01/2015  . Anemia 01/23/2015  . Constipation 01/21/2015  . Knee pain, bilateral 01/21/2015  . Palpitations 09/18/2013  . Shortness of breath 09/18/2013  . Advanced maternal age in pregnancy in second trimester 06/19/2013  . IBS (irritable bowel syndrome) 04/13/2003    Electa Sniff, PT, DPT, NCS 10/02/2019, 9:50 PM  [facility] [address] [place], [place], [address] Phone: [phone]   Fax:  [phone]  Name: Betty Cain MRN: [id number] Date of Birth: 11/12/1975

## 2019-10-03 ENCOUNTER — Other Ambulatory Visit: Payer: Self-pay

## 2019-10-03 ENCOUNTER — Ambulatory Visit: Payer: Self-pay | Attending: Family Medicine

## 2019-10-03 ENCOUNTER — Other Ambulatory Visit: Payer: Self-pay | Admitting: Nurse Practitioner

## 2019-10-03 MED ORDER — GABAPENTIN 300 MG PO CAPS
ORAL_CAPSULE | ORAL | 1 refills | Status: DC
Start: 2019-10-03 — End: 2020-02-22

## 2019-10-03 MED FILL — PANTOPRAZOLE SOD DR 40 MG T: 40 | 90 days supply | Qty: 180 | Fill #0

## 2019-10-03 MED FILL — hydrOXYzine HCL 10 MG TABS: 10 | 90 days supply | Qty: 270 | Fill #0

## 2019-10-03 MED FILL — METFORMIN HCL 500 MG TABS: 500 | 90 days supply | Qty: 180 | Fill #0

## 2019-10-03 MED FILL — NORTRIPTYLINE HCL 25 MG CAP: 25 | 90 days supply | Qty: 90 | Fill #0

## 2019-10-03 MED FILL — FERROUS SULFATE 325 MG TAB: 325 (65 FE) | 90 days supply | Qty: 90 | Fill #0

## 2019-10-03 MED FILL — LEVOCETIRIZINE 5 MG TABLET: 5 | 90 days supply | Qty: 90 | Fill #0

## 2019-10-03 MED FILL — METOPROLOL SUCCINATE ER 100: 100 | 90 days supply | Qty: 90 | Fill #0

## 2019-10-03 MED FILL — GABAPENTIN 300 MG CAPSULE: 300 | 90 days supply | Qty: 270 | Fill #0

## 2019-10-03 MED FILL — glipiZIDE 10 MG TABS: 10 | 90 days supply | Qty: 180 | Fill #0

## 2019-10-05 ENCOUNTER — Ambulatory Visit: Payer: Medicaid Other

## 2019-10-05 MED FILL — ROSUVASTATIN CALCIUM 5 MG T: 5 | 30 days supply | Qty: 30 | Fill #4

## 2019-10-09 ENCOUNTER — Ambulatory Visit: Payer: Self-pay

## 2019-10-09 ENCOUNTER — Other Ambulatory Visit: Payer: Self-pay | Admitting: Family Medicine

## 2019-10-09 ENCOUNTER — Other Ambulatory Visit: Payer: Self-pay

## 2019-10-09 ENCOUNTER — Telehealth: Payer: Self-pay | Admitting: Adult Health

## 2019-10-09 DIAGNOSIS — M6281 Muscle weakness (generalized): Secondary | ICD-10-CM

## 2019-10-09 DIAGNOSIS — M545 Low back pain, unspecified: Secondary | ICD-10-CM

## 2019-10-09 MED FILL — ROSUVASTATIN CALCIUM 5 MG T: 5 | 60 days supply | Qty: 60 | Fill #5

## 2019-10-09 MED FILL — TRUE METRIX TEST STRIP: 75 days supply | Qty: 300 | Fill #1

## 2019-10-09 MED FILL — TRUEplus LANCETS 28G MISC: 75 days supply | Qty: 300 | Fill #0

## 2019-10-09 NOTE — Telephone Encounter (Signed)
I called and spoke to [employer], with [employer]ur[employer] PAP.  Pt can only get amount 8/ 30 days.  Received delivery 10-08-19.  She will be out to country after 10-14-19 and was trying to get more if she could.  I told her what they told me, she could try and call them and see if they could send to another address.  Also called carrie, with [employer] about the PAP financial aid needing NPI for MM/NP.  She stated that they donot need this (only required responses).  I emailed pt back and Morey Hummingbird with [employer] will call her about this as well.

## 2019-10-09 NOTE — Telephone Encounter (Signed)
Patient needs a refill Rimegepant Sulfate (NURTEC) 75 MG TBDP . Patient needs a refill before July 4 th because [place] . Patient is requesting a two month refill.

## 2019-10-09 NOTE — Therapy (Signed)
[facility] [address] [place], [place], [address] Phone: [phone]   Fax:  [phone]  Physical Therapy Treatment/Discharge Summary  Patient Details  Name: Betty Cain MRN: [id number] Date of Birth: 07-03-1975 Referring Provider (PT): Butler Denmark, NP  PHYSICAL THERAPY DISCHARGE SUMMARY  Visits from Start of Care: 12  Current functional level related to goals / functional outcomes: See clinical impression below. Pt's pain overall has decreased. Independent with functional mobility.   Remaining deficits: Varied levels of back pain   Education / Equipment: HEP  Plan: Patient agrees to discharge.  Patient goals were met. Patient is being discharged due to meeting the stated rehab goals.  ?????       Encounter Date: 10/09/2019   PT End of Session - 10/09/19 1020    Visit Number 12    Number of Visits 12    Date for PT Re-Evaluation 10/14/19    PT Start Time 05/28/1016    PT Stop Time 1056    PT Time Calculation (min) 38 min    Activity Tolerance Patient tolerated treatment well    Behavior During Therapy WFL for tasks assessed/performed           Past Medical History:  Diagnosis Date  . Allergy   . Anemia   . Anxiety 01/2019  . Asthma   . Common migraine with intractable migraine 06/28/2017  . Diabetes ([place]) 02/2019  . GERD (gastroesophageal reflux disease)    pos H pylori  . Hypertension    controlled with medication  . IBS (irritable bowel syndrome) May 29, 2003  . Neonatal death    Vaginal delivery, full term-lived x2 hours.   . Palpitations   . Shortness of breath   . Spinal headache   . Valvular heart disease   . Vitamin D deficiency 02/2019    Past Surgical History:  Procedure Laterality Date  . ADENOIDECTOMY     and tonsils (as a child)  . boil  05/28/01   right elbow  . CESAREAN SECTION     x4  . CESAREAN SECTION  06/02/2011   Procedure: CESAREAN SECTION;  Surgeon: [name], MD;   Location: [facility];  Service: Gynecology;  Laterality: N/A;  Primary Cesarean Section Delivery Baby Boy @ 0004, Apgars 9/9  . CESAREAN SECTION N/A 12/10/2013   Procedure: REPEAT CESAREAN SECTION;  Surgeon: [name], MD;  Location: [facility];  Service: Obstetrics;  Laterality: N/A;  . colonoscopy  11/23/2018   stated had issues with sleep when in for procedure  . OTHER SURGICAL HISTORY  29-May-2003   Uterine surgery     There were no vitals filed for this visit.   Subjective Assessment - 10/09/19 1021    Subjective Pt reports that she felt like her sugar was really low in middle of night and could hardly get up. She did drink a pepsi when occured. Has not checked sugar since but feeling better. Pt reports that her back is better than last week. Is having some pain around right knee. Pt reports she will be leaving this weekend to go out of the country to [place].    Patient is accompained by: Interpreter   [name], interpreter   Pertinent History DM, vertigo, mirgraines    Limitations Lifting;Standing;Walking;House hold activities    How long can you sit comfortably? no issues    How long can you stand comfortably? 15-20 min    How long can you walk comfortably? 15-20 min  Diagnostic tests 04/28/18: Standing frontal, standing lateral, and standing spot lumbosacrallateral images were obtained. The there are 5 non-rib-bearing lumbartype vertebral bodies. There is slight lumbar dextroscoliosis. Nofracture or spondylolisthesis. The disc spaces appear normal. Noerosive change.    Patient Stated Goals Be able to move better    Currently in Pain? Yes    Pain Score 7     Pain Location Back    Pain Orientation Right;Mid    Pain Descriptors / Indicators Aching    Pain Type Acute pain;Chronic pain    Pain Onset More than a month ago    Pain Frequency Intermittent              [facility] PT Assessment - 10/09/19 1026      Strength   Right Hip Flexion 4+/5    Right Hip Extension 4/5   pain in low back    Right Hip ABduction 4+/5    Left Hip Flexion 4+/5    Left Hip Extension 4+/5    Left Hip ABduction 4+/5                         [facility] Adult PT Treatment/Exercise - 10/09/19 1026      Transfers   Transfers Sit to Stand;Stand to Sit    Sit to Stand 7: Independent    Stand to Sit 7: Independent      Therapeutic Activites    Therapeutic Activities Other Therapeutic Activities;Lifting    Lifting Pt performed lift x 6 picking up 6# weight off floor with verbal cues and demonstration for proper technique. Pt was initially standing too far away and reaching to set weight down. Advised to get close and keep weight close to body. Did well with squatting versus bending at back.      Exercises   Exercises Other Exercises    Other Exercises  Hooklying: unilateral bent knee fallout with TA contraction x 10 bilateral, bridges x 10, heel slides x 10, attempted march but pain ini right knee so stopped. Quadruped alternating leg lifts x 5 each. Pt was instructed to maintain transverse abdominus contraction with tight tummy throughout to stabilize back. Seated bilateral scapular retraction x 10. Discussed adding her band back in to exercises as able as well.                   PT Education - 10/09/19 1416    Education Details Education on continuing with current HEP. Discussed importance of taking the time to work on herself even when traveling. Advised getting up every hour at least on plan to move around. Discussed using head prn to help if gets flare up as well as massage but continue with gentle movements as much as possible.    Person(s) Educated Patient    Methods Explanation;Demonstration    Comprehension Verbalized understanding;Returned demonstration            PT Short Term Goals - 09/14/19 1644      PT SHORT TERM GOAL #1   Title ALL STGs = LTGs             PT Long Term Goals - 10/09/19 1417      PT LONG TERM GOAL #1   Title Pt will demonstrate ability to  perform 5 repetitions of retrieving an object weighing >/= 5 pounds to and from the floor with proper use of mehcanics to demonstrate improvement in safety with ADLs. (ALL LTGs due 10/14/19)  Baseline able to demonstrate proper form with less pain after instruction    Time 4    Period Weeks    Status Achieved      PT LONG TERM GOAL #2   Title Pt will demo compliance with HEP and activity modification with ADLs to improve self management of her symptoms.    Baseline Has been issued written HEP.    Time 4    Period Weeks    Status Achieved      PT LONG TERM GOAL #3   Title Patient will demo >/= 4+/5 strength with bilateral lower extremities improve functional strength with transfers.    Baseline hips grossly 4+/5 only limited by pain with resistance on right    Time 4    Period Weeks    Status Achieved                 Plan - 10/09/19 1418    Clinical Impression Statement Pt has met all LTGs at this time. She does have varying levels of back pain at times but reports lower pain overall. She has been instructed in HEP for core stabilization as well as education on proper body mechanics to protect back with household activities. PT discharging at this time with pt leaving country for a month or two.    Examination-Activity Limitations Caring for Others;Carry;Lift;Squat;Stand;Transfers    Examination-Participation Restrictions Cleaning;Community Activity;Laundry;Meal Prep;Shop;Yard Work    Stability/Clinical Decision Making Evolving/Moderate complexity    Rehab Potential Good    PT Frequency 1x / week    PT Duration 4 weeks    PT Treatment/Interventions ADLs/Self Care Home Management;Therapeutic activities;Therapeutic exercise;Balance training;Neuromuscular re-education;Patient/family education;Manual techniques;Passive range of motion;Energy conservation;Joint Manipulations;Vestibular    PT Next Visit Plan Discharge PT today    Consulted and Agree with Plan of Care Patient            Patient will benefit from skilled therapeutic intervention in order to improve the following deficits and impairments:  Decreased strength, Hypomobility, Difficulty walking, Increased muscle spasms, Impaired flexibility, Impaired sensation, Pain  Visit Diagnosis: Chronic low back pain, unspecified back pain laterality, unspecified whether sciatica present  Muscle weakness (generalized)     Problem List Patient Active Problem List   Diagnosis Date Noted  . Diabetes mellitus with proteinuria ([place]) 08/20/2019  . History of Helicobacter pylori infection 08/08/2019  . Proteinuria 06/14/2019  . Elevated sed rate 05/23/2019  . Elevated C-reactive protein (CRP) 05/23/2019  . Diabetes mellitus without complication ([place]) 82/64/1583  . Dyslipidemia (high LDL; low HDL) 05/23/2019  . Type 2 diabetes mellitus with hyperglycemia, without long-term current use of insulin ([place]) 02/17/2019  . Hemoglobin A1C between 7% and 9% indicating borderline diabetic control 02/17/2019  . Breast cancer screening by mammogram 02/17/2019  . Frequent headaches 02/14/2019  . Anxiety 02/14/2019  . Common migraine with intractable migraine 06/28/2017  . Chest pain at rest 10/17/2016  . Sinus tachycardia 08/18/2016  . Prolonged Q-T interval on ECG 08/18/2016  . Vertigo 07/22/2015  . Abdominal discomfort 02/14/2015  . Low back pain 02/14/2015  . Abdominal pain 02/01/2015  . Nausea 02/01/2015  . Environmental allergies 02/01/2015  . Language barrier to communication 02/01/2015  . Anemia 01/23/2015  . Constipation 01/21/2015  . Knee pain, bilateral 01/21/2015  . Palpitations 09/18/2013  . Shortness of breath 09/18/2013  . Advanced maternal age in pregnancy in second trimester 06/19/2013  . IBS (irritable bowel syndrome) 04/13/2003    Electa Sniff, PT, DPT, NCS 10/09/2019, 2:31 PM  [facility] [address] [place], [place], [address] Phone:  [phone]   Fax:  [phone]  Name: Betty Cain MRN: [id number] Date of Birth: 01-26-1976

## 2019-10-12 ENCOUNTER — Ambulatory Visit: Payer: Medicaid Other

## 2019-10-25 ENCOUNTER — Ambulatory Visit: Payer: Medicaid Other | Admitting: Neurology

## 2019-12-13 ENCOUNTER — Telehealth: Payer: Self-pay | Admitting: Adult Health

## 2019-12-13 NOTE — Telephone Encounter (Signed)
[employer] Morey Hummingbird) Fremanezumab-vfrm (AJOVY) 225 MG/1.5ML SOAJ is not a preferred drug. Can submit a document under the commercial plan to try to get medication approved. [employer] sending prescription to preferred pharamacy, [facility]. Will be closing this case.

## 2019-12-18 NOTE — Telephone Encounter (Addendum)
PA is submitted and pending KEY: [id number] PA Case ID: [id number]  [employer] has received your information, and the request will be reviewed. You may close this dialog, return to your dashboard, and perform other tasks.  You will receive an electronic determination in CoverMyMeds. You can see the latest determination by locating this request on your dashboard or by reopening this request. You will also receive a faxed copy of the determination. If you have any questions please contact [employer] at [phone].  If you need assistance, please chat with CoverMyMeds or call us at [phone]

## 2019-12-19 NOTE — Progress Notes (Unsigned)
PA submitted for Ajovy 225mg / 1.5ML on Cover my Meds 12/18/19 There is a 24/72hr turn around. PA ID #: [id number] Key: [id number] Status: PENDING

## 2019-12-20 ENCOUNTER — Ambulatory Visit: Payer: Medicaid Other | Admitting: Neurology

## 2019-12-20 ENCOUNTER — Telehealth: Payer: Self-pay

## 2019-12-20 ENCOUNTER — Telehealth: Payer: Self-pay | Admitting: Neurology

## 2019-12-20 ENCOUNTER — Other Ambulatory Visit: Payer: Self-pay | Admitting: Neurology

## 2019-12-20 ENCOUNTER — Other Ambulatory Visit: Payer: Self-pay

## 2019-12-20 ENCOUNTER — Encounter: Payer: Self-pay | Admitting: Neurology

## 2019-12-20 VITALS — BP 113/75 | HR 87 | Ht 59.0 in | Wt 154.0 lb

## 2019-12-20 DIAGNOSIS — G43019 Migraine without aura, intractable, without status migrainosus: Secondary | ICD-10-CM

## 2019-12-20 MED ORDER — NURTEC 75 MG PO TBDP: ORAL_TABLET | ORAL | 1 refills | Status: DC

## 2019-12-20 NOTE — Telephone Encounter (Signed)
Rimegepant Sulfate (NURTEC) 75 MG TBDP  Is not covered by pt's insurance according to [facility] please call pharmacy at [phone]

## 2019-12-20 NOTE — Telephone Encounter (Signed)
Spoke to the patient to let her know the PA is pending. I will call [employer] on Monday 12/24/19 to check the status

## 2019-12-20 NOTE — Patient Instructions (Signed)
We will check on the approval for Ajovy  This will be once a month injection for migraine prevention  Continue the Nurtec as needed for acute headache See you back in 6 months

## 2019-12-20 NOTE — Telephone Encounter (Signed)
-----   Message from Suzzanne Cloud, NP sent at 12/20/2019  4:22 PM EDT ----- Check on PA of Ajovy and update patient please

## 2019-12-20 NOTE — Progress Notes (Signed)
PATIENT: Betty Cain DOB: 1975/05/12  REASON FOR VISIT: follow up HISTORY FROM: patient  HISTORY OF PRESENT ILLNESS: Today 12/20/19 Betty Cain is a 44 year old female with history of daily headaches.  When last seen, she was switched from [place] to [place], try Nurtec for acute headache.  Ajovy has yet to be approved, may have been delayed as she has been out of the country for 2 months in [place].  Took Nurtec with good benefit.  Had no injection of Aimovig for the last month, having daily migraine.  Headache is usually located left occipitally, will radiate forward right and left side.  She is sensitive to light and sound.  She may have nausea or vomiting.  Presents today for evaluation accompanied by interpreter.  She is still taking gabapentin, Toprol-XL, and nortriptyline.  HISTORY 09/17/2019 MM: Betty Cain is a 44 year old female with a history of daily headaches.  She returns today for follow-up.  She reports that her headaches have gotten worse.  Reports that she only has 10 headache free days a month.  Reports that her headaches can sometimes last 3 or 4 days.  Reports that Maxalt is no longer offering her any benefit.  Reports that her headaches usually start in the back of the head on the left side and radiates to the frontal region.  She denies photophobia and phonophobia.  But does report nausea.  She has remained on Aimovig, nortriptyline, gabapentin and Toprol   REVIEW OF SYSTEMS: Out of a complete 14 system review of symptoms, the patient complains only of the following symptoms, and all other reviewed systems are negative.  Headache  ALLERGIES: Allergies  Allergen Reactions  . Topamax [Topiramate] Itching and Rash    HOME MEDICATIONS: Outpatient Medications Prior to Visit  Medication Sig Dispense Refill  . acetaminophen (TYLENOL) 500 MG tablet Take 500 mg by mouth every 6 (six) hours as needed.    Marland Kitchen albuterol (VENTOLIN HFA) 108 (90 Base) MCG/ACT inhaler INHALE 2 PUFFS INTO  THE LUNGS EVERY 6 (SIX) HOURS AS NEEDED FOR WHEEZING OR SHORTNESS OF BREATH. 18 g 2  . blood glucose meter kit and supplies Dispense based on patient and insurance preference. Use up to four times daily as directed. (FOR ICD-10 E10.9, E11.9). 1 each 0  . Blood Pressure Monitoring DEVI 1 each by Does not apply route daily. 1 Device 0  . dicyclomine (BENTYL) 10 MG/5ML solution Take 5 mLs (10 mg total) by mouth 4 (four) times daily -  before meals and at bedtime. 600 mL 2  . etonogestrel (NEXPLANON) 68 MG IMPL implant 1 each by Subdermal route once.    . ferrous sulfate (FEROSUL) 325 (65 FE) MG tablet TAKE 1 TABLET (325 MG TOTAL) BY MOUTH DAILY. 90 tablet 1  . fluticasone (FLONASE) 50 MCG/ACT nasal spray PLACE 2 SPRAYS INTO BOTH NOSTRILS DAILY. 16 g 6  . fluticasone furoate-vilanterol (BREO ELLIPTA) 100-25 MCG/INH AEPB INHALE 1 PUFF INTO THE LUNGS DAILY. 60 each 3  . Fremanezumab-vfrm (AJOVY) 225 MG/1.5ML SOAJ Inject 225 mg into the skin every 30 (thirty) days. 1.5 mL 5  . Fremanezumab-vfrm (AJOVY) 225 MG/1.5ML SOAJ Inject 225 mg into the skin every 30 (thirty) days. 2 pen 0  . gabapentin (NEURONTIN) 300 MG capsule 1 cap am and 2 QHs 270 capsule 1  . glipiZIDE (GLUCOTROL) 10 MG tablet Take 1 tablet (10 mg total) by mouth 2 (two) times daily before a meal. 180 tablet 0  . glucose blood (FREESTYLE LITE) test strip  Use as instructed 100 each 12  . hydrocortisone (ANUSOL-HC) 2.5 % rectal cream Place 1 application rectally 2 (two) times daily. 30 g 1  . hydrOXYzine (ATARAX/VISTARIL) 10 MG tablet Take 1 tablet (10 mg total) by mouth 3 (three) times daily as needed. 90 tablet 2  . levocetirizine (XYZAL) 5 MG tablet Take 1 tablet (5 mg total) by mouth every evening. 90 tablet 0  . metFORMIN (GLUCOPHAGE) 500 MG tablet Take 1 tablet (500 mg total) by mouth 2 (two) times daily with a meal. 180 tablet 3  . methylcellulose (CITRUCEL) oral powder Take as directed, daily    . metoprolol succinate (TOPROL XL) 100 MG  24 hr tablet Take 1 tablet (100 mg total) by mouth daily. Take with or immediately following a meal. 90 tablet 3  . Multiple Vitamin (MULTI VITAMIN DAILY PO) Take by mouth.    . nortriptyline (PAMELOR) 25 MG capsule Take 1 capsule (25 mg total) by mouth at bedtime. 90 capsule 0  . ondansetron (ZOFRAN-ODT) 8 MG disintegrating tablet TAKE 1 TABLET BY MOUTH EVERY 8 HOURS AS NEEDED FOR NAUSEA OR VOMITING. 30 tablet 0  . pantoprazole (PROTONIX) 40 MG tablet TAKE 1 TABLET BY MOUTH 2 TIMES DAILY BEFORE A MEAL. 180 tablet 1  . potassium chloride 20 MEQ/15ML (10%) SOLN Take 15 mLs (20 mEq total) by mouth daily. 473 mL 2  . rizatriptan (MAXALT) 10 MG tablet Take 1 tablet (10 mg total) by mouth 3 (three) times daily as needed for migraine. May repeat in 2 hours if needed (Patient not taking: Reported on 10/01/2019) 10 tablet 3  . rosuvastatin (CRESTOR) 5 MG tablet Take 1 tablet (5 mg total) by mouth at bedtime. 30 tablet 11  . TRUEplus Lancets 28G MISC USE AS DIRECTED UP TO 4 TIMES DAILY 100 each 3  . Rimegepant Sulfate (NURTEC) 75 MG TBDP Take 1 tablet at the onset of migraine. 30 tablet 1   No facility-administered medications prior to visit.    PAST MEDICAL HISTORY: Past Medical History:  Diagnosis Date  . Allergy   . Anemia   . Anxiety 01/2019  . Asthma   . Common migraine with intractable migraine 06/28/2017  . Diabetes ([place]) 02/2019  . GERD (gastroesophageal reflux disease)    pos H pylori  . Hypertension    controlled with medication  . IBS (irritable bowel syndrome) 06/13/2003  . Neonatal death    Vaginal delivery, full term-lived x2 hours.   . Palpitations   . Shortness of breath   . Spinal headache   . Valvular heart disease   . Vitamin D deficiency 02/2019    PAST SURGICAL HISTORY: Past Surgical History:  Procedure Laterality Date  . ADENOIDECTOMY     and tonsils (as a child)  . boil  June 12, 2001   right elbow  . CESAREAN SECTION     x4  . CESAREAN SECTION  06/02/2011   Procedure:  CESAREAN SECTION;  Surgeon: [name], MD;  Location: [facility];  Service: Gynecology;  Laterality: N/A;  Primary Cesarean Section Delivery Baby Boy @ 0004, Apgars 9/9  . CESAREAN SECTION N/A 12/10/2013   Procedure: REPEAT CESAREAN SECTION;  Surgeon: [name], MD;  Location: [facility];  Service: Obstetrics;  Laterality: N/A;  . colonoscopy  11/23/2018   stated had issues with sleep when in for procedure  . OTHER SURGICAL HISTORY  June 13, 2003   Uterine surgery     FAMILY HISTORY: Family History  Problem Relation Age of Onset  .  Diabetes Mother   . Diabetes Father   . Diabetes Sister   . Anesthesia problems Neg Hx   . Colon cancer Neg Hx   . Esophageal cancer Neg Hx   . Rectal cancer Neg Hx   . Stomach cancer Neg Hx   . Colon polyps Neg Hx     SOCIAL HISTORY: Social History   Socioeconomic History  . Marital status: Married    Spouse name: [name]  . Number of children: 4  . Years of education: Not on file  . Highest education level: Not on file  Occupational History  . Occupation: Unemployed  Tobacco Use  . Smoking status: Never Smoker  . Smokeless tobacco: Never Used  Vaping Use  . Vaping Use: Never used  Substance and Sexual Activity  . Alcohol use: No  . Drug use: No  . Sexual activity: Yes    Birth control/protection: None, Implant    Comment: pregnant  Other Topics Concern  . Not on file  Social History Narrative   Lives with husband and child   Caffeine use: daily (tea), sometimes coffee/soda   Right handed    Social Determinants of Health   Financial Resource Strain:   . Difficulty of Paying Living Expenses: Not on file  Food Insecurity:   . Worried About Charity fundraiser in the Last Year: Not on file  . Ran Out of Food in the Last Year: Not on file  Transportation Needs:   . Lack of Transportation (Medical): Not on file  . Lack of Transportation (Non-Medical): Not on file  Physical Activity:   . Days of Exercise per Week: Not on file  .  Minutes of Exercise per Session: Not on file  Stress:   . Feeling of Stress : Not on file  Social Connections:   . Frequency of Communication with Friends and Family: Not on file  . Frequency of Social Gatherings with Friends and Family: Not on file  . Attends Religious Services: Not on file  . Active Member of Clubs or Organizations: Not on file  . Attends Archivist Meetings: Not on file  . Marital Status: Not on file  Intimate Partner Violence:   . Fear of Current or Ex-Partner: Not on file  . Emotionally Abused: Not on file  . Physically Abused: Not on file  . Sexually Abused: Not on file   PHYSICAL EXAM  Vitals:   12/20/19 1457  BP: 113/75  Pulse: 87  Weight: 154 lb (69.9 kg)  Height: _0  (1.499 m)   Body mass index is 31.1 kg/m.  Generalized: Well developed, in no acute distress   Neurological examination  Mentation: Alert oriented to time, place, history taking. Follows all commands speech and language fluent Cranial nerve II-XII: Pupils were equal round reactive to light. Extraocular movements were full, visual field were full on confrontational test. Facial sensation and strength were normal.  Head turning and shoulder shrug  were normal and symmetric. Motor: The motor testing reveals 5 over 5 strength of all 4 extremities. Good symmetric motor tone is noted throughout.  Sensory: Sensory testing is intact to soft touch on all 4 extremities. No evidence of extinction is noted.  Coordination: Cerebellar testing reveals good finger-nose-finger and heel-to-shin bilaterally.  Gait and station: Gait is normal.  Reflexes: Deep tendon reflexes are symmetric and normal bilaterally.   DIAGNOSTIC DATA (LABS, IMAGING, TESTING) - I reviewed patient records, labs, notes, testing and imaging myself where available.  Lab Results  Component Value Date   WBC 9.5 06/14/2019   HGB 10.6 (L) 06/14/2019   HCT 34.3 06/14/2019   MCV 80 06/14/2019   PLT 438 06/14/2019       Component Value Date/Time   NA 137 08/20/2019 1001   K 4.5 08/20/2019 1001   CL 101 08/20/2019 1001   CO2 21 05/21/2019 0920   GLUCOSE 182 (H) 08/20/2019 1001   GLUCOSE 185 (H) 08/30/2018 1240   GLUCOSE 77 09/21/2013 1230   BUN 8 08/20/2019 1001   CREATININE 0.68 08/20/2019 1001   CREATININE 0.58 09/14/2016 0939   CALCIUM 9.6 08/20/2019 1001   PROT 7.6 08/20/2019 1001   ALBUMIN 4.3 08/20/2019 1001   AST 11 08/20/2019 1001   ALT 14 05/21/2019 0920   ALKPHOS 85 08/20/2019 1001   BILITOT <0.2 08/20/2019 1001   GFRNONAA 109 08/20/2019 1001   GFRNONAA >89 09/14/2016 0939   GFRAA 126 08/20/2019 1001   GFRAA >89 09/14/2016 0939   Lab Results  Component Value Date   CHOL 135 08/20/2019   HDL 37 (L) 08/20/2019   LDLCALC 68 08/20/2019   TRIG 175 (H) 08/20/2019   CHOLHDL 3.6 08/20/2019   Lab Results  Component Value Date   HGBA1C 6.8 (A) 08/20/2019   HGBA1C 6.8 08/20/2019   HGBA1C 6.8 (A) 08/20/2019   HGBA1C 6.8 08/20/2019   Lab Results  Component Value Date   VITAMINB12 308 02/14/2019   Lab Results  Component Value Date   TSH 2.230 11/13/2018      ASSESSMENT AND PLAN 44 y.o. year old female  has a past medical history of Allergy, Anemia, Anxiety (01/2019), Asthma, Common migraine with intractable migraine (06/28/2017), Diabetes ([place]) (02/2019), GERD (gastroesophageal reflux disease), Hypertension, IBS (irritable bowel syndrome) 19-Jun-2003), Neonatal death, Palpitations, Shortness of breath, Spinal headache, Valvular heart disease, and Vitamin D deficiency (02/2019). here with:  1.  Chronic migraine headache  -Will check on PA for Ajovy, will start once approved  -Continue Nurtec as abortive medication use -Maxalt was not helpful previously -Also taking nortriptyline, gabapentin, and Toprol XL -Follow-up in 6 months or sooner if needed  I spent 20 minutes of face-to-face and non-face-to-face time with patient.  This included previsit chart review, lab review, study  review, order entry, electronic health record documentation, patient education.  Butler Denmark, AGNP-C, DNP 12/20/2019, 4:19 PM [facility] [address], [place], [place] [address] [phone]

## 2019-12-21 NOTE — Progress Notes (Signed)
I have read the note, and I agree with the clinical assessment and plan.  Sharif Rendell K Garrett Bowring   

## 2019-12-24 MED FILL — NURTEC 75 MG TBDP: 75 | 30 days supply | Qty: 8 | Fill #0

## 2019-12-24 NOTE — Telephone Encounter (Signed)
Attemped to called the pharmacy and go no answer.  Will try to call again.

## 2019-12-24 NOTE — Telephone Encounter (Signed)
Spoke to [place] at [facility] re: OEHOZY 75mg   PA: DENIED 12/21/19  Betty Cain said she got a [employer] coupon and the cost is $0. Pt can pick up the medication tomorrow 12/25/19.  Pt was notified

## 2019-12-25 ENCOUNTER — Other Ambulatory Visit: Payer: Self-pay

## 2019-12-25 MED ORDER — AJOVY 225 MG/1.5ML ~~LOC~~ SOAJ: 225.0000 mg | SUBCUTANEOUS | 5 refills | Status: DC

## 2019-12-25 MED ORDER — AJOVY 225 MG/1.5ML ~~LOC~~ SOAJ: 225.0000 mg | SUBCUTANEOUS | 2 refills | Status: DC

## 2019-12-25 MED FILL — AJOVY 225 MG/1.5ML SOAJ: 225 | 30 days supply | Qty: 2 | Fill #0

## 2019-12-25 NOTE — Telephone Encounter (Signed)
Elbahi,Abdelrhman(husband) is asking for a call re: what Portia,CMA found out on yesterday when [facility] was contacted about pt's Ajovy.  Please call.

## 2020-01-02 ENCOUNTER — Other Ambulatory Visit: Payer: Self-pay

## 2020-01-02 ENCOUNTER — Ambulatory Visit (INDEPENDENT_AMBULATORY_CARE_PROVIDER_SITE_OTHER): Payer: Medicaid Other | Admitting: Nurse Practitioner

## 2020-01-02 ENCOUNTER — Ambulatory Visit (HOSPITAL_COMMUNITY)
Admission: RE | Admit: 2020-01-02 | Discharge: 2020-01-02 | Disposition: A | Discharge status: Home / Self Care | Payer: Medicaid Other | Source: Ambulatory Visit | Attending: Nurse Practitioner | Admitting: Nurse Practitioner

## 2020-01-02 ENCOUNTER — Other Ambulatory Visit: Payer: Self-pay | Admitting: Nurse Practitioner

## 2020-01-02 ENCOUNTER — Encounter: Payer: Self-pay | Admitting: Nurse Practitioner

## 2020-01-02 VITALS — BP 119/74 | HR 86 | Temp 97.4°F | Ht 59.0 in | Wt 156.0 lb

## 2020-01-02 DIAGNOSIS — M79672 Pain in left foot: Secondary | ICD-10-CM | POA: Diagnosis present

## 2020-01-02 DIAGNOSIS — M545 Low back pain, unspecified: Secondary | ICD-10-CM

## 2020-01-02 DIAGNOSIS — R110 Nausea: Secondary | ICD-10-CM

## 2020-01-02 DIAGNOSIS — E1165 Type 2 diabetes mellitus with hyperglycemia: Secondary | ICD-10-CM | POA: Diagnosis not present

## 2020-01-02 DIAGNOSIS — R1084 Generalized abdominal pain: Secondary | ICD-10-CM

## 2020-01-02 DIAGNOSIS — G8929 Other chronic pain: Secondary | ICD-10-CM

## 2020-01-02 DIAGNOSIS — R5383 Other fatigue: Secondary | ICD-10-CM | POA: Diagnosis not present

## 2020-01-02 DIAGNOSIS — M79671 Pain in right foot: Secondary | ICD-10-CM | POA: Insufficient documentation

## 2020-01-02 DIAGNOSIS — J4520 Mild intermittent asthma, uncomplicated: Secondary | ICD-10-CM

## 2020-01-02 LAB — POCT URINALYSIS DIPSTICK (MANUAL)
Leukocytes, UA: NEGATIVE
Nitrite, UA: NEGATIVE
Poct Bilirubin: NEGATIVE
Poct Blood: NEGATIVE
Poct Glucose: NORMAL mg/dL
Poct Ketones: NEGATIVE
Poct Protein: 30 mg/dL — AB
Poct Urobilinogen: NORMAL mg/dL
Spec Grav, UA: >=1.03 — AB (ref 1.010–1.025)
pH, UA: 6 (ref 5.0–8.0)

## 2020-01-02 LAB — POCT GLYCOSYLATED HEMOGLOBIN (HGB A1C)
HbA1c POC (<> result, manual entry): 7.5 % (ref 4.0–5.6)
Hemoglobin A1C: 7.5 % — AB (ref 4.0–5.6)

## 2020-01-02 LAB — POCT URINE PREGNANCY: Preg Test, Ur: NEGATIVE

## 2020-01-02 MED ORDER — MELOXICAM 15 MG PO TABS: 15.0000 mg | ORAL_TABLET | Freq: Every day | ORAL | 0 refills | Status: DC

## 2020-01-02 MED ORDER — ALBUTEROL SULFATE HFA 108 (90 BASE) MCG/ACT IN AERS: 2.0000 | INHALATION_SPRAY | Freq: Four times a day (QID) | RESPIRATORY_TRACT | 2 refills | Status: DC | PRN

## 2020-01-02 MED FILL — MELOXICAM 15 MG TABLET: 15 | 30 days supply | Qty: 30 | Fill #0

## 2020-01-02 MED FILL — ALBUTEROL SULFATE HFA 108 (: 108 (90 BAS | 25 days supply | Qty: 18 | Fill #0

## 2020-01-02 NOTE — Progress Notes (Signed)
[place], [place]  [address] Phone:  [phone]   Fax:  [phone]   Established Patient Office Visit  Subjective:  Patient ID: Betty Cain, female    DOB: 01-08-1976  Age: 44 y.o. MRN: [id number]  CC:  Chief Complaint  Patient presents with  . Headache    taking injection no help  . Foot Pain    bilateral heel pain, right heel is worser  . Other    NEEDS Nexplanon removed,  also when wake up in morning feels like someone has pushed her in chest also very tired when wake up.  also when wake up have right side facial pain  . Back Pain    low back pain  . Medication Management    feels like medicine is not helping  . Nausea    for a few months    HPI Betty Cain presents for follow. She  has a past medical history of Allergy, Anemia, Anxiety (01/2019), Asthma, B12 deficiency, Back pain, Common migraine with intractable migraine (06/28/2017), Constipation, Diabetes ([place]) (02/2019), Fatigue, GERD (gastroesophageal reflux disease), Hypertension, IBS (irritable bowel syndrome) 06/17/2003), Joint pain, Neonatal death, Palpitations, Shortness of breath, Shortness of breath on exertion, Spinal headache, Swelling of both lower extremities, Valvular heart disease, and Vitamin D deficiency (02/2019).   She is in today for follow up.  She has returned from her trip to [place].  She admits that she enjoyed herself.  She admits that while she was standing today in her diet may have not been the best.  She is aware that this has affected her diabetes.  Her A1c is slightly elevated 7.2% to 6.8%.  She denies any current concerns related to her diabetes.  She continues to take her Metformin 500 mg twice daily, glipizide 10 mg once daily. She is complaining of headache she is being followed by neurology. She is complaining of pressure to her chest on occasions.  She was recently seen by cardiologist. She is currently having back pain she is in physical  therapy. She is complaining of nausea she is currently being followed by gastroenterology.   Past Medical History:  Diagnosis Date  . Allergy   . Anemia   . Anxiety 01/2019  . Asthma   . B12 deficiency   . Back pain   . Common migraine with intractable migraine 06/28/2017  . Constipation   . Diabetes ([place]) 02/2019  . Fatigue   . GERD (gastroesophageal reflux disease)    pos H pylori  . Hypertension    controlled with medication  . IBS (irritable bowel syndrome) 17-Jun-2003  . Joint pain   . Neonatal death    Vaginal delivery, full term-lived x2 hours.   . Palpitations   . Shortness of breath   . Shortness of breath on exertion   . Spinal headache   . Swelling of both lower extremities   . Valvular heart disease   . Vitamin D deficiency 02/2019    Past Surgical History:  Procedure Laterality Date  . ADENOIDECTOMY     and tonsils (as a child)  . boil  06/16/01   right elbow  . CESAREAN SECTION     x4  . CESAREAN SECTION  06/02/2011   Procedure: CESAREAN SECTION;  Surgeon: [name], MD;  Location: [facility];  Service: Gynecology;  Laterality: N/A;  Primary Cesarean Section Delivery Baby Boy @ 0004, Apgars 9/9  . CESAREAN SECTION N/A 12/10/2013  Procedure: REPEAT CESAREAN SECTION;  Surgeon: [name], MD;  Location: [facility];  Service: Obstetrics;  Laterality: N/A;  . CESAREAN SECTION    . colonoscopy  11/23/2018   stated had issues with sleep when in for procedure  . OTHER SURGICAL HISTORY  2005   Uterine surgery   . uterine cauterization      Family History  Problem Relation Age of Onset  . Diabetes Mother   . Diabetes Father   . Diabetes Sister   . Anesthesia problems Neg Hx   . Colon cancer Neg Hx   . Esophageal cancer Neg Hx   . Rectal cancer Neg Hx   . Stomach cancer Neg Hx   . Colon polyps Neg Hx     Social History   Socioeconomic History  . Marital status: Married    Spouse name: [name]  . Number of children: 4  . Years of education: Not on  file  . Highest education level: Not on file  Occupational History  . Occupation: Unemployed  Tobacco Use  . Smoking status: Never Smoker  . Smokeless tobacco: Never Used  Vaping Use  . Vaping Use: Never used  Substance and Sexual Activity  . Alcohol use: No  . Drug use: No  . Sexual activity: Yes    Birth control/protection: None, Implant    Comment: pregnant  Other Topics Concern  . Not on file  Social History Narrative   Lives with husband and child   Caffeine use: daily (tea), sometimes coffee/soda   Right handed    Social Determinants of Health   Financial Resource Strain:   . Difficulty of Paying Living Expenses: Not on file  Food Insecurity:   . Worried About Charity fundraiser in the Last Year: Not on file  . Ran Out of Food in the Last Year: Not on file  Transportation Needs:   . Lack of Transportation (Medical): Not on file  . Lack of Transportation (Non-Medical): Not on file  Physical Activity:   . Days of Exercise per Week: Not on file  . Minutes of Exercise per Session: Not on file  Stress:   . Feeling of Stress : Not on file  Social Connections:   . Frequency of Communication with Friends and Family: Not on file  . Frequency of Social Gatherings with Friends and Family: Not on file  . Attends Religious Services: Not on file  . Active Member of Clubs or Organizations: Not on file  . Attends Archivist Meetings: Not on file  . Marital Status: Not on file  Intimate Partner Violence:   . Fear of Current or Ex-Partner: Not on file  . Emotionally Abused: Not on file  . Physically Abused: Not on file  . Sexually Abused: Not on file    Outpatient Medications Prior to Visit  Medication Sig Dispense Refill  . acetaminophen (TYLENOL) 500 MG tablet Take 500 mg by mouth every 6 (six) hours as needed.    . blood glucose meter kit and supplies Dispense based on patient and insurance preference. Use up to four times daily as directed. (FOR ICD-10 E10.9,  E11.9). 1 each 0  . Blood Pressure Monitoring DEVI 1 each by Does not apply route daily. 1 Device 0  . etonogestrel (NEXPLANON) 68 MG IMPL implant 1 each by Subdermal route once.    . ferrous sulfate (FEROSUL) 325 (65 FE) MG tablet TAKE 1 TABLET (325 MG TOTAL) BY MOUTH DAILY. 90 tablet 1  .  fluticasone (FLONASE) 50 MCG/ACT nasal spray PLACE 2 SPRAYS INTO BOTH NOSTRILS DAILY. 16 g 6  . fluticasone furoate-vilanterol (BREO ELLIPTA) 100-25 MCG/INH AEPB INHALE 1 PUFF INTO THE LUNGS DAILY. 60 each 3  . Fremanezumab-vfrm (AJOVY) 225 MG/1.5ML SOAJ Inject 225 mg into the skin every 30 (thirty) days. 1.5 mL 5  . gabapentin (NEURONTIN) 300 MG capsule 1 cap am and 2 QHs 270 capsule 1  . glucose blood (FREESTYLE LITE) test strip Use as instructed 100 each 12  . hydrocortisone (ANUSOL-HC) 2.5 % rectal cream Place 1 application rectally 2 (two) times daily. 30 g 1  . metFORMIN (GLUCOPHAGE) 500 MG tablet Take 1 tablet (500 mg total) by mouth 2 (two) times daily with a meal. 180 tablet 3  . methylcellulose (CITRUCEL) oral powder Take as directed, daily    . metoprolol succinate (TOPROL XL) 100 MG 24 hr tablet Take 1 tablet (100 mg total) by mouth daily. Take with or immediately following a meal. 90 tablet 3  . Multiple Vitamin (MULTI VITAMIN DAILY PO) Take by mouth.    . ondansetron (ZOFRAN-ODT) 8 MG disintegrating tablet TAKE 1 TABLET BY MOUTH EVERY 8 HOURS AS NEEDED FOR NAUSEA OR VOMITING. 30 tablet 0  . pantoprazole (PROTONIX) 40 MG tablet TAKE 1 TABLET BY MOUTH 2 TIMES DAILY BEFORE A MEAL. 180 tablet 1  . potassium chloride 20 MEQ/15ML (10%) SOLN Take 15 mLs (20 mEq total) by mouth daily. 473 mL 2  . Rimegepant Sulfate (NURTEC) 75 MG TBDP Take 1 tablet at the onset of migraine. 30 tablet 1  . rizatriptan (MAXALT) 10 MG tablet Take 1 tablet (10 mg total) by mouth 3 (three) times daily as needed for migraine. May repeat in 2 hours if needed 10 tablet 3  . rosuvastatin (CRESTOR) 5 MG tablet Take 1 tablet (5 mg  total) by mouth at bedtime. 30 tablet 11  . albuterol (VENTOLIN HFA) 108 (90 Base) MCG/ACT inhaler INHALE 2 PUFFS INTO THE LUNGS EVERY 6 (SIX) HOURS AS NEEDED FOR WHEEZING OR SHORTNESS OF BREATH. 18 g 2  . Fremanezumab-vfrm (AJOVY) 225 MG/1.5ML SOAJ Inject 225 mg into the skin every 30 (thirty) days. 225 mL 2  . TRUEplus Lancets 28G MISC USE AS DIRECTED UP TO 4 TIMES DAILY 100 each 3  . dicyclomine (BENTYL) 10 MG/5ML solution Take 5 mLs (10 mg total) by mouth 4 (four) times daily -  before meals and at bedtime. 600 mL 2  . glipiZIDE (GLUCOTROL) 10 MG tablet Take 1 tablet (10 mg total) by mouth 2 (two) times daily before a meal. 180 tablet 0  . levocetirizine (XYZAL) 5 MG tablet Take 1 tablet (5 mg total) by mouth every evening. 90 tablet 0  . nortriptyline (PAMELOR) 25 MG capsule Take 1 capsule (25 mg total) by mouth at bedtime. 90 capsule 0   No facility-administered medications prior to visit.    Allergies  Allergen Reactions  . Topamax [Topiramate] Itching and Rash    ROS Review of Systems  Gastrointestinal: Positive for nausea.  Musculoskeletal: Positive for back pain (getting worse) and joint swelling (getting worse).  Neurological: Positive for headaches (She has declined he Botox).      Objective:    Physical Exam HENT:     Head: Normocephalic and atraumatic.     Nose: Nose normal.     Mouth/Throat:     Mouth: Mucous membranes are moist.  Cardiovascular:     Rate and Rhythm: Normal rate.     Heart sounds:  Normal heart sounds.  Pulmonary:     Effort: Pulmonary effort is normal.     Breath sounds: Normal breath sounds.  Abdominal:     General: Bowel sounds are normal.     Palpations: Abdomen is soft.  Musculoskeletal:        General: Normal range of motion.  Skin:    General: Skin is warm and dry.  Neurological:     General: No focal deficit present.     Mental Status: She is alert and oriented to person, place, and time.  Psychiatric:        Mood and Affect:  Mood normal.        Behavior: Behavior normal.        Thought Content: Thought content normal.        Judgment: Judgment normal.     Comments: Cheerful and happy smiling during entire visit     BP 119/74   Pulse 86   Temp (!) 97.4 F (36.3 C) (Temporal)   Ht 4' 11"  (1.499 m)   Wt 156 lb (70.8 kg)   SpO2 100%   BMI 31.51 kg/m  Wt Readings from Last 3 Encounters:  01/10/20 152 lb 12.8 oz (69.3 kg)  01/02/20 156 lb (70.8 kg)  12/20/19 154 lb (69.9 kg)     Health Maintenance Due  Topic Date Due  . Hepatitis C Screening  Never done  . PNEUMOCOCCAL POLYSACCHARIDE VACCINE AGE 42-64 HIGH RISK  Never done  . FOOT EXAM  Never done  . OPHTHALMOLOGY EXAM  Never done  . PAP SMEAR-Modifier  06/19/2016    There are no preventive care reminders to display for this patient.  Lab Results  Component Value Date   TSH 3.160 01/10/2020   Lab Results  Component Value Date   WBC 7.4 01/10/2020   HGB 10.9 (L) 01/10/2020   HCT 34.4 01/10/2020   MCV 79 01/10/2020   PLT 442 01/10/2020   Lab Results  Component Value Date   NA 139 01/02/2020   K 4.6 01/02/2020   CO2 21 05/21/2019   GLUCOSE 196 (H) 01/02/2020   BUN 10 01/02/2020   CREATININE 0.66 01/02/2020   BILITOT 0.2 01/02/2020   ALKPHOS 97 01/02/2020   AST 13 01/02/2020   ALT 14 05/21/2019   PROT 7.6 01/02/2020   ALBUMIN 4.6 01/02/2020   CALCIUM 9.8 01/02/2020   ANIONGAP 11 08/30/2018   GFR 119.42 08/28/2018   Lab Results  Component Value Date   CHOL 154 01/10/2020   Lab Results  Component Value Date   HDL 88 01/10/2020   Lab Results  Component Value Date   LDLCALC 53 01/10/2020   Lab Results  Component Value Date   TRIG 65 01/10/2020   Lab Results  Component Value Date   CHOLHDL 3.6 08/20/2019   Lab Results  Component Value Date   HGBA1C 7.5 (A) 01/02/2020   HGBA1C 7.5 01/02/2020      Assessment & Plan:   Problem List Items Addressed This Visit      Endocrine   Type 2 diabetes mellitus with  hyperglycemia, without long-term current use of insulin (HCC) - Primary Encourage compliance with current treatment regimen no dose adjustments at this time Encourage regular CBG monitoring Encourage contacting office if excessive hyperglycemia and or hypoglycemia Lifestyle modification with healthy diet (fewer calories, more high fiber foods, whole grains and non-starchy vegetables, lower fat meat and fish, low-fat diary include healthy oils) regular exercise (physical activity) and weight loss Opthalmology  exam discussed  Nutritional consult recommended she is following up with weight loss clinic Regular dental visits encouraged Home BP monitoring also encouraged goal <130/80     Relevant Orders   POCT HgB A1C (Completed)   POCT Urinalysis Dip Manual (Completed)   Comp. Metabolic Panel (12) (Completed)     Other   Low back pain Continue to encourage physical therapy and regular exercise at home including stretching   Relevant Medications   meloxicam (MOBIC) 15 MG tablet   Nausea Continue with current regimen if symptoms persist encourage patient to follow-up with gastroenterology    Other Visit Diagnoses    Fatigue, unspecified type       Relevant Orders   POCT urine pregnancy (Completed)   Heel pain, bilateral       Relevant Orders   DG Foot Complete Left (Completed)   DG Foot Complete Right (Completed)   Mild intermittent asthma without complication       Relevant Medications   albuterol (VENTOLIN HFA) 108 (90 Base) MCG/ACT inhaler   Chronic generalized abdominal pain       Relevant Medications   Continue to follow-up with gastroenterology as scheduled      Meds ordered this encounter  Medications  . albuterol (VENTOLIN HFA) 108 (90 Base) MCG/ACT inhaler    Sig: Inhale 2 puffs into the lungs every 6 (six) hours as needed for wheezing or shortness of breath.    Dispense:  8 g    Refill:  2    Order Specific Question:   Supervising Provider    Answer:   Tresa Garter [id number]  . meloxicam (MOBIC) 15 MG tablet    Sig: Take 1 tablet (15 mg total) by mouth daily.    Dispense:  30 tablet    Refill:  0    Order Specific Question:   Supervising Provider    Answer:   Tresa Garter [id number]    Follow-up: Return in about 3 months (around 04/02/2020) for ON A THURSDAY ONLY.    Vevelyn Francois, NP

## 2020-01-03 ENCOUNTER — Other Ambulatory Visit: Payer: Self-pay | Admitting: Nurse Practitioner

## 2020-01-03 ENCOUNTER — Other Ambulatory Visit: Payer: Self-pay

## 2020-01-03 DIAGNOSIS — J3089 Other allergic rhinitis: Secondary | ICD-10-CM

## 2020-01-03 DIAGNOSIS — J4520 Mild intermittent asthma, uncomplicated: Secondary | ICD-10-CM

## 2020-01-03 DIAGNOSIS — E1165 Type 2 diabetes mellitus with hyperglycemia: Secondary | ICD-10-CM

## 2020-01-03 DIAGNOSIS — D649 Anemia, unspecified: Secondary | ICD-10-CM

## 2020-01-03 DIAGNOSIS — F419 Anxiety disorder, unspecified: Secondary | ICD-10-CM

## 2020-01-03 LAB — COMP. METABOLIC PANEL (12)
AST: 13 IU/L (ref 0–40)
Albumin/Globulin Ratio: 1.5 (ref 1.2–2.2)
Albumin: 4.6 g/dL (ref 3.8–4.8)
Alkaline Phosphatase: 97 IU/L (ref 44–121)
BUN/Creatinine Ratio: 15 (ref 9–23)
BUN: 10 mg/dL (ref 6–24)
Bilirubin Total: 0.2 mg/dL (ref 0.0–1.2)
Calcium: 9.8 mg/dL (ref 8.7–10.2)
Chloride: 102 mmol/L (ref 96–106)
Creatinine, Ser: 0.66 mg/dL (ref 0.57–1.00)
GFR calc Af Amer: 126 mL/min/{1.73_m2} (ref >59)
GFR calc non Af Amer: 109 mL/min/{1.73_m2} (ref >59)
Globulin, Total: 3 g/dL (ref 1.5–4.5)
Glucose: 196 mg/dL — ABNORMAL HIGH (ref 65–99)
Potassium: 4.6 mmol/L (ref 3.5–5.2)
Sodium: 139 mmol/L (ref 134–144)
Total Protein: 7.6 g/dL (ref 6.0–8.5)

## 2020-01-03 MED ORDER — LEVOCETIRIZINE DIHYDROCHLORIDE 5 MG PO TABS: 5.0000 mg | ORAL_TABLET | Freq: Every evening | ORAL | 0 refills | Status: DC

## 2020-01-03 MED ORDER — NORTRIPTYLINE HCL 25 MG PO CAPS: 25.0000 mg | ORAL_CAPSULE | Freq: Every day | ORAL | 0 refills | Status: DC

## 2020-01-03 MED FILL — METOPROLOL SUCCINATE ER 100: 100 | 90 days supply | Qty: 90 | Fill #1

## 2020-01-03 MED FILL — FERROUS SULFATE 325 MG TAB: 325 (65 FE) | 90 days supply | Qty: 90 | Fill #1

## 2020-01-03 MED FILL — BREO ELLIPTA 100-25 MCG INH: 100-25 | 30 days supply | Qty: 60 | Fill #1

## 2020-01-03 MED FILL — PANTOPRAZOLE SOD DR 40 MG T: 40 | 90 days supply | Qty: 180 | Fill #1

## 2020-01-03 MED FILL — ROSUVASTATIN CALCIUM 5 MG T: 5 | 60 days supply | Qty: 60 | Fill #6

## 2020-01-03 MED FILL — NORTRIPTYLINE HCL 25 MG CAP: 25 | 90 days supply | Qty: 90 | Fill #0

## 2020-01-03 MED FILL — GABAPENTIN 300 MG CAPSULE: 300 | 90 days supply | Qty: 270 | Fill #1

## 2020-01-03 MED FILL — METFORMIN HCL 500 MG TABS: 500 | 90 days supply | Qty: 180 | Fill #1

## 2020-01-03 MED FILL — LEVOCETIRIZINE 5 MG TABLET: 5 | 90 days supply | Qty: 90 | Fill #0

## 2020-01-04 ENCOUNTER — Other Ambulatory Visit: Payer: Self-pay

## 2020-01-04 DIAGNOSIS — E1165 Type 2 diabetes mellitus with hyperglycemia: Secondary | ICD-10-CM

## 2020-01-04 MED ORDER — GLIPIZIDE 10 MG PO TABS: 10.0000 mg | ORAL_TABLET | Freq: Two times a day (BID) | ORAL | 1 refills | Status: DC

## 2020-01-04 MED ORDER — TRUEPLUS LANCETS 28G MISC: 3 refills | Status: DC

## 2020-01-04 MED FILL — glipiZIDE 10 MG TABS: 10 | 30 days supply | Qty: 60 | Fill #0

## 2020-01-07 ENCOUNTER — Ambulatory Visit (INDEPENDENT_AMBULATORY_CARE_PROVIDER_SITE_OTHER): Payer: Medicaid Other | Admitting: Family Medicine

## 2020-01-09 ENCOUNTER — Telehealth: Payer: Self-pay | Admitting: Nurse Practitioner

## 2020-01-09 DIAGNOSIS — R002 Palpitations: Secondary | ICD-10-CM

## 2020-01-09 NOTE — Telephone Encounter (Signed)
Used [employer] [phone] to help speak with the pt.  For the last 2-3 nights, pt has been waking up during the night with her heart racing.  HRs are 100-113.  Episodes last 15 mins and resolve.  Lightheadedness and dizziness occur with episodes.  Does not feel heart racing during the day but states "heart beats hard" all the time.  Episodes have been occurring for awhile but have become more intense and more frequent.  Currently takes Metoprolol Succinate 100mg  QD.  Only other issue pt is having currently is a chronic migraine but racing feeling occurs even when she's not having migraines.  Advised I will send to Dr. Tamala Julian for review.

## 2020-01-09 NOTE — Telephone Encounter (Signed)
Pt c/o medication issue:  1. Name of Medication: metoprolol succinate (TOPROL XL) 100 MG 24 hr tablet  2. How are you currently taking this medication (dosage and times per day)? 1 tablet daily  3. Are you having a reaction (difficulty breathing--STAT)? no  4. What is your medication issue? Patient is requesting the medication be increasing, because she states she sometimes wakes up with her heart racing.

## 2020-01-10 ENCOUNTER — Encounter (INDEPENDENT_AMBULATORY_CARE_PROVIDER_SITE_OTHER): Payer: Self-pay | Admitting: Family Medicine

## 2020-01-10 ENCOUNTER — Other Ambulatory Visit: Payer: Self-pay

## 2020-01-10 ENCOUNTER — Ambulatory Visit (INDEPENDENT_AMBULATORY_CARE_PROVIDER_SITE_OTHER): Payer: 59 | Admitting: Family Medicine

## 2020-01-10 VITALS — BP 111/74 | HR 89 | Temp 98.6°F | Ht <= 58 in | Wt 152.8 lb

## 2020-01-10 DIAGNOSIS — R0602 Shortness of breath: Secondary | ICD-10-CM | POA: Diagnosis not present

## 2020-01-10 DIAGNOSIS — F3289 Other specified depressive episodes: Secondary | ICD-10-CM

## 2020-01-10 DIAGNOSIS — E785 Hyperlipidemia, unspecified: Secondary | ICD-10-CM

## 2020-01-10 DIAGNOSIS — E1140 Type 2 diabetes mellitus with diabetic neuropathy, unspecified: Secondary | ICD-10-CM | POA: Insufficient documentation

## 2020-01-10 DIAGNOSIS — E1169 Type 2 diabetes mellitus with other specified complication: Secondary | ICD-10-CM | POA: Diagnosis not present

## 2020-01-10 DIAGNOSIS — F329 Major depressive disorder, single episode, unspecified: Secondary | ICD-10-CM | POA: Diagnosis not present

## 2020-01-10 DIAGNOSIS — E559 Vitamin D deficiency, unspecified: Secondary | ICD-10-CM

## 2020-01-10 DIAGNOSIS — Z9189 Other specified personal risk factors, not elsewhere classified: Secondary | ICD-10-CM

## 2020-01-10 DIAGNOSIS — R5383 Other fatigue: Secondary | ICD-10-CM

## 2020-01-10 DIAGNOSIS — Z0289 Encounter for other administrative examinations: Secondary | ICD-10-CM

## 2020-01-10 DIAGNOSIS — E1142 Type 2 diabetes mellitus with diabetic polyneuropathy: Secondary | ICD-10-CM

## 2020-01-10 DIAGNOSIS — E1159 Type 2 diabetes mellitus with other circulatory complications: Secondary | ICD-10-CM | POA: Diagnosis not present

## 2020-01-10 DIAGNOSIS — E669 Obesity, unspecified: Secondary | ICD-10-CM

## 2020-01-10 DIAGNOSIS — Z6831 Body mass index (BMI) 31.0-31.9, adult: Secondary | ICD-10-CM

## 2020-01-10 DIAGNOSIS — I10 Essential (primary) hypertension: Secondary | ICD-10-CM

## 2020-01-10 NOTE — Progress Notes (Signed)
Dear Dr. Havery Moros,   Thank you for referring Betty Cain to our clinic. The following note includes my evaluation and treatment recommendations.  Chief Complaint:   OBESITY Betty Cain (MR# [id number]) is a 44 y.o. female who presents for evaluation and treatment of obesity and related comorbidities. Current BMI is Body mass index is 31.94 kg/m. [name] has been struggling with her weight for many years and has been unsuccessful in either losing weight, maintaining weight loss, or reaching her healthy weight goal.  [name] is currently in the action stage of change and ready to dedicate time achieving and maintaining a healthier weight. [name] is interested in becoming our patient and working on intensive lifestyle modifications including (but not limited to) diet and exercise for weight loss.  [name] lives with her husband and 4 children.  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  Occasionally skips breakfast.  Craves desserts and cheese.  She had an interpreter present with her today.  [name]'s habits were reviewed today and are as follows: Her family eats meals together, she has been heavy most of her life, she has significant food cravings issues, she skips meals frequently, she is frequently drinking liquids with calories, she frequently makes poor food choices, she has problems with excessive hunger, she frequently eats larger portions than normal and she struggles with emotional eating.  Depression Screen Al's Food and Mood (modified PHQ-9) score was 11.  Depression screen PHQ 2/9 01/10/2020  Decreased Interest 2  Down, Depressed, Hopeless 3  PHQ - 2 Score 5  Altered sleeping 0  Tired, decreased energy 3  Change in appetite 0  Feeling bad or failure about yourself  0  Trouble concentrating 1  Moving slowly or fidgety/restless 1  Suicidal thoughts 0  PHQ-9 Score 10  Some recent data might be hidden   Subjective:   1. Shortness of breath Betty Cain notes increasing shortness of breath with  exercising and seems to be worsening over time with weight gain. She notes getting out of breath sooner with activity than she used to. This has gotten worse recently. [name] denies shortness of breath at rest or orthopnea.  2. Other fatigue Betty Cain denies daytime somnolence and reports waking up still tired. Patent has a history of symptoms of morning fatigue, morning headache and snoring. Betty Cain generally gets 8 hours of sleep per night, and states that she has poor quality sleep. Snoring is present. Apneic episodes are not present. Epworth Sleepiness Score is 5.  3. Type 2 diabetes mellitus with other specified complication, without long-term current use of insulin (HCC) Medications reviewed. Diabetic ROS: no polyuria or polydipsia, no chest pain, dyspnea or TIA's, no numbness, tingling or pain in extremities.  She is on metformin and glipizide.  Treatment by PCP.  A1c was 7.5 on 01/02/2020.  Lab Results  Component Value Date   HGBA1C 7.5 (A) 01/02/2020   HGBA1C 7.5 01/02/2020   HGBA1C 6.8 (A) 08/20/2019   HGBA1C 6.8 08/20/2019   HGBA1C 6.8 (A) 08/20/2019   HGBA1C 6.8 08/20/2019   Lab Results  Component Value Date   LDLCALC 53 01/10/2020   CREATININE 0.66 01/02/2020   4. Hyperlipidemia associated with type 2 diabetes mellitus ([place]) Betty Cain has hyperlipidemia and has been trying to improve her cholesterol levels with intensive lifestyle modification including a low saturated fat diet, exercise and weight loss. She denies any chest pain, claudication or myalgias.  She is on Crestor.  She sees Cardiology once yearly.  She had tachycardia and that is how  she is hooked in with the cardiology team.  Lab Results  Component Value Date   ALT 14 05/21/2019   AST 13 01/02/2020   ALKPHOS 97 01/02/2020   BILITOT 0.2 01/02/2020   Lab Results  Component Value Date   CHOL 154 01/10/2020   HDL 88 01/10/2020   LDLCALC 53 01/10/2020   TRIG 65 01/10/2020   CHOLHDL 3.6 08/20/2019   5. Hypertension  associated with diabetes ([place]) Review: taking medications as instructed, no medication side effects noted, no chest pain on exertion, no dyspnea on exertion, no swelling of ankles.  She is on Toprol-XL.  History of tachycardia.  Had negative workup by Cardiology.  Denies acute concerns today.  BP Readings from Last 3 Encounters:  01/10/20 111/74  01/02/20 119/74  12/20/19 113/75   6. Diabetic peripheral neuropathy associated with type 2 diabetes mellitus ([place]) She is taking gabapentin per Neurology.  7. Vitamin D deficiency She is currently taking no vitamin D supplement. She denies nausea, vomiting or muscle weakness.  8. Other depression, with emotional eating Positive PHQ-9 of 11.  Denies SI.  Denies depressed emotions.  Appears happy and well adjusted.  9. At risk for heart disease Due to Betty Cain's current state of health and medical condition(s), she is at a higher risk for heart disease.   This puts the patient at much greater risk to subsequently develop cardiopulmonary conditions that can significantly affect patient's quality of life in a negative manner as well.    At least 15+ minutes was spent on counseling Betty Cain about these concerns today and I stressed the importance of reversing risks factors of obesity, esp truncal and visceral fat, hypertension, hyperlipidemia, pre-diabetes.   Initial goal is to lose at least 5-10% of starting weight to help reduce these risk factors.   Counseling: Intensive lifestyle modifications discussed with [name] as most appropriate first line treatment.  she will continue to work on diet, exercise and weight loss efforts.  We will continue to reassess these conditions on a fairly regular basis in an attempt to decrease patient's overall morbidity and mortality.  Evidence-based interventions for health behavior change were utilized today including the discussion of self monitoring techniques, problem-solving barriers and SMART goal setting techniques.   Specifically regarding patient's less desirable eating habits and patterns, we employed the technique of small changes when Betty Cain has not been able to fully commit to her prudent nutritional plan.  This is the patient's first visit at [facility].  The patient's NEW PATIENT PACKET that they filled out prior to today's office visit was reviewed at length and some information from that paperwork was also included within the following office visit note.    Included in the packet: current and past health history, medications, allergies, ROS, gynecologic history (women only), surgical history, family history, social history, weight history, weight loss surgery history (for those that have had weight loss surgery), nutritional evaluation, mood and food questionnaire along with a depression screening (PHQ9) on all patients, an Epworth questionnaire, sleep habits questionnaire, patient life and health improvement goals questionnaire. These will all be scanned into the patient's chart under media.   During the visit, I independently reviewed the patient's EKG, bioimpedance scale results, and indirect calorimeter results. I used this information to tailor a meal plan for the patient that will help Jazira to lose weight and will improve her obesity-related conditions going forward. I performed a medically necessary appropriate examination and/or evaluation. I discussed the assessment and treatment plan  with the patient. The patient was provided an opportunity to ask questions and all were answered. The patient agreed with the plan and demonstrated an understanding of the instructions. Labs were ordered at this visit and will be reviewed at the next visit unless more critical results need to be addressed immediately. Clinical information was updated and documented in the EMR.   Time spent on visit including pre-visit chart review and post-visit care was estimated to be 60-74 minutes.  A separate 15 minutes  was spent on risk counseling (see above/below).   Assessment/Plan:   1. Other fatigue Ilisha does feel that her weight is causing her energy to be lower than it should be. Fatigue may be related to obesity, depression or many other causes. Labs will be ordered, and in the meanwhile, Konstance will focus on self care including making healthy food choices, increasing physical activity and focusing on stress reduction.  - Vitamin B12 - CBC with Differential/Platelet - Insulin, random - T3 - T4 - TSH - VITAMIN D 25 Hydroxy (Vit-D Deficiency, Fractures)  2. Shortness of breath Vennesa does feel that she gets out of breath more easily that she used to when she exercises. Nguyen's shortness of breath appears to be obesity related and exercise induced. She has agreed to work on weight loss and gradually increase exercise to treat her exercise induced shortness of breath. Will continue to monitor closely.  - Vitamin B12 - CBC with Differential/Platelet - Insulin, random - T3 - T4 - TSH  3. Type 2 diabetes mellitus with other specified complication, without long-term current use of insulin (HCC) Good blood sugar control is important to decrease the likelihood of diabetic complications such as nephropathy, neuropathy, limb loss, blindness, coronary artery disease, and death. Intensive lifestyle modification including diet, exercise and weight loss are the first line of treatment for diabetes.  A1c not at goal of <7.0.  Prudent nutritional plan, decrease simple carbs, adequate proteins and water intake, home blood sugar monitoring and medications per PCP.  Check labs.  4. Hyperlipidemia associated with type 2 diabetes mellitus ([place]) Cardiovascular risk and specific lipid/LDL goals reviewed.  We discussed several lifestyle modifications today and Miaisabella will continue to work on diet, exercise and weight loss efforts. Orders and follow up as documented in patient record.  Continue medications as written.  Check  labs.  Low sat/trans fat diet.  Counseling Intensive lifestyle modifications are the first line treatment for this issue. . Dietary changes: Increase soluble fiber. Decrease simple carbohydrates. . Exercise changes: Moderate to vigorous-intensity aerobic activity 150 minutes per week if tolerated. . Lipid-lowering medications: see documented in medical record.  - Lipid Panel With LDL/HDL Ratio  5. Hypertension associated with diabetes ([place]) Review: taking medications as instructed, no medication side effects noted, no chest pain on exertion, no dyspnea on exertion, no swelling of ankles.  Blood pressure at goal, pulse 89.  Close monitoring as she loses weight.  BP Readings from Last 3 Encounters:  01/10/20 111/74  01/02/20 119/74  12/20/19 113/75   6. Diabetic peripheral neuropathy associated with type 2 diabetes mellitus (HCC) Check B12, check labs, continue treatment per Neurology. - Vitamin B12  7. Vitamin D deficiency Low Vitamin D level contributes to fatigue and are associated with obesity, breast, and colon cancer.   - VITAMIN D 25 Hydroxy (Vit-D Deficiency, Fractures)  8. Other depression, with emotional eating Will follow closely.  9. At risk for heart disease Due to Kimley's current state of health and medical condition(s),  she is at a higher risk for heart disease.   This puts the patient at much greater risk to subsequently develop cardiopulmonary conditions that can significantly affect patient's quality of life in a negative manner as well.    At least 15+ minutes was spent on counseling Jakyia about these concerns today and I stressed the importance of reversing risks factors of obesity, esp truncal and visceral fat, hypertension, hyperlipidemia, pre-diabetes.   Initial goal is to lose at least 5-10% of starting weight to help reduce these risk factors.   Counseling: Intensive lifestyle modifications discussed with Keyarra as most appropriate first line treatment.  she will  continue to work on diet, exercise and weight loss efforts.  We will continue to reassess these conditions on a fairly regular basis in an attempt to decrease patient's overall morbidity and mortality.  Evidence-based interventions for health behavior change were utilized today including the discussion of self monitoring techniques, problem-solving barriers and SMART goal setting techniques.  Specifically regarding patient's less desirable eating habits and patterns, we employed the technique of small changes when Docia has not been able to fully commit to her prudent nutritional plan.  10. Class 1 obesity with serious comorbidity and body mass index (BMI) of 31.0 to 31.9 in adult, unspecified obesity type  Madyson is currently in the action stage of change and her goal is to continue with weight loss efforts. I recommend Dannetta begin the structured treatment plan as follows:  She has agreed to the Category 1 Plan.  Exercise goals: As is.   Behavioral modification strategies: increasing lean protein intake, decreasing simple carbohydrates, no skipping meals, meal planning and cooking strategies, keeping healthy foods in the home and planning for success.  She was informed of the importance of frequent follow-up visits to maximize her success with intensive lifestyle modifications for her multiple health conditions. She was informed we would discuss her lab results at her next visit unless there is a critical issue that needs to be addressed sooner. Takeela agreed to keep her next visit at the agreed upon time to discuss these results.  Objective:   Blood pressure 111/74, pulse 89, temperature 98.6 F (37 C), height 4\' 10"  (1.473 m), weight 152 lb 12.8 oz (69.3 kg), SpO2 99 %. Body mass index is 31.94 kg/m.  Indirect Calorimeter completed today shows a VO2 of 218 and a REE of 1515.  Her calculated basal metabolic rate is 9983 thus her basal metabolic rate is better than expected.  General: Cooperative,  alert, well developed, in no acute distress. HEENT: Conjunctivae and lids unremarkable. Cardiovascular: Regular rhythm.  Lungs: Normal work of breathing. Neurologic: No focal deficits.   Lab Results  Component Value Date   CREATININE 0.66 01/02/2020   BUN 10 01/02/2020   NA 139 01/02/2020   K 4.6 01/02/2020   CL 102 01/02/2020   CO2 21 05/21/2019   Lab Results  Component Value Date   ALT 14 05/21/2019   AST 13 01/02/2020   ALKPHOS 97 01/02/2020   BILITOT 0.2 01/02/2020   Lab Results  Component Value Date   HGBA1C 7.5 (A) 01/02/2020   HGBA1C 7.5 01/02/2020   HGBA1C 6.8 (A) 08/20/2019   HGBA1C 6.8 08/20/2019   HGBA1C 6.8 (A) 08/20/2019   HGBA1C 6.8 08/20/2019   Lab Results  Component Value Date   TSH 3.160 01/10/2020   Lab Results  Component Value Date   CHOL 154 01/10/2020   HDL 88 01/10/2020   LDLCALC 53 01/10/2020  TRIG 65 01/10/2020   CHOLHDL 3.6 08/20/2019   Lab Results  Component Value Date   WBC 7.4 01/10/2020   HGB 10.9 (L) 01/10/2020   HCT 34.4 01/10/2020   MCV 79 01/10/2020   PLT 442 01/10/2020   Lab Results  Component Value Date   IRON 53 01/20/2018   TIBC 317 01/20/2018   FERRITIN 61 01/20/2018   Attestation Statements:   Reviewed by clinician on day of visit: allergies, medications, problem list, medical history, surgical history, family history, social history, and previous encounter notes.  I, Water quality scientist, CMA, am acting as Location manager for [employer], DO.  I have reviewed the above documentation for accuracy and completeness, and I agree with the above. Mellody Dance, DO

## 2020-01-11 ENCOUNTER — Other Ambulatory Visit: Payer: Self-pay | Admitting: Nurse Practitioner

## 2020-01-11 LAB — CBC WITH DIFFERENTIAL/PLATELET
Basophils Absolute: 0.1 10*3/uL (ref 0.0–0.2)
Basos: 1 %
EOS (ABSOLUTE): 0.1 10*3/uL (ref 0.0–0.4)
Eos: 2 %
Hematocrit: 34.4 % (ref 34.0–46.6)
Hemoglobin: 10.9 g/dL — ABNORMAL LOW (ref 11.1–15.9)
Immature Grans (Abs): 0 10*3/uL (ref 0.0–0.1)
Immature Granulocytes: 0 %
Lymphocytes Absolute: 3.1 10*3/uL (ref 0.7–3.1)
Lymphs: 42 %
MCH: 25.1 pg — ABNORMAL LOW (ref 26.6–33.0)
MCHC: 31.7 g/dL (ref 31.5–35.7)
MCV: 79 fL (ref 79–97)
Monocytes Absolute: 0.3 10*3/uL (ref 0.1–0.9)
Monocytes: 4 %
Neutrophils Absolute: 3.9 10*3/uL (ref 1.4–7.0)
Neutrophils: 51 %
Platelets: 442 10*3/uL (ref 150–450)
RBC: 4.34 x10E6/uL (ref 3.77–5.28)
RDW: 14.7 % (ref 11.7–15.4)
WBC: 7.4 10*3/uL (ref 3.4–10.8)

## 2020-01-11 LAB — LIPID PANEL WITH LDL/HDL RATIO
Cholesterol, Total: 154 mg/dL (ref 100–199)
HDL: 88 mg/dL (ref >39)
LDL Chol Calc (NIH): 53 mg/dL (ref 0–99)
LDL/HDL Ratio: 0.6 ratio (ref 0.0–3.2)
Triglycerides: 65 mg/dL (ref 0–149)
VLDL Cholesterol Cal: 13 mg/dL (ref 5–40)

## 2020-01-11 LAB — TSH: TSH: 3.16 u[IU]/mL (ref 0.450–4.500)

## 2020-01-11 LAB — T4: T4, Total: 6.6 ug/dL (ref 4.5–12.0)

## 2020-01-11 LAB — INSULIN, RANDOM: INSULIN: 8.7 u[IU]/mL (ref 2.6–24.9)

## 2020-01-11 LAB — T3: T3, Total: 94 ng/dL (ref 71–180)

## 2020-01-11 LAB — VITAMIN B12: Vitamin B-12: 344 pg/mL (ref 232–1245)

## 2020-01-11 LAB — VITAMIN D 25 HYDROXY (VIT D DEFICIENCY, FRACTURES): Vit D, 25-Hydroxy: 22.9 ng/mL — ABNORMAL LOW (ref 30.0–100.0)

## 2020-01-11 MED ORDER — ONETOUCH VERIO IQ SYSTEM W/DEVICE KIT: 1.0000 "application " | PACK | Freq: Four times a day (QID) | 0 refills | Status: DC

## 2020-01-11 MED ORDER — ONETOUCH DELICA LANCETS 33G MISC: 1.0000 "application " | Freq: Four times a day (QID) | 11 refills | Status: DC

## 2020-01-11 MED ORDER — GLUCOSE BLOOD VI STRP: ORAL_STRIP | 12 refills | Status: DC

## 2020-01-11 MED FILL — ONE TOUCH VERIO TEST STRIP: 30 days supply | Qty: 100 | Fill #0

## 2020-01-11 MED FILL — ONETOUCH VERIO REFLECT W/DE: W/DEVICE | 30 days supply | Qty: 1 | Fill #0

## 2020-01-11 MED FILL — ONETOUCH DELICA PLUS LANCET: 30 days supply | Qty: 100 | Fill #0

## 2020-01-15 NOTE — Telephone Encounter (Signed)
Needs to wear a 30 day monitor

## 2020-01-16 ENCOUNTER — Encounter: Payer: Self-pay | Admitting: *Deleted

## 2020-01-16 NOTE — Telephone Encounter (Signed)
Spoke with pt using interpreter [phone].  Reviewed recommendations per Dr. Tamala Julian.  Pt agreeable to wear monitor.  Reviewed instructions. Pt verbalized understanding.

## 2020-01-16 NOTE — Progress Notes (Signed)
Patient ID: Betty Cain, female   DOB: 1975-11-04, 44 y.o.   MRN: [id number] Patient enrolled for [employer] to ship a 30 day cardiac event monitor to her home.

## 2020-01-24 ENCOUNTER — Other Ambulatory Visit: Payer: Self-pay

## 2020-01-24 ENCOUNTER — Ambulatory Visit (INDEPENDENT_AMBULATORY_CARE_PROVIDER_SITE_OTHER): Payer: 59 | Admitting: Family Medicine

## 2020-01-24 ENCOUNTER — Other Ambulatory Visit (INDEPENDENT_AMBULATORY_CARE_PROVIDER_SITE_OTHER): Payer: Self-pay | Admitting: Family Medicine

## 2020-01-24 ENCOUNTER — Encounter (INDEPENDENT_AMBULATORY_CARE_PROVIDER_SITE_OTHER): Payer: Self-pay | Admitting: Family Medicine

## 2020-01-24 VITALS — BP 116/75 | HR 102 | Temp 98.4°F | Ht <= 58 in | Wt 151.0 lb

## 2020-01-24 DIAGNOSIS — E119 Type 2 diabetes mellitus without complications: Secondary | ICD-10-CM

## 2020-01-24 DIAGNOSIS — E669 Obesity, unspecified: Secondary | ICD-10-CM

## 2020-01-24 DIAGNOSIS — E1169 Type 2 diabetes mellitus with other specified complication: Secondary | ICD-10-CM

## 2020-01-24 DIAGNOSIS — D508 Other iron deficiency anemias: Secondary | ICD-10-CM

## 2020-01-24 DIAGNOSIS — Z9189 Other specified personal risk factors, not elsewhere classified: Secondary | ICD-10-CM

## 2020-01-24 DIAGNOSIS — E1159 Type 2 diabetes mellitus with other circulatory complications: Secondary | ICD-10-CM | POA: Diagnosis not present

## 2020-01-24 DIAGNOSIS — I152 Hypertension secondary to endocrine disorders: Secondary | ICD-10-CM

## 2020-01-24 DIAGNOSIS — Z6831 Body mass index (BMI) 31.0-31.9, adult: Secondary | ICD-10-CM

## 2020-01-24 DIAGNOSIS — E559 Vitamin D deficiency, unspecified: Secondary | ICD-10-CM

## 2020-01-24 DIAGNOSIS — E785 Hyperlipidemia, unspecified: Secondary | ICD-10-CM

## 2020-01-24 MED ORDER — VITAMIN D (ERGOCALCIFEROL) 1.25 MG (50000 UNIT) PO CAPS: 50000.0000 [IU] | ORAL_CAPSULE | ORAL | 0 refills | Status: DC

## 2020-01-25 ENCOUNTER — Ambulatory Visit (INDEPENDENT_AMBULATORY_CARE_PROVIDER_SITE_OTHER): Payer: Medicaid Other

## 2020-01-25 ENCOUNTER — Encounter: Payer: Self-pay | Admitting: Interventional Cardiology

## 2020-01-25 DIAGNOSIS — R002 Palpitations: Secondary | ICD-10-CM

## 2020-01-25 MED FILL — VIT D2 1.25 MG (50,000 UNIT: 1.25 MG | 28 days supply | Qty: 4 | Fill #0

## 2020-01-26 ENCOUNTER — Telehealth: Payer: Self-pay | Admitting: Cardiology

## 2020-01-26 NOTE — Telephone Encounter (Signed)
After hours answering  I was called by the [employer] company as they got a triggered response from the patient at 3:45pm today. Symptoms- dizziness, fatigue, not feeling good. Monitor result show NSR, arficats per the [employer] They were not able to reach the patient  She is on a monitor for palpitations, she is getting treatment for sinus tachycardia (chronic) with a beta blocker  I called the patient (limited english) but was able to tell me that she has had weakness and fatigue for a while. Yesterday her blood sugar was low 49- started having extreme fatigue, weakness. Today blood sugar is better 105 but still continues to feel poorly. Denies palpitations, near syncope or syncope. -> I instructed her to keep hydrating, check Blood sugar and report to [place] if she has palpitations or new symptoms. -> we will continue to monitor her rhythm via [employer] monitor   Renae Fickle, MD Cardiology coverage.

## 2020-01-29 NOTE — Progress Notes (Signed)
Chief Complaint:   OBESITY Betty Cain is here to discuss her progress with her obesity treatment plan along with follow-up of her obesity related diagnoses. Betty Cain is on the Category 1 Plan and states she is following her eating plan approximately 50% of the time. Betty Cain states she is exercising for 0 minutes 0 times per week.  Today's visit was #: 2 Starting weight: 152 lbs Starting date: 01/10/2020 Today's weight: 151 lbs Today's date: 01/24/2020 Total lbs lost to date: 1 lb Total lbs lost since last in-office visit: 1 lb  Interim History:   Patient is here today to review NEW Meal PLAN and to discuss all recent labs done here and/ or done at outside facilities.  Extended time was spent counseling Betty Cain on all new disease processes that were discovered or that are worsening.    She did not weigh her foods and only followed the plan 50% of the time.  She says she followed the plan for 5-6 days exactly as is.  Hunger was well controlled those days with no cravings.  She loves chocolate and says she could not resist.  She had an interpreter with her today.  Assessment/Plan:   1. Type 2 diabetes mellitus with other specified complication, without long-term current use of insulin (HCC) Betty Cain is taking metformin 500 mg twice daily and glipizide 10 mg daily.  She denies side effects from medications or issues or concerns today.  Plan:  Continue medications.  Continue prudent nutritional plan and weight loss.  Increase exercise level as tolerated.  Will continue to monitor blood sugars as she loses weight.  Check A1c and fasting insulin every 3 months.  Lab Results  Component Value Date   HGBA1C 7.5 (A) 01/02/2020   HGBA1C 7.5 01/02/2020   HGBA1C 6.8 (A) 08/20/2019   HGBA1C 6.8 08/20/2019   HGBA1C 6.8 (A) 08/20/2019   HGBA1C 6.8 08/20/2019   Lab Results  Component Value Date   LDLCALC 53 01/10/2020   CREATININE 0.66 01/02/2020   Lab Results  Component Value Date   INSULIN 8.7  01/10/2020   2. Hypertension associated with diabetes ([place]) Review: taking medications as instructed, no medication side effects noted, no chest pain on exertion, no dyspnea on exertion, no swelling of ankles.  She is taking Toprol XL 100 mg daily for blood pressure.  No side effects.  Plan:  At goal today.  Continue medication.  Continue prudent nutritional plan, low salt diet, weight loss.  Encourage home blood pressure monitoring.  BP Readings from Last 3 Encounters:  01/24/20 116/75  01/10/20 111/74  01/02/20 119/74   3. Hyperlipidemia associated with type 2 diabetes mellitus ([place]) Betty Cain has hyperlipidemia and has been trying to improve her cholesterol levels with intensive lifestyle modification including a low saturated fat diet, exercise and weight loss. She denies any chest pain, claudication or myalgias.  She is taking Crestor 5 mg daily.  No side effects.  Plan:  Continue medication.  Reduce sat/trans fats in diet.  Continue prudent nutritional plan and weight loss.  Will recheck labs in 3 months or so.  Increase exercise as tolerated.  Lab Results  Component Value Date   ALT 14 05/21/2019   AST 13 01/02/2020   ALKPHOS 97 01/02/2020   BILITOT 0.2 01/02/2020   Lab Results  Component Value Date   CHOL 154 01/10/2020   HDL 88 01/10/2020   LDLCALC 53 01/10/2020   TRIG 65 01/10/2020   CHOLHDL 3.6 08/20/2019   4.  Other iron deficiency anemia Betty Cain is not a vegetarian.  She does not have a history of weight loss surgery.  Betty Cain is taking an OTC iron supplement 325 mg daily.  Plan:  Continue OTC iron supplement.  Will continue to monitor.  CBC Latest Ref Rng & Units 01/10/2020 06/14/2019 08/30/2018  WBC 3.4 - 10.8 x10E3/uL 7.4 9.5 5.6  Hemoglobin 11.1 - 15.9 g/dL 10.9(L) 10.6(L) 12.0  Hematocrit 34.0 - 46.6 % 34.4 34.3 37.9  Platelets 150 - 450 x10E3/uL 442 438 331   Lab Results  Component Value Date   IRON 53 01/20/2018   TIBC 317 01/20/2018   FERRITIN 61 01/20/2018   Lab  Results  Component Value Date   VITAMINB12 344 01/10/2020   5. Vitamin D deficiency Betty Cain has a history of Vitamin D deficiency with resultant generalized fatigue as her primary symptom.  she is taking no vitamin D supplement for this deficiency and tolerating it well without side-effect.   Most recent Vitamin D lab reviewed-  level: 22.9.  Recheck vitamin D level in 3 months.  Plan:   - Discussed importance of vitamin D (as well as calcium) to their health and well-being.   - We reviewed possible symptoms of low Vitamin D including low energy, depressed mood, muscle aches, joint aches, osteoporosis etc.  - We discussed that low Vitamin D levels may be linked to an increased risk of cardiovascular events and even increased risk of cancers- such as colon and breast.   - Educated pt that weight loss will likely improve availability of vitamin D, thus encouraged Betty Cain to continue with meal plan and their weight loss efforts to further improve this condition  - I recommend pt take a weekly prescription vit D- see script below- which pt agrees to after discussion of risks and benefits of this medication.      - Informed patient this may be a lifelong thing, and she was encouraged to continue to take the medicine until pt told otherwise.   We will need to monitor levels regularly ( q 3-4 mo on average )  to keep levels within normal limits.   - All pt's questions and concerns regarding this condition addressed  -Start Vitamin D, Ergocalciferol, (DRISDOL) 1.25 MG (50000 UNIT) CAPS capsule; Take 1 capsule (50,000 Units total) by mouth every 7 (seven) days.  Dispense: 4 capsule; Refill: 0   6. At risk for heart disease Due to Betty Cain's current state of health and medical condition(s), she is at a higher risk for heart disease.   This puts the patient at much greater risk to subsequently develop cardiopulmonary conditions that can significantly affect patient's quality of life in a negative  manner as well.    At least 18 minutes was spent on counseling Betty Cain about these concerns today and I stressed the importance of reversing risks factors of obesity, esp truncal and visceral fat, hypertension, hyperlipidemia, pre-diabetes.   Initial goal is to lose at least 5-10% of starting weight to help reduce these risk factors.   Counseling: Intensive lifestyle modifications discussed with Betty Cain as most appropriate first line treatment.  she will continue to work on diet, exercise and weight loss efforts.  We will continue to reassess these conditions on a fairly regular basis in an attempt to decrease patient's overall morbidity and mortality.  Evidence-based interventions for health behavior change were utilized today including the discussion of self monitoring techniques, problem-solving barriers and SMART goal setting techniques.  Specifically regarding patient's less  desirable eating habits and patterns, we employed the technique of small changes when Betty Cain has not been able to fully commit to her prudent nutritional plan.   7. Class 1 obesity with serious comorbidity and body mass index (BMI) of 31.0 to 31.9 in adult, unspecified obesity type  Betty Cain is currently in the action stage of change. As such, her goal is to continue with weight loss efforts. She has agreed to the Category 1 Plan.   Exercise goals: All adults should avoid inactivity. Some physical activity is better than none, and adults who participate in any amount of physical activity gain some health benefits.  Behavioral modification strategies: increasing lean protein intake, decreasing simple carbohydrates, increasing water intake, decreasing sodium intake, meal planning and cooking strategies, keeping healthy foods in the home, better snacking choices and planning for success.  Betty Cain has agreed to follow-up with our clinic in 2-3 weeks. She was informed of the importance of frequent follow-up visits to maximize her success with  intensive lifestyle modifications for her multiple health conditions.   Objective:   Blood pressure 116/75, pulse (!) 102, temperature 98.4 F (36.9 C), height 4\' 10"  (1.473 m), weight 151 lb (68.5 kg), SpO2 100 %. Body mass index is 31.56 kg/m.  General: Cooperative, alert, well developed, in no acute distress. HEENT: Conjunctivae and lids unremarkable. Cardiovascular: Regular rhythm.  Lungs: Normal work of breathing. Neurologic: No focal deficits.   Lab Results  Component Value Date   CREATININE 0.66 01/02/2020   BUN 10 01/02/2020   NA 139 01/02/2020   K 4.6 01/02/2020   CL 102 01/02/2020   CO2 21 05/21/2019   Lab Results  Component Value Date   ALT 14 05/21/2019   AST 13 01/02/2020   ALKPHOS 97 01/02/2020   BILITOT 0.2 01/02/2020   Lab Results  Component Value Date   HGBA1C 7.5 (A) 01/02/2020   HGBA1C 7.5 01/02/2020   HGBA1C 6.8 (A) 08/20/2019   HGBA1C 6.8 08/20/2019   HGBA1C 6.8 (A) 08/20/2019   HGBA1C 6.8 08/20/2019   Lab Results  Component Value Date   INSULIN 8.7 01/10/2020   Lab Results  Component Value Date   TSH 3.160 01/10/2020   Lab Results  Component Value Date   CHOL 154 01/10/2020   HDL 88 01/10/2020   LDLCALC 53 01/10/2020   TRIG 65 01/10/2020   CHOLHDL 3.6 08/20/2019   Lab Results  Component Value Date   WBC 7.4 01/10/2020   HGB 10.9 (L) 01/10/2020   HCT 34.4 01/10/2020   MCV 79 01/10/2020   PLT 442 01/10/2020   Lab Results  Component Value Date   IRON 53 01/20/2018   TIBC 317 01/20/2018   FERRITIN 61 01/20/2018   Attestation Statements:   Reviewed by clinician on day of visit: allergies, medications, problem list, medical history, surgical history, family history, social history, and previous encounter notes.  I, Water quality scientist, CMA, am acting as Location manager for [employer], DO.  I have reviewed the above documentation for accuracy and completeness, and I agree with the above. -  *Marjory Sneddon, D.O.  The [place] was signed into law in 2016 which includes the topic of electronic health records.  This provides immediate access to information in MyChart.  This includes consultation notes, operative notes, office notes, lab results and pathology reports.  If you have any questions about what you read please let us know at your next visit so we can discuss your concerns and take  corrective action if need be.  We are right here with you.

## 2020-01-30 ENCOUNTER — Telehealth: Payer: Self-pay | Admitting: *Deleted

## 2020-01-30 ENCOUNTER — Encounter (INDEPENDENT_AMBULATORY_CARE_PROVIDER_SITE_OTHER): Payer: Self-pay | Admitting: Family Medicine

## 2020-01-30 NOTE — Telephone Encounter (Signed)
Patient requires additional strips for cardiac event monitor.  [employer] contacted to ship additional strips to [address] [place], [place] [address].

## 2020-01-31 NOTE — Telephone Encounter (Signed)
Dr Opalski, please advise  

## 2020-02-03 ENCOUNTER — Encounter: Payer: Self-pay | Admitting: Adult Health

## 2020-02-04 MED FILL — AJOVY 225 MG/1.5ML SOAJ: 225 | 30 days supply | Qty: 2 | Fill #1

## 2020-02-11 MED FILL — NURTEC 75 MG TBDP: 75 | 30 days supply | Qty: 8 | Fill #1

## 2020-02-11 MED FILL — ONETOUCH DELICA PLUS LANCET: 30 days supply | Qty: 100 | Fill #1

## 2020-02-11 NOTE — Progress Notes (Signed)
CARDIOLOGY OFFICE NOTE  Date:  02/25/2020    Elwyn Reach Date of Birth: 04-26-1975 Medical Record #[id number]  PCP:  Vevelyn Francois, NP  Cardiologist:  Jennings Books   Chief Complaint  Patient presents with  . Follow-up    History of Present Illness: Betty Cain is a 44 y.o. female who presents today for a follow up visit/one year check. Seen for Dr. Tamala Julian. Previously seen by Dr. Gwenlyn Found.  Seen in July of 2018 at request of PCP for palpitations and chest pain. She had had similar complaints in 2015 and work up then showed structurally normal heart by echo and a normal 30 day monitor.CT angio of the chest in 2018 was unremarkable - no coronary calcification.  She has since developed DM and HLD - on therapy. She is on Pamelor which can cause elevated HR.   I saw her back in [facility] 2019- she had lots of somatic complaints. She had seen neurology the day before forchronic migraines, paresthesias of all 4 extremities and face. Noted that there was the possibility of anxiety/depressionas the culprit. She was to have MRI of the brain and had labs. Gabapentin was increased.I thought her cardiac status was stable. She is quite stressed with having 4 kids.  Still with lots of chest pain when seen in May of 2019 - GXT was arranged and this was negative.When seen in December 2019 - still endorsing lots of symptoms. I tried to get a coronary CT - we were unable to get her HR down - ended up doing Lexiscan which was low risk. Last seen in November - still with some fast heart beating. Glucose was up. 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     Comes in today. Here with the interpretor. She thinks that overall, she is better in regards to her chronic chest pain. She has lost weight - down 18 pounds since last visit with me. She is going to the weight loss clinic - she has been successful with this. She still has some palpitations and fast  heart beating. She brought a monitor back yesterday that is in process. She has not had syncope. She remains on Metoprolol 100 mg a day. She is also on Pamelor which can cause some tachycardia - she has been on this for some time.   Past Medical History:  Diagnosis Date  . Allergy   . Anemia   . Anxiety 01/2019  . Asthma   . B12 deficiency   . Back pain   . Common migraine with intractable migraine 06/28/2017  . Constipation   . Diabetes ([place]) 02/2019  . Fatigue   . GERD (gastroesophageal reflux disease)    pos H pylori  . Hypertension    controlled with medication  . IBS (irritable bowel syndrome) 2005  . Joint pain   . Neonatal death    Vaginal delivery, full term-lived x2 hours.   . Palpitations   . Shortness of breath   . Shortness of breath on exertion   . Spinal headache   . Swelling of both lower extremities   . Valvular heart disease   . Vitamin D deficiency 02/2019    Past Surgical History:  Procedure Laterality Date  . ADENOIDECTOMY     and tonsils (as a child)  . boil  2003   right elbow  . CESAREAN SECTION     x4  . CESAREAN SECTION  06/02/2011   Procedure: CESAREAN SECTION;  Surgeon: [name], MD;  Location: [facility];  Service: Gynecology;  Laterality: N/A;  Primary Cesarean Section Delivery Baby Boy @ 0004, Apgars 9/9  . CESAREAN SECTION N/A 12/10/2013   Procedure: REPEAT CESAREAN SECTION;  Surgeon: [name], MD;  Location: [facility];  Service: Obstetrics;  Laterality: N/A;  . CESAREAN SECTION    . colonoscopy  11/23/2018   stated had issues with sleep when in for procedure  . OTHER SURGICAL HISTORY  2005   Uterine surgery   . uterine cauterization       Medications: Current Meds  Medication Sig  . acetaminophen (TYLENOL) 500 MG tablet Take 500 mg by mouth every 6 (six) hours as needed.  Marland Kitchen albuterol (VENTOLIN HFA) 108 (90 Base) MCG/ACT inhaler Inhale 2 puffs into the lungs every 6 (six) hours as needed for wheezing or shortness of breath.  .  blood glucose meter kit and supplies Dispense based on patient and insurance preference. Use up to four times daily as directed. (FOR ICD-10 E10.9, E11.9).  Marland Kitchen Blood Glucose Monitoring Suppl (ONETOUCH VERIO IQ SYSTEM) w/Device KIT 1 application by Does not apply route 4 (four) times daily.  . Blood Pressure Monitoring DEVI 1 each by Does not apply route daily.  Marland Kitchen etonogestrel (NEXPLANON) 68 MG IMPL implant 1 each by Subdermal route once.  . ferrous sulfate (FEROSUL) 325 (65 FE) MG tablet TAKE 1 TABLET (325 MG TOTAL) BY MOUTH DAILY.  . fluticasone (FLONASE) 50 MCG/ACT nasal spray Place 2 sprays into both nostrils daily.  . fluticasone furoate-vilanterol (BREO ELLIPTA) 100-25 MCG/INH AEPB INHALE 1 PUFF INTO THE LUNGS DAILY.  Marland Kitchen Fremanezumab-vfrm (AJOVY) 225 MG/1.5ML SOAJ Inject 225 mg into the skin every 30 (thirty) days.  Marland Kitchen gabapentin (NEURONTIN) 300 MG capsule 1 cap am and 2 QHs  . glipiZIDE (GLUCOTROL) 10 MG tablet Take 1 tablet (10 mg total) by mouth 2 (two) times daily before a meal.  . glucose blood test strip Use as instructed  . hydrocortisone (ANUSOL-HC) 2.5 % rectal cream Place 1 application rectally 2 (two) times daily.  Marland Kitchen levocetirizine (XYZAL) 5 MG tablet Take 1 tablet (5 mg total) by mouth every evening.  . meloxicam (MOBIC) 15 MG tablet Take 1 tablet (15 mg total) by mouth daily.  . metFORMIN (GLUCOPHAGE) 500 MG tablet Take 1 tablet (500 mg total) by mouth 2 (two) times daily with a meal.  . methylcellulose (CITRUCEL) oral powder Take as directed, daily  . metoprolol succinate (TOPROL XL) 100 MG 24 hr tablet Take 1 tablet (100 mg total) by mouth daily. Take with or immediately following a meal.  . Multiple Vitamin (MULTI VITAMIN DAILY PO) Take by mouth.  . nortriptyline (PAMELOR) 25 MG capsule Take 1 capsule (25 mg total) by mouth at bedtime.  . ondansetron (ZOFRAN-ODT) 8 MG disintegrating tablet TAKE 1 TABLET BY MOUTH EVERY 8 HOURS AS NEEDED FOR NAUSEA OR VOMITING.  Glory Rosebush Delica  Lancets 62X MISC 1 application by Does not apply route in the morning, at noon, in the evening, and at bedtime.  . pantoprazole (PROTONIX) 40 MG tablet TAKE 1 TABLET BY MOUTH 2 TIMES DAILY BEFORE A MEAL.  Marland Kitchen potassium chloride 20 MEQ/15ML (10%) SOLN Take 15 mLs (20 mEq total) by mouth daily.  . Rimegepant Sulfate (NURTEC) 75 MG TBDP Take 1 tablet at the onset of migraine.  . rizatriptan (MAXALT) 10 MG tablet Take 1 tablet (10 mg total) by mouth 3 (three) times daily  as needed for migraine. May repeat in 2 hours if needed  . rosuvastatin (CRESTOR) 5 MG tablet Take 1 tablet (5 mg total) by mouth at bedtime.  . Vitamin D, Ergocalciferol, (DRISDOL) 1.25 MG (50000 UNIT) CAPS capsule Take 1 capsule (50,000 Units total) by mouth every 7 (seven) days.     Allergies: Allergies  Allergen Reactions  . Topamax [Topiramate] Itching and Rash    Social History: The patient  reports that she has never smoked. She has never used smokeless tobacco. She reports that she does not drink alcohol and does not use drugs.   Family History: The patient's family history includes Diabetes in her father, mother, and sister.   Review of Systems: Please see the history of present illness.   All other systems are reviewed and negative.   Physical Exam: VS:  BP 130/86   Pulse (!) 122   Ht 4' 10"  (1.473 m)   Wt 149 lb 3.2 oz (67.7 kg)   SpO2 97%   BMI 31.18 kg/m  .  BMI Body mass index is 31.18 kg/m.  Wt Readings from Last 3 Encounters:  02/25/20 149 lb 3.2 oz (67.7 kg)  02/13/20 149 lb (67.6 kg)  01/24/20 151 lb (68.5 kg)    General: Alert and in no acute distress.  Her weight is down from 167 from a year ago.  Cardiac: Regular rate and rhythm. HR about 108 by my count. No edema.  Respiratory:  Lungs are clear to auscultation bilaterally with normal work of breathing.  GI: Soft and nontender.  MS: No deformity or atrophy. Gait and ROM intact.  Skin: Warm and dry. Color is normal.  Neuro:  Strength and  sensation are intact and no gross focal deficits noted.  Psych: Alert, appropriate and with normal affect.   LABORATORY DATA:  EKG:  EKG is ordered today. Personally reviewed by me. This shows sinus tach - she has diffuse T wave inversion in V2 thru 6 - these have been noted on prior tracings.    Lab Results  Component Value Date   WBC 7.4 01/10/2020   HGB 10.9 (L) 01/10/2020   HCT 34.4 01/10/2020   PLT 442 01/10/2020   GLUCOSE 196 (H) 01/02/2020   CHOL 154 01/10/2020   TRIG 65 01/10/2020   HDL 88 01/10/2020   LDLCALC 53 01/10/2020   ALT 14 05/21/2019   AST 13 01/02/2020   NA 139 01/02/2020   K 4.6 01/02/2020   CL 102 01/02/2020   CREATININE 0.66 01/02/2020   BUN 10 01/02/2020   CO2 21 05/21/2019   TSH 3.160 01/10/2020   HGBA1C 7.5 (A) 01/02/2020   HGBA1C 7.5 01/02/2020     BNP (last 3 results) No results for input(s): BNP in the last 8760 hours.  ProBNP (last 3 results) No results for input(s): PROBNP in the last 8760 hours.   Other Studies Reviewed Today:  Myoview Study Highlights 04/2018   Nuclear stress EF: 66%.  There was no ST segment deviation noted during stress.  The study is normal.  This is a low risk study.  The left ventricular ejection fraction is hyperdynamic (>65%).     GXT 09/2017 Study Highlights    Blood pressure demonstrated a normal response to exercise.  There was no ST segment deviation noted during stress.  Clinically and electrically negative for ischemia  Negative GXT     CT Angio 08/10/2016: IMPRESSION: Negative for acute pulmonary embolus or aortic dissection. Clear lung fields.  ECHOCARDIOGRAPHY 2015:  Study Conclusions  - Left ventricle: The cavity size was normal. Wall thickness was normal. Systolic function was normal. The estimated ejection fraction was in the range of 60% to 65%.  [place] June 2015:  NSR and ST    Assessment & Plan:  1. Sinus tachycardia - on beta  blocker - she is also on Pamelor which can cause elevation in HR. She has a monitor in process - these results are pending - we will be in touch with her about this.   2. DM - per PCP -  She is actively losing weight - hopefully this will improve as she continues to lose weight.   3. HLD - on statin therapy. Labs by PCP.   4. Migraines - followed by PCP. Not endorsed today.   5. Obesity - she is actively losing weight.   6. Prior negative cardiac work up. She is picking up more risk factors however, we will need to follow - she looks much better today overall to me.    Current medicines are reviewed with the patient today.  The patient does not have concerns regarding medicines other than what has been noted above.  The following changes have been made:  See above.  Labs/ tests ordered today include:   No orders of the defined types were placed in this encounter.    Disposition:   FU with Dr. Tamala Julian in a year. We will be in touch about her monitor results. Would continue her Toprol, advised her to speak with Neurology about her Pamelor. Continue efforts at diet/weight loss.       Patient is agreeable to this plan and will call if any problems develop in the interim.   SignedTruitt Merle, NP  02/25/2020 3:29 PM  [facility] [address] [place], [place]  [address] Phone: [phone] Fax: [phone]

## 2020-02-13 ENCOUNTER — Encounter (INDEPENDENT_AMBULATORY_CARE_PROVIDER_SITE_OTHER): Payer: Self-pay | Admitting: Family Medicine

## 2020-02-13 ENCOUNTER — Other Ambulatory Visit: Payer: Self-pay

## 2020-02-13 ENCOUNTER — Ambulatory Visit (INDEPENDENT_AMBULATORY_CARE_PROVIDER_SITE_OTHER): Payer: 59 | Admitting: Family Medicine

## 2020-02-13 ENCOUNTER — Other Ambulatory Visit (INDEPENDENT_AMBULATORY_CARE_PROVIDER_SITE_OTHER): Payer: Self-pay | Admitting: Family Medicine

## 2020-02-13 VITALS — BP 108/73 | HR 106 | Temp 98.5°F | Ht <= 58 in | Wt 149.0 lb

## 2020-02-13 DIAGNOSIS — E559 Vitamin D deficiency, unspecified: Secondary | ICD-10-CM | POA: Diagnosis not present

## 2020-02-13 DIAGNOSIS — E1169 Type 2 diabetes mellitus with other specified complication: Secondary | ICD-10-CM | POA: Diagnosis not present

## 2020-02-13 DIAGNOSIS — E669 Obesity, unspecified: Secondary | ICD-10-CM

## 2020-02-13 MED ORDER — VITAMIN D (ERGOCALCIFEROL) 1.25 MG (50000 UNIT) PO CAPS: 50000.0000 [IU] | ORAL_CAPSULE | ORAL | 0 refills | Status: DC

## 2020-02-14 DIAGNOSIS — E119 Type 2 diabetes mellitus without complications: Secondary | ICD-10-CM | POA: Insufficient documentation

## 2020-02-14 NOTE — Telephone Encounter (Signed)
Dr Opalski, please advise  

## 2020-02-14 NOTE — Progress Notes (Addendum)
Chief Complaint:   OBESITY Betty Cain is here to discuss her progress with her obesity treatment plan along with follow-up of her obesity related diagnoses. Betty Cain is on the Category 1 Plan and states she is following her eating plan approximately 40% of the time. Betty Cain states she is exercising for 0 minutes 0 times per week.  Today's visit was #: 3 Starting weight: 152 lbs Starting date: 01/10/2020 Today's weight: 149 lbs Today's date: 02/13/2020 Total lbs lost to date: 3 lbs Total lbs lost since last in-office visit: 2 lbs Total weight loss percentage to date: -1.97%   Interim History: Betty Cain says that she has not been eating food that is on the plan over the last 2 weeks.  She is not eating on a regular bases as recommended.  She tells me there are no particular reasons or barriers.  At last offive visit, she followed the meal plan 5 days or so and did not experience hunger or cravings- lost 1 lbs at that [place]..  This time, when asked if there was a day she followed it from morning to night, she said 'no'.  She reports she has been skipping meals, eating chocolate, and sweets.  She is unable to give me her food intake recall today.  Plan:  As this is her third office visit with [place], I expressed my sincere concerns about her readiness to follow the meal plan and make changes to get healthier as she desires.  I explained that her questions about what and when to eat are concerning since this has been a very large focus of all our office visits together.  She tells me she would like to continue seeing [place] and try the plan.  Assessment/Plan:   1. Type 2 diabetes mellitus with other specified complication, without long-term current use of insulin (HCC) Betty Cain tells me that she does not know what or when to eat, even though this is our third office visit together talking about the meal plan.  FBS 150s (95-155).  Denies symptoms or concerns.  She is on glucotrol and metformin.  Plan:  Again reiterated to her  to follow the plan and eat on a regular schedule.  Recommend she as her PCP about diabetes education referral.  Continue medications as managed by PCP and make follow-up appointment every 3 months or sooner if concerns arise.   Again explained care for hypoglycemic episodes, however, she is still skipping meals and lacks compliance with the meal plan.  Lab Results  Component Value Date   HGBA1C 7.5 (A) 01/02/2020   HGBA1C 7.5 01/02/2020   HGBA1C 6.8 (A) 08/20/2019   HGBA1C 6.8 08/20/2019   HGBA1C 6.8 (A) 08/20/2019   HGBA1C 6.8 08/20/2019   Lab Results  Component Value Date   LDLCALC 53 01/10/2020   CREATININE 0.66 01/02/2020   Lab Results  Component Value Date   INSULIN 8.7 01/10/2020   2. Vitamin D deficiency Betty Cain's Vitamin D level was 22.9 on 01/10/2020. She is currently taking prescription vitamin D 50,000 IU each week. She denies nausea, vomiting or muscle weakness.  Tolerating well without side effects.  Plan:  Continue vitamin D 50,000 IU weekly.  Will refill today, as per below.  -Refill Vitamin D, Ergocalciferol, (DRISDOL) 1.25 MG (50000 UNIT) CAPS capsule; Take 1 capsule (50,000 Units total) by mouth every 7 (seven) days.  Dispense: 4 capsule; Refill: 0  3. Class 1 obesity with serious comorbidity and body mass index (BMI) of 31.0 to 31.9 in  adult, unspecified obesity type  Betty Cain is currently in the action stage of change. As such, her goal is to continue with weight loss efforts. She has agreed to the Category 1 Plan.   Exercise goals: As is.  Behavioral modification strategies: no skipping meals, planning for success and decreasing junk food.  Betty Cain has agreed to follow-up with our clinic in 2-3 weeks. She was informed of the importance of frequent follow-up visits to maximize her success with intensive lifestyle modifications for her multiple health conditions.   Objective:   Blood pressure 108/73, pulse (!) 106, temperature 98.5 F (36.9 C), height 4\' 10"  (1.473  m), weight 149 lb (67.6 kg), SpO2 99 %. Body mass index is 31.14 kg/m.  General: Cooperative, alert, well developed, in no acute distress. HEENT: Conjunctivae and lids unremarkable. Cardiovascular: Regular rhythm.  Lungs: Normal work of breathing. Neurologic: No focal deficits.   Lab Results  Component Value Date   CREATININE 0.66 01/02/2020   BUN 10 01/02/2020   NA 139 01/02/2020   K 4.6 01/02/2020   CL 102 01/02/2020   CO2 21 05/21/2019   Lab Results  Component Value Date   ALT 14 05/21/2019   AST 13 01/02/2020   ALKPHOS 97 01/02/2020   BILITOT 0.2 01/02/2020   Lab Results  Component Value Date   HGBA1C 7.5 (A) 01/02/2020   HGBA1C 7.5 01/02/2020   HGBA1C 6.8 (A) 08/20/2019   HGBA1C 6.8 08/20/2019   HGBA1C 6.8 (A) 08/20/2019   HGBA1C 6.8 08/20/2019   Lab Results  Component Value Date   INSULIN 8.7 01/10/2020   Lab Results  Component Value Date   TSH 3.160 01/10/2020   Lab Results  Component Value Date   CHOL 154 01/10/2020   HDL 88 01/10/2020   LDLCALC 53 01/10/2020   TRIG 65 01/10/2020   CHOLHDL 3.6 08/20/2019   Lab Results  Component Value Date   WBC 7.4 01/10/2020   HGB 10.9 (L) 01/10/2020   HCT 34.4 01/10/2020   MCV 79 01/10/2020   PLT 442 01/10/2020   Lab Results  Component Value Date   IRON 53 01/20/2018   TIBC 317 01/20/2018   FERRITIN 61 01/20/2018   Attestation Statements:   Reviewed by clinician on day of visit: allergies, medications, problem list, medical history, surgical history, family history, social history, and previous encounter notes.  Time spent on visit including pre-visit chart review and post-visit care and charting was 50+ minutes, especially with use of intervention.   I, Water quality scientist, CMA, am acting as Location manager for [employer], DO.  I have reviewed the above documentation for accuracy and completeness, and I agree with the above. Betty Cain, D.O.  The [place] was signed into law  in 2016 which includes the topic of electronic health records.  This provides immediate access to information in MyChart.  This includes consultation notes, operative notes, office notes, lab results and pathology reports.  If you have any questions about what you read please let us know at your next visit so we can discuss your concerns and take corrective action if need be.  We are right here with you.

## 2020-02-15 ENCOUNTER — Other Ambulatory Visit: Payer: Self-pay | Admitting: Nurse Practitioner

## 2020-02-15 DIAGNOSIS — E119 Type 2 diabetes mellitus without complications: Secondary | ICD-10-CM

## 2020-02-15 DIAGNOSIS — E1165 Type 2 diabetes mellitus with hyperglycemia: Secondary | ICD-10-CM

## 2020-02-18 ENCOUNTER — Encounter (INDEPENDENT_AMBULATORY_CARE_PROVIDER_SITE_OTHER): Payer: Self-pay

## 2020-02-19 ENCOUNTER — Ambulatory Visit: Payer: Medicaid Other | Admitting: Nurse Practitioner

## 2020-02-21 ENCOUNTER — Other Ambulatory Visit: Payer: Self-pay | Admitting: Nurse Practitioner

## 2020-02-21 DIAGNOSIS — E1165 Type 2 diabetes mellitus with hyperglycemia: Secondary | ICD-10-CM

## 2020-02-21 MED ORDER — GLIPIZIDE 10 MG PO TABS: 10.0000 mg | ORAL_TABLET | Freq: Two times a day (BID) | ORAL | 3 refills | Status: DC

## 2020-02-21 MED ORDER — PANTOPRAZOLE SODIUM 40 MG PO TBEC: DELAYED_RELEASE_TABLET | ORAL | 1 refills | Status: DC

## 2020-02-21 MED ORDER — MELOXICAM 15 MG PO TABS: 15.0000 mg | ORAL_TABLET | Freq: Every day | ORAL | 11 refills | Status: DC

## 2020-02-21 MED ORDER — LEVOCETIRIZINE DIHYDROCHLORIDE 5 MG PO TABS: 5.0000 mg | ORAL_TABLET | Freq: Every evening | ORAL | 3 refills | Status: DC

## 2020-02-21 MED FILL — glipiZIDE 10 MG TABS: 10 | 90 days supply | Qty: 180 | Fill #0

## 2020-02-21 MED FILL — MELOXICAM 15 MG TABLET: 15 | 30 days supply | Qty: 30 | Fill #0

## 2020-02-22 ENCOUNTER — Other Ambulatory Visit: Payer: Self-pay | Admitting: Nurse Practitioner

## 2020-02-22 MED ORDER — GABAPENTIN 300 MG PO CAPS: ORAL_CAPSULE | ORAL | 3 refills | Status: DC

## 2020-02-22 MED ORDER — BREO ELLIPTA 100-25 MCG/INH IN AEPB: INHALATION_SPRAY | RESPIRATORY_TRACT | 3 refills | Status: DC

## 2020-02-22 MED ORDER — ALBUTEROL SULFATE HFA 108 (90 BASE) MCG/ACT IN AERS: 2.0000 | INHALATION_SPRAY | Freq: Four times a day (QID) | RESPIRATORY_TRACT | 2 refills | Status: DC | PRN

## 2020-02-22 MED ORDER — FERROUS SULFATE 325 (65 FE) MG PO TABS: ORAL_TABLET | ORAL | 3 refills | Status: DC

## 2020-02-22 MED ORDER — FLUTICASONE PROPIONATE 50 MCG/ACT NA SUSP: 2.0000 | Freq: Every day | NASAL | 11 refills | Status: DC

## 2020-02-22 MED ORDER — NORTRIPTYLINE HCL 25 MG PO CAPS: 25.0000 mg | ORAL_CAPSULE | Freq: Every day | ORAL | 3 refills | Status: DC

## 2020-02-22 MED FILL — FLUTICASONE PROP 50 MCG SPR: 50 | 30 days supply | Qty: 16 | Fill #0

## 2020-02-22 MED FILL — $VENTOLIN HFA 18G INHALER: 108 (90 BAS | 75 days supply | Qty: 54 | Fill #0

## 2020-02-22 MED FILL — $BREO ELLIPTA 100-25 MCG IH: 100-25 MCG | 90 days supply | Qty: 180 | Fill #0

## 2020-02-25 ENCOUNTER — Encounter: Payer: Self-pay | Admitting: Nurse Practitioner

## 2020-02-25 ENCOUNTER — Ambulatory Visit (INDEPENDENT_AMBULATORY_CARE_PROVIDER_SITE_OTHER): Payer: Medicaid Other | Admitting: Nurse Practitioner

## 2020-02-25 ENCOUNTER — Other Ambulatory Visit: Payer: Self-pay | Admitting: Nurse Practitioner

## 2020-02-25 ENCOUNTER — Other Ambulatory Visit: Payer: Self-pay

## 2020-02-25 VITALS — BP 130/86 | HR 122 | Ht <= 58 in | Wt 149.2 lb

## 2020-02-25 DIAGNOSIS — R000 Tachycardia, unspecified: Secondary | ICD-10-CM | POA: Diagnosis not present

## 2020-02-25 DIAGNOSIS — R002 Palpitations: Secondary | ICD-10-CM

## 2020-02-25 DIAGNOSIS — R079 Chest pain, unspecified: Secondary | ICD-10-CM

## 2020-02-25 MED FILL — ALBUTEROL SULFATE HFA 108 (: 108 (90 BAS | 25 days supply | Qty: 18 | Fill #1

## 2020-02-25 MED FILL — VIT D2 1.25 MG (50,000 UNIT: 1.25 MG | 28 days supply | Qty: 4 | Fill #0

## 2020-02-25 MED FILL — DICYCLOMINE 10 MG CAPSULE: 10 | 30 days supply | Qty: 90 | Fill #1

## 2020-02-25 NOTE — Patient Instructions (Addendum)
After Visit Summary:  We will be checking the following labs today - NONE   We will be in touch with you about your monitor results   Medication Instructions:    Continue with your current medicines.    If you need a refill on your cardiac medications before your next appointment, please call your pharmacy.     Testing/Procedures To Be Arranged:  N/A  Follow-Up:   See Dr. Tamala Julian in one year.  You will receive a reminder letter in the mail two months in advance. If you don't receive a letter, please call our office to schedule the follow-up appointment.  Talk with the neurologist about changing your Nortriptyline - this can sometimes make your heart beat fast    At [facility], you and your health needs are our priority.  As part of our continuing mission to provide you with exceptional heart care, we have created designated Provider Care Teams.  These Care Teams include your primary Cardiologist (physician) and Advanced Practice Providers (APPs -  Physician Assistants and Nurse Practitioners) who all work together to provide you with the care you need, when you need it.  Special Instructions:  . Stay safe, wash your hands for at least 20 seconds and wear a mask when needed.  . It was good to talk with you today.  Marland Kitchen Keep up the good work you've done over the past year!   Call the [place] office at [phone] if you have any questions, problems or concerns.

## 2020-02-26 ENCOUNTER — Telehealth: Payer: Self-pay | Admitting: Adult Health

## 2020-02-26 NOTE — Telephone Encounter (Signed)
Received another PA request for Ajovy. PA was started on [email or web address]. Key is [id number]. No timeline was given for a decision. Will check back later this afternoon for a response.

## 2020-02-27 ENCOUNTER — Ambulatory Visit (INDEPENDENT_AMBULATORY_CARE_PROVIDER_SITE_OTHER): Payer: 59 | Admitting: Physician Assistant

## 2020-02-27 ENCOUNTER — Other Ambulatory Visit (INDEPENDENT_AMBULATORY_CARE_PROVIDER_SITE_OTHER): Payer: Self-pay | Admitting: Physician Assistant

## 2020-02-27 ENCOUNTER — Other Ambulatory Visit: Payer: Self-pay

## 2020-02-27 ENCOUNTER — Encounter (INDEPENDENT_AMBULATORY_CARE_PROVIDER_SITE_OTHER): Payer: Self-pay | Admitting: Physician Assistant

## 2020-02-27 VITALS — BP 116/74 | HR 118 | Temp 98.9°F | Ht <= 58 in | Wt 146.0 lb

## 2020-02-27 DIAGNOSIS — E119 Type 2 diabetes mellitus without complications: Secondary | ICD-10-CM

## 2020-02-27 DIAGNOSIS — E669 Obesity, unspecified: Secondary | ICD-10-CM | POA: Diagnosis not present

## 2020-02-27 DIAGNOSIS — Z9189 Other specified personal risk factors, not elsewhere classified: Secondary | ICD-10-CM | POA: Diagnosis not present

## 2020-02-27 DIAGNOSIS — E559 Vitamin D deficiency, unspecified: Secondary | ICD-10-CM | POA: Diagnosis not present

## 2020-02-27 DIAGNOSIS — I10 Essential (primary) hypertension: Secondary | ICD-10-CM

## 2020-02-27 DIAGNOSIS — I152 Hypertension secondary to endocrine disorders: Secondary | ICD-10-CM

## 2020-02-27 DIAGNOSIS — Z6830 Body mass index (BMI) 30.0-30.9, adult: Secondary | ICD-10-CM

## 2020-02-27 MED ORDER — VITAMIN D (ERGOCALCIFEROL) 1.25 MG (50000 UNIT) PO CAPS: 50000.0000 [IU] | ORAL_CAPSULE | ORAL | 0 refills | Status: DC

## 2020-03-03 ENCOUNTER — Ambulatory Visit (INDEPENDENT_AMBULATORY_CARE_PROVIDER_SITE_OTHER): Payer: 59 | Admitting: Physician Assistant

## 2020-03-03 NOTE — Telephone Encounter (Signed)
Denial posted in covermymeds for PA case [id number].  Denied on November 19. PA Case: [id number], Status: Denied.  Notification: Completed. Drug: Ajovy 225MG /1.5ML auto-injectors Form: [employer] PA Form

## 2020-03-03 NOTE — Progress Notes (Signed)
Chief Complaint:   OBESITY Betty Cain is here to discuss her progress with her obesity treatment plan along with follow-up of her obesity related diagnoses. Betty Cain is on the Category 1 Plan and states she is following her eating plan approximately 100% of the time. Betty Cain states she is dancing 15 minutes 4 times per week.  Today's visit was #: 4 Starting weight: 152 lbs Starting date: 01/10/2020 Today's weight: 146 lbs Today's date: 02/27/2020 Total lbs lost to date: 6 Total lbs lost since last in-office visit: 3  Interim History: Betty Cain did well with weight loss. She reports that 2 times a week she skips breakfast due to lack of hunger. Overall she is following the plan better and would like recommendations on vegetables.  Subjective:   Diabetes mellitus with coincident hypertension ([place]). Betty Cain is on Glipizide and metformin. She is managed by her PCP. Fasting blood sugars 109-196. No hypoglycemia.  Lab Results  Component Value Date   HGBA1C 7.5 (A) 01/02/2020   HGBA1C 7.5 01/02/2020   HGBA1C 6.8 (A) 08/20/2019   HGBA1C 6.8 08/20/2019   HGBA1C 6.8 (A) 08/20/2019   HGBA1C 6.8 08/20/2019   Lab Results  Component Value Date   LDLCALC 53 01/10/2020   CREATININE 0.66 01/02/2020   Lab Results  Component Value Date   INSULIN 8.7 01/10/2020   Vitamin D deficiency. Betty Cain is on prescription Vitamin D weekly. Last level was not at goal.   Ref. Range 01/10/2020 12:14  Vitamin D, 25-Hydroxy Latest Ref Range: 30.0 - 100.0 ng/mL 22.9 (L)   At risk for osteoporosis. Betty Cain is at higher risk of osteopenia and osteoporosis due to Vitamin D deficiency.   Assessment/Plan:   Diabetes mellitus with coincident hypertension ([place]). Good blood sugar control is important to decrease the likelihood of diabetic complications such as nephropathy, neuropathy, limb loss, blindness, coronary artery disease, and death. Intensive lifestyle modification including diet, exercise and weight loss are the first  line of treatment for diabetes. Betty Cain will follow-up with her PCP as scheduled and continue to monitor blood sugars daily.  Vitamin D deficiency. Low Vitamin D level contributes to fatigue and are associated with obesity, breast, and colon cancer. She was given a refill on her Vitamin D, Ergocalciferol, (DRISDOL) 1.25 MG (50000 UNIT) CAPS capsule every week #4 with 0 refills and will follow-up for routine testing of Vitamin D, at least 2-3 times per year to avoid over-replacement.   At risk for osteoporosis. Betty Cain was given approximately 15 minutes of osteoporosis prevention counseling today. Betty Cain is at risk for osteopenia and osteoporosis due to her Vitamin D deficiency. She was encouraged to take her Vitamin D and follow her higher calcium diet and increase strengthening exercise to help strengthen her bones and decrease her risk of osteopenia and osteoporosis.  Repetitive spaced learning was employed today to elicit superior memory formation and behavioral change.  Class 1 obesity with serious comorbidity and body mass index (BMI) of 30.0 to 30.9 in adult, unspecified obesity type.  Betty Cain is currently in the action stage of change. As such, her goal is to continue with weight loss efforts. She has agreed to the Category 1 Plan.   Exercise goals: For substantial health benefits, adults should do at least 150 minutes (2 hours and 30 minutes) a week of moderate-intensity, or 75 minutes (1 hour and 15 minutes) a week of vigorous-intensity aerobic physical activity, or an equivalent combination of moderate- and vigorous-intensity aerobic activity. Aerobic activity should be performed in episodes  of at least 10 minutes, and preferably, it should be spread throughout the week.  Behavioral modification strategies: no skipping meals and meal planning and cooking strategies.  Betty Cain has agreed to follow-up with our clinic in 2 weeks. She was informed of the importance of frequent follow-up visits to maximize her  success with intensive lifestyle modifications for her multiple health conditions.   Objective:   Blood pressure 116/74, pulse (!) 118, temperature 98.9 F (37.2 C), temperature source Oral, height 4\' 10"  (1.473 m), weight 146 lb (66.2 kg), SpO2 99 %. Body mass index is 30.51 kg/m.  General: Cooperative, alert, well developed, in no acute distress. HEENT: Conjunctivae and lids unremarkable. Cardiovascular: Regular rhythm.  Lungs: Normal work of breathing. Neurologic: No focal deficits.   Lab Results  Component Value Date   CREATININE 0.66 01/02/2020   BUN 10 01/02/2020   NA 139 01/02/2020   K 4.6 01/02/2020   CL 102 01/02/2020   CO2 21 05/21/2019   Lab Results  Component Value Date   ALT 14 05/21/2019   AST 13 01/02/2020   ALKPHOS 97 01/02/2020   BILITOT 0.2 01/02/2020   Lab Results  Component Value Date   HGBA1C 7.5 (A) 01/02/2020   HGBA1C 7.5 01/02/2020   HGBA1C 6.8 (A) 08/20/2019   HGBA1C 6.8 08/20/2019   HGBA1C 6.8 (A) 08/20/2019   HGBA1C 6.8 08/20/2019   Lab Results  Component Value Date   INSULIN 8.7 01/10/2020   Lab Results  Component Value Date   TSH 3.160 01/10/2020   Lab Results  Component Value Date   CHOL 154 01/10/2020   HDL 88 01/10/2020   LDLCALC 53 01/10/2020   TRIG 65 01/10/2020   CHOLHDL 3.6 08/20/2019   Lab Results  Component Value Date   WBC 7.4 01/10/2020   HGB 10.9 (L) 01/10/2020   HCT 34.4 01/10/2020   MCV 79 01/10/2020   PLT 442 01/10/2020   Lab Results  Component Value Date   IRON 53 01/20/2018   TIBC 317 01/20/2018   FERRITIN 61 01/20/2018   Attestation Statements:   Reviewed by clinician on day of visit: allergies, medications, problem list, medical history, surgical history, family history, social history, and previous encounter notes.  IMichaelene Song, am acting as transcriptionist for Abby Potash, PA-C   I have reviewed the above documentation for accuracy and completeness, and I agree with the above. Abby Potash, PA-C

## 2020-03-12 ENCOUNTER — Encounter: Payer: Self-pay | Admitting: Dietician

## 2020-03-12 ENCOUNTER — Encounter: Payer: Medicaid Other | Attending: Nurse Practitioner | Admitting: Dietician

## 2020-03-12 ENCOUNTER — Telehealth: Payer: Self-pay

## 2020-03-12 ENCOUNTER — Other Ambulatory Visit: Payer: Self-pay

## 2020-03-12 DIAGNOSIS — E1169 Type 2 diabetes mellitus with other specified complication: Secondary | ICD-10-CM | POA: Diagnosis not present

## 2020-03-12 NOTE — Telephone Encounter (Signed)
Erroneous encounter

## 2020-03-12 NOTE — Patient Instructions (Signed)
   For meals, aim to include: 1/2 vegetables, 1/4 carbohydrate, and 1/4 protein. Use the Meal Ideas sheet for good food ideas.   Remember to limit sugary foods/ drinks (such as soda, juice, and dessert) as much as possible.   Balance your snacks with carbohydrates and protein.

## 2020-03-12 NOTE — Progress Notes (Signed)
Diabetes Self-Management Education  Visit Type: First/Initial  Appt. Start Time: 10:00am   Appt. End Time: 11:00am  03/12/2020  Betty Cain, identified by name and date of birth, is a 44 y.o. female with a diagnosis of Diabetes: Type 2.   ASSESSMENT  Patient and interpreter were present for today's visit. Patient came with questions about what to do if her blood sugar goes low or high. Patient states she is currently following the Healthy Weight and Wellness "Category 1" meal plan, which consists of an allowance of 3 meals per day plus low calorie snacks and sugar-free fluids. Patient states this sometimes is not enough food for her. States that, if she is not following this meal plan, she may have meat with vegetables and rice or mac and cheese, pizza, a salad, ice cream, or chocolate. Reports that she gets exercise 2-3 days per week. States that she is sometimes stressed due to busy lifestyle including caring for her children. States she has not received diabetes education before.    Diabetes Self-Management Education - 03/12/20 1020      Visit Information   Visit Type First/Initial      Initial Visit   Diabetes Type Type 2    Are you currently following a meal plan? Yes    What type of meal plan do you follow? [facility] "Category 1"    Are you taking your medications as prescribed? Yes    Date Diagnosed August 2019   per pt     Health Coping   How would you rate your overall health? Poor      Psychosocial Assessment   Self-care barriers English as a second language    Self-management support Doctor's office    Other persons present Interpreter    Patient Concerns Nutrition/Meal planning;Monitoring;Glycemic Control    Special Needs None    Preferred Learning Style No preference indicated    Learning Readiness Ready    How often do you need to have someone help you when you read instructions, pamphlets, or other written materials from your doctor or  pharmacy? 3 - Sometimes    What is the last grade level you completed in school? high school      Complications   Last HgB A1C per patient/outside source 7.5 %   per Epic Sept 2021   How often do you check your blood sugar? 1-2 times/day    Fasting Blood glucose range (mg/dL) 130-179    Postprandial Blood glucose range (mg/dL) 130-179;180-200    Have you had a dilated eye exam in the past 12 months? Yes    Have you had a dental exam in the past 12 months? No    Are you checking your feet? No      Dietary Intake   Breakfast 2 eggs + 1 slice bread + 1 slice cheese    Snack (morning) yogurt    Lunch sandwich ([place] + cheese + vegetables + low calorie bread) + apple    Snack (afternoon) 100 calorie snack    Dinner 6 oz lean meat + 1 cup vegetables    Beverage(s) diet Pepsi, water, coffee w/ creamer, hot tea w/ milk      Exercise   Exercise Type ADL's    How many days per week to you exercise? 3    How many minutes per day do you exercise? 30    Total minutes per week of exercise 90      Patient Education  Previous Diabetes Education No    Disease state  Definition of diabetes, type 1 and 2, and the diagnosis of diabetes;Explored patient's options for treatment of their diabetes;Factors that contribute to the development of diabetes    Nutrition management  Role of diet in the treatment of diabetes and the relationship between the three main macronutrients and blood glucose level;Food label reading, portion sizes and measuring food.;Meal options for control of blood glucose level and chronic complications.    Physical activity and exercise  Role of exercise on diabetes management, blood pressure control and cardiac health.    Monitoring Taught/evaluated SMBG meter.;Purpose and frequency of SMBG.;Identified appropriate SMBG and/or A1C goals.    Acute complications Discussed and identified patients' treatment of hyperglycemia.;Taught treatment of hypoglycemia - the 15 rule.     Psychosocial adjustment Role of stress on diabetes;Identified and addressed patients feelings and concerns about diabetes      Individualized Goals (developed by patient)   Nutrition General guidelines for healthy choices and portions discussed    Medications take my medication as prescribed    Monitoring  test my blood glucose as discussed      Outcomes   Expected Outcomes Demonstrated interest in learning. Expect positive outcomes    Future DMSE PRN           Individualized Plan for Diabetes Self-Management Training:  Learning Objective:  Patient will have a greater understanding of diabetes self-management. Patient education plan is to attend individual and/or group sessions per assessed needs and concerns.   Plan:  Patient Instructions   For meals, aim to include: 1/2 vegetables, 1/4 carbohydrate, and 1/4 protein. Use the Meal Ideas sheet for good food ideas.   Remember to limit sugary foods/ drinks (such as soda, juice, and dessert) as much as possible.   Balance your snacks with carbohydrates and protein.    Expected Outcomes:  Demonstrated interest in learning. Expect positive outcomes  Education material provided: ADA - How to Thrive: A Guide for Your Journey with Diabetes; MyPlate; Meal Ideas; Understanding Blood Glucose (in Arabic)   If problems or questions, patient to contact team via:  Phone and Email  Future DSME appointment: PRN

## 2020-03-13 ENCOUNTER — Ambulatory Visit (INDEPENDENT_AMBULATORY_CARE_PROVIDER_SITE_OTHER): Payer: 59 | Admitting: Family Medicine

## 2020-03-19 NOTE — Progress Notes (Incomplete)
  PATIENT: Betty Cain DOB: 11/02/1975  REASON FOR VISIT: follow up HISTORY FROM: patient  HISTORY OF PRESENT ILLNESS: Today 03/19/20  HISTORY 12/20/19 Betty Cain is a 44-year-old female with history of daily headaches.  When last seen, she was switched from Aimovig to Ajovy, try Nurtec for acute headache.  Ajovy has yet to be approved, may have been delayed as she has been out of the country for 2 months in [place].  Took Nurtec with good benefit.  Had no injection of Aimovig for the last month, having daily migraine.  Headache is usually located left occipitally, will radiate forward right and left side.  She is sensitive to light and sound.  She may have nausea or vomiting.  Presents today for evaluation accompanied by interpreter.  She is still taking gabapentin, Toprol-XL, and nortriptyline.   REVIEW OF SYSTEMS: Out of a complete 14 system review of symptoms, the patient complains only of the following symptoms, and all other reviewed systems are negative.  ALLERGIES: Allergies  Allergen Reactions  . Topamax [Topiramate] Itching and Rash    HOME MEDICATIONS: Outpatient Medications Prior to Visit  Medication Sig Dispense Refill  . acetaminophen (TYLENOL) 500 MG tablet Take 500 mg by mouth every 6 (six) hours as needed.    . albuterol (VENTOLIN HFA) 108 (90 Base) MCG/ACT inhaler Inhale 2 puffs into the lungs every 6 (six) hours as needed for wheezing or shortness of breath. 8 g 2  . blood glucose meter kit and supplies Dispense based on patient and insurance preference. Use up to four times daily as directed. (FOR ICD-10 E10.9, E11.9). 1 each 0  . Blood Glucose Monitoring Suppl (ONETOUCH VERIO IQ SYSTEM) w/Device KIT 1 application by Does not apply route 4 (four) times daily. 1 kit 0  . Blood Pressure Monitoring DEVI 1 each by Does not apply route daily. 1 Device 0  . etonogestrel (NEXPLANON) 68 MG IMPL implant 1 each by Subdermal route once.    . ferrous sulfate (FEROSUL) 325 (65  FE) MG tablet TAKE 1 TABLET (325 MG TOTAL) BY MOUTH DAILY. 90 tablet 3  . fluticasone (FLONASE) 50 MCG/ACT nasal spray Place 2 sprays into both nostrils daily. 16 g 11  . fluticasone furoate-vilanterol (BREO ELLIPTA) 100-25 MCG/INH AEPB INHALE 1 PUFF INTO THE LUNGS DAILY. 60 each 3  . Fremanezumab-vfrm (AJOVY) 225 MG/1.5ML SOAJ Inject 225 mg into the skin every 30 (thirty) days. 1.5 mL 5  . gabapentin (NEURONTIN) 300 MG capsule 1 cap am and 2 QHs 270 capsule 3  . glipiZIDE (GLUCOTROL) 10 MG tablet Take 1 tablet (10 mg total) by mouth 2 (two) times daily before a meal. 180 tablet 3  . glucose blood test strip Use as instructed 100 each 12  . hydrocortisone (ANUSOL-HC) 2.5 % rectal cream Place 1 application rectally 2 (two) times daily. 30 g 1  . levocetirizine (XYZAL) 5 MG tablet Take 1 tablet (5 mg total) by mouth every evening. 90 tablet 3  . meloxicam (MOBIC) 15 MG tablet Take 1 tablet (15 mg total) by mouth daily. 30 tablet 11  . metFORMIN (GLUCOPHAGE) 500 MG tablet Take 1 tablet (500 mg total) by mouth 2 (two) times daily with a meal. 180 tablet 3  . methylcellulose (CITRUCEL) oral powder Take as directed, daily    . metoprolol succinate (TOPROL XL) 100 MG 24 hr tablet Take 1 tablet (100 mg total) by mouth daily. Take with or immediately following a meal. 90 tablet 3  . Multiple   Vitamin (MULTI VITAMIN DAILY PO) Take by mouth.    . nortriptyline (PAMELOR) 25 MG capsule Take 1 capsule (25 mg total) by mouth at bedtime. 90 capsule 3  . ondansetron (ZOFRAN-ODT) 8 MG disintegrating tablet TAKE 1 TABLET BY MOUTH EVERY 8 HOURS AS NEEDED FOR NAUSEA OR VOMITING. 30 tablet 0  . OneTouch Delica Lancets 33G MISC 1 application by Does not apply route in the morning, at noon, in the evening, and at bedtime. 100 each 11  . pantoprazole (PROTONIX) 40 MG tablet TAKE 1 TABLET BY MOUTH 2 TIMES DAILY BEFORE A MEAL. 180 tablet 1  . potassium chloride 20 MEQ/15ML (10%) SOLN Take 15 mLs (20 mEq total) by mouth daily.  473 mL 2  . Rimegepant Sulfate (NURTEC) 75 MG TBDP Take 1 tablet at the onset of migraine. 30 tablet 1  . rizatriptan (MAXALT) 10 MG tablet Take 1 tablet (10 mg total) by mouth 3 (three) times daily as needed for migraine. May repeat in 2 hours if needed 10 tablet 3  . rosuvastatin (CRESTOR) 5 MG tablet Take 1 tablet (5 mg total) by mouth at bedtime. 30 tablet 11  . Vitamin D, Ergocalciferol, (DRISDOL) 1.25 MG (50000 UNIT) CAPS capsule Take 1 capsule (50,000 Units total) by mouth every 7 (seven) days. 4 capsule 0   No facility-administered medications prior to visit.    PAST MEDICAL HISTORY: Past Medical History:  Diagnosis Date  . Allergy   . Anemia   . Anxiety 01/2019  . Asthma   . B12 deficiency   . Back pain   . Common migraine with intractable migraine 06/28/2017  . Constipation   . Diabetes (HCC) 02/2019  . Fatigue   . GERD (gastroesophageal reflux disease)    pos H pylori  . Hypertension    controlled with medication  . IBS (irritable bowel syndrome) 2005  . Joint pain   . Neonatal death    Vaginal delivery, full term-lived x2 hours.   . Palpitations   . Shortness of breath   . Shortness of breath on exertion   . Spinal headache   . Swelling of both lower extremities   . Valvular heart disease   . Vitamin D deficiency 02/2019    PAST SURGICAL HISTORY: Past Surgical History:  Procedure Laterality Date  . ADENOIDECTOMY     and tonsils (as a child)  . boil  2003   right elbow  . CESAREAN SECTION     x4  . CESAREAN SECTION  06/02/2011   Procedure: CESAREAN SECTION;  Surgeon: John V Ferguson, MD;  Location: [facility];  Service: Gynecology;  Laterality: N/A;  Primary Cesarean Section Delivery Baby Boy @ 0004, Apgars 9/9  . CESAREAN SECTION N/A 12/10/2013   Procedure: REPEAT CESAREAN SECTION;  Surgeon: Peggy Constant, MD;  Location: [facility];  Service: Obstetrics;  Laterality: N/A;  . CESAREAN SECTION    . colonoscopy  11/23/2018   stated had issues with sleep when in  for procedure  . OTHER SURGICAL HISTORY  2005   Uterine surgery   . uterine cauterization      FAMILY HISTORY: Family History  Problem Relation Age of Onset  . Diabetes Mother   . Diabetes Father   . Diabetes Sister   . Anesthesia problems Neg Hx   . Colon cancer Neg Hx   . Esophageal cancer Neg Hx   . Rectal cancer Neg Hx   . Stomach cancer Neg Hx   . Colon polyps Neg Hx       SOCIAL HISTORY: Social History   Socioeconomic History  . Marital status: Married    Spouse name: [name]  . Number of children: 4  . Years of education: Not on file  . Highest education level: Not on file  Occupational History  . Occupation: Unemployed  Tobacco Use  . Smoking status: Never Smoker  . Smokeless tobacco: Never Used  Vaping Use  . Vaping Use: Never used  Substance and Sexual Activity  . Alcohol use: No  . Drug use: No  . Sexual activity: Yes    Birth control/protection: None, Implant    Comment: pregnant  Other Topics Concern  . Not on file  Social History Narrative   Lives with husband and child   Caffeine use: daily (tea), sometimes coffee/soda   Right handed    Social Determinants of Health   Financial Resource Strain:   . Difficulty of Paying Living Expenses: Not on file  Food Insecurity: No Food Insecurity  . Worried About Running Out of Food in the Last Year: Never true  . Ran Out of Food in the Last Year: Never true  Transportation Needs:   . Lack of Transportation (Medical): Not on file  . Lack of Transportation (Non-Medical): Not on file  Physical Activity:   . Days of Exercise per Week: Not on file  . Minutes of Exercise per Session: Not on file  Stress:   . Feeling of Stress : Not on file  Social Connections:   . Frequency of Communication with Friends and Family: Not on file  . Frequency of Social Gatherings with Friends and Family: Not on file  . Attends Religious Services: Not on file  . Active Member of Clubs or Organizations: Not on file  .  Attends Club or Organization Meetings: Not on file  . Marital Status: Not on file  Intimate Partner Violence:   . Fear of Current or Ex-Partner: Not on file  . Emotionally Abused: Not on file  . Physically Abused: Not on file  . Sexually Abused: Not on file      PHYSICAL EXAM  There were no vitals filed for this visit. There is no height or weight on file to calculate BMI.  Generalized: Well developed, in no acute distress   Neurological examination  Mentation: Alert oriented to time, place, history taking. Follows all commands speech and language fluent Cranial nerve II-XII: Pupils were equal round reactive to light. Extraocular movements were full, visual field were full on confrontational test. Facial sensation and strength were normal. Uvula tongue midline. Head turning and shoulder shrug  were normal and symmetric. Motor: The motor testing reveals 5 over 5 strength of all 4 extremities. Good symmetric motor tone is noted throughout.  Sensory: Sensory testing is intact to soft touch on all 4 extremities. No evidence of extinction is noted.  Coordination: Cerebellar testing reveals good finger-nose-finger and heel-to-shin bilaterally.  Gait and station: Gait is normal. Tandem gait is normal. Romberg is negative. No drift is seen.  Reflexes: Deep tendon reflexes are symmetric and normal bilaterally.   DIAGNOSTIC DATA (LABS, IMAGING, TESTING) - I reviewed patient records, labs, notes, testing and imaging myself where available.  Lab Results  Component Value Date   WBC 7.4 01/10/2020   HGB 10.9 (L) 01/10/2020   HCT 34.4 01/10/2020   MCV 79 01/10/2020   PLT 442 01/10/2020      Component Value Date/Time   NA 139 01/02/2020 1027   K 4.6 01/02/2020 1027   CL 102   01/02/2020 1027   CO2 21 05/21/2019 0920   GLUCOSE 196 (H) 01/02/2020 1027   GLUCOSE 185 (H) 08/30/2018 1240   GLUCOSE 77 09/21/2013 1230   BUN 10 01/02/2020 1027   CREATININE 0.66 01/02/2020 1027   CREATININE  0.58 09/14/2016 0939   CALCIUM 9.8 01/02/2020 1027   PROT 7.6 01/02/2020 1027   ALBUMIN 4.6 01/02/2020 1027   AST 13 01/02/2020 1027   ALT 14 05/21/2019 0920   ALKPHOS 97 01/02/2020 1027   BILITOT 0.2 01/02/2020 1027   GFRNONAA 109 01/02/2020 1027   GFRNONAA >89 09/14/2016 0939   GFRAA 126 01/02/2020 1027   GFRAA >89 09/14/2016 0939   Lab Results  Component Value Date   CHOL 154 01/10/2020   HDL 88 01/10/2020   LDLCALC 53 01/10/2020   TRIG 65 01/10/2020   CHOLHDL 3.6 08/20/2019   Lab Results  Component Value Date   HGBA1C 7.5 (A) 01/02/2020   HGBA1C 7.5 01/02/2020   Lab Results  Component Value Date   VITAMINB12 344 01/10/2020   Lab Results  Component Value Date   TSH 3.160 01/10/2020      ASSESSMENT AND PLAN 44 y.o. year old female  has a past medical history of Allergy, Anemia, Anxiety (01/2019), Asthma, B12 deficiency, Back pain, Common migraine with intractable migraine (06/28/2017), Constipation, Diabetes (HCC) (02/2019), Fatigue, GERD (gastroesophageal reflux disease), Hypertension, IBS (irritable bowel syndrome) (2005), Joint pain, Neonatal death, Palpitations, Shortness of breath, Shortness of breath on exertion, Spinal headache, Swelling of both lower extremities, Valvular heart disease, and Vitamin D deficiency (02/2019). here with ***   I spent *** minutes of face-to-face and non-face-to-face time with patient.  This included previsit chart review, lab review, study review, order entry, electronic health record documentation, patient education.  Megan Millikan, MSN, NP-C 03/19/2020, 4:48 PM [facility] [address], [address] [place], [place] [address] [phone]   

## 2020-03-20 ENCOUNTER — Encounter: Payer: Medicaid Other | Admitting: Adult Health

## 2020-03-21 NOTE — Telephone Encounter (Signed)
Please advise on refills request.

## 2020-03-25 ENCOUNTER — Other Ambulatory Visit (INDEPENDENT_AMBULATORY_CARE_PROVIDER_SITE_OTHER): Payer: Self-pay | Admitting: Physician Assistant

## 2020-03-25 ENCOUNTER — Encounter (INDEPENDENT_AMBULATORY_CARE_PROVIDER_SITE_OTHER): Payer: Self-pay | Admitting: Physician Assistant

## 2020-03-25 ENCOUNTER — Ambulatory Visit (INDEPENDENT_AMBULATORY_CARE_PROVIDER_SITE_OTHER): Payer: 59 | Admitting: Physician Assistant

## 2020-03-25 ENCOUNTER — Other Ambulatory Visit: Payer: Self-pay

## 2020-03-25 ENCOUNTER — Encounter (INDEPENDENT_AMBULATORY_CARE_PROVIDER_SITE_OTHER): Payer: Self-pay

## 2020-03-25 VITALS — BP 98/65 | HR 90 | Temp 98.7°F | Ht <= 58 in | Wt 147.0 lb

## 2020-03-25 DIAGNOSIS — Z9189 Other specified personal risk factors, not elsewhere classified: Secondary | ICD-10-CM | POA: Diagnosis not present

## 2020-03-25 DIAGNOSIS — E559 Vitamin D deficiency, unspecified: Secondary | ICD-10-CM | POA: Diagnosis not present

## 2020-03-25 DIAGNOSIS — Z6830 Body mass index (BMI) 30.0-30.9, adult: Secondary | ICD-10-CM

## 2020-03-25 DIAGNOSIS — I10 Essential (primary) hypertension: Secondary | ICD-10-CM | POA: Diagnosis not present

## 2020-03-25 DIAGNOSIS — E669 Obesity, unspecified: Secondary | ICD-10-CM | POA: Diagnosis not present

## 2020-03-25 DIAGNOSIS — E119 Type 2 diabetes mellitus without complications: Secondary | ICD-10-CM

## 2020-03-25 MED ORDER — METFORMIN HCL ER 500 MG PO TB24: 1000.0000 mg | ORAL_TABLET | Freq: Every day | ORAL | 0 refills | Status: DC

## 2020-03-25 MED ORDER — METFORMIN HCL ER (MOD) 1000 MG PO TB24: 1000.0000 mg | ORAL_TABLET | Freq: Every day | ORAL | 0 refills | Status: DC

## 2020-03-25 MED ORDER — GLUCOSE BLOOD VI STRP: ORAL_STRIP | 0 refills | Status: DC

## 2020-03-25 MED ORDER — VITAMIN D (ERGOCALCIFEROL) 1.25 MG (50000 UNIT) PO CAPS: 50000.0000 [IU] | ORAL_CAPSULE | ORAL | 0 refills | Status: DC

## 2020-03-25 MED FILL — ONE TOUCH VERIO TEST STRIP: 30 days supply | Qty: 100 | Fill #0

## 2020-03-25 MED FILL — metFORMIN HCL ER 500 MG TB2: 500 | 30 days supply | Qty: 60 | Fill #0

## 2020-03-25 MED FILL — VIT D2 1.25 MG (50,000 UNIT: 1.25 MG | 28 days supply | Qty: 4 | Fill #0

## 2020-03-26 NOTE — Progress Notes (Signed)
Chief Complaint:   OBESITY Betty Cain is here to discuss her progress with her obesity treatment plan along with follow-up of her obesity related diagnoses. Betty Cain is on the Category 1 Plan and states she is following her eating plan approximately 100% of the time. Betty Cain states she is walking on the treadmill 25 minutes 3 times per week.  Today's visit was #: 5 Starting weight: 152 lbs Starting date: 01/10/2020 Today's weight: 147 lbs Today's date: 03/25/2020 Total lbs lost to date: 5 Total lbs lost since last in-office visit: 0  Interim History: Betty Cain reports hunger throughout the day, especially at breakfast. She states that she is doing a good job of eating on plan.  Subjective:   Diabetes mellitus with coincident hypertension ([place]). Fasting blood sugars 131-201. Betty Cain is not taking metformin twice daily due to stomach upset associated with the medication.   Lab Results  Component Value Date   HGBA1C 7.5 (A) 01/02/2020   HGBA1C 7.5 01/02/2020   HGBA1C 6.8 (A) 08/20/2019   HGBA1C 6.8 08/20/2019   HGBA1C 6.8 (A) 08/20/2019   HGBA1C 6.8 08/20/2019   Lab Results  Component Value Date   LDLCALC 53 01/10/2020   CREATININE 0.66 01/02/2020   Lab Results  Component Value Date   INSULIN 8.7 01/10/2020   Vitamin D deficiency. Betty Cain is on prescription Vitamin D. No nausea, vomiting, or muscle weakness. She reports energy level is low.   Ref. Range 01/10/2020 12:14  Vitamin D, 25-Hydroxy Latest Ref Range: 30.0 - 100.0 ng/mL 22.9 (L)   At risk for heart disease. Betty Cain is at a higher than average risk for cardiovascular disease due to obesity.   Assessment/Plan:   Diabetes mellitus with coincident hypertension ([place]). Good blood sugar control is important to decrease the likelihood of diabetic complications such as nephropathy, neuropathy, limb loss, blindness, coronary artery disease, and death. Intensive lifestyle modification including diet, exercise and weight loss are the first  line of treatment for diabetes. Will change to metFORMIN (GLUCOPHAGE-XR) 500 MG 24 hr 2 tablets daily with breakfast #60 with 0 refills. Betty Cain will discontinue the other metformin. Refill was given for glucose blood test strip, #100 with 0 refills.  Vitamin D deficiency. Low Vitamin D level contributes to fatigue and are associated with obesity, breast, and colon cancer. She was given a refill on her Vitamin D, Ergocalciferol, (DRISDOL) 1.25 MG (50000 UNIT) CAPS capsule every week #4 with 0 refills and will follow-up for routine testing of Vitamin D, at least 2-3 times per year to avoid over-replacement.   At risk for heart disease. Betty Cain was given approximately 15 minutes of coronary artery disease prevention counseling today. She is 44 y.o. female and has risk factors for heart disease including obesity. We discussed intensive lifestyle modifications today with an emphasis on specific weight loss instructions and strategies.   Repetitive spaced learning was employed today to elicit superior memory formation and behavioral change.  Class 1 obesity with serious comorbidity and body mass index (BMI) of 30.0 to 30.9 in adult, unspecified obesity type.  Betty Cain is currently in the action stage of change. As such, her goal is to continue with weight loss efforts. She has agreed to change plans and will now follow the Category 2 Plan.   Exercise goals: For substantial health benefits, adults should do at least 150 minutes (2 hours and 30 minutes) a week of moderate-intensity, or 75 minutes (1 hour and 15 minutes) a week of vigorous-intensity aerobic physical activity, or an  equivalent combination of moderate- and vigorous-intensity aerobic activity. Aerobic activity should be performed in episodes of at least 10 minutes, and preferably, it should be spread throughout the week.  Behavioral modification strategies: meal planning and cooking strategies and keeping healthy foods in the home.  Betty Cain has agreed to  follow-up with our clinic in 3 weeks. She was informed of the importance of frequent follow-up visits to maximize her success with intensive lifestyle modifications for her multiple health conditions.   Objective:   Blood pressure 98/65, pulse 90, temperature 98.7 F (37.1 C), height 4\' 10"  (1.473 m), weight 147 lb (66.7 kg), SpO2 98 %. Body mass index is 30.72 kg/m.  General: Cooperative, alert, well developed, in no acute distress. HEENT: Conjunctivae and lids unremarkable. Cardiovascular: Regular rhythm.  Lungs: Normal work of breathing. Neurologic: No focal deficits.   Lab Results  Component Value Date   CREATININE 0.66 01/02/2020   BUN 10 01/02/2020   NA 139 01/02/2020   K 4.6 01/02/2020   CL 102 01/02/2020   CO2 21 05/21/2019   Lab Results  Component Value Date   ALT 14 05/21/2019   AST 13 01/02/2020   ALKPHOS 97 01/02/2020   BILITOT 0.2 01/02/2020   Lab Results  Component Value Date   HGBA1C 7.5 (A) 01/02/2020   HGBA1C 7.5 01/02/2020   HGBA1C 6.8 (A) 08/20/2019   HGBA1C 6.8 08/20/2019   HGBA1C 6.8 (A) 08/20/2019   HGBA1C 6.8 08/20/2019   Lab Results  Component Value Date   INSULIN 8.7 01/10/2020   Lab Results  Component Value Date   TSH 3.160 01/10/2020   Lab Results  Component Value Date   CHOL 154 01/10/2020   HDL 88 01/10/2020   LDLCALC 53 01/10/2020   TRIG 65 01/10/2020   CHOLHDL 3.6 08/20/2019   Lab Results  Component Value Date   WBC 7.4 01/10/2020   HGB 10.9 (L) 01/10/2020   HCT 34.4 01/10/2020   MCV 79 01/10/2020   PLT 442 01/10/2020   Lab Results  Component Value Date   IRON 53 01/20/2018   TIBC 317 01/20/2018   FERRITIN 61 01/20/2018   Attestation Statements:   Reviewed by clinician on day of visit: allergies, medications, problem list, medical history, surgical history, family history, social history, and previous encounter notes.  IMichaelene Song, am acting as transcriptionist for Abby Potash, PA-C   I have reviewed the  above documentation for accuracy and completeness, and I agree with the above. Abby Potash, PA-C

## 2020-03-27 MED FILL — AJOVY 225 MG/1.5ML SOAJ: 225 | 30 days supply | Qty: 2 | Fill #2

## 2020-04-02 ENCOUNTER — Other Ambulatory Visit: Payer: Self-pay | Admitting: Nurse Practitioner

## 2020-04-02 ENCOUNTER — Ambulatory Visit (INDEPENDENT_AMBULATORY_CARE_PROVIDER_SITE_OTHER): Payer: Medicaid Other | Admitting: Nurse Practitioner

## 2020-04-02 ENCOUNTER — Other Ambulatory Visit: Payer: Self-pay

## 2020-04-02 ENCOUNTER — Encounter: Payer: Self-pay | Admitting: Nurse Practitioner

## 2020-04-02 VITALS — BP 120/76 | HR 91 | Temp 98.4°F | Resp 18 | Ht <= 58 in | Wt 150.2 lb

## 2020-04-02 DIAGNOSIS — E1165 Type 2 diabetes mellitus with hyperglycemia: Secondary | ICD-10-CM

## 2020-04-02 DIAGNOSIS — Z3046 Encounter for surveillance of implantable subdermal contraceptive: Secondary | ICD-10-CM

## 2020-04-02 LAB — POCT URINALYSIS DIP (CLINITEK)
Bilirubin, UA: NEGATIVE
Glucose, UA: NEGATIVE mg/dL
Ketones, POC UA: NEGATIVE mg/dL
Leukocytes, UA: NEGATIVE
Nitrite, UA: NEGATIVE
POC PROTEIN,UA: 30 — AB
Spec Grav, UA: 1.02 (ref 1.010–1.025)
Urobilinogen, UA: 0.2 E.U./dL
pH, UA: 5.5 (ref 5.0–8.0)

## 2020-04-02 MED ORDER — VITAMIN B-12 1000 MCG PO TABS: 5000.0000 ug | ORAL_TABLET | Freq: Every day | ORAL | 3 refills | Status: AC

## 2020-04-02 MED ORDER — NORGESTIMATE-ETH ESTRADIOL 0.25-35 MG-MCG PO TABS: 1.0000 | ORAL_TABLET | Freq: Every day | ORAL | 11 refills | Status: DC

## 2020-04-02 MED FILL — MONO-LINYAH 28 TABLET: 0.25-35 | 28 days supply | Qty: 28 | Fill #0

## 2020-04-02 NOTE — Progress Notes (Signed)
Established Patient Office Visit  Subjective:  Patient ID: Betty Cain, female    DOB: [birth year]  Age: 44 y.o. MRN: [id number]  CC:  Chief Complaint  Patient presents with  . Follow-up Pt reports abdominal/chest/eye pain, all have "been going on for awhile" but worse times the last 3 days, abdominal pain feels like squeezing, has had 3 BMs this AM but still feels like need to go some more, 8/10 today  Chest pain sharp, occurring for a while but worse recently, 7/10 right now, has about 3 episodes per day w/ racing heart rate, has shortness of breath w/ episodes   Reports no period x 3 months, implant still in place but was due for removal in Sept, has appt in Jan for this     HPI Betty Cain presents for follow up. She  has a past medical history of Allergy, Anemia, Anxiety (01/2019), Asthma, B12 deficiency, Back pain, Common migraine with intractable migraine (06/28/2017), Constipation, Diabetes ([name]) (02/2019), Fatigue, GERD (gastroesophageal reflux disease), Hypertension, IBS (irritable bowel syndrome) 06/26/03), Joint pain, Neonatal death, Palpitations, Shortness of breath, Shortness of breath on exertion, Spinal headache, Swelling of both lower extremities, Valvular heart disease, and Vitamin D deficiency (02/2019).   Betty Cain has multiple complaints. However she is been followed by cardiology, neurology, gastroenterology, physical therapy and nutrition specialist .  Her main concern is whether she can discontinue her medications because "they are not working".  Diabetes Mellitus Patient presents for follow up of diabetes. Current symptoms include: hyperglycemia. Symptoms have stabilized. Patient denies foot ulcerations, increased appetite, nausea, polydipsia, polyuria, visual disturbances, vomiting and weight loss. Evaluation to date has included: fasting blood sugar, fasting lipid panel, hemoglobin A1C and microalbuminuria.  Home sugars: BGs are running  consistent with Hgb A1C.  Current treatment: Continued sulfonylurea which has been somewhat effective and Continued metformin which has been somewhat effective.  She is also requesting removal of Nexaplanon.   Past Medical History:  Diagnosis Date  . Allergy   . Anemia   . Anxiety 01/2019  . Asthma   . B12 deficiency   . Back pain   . Common migraine with intractable migraine 06/28/2017  . Constipation   . Diabetes ([name]) 02/2019  . Fatigue   . GERD (gastroesophageal reflux disease)    pos H pylori  . Hypertension    controlled with medication  . IBS (irritable bowel syndrome) Jun 26, 2003  . Joint pain   . Neonatal death    Vaginal delivery, full term-lived x2 hours.   . Palpitations   . Shortness of breath   . Shortness of breath on exertion   . Spinal headache   . Swelling of both lower extremities   . Valvular heart disease   . Vitamin D deficiency 02/2019    Past Surgical History:  Procedure Laterality Date  . ADENOIDECTOMY     and tonsils (as a child)  . boil  06-25-01   right elbow  . CESAREAN SECTION     x4  . CESAREAN SECTION  06/02/2011   Procedure: CESAREAN SECTION;  Surgeon: [name], MD;  Location: [facility];  Service: Gynecology;  Laterality: N/A;  Primary Cesarean Section Delivery Baby Boy @ 0004, Apgars 9/9  . CESAREAN SECTION N/A 12/10/2013   Procedure: REPEAT CESAREAN SECTION;  Surgeon: [name], MD;  Location: [facility];  Service: Obstetrics;  Laterality: N/A;  . CESAREAN SECTION    . colonoscopy  11/23/2018   stated had issues with sleep when in  for procedure  . OTHER SURGICAL HISTORY  2005   Uterine surgery   . uterine cauterization      Family History  Problem Relation Age of Onset  . Diabetes Mother   . Diabetes Father   . Diabetes Sister   . Anesthesia problems Neg Hx   . Colon cancer Neg Hx   . Esophageal cancer Neg Hx   . Rectal cancer Neg Hx   . Stomach cancer Neg Hx   . Colon polyps Neg Hx     Social History   Socioeconomic History  . Marital status:  Married    Spouse name: [name]  . Number of children: 4  . Years of education: Not on file  . Highest education level: Not on file  Occupational History  . Occupation: Unemployed  Tobacco Use  . Smoking status: Never Smoker  . Smokeless tobacco: Never Used  Vaping Use  . Vaping Use: Never used  Substance and Sexual Activity  . Alcohol use: No  . Drug use: No  . Sexual activity: Yes    Birth control/protection: None, Implant    Comment: pregnant  Other Topics Concern  . Not on file  Social History Narrative   Lives with husband and child   Caffeine use: daily (tea), sometimes coffee/soda   Right handed    Social Determinants of Health   Financial Resource Strain: Not on file  Food Insecurity: No Food Insecurity  . Worried About Charity fundraiser in the Last Year: Never true  . Ran Out of Food in the Last Year: Never true  Transportation Needs: Not on file  Physical Activity: Not on file  Stress: Not on file  Social Connections: Not on file  Intimate Partner Violence: Not on file    Outpatient Medications Prior to Visit  Medication Sig Dispense Refill  . acetaminophen (TYLENOL) 500 MG tablet Take 500 mg by mouth every 6 (six) hours as needed.    Marland Kitchen albuterol (VENTOLIN HFA) 108 (90 Base) MCG/ACT inhaler Inhale 2 puffs into the lungs every 6 (six) hours as needed for wheezing or shortness of breath. 8 g 2  . blood glucose meter kit and supplies Dispense based on patient and insurance preference. Use up to four times daily as directed. (FOR ICD-10 E10.9, E11.9). 1 each 0  . Blood Glucose Monitoring Suppl (ONETOUCH VERIO IQ SYSTEM) w/Device KIT 1 application by Does not apply route 4 (four) times daily. 1 kit 0  . Blood Pressure Monitoring DEVI 1 each by Does not apply route daily. 1 Device 0  . ferrous sulfate (FEROSUL) 325 (65 FE) MG tablet TAKE 1 TABLET (325 MG TOTAL) BY MOUTH DAILY. 90 tablet 3  . fluticasone (FLONASE) 50 MCG/ACT nasal spray Place 2 sprays into both  nostrils daily. 16 g 11  . fluticasone furoate-vilanterol (BREO ELLIPTA) 100-25 MCG/INH AEPB INHALE 1 PUFF INTO THE LUNGS DAILY. 60 each 3  . Fremanezumab-vfrm (AJOVY) 225 MG/1.5ML SOAJ Inject 225 mg into the skin every 30 (thirty) days. 1.5 mL 5  . gabapentin (NEURONTIN) 300 MG capsule 1 cap am and 2 QHs 270 capsule 3  . glipiZIDE (GLUCOTROL) 10 MG tablet Take 1 tablet (10 mg total) by mouth 2 (two) times daily before a meal. 180 tablet 3  . glucose blood test strip Use as instructed 100 each 0  . hydrocortisone (ANUSOL-HC) 2.5 % rectal cream Place 1 application rectally 2 (two) times daily. 30 g 1  . levocetirizine (XYZAL) 5 MG tablet Take  1 tablet (5 mg total) by mouth every evening. 90 tablet 3  . meloxicam (MOBIC) 15 MG tablet Take 1 tablet (15 mg total) by mouth daily. 30 tablet 11  . metFORMIN (GLUCOPHAGE-XR) 500 MG 24 hr tablet Take 2 tablets (1,000 mg total) by mouth daily with breakfast. 60 tablet 0  . methylcellulose (CITRUCEL) oral powder Take as directed, daily    . metoprolol succinate (TOPROL XL) 100 MG 24 hr tablet Take 1 tablet (100 mg total) by mouth daily. Take with or immediately following a meal. 90 tablet 3  . Multiple Vitamin (MULTI VITAMIN DAILY PO) Take by mouth.    . nortriptyline (PAMELOR) 25 MG capsule Take 1 capsule (25 mg total) by mouth at bedtime. 90 capsule 3  . ondansetron (ZOFRAN-ODT) 8 MG disintegrating tablet TAKE 1 TABLET BY MOUTH EVERY 8 HOURS AS NEEDED FOR NAUSEA OR VOMITING. 30 tablet 0  . OneTouch Delica Lancets 61U MISC 1 application by Does not apply route in the morning, at noon, in the evening, and at bedtime. 100 each 11  . pantoprazole (PROTONIX) 40 MG tablet TAKE 1 TABLET BY MOUTH 2 TIMES DAILY BEFORE A MEAL. 180 tablet 1  . potassium chloride 20 MEQ/15ML (10%) SOLN Take 15 mLs (20 mEq total) by mouth daily. 473 mL 2  . Rimegepant Sulfate (NURTEC) 75 MG TBDP Take 1 tablet at the onset of migraine. 30 tablet 1  . rizatriptan (MAXALT) 10 MG tablet  Take 1 tablet (10 mg total) by mouth 3 (three) times daily as needed for migraine. May repeat in 2 hours if needed 10 tablet 3  . rosuvastatin (CRESTOR) 5 MG tablet Take 1 tablet (5 mg total) by mouth at bedtime. 30 tablet 11  . Vitamin D, Ergocalciferol, (DRISDOL) 1.25 MG (50000 UNIT) CAPS capsule Take 1 capsule (50,000 Units total) by mouth every 7 (seven) days. 4 capsule 0  . etonogestrel (NEXPLANON) 68 MG IMPL implant 1 each by Subdermal route once.     No facility-administered medications prior to visit.    Allergies  Allergen Reactions  . Topamax [Topiramate] Itching and Rash    ROS Review of Systems  Cardiovascular: Positive for chest pain.  Gastrointestinal: Positive for abdominal pain.  Neurological: Positive for dizziness.      Objective:    Physical Exam Constitutional:      General: She is not in acute distress.    Appearance: She is not ill-appearing, toxic-appearing or diaphoretic.  HENT:     Head: Normocephalic and atraumatic.     Nose: Nose normal.     Mouth/Throat:     Mouth: Mucous membranes are moist.  Cardiovascular:     Rate and Rhythm: Normal rate and regular rhythm.     Pulses: Normal pulses.     Heart sounds: Normal heart sounds.  Pulmonary:     Effort: Pulmonary effort is normal.     Breath sounds: Normal breath sounds.  Abdominal:     Tenderness: There is abdominal tenderness.     Comments: RLQ  Musculoskeletal:     Cervical back: Normal range of motion.  Skin:    General: Skin is warm.     Capillary Refill: Capillary refill takes less than 2 seconds.  Neurological:     General: No focal deficit present.     Mental Status: She is alert and oriented to person, place, and time.  Psychiatric:        Mood and Affect: Mood normal.  Behavior: Behavior normal.        Thought Content: Thought content normal.        Judgment: Judgment normal.   Procedure Note The patient was supine with left arm flexed with her hand behind her head.  Under clean technique the Nexplanon catheter was located and the previous insertion site ;The site was cleaned with chloraprep   it was injected with 1 ml of 2% lidocaine despite the area resembling numbness and pt admitted that she was still able to feel so an additional 1 ml of 2% lidocaine was injected under the skin. Under  After several attempt due to the increased dept of the catheter was located and removed. The catheter was intact and  measured 4 cm. steri strips were applied along with bandaid and a pressure dressing.   BP 120/76 (BP Location: Right Arm, Patient Position: Sitting, Cuff Size: Normal)   Pulse 91   Temp 98.4 F (36.9 C) (Temporal)   Resp 18   Wt 150 lb 3.2 oz (68.1 kg)   SpO2 100%   BMI 31.39 kg/m  Wt Readings from Last 3 Encounters:  04/02/20 150 lb 3.2 oz (68.1 kg)  03/25/20 147 lb (66.7 kg)  02/27/20 146 lb (66.2 kg)     Health Maintenance Due  Topic Date Due  . Hepatitis C Screening  Never done  . PNEUMOCOCCAL POLYSACCHARIDE VACCINE AGE 66-64 HIGH RISK  Never done  . OPHTHALMOLOGY EXAM  Never done  . PAP SMEAR-Modifier  06/19/2016  . URINE MICROALBUMIN  05/20/2020    There are no preventive care reminders to display for this patient.  Lab Results  Component Value Date   TSH 3.160 01/10/2020   Lab Results  Component Value Date   WBC 7.4 01/10/2020   HGB 10.9 (L) 01/10/2020   HCT 34.4 01/10/2020   MCV 79 01/10/2020   PLT 442 01/10/2020   Lab Results  Component Value Date   NA 139 01/02/2020   K 4.6 01/02/2020   CO2 21 05/21/2019   GLUCOSE 196 (H) 01/02/2020   BUN 10 01/02/2020   CREATININE 0.66 01/02/2020   BILITOT 0.2 01/02/2020   ALKPHOS 97 01/02/2020   AST 13 01/02/2020   ALT 14 05/21/2019   PROT 7.6 01/02/2020   ALBUMIN 4.6 01/02/2020   CALCIUM 9.8 01/02/2020   ANIONGAP 11 08/30/2018   GFR 119.42 08/28/2018   Lab Results  Component Value Date   CHOL 154 01/10/2020   Lab Results  Component Value Date   HDL 88 01/10/2020    Lab Results  Component Value Date   LDLCALC 53 01/10/2020   Lab Results  Component Value Date   TRIG 65 01/10/2020   Lab Results  Component Value Date   CHOLHDL 3.6 08/20/2019   Lab Results  Component Value Date   HGBA1C 7.5 (A) 01/02/2020   HGBA1C 7.5 01/02/2020      Assessment & Plan:   Problem List Items Addressed This Visit      Endocrine   Type 2 diabetes mellitus with hyperglycemia, without long-term current use of insulin ([place]) - Primary Had a long discussion with the patient about her current treatment regimen and if she discontinues what the results will be. Encourage regular CBG monitoring  Also discussed each medication that she was taking and the use. She wrote down why she was taking each medication including oral, inhalers and injectables.     Relevant Orders   Glucose (CBG)   POCT URINALYSIS DIP (CLINITEK) (Completed)  Other Visit Diagnoses    Nexplanon removal     Discharge instructions provided      Meds ordered this encounter  Medications  . vitamin B-12 (CYANOCOBALAMIN) 1000 MCG tablet    Sig: Take 5 tablets (5,000 mcg total) by mouth daily.    Dispense:  450 tablet    Refill:  3    Do not place medication on "Automatic Refill". Fill one day early if pharmacy is closed on scheduled refill date.    Order Specific Question:   Supervising Provider    Answer:   Tresa Garter [id number]  . norgestimate-ethinyl estradiol (SPRINTEC 28) 0.25-35 MG-MCG tablet    Sig: Take 1 tablet by mouth daily.    Dispense:  30 tablet    Refill:  11    Order Specific Question:   Supervising Provider    Answer:   Tresa Garter [[id number]]    Follow-up: Return in about 6 months (around 10/01/2020).    Vevelyn Francois, NP  04/04/2020

## 2020-04-02 NOTE — Progress Notes (Signed)
Pt reports abdominal/chest/eye pain, all have "been going on for awhile" but worse times the last 3 days, abdominal pain feels like squeezing, has had 3 BMs this AM but still feels like need to go some more, 8/10 today  Chest pain sharp, occurring for a while but worse recently, 7/10 right now, has about 3 episodes per day w/ racing heart rate, has shortness of breath w/ episodes   Reports no period x 3 months, implant still in place but was due for removal in Sept, has appt in Jan for this

## 2020-04-14 ENCOUNTER — Ambulatory Visit (INDEPENDENT_AMBULATORY_CARE_PROVIDER_SITE_OTHER): Payer: 59 | Admitting: Physician Assistant

## 2020-04-18 ENCOUNTER — Other Ambulatory Visit: Payer: Self-pay | Admitting: Family Medicine

## 2020-04-18 ENCOUNTER — Other Ambulatory Visit: Payer: Self-pay | Admitting: Nurse Practitioner

## 2020-04-18 ENCOUNTER — Other Ambulatory Visit: Payer: Self-pay | Admitting: Gastroenterology

## 2020-04-18 DIAGNOSIS — E876 Hypokalemia: Secondary | ICD-10-CM

## 2020-04-18 MED FILL — POTASSIUM CHLORIDE 20 MEQ/1: 20 MEQ/15ML | 30 days supply | Qty: 473 | Fill #0

## 2020-04-18 MED FILL — MELOXICAM 15 MG TABLET: 15 | 30 days supply | Qty: 30 | Fill #1

## 2020-04-18 MED FILL — SUCRALFATE 1 GM/10ML SUSP: 1 | 10 days supply | Qty: 420 | Fill #0

## 2020-04-23 ENCOUNTER — Telehealth (INDEPENDENT_AMBULATORY_CARE_PROVIDER_SITE_OTHER): Payer: Medicaid Other | Admitting: Physician Assistant

## 2020-04-23 ENCOUNTER — Encounter (INDEPENDENT_AMBULATORY_CARE_PROVIDER_SITE_OTHER): Payer: Self-pay

## 2020-04-23 MED FILL — ROSUVASTATIN CALCIUM 5 MG T: 5 | 60 days supply | Qty: 60 | Fill #7

## 2020-04-23 MED FILL — LEVOCETIRIZINE 5 MG TABLET: 5 | 30 days supply | Qty: 30 | Fill #2

## 2020-04-23 MED FILL — AJOVY 225 MG/1.5ML SOAJ: 225 | 30 days supply | Qty: 2 | Fill #3

## 2020-04-23 NOTE — Telephone Encounter (Signed)
Message routed to PCP Vevelyn Francois, NP . Please Advise.

## 2020-05-07 MED FILL — VIT D2 1.25 MG (50,000 UNIT: 1.25 MG | 28 days supply | Qty: 4 | Fill #0

## 2020-05-07 MED FILL — FLUTICASONE PROP 50 MCG SPR: 50 | 30 days supply | Qty: 16 | Fill #1

## 2020-05-13 NOTE — Progress Notes (Signed)
Error

## 2020-05-14 ENCOUNTER — Encounter (INDEPENDENT_AMBULATORY_CARE_PROVIDER_SITE_OTHER): Payer: Self-pay | Admitting: Physician Assistant

## 2020-05-14 ENCOUNTER — Other Ambulatory Visit: Payer: Self-pay

## 2020-05-14 ENCOUNTER — Ambulatory Visit (INDEPENDENT_AMBULATORY_CARE_PROVIDER_SITE_OTHER): Payer: Medicaid Other | Admitting: Physician Assistant

## 2020-05-14 VITALS — BP 130/79 | HR 98 | Temp 98.3°F | Ht <= 58 in | Wt 149.0 lb

## 2020-05-14 DIAGNOSIS — Z6831 Body mass index (BMI) 31.0-31.9, adult: Secondary | ICD-10-CM | POA: Diagnosis not present

## 2020-05-14 DIAGNOSIS — I152 Hypertension secondary to endocrine disorders: Secondary | ICD-10-CM | POA: Diagnosis not present

## 2020-05-14 DIAGNOSIS — E1159 Type 2 diabetes mellitus with other circulatory complications: Secondary | ICD-10-CM | POA: Diagnosis not present

## 2020-05-14 DIAGNOSIS — I10 Essential (primary) hypertension: Secondary | ICD-10-CM | POA: Diagnosis not present

## 2020-05-14 DIAGNOSIS — E669 Obesity, unspecified: Secondary | ICD-10-CM

## 2020-05-15 NOTE — Progress Notes (Signed)
Chief Complaint:   OBESITY Betty Cain is here to discuss her progress with her obesity treatment plan along with follow-up of her obesity related diagnoses. Betty Cain is on the Category 2 Plan and states she is following her eating plan approximately 100% of the time. Betty Cain states she is walking for 15-20 minutes 3 times per week.  Today's visit was #: 6 Starting weight: 152 lbs Starting date: 01/10/2020 Today's weight: 149 lbs Today's date: 05/14/2020 Total lbs lost to date: 3 Total lbs lost since last in-office visit: 0  Interim History: Betty Cain reports that she has been sick the past few Cain. Her primary care provider recommended that she go to an urgent care but she has not gone. She is not eating on the plan due to not feeling well. She has been eating rice with chicken, and macaroni.  Subjective:   1. Essential hypertension Betty Cain's hypertension is managed by her primary care provider. Her blood pressure is controlled on Toprol. She denies chest pain or headache.  2. Type 2 diabetes mellitus associated with hypertension ([place]) Betty Cain's diabetes mellitus is managed by her primary care provider. She is on glipizide and metformin. She is not regularly checking her blood sugars at home.   Assessment/Plan:   1. Essential hypertension Betty Cain will continue her medications, and will continue working on healthy weight loss and exercise to improve blood pressure control. We will watch for signs of hypotension as she continues her lifestyle modifications. She will follow up with her primary care provider.  2. Type 2 diabetes mellitus associated with hypertension ([facility]) Betty Cain blood sugar control is important to decrease the likelihood of diabetic complications such as nephropathy, neuropathy, limb loss, blindness, coronary artery disease, and death. Intensive lifestyle modification including diet, exercise and weight loss are the first line of treatment for diabetes. Betty Cain will monitor her BGs and will continue  her medications as prescribed. She will continue to follow up with her primary care provider.  3. Class 1 obesity with serious comorbidity and body mass index (BMI) of 31.0 to 31.9 in adult, unspecified obesity type Betty Cain is currently in the action stage of change. As such, her goal is to continue with weight loss efforts. She has agreed to the Category 2 Plan.   Urgent care information was given to the patient today, of places that accept Medicaid.  Exercise goals: As is.  Behavioral modification strategies: increasing lean protein intake, decreasing simple carbohydrates and no skipping meals.  Betty Cain. She was informed of the importance of frequent follow-up visits to maximize her success with intensive lifestyle modifications for her multiple health conditions.   Objective:   Blood pressure 130/79, pulse 98, temperature 98.3 F (36.8 C), height 4\' 10"  (1.473 m), weight 149 lb (67.6 kg), SpO2 98 %. Body mass index is 31.14 kg/m.  General: Cooperative, alert, well developed, in no acute distress. HEENT: Conjunctivae and lids unremarkable. Cardiovascular: Regular rhythm.  Lungs: Normal work of breathing. Neurologic: No focal deficits.   Lab Results  Component Value Date   CREATININE 0.66 01/02/2020   BUN 10 01/02/2020   NA 139 01/02/2020   K 4.6 01/02/2020   CL 102 01/02/2020   CO2 21 05/21/2019   Lab Results  Component Value Date   ALT 14 05/21/2019   AST 13 01/02/2020   ALKPHOS 97 01/02/2020   BILITOT 0.2 01/02/2020   Lab Results  Component Value Date   HGBA1C 7.5 (  A) 01/02/2020   HGBA1C 7.5 01/02/2020   HGBA1C 6.8 (A) 08/20/2019   HGBA1C 6.8 08/20/2019   HGBA1C 6.8 (A) 08/20/2019   HGBA1C 6.8 08/20/2019   Lab Results  Component Value Date   INSULIN 8.7 01/10/2020   Lab Results  Component Value Date   TSH 3.160 01/10/2020   Lab Results  Component Value Date   CHOL 154 01/10/2020   HDL 88 01/10/2020    LDLCALC 53 01/10/2020   TRIG 65 01/10/2020   CHOLHDL 3.6 08/20/2019   Lab Results  Component Value Date   WBC 7.4 01/10/2020   HGB 10.9 (L) 01/10/2020   HCT 34.4 01/10/2020   MCV 79 01/10/2020   PLT 442 01/10/2020   Lab Results  Component Value Date   IRON 53 01/20/2018   TIBC 317 01/20/2018   FERRITIN 61 01/20/2018   Attestation Statements:   Reviewed by clinician on day of visit: allergies, medications, problem list, medical history, surgical history, family history, social history, and previous encounter notes.   Wilhemena Durie, am acting as transcriptionist for [employer], PA-C.  I have reviewed the above documentation for accuracy and completeness, and I agree with the above. Abby Potash, PA-C

## 2020-05-21 MED FILL — METOPROLOL SUCCINATE ER 100: 100 | 90 days supply | Qty: 90 | Fill #2

## 2020-05-21 MED FILL — LEVOCETIRIZINE 5 MG TABLET: 5 | 30 days supply | Qty: 30 | Fill #0

## 2020-05-21 MED FILL — MELOXICAM 15 MG TABLET: 15 | 30 days supply | Qty: 30 | Fill #2

## 2020-05-28 ENCOUNTER — Other Ambulatory Visit (INDEPENDENT_AMBULATORY_CARE_PROVIDER_SITE_OTHER): Payer: Self-pay | Admitting: Physician Assistant

## 2020-05-28 DIAGNOSIS — E559 Vitamin D deficiency, unspecified: Secondary | ICD-10-CM

## 2020-05-28 MED FILL — AJOVY 225 MG/1.5ML SOAJ: 225 | 30 days supply | Qty: 2 | Fill #4

## 2020-05-28 NOTE — Telephone Encounter (Signed)
Last seen by Tracey Aguilar, PA-C. 

## 2020-05-29 ENCOUNTER — Ambulatory Visit (INDEPENDENT_AMBULATORY_CARE_PROVIDER_SITE_OTHER): Payer: 59 | Admitting: Physician Assistant

## 2020-05-29 ENCOUNTER — Encounter (INDEPENDENT_AMBULATORY_CARE_PROVIDER_SITE_OTHER): Payer: Self-pay | Admitting: Physician Assistant

## 2020-05-29 ENCOUNTER — Telehealth: Payer: Self-pay | Admitting: Nurse Practitioner

## 2020-05-29 ENCOUNTER — Other Ambulatory Visit: Payer: Self-pay

## 2020-05-29 ENCOUNTER — Other Ambulatory Visit (INDEPENDENT_AMBULATORY_CARE_PROVIDER_SITE_OTHER): Payer: Self-pay | Admitting: Physician Assistant

## 2020-05-29 VITALS — BP 105/72 | HR 92 | Temp 99.0°F | Ht <= 58 in | Wt 149.0 lb

## 2020-05-29 DIAGNOSIS — I152 Hypertension secondary to endocrine disorders: Secondary | ICD-10-CM

## 2020-05-29 DIAGNOSIS — E559 Vitamin D deficiency, unspecified: Secondary | ICD-10-CM | POA: Diagnosis not present

## 2020-05-29 DIAGNOSIS — Z6831 Body mass index (BMI) 31.0-31.9, adult: Secondary | ICD-10-CM

## 2020-05-29 DIAGNOSIS — E1159 Type 2 diabetes mellitus with other circulatory complications: Secondary | ICD-10-CM

## 2020-05-29 DIAGNOSIS — E669 Obesity, unspecified: Secondary | ICD-10-CM

## 2020-05-29 MED ORDER — VITAMIN D (ERGOCALCIFEROL) 1.25 MG (50000 UNIT) PO CAPS: 50000.0000 [IU] | ORAL_CAPSULE | ORAL | 0 refills | Status: DC

## 2020-05-29 NOTE — Telephone Encounter (Signed)
Pt spouse called requesting provider return call with interpreter to discuss and confirm medications.  Pt [phone].

## 2020-05-29 NOTE — Telephone Encounter (Signed)
Don't send... I am seeing her today. Thanks

## 2020-06-02 MED FILL — PANTOPRAZOLE SOD DR 40 MG T: 40 | 90 days supply | Qty: 180 | Fill #0

## 2020-06-02 NOTE — Progress Notes (Signed)
Chief Complaint:   OBESITY Betty Cain is here to discuss her progress with her obesity treatment plan along with follow-up of her obesity related diagnoses. Betty Cain is on the Category 2 Plan and states she is following her eating plan approximately 100% of the time. Betty Cain states she is walking on the treadmill for 15-30 minutes 3-4 times per week.  Today's visit was #: 7 Starting weight: 152 lbs Starting date: 01/10/2020 Today's weight: 149 lbs Today's date: 05/29/2020 Total lbs lost to date: 3 Total lbs lost since last in-office visit: 0  Interim History: Betty Cain continue to drank tea with buttermilk. She is eating eggs, toast, and yogurt for breakfast and a microwave meal for lunch. Her microwave meals do not have enough protein.  Subjective:   1. Vitamin D deficiency Betty Cain is on Vit D weekly, and she is tolerating it well.   2. Type 2 diabetes mellitus associated with hypertension (Betty Cain) Betty Cain is on metformin and glipizide. She is not checking her BGs regularly. She had a hypoglycemic episode of 57.  Assessment/Plan:   1. Vitamin D deficiency Low Vitamin D level contributes to fatigue and are associated with obesity, breast, and colon cancer. We will refill prescription Vitamin D for 1 month. Betty Cain will follow-up for routine testing of Vitamin D, at least 2-3 times per year to avoid over-replacement.  - Vitamin D, Ergocalciferol, (DRISDOL) 1.25 MG (50000 UNIT) CAPS capsule; Take 1 capsule (50,000 Units total) by mouth every 7 (seven) days.  Dispense: 4 capsule; Refill: 0  2. Type 2 diabetes mellitus associated with hypertension (Betty Cain) Good blood sugar control is important to decrease the likelihood of diabetic complications such as nephropathy, neuropathy, limb loss, blindness, coronary artery disease, and death. Intensive lifestyle modification including diet, exercise and weight loss are the first line of treatment for diabetes. Betty Cain is to follow up with her primary care provider today in  regards to hypoglycemia, and she is to check her blood sugars daily.  3. Class 1 obesity with serious comorbidity and body mass index (BMI) of 31.0 to 31.9 in adult, unspecified obesity type Betty Cain is currently in the action stage of change. As such, her goal is to continue with weight loss efforts. She has agreed to the Category 2 Plan.   Exercise goals: As is.  Behavioral modification strategies: increasing lean protein intake and decreasing simple carbohydrates.  Betty Cain has agreed to follow-up with our clinic in 2 weeks. She was informed of the importance of frequent follow-up visits to maximize her success with intensive lifestyle modifications for her multiple health conditions.   Objective:   Blood pressure 105/72, pulse 92, temperature 99 F (37.2 C), height 4\' 10"  (1.473 m), weight 149 lb (67.6 kg), SpO2 100 %. Body mass index is 31.14 kg/m.  General: Cooperative, alert, well developed, in no acute distress. HEENT: Conjunctivae and lids unremarkable. Cardiovascular: Regular rhythm.  Lungs: Normal work of breathing. Neurologic: No focal deficits.   Lab Results  Component Value Date   CREATININE 0.66 01/02/2020   BUN 10 01/02/2020   NA 139 01/02/2020   K 4.6 01/02/2020   CL 102 01/02/2020   CO2 21 05/21/2019   Lab Results  Component Value Date   ALT 14 05/21/2019   AST 13 01/02/2020   ALKPHOS 97 01/02/2020   BILITOT 0.2 01/02/2020   Lab Results  Component Value Date   HGBA1C 7.5 (A) 01/02/2020   HGBA1C 7.5 01/02/2020   HGBA1C 6.8 (A) 08/20/2019   HGBA1C 6.8 08/20/2019  HGBA1C 6.8 (A) 08/20/2019   HGBA1C 6.8 08/20/2019   Lab Results  Component Value Date   INSULIN 8.7 01/10/2020   Lab Results  Component Value Date   TSH 3.160 01/10/2020   Lab Results  Component Value Date   CHOL 154 01/10/2020   HDL 88 01/10/2020   LDLCALC 53 01/10/2020   TRIG 65 01/10/2020   CHOLHDL 3.6 08/20/2019   Lab Results  Component Value Date   WBC 7.4 01/10/2020   HGB  10.9 (L) 01/10/2020   HCT 34.4 01/10/2020   MCV 79 01/10/2020   PLT 442 01/10/2020   Lab Results  Component Value Date   IRON 53 01/20/2018   TIBC 317 01/20/2018   FERRITIN 61 01/20/2018   Attestation Statements:   Reviewed by clinician on day of visit: allergies, medications, problem list, medical history, surgical history, family history, social history, and previous encounter notes.   Wilhemena Durie, am acting as transcriptionist for [employer], PA-C.  I have reviewed the above documentation for accuracy and completeness, and I agree with the above. Abby Potash, PA-C

## 2020-06-03 NOTE — Telephone Encounter (Signed)
Using interpreter ID [id number] spoke with patient.  Blood sugar is dropping at night to about 50 and patient is feeling fatigued in the morning.  She says she takes glipizide and metformin twice a day.  She says she is eating dinner but still experiencing low glucose.  Appointment scheduled for patient to discuss with provider.

## 2020-06-04 ENCOUNTER — Telehealth: Payer: Self-pay | Admitting: Nurse Practitioner

## 2020-06-04 ENCOUNTER — Other Ambulatory Visit: Payer: Self-pay

## 2020-06-04 DIAGNOSIS — E1165 Type 2 diabetes mellitus with hyperglycemia: Secondary | ICD-10-CM

## 2020-06-04 MED ORDER — ACCU-CHEK GUIDE ME W/DEVICE KIT: 1.0000 | PACK | Freq: Two times a day (BID) | 1 refills | Status: DC

## 2020-06-04 MED ORDER — ACCU-CHEK SOFTCLIX LANCETS MISC
12 refills | Status: DC
Start: 2020-06-04 — End: 2020-06-19

## 2020-06-04 MED ORDER — ACCU-CHEK GUIDE VI STRP: ORAL_STRIP | 12 refills | Status: DC

## 2020-06-04 MED FILL — ACCU-CHEK SOFTCLIX LANCETS: 30 days supply | Qty: 100 | Fill #0

## 2020-06-04 MED FILL — ACCU-CHEK GUIDE ME W/DEVICE: W/DEVICE | 30 days supply | Qty: 1 | Fill #0

## 2020-06-04 MED FILL — ACCU-CHEK GUIDE TEST STRIP: 30 days supply | Qty: 100 | Fill #0

## 2020-06-04 NOTE — Progress Notes (Unsigned)
[facility] order to [facility] for diabetes meter supplies Accu Guide and   Soft click.

## 2020-06-12 NOTE — Telephone Encounter (Signed)
Confirmed apt with pt

## 2020-06-13 ENCOUNTER — Other Ambulatory Visit: Payer: Self-pay

## 2020-06-13 ENCOUNTER — Other Ambulatory Visit: Payer: Self-pay | Admitting: Nurse Practitioner

## 2020-06-13 ENCOUNTER — Ambulatory Visit (INDEPENDENT_AMBULATORY_CARE_PROVIDER_SITE_OTHER): Payer: 59 | Admitting: Nurse Practitioner

## 2020-06-13 ENCOUNTER — Encounter: Payer: Self-pay | Admitting: Nurse Practitioner

## 2020-06-13 VITALS — BP 124/73 | HR 84 | Temp 98.0°F | Ht <= 58 in | Wt 157.0 lb

## 2020-06-13 DIAGNOSIS — E119 Type 2 diabetes mellitus without complications: Secondary | ICD-10-CM

## 2020-06-13 DIAGNOSIS — E785 Hyperlipidemia, unspecified: Secondary | ICD-10-CM | POA: Diagnosis not present

## 2020-06-13 DIAGNOSIS — R5383 Other fatigue: Secondary | ICD-10-CM | POA: Diagnosis not present

## 2020-06-13 DIAGNOSIS — R1084 Generalized abdominal pain: Secondary | ICD-10-CM | POA: Diagnosis not present

## 2020-06-13 DIAGNOSIS — E1165 Type 2 diabetes mellitus with hyperglycemia: Secondary | ICD-10-CM

## 2020-06-13 DIAGNOSIS — I10 Essential (primary) hypertension: Secondary | ICD-10-CM

## 2020-06-13 LAB — POCT GLYCOSYLATED HEMOGLOBIN (HGB A1C): Hemoglobin A1C: 5.7 % — AB (ref 4.0–5.6)

## 2020-06-13 MED ORDER — METFORMIN HCL ER 500 MG PO TB24
500.0000 mg | ORAL_TABLET | Freq: Two times a day (BID) | ORAL | 3 refills | Status: DC
Start: 2020-06-13 — End: 2020-06-13

## 2020-06-13 MED FILL — metFORMIN HCL ER 500 MG TB2: 500 | 90 days supply | Qty: 180 | Fill #0

## 2020-06-13 NOTE — Patient Instructions (Signed)
Diabetes Mellitus and Nutrition, Adult When you have diabetes, or diabetes mellitus, it is very important to have healthy eating habits because your blood sugar (glucose) levels are greatly affected by what you eat and drink. Eating healthy foods in the right amounts, at about the same times every day, can help you:  Control your blood glucose.  Lower your risk of heart disease.  Improve your blood pressure.  Reach or maintain a healthy weight. What can affect my meal plan? Every person with diabetes is different, and each person has different needs for a meal plan. Your health care provider may recommend that you work with a dietitian to make a meal plan that is best for you. Your meal plan may vary depending on factors such as:  The calories you need.  The medicines you take.  Your weight.  Your blood glucose, blood pressure, and cholesterol levels.  Your activity level.  Other health conditions you have, such as heart or kidney disease. How do carbohydrates affect me? Carbohydrates, also called carbs, affect your blood glucose level more than any other type of food. Eating carbs naturally raises the amount of glucose in your blood. Carb counting is a method for keeping track of how many carbs you eat. Counting carbs is important to keep your blood glucose at a healthy level, especially if you use insulin or take certain oral diabetes medicines. It is important to know how many carbs you can safely have in each meal. This is different for every person. Your dietitian can help you calculate how many carbs you should have at each meal and for each snack. How does alcohol affect me? Alcohol can cause a sudden decrease in blood glucose (hypoglycemia), especially if you use insulin or take certain oral diabetes medicines. Hypoglycemia can be a life-threatening condition. Symptoms of hypoglycemia, such as sleepiness, dizziness, and confusion, are similar to symptoms of having too much  alcohol.  Do not drink alcohol if: ? Your health care provider tells you not to drink. ? You are pregnant, may be pregnant, or are planning to become pregnant.  If you drink alcohol: ? Do not drink on an empty stomach. ? Limit how much you use to:  0-1 drink a day for women.  0-2 drinks a day for men. ? Be aware of how much alcohol is in your drink. In the [place] one drink equals one 12 oz bottle of beer (355 mL), one 5 oz glass of wine (148 mL), or one 1 oz glass of hard liquor (44 mL). ? Keep yourself hydrated with water, diet soda, or unsweetened iced tea.  Keep in mind that regular soda, juice, and other mixers may contain a lot of sugar and must be counted as carbs. What are tips for following this plan? Reading food labels  Start by checking the serving size on the "Nutrition Facts" label of packaged foods and drinks. The amount of calories, carbs, fats, and other nutrients listed on the label is based on one serving of the item. Many items contain more than one serving per package.  Check the total grams (g) of carbs in one serving. You can calculate the number of servings of carbs in one serving by dividing the total carbs by 15. For example, if a food has 30 g of total carbs per serving, it would be equal to 2 servings of carbs.  Check the number of grams (g) of saturated fats and trans fats in one serving. Choose foods that have   a low amount or none of these fats.  Check the number of milligrams (mg) of salt (sodium) in one serving. Most people should limit total sodium intake to less than 2,300 mg per day.  Always check the nutrition information of foods labeled as "low-fat" or "nonfat." These foods may be higher in added sugar or refined carbs and should be avoided.  Talk to your dietitian to identify your daily goals for nutrients listed on the label. Shopping  Avoid buying canned, pre-made, or processed foods. These foods tend to be high in fat, sodium, and added  sugar.  Shop around the outside edge of the grocery store. This is where you will most often find fresh fruits and vegetables, bulk grains, fresh meats, and fresh dairy. Cooking  Use low-heat cooking methods, such as baking, instead of high-heat cooking methods like deep frying.  Cook using healthy oils, such as olive, canola, or sunflower oil.  Avoid cooking with butter, cream, or high-fat meats. Meal planning  Eat meals and snacks regularly, preferably at the same times every day. Avoid going long periods of time without eating.  Eat foods that are high in fiber, such as fresh fruits, vegetables, beans, and whole grains. Talk with your dietitian about how many servings of carbs you can eat at each meal.  Eat 4-6 oz (112-168 g) of lean protein each day, such as lean meat, chicken, fish, eggs, or tofu. One ounce (oz) of lean protein is equal to: ? 1 oz (28 g) of meat, chicken, or fish. ? 1 egg. ?  cup (62 g) of tofu.  Eat some foods each day that contain healthy fats, such as avocado, nuts, seeds, and fish.   What foods should I eat? Fruits Berries. Apples. Oranges. Peaches. Apricots. Plums. Grapes. Mango. Papaya. Pomegranate. Kiwi. Cherries. Vegetables Lettuce. Spinach. Leafy greens, including kale, chard, collard greens, and mustard greens. Beets. Cauliflower. Cabbage. Broccoli. Carrots. Green beans. Tomatoes. Peppers. Onions. Cucumbers. Brussels sprouts. Grains Whole grains, such as whole-wheat or whole-grain bread, crackers, tortillas, cereal, and pasta. Unsweetened oatmeal. Quinoa. Brown or wild rice. Meats and other proteins Seafood. Poultry without skin. Lean cuts of poultry and beef. Tofu. Nuts. Seeds. Dairy Low-fat or fat-free dairy products such as milk, yogurt, and cheese. The items listed above may not be a complete list of foods and beverages you can eat. Contact a dietitian for more information. What foods should I avoid? Fruits Fruits canned with  syrup. Vegetables Canned vegetables. Frozen vegetables with butter or cream sauce. Grains Refined white flour and flour products such as bread, pasta, snack foods, and cereals. Avoid all processed foods. Meats and other proteins Fatty cuts of meat. Poultry with skin. Breaded or fried meats. Processed meat. Avoid saturated fats. Dairy Full-fat yogurt, cheese, or milk. Beverages Sweetened drinks, such as soda or iced tea. The items listed above may not be a complete list of foods and beverages you should avoid. Contact a dietitian for more information. Questions to ask a health care provider  Do I need to meet with a diabetes educator?  Do I need to meet with a dietitian?  What number can I call if I have questions?  When are the best times to check my blood glucose? Where to find more information:  American Diabetes Association: diabetes.org  Academy of Nutrition and Dietetics: www.eatright.org  National Institute of Diabetes and Digestive and Kidney Diseases: www.niddk.nih.gov  Association of Diabetes Care and Education Specialists: www.diabeteseducator.org Summary  It is important to have healthy eating   habits because your blood sugar (glucose) levels are greatly affected by what you eat and drink.  A healthy meal plan will help you control your blood glucose and maintain a healthy lifestyle.  Your health care provider may recommend that you work with a dietitian to make a meal plan that is best for you.  Keep in mind that carbohydrates (carbs) and alcohol have immediate effects on your blood glucose levels. It is important to count carbs and to use alcohol carefully. This information is not intended to replace advice given to you by your health care provider. Make sure you discuss any questions you have with your health care provider. Document Revised: 03/06/2019 Document Reviewed: 03/06/2019 Elsevier Patient Education  2021 Elsevier Inc.  

## 2020-06-13 NOTE — Progress Notes (Signed)
[address] [place], [place]  [id number] Phone:  [phone]   Fax:  [phone]   Established Patient Office Visit  Subjective:  Patient ID: Betty Cain, female    DOB: [birth year]  Age: 45 y.o. MRN: [id number]  CC:  Chief Complaint  Patient presents with  . Diabetes    HPI Betty Cain presents for follow up. She  has a past medical history of Allergy, Anemia, Anxiety (01/2019), Asthma, B12 deficiency, Back pain, Common migraine with intractable migraine (06/28/2017), Constipation, Diabetes ([place]) (02/2019), Fatigue, GERD (gastroesophageal reflux disease), Hypertension, IBS (irritable bowel syndrome) 05/22/2003), Joint pain, Neonatal death, Palpitations, Shortness of breath, Shortness of breath on exertion, Spinal headache, Swelling of both lower extremities, Valvular heart disease, and Vitamin D deficiency (02/2019).   Diabetes Mellitus Patient presents for follow up of diabetes. Current symptoms include: hyperglycemia and hypoglycemia . Symptoms have gradually worsened. Patient denies foot ulcerations, nausea, polydipsia, polyuria and vomiting. Evaluation to date has included: fasting blood sugar, fasting lipid panel, hemoglobin A1C and microalbuminuria.  Home sugars: BGs consistently in an acceptable range, BGs have been labile ranging between 45 and 90. Current treatment: Continued sulfonylurea which has been effective and Continued metformin which has been effective.   She also has multiple other complaints per review of systems She admits that she was never given her oral contraceptive after the removal of her Nexplanon despite orders being placed Past Medical History:  Diagnosis Date  . Allergy   . Anemia   . Anxiety 01/2019  . Asthma   . B12 deficiency   . Back pain   . Common migraine with intractable migraine 06/28/2017  . Constipation   . Diabetes ([place]) 02/2019  . Fatigue   . GERD (gastroesophageal reflux disease)    pos H pylori  .  Hypertension    controlled with medication  . IBS (irritable bowel syndrome) 05-22-2003  . Joint pain   . Neonatal death    Vaginal delivery, full term-lived x2 hours.   . Palpitations   . Shortness of breath   . Shortness of breath on exertion   . Spinal headache   . Swelling of both lower extremities   . Valvular heart disease   . Vitamin D deficiency 02/2019    Past Surgical History:  Procedure Laterality Date  . ADENOIDECTOMY     and tonsils (as a child)  . boil  05/21/01   right elbow  . CESAREAN SECTION     x4  . CESAREAN SECTION  06/02/2011   Procedure: CESAREAN SECTION;  Surgeon: [name], MD;  Location: [facility];  Service: Gynecology;  Laterality: N/A;  Primary Cesarean Section Delivery Baby Boy @ 0004, Apgars 9/9  . CESAREAN SECTION N/A 12/10/2013   Procedure: REPEAT CESAREAN SECTION;  Surgeon: [name], MD;  Location: [facility];  Service: Obstetrics;  Laterality: N/A;  . CESAREAN SECTION    . colonoscopy  11/23/2018   stated had issues with sleep when in for procedure  . OTHER SURGICAL HISTORY  05-22-2003   Uterine surgery   . uterine cauterization      Family History  Problem Relation Age of Onset  . Diabetes Mother   . Diabetes Father   . Diabetes Sister   . Anesthesia problems Neg Hx   . Colon cancer Neg Hx   . Esophageal cancer Neg Hx   . Rectal cancer Neg Hx   . Stomach cancer Neg Hx   . Colon  polyps Neg Hx     Social History   Socioeconomic History  . Marital status: Married    Spouse name: [name]  . Number of children: 4  . Years of education: Not on file  . Highest education level: Not on file  Occupational History  . Occupation: Unemployed  Tobacco Use  . Smoking status: Never Smoker  . Smokeless tobacco: Never Used  Vaping Use  . Vaping Use: Never used  Substance and Sexual Activity  . Alcohol use: No  . Drug use: No  . Sexual activity: Yes    Birth control/protection: None, Implant    Comment: pregnant  Other Topics Concern  . Not  on file  Social History Narrative   Lives with husband and child   Caffeine use: daily (tea), sometimes coffee/soda   Right handed    Social Determinants of Health   Financial Resource Strain: Not on file  Food Insecurity: No Food Insecurity  . Worried About Charity fundraiser in the Last Year: Never true  . Ran Out of Food in the Last Year: Never true  Transportation Needs: Not on file  Physical Activity: Not on file  Stress: Not on file  Social Connections: Not on file  Intimate Partner Violence: Not on file    Outpatient Medications Prior to Visit  Medication Sig Dispense Refill  . Accu-Chek Softclix Lancets lancets Use 1 needle in lancets device  To check  Blood sugar 100 each 12  . acetaminophen (TYLENOL) 500 MG tablet Take 500 mg by mouth every 6 (six) hours as needed.    Marland Kitchen albuterol (VENTOLIN HFA) 108 (90 Base) MCG/ACT inhaler Inhale 2 puffs into the lungs every 6 (six) hours as needed for wheezing or shortness of breath. 8 g 2  . blood glucose meter kit and supplies Dispense based on patient and insurance preference. Use up to four times daily as directed. (FOR ICD-10 E10.9, E11.9). 1 each 0  . Blood Glucose Monitoring Suppl (ACCU-CHEK GUIDE ME) w/Device KIT 1 Device by Does not apply route 2 (two) times daily at 10 AM and 5 PM. 1 kit 1  . Blood Glucose Monitoring Suppl (ONETOUCH VERIO IQ SYSTEM) w/Device KIT 1 application by Does not apply route 4 (four) times daily. 1 kit 0  . Blood Pressure Monitoring DEVI 1 each by Does not apply route daily. 1 Device 0  . CARAFATE 1 GM/10ML suspension TAKE 10 MLS (1 G TOTAL) BY MOUTH EVERY 6 (SIX) HOURS AS NEEDED. 420 mL 2  . ferrous sulfate (FEROSUL) 325 (65 FE) MG tablet TAKE 1 TABLET (325 MG TOTAL) BY MOUTH DAILY. 90 tablet 3  . fluticasone (FLONASE) 50 MCG/ACT nasal spray Place 2 sprays into both nostrils daily. 16 g 11  . fluticasone furoate-vilanterol (BREO ELLIPTA) 100-25 MCG/INH AEPB INHALE 1 PUFF INTO THE LUNGS DAILY. 60 each 3   . Fremanezumab-vfrm (AJOVY) 225 MG/1.5ML SOAJ Inject 225 mg into the skin every 30 (thirty) days. 1.5 mL 5  . gabapentin (NEURONTIN) 300 MG capsule 1 cap am and 2 QHs 270 capsule 3  . glucose blood (ACCU-CHEK GUIDE) test strip Test blood sugar twice a day once in the morning and and once at night. Use   1 strip in meter to test gulose 100 each 12  . glucose blood test strip Use as instructed 100 each 0  . hydrocortisone (ANUSOL-HC) 2.5 % rectal cream Place 1 application rectally 2 (two) times daily. 30 g 1  . meloxicam (MOBIC) 15 MG  tablet Take 1 tablet (15 mg total) by mouth daily. 30 tablet 11  . methylcellulose (CITRUCEL) oral powder Take as directed, daily    . metoprolol succinate (TOPROL XL) 100 MG 24 hr tablet Take 1 tablet (100 mg total) by mouth daily. Take with or immediately following a meal. 90 tablet 3  . Multiple Vitamin (MULTI VITAMIN DAILY PO) Take by mouth.    . Multiple Vitamins-Minerals (ALIVE WOMENS ENERGY) TABS Take 1 tablet by mouth daily.    . norgestimate-ethinyl estradiol (SPRINTEC 28) 0.25-35 MG-MCG tablet Take 1 tablet by mouth daily. 30 tablet 11  . nortriptyline (PAMELOR) 25 MG capsule Take 1 capsule (25 mg total) by mouth at bedtime. 90 capsule 3  . ondansetron (ZOFRAN-ODT) 8 MG disintegrating tablet TAKE 1 TABLET BY MOUTH EVERY 8 HOURS AS NEEDED FOR NAUSEA OR VOMITING. 30 tablet 0  . OneTouch Delica Lancets 02D MISC 1 application by Does not apply route in the morning, at noon, in the evening, and at bedtime. 100 each 11  . pantoprazole (PROTONIX) 40 MG tablet TAKE 1 TABLET BY MOUTH 2 TIMES DAILY BEFORE A MEAL. 180 tablet 1  . potassium chloride 20 MEQ/15ML (10%) SOLN TAKE 15 MLS (20 MEQ TOTAL) BY MOUTH DAILY. 473 mL 2  . Rimegepant Sulfate (NURTEC) 75 MG TBDP Take 1 tablet at the onset of migraine. 30 tablet 1  . rizatriptan (MAXALT) 10 MG tablet Take 1 tablet (10 mg total) by mouth 3 (three) times daily as needed for migraine. May repeat in 2 hours if needed 10  tablet 3  . vitamin B-12 (CYANOCOBALAMIN) 1000 MCG tablet Take 5 tablets (5,000 mcg total) by mouth daily. 450 tablet 3  . Vitamin D, Ergocalciferol, (DRISDOL) 1.25 MG (50000 UNIT) CAPS capsule Take 1 capsule (50,000 Units total) by mouth every 7 (seven) days. 4 capsule 0  . glipiZIDE (GLUCOTROL) 10 MG tablet Take 1 tablet (10 mg total) by mouth 2 (two) times daily before a meal. 180 tablet 3  . metFORMIN (GLUCOPHAGE-XR) 500 MG 24 hr tablet Take 2 tablets (1,000 mg total) by mouth daily with breakfast. 60 tablet 0  . Blood Glucose Monitoring Suppl (ACCU-CHEK GUIDE ME) w/Device KIT USE AS DIRECTED IN THE MORNING AT 10AM AND EVENING AT 5PM.    . levocetirizine (XYZAL) 5 MG tablet Take 1 tablet (5 mg total) by mouth every evening. 90 tablet 3  . rosuvastatin (CRESTOR) 5 MG tablet Take 1 tablet (5 mg total) by mouth at bedtime. 30 tablet 11   No facility-administered medications prior to visit.    Allergies  Allergen Reactions  . Topamax [Topiramate] Itching and Rash    ROS Review of Systems  Constitutional: Positive for appetite change (decreased appetite 10-15 days), chills, fatigue and fever (98).       Feels hot  Cardiovascular: Positive for palpitations.  Gastrointestinal: Positive for abdominal distention and abdominal pain.       Bloating 3-4 times and goes away Once a day now once every four days.   Using Vitamin not effective  Endocrine: Positive for heat intolerance.  Neurological: Positive for dizziness.       Sycnopy       Objective:    Physical Exam Constitutional:      General: She is not in acute distress.    Appearance: She is obese. She is not ill-appearing, toxic-appearing or diaphoretic.  HENT:     Head: Normocephalic and atraumatic.  Cardiovascular:     Rate and Rhythm: Normal rate and  regular rhythm.     Pulses: Normal pulses.     Heart sounds: Normal heart sounds.  Pulmonary:     Effort: Pulmonary effort is normal.     Breath sounds: Normal breath  sounds.  Abdominal:     Palpations: Abdomen is soft.     Tenderness: There is abdominal tenderness.     Comments: Increased abdominal girth  Musculoskeletal:        General: Normal range of motion.     Cervical back: Normal range of motion.  Skin:    General: Skin is warm and dry.     Capillary Refill: Capillary refill takes less than 2 seconds.  Neurological:     General: No focal deficit present.     Mental Status: She is alert and oriented to person, place, and time.  Psychiatric:        Mood and Affect: Mood normal.        Behavior: Behavior normal.     BP 124/73   Pulse 84   Temp 98 F (36.7 C) (Temporal)   Ht 4' 10" (1.473 m)   Wt 157 lb (71.2 kg)   SpO2 99%   BMI 32.81 kg/m  Wt Readings from Last 3 Encounters:  06/13/20 157 lb (71.2 kg)  05/29/20 149 lb (67.6 kg)  05/14/20 149 lb (67.6 kg)     Health Maintenance Due  Topic Date Due  . Hepatitis C Screening  Never done  . PNEUMOCOCCAL POLYSACCHARIDE VACCINE AGE 96-64 HIGH RISK  Never done  . OPHTHALMOLOGY EXAM  Never done  . PAP SMEAR-Modifier  06/19/2016  . COVID-19 Vaccine (3 - Booster for Pfizer series) 01/27/2020    There are no preventive care reminders to display for this patient.  Lab Results  Component Value Date   TSH 2.040 06/13/2020   Lab Results  Component Value Date   WBC 7.7 06/13/2020   HGB 10.8 (L) 06/13/2020   HCT 33.0 (L) 06/13/2020   MCV 81 06/13/2020   PLT 356 06/13/2020   Lab Results  Component Value Date   NA 142 06/13/2020   K 4.3 06/13/2020   CO2 21 05/21/2019   GLUCOSE 76 06/13/2020   BUN 14 06/13/2020   CREATININE 0.55 (L) 06/13/2020   BILITOT 0.2 06/13/2020   ALKPHOS 91 06/13/2020   AST 14 06/13/2020   ALT 14 05/21/2019   PROT 7.5 06/13/2020   ALBUMIN 4.5 06/13/2020   CALCIUM 9.6 06/13/2020   ANIONGAP 11 08/30/2018   GFR 119.42 08/28/2018   Lab Results  Component Value Date   CHOL 122 06/13/2020   Lab Results  Component Value Date   HDL 36 (L)  06/13/2020   Lab Results  Component Value Date   LDLCALC 63 06/13/2020   Lab Results  Component Value Date   TRIG 127 06/13/2020   Lab Results  Component Value Date   CHOLHDL 3.4 06/13/2020   Lab Results  Component Value Date   HGBA1C 7.5 (A) 01/02/2020   HGBA1C 7.5 01/02/2020      Assessment & Plan:   Problem List Items Addressed This Visit      Endocrine   Type 2 diabetes mellitus with hyperglycemia, without long-term current use of insulin (HCC) - Primary Persistent hypoglycemia discontinued glipizide 10 mg and decrease Metformin 1000 mg to 500 mg twice daily patient to follow-up sooner if CBG is greater than 150 consistently   Relevant Medications   metFORMIN (GLUCOPHAGE-XR) 500 MG 24 hr tablet   Other Relevant  Orders   HgB A1c   POCT Urinalysis Dipstick     Other   Dyslipidemia (high LDL; low HDL)   Relevant Orders   Lipid panel (Completed)   Abdominal pain Encourage patient to follow-up with gastroenterologist for further evaluation    Other Visit Diagnoses    Diabetes mellitus with coincident hypertension ([place])       Relevant Medications   metFORMIN (GLUCOPHAGE-XR) 500 MG 24 hr tablet   Other Relevant Orders   Microalbumin, urine (Completed)   Fatigue, unspecified type     Labs pending for further evaluation   Relevant Orders   TSH (Completed)   CBC with Differential/Platelet (Completed)   Comp. Metabolic Panel (12) (Completed)   Vitamin B12 (Completed)      Meds ordered this encounter  Medications  . metFORMIN (GLUCOPHAGE-XR) 500 MG 24 hr tablet    Sig: Take 1 tablet (500 mg total) by mouth 2 (two) times daily.    Dispense:  180 tablet    Refill:  3    Order Specific Question:   Supervising Provider    Answer:   Tresa Garter [id number]    Follow-up: Return in about 4 weeks (around 07/11/2020) for follow up DM [id number].    Vevelyn Francois, NP

## 2020-06-14 LAB — COMP. METABOLIC PANEL (12)
AST: 14 IU/L (ref 0–40)
Albumin/Globulin Ratio: 1.5 (ref 1.2–2.2)
Albumin: 4.5 g/dL (ref 3.8–4.8)
Alkaline Phosphatase: 91 IU/L (ref 44–121)
BUN/Creatinine Ratio: 25 — ABNORMAL HIGH (ref 9–23)
BUN: 14 mg/dL (ref 6–24)
Bilirubin Total: 0.2 mg/dL (ref 0.0–1.2)
Calcium: 9.6 mg/dL (ref 8.7–10.2)
Chloride: 102 mmol/L (ref 96–106)
Creatinine, Ser: 0.55 mg/dL — ABNORMAL LOW (ref 0.57–1.00)
Globulin, Total: 3 g/dL (ref 1.5–4.5)
Glucose: 76 mg/dL (ref 65–99)
Potassium: 4.3 mmol/L (ref 3.5–5.2)
Sodium: 142 mmol/L (ref 134–144)
Total Protein: 7.5 g/dL (ref 6.0–8.5)
eGFR: 117 mL/min/{1.73_m2} (ref >59)

## 2020-06-14 LAB — CBC WITH DIFFERENTIAL/PLATELET
Basophils Absolute: 0.1 10*3/uL (ref 0.0–0.2)
Basos: 1 %
EOS (ABSOLUTE): 0.1 10*3/uL (ref 0.0–0.4)
Eos: 1 %
Hematocrit: 33 % — ABNORMAL LOW (ref 34.0–46.6)
Hemoglobin: 10.8 g/dL — ABNORMAL LOW (ref 11.1–15.9)
Immature Grans (Abs): 0 10*3/uL (ref 0.0–0.1)
Immature Granulocytes: 0 %
Lymphocytes Absolute: 3.6 10*3/uL — ABNORMAL HIGH (ref 0.7–3.1)
Lymphs: 47 %
MCH: 26.5 pg — ABNORMAL LOW (ref 26.6–33.0)
MCHC: 32.7 g/dL (ref 31.5–35.7)
MCV: 81 fL (ref 79–97)
Monocytes Absolute: 0.4 10*3/uL (ref 0.1–0.9)
Monocytes: 5 %
Neutrophils Absolute: 3.5 10*3/uL (ref 1.4–7.0)
Neutrophils: 46 %
Platelets: 356 10*3/uL (ref 150–450)
RBC: 4.07 x10E6/uL (ref 3.77–5.28)
RDW: 14.2 % (ref 11.7–15.4)
WBC: 7.7 10*3/uL (ref 3.4–10.8)

## 2020-06-14 LAB — VITAMIN B12: Vitamin B-12: 635 pg/mL (ref 232–1245)

## 2020-06-14 LAB — MICROALBUMIN, URINE: Microalbumin, Urine: 11.5 ug/mL

## 2020-06-14 LAB — LIPID PANEL
Chol/HDL Ratio: 3.4 ratio (ref 0.0–4.4)
Cholesterol, Total: 122 mg/dL (ref 100–199)
HDL: 36 mg/dL — ABNORMAL LOW (ref >39)
LDL Chol Calc (NIH): 63 mg/dL (ref 0–99)
Triglycerides: 127 mg/dL (ref 0–149)
VLDL Cholesterol Cal: 23 mg/dL (ref 5–40)

## 2020-06-14 LAB — TSH: TSH: 2.04 u[IU]/mL (ref 0.450–4.500)

## 2020-06-17 ENCOUNTER — Other Ambulatory Visit (INDEPENDENT_AMBULATORY_CARE_PROVIDER_SITE_OTHER): Payer: Self-pay | Admitting: Physician Assistant

## 2020-06-17 ENCOUNTER — Ambulatory Visit (INDEPENDENT_AMBULATORY_CARE_PROVIDER_SITE_OTHER): Payer: 59 | Admitting: Physician Assistant

## 2020-06-17 ENCOUNTER — Encounter (INDEPENDENT_AMBULATORY_CARE_PROVIDER_SITE_OTHER): Payer: Self-pay | Admitting: Physician Assistant

## 2020-06-17 ENCOUNTER — Other Ambulatory Visit: Payer: Self-pay

## 2020-06-17 VITALS — BP 112/75 | HR 86 | Temp 97.9°F | Ht <= 58 in | Wt 150.0 lb

## 2020-06-17 DIAGNOSIS — I152 Hypertension secondary to endocrine disorders: Secondary | ICD-10-CM

## 2020-06-17 DIAGNOSIS — E669 Obesity, unspecified: Secondary | ICD-10-CM | POA: Diagnosis not present

## 2020-06-17 DIAGNOSIS — E1159 Type 2 diabetes mellitus with other circulatory complications: Secondary | ICD-10-CM

## 2020-06-17 DIAGNOSIS — E559 Vitamin D deficiency, unspecified: Secondary | ICD-10-CM | POA: Diagnosis not present

## 2020-06-17 DIAGNOSIS — Z6830 Body mass index (BMI) 30.0-30.9, adult: Secondary | ICD-10-CM

## 2020-06-17 DIAGNOSIS — Z9189 Other specified personal risk factors, not elsewhere classified: Secondary | ICD-10-CM | POA: Diagnosis not present

## 2020-06-17 MED ORDER — VITAMIN D (ERGOCALCIFEROL) 1.25 MG (50000 UNIT) PO CAPS
50000.0000 [IU] | ORAL_CAPSULE | ORAL | 0 refills | Status: DC
Start: 2020-06-17 — End: 2020-06-17

## 2020-06-17 MED FILL — VIT D2 1.25 MG (50,000 UNIT: 1.25 MG | 28 days supply | Qty: 4 | Fill #0

## 2020-06-18 NOTE — Progress Notes (Signed)
PATIENT: Betty Cain DOB: 15-Apr-1975  REASON FOR VISIT: follow up HISTORY FROM: patient  HISTORY OF PRESENT ILLNESS: Today 06/19/20 Betty Cain is a 45 year old female with history of daily headaches.  Has tried Aimovig, is now on Ajovy since January. Has DM, A1C was 5.7.  Currently taking gabapentin, metoprolol, nortriptyline.  Has an allergy to Topamax. Likes Ajovy, helping more than Aimovig, migraines are improved, only 1 a month, but is more intense. It may last 5 days. Is able to continue with function, has 4 kids. Takes Nurtec, is helpful. May have nausea, but no vomiting. Is happy with how things have improved. With headaches, can have aches all over, tightness in her neck. Here with interpreter.   Update 12/20/2019 SS: Betty Cain is a 45 year old female with history of daily headaches.  When last seen, she was switched from [place] to [place], try Nurtec for acute headache.  Ajovy has yet to be approved, may have been delayed as she has been out of the country for 2 months in [place].  Took Nurtec with good benefit.  Had no injection of Aimovig for the last month, having daily migraine.  Headache is usually located left occipitally, will radiate forward right and left side.  She is sensitive to light and sound.  She may have nausea or vomiting.  Presents today for evaluation accompanied by interpreter.  She is still taking gabapentin, Toprol-XL, and nortriptyline.  HISTORY 09/17/2019 MM: Betty Cain is a 45 year old female with a history of daily headaches.  She returns today for follow-up.  She reports that her headaches have gotten worse.  Reports that she only has 10 headache free days a month.  Reports that her headaches can sometimes last 3 or 4 days.  Reports that Maxalt is no longer offering her any benefit.  Reports that her headaches usually start in the back of the head on the left side and radiates to the frontal region.  She denies photophobia and phonophobia.  But does report nausea.   She has remained on Aimovig, nortriptyline, gabapentin and Toprol   REVIEW OF SYSTEMS: Out of a complete 14 system review of symptoms, the patient complains only of the following symptoms, and all other reviewed systems are negative.  Headache  ALLERGIES: Allergies  Allergen Reactions  . Topamax [Topiramate] Itching and Rash    HOME MEDICATIONS: Outpatient Medications Prior to Visit  Medication Sig Dispense Refill  . Accu-Chek Softclix Lancets lancets Use 1 needle in lancets device  To check  Blood sugar 100 each 12  . acetaminophen (TYLENOL) 500 MG tablet Take 500 mg by mouth every 6 (six) hours as needed.    Marland Kitchen albuterol (VENTOLIN HFA) 108 (90 Base) MCG/ACT inhaler Inhale 2 puffs into the lungs every 6 (six) hours as needed for wheezing or shortness of breath. 8 g 2  . blood glucose meter kit and supplies Dispense based on patient and insurance preference. Use up to four times daily as directed. (FOR ICD-10 E10.9, E11.9). 1 each 0  . Blood Glucose Monitoring Suppl (ACCU-CHEK GUIDE ME) w/Device KIT 1 Device by Does not apply route 2 (two) times daily at 10 AM and 5 PM. 1 kit 1  . Blood Glucose Monitoring Suppl (ACCU-CHEK GUIDE ME) w/Device KIT USE AS DIRECTED IN THE MORNING AT 10AM AND EVENING AT 5PM.    . Blood Glucose Monitoring Suppl (ONETOUCH VERIO IQ SYSTEM) w/Device KIT 1 application by Does not apply route 4 (four) times daily. 1 kit 0  .  Blood Pressure Monitoring DEVI 1 each by Does not apply route daily. 1 Device 0  . CARAFATE 1 GM/10ML suspension TAKE 10 MLS (1 G TOTAL) BY MOUTH EVERY 6 (SIX) HOURS AS NEEDED. 420 mL 2  . ferrous sulfate (FEROSUL) 325 (65 FE) MG tablet TAKE 1 TABLET (325 MG TOTAL) BY MOUTH DAILY. 90 tablet 3  . fluticasone (FLONASE) 50 MCG/ACT nasal spray Place 2 sprays into both nostrils daily. 16 g 11  . fluticasone furoate-vilanterol (BREO ELLIPTA) 100-25 MCG/INH AEPB INHALE 1 PUFF INTO THE LUNGS DAILY. 60 each 3  . Fremanezumab-vfrm (AJOVY) 225 MG/1.5ML SOAJ  Inject 225 mg into the skin every 30 (thirty) days. 1.5 mL 5  . gabapentin (NEURONTIN) 300 MG capsule 1 cap am and 2 QHs 270 capsule 3  . glucose blood (ACCU-CHEK GUIDE) test strip Test blood sugar twice a day once in the morning and and once at night. Use   1 strip in meter to test gulose 100 each 12  . glucose blood test strip Use as instructed 100 each 0  . hydrocortisone (ANUSOL-HC) 2.5 % rectal cream Place 1 application rectally 2 (two) times daily. 30 g 1  . levocetirizine (XYZAL) 5 MG tablet Take 1 tablet (5 mg total) by mouth every evening. 90 tablet 3  . meloxicam (MOBIC) 15 MG tablet Take 1 tablet (15 mg total) by mouth daily. 30 tablet 11  . metFORMIN (GLUCOPHAGE-XR) 500 MG 24 hr tablet Take 1 tablet (500 mg total) by mouth 2 (two) times daily. 180 tablet 3  . methylcellulose (CITRUCEL) oral powder Take as directed, daily    . metoprolol succinate (TOPROL XL) 100 MG 24 hr tablet Take 1 tablet (100 mg total) by mouth daily. Take with or immediately following a meal. 90 tablet 3  . Multiple Vitamin (MULTI VITAMIN DAILY PO) Take by mouth.    . Multiple Vitamins-Minerals (ALIVE WOMENS ENERGY) TABS Take 1 tablet by mouth daily.    . norgestimate-ethinyl estradiol (SPRINTEC 28) 0.25-35 MG-MCG tablet Take 1 tablet by mouth daily. 30 tablet 11  . nortriptyline (PAMELOR) 25 MG capsule Take 1 capsule (25 mg total) by mouth at bedtime. 90 capsule 3  . ondansetron (ZOFRAN-ODT) 8 MG disintegrating tablet TAKE 1 TABLET BY MOUTH EVERY 8 HOURS AS NEEDED FOR NAUSEA OR VOMITING. 30 tablet 0  . OneTouch Delica Lancets 62U MISC 1 application by Does not apply route in the morning, at noon, in the evening, and at bedtime. 100 each 11  . pantoprazole (PROTONIX) 40 MG tablet TAKE 1 TABLET BY MOUTH 2 TIMES DAILY BEFORE A MEAL. 180 tablet 1  . potassium chloride 20 MEQ/15ML (10%) SOLN TAKE 15 MLS (20 MEQ TOTAL) BY MOUTH DAILY. 473 mL 2  . Rimegepant Sulfate (NURTEC) 75 MG TBDP Take 1 tablet at the onset of  migraine. 30 tablet 1  . rizatriptan (MAXALT) 10 MG tablet Take 1 tablet (10 mg total) by mouth 3 (three) times daily as needed for migraine. May repeat in 2 hours if needed 10 tablet 3  . rosuvastatin (CRESTOR) 5 MG tablet Take 1 tablet (5 mg total) by mouth at bedtime. 30 tablet 11  . vitamin B-12 (CYANOCOBALAMIN) 1000 MCG tablet Take 5 tablets (5,000 mcg total) by mouth daily. 450 tablet 3  . Vitamin D, Ergocalciferol, (DRISDOL) 1.25 MG (50000 UNIT) CAPS capsule Take 1 capsule (50,000 Units total) by mouth every 7 (seven) days. 4 capsule 0   No facility-administered medications prior to visit.    PAST MEDICAL  HISTORY: Past Medical History:  Diagnosis Date  . Allergy   . Anemia   . Anxiety 01/2019  . Asthma   . B12 deficiency   . Back pain   . Common migraine with intractable migraine 06/28/2017  . Constipation   . Diabetes ([place]) 02/2019  . Fatigue   . GERD (gastroesophageal reflux disease)    pos H pylori  . Hypertension    controlled with medication  . IBS (irritable bowel syndrome) 2005  . Joint pain   . Neonatal death    Vaginal delivery, full term-lived x2 hours.   . Palpitations   . Shortness of breath   . Shortness of breath on exertion   . Spinal headache   . Swelling of both lower extremities   . Valvular heart disease   . Vitamin D deficiency 02/2019    PAST SURGICAL HISTORY: Past Surgical History:  Procedure Laterality Date  . ADENOIDECTOMY     and tonsils (as a child)  . boil  2003   right elbow  . CESAREAN SECTION     x4  . CESAREAN SECTION  06/02/2011   Procedure: CESAREAN SECTION;  Surgeon: [name], MD;  Location: [facility];  Service: Gynecology;  Laterality: N/A;  Primary Cesarean Section Delivery Baby Boy @ 0004, Apgars 9/9  . CESAREAN SECTION N/A 12/10/2013   Procedure: REPEAT CESAREAN SECTION;  Surgeon: [name], MD;  Location: [facility];  Service: Obstetrics;  Laterality: N/A;  . CESAREAN SECTION    . colonoscopy  11/23/2018   stated  had issues with sleep when in for procedure  . OTHER SURGICAL HISTORY  2005   Uterine surgery   . uterine cauterization      FAMILY HISTORY: Family History  Problem Relation Age of Onset  . Diabetes Mother   . Diabetes Father   . Diabetes Sister   . Anesthesia problems Neg Hx   . Colon cancer Neg Hx   . Esophageal cancer Neg Hx   . Rectal cancer Neg Hx   . Stomach cancer Neg Hx   . Colon polyps Neg Hx     SOCIAL HISTORY: Social History   Socioeconomic History  . Marital status: Married    Spouse name: Betty Cain  . Number of children: 4  . Years of education: Not on file  . Highest education level: Not on file  Occupational History  . Occupation: Unemployed  Tobacco Use  . Smoking status: Never Smoker  . Smokeless tobacco: Never Used  Vaping Use  . Vaping Use: Never used  Substance and Sexual Activity  . Alcohol use: No  . Drug use: No  . Sexual activity: Yes    Birth control/protection: None, Implant    Comment: pregnant  Other Topics Concern  . Not on file  Social History Narrative   Lives with husband and child   Caffeine use: daily (tea), sometimes coffee/soda   Right handed    Social Determinants of Health   Financial Resource Strain: Not on file  Food Insecurity: No Food Insecurity  . Worried About Charity fundraiser in the Last Year: Never true  . Ran Out of Food in the Last Year: Never true  Transportation Needs: Not on file  Physical Activity: Not on file  Stress: Not on file  Social Connections: Not on file  Intimate Partner Violence: Not on file   PHYSICAL EXAM  Vitals:   06/19/20 1011  BP: 112/73  Pulse: 70  Weight: 157 lb (71.2  kg)  Height: 4' 10"  (1.473 m)   Body mass index is 32.81 kg/m.  Generalized: Well developed, in no acute distress   Neurological examination  Mentation: Alert oriented to time, place, history taking. Follows all commands speech and language fluent via interpreter Cranial nerve II-XII: Pupils were equal  round reactive to light. Extraocular movements were full, visual field were full on confrontational test. Facial sensation and strength were normal.  Head turning and shoulder shrug  were normal and symmetric. Motor: The motor testing reveals 5 over 5 strength of all 4 extremities. Good symmetric motor tone is noted throughout.  Sensory: Sensory testing is intact to soft touch on all 4 extremities. No evidence of extinction is noted.  Coordination: Cerebellar testing reveals good finger-nose-finger and heel-to-shin bilaterally.  Gait and station: Gait is normal.  Reflexes: Deep tendon reflexes are symmetric and normal bilaterally.   DIAGNOSTIC DATA (LABS, IMAGING, TESTING) - I reviewed patient records, labs, notes, testing and imaging myself where available.  Lab Results  Component Value Date   WBC 7.7 06/13/2020   HGB 10.8 (L) 06/13/2020   HCT 33.0 (L) 06/13/2020   MCV 81 06/13/2020   PLT 356 06/13/2020      Component Value Date/Time   NA 142 06/13/2020 1127   K 4.3 06/13/2020 1127   CL 102 06/13/2020 1127   CO2 21 05/21/2019 0920   GLUCOSE 76 06/13/2020 1127   GLUCOSE 185 (H) 08/30/2018 1240   GLUCOSE 77 09/21/2013 1230   BUN 14 06/13/2020 1127   CREATININE 0.55 (L) 06/13/2020 1127   CREATININE 0.58 09/14/2016 0939   CALCIUM 9.6 06/13/2020 1127   PROT 7.5 06/13/2020 1127   ALBUMIN 4.5 06/13/2020 1127   AST 14 06/13/2020 1127   ALT 14 05/21/2019 0920   ALKPHOS 91 06/13/2020 1127   BILITOT 0.2 06/13/2020 1127   GFRNONAA 109 01/02/2020 1027   GFRNONAA >89 09/14/2016 0939   GFRAA 126 01/02/2020 1027   GFRAA >89 09/14/2016 0939   Lab Results  Component Value Date   CHOL 122 06/13/2020   HDL 36 (L) 06/13/2020   LDLCALC 63 06/13/2020   TRIG 127 06/13/2020   CHOLHDL 3.4 06/13/2020   Lab Results  Component Value Date   HGBA1C 5.7 (A) 06/13/2020   Lab Results  Component Value Date   VITAMINB12 635 06/13/2020   Lab Results  Component Value Date   TSH 2.040  06/13/2020   ASSESSMENT AND PLAN 45 y.o. year old female  has a past medical history of Allergy, Anemia, Anxiety (01/2019), Asthma, B12 deficiency, Back pain, Common migraine with intractable migraine (06/28/2017), Constipation, Diabetes ([place]) (02/2019), Fatigue, GERD (gastroesophageal reflux disease), Hypertension, IBS (irritable bowel syndrome) June 20, 2003), Joint pain, Neonatal death, Palpitations, Shortness of breath, Shortness of breath on exertion, Spinal headache, Swelling of both lower extremities, Valvular heart disease, and Vitamin D deficiency (02/2019). here with:  1.  Chronic migraine headache  -Improved with Ajovy, 1 migraine a month -Continue Ajovy 225 mg monthly injection for migraine prevention -Continue Nurtec as needed for acute relief -Add on tizanidine 2 mg as needed for acute migraine treatment to help with reported neck tension, body aches with migraines -Also taking nortriptyline, gabapentin, and Toprol XL  -Follow-up in 6 months or sooner if needed, she didn't want to go 1 year for follow-up, Jinny Blossom has seen before  I spent 20 minutes of face-to-face and non-face-to-face time with patient.  This included previsit chart review, lab review, study review, order entry, electronic health record documentation,  patient education.  Butler Denmark, AGNP-C, DNP 06/19/2020, 10:29 AM [facility] [address], [place], [place] [address] [phone]

## 2020-06-18 NOTE — Progress Notes (Signed)
Chief Complaint:   OBESITY Betty Cain is here to discuss her progress with her obesity treatment plan along with follow-up of her obesity related diagnoses. Betty Cain is on the Category 2 Plan and states she is following her eating plan approximately 100% of the time. Betty Cain states she is on the treadmill for 5 minutes 7 times per week.  Today's visit was #: 8 Starting weight: 152 lbs Starting date: 01/10/2020 Today's weight: 150 lbs Today's date: 06/17/2020 Total lbs lost to date: 2 Total lbs lost since last in-office visit: 0  Interim History: Betty Cain is eating eggs with toast for breakfast. She eats microwave meals but she doesn't like them. She is skipping dinner most nights. She is asking about intermittent fasting.  Subjective:   1. 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 Her BGs averages between 84 and 125. She is on metformin. I discussed labs with the patient today.  2. Vitamin D deficiency Betty Cain is on Vit D weekly, and she denies nausea, vomiting, or muscle weakness. Last bit D level was not at goal.  3. At risk for impaired metabolic function Betty Cain is at increased risk for impaired metabolic function due to skipping dinner.  Assessment/Plan:   1. 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 Intensive lifestyle modification including diet, exercise and weight loss are the first line of treatment for diabetes. Betty Cain will continue to follow up with her primary care provider, and will continue her medications.  2. Vitamin D deficiency Low Vitamin D level contributes to fatigue and are associated with obesity, breast, and colon cancer. We will refill prescription Vitamin D for 1 month. Betty Cain will  follow-up for routine testing of Vitamin D, at least 2-3 times per year to avoid over-replacement.  - Vitamin D, Ergocalciferol, (DRISDOL) 1.25 MG (50000 UNIT) CAPS capsule; Take 1 capsule (50,000 Units total) by mouth every 7 (seven) days.  Dispense: 4 capsule; Refill: 0  3. At risk for impaired metabolic function Betty Cain was given approximately 15 minutes of impaired  metabolic function prevention counseling today. We discussed intensive lifestyle modifications today with an emphasis on specific nutrition and exercise instructions and strategies.   Repetitive spaced learning was employed today to elicit superior memory formation and behavioral change.  4. Class 1 obesity with serious comorbidity and body mass index (BMI) of 30.0 to 30.9 in adult, unspecified obesity type Betty Cain is currently in the action stage of change. As such, her goal is to continue with weight loss efforts. She has agreed to the Category 2 Plan.   Exercise goals: As is.  Behavioral modification strategies: no skipping meals and meal planning and cooking strategies.  Betty Cain has agreed to follow-up with our clinic in 3 weeks. She was informed of the importance of frequent follow-up visits to maximize her success with intensive lifestyle modifications for her multiple health conditions.   Objective:   Blood pressure 112/75, pulse 86, temperature 97.9 F (36.6 C), height 4\' 10"  (1.473 m), weight 150 lb (68 kg), SpO2 100 %. Body mass index is 31.35 kg/m.  General: Cooperative, alert, well developed, in no acute distress. HEENT: Conjunctivae and lids unremarkable. Cardiovascular: Regular rhythm.  Lungs: Normal work of breathing. Neurologic: No focal deficits.   Lab Results  Component Value Date   CREATININE  0.55 (L) 06/13/2020   BUN 14 06/13/2020   NA 142 06/13/2020   K 4.3 06/13/2020   CL 102 06/13/2020   CO2 21 05/21/2019   Lab Results  Component Value Date   ALT 14 05/21/2019   AST 14 06/13/2020   ALKPHOS 91  06/13/2020   BILITOT 0.2 06/13/2020   Lab Results  Component Value Date   HGBA1C 5.7 (A) 06/13/2020   HGBA1C 7.5 (A) 01/02/2020   HGBA1C 7.5 01/02/2020   HGBA1C 6.8 (A) 08/20/2019   HGBA1C 6.8 08/20/2019   HGBA1C 6.8 (A) 08/20/2019   HGBA1C 6.8 08/20/2019   Lab Results  Component Value Date   INSULIN 8.7 01/10/2020   Lab Results  Component Value Date   TSH 2.040 06/13/2020   Lab Results  Component Value Date   CHOL 122 06/13/2020   HDL 36 (L) 06/13/2020   LDLCALC 63 06/13/2020   TRIG 127 06/13/2020   CHOLHDL 3.4 06/13/2020   Lab Results  Component Value Date   WBC 7.7 06/13/2020   HGB 10.8 (L) 06/13/2020   HCT 33.0 (L) 06/13/2020   MCV 81 06/13/2020   PLT 356 06/13/2020   Lab Results  Component Value Date   IRON 53 01/20/2018   TIBC 317 01/20/2018   FERRITIN 61 01/20/2018   Attestation Statements:   Reviewed by clinician on day of visit: allergies, medications, problem list, medical history, surgical history, family history, social history, and previous encounter notes.   Wilhemena Durie, am acting as transcriptionist for [employer], PA-C.  I have reviewed the above documentation for accuracy and completeness, and I agree with the above. Abby Potash, PA-C

## 2020-06-19 ENCOUNTER — Encounter: Payer: Self-pay | Admitting: Neurology

## 2020-06-19 ENCOUNTER — Telehealth: Payer: Self-pay | Admitting: Emergency Medicine

## 2020-06-19 ENCOUNTER — Ambulatory Visit (INDEPENDENT_AMBULATORY_CARE_PROVIDER_SITE_OTHER): Payer: 59 | Admitting: Neurology

## 2020-06-19 ENCOUNTER — Other Ambulatory Visit: Payer: Self-pay | Admitting: Neurology

## 2020-06-19 VITALS — BP 112/73 | HR 70 | Ht <= 58 in | Wt 157.0 lb

## 2020-06-19 DIAGNOSIS — G43019 Migraine without aura, intractable, without status migrainosus: Secondary | ICD-10-CM | POA: Diagnosis not present

## 2020-06-19 MED ORDER — TIZANIDINE HCL 2 MG PO TABS: 2.0000 mg | ORAL_TABLET | Freq: Three times a day (TID) | ORAL | 1 refills | Status: DC | PRN

## 2020-06-19 MED ORDER — NURTEC 75 MG PO TBDP: ORAL_TABLET | ORAL | 1 refills | Status: DC

## 2020-06-19 MED ORDER — AJOVY 225 MG/1.5ML ~~LOC~~ SOAJ: 225.0000 mg | SUBCUTANEOUS | 11 refills | Status: DC

## 2020-06-19 MED FILL — MONO-LINYAH 28 TABLET: 0.25-35 | 28 days supply | Qty: 28 | Fill #0

## 2020-06-19 MED FILL — tiZANidine HCL 2 MG TABS: 2 | 6 days supply | Qty: 20 | Fill #0

## 2020-06-19 NOTE — Patient Instructions (Addendum)
Add in tizanidine as needed for acute headache to help with muscle tension  Continue the Ajovy for migraine prevention  Keep Nurtec for acute headache treatment  See back in 6 months

## 2020-06-19 NOTE — Telephone Encounter (Signed)
PA started on CMM for Nurtec. Key: [id number] Awaiting determination from [place]

## 2020-06-22 NOTE — Progress Notes (Signed)
I have read the note, and I agree with the clinical assessment and plan.  Jhane Lorio K Shellsea Borunda   

## 2020-06-23 NOTE — Telephone Encounter (Signed)
Denied.   Patient must try/fail 2 preferred meds: rizatriptan ODT, rizatriptan tablet, sumatriptan nasal spray/tablet/vial.   PA# [id number] [employer]: [phone]

## 2020-06-28 ENCOUNTER — Encounter: Payer: Self-pay | Admitting: Neurology

## 2020-06-28 ENCOUNTER — Encounter (INDEPENDENT_AMBULATORY_CARE_PROVIDER_SITE_OTHER): Payer: Self-pay | Admitting: Physician Assistant

## 2020-06-30 ENCOUNTER — Telehealth: Payer: Self-pay | Admitting: *Deleted

## 2020-06-30 NOTE — Telephone Encounter (Signed)
Please review and advise.

## 2020-06-30 NOTE — Telephone Encounter (Signed)
PA for Ajovy completed on covermymeds (key: [id number]). Pt has coverage through [facility] [place]/[facility] [phone]). Approved through 06/29/2021.

## 2020-06-30 NOTE — Telephone Encounter (Signed)
We will discuss when I see her next visit. Not going to send in something without full discussion. Thanks

## 2020-07-02 MED FILL — AJOVY 225 MG/1.5ML SOAJ: 225 | 30 days supply | Qty: 2 | Fill #5

## 2020-07-02 MED FILL — DICYCLOMINE 10 MG CAPSULE: 10 | 30 days supply | Qty: 90 | Fill #2

## 2020-07-08 ENCOUNTER — Other Ambulatory Visit: Payer: Self-pay

## 2020-07-08 ENCOUNTER — Encounter (INDEPENDENT_AMBULATORY_CARE_PROVIDER_SITE_OTHER): Payer: Self-pay | Admitting: Physician Assistant

## 2020-07-08 ENCOUNTER — Ambulatory Visit (INDEPENDENT_AMBULATORY_CARE_PROVIDER_SITE_OTHER): Payer: 59 | Admitting: Physician Assistant

## 2020-07-08 VITALS — BP 109/72 | HR 94 | Temp 98.5°F | Ht <= 58 in | Wt 151.0 lb

## 2020-07-08 DIAGNOSIS — I152 Hypertension secondary to endocrine disorders: Secondary | ICD-10-CM | POA: Diagnosis not present

## 2020-07-08 DIAGNOSIS — E1159 Type 2 diabetes mellitus with other circulatory complications: Secondary | ICD-10-CM

## 2020-07-08 DIAGNOSIS — Z6831 Body mass index (BMI) 31.0-31.9, adult: Secondary | ICD-10-CM | POA: Diagnosis not present

## 2020-07-08 DIAGNOSIS — Z9189 Other specified personal risk factors, not elsewhere classified: Secondary | ICD-10-CM | POA: Diagnosis not present

## 2020-07-08 DIAGNOSIS — E669 Obesity, unspecified: Secondary | ICD-10-CM

## 2020-07-10 ENCOUNTER — Other Ambulatory Visit: Payer: Self-pay | Admitting: Nurse Practitioner

## 2020-07-10 MED FILL — PROCTOZONE-HC 2.5 % CREA: 2.5 | 7 days supply | Qty: 30 | Fill #1

## 2020-07-10 MED FILL — FLUTICASONE PROP 50 MCG SPR: 50 | 30 days supply | Qty: 16 | Fill #2

## 2020-07-10 MED FILL — GABAPENTIN 300 MG CAPSULE: 300 | 90 days supply | Qty: 270 | Fill #0

## 2020-07-10 MED FILL — ROSUVASTATIN CALCIUM 5 MG T: 5 | 30 days supply | Qty: 30 | Fill #0

## 2020-07-11 ENCOUNTER — Encounter: Payer: Self-pay | Admitting: Nurse Practitioner

## 2020-07-11 ENCOUNTER — Ambulatory Visit (INDEPENDENT_AMBULATORY_CARE_PROVIDER_SITE_OTHER): Payer: 59 | Admitting: Nurse Practitioner

## 2020-07-11 ENCOUNTER — Other Ambulatory Visit: Payer: Self-pay

## 2020-07-11 VITALS — BP 113/74 | HR 81 | Temp 98.4°F | Ht <= 58 in | Wt 149.0 lb

## 2020-07-11 DIAGNOSIS — R109 Unspecified abdominal pain: Secondary | ICD-10-CM

## 2020-07-11 DIAGNOSIS — R1084 Generalized abdominal pain: Secondary | ICD-10-CM

## 2020-07-11 DIAGNOSIS — G8929 Other chronic pain: Secondary | ICD-10-CM

## 2020-07-11 DIAGNOSIS — N898 Other specified noninflammatory disorders of vagina: Secondary | ICD-10-CM | POA: Diagnosis not present

## 2020-07-11 DIAGNOSIS — E1165 Type 2 diabetes mellitus with hyperglycemia: Secondary | ICD-10-CM

## 2020-07-11 LAB — POCT URINALYSIS DIPSTICK
Bilirubin, UA: NEGATIVE
Blood, UA: NEGATIVE
Glucose, UA: NEGATIVE
Ketones, UA: NEGATIVE
Leukocytes, UA: NEGATIVE
Nitrite, UA: NEGATIVE
Protein, UA: NEGATIVE
Spec Grav, UA: 1.02 (ref 1.010–1.025)
Urobilinogen, UA: 0.2 E.U./dL
pH, UA: 6.5 (ref 5.0–8.0)

## 2020-07-11 NOTE — Progress Notes (Signed)
Established Patient Office Visit  Subjective:  Patient ID: Betty Cain, female    DOB: 09-May-1975  Age: 45 y.o. MRN: [id number]  CC:  Chief Complaint  Patient presents with  . Follow-up    4 week follow up, notice this about for 4 weeks ago , mild gotten worse in the couple days  Pain in rt side , having clear vaginal discharge     HPI Betty Cain presents for diabetes. She is also having a plethora of other symptoms. Patient has been assessing glucose levels with a range 90-120. She has been on "a strict diet" but is unable to work on treadmill for longer than 5-10 minutes because of leg pain. Patient complains of increased urinary frequency, voiding smaller amounts. Pt reports drinking caffienated beverages such as tea, pepsi and some water 32 oz daily. She also reports sharp pain in her lower abdominal area 8/10 worsening on the RLQ. Pt has clear watery vaginal discharge. LMP August 2021. Pt endorses constipation with BM QOD, intermittent nausea. She denies pain/blood with BM.     Past Medical History:  Diagnosis Date  . Allergy   . Anemia   . Anxiety 01/2019  . Asthma   . B12 deficiency   . Back pain   . Common migraine with intractable migraine 06/28/2017  . Constipation   . Diabetes ([place]) 02/2019  . Fatigue   . GERD (gastroesophageal reflux disease)    pos H pylori  . Hypertension    controlled with medication  . IBS (irritable bowel syndrome) 2005  . Joint pain   . Neonatal death    Vaginal delivery, full term-lived x2 hours.   . Palpitations   . Shortness of breath   . Shortness of breath on exertion   . Spinal headache   . Swelling of both lower extremities   . Valvular heart disease   . Vitamin D deficiency 02/2019    Past Surgical History:  Procedure Laterality Date  . ADENOIDECTOMY     and tonsils (as a child)  . boil  2003   right elbow  . CESAREAN SECTION     x4  . CESAREAN SECTION  06/02/2011   Procedure: CESAREAN SECTION;  Surgeon: [name], MD;  Location: [facility];  Service: Gynecology;  Laterality: N/A;  Primary Cesarean Section Delivery Baby Boy @ 0004, Apgars 9/9  . CESAREAN SECTION N/A 12/10/2013   Procedure: REPEAT CESAREAN SECTION;  Surgeon: [name], MD;  Location: [facility];  Service: Obstetrics;  Laterality: N/A;  . CESAREAN SECTION    . colonoscopy  11/23/2018   stated had issues with sleep when in for procedure  . OTHER SURGICAL HISTORY  2005   Uterine surgery   . uterine cauterization      Family History  Problem Relation Age of Onset  . Diabetes Mother   . Diabetes Father   . Diabetes Sister   . Anesthesia problems Neg Hx   . Colon cancer Neg Hx   . Esophageal cancer Neg Hx   . Rectal cancer Neg Hx   . Stomach cancer Neg Hx   . Colon polyps Neg Hx     Social History   Socioeconomic History  . Marital status: Married    Spouse name: [name]  . Number of children: 4  . Years of education: Not on file  . Highest education level: Not on file  Occupational History  . Occupation: Unemployed  Tobacco Use  . Smoking status: Never Smoker  .  Smokeless tobacco: Never Used  Vaping Use  . Vaping Use: Never used  Substance and Sexual Activity  . Alcohol use: No  . Drug use: No  . Sexual activity: Yes    Birth control/protection: None, Implant    Comment: pregnant  Other Topics Concern  . Not on file  Social History Narrative   Lives with husband and child   Caffeine use: daily (tea), sometimes coffee/soda   Right handed    Social Determinants of Health   Financial Resource Strain: Not on file  Food Insecurity: No Food Insecurity  . Worried About Charity fundraiser in the Last Year: Never true  . Ran Out of Food in the Last Year: Never true  Transportation Needs: Not on file  Physical Activity: Not on file  Stress: Not on file  Social Connections: Not on file  Intimate Partner Violence: Not on file    Outpatient Medications Prior to Visit  Medication Sig Dispense Refill  .  acetaminophen (TYLENOL) 500 MG tablet Take 500 mg by mouth every 6 (six) hours as needed.    Marland Kitchen albuterol (VENTOLIN HFA) 108 (90 Base) MCG/ACT inhaler Inhale 2 puffs into the lungs every 6 (six) hours as needed for wheezing or shortness of breath. 8 g 2  . Blood Glucose Monitoring Suppl (ONETOUCH VERIO IQ SYSTEM) w/Device KIT 1 application by Does not apply route 4 (four) times daily. 1 kit 0  . Blood Pressure Monitoring DEVI 1 each by Does not apply route daily. 1 Device 0  . CARAFATE 1 GM/10ML suspension TAKE 10 MLS (1 G TOTAL) BY MOUTH EVERY 6 (SIX) HOURS AS NEEDED. 420 mL 2  . ferrous sulfate (FEROSUL) 325 (65 FE) MG tablet TAKE 1 TABLET (325 MG TOTAL) BY MOUTH DAILY. 90 tablet 3  . fluticasone (FLONASE) 50 MCG/ACT nasal spray Place 2 sprays into both nostrils daily. 16 g 11  . fluticasone furoate-vilanterol (BREO ELLIPTA) 100-25 MCG/INH AEPB INHALE 1 PUFF INTO THE LUNGS DAILY. 60 each 3  . Fremanezumab-vfrm (AJOVY) 225 MG/1.5ML SOAJ Inject 225 mg into the skin every 30 (thirty) days. 1.5 mL 11  . gabapentin (NEURONTIN) 300 MG capsule 1 cap am and 2 QHs 270 capsule 3  . glucose blood test strip Use as instructed 100 each 0  . hydrocortisone (ANUSOL-HC) 2.5 % rectal cream Place 1 application rectally 2 (two) times daily. 30 g 1  . meloxicam (MOBIC) 15 MG tablet Take 1 tablet (15 mg total) by mouth daily. 30 tablet 11  . metFORMIN (GLUCOPHAGE-XR) 500 MG 24 hr tablet Take 1 tablet (500 mg total) by mouth 2 (two) times daily. 180 tablet 3  . methylcellulose (CITRUCEL) oral powder Take as directed, daily    . metoprolol succinate (TOPROL XL) 100 MG 24 hr tablet Take 1 tablet (100 mg total) by mouth daily. Take with or immediately following a meal. 90 tablet 3  . Multiple Vitamin (MULTI VITAMIN DAILY PO) Take by mouth.    . Multiple Vitamins-Minerals (ALIVE WOMENS ENERGY) TABS Take 1 tablet by mouth daily.    . norgestimate-ethinyl estradiol (SPRINTEC 28) 0.25-35 MG-MCG tablet Take 1 tablet by mouth  daily. 30 tablet 11  . nortriptyline (PAMELOR) 25 MG capsule Take 1 capsule (25 mg total) by mouth at bedtime. 90 capsule 3  . ondansetron (ZOFRAN-ODT) 8 MG disintegrating tablet TAKE 1 TABLET BY MOUTH EVERY 8 HOURS AS NEEDED FOR NAUSEA OR VOMITING. 30 tablet 0  . OneTouch Delica Lancets 81W MISC 1 application by Does  not apply route in the morning, at noon, in the evening, and at bedtime. 100 each 11  . pantoprazole (PROTONIX) 40 MG tablet TAKE 1 TABLET BY MOUTH 2 TIMES DAILY BEFORE A MEAL. 180 tablet 1  . potassium chloride 20 MEQ/15ML (10%) SOLN TAKE 15 MLS (20 MEQ TOTAL) BY MOUTH DAILY. 473 mL 2  . Rimegepant Sulfate (NURTEC) 75 MG TBDP Take 1 tablet at the onset of migraine. 30 tablet 1  . rosuvastatin (CRESTOR) 5 MG tablet TAKE 1 TABLET (5 MG TOTAL) BY MOUTH AT BEDTIME. 60 tablet 11  . tiZANidine (ZANAFLEX) 2 MG tablet Take 1 tablet (2 mg total) by mouth every 8 (eight) hours as needed (as needed for acute headache). 20 tablet 1  . vitamin B-12 (CYANOCOBALAMIN) 1000 MCG tablet Take 5 tablets (5,000 mcg total) by mouth daily. 450 tablet 3  . Vitamin D, Ergocalciferol, (DRISDOL) 1.25 MG (50000 UNIT) CAPS capsule Take 1 capsule (50,000 Units total) by mouth every 7 (seven) days. 4 capsule 0  . levocetirizine (XYZAL) 5 MG tablet Take 1 tablet (5 mg total) by mouth every evening. 90 tablet 3   No facility-administered medications prior to visit.    Allergies  Allergen Reactions  . Topamax [Topiramate] Itching and Rash    ROS Review of Systems  Constitutional: Positive for chills. Negative for fever.       Chills x 10 days, Temperature assessed at home 100.0  HENT: Positive for sore throat. Negative for congestion, ear pain, rhinorrhea and sinus pain.        Oral mucous "sour"   Eyes: Negative.  Negative for redness.  Respiratory: Positive for shortness of breath. Negative for cough.        SOB worsening over the last last 10-15 minutes for the last 3-4 years  Cardiovascular: Positive  for palpitations.       CP worsening over the last last 10-15 minutes for the last 3-4 years   Gastrointestinal: Positive for constipation and nausea. Negative for vomiting.  Endocrine: Positive for polydipsia and polyuria. Negative for polyphagia.       Increased urinary frequency voiding small amounts  Genitourinary: Positive for flank pain, urgency and vaginal discharge.       Clear vaginal discharge. LMP August 2021, currently in use of birth control pills.  Skin: Negative.   Allergic/Immunologic: Positive for environmental allergies.       Seasonal allergies worse in fall but takes medication daily to assist in symptom management.  Neurological: Positive for dizziness, weakness and headaches.       Symptoms Dizzy, weak, migraines greater than 2 years  Hematological: Does not bruise/bleed easily.  Psychiatric/Behavioral: Negative for self-injury and suicidal ideas. The patient is not nervous/anxious.       Objective:    Physical Exam Constitutional:      Appearance: Normal appearance.  Cardiovascular:     Rate and Rhythm: Normal rate and regular rhythm.     Pulses: Normal pulses.     Heart sounds: Normal heart sounds. No murmur heard.   Pulmonary:     Effort: Pulmonary effort is normal. No respiratory distress.     Breath sounds: Normal breath sounds. No wheezing.  Abdominal:     General: Bowel sounds are normal.     Palpations: Abdomen is soft.     Tenderness: There is abdominal tenderness. There is right CVA tenderness.     Comments: Generalized abdominal discomfort, worsening RLQ  Skin:    General: Skin is warm and dry.  Neurological:     General: No focal deficit present.     Mental Status: She is alert and oriented to person, place, and time. Mental status is at baseline.  Psychiatric:        Mood and Affect: Mood normal.        Behavior: Behavior normal.        Thought Content: Thought content normal.        Judgment: Judgment normal.     BP 113/74 (BP  Location: Left Arm, Patient Position: Sitting, Cuff Size: Normal)   Pulse 81   Temp 98.4 F (36.9 C) (Temporal)   Ht 4' 10"  (1.473 m)   Wt 149 lb (67.6 kg)   SpO2 99%   BMI 31.14 kg/m  Wt Readings from Last 3 Encounters:  07/11/20 149 lb (67.6 kg)  07/08/20 151 lb (68.5 kg)  06/19/20 157 lb (71.2 kg)     Health Maintenance Due  Topic Date Due  . Hepatitis C Screening  Never done  . PNEUMOCOCCAL POLYSACCHARIDE VACCINE AGE 44-64 HIGH RISK  Never done  . OPHTHALMOLOGY EXAM  Never done  . PAP SMEAR-Modifier  06/19/2016  . COVID-19 Vaccine (3 - Booster for Pfizer series) 01/27/2020    There are no preventive care reminders to display for this patient.  Lab Results  Component Value Date   TSH 2.040 06/13/2020   Lab Results  Component Value Date   WBC 7.7 06/13/2020   HGB 10.8 (L) 06/13/2020   HCT 33.0 (L) 06/13/2020   MCV 81 06/13/2020   PLT 356 06/13/2020   Lab Results  Component Value Date   NA 142 06/13/2020   K 4.3 06/13/2020   CO2 21 05/21/2019   GLUCOSE 76 06/13/2020   BUN 14 06/13/2020   CREATININE 0.55 (L) 06/13/2020   BILITOT 0.2 06/13/2020   ALKPHOS 91 06/13/2020   AST 14 06/13/2020   ALT 14 05/21/2019   PROT 7.5 06/13/2020   ALBUMIN 4.5 06/13/2020   CALCIUM 9.6 06/13/2020   ANIONGAP 11 08/30/2018   GFR 119.42 08/28/2018   Lab Results  Component Value Date   CHOL 122 06/13/2020   Lab Results  Component Value Date   HDL 36 (L) 06/13/2020   Lab Results  Component Value Date   LDLCALC 63 06/13/2020   Lab Results  Component Value Date   TRIG 127 06/13/2020   Lab Results  Component Value Date   CHOLHDL 3.4 06/13/2020   Lab Results  Component Value Date   HGBA1C 5.7 (A) 06/13/2020      Assessment & Plan:  Vaginal/Pelvic ultrasound U/A-  Problem List Items Addressed This Visit   None   Visit Diagnoses    Stomach pain    -  Primary   Relevant Orders   POCT Urinalysis Dipstick (Completed)      No orders of the defined types  were placed in this encounter.   Follow-up: No follow-ups on file.    Hermina Staggers, RN

## 2020-07-11 NOTE — Progress Notes (Signed)
Established Patient Office Visit  Subjective:  Patient ID: Betty Cain, female    DOB: 1975-11-05  Age: 45 y.o. MRN: [id number]  CC:  Chief Complaint  Patient presents with  . Follow-up    4 week follow up, notice this about for 4 weeks ago , mild gotten worse in the couple days  Pain in rt side , having clear vaginal discharge     Betty Cain presents for follow-up.   Past Medical History:  Diagnosis Date  . Allergy   . Anemia   . Anxiety 01/2019  . Asthma   . B12 deficiency   . Back pain   . Common migraine with intractable migraine 06/28/2017  . Constipation   . Diabetes ([place]) 02/2019  . Fatigue   . GERD (gastroesophageal reflux disease)    pos H pylori  . Hypertension    controlled with medication  . IBS (irritable bowel syndrome) 2005  . Joint pain   . Neonatal death    Vaginal delivery, full term-lived x2 hours.   . Palpitations   . Shortness of breath   . Shortness of breath on exertion   . Spinal headache   . Swelling of both lower extremities   . Valvular heart disease   . Vitamin D deficiency 02/2019    Past Surgical History:  Procedure Laterality Date  . ADENOIDECTOMY     and tonsils (as a child)  . boil  2003   right elbow  . CESAREAN SECTION     x4  . CESAREAN SECTION  06/02/2011   Procedure: CESAREAN SECTION;  Surgeon: [name], MD;  Location: [facility];  Service: Gynecology;  Laterality: N/A;  Primary Cesarean Section Delivery Baby Boy @ 0004, Apgars 9/9  . CESAREAN SECTION N/A 12/10/2013   Procedure: REPEAT CESAREAN SECTION;  Surgeon: [name], MD;  Location: [facility];  Service: Obstetrics;  Laterality: N/A;  . CESAREAN SECTION    . colonoscopy  11/23/2018   stated had issues with sleep when in for procedure  . OTHER SURGICAL HISTORY  2005   Uterine surgery   . uterine cauterization      Family History  Problem Relation Age of Onset  . Diabetes Mother   . Diabetes Father   . Diabetes Sister   . Anesthesia problems Neg  Hx   . Colon cancer Neg Hx   . Esophageal cancer Neg Hx   . Rectal cancer Neg Hx   . Stomach cancer Neg Hx   . Colon polyps Neg Hx     Social History   Socioeconomic History  . Marital status: Married    Spouse name: [name]  . Number of children: 4  . Years of education: Not on file  . Highest education level: Not on file  Occupational History  . Occupation: Unemployed  Tobacco Use  . Smoking status: Never Smoker  . Smokeless tobacco: Never Used  Vaping Use  . Vaping Use: Never used  Substance and Sexual Activity  . Alcohol use: No  . Drug use: No  . Sexual activity: Yes    Birth control/protection: None, Implant    Comment: pregnant  Other Topics Concern  . Not on file  Social History Narrative   Lives with husband and child   Caffeine use: daily (tea), sometimes coffee/soda   Right handed    Social Determinants of Health   Financial Resource Strain: Not on file  Food Insecurity: No Food Insecurity  . Worried About Running  Out of Food in the Last Year: Never true  . Ran Out of Food in the Last Year: Never true  Transportation Needs: Not on file  Physical Activity: Not on file  Stress: Not on file  Social Connections: Not on file  Intimate Partner Violence: Not on file    Outpatient Medications Prior to Visit  Medication Sig Dispense Refill  . acetaminophen (TYLENOL) 500 MG tablet Take 500 mg by mouth every 6 (six) hours as needed.    Marland Kitchen albuterol (VENTOLIN HFA) 108 (90 Base) MCG/ACT inhaler Inhale 2 puffs into the lungs every 6 (six) hours as needed for wheezing or shortness of breath. 8 g 2  . Blood Glucose Monitoring Suppl (ONETOUCH VERIO IQ SYSTEM) w/Device KIT 1 application by Does not apply route 4 (four) times daily. 1 kit 0  . Blood Pressure Monitoring DEVI 1 each by Does not apply route daily. 1 Device 0  . ferrous sulfate (FEROSUL) 325 (65 FE) MG tablet TAKE 1 TABLET (325 MG TOTAL) BY MOUTH DAILY. 90 tablet 3  . methylcellulose (CITRUCEL) oral  powder Take as directed, daily    . Multiple Vitamin (MULTI VITAMIN DAILY PO) Take by mouth.    . Multiple Vitamins-Minerals (ALIVE WOMENS ENERGY) TABS Take 1 tablet by mouth daily.    . nortriptyline (PAMELOR) 25 MG capsule Take 1 capsule (25 mg total) by mouth at bedtime. 90 capsule 3  . ondansetron (ZOFRAN-ODT) 8 MG disintegrating tablet TAKE 1 TABLET BY MOUTH EVERY 8 HOURS AS NEEDED FOR NAUSEA OR VOMITING. 30 tablet 0  . OneTouch Delica Lancets 86V MISC 1 application by Does not apply route in the morning, at noon, in the evening, and at bedtime. 100 each 11  . vitamin B-12 (CYANOCOBALAMIN) 1000 MCG tablet Take 5 tablets (5,000 mcg total) by mouth daily. 450 tablet 3  . CARAFATE 1 GM/10ML suspension TAKE 10 MLS (1 G TOTAL) BY MOUTH EVERY 6 (SIX) HOURS AS NEEDED. 420 mL 2  . fluticasone (FLONASE) 50 MCG/ACT nasal spray Place 2 sprays into both nostrils daily. 16 g 11  . fluticasone furoate-vilanterol (BREO ELLIPTA) 100-25 MCG/INH AEPB INHALE 1 PUFF INTO THE LUNGS DAILY. 60 each 3  . Fremanezumab-vfrm (AJOVY) 225 MG/1.5ML SOAJ Inject 225 mg into the skin every 30 (thirty) days. 1.5 mL 11  . gabapentin (NEURONTIN) 300 MG capsule 1 cap am and 2 QHs 270 capsule 3  . glucose blood test strip Use as instructed 100 each 0  . hydrocortisone (ANUSOL-HC) 2.5 % rectal cream Place 1 application rectally 2 (two) times daily. 30 g 1  . meloxicam (MOBIC) 15 MG tablet Take 1 tablet (15 mg total) by mouth daily. 30 tablet 11  . metFORMIN (GLUCOPHAGE-XR) 500 MG 24 hr tablet Take 1 tablet (500 mg total) by mouth 2 (two) times daily. 180 tablet 3  . metoprolol succinate (TOPROL XL) 100 MG 24 hr tablet Take 1 tablet (100 mg total) by mouth daily. Take with or immediately following a meal. 90 tablet 3  . norgestimate-ethinyl estradiol (SPRINTEC 28) 0.25-35 MG-MCG tablet Take 1 tablet by mouth daily. 30 tablet 11  . pantoprazole (PROTONIX) 40 MG tablet TAKE 1 TABLET BY MOUTH 2 TIMES DAILY BEFORE A MEAL. 180 tablet 1   . potassium chloride 20 MEQ/15ML (10%) SOLN TAKE 15 MLS (20 MEQ TOTAL) BY MOUTH DAILY. 473 mL 2  . Rimegepant Sulfate (NURTEC) 75 MG TBDP Take 1 tablet at the onset of migraine. 30 tablet 1  . rosuvastatin (CRESTOR) 5 MG  tablet TAKE 1 TABLET (5 MG TOTAL) BY MOUTH AT BEDTIME. 60 tablet 11  . tiZANidine (ZANAFLEX) 2 MG tablet Take 1 tablet (2 mg total) by mouth every 8 (eight) hours as needed (as needed for acute headache). 20 tablet 1  . Vitamin D, Ergocalciferol, (DRISDOL) 1.25 MG (50000 UNIT) CAPS capsule Take 1 capsule (50,000 Units total) by mouth every 7 (seven) days. 4 capsule 0  . levocetirizine (XYZAL) 5 MG tablet Take 1 tablet (5 mg total) by mouth every evening. 90 tablet 3   No facility-administered medications prior to visit.    Allergies  Allergen Reactions  . Topamax [Topiramate] Itching and Rash    ROS Review of Systems  Gastrointestinal:        Worsen RLQ  Genitourinary: Positive for pelvic pain and vaginal pain (pressure occasional). Negative for urgency.       Uses panty liner unable to tell Feels "mucous"      Objective:    Physical Exam Constitutional:      General: She is not in acute distress.    Appearance: She is obese. She is not ill-appearing, toxic-appearing or diaphoretic.  Cardiovascular:     Rate and Rhythm: Normal rate and regular rhythm.     Pulses: Normal pulses.     Heart sounds: Normal heart sounds.  Pulmonary:     Effort: Pulmonary effort is normal.     Breath sounds: Normal breath sounds.  Abdominal:     General: Bowel sounds are normal.     Palpations: Abdomen is soft.  Musculoskeletal:        General: Normal range of motion.     Cervical back: Normal range of motion.  Skin:    General: Skin is warm.     Capillary Refill: Capillary refill takes less than 2 seconds.  Neurological:     General: No focal deficit present.     Mental Status: She is alert and oriented to person, place, and time.  Psychiatric:     Comments:  Questionable     BP 113/74 (BP Location: Left Arm, Patient Position: Sitting, Cuff Size: Normal)   Pulse 81   Temp 98.4 F (36.9 C) (Temporal)   Ht 4' 10"  (1.473 m)   Wt 149 lb (67.6 kg)   SpO2 99%   BMI 31.14 kg/m  Wt Readings from Last 3 Encounters:  07/11/20 149 lb (67.6 kg)  07/08/20 151 lb (68.5 kg)  06/19/20 157 lb (71.2 kg)     Health Maintenance Due  Topic Date Due  . Hepatitis C Screening  Never done  . PNEUMOCOCCAL POLYSACCHARIDE VACCINE AGE 34-64 HIGH RISK  Never done  . OPHTHALMOLOGY EXAM  Never done  . PAP SMEAR-Modifier  06/19/2016  . COVID-19 Vaccine (3 - Booster for Pfizer series) 01/27/2020    There are no preventive care reminders to display for this patient.  Lab Results  Component Value Date   TSH 2.040 06/13/2020   Lab Results  Component Value Date   WBC 7.7 06/13/2020   HGB 10.8 (L) 06/13/2020   HCT 33.0 (L) 06/13/2020   MCV 81 06/13/2020   PLT 356 06/13/2020   Lab Results  Component Value Date   NA 142 06/13/2020   K 4.3 06/13/2020   CO2 21 05/21/2019   GLUCOSE 76 06/13/2020   BUN 14 06/13/2020   CREATININE 0.55 (L) 06/13/2020   BILITOT 0.2 06/13/2020   ALKPHOS 91 06/13/2020   AST 14 06/13/2020   ALT 14 05/21/2019  PROT 7.5 06/13/2020   ALBUMIN 4.5 06/13/2020   CALCIUM 9.6 06/13/2020   ANIONGAP 11 08/30/2018   GFR 119.42 08/28/2018   Lab Results  Component Value Date   CHOL 122 06/13/2020   Lab Results  Component Value Date   HDL 36 (L) 06/13/2020   Lab Results  Component Value Date   LDLCALC 63 06/13/2020   Lab Results  Component Value Date   TRIG 127 06/13/2020   Lab Results  Component Value Date   CHOLHDL 3.4 06/13/2020   Lab Results  Component Value Date   HGBA1C 5.7 (A) 06/13/2020      Assessment & Plan:   Problem List Items Addressed This Visit      Endocrine   Type 2 diabetes mellitus with hyperglycemia, without long-term current use of insulin ([place])    Other Visit Diagnoses    Stomach pain     -  Primary   Vaginal discharge       Relevant Orders   US Pelvic Complete With Transvaginal   Bacterial Vaginosis, NAA (Completed)   Chronic generalized abdominal pain       Relevant Orders   POCT Urinalysis Dipstick (Completed)   Bacterial Vaginosis, NAA (Completed)      No orders of the defined types were placed in this encounter.   Follow-up: Return for Appointment As Scheduled.    Vevelyn Francois, NP

## 2020-07-11 NOTE — Patient Instructions (Signed)
Healthy Eating Following a healthy eating pattern may help you to achieve and maintain a healthy body weight, reduce the risk of chronic disease, and live a long and productive life. It is important to follow a healthy eating pattern at an appropriate calorie level for your body. Your nutritional needs should be met primarily through food by choosing a variety of nutrient-rich foods. What are tips for following this plan? Reading food labels  Read labels and choose the following: ? Reduced or low sodium. ? Juices with 100% fruit juice. ? Foods with low saturated fats and high polyunsaturated and monounsaturated fats. ? Foods with whole grains, such as whole wheat, cracked wheat, brown rice, and wild rice. ? Whole grains that are fortified with folic acid. This is recommended for women who are pregnant or who want to become pregnant.  Read labels and avoid the following: ? Foods with a lot of added sugars. These include foods that contain brown sugar, corn sweetener, corn syrup, dextrose, fructose, glucose, high-fructose corn syrup, honey, invert sugar, lactose, malt syrup, maltose, molasses, raw sugar, sucrose, trehalose, or turbinado sugar.  Do not eat more than the following amounts of added sugar per day:  6 teaspoons (25 g) for women.  9 teaspoons (38 g) for men. ? Foods that contain processed or refined starches and grains. ? Refined grain products, such as white flour, degermed cornmeal, white bread, and white rice. Shopping  Choose nutrient-rich snacks, such as vegetables, whole fruits, and nuts. Avoid high-calorie and high-sugar snacks, such as potato chips, fruit snacks, and candy.  Use oil-based dressings and spreads on foods instead of solid fats such as butter, stick margarine, or cream cheese.  Limit pre-made sauces, mixes, and "instant" products such as flavored rice, instant noodles, and ready-made pasta.  Try more plant-protein sources, such as tofu, tempeh, black beans,  edamame, lentils, nuts, and seeds.  Explore eating plans such as the Mediterranean diet or vegetarian diet. Cooking  Use oil to saut or stir-fry foods instead of solid fats such as butter, stick margarine, or lard.  Try baking, boiling, grilling, or broiling instead of frying.  Remove the fatty part of meats before cooking.  Steam vegetables in water or broth. Meal planning  At meals, imagine dividing your plate into fourths: ? One-half of your plate is fruits and vegetables. ? One-fourth of your plate is whole grains. ? One-fourth of your plate is protein, especially lean meats, poultry, eggs, tofu, beans, or nuts.  Include low-fat dairy as part of your daily diet.   Lifestyle  Choose healthy options in all settings, including home, work, school, restaurants, or stores.  Prepare your food safely: ? Wash your hands after handling raw meats. ? Keep food preparation surfaces clean by regularly washing with hot, soapy water. ? Keep raw meats separate from ready-to-eat foods, such as fruits and vegetables. ? Cook seafood, meat, poultry, and eggs to the recommended internal temperature. ? Store foods at safe temperatures. In general:  Keep cold foods at 7F (4.4C) or below.  Keep hot foods at 17F (60C) or above.  Keep your freezer at [facility] (-17.8C) or below.  Foods are no longer safe to eat when they have been between the temperatures of 40-17F (4.4-60C) for more than 2 hours. What foods should I eat? Fruits Aim to eat 2 cup-equivalents of fresh, canned (in natural juice), or frozen fruits each day. Examples of 1 cup-equivalent of fruit include 1 small apple, 8 large strawberries, 1 cup canned fruit,  cup dried fruit, or 1 cup 100% juice. Vegetables Aim to eat 2-3 cup-equivalents of fresh and frozen vegetables each day, including different varieties and colors. Examples of 1 cup-equivalent of vegetables include 2 medium carrots, 2 cups raw, leafy greens, 1 cup chopped  vegetable (raw or cooked), or 1 medium baked potato. Grains Aim to eat 6 ounce-equivalents of whole grains each day. Examples of 1 ounce-equivalent of grains include 1 slice of bread, 1 cup ready-to-eat cereal, 3 cups popcorn, or  cup cooked rice, pasta, or cereal. Meats and other proteins Aim to eat 5-6 ounce-equivalents of protein each day. Examples of 1 ounce-equivalent of protein include 1 egg, 1/2 cup nuts or seeds, or 1 tablespoon (16 g) peanut butter. A cut of meat or fish that is the size of a deck of cards is about 3-4 ounce-equivalents.  Of the protein you eat each week, try to have at least 8 ounces come from seafood. This includes salmon, trout, herring, and anchovies. Dairy Aim to eat 3 cup-equivalents of fat-free or low-fat dairy each day. Examples of 1 cup-equivalent of dairy include 1 cup (240 mL) milk, 8 ounces (250 g) yogurt, 1 ounces (44 g) natural cheese, or 1 cup (240 mL) fortified soy milk. Fats and oils  Aim for about 5 teaspoons (21 g) per day. Choose monounsaturated fats, such as canola and olive oils, avocados, peanut butter, and most nuts, or polyunsaturated fats, such as sunflower, corn, and soybean oils, walnuts, pine nuts, sesame seeds, sunflower seeds, and flaxseed. Beverages  Aim for six 8-oz glasses of water per day. Limit coffee to three to five 8-oz cups per day.  Limit caffeinated beverages that have added calories, such as soda and energy drinks.  Limit alcohol intake to no more than 1 drink a day for nonpregnant women and 2 drinks a day for men. One drink equals 12 oz of beer (355 mL), 5 oz of wine (148 mL), or 1 oz of hard liquor (44 mL). Seasoning and other foods  Avoid adding excess amounts of salt to your foods. Try flavoring foods with herbs and spices instead of salt.  Avoid adding sugar to foods.  Try using oil-based dressings, sauces, and spreads instead of solid fats. This information is based on general [place] nutrition guidelines. For more  information, visit choosemyplate.gov. Exact amounts may vary based on your nutrition needs. Summary  A healthy eating plan may help you to maintain a healthy weight, reduce the risk of chronic diseases, and stay active throughout your life.  Plan your meals. Make sure you eat the right portions of a variety of nutrient-rich foods.  Try baking, boiling, grilling, or broiling instead of frying.  Choose healthy options in all settings, including home, work, school, restaurants, or stores. This information is not intended to replace advice given to you by your health care provider. Make sure you discuss any questions you have with your health care provider. Document Revised: 07/11/2017 Document Reviewed: 07/11/2017 Elsevier Patient Education  2021 [employer] Inc.  

## 2020-07-12 ENCOUNTER — Other Ambulatory Visit: Payer: Self-pay

## 2020-07-14 LAB — BACTERIAL VAGINOSIS, NAA

## 2020-07-14 NOTE — Progress Notes (Signed)
Chief Complaint:   OBESITY Betty Cain is here to discuss her progress with her obesity treatment plan along with follow-up of her obesity related diagnoses. Betty Cain is on the Category 2 Plan and states she is following her eating plan approximately 95% of the time. Betty Cain states she is on the treadmill for 15-30 minutes 7 times per week.  Today's visit was #: 9 Starting weight: 152 lbs Starting date: 01/10/2020 Today's weight: 151 lbs Today's date: 07/08/2020 Total lbs lost to date: 1 Total lbs lost since last in-office visit: 0  Interim History: Betty Cain is bored with the food. She is using oil to cook and she is eating high fat beef. She is planning to fast for the month for Ramadan.   Subjective:   1. Type 2 diabetes mellitus associated with hypertension (Betty Cain) Betty Cain is on metformin, and she is managed by her primary care provider. Her BGs are stable since she is off of glimepiride. She wants to fast for Ramadan.  2. Hypertension associated with diabetes (Betty Cain) Betty Cain blood pressure is controlled, and she is managed by her primary care provider.  3. At risk for heart disease Betty Cain is at a higher than average risk for cardiovascular disease due to obesity.   Assessment/Plan:   1. Type 2 diabetes mellitus associated with hypertension (Betty Cain) Good blood sugar control is important to decrease the likelihood of diabetic complications such as nephropathy, neuropathy, limb loss, blindness, coronary artery disease, and death. Intensive lifestyle modification including diet, exercise and weight loss are the first line of treatment for diabetes. [place]let was advised to have a healthy snack throughout the day during Ramadan to avoid hypoglycemia, but she does not want to. I emphasized the importance of BGs monitoring.  2. Hypertension associated with diabetes (Betty Cain) Betty Cain will continue her medications, and meal plan, and will continue working on healthy weight loss and exercise to improve blood pressure control.  We will watch for signs of hypotension as she continues her lifestyle modifications.  3. At risk for heart disease Betty Cain was given approximately 15 minutes of coronary artery disease prevention counseling today. She is 45 y.o. female and has risk factors for heart disease including obesity. We discussed intensive lifestyle modifications today with an emphasis on specific weight loss instructions and strategies.   Repetitive spaced learning was employed today to elicit superior memory formation and behavioral change.  4. Class 1 obesity with serious comorbidity and body mass index (BMI) of 31.0 to 31.9 in adult, unspecified obesity type Betty Cain is currently in the action stage of change. As such, her goal is to continue with weight loss efforts. She has agreed to keeping a food journal and adhering to recommended goals of 1100-1200 calories and 80 grams of protein daily.   Exercise goals: As is.  Behavioral modification strategies: meal planning and cooking strategies.  Betty Cain has agreed to follow-up with our clinic in 3 weeks. She was informed of the importance of frequent follow-up visits to maximize her success with intensive lifestyle modifications for her multiple health conditions.   Objective:   Blood pressure 109/72, pulse 94, temperature 98.5 F (36.9 C), height 4\' 10"  (1.473 m), weight 151 lb (68.5 kg), SpO2 100 %. Body mass index is 31.56 kg/m.  General: Cooperative, alert, well developed, in no acute distress. HEENT: Conjunctivae and lids unremarkable. Cardiovascular: Regular rhythm.  Lungs: Normal work of breathing. Neurologic: No focal deficits.   Lab Results  Component Value Date   CREATININE 0.55 (L) 06/13/2020  BUN 14 06/13/2020   NA 142 06/13/2020   K 4.3 06/13/2020   CL 102 06/13/2020   CO2 21 05/21/2019   Lab Results  Component Value Date   ALT 14 05/21/2019   AST 14 06/13/2020   ALKPHOS 91 06/13/2020   BILITOT 0.2 06/13/2020   Lab Results  Component Value  Date   HGBA1C 5.7 (A) 06/13/2020   HGBA1C 7.5 (A) 01/02/2020   HGBA1C 7.5 01/02/2020   HGBA1C 6.8 (A) 08/20/2019   HGBA1C 6.8 08/20/2019   HGBA1C 6.8 (A) 08/20/2019   HGBA1C 6.8 08/20/2019   Lab Results  Component Value Date   INSULIN 8.7 01/10/2020   Lab Results  Component Value Date   TSH 2.040 06/13/2020   Lab Results  Component Value Date   CHOL 122 06/13/2020   HDL 36 (L) 06/13/2020   LDLCALC 63 06/13/2020   TRIG 127 06/13/2020   CHOLHDL 3.4 06/13/2020   Lab Results  Component Value Date   WBC 7.7 06/13/2020   HGB 10.8 (L) 06/13/2020   HCT 33.0 (L) 06/13/2020   MCV 81 06/13/2020   PLT 356 06/13/2020   Lab Results  Component Value Date   IRON 53 01/20/2018   TIBC 317 01/20/2018   FERRITIN 61 01/20/2018   Attestation Statements:   Reviewed by clinician on day of visit: allergies, medications, problem list, medical history, surgical history, family history, social history, and previous encounter notes.   Wilhemena Durie, am acting as transcriptionist for [employer], PA-C.  I have reviewed the above documentation for accuracy and completeness, and I agree with the above. Abby Potash, PA-C

## 2020-07-18 MED FILL — Norgestimate & Ethinyl Estradiol Tab 0.25 MG-35 MCG: ORAL | 28 days supply | Qty: 28 | Fill #0 | Status: AC

## 2020-07-21 ENCOUNTER — Other Ambulatory Visit: Payer: Self-pay

## 2020-07-25 ENCOUNTER — Other Ambulatory Visit: Payer: Self-pay

## 2020-07-25 ENCOUNTER — Ambulatory Visit (HOSPITAL_COMMUNITY)
Admission: RE | Admit: 2020-07-25 | Discharge: 2020-07-25 | Disposition: A | Discharge status: Home / Self Care | Payer: 59 | Source: Ambulatory Visit | Attending: Nurse Practitioner | Admitting: Nurse Practitioner

## 2020-07-25 DIAGNOSIS — N898 Other specified noninflammatory disorders of vagina: Secondary | ICD-10-CM | POA: Insufficient documentation

## 2020-07-28 ENCOUNTER — Other Ambulatory Visit: Payer: Self-pay

## 2020-07-28 MED FILL — Levocetirizine Dihydrochloride Tab 5 MG: ORAL | 30 days supply | Qty: 30 | Fill #0 | Status: AC

## 2020-07-28 MED FILL — Tizanidine HCl Tab 2 MG (Base Equivalent): ORAL | 6 days supply | Qty: 20 | Fill #0 | Status: AC

## 2020-07-28 MED FILL — Meloxicam Tab 15 MG: ORAL | 30 days supply | Qty: 30 | Fill #0 | Status: AC

## 2020-07-31 ENCOUNTER — Other Ambulatory Visit: Payer: Self-pay

## 2020-08-04 ENCOUNTER — Ambulatory Visit (INDEPENDENT_AMBULATORY_CARE_PROVIDER_SITE_OTHER): Payer: 59 | Admitting: Physician Assistant

## 2020-08-04 ENCOUNTER — Encounter (INDEPENDENT_AMBULATORY_CARE_PROVIDER_SITE_OTHER): Payer: Self-pay | Admitting: Physician Assistant

## 2020-08-04 ENCOUNTER — Other Ambulatory Visit: Payer: Self-pay

## 2020-08-04 VITALS — BP 122/82 | HR 92 | Temp 98.7°F | Ht <= 58 in | Wt 153.0 lb

## 2020-08-04 DIAGNOSIS — E1159 Type 2 diabetes mellitus with other circulatory complications: Secondary | ICD-10-CM

## 2020-08-04 DIAGNOSIS — E669 Obesity, unspecified: Secondary | ICD-10-CM

## 2020-08-04 DIAGNOSIS — Z6831 Body mass index (BMI) 31.0-31.9, adult: Secondary | ICD-10-CM

## 2020-08-04 DIAGNOSIS — I152 Hypertension secondary to endocrine disorders: Secondary | ICD-10-CM | POA: Diagnosis not present

## 2020-08-04 DIAGNOSIS — E559 Vitamin D deficiency, unspecified: Secondary | ICD-10-CM | POA: Diagnosis not present

## 2020-08-04 DIAGNOSIS — Z9189 Other specified personal risk factors, not elsewhere classified: Secondary | ICD-10-CM

## 2020-08-04 MED ORDER — VITAMIN D (ERGOCALCIFEROL) 1.25 MG (50000 UNIT) PO CAPS
ORAL_CAPSULE | ORAL | 0 refills | Status: DC
  Filled 2020-08-04: qty 4, 28d supply, fill #0

## 2020-08-05 NOTE — Progress Notes (Signed)
Chief Complaint:   OBESITY Betty Cain is here to discuss her progress with her obesity treatment plan along with follow-up of her obesity related diagnoses. Betty Cain is on keeping a food journal and adhering to recommended goals of 1100-1200 calories and 80 grams of protein daily and states she is following her eating plan approximately 100% of the time. Betty Cain states she is doing 0 minutes 0 times per week.  Today's visit was #: 10 Starting weight: 152 lbs Starting date: 01/10/2020 Today's weight: 153 lbs Today's date: 08/04/2020 Total lbs lost to date: 0 Total lbs lost since last in-office visit: 0  Interim History: Betty Cain is practicing Ramadan currently. When she can eat, she is eating bread, vegetables soup, rice, and baklava.  Subjective:   1. Vitamin D deficiency Betty Cain is on Vit D weekly, and she denies nausea, vomiting, or muscle weakness.  2. Hypertension associated with diabetes (Betty Cain) Betty Cain denies chest pain or headache, and she is on Toprol.  3. At risk for osteoporosis Betty Cain is at higher risk of osteopenia and osteoporosis due to Vitamin D deficiency.   Assessment/Plan:   1. Vitamin D deficiency Low Vitamin D level contributes to fatigue and are associated with obesity, breast, and colon cancer. We will refill prescription Vitamin D for 1 month. Bryer will follow-up for routine testing of Vitamin D, at least 2-3 times per year to avoid over-replacement.  - Vitamin D, Ergocalciferol, (DRISDOL) 1.25 MG (50000 UNIT) CAPS capsule; TAKE 1 CAPSULE (50,000 UNITS TOTAL) BY MOUTH EVERY 7 (SEVEN) DAYS.  Dispense: 4 capsule; Refill: 0  2. Hypertension associated with diabetes (Betty Cain) Betty Cain will continue her medications, healthy weight loss, and exercise to improve blood pressure control. We will watch for signs of hypotension as she continues her lifestyle modifications.  3. At risk for osteoporosis Betty Cain was given approximately 15 minutes of osteoporosis prevention counseling today. Betty Cain is at  risk for osteopenia and osteoporosis due to her Vitamin D deficiency. She was encouraged to take her Vitamin D and follow her higher calcium diet and increase strengthening exercise to help strengthen her bones and decrease her risk of osteopenia and osteoporosis.  Repetitive spaced learning was employed today to elicit superior memory formation and behavioral change.  4. Class 1 obesity with serious comorbidity and body mass index (BMI) of 31.0 to 31.9 in adult, unspecified obesity type Betty Cain is currently in the action stage of change. As such, her goal is to continue with weight loss efforts. She has agreed to keeping a food journal and adhering to recommended goals of 1200 calories and 80 grams of protein daily.   Exercise goals: No exercise has been prescribed at this time.  Behavioral modification strategies: increasing lean protein intake and decreasing simple carbohydrates.  Betty Cain has agreed to follow-up with our clinic in 3 weeks. She was informed of the importance of frequent follow-up visits to maximize her success with intensive lifestyle modifications for her multiple health conditions.   Objective:   Blood pressure 122/82, pulse 92, temperature 98.7 F (37.1 C), height 4\' 10"  (1.473 m), weight 153 lb (69.4 kg), SpO2 98 %. Body mass index is 31.98 kg/m.  General: Cooperative, alert, well developed, in no acute distress. HEENT: Conjunctivae and lids unremarkable. Cardiovascular: Regular rhythm.  Lungs: Normal work of breathing. Neurologic: No focal deficits.   Lab Results  Component Value Date   CREATININE 0.55 (L) 06/13/2020   BUN 14 06/13/2020   NA 142 06/13/2020   K 4.3 06/13/2020   CL  102 06/13/2020   CO2 21 05/21/2019   Lab Results  Component Value Date   ALT 14 05/21/2019   AST 14 06/13/2020   ALKPHOS 91 06/13/2020   BILITOT 0.2 06/13/2020   Lab Results  Component Value Date   HGBA1C 5.7 (A) 06/13/2020   HGBA1C 7.5 (A) 01/02/2020   HGBA1C 7.5 01/02/2020    HGBA1C 6.8 (A) 08/20/2019   HGBA1C 6.8 08/20/2019   HGBA1C 6.8 (A) 08/20/2019   HGBA1C 6.8 08/20/2019   Lab Results  Component Value Date   INSULIN 8.7 01/10/2020   Lab Results  Component Value Date   TSH 2.040 06/13/2020   Lab Results  Component Value Date   CHOL 122 06/13/2020   HDL 36 (L) 06/13/2020   LDLCALC 63 06/13/2020   TRIG 127 06/13/2020   CHOLHDL 3.4 06/13/2020   Lab Results  Component Value Date   WBC 7.7 06/13/2020   HGB 10.8 (L) 06/13/2020   HCT 33.0 (L) 06/13/2020   MCV 81 06/13/2020   PLT 356 06/13/2020   Lab Results  Component Value Date   IRON 53 01/20/2018   TIBC 317 01/20/2018   FERRITIN 61 01/20/2018   Attestation Statements:   Reviewed by clinician on day of visit: allergies, medications, problem list, medical history, surgical history, family history, social history, and previous encounter notes.   Wilhemena Durie, am acting as transcriptionist for [employer], PA-C.  I have reviewed the above documentation for accuracy and completeness, and I agree with the above. Abby Potash, PA-C

## 2020-08-11 ENCOUNTER — Other Ambulatory Visit: Payer: Self-pay

## 2020-08-14 ENCOUNTER — Encounter: Payer: Self-pay | Admitting: Neurology

## 2020-08-27 ENCOUNTER — Encounter (INDEPENDENT_AMBULATORY_CARE_PROVIDER_SITE_OTHER): Payer: Self-pay | Admitting: Physician Assistant

## 2020-08-27 ENCOUNTER — Ambulatory Visit (INDEPENDENT_AMBULATORY_CARE_PROVIDER_SITE_OTHER): Payer: 59 | Admitting: Physician Assistant

## 2020-08-27 ENCOUNTER — Other Ambulatory Visit: Payer: Self-pay

## 2020-08-27 VITALS — BP 128/84 | HR 80 | Temp 98.6°F | Ht <= 58 in | Wt 149.0 lb

## 2020-08-27 DIAGNOSIS — Z9189 Other specified personal risk factors, not elsewhere classified: Secondary | ICD-10-CM | POA: Diagnosis not present

## 2020-08-27 DIAGNOSIS — I152 Hypertension secondary to endocrine disorders: Secondary | ICD-10-CM

## 2020-08-27 DIAGNOSIS — E1159 Type 2 diabetes mellitus with other circulatory complications: Secondary | ICD-10-CM

## 2020-08-27 DIAGNOSIS — E559 Vitamin D deficiency, unspecified: Secondary | ICD-10-CM | POA: Diagnosis not present

## 2020-08-27 DIAGNOSIS — Z6831 Body mass index (BMI) 31.0-31.9, adult: Secondary | ICD-10-CM

## 2020-08-27 DIAGNOSIS — E669 Obesity, unspecified: Secondary | ICD-10-CM

## 2020-08-27 DIAGNOSIS — E66811 Obesity, class 1: Secondary | ICD-10-CM

## 2020-08-27 MED ORDER — OZEMPIC (0.25 OR 0.5 MG/DOSE) 2 MG/1.5ML ~~LOC~~ SOPN
0.2500 mg | PEN_INJECTOR | SUBCUTANEOUS | 0 refills | Status: DC
  Filled 2020-08-27 – 2020-09-02 (×4): qty 1.5, 56d supply, fill #0

## 2020-08-27 MED ORDER — VITAMIN D (ERGOCALCIFEROL) 1.25 MG (50000 UNIT) PO CAPS
ORAL_CAPSULE | ORAL | 0 refills | Status: DC
  Filled 2020-08-27: qty 4, 28d supply, fill #0

## 2020-08-27 MED FILL — Fremanezumab-vfrm Subcutaneous Soln Auto-inj 225 MG/1.5ML: SUBCUTANEOUS | 30 days supply | Qty: 1.5 | Fill #0 | Status: AC

## 2020-08-27 NOTE — Progress Notes (Deleted)
ozempic

## 2020-08-27 NOTE — Progress Notes (Signed)
Chief Complaint:   OBESITY Betty Cain is here to discuss her progress with her obesity treatment plan along with follow-up of her obesity related diagnoses. Betty Cain is on keeping a food journal and adhering to recommended goals of 1200 calories and 80 grams of protein daily and states she is following her eating plan approximately 70% of the time. Betty Cain states she is doing 0 minutes 0 times per week.  Today's visit was #: 11 Starting weight: 152 lbs Starting date: 01/10/2020 Today's weight: 149 lbs Today's date: 08/27/2020 Total lbs lost to date: 3 Total lbs lost since last in-office visit: 4  Interim History: Betty Cain did well with weight loss. She is eating more food than on the plan due to excessive hunger. She is feeling out of control with her eating.  Subjective:   1. Vitamin D deficiency Betty Cain's last Vit D level was not at goal, and she needs labs. She is on Vit D weekly.  2. Type 2 diabetes mellitus associated with hypertension ([place]) Betty Cain is on metformin, and she feels like her appetite is out of control.  3. At risk for heart disease Betty Cain is at a higher than average risk for cardiovascular disease due to obesity.   Assessment/Plan:   1. Vitamin D deficiency Low Vitamin D level contributes to fatigue and are associated with obesity, breast, and colon cancer. We will refill prescription Vitamin D for 1 month. Betty Cain will follow-up for routine testing of Vitamin D, at least 2-3 times per year to avoid over-replacement.  - Vitamin D, Ergocalciferol, (DRISDOL) 1.25 MG (50000 UNIT) CAPS capsule; TAKE 1 CAPSULE (50,000 UNITS TOTAL) BY MOUTH EVERY 7 (SEVEN) DAYS.  Dispense: 4 capsule; Refill: 0  2. Type 2 diabetes mellitus associated with hypertension ([place]) Betty Cain agreed to add Ozempic 0.25 mg q weekly with no refills. Good blood sugar control is important to decrease the likelihood of diabetic complications such as nephropathy, neuropathy, limb loss, blindness, coronary artery disease, and  death. Intensive lifestyle modification including diet, exercise and weight loss are the first line of treatment for diabetes.   - Semaglutide,0.25 or 0.5MG /DOS, (OZEMPIC, 0.25 OR 0.5 MG/DOSE,) 2 MG/1.5ML SOPN; Inject 0.25 mg into the skin once a week.  Dispense: 1.5 mL; Refill: 0  3. At risk for heart disease Betty Cain was given approximately 15 minutes of coronary artery disease prevention counseling today. She is 45 y.o. female and has risk factors for heart disease including obesity. We discussed intensive lifestyle modifications today with an emphasis on specific weight loss instructions and strategies.   Repetitive spaced learning was employed today to elicit superior memory formation and behavioral change.  4. Class 1 obesity with serious comorbidity and body mass index (BMI) of 31.0 to 31.9 in adult, unspecified obesity type Betty Cain is currently in the action stage of change. As such, her goal is to continue with weight loss efforts. She has agreed to keeping a food journal and adhering to recommended goals of 1200 calories and 80 grams of protein daily.   We will recheck her IC, and recheck her Vit D level at her next visit.  Exercise goals: No exercise has been prescribed at this time.  Behavioral modification strategies: meal planning and cooking strategies and keeping healthy foods in the home.  Betty Cain has agreed to follow-up with our clinic in 3 weeks. She was informed of the importance of frequent follow-up visits to maximize her success with intensive lifestyle modifications for her multiple health conditions.   Objective:  Blood pressure 128/84, pulse 80, temperature 98.6 F (37 C), temperature source Oral, height 4\' 10"  (1.473 m), weight 149 lb (67.6 kg), SpO2 99 %. Body mass index is 31.14 kg/m.  General: Cooperative, alert, well developed, in no acute distress. HEENT: Conjunctivae and lids unremarkable. Cardiovascular: Regular rhythm.  Lungs: Normal work of  breathing. Neurologic: No focal deficits.   Lab Results  Component Value Date   CREATININE 0.55 (L) 06/13/2020   BUN 14 06/13/2020   NA 142 06/13/2020   K 4.3 06/13/2020   CL 102 06/13/2020   CO2 21 05/21/2019   Lab Results  Component Value Date   ALT 14 05/21/2019   AST 14 06/13/2020   ALKPHOS 91 06/13/2020   BILITOT 0.2 06/13/2020   Lab Results  Component Value Date   HGBA1C 5.7 (A) 06/13/2020   HGBA1C 7.5 (A) 01/02/2020   HGBA1C 7.5 01/02/2020   HGBA1C 6.8 (A) 08/20/2019   HGBA1C 6.8 08/20/2019   HGBA1C 6.8 (A) 08/20/2019   HGBA1C 6.8 08/20/2019   Lab Results  Component Value Date   INSULIN 8.7 01/10/2020   Lab Results  Component Value Date   TSH 2.040 06/13/2020   Lab Results  Component Value Date   CHOL 122 06/13/2020   HDL 36 (L) 06/13/2020   LDLCALC 63 06/13/2020   TRIG 127 06/13/2020   CHOLHDL 3.4 06/13/2020   Lab Results  Component Value Date   WBC 7.7 06/13/2020   HGB 10.8 (L) 06/13/2020   HCT 33.0 (L) 06/13/2020   MCV 81 06/13/2020   PLT 356 06/13/2020   Lab Results  Component Value Date   IRON 53 01/20/2018   TIBC 317 01/20/2018   FERRITIN 61 01/20/2018   Attestation Statements:   Reviewed by clinician on day of visit: allergies, medications, problem list, medical history, surgical history, family history, social history, and previous encounter notes.   Wilhemena Durie, am acting as transcriptionist for [employer], PA-C.  I have reviewed the above documentation for accuracy and completeness, and I agree with the above. Abby Potash, PA-C

## 2020-08-28 ENCOUNTER — Other Ambulatory Visit: Payer: Self-pay | Admitting: Neurology

## 2020-08-28 ENCOUNTER — Other Ambulatory Visit: Payer: Self-pay

## 2020-08-28 MED ORDER — TIZANIDINE HCL 2 MG PO TABS
ORAL_TABLET | ORAL | 1 refills | Status: DC
Start: 2020-08-28 — End: 2020-12-26
  Filled 2020-08-28: qty 20, 7d supply, fill #0
  Filled 2020-10-17: qty 20, 7d supply, fill #1

## 2020-08-28 MED FILL — Metoprolol Succinate Tab ER 24HR 100 MG (Tartrate Equiv): ORAL | 90 days supply | Qty: 90 | Fill #0 | Status: AC

## 2020-08-28 MED FILL — Rosuvastatin Calcium Tab 5 MG: ORAL | 30 days supply | Qty: 60 | Fill #0 | Status: AC

## 2020-08-28 MED FILL — Levocetirizine Dihydrochloride Tab 5 MG: ORAL | 30 days supply | Qty: 30 | Fill #1 | Status: AC

## 2020-08-28 MED FILL — Meloxicam Tab 15 MG: ORAL | 30 days supply | Qty: 30 | Fill #1 | Status: AC

## 2020-08-29 ENCOUNTER — Encounter (INDEPENDENT_AMBULATORY_CARE_PROVIDER_SITE_OTHER): Payer: Self-pay | Admitting: Physician Assistant

## 2020-08-29 ENCOUNTER — Other Ambulatory Visit: Payer: Self-pay

## 2020-09-02 ENCOUNTER — Other Ambulatory Visit: Payer: Self-pay

## 2020-09-02 NOTE — Telephone Encounter (Signed)
Please review

## 2020-09-02 NOTE — Telephone Encounter (Signed)
Did we complete a pa on ozempic? Thanks

## 2020-09-03 ENCOUNTER — Other Ambulatory Visit: Payer: Self-pay

## 2020-09-04 ENCOUNTER — Other Ambulatory Visit: Payer: Self-pay

## 2020-09-10 ENCOUNTER — Other Ambulatory Visit: Payer: Self-pay

## 2020-09-10 MED FILL — Norgestimate & Ethinyl Estradiol Tab 0.25 MG-35 MCG: ORAL | 28 days supply | Qty: 28 | Fill #1 | Status: AC

## 2020-09-12 ENCOUNTER — Other Ambulatory Visit: Payer: Self-pay

## 2020-09-28 ENCOUNTER — Encounter (INDEPENDENT_AMBULATORY_CARE_PROVIDER_SITE_OTHER): Payer: Self-pay

## 2020-09-29 ENCOUNTER — Ambulatory Visit (INDEPENDENT_AMBULATORY_CARE_PROVIDER_SITE_OTHER): Payer: 59 | Admitting: Family Medicine

## 2020-09-29 ENCOUNTER — Other Ambulatory Visit: Payer: Self-pay

## 2020-09-29 ENCOUNTER — Encounter (INDEPENDENT_AMBULATORY_CARE_PROVIDER_SITE_OTHER): Payer: Self-pay | Admitting: Family Medicine

## 2020-09-29 VITALS — BP 114/74 | HR 102 | Temp 98.5°F | Ht <= 58 in | Wt 148.0 lb

## 2020-09-29 DIAGNOSIS — E559 Vitamin D deficiency, unspecified: Secondary | ICD-10-CM

## 2020-09-29 DIAGNOSIS — E669 Obesity, unspecified: Secondary | ICD-10-CM

## 2020-09-29 DIAGNOSIS — Z9189 Other specified personal risk factors, not elsewhere classified: Secondary | ICD-10-CM

## 2020-09-29 DIAGNOSIS — I152 Hypertension secondary to endocrine disorders: Secondary | ICD-10-CM

## 2020-09-29 DIAGNOSIS — R0602 Shortness of breath: Secondary | ICD-10-CM | POA: Diagnosis not present

## 2020-09-29 DIAGNOSIS — E1159 Type 2 diabetes mellitus with other circulatory complications: Secondary | ICD-10-CM

## 2020-09-29 DIAGNOSIS — Z6831 Body mass index (BMI) 31.0-31.9, adult: Secondary | ICD-10-CM

## 2020-09-29 MED ORDER — VITAMIN D (ERGOCALCIFEROL) 1.25 MG (50000 UNIT) PO CAPS
ORAL_CAPSULE | ORAL | 0 refills | Status: DC
  Filled 2020-09-29 – 2020-10-06 (×2): qty 4, 28d supply, fill #0

## 2020-09-29 MED ORDER — OZEMPIC (0.25 OR 0.5 MG/DOSE) 2 MG/1.5ML ~~LOC~~ SOPN
0.5000 mg | PEN_INJECTOR | SUBCUTANEOUS | 0 refills | Status: DC
  Filled 2020-09-29 – 2020-10-16 (×6): qty 1.5, 28d supply, fill #0

## 2020-09-30 LAB — HEMOGLOBIN A1C
Est. average glucose Bld gHb Est-mCnc: 146 mg/dL
Hgb A1c MFr Bld: 6.7 % — ABNORMAL HIGH (ref 4.8–5.6)

## 2020-09-30 LAB — VITAMIN D 25 HYDROXY (VIT D DEFICIENCY, FRACTURES): Vit D, 25-Hydroxy: 42.6 ng/mL (ref 30.0–100.0)

## 2020-10-01 ENCOUNTER — Ambulatory Visit (INDEPENDENT_AMBULATORY_CARE_PROVIDER_SITE_OTHER): Payer: 59 | Admitting: Nurse Practitioner

## 2020-10-01 ENCOUNTER — Encounter: Payer: Self-pay | Admitting: Nurse Practitioner

## 2020-10-01 ENCOUNTER — Other Ambulatory Visit: Payer: Self-pay

## 2020-10-01 VITALS — BP 123/78 | HR 95 | Temp 97.2°F | Ht <= 58 in | Wt 151.1 lb

## 2020-10-01 DIAGNOSIS — I471 Supraventricular tachycardia, unspecified: Secondary | ICD-10-CM | POA: Insufficient documentation

## 2020-10-01 DIAGNOSIS — N898 Other specified noninflammatory disorders of vagina: Secondary | ICD-10-CM

## 2020-10-01 DIAGNOSIS — R102 Pelvic and perineal pain: Secondary | ICD-10-CM

## 2020-10-01 DIAGNOSIS — E669 Obesity, unspecified: Secondary | ICD-10-CM | POA: Insufficient documentation

## 2020-10-01 DIAGNOSIS — Z6831 Body mass index (BMI) 31.0-31.9, adult: Secondary | ICD-10-CM | POA: Insufficient documentation

## 2020-10-01 NOTE — Progress Notes (Signed)
[name],   [id number] Phone:  [phone]   Fax:  [phone]   Established Patient Office Visit  Subjective:  Patient ID: Betty Cain, female    DOB: 12/24/1975  Age: 45 y.o. MRN: [id number]  CC:  Chief Complaint  Patient presents with   Follow-up    Pain in low abdominal pain going to right side into low back. Has been feeling fatigue and tired.     HPI Betty Cain presents for follow up. She  has a past medical history of Allergy, Anemia, Anxiety (01/2019), Asthma, B12 deficiency, Back pain, Common migraine with intractable migraine (06/28/2017), Constipation, Diabetes ([place]) (02/2019), Fatigue, GERD (gastroesophageal reflux disease), Hypertension, IBS (irritable bowel syndrome) 07-06-03), Joint pain, Neonatal death, Palpitations, Shortness of breath, Shortness of breath on exertion, Spinal headache, Swelling of both lower extremities, Valvular heart disease, and Vitamin D deficiency (02/2019).   She is having pelvic pain. She feels like the pain is worse. She is concern that she the pain started after Nexplanon removal. Records indicate that she has been evaluated for pelvic pain in 07/05/2017. LMP  08/16/20 This was the one and only since Nexplanon removal. Vaginitis Patient presents for evaluation of an abnormal vaginal discharge. Symptoms have been present for several weeks. Vaginal symptoms: burning and vulvar itching. Contraception: none. She denies abnormal bleeding. Sexually transmitted infection risk: very low risk of STD exposure. Menstrual flow: rare.  She has stress she denies any depression.  She has generalized complaints  She has DM type 2. Her current A1c is normal 6.7 % She continues with weight management. She reports not being able to exercise. Her weight based on our records is slightly elevated.   Past Medical History:  Diagnosis Date   Allergy    Anemia    Anxiety 01/2019   Asthma    B12 deficiency    Back pain     Common migraine with intractable migraine 06/28/2017   Constipation    Diabetes ([place]) 02/2019   Fatigue    GERD (gastroesophageal reflux disease)    pos H pylori   Hypertension    controlled with medication   IBS (irritable bowel syndrome) 2003-07-06   Joint pain    Neonatal death    Vaginal delivery, full term-lived x2 hours.    Palpitations    Shortness of breath    Shortness of breath on exertion    Spinal headache    Swelling of both lower extremities    Valvular heart disease    Vitamin D deficiency 02/2019    Past Surgical History:  Procedure Laterality Date   ADENOIDECTOMY     and tonsils (as a child)   boil  07-05-01   right elbow   CESAREAN SECTION     x4   CESAREAN SECTION  06/02/2011   Procedure: CESAREAN SECTION;  Surgeon: [name], MD;  Location: [facility];  Service: Gynecology;  Laterality: N/A;  Primary Cesarean Section Delivery Baby Boy @ 0004, Apgars 9/9   CESAREAN SECTION N/A 12/10/2013   Procedure: REPEAT CESAREAN SECTION;  Surgeon: [name], MD;  Location: [facility];  Service: Obstetrics;  Laterality: N/A;   CESAREAN SECTION     colonoscopy  11/23/2018   stated had issues with sleep when in for procedure   OTHER SURGICAL HISTORY  06-Jul-2003   Uterine surgery    uterine cauterization      Family History  Problem Relation Age of Onset  Diabetes Mother    Diabetes Father    Diabetes Sister    Anesthesia problems Neg Hx    Colon cancer Neg Hx    Esophageal cancer Neg Hx    Rectal cancer Neg Hx    Stomach cancer Neg Hx    Colon polyps Neg Hx     Social History   Socioeconomic History   Marital status: Married    Spouse name: [name]   Number of children: 4   Years of education: Not on file   Highest education level: Not on file  Occupational History   Occupation: Unemployed  Tobacco Use   Smoking status: Never   Smokeless tobacco: Never  Vaping Use   Vaping Use: Never used  Substance and Sexual Activity   Alcohol use: No   Drug use: No    Sexual activity: Yes    Birth control/protection: None, Implant    Comment: pregnant  Other Topics Concern   Not on file  Social History Narrative   Lives with husband and child   Caffeine use: daily (tea), sometimes coffee/soda   Right handed    Social Determinants of Health   Financial Resource Strain: Not on file  Food Insecurity: No Food Insecurity   Worried About Charity fundraiser in the Last Year: Never true   Ran Out of Food in the Last Year: Never true  Transportation Needs: Not on file  Physical Activity: Not on file  Stress: Not on file  Social Connections: Not on file  Intimate Partner Violence: Not on file    Outpatient Medications Prior to Visit  Medication Sig Dispense Refill   acetaminophen (TYLENOL) 500 MG tablet Take 500 mg by mouth every 6 (six) hours as needed.     albuterol (VENTOLIN HFA) 108 (90 Base) MCG/ACT inhaler Inhale 2 puffs into the lungs every 6 (six) hours as needed for wheezing or shortness of breath. 8 g 2   Blood Glucose Monitoring Suppl (ONETOUCH VERIO IQ SYSTEM) w/Device KIT 1 application by Does not apply route 4 (four) times daily. 1 kit 0   Blood Glucose Monitoring Suppl (ONETOUCH VERIO REFLECT) w/Device KIT USE AS DIRECTED FOUR TIMES DAILY 1 kit 0   Blood Pressure Monitoring DEVI 1 each by Does not apply route daily. 1 Device 0   ferrous sulfate (FEROSUL) 325 (65 FE) MG tablet TAKE 1 TABLET (325 MG TOTAL) BY MOUTH DAILY. 90 tablet 3   fluticasone (FLONASE) 50 MCG/ACT nasal spray PLACE 2 SPRAYS INTO BOTH NOSTRILS DAILY. 16 g 11   fluticasone furoate-vilanterol (BREO ELLIPTA) 100-25 MCG/INH AEPB INHALE 1 PUFF INTO THE LUNGS DAILY. 60 each 3   Fremanezumab-vfrm 225 MG/1.5ML SOAJ INJECT 225 MG INTO THE SKIN EVERY 30 (THIRTY) DAYS. 1.5 mL 11   gabapentin (NEURONTIN) 300 MG capsule TAKE 1 CAPSULE BY MOUTH IN THE MORNING AND 2 CAPSULES IN THE EVENING. 270 capsule 3   glucose blood test strip USE AS INSTRUCTED 100 strip 0   Lancets (ONETOUCH  DELICA PLUS [id number]) MISC USE AS DIRECTED IN THE MORNING, NOON, EVENING, AND BEDTIME 100 each 11   levocetirizine (XYZAL) 5 MG tablet TAKE 1 TABLET (5 MG TOTAL) BY MOUTH EVERY EVENING. 90 tablet 3   meloxicam (MOBIC) 15 MG tablet TAKE 1 TABLET (15 MG TOTAL) BY MOUTH DAILY. 30 tablet 11   metFORMIN (GLUCOPHAGE-XR) 500 MG 24 hr tablet TAKE 1 TABLET (500 MG TOTAL) BY MOUTH 2 (TWO) TIMES DAILY. 180 tablet 3   methylcellulose (CITRUCEL) oral powder  Take as directed, daily     metoprolol succinate (TOPROL-XL) 100 MG 24 hr tablet TAKE 1 TABLET (100 MG TOTAL) BY MOUTH DAILY. TAKE WITH OR IMMEDIATELY FOLLOWING A MEAL. 90 tablet 3   Multiple Vitamin (MULTI VITAMIN DAILY PO) Take by mouth.     Multiple Vitamins-Minerals (ALIVE WOMENS ENERGY) TABS Take 1 tablet by mouth daily.     norgestimate-ethinyl estradiol (ORTHO-CYCLEN) 0.25-35 MG-MCG tablet Take 1 tablet by mouth daily. Take 1 daily     ondansetron (ZOFRAN-ODT) 8 MG disintegrating tablet TAKE 1 TABLET BY MOUTH EVERY 8 HOURS AS NEEDED FOR NAUSEA OR VOMITING. 30 tablet 0   OneTouch Delica Lancets 17B MISC 1 application by Does not apply route in the morning, at noon, in the evening, and at bedtime. 100 each 11   pantoprazole (PROTONIX) 40 MG tablet TAKE 1 TABLET BY MOUTH 2 TIMES DAILY BEFORE A MEAL. 180 tablet 1   potassium chloride 20 MEQ/15ML (10%) SOLN TAKE 15 MLS (20 MEQ TOTAL) BY MOUTH DAILY. 473 mL 2   Rimegepant Sulfate 75 MG TBDP TAKE 1 TABLET AT THE ONSET OF MIGRAINE. 30 tablet 1   rosuvastatin (CRESTOR) 5 MG tablet TAKE 1 TABLET (5 MG TOTAL) BY MOUTH AT BEDTIME. 60 tablet 11   Semaglutide,0.25 or 0.5MG/DOS, (OZEMPIC, 0.25 OR 0.5 MG/DOSE,) 2 MG/1.5ML SOPN Inject 0.5 mg into the skin once a week. 1.5 mL 0   sucralfate (CARAFATE) 1 GM/10ML suspension TAKE 10 MLS (1 G TOTAL) BY MOUTH EVERY 6 (SIX) HOURS AS NEEDED. 420 mL 2   tiZANidine (ZANAFLEX) 2 MG tablet TAKE 1 TABLET (2 MG TOTAL) BY MOUTH EVERY 8 (EIGHT) HOURS AS NEEDED (AS NEEDED FOR ACUTE  HEADACHE). 20 tablet 1   vitamin B-12 (CYANOCOBALAMIN) 1000 MCG tablet Take 5 tablets (5,000 mcg total) by mouth daily. 450 tablet 3   Vitamin D, Ergocalciferol, (DRISDOL) 1.25 MG (50000 UNIT) CAPS capsule TAKE 1 CAPSULE (50,000 UNITS TOTAL) BY MOUTH EVERY 7 (SEVEN) DAYS. 4 capsule 0   nortriptyline (PAMELOR) 25 MG capsule Take 1 capsule (25 mg total) by mouth at bedtime. (Patient not taking: Reported on 10/01/2020) 90 capsule 3   norgestimate-ethinyl estradiol (ORTHO-CYCLEN) 0.25-35 MG-MCG tablet TAKE 1 TABLET BY MOUTH DAILY. 30 tablet 11   No facility-administered medications prior to visit.    Allergies  Allergen Reactions   Topamax [Topiramate] Itching and Rash    ROS Review of Systems  Respiratory:  Positive for shortness of breath (on and off).   Cardiovascular:  Positive for chest pain (on and off).     Objective:    Physical Exam Constitutional:      General: She is not in acute distress.    Appearance: Normal appearance. She is not ill-appearing, toxic-appearing or diaphoretic.  HENT:     Head: Normocephalic and atraumatic.     Right Ear: Tympanic membrane normal.     Left Ear: Tympanic membrane normal.  Cardiovascular:     Rate and Rhythm: Normal rate and regular rhythm.     Pulses: Normal pulses.     Heart sounds: Normal heart sounds.  Pulmonary:     Effort: Pulmonary effort is normal.     Breath sounds: Normal breath sounds.  Abdominal:     Palpations: Abdomen is soft.  Musculoskeletal:        General: Normal range of motion.     Cervical back: Normal range of motion.  Skin:    General: Skin is warm and dry.     Capillary Refill:  Capillary refill takes less than 2 seconds.  Neurological:     General: No focal deficit present.     Mental Status: She is alert.      BP 123/78 (BP Location: Left Arm, Patient Position: Sitting)   Pulse 95   Temp (!) 97.2 F (36.2 C)   Ht 4' 10"  (1.473 m)   Wt 151 lb 0.8 oz (68.5 kg)   SpO2 100%   BMI 31.57 kg/m  Wt  Readings from Last 3 Encounters:  10/01/20 151 lb 0.8 oz (68.5 kg)  09/29/20 148 lb (67.1 kg)  08/27/20 149 lb (67.6 kg)     Health Maintenance Due  Topic Date Due   PNEUMOCOCCAL POLYSACCHARIDE VACCINE AGE 43-64 HIGH RISK  Never done   OPHTHALMOLOGY EXAM  Never done   Hepatitis C Screening  Never done   PAP SMEAR-Modifier  06/19/2016   COVID-19 Vaccine (3 - Booster for Pfizer series) 12/28/2019    There are no preventive care reminders to display for this patient.  Lab Results  Component Value Date   TSH 2.040 06/13/2020   Lab Results  Component Value Date   WBC 7.7 06/13/2020   HGB 10.8 (L) 06/13/2020   HCT 33.0 (L) 06/13/2020   MCV 81 06/13/2020   PLT 356 06/13/2020   Lab Results  Component Value Date   NA 142 06/13/2020   K 4.3 06/13/2020   CO2 21 05/21/2019   GLUCOSE 76 06/13/2020   BUN 14 06/13/2020   CREATININE 0.55 (L) 06/13/2020   BILITOT 0.2 06/13/2020   ALKPHOS 91 06/13/2020   AST 14 06/13/2020   ALT 14 05/21/2019   PROT 7.5 06/13/2020   ALBUMIN 4.5 06/13/2020   CALCIUM 9.6 06/13/2020   ANIONGAP 11 08/30/2018   EGFR 117 06/13/2020   GFR 119.42 08/28/2018   Lab Results  Component Value Date   CHOL 122 06/13/2020   Lab Results  Component Value Date   HDL 36 (L) 06/13/2020   Lab Results  Component Value Date   LDLCALC 63 06/13/2020   Lab Results  Component Value Date   TRIG 127 06/13/2020   Lab Results  Component Value Date   CHOLHDL 3.4 06/13/2020   Lab Results  Component Value Date   HGBA1C 6.7 (H) 09/29/2020      Assessment & Plan:   Problem List Items Addressed This Visit       Cardiovascular and Mediastinum   Paroxysmal supraventricular tachycardia ([place]) Stable on current regimen continue to follow-up with cardiology     Other   Morbid obesity ([place]) Stable continue to follow-up with weight management   Other Visit Diagnoses     Vaginal discharge    -  Primary Persistent labs pending   Relevant Orders   NuSwab  Vaginitis ([facility])   Urinalysis Dipstick   Pelvic pain     Persistent referral for further evaluation previous ultrasound negative   Relevant Orders   Ambulatory referral to Obstetrics / Gynecology   Urinalysis Dipstick       No orders of the defined types were placed in this encounter.   Follow-up: No follow-ups on file.    Vevelyn Francois, NP

## 2020-10-02 ENCOUNTER — Encounter: Payer: Self-pay | Admitting: Nurse Practitioner

## 2020-10-05 LAB — NUSWAB VAGINITIS (VG)
Candida albicans, NAA: NEGATIVE
Candida glabrata, NAA: NEGATIVE
Trich vag by NAA: NEGATIVE

## 2020-10-06 ENCOUNTER — Other Ambulatory Visit: Payer: Self-pay

## 2020-10-06 MED FILL — Levocetirizine Dihydrochloride Tab 5 MG: ORAL | 30 days supply | Qty: 30 | Fill #2 | Status: AC

## 2020-10-06 MED FILL — Meloxicam Tab 15 MG: ORAL | 30 days supply | Qty: 30 | Fill #2 | Status: AC

## 2020-10-06 NOTE — Progress Notes (Signed)
Chief Complaint:   OBESITY Betty Cain is here to discuss her progress with her obesity treatment plan along with follow-up of her obesity related diagnoses.   Today's visit was #: 12 Starting weight: 152 lbs Starting date: 01/10/2020 Today's weight: 148 lbs Today's date: 09/29/2020 Weight change since last visit: 1 lb Total lbs lost to date: 4 lbs Body mass index is 30.93 kg/m.  Total weight loss percentage to date: -2.63%  Interim History:  Clairissa was last seen by Abby Potash, PA-C, and was started on Ozempic at her last office visit.  She is not journaling because she states it is not free.  She is following the Category 2 meal plan.  Current Meal Plan: keeping a food journal and adhering to recommended goals of 1200 calories and 80 grams of protein for 0% of the time.  Current Exercise Plan: None. Current Anti-Obesity Medications: Ozempic 0.25 mg subcutaneously weekly. Side effects: None.  Assessment/Plan:   Orders Placed This Encounter  Procedures   VITAMIN D 25 Hydroxy (Vit-D Deficiency, Fractures)   Hemoglobin A1c   Medications Discontinued During This Encounter  Medication Reason   Semaglutide,0.25 or 0.5MG /DOS, (OZEMPIC, 0.25 OR 0.5 MG/DOSE,) 2 MG/1.5ML SOPN Reorder   Vitamin D, Ergocalciferol, (DRISDOL) 1.25 MG (50000 UNIT) CAPS capsule Reorder   Meds ordered this encounter  Medications   Semaglutide,0.25 or 0.5MG /DOS, (OZEMPIC, 0.25 OR 0.5 MG/DOSE,) 2 MG/1.5ML SOPN    Sig: Inject 0.5 mg into the skin once a week.    Dispense:  1.5 mL    Refill:  0   Vitamin D, Ergocalciferol, (DRISDOL) 1.25 MG (50000 UNIT) CAPS capsule    Sig: TAKE 1 CAPSULE (50,000 UNITS TOTAL) BY MOUTH EVERY 7 (SEVEN) DAYS.    Dispense:  4 capsule    Refill:  0    1. Type 2 diabetes mellitus associated with hypertension (HCC) Diabetes Mellitus: Not at goal. Medication: metformin XR 500 mg twice daily, Ozempic 0.25 mg subcutaneously weekly. Issues reviewed: blood sugar goals, complications of  diabetes mellitus, hypoglycemia prevention and treatment, exercise, and nutrition.  Added Ozempic at last office visit with Abby Potash, PA-C.  Tolerating well but if she eats too many sweets, she feels sick.  Helping with hunger a lot, per patient.  Plan:  Increase Ozempic to 0.5 mg weekly.  New script given.  Continue all other mediations per PCP for diabetes.  Increase protein, decrease simple carbs, and follow prudent nutritional plan.  Will check A1c today. The importance of regular follow up with PCP and all other specialists as scheduled was stressed to patient today. The patient will continue to focus on protein-rich, low simple carbohydrate foods. We reviewed the importance of hydration, regular exercise for stress reduction, and restorative sleep.   Lab Results  Component Value Date   HGBA1C 6.7 (H) 09/29/2020   HGBA1C 5.7 (A) 06/13/2020   HGBA1C 7.5 (A) 01/02/2020   HGBA1C 7.5 01/02/2020   Lab Results  Component Value Date   LDLCALC 63 06/13/2020   CREATININE 0.55 (L) 06/13/2020   - Increase and refill semaglutide,0.25 or 0.5MG /DOS, (OZEMPIC, 0.25 OR 0.5 MG/DOSE,) 2 MG/1.5ML SOPN; Inject 0.5 mg into the skin once a week.  Dispense: 1.5 mL; Refill: 0 - Hemoglobin A1c  2. Vitamin D deficiency Not at goal. Current vitamin D is 22.9, tested on 01/10/2020. Optimal goal > 50 ng/dL.   Plan: Continue to take prescription Vitamin D @50 ,000 IU every week as prescribed.  Will check vitamin D level today.  -  Refill Vitamin D, Ergocalciferol, (DRISDOL) 1.25 MG (50000 UNIT) CAPS capsule; TAKE 1 CAPSULE (50,000 UNITS TOTAL) BY MOUTH EVERY 7 (SEVEN) DAYS.  Dispense: 4 capsule; Refill: 0 - VITAMIN D 25 Hydroxy (Vit-D Deficiency, Fractures)  3. SOB (shortness of breath) on exertion New RMR was performed today and is 1400.  4. At risk for deficient intake of food Aracelie was given extensive education and counseling today of more than 22 minutes on risks associated with deficient food intake.   Counseled her on the importance of following our prescribed meal plan and eating adequate amounts of protein.  Discussed with Caitlyn Horton that inadequate food intake over longer periods of time can slow their metabolism down significantly.   5. Class 1 obesity with serious comorbidity and body mass index (BMI) of 31.0 to 31.9 in adult, unspecified obesity type  Course: Jenese is currently in the action stage of change. As such, her goal is to continue with weight loss efforts.   Nutrition goals: She has agreed to change to the Category 1 Plan from Category 2 based on new RMR of 1400.   Exercise goals: All adults should avoid inactivity. Some physical activity is better than none, and adults who participate in any amount of physical activity gain some health benefits.  Start walking for 5-10 minutes per day.  Behavioral modification strategies: planning for success.  Colbi has agreed to follow-up with our clinic in 2-3 weeks with App to go over vitamin D and A1c.  Also increased Ozempic today. She was informed of the importance of frequent follow-up visits to maximize her success with intensive lifestyle modifications for her multiple health conditions.   Mouna was informed we would discuss her lab results at her next visit unless there is a critical issue that needs to be addressed sooner. Antwan agreed to keep her next visit at the agreed upon time to discuss these results.  Objective:   Blood pressure 114/74, pulse (!) 102, temperature 98.5 F (36.9 C), height 4\' 10"  (1.473 m), weight 148 lb (67.1 kg), SpO2 99 %. Body mass index is 30.93 kg/m.  General: Cooperative, alert, well developed, in no acute distress. HEENT: Conjunctivae and lids unremarkable. Cardiovascular: Regular rhythm.  Lungs: Normal work of breathing. Neurologic: No focal deficits.   Lab Results  Component Value Date   CREATININE 0.55 (L) 06/13/2020   BUN 14 06/13/2020   NA 142 06/13/2020   K 4.3 06/13/2020   CL 102  06/13/2020   CO2 21 05/21/2019   Lab Results  Component Value Date   ALT 14 05/21/2019   AST 14 06/13/2020   ALKPHOS 91 06/13/2020   BILITOT 0.2 06/13/2020   Lab Results  Component Value Date   HGBA1C 6.7 (H) 09/29/2020   HGBA1C 5.7 (A) 06/13/2020   HGBA1C 7.5 (A) 01/02/2020   HGBA1C 7.5 01/02/2020   HGBA1C 6.8 (A) 08/20/2019   HGBA1C 6.8 08/20/2019   HGBA1C 6.8 (A) 08/20/2019   HGBA1C 6.8 08/20/2019   Lab Results  Component Value Date   INSULIN 8.7 01/10/2020   Lab Results  Component Value Date   TSH 2.040 06/13/2020   Lab Results  Component Value Date   CHOL 122 06/13/2020   HDL 36 (L) 06/13/2020   LDLCALC 63 06/13/2020   TRIG 127 06/13/2020   CHOLHDL 3.4 06/13/2020   Lab Results  Component Value Date   WBC 7.7 06/13/2020   HGB 10.8 (L) 06/13/2020   HCT 33.0 (L) 06/13/2020   MCV 81 06/13/2020  PLT 356 06/13/2020   Lab Results  Component Value Date   IRON 53 01/20/2018   TIBC 317 01/20/2018   FERRITIN 61 01/20/2018   Attestation Statements:   Reviewed by clinician on day of visit: allergies, medications, problem list, medical history, surgical history, family history, social history, and previous encounter notes.  I, Water quality scientist, CMA, am acting as Location manager for [employer], DO.  I have reviewed the above documentation for accuracy and completeness, and I agree with the above. Marjory Sneddon, D.O.  The [place] was signed into law in 2016 which includes the topic of electronic health records.  This provides immediate access to information in MyChart.  This includes consultation notes, operative notes, office notes, lab results and pathology reports.  If you have any questions about what you read please let us know at your next visit so we can discuss your concerns and take corrective action if need be.  We are right here with you.

## 2020-10-08 ENCOUNTER — Other Ambulatory Visit: Payer: Self-pay

## 2020-10-09 ENCOUNTER — Other Ambulatory Visit: Payer: Self-pay

## 2020-10-14 ENCOUNTER — Other Ambulatory Visit: Payer: Self-pay

## 2020-10-15 ENCOUNTER — Other Ambulatory Visit: Payer: Self-pay

## 2020-10-15 MED FILL — Pantoprazole Sodium EC Tab 40 MG (Base Equiv): ORAL | 90 days supply | Qty: 180 | Fill #0 | Status: AC

## 2020-10-16 ENCOUNTER — Other Ambulatory Visit: Payer: Self-pay

## 2020-10-17 ENCOUNTER — Other Ambulatory Visit: Payer: Self-pay

## 2020-10-17 ENCOUNTER — Other Ambulatory Visit: Payer: Self-pay | Admitting: Nurse Practitioner

## 2020-10-17 DIAGNOSIS — R000 Tachycardia, unspecified: Secondary | ICD-10-CM

## 2020-10-17 MED ORDER — METOPROLOL SUCCINATE ER 100 MG PO TB24
ORAL_TABLET | Freq: Every day | ORAL | 3 refills | Status: DC
Start: 2020-10-17 — End: 2020-10-20
  Filled 2020-10-17: qty 90, fill #0

## 2020-10-17 MED FILL — Fremanezumab-vfrm Subcutaneous Soln Auto-inj 225 MG/1.5ML: SUBCUTANEOUS | 30 days supply | Qty: 1.5 | Fill #1 | Status: CN

## 2020-10-20 ENCOUNTER — Other Ambulatory Visit: Payer: Self-pay

## 2020-10-20 ENCOUNTER — Encounter (INDEPENDENT_AMBULATORY_CARE_PROVIDER_SITE_OTHER): Payer: Self-pay | Admitting: Family Medicine

## 2020-10-20 ENCOUNTER — Ambulatory Visit (INDEPENDENT_AMBULATORY_CARE_PROVIDER_SITE_OTHER): Payer: 59 | Admitting: Family Medicine

## 2020-10-20 ENCOUNTER — Telehealth: Payer: Self-pay | Admitting: Neurology

## 2020-10-20 VITALS — BP 111/74 | HR 80 | Temp 98.2°F | Ht <= 58 in | Wt 148.0 lb

## 2020-10-20 DIAGNOSIS — E559 Vitamin D deficiency, unspecified: Secondary | ICD-10-CM

## 2020-10-20 DIAGNOSIS — E669 Obesity, unspecified: Secondary | ICD-10-CM

## 2020-10-20 DIAGNOSIS — E1140 Type 2 diabetes mellitus with diabetic neuropathy, unspecified: Secondary | ICD-10-CM

## 2020-10-20 DIAGNOSIS — R000 Tachycardia, unspecified: Secondary | ICD-10-CM

## 2020-10-20 DIAGNOSIS — E1159 Type 2 diabetes mellitus with other circulatory complications: Secondary | ICD-10-CM

## 2020-10-20 DIAGNOSIS — Z6831 Body mass index (BMI) 31.0-31.9, adult: Secondary | ICD-10-CM

## 2020-10-20 MED ORDER — VITAMIN D (ERGOCALCIFEROL) 1.25 MG (50000 UNIT) PO CAPS
ORAL_CAPSULE | ORAL | 0 refills | Status: DC
  Filled 2020-10-20: qty 12, fill #0
  Filled 2020-10-20: qty 4, 28d supply, fill #0

## 2020-10-20 MED ORDER — METOPROLOL SUCCINATE ER 100 MG PO TB24
100.0000 mg | ORAL_TABLET | Freq: Every day | ORAL | 0 refills | Status: DC
  Filled 2020-10-20: qty 90, 90d supply, fill #0
  Filled 2020-10-20: qty 30, 30d supply, fill #0
  Filled 2020-12-26: qty 30, 30d supply, fill #1
  Filled 2021-02-03: qty 30, 30d supply, fill #2

## 2020-10-20 MED ORDER — OZEMPIC (0.25 OR 0.5 MG/DOSE) 2 MG/1.5ML ~~LOC~~ SOPN
0.5000 mg | PEN_INJECTOR | SUBCUTANEOUS | 0 refills | Status: DC
  Filled 2020-10-20 (×2): qty 4.5, 84d supply, fill #0

## 2020-10-20 MED ORDER — METFORMIN HCL ER 500 MG PO TB24
ORAL_TABLET | Freq: Two times a day (BID) | ORAL | 0 refills | Status: DC
Start: 2020-10-20 — End: 2020-12-26
  Filled 2020-10-20 (×2): qty 180, 90d supply, fill #0

## 2020-10-20 MED FILL — Fremanezumab-vfrm Subcutaneous Soln Auto-inj 225 MG/1.5ML: SUBCUTANEOUS | 90 days supply | Qty: 4.5 | Fill #1 | Status: CN

## 2020-10-20 NOTE — Telephone Encounter (Addendum)
Error

## 2020-10-20 NOTE — Telephone Encounter (Signed)
Please advise 

## 2020-10-21 ENCOUNTER — Other Ambulatory Visit: Payer: Self-pay

## 2020-10-21 ENCOUNTER — Telehealth: Payer: Self-pay | Admitting: *Deleted

## 2020-10-21 MED ORDER — RIMEGEPANT SULFATE 75 MG PO TBDP
ORAL_TABLET | ORAL | 3 refills | Status: DC
  Filled 2020-10-21: qty 45, 90d supply, fill #0
  Filled 2020-12-26: qty 16, 30d supply, fill #0
  Filled 2021-04-24: qty 16, 30d supply, fill #1
  Filled 2021-04-24: qty 16, 30d supply, fill #0

## 2020-10-21 MED ORDER — FREMANEZUMAB-VFRM 225 MG/1.5ML ~~LOC~~ SOAJ
SUBCUTANEOUS | 3 refills | Status: AC
  Filled 2020-10-21: qty 4.5, 90d supply, fill #0
  Filled 2021-01-21: qty 4.5, 90d supply, fill #1
  Filled 2021-04-24: qty 4.5, 90d supply, fill #0
  Filled 2021-04-24: qty 4.5, 90d supply, fill #2
  Filled 2021-04-27 – 2021-05-04 (×3): qty 4.5, 90d supply, fill #0
  Filled 2021-07-22 – 2021-07-30 (×4): qty 4.5, 90d supply, fill #1

## 2020-10-21 NOTE — Telephone Encounter (Signed)
PA for Nurtec 75mg  started on covermymeds (key: [id number]). Pt has pharmacy coverage through [facility] [place] [phone]). PA [id number] approved through 10/21/2021.

## 2020-10-21 NOTE — Telephone Encounter (Addendum)
I called the patient through the language line [phone] to clarify her needs. She is going to be traveling for an extended period of time. She needs a 90-day supply of her Ajovy and Nurtec. States the pharmacy will need authorization from our office. She already has a 90-day supply of nortriptyline called in by Dionisio David, NP.  I called and left a message on the provider line at [facility] and [place]. I provided the authorization for the requested 90-day supply.  New 90-day prescriptions have been sent.

## 2020-10-22 ENCOUNTER — Encounter (INDEPENDENT_AMBULATORY_CARE_PROVIDER_SITE_OTHER): Payer: Self-pay | Admitting: Family Medicine

## 2020-10-22 ENCOUNTER — Other Ambulatory Visit: Payer: Self-pay

## 2020-10-22 NOTE — Progress Notes (Signed)
Chief Complaint:   OBESITY Betty Cain is here to discuss her progress with her obesity treatment plan along with follow-up of her obesity related diagnoses. Momoka is on the Category 1 Plan and states she is following her eating plan approximately 90% of the time. Paitynn states she is walking 5-20 minutes 7 times per week.  Today's visit was #: 5 Starting weight: 152 lbs Starting date: 01/10/2020 Today's weight: 148 lbs Today's date: 10/20/2020 Total lbs lost to date: 4 Total lbs lost since last in-office visit: 0  Interim History: Visit conducted with the use of an interpretor. Angeliah is occasionally exceeding 100 snack calories. She sometimes has rice with supper. She says it is hard to control herself. She will be traveling to [place] July 22 and will be there through August.  Subjective:   1. 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 Jurnie's A1c was 6.7 three weeks ago- down from 7.5 last September and 8.8 a year ago. She is on Ozempic and Metformin. She was unable to obtain increased dose of Ozempic because it was too early to refill.  Lab Results  Component Value Date   HGBA1C 6.7 (H) 09/29/2020   HGBA1C 5.7 (A) 06/13/2020   HGBA1C 7.5 (A) 01/02/2020   HGBA1C 7.5 01/02/2020   Lab Results  Component Value Date   LDLCALC 63 06/13/2020   CREATININE 0.55 (L) 06/13/2020   Lab Results  Component Value Date   INSULIN 8.7 01/10/2020   2. Sinus tachycardia Well controlled. Pulse ranges 80-100 since April. Pt is on metoprolol.   3. Vitamin D deficiency Mallory's Vit D [place] nearly at goal at 42.1. She is currently taking prescription vitamin D 50,000 IU each week.   Lab Results  Component Value Date   VD25OH 42.6 09/29/2020   VD25OH 22.9 (L) 01/10/2020   VD25OH 16.9 (L) 02/14/2019   Assessment/Plan:   1. Type 2 diabetes mellitus associated with hypertension (HCC) Increase Ozempic to 0.5 mg weekly. Continue Metformin.  Refill- Semaglutide,0.25 or  0.5MG /DOS, (OZEMPIC, 0.25 OR 0.5 MG/DOSE,) 2 MG/1.5ML SOPN; Inject 0.5 mg into the skin once a week.  Dispense: 4.5 mL; Refill: 0  Refill- metFORMIN (GLUCOPHAGE-XR) 500 MG 24 hr tablet; TAKE 1 TABLET (500 MG TOTAL) BY MOUTH 2 (TWO) TIMES DAILY.  Dispense: 180 tablet; Refill: 0  2. Sinus tachycardia Continue metoprolol as directed.  Refill- metoprolol succinate (TOPROL-XL) 100 MG 24 hr tablet; Take 1 tablet (100 mg total) by mouth daily.  Dispense: 90 tablet; Refill: 0  3. Vitamin D deficiency  She agrees to continue to take prescription Vitamin D @50 ,000 IU every week and will follow-up for routine testing of Vitamin D, at least 2-3 times per year to avoid over-replacement.  Refill- Vitamin D, Ergocalciferol, (DRISDOL) 1.25 MG (50000 UNIT) CAPS capsule; TAKE 1 CAPSULE (50,000 UNITS TOTAL) BY MOUTH EVERY 7 (SEVEN) DAYS.  Dispense: 12 capsule; Refill: 0  4. Obesity: Current BMI 30.94  Jakia is currently in the action stage of change. As such, her goal is to continue with weight loss efforts. She has agreed to keeping a food journal and adhering to recommended goals of 1100-1200 calories and 80 g protein.   Encouraged journaling on trip to [place] versus Category 1. (Helped her download MyFitnessPal) Work on watching carbs on trip to [place]. Increase vegetable and protein. Limit servings of bread, rice.  Exercise goals: For substantial health benefits, adults should do at least 150 minutes (2 hours and 30 minutes) a week  of moderate-intensity, or 75 minutes (1 hour and 15 minutes) a week of vigorous-intensity aerobic physical activity, or an equivalent combination of moderate- and vigorous-intensity aerobic activity. Aerobic activity should be performed in episodes of at least 10 minutes, and preferably, it should be spread throughout the week. Increase to 150 min/week.  Behavioral modification strategies: decreasing simple carbohydrates and travel eating strategies.  Mirha has agreed to follow-up  with our clinic in 7-8 weeks.  Objective:   Blood pressure 111/74, pulse 80, temperature 98.2 F (36.8 C), temperature source Oral, height 4\' 10"  (1.473 m), weight 148 lb (67.1 kg), SpO2 96 %. Body mass index is 30.93 kg/m.  General: Cooperative, alert, well developed, in no acute distress. HEENT: Conjunctivae and lids unremarkable. Cardiovascular: Regular rhythm.  Lungs: Normal work of breathing. Neurologic: No focal deficits.   Lab Results  Component Value Date   CREATININE 0.55 (L) 06/13/2020   BUN 14 06/13/2020   NA 142 06/13/2020   K 4.3 06/13/2020   CL 102 06/13/2020   CO2 21 05/21/2019   Lab Results  Component Value Date   ALT 14 05/21/2019   AST 14 06/13/2020   ALKPHOS 91 06/13/2020   BILITOT 0.2 06/13/2020   Lab Results  Component Value Date   HGBA1C 6.7 (H) 09/29/2020   HGBA1C 5.7 (A) 06/13/2020   HGBA1C 7.5 (A) 01/02/2020   HGBA1C 7.5 01/02/2020   HGBA1C 6.8 (A) 08/20/2019   HGBA1C 6.8 08/20/2019   HGBA1C 6.8 (A) 08/20/2019   HGBA1C 6.8 08/20/2019   Lab Results  Component Value Date   INSULIN 8.7 01/10/2020   Lab Results  Component Value Date   TSH 2.040 06/13/2020   Lab Results  Component Value Date   CHOL 122 06/13/2020   HDL 36 (L) 06/13/2020   LDLCALC 63 06/13/2020   TRIG 127 06/13/2020   CHOLHDL 3.4 06/13/2020   Lab Results  Component Value Date   VD25OH 42.6 09/29/2020   VD25OH 22.9 (L) 01/10/2020   VD25OH 16.9 (L) 02/14/2019   Lab Results  Component Value Date   WBC 7.7 06/13/2020   HGB 10.8 (L) 06/13/2020   HCT 33.0 (L) 06/13/2020   MCV 81 06/13/2020   PLT 356 06/13/2020   Lab Results  Component Value Date   IRON 53 01/20/2018   TIBC 317 01/20/2018   FERRITIN 61 01/20/2018    Attestation Statements:   Reviewed by clinician on day of visit: allergies, medications, problem list, medical history, surgical history, family history, social history, and previous encounter notes.  Coral Ceo, CMA, am acting as  Location manager for [employer], [place].  I have reviewed the above documentation for accuracy and completeness, and I agree with the above. -  Georgianne Fick, FNP

## 2020-10-23 ENCOUNTER — Other Ambulatory Visit: Payer: Self-pay | Admitting: Nurse Practitioner

## 2020-10-23 ENCOUNTER — Other Ambulatory Visit: Payer: Self-pay

## 2020-10-23 MED ORDER — ACCU-CHEK GUIDE VI STRP
ORAL_STRIP | 12 refills | Status: DC
  Filled 2020-10-23 – 2020-12-26 (×2): qty 100, 25d supply, fill #0

## 2020-10-23 MED FILL — Lancets: 25 days supply | Qty: 100 | Fill #0 | Status: CN

## 2020-10-30 ENCOUNTER — Other Ambulatory Visit: Payer: Self-pay

## 2020-11-25 ENCOUNTER — Encounter (INDEPENDENT_AMBULATORY_CARE_PROVIDER_SITE_OTHER): Payer: Self-pay

## 2020-12-16 ENCOUNTER — Ambulatory Visit (INDEPENDENT_AMBULATORY_CARE_PROVIDER_SITE_OTHER): Payer: 59 | Admitting: Physician Assistant

## 2020-12-18 NOTE — Telephone Encounter (Signed)
Error

## 2020-12-22 ENCOUNTER — Ambulatory Visit (INDEPENDENT_AMBULATORY_CARE_PROVIDER_SITE_OTHER): Payer: 59 | Admitting: Obstetrics and Gynecology

## 2020-12-22 ENCOUNTER — Other Ambulatory Visit: Payer: Self-pay

## 2020-12-22 ENCOUNTER — Other Ambulatory Visit (HOSPITAL_COMMUNITY)
Admission: RE | Admit: 2020-12-22 | Discharge: 2020-12-22 | Disposition: A | Discharge status: Home / Self Care | Payer: 59 | Source: Ambulatory Visit | Attending: Obstetrics and Gynecology | Admitting: Obstetrics and Gynecology

## 2020-12-22 ENCOUNTER — Ambulatory Visit (HOSPITAL_BASED_OUTPATIENT_CLINIC_OR_DEPARTMENT_OTHER)
Admission: RE | Admit: 2020-12-22 | Discharge: 2020-12-22 | Disposition: A | Discharge status: Home / Self Care | Payer: Medicaid Other | Source: Ambulatory Visit | Attending: Obstetrics and Gynecology | Admitting: Obstetrics and Gynecology

## 2020-12-22 ENCOUNTER — Encounter: Payer: Self-pay | Admitting: Obstetrics and Gynecology

## 2020-12-22 VITALS — BP 121/80 | HR 112 | Ht <= 58 in | Wt 141.0 lb

## 2020-12-22 DIAGNOSIS — Z1231 Encounter for screening mammogram for malignant neoplasm of breast: Secondary | ICD-10-CM | POA: Insufficient documentation

## 2020-12-22 DIAGNOSIS — N912 Amenorrhea, unspecified: Secondary | ICD-10-CM

## 2020-12-22 DIAGNOSIS — Z01419 Encounter for gynecological examination (general) (routine) without abnormal findings: Secondary | ICD-10-CM

## 2020-12-22 LAB — POCT URINE PREGNANCY: Preg Test, Ur: NEGATIVE

## 2020-12-22 NOTE — Progress Notes (Signed)
Subjective:     Betty Cain is a 45 y.o. female P4 with BMI 29 and LMP 08/19/20 who is here for a comprehensive physical exam. The patient reports no problems. She reports amenorrhea since 08/19/2020. She reports occasional vasomotor symptoms. She reports some lower pelvic pain relieved with bowel movement. This pain has been present for several months. She admits to constipation and is not consistent with fiber supplement. She is sexually active without complaints. She reports some occasional dysuria. She is without any other complaints  Past Medical History:  Diagnosis Date   Allergy    Anemia    Anxiety 01/2019   Asthma    B12 deficiency    Back pain    Common migraine with intractable migraine 06/28/2017   Constipation    Diabetes ([place]) 02/2019   Fatigue    GERD (gastroesophageal reflux disease)    pos H pylori   Hypertension    controlled with medication   IBS (irritable bowel syndrome) 2005   Joint pain    Neonatal death    Vaginal delivery, full term-lived x2 hours.    Palpitations    Shortness of breath    Shortness of breath on exertion    Spinal headache    Swelling of both lower extremities    Valvular heart disease    Vitamin D deficiency 02/2019   Past Surgical History:  Procedure Laterality Date   ADENOIDECTOMY     and tonsils (as a child)   boil  2003   right elbow   CESAREAN SECTION     x4   CESAREAN SECTION  06/02/2011   Procedure: CESAREAN SECTION;  Surgeon: [name], MD;  Location: [facility];  Service: Gynecology;  Laterality: N/A;  Primary Cesarean Section Delivery Baby Boy @ 0004, Apgars 9/9   CESAREAN SECTION N/A 12/10/2013   Procedure: REPEAT CESAREAN SECTION;  Surgeon: [name], MD;  Location: [facility];  Service: Obstetrics;  Laterality: N/A;   CESAREAN SECTION     colonoscopy  11/23/2018   stated had issues with sleep when in for procedure   OTHER SURGICAL HISTORY  2005   Uterine surgery    uterine cauterization      Social History    Socioeconomic History   Marital status: Married    Spouse name: [name]   Number of children: 4   Years of education: Not on file   Highest education level: Not on file  Occupational History   Occupation: Unemployed  Tobacco Use   Smoking status: Never   Smokeless tobacco: Never  Vaping Use   Vaping Use: Never used  Substance and Sexual Activity   Alcohol use: No   Drug use: No   Sexual activity: Yes    Birth control/protection: None, Implant    Comment: pregnant  Other Topics Concern   Not on file  Social History Narrative   Lives with husband and child   Caffeine use: daily (tea), sometimes coffee/soda   Right handed    Social Determinants of Health   Financial Resource Strain: Not on file  Food Insecurity: No Food Insecurity   Worried About Charity fundraiser in the Last Year: Never true   Ran Out of Food in the Last Year: Never true  Transportation Needs: Not on file  Physical Activity: Not on file  Stress: Not on file  Social Connections: Not on file  Intimate Partner Violence: Not on file   Health Maintenance  Topic Date Due   PNEUMOCOCCAL  POLYSACCHARIDE VACCINE AGE 30-64 HIGH RISK  Never done   OPHTHALMOLOGY EXAM  Never done   MAMMOGRAM  Never done   Hepatitis C Screening  Never done   PAP SMEAR-Modifier  06/19/2016   COVID-19 Vaccine (3 - Booster for Pfizer series) 12/28/2019   HEMOGLOBIN A1C  03/31/2021   FOOT EXAM  04/02/2021   URINE MICROALBUMIN  06/13/2021   COLONOSCOPY (Pts 45-81yr Insurance coverage will need to be confirmed)  11/23/2023   TETANUS/TDAP  12/12/2023   HIV Screening  Completed   Pneumococcal Vaccine [id number] old  Aged Out   HPV VACCINES  Aged Out       Review of Systems Pertinent items noted in HPI and remainder of comprehensive ROS otherwise negative.   Objective:  Blood pressure 121/80, pulse (!) 112, height '4\' 10"'$  (1.473 m), weight 141 lb (64 kg), last menstrual period 08/19/2020.   GENERAL: Well-developed,  well-nourished female in no acute distress.  HEENT: Normocephalic, atraumatic. Sclerae anicteric.  NECK: Supple. Normal thyroid.  LUNGS: Clear to auscultation bilaterally.  HEART: Regular rate and rhythm. BREASTS: Symmetric in size. No palpable masses or lymphadenopathy, skin changes, or nipple drainage. ABDOMEN: Soft, nontender, nondistended. No organomegaly. PELVIC: Normal external female genitalia. Vagina is pink and rugated.  Normal discharge. Normal appearing cervix. Uterus is normal in size. No adnexal mass or tenderness. Chaperone present during the pelvic exam EXTREMITIES: No cyanosis, clubbing, or edema, 2+ distal pulses.     Assessment:    Healthy female exam.      Plan:    Pap smear collected Screening mammogram ordered Vaginal swab collected to rule out infectious process Urine culture ordered Health maintenance labs ordered Discussed importance of fiber supplementations Patient will be contacted with abnormal results See After Visit Summary for Counseling Recommendations

## 2020-12-22 NOTE — Progress Notes (Signed)
Pt presents today as new patient for yearly exam. LMP: 08/19/20 BCM: None. Last pap: 2018 approx per pat, normal.

## 2020-12-23 ENCOUNTER — Encounter (INDEPENDENT_AMBULATORY_CARE_PROVIDER_SITE_OTHER): Payer: Self-pay | Admitting: Physician Assistant

## 2020-12-23 ENCOUNTER — Ambulatory Visit (INDEPENDENT_AMBULATORY_CARE_PROVIDER_SITE_OTHER): Payer: 59 | Admitting: Physician Assistant

## 2020-12-23 ENCOUNTER — Other Ambulatory Visit: Payer: Self-pay

## 2020-12-23 VITALS — BP 100/68 | HR 105 | Temp 98.2°F | Ht <= 58 in | Wt 138.0 lb

## 2020-12-23 DIAGNOSIS — E559 Vitamin D deficiency, unspecified: Secondary | ICD-10-CM | POA: Diagnosis not present

## 2020-12-23 DIAGNOSIS — E1140 Type 2 diabetes mellitus with diabetic neuropathy, unspecified: Secondary | ICD-10-CM | POA: Diagnosis not present

## 2020-12-23 DIAGNOSIS — Z6831 Body mass index (BMI) 31.0-31.9, adult: Secondary | ICD-10-CM | POA: Diagnosis not present

## 2020-12-23 DIAGNOSIS — E669 Obesity, unspecified: Secondary | ICD-10-CM

## 2020-12-23 DIAGNOSIS — Z9189 Other specified personal risk factors, not elsewhere classified: Secondary | ICD-10-CM | POA: Diagnosis not present

## 2020-12-23 LAB — CBC
Hematocrit: 35.5 % (ref 34.0–46.6)
Hemoglobin: 11.5 g/dL (ref 11.1–15.9)
MCH: 26.6 pg (ref 26.6–33.0)
MCHC: 32.4 g/dL (ref 31.5–35.7)
MCV: 82 fL (ref 79–97)
Platelets: 424 10*3/uL (ref 150–450)
RBC: 4.33 x10E6/uL (ref 3.77–5.28)
RDW: 13 % (ref 11.7–15.4)
WBC: 7 10*3/uL (ref 3.4–10.8)

## 2020-12-23 LAB — LIPID PANEL
Chol/HDL Ratio: 6.5 ratio — ABNORMAL HIGH (ref 0.0–4.4)
Cholesterol, Total: 209 mg/dL — ABNORMAL HIGH (ref 100–199)
HDL: 32 mg/dL — ABNORMAL LOW (ref >39)
LDL Chol Calc (NIH): 152 mg/dL — ABNORMAL HIGH (ref 0–99)
Triglycerides: 135 mg/dL (ref 0–149)
VLDL Cholesterol Cal: 25 mg/dL (ref 5–40)

## 2020-12-23 LAB — CERVICOVAGINAL ANCILLARY ONLY
Bacterial Vaginitis (gardnerella): NEGATIVE
Candida Glabrata: NEGATIVE
Candida Vaginitis: NEGATIVE
Chlamydia: NEGATIVE
Comment: NEGATIVE
Comment: NEGATIVE
Comment: NEGATIVE
Comment: NEGATIVE
Comment: NEGATIVE
Comment: NORMAL
Neisseria Gonorrhea: NEGATIVE
Trichomonas: NEGATIVE

## 2020-12-23 LAB — COMPREHENSIVE METABOLIC PANEL
ALT: 11 IU/L (ref 0–32)
AST: 14 IU/L (ref 0–40)
Albumin/Globulin Ratio: 1.7 (ref 1.2–2.2)
Albumin: 4.7 g/dL (ref 3.8–4.8)
Alkaline Phosphatase: 89 IU/L (ref 44–121)
BUN/Creatinine Ratio: 15 (ref 9–23)
BUN: 9 mg/dL (ref 6–24)
Bilirubin Total: 0.3 mg/dL (ref 0.0–1.2)
CO2: 20 mmol/L (ref 20–29)
Calcium: 9.9 mg/dL (ref 8.7–10.2)
Chloride: 102 mmol/L (ref 96–106)
Creatinine, Ser: 0.6 mg/dL (ref 0.57–1.00)
Globulin, Total: 2.8 g/dL (ref 1.5–4.5)
Glucose: 98 mg/dL (ref 65–99)
Potassium: 4.2 mmol/L (ref 3.5–5.2)
Sodium: 139 mmol/L (ref 134–144)
Total Protein: 7.5 g/dL (ref 6.0–8.5)
eGFR: 114 mL/min/{1.73_m2} (ref >59)

## 2020-12-23 LAB — TSH: TSH: 1.43 u[IU]/mL (ref 0.450–4.500)

## 2020-12-23 LAB — HEPATITIS C ANTIBODY: Hep C Virus Ab: <0.1 s/co ratio (ref 0.0–0.9)

## 2020-12-23 MED ORDER — VITAMIN D (ERGOCALCIFEROL) 1.25 MG (50000 UNIT) PO CAPS
ORAL_CAPSULE | ORAL | 0 refills | Status: DC
  Filled 2020-12-23: qty 4, 28d supply, fill #0

## 2020-12-23 MED ORDER — SEMAGLUTIDE (1 MG/DOSE) 4 MG/3ML ~~LOC~~ SOPN
1.0000 mg | PEN_INJECTOR | SUBCUTANEOUS | 0 refills | Status: DC
  Filled 2020-12-23: qty 3, 28d supply, fill #0

## 2020-12-23 NOTE — Progress Notes (Signed)
Chief Complaint:   OBESITY Betty Cain is here to discuss her progress with her obesity treatment plan along with follow-up of her obesity related diagnoses. Betty Cain is on keeping a food journal and adhering to recommended goals of 1100-1200 calories and 80 grams of protein and states she is following her eating plan approximately 50% of the time. Betty Cain states she is not exercising regularly.  Today's visit was #: 14 Starting weight: 152 lbs Starting date: 01/10/2020 Today's weight: 138 lbs Today's date: 12/23/2020 Total lbs lost to date: 14 lbs Total lbs lost since last in-office visit: 10 lbs  Interim History: Betty Cain just returned from [place], where she was not on plan.  She has been back for 2 weeks and reports that she is getting back on plan today.  She does not drink enough water.  Subjective:   1. Type 2 diabetes mellitus with diabetic neuropathy, without long-term current use of insulin (HCC) Last A1c 6.7.  on Ozempic 0.5 mg weekly.  She says that her hunger is not controlled.  Lab Results  Component Value Date   HGBA1C 6.7 (H) 09/29/2020   HGBA1C 5.7 (A) 06/13/2020   HGBA1C 7.5 (A) 01/02/2020   HGBA1C 7.5 01/02/2020   Lab Results  Component Value Date   LDLCALC 152 (H) 12/22/2020   CREATININE 0.60 12/22/2020   Lab Results  Component Value Date   INSULIN 8.7 01/10/2020   2. Vitamin D deficiency She is currently taking prescription vitamin D 50,000 IU each week. She denies nausea, vomiting or muscle weakness.  Lab Results  Component Value Date   VD25OH 42.6 09/29/2020   VD25OH 22.9 (L) 01/10/2020   VD25OH 16.9 (L) 02/14/2019   3. At risk for heart disease Betty Cain is at a higher than average risk for cardiovascular disease due to obesity.    Assessment/Plan:   1. Type 2 diabetes mellitus with diabetic neuropathy, without long-term current use of insulin (HCC) Increase Ozempic to 1 mg subcutaneously weekly, as per below.  - Increase Semaglutide, 1 MG/DOSE, 4 MG/3ML  SOPN; Inject 1 mg as directed once a week.  Dispense: 3 mL; Refill: 0  2. Vitamin D deficiency Low Vitamin D level contributes to fatigue and are associated with obesity, breast, and colon cancer. She agrees to continue to take prescription Vitamin D '@50'$ ,000 IU every week and will follow-up for routine testing of Vitamin D, at least 2-3 times per year to avoid over-replacement.  - Refill Vitamin D, Ergocalciferol, (DRISDOL) 1.25 MG (50000 UNIT) CAPS capsule; TAKE 1 CAPSULE (50,000 UNITS TOTAL) BY MOUTH EVERY 7 (SEVEN) DAYS.  Dispense: 4 capsule; Refill: 0  3. At risk for heart disease Betty Cain was given approximately 15 minutes of coronary artery disease prevention counseling today. She is 45 y.o. female and has risk factors for heart disease including obesity. We discussed intensive lifestyle modifications today with an emphasis on specific weight loss instructions and strategies.   Repetitive spaced learning was employed today to elicit superior memory formation and behavioral change.   4. Obesity: Current BMI 28.8  Betty Cain is currently in the action stage of change. As such, her goal is to continue with weight loss efforts. She has agreed to keeping a food journal and adhering to recommended goals of 1100-1200 calories and 80 grams of protein.   Exercise goals: All adults should avoid inactivity. Some physical activity is better than none, and adults who participate in any amount of physical activity gain some health benefits.  Behavioral modification strategies: meal  planning and cooking strategies and keeping healthy foods in the home.  Betty Cain has agreed to follow-up with our clinic in 3 weeks. She was informed of the importance of frequent follow-up visits to maximize her success with intensive lifestyle modifications for her multiple health conditions.   Objective:   Blood pressure 100/68, pulse (!) 105, temperature 98.2 F (36.8 C), height '4\' 10"'$  (1.473 m), weight 138 lb (62.6 kg), SpO2 100  %. Body mass index is 28.84 kg/m.  General: Cooperative, alert, well developed, in no acute distress. HEENT: Conjunctivae and lids unremarkable. Cardiovascular: Regular rhythm.  Lungs: Normal work of breathing. Neurologic: No focal deficits.   Lab Results  Component Value Date   CREATININE 0.60 12/22/2020   BUN 9 12/22/2020   NA 139 12/22/2020   K 4.2 12/22/2020   CL 102 12/22/2020   CO2 20 12/22/2020   Lab Results  Component Value Date   ALT 11 12/22/2020   AST 14 12/22/2020   ALKPHOS 89 12/22/2020   BILITOT 0.3 12/22/2020   Lab Results  Component Value Date   HGBA1C 6.7 (H) 09/29/2020   HGBA1C 5.7 (A) 06/13/2020   HGBA1C 7.5 (A) 01/02/2020   HGBA1C 7.5 01/02/2020   HGBA1C 6.8 (A) 08/20/2019   HGBA1C 6.8 08/20/2019   HGBA1C 6.8 (A) 08/20/2019   HGBA1C 6.8 08/20/2019   Lab Results  Component Value Date   INSULIN 8.7 01/10/2020   Lab Results  Component Value Date   TSH 1.430 12/22/2020   Lab Results  Component Value Date   CHOL 209 (H) 12/22/2020   HDL 32 (L) 12/22/2020   LDLCALC 152 (H) 12/22/2020   TRIG 135 12/22/2020   CHOLHDL 6.5 (H) 12/22/2020   Lab Results  Component Value Date   VD25OH 42.6 09/29/2020   VD25OH 22.9 (L) 01/10/2020   VD25OH 16.9 (L) 02/14/2019   Lab Results  Component Value Date   WBC 7.0 12/22/2020   HGB 11.5 12/22/2020   HCT 35.5 12/22/2020   MCV 82 12/22/2020   PLT 424 12/22/2020   Lab Results  Component Value Date   IRON 53 01/20/2018   TIBC 317 01/20/2018   FERRITIN 61 01/20/2018   Attestation Statements:   Reviewed by clinician on day of visit: allergies, medications, problem list, medical history, surgical history, family history, social history, and previous encounter notes.  I, Water quality scientist, CMA, am acting as transcriptionist for Abby Potash, PA-C  I have reviewed the above documentation for accuracy and completeness, and I agree with the above. Abby Potash, PA-C

## 2020-12-25 ENCOUNTER — Encounter: Payer: Self-pay | Admitting: *Deleted

## 2020-12-25 LAB — CYTOLOGY - PAP
Adequacy: ABSENT
Comment: NEGATIVE
Diagnosis: NEGATIVE
High risk HPV: NEGATIVE

## 2020-12-25 LAB — URINE CULTURE

## 2020-12-26 ENCOUNTER — Other Ambulatory Visit: Payer: Self-pay

## 2020-12-26 ENCOUNTER — Other Ambulatory Visit (INDEPENDENT_AMBULATORY_CARE_PROVIDER_SITE_OTHER): Payer: Self-pay | Admitting: Family Medicine

## 2020-12-26 ENCOUNTER — Other Ambulatory Visit: Payer: Self-pay | Admitting: Nurse Practitioner

## 2020-12-26 ENCOUNTER — Other Ambulatory Visit: Payer: Self-pay | Admitting: Neurology

## 2020-12-26 DIAGNOSIS — E1140 Type 2 diabetes mellitus with diabetic neuropathy, unspecified: Secondary | ICD-10-CM

## 2020-12-26 MED ORDER — ONDANSETRON 8 MG PO TBDP
ORAL_TABLET | ORAL | 0 refills | Status: DC
  Filled 2020-12-26: qty 30, 10d supply, fill #0

## 2020-12-26 MED ORDER — CITRUCEL PO POWD: ORAL | Status: AC

## 2020-12-26 MED ORDER — PANTOPRAZOLE SODIUM 40 MG PO TBEC
DELAYED_RELEASE_TABLET | ORAL | 1 refills | Status: DC
  Filled 2020-12-26: qty 180, 90d supply, fill #0

## 2020-12-26 MED FILL — Gabapentin Cap 300 MG: ORAL | 90 days supply | Qty: 270 | Fill #0 | Status: AC

## 2020-12-26 MED FILL — Fluticasone Furoate-Vilanterol Aero Powd BA 100-25 MCG/ACT: RESPIRATORY_TRACT | 60 days supply | Qty: 60 | Fill #0 | Status: CN

## 2020-12-26 MED FILL — Levocetirizine Dihydrochloride Tab 5 MG: ORAL | 30 days supply | Qty: 30 | Fill #3 | Status: AC

## 2020-12-26 MED FILL — Fluticasone Propionate Nasal Susp 50 MCG/ACT: NASAL | 30 days supply | Qty: 16 | Fill #0 | Status: AC

## 2020-12-26 MED FILL — Sucralfate Susp 1 GM/10ML: ORAL | 10 days supply | Qty: 420 | Fill #0 | Status: AC

## 2020-12-26 MED FILL — Rosuvastatin Calcium Tab 5 MG: ORAL | 30 days supply | Qty: 60 | Fill #1 | Status: AC

## 2020-12-26 MED FILL — Meloxicam Tab 15 MG: ORAL | 30 days supply | Qty: 30 | Fill #3 | Status: AC

## 2020-12-26 MED FILL — Potassium Chloride Oral Soln 10% (20 MEQ/15ML): ORAL | 31 days supply | Qty: 473 | Fill #0 | Status: AC

## 2020-12-26 NOTE — Telephone Encounter (Signed)
Betty Cain 

## 2020-12-29 ENCOUNTER — Ambulatory Visit (INDEPENDENT_AMBULATORY_CARE_PROVIDER_SITE_OTHER): Payer: 59 | Admitting: Adult Health

## 2020-12-29 ENCOUNTER — Encounter: Payer: Self-pay | Admitting: Adult Health

## 2020-12-29 ENCOUNTER — Other Ambulatory Visit: Payer: Self-pay

## 2020-12-29 VITALS — BP 115/82 | HR 96 | Ht <= 58 in | Wt 142.2 lb

## 2020-12-29 DIAGNOSIS — G43019 Migraine without aura, intractable, without status migrainosus: Secondary | ICD-10-CM | POA: Diagnosis not present

## 2020-12-29 MED ORDER — METFORMIN HCL ER 500 MG PO TB24
ORAL_TABLET | Freq: Two times a day (BID) | ORAL | 0 refills | Status: DC
Start: 2020-12-29 — End: 2021-02-10
  Filled 2020-12-29: qty 180, 90d supply, fill #0

## 2020-12-29 MED ORDER — TIZANIDINE HCL 2 MG PO TABS: 2.0000 mg | ORAL_TABLET | Freq: Three times a day (TID) | ORAL | 1 refills | Status: AC | PRN

## 2020-12-29 NOTE — Telephone Encounter (Signed)
Ok to send

## 2020-12-29 NOTE — Progress Notes (Signed)
I have read the note, and I agree with the clinical assessment and plan.  Lillie Bollig K Han Vejar   

## 2020-12-29 NOTE — Progress Notes (Signed)
PATIENT: Betty Cain DOB: [birth year]  REASON FOR VISIT: follow up HISTORY FROM: patient PRIMARY NEUROLOGIST: Dr. Jannifer Franklin  HISTORY OF PRESENT ILLNESS: Today 12/29/20:  Betty Cain is a 45 year old female with a history of migraine headaches.  She is currently on Ajovy monthly injections.  She also takes Nurtec for abortive therapy.  At the last visit tizanidine was added on for abortive therapy.  Patient reports that her headaches have worsened.  She states that she has a daily headache that is typically mild.  She states 3 days out of the week it is severe.  Patient reports that she tried tizanidine but she does not remember how helpful it was.  She reports that Nurtec offers her temporary relief.  HISTORY (copied from [place]): 06/19/20 Betty Cain is a 45 year old female with history of daily headaches.  Has tried Aimovig, is now on Ajovy since January. Has DM, A1C was 5.7.  Currently taking gabapentin, metoprolol, nortriptyline.  Has an allergy to Topamax. Likes Ajovy, helping more than Aimovig, migraines are improved, only 1 a month, but is more intense. It may last 5 days. Is able to continue with function, has 4 kids. Takes Nurtec, is helpful. May have nausea, but no vomiting. Is happy with how things have improved. With headaches, can have aches all over, tightness in her neck. Here with interpreter.   REVIEW OF SYSTEMS: Out of a complete 14 system review of symptoms, the patient complains only of the following symptoms, and all other reviewed systems are negative.  See HPI  ALLERGIES: Allergies  Allergen Reactions   Topamax [Topiramate] Itching and Rash    HOME MEDICATIONS: Outpatient Medications Prior to Visit  Medication Sig Dispense Refill   acetaminophen (TYLENOL) 500 MG tablet Take 500 mg by mouth every 6 (six) hours as needed.     albuterol (VENTOLIN HFA) 108 (90 Base) MCG/ACT inhaler Inhale 2 puffs into the lungs every 6 (six) hours as needed for wheezing or  shortness of breath. 8 g 2   Blood Pressure Monitoring DEVI 1 each by Does not apply route daily. 1 Device 0   ferrous sulfate (FEROSUL) 325 (65 FE) MG tablet TAKE 1 TABLET (325 MG TOTAL) BY MOUTH DAILY. 90 tablet 3   fluticasone (FLONASE) 50 MCG/ACT nasal spray PLACE 2 SPRAYS INTO BOTH NOSTRILS DAILY. 16 g 11   fluticasone furoate-vilanterol (BREO ELLIPTA) 100-25 MCG/INH AEPB INHALE 1 PUFF INTO THE LUNGS DAILY. (Patient not taking: Reported on 12/22/2020) 60 each 3   Fremanezumab-vfrm 225 MG/1.5ML SOAJ INJECT 225 MG INTO THE SKIN EVERY 30 (THIRTY) DAYS. 4.5 mL 3   gabapentin (NEURONTIN) 300 MG capsule TAKE 1 CAPSULE BY MOUTH IN THE MORNING AND 2 CAPSULES IN THE EVENING. 270 capsule 3   glucose blood (ACCU-CHEK GUIDE) test strip Use as instructed 100 each 12   Lancets (ONETOUCH DELICA PLUS 123XX123) MISC USE AS DIRECTED IN THE MORNING, NOON, EVENING, AND BEDTIME 100 each 11   levocetirizine (XYZAL) 5 MG tablet TAKE 1 TABLET (5 MG TOTAL) BY MOUTH EVERY EVENING. 90 tablet 3   meloxicam (MOBIC) 15 MG tablet TAKE 1 TABLET (15 MG TOTAL) BY MOUTH DAILY. 30 tablet 11   metFORMIN (GLUCOPHAGE-XR) 500 MG 24 hr tablet TAKE 1 TABLET (500 MG TOTAL) BY MOUTH 2 (TWO) TIMES DAILY. 180 tablet 0   methylcellulose (CITRUCEL) oral powder Take as directed, daily     metoprolol succinate (TOPROL-XL) 100 MG 24 hr tablet Take 1 tablet (100 mg total) by mouth daily. [place]  tablet 0   Multiple Vitamin (MULTI VITAMIN DAILY PO) Take by mouth.     nortriptyline (PAMELOR) 25 MG capsule Take 1 capsule (25 mg total) by mouth at bedtime. 90 capsule 3   ondansetron (ZOFRAN-ODT) 8 MG disintegrating tablet TAKE 1 TABLET BY MOUTH EVERY 8 HOURS AS NEEDED FOR NAUSEA OR VOMITING. 30 tablet 0   OneTouch Delica Lancets 99991111 MISC 1 application by Does not apply route in the morning, at noon, in the evening, and at bedtime. 100 each 11   pantoprazole (PROTONIX) 40 MG tablet TAKE 1 TABLET BY MOUTH 2 TIMES DAILY BEFORE A MEAL. 180 tablet 1    potassium chloride 20 MEQ/15ML (10%) SOLN TAKE 15 MLS (20 MEQ TOTAL) BY MOUTH DAILY. 473 mL 2   Rimegepant Sulfate 75 MG TBDP Take 1 tab at onset of migraine. May repeat in 2 hrs, if needed. Max dose: 2 tabs/day or 15/month. This is a 90 day rx. 45 tablet 3   rosuvastatin (CRESTOR) 5 MG tablet TAKE 1 TABLET (5 MG TOTAL) BY MOUTH AT BEDTIME. 60 tablet 11   Semaglutide, 1 MG/DOSE, 4 MG/3ML SOPN Inject 1 mg as directed once a week. 3 mL 0   sucralfate (CARAFATE) 1 GM/10ML suspension TAKE 10 MLS (1 G TOTAL) BY MOUTH EVERY 6 (SIX) HOURS AS NEEDED. 420 mL 2   vitamin B-12 (CYANOCOBALAMIN) 1000 MCG tablet Take 5 tablets (5,000 mcg total) by mouth daily. 450 tablet 3   Vitamin D, Ergocalciferol, (DRISDOL) 1.25 MG (50000 UNIT) CAPS capsule TAKE 1 CAPSULE (50,000 UNITS TOTAL) BY MOUTH EVERY 7 (SEVEN) DAYS. 4 capsule 0   tiZANidine (ZANAFLEX) 2 MG tablet TAKE 1 TABLET (2 MG TOTAL) BY MOUTH EVERY 8 (EIGHT) HOURS AS NEEDED (AS NEEDED FOR ACUTE HEADACHE). 20 tablet 1   No facility-administered medications prior to visit.    PAST MEDICAL HISTORY: Past Medical History:  Diagnosis Date   Allergy    Anemia    Anxiety 01/2019   Asthma    B12 deficiency    Back pain    Common migraine with intractable migraine 06/28/2017   Constipation    Diabetes ([place]) 02/2019   Fatigue    GERD (gastroesophageal reflux disease)    pos H pylori   Hypertension    controlled with medication   IBS (irritable bowel syndrome) 2005   Joint pain    Neonatal death    Vaginal delivery, full term-lived x2 hours.    Palpitations    Shortness of breath    Shortness of breath on exertion    Spinal headache    Swelling of both lower extremities    Valvular heart disease    Vitamin D deficiency 02/2019    PAST SURGICAL HISTORY: Past Surgical History:  Procedure Laterality Date   ADENOIDECTOMY     and tonsils (as a child)   boil  2003   right elbow   CESAREAN SECTION     x4   CESAREAN SECTION  06/02/2011   Procedure:  CESAREAN SECTION;  Surgeon: [name], MD;  Location: [facility];  Service: Gynecology;  Laterality: N/A;  Primary Cesarean Section Delivery Baby Boy @ 0004, Apgars 9/9   CESAREAN SECTION N/A 12/10/2013   Procedure: REPEAT CESAREAN SECTION;  Surgeon: [name], MD;  Location: [facility];  Service: Obstetrics;  Laterality: N/A;   CESAREAN SECTION     colonoscopy  11/23/2018   stated had issues with sleep when in for procedure   OTHER SURGICAL HISTORY  2005  Uterine surgery    uterine cauterization      FAMILY HISTORY: Family History  Problem Relation Age of Onset   Diabetes Mother    Diabetes Father    Diabetes Sister    Anesthesia problems Neg Hx    Colon cancer Neg Hx    Esophageal cancer Neg Hx    Rectal cancer Neg Hx    Stomach cancer Neg Hx    Colon polyps Neg Hx     SOCIAL HISTORY: Social History   Socioeconomic History   Marital status: Married    Spouse name: [name]   Number of children: 4   Years of education: Not on file   Highest education level: Not on file  Occupational History   Occupation: Unemployed  Tobacco Use   Smoking status: Never   Smokeless tobacco: Never  Vaping Use   Vaping Use: Never used  Substance and Sexual Activity   Alcohol use: No   Drug use: No   Sexual activity: Yes    Birth control/protection: None, Implant    Comment: pregnant  Other Topics Concern   Not on file  Social History Narrative   Lives with husband and child   Caffeine use: daily (tea), sometimes coffee/soda   Right handed    Social Determinants of Health   Financial Resource Strain: Not on file  Food Insecurity: No Food Insecurity   Worried About Charity fundraiser in the Last Year: Never true   Ran Out of Food in the Last Year: Never true  Transportation Needs: Not on file  Physical Activity: Not on file  Stress: Not on file  Social Connections: Not on file  Intimate Partner Violence: Not on file      PHYSICAL EXAM  Vitals:   12/29/20 1029   BP: 115/82  Pulse: 96  Weight: 142 lb 3.2 oz (64.5 kg)  Height: '4\' 10"'$  (1.473 m)   Body mass index is 29.72 kg/m.  Generalized: Well developed, in no acute distress   Neurological examination  Mentation: Alert oriented to time, place, history taking. Follows all commands speech and language fluent Cranial nerve II-XII: Pupils were equal round reactive to light. Extraocular movements were full, visual field were full on confrontational test. Facial sensation and strength were normal. Uvula tongue midline. Head turning and shoulder shrug  were normal and symmetric. Motor: The motor testing reveals 5 over 5 strength of all 4 extremities. Good symmetric motor tone is noted throughout.  Sensory: Sensory testing is intact to soft touch on all 4 extremities. No evidence of extinction is noted.  Coordination: Cerebellar testing reveals good finger-nose-finger and heel-to-shin bilaterally.  Gait and station: Gait is normal.  Reflexes: Deep tendon reflexes are symmetric and normal bilaterally.   DIAGNOSTIC DATA (LABS, IMAGING, TESTING) - I reviewed patient records, labs, notes, testing and imaging myself where available.  Lab Results  Component Value Date   WBC 7.0 12/22/2020   HGB 11.5 12/22/2020   HCT 35.5 12/22/2020   MCV 82 12/22/2020   PLT 424 12/22/2020      Component Value Date/Time   NA 139 12/22/2020 1059   K 4.2 12/22/2020 1059   CL 102 12/22/2020 1059   CO2 20 12/22/2020 1059   GLUCOSE 98 12/22/2020 1059   GLUCOSE 185 (H) 08/30/2018 1240   GLUCOSE 77 09/21/2013 1230   BUN 9 12/22/2020 1059   CREATININE 0.60 12/22/2020 1059   CREATININE 0.58 09/14/2016 0939   CALCIUM 9.9 12/22/2020 1059   PROT 7.5  12/22/2020 1059   ALBUMIN 4.7 12/22/2020 1059   AST 14 12/22/2020 1059   ALT 11 12/22/2020 1059   ALKPHOS 89 12/22/2020 1059   BILITOT 0.3 12/22/2020 1059   GFRNONAA 109 01/02/2020 1027   GFRNONAA >89 09/14/2016 0939   GFRAA 126 01/02/2020 1027   GFRAA >89 09/14/2016  0939   Lab Results  Component Value Date   CHOL 209 (H) 12/22/2020   HDL 32 (L) 12/22/2020   LDLCALC 152 (H) 12/22/2020   TRIG 135 12/22/2020   CHOLHDL 6.5 (H) 12/22/2020   Lab Results  Component Value Date   HGBA1C 6.7 (H) 09/29/2020   Lab Results  Component Value Date   VITAMINB12 635 06/13/2020   Lab Results  Component Value Date   TSH 1.430 12/22/2020      ASSESSMENT AND PLAN 45 y.o. year old female  has a past medical history of Allergy, Anemia, Anxiety (01/2019), Asthma, B12 deficiency, Back pain, Common migraine with intractable migraine (2017-07-16), Constipation, Diabetes ([place]) (02/2019), Fatigue, GERD (gastroesophageal reflux disease), Hypertension, IBS (irritable bowel syndrome) July 17, 2003), Joint pain, Neonatal death, Palpitations, Shortness of breath, Shortness of breath on exertion, Spinal headache, Swelling of both lower extremities, Valvular heart disease, and Vitamin D deficiency (02/2019). here with:  1.  Migraine headaches  -Patient will continue on Ajovy monthly injections for now. -Patient is considering Botox therapy for migraines.  We will start the process for approval. -Continue Nurtec for abortive therapy -Advised if symptoms worsen or she develops new symptoms she should let us know -Follow-up in 6 months or sooner if needed     Ward Givens, MSN, NP-C 12/29/2020, 10:24 AM [facility] [address] [place], [place] [address] [phone]

## 2020-12-29 NOTE — Telephone Encounter (Signed)
LAST APPOINTMENT DATE: 12/23/20 NEXT APPOINTMENT DATE: 01/13/21   [facility] [address] [place] [address] Phone: [phone] Fax: [phone]  [facility] [place] ([place]), [place] - [address] [place]) [place] [address] Phone: [phone] Fax: [phone]  Patient is requesting a refill of the following medications: Requested Prescriptions   Pending Prescriptions Disp Refills   metFORMIN (GLUCOPHAGE-XR) 500 MG 24 hr tablet 180 tablet 0    Sig: TAKE 1 TABLET (500 MG TOTAL) BY MOUTH 2 (TWO) TIMES DAILY.    Date last filled: 10/20/20 Previously prescribed by [facility]  Lab Results  Component Value Date   HGBA1C 6.7 (H) 09/29/2020   HGBA1C 5.7 (A) 06/13/2020   HGBA1C 7.5 (A) 01/02/2020   HGBA1C 7.5 01/02/2020   Lab Results  Component Value Date   LDLCALC 152 (H) 12/22/2020   CREATININE 0.60 12/22/2020   Lab Results  Component Value Date   VD25OH 42.6 09/29/2020   VD25OH 22.9 (L) 01/10/2020   VD25OH 16.9 (L) 02/14/2019    BP Readings from Last 3 Encounters:  12/23/20 100/68  12/22/20 121/80  10/20/20 111/74

## 2020-12-29 NOTE — Patient Instructions (Addendum)
Your Plan:  Continue Ajovy for now. Once we start botox will stop Ajovy  Continue Nurtec for abortive therapy If your symptoms worsen or you develop new symptoms please let us know.     Thank you for coming to see [place] at [facility]. I hope we have been able to provide you high quality care today.  You may receive a patient satisfaction survey over the next few weeks. We would appreciate your feedback and comments so that we may continue to improve ourselves and the health of our patients.

## 2021-01-01 ENCOUNTER — Ambulatory Visit (INDEPENDENT_AMBULATORY_CARE_PROVIDER_SITE_OTHER): Payer: 59 | Admitting: Nurse Practitioner

## 2021-01-01 ENCOUNTER — Encounter: Payer: Self-pay | Admitting: Nurse Practitioner

## 2021-01-01 ENCOUNTER — Other Ambulatory Visit: Payer: Self-pay

## 2021-01-01 ENCOUNTER — Telehealth: Payer: Self-pay | Admitting: *Deleted

## 2021-01-01 VITALS — BP 110/69 | HR 91 | Temp 97.4°F | Ht <= 58 in | Wt 141.0 lb

## 2021-01-01 DIAGNOSIS — D649 Anemia, unspecified: Secondary | ICD-10-CM

## 2021-01-01 DIAGNOSIS — Z8619 Personal history of other infectious and parasitic diseases: Secondary | ICD-10-CM

## 2021-01-01 DIAGNOSIS — R5383 Other fatigue: Secondary | ICD-10-CM

## 2021-01-01 DIAGNOSIS — J3089 Other allergic rhinitis: Secondary | ICD-10-CM

## 2021-01-01 DIAGNOSIS — E1165 Type 2 diabetes mellitus with hyperglycemia: Secondary | ICD-10-CM | POA: Diagnosis not present

## 2021-01-01 DIAGNOSIS — R1084 Generalized abdominal pain: Secondary | ICD-10-CM

## 2021-01-01 DIAGNOSIS — G8929 Other chronic pain: Secondary | ICD-10-CM

## 2021-01-01 DIAGNOSIS — J4520 Mild intermittent asthma, uncomplicated: Secondary | ICD-10-CM | POA: Diagnosis not present

## 2021-01-01 DIAGNOSIS — E559 Vitamin D deficiency, unspecified: Secondary | ICD-10-CM

## 2021-01-01 LAB — POCT GLYCOSYLATED HEMOGLOBIN (HGB A1C): Hemoglobin A1C: 5.7 % — AB (ref 4.0–5.6)

## 2021-01-01 LAB — POCT URINALYSIS DIP (CLINITEK)
Bilirubin, UA: NEGATIVE
Glucose, UA: NEGATIVE mg/dL
Ketones, POC UA: NEGATIVE mg/dL
Leukocytes, UA: NEGATIVE
Nitrite, UA: NEGATIVE
Spec Grav, UA: >=1.03 — AB (ref 1.010–1.025)
Urobilinogen, UA: 0.2 E.U./dL
pH, UA: 5.5 (ref 5.0–8.0)

## 2021-01-01 LAB — GLUCOSE, POCT (MANUAL RESULT ENTRY): POC Glucose: 97 mg/dl (ref 70–99)

## 2021-01-01 MED ORDER — POTASSIUM CHLORIDE CRYS ER 20 MEQ PO TBCR
20.0000 meq | EXTENDED_RELEASE_TABLET | Freq: Every day | ORAL | 11 refills | Status: DC
  Filled 2021-01-01 – 2021-05-18 (×2): qty 30, 30d supply, fill #0
  Filled 2021-05-18: qty 30, 30d supply, fill #1

## 2021-01-01 MED ORDER — DOCUSATE SODIUM 100 MG PO CAPS
100.0000 mg | ORAL_CAPSULE | Freq: Every day | ORAL | 11 refills | Status: AC
  Filled 2021-01-01: qty 30, 30d supply, fill #0

## 2021-01-01 NOTE — Patient Instructions (Signed)
???? ??????? Typhoid Fever ????? ??????? ??? ????? ??????? ??????????? ???????. ???? ??????? ?? ???? ????? ?? ???? ???? ????? ??????. ???? ?? ???? ????? ??????? ????? ?????? ?????? ??? ?? ???????? ??????? ??????. ???? ??? ??? ???????? ???? ????? ???????? ?? ????? ??????? ?? ???? ?????? ?????? ??????. ????? ??????? ?????? ?????? ????? ???? ?? ???? ???? ????? ????? ??????? ?? ???? ???? ?? ????? ???? ????? ?? ????? ??????? ??????? ????? ?? ??? ????. ???? ??? ????? ?? ?? ???? ???? ?????? ?????? ????? ?? ???? ?????? ??? ????????? ??? ????. ?? ????? ??????? ?? ???? ???????? ???? ??? ?????? ???? ????? ?????? ?? ??? ????? ?????? ???????? ??????????? ???????. ?? ????? ????? ?????? ?? ????? ??????? ??????? ???? ???? ??????? ???? ??????: ????? ?? ????? ??? ??? ????? ??? ????? ??????? ????. ????? ?? ????? ??? ????? ????? ?? ??? ????? ?????. ?? ?????? ?????? ?? ???????? ?? ????? ????? ????? ??????? ??????? ?? ???: ??????? ???????. ??? ???????? ???? ?????? ?? ?????. ?? ???? ????? ??? ??? ????? ????? ?????. ????? ?????? (???????). ???? ?? ??????? ?????. ?????? ?? ?????? ?????? ?????? ?? ?????. ??????. ????? ??????. ??? ????. ?????? ??????? ?????? ???: ???? ?????. ???????. ?????. ??????. ???????? ?? ???????. ?????? ?????. ???? ????? ?? ??????. ??? ????? ?????? ???? ????? ??? ?????? ???: ??? ????. ?? ?????? ?? ?????. ????? ????? ???????? ????? ????. ????? ?? ?????? ?? ?????. ?????? ????. ????? ???? ?? ???? ?????. ???? ??? ??????? ??? ?????? ?????? ?? ???? ??????? ???????. ??? ?????? ??? ??????? ???? ???? ??? ?????? ?????? ???? ????. ??? ??? ??? ??????? ??? ?? ???? ???? ?? ??? ??????. ??? ????? ??? ????????? ?? ??????: ????? ??????? ????? ????? ??????? ???? ????? ????? ???? ?? ??? ???? ???? ????? ??? ?????? ?? ???? ??????? ?????? ????? ??. ????? ???????? ??????? ??? ??????? ??????? ??????? ?????? ??????? ??. ????? ??? ?????? ?? ????? ?????? ?????? ??? ??? ????? ?????. ????? ??????  ???? ????  ?????? ????????: ??? ??????? ???????. ??? ?????? ??? ??? ??????? ???????. ??? ????? ????? ????. ??? ????? ??????. ??? ?????. ??????? ???? ????? ????? ??? ?????? ?????? ?? ???? ?? ???? ????? ?? ????? ?? ???? ??????. ??????? ???? ?? ??? ?????? ??????? ??? ???? ???? ??????? ???????? ??????: ????? ????????? ???????. ???? ????? ??? ????? ??? ?????? ?? ???? ???????. ?? ????? ?? ??? ???????? ???? ??????. ???? ??????? ??????? ??????? ?????? ???? ?????? ???? ??? ?????? ???????. ????? ???????? ????? ?????? ???????? ????? ???????? ???? ??????? ??????. ???? ??? ???. ??? ????? ??????? ?? ????  ????? ??????? ??????? ?????? ??? ????? ??? ??? ????? ??? ????? ??????? ???? ?????? ??? ????? ???? ????? ???????. ??? ?????? ?????? ?????? ???? ????? ?????? ??? ??? ?????. ???? ??????? ?????????? ??? ?????? ??? ????? ??? ????? ????? ??? ???? ???????: ?? ???? ??? ????????? ??????? ??????? ?? ?????? ?????? ?????. ?? ?????? ????? ?? ????????? ??? ????? ?????? ?? ??? ???? ?? ???? ?? ??????. ?? ?????? ??????? ?? ???????? ??????? ?? ?? ??? ?????? ????? ?????? ??? ??????? ?????. ?? ???? ?????? ?? ??????? ?? ????? ????? ?? ?????? ????? ?????? (????? ??????? ????? ?? ??????) ?? ??? ??? ?????. ???? ???? ??? ????? ????? ??? ???????. ?? ?????? ??? ??????? ???????? ????? ????????? ???????? ??? ??????? ????. ?? ?????? ????? ?? ??????? ?? ?????? ????????. ???? ?????? ??????. ??? ??????? ???????? ??????? ?????? ?? ??????? ???????: ?????? ??????? ???? ????? ????. ???? ????? ?????. ??????? ??????. ???? ???????? ??? ????? ?? ??????? ???????: ???? ????? ????? ?? ?????? ?????? ??? ???????. ???? ???? ?? ????? ????. ??? ?????? ??? ????? ?????? ?? ?????? ??? ????. ???? ????? ??????? ??? ????? ??????? ??????????? ???????. ???? ?? ???? ????? ??????? ????? ?????? ?????? ??? ?? ???????? ??????? ??????. ??? ???? ??? ???? ????? ?????? ?????? ?? ??? ???????. ???? ??? ?????? ???? ???? ????? ?????? ?? ??? ????? ?????? ????????  ??????????? ???????. ????? ???? ????? ??????? ????????? ???????. ????? ??? ?????? ?? ????? ?????? ?????? ??? ??? ????? ?????. ????? ??????? ??????? ?????? ??? ????? ??? ??? ????? ??? ????? ??????? ???? ?????? ??? ????? ???? ????? ???????. ??? ????? ?? ??? ????????? ?? ???? ?????? ????????? ???? ?????? ???? ??????? ??????. ???? ?? ?????? ??? ????? ???? ?? ???? ?? ???? ??????? ??????Marland Kitchen?  Document Revised: 06/05/2017 Document Reviewed: 06/05/2017 Elsevier Patient Education  2022 [employer].

## 2021-01-01 NOTE — Telephone Encounter (Signed)
Consent for Botox signed by both Jinny Blossom NP and patient. Charge sheet given to Botox coordinator and consent placed in medical records tray for scanning.
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
## 2021-01-02 ENCOUNTER — Other Ambulatory Visit: Payer: Self-pay

## 2021-01-02 LAB — IRON,TIBC AND FERRITIN PANEL
Ferritin: 20 ng/mL (ref 15–150)
Iron Saturation: 7 % — CL (ref 15–55)
Iron: 25 ug/dL — ABNORMAL LOW (ref 27–159)
Total Iron Binding Capacity: 344 ug/dL (ref 250–450)
UIBC: 319 ug/dL (ref 131–425)

## 2021-01-02 LAB — VITAMIN B12: Vitamin B-12: 511 pg/mL (ref 232–1245)

## 2021-01-02 LAB — VITAMIN D 25 HYDROXY (VIT D DEFICIENCY, FRACTURES): Vit D, 25-Hydroxy: 30 ng/mL (ref 30.0–100.0)

## 2021-01-05 NOTE — Telephone Encounter (Signed)
Received Botox charge sheet for 200 units of Botox ([id number]) for migraine (G43.719). Patient currently has [employer] insurance. I filled out their PA form and faxed to the plan with notes. Pending.

## 2021-01-07 NOTE — Telephone Encounter (Signed)
Received approval from insurance. PA #[id number] (01/05/21- 04/06/21). I will call patient to schedule.

## 2021-01-10 LAB — CDIFF NAA+O+P+STOOL CULTURE
E coli, Shiga toxin Assay: NEGATIVE
Toxigenic C. Difficile by PCR: NEGATIVE

## 2021-01-12 ENCOUNTER — Other Ambulatory Visit: Payer: Self-pay

## 2021-01-12 MED FILL — Fluticasone Furoate-Vilanterol Aero Powd BA 100-25 MCG/ACT: RESPIRATORY_TRACT | 30 days supply | Qty: 60 | Fill #0 | Status: CN

## 2021-01-12 NOTE — Telephone Encounter (Signed)
I called patient to discuss scheduling Botox injection. Patient states she needs more time to think about the injections. She states she will call back when/if ready.

## 2021-01-13 ENCOUNTER — Other Ambulatory Visit: Payer: Self-pay

## 2021-01-13 ENCOUNTER — Ambulatory Visit (INDEPENDENT_AMBULATORY_CARE_PROVIDER_SITE_OTHER): Payer: 59 | Admitting: Physician Assistant

## 2021-01-13 ENCOUNTER — Encounter (INDEPENDENT_AMBULATORY_CARE_PROVIDER_SITE_OTHER): Payer: Self-pay | Admitting: Physician Assistant

## 2021-01-13 VITALS — BP 103/70 | HR 107 | Temp 97.9°F | Ht <= 58 in | Wt 133.0 lb

## 2021-01-13 DIAGNOSIS — Z6831 Body mass index (BMI) 31.0-31.9, adult: Secondary | ICD-10-CM | POA: Diagnosis not present

## 2021-01-13 DIAGNOSIS — E669 Obesity, unspecified: Secondary | ICD-10-CM | POA: Diagnosis not present

## 2021-01-13 DIAGNOSIS — Z9189 Other specified personal risk factors, not elsewhere classified: Secondary | ICD-10-CM | POA: Diagnosis not present

## 2021-01-13 DIAGNOSIS — E1140 Type 2 diabetes mellitus with diabetic neuropathy, unspecified: Secondary | ICD-10-CM

## 2021-01-13 MED ORDER — SEMAGLUTIDE (1 MG/DOSE) 4 MG/3ML ~~LOC~~ SOPN
1.0000 mg | PEN_INJECTOR | SUBCUTANEOUS | 0 refills | Status: DC
  Filled 2021-01-13: qty 3, 28d supply, fill #0

## 2021-01-13 NOTE — Progress Notes (Signed)
Chief Complaint:   OBESITY Betty Cain is here to discuss her progress with her obesity treatment plan along with follow-up of her obesity related diagnoses. Betty Cain is on keeping a food journal and adhering to recommended goals of 1100-1200 calories and 80 grams of protein daily and states she is following her eating plan approximately 95% of the time. Betty Cain states she is doing 0 minutes 0 times per week.  Today's visit was #: 15 Starting weight: 152 lbs Starting date: 01/10/2020 Today's weight: 133 lbs Today's date: 01/13/2021 Total lbs lost to date: 19 Total lbs lost since last in-office visit: 5  Interim History: Betty Cain is doing well with weight loss. She is eating a lot of veggies, fruit, and meat with lunch and dinner. She is tired today and recently was diagnosed with anemia by her primary care provider.  Subjective:   1. Type 2 diabetes mellitus with diabetic neuropathy, without long-term current use of insulin (HCC) Betty Cain is on Ozempic 1 mg. Her last A1c was 5.7, and her appetite is controlled most days.  2. At risk for hypoglycemia Betty Cain is at increased risk for hypoglycemia due to changes in diet, diagnosis of diabetes, and/or insulin use.   Assessment/Plan:   1. Type 2 diabetes mellitus with diabetic neuropathy, without long-term current use of insulin (HCC) Betty Cain will continue Ozempic 1 mg and we will refill for 1 month. Good blood sugar control is important to decrease the likelihood of diabetic complications such as nephropathy, neuropathy, limb loss, blindness, coronary artery disease, and death. Intensive lifestyle modification including diet, exercise and weight loss are the first line of treatment for diabetes.   - Semaglutide, 1 MG/DOSE, 4 MG/3ML SOPN; Inject 1 mg as directed once a week.  Dispense: 3 mL; Refill: 0  2. At risk for hypoglycemia Betty Cain was given approximately 15 minutes of counseling today regarding prevention of hypoglycemia. She was advised of symptoms of  hypoglycemia. Betty Cain was instructed to avoid skipping meals, eat regular protein rich meals and schedule low calorie snacks as needed.   Repetitive spaced learning was employed today to elicit superior memory formation and behavioral change  3. Obesity: Current BMI 27.8 Betty Cain is currently in the action stage of change. As such, her goal is to continue with weight loss efforts. She has agreed to the Category 1 Plan.   Exercise goals: No exercise has been prescribed at this time.  Behavioral modification strategies: increasing lean protein intake and meal planning and cooking strategies.  Betty Cain has agreed to follow-up with our clinic in 4 weeks. She was informed of the importance of frequent follow-up visits to maximize her success with intensive lifestyle modifications for her multiple health conditions.   Objective:   Blood pressure 103/70, pulse (!) 107, temperature 97.9 F (36.6 C), height 4\' 10"  (1.473 m), weight 133 lb (60.3 kg), SpO2 99 %. Body mass index is 27.8 kg/m.  General: Cooperative, alert, well developed, in no acute distress. HEENT: Conjunctivae and lids unremarkable. Cardiovascular: Regular rhythm.  Lungs: Normal work of breathing. Neurologic: No focal deficits.   Lab Results  Component Value Date   CREATININE 0.60 12/22/2020   BUN 9 12/22/2020   NA 139 12/22/2020   K 4.2 12/22/2020   CL 102 12/22/2020   CO2 20 12/22/2020   Lab Results  Component Value Date   ALT 11 12/22/2020   AST 14 12/22/2020   ALKPHOS 89 12/22/2020   BILITOT 0.3 12/22/2020   Lab Results  Component Value Date  HGBA1C 5.7 (A) 01/01/2021   HGBA1C 6.7 (H) 09/29/2020   HGBA1C 5.7 (A) 06/13/2020   HGBA1C 7.5 (A) 01/02/2020   HGBA1C 7.5 01/02/2020   Lab Results  Component Value Date   INSULIN 8.7 01/10/2020   Lab Results  Component Value Date   TSH 1.430 12/22/2020   Lab Results  Component Value Date   CHOL 209 (H) 12/22/2020   HDL 32 (L) 12/22/2020   LDLCALC 152 (H) 12/22/2020    TRIG 135 12/22/2020   CHOLHDL 6.5 (H) 12/22/2020   Lab Results  Component Value Date   VD25OH 30.0 01/01/2021   VD25OH 42.6 09/29/2020   VD25OH 22.9 (L) 01/10/2020   Lab Results  Component Value Date   WBC 7.0 12/22/2020   HGB 11.5 12/22/2020   HCT 35.5 12/22/2020   MCV 82 12/22/2020   PLT 424 12/22/2020   Lab Results  Component Value Date   IRON 25 (L) 01/01/2021   TIBC 344 01/01/2021   FERRITIN 20 01/01/2021   Attestation Statements:   Reviewed by clinician on day of visit: allergies, medications, problem list, medical history, surgical history, family history, social history, and previous encounter notes.   Wilhemena Durie, am acting as transcriptionist for [employer], PA-C.  I have reviewed the above documentation for accuracy and completeness, and I agree with the above. Abby Potash, PA-C

## 2021-01-21 ENCOUNTER — Other Ambulatory Visit: Payer: Self-pay

## 2021-01-22 ENCOUNTER — Other Ambulatory Visit: Payer: Self-pay

## 2021-01-23 ENCOUNTER — Other Ambulatory Visit: Payer: Self-pay

## 2021-01-26 ENCOUNTER — Encounter (HOSPITAL_COMMUNITY): Payer: 59

## 2021-01-29 ENCOUNTER — Other Ambulatory Visit (INDEPENDENT_AMBULATORY_CARE_PROVIDER_SITE_OTHER): Payer: Self-pay | Admitting: Physician Assistant

## 2021-01-29 ENCOUNTER — Other Ambulatory Visit: Payer: Self-pay

## 2021-01-29 DIAGNOSIS — D509 Iron deficiency anemia, unspecified: Secondary | ICD-10-CM | POA: Insufficient documentation

## 2021-01-29 DIAGNOSIS — E559 Vitamin D deficiency, unspecified: Secondary | ICD-10-CM

## 2021-01-29 MED ORDER — VITAMIN D (ERGOCALCIFEROL) 1.25 MG (50000 UNIT) PO CAPS
ORAL_CAPSULE | ORAL | 0 refills | Status: DC
Start: 2021-01-29 — End: 2021-03-09
  Filled 2021-01-29: qty 4, 28d supply, fill #0

## 2021-01-29 NOTE — Telephone Encounter (Signed)
Last seen Tracey 

## 2021-01-29 NOTE — Assessment & Plan Note (Signed)
Feraheme 987 mg IVPB per policy Follow up in office for reevaluation Will resume treatment

## 2021-01-29 NOTE — Telephone Encounter (Signed)
LAST APPOINTMENT DATE: 01/13/21 NEXT APPOINTMENT DATE: 02/10/21   [facility] [place] [address] Phone: [phone] Fax: [phone]  [facility] ([place]), [place] - [address] [place] ([place]) [place] [address] Phone: [phone] Fax: [phone]  Patient is requesting a refill of the following medications: Pending Prescriptions:                       Disp   Refills   Vitamin D, Ergocalciferol, (DRISDOL) 1.25 *4 caps*0       Sig: TAKE 1 CAPSULE (50,000 UNITS TOTAL) BY MOUTH EVERY 7          (SEVEN) DAYS.   Date last filled: 12/23/20 Previously prescribed by Linus Orn  Lab Results      Component                Value               Date                      HGBA1C                   5.7 (A)             01/01/2021                HGBA1C                   6.7 (H)             09/29/2020                HGBA1C                   5.7 (A)             06/13/2020           Lab Results      Component                Value               Date                      LDLCALC                  152 (H)             12/22/2020                CREATININE               0.60                12/22/2020           Lab Results      Component                Value               Date                      VD25OH                   30.0                01/01/2021  VD25OH                   42.6                09/29/2020                VD25OH                   22.9 (L)            01/10/2020            BP Readings from Last 3 Encounters: 01/13/21 : 103/70 01/01/21 : 110/69 12/29/20 : 115/82

## 2021-01-30 ENCOUNTER — Other Ambulatory Visit: Payer: Self-pay

## 2021-01-30 ENCOUNTER — Other Ambulatory Visit: Payer: Self-pay | Admitting: Nurse Practitioner

## 2021-01-30 ENCOUNTER — Non-Acute Institutional Stay (HOSPITAL_COMMUNITY)
Admission: RE | Admit: 2021-01-30 | Discharge: 2021-01-30 | Disposition: A | Discharge status: Home / Self Care | Payer: 59 | Source: Ambulatory Visit | Attending: Internal Medicine | Admitting: Internal Medicine

## 2021-01-30 DIAGNOSIS — J3089 Other allergic rhinitis: Secondary | ICD-10-CM

## 2021-01-30 DIAGNOSIS — D509 Iron deficiency anemia, unspecified: Secondary | ICD-10-CM

## 2021-01-30 DIAGNOSIS — D649 Anemia, unspecified: Secondary | ICD-10-CM | POA: Diagnosis not present

## 2021-01-30 MED ORDER — SODIUM CHLORIDE 0.9 % IV SOLN: INTRAVENOUS | Status: DC | PRN

## 2021-01-30 MED ORDER — SODIUM CHLORIDE 0.9 % IV SOLN
510.0000 mg | Freq: Once | INTRAVENOUS | Status: AC
  Administered 2021-01-30: 510 mg via INTRAVENOUS
  Filled 2021-01-30: qty 17

## 2021-01-30 NOTE — Progress Notes (Signed)
PATIENT CARE CENTER NOTE   Diagnosis: Anemia    Provider: Dionisio David, NP   Procedure: Feraheme infusion    Note:  Patient received Feraheme infusion (dose 1 of 2) via PIV. Tolerated well with no adverse reaction. Observed patient for 30 minutes post infusion. Vital signs stable. Discharge instructions given. Patient to come back on 02/09/21 for second infusion. Patient alert, oriented and ambulatory at discharge.

## 2021-02-03 ENCOUNTER — Other Ambulatory Visit: Payer: Self-pay | Admitting: Nurse Practitioner

## 2021-02-03 ENCOUNTER — Other Ambulatory Visit: Payer: Self-pay

## 2021-02-03 MED FILL — Levocetirizine Dihydrochloride Tab 5 MG: ORAL | 30 days supply | Qty: 30 | Fill #4 | Status: AC

## 2021-02-04 ENCOUNTER — Other Ambulatory Visit: Payer: Self-pay

## 2021-02-04 MED ORDER — ONDANSETRON 8 MG PO TBDP
ORAL_TABLET | ORAL | 0 refills | Status: DC
  Filled 2021-02-04: qty 30, 10d supply, fill #0

## 2021-02-05 ENCOUNTER — Other Ambulatory Visit: Payer: Self-pay

## 2021-02-06 ENCOUNTER — Other Ambulatory Visit: Payer: Self-pay

## 2021-02-06 ENCOUNTER — Ambulatory Visit (INDEPENDENT_AMBULATORY_CARE_PROVIDER_SITE_OTHER): Payer: 59 | Admitting: Gastroenterology

## 2021-02-06 VITALS — BP 110/60 | HR 94 | Ht <= 58 in | Wt 137.6 lb

## 2021-02-06 DIAGNOSIS — D509 Iron deficiency anemia, unspecified: Secondary | ICD-10-CM

## 2021-02-06 DIAGNOSIS — R1013 Epigastric pain: Secondary | ICD-10-CM

## 2021-02-06 DIAGNOSIS — K219 Gastro-esophageal reflux disease without esophagitis: Secondary | ICD-10-CM

## 2021-02-06 DIAGNOSIS — K5909 Other constipation: Secondary | ICD-10-CM

## 2021-02-06 DIAGNOSIS — R6881 Early satiety: Secondary | ICD-10-CM

## 2021-02-06 DIAGNOSIS — Z791 Long term (current) use of non-steroidal anti-inflammatories (NSAID): Secondary | ICD-10-CM

## 2021-02-06 MED ORDER — METOCLOPRAMIDE HCL 5 MG PO TABS
5.0000 mg | ORAL_TABLET | Freq: Three times a day (TID) | ORAL | 0 refills | Status: DC
  Filled 2021-02-06: qty 90, 30d supply, fill #0

## 2021-02-06 MED ORDER — DEXLANSOPRAZOLE 60 MG PO CPDR
60.0000 mg | DELAYED_RELEASE_CAPSULE | Freq: Every day | ORAL | 1 refills | Status: DC
  Filled 2021-02-06 (×2): qty 30, 30d supply, fill #0

## 2021-02-06 MED ORDER — TRULANCE 3 MG PO TABS: 3.0000 mg | ORAL_TABLET | Freq: Every day | ORAL | 0 refills | Status: DC

## 2021-02-06 NOTE — Patient Instructions (Addendum)
If you are age 43 or older, your body mass index should be between 23-30. Your Body mass index is 28.76 kg/m. If this is out of the aforementioned range listed, please consider follow up with your Primary Care Provider.  If you are age 10 or younger, your body mass index should be between 19-25. Your Body mass index is 28.76 kg/m. If this is out of the aformentioned range listed, please consider follow up with your Primary Care Provider.   ________________________________________________________  The [facility] providers would like to encourage you to use [facility] to communicate with providers for non-urgent requests or questions.  Due to long hold times on the telephone, sending your provider a message by [facility] may be a faster and more efficient way to get a response.  Please allow 48 business hours for a response.  Please remember that this is for non-urgent requests.  _______________________________________________________  Stop Meloxicam and  NSAIDs.  We have given you samples of the following medication to take: Trulance 3 mg: Take once daily  We have sent the following medications to your pharmacy for you to pick up at your convenience: Dexilant 60 mg: Take once daily  Reglan 5 mg: Take three times a day before meals  We will contact you about scheduling am upper Endoscopy.  Thank you for entrusting me with your care and for choosing [facility], Dr. Chilchinbito Cellar     .

## 2021-02-06 NOTE — Progress Notes (Signed)
HPI :  45 year old female here for follow-up visit for dyspepsia, multiple bowel symptoms.  I have not seen her since May 2021.  Recall we have seen her multiple times in the past few years. She speaks Arabic and is accompanied by a Optometrist today.   Previously had an H. Pylori IgG serology in April 2019  and was treated for that. He had recurrence of symptoms after treatment, was placed on Protonix and scheduled for an endoscopy with me in September. She had an EGD on January 04 2018. She had some mild gastritis noted, unfortunately her procedure was aborted due to significant laryngospasm and oxygen desaturation. Unfortunately given the procedure was aborted, biopsies not taken to confirm H. Pylori eradication. We stopped her Protonix and she submitted a stool study which was negative for H. Pylori. She's also had ongoing intermittent chest pains and has seen cardiology. Had prior EST which was normal and a negative nuclear stress test in 2020.   Her main complaint previously was ongoing abdominal pain. She has had an extensive evaluation with labs, US abdomen, CT scan abdomen, colonoscopy, none of which showed any pathology.  She has had some elevated inflammatory markers, but negative for any evidence of IBD. She tested negative for celiac disease.  He denies any fevers historically.    She has been tried on a variety of regimens to include Carafate, Protonix, gabapentin, topical capsaicin cream, Bentyl.  He has also been on nortriptyline 25 mg nightly.    She has had constipation in the past and placed on Linzess and MiraLAX previously as well.    Since I have last seen her she has been taking Protonix twice daily for her dyspepsia.  She states this certainly helps her heartburn however she continues to still feel this periodically.  Her main complaint is ongoing epigastric discomfort.  She states she was traveling to [place] this past summer for 1 to 2 months.  There was a concern at 1 point  time if she had typhoid fever, she was tested for this and was in negative.  Since her return from today and she continues to have some intermittent upper abdominal discomfort.  She feels full easily and nauseated, with belching of foul-smelling contents.  She continues to have some poor appetite.  She takes Carafate as needed which she thinks does help.  She has a variety of aches and pains for which she takes meloxicam every day.  She states she is been on this for some time.  She is not drinking any alcohol.  She has no new bowel habit changes.  She has a baseline constipation.  No blood in her stools that she endorses.  She has been using over-the-counter laxatives, she is not too happy with the regimen for now.  She has not been on MiraLAX or Linzess more recently.  She has been on Ozempic for the past year and states she thinks she tolerates it okay.  Of note she was noted to have an iron deficiency anemia since her last visit.  Labs as resulted below.  She has been referred for IV iron which she received 1 dose of and now taking oral iron supplementation.  He is also had a right upper quadrant ultrasound in 2021 since I have seen her and that did not show any evidence of gallstones.  She states she has not had menstrual cycles to cause any anemia.       Prior workup: CT abdomen pelvis 03/16/18 - normal  [place] 01/15/14 - gallbladder normal EGD 01/04/18 - significant desaturation due to laryngospasm and procedure was aborted, mild gastriits, esophagus not well evaluated     Colonoscopy 11/23/18 - The perianal and digital rectal examinations were normal. - The terminal ileum appeared normal. - The left colon was extremely tortuous. - A 3 mm polyp was found in the transverse colon. The polyp was sessile. The polyp was removed with a cold snare. Resection and retrieval were complete. - Internal hemorrhoids were found during retroflexion. - The exam was otherwise without abnormality. No obvious  inflammatory changes. Of note, given the breathing pattern observed under anesthesia for this case, high suspicion for sleep Apnea. Path shows serrated polyp - repeat colonoscopy in 5 years    Labs 01/01/21: Iron 25, TIBC 344, iron sat 7%, ferritin 20 Hgb 11.5, MCV 82  Hgb 10.6 to 11.8 from 3/21/ to 3/22   C diff ova/parasites/stool culture negative 01/02/21   RUQ [place] 06/19/19: IMPRESSION: 1. Negative for gallstones or biliary dilatation 2. Echogenic liver as may be seen with hepatic steatosis.  A1c 5.7    Past Medical History:  Diagnosis Date   Allergy    Anemia    Anxiety 01/2019   Asthma    B12 deficiency    Back pain    Common migraine with intractable migraine 06/28/2017   Constipation    Diabetes ([place]) 02/2019   Fatigue    GERD (gastroesophageal reflux disease)    pos H pylori   Hypertension    controlled with medication   IBS (irritable bowel syndrome) 2005   Joint pain    Neonatal death    Vaginal delivery, full term-lived x2 hours.    Palpitations    Shortness of breath    Shortness of breath on exertion    Spinal headache    Swelling of both lower extremities    Valvular heart disease    Vitamin D deficiency 02/2019     Past Surgical History:  Procedure Laterality Date   ADENOIDECTOMY     and tonsils (as a child)   boil  2003   right elbow   CESAREAN SECTION     x4   CESAREAN SECTION  06/02/2011   Procedure: CESAREAN SECTION;  Surgeon: [name], MD;  Location: [facility];  Service: Gynecology;  Laterality: N/A;  Primary Cesarean Section Delivery Baby Boy @ 0004, Apgars 9/9   CESAREAN SECTION N/A 12/10/2013   Procedure: REPEAT CESAREAN SECTION;  Surgeon: [name], MD;  Location: [facility];  Service: Obstetrics;  Laterality: N/A;   CESAREAN SECTION     colonoscopy  11/23/2018   stated had issues with sleep when in for procedure   OTHER SURGICAL HISTORY  2005   Uterine surgery    uterine cauterization     Family History  Problem Relation  Age of Onset   Diabetes Mother    Diabetes Father    Diabetes Sister    Anesthesia problems Neg Hx    Colon cancer Neg Hx    Esophageal cancer Neg Hx    Rectal cancer Neg Hx    Stomach cancer Neg Hx    Colon polyps Neg Hx    Social History   Tobacco Use   Smoking status: Never   Smokeless tobacco: Never  Vaping Use   Vaping Use: Never used  Substance Use Topics   Alcohol use: No   Drug use: No   Current Outpatient Medications  Medication Sig Dispense Refill   acetaminophen (  TYLENOL) 500 MG tablet Take 500 mg by mouth every 6 (six) hours as needed.     albuterol (VENTOLIN HFA) 108 (90 Base) MCG/ACT inhaler Inhale 2 puffs into the lungs every 6 (six) hours as needed for wheezing or shortness of breath. 8 g 2   Blood Pressure Monitoring DEVI 1 each by Does not apply route daily. 1 Device 0   docusate sodium (COLACE) 100 MG capsule Take 1 capsule (100 mg total) by mouth daily. 30 capsule 11   ferrous sulfate (FEROSUL) 325 (65 FE) MG tablet TAKE 1 TABLET (325 MG TOTAL) BY MOUTH DAILY. 90 tablet 3   fluticasone (FLONASE) 50 MCG/ACT nasal spray PLACE 2 SPRAYS INTO BOTH NOSTRILS DAILY. 16 g 11   fluticasone furoate-vilanterol (BREO ELLIPTA) 100-25 MCG/INH AEPB INHALE 1 PUFF INTO THE LUNGS DAILY. 60 each 3   Fremanezumab-vfrm 225 MG/1.5ML SOAJ INJECT 225 MG INTO THE SKIN EVERY 30 (THIRTY) DAYS. 4.5 mL 3   gabapentin (NEURONTIN) 300 MG capsule TAKE 1 CAPSULE BY MOUTH IN THE MORNING AND 2 CAPSULES IN THE EVENING. 270 capsule 3   glucose blood (ACCU-CHEK GUIDE) test strip Use as instructed 100 each 12   levocetirizine (XYZAL) 5 MG tablet TAKE 1 TABLET (5 MG TOTAL) BY MOUTH EVERY EVENING. 90 tablet 3   meloxicam (MOBIC) 15 MG tablet TAKE 1 TABLET (15 MG TOTAL) BY MOUTH DAILY. 30 tablet 11   metFORMIN (GLUCOPHAGE-XR) 500 MG 24 hr tablet TAKE 1 TABLET (500 MG TOTAL) BY MOUTH 2 (TWO) TIMES DAILY. 180 tablet 0   methylcellulose (CITRUCEL) oral powder Take as directed, daily     metoprolol  succinate (TOPROL-XL) 100 MG 24 hr tablet Take 1 tablet (100 mg total) by mouth daily. 90 tablet 0   Multiple Vitamin (MULTI VITAMIN DAILY PO) Take by mouth.     nortriptyline (PAMELOR) 25 MG capsule Take 1 capsule (25 mg total) by mouth at bedtime. 90 capsule 3   ondansetron (ZOFRAN-ODT) 8 MG disintegrating tablet TAKE 1 TABLET BY MOUTH EVERY 8 HOURS AS NEEDED FOR NAUSEA OR VOMITING. 30 tablet 0   OneTouch Delica Lancets 71I MISC 1 application by Does not apply route in the morning, at noon, in the evening, and at bedtime. 100 each 11   pantoprazole (PROTONIX) 40 MG tablet TAKE 1 TABLET BY MOUTH 2 TIMES DAILY BEFORE A MEAL. 180 tablet 1   potassium chloride SA (KLOR-CON) 20 MEQ tablet Take 1 tablet (20 mEq total) by mouth daily. 30 tablet 11   Rimegepant Sulfate 75 MG TBDP Take 1 tab at onset of migraine. May repeat in 2 hrs, if needed. Max dose: 2 tabs/day or 15/month. This is a 90 day rx. 45 tablet 3   rosuvastatin (CRESTOR) 5 MG tablet TAKE 1 TABLET (5 MG TOTAL) BY MOUTH AT BEDTIME. 60 tablet 11   Semaglutide, 1 MG/DOSE, 4 MG/3ML SOPN Inject 1 mg as directed once a week. 3 mL 0   sucralfate (CARAFATE) 1 GM/10ML suspension TAKE 10 MLS (1 G TOTAL) BY MOUTH EVERY 6 (SIX) HOURS AS NEEDED. 420 mL 2   tiZANidine (ZANAFLEX) 2 MG tablet Take 1 tablet (2 mg total) by mouth every 8 (eight) hours as needed for muscle spasms. Will need follow up for further refills.  12/29/2020 20 tablet 1   vitamin B-12 (CYANOCOBALAMIN) 1000 MCG tablet Take 5 tablets (5,000 mcg total) by mouth daily. 450 tablet 3   Vitamin D, Ergocalciferol, (DRISDOL) 1.25 MG (50000 UNIT) CAPS capsule TAKE 1 CAPSULE (50,000 UNITS TOTAL)  BY MOUTH EVERY 7 (SEVEN) DAYS. 4 capsule 0   No current facility-administered medications for this visit.   Allergies  Allergen Reactions   Topamax [Topiramate] Itching and Rash     Review of Systems: All systems reviewed and negative except where noted in HPI.    Labs Per HPI  Lab Results   Component Value Date   WBC 7.0 12/22/2020   HGB 11.5 12/22/2020   HCT 35.5 12/22/2020   MCV 82 12/22/2020   PLT 424 12/22/2020    Lab Results  Component Value Date   IRON 25 (L) 01/01/2021   TIBC 344 01/01/2021   FERRITIN 20 01/01/2021   Lab Results  Component Value Date   ALT 11 12/22/2020   AST 14 12/22/2020   ALKPHOS 89 12/22/2020   BILITOT 0.3 12/22/2020     Physical Exam: BP 110/60   Pulse 94   Ht 4\' 10"  (1.473 m)   Wt 137 lb 9.6 oz (62.4 kg)   BMI 28.76 kg/m  Constitutional: Pleasant,well-developed, female in no acute distress. Abdominal: Soft, nondistended, nontender.There are no masses palpable.  Extremities: no edema Neurological: Alert and oriented to person place and time. Skin: Skin is warm and dry. No rashes noted. Psychiatric: Normal mood and affect. Behavior is normal.   ASSESSMENT AND PLAN: 45 year old female here for reassessment of the following:  Dyspepsia GERD Early satiety Iron deficiency anemia Chronic constipation NSAID use  As above, patient has had extensive evaluation for the symptoms.  Since have last seen her she has been doing much better in regards to her upper abdominal discomfort but dyspepsia appears to have recurred, now with early satiety and some worsening reflux, also noted to have iron deficiency anemia.  Colonoscopy is up-to-date.  She is using NSAIDs routinely and this could be causing gastritis/PUD or enteropathy.  Recommend she completely stop using NSAIDs and counseled her extensively on this.  Hopefully this measure helps her.  She continues to have some reflux despite twice daily Protonix, we discussed other regimens.  We will see if we can switch her to Dexilant 60 mg/day if insurance will cover it.  Ultimately given her ongoing dyspepsia and recent iron deficiency, in light of the symptoms I do think another EGD would be useful as the last exam was aborted due to laryngospasm.  The question is, in this light, where  should this exam be done and if she needed to be out of the hospital.  I will reach out to our anesthesia staff and discuss her case without to determine if her case needs to be done at the hospital.  I discussed EGD with the patient, she is not certain she wants to repeat this given the occurrence of the last exam and wants to think about it more.  In the interim I discussed in light of her diabetes she is at risk for gastroparesis.  Discussed options and we will try her on Reglan 5 mg 3 times daily prior to meals to see if that helps.  I discussed risks for tar dive dyskinesia which is extremely low especially at this dose.  In the interim we had some free samples of Trulance in the office I will give her a try for constipation and see if that works for her.  We will touch base with her about recommendations regarding EGD and will see if and when she wants to proceed with this at some point in time.  Plan: - stop meloxicam and all NSAIDs - trial  of Trulance daily for constipation - free sample given - trial of switching protonix to Dexilant 60mg / day - trial of Reglan 5mg  TID prior to meals - 1 month supply, discussed risks - in light of IDA recommend EGD given limitations of the last exam. I will discuss her case with our anesthesia team and get back to her. She currently is not sure if she is willing to do this, will give her some time to think about it. Colonoscopy up to date.  Jolly Mango, MD [facility]

## 2021-02-09 ENCOUNTER — Other Ambulatory Visit: Payer: Self-pay

## 2021-02-09 ENCOUNTER — Non-Acute Institutional Stay (HOSPITAL_COMMUNITY)
Admission: RE | Admit: 2021-02-09 | Discharge: 2021-02-09 | Disposition: A | Discharge status: Home / Self Care | Payer: 59 | Source: Ambulatory Visit | Attending: Internal Medicine | Admitting: Internal Medicine

## 2021-02-09 DIAGNOSIS — D509 Iron deficiency anemia, unspecified: Secondary | ICD-10-CM | POA: Diagnosis not present

## 2021-02-09 MED ORDER — SODIUM CHLORIDE 0.9 % IV SOLN: INTRAVENOUS | Status: DC | PRN

## 2021-02-09 MED ORDER — SODIUM CHLORIDE 0.9 % IV SOLN
510.0000 mg | Freq: Once | INTRAVENOUS | Status: AC
  Administered 2021-02-09: 510 mg via INTRAVENOUS
  Filled 2021-02-09: qty 510

## 2021-02-09 NOTE — Progress Notes (Signed)
PATIENT CARE CENTER NOTE     Diagnosis: Anemia      Provider: Dionisio David, NP     Procedure: Feraheme infusion      Note:  Patient received Feraheme infusion (dose 2 of 2) via PIV. Tolerated well with no adverse reaction. Observed patient for 30 minutes post infusion. Vital signs stable. Discharge instructions given.  Patient alert, oriented and ambulatory at discharge.

## 2021-02-10 ENCOUNTER — Encounter (INDEPENDENT_AMBULATORY_CARE_PROVIDER_SITE_OTHER): Payer: Self-pay | Admitting: Physician Assistant

## 2021-02-10 ENCOUNTER — Other Ambulatory Visit: Payer: Self-pay

## 2021-02-10 ENCOUNTER — Ambulatory Visit (INDEPENDENT_AMBULATORY_CARE_PROVIDER_SITE_OTHER): Payer: 59 | Admitting: Physician Assistant

## 2021-02-10 VITALS — BP 111/77 | HR 100 | Temp 98.5°F | Ht <= 58 in | Wt 131.0 lb

## 2021-02-10 DIAGNOSIS — E669 Obesity, unspecified: Secondary | ICD-10-CM

## 2021-02-10 DIAGNOSIS — Z6831 Body mass index (BMI) 31.0-31.9, adult: Secondary | ICD-10-CM

## 2021-02-10 DIAGNOSIS — Z9189 Other specified personal risk factors, not elsewhere classified: Secondary | ICD-10-CM

## 2021-02-10 DIAGNOSIS — E1140 Type 2 diabetes mellitus with diabetic neuropathy, unspecified: Secondary | ICD-10-CM

## 2021-02-10 MED ORDER — OZEMPIC (2 MG/DOSE) 8 MG/3ML ~~LOC~~ SOPN
2.0000 mg | PEN_INJECTOR | SUBCUTANEOUS | 0 refills | Status: DC
Start: 2021-02-10 — End: 2021-02-11
  Filled 2021-02-10: qty 3, 28d supply, fill #0

## 2021-02-10 NOTE — Progress Notes (Signed)
Chief Complaint:   OBESITY Betty Cain is here to discuss her progress with her obesity treatment plan along with follow-up of her obesity related diagnoses. Betty Cain is on keeping a food journal and adhering to recommended goals of 1100-1200 calories and 80 grams protein and states she is following her eating plan approximately 95% of the time. Betty Cain states she is not currently exercising.  Today's visit was #: 16 Starting weight: 152 lbs Starting date: 01/10/2020 Today's weight: 131 lbs Today's date: 02/10/2021 Total lbs lost to date: 21 Total lbs lost since last in-office visit: 2  Interim History: Betty Cain continues to do well with weight loss. She reports Ozempic helps with appetite control but she feels that she continues to overeat some days.  Subjective:   1. Type 2 diabetes mellitus with diabetic neuropathy, without long-term current use of insulin (HCC) Betty Cain is on Ozempic 1 mg. Her last A1c was 5.7. She is also on Metformin. No hypoglycemia. Pt feels weak when her blood sugar is 85-90.  2. At risk for heart disease Betty Cain is at a higher than average risk for cardiovascular disease due to obesity.   Assessment/Plan:   1. Type 2 diabetes mellitus with diabetic neuropathy, without long-term current use of insulin (HCC) Increase Ozempic to 2 mg. Good blood sugar control is important to decrease the likelihood of diabetic complications such as nephropathy, neuropathy, limb loss, blindness, coronary artery disease, and death. Intensive lifestyle modification including diet, exercise and weight loss are the first line of treatment for diabetes.   Increase and Refill- Semaglutide, 2 MG/DOSE, (OZEMPIC, 2 MG/DOSE,) 8 MG/3ML SOPN; Inject 2 mg as directed once a week.  Dispense: 3 mL; Refill: 0  2. At risk for heart disease Betty Cain was given approximately 15 minutes of coronary artery disease prevention counseling today. She is 45 y.o. female and has risk factors for heart disease including obesity. We  discussed intensive lifestyle modifications today with an emphasis on specific weight loss instructions and strategies.   Repetitive spaced learning was employed today to elicit superior memory formation and behavioral change.  3. Obesity: Current BMI 27.39  Betty Cain is currently in the action stage of change. As such, her goal is to continue with weight loss efforts. She has agreed to the Category 2 Plan.   Exercise goals:  As is  Behavioral modification strategies: meal planning and cooking strategies and planning for success.  Betty Cain has agreed to follow-up with our clinic in 4 weeks. She was informed of the importance of frequent follow-up visits to maximize her success with intensive lifestyle modifications for her multiple health conditions.   Objective:   Blood pressure 111/77, pulse 100, temperature 98.5 F (36.9 C), height 4\' 10"  (1.473 m), weight 131 lb (59.4 kg), SpO2 100 %. Body mass index is 27.38 kg/m.  General: Cooperative, alert, well developed, in no acute distress. HEENT: Conjunctivae and lids unremarkable. Cardiovascular: Regular rhythm.  Lungs: Normal work of breathing. Neurologic: No focal deficits.   Lab Results  Component Value Date   CREATININE 0.60 12/22/2020   BUN 9 12/22/2020   NA 139 12/22/2020   K 4.2 12/22/2020   CL 102 12/22/2020   CO2 20 12/22/2020   Lab Results  Component Value Date   ALT 11 12/22/2020   AST 14 12/22/2020   ALKPHOS 89 12/22/2020   BILITOT 0.3 12/22/2020   Lab Results  Component Value Date   HGBA1C 5.7 (A) 01/01/2021   HGBA1C 6.7 (H) 09/29/2020   HGBA1C 5.7 (  A) 06/13/2020   HGBA1C 7.5 (A) 01/02/2020   HGBA1C 7.5 01/02/2020   Lab Results  Component Value Date   INSULIN 8.7 01/10/2020   Lab Results  Component Value Date   TSH 1.430 12/22/2020   Lab Results  Component Value Date   CHOL 209 (H) 12/22/2020   HDL 32 (L) 12/22/2020   LDLCALC 152 (H) 12/22/2020   TRIG 135 12/22/2020   CHOLHDL 6.5 (H) 12/22/2020    Lab Results  Component Value Date   VD25OH 30.0 01/01/2021   VD25OH 42.6 09/29/2020   VD25OH 22.9 (L) 01/10/2020   Lab Results  Component Value Date   WBC 7.0 12/22/2020   HGB 11.5 12/22/2020   HCT 35.5 12/22/2020   MCV 82 12/22/2020   PLT 424 12/22/2020   Lab Results  Component Value Date   IRON 25 (L) 01/01/2021   TIBC 344 01/01/2021   FERRITIN 20 01/01/2021    Attestation Statements:   Reviewed by clinician on day of visit: allergies, medications, problem list, medical history, surgical history, family history, social history, and previous encounter notes.  Coral Ceo, CMA, am acting as transcriptionist for [employer], PA-C.  I have reviewed the above documentation for accuracy and completeness, and I agree with the above. Abby Potash, PA-C

## 2021-02-11 ENCOUNTER — Other Ambulatory Visit (HOSPITAL_COMMUNITY): Payer: Self-pay

## 2021-02-11 ENCOUNTER — Encounter (INDEPENDENT_AMBULATORY_CARE_PROVIDER_SITE_OTHER): Payer: Self-pay | Admitting: Emergency Medicine

## 2021-02-11 ENCOUNTER — Other Ambulatory Visit (INDEPENDENT_AMBULATORY_CARE_PROVIDER_SITE_OTHER): Payer: Self-pay | Admitting: Emergency Medicine

## 2021-02-11 DIAGNOSIS — E1140 Type 2 diabetes mellitus with diabetic neuropathy, unspecified: Secondary | ICD-10-CM

## 2021-02-11 MED ORDER — OZEMPIC (2 MG/DOSE) 8 MG/3ML ~~LOC~~ SOPN
2.0000 mg | PEN_INJECTOR | SUBCUTANEOUS | 0 refills | Status: DC
Start: 2021-02-11 — End: 2021-03-09
  Filled 2021-02-11: qty 3, 28d supply, fill #0

## 2021-02-11 NOTE — Telephone Encounter (Signed)
I left patient a msg to return call. Wanted to know if it was okay to her Ozempic to [facility]. I aslo sent patient a mychart msg letting her know since [facility] has a limited supply of Ozempic I went ahead and sent her med to [facility] if she could not pick this med up to give me a call.

## 2021-02-11 NOTE — Telephone Encounter (Signed)
Betty Cain 

## 2021-02-11 NOTE — Telephone Encounter (Signed)
Patient returned call and Ozempic has been sent to [facility]

## 2021-02-11 NOTE — Telephone Encounter (Signed)
Pt called in and stated that it will be okay to send the Ozempic to the [facility]. Please advise

## 2021-02-11 NOTE — Telephone Encounter (Signed)
Ozempic has been sent to [facility]

## 2021-02-11 NOTE — Telephone Encounter (Signed)
Last OV with Tracey 

## 2021-02-12 ENCOUNTER — Other Ambulatory Visit (HOSPITAL_COMMUNITY): Payer: Self-pay

## 2021-02-23 NOTE — Progress Notes (Signed)
Cardiology Office Note:    Date:  02/25/2021   ID:  Betty Cain, DOB 06-11-1975, MRN [id number]  PCP:  Vevelyn Francois, NP  Cardiologist:  None   Referring MD: Vevelyn Francois, NP   Chief Complaint  Patient presents with   Chest Pain     History of Present Illness:    Betty Cain is a 45 y.o. female with a hx of palpitations, type 2 diabetes, esophageal reflux chest pain, with negative work-up over the past 6-7 years.  Here today for follow-up.  Still having vague complaints of chest discomfort.  Present most mornings when she awakens at 6 AM and continues until around 9 AM when it resolves.  It feels like a fullness.  There is some shortness of breath.  Activity does not aggravate.  After it resolves it does not return.  Still has occasional palpitations.  Past Medical History:  Diagnosis Date   Allergy    Anemia    Anxiety 01/2019   Asthma    B12 deficiency    Back pain    Common migraine with intractable migraine 06/28/2017   Constipation    Diabetes ([place]) 02/2019   Fatigue    GERD (gastroesophageal reflux disease)    pos H pylori   Hypertension    controlled with medication   IBS (irritable bowel syndrome) 2005   Joint pain    Neonatal death    Vaginal delivery, full term-lived x2 hours.    Palpitations    Shortness of breath    Shortness of breath on exertion    Spinal headache    Swelling of both lower extremities    Valvular heart disease    Vitamin D deficiency 02/2019    Past Surgical History:  Procedure Laterality Date   ADENOIDECTOMY     and tonsils (as a child)   boil  2003   right elbow   CESAREAN SECTION     x4   CESAREAN SECTION  06/02/2011   Procedure: CESAREAN SECTION;  Surgeon: [name], MD;  Location: [facility];  Service: Gynecology;  Laterality: N/A;  Primary Cesarean Section Delivery Baby Boy @ 0004, Apgars 9/9   CESAREAN SECTION N/A 12/10/2013   Procedure: REPEAT CESAREAN SECTION;  Surgeon: [name], MD;  Location: [facility];   Service: Obstetrics;  Laterality: N/A;   CESAREAN SECTION     colonoscopy  11/23/2018   stated had issues with sleep when in for procedure   OTHER SURGICAL HISTORY  2005   Uterine surgery    uterine cauterization      Current Medications: Current Meds  Medication Sig   acetaminophen (TYLENOL) 500 MG tablet Take 500 mg by mouth every 6 (six) hours as needed.   albuterol (VENTOLIN HFA) 108 (90 Base) MCG/ACT inhaler Inhale 2 puffs into the lungs every 6 (six) hours as needed for wheezing or shortness of breath.   Blood Pressure Monitoring DEVI 1 each by Does not apply route daily.   dexlansoprazole (DEXILANT) 60 MG capsule Take 1 capsule (60 mg total) by mouth daily.   docusate sodium (COLACE) 100 MG capsule Take 1 capsule (100 mg total) by mouth daily.   ferrous sulfate (FEROSUL) 325 (65 FE) MG tablet TAKE 1 TABLET (325 MG TOTAL) BY MOUTH DAILY.   Fremanezumab-vfrm 225 MG/1.5ML SOAJ INJECT 225 MG INTO THE SKIN EVERY 30 (THIRTY) DAYS.   gabapentin (NEURONTIN) 300 MG capsule TAKE 1 CAPSULE BY MOUTH IN THE MORNING AND 2 CAPSULES IN THE EVENING.  glucose blood (ACCU-CHEK GUIDE) test strip Use as instructed   levocetirizine (XYZAL) 5 MG tablet TAKE 1 TABLET (5 MG TOTAL) BY MOUTH EVERY EVENING.   methylcellulose (CITRUCEL) oral powder Take as directed, daily   metoCLOPramide (REGLAN) 5 MG tablet Take 1 tablet (5 mg total) by mouth 3 (three) times daily before meals.   metoprolol succinate (TOPROL-XL) 100 MG 24 hr tablet Take 1 tablet (100 mg total) by mouth daily.   metoprolol tartrate (LOPRESSOR) 50 MG tablet Take one tablet by mouth 2 hours prior to CT   Multiple Vitamin (MULTI VITAMIN DAILY PO) Take by mouth.   ondansetron (ZOFRAN-ODT) 8 MG disintegrating tablet TAKE 1 TABLET BY MOUTH EVERY 8 HOURS AS NEEDED FOR NAUSEA OR VOMITING.   OneTouch Delica Lancets 69G MISC 1 application by Does not apply route in the morning, at noon, in the evening, and at bedtime.   pantoprazole (PROTONIX) 40 MG  tablet TAKE 1 TABLET BY MOUTH 2 TIMES DAILY BEFORE A MEAL.   Plecanatide (TRULANCE) 3 MG TABS Take 3 mg by mouth daily. Exp: 08-2022   potassium chloride SA (KLOR-CON) 20 MEQ tablet Take 1 tablet (20 mEq total) by mouth daily.   Rimegepant Sulfate 75 MG TBDP Take 1 tab at onset of migraine. May repeat in 2 hrs, if needed. Max dose: 2 tabs/day or 15/month. This is a 90 day rx.   rosuvastatin (CRESTOR) 5 MG tablet TAKE 1 TABLET (5 MG TOTAL) BY MOUTH AT BEDTIME.   Semaglutide, 2 MG/DOSE, (OZEMPIC, 2 MG/DOSE,) 8 MG/3ML SOPN Inject 2 mg as directed once a week.   sucralfate (CARAFATE) 1 GM/10ML suspension TAKE 10 MLS (1 G TOTAL) BY MOUTH EVERY 6 (SIX) HOURS AS NEEDED.   tiZANidine (ZANAFLEX) 2 MG tablet Take 1 tablet (2 mg total) by mouth every 8 (eight) hours as needed for muscle spasms. Will need follow up for further refills.  12/29/2020   vitamin B-12 (CYANOCOBALAMIN) 1000 MCG tablet Take 5 tablets (5,000 mcg total) by mouth daily.   Vitamin D, Ergocalciferol, (DRISDOL) 1.25 MG (50000 UNIT) CAPS capsule TAKE 1 CAPSULE (50,000 UNITS TOTAL) BY MOUTH EVERY 7 (SEVEN) DAYS.     Allergies:   Topamax [topiramate]   Social History   Socioeconomic History   Marital status: Married    Spouse name: [name]   Number of children: 4   Years of education: Not on file   Highest education level: Not on file  Occupational History   Occupation: Unemployed  Tobacco Use   Smoking status: Never   Smokeless tobacco: Never  Vaping Use   Vaping Use: Never used  Substance and Sexual Activity   Alcohol use: No   Drug use: No   Sexual activity: Yes    Birth control/protection: None, Implant    Comment: pregnant  Other Topics Concern   Not on file  Social History Narrative   Lives with husband and child   Caffeine use: daily (tea), sometimes coffee/soda   Right handed    Social Determinants of Health   Financial Resource Strain: Not on file  Food Insecurity: No Food Insecurity   Worried About  Charity fundraiser in the Last Year: Never true   Ran Out of Food in the Last Year: Never true  Transportation Needs: Not on file  Physical Activity: Not on file  Stress: Not on file  Social Connections: Not on file     Family History: The patient's family history includes Diabetes in her father, mother, and sister.  There is no history of Anesthesia problems, Colon cancer, Esophageal cancer, Rectal cancer, Stomach cancer, or Colon polyps.  ROS:   Please see the history of present illness.    Our conversation was via a Optometrist.  All other systems reviewed and are negative.  EKGs/Labs/Other Studies Reviewed:    The following studies were reviewed today: Cardiac event monitor 2021: October and November Study Highlights  Normal sinus rhythm Rare PAC's and PVC's with < 1% burden for each. One asymptomatic 6 beat NSVT Symptoms during critical event no associated with arrhythmia.   Myocardial perfusion imaging January 2020 Study Highlights  Nuclear stress EF: 66%. There was no ST segment deviation noted during stress. The study is normal. This is a low risk study. The left ventricular ejection fraction is hyperdynamic (>65%).   EKG:  EKG normal sinus rhythm, prominent voltage, nonspecific T wave flattening, normal PR interval.  Recent Labs: 12/22/2020: ALT 11; BUN 9; Creatinine, Ser 0.60; Hemoglobin 11.5; Platelets 424; Potassium 4.2; Sodium 139; TSH 1.430  Recent Lipid Panel    Component Value Date/Time   CHOL 209 (H) 12/22/2020 1059   TRIG 135 12/22/2020 1059   HDL 32 (L) 12/22/2020 1059   CHOLHDL 6.5 (H) 12/22/2020 1059   CHOLHDL 5.5 10/08/2014 1121   VLDL 21 10/08/2014 1121   LDLCALC 152 (H) 12/22/2020 1059    Physical Exam:    VS:  BP 100/64   Pulse 85   Ht 4\' 10"  (1.473 m)   Wt 134 lb (60.8 kg)   SpO2 96%   BMI 28.01 kg/m     Wt Readings from Last 3 Encounters:  02/25/21 134 lb (60.8 kg)  02/10/21 131 lb (59.4 kg)  02/06/21 137 lb 9.6 oz (62.4 kg)      GEN: Healthy appearing, slightly overweight.. No acute distress HEENT: Normal NECK: No JVD. LYMPHATICS: No lymphadenopathy CARDIAC: No murmur. RRR no gallop, or edema. VASCULAR:  Normal Pulses. No bruits. RESPIRATORY:  Clear to auscultation without rales, wheezing or rhonchi  ABDOMEN: Soft, non-tender, non-distended, No pulsatile mass, MUSCULOSKELETAL: No deformity  SKIN: Warm and dry NEUROLOGIC:  Alert and oriented x 3 PSYCHIATRIC:  Normal affect   ASSESSMENT:    1. Chest pain at rest   2. Palpitations   3. Class 1 obesity with serious comorbidity and body mass index (BMI) of 31.0 to 31.9 in adult, unspecified obesity type   4. Paroxysmal supraventricular tachycardia ([place])   5. Type 2 diabetes mellitus with hyperglycemia, without long-term current use of insulin (HCC)   6. Dyslipidemia (high LDL; low HDL)   7. Precordial pain    PLAN:    In order of problems listed above:  Coronary CTA with FFR if indicated.  Already on moderate dose metoprolol.  Will need at least 50 mg on the morning of the procedure.  She takes Toprol-XL 100 mg at bedtime. Continue beta-blocker therapy. Overweight problem is mild.  The BMI is 28. Continue beta-blocker therapy Management per primary care Currently on rosuvastatin 5 mg/day possibly because of side effects.  Most recent LDL was 152 target is 70.  If she has evidence of atherosclerosis, we will need to be much more aggressive with lipid management.   Medication Adjustments/Labs and Tests Ordered: Current medicines are reviewed at length with the patient today.  Concerns regarding medicines are outlined above.  Orders Placed This Encounter  Procedures   CT CORONARY MORPH W/CTA COR W/SCORE W/CA W/CM &/OR WO/CM   Basic Metabolic Panel (BMET)   EKG  12-Lead    Meds ordered this encounter  Medications   metoprolol tartrate (LOPRESSOR) 50 MG tablet    Sig: Take one tablet by mouth 2 hours prior to CT    Dispense:  1 tablet    Refill:  0     This is a one time dose. We are aware she is taking Toprol XL     Patient Instructions  Medication Instructions:  Your physician recommends that you continue on your current medications as directed. Please refer to the Current Medication list given to you today.   Lab Work: TODAY: BMET  If you have labs (blood work) drawn today and your tests are completely normal, you will receive your results only by: [employer] (if you have MyChart) OR A paper copy in the mail If you have any lab test that is abnormal or we need to change your treatment, we will call you to review the results.   Testing/Procedures: Your physician recommends you have a Coronary CT done.   Follow-Up: At [facility], you and your health needs are our priority.  As part of our continuing mission to provide you with exceptional heart care, we have created designated Provider Care Teams.  These Care Teams include your primary Cardiologist (physician) and Advanced Practice Providers (APPs -  Physician Assistants and Nurse Practitioners) who all work together to provide you with the care you need, when you need it.  Your next appointment:   As needed    Other Instructions    Your cardiac CT will be scheduled at one of the below locations:   [facility] [address] [place], [place] [address] [phone]  [facility] [address] [place], [place] [address] [phone]  If scheduled at [facility], please arrive at the [facility] main entrance (entrance A) of [facility] 30 minutes prior to test start time. You can use the FREE valet parking offered at the main entrance (encouraged to control the heart rate for the test) Proceed to the [facility] Radiology Department (first floor) to check-in and test prep.  If scheduled at [facility], please arrive 15 mins early for check-in and test  prep.  Please follow these instructions carefully (unless otherwise directed):  Hold all erectile dysfunction medications at least 3 days (72 hrs) prior to test.  On the Night Before the Test: Be sure to Drink plenty of water. Do not consume any caffeinated/decaffeinated beverages or chocolate 12 hours prior to your test. Do not take any antihistamines 12 hours prior to your test.  On the Day of the Test: Drink plenty of water until 1 hour prior to the test. Do not eat any food 4 hours prior to the test. You may take your regular medications prior to the test.  Take metoprolol (Lopressor) two hours prior to test. HOLD Furosemide/Hydrochlorothiazide morning of the test. FEMALES- please wear underwire-free bra if available, avoid dresses & tight clothing       After the Test: Drink plenty of water. After receiving IV contrast, you may experience a mild flushed feeling. This is normal. On occasion, you may experience a mild rash up to 24 hours after the test. This is not dangerous. If this occurs, you can take Benadryl 25 mg and increase your fluid intake. If you experience trouble breathing, this can be serious. If it is severe call 911 IMMEDIATELY. If it is mild, please call our office.  If you take any of these medications: Glipizide/Metformin, Avandament, Glucavance, please do not take 48 hours after completing test unless otherwise instructed.  Please allow 2-4 weeks for scheduling of routine cardiac CTs. Some insurance companies require a pre-authorization which may delay scheduling of this test.   For non-scheduling related questions, please contact the cardiac imaging nurse navigator should you have any questions/concerns: Marchia Bond, Cardiac Imaging Nurse Navigator Gordy Clement, Cardiac Imaging Nurse Navigator [facility] Direct Office Dial: [phone]   For scheduling needs, including cancellations and rescheduling, please call [place],  [phone].    Signed, Sinclair Grooms, MD  02/25/2021 1:11 PM    [place]

## 2021-02-25 ENCOUNTER — Other Ambulatory Visit: Payer: Self-pay

## 2021-02-25 ENCOUNTER — Ambulatory Visit (INDEPENDENT_AMBULATORY_CARE_PROVIDER_SITE_OTHER): Payer: 59 | Admitting: Interventional Cardiology

## 2021-02-25 ENCOUNTER — Encounter: Payer: Self-pay | Admitting: Interventional Cardiology

## 2021-02-25 VITALS — BP 100/64 | HR 85 | Ht <= 58 in | Wt 134.0 lb

## 2021-02-25 DIAGNOSIS — R072 Precordial pain: Secondary | ICD-10-CM

## 2021-02-25 DIAGNOSIS — E785 Hyperlipidemia, unspecified: Secondary | ICD-10-CM

## 2021-02-25 DIAGNOSIS — I471 Supraventricular tachycardia: Secondary | ICD-10-CM

## 2021-02-25 DIAGNOSIS — R002 Palpitations: Secondary | ICD-10-CM

## 2021-02-25 DIAGNOSIS — R079 Chest pain, unspecified: Secondary | ICD-10-CM | POA: Diagnosis not present

## 2021-02-25 DIAGNOSIS — E1165 Type 2 diabetes mellitus with hyperglycemia: Secondary | ICD-10-CM

## 2021-02-25 DIAGNOSIS — E669 Obesity, unspecified: Secondary | ICD-10-CM | POA: Diagnosis not present

## 2021-02-25 DIAGNOSIS — Z6831 Body mass index (BMI) 31.0-31.9, adult: Secondary | ICD-10-CM

## 2021-02-25 LAB — BASIC METABOLIC PANEL
BUN/Creatinine Ratio: 17 (ref 9–23)
BUN: 10 mg/dL (ref 6–24)
CO2: 26 mmol/L (ref 20–29)
Calcium: 9.8 mg/dL (ref 8.7–10.2)
Chloride: 104 mmol/L (ref 96–106)
Creatinine, Ser: 0.6 mg/dL (ref 0.57–1.00)
Glucose: 89 mg/dL (ref 70–99)
Potassium: 4.2 mmol/L (ref 3.5–5.2)
Sodium: 140 mmol/L (ref 134–144)
eGFR: 114 mL/min/{1.73_m2} (ref >59)

## 2021-02-25 MED ORDER — METOPROLOL TARTRATE 50 MG PO TABS
ORAL_TABLET | ORAL | 0 refills | Status: DC
  Filled 2021-02-25 – 2021-03-09 (×2): qty 1, 1d supply, fill #0

## 2021-02-25 NOTE — Patient Instructions (Addendum)
Medication Instructions:  Your physician recommends that you continue on your current medications as directed. Please refer to the Current Medication list given to you today.   Lab Work: TODAY: BMET  If you have labs (blood work) drawn today and your tests are completely normal, you will receive your results only by: [employer] (if you have MyChart) OR A paper copy in the mail If you have any lab test that is abnormal or we need to change your treatment, we will call you to review the results.   Testing/Procedures: Your physician recommends you have a Coronary CT done.   Follow-Up: At [facility], you and your health needs are our priority.  As part of our continuing mission to provide you with exceptional heart care, we have created designated Provider Care Teams.  These Care Teams include your primary Cardiologist (physician) and Advanced Practice Providers (APPs -  Physician Assistants and Nurse Practitioners) who all work together to provide you with the care you need, when you need it.  Your next appointment:   As needed    Other Instructions    Your cardiac CT will be scheduled at one of the below locations:   [facility] [address] [place], [place] [address] [phone]  [facility] [address] [place], [place] [address] [phone]  If scheduled at [facility], please arrive at the [facility] main entrance (entrance A) of [facility] 30 minutes prior to test start time. You can use the FREE valet parking offered at the main entrance (encouraged to control the heart rate for the test) Proceed to the [facility] Radiology Department (first floor) to check-in and test prep.  If scheduled at [facility], please arrive 15 mins early for check-in and test prep.  Please follow these instructions carefully (unless otherwise directed):  Hold all  erectile dysfunction medications at least 3 days (72 hrs) prior to test.  On the Night Before the Test: Be sure to Drink plenty of water. Do not consume any caffeinated/decaffeinated beverages or chocolate 12 hours prior to your test. Do not take any antihistamines 12 hours prior to your test.  On the Day of the Test: Drink plenty of water until 1 hour prior to the test. Do not eat any food 4 hours prior to the test. You may take your regular medications prior to the test.  Take metoprolol (Lopressor) two hours prior to test. HOLD Furosemide/Hydrochlorothiazide morning of the test. FEMALES- please wear underwire-free bra if available, avoid dresses & tight clothing       After the Test: Drink plenty of water. After receiving IV contrast, you may experience a mild flushed feeling. This is normal. On occasion, you may experience a mild rash up to 24 hours after the test. This is not dangerous. If this occurs, you can take Benadryl 25 mg and increase your fluid intake. If you experience trouble breathing, this can be serious. If it is severe call 911 IMMEDIATELY. If it is mild, please call our office. If you take any of these medications: Glipizide/Metformin, Avandament, Glucavance, please do not take 48 hours after completing test unless otherwise instructed.  Please allow 2-4 weeks for scheduling of routine cardiac CTs. Some insurance companies require a pre-authorization which may delay scheduling of this test.   For non-scheduling related questions, please contact the cardiac imaging nurse navigator should you have any questions/concerns: Marchia Bond, Cardiac Imaging Nurse Navigator  Gordy Clement, Cardiac Imaging Nurse Navigator [facility] Direct Office Dial: [phone]   For scheduling needs, including cancellations and rescheduling, please call [place], [phone].

## 2021-03-09 ENCOUNTER — Other Ambulatory Visit (INDEPENDENT_AMBULATORY_CARE_PROVIDER_SITE_OTHER): Payer: Self-pay | Admitting: Family Medicine

## 2021-03-09 ENCOUNTER — Other Ambulatory Visit: Payer: Self-pay | Admitting: Nurse Practitioner

## 2021-03-09 ENCOUNTER — Other Ambulatory Visit: Payer: Self-pay | Admitting: Gastroenterology

## 2021-03-09 ENCOUNTER — Telehealth (HOSPITAL_COMMUNITY): Payer: Self-pay | Admitting: *Deleted

## 2021-03-09 ENCOUNTER — Other Ambulatory Visit: Payer: Self-pay

## 2021-03-09 ENCOUNTER — Encounter (INDEPENDENT_AMBULATORY_CARE_PROVIDER_SITE_OTHER): Payer: Self-pay | Admitting: Physician Assistant

## 2021-03-09 ENCOUNTER — Ambulatory Visit (INDEPENDENT_AMBULATORY_CARE_PROVIDER_SITE_OTHER): Payer: 59 | Admitting: Physician Assistant

## 2021-03-09 VITALS — BP 102/70 | HR 86 | Temp 98.1°F | Ht <= 58 in | Wt 129.0 lb

## 2021-03-09 DIAGNOSIS — Z9189 Other specified personal risk factors, not elsewhere classified: Secondary | ICD-10-CM | POA: Diagnosis not present

## 2021-03-09 DIAGNOSIS — Z6831 Body mass index (BMI) 31.0-31.9, adult: Secondary | ICD-10-CM

## 2021-03-09 DIAGNOSIS — E1140 Type 2 diabetes mellitus with diabetic neuropathy, unspecified: Secondary | ICD-10-CM | POA: Diagnosis not present

## 2021-03-09 DIAGNOSIS — E669 Obesity, unspecified: Secondary | ICD-10-CM | POA: Diagnosis not present

## 2021-03-09 DIAGNOSIS — J3089 Other allergic rhinitis: Secondary | ICD-10-CM

## 2021-03-09 DIAGNOSIS — R000 Tachycardia, unspecified: Secondary | ICD-10-CM

## 2021-03-09 DIAGNOSIS — E559 Vitamin D deficiency, unspecified: Secondary | ICD-10-CM

## 2021-03-09 DIAGNOSIS — E66811 Obesity, class 1: Secondary | ICD-10-CM

## 2021-03-09 MED ORDER — VITAMIN D (ERGOCALCIFEROL) 1.25 MG (50000 UNIT) PO CAPS
ORAL_CAPSULE | ORAL | 0 refills | Status: DC
Start: 2021-03-09 — End: 2021-03-30
  Filled 2021-03-09: qty 4, 28d supply, fill #0
  Filled 2021-03-09: qty 4, 7d supply, fill #0

## 2021-03-09 MED ORDER — OZEMPIC (2 MG/DOSE) 8 MG/3ML ~~LOC~~ SOPN
2.0000 mg | PEN_INJECTOR | SUBCUTANEOUS | 0 refills | Status: DC
  Filled 2021-03-09 (×2): qty 3, 28d supply, fill #0

## 2021-03-09 MED ORDER — TRULANCE 3 MG PO TABS
3.0000 mg | ORAL_TABLET | Freq: Every day | ORAL | 3 refills | Status: DC
  Filled 2021-03-09: qty 30, 30d supply, fill #0

## 2021-03-09 MED ORDER — METOCLOPRAMIDE HCL 5 MG PO TABS
5.0000 mg | ORAL_TABLET | Freq: Three times a day (TID) | ORAL | 0 refills | Status: DC
  Filled 2021-03-09: qty 90, 30d supply, fill #0

## 2021-03-09 MED ORDER — LEVOCETIRIZINE DIHYDROCHLORIDE 5 MG PO TABS
ORAL_TABLET | Freq: Every evening | ORAL | 3 refills | Status: DC
  Filled 2021-03-09: qty 90, 90d supply, fill #0
  Filled 2021-06-12: qty 90, 90d supply, fill #1
  Filled 2021-06-12: qty 90, 90d supply, fill #0

## 2021-03-09 MED ORDER — FLUTICASONE PROPIONATE 50 MCG/ACT NA SUSP
2.0000 | Freq: Every day | NASAL | 11 refills | Status: DC
  Filled 2021-03-09 – 2021-06-08 (×2): qty 16, 30d supply, fill #0

## 2021-03-09 MED FILL — Rosuvastatin Calcium Tab 5 MG: ORAL | 60 days supply | Qty: 60 | Fill #2 | Status: AC

## 2021-03-09 NOTE — Telephone Encounter (Signed)
Pt last seen by Tracey Aguilar, PA-C.  

## 2021-03-09 NOTE — Telephone Encounter (Signed)
Reaching out to patient (through an [place] interpreter, [place] [phone]) to offer assistance regarding upcoming cardiac imaging study; pt verbalizes understanding of appt date/time, parking situation and where to check in, pre-test NPO status and medications ordered, and verified current allergies; name and call back number provided for further questions should they arise  Gordy Clement RN Navigator Cardiac Imaging [facility] [phone] office [phone] cell  Patient to take 50mg  metoprolol tartrate two hours prior to cardiac CT scan.  She is aware to arrive at 10:30am for her 11am.

## 2021-03-09 NOTE — Telephone Encounter (Signed)
LAST APPOINTMENT DATE: 03/09/21 NEXT APPOINTMENT DATE: 03/30/21   [facility] [place] [address] Phone: [phone] Fax: [phone]  [place] [place] - [address] [place] [address] Phone: [phone] Fax: [place] [address] Phone: [phone] Fax: [phone]  Patient is requesting a refill of the following medications: Requested Prescriptions   Pending Prescriptions Disp Refills   metoprolol succinate (TOPROL-XL) 100 MG 24 hr tablet 90 tablet 0    Sig: Take 1 tablet (100 mg total) by mouth daily.    Date last filled: 10/20/20 Previously prescribed by tracey  Lab Results  Component Value Date   HGBA1C 5.7 (A) 01/01/2021   HGBA1C 6.7 (H) 09/29/2020   HGBA1C 5.7 (A) 06/13/2020   Lab Results  Component Value Date   LDLCALC 152 (H) 12/22/2020   CREATININE 0.60 02/25/2021   Lab Results  Component Value Date   VD25OH 30.0 01/01/2021   VD25OH 42.6 09/29/2020   VD25OH 22.9 (L) 01/10/2020    BP Readings from Last 3 Encounters:  03/09/21 102/70  02/25/21 100/64  02/10/21 111/77

## 2021-03-09 NOTE — Progress Notes (Signed)
Patient requested refill for Trulance. Was given samples at last OV

## 2021-03-09 NOTE — Progress Notes (Signed)
Chief Complaint:   OBESITY Betty Cain is here to discuss her progress with her obesity treatment plan along with follow-up of her obesity related diagnoses. Betty Cain is on the Category 2 Plan and states she is following her eating plan approximately 95% of the time. Betty Cain states she is walking for 5 minutes 2-3 times per week.  Today's visit was #: 72 Starting weight: 152 lbs Starting date: 01/10/2020 Today's weight: 129 lbs Today's date: 03/09/2021 Total lbs lost to date: 23 Total lbs lost since last in-office visit: 2  Interim History: Betty Cain has been very hungry and therefore adding rice or pasta to her meals, and then skipping the next meal.  Subjective:   1. Type 2 diabetes mellitus with diabetic neuropathy, without long-term current use of insulin (HCC) Betty Cain's last A1c was 5.7. she is on Ozempic 2 mg but she notes hunger.  2. Vitamin D deficiency Betty Cain is on Vit D weekly.  3. At risk for impaired metabolic function Betty Cain is at increased risk for impaired metabolic function due to current nutrition and muscle mass.  Assessment/Plan:   1. Type 2 diabetes mellitus with diabetic neuropathy, without long-term current use of insulin (HCC) We will refill Ozempic 2 mg for 1 month. Good blood sugar control is important to decrease the likelihood of diabetic complications such as nephropathy, neuropathy, limb loss, blindness, coronary artery disease, and death. Intensive lifestyle modification including diet, exercise and weight loss are the first line of treatment for diabetes.   - Semaglutide, 2 MG/DOSE, (OZEMPIC, 2 MG/DOSE,) 8 MG/3ML SOPN; Inject 2 mg as directed once a week.  Dispense: 3 mL; Refill: 0  2. Vitamin D deficiency Low Vitamin D level contributes to fatigue and are associated with obesity, breast, and colon cancer. We will refill prescription Vitamin D 50,000 IU every week for 1 month. Betty Cain will follow-up for routine testing of Vitamin D, at least 2-3 times per year to avoid  over-replacement.  - Vitamin D, Ergocalciferol, (DRISDOL) 1.25 MG (50000 UNIT) CAPS capsule; TAKE 1 CAPSULE (50,000 UNITS TOTAL) BY MOUTH EVERY 7 (SEVEN) DAYS.  Dispense: 4 capsule; Refill: 0  3. At risk for impaired metabolic function Betty Cain was given approximately 15 minutes of impaired  metabolic function prevention counseling today. We discussed intensive lifestyle modifications today with an emphasis on specific nutrition and exercise instructions and strategies.   Repetitive spaced learning was employed today to elicit superior memory formation and behavioral change.  4. Obesity: Current BMI 26.97 Betty Cain is currently in the action stage of change. As such, her goal is to continue with weight loss efforts. She has agreed to the Category 2 Plan.   Exercise goals: As is.  Behavioral modification strategies: no skipping meals and meal planning and cooking strategies.  Betty Cain has agreed to follow-up with our clinic in 4 weeks. She was informed of the importance of frequent follow-up visits to maximize her success with intensive lifestyle modifications for her multiple health conditions.   Objective:   Blood pressure 102/70, pulse 86, temperature 98.1 F (36.7 C), height 4\' 10"  (1.473 m), weight 129 lb (58.5 kg), SpO2 100 %. Body mass index is 26.96 kg/m.  General: Cooperative, alert, well developed, in no acute distress. HEENT: Conjunctivae and lids unremarkable. Cardiovascular: Regular rhythm.  Lungs: Normal work of breathing. Neurologic: No focal deficits.   Lab Results  Component Value Date   CREATININE 0.60 02/25/2021   BUN 10 02/25/2021   NA 140 02/25/2021   K 4.2 02/25/2021  CL 104 02/25/2021   CO2 26 02/25/2021   Lab Results  Component Value Date   ALT 11 12/22/2020   AST 14 12/22/2020   ALKPHOS 89 12/22/2020   BILITOT 0.3 12/22/2020   Lab Results  Component Value Date   HGBA1C 5.7 (A) 01/01/2021   HGBA1C 6.7 (H) 09/29/2020   HGBA1C 5.7 (A) 06/13/2020   HGBA1C  7.5 (A) 01/02/2020   HGBA1C 7.5 01/02/2020   Lab Results  Component Value Date   INSULIN 8.7 01/10/2020   Lab Results  Component Value Date   TSH 1.430 12/22/2020   Lab Results  Component Value Date   CHOL 209 (H) 12/22/2020   HDL 32 (L) 12/22/2020   LDLCALC 152 (H) 12/22/2020   TRIG 135 12/22/2020   CHOLHDL 6.5 (H) 12/22/2020   Lab Results  Component Value Date   VD25OH 30.0 01/01/2021   VD25OH 42.6 09/29/2020   VD25OH 22.9 (L) 01/10/2020   Lab Results  Component Value Date   WBC 7.0 12/22/2020   HGB 11.5 12/22/2020   HCT 35.5 12/22/2020   MCV 82 12/22/2020   PLT 424 12/22/2020   Lab Results  Component Value Date   IRON 25 (L) 01/01/2021   TIBC 344 01/01/2021   FERRITIN 20 01/01/2021   Attestation Statements:   Reviewed by clinician on day of visit: allergies, medications, problem list, medical history, surgical history, family history, social history, and previous encounter notes.   Wilhemena Durie, am acting as transcriptionist for [employer], PA-C.  I have reviewed the above documentation for accuracy and completeness, and I agree with the above. Abby Potash, PA-C

## 2021-03-10 ENCOUNTER — Ambulatory Visit (HOSPITAL_COMMUNITY)
Admission: RE | Admit: 2021-03-10 | Discharge: 2021-03-10 | Disposition: A | Discharge status: Home / Self Care | Payer: 59 | Source: Ambulatory Visit | Attending: Interventional Cardiology | Admitting: Interventional Cardiology

## 2021-03-10 ENCOUNTER — Other Ambulatory Visit: Payer: Self-pay

## 2021-03-10 ENCOUNTER — Encounter (HOSPITAL_COMMUNITY): Payer: Self-pay

## 2021-03-10 DIAGNOSIS — R072 Precordial pain: Secondary | ICD-10-CM | POA: Diagnosis not present

## 2021-03-10 MED ORDER — METOPROLOL TARTRATE 5 MG/5ML IV SOLN
5.0000 mg | INTRAVENOUS | Status: DC | PRN
  Administered 2021-03-10 (×2): 5 mg via INTRAVENOUS

## 2021-03-10 MED ORDER — DILTIAZEM HCL 25 MG/5ML IV SOLN
10.0000 mg | Freq: Once | INTRAVENOUS | Status: AC
  Administered 2021-03-10: 10 mg via INTRAVENOUS

## 2021-03-10 NOTE — Progress Notes (Signed)
CT Heart Aborted. PT HR too high. CT Tech, CTHeart navigator aware.

## 2021-03-11 ENCOUNTER — Other Ambulatory Visit (INDEPENDENT_AMBULATORY_CARE_PROVIDER_SITE_OTHER): Payer: Self-pay | Admitting: Emergency Medicine

## 2021-03-11 ENCOUNTER — Other Ambulatory Visit: Payer: Self-pay

## 2021-03-11 ENCOUNTER — Other Ambulatory Visit (HOSPITAL_COMMUNITY): Payer: Self-pay

## 2021-03-11 ENCOUNTER — Encounter (INDEPENDENT_AMBULATORY_CARE_PROVIDER_SITE_OTHER): Payer: Self-pay | Admitting: Physician Assistant

## 2021-03-11 ENCOUNTER — Encounter (INDEPENDENT_AMBULATORY_CARE_PROVIDER_SITE_OTHER): Payer: Self-pay | Admitting: Emergency Medicine

## 2021-03-11 DIAGNOSIS — E1140 Type 2 diabetes mellitus with diabetic neuropathy, unspecified: Secondary | ICD-10-CM

## 2021-03-11 MED ORDER — OZEMPIC (2 MG/DOSE) 8 MG/3ML ~~LOC~~ SOPN
2.0000 mg | PEN_INJECTOR | SUBCUTANEOUS | 0 refills | Status: DC
  Filled 2021-03-11 – 2021-03-23 (×3): qty 3, 28d supply, fill #0

## 2021-03-11 NOTE — Telephone Encounter (Signed)
Let her know to have her PCP refill this please.

## 2021-03-11 NOTE — Telephone Encounter (Signed)
Send to [facility] and let the patient know please.

## 2021-03-11 NOTE — Telephone Encounter (Signed)
Ozempic has been sent to [facility] has been sent to patient.

## 2021-03-12 ENCOUNTER — Other Ambulatory Visit (INDEPENDENT_AMBULATORY_CARE_PROVIDER_SITE_OTHER): Payer: Self-pay | Admitting: Family Medicine

## 2021-03-12 ENCOUNTER — Other Ambulatory Visit: Payer: Self-pay | Admitting: Interventional Cardiology

## 2021-03-12 DIAGNOSIS — R000 Tachycardia, unspecified: Secondary | ICD-10-CM

## 2021-03-12 NOTE — Telephone Encounter (Signed)
LAST APPOINTMENT DATE: 03/09/21 NEXT APPOINTMENT DATE: 03/30/21   [facility] [place] [address] Phone: [phone] Fax: [phone]  [place] [place] - [address] [employer] [place]  [address] Phone: [phone] Fax: [place] [address] Phone: [phone] Fax: [phone]  Patient is requesting a refill of the following medications: Requested Prescriptions   Pending Prescriptions Disp Refills   metoprolol succinate (TOPROL-XL) 100 MG 24 hr tablet 90 tablet 0    Sig: Take 1 tablet (100 mg total) by mouth daily.    Date last filled: 10/20/20 Previously [facility]  Lab Results  Component Value Date   HGBA1C 5.7 (A) 01/01/2021   HGBA1C 6.7 (H) 09/29/2020   HGBA1C 5.7 (A) 06/13/2020   Lab Results  Component Value Date   LDLCALC 152 (H) 12/22/2020   CREATININE 0.60 02/25/2021   Lab Results  Component Value Date   VD25OH 30.0 01/01/2021   VD25OH 42.6 09/29/2020   VD25OH 22.9 (L) 01/10/2020    BP Readings from Last 3 Encounters:  03/10/21 (!) 105/54  03/09/21 102/70  02/25/21 100/64

## 2021-03-13 ENCOUNTER — Other Ambulatory Visit: Payer: Self-pay

## 2021-03-16 ENCOUNTER — Other Ambulatory Visit: Payer: Self-pay

## 2021-03-19 ENCOUNTER — Other Ambulatory Visit: Payer: Self-pay | Admitting: Interventional Cardiology

## 2021-03-19 ENCOUNTER — Other Ambulatory Visit: Payer: Self-pay

## 2021-03-20 ENCOUNTER — Other Ambulatory Visit: Payer: Self-pay

## 2021-03-20 DIAGNOSIS — R000 Tachycardia, unspecified: Secondary | ICD-10-CM

## 2021-03-20 MED ORDER — METOPROLOL SUCCINATE ER 100 MG PO TB24
100.0000 mg | ORAL_TABLET | Freq: Every day | ORAL | 3 refills | Status: DC
  Filled 2021-03-20: qty 90, 90d supply, fill #0
  Filled 2021-06-12: qty 90, 90d supply, fill #1
  Filled 2021-06-12: qty 90, 90d supply, fill #0

## 2021-03-20 NOTE — Telephone Encounter (Signed)
Pt's medication was sent to pt's pharmacy as requested. Confirmation received.  °

## 2021-03-23 ENCOUNTER — Other Ambulatory Visit (HOSPITAL_COMMUNITY): Payer: Self-pay

## 2021-03-24 ENCOUNTER — Other Ambulatory Visit: Payer: Self-pay | Admitting: Nurse Practitioner

## 2021-03-24 ENCOUNTER — Other Ambulatory Visit: Payer: Self-pay

## 2021-03-24 ENCOUNTER — Telehealth: Payer: Self-pay

## 2021-03-24 MED FILL — Sucralfate Susp 1 GM/10ML: ORAL | 10 days supply | Qty: 420 | Fill #1 | Status: CN

## 2021-03-24 NOTE — Telephone Encounter (Signed)
Called patient via language line [phone]).  She reports that she is using the Reglan TID ac, Protonix BID and Carafate suspension TID but she continues to have a lot of burning in her chest with some burping and regurgitation for the last 3 days.  She has also developed some nausea and is having a hard time eating. She indicates that she has not picked up the [place]. I encouraged her to pick up the [place] and take in place of the pantoprazole.  She said that Carafate used to help but it isn't anymore. She was directed to use zofran (script from [employer]) for nausea. She would like to know what else she can try. Note: She said the Trulance is helping and she is having a BM daily.  Please advise

## 2021-03-24 NOTE — Telephone Encounter (Signed)
Thanks Jan. Agree with the plan about the Dexilant and the Zofran. I do think she needs a repeat EGD, reached out to anesthesia previously but don't think I heard back. I will speak with Osvaldo Angst about her case to see if she can do an EGD In the [facility] as the next step. If she has not tried FD gard yet, she can try that OTC.Thanks

## 2021-03-24 NOTE — Telephone Encounter (Signed)
Sent patient a MyChart message with recommendations.  

## 2021-03-25 ENCOUNTER — Other Ambulatory Visit: Payer: Self-pay

## 2021-03-25 MED ORDER — DICYCLOMINE HCL 10 MG/5ML PO SOLN
10.0000 mg | Freq: Three times a day (TID) | ORAL | 0 refills | Status: DC
  Filled 2021-03-25: qty 600, 30d supply, fill #0

## 2021-03-26 ENCOUNTER — Other Ambulatory Visit: Payer: Self-pay

## 2021-03-26 DIAGNOSIS — R110 Nausea: Secondary | ICD-10-CM

## 2021-03-26 DIAGNOSIS — R109 Unspecified abdominal pain: Secondary | ICD-10-CM

## 2021-03-26 DIAGNOSIS — R1013 Epigastric pain: Secondary | ICD-10-CM

## 2021-03-26 DIAGNOSIS — D509 Iron deficiency anemia, unspecified: Secondary | ICD-10-CM

## 2021-03-26 DIAGNOSIS — R6881 Early satiety: Secondary | ICD-10-CM

## 2021-03-26 DIAGNOSIS — K219 Gastro-esophageal reflux disease without esophagitis: Secondary | ICD-10-CM

## 2021-03-26 NOTE — Progress Notes (Signed)
EGD at [facility] with Dr. Ardis Hughs (Dr. Havery Moros patient) on Tuesday, 1-24.

## 2021-03-27 ENCOUNTER — Other Ambulatory Visit: Payer: Self-pay

## 2021-03-30 ENCOUNTER — Ambulatory Visit (INDEPENDENT_AMBULATORY_CARE_PROVIDER_SITE_OTHER): Payer: Medicaid Other | Admitting: Physician Assistant

## 2021-03-30 ENCOUNTER — Other Ambulatory Visit: Payer: Self-pay

## 2021-03-30 ENCOUNTER — Other Ambulatory Visit (HOSPITAL_COMMUNITY): Payer: Self-pay

## 2021-03-30 ENCOUNTER — Encounter (INDEPENDENT_AMBULATORY_CARE_PROVIDER_SITE_OTHER): Payer: Self-pay | Admitting: Physician Assistant

## 2021-03-30 VITALS — BP 103/65 | HR 89 | Temp 98.4°F | Ht <= 58 in | Wt 131.0 lb

## 2021-03-30 DIAGNOSIS — Z6831 Body mass index (BMI) 31.0-31.9, adult: Secondary | ICD-10-CM | POA: Diagnosis not present

## 2021-03-30 DIAGNOSIS — E669 Obesity, unspecified: Secondary | ICD-10-CM | POA: Diagnosis not present

## 2021-03-30 DIAGNOSIS — E1140 Type 2 diabetes mellitus with diabetic neuropathy, unspecified: Secondary | ICD-10-CM

## 2021-03-30 DIAGNOSIS — E559 Vitamin D deficiency, unspecified: Secondary | ICD-10-CM | POA: Diagnosis not present

## 2021-03-30 MED ORDER — OZEMPIC (2 MG/DOSE) 8 MG/3ML ~~LOC~~ SOPN
2.0000 mg | PEN_INJECTOR | SUBCUTANEOUS | 0 refills | Status: DC
  Filled 2021-03-30: qty 3, 28d supply, fill #0

## 2021-03-30 MED ORDER — VITAMIN D (ERGOCALCIFEROL) 1.25 MG (50000 UNIT) PO CAPS
ORAL_CAPSULE | ORAL | 0 refills | Status: DC
  Filled 2021-03-30: qty 4, 28d supply, fill #0

## 2021-03-30 NOTE — Telephone Encounter (Signed)
Called patient via the language line [phone]) to see if she would like to proceed with EGD on  Tuesday,1-24 at [facility].  She was at an appointment and asked that we call her later this afternoon.

## 2021-03-30 NOTE — Progress Notes (Signed)
Chief Complaint:   OBESITY Betty Cain is here to discuss her progress with her obesity treatment plan along with follow-up of her obesity related diagnoses. Betty Cain is on the Category 2 Plan and states she is following her eating plan approximately 95% of the time. Betty Cain states she is walking and using the treadmill for 15 minutes 3 times per week.  Today's visit was #: 18 Starting weight: 152 lbs Starting date: 01/10/2020 Today's weight: 131 lbs Today's date: 03/30/2021 Total lbs lost to date: 21 lbs Total lbs lost since last in-office visit: 0  Interim History: Betty Cain has not taken Ozempic for 3 weeks due to her pharmacy not having it. Her appetite has been increased since being off of it. She is eating more carbohydrates than normal.  Subjective:   1. Vitamin D deficiency Betty Cain is currently taking prescription vitamin D 50,000 IU each week. She denies nausea, vomiting or muscle weakness.  2. Type 2 diabetes mellitus with diabetic neuropathy, without long-term current use of insulin ([place]) Betty Cain is not feeling fuller when she is eating, so she is overeating. Her last A1C was 5.7.  Assessment/Plan:   1. Vitamin D deficiency Low Vitamin D level contributes to fatigue and are associated with obesity, breast, and colon cancer. We will refill prescription Vitamin D 50,000 IU every week for 1 month with no refills and [place] will follow-up for routine testing of Vitamin D, at least 2-3 times per year to avoid over-replacement. - Vitamin D, Ergocalciferol, (DRISDOL) 1.25 MG (50000 UNIT) CAPS capsule; TAKE 1 CAPSULE  BY MOUTH EVERY 7 DAYS.  Dispense: 4 capsule; Refill: 0  2. Type 2 diabetes mellitus with diabetic neuropathy, without long-term current use of insulin (HCC) We will refill Ozempic for 1 month with no refills. Good blood sugar control is important to decrease the likelihood of diabetic complications such as nephropathy, neuropathy, limb loss, blindness, coronary artery disease, and death.  Intensive lifestyle modification including diet, exercise and weight loss are the first line of treatment for diabetes.   - Semaglutide, 2 MG/DOSE, (OZEMPIC, 2 MG/DOSE,) 8 MG/3ML SOPN; Inject 2 mg under the skin once a week.  Dispense: 3 mL; Refill: 0  3. Obesity: Current BMI 27.39 Betty Cain is currently in the action stage of change. As such, her goal is to continue with weight loss efforts. She has agreed to the Category 2 Plan.   Exercise goals:  As is.  Behavioral modification strategies: increasing lean protein intake and decreasing simple carbohydrates.  Betty Cain has agreed to follow-up with our clinic in 3 weeks. She was informed of the importance of frequent follow-up visits to maximize her success with intensive lifestyle modifications for her multiple health conditions.   Objective:   Blood pressure 103/65, pulse 89, temperature 98.4 F (36.9 C), height 4\' 10"  (1.473 m), weight 131 lb (59.4 kg), SpO2 95 %. Body mass index is 27.38 kg/m.  General: Cooperative, alert, well developed, in no acute distress. HEENT: Conjunctivae and lids unremarkable. Cardiovascular: Regular rhythm.  Lungs: Normal work of breathing. Neurologic: No focal deficits.   Lab Results  Component Value Date   CREATININE 0.60 02/25/2021   BUN 10 02/25/2021   NA 140 02/25/2021   K 4.2 02/25/2021   CL 104 02/25/2021   CO2 26 02/25/2021   Lab Results  Component Value Date   ALT 11 12/22/2020   AST 14 12/22/2020   ALKPHOS 89 12/22/2020   BILITOT 0.3 12/22/2020   Lab Results  Component Value Date  HGBA1C 5.7 (A) 01/01/2021   HGBA1C 6.7 (H) 09/29/2020   HGBA1C 5.7 (A) 06/13/2020   HGBA1C 7.5 (A) 01/02/2020   HGBA1C 7.5 01/02/2020   Lab Results  Component Value Date   INSULIN 8.7 01/10/2020   Lab Results  Component Value Date   TSH 1.430 12/22/2020   Lab Results  Component Value Date   CHOL 209 (H) 12/22/2020   HDL 32 (L) 12/22/2020   LDLCALC 152 (H) 12/22/2020   TRIG 135 12/22/2020    CHOLHDL 6.5 (H) 12/22/2020   Lab Results  Component Value Date   VD25OH 30.0 01/01/2021   VD25OH 42.6 09/29/2020   VD25OH 22.9 (L) 01/10/2020   Lab Results  Component Value Date   WBC 7.0 12/22/2020   HGB 11.5 12/22/2020   HCT 35.5 12/22/2020   MCV 82 12/22/2020   PLT 424 12/22/2020   Lab Results  Component Value Date   IRON 25 (L) 01/01/2021   TIBC 344 01/01/2021   FERRITIN 20 01/01/2021   Attestation Statements:   Reviewed by clinician on day of visit: allergies, medications, problem list, medical history, surgical history, family history, social history, and previous encounter notes.  I, Tonye Pearson, am acting as Location manager for [employer], PA-C.  I have reviewed the above documentation for accuracy and completeness, and I agree with the above. Abby Potash, PA-C

## 2021-04-01 ENCOUNTER — Other Ambulatory Visit (HOSPITAL_COMMUNITY): Payer: Self-pay

## 2021-04-02 ENCOUNTER — Other Ambulatory Visit (HOSPITAL_COMMUNITY): Payer: Self-pay

## 2021-04-08 ENCOUNTER — Other Ambulatory Visit (HOSPITAL_COMMUNITY): Payer: Self-pay

## 2021-04-13 ENCOUNTER — Other Ambulatory Visit (HOSPITAL_COMMUNITY): Payer: Self-pay

## 2021-04-14 ENCOUNTER — Other Ambulatory Visit (HOSPITAL_COMMUNITY): Payer: Self-pay

## 2021-04-22 ENCOUNTER — Other Ambulatory Visit (HOSPITAL_COMMUNITY): Payer: Self-pay

## 2021-04-22 ENCOUNTER — Other Ambulatory Visit: Payer: Self-pay

## 2021-04-22 ENCOUNTER — Encounter (INDEPENDENT_AMBULATORY_CARE_PROVIDER_SITE_OTHER): Payer: Self-pay | Admitting: Physician Assistant

## 2021-04-22 ENCOUNTER — Telehealth (INDEPENDENT_AMBULATORY_CARE_PROVIDER_SITE_OTHER): Payer: Self-pay

## 2021-04-22 ENCOUNTER — Ambulatory Visit (INDEPENDENT_AMBULATORY_CARE_PROVIDER_SITE_OTHER): Payer: 59 | Admitting: Physician Assistant

## 2021-04-22 VITALS — BP 101/67 | HR 79 | Temp 98.1°F | Ht <= 58 in | Wt 134.0 lb

## 2021-04-22 DIAGNOSIS — E1140 Type 2 diabetes mellitus with diabetic neuropathy, unspecified: Secondary | ICD-10-CM | POA: Diagnosis not present

## 2021-04-22 DIAGNOSIS — Z6827 Body mass index (BMI) 27.0-27.9, adult: Secondary | ICD-10-CM | POA: Diagnosis not present

## 2021-04-22 DIAGNOSIS — E669 Obesity, unspecified: Secondary | ICD-10-CM

## 2021-04-22 MED ORDER — OZEMPIC (2 MG/DOSE) 8 MG/3ML ~~LOC~~ SOPN
2.0000 mg | PEN_INJECTOR | SUBCUTANEOUS | 0 refills | Status: DC
  Filled 2021-04-22 – 2021-04-24 (×4): qty 3, 28d supply, fill #0

## 2021-04-22 NOTE — Progress Notes (Signed)
Chief Complaint:   OBESITY Betty Cain is here to discuss her progress with her obesity treatment plan along with follow-up of her obesity related diagnoses. Betty Cain is on the Category 2 Plan and states she is following her eating plan approximately 95% of the time. Betty Cain states she is doing cardio 30 minutes 2 times per week.  Today's visit was #: 60 Starting weight: 152 lbs Starting date: 01/10/2020 Today's weight: 134 lbs Today's date: 04/22/2021 Total lbs lost to date: 18 Total lbs lost since last in-office visit: 0  Interim History: Pt did not get her Ozempic due to a pharmacy issue. She is overeating her calories because she is hungry all of the time.  Subjective:   1. Type 2 diabetes mellitus with diabetic neuropathy, without long-term current use of insulin (HCC) Pt has not taken Ozempic in over a month due to pharmacy issues. She reports she is extensively hungry without it.  Assessment/Plan:   1. Type 2 diabetes mellitus with diabetic neuropathy, without long-term current use of insulin (HCC) Good blood sugar control is important to decrease the likelihood of diabetic complications such as nephropathy, neuropathy, limb loss, blindness, coronary artery disease, and death. Intensive lifestyle modification including diet, exercise and weight loss are the first line of treatment for diabetes.   Refill- Semaglutide, 2 MG/DOSE, (OZEMPIC, 2 MG/DOSE,) 8 MG/3ML SOPN; Inject 2 mg under the skin once a week.  Dispense: 3 mL; Refill: 0  2. Obesity: Current BMI 27.39  Betty Cain is currently in the action stage of change. As such, her goal is to continue with weight loss efforts. She has agreed to the Category 2 Plan.   Exercise goals:  As is  Behavioral modification strategies: meal planning and cooking strategies and keeping healthy foods in the home.  Betty Cain has agreed to follow-up with our clinic in 4 weeks. She was informed of the importance of frequent follow-up visits to maximize her  success with intensive lifestyle modifications for her multiple health conditions.   Objective:   Blood pressure 101/67, pulse 79, temperature 98.1 F (36.7 C), temperature source Oral, height 4\' 10"  (1.473 m), weight 134 lb (60.8 kg), SpO2 100 %. Body mass index is 28.01 kg/m.  General: Cooperative, alert, well developed, in no acute distress. HEENT: Conjunctivae and lids unremarkable. Cardiovascular: Regular rhythm.  Lungs: Normal work of breathing. Neurologic: No focal deficits.   Lab Results  Component Value Date   CREATININE 0.60 02/25/2021   BUN 10 02/25/2021   NA 140 02/25/2021   K 4.2 02/25/2021   CL 104 02/25/2021   CO2 26 02/25/2021   Lab Results  Component Value Date   ALT 11 12/22/2020   AST 14 12/22/2020   ALKPHOS 89 12/22/2020   BILITOT 0.3 12/22/2020   Lab Results  Component Value Date   HGBA1C 5.7 (A) 01/01/2021   HGBA1C 6.7 (H) 09/29/2020   HGBA1C 5.7 (A) 06/13/2020   HGBA1C 7.5 (A) 01/02/2020   HGBA1C 7.5 01/02/2020   Lab Results  Component Value Date   INSULIN 8.7 01/10/2020   Lab Results  Component Value Date   TSH 1.430 12/22/2020   Lab Results  Component Value Date   CHOL 209 (H) 12/22/2020   HDL 32 (L) 12/22/2020   LDLCALC 152 (H) 12/22/2020   TRIG 135 12/22/2020   CHOLHDL 6.5 (H) 12/22/2020   Lab Results  Component Value Date   VD25OH 30.0 01/01/2021   VD25OH 42.6 09/29/2020   VD25OH 22.9 (L) 01/10/2020  Lab Results  Component Value Date   WBC 7.0 12/22/2020   HGB 11.5 12/22/2020   HCT 35.5 12/22/2020   MCV 82 12/22/2020   PLT 424 12/22/2020   Lab Results  Component Value Date   IRON 25 (L) 01/01/2021   TIBC 344 01/01/2021   FERRITIN 20 01/01/2021    Attestation Statements:   Reviewed by clinician on day of visit: allergies, medications, problem list, medical history, surgical history, family history, social history, and previous encounter notes.  Coral Ceo, CMA, am acting as transcriptionist for [employer], PA-C.  I have reviewed the above documentation for accuracy and completeness, and I agree with the above. Abby Potash, PA-C

## 2021-04-22 NOTE — Telephone Encounter (Signed)
PA submitted via cover my meds for Ozempic.  Message from [place] Team is unable to review this request for prior authorization as there is an ongoing prior authorization case in review for this request, Case ID: [id number]. No further action is needed at this time.

## 2021-04-23 NOTE — Telephone Encounter (Signed)
Ozempic has been approved from 04/22/21 until 04/22/2022. Pt will be notified via my chart.

## 2021-04-23 NOTE — Telephone Encounter (Signed)
Clinical notes faxed to [phone], confirmation received.

## 2021-04-24 ENCOUNTER — Other Ambulatory Visit (HOSPITAL_COMMUNITY): Payer: Self-pay

## 2021-04-24 ENCOUNTER — Other Ambulatory Visit: Payer: Self-pay

## 2021-04-24 ENCOUNTER — Telehealth: Payer: Self-pay | Admitting: Interventional Cardiology

## 2021-04-24 ENCOUNTER — Other Ambulatory Visit: Payer: Self-pay | Admitting: Nurse Practitioner

## 2021-04-24 DIAGNOSIS — R079 Chest pain, unspecified: Secondary | ICD-10-CM

## 2021-04-24 DIAGNOSIS — R072 Precordial pain: Secondary | ICD-10-CM

## 2021-04-24 NOTE — Telephone Encounter (Signed)
Patient's husband is calling stating the nurse advised them she is needing labs prior to her upcoming CT. There are currently no orders placed. Please advise.

## 2021-04-24 NOTE — Telephone Encounter (Signed)
Spoke with pt and scheduled BMET for Tuesday, 1/17.  Pt appreciative for call.

## 2021-04-27 ENCOUNTER — Other Ambulatory Visit: Payer: Self-pay

## 2021-04-27 ENCOUNTER — Other Ambulatory Visit: Payer: Self-pay | Admitting: *Deleted

## 2021-04-27 ENCOUNTER — Other Ambulatory Visit: Payer: Self-pay | Admitting: Nurse Practitioner

## 2021-04-27 MED ORDER — RIMEGEPANT SULFATE 75 MG PO TBDP
ORAL_TABLET | ORAL | 3 refills | Status: DC
  Filled 2021-04-27: qty 8, 30d supply, fill #0
  Filled 2021-04-27 – 2021-07-24 (×4): qty 45, 90d supply, fill #0
  Filled 2021-07-30: qty 15, 30d supply, fill #0

## 2021-04-27 MED FILL — Rosuvastatin Calcium Tab 5 MG: ORAL | 60 days supply | Qty: 60 | Fill #3 | Status: CN

## 2021-04-27 MED FILL — Rosuvastatin Calcium Tab 5 MG: ORAL | 30 days supply | Qty: 30 | Fill #0 | Status: AC

## 2021-04-28 ENCOUNTER — Other Ambulatory Visit: Payer: Medicaid Other | Admitting: *Deleted

## 2021-04-28 ENCOUNTER — Other Ambulatory Visit: Payer: Self-pay

## 2021-04-28 ENCOUNTER — Other Ambulatory Visit (HOSPITAL_COMMUNITY): Payer: Self-pay | Admitting: *Deleted

## 2021-04-28 ENCOUNTER — Other Ambulatory Visit (HOSPITAL_COMMUNITY): Payer: Self-pay

## 2021-04-28 ENCOUNTER — Telehealth: Payer: Self-pay | Admitting: Interventional Cardiology

## 2021-04-28 ENCOUNTER — Telehealth (HOSPITAL_COMMUNITY): Payer: Self-pay | Admitting: *Deleted

## 2021-04-28 DIAGNOSIS — R079 Chest pain, unspecified: Secondary | ICD-10-CM

## 2021-04-28 DIAGNOSIS — R072 Precordial pain: Secondary | ICD-10-CM

## 2021-04-28 LAB — BASIC METABOLIC PANEL
BUN/Creatinine Ratio: 17 (ref 9–23)
BUN: 11 mg/dL (ref 6–24)
CO2: 24 mmol/L (ref 20–29)
Calcium: 10 mg/dL (ref 8.7–10.2)
Chloride: 102 mmol/L (ref 96–106)
Creatinine, Ser: 0.64 mg/dL (ref 0.57–1.00)
Glucose: 83 mg/dL (ref 70–99)
Potassium: 4.5 mmol/L (ref 3.5–5.2)
Sodium: 140 mmol/L (ref 134–144)
eGFR: 112 mL/min/{1.73_m2} (ref >59)

## 2021-04-28 MED ORDER — IVABRADINE HCL 5 MG PO TABS
ORAL_TABLET | ORAL | 0 refills | Status: DC
  Filled 2021-04-28: qty 3, 1d supply, fill #0

## 2021-04-28 NOTE — Telephone Encounter (Signed)
Reaching out to patient, with interpreter Betty Cain ID [phone]), to offer assistance regarding upcoming cardiac imaging study ; pt verbalizes understanding of appt date/time, parking situation and where to check in, pre-test NPO status and medications ordered, and verified current allergies; name and call back number provided for further questions should they arise  Betty Clement RN Navigator Cardiac Imaging [facility] [phone] office [phone] cell  Patient to take her daily dose of 100mg  metoprolol succinate in the evening and 15mg  ivabradine two hours prior to cardiac CT. She is aware to arrive at 11am for her 11:30am scan.

## 2021-04-28 NOTE — Telephone Encounter (Signed)
This morning Betty Cain came in to have her lab work for her CT on 04/30/21. Before leaving Betty Cain came in saying she needs to schedule a sleep study  per Dr Tamala Julian. I told her I did not see any mention of a sleep study ordered and cofirmed with triage that one had not been ordered yet. Betty Cain stated she had talked to Dr Tamala Julian about it  at her last visit in Nov 2022.  Please advise patient on this request.

## 2021-04-29 ENCOUNTER — Other Ambulatory Visit: Payer: Self-pay

## 2021-04-29 MED ORDER — ONDANSETRON 8 MG PO TBDP
ORAL_TABLET | ORAL | 0 refills | Status: DC
  Filled 2021-04-29: qty 21, 30d supply, fill #0
  Filled 2021-04-29: qty 30, fill #0
  Filled 2021-07-10: qty 9, 3d supply, fill #1

## 2021-04-30 ENCOUNTER — Ambulatory Visit (HOSPITAL_COMMUNITY)
Admission: RE | Admit: 2021-04-30 | Discharge: 2021-04-30 | Disposition: A | Discharge status: Home / Self Care | Payer: Medicaid Other | Source: Ambulatory Visit | Attending: Interventional Cardiology | Admitting: Interventional Cardiology

## 2021-04-30 ENCOUNTER — Other Ambulatory Visit: Payer: Self-pay

## 2021-04-30 DIAGNOSIS — R072 Precordial pain: Secondary | ICD-10-CM

## 2021-04-30 MED ORDER — DILTIAZEM HCL 25 MG/5ML IV SOLN
INTRAVENOUS | Status: AC
  Filled 2021-04-30: qty 5

## 2021-04-30 MED ORDER — IOHEXOL 350 MG/ML SOLN
95.0000 mL | Freq: Once | INTRAVENOUS | Status: AC | PRN
  Administered 2021-04-30: 95 mL via INTRAVENOUS

## 2021-04-30 MED ORDER — DILTIAZEM HCL 25 MG/5ML IV SOLN
5.0000 mg | Freq: Once | INTRAVENOUS | Status: AC
  Administered 2021-04-30: 5 mg via INTRAVENOUS

## 2021-04-30 MED ORDER — NITROGLYCERIN 0.4 MG SL SUBL
0.4000 mg | SUBLINGUAL_TABLET | Freq: Once | SUBLINGUAL | Status: AC
  Administered 2021-04-30: 0.4 mg via SUBLINGUAL

## 2021-04-30 MED ORDER — NITROGLYCERIN 0.4 MG SL SUBL
SUBLINGUAL_TABLET | SUBLINGUAL | Status: AC
  Filled 2021-04-30: qty 1

## 2021-04-30 NOTE — Progress Notes (Signed)
CT scan completed. Tolerated well. D/C home ambulatory, awake and alert, in no distress 

## 2021-05-04 ENCOUNTER — Other Ambulatory Visit: Payer: Self-pay

## 2021-05-05 ENCOUNTER — Encounter (HOSPITAL_COMMUNITY): Payer: Self-pay

## 2021-05-05 ENCOUNTER — Other Ambulatory Visit: Payer: Self-pay

## 2021-05-05 ENCOUNTER — Ambulatory Visit (HOSPITAL_COMMUNITY): Admit: 2021-05-05 | Payer: 59 | Admitting: Gastroenterology

## 2021-05-05 SURGERY — ESOPHAGOGASTRODUODENOSCOPY (EGD) WITH PROPOFOL
Anesthesia: Monitor Anesthesia Care

## 2021-05-05 NOTE — Telephone Encounter (Signed)
Spoke with pt using interpreter service Betty Cain [phone]).  Made pt aware to speak with PCP about the need for sleep study.  Pt agreeable.

## 2021-05-06 ENCOUNTER — Other Ambulatory Visit: Payer: Self-pay

## 2021-05-07 ENCOUNTER — Other Ambulatory Visit: Payer: Self-pay

## 2021-05-08 ENCOUNTER — Other Ambulatory Visit: Payer: Self-pay | Admitting: Nurse Practitioner

## 2021-05-08 DIAGNOSIS — R233 Spontaneous ecchymoses: Secondary | ICD-10-CM

## 2021-05-08 DIAGNOSIS — M79672 Pain in left foot: Secondary | ICD-10-CM

## 2021-05-11 ENCOUNTER — Other Ambulatory Visit: Payer: Self-pay

## 2021-05-11 ENCOUNTER — Ambulatory Visit (HOSPITAL_COMMUNITY)
Admission: RE | Admit: 2021-05-11 | Discharge: 2021-05-11 | Disposition: A | Discharge status: Home / Self Care | Payer: 59 | Source: Ambulatory Visit | Attending: Nurse Practitioner | Admitting: Nurse Practitioner

## 2021-05-11 ENCOUNTER — Encounter (INDEPENDENT_AMBULATORY_CARE_PROVIDER_SITE_OTHER): Payer: Self-pay | Admitting: Physician Assistant

## 2021-05-11 DIAGNOSIS — M79672 Pain in left foot: Secondary | ICD-10-CM | POA: Diagnosis not present

## 2021-05-12 ENCOUNTER — Other Ambulatory Visit: Payer: Medicaid Other

## 2021-05-12 DIAGNOSIS — R233 Spontaneous ecchymoses: Secondary | ICD-10-CM

## 2021-05-12 NOTE — Telephone Encounter (Signed)
Betty Cain 

## 2021-05-12 NOTE — Telephone Encounter (Signed)
Please advise 

## 2021-05-13 ENCOUNTER — Ambulatory Visit: Payer: Medicaid Other | Admitting: Nurse Practitioner

## 2021-05-13 LAB — CBC WITH DIFFERENTIAL/PLATELET
Basophils Absolute: 0.1 10*3/uL (ref 0.0–0.2)
Basos: 1 %
EOS (ABSOLUTE): 0.2 10*3/uL (ref 0.0–0.4)
Eos: 2 %
Hematocrit: 39.5 % (ref 34.0–46.6)
Hemoglobin: 13.5 g/dL (ref 11.1–15.9)
Immature Grans (Abs): 0 10*3/uL (ref 0.0–0.1)
Immature Granulocytes: 0 %
Lymphocytes Absolute: 3.8 10*3/uL — ABNORMAL HIGH (ref 0.7–3.1)
Lymphs: 51 %
MCH: 30.9 pg (ref 26.6–33.0)
MCHC: 34.2 g/dL (ref 31.5–35.7)
MCV: 90 fL (ref 79–97)
Monocytes Absolute: 0.3 10*3/uL (ref 0.1–0.9)
Monocytes: 4 %
Neutrophils Absolute: 3.1 10*3/uL (ref 1.4–7.0)
Neutrophils: 42 %
Platelets: 328 10*3/uL (ref 150–450)
RBC: 4.37 x10E6/uL (ref 3.77–5.28)
RDW: 13.5 % (ref 11.7–15.4)
WBC: 7.4 10*3/uL (ref 3.4–10.8)

## 2021-05-15 ENCOUNTER — Other Ambulatory Visit: Payer: Self-pay

## 2021-05-15 ENCOUNTER — Ambulatory Visit: Payer: Medicaid Other | Admitting: Gastroenterology

## 2021-05-15 ENCOUNTER — Encounter: Payer: Self-pay | Admitting: Gastroenterology

## 2021-05-15 VITALS — BP 92/60 | HR 88 | Ht <= 58 in | Wt 130.0 lb

## 2021-05-15 DIAGNOSIS — D509 Iron deficiency anemia, unspecified: Secondary | ICD-10-CM | POA: Diagnosis not present

## 2021-05-15 DIAGNOSIS — R1013 Epigastric pain: Secondary | ICD-10-CM

## 2021-05-15 DIAGNOSIS — K219 Gastro-esophageal reflux disease without esophagitis: Secondary | ICD-10-CM | POA: Diagnosis not present

## 2021-05-15 MED ORDER — METOCLOPRAMIDE HCL 5 MG PO TABS
5.0000 mg | ORAL_TABLET | Freq: Three times a day (TID) | ORAL | 1 refills | Status: DC
  Filled 2021-05-15: qty 90, 30d supply, fill #0

## 2021-05-15 MED ORDER — PANTOPRAZOLE SODIUM 40 MG PO TBEC
40.0000 mg | DELAYED_RELEASE_TABLET | Freq: Two times a day (BID) | ORAL | 5 refills | Status: DC
  Filled 2021-05-15: qty 60, 30d supply, fill #0

## 2021-05-15 NOTE — Patient Instructions (Addendum)
If you are age 44 or older, your body mass index should be between 23-30. Your Body mass index is 27.17 kg/m. If this is out of the aforementioned range listed, please consider follow up with your Primary Care Provider.  If you are age 29 or younger, your body mass index should be between 19-25. Your Body mass index is 27.17 kg/m. If this is out of the aformentioned range listed, please consider follow up with your Primary Care Provider.   ________________________________________________________  The [place] [facility] providers would like to encourage you to use [facility] to communicate with providers for non-urgent requests or questions.  Due to long hold times on the telephone, sending your provider a message by [facility] may be a faster and more efficient way to get a response.  Please allow 48 business hours for a response.  Please remember that this is for non-urgent requests.  _______________________________________________________   We will contact you when we have a date for your Endoscopy at [facility] with Dr. Havery Moros.  Please continue Protonix 40 mg: Twice a day  Take Carafate every 6 hours as needed.  Resume Reglan 5 mg: Three times a day   We will discuss stopping your Ozempic with your PCP.  Thank you for entrusting me with your care and for choosing [facility], Dr. [place] Cellar '

## 2021-05-15 NOTE — Progress Notes (Signed)
HPI :  46 year old female here for follow-up visit for dyspepsia, multiple bowel symptoms. She speaks Arabic and the in person translator did not show for her appointment today, she wanted to proceed using the virtual translator available in the office.  Recall that she previously had an H. Pylori IgG serology in April 2019  and was treated for that. He had recurrence of symptoms after treatment, was placed on Protonix and scheduled for an endoscopy with me in September. She had an EGD on January 04 2018. She had some mild gastritis noted, unfortunately her procedure was aborted due to significant laryngospasm and oxygen desaturation. Unfortunately given the procedure was aborted, biopsies not taken to confirm H. Pylori eradication. We stopped her Protonix and she submitted a stool study which was negative for H. Pylori. She's also had ongoing intermittent chest pains and has seen cardiology. Had prior EST which was normal and a negative nuclear stress test in 2020.  More recently she had a negative cardiac CT last month.  She was told she is not having cardiac origin of her symptoms.   Her main complaint previously was ongoing epigastric pain /dyspepsia. She has had an extensive evaluation with labs, US abdomen, CT scan abdomen, colonoscopy, none of which showed any pathology.  She has had some elevated inflammatory markers, but negative for any evidence of IBD. She tested negative for celiac disease.  He denies any fevers historically.    She has been tried on a variety of regimens to include Carafate, Protonix, gabapentin, topical capsaicin cream, Bentyl.  He has also been on nortriptyline 25 mg nightly.  Most recently had given her Reglan empirically.  She has had constipation in the past and most recently been on Trulance, after having been on Linzess and MiraLAX in the past.  Medication she is currently taking include Protonix 40 mg twice daily, Carafate as needed.  She is no taking any of her  Zofran.  She stopped nortriptyline.  She was given a trial of Reglan 5 mg 3 times daily and she took this for some time and states she did feel better on it, however stopped it, unclear why.  She states she was trying to minimize the amount of medication she is taking.  She has not taking Dexilant as previously ordered due to cost.  She is taking the Trulance once daily at baseline and thinks that it helps her constipation.  Of note she has been on Ozempic for the past year for her diabetes at least, she thinks it is helping her diabetes but we talked about potential side effects of that.  In general she continues to have some dyspepsia, nausea, early satiety.  No vomiting.  Weight stable.  She does have a history of iron deficiency and was given a dose of IV iron.  Her last blood counts look better with normal hemoglobin.  At our last visit we discussed if she wanted to have an EGD the to complete her evaluation especially in light of her iron deficiency.  She initially agreed to do this at the hospital and was scheduled with my partner Dr. Ardis Hughs, unfortunately the patient canceled the procedure, she was not comfortable proceeding at that time. She states she has not had menstrual cycles to cause any anemia.         Prior workup: CT abdomen pelvis 03/16/18 - normal [place] 01/15/14 - gallbladder normal EGD 01/04/18 - significant desaturation due to laryngospasm and procedure was aborted, mild gastriits, esophagus not well  evaluated     Colonoscopy 11/23/18 - The perianal and digital rectal examinations were normal. - The terminal ileum appeared normal. - The left colon was extremely tortuous. - A 3 mm polyp was found in the transverse colon. The polyp was sessile. The polyp was removed with a cold snare. Resection and retrieval were complete. - Internal hemorrhoids were found during retroflexion. - The exam was otherwise without abnormality. No obvious inflammatory changes. Of note, given the breathing  pattern observed under anesthesia for this case, high suspicion for sleep Apnea. Path shows serrated polyp - repeat colonoscopy in 5 years     Labs 01/01/21: Iron 25, TIBC 344, iron sat 7%, ferritin 20 Hgb 11.5, MCV 82   Hgb 10.6 to 11.8 from 3/21/ to 3/22    C diff ova/parasites/stool culture negative 01/02/21     RUQ [place] 06/19/19: IMPRESSION: 1. Negative for gallstones or biliary dilatation 2. Echogenic liver as may be seen with hepatic steatosis.   A1c 5.7   Cardiac CT 04/30/21 - IMPRESSION: 1. No evidence of CAD, CADRADS = 0. 2. Coronary calcium score of 0. This was 0 percentile for age and sex matched control. 3. Normal coronary origin with right dominance IMPRESSION: No significant incidental findings     Past Medical History:  Diagnosis Date   Allergy    Anemia    Anxiety 01/2019   Asthma    B12 deficiency    Back pain    Common migraine with intractable migraine 06/28/2017   Constipation    Diabetes ([place]) 02/2019   Fatigue    GERD (gastroesophageal reflux disease)    pos H pylori   Hypertension    controlled with medication   IBS (irritable bowel syndrome) 2005   Joint pain    Neonatal death    Vaginal delivery, full term-lived x2 hours.    Palpitations    Shortness of breath    Shortness of breath on exertion    Spinal headache    Swelling of both lower extremities    Valvular heart disease    Vitamin D deficiency 02/2019     Past Surgical History:  Procedure Laterality Date   ADENOIDECTOMY     and tonsils (as a child)   boil  2003   right elbow   CESAREAN SECTION     x4   CESAREAN SECTION  06/02/2011   Procedure: CESAREAN SECTION;  Surgeon: [name], MD;  Location: [facility];  Service: Gynecology;  Laterality: N/A;  Primary Cesarean Section Delivery Baby Boy @ 0004, Apgars 9/9   CESAREAN SECTION N/A 12/10/2013   Procedure: REPEAT CESAREAN SECTION;  Surgeon: [name], MD;  Location: [facility];  Service: Obstetrics;  Laterality: N/A;    CESAREAN SECTION     colonoscopy  11/23/2018   stated had issues with sleep when in for procedure   OTHER SURGICAL HISTORY  2005   Uterine surgery    uterine cauterization     Family History  Problem Relation Age of Onset   Diabetes Mother    Diabetes Father    Diabetes Sister    Anesthesia problems Neg Hx    Colon cancer Neg Hx    Esophageal cancer Neg Hx    Rectal cancer Neg Hx    Stomach cancer Neg Hx    Colon polyps Neg Hx    Social History   Tobacco Use   Smoking status: Never   Smokeless tobacco: Never  Vaping Use   Vaping Use: Never  used  Substance Use Topics   Alcohol use: No   Drug use: No   Current Outpatient Medications  Medication Sig Dispense Refill   acetaminophen (TYLENOL) 500 MG tablet Take 500 mg by mouth every 6 (six) hours as needed.     albuterol (VENTOLIN HFA) 108 (90 Base) MCG/ACT inhaler Inhale 2 puffs into the lungs every 6 (six) hours as needed for wheezing or shortness of breath. 8 g 2   Blood Pressure Monitoring DEVI 1 each by Does not apply route daily. 1 Device 0   dicyclomine (BENTYL) 10 MG/5ML solution Take 5 mLs (10 mg total) by mouth 4 (four) times daily -  before meals and at bedtime. 600 mL 0   docusate sodium (COLACE) 100 MG capsule Take 1 capsule (100 mg total) by mouth daily. 30 capsule 11   ferrous sulfate (FEROSUL) 325 (65 FE) MG tablet TAKE 1 TABLET (325 MG TOTAL) BY MOUTH DAILY. 90 tablet 3   fluticasone (FLONASE) 50 MCG/ACT nasal spray PLACE 2 SPRAYS INTO BOTH NOSTRILS DAILY. 16 g 11   Fremanezumab-vfrm 225 MG/1.5ML SOAJ INJECT 225 MG INTO THE SKIN EVERY 30 (THIRTY) DAYS. 4.5 mL 3   glucose blood (ACCU-CHEK GUIDE) test strip Use as instructed 100 each 12   ivabradine (CORLANOR) 5 MG TABS tablet Take 3 tablets by mouth two hours prior to cardiac CT scan. 3 tablet 0   levocetirizine (XYZAL) 5 MG tablet TAKE 1 TABLET (5 MG TOTAL) BY MOUTH EVERY EVENING. 90 tablet 3   methylcellulose (CITRUCEL) oral powder Take as directed, daily      metoprolol succinate (TOPROL-XL) 100 MG 24 hr tablet Take 1 tablet (100 mg total) by mouth daily. 90 tablet 3   metoprolol tartrate (LOPRESSOR) 50 MG tablet Take one tablet by mouth 2 hours prior to CT 1 tablet 0   Multiple Vitamin (MULTI VITAMIN DAILY PO) Take by mouth.     ondansetron (ZOFRAN-ODT) 8 MG disintegrating tablet TAKE 1 TABLET BY MOUTH EVERY 8 HOURS AS NEEDED FOR NAUSEA OR VOMITING. 30 tablet 0   OneTouch Delica Lancets 56L MISC 1 application by Does not apply route in the morning, at noon, in the evening, and at bedtime. 100 each 11   Plecanatide (TRULANCE) 3 MG TABS Take 3 mg by mouth daily. 30 tablet 3   potassium chloride SA (KLOR-CON) 20 MEQ tablet Take 1 tablet (20 mEq total) by mouth daily. 30 tablet 11   Rimegepant Sulfate 75 MG TBDP Take 1 tab at onset of migraine. May repeat in 2 hrs, if needed. Max dose: 2 tabs/day or 15/month. This is a 90 day rx. 45 tablet 3   rosuvastatin (CRESTOR) 5 MG tablet TAKE 1 TABLET (5 MG TOTAL) BY MOUTH AT BEDTIME. 60 tablet 11   Semaglutide, 2 MG/DOSE, (OZEMPIC, 2 MG/DOSE,) 8 MG/3ML SOPN Inject 2 mg under the skin once a week. 3 mL 0   tiZANidine (ZANAFLEX) 2 MG tablet Take 1 tablet (2 mg total) by mouth every 8 (eight) hours as needed for muscle spasms. Will need follow up for further refills.  12/29/2020 20 tablet 1   Vitamin D, Ergocalciferol, (DRISDOL) 1.25 MG (50000 UNIT) CAPS capsule TAKE 1 CAPSULE  BY MOUTH EVERY 7 DAYS. 4 capsule 0   gabapentin (NEURONTIN) 300 MG capsule TAKE 1 CAPSULE BY MOUTH IN THE MORNING AND 2 CAPSULES IN THE EVENING. 270 capsule 3   metoCLOPramide (REGLAN) 5 MG tablet Take 1 tablet (5 mg total) by mouth 3 (three) times daily before  meals. 90 tablet 1   nortriptyline (PAMELOR) 25 MG capsule Take 1 capsule (25 mg total) by mouth at bedtime. 90 capsule 3   pantoprazole (PROTONIX) 40 MG tablet Take 1 tablet (40 mg total) by mouth 2 (two) times daily before a meal. 60 tablet 5   sucralfate (CARAFATE) 1 GM/10ML suspension  Take 10 mLs (1 g total) by mouth every 6 (six) hours as needed. 420 mL 1   No current facility-administered medications for this visit.   Allergies  Allergen Reactions   Topamax [Topiramate] Itching and Rash     Review of Systems: All systems reviewed and negative except where noted in HPI.   Lab Results  Component Value Date   WBC 7.4 05/12/2021   HGB 13.5 05/12/2021   HCT 39.5 05/12/2021   MCV 90 05/12/2021   PLT 328 05/12/2021    Lab Results  Component Value Date   CREATININE 0.64 04/28/2021   BUN 11 04/28/2021   NA 140 04/28/2021   K 4.5 04/28/2021   CL 102 04/28/2021   CO2 24 04/28/2021    Lab Results  Component Value Date   ALT 11 12/22/2020   AST 14 12/22/2020   ALKPHOS 89 12/22/2020   BILITOT 0.3 12/22/2020      Physical Exam: BP 92/60    Pulse 88    Ht 4\' 10"  (1.473 m)    Wt 130 lb (59 kg)    BMI 27.17 kg/m  Constitutional: Pleasant,well-developed, female in no acute distress. Neurological: Alert and oriented to person place and time. Psychiatric: Normal mood and affect. Behavior is normal.   ASSESSMENT AND PLAN: 46 year old female here for reassessment of the following:  Dyspepsia GERD Iron deficiency anemia Constipation  As above extensive evaluation for symptoms to date.  Continues to have some symptoms although did get some benefit from Reglan.  Possible she could have some gastroparesis although I suspect dyspepsia may be more likely, perhaps related to ongoing use of his Ozempic which I do not think is helping anything.  Given her iron deficiency I do think completing the EGD is an important part of her work-up, unfortunately her last exam was aborted due to hypoxemia/laryngospasm.  We discussed if she wanted to pursue this, given her persistent symptoms and history of iron deficiency I think it is reasonable, and to do it at the hospital.  We discussed risk and benefits of the endoscopy and anesthesia, she does want to proceed with this but  only wants me to do it.  I explained that my availability to hospital is quite limited in the next few months, may not be until April or May until we can do this and she is comfortable with that.  In the interim if Reglan helped I think we should put her back on low-dose Reglan 5 mg 3 times daily as needed.  I discussed potential risks of Reglan use with her and she understands this, she seemed to tolerate it well before.  She will continue Protonix twice daily and Carafate as needed if that helps.  She will continue Trulance for constipation if that is helping.  Overall I do not think Ozempic is a great option for her diabetes given her numerous upper tract symptoms, I will reach out to her primary care to see if this is something she can wean off and switch to another regimen.  Her hemoglobin responded appropriately to iron supplementation, we will continue to monitor that over time.  Plan: - schedule EGD at  hospital with me - continue protonix 40mg  BID - continue Carafate PRN every 6 hours - resume low dose Reglan, 5mg  TID PRN, - will reach out to the patient's PCP about stopping Ozempic and considering another regimen - continue Trulance  Jolly Mango, MD [facility]

## 2021-05-18 ENCOUNTER — Other Ambulatory Visit: Payer: Self-pay

## 2021-05-18 ENCOUNTER — Encounter: Payer: Self-pay | Admitting: Nurse Practitioner

## 2021-05-18 ENCOUNTER — Ambulatory Visit: Payer: 59 | Admitting: Nurse Practitioner

## 2021-05-18 ENCOUNTER — Other Ambulatory Visit (HOSPITAL_COMMUNITY): Payer: Self-pay | Admitting: Interventional Cardiology

## 2021-05-18 VITALS — BP 115/76 | HR 84 | Temp 97.7°F | Ht <= 58 in | Wt 130.2 lb

## 2021-05-18 DIAGNOSIS — R52 Pain, unspecified: Secondary | ICD-10-CM | POA: Diagnosis not present

## 2021-05-18 DIAGNOSIS — E1165 Type 2 diabetes mellitus with hyperglycemia: Secondary | ICD-10-CM

## 2021-05-18 DIAGNOSIS — Z13220 Encounter for screening for lipoid disorders: Secondary | ICD-10-CM

## 2021-05-18 DIAGNOSIS — R5383 Other fatigue: Secondary | ICD-10-CM

## 2021-05-18 DIAGNOSIS — R202 Paresthesia of skin: Secondary | ICD-10-CM | POA: Diagnosis not present

## 2021-05-18 DIAGNOSIS — D509 Iron deficiency anemia, unspecified: Secondary | ICD-10-CM | POA: Diagnosis not present

## 2021-05-18 MED ORDER — KETOROLAC TROMETHAMINE 60 MG/2ML IM SOLN
60.0000 mg | Freq: Once | INTRAMUSCULAR | Status: AC
  Administered 2021-05-18: 60 mg via INTRAMUSCULAR

## 2021-05-18 NOTE — Progress Notes (Signed)
[place], [place]  [address] Phone:  [phone]   Fax:  [phone]   Established Patient Office Visit  Subjective:  Patient ID: Betty Cain, female    DOB: [birth year]  Age: 46 y.o. MRN: [id number]  CC:  Chief Complaint  Patient presents with   Follow-up    Pt is here for her follow up visit and discuss lab results. Pt states that she has been having some dizziness off and on x 4 days.    HPI Milton Sleight presents to discuss labs. She  has a past medical history of Allergy, Anemia, Anxiety (01/2019), Asthma, B12 deficiency, Back pain, Common migraine with intractable migraine (06/28/2017), Constipation, Diabetes ([place]) (02/2019), Fatigue, GERD (gastroesophageal reflux disease), Hypertension, IBS (irritable bowel syndrome) 2003/06/28), Joint pain, Neonatal death, Palpitations, Shortness of breath, Shortness of breath on exertion, Spinal headache, Swelling of both lower extremities, Valvular heart disease, and Vitamin D deficiency (02/2019).   She is having constant generalized pain for 4 days. She denies any injury to neck or back. She is having numbness and tingling down her arms, and legs into her toes bilaterally L>R.  The pain has worsened. She has taken APAP with little effectiveness. She reports that the pain has been longstanding. She continues on Gabapentin 300 mg (900 mg/day).   She sent a MyChart message on last week related to bruising on her feet. She denied any injury She had an xray of her foot which was normal. She is not complaining of this pain today.   She is also complaining of ear pain and sore throat. Denies fever, headache, cough, wheezing, shortness of breath, chest pains.  Past Medical History:  Diagnosis Date   Allergy    Anemia    Anxiety 01/2019   Asthma    B12 deficiency    Back pain    Common migraine with intractable migraine 06/28/2017   Constipation    Diabetes ([place]) 02/2019   Fatigue    GERD  (gastroesophageal reflux disease)    pos H pylori   Hypertension    controlled with medication   IBS (irritable bowel syndrome) June 28, 2003   Joint pain    Neonatal death    Vaginal delivery, full term-lived x2 hours.    Palpitations    Shortness of breath    Shortness of breath on exertion    Spinal headache    Swelling of both lower extremities    Valvular heart disease    Vitamin D deficiency 02/2019    Past Surgical History:  Procedure Laterality Date   ADENOIDECTOMY     and tonsils (as a child)   boil  06-27-01   right elbow   CESAREAN SECTION     x4   CESAREAN SECTION  06/02/2011   Procedure: CESAREAN SECTION;  Surgeon: [name], MD;  Location: [facility];  Service: Gynecology;  Laterality: N/A;  Primary Cesarean Section Delivery Baby Boy @ 0004, Apgars 9/9   CESAREAN SECTION N/A 12/10/2013   Procedure: REPEAT CESAREAN SECTION;  Surgeon: [name], MD;  Location: [facility];  Service: Obstetrics;  Laterality: N/A;   CESAREAN SECTION     colonoscopy  11/23/2018   stated had issues with sleep when in for procedure   OTHER SURGICAL HISTORY  06-28-2003   Uterine surgery    uterine cauterization      Family History  Problem Relation Age of Onset   Diabetes Mother    Diabetes Father  Diabetes Sister    Anesthesia problems Neg Hx    Colon cancer Neg Hx    Esophageal cancer Neg Hx    Rectal cancer Neg Hx    Stomach cancer Neg Hx    Colon polyps Neg Hx     Social History   Socioeconomic History   Marital status: Married    Spouse name: [name]   Number of children: 4   Years of education: Not on file   Highest education level: Not on file  Occupational History   Occupation: Unemployed  Tobacco Use   Smoking status: Never   Smokeless tobacco: Never  Vaping Use   Vaping Use: Never used  Substance and Sexual Activity   Alcohol use: No   Drug use: No   Sexual activity: Yes    Birth control/protection: None, Implant    Comment: pregnant  Other Topics Concern    Not on file  Social History Narrative   Lives with husband and child   Caffeine use: daily (tea), sometimes coffee/soda   Right handed    Social Determinants of Health   Financial Resource Strain: Not on file  Food Insecurity: Not on file  Transportation Needs: Not on file  Physical Activity: Not on file  Stress: Not on file  Social Connections: Not on file  Intimate Partner Violence: Not on file    Outpatient Medications Prior to Visit  Medication Sig Dispense Refill   acetaminophen (TYLENOL) 500 MG tablet Take 500 mg by mouth every 6 (six) hours as needed.     albuterol (VENTOLIN HFA) 108 (90 Base) MCG/ACT inhaler Inhale 2 puffs into the lungs every 6 (six) hours as needed for wheezing or shortness of breath. 8 g 2   Blood Pressure Monitoring DEVI 1 each by Does not apply route daily. 1 Device 0   dicyclomine (BENTYL) 10 MG/5ML solution Take 5 mLs (10 mg total) by mouth 4 (four) times daily -  before meals and at bedtime. 600 mL 0   docusate sodium (COLACE) 100 MG capsule Take 1 capsule (100 mg total) by mouth daily. 30 capsule 11   ferrous sulfate (FEROSUL) 325 (65 FE) MG tablet TAKE 1 TABLET (325 MG TOTAL) BY MOUTH DAILY. 90 tablet 3   fluticasone (FLONASE) 50 MCG/ACT nasal spray PLACE 2 SPRAYS INTO BOTH NOSTRILS DAILY. 16 g 11   Fremanezumab-vfrm 225 MG/1.5ML SOAJ INJECT 225 MG INTO THE SKIN EVERY 30 (THIRTY) DAYS. 4.5 mL 3   glucose blood (ACCU-CHEK GUIDE) test strip Use as instructed 100 each 12   ivabradine (CORLANOR) 5 MG TABS tablet Take 3 tablets by mouth two hours prior to cardiac CT scan. 3 tablet 0   levocetirizine (XYZAL) 5 MG tablet TAKE 1 TABLET (5 MG TOTAL) BY MOUTH EVERY EVENING. 90 tablet 3   methylcellulose (CITRUCEL) oral powder Take as directed, daily     metoCLOPramide (REGLAN) 5 MG tablet Take 1 tablet (5 mg total) by mouth 3 (three) times daily before meals. 90 tablet 1   metoprolol succinate (TOPROL-XL) 100 MG 24 hr tablet Take 1 tablet (100 mg total) by  mouth daily. 90 tablet 3   metoprolol tartrate (LOPRESSOR) 50 MG tablet Take one tablet by mouth 2 hours prior to CT 1 tablet 0   Multiple Vitamin (MULTI VITAMIN DAILY PO) Take by mouth.     ondansetron (ZOFRAN-ODT) 8 MG disintegrating tablet TAKE 1 TABLET BY MOUTH EVERY 8 HOURS AS NEEDED FOR NAUSEA OR VOMITING. 30 tablet 0   OneTouch Delica  Lancets 53I MISC 1 application by Does not apply route in the morning, at noon, in the evening, and at bedtime. 100 each 11   pantoprazole (PROTONIX) 40 MG tablet Take 1 tablet (40 mg total) by mouth 2 (two) times daily before a meal. 60 tablet 5   Plecanatide (TRULANCE) 3 MG TABS Take 3 mg by mouth daily. 30 tablet 3   potassium chloride SA (KLOR-CON) 20 MEQ tablet Take 1 tablet (20 mEq total) by mouth daily. 30 tablet 11   Rimegepant Sulfate 75 MG TBDP Take 1 tab at onset of migraine. May repeat in 2 hrs, if needed. Max dose: 2 tabs/day or 15/month. This is a 90 day rx. 45 tablet 3   rosuvastatin (CRESTOR) 5 MG tablet TAKE 1 TABLET (5 MG TOTAL) BY MOUTH AT BEDTIME. 60 tablet 11   Semaglutide, 2 MG/DOSE, (OZEMPIC, 2 MG/DOSE,) 8 MG/3ML SOPN Inject 2 mg under the skin once a week. 3 mL 0   sucralfate (CARAFATE) 1 GM/10ML suspension Take 10 mLs (1 g total) by mouth every 6 (six) hours as needed. 420 mL 1   tiZANidine (ZANAFLEX) 2 MG tablet Take 1 tablet (2 mg total) by mouth every 8 (eight) hours as needed for muscle spasms. Will need follow up for further refills.  12/29/2020 20 tablet 1   Vitamin D, Ergocalciferol, (DRISDOL) 1.25 MG (50000 UNIT) CAPS capsule TAKE 1 CAPSULE  BY MOUTH EVERY 7 DAYS. 4 capsule 0   gabapentin (NEURONTIN) 300 MG capsule TAKE 1 CAPSULE BY MOUTH IN THE MORNING AND 2 CAPSULES IN THE EVENING. 270 capsule 3   nortriptyline (PAMELOR) 25 MG capsule Take 1 capsule (25 mg total) by mouth at bedtime. 90 capsule 3   No facility-administered medications prior to visit.    Allergies  Allergen Reactions   Topamax [Topiramate] Itching and Rash     ROS Review of Systems  Constitutional:  Positive for fatigue (worsening).  Eyes:  Positive for visual disturbance.  Musculoskeletal:        Generalized pain   Neurological:  Positive for dizziness and numbness.     Objective:    Physical Exam HENT:     Head: Normocephalic and atraumatic.  Cardiovascular:     Rate and Rhythm: Normal rate and regular rhythm.     Pulses: Normal pulses.     Heart sounds: Normal heart sounds.  Musculoskeletal:        General: Normal range of motion.     Cervical back: Normal range of motion.  Feet:     Right foot:     Protective Sensation: 10 sites tested.  10 sites sensed.     Toenail Condition: Right toenails are normal.     Left foot:     Protective Sensation: 10 sites tested.  10 sites sensed.     Toenail Condition: Left toenails are normal.  Skin:    General: Skin is warm.  Neurological:     Mental Status: She is alert.    BP 115/76    Pulse 84    Temp 97.7 F (36.5 C)    Ht 4' 10"  (1.473 m)    Wt 130 lb 3.2 oz (59.1 kg)    SpO2 100%    BMI 27.21 kg/m  Wt Readings from Last 3 Encounters:  05/18/21 130 lb 3.2 oz (59.1 kg)  05/15/21 130 lb (59 kg)  04/22/21 134 lb (60.8 kg)     Health Maintenance Due  Topic Date Due   FOOT EXAM  04/02/2021   URINE MICROALBUMIN  06/13/2021    There are no preventive care reminders to display for this patient.  Lab Results  Component Value Date   TSH 1.430 12/22/2020   Lab Results  Component Value Date   WBC 7.4 05/12/2021   HGB 13.5 05/12/2021   HCT 39.5 05/12/2021   MCV 90 05/12/2021   PLT 328 05/12/2021   Lab Results  Component Value Date   NA 140 04/28/2021   K 4.5 04/28/2021   CO2 24 04/28/2021   GLUCOSE 83 04/28/2021   BUN 11 04/28/2021   CREATININE 0.64 04/28/2021   BILITOT 0.3 12/22/2020   ALKPHOS 89 12/22/2020   AST 14 12/22/2020   ALT 11 12/22/2020   PROT 7.5 12/22/2020   ALBUMIN 4.7 12/22/2020   CALCIUM 10.0 04/28/2021   ANIONGAP 11 08/30/2018   EGFR 112  04/28/2021   GFR 119.42 08/28/2018   Lab Results  Component Value Date   CHOL 209 (H) 12/22/2020   Lab Results  Component Value Date   HDL 32 (L) 12/22/2020   Lab Results  Component Value Date   LDLCALC 152 (H) 12/22/2020   Lab Results  Component Value Date   TRIG 135 12/22/2020   Lab Results  Component Value Date   CHOLHDL 6.5 (H) 12/22/2020   Lab Results  Component Value Date   HGBA1C 5.7 (A) 01/01/2021      Assessment & Plan:   Problem List Items Addressed This Visit       Endocrine   Type 2 diabetes mellitus with hyperglycemia, without long-term current use of insulin (HCC) Stable  Screening of microabumin needed.    Relevant Orders   Comp. Metabolic Panel (12)   Microalbumin/Creatinine Ratio, Urine     Other   IDA (iron deficiency anemia) Screening due to fatigue   Relevant Orders   Iron, TIBC and Ferritin Panel   Other Visit Diagnoses     Generalized pain    -  Primary Worsening    Relevant Medications   ketorolac (TORADOL) injection 60 mg (Completed)   Screening for cholesterol level       Relevant Orders   Lipid panel   Fatigue, unspecified type       Paresthesia    Would like her to be evaluated for fibromyalgia.  Currently on Gabapentin with minimal relief  Declines depression       Meds ordered this encounter  Medications   ketorolac (TORADOL) injection 60 mg    Follow-up: Return in about 6 months (around 11/15/2021).    Vevelyn Francois, NP

## 2021-05-18 NOTE — Patient Instructions (Signed)

## 2021-05-19 ENCOUNTER — Other Ambulatory Visit: Payer: Medicaid Other

## 2021-05-19 ENCOUNTER — Other Ambulatory Visit: Payer: Self-pay

## 2021-05-19 DIAGNOSIS — E1165 Type 2 diabetes mellitus with hyperglycemia: Secondary | ICD-10-CM | POA: Diagnosis not present

## 2021-05-19 DIAGNOSIS — Z13220 Encounter for screening for lipoid disorders: Secondary | ICD-10-CM | POA: Diagnosis not present

## 2021-05-19 DIAGNOSIS — D509 Iron deficiency anemia, unspecified: Secondary | ICD-10-CM | POA: Diagnosis not present

## 2021-05-20 ENCOUNTER — Other Ambulatory Visit (HOSPITAL_COMMUNITY): Payer: Self-pay

## 2021-05-20 ENCOUNTER — Other Ambulatory Visit: Payer: Self-pay

## 2021-05-20 ENCOUNTER — Encounter (INDEPENDENT_AMBULATORY_CARE_PROVIDER_SITE_OTHER): Payer: Self-pay | Admitting: Physician Assistant

## 2021-05-20 ENCOUNTER — Ambulatory Visit (INDEPENDENT_AMBULATORY_CARE_PROVIDER_SITE_OTHER): Payer: 59 | Admitting: Physician Assistant

## 2021-05-20 VITALS — BP 98/65 | HR 85 | Temp 98.9°F | Ht <= 58 in | Wt 125.0 lb

## 2021-05-20 DIAGNOSIS — E1140 Type 2 diabetes mellitus with diabetic neuropathy, unspecified: Secondary | ICD-10-CM | POA: Diagnosis not present

## 2021-05-20 DIAGNOSIS — Z7985 Long-term (current) use of injectable non-insulin antidiabetic drugs: Secondary | ICD-10-CM

## 2021-05-20 DIAGNOSIS — E669 Obesity, unspecified: Secondary | ICD-10-CM

## 2021-05-20 DIAGNOSIS — E559 Vitamin D deficiency, unspecified: Secondary | ICD-10-CM

## 2021-05-20 DIAGNOSIS — Z6827 Body mass index (BMI) 27.0-27.9, adult: Secondary | ICD-10-CM | POA: Diagnosis not present

## 2021-05-20 DIAGNOSIS — Z6831 Body mass index (BMI) 31.0-31.9, adult: Secondary | ICD-10-CM

## 2021-05-20 LAB — COMP. METABOLIC PANEL (12)
AST: 13 IU/L (ref 0–40)
Albumin/Globulin Ratio: 1.7 (ref 1.2–2.2)
Albumin: 4.8 g/dL (ref 3.8–4.8)
Alkaline Phosphatase: 80 IU/L (ref 44–121)
BUN/Creatinine Ratio: 17 (ref 9–23)
BUN: 12 mg/dL (ref 6–24)
Bilirubin Total: 0.5 mg/dL (ref 0.0–1.2)
Calcium: 9.5 mg/dL (ref 8.7–10.2)
Chloride: 104 mmol/L (ref 96–106)
Creatinine, Ser: 0.71 mg/dL (ref 0.57–1.00)
Globulin, Total: 2.9 g/dL (ref 1.5–4.5)
Glucose: 94 mg/dL (ref 70–99)
Potassium: 4.2 mmol/L (ref 3.5–5.2)
Sodium: 142 mmol/L (ref 134–144)
Total Protein: 7.7 g/dL (ref 6.0–8.5)
eGFR: 108 mL/min/{1.73_m2} (ref >59)

## 2021-05-20 LAB — LIPID PANEL
Chol/HDL Ratio: 3.2 ratio (ref 0.0–4.4)
Cholesterol, Total: 120 mg/dL (ref 100–199)
HDL: 37 mg/dL — ABNORMAL LOW (ref >39)
LDL Chol Calc (NIH): 66 mg/dL (ref 0–99)
Triglycerides: 84 mg/dL (ref 0–149)
VLDL Cholesterol Cal: 17 mg/dL (ref 5–40)

## 2021-05-20 LAB — MICROALBUMIN / CREATININE URINE RATIO
Creatinine, Urine: 112.6 mg/dL
Microalb/Creat Ratio: 27 mg/g creat (ref 0–29)
Microalbumin, Urine: 30.7 ug/mL

## 2021-05-20 LAB — IRON,TIBC AND FERRITIN PANEL
Ferritin: 478 ng/mL — ABNORMAL HIGH (ref 15–150)
Iron Saturation: 36 % (ref 15–55)
Iron: 83 ug/dL (ref 27–159)
Total Iron Binding Capacity: 230 ug/dL — ABNORMAL LOW (ref 250–450)
UIBC: 147 ug/dL (ref 131–425)

## 2021-05-20 MED ORDER — VITAMIN D (ERGOCALCIFEROL) 1.25 MG (50000 UNIT) PO CAPS
ORAL_CAPSULE | ORAL | 0 refills | Status: DC
  Filled 2021-05-20: qty 4, 28d supply, fill #0

## 2021-05-20 NOTE — Progress Notes (Signed)
Chief Complaint:   OBESITY Betty Cain is here to discuss her progress with her obesity treatment plan along with follow-up of her obesity related diagnoses. Betty Cain is on the Category 2 Plan and states she is following her eating plan approximately 90% of the time. Betty Cain states she is not currently exercising.  Today's visit was #: 20 Starting weight: 152 lbs Starting date: 01/10/2020 Today's weight: 125 lbs Today's date: 05/20/2021 Total lbs lost to date: 27 Total lbs lost since last in-office visit: 9  Interim History: She has done well with weight loss since restarting ozempic 4 weeks ago, after she she was off of it for the 4 weeks prior.  She reports that she had epigastric pain while off the the ozempic, but when she restarted ozempic, it became worse. She has seen GI for this issue. Pt states that after starting Reglan 2 days ago, the epigastric pain is much improved, but not resolved. She states it's more pain than burning, but that at times it can be a burning sensation. It begins when she wakes up and may last for a couple of hours. Pt endorses nausea without vomiting. She denies blood in stool or black stools but noticed mucus in her stool yesterday. Pt denies problems swallowing. She is adamant about continuing Ozempic and does not want to take "another pill.". She is very hesitant at this time to discontinue ozempic because she has lost weight while using it and states it is the only thing that controls her appetite. When she was off of it for one month, she gained 4 pounds and reported that her appetite was out of control.  She will have EGD 4/23, per patient .   Subjective:   1. Type 2 diabetes mellitus with diabetic neuropathy, without long-term current use of insulin (HCC) Pt is back on Ozempic 2 mg for there last 4 weeks after not taking it for 4 weeks (due to lack of supply). Controls appetite well but worsens epigastric discomfort when taking. She is adamant about continuing ozempic  despite reflux, and despite education on possible long term effects of reflux.   2. Vitamin D deficiency Leslieanne denies nausea, vomiting, and muscle weakness and is on prescription Vit D.  Assessment/Plan:   1. Type 2 diabetes mellitus with diabetic neuropathy, without long-term current use of insulin (HCC) Good blood sugar control is important to decrease the likelihood of diabetic complications such as nephropathy, neuropathy, limb loss, blindness, coronary artery disease, and death. Intensive lifestyle modification including diet, exercise and weight loss are the first line of treatment for diabetes. Advised patient of dangers of uncontrolled reflux moving forward. Will decrease Ozempic to 1mg  weekly for now, #4 with no RF. Patient education given on possible long term effects of reflux. She will have an EGD in April, per patient. Will consider changing to Trlulicity, as some patients may not have reflux symptoms on Trulicity. Follow up with GI. Will reassess appetite and reflux symptoms next visit.   2. Vitamin D deficiency Low Vitamin D level contributes to fatigue and are associated with obesity, breast, and colon cancer. She agrees to continue to take prescription Vitamin D @50 ,000 IU every week and will follow-up for routine testing of Vitamin D, at least 2-3 times per year to avoid over-replacement.  Refill- Vitamin D, Ergocalciferol, (DRISDOL) 1.25 MG (50000 UNIT) CAPS capsule; TAKE 1 CAPSULE  BY MOUTH EVERY 7 DAYS.  Dispense: 4 capsule; Refill: 0  3. Obesity: Current BMI 27.39 Trang is currently  in the action stage of change. As such, her goal is to continue with weight loss efforts. She has agreed to the Category 2 Plan.   Exercise goals:  As is  Behavioral modification strategies: meal planning and cooking strategies and keeping healthy foods in the home.  Cherron has agreed to follow-up with our clinic in 4 weeks. She was informed of the importance of frequent follow-up visits to  maximize her success with intensive lifestyle modifications for her multiple health conditions.   Objective:   Blood pressure 98/65, pulse 85, temperature 98.9 F (37.2 C), height 4\' 10"  (1.473 m), weight 125 lb (56.7 kg), SpO2 100 %. Body mass index is 26.13 kg/m.  General: Cooperative, alert, well developed, in no acute distress. HEENT: Conjunctivae and lids unremarkable. Cardiovascular: Regular rhythm.  Lungs: Normal work of breathing. Neurologic: No focal deficits.   Lab Results  Component Value Date   CREATININE 0.71 05/19/2021   BUN 12 05/19/2021   NA 142 05/19/2021   K 4.2 05/19/2021   CL 104 05/19/2021   CO2 24 04/28/2021   Lab Results  Component Value Date   ALT 11 12/22/2020   AST 13 05/19/2021   ALKPHOS 80 05/19/2021   BILITOT 0.5 05/19/2021   Lab Results  Component Value Date   HGBA1C 5.7 (A) 01/01/2021   HGBA1C 6.7 (H) 09/29/2020   HGBA1C 5.7 (A) 06/13/2020   HGBA1C 7.5 (A) 01/02/2020   HGBA1C 7.5 01/02/2020   Lab Results  Component Value Date   INSULIN 8.7 01/10/2020   Lab Results  Component Value Date   TSH 1.430 12/22/2020   Lab Results  Component Value Date   CHOL 120 05/19/2021   HDL 37 (L) 05/19/2021   LDLCALC 66 05/19/2021   TRIG 84 05/19/2021   CHOLHDL 3.2 05/19/2021   Lab Results  Component Value Date   VD25OH 30.0 01/01/2021   VD25OH 42.6 09/29/2020   VD25OH 22.9 (L) 01/10/2020   Lab Results  Component Value Date   WBC 7.4 05/12/2021   HGB 13.5 05/12/2021   HCT 39.5 05/12/2021   MCV 90 05/12/2021   PLT 328 05/12/2021   Lab Results  Component Value Date   IRON 83 05/19/2021   TIBC 230 (L) 05/19/2021   FERRITIN 478 (H) 05/19/2021   Attestation Statements:   Reviewed by clinician on day of visit: allergies, medications, problem list, medical history, surgical history, family history, social history, and previous encounter notes.  Coral Ceo, CMA, am acting as transcriptionist for [employer], PA-C.  I have  reviewed the above documentation for accuracy and completeness, and I agree with the above. Abby Potash, PA-C

## 2021-05-21 ENCOUNTER — Other Ambulatory Visit: Payer: Self-pay

## 2021-05-21 MED ORDER — SEMAGLUTIDE (1 MG/DOSE) 4 MG/3ML ~~LOC~~ SOPN
1.0000 mg | PEN_INJECTOR | SUBCUTANEOUS | 0 refills | Status: DC
  Filled 2021-05-21: qty 3, 28d supply, fill #0

## 2021-05-25 ENCOUNTER — Other Ambulatory Visit: Payer: Self-pay

## 2021-05-25 NOTE — Telephone Encounter (Signed)
Maudie Mercury, can you please advise on the bill. Thank you.  Lisabeth Pick

## 2021-05-26 ENCOUNTER — Encounter: Payer: Self-pay | Admitting: Gastroenterology

## 2021-05-28 ENCOUNTER — Other Ambulatory Visit: Payer: Self-pay

## 2021-05-28 MED ORDER — METOCLOPRAMIDE HCL 5 MG PO TABS
5.0000 mg | ORAL_TABLET | Freq: Three times a day (TID) | ORAL | 3 refills | Status: DC
  Filled 2021-05-28 – 2021-07-30 (×4): qty 90, 30d supply, fill #0

## 2021-06-01 ENCOUNTER — Other Ambulatory Visit: Payer: Self-pay

## 2021-06-01 MED ORDER — SUCRALFATE 1 GM/10ML PO SUSP
1.0000 g | Freq: Four times a day (QID) | ORAL | 1 refills | Status: DC | PRN
  Filled 2021-06-01: qty 420, 11d supply, fill #0

## 2021-06-01 MED ORDER — PANTOPRAZOLE SODIUM 40 MG PO TBEC
40.0000 mg | DELAYED_RELEASE_TABLET | Freq: Two times a day (BID) | ORAL | 3 refills | Status: DC
  Filled 2021-06-01: qty 60, 30d supply, fill #0

## 2021-06-02 ENCOUNTER — Other Ambulatory Visit: Payer: Self-pay

## 2021-06-02 MED ORDER — SUCRALFATE 1 G PO TABS
1.0000 g | ORAL_TABLET | Freq: Four times a day (QID) | ORAL | 2 refills | Status: DC | PRN
  Filled 2021-06-02: qty 60, 15d supply, fill #0

## 2021-06-06 DIAGNOSIS — Z20822 Contact with and (suspected) exposure to covid-19: Secondary | ICD-10-CM | POA: Diagnosis not present

## 2021-06-06 DIAGNOSIS — J069 Acute upper respiratory infection, unspecified: Secondary | ICD-10-CM | POA: Diagnosis not present

## 2021-06-08 ENCOUNTER — Other Ambulatory Visit: Payer: Self-pay | Admitting: Nurse Practitioner

## 2021-06-08 ENCOUNTER — Other Ambulatory Visit: Payer: Self-pay

## 2021-06-08 MED ORDER — ALBUTEROL SULFATE HFA 108 (90 BASE) MCG/ACT IN AERS
2.0000 | INHALATION_SPRAY | Freq: Four times a day (QID) | RESPIRATORY_TRACT | 2 refills | Status: DC | PRN
  Filled 2021-06-08: qty 18, 25d supply, fill #0

## 2021-06-08 MED ORDER — NORGESTIMATE-ETH ESTRADIOL 0.25-35 MG-MCG PO TABS
1.0000 | ORAL_TABLET | Freq: Every day | ORAL | 11 refills | Status: DC
  Filled 2021-06-08: qty 28, 28d supply, fill #0
  Filled 2021-07-10: qty 28, 28d supply, fill #1

## 2021-06-12 ENCOUNTER — Other Ambulatory Visit: Payer: Self-pay

## 2021-06-12 MED FILL — Rosuvastatin Calcium Tab 5 MG: ORAL | 30 days supply | Qty: 30 | Fill #1 | Status: AC

## 2021-06-17 ENCOUNTER — Encounter (INDEPENDENT_AMBULATORY_CARE_PROVIDER_SITE_OTHER): Payer: Self-pay | Admitting: Physician Assistant

## 2021-06-17 ENCOUNTER — Other Ambulatory Visit: Payer: Self-pay

## 2021-06-17 ENCOUNTER — Ambulatory Visit (INDEPENDENT_AMBULATORY_CARE_PROVIDER_SITE_OTHER): Payer: 59 | Admitting: Physician Assistant

## 2021-06-17 VITALS — BP 97/67 | HR 89 | Temp 98.1°F | Ht <= 58 in | Wt 124.0 lb

## 2021-06-17 DIAGNOSIS — E559 Vitamin D deficiency, unspecified: Secondary | ICD-10-CM

## 2021-06-17 DIAGNOSIS — E1140 Type 2 diabetes mellitus with diabetic neuropathy, unspecified: Secondary | ICD-10-CM | POA: Diagnosis not present

## 2021-06-17 DIAGNOSIS — E669 Obesity, unspecified: Secondary | ICD-10-CM

## 2021-06-17 DIAGNOSIS — Z6825 Body mass index (BMI) 25.0-25.9, adult: Secondary | ICD-10-CM

## 2021-06-17 DIAGNOSIS — Z6831 Body mass index (BMI) 31.0-31.9, adult: Secondary | ICD-10-CM

## 2021-06-17 DIAGNOSIS — Z7985 Long-term (current) use of injectable non-insulin antidiabetic drugs: Secondary | ICD-10-CM | POA: Diagnosis not present

## 2021-06-17 MED ORDER — VITAMIN D (ERGOCALCIFEROL) 1.25 MG (50000 UNIT) PO CAPS
ORAL_CAPSULE | ORAL | 0 refills | Status: DC
  Filled 2021-06-17: qty 4, 28d supply, fill #0

## 2021-06-17 MED ORDER — SEMAGLUTIDE (1 MG/DOSE) 4 MG/3ML ~~LOC~~ SOPN
1.0000 mg | PEN_INJECTOR | SUBCUTANEOUS | 0 refills | Status: DC
  Filled 2021-06-17: qty 3, 28d supply, fill #0

## 2021-06-18 NOTE — Progress Notes (Signed)
? ? ? ?Chief Complaint:  ? ?OBESITY ?Betty Cain is here to discuss her progress with her obesity treatment plan along with follow-up of her obesity related diagnoses. Betty Cain is on the Category 2 Plan and states she is following her eating plan approximately 75% of the time. Betty Cain states she is doing 0 minutes 0 times per week. ? ?Today's visit was #: 21 ?Starting weight: 152 lbs ?Starting date: 01/10/2020 ?Today's weight: 124 lbs ?Today's date: 06/17/2021 ?Total lbs lost to date: 28 lbs ?Total lbs lost since last in-office visit: 1 lb ? ?Interim History: Betty Cain reports a reduction in epigastric discomfort since last visit. She is attributing the improvement to taking Reglan as prescribed. Ramadan is coming up and she is asking about fasting.  ? ?Subjective:  ? ?1. Type 2 diabetes mellitus with diabetic neuropathy, without long-term current use of insulin (Betty Cain) ?Betty Cain is on Ozempic 1 mg. Her hunger is controlled. Her last A1C was 5.7. ? ?2. Vitamin D deficiency ?Betty Cain is on Vitamin D weekly and she is tolerating it well.  ? ?Assessment/Plan:  ? ?1. Type 2 diabetes mellitus with diabetic neuropathy, without long-term current use of insulin (Betty Cain) ?We will refill Ozempic 1 mg for 1 month with no refills. Good blood sugar control is important to decrease the likelihood of diabetic complications such as nephropathy, neuropathy, limb loss, blindness, coronary artery disease, and death. Intensive lifestyle modification including diet, exercise and weight loss are the first line of treatment for diabetes.  ? ?- Semaglutide, 1 MG/DOSE, 4 MG/3ML SOPN; Inject 1 mg as directed once a week.  Dispense: 3 mL; Refill: 0 ? ?2. Vitamin D deficiency ?Low Vitamin D level contributes to fatigue and are associated with obesity, breast, and colon cancer. We will refill prescription Vitamin D 50,000 IU every week for 1 month with no refills and Betty Cain will follow-up for routine testing of Vitamin D, at least 2-3 times per year to avoid  over-replacement. ? ?- Vitamin D, Ergocalciferol, (DRISDOL) 1.25 MG (50000 UNIT) CAPS capsule; TAKE 1 CAPSULE  BY MOUTH EVERY 7 DAYS.  Dispense: 4 capsule; Refill: 0 ? ?3. Obesity: Current BMI 25.92 ?Betty Cain is currently in the action stage of change. As such, her goal is to continue with weight loss efforts. She has agreed to the Category 2 Plan.  ? ?Betty Cain was not fasting due to diabetes. I explained to her it can be dangerous for her blood sugars cause days too low. ? ?Exercise goals: No exercise has been prescribed at this time. ? ?Behavioral modification strategies: no skipping meals and planning for success. ? ?Betty Cain has agreed to follow-up with our clinic in 4 weeks. She was informed of the importance of frequent follow-up visits to maximize her success with intensive lifestyle modifications for her multiple health conditions.  ? ?Objective:  ? ?Blood pressure 97/67, pulse 89, temperature 98.1 ?F (36.7 ?C), height '4\' 10"'$  (1.473 m), weight 124 lb (56.2 kg), SpO2 100 %. ?Body mass index is 25.92 kg/m?. ? ?General: Cooperative, alert, well developed, in no acute distress. ?HEENT: Conjunctivae and lids unremarkable. ?Cardiovascular: Regular rhythm.  ?Lungs: Normal work of breathing. ?Neurologic: No focal deficits.  ? ?Lab Results  ?Component Value Date  ? CREATININE 0.71 05/19/2021  ? BUN 12 05/19/2021  ? NA 142 05/19/2021  ? K 4.2 05/19/2021  ? CL 104 05/19/2021  ? CO2 24 04/28/2021  ? ?Lab Results  ?Component Value Date  ? ALT 11 12/22/2020  ? AST 13 05/19/2021  ? ALKPHOS 80 05/19/2021  ?  BILITOT 0.5 05/19/2021  ? ?Lab Results  ?Component Value Date  ? HGBA1C 5.7 (A) 01/01/2021  ? HGBA1C 6.7 (H) 09/29/2020  ? HGBA1C 5.7 (A) 06/13/2020  ? HGBA1C 7.5 (A) 01/02/2020  ? HGBA1C 7.5 01/02/2020  ? ?Lab Results  ?Component Value Date  ? INSULIN 8.7 01/10/2020  ? ?Lab Results  ?Component Value Date  ? TSH 1.430 12/22/2020  ? ?Lab Results  ?Component Value Date  ? CHOL 120 05/19/2021  ? HDL 37 (L) 05/19/2021  ? [place] 66  05/19/2021  ? TRIG 84 05/19/2021  ? CHOLHDL 3.2 05/19/2021  ? ?Lab Results  ?Component Value Date  ? VD25OH 30.0 01/01/2021  ? VD25OH 42.6 09/29/2020  ? VD25OH 22.9 (L) 01/10/2020  ? ?Lab Results  ?Component Value Date  ? WBC 7.4 05/12/2021  ? HGB 13.5 05/12/2021  ? HCT 39.5 05/12/2021  ? MCV 90 05/12/2021  ? PLT 328 05/12/2021  ? ?Lab Results  ?Component Value Date  ? IRON 83 05/19/2021  ? TIBC 230 (L) 05/19/2021  ? FERRITIN 478 (H) 05/19/2021  ? ?Attestation Statements:  ? ?Reviewed by clinician on day of visit: allergies, medications, problem list, medical history, surgical history, family history, social history, and previous encounter notes. ? ?I, Betty Cain, am acting as Location manager for [employer], PA-C. ? ?I have reviewed the above documentation for accuracy and completeness, and I agree with the above. Betty Potash, PA-C ? ?

## 2021-06-21 ENCOUNTER — Other Ambulatory Visit: Payer: Self-pay

## 2021-06-21 DIAGNOSIS — R1013 Epigastric pain: Secondary | ICD-10-CM

## 2021-06-21 DIAGNOSIS — K219 Gastro-esophageal reflux disease without esophagitis: Secondary | ICD-10-CM

## 2021-06-21 DIAGNOSIS — D509 Iron deficiency anemia, unspecified: Secondary | ICD-10-CM

## 2021-06-21 NOTE — Progress Notes (Signed)
Patient needs EGD at [facility]. Will schedule with Dr. Havery Moros on May 18th ?

## 2021-06-29 ENCOUNTER — Encounter: Payer: Self-pay | Admitting: Adult Health

## 2021-06-29 ENCOUNTER — Other Ambulatory Visit: Payer: Self-pay

## 2021-06-29 ENCOUNTER — Ambulatory Visit (INDEPENDENT_AMBULATORY_CARE_PROVIDER_SITE_OTHER): Payer: 59 | Admitting: Adult Health

## 2021-06-29 VITALS — BP 102/70 | HR 87 | Ht <= 58 in | Wt 128.0 lb

## 2021-06-29 DIAGNOSIS — G43019 Migraine without aura, intractable, without status migrainosus: Secondary | ICD-10-CM

## 2021-06-29 NOTE — Progress Notes (Signed)
? ? ?PATIENT: Betty Cain ?DOB: 1975-08-19 ? ?REASON FOR VISIT: follow up ?HISTORY FROM: patient ?PRIMARY NEUROLOGIST: Dr. Jannifer Franklin ? ?HISTORY OF PRESENT ILLNESS: ?Today 06/29/21: ? ?[name] is a 46 year old female with a history of migraine headaches.  She returns today for follow-up.  Continues Ajovy and reports that it works well. Takes nurtec when she gets a headache. Reports headache will resolve after rest in addition to nurtec. Not sure of her headache frequency but knows that it's better with medication.  ? ?Patient reports that she has generalized pain all over.  Reviewing her PCP note from February it looks as a diagnosis of fibromyalgia was being considered.  The patient reports that she was told to follow-up with neurology. ? ? ?[name] is a 46 year old female with a history of migraine headaches.  She is currently on Ajovy monthly injections.  She also takes Nurtec for abortive therapy.  At the last visit tizanidine was added on for abortive therapy.  Patient reports that her headaches have worsened.  She states that she has a daily headache that is typically mild.  She states 3 days out of the week it is severe.  Patient reports that she tried tizanidine but she does not remember how helpful it was.  She reports that Nurtec offers her temporary relief. ? ?HISTORY (copied from [place]): 06/19/20 ?[name] is a 46 year old female with history of daily headaches.  Has tried Aimovig, is now on Ajovy since January. Has DM, A1C was 5.7.  Currently taking gabapentin, metoprolol, nortriptyline.  Has an allergy to Topamax. Likes Ajovy, helping more than Aimovig, migraines are improved, only 1 a month, but is more intense. It may last 5 days. Is able to continue with function, has 4 kids. Takes Nurtec, is helpful. May have nausea, but no vomiting. Is happy with how things have improved. With headaches, can have aches all over, tightness in her neck. Here with interpreter.  ? ?REVIEW OF SYSTEMS: Out of  a complete 14 system review of symptoms, the patient complains only of the following symptoms, and all other reviewed systems are negative. ? ?See HPI ? ?ALLERGIES: ?Allergies  ?Allergen Reactions  ? Topamax [Topiramate] Itching and Rash  ? ? ?HOME MEDICATIONS: ?Outpatient Medications Prior to Visit  ?Medication Sig Dispense Refill  ? acetaminophen (TYLENOL) 500 MG tablet Take 500 mg by mouth every 6 (six) hours as needed.    ? albuterol (VENTOLIN HFA) 108 (90 Base) MCG/ACT inhaler Inhale 2 puffs into the lungs every 6 (six) hours as needed for wheezing or shortness of breath. 18 g 2  ? Blood Pressure Monitoring DEVI 1 each by Does not apply route daily. 1 Device 0  ? dicyclomine (BENTYL) 10 MG/5ML solution Take 5 mLs (10 mg total) by mouth 4 (four) times daily -  before meals and at bedtime. 600 mL 0  ? docusate sodium (COLACE) 100 MG capsule Take 1 capsule (100 mg total) by mouth daily. 30 capsule 11  ? ferrous sulfate (FEROSUL) 325 (65 FE) MG tablet TAKE 1 TABLET (325 MG TOTAL) BY MOUTH DAILY. 90 tablet 3  ? fluticasone (FLONASE) 50 MCG/ACT nasal spray PLACE 2 SPRAYS INTO BOTH NOSTRILS DAILY. 16 g 11  ? Fremanezumab-vfrm 225 MG/1.5ML SOAJ INJECT 225 MG INTO THE SKIN EVERY 30 (THIRTY) DAYS. 4.5 mL 3  ? gabapentin (NEURONTIN) 300 MG capsule TAKE 1 CAPSULE BY MOUTH IN THE MORNING AND 2 CAPSULES IN THE EVENING. 270 capsule 3  ? glucose blood (ACCU-CHEK GUIDE) test  strip Use as instructed 100 each 12  ? ivabradine (CORLANOR) 5 MG TABS tablet Take 3 tablets by mouth two hours prior to cardiac CT scan. 3 tablet 0  ? levocetirizine (XYZAL) 5 MG tablet TAKE 1 TABLET (5 MG TOTAL) BY MOUTH EVERY EVENING. 90 tablet 3  ? methylcellulose (CITRUCEL) oral powder Take as directed, daily    ? metoCLOPramide (REGLAN) 5 MG tablet Take 1 tablet (5 mg total) by mouth 3 (three) times daily before meals. 90 tablet 3  ? metoprolol succinate (TOPROL-XL) 100 MG 24 hr tablet Take 1 tablet (100 mg total) by mouth daily. 90 tablet 3  ?  metoprolol tartrate (LOPRESSOR) 50 MG tablet Take one tablet by mouth 2 hours prior to CT 1 tablet 0  ? Multiple Vitamin (MULTI VITAMIN DAILY PO) Take by mouth.    ? norgestimate-ethinyl estradiol (ORTHO-CYCLEN) 0.25-35 MG-MCG tablet TAKE 1 TABLET BY MOUTH DAILY. 30 tablet 11  ? nortriptyline (PAMELOR) 25 MG capsule Take 1 capsule (25 mg total) by mouth at bedtime. 90 capsule 3  ? ondansetron (ZOFRAN-ODT) 8 MG disintegrating tablet TAKE 1 TABLET BY MOUTH EVERY 8 HOURS AS NEEDED FOR NAUSEA OR VOMITING. 30 tablet 0  ? OneTouch Delica Lancets 77A MISC 1 application by Does not apply route in the morning, at noon, in the evening, and at bedtime. 100 each 11  ? pantoprazole (PROTONIX) 40 MG tablet Take 1 tablet (40 mg total) by mouth 2 (two) times daily before a meal. 60 tablet 3  ? Plecanatide (TRULANCE) 3 MG TABS Take 3 mg by mouth daily. 30 tablet 3  ? potassium chloride SA (KLOR-CON M) 20 MEQ tablet Take 1 tablet (20 mEq total) by mouth daily. 30 tablet 11  ? Rimegepant Sulfate 75 MG TBDP Take 1 tab at onset of migraine. May repeat in 2 hrs, if needed. Max dose: 2 tabs/day or 15/month. This is a 90 day rx. 45 tablet 3  ? rosuvastatin (CRESTOR) 5 MG tablet TAKE 1 TABLET (5 MG TOTAL) BY MOUTH AT BEDTIME. 60 tablet 11  ? Semaglutide, 1 MG/DOSE, 4 MG/3ML SOPN Inject 1 mg as directed once a week. 3 mL 0  ? sucralfate (CARAFATE) 1 g tablet Take 1 tablet (1 g total) by mouth every 6 (six) hours as needed. Slowly dissolve 1 tablet in 1 tablespoon of distilled water prior to ingestion. 60 tablet 2  ? tiZANidine (ZANAFLEX) 2 MG tablet Take 1 tablet (2 mg total) by mouth every 8 (eight) hours as needed for muscle spasms. Will need follow up for further refills.  12/29/2020 20 tablet 1  ? Vitamin D, Ergocalciferol, (DRISDOL) 1.25 MG (50000 UNIT) CAPS capsule TAKE 1 CAPSULE  BY MOUTH EVERY 7 DAYS. 4 capsule 0  ? ?No facility-administered medications prior to visit.  ? ? ?PAST MEDICAL HISTORY: ?Past Medical History:  ?Diagnosis  Date  ? Allergy   ? Anemia   ? Anxiety 01/2019  ? Asthma   ? B12 deficiency   ? Back pain   ? Common migraine with intractable migraine 06/28/2017  ? Constipation   ? Diabetes ([place]) 02/2019  ? Fatigue   ? GERD (gastroesophageal reflux disease)   ? pos H pylori  ? Hypertension   ? controlled with medication  ? IBS (irritable bowel syndrome) 2005  ? Joint pain   ? Neonatal death   ? Vaginal delivery, full term-lived x2 hours.   ? Palpitations   ? Shortness of breath   ? Shortness of breath on exertion   ?  Spinal headache   ? Swelling of both lower extremities   ? Valvular heart disease   ? Vitamin D deficiency 02/2019  ? ? ?PAST SURGICAL HISTORY: ?Past Surgical History:  ?Procedure Laterality Date  ? ADENOIDECTOMY    ? and tonsils (as a child)  ? boil  2003  ? right elbow  ? CESAREAN SECTION    ? x4  ? CESAREAN SECTION  06/02/2011  ? Procedure: CESAREAN SECTION;  Surgeon: [name], MD;  Location: [facility];  Service: Gynecology;  Laterality: N/A;  Primary Cesarean Section Delivery Baby Boy @ 0004, Apgars 9/9  ? CESAREAN SECTION N/A 12/10/2013  ? Procedure: REPEAT CESAREAN SECTION;  Surgeon: [name], MD;  Location: [facility];  Service: Obstetrics;  Laterality: N/A;  ? CESAREAN SECTION    ? colonoscopy  11/23/2018  ? stated had issues with sleep when in for procedure  ? OTHER SURGICAL HISTORY  2005  ? Uterine surgery   ? uterine cauterization    ? ? ?FAMILY HISTORY: ?Family History  ?Problem Relation Age of Onset  ? Diabetes Mother   ? Diabetes Father   ? Diabetes Sister   ? Anesthesia problems Neg Hx   ? Colon cancer Neg Hx   ? Esophageal cancer Neg Hx   ? Rectal cancer Neg Hx   ? Stomach cancer Neg Hx   ? Colon polyps Neg Hx   ? ? ?SOCIAL HISTORY: ?Social History  ? ?Socioeconomic History  ? Marital status: Married  ?  Spouse name: [name]  ? Number of children: 4  ? Years of education: Not on file  ? Highest education level: Not on file  ?Occupational History  ? Occupation: Unemployed  ?Tobacco Use  ?  Smoking status: Never  ? Smokeless tobacco: Never  ?Vaping Use  ? Vaping Use: Never used  ?Substance and Sexual Activity  ? Alcohol use: No  ? Drug use: No  ? Sexual activity: Yes  ?  Birth control/protection: None

## 2021-06-29 NOTE — Patient Instructions (Signed)
Your Plan: ? ?Continue ajovy and nurtec ?If your symptoms worsen or you develop new symptoms please let us know.  ? ? ?Thank you for coming to see [place] at [facility]. I hope we have been able to provide you high quality care today. ? ?You may receive a patient satisfaction survey over the next few weeks. We would appreciate your feedback and comments so that we may continue to improve ourselves and the health of our patients. ? ?

## 2021-06-30 NOTE — Telephone Encounter (Signed)
Tracey 

## 2021-07-01 ENCOUNTER — Ambulatory Visit: Payer: 59 | Admitting: Nurse Practitioner

## 2021-07-01 NOTE — Telephone Encounter (Signed)
Not changing her dose until I see her for a visit. Thanks

## 2021-07-10 ENCOUNTER — Other Ambulatory Visit: Payer: Self-pay

## 2021-07-10 MED FILL — Rosuvastatin Calcium Tab 5 MG: ORAL | 30 days supply | Qty: 30 | Fill #2 | Status: CN

## 2021-07-14 ENCOUNTER — Other Ambulatory Visit: Payer: Self-pay

## 2021-07-14 ENCOUNTER — Other Ambulatory Visit: Payer: Self-pay | Admitting: Nurse Practitioner

## 2021-07-15 ENCOUNTER — Encounter (INDEPENDENT_AMBULATORY_CARE_PROVIDER_SITE_OTHER): Payer: Self-pay | Admitting: Physician Assistant

## 2021-07-15 ENCOUNTER — Other Ambulatory Visit: Payer: Self-pay

## 2021-07-15 ENCOUNTER — Ambulatory Visit (INDEPENDENT_AMBULATORY_CARE_PROVIDER_SITE_OTHER): Payer: Medicaid Other | Admitting: Physician Assistant

## 2021-07-15 VITALS — BP 95/58 | HR 84 | Temp 98.4°F | Ht <= 58 in | Wt 123.0 lb

## 2021-07-15 DIAGNOSIS — Z6825 Body mass index (BMI) 25.0-25.9, adult: Secondary | ICD-10-CM

## 2021-07-15 DIAGNOSIS — Z7985 Long-term (current) use of injectable non-insulin antidiabetic drugs: Secondary | ICD-10-CM

## 2021-07-15 DIAGNOSIS — E1140 Type 2 diabetes mellitus with diabetic neuropathy, unspecified: Secondary | ICD-10-CM | POA: Diagnosis not present

## 2021-07-15 DIAGNOSIS — E559 Vitamin D deficiency, unspecified: Secondary | ICD-10-CM

## 2021-07-15 DIAGNOSIS — E669 Obesity, unspecified: Secondary | ICD-10-CM | POA: Diagnosis not present

## 2021-07-15 MED ORDER — SEMAGLUTIDE (1 MG/DOSE) 4 MG/3ML ~~LOC~~ SOPN
1.0000 mg | PEN_INJECTOR | SUBCUTANEOUS | 0 refills | Status: DC
Start: 2021-07-15 — End: 2021-09-02
  Filled 2021-07-15: qty 3, 28d supply, fill #0

## 2021-07-15 MED ORDER — VITAMIN D (ERGOCALCIFEROL) 1.25 MG (50000 UNIT) PO CAPS
ORAL_CAPSULE | ORAL | 0 refills | Status: DC
  Filled 2021-07-15: qty 4, 28d supply, fill #0

## 2021-07-16 ENCOUNTER — Other Ambulatory Visit: Payer: Self-pay

## 2021-07-16 NOTE — Progress Notes (Signed)
? ? ? ?Chief Complaint:  ? ?OBESITY ?Betty Cain is here to discuss her progress with her obesity treatment plan along with follow-up of her obesity related diagnoses. Betty Cain is on the Category 2 Plan and states she is following her eating plan approximately 70% of the time. Betty Cain states she is dancing and doing stomach exercise for 5 minutes 1-2 times per week. ? ?Today's visit was #: 81 ?Starting weight: 152 lbs ?Starting date: 01/10/2020 ?Today's weight: 123 lbs ?Today's date: 07/15/2021 ?Total lbs lost to date: 29 lbs ?Total lbs lost since last in-office visit: 1 lb ? ?Interim History: Betty Cain is fasting during the day due to Ramadan, despite advising that it is not safe because of the dangers of hypoglycemia with her diagnosis of diabetes. She is eating between 8 pm and 10:30 pm and then going to sleep. She also has tea with milk and banana at 5 am prior to fasting all day. ? ?Subjective:  ? ?1. Type 2 diabetes mellitus with diabetic neuropathy, without long-term current use of insulin ([place]) ?Betty Cain is currently on Ozempic 1 mg. She is fasting during the day due to Ramadan. Her last A1C was 5.7. ? ?2. Vitamin D deficiency ?Betty Cain is on Vitamin D currently. No nausea or vomiting associated with taking vitamin d.  ? ?Assessment/Plan:  ? ?1. Type 2 diabetes mellitus with diabetic neuropathy, without long-term current use of insulin ([place]) ?We will refill Ozempic 1 mg for 1 month with no refills. Good blood sugar control is important to decrease the likelihood of diabetic complications such as nephropathy, neuropathy, limb loss, blindness, coronary artery disease, and death. Intensive lifestyle modification including diet, exercise and weight loss are the first line of treatment for diabetes.  ? ?- Semaglutide, 1 MG/DOSE, 4 MG/3ML SOPN; Inject 1 mg as directed once a week.  Dispense: 3 mL; Refill: 0 ? ?2. Vitamin D deficiency ?Low Vitamin D level contributes to fatigue and are associated with obesity, breast, and colon cancer. We will  refill  prescription Vitamin D 50,000 IU every week for 1 month with no refills and Betty Cain will follow-up for routine testing of Vitamin D, at least 2-3 times per year to avoid over-replacement. ? ?- Vitamin D, Ergocalciferol, (DRISDOL) 1.25 MG (50000 UNIT) CAPS capsule; TAKE 1 CAPSULE  BY MOUTH EVERY 7 DAYS.  Dispense: 4 capsule; Refill: 0 ? ?3. Obesity: Current BMI 25.92 ?Betty Cain is currently in the action stage of change. As such, her goal is to continue with weight loss efforts. She has agreed to the Category 2 Plan.  ? ?Exercise goals:  As is. ? ?Behavioral modification strategies: meal planning and cooking strategies and keeping healthy foods in the home. ? ?Betty Cain has agreed to follow-up with our clinic in 4 weeks. She was informed of the importance of frequent follow-up visits to maximize her success with intensive lifestyle modifications for her multiple health conditions.  ? ?Objective:  ? ?Blood pressure (!) 95/58, pulse 84, temperature 98.4 ?F (36.9 ?C), height '4\' 10"'$  (1.473 m), weight 123 lb (55.8 kg), SpO2 99 %. ?Body mass index is 25.71 kg/m?. ? ?General: Cooperative, alert, well developed, in no acute distress. ?HEENT: Conjunctivae and lids unremarkable. ?Cardiovascular: Regular rhythm.  ?Lungs: Normal work of breathing. ?Neurologic: No focal deficits.  ? ?Lab Results  ?Component Value Date  ? CREATININE 0.71 05/19/2021  ? BUN 12 05/19/2021  ? NA 142 05/19/2021  ? K 4.2 05/19/2021  ? CL 104 05/19/2021  ? CO2 24 04/28/2021  ? ?Lab Results  ?  Component Value Date  ? ALT 11 12/22/2020  ? AST 13 05/19/2021  ? ALKPHOS 80 05/19/2021  ? BILITOT 0.5 05/19/2021  ? ?Lab Results  ?Component Value Date  ? HGBA1C 5.7 (A) 01/01/2021  ? HGBA1C 6.7 (H) 09/29/2020  ? HGBA1C 5.7 (A) 06/13/2020  ? HGBA1C 7.5 (A) 01/02/2020  ? HGBA1C 7.5 01/02/2020  ? ?Lab Results  ?Component Value Date  ? INSULIN 8.7 01/10/2020  ? ?Lab Results  ?Component Value Date  ? TSH 1.430 12/22/2020  ? ?Lab Results  ?Component Value Date  ? CHOL 120  05/19/2021  ? HDL 37 (L) 05/19/2021  ? [place] 66 05/19/2021  ? TRIG 84 05/19/2021  ? CHOLHDL 3.2 05/19/2021  ? ?Lab Results  ?Component Value Date  ? VD25OH 30.0 01/01/2021  ? VD25OH 42.6 09/29/2020  ? VD25OH 22.9 (L) 01/10/2020  ? ?Lab Results  ?Component Value Date  ? WBC 7.4 05/12/2021  ? HGB 13.5 05/12/2021  ? HCT 39.5 05/12/2021  ? MCV 90 05/12/2021  ? PLT 328 05/12/2021  ? ?Lab Results  ?Component Value Date  ? IRON 83 05/19/2021  ? TIBC 230 (L) 05/19/2021  ? FERRITIN 478 (H) 05/19/2021  ? ?Attestation Statements:  ? ?Reviewed by clinician on day of visit: allergies, medications, problem list, medical history, surgical history, family history, social history, and previous encounter notes. ? ?I, Tonye Pearson, am acting as Location manager for [employer], PA-C. ? ?I have reviewed the above documentation for accuracy and completeness, and I agree with the above. Abby Potash, PA-C ? ?

## 2021-07-21 ENCOUNTER — Other Ambulatory Visit: Payer: Self-pay

## 2021-07-22 ENCOUNTER — Other Ambulatory Visit: Payer: Self-pay

## 2021-07-24 ENCOUNTER — Other Ambulatory Visit: Payer: Self-pay

## 2021-07-24 ENCOUNTER — Encounter: Payer: Self-pay | Admitting: Adult Health

## 2021-07-28 ENCOUNTER — Other Ambulatory Visit: Payer: Self-pay

## 2021-07-29 NOTE — Telephone Encounter (Signed)
I am still getting the rejection message on Cover My Meds stating plan has been terminated. I called [employer]. They were able to confirm patient has active coverage at this time. Will attempt to do PAs. May need to do them over the phone.  ?

## 2021-07-30 ENCOUNTER — Other Ambulatory Visit: Payer: Self-pay

## 2021-07-30 ENCOUNTER — Other Ambulatory Visit: Payer: Self-pay | Admitting: Nurse Practitioner

## 2021-07-30 MED ORDER — ROSUVASTATIN CALCIUM 5 MG PO TABS
ORAL_TABLET | Freq: Every day | ORAL | 0 refills | Status: DC
  Filled 2021-07-30: qty 30, 30d supply, fill #0

## 2021-07-30 NOTE — Telephone Encounter (Signed)
Completed [place] PA on Cover My Meds with [facility] Plan. Key: [id number]. PA Case: [id number], Status: Approved, Coverage Starts on: 07/30/2021 12:00:00 AM, Coverage Ends on: 07/30/2022 12:00:00 AM.  ?

## 2021-07-30 NOTE — Telephone Encounter (Signed)
Attempted [employer] PA as well. Received this message: Available Without Authorization.  ?

## 2021-07-31 ENCOUNTER — Other Ambulatory Visit: Payer: Self-pay

## 2021-08-18 ENCOUNTER — Ambulatory Visit (INDEPENDENT_AMBULATORY_CARE_PROVIDER_SITE_OTHER): Payer: Medicaid Other | Admitting: Physician Assistant

## 2021-08-19 ENCOUNTER — Ambulatory Visit: Payer: Medicaid Other | Admitting: Nurse Practitioner

## 2021-08-19 ENCOUNTER — Encounter: Payer: Self-pay | Admitting: Nurse Practitioner

## 2021-08-19 VITALS — BP 104/80 | HR 89 | Temp 98.3°F | Ht <= 58 in | Wt 125.4 lb

## 2021-08-19 DIAGNOSIS — B070 Plantar wart: Secondary | ICD-10-CM | POA: Diagnosis not present

## 2021-08-19 DIAGNOSIS — R002 Palpitations: Secondary | ICD-10-CM

## 2021-08-19 DIAGNOSIS — G8929 Other chronic pain: Secondary | ICD-10-CM

## 2021-08-19 DIAGNOSIS — E1165 Type 2 diabetes mellitus with hyperglycemia: Secondary | ICD-10-CM | POA: Diagnosis not present

## 2021-08-19 DIAGNOSIS — J302 Other seasonal allergic rhinitis: Secondary | ICD-10-CM | POA: Diagnosis not present

## 2021-08-19 DIAGNOSIS — R0602 Shortness of breath: Secondary | ICD-10-CM

## 2021-08-19 LAB — POCT GLYCOSYLATED HEMOGLOBIN (HGB A1C)
HbA1c POC (<> result, manual entry): 5 % (ref 4.0–5.6)
HbA1c, POC (controlled diabetic range): 5 % (ref 0.0–7.0)
HbA1c, POC (prediabetic range): 5 % — AB (ref 5.7–6.4)
Hemoglobin A1C: 5 % (ref 4.0–5.6)

## 2021-08-19 MED ORDER — CETIRIZINE HCL 10 MG PO TABS: 10.0000 mg | ORAL_TABLET | Freq: Every day | ORAL | 11 refills | Status: DC

## 2021-08-19 NOTE — Assessment & Plan Note (Signed)
-   CBC ?- Comprehensive metabolic panel ?- HgB Z6S ? ?2. Heart palpitations ? ?- Ambulatory referral to Cardiac Electrophysiology ? ?3. Shortness of breath ? ?- Ambulatory referral to Cardiac Electrophysiology ? ?4. Other chronic pain ? ?- Ambulatory referral to Rheumatology ? ?5. Seasonal allergies ? ?- cetirizine (ZYRTEC) 10 MG tablet; Take 1 tablet (10 mg total) by mouth daily.  Dispense: 30 tablet; Refill: 11 ? ?6. Plantar wart of left foot ? ?- Ambulatory referral to Podiatry ? ?Follow up: ? ?Follow up in 3 months ? ?

## 2021-08-19 NOTE — Patient Instructions (Addendum)
1. Type 2 diabetes mellitus with hyperglycemia, without long-term current use of insulin ([place]) ? ?- CBC ?- Comprehensive metabolic panel ?- HgB J9L ? ?2. Heart palpitations ? ?- Ambulatory referral to Cardiac Electrophysiology ? ?3. Shortness of breath ? ?- Ambulatory referral to Cardiac Electrophysiology ? ?4. Other chronic pain ? ?- Ambulatory referral to Rheumatology ? ?5. Seasonal allergies ? ?- cetirizine (ZYRTEC) 10 MG tablet; Take 1 tablet (10 mg total) by mouth daily.  Dispense: 30 tablet; Refill: 11 ? ?6. Plantar wart of left foot ? ?- Ambulatory referral to Podiatry ? ?Follow up: ? ?Follow up in 3 months ? ?

## 2021-08-19 NOTE — Progress Notes (Signed)
$'@Patient't$  ID: Betty Cain, female    DOB: 1975-11-08, 46 y.o.   MRN: [id number] ? ?Chief Complaint  ?Patient presents with  ? Follow-up  ?  We are using the language services line for the patient today. ?Patient is here today for her 3 month follow up and to discuss the heart palpitations she is still having since her last visit 3 months ago. Patient went to the ED for her allergies but never mentioned the heart palpitations when she was there and all her test came back normal. Patient is also having abdominal pains as well and will be having an endoscopy done on May 18th.  ? ? ?Referring provider: ?Vevelyn Francois, NP ? ? ?HPI ? ? ?Betty Cain presents to discuss labs. She  has a past medical history of Allergy, Anemia, Anxiety (01/2019), Asthma, B12 deficiency, Back pain, Common migraine with intractable migraine (06/28/2017), Constipation, Diabetes ([place]) (02/2019), Fatigue, GERD (gastroesophageal reflux disease), Hypertension, IBS (irritable bowel syndrome) 07/21/03), Joint pain, Neonatal death, Palpitations, Shortness of breath, Shortness of breath on exertion, Spinal headache, Swelling of both lower extremities, Valvular heart disease, and Vitamin D deficiency (02/2019).  ? ? ?Diabetes: ? ?Patient presents today for follow-up visit.  She states that she is seeing a specialist for her diabetes.  They do currently have her on Ozempic.  She does see a nutritionist. ? ? ?Heart palpitations: ? ?Patient complains today of ongoing heart palpitations and heart racing.  She states that she has seen cardiology for this issue.  She complains of heart racing and palpitations upon getting out of the bed in the mornings and periodically throughout the day.  Patient states that she does have associated fatigue and shortness of breath.  We discussed that we will send her for evaluation through EP for POTS. ? ? ?Chronic pain: ? ?Patient also complains of ongoing chronic pain.  She has seen Dionisio David for this in the past and was  told that she would be referred to rheumatology for further evaluation of possible fibromyalgia.  Patient states that she the referral never went through.  We discussed that we will try to replace this referral today. ? ? ? ?Seasonal Allergies: ? ?Patient complains today of seasonal allergies.  She states that she has been having bilateral ear pain.  We discussed that ears are clear on exam today.  We will prescribe Zyrtec daily. ? ?Plantar wart of left foot: ? ?Patient complains today of plantar wart on left foot.  She would like referral to podiatry for treatment.  We will place referral today. ? ?Denies f/c/s, n/v/d, hemoptysis, PND, chest pain or edema. ? ? ? ? ?Allergies  ?Allergen Reactions  ? Topamax [Topiramate] Itching and Rash  ? ? ?Immunization History  ?Administered Date(s) Administered  ? Influenza,inj,Quad PF,6+ Mos 06/19/2013, 12/02/2015  ? PFIZER(Purple Top)SARS-COV-2 Vaccination 07/07/2019, 07/28/2019  ? Pneumococcal Conjugate-13 05/21/2019  ? Tdap 09/13/2013, 12/11/2013  ? ? ?Past Medical History:  ?Diagnosis Date  ? Allergy   ? Anemia   ? Anxiety 01/2019  ? Asthma   ? B12 deficiency   ? Back pain   ? Common migraine with intractable migraine 06/28/2017  ? Constipation   ? Diabetes ([place]) 02/2019  ? Fatigue   ? GERD (gastroesophageal reflux disease)   ? pos H pylori  ? Hypertension   ? controlled with medication  ? IBS (irritable bowel syndrome) 07/21/03  ? Joint pain   ? Neonatal death   ? Vaginal delivery, full term-lived x2 hours.   ?  Palpitations   ? Shortness of breath   ? Shortness of breath on exertion   ? Spinal headache   ? Swelling of both lower extremities   ? Valvular heart disease   ? Vitamin D deficiency 02/2019  ? ? ?Tobacco History: ?Social History  ? ?Tobacco Use  ?Smoking Status Never  ?Smokeless Tobacco Never  ? ?Counseling given: Not Answered ? ? ?Outpatient Encounter Medications as of 08/19/2021  ?Medication Sig  ? acetaminophen (TYLENOL) 500 MG tablet Take 500 mg by mouth every 6  (six) hours as needed.  ? albuterol (VENTOLIN HFA) 108 (90 Base) MCG/ACT inhaler Inhale 2 puffs into the lungs every 6 (six) hours as needed for wheezing or shortness of breath.  ? Blood Pressure Monitoring DEVI 1 each by Does not apply route daily.  ? cetirizine (ZYRTEC) 10 MG tablet Take 1 tablet (10 mg total) by mouth daily.  ? docusate sodium (COLACE) 100 MG capsule Take 1 capsule (100 mg total) by mouth daily.  ? fluticasone (FLONASE) 50 MCG/ACT nasal spray PLACE 2 SPRAYS INTO BOTH NOSTRILS DAILY.  ? Fremanezumab-vfrm 225 MG/1.5ML SOAJ INJECT 225 MG INTO THE SKIN EVERY 30 (THIRTY) DAYS.  ? glucose blood (ACCU-CHEK GUIDE) test strip Use as instructed  ? levocetirizine (XYZAL) 5 MG tablet TAKE 1 TABLET (5 MG TOTAL) BY MOUTH EVERY EVENING.  ? methylcellulose (CITRUCEL) oral powder Take as directed, daily  ? metoCLOPramide (REGLAN) 5 MG tablet Take 1 tablet (5 mg total) by mouth 3 (three) times daily before meals.  ? metoprolol succinate (TOPROL-XL) 100 MG 24 hr tablet Take 1 tablet (100 mg total) by mouth daily.  ? metoprolol tartrate (LOPRESSOR) 50 MG tablet Take one tablet by mouth 2 hours prior to CT  ? Multiple Vitamin (MULTI VITAMIN DAILY PO) Take by mouth.  ? norgestimate-ethinyl estradiol (ORTHO-CYCLEN) 0.25-35 MG-MCG tablet TAKE 1 TABLET BY MOUTH DAILY.  ? ondansetron (ZOFRAN-ODT) 8 MG disintegrating tablet TAKE 1 TABLET BY MOUTH EVERY 8 HOURS AS NEEDED FOR NAUSEA OR VOMITING.  ? OneTouch Delica Lancets 70Y MISC 1 application by Does not apply route in the morning, at noon, in the evening, and at bedtime.  ? pantoprazole (PROTONIX) 40 MG tablet Take 1 tablet (40 mg total) by mouth 2 (two) times daily before a meal.  ? potassium chloride SA (KLOR-CON M) 20 MEQ tablet Take 1 tablet (20 mEq total) by mouth daily.  ? Rimegepant Sulfate 75 MG TBDP Take 1 tab at onset of migraine. May repeat in 2 hrs, if needed. Max dose: 2 tabs/day or 15/month. This is a 90 day rx.  ? rosuvastatin (CRESTOR) 5 MG tablet TAKE 1  TABLET (5 MG TOTAL) BY MOUTH AT BEDTIME.  ? Semaglutide, 1 MG/DOSE, 4 MG/3ML SOPN Inject 1 mg as directed once a week.  ? sucralfate (CARAFATE) 1 g tablet Take 1 tablet (1 g total) by mouth every 6 (six) hours as needed. Slowly dissolve 1 tablet in 1 tablespoon of distilled water prior to ingestion.  ? Vitamin D, Ergocalciferol, (DRISDOL) 1.25 MG (50000 UNIT) CAPS capsule TAKE 1 CAPSULE  BY MOUTH EVERY 7 DAYS.  ? dicyclomine (BENTYL) 10 MG/5ML solution Take 5 mLs (10 mg total) by mouth 4 (four) times daily -  before meals and at bedtime.  ? ferrous sulfate (FEROSUL) 325 (65 FE) MG tablet TAKE 1 TABLET (325 MG TOTAL) BY MOUTH DAILY. (Patient not taking: Reported on 08/19/2021)  ? gabapentin (NEURONTIN) 300 MG capsule TAKE 1 CAPSULE BY MOUTH IN THE MORNING AND  2 CAPSULES IN THE EVENING.  ? ivabradine (CORLANOR) 5 MG TABS tablet Take 3 tablets by mouth two hours prior to cardiac CT scan. (Patient not taking: Reported on 08/19/2021)  ? nortriptyline (PAMELOR) 25 MG capsule Take 1 capsule (25 mg total) by mouth at bedtime.  ? Plecanatide (TRULANCE) 3 MG TABS Take 3 mg by mouth daily. (Patient not taking: Reported on 08/19/2021)  ? tiZANidine (ZANAFLEX) 2 MG tablet Take 1 tablet (2 mg total) by mouth every 8 (eight) hours as needed for muscle spasms. Will need follow up for further refills.  12/29/2020 (Patient not taking: Reported on 08/19/2021)  ? [DISCONTINUED] glipiZIDE (GLUCOTROL) 10 MG tablet Take 1 tablet (10 mg total) by mouth 2 (two) times daily before a meal.  ? ?No facility-administered encounter medications on file as of 08/19/2021.  ? ? ? ?Review of Systems ? ?Review of Systems  ?Constitutional: Negative.   ?HENT:  Positive for ear pain.   ?Cardiovascular: Negative.   ?Gastrointestinal: Negative.   ?Musculoskeletal:  Positive for arthralgias and myalgias.  ?Allergic/Immunologic: Negative.   ?Neurological: Negative.   ?Psychiatric/Behavioral: Negative.     ? ? ? ?Physical Exam ? ?BP 104/80   Pulse 89   Temp 98.3  ?F (36.8 ?C)   Ht '4\' 10"'$  (1.473 m)   Wt 125 lb 6.4 oz (56.9 kg)   SpO2 100%   BMI 26.21 kg/m?  ? ?Wt Readings from Last 5 Encounters:  ?08/19/21 125 lb 6.4 oz (56.9 kg)  ?07/15/21 123 lb (55.8 kg)  ?06/29/21 1

## 2021-08-20 ENCOUNTER — Encounter (HOSPITAL_COMMUNITY): Payer: Self-pay | Admitting: Gastroenterology

## 2021-08-20 LAB — COMPREHENSIVE METABOLIC PANEL
ALT: 15 IU/L (ref 0–32)
AST: 13 IU/L (ref 0–40)
Albumin/Globulin Ratio: 1.6 (ref 1.2–2.2)
Albumin: 4.5 g/dL (ref 3.8–4.8)
Alkaline Phosphatase: 68 IU/L (ref 44–121)
BUN/Creatinine Ratio: 21 (ref 9–23)
BUN: 13 mg/dL (ref 6–24)
Bilirubin Total: 0.3 mg/dL (ref 0.0–1.2)
CO2: 24 mmol/L (ref 20–29)
Calcium: 9.7 mg/dL (ref 8.7–10.2)
Chloride: 105 mmol/L (ref 96–106)
Creatinine, Ser: 0.63 mg/dL (ref 0.57–1.00)
Globulin, Total: 2.9 g/dL (ref 1.5–4.5)
Glucose: 92 mg/dL (ref 70–99)
Potassium: 4.6 mmol/L (ref 3.5–5.2)
Sodium: 142 mmol/L (ref 134–144)
Total Protein: 7.4 g/dL (ref 6.0–8.5)
eGFR: 113 mL/min/{1.73_m2} (ref >59)

## 2021-08-20 LAB — CBC
Hematocrit: 38.7 % (ref 34.0–46.6)
Hemoglobin: 13.1 g/dL (ref 11.1–15.9)
MCH: 30.4 pg (ref 26.6–33.0)
MCHC: 33.9 g/dL (ref 31.5–35.7)
MCV: 90 fL (ref 79–97)
Platelets: 358 10*3/uL (ref 150–450)
RBC: 4.31 x10E6/uL (ref 3.77–5.28)
RDW: 12.1 % (ref 11.7–15.4)
WBC: 6.5 10*3/uL (ref 3.4–10.8)

## 2021-08-26 NOTE — Anesthesia Preprocedure Evaluation (Addendum)
Anesthesia Evaluation  Patient identified by MRN, date of birth, ID band Patient awake    Reviewed: Allergy & Precautions, NPO status , Patient's Chart, lab work & pertinent test results  Airway Mallampati: II  TM Distance: >3 FB Neck ROM: Full    Dental no notable dental hx. (+) Teeth Intact, Dental Advisory Given   Pulmonary asthma ,    Pulmonary exam normal breath sounds clear to auscultation       Cardiovascular hypertension, Normal cardiovascular exam+ dysrhythmias  Rhythm:Regular Rate:Normal     Neuro/Psych  Headaches, Anxiety    GI/Hepatic Neg liver ROS, GERD  ,  Endo/Other  diabetes  Renal/GU      Musculoskeletal   Abdominal   Peds  Hematology Lab Results      Component                Value               Date                            HGB                      13.1                08/19/2021                HCT                      38.7                08/19/2021                MCV                      90                  08/19/2021                   Anesthesia Other Findings All: Topamax  Reproductive/Obstetrics                            Anesthesia Physical Anesthesia Plan  ASA: 2  Anesthesia Plan: MAC   Post-op Pain Management:    Induction:   PONV Risk Score and Plan: Treatment may vary due to age or medical condition  Airway Management Planned: Natural Airway and Nasal Cannula  Additional Equipment: None  Intra-op Plan:   Post-operative Plan:   Informed Consent: I have reviewed the patients History and Physical, chart, labs and discussed the procedure including the risks, benefits and alternatives for the proposed anesthesia with the patient or authorized representative who has indicated his/her understanding and acceptance.     Dental advisory given and Interpreter used for interveiw  Plan Discussed with:   Anesthesia Plan Comments: (Dyspepsia, Gerd  Fe  deficient anemia for EGD pt w Fish farm manager)       Anesthesia Quick Evaluation

## 2021-08-27 ENCOUNTER — Ambulatory Visit (INDEPENDENT_AMBULATORY_CARE_PROVIDER_SITE_OTHER): Payer: Medicaid Other | Admitting: Nurse Practitioner

## 2021-08-27 ENCOUNTER — Other Ambulatory Visit: Payer: Self-pay

## 2021-08-27 ENCOUNTER — Encounter (HOSPITAL_COMMUNITY)
Admission: RE | Disposition: A | Discharge status: Home / Self Care | Payer: Self-pay | Source: Home / Self Care | Attending: Gastroenterology

## 2021-08-27 ENCOUNTER — Encounter (HOSPITAL_COMMUNITY): Payer: Self-pay | Admitting: Gastroenterology

## 2021-08-27 ENCOUNTER — Ambulatory Visit (HOSPITAL_BASED_OUTPATIENT_CLINIC_OR_DEPARTMENT_OTHER): Payer: Medicaid Other | Admitting: Anesthesiology

## 2021-08-27 ENCOUNTER — Ambulatory Visit (HOSPITAL_COMMUNITY): Payer: Medicaid Other | Admitting: Anesthesiology

## 2021-08-27 ENCOUNTER — Ambulatory Visit (HOSPITAL_COMMUNITY)
Admission: RE | Admit: 2021-08-27 | Discharge: 2021-08-27 | Disposition: A | Discharge status: Home / Self Care | Payer: Medicaid Other | Attending: Gastroenterology | Admitting: Gastroenterology

## 2021-08-27 DIAGNOSIS — K2950 Unspecified chronic gastritis without bleeding: Secondary | ICD-10-CM | POA: Insufficient documentation

## 2021-08-27 DIAGNOSIS — R6881 Early satiety: Secondary | ICD-10-CM | POA: Insufficient documentation

## 2021-08-27 DIAGNOSIS — Z79899 Other long term (current) drug therapy: Secondary | ICD-10-CM | POA: Diagnosis not present

## 2021-08-27 DIAGNOSIS — Z7984 Long term (current) use of oral hypoglycemic drugs: Secondary | ICD-10-CM | POA: Diagnosis not present

## 2021-08-27 DIAGNOSIS — R110 Nausea: Secondary | ICD-10-CM | POA: Insufficient documentation

## 2021-08-27 DIAGNOSIS — K449 Diaphragmatic hernia without obstruction or gangrene: Secondary | ICD-10-CM | POA: Insufficient documentation

## 2021-08-27 DIAGNOSIS — F419 Anxiety disorder, unspecified: Secondary | ICD-10-CM | POA: Insufficient documentation

## 2021-08-27 DIAGNOSIS — I10 Essential (primary) hypertension: Secondary | ICD-10-CM

## 2021-08-27 DIAGNOSIS — R1013 Epigastric pain: Secondary | ICD-10-CM | POA: Diagnosis not present

## 2021-08-27 DIAGNOSIS — E119 Type 2 diabetes mellitus without complications: Secondary | ICD-10-CM

## 2021-08-27 DIAGNOSIS — D509 Iron deficiency anemia, unspecified: Secondary | ICD-10-CM | POA: Diagnosis not present

## 2021-08-27 DIAGNOSIS — J45909 Unspecified asthma, uncomplicated: Secondary | ICD-10-CM | POA: Diagnosis not present

## 2021-08-27 DIAGNOSIS — K219 Gastro-esophageal reflux disease without esophagitis: Secondary | ICD-10-CM | POA: Diagnosis not present

## 2021-08-27 HISTORY — PX: ESOPHAGOGASTRODUODENOSCOPY (EGD) WITH PROPOFOL: SHX5813

## 2021-08-27 HISTORY — PX: BIOPSY: SHX5522

## 2021-08-27 LAB — GLUCOSE, CAPILLARY: Glucose-Capillary: 78 mg/dL (ref 70–99)

## 2021-08-27 SURGERY — ESOPHAGOGASTRODUODENOSCOPY (EGD) WITH PROPOFOL
Anesthesia: Monitor Anesthesia Care

## 2021-08-27 MED ORDER — PROPOFOL 10 MG/ML IV BOLUS
INTRAVENOUS | Status: DC | PRN
  Administered 2021-08-27 (×2): 10 mg via INTRAVENOUS

## 2021-08-27 MED ORDER — LACTATED RINGERS IV SOLN: INTRAVENOUS | Status: DC | PRN

## 2021-08-27 MED ORDER — PROPOFOL 500 MG/50ML IV EMUL
INTRAVENOUS | Status: DC | PRN
  Administered 2021-08-27: 110 ug/kg/min via INTRAVENOUS

## 2021-08-27 MED ORDER — PROPOFOL 500 MG/50ML IV EMUL
INTRAVENOUS | Status: AC
Start: 2021-08-27 — End: ?
  Filled 2021-08-27: qty 50

## 2021-08-27 MED ORDER — LIDOCAINE HCL 1 % IJ SOLN
INTRAMUSCULAR | Status: DC | PRN
Start: 2021-08-27 — End: 2021-08-27
  Administered 2021-08-27: 50 mg via INTRADERMAL

## 2021-08-27 SURGICAL SUPPLY — 15 items

## 2021-08-27 NOTE — Anesthesia Postprocedure Evaluation (Signed)
Anesthesia Post Note  Patient: Betty Cain  Procedure(s) Performed: ESOPHAGOGASTRODUODENOSCOPY (EGD) WITH PROPOFOL BIOPSY     Patient location during evaluation: PACU Anesthesia Type: MAC Level of consciousness: awake and alert Pain management: pain level controlled Vital Signs Assessment: post-procedure vital signs reviewed and stable Respiratory status: spontaneous breathing, nonlabored ventilation, respiratory function stable and patient connected to nasal cannula oxygen Cardiovascular status: stable and blood pressure returned to baseline Postop Assessment: no apparent nausea or vomiting Anesthetic complications: no   No notable events documented.  Last Vitals:  Vitals:   08/27/21 0841 08/27/21 0851  BP: 114/78 107/65  Pulse: 95 92  Resp: 18 15  Temp: 36.8 C   SpO2: 100% 99%    Last Pain:  Vitals:   08/27/21 0841  TempSrc: Temporal  PainSc: 0-No pain                 Barnet Glasgow

## 2021-08-27 NOTE — Op Note (Signed)
[facility] Patient Name: Betty Cain Procedure Date: 08/27/2021 MRN: [id number] Attending MD: Carlota Raspberry. Havery Moros , MD Date of Birth: 1975/05/19 CSN: [id number] Age: 46 Admit Type: Outpatient Procedure:                Upper GI endoscopy Indications:              history of Iron deficiency, history of H pylori                            with treatment / eradication testing negative,                            symptoms of dyspepsia, Early satiety, Nausea -                            improved with pantoprazole 65m BID and Reglan PRN.                            On Ozempic for past year although symptoms pre-date                            its use Providers:                SRemo LippsP. AHavery Moros MD, JAllayne Gitelman RN, GWilliam Dalton Technician Referring MD:              Medicines:                Monitored Anesthesia Care Complications:            No immediate complications. Estimated blood loss:                            Minimal. Estimated Blood Loss:     Estimated blood loss was minimal. Procedure:                Pre-Anesthesia Assessment:                           - Prior to the procedure, a History and Physical                            was performed, and patient medications and                            allergies were reviewed. The patient's tolerance of                            previous anesthesia was also reviewed. The risks                            and benefits of the procedure and the sedation                            options  and risks were discussed with the patient.                            All questions were answered, and informed consent                            was obtained. Prior Anticoagulants: The patient has                            taken no previous anticoagulant or antiplatelet                            agents. ASA Grade Assessment: II - A patient with                            mild systemic disease. After  reviewing the risks                            and benefits, the patient was deemed in                            satisfactory condition to undergo the procedure.                           After obtaining informed consent, the endoscope was                            passed under direct vision. Throughout the                            procedure, the patient's blood pressure, pulse, and                            oxygen saturations were monitored continuously. The                            [id number]-[id number] ([id number]) Olympus endoscope was introduced                            through the mouth, and advanced to the second part                            of duodenum. The upper GI endoscopy was                            accomplished without difficulty. The patient                            tolerated the procedure well. Scope In: Scope Out: Findings:      Esophagogastric landmarks were identified: the Z-line was found at 32       cm, the gastroesophageal junction was found at 32 cm and the upper       extent of the gastric folds was found at 33 cm from  the incisors.      A 1 cm hiatal hernia was present.      The exam of the esophagus was otherwise normal.      The entire examined stomach was normal. Biopsies were taken with a cold       forceps for Helicobacter pylori testing.      The duodenal bulb and second portion of the duodenum were normal.       Biopsies for histology were taken with a cold forceps for evaluation of       celiac disease. Impression:               - Esophagogastric landmarks identified.                           - 1 cm hiatal hernia.                           - Normal esophagus otherwise                           - Normal stomach. Biopsied to rule out H pylori.                           - Normal duodenal bulb and second portion of the                            duodenum. Biopsied to rule out celiac disease.                           No evidence of gastric outlet  obstruction or other                            concerning pathology in light of history of IDA and                            H pylori history. Biopsies taken to ensure H pylori                            eradication. Suspect functional disorder, dyspepsia                            and or gastroparesis, Ozempic could be contributing                            to current symptoms. Doing better on protonix twice                            daily and Reglan PRN Moderate Sedation:      No moderate sedation, case performed with MAC Recommendation:           - Patient has a contact number available for                            emergencies. The signs and symptoms of potential  delayed complications were discussed with the                            patient. Return to normal activities tomorrow.                            Written discharge instructions were provided to the                            patient.                           - Resume previous diet.                           - Continue present medications.                           - Talk with primary care about changing Ozempic to                            another regimen if symptoms persist                           - Await pathology results. Procedure Code(s):        --- Professional ---                           [phone], Esophagogastroduodenoscopy, flexible,                            transoral; with biopsy, single or multiple Diagnosis Code(s):        --- Professional ---                           K44.9, Diaphragmatic hernia without obstruction or                            gangrene                           D50.9, Iron deficiency anemia, unspecified                           R10.13, Epigastric pain                           R68.81, Early satiety                           R11.0, Nausea CPT copyright 2019 [employer] Medical Association. All rights reserved. The codes documented in this report are  preliminary and upon coder review may  be revised to meet current compliance requirements. Remo Lipps P. Maedell Hedger, MD 08/27/2021 8:35:39 AM This report has been signed electronically. Number of Addenda: 0

## 2021-08-27 NOTE — H&P (Signed)
[facility] History and Physical   Primary Care Physician:  Fenton Foy, NP   Reason for Procedure:   Dyspepsia, nausea, early satiety, iron deficiency  Plan:    EGD     HPI: Betty Cain is a 46 y.o. female  here for EGD to evaluate ongoing dyspepsia, nausea, early satiety, history of iron deficiency. She has had a history of H pylori and treatment for that. Prior EGD was aborted at our office when the patient developed laryngospasm, stomach was not evaluated. Her at the hospital for EGD to evaluate these symptoms and clear upper tract in light of history of IDA, for anesthesia support given history of laryngospasm. She has been doing better on BID PPI and Reglan since I have last seen her. She denies any complaints today, otherwise feels well. I have discussed risks / benefits of the exam and anesthesia and she wants to proceed.   Past Medical History:  Diagnosis Date   Allergy    Anemia    Anxiety 01/2019   Asthma    B12 deficiency    Back pain    Common migraine with intractable migraine 06/28/2017   Constipation    Diabetes ([place]) 02/2019   Fatigue    GERD (gastroesophageal reflux disease)    pos H pylori   Hypertension    controlled with medication   IBS (irritable bowel syndrome) 2005   Joint pain    Neonatal death    Vaginal delivery, full term-lived x2 hours.    Palpitations    Shortness of breath    Shortness of breath on exertion    Spinal headache    Swelling of both lower extremities    Valvular heart disease    Vitamin D deficiency 02/2019    Past Surgical History:  Procedure Laterality Date   ADENOIDECTOMY     and tonsils (as a child)   boil  2003   right elbow   CESAREAN SECTION     x4   CESAREAN SECTION  06/02/2011   Procedure: CESAREAN SECTION;  Surgeon: [name], MD;  Location: [facility];  Service: Gynecology;  Laterality: N/A;  Primary Cesarean Section Delivery Baby Boy @ 0004, Apgars 9/9   CESAREAN SECTION N/A 12/10/2013    Procedure: REPEAT CESAREAN SECTION;  Surgeon: [name], MD;  Location: [facility];  Service: Obstetrics;  Laterality: N/A;   CESAREAN SECTION     colonoscopy  11/23/2018   stated had issues with sleep when in for procedure   OTHER SURGICAL HISTORY  2005   Uterine surgery    uterine cauterization      Prior to Admission medications   Medication Sig Start Date End Date Taking? Authorizing Provider  acetaminophen (TYLENOL) 500 MG tablet Take 500 mg by mouth every 6 (six) hours as needed.   Yes [provider]  cetirizine (ZYRTEC) 10 MG tablet Take 1 tablet (10 mg total) by mouth daily. 08/19/21  Yes Fenton Foy, NP  docusate sodium (COLACE) 100 MG capsule Take 1 capsule (100 mg total) by mouth daily. 01/01/21 01/01/22 Yes King, Diona Foley, NP  fluticasone (FLONASE) 50 MCG/ACT nasal spray PLACE 2 SPRAYS INTO BOTH NOSTRILS DAILY. 03/09/21 03/09/22 Yes Vevelyn Francois, NP  methylcellulose (CITRUCEL) oral powder Take as directed, daily 12/26/20  Yes Shaquna Geigle, Carlota Raspberry, MD  metoCLOPramide (REGLAN) 5 MG tablet Take 1 tablet (5 mg total) by mouth 3 (three) times daily before meals. 05/28/21  Yes Delena Casebeer, Carlota Raspberry, MD  metoprolol succinate (  TOPROL-XL) 100 MG 24 hr tablet Take 1 tablet (100 mg total) by mouth daily. 03/20/21  Yes Belva Crome, MD  metoprolol tartrate (LOPRESSOR) 50 MG tablet Take one tablet by mouth 2 hours prior to CT 02/25/21  Yes Belva Crome, MD  pantoprazole (PROTONIX) 40 MG tablet Take 1 tablet (40 mg total) by mouth 2 (two) times daily before a meal. 06/01/21  Yes Kathy Wares, Carlota Raspberry, MD  potassium chloride SA (KLOR-CON M) 20 MEQ tablet Take 1 tablet (20 mEq total) by mouth daily. 01/01/21 01/01/22 Yes King, Diona Foley, NP  rosuvastatin (CRESTOR) 5 MG tablet TAKE 1 TABLET (5 MG TOTAL) BY MOUTH AT BEDTIME. 07/30/21 07/30/22 Yes Fenton Foy, NP  sucralfate (CARAFATE) 1 g tablet Take 1 tablet (1 g total) by mouth every 6 (six) hours as needed. Slowly dissolve 1 tablet  in 1 tablespoon of distilled water prior to ingestion. 06/02/21  Yes Trysten Bernard, Carlota Raspberry, MD  albuterol (VENTOLIN HFA) 108 (90 Base) MCG/ACT inhaler Inhale 2 puffs into the lungs every 6 (six) hours as needed for wheezing or shortness of breath. 06/08/21   Vevelyn Francois, NP  Blood Pressure Monitoring DEVI 1 each by Does not apply route daily. 02/01/18   Lanae Boast, FNP  dicyclomine (BENTYL) 10 MG/5ML solution Take 5 mLs (10 mg total) by mouth 4 (four) times daily -  before meals and at bedtime. 03/25/21 06/23/21  Vevelyn Francois, NP  ferrous sulfate (FEROSUL) 325 (65 FE) MG tablet TAKE 1 TABLET (325 MG TOTAL) BY MOUTH DAILY. Patient not taking: Reported on 08/19/2021 02/22/20   Vevelyn Francois, NP  Fremanezumab-vfrm 225 MG/1.5ML SOAJ INJECT 225 MG INTO THE SKIN EVERY 30 (THIRTY) DAYS. 10/21/20 11/03/21  Suzzanne Cloud, NP  gabapentin (NEURONTIN) 300 MG capsule TAKE 1 CAPSULE BY MOUTH IN THE MORNING AND 2 CAPSULES IN THE EVENING. 02/22/20 03/26/21  Vevelyn Francois, NP  glucose blood (ACCU-CHEK GUIDE) test strip Use as instructed 10/23/20   Vevelyn Francois, NP  ivabradine (CORLANOR) 5 MG TABS tablet Take 3 tablets by mouth two hours prior to cardiac CT scan. Patient not taking: Reported on 08/19/2021 04/28/21   Belva Crome, MD  levocetirizine (XYZAL) 5 MG tablet TAKE 1 TABLET (5 MG TOTAL) BY MOUTH EVERY EVENING. 03/09/21 03/09/22  Vevelyn Francois, NP  Multiple Vitamin (MULTI VITAMIN DAILY PO) Take by mouth.    [provider]  norgestimate-ethinyl estradiol (ORTHO-CYCLEN) 0.25-35 MG-MCG tablet TAKE 1 TABLET BY MOUTH DAILY. 06/08/21 06/08/22  Vevelyn Francois, NP  nortriptyline (PAMELOR) 25 MG capsule Take 1 capsule (25 mg total) by mouth at bedtime. 02/22/20 02/21/21  Vevelyn Francois, NP  ondansetron (ZOFRAN-ODT) 8 MG disintegrating tablet TAKE 1 TABLET BY MOUTH EVERY 8 HOURS AS NEEDED FOR NAUSEA OR VOMITING. 04/29/21   Vevelyn Francois, NP  OneTouch Delica Lancets 67H MISC 1 application by Does  not apply route in the morning, at noon, in the evening, and at bedtime. 01/11/20   Vevelyn Francois, NP  Plecanatide (TRULANCE) 3 MG TABS Take 3 mg by mouth daily. Patient not taking: Reported on 08/19/2021 03/09/21   Yetta Flock, MD  Rimegepant Sulfate 75 MG TBDP Take 1 tab at onset of migraine. May repeat in 2 hrs, if needed. Max dose: 2 tabs/day or 15/month. This is a 90 day rx. 04/27/21   Suzzanne Cloud, NP  Semaglutide, 1 MG/DOSE, 4 MG/3ML SOPN Inject 1 mg as directed once a week. 07/15/21  Abby Potash, PA-C  tiZANidine (ZANAFLEX) 2 MG tablet Take 1 tablet (2 mg total) by mouth every 8 (eight) hours as needed for muscle spasms. Will need follow up for further refills.  12/29/2020 Patient not taking: Reported on 08/19/2021 12/29/20 12/29/21  Suzzanne Cloud, NP  Vitamin D, Ergocalciferol, (DRISDOL) 1.25 MG (50000 UNIT) CAPS capsule TAKE 1 CAPSULE  BY MOUTH EVERY 7 DAYS. 07/15/21 07/15/22  Abby Potash, PA-C  glipiZIDE (GLUCOTROL) 10 MG tablet Take 1 tablet (10 mg total) by mouth 2 (two) times daily before a meal. 02/21/20 06/13/20  Vevelyn Francois, NP    No current facility-administered medications for this encounter.    Allergies as of 06/22/2021 - Review Complete 06/17/2021  Allergen Reaction Noted   Topamax [topiramate] Itching and Rash 06/13/2018    Family History  Problem Relation Age of Onset   Diabetes Mother    Diabetes Father    Diabetes Sister    Anesthesia problems Neg Hx    Colon cancer Neg Hx    Esophageal cancer Neg Hx    Rectal cancer Neg Hx    Stomach cancer Neg Hx    Colon polyps Neg Hx    Migraines Neg Hx     Social History   Socioeconomic History   Marital status: Married    Spouse name: Armed forces technical officer   Number of children: 4   Years of education: Not on file   Highest education level: Not on file  Occupational History   Occupation: Unemployed  Tobacco Use   Smoking status: Never   Smokeless tobacco: Never  Vaping Use   Vaping Use: Never used   Substance and Sexual Activity   Alcohol use: No   Drug use: No   Sexual activity: Yes    Birth control/protection: None, Implant    Comment: pregnant  Other Topics Concern   Not on file  Social History Narrative   Lives with husband and child   Caffeine use: daily (tea), sometimes coffee/soda   Right handed    Social Determinants of Health   Financial Resource Strain: Not on file  Food Insecurity: Not on file  Transportation Needs: Not on file  Physical Activity: Not on file  Stress: Not on file  Social Connections: Not on file  Intimate Partner Violence: Not on file    Review of Systems: All other review of systems negative except as mentioned in the HPI.  Physical Exam: Vital signs There were no vitals taken for this visit.  General:   Alert,  Well-developed, pleasant and cooperative in NAD Lungs:  Clear throughout to auscultation.   Heart:  Regular rate and rhythm Abdomen:  Soft, nontender and nondistended.   Neuro/Psych:  Alert and cooperative. Normal mood and affect. A and O x 3  Jolly Mango, MD [facility]

## 2021-08-27 NOTE — Transfer of Care (Signed)
Immediate Anesthesia Transfer of Care Note  Patient: Betty Cain  Procedure(s) Performed: ESOPHAGOGASTRODUODENOSCOPY (EGD) WITH PROPOFOL BIOPSY  Patient Location: PACU and Endoscopy Unit  Anesthesia Type:MAC  Level of Consciousness: awake, alert , oriented and patient cooperative  Airway & Oxygen Therapy: Patient Spontanous Breathing and Patient connected to face mask oxygen  Post-op Assessment: Report given to RN and Post -op Vital signs reviewed and stable  Post vital signs: Reviewed and stable  Last Vitals:  Vitals Value Taken Time  BP    Temp    Pulse    Resp    SpO2      Last Pain:  Vitals:   08/27/21 0808  TempSrc: Temporal  PainSc: 0-No pain         Complications: No notable events documented.

## 2021-08-27 NOTE — Discharge Instructions (Signed)
YOU HAD AN ENDOSCOPIC PROCEDURE TODAY: Refer to the procedure report and other information in the discharge instructions given to you for any specific questions about what was found during the examination. If this information does not answer your questions, please call [place] office at [phone] to clarify.   YOU SHOULD EXPECT: Some feelings of bloating in the abdomen. Passage of more gas than usual. Walking can help get rid of the air that was put into your GI tract during the procedure and reduce the bloating. If you had a lower endoscopy (such as a colonoscopy or flexible sigmoidoscopy) you may notice spotting of blood in your stool or on the toilet paper. Some abdominal soreness may be present for a day or two, also.  DIET: Your first meal following the procedure should be a light meal and then it is ok to progress to your normal diet. A half-sandwich or bowl of soup is an example of a good first meal. Heavy or fried foods are harder to digest and may make you feel nauseous or bloated. Drink plenty of fluids but you should avoid alcoholic beverages for 24 hours. If you had a esophageal dilation, please see attached instructions for diet.    ACTIVITY: Your care partner should take you home directly after the procedure. You should plan to take it easy, moving slowly for the rest of the day. You can resume normal activity the day after the procedure however YOU SHOULD NOT DRIVE, use power tools, machinery or perform tasks that involve climbing or major physical exertion for 24 hours (because of the sedation medicines used during the test).   SYMPTOMS TO REPORT IMMEDIATELY: A gastroenterologist can be reached at any hour. Please call [phone]  for any of the following symptoms:   Following upper endoscopy (EGD, EUS, ERCP, esophageal dilation) Vomiting of blood or coffee ground material  New, significant abdominal pain  New, significant chest pain or pain under the shoulder blades  Painful or  persistently difficult swallowing  New shortness of breath  Black, tarry-looking or red, bloody stools  FOLLOW UP:  If any biopsies were taken you will be contacted by phone or by letter within the next 1-3 weeks. Call [phone]  if you have not heard about the biopsies in 3 weeks.  Please also call with any specific questions about appointments or follow up tests.  

## 2021-08-28 LAB — SURGICAL PATHOLOGY

## 2021-08-31 ENCOUNTER — Encounter (HOSPITAL_COMMUNITY): Payer: Self-pay | Admitting: Gastroenterology

## 2021-09-02 ENCOUNTER — Ambulatory Visit (INDEPENDENT_AMBULATORY_CARE_PROVIDER_SITE_OTHER): Payer: Medicaid Other | Admitting: Nurse Practitioner

## 2021-09-02 ENCOUNTER — Ambulatory Visit (INDEPENDENT_AMBULATORY_CARE_PROVIDER_SITE_OTHER): Payer: Medicaid Other | Admitting: Podiatry

## 2021-09-02 ENCOUNTER — Encounter (INDEPENDENT_AMBULATORY_CARE_PROVIDER_SITE_OTHER): Payer: Self-pay | Admitting: Nurse Practitioner

## 2021-09-02 VITALS — BP 94/65 | HR 87 | Temp 98.4°F | Ht <= 58 in | Wt 122.0 lb

## 2021-09-02 DIAGNOSIS — E1140 Type 2 diabetes mellitus with diabetic neuropathy, unspecified: Secondary | ICD-10-CM | POA: Diagnosis not present

## 2021-09-02 DIAGNOSIS — R5383 Other fatigue: Secondary | ICD-10-CM

## 2021-09-02 DIAGNOSIS — Z6825 Body mass index (BMI) 25.0-25.9, adult: Secondary | ICD-10-CM | POA: Diagnosis not present

## 2021-09-02 DIAGNOSIS — Z7985 Long-term (current) use of injectable non-insulin antidiabetic drugs: Secondary | ICD-10-CM

## 2021-09-02 DIAGNOSIS — E785 Hyperlipidemia, unspecified: Secondary | ICD-10-CM

## 2021-09-02 DIAGNOSIS — E669 Obesity, unspecified: Secondary | ICD-10-CM | POA: Diagnosis not present

## 2021-09-02 DIAGNOSIS — E1169 Type 2 diabetes mellitus with other specified complication: Secondary | ICD-10-CM

## 2021-09-02 DIAGNOSIS — L989 Disorder of the skin and subcutaneous tissue, unspecified: Secondary | ICD-10-CM | POA: Diagnosis not present

## 2021-09-02 DIAGNOSIS — M7752 Other enthesopathy of left foot: Secondary | ICD-10-CM | POA: Diagnosis not present

## 2021-09-02 MED ORDER — OZEMPIC (0.25 OR 0.5 MG/DOSE) 2 MG/3ML ~~LOC~~ SOPN: 0.5000 mg | PEN_INJECTOR | SUBCUTANEOUS | 0 refills | Status: DC

## 2021-09-02 MED ORDER — MELOXICAM 15 MG PO TABS: 15.0000 mg | ORAL_TABLET | Freq: Every day | ORAL | 1 refills | Status: DC

## 2021-09-02 MED ORDER — BLOOD GLUCOSE MONITOR KIT: PACK | 0 refills | Status: DC

## 2021-09-02 MED ORDER — BETAMETHASONE SOD PHOS & ACET 6 (3-3) MG/ML IJ SUSP
3.0000 mg | Freq: Once | INTRAMUSCULAR | Status: AC
  Administered 2021-09-02: 3 mg via INTRA_ARTICULAR

## 2021-09-02 NOTE — Progress Notes (Signed)
HPI: 46 y.o. female presenting today as a new patient for evaluation of left forefoot pain has been going on for few months now.  Patient denies a history of injury.  Idiopathic onset.  She cannot recall an incident that would have elicited the pain.  It is painful with ambulation.  Patient rates the pain 8/10.  She also has a callus formation to the bottom of the left great toe which is sensitive as well.  Past Medical History:  Diagnosis Date   Allergy    Anemia    Anxiety 01/2019   Asthma    B12 deficiency    Back pain    Common migraine with intractable migraine 06/28/2017   Constipation    Diabetes ([place]) 02/2019   Fatigue    GERD (gastroesophageal reflux disease)    pos H pylori   Hypertension    controlled with medication   IBS (irritable bowel syndrome) 2005   Joint pain    Neonatal death    Vaginal delivery, full term-lived x2 hours.    Palpitations    Shortness of breath    Shortness of breath on exertion    Spinal headache    Swelling of both lower extremities    Valvular heart disease    Vitamin D deficiency 02/2019    Past Surgical History:  Procedure Laterality Date   ADENOIDECTOMY     and tonsils (as a child)   BIOPSY  08/27/2021   Procedure: BIOPSY;  Surgeon: Yetta Flock, MD;  Location: [facility];  Service: Gastroenterology;;   boil  2003   right elbow   CESAREAN SECTION     x4   CESAREAN SECTION  06/02/2011   Procedure: CESAREAN SECTION;  Surgeon: [name], MD;  Location: [facility];  Service: Gynecology;  Laterality: N/A;  Primary Cesarean Section Delivery Baby Boy @ 0004, Apgars 9/9   CESAREAN SECTION N/A 12/10/2013   Procedure: REPEAT CESAREAN SECTION;  Surgeon: [name], MD;  Location: [facility];  Service: Obstetrics;  Laterality: N/A;   CESAREAN SECTION     colonoscopy  11/23/2018   stated had issues with sleep when in for procedure   ESOPHAGOGASTRODUODENOSCOPY (EGD) WITH PROPOFOL N/A 08/27/2021   Procedure:  ESOPHAGOGASTRODUODENOSCOPY (EGD) WITH PROPOFOL;  Surgeon: Yetta Flock, MD;  Location: [facility];  Service: Gastroenterology;  Laterality: N/A;   OTHER SURGICAL HISTORY  2005   Uterine surgery    uterine cauterization      Allergies  Allergen Reactions   Topamax [Topiramate] Itching and Rash     Physical Exam: General: The patient is alert and oriented x3 in no acute distress.  Dermatology: Skin is warm, dry and supple bilateral lower extremities. Negative for open lesions or macerations.  There is a hyperkeratotic skin lesion to the plantar aspect of the left great toe with a central nucleated core  Vascular: Palpable pedal pulses bilaterally. Capillary refill within normal limits.  Negative for any significant edema or erythema  Neurological: Light touch and protective threshold grossly intact  Musculoskeletal Exam: No pedal deformities noted.  There is pain on palpation range of motion of the third MTP joint of the left foot  Radiographic Exam:  Normal osseous mineralization. Joint spaces preserved. No fracture/dislocation/boney destruction.    Assessment: 1.  Third MTP capsulitis left 2.  Symptomatic skin lesion plantar aspect of the left hallux possibly secondary to splinter foreign body   Plan of Care:  1. Patient evaluated. X-Rays reviewed.  2.  Sunday [place]  debridement of the skin lesion was performed using a 312 scalpel without incident or bleeding.  The patient did feel some relief 3.  Injection of 0.5 cc Celestone Soluspan injected into the third MTP left foot 4.  Prescription for meloxicam 15 mg daily 5.  Patient admits to walking around the house barefoot on linoleum floors.  Advised against going barefoot.  Recommend good supportive shoes and sneakers even around the house 6.  Return to clinic 4 weeks  *From [place]      Rolande Moe M. Darianna Amy, DPM [facility]  Dr. Edrick Kins, DPM    [address], [place] [address]                Office [phone]  Fax [phone]

## 2021-09-04 ENCOUNTER — Other Ambulatory Visit: Payer: Self-pay

## 2021-09-09 ENCOUNTER — Encounter (INDEPENDENT_AMBULATORY_CARE_PROVIDER_SITE_OTHER): Payer: Self-pay

## 2021-09-09 ENCOUNTER — Encounter (INDEPENDENT_AMBULATORY_CARE_PROVIDER_SITE_OTHER): Payer: Self-pay | Admitting: Family Medicine

## 2021-09-09 NOTE — Telephone Encounter (Signed)
Betty Cain

## 2021-09-10 NOTE — Progress Notes (Signed)
Chief Complaint:   OBESITY Betty Cain is here to discuss her progress with her obesity treatment plan along with follow-up of her obesity related diagnoses. Betty Cain is on the Category 2 Plan and states she is following her eating plan approximately 65% of the time. Betty Cain states she is walking 20 minutes 1-2 times per week.  Today's visit was #: 23 Starting weight: 152 lbs Starting date: 01/10/2020 Today's weight: 122 lbs Today's date: 09/02/2021 Total lbs lost to date: 30 lbs Total lbs lost since last in-office visit: 1  Interim History: Betty Cain is doing well with wt loss, she is not skipping meals, meeting protein and calorie goals. She denies hunger. She is drinking water but not enough  and Coke. Interpretor present.  Subjective:   1. Type 2 diabetes mellitus with diabetic neuropathy, without long-term current use of insulin (HCC) Aleyza is currently taking Ozempic 62m. Her last A1c at 5.0.  Reports fatigue. Her fasting blood sugar 65-78. She reports an episode of hypoglycemia around 3-4 weeks ago.  Denies any further low blood sugars.    2. Hyperlipidemia associated with type 2 diabetes mellitus ([place] is currently taking Crestor 5 mg. Denies any side effects.Her last lipids, 05/19/21.  3. Other fatigue Pang has history of low Vit D, low B12 and Iron deficiency anemia.  Assessment/Plan:   1. Type 2 diabetes mellitus with diabetic neuropathy, without long-term current use of insulin (HCC) We will decrease Ozempic to 0.5 mg, side effects discussed. Will monitor for hypoglycemia. New monitor and strips prescribed.  - Fill blood glucose meter kit and supplies KIT; Dispense based on patient and insurance preference. Use up to four times daily as directed.  Dispense: 1 each; Refill: 0  -DECREASE Semaglutide,0.25 or 0.5MG/DOS, (OZEMPIC, 0.25 OR 0.5 MG/DOSE,) 2 MG/3ML SOPN; Inject 0.5 mg into the skin once a week.  Dispense: 3 mL; Refill: 0  2. Hyperlipidemia associated with type 2 diabetes  mellitus ([place] Patient will follow up with PCP and continue medications as directed.  3. Other fatigue We will check labs at next visit if still reporting fatigue. I wonder if her fatigue is due to low blood sugars. Would like to see how she does with the decrease of Ozempic. Would also like her to see PCP to discuss her current medications, as she is on multiple medications that can cause fatigue. Wondering if fatigue is due to hypotension. She is to discuss with PCP.  4. Obesity: Current BMI 25.6 Betty Cain is currently in the action stage of change. As such, her goal is to continue with weight loss efforts. She has agreed to the Category 2 Plan.   Exercise goals: As is adding resistance training.  Behavioral modification strategies: increasing lean protein intake, increasing water intake, no skipping meals, and planning for success.  ACenahas agreed to follow-up with our clinic in 3 weeks. She was informed of the importance of frequent follow-up visits to maximize her success with intensive lifestyle modifications for her multiple health conditions.   Objective:   Blood pressure 94/65, pulse 87, temperature 98.4 F (36.9 C), height 4' 10"  (1.473 m), weight 122 lb (55.3 kg), SpO2 99 %. Body mass index is 25.5 kg/m.  General: Cooperative, alert, well developed, in no acute distress. HEENT: Conjunctivae and lids unremarkable. Cardiovascular: Regular rhythm.  Lungs: Normal work of breathing. Neurologic: No focal deficits.   Lab Results  Component Value Date   CREATININE 0.63 08/19/2021   BUN 13 08/19/2021   NA 142 08/19/2021  K 4.6 08/19/2021   CL 105 08/19/2021   CO2 24 08/19/2021   Lab Results  Component Value Date   ALT 15 08/19/2021   AST 13 08/19/2021   ALKPHOS 68 08/19/2021   BILITOT 0.3 08/19/2021   Lab Results  Component Value Date   HGBA1C 5.0 08/19/2021   HGBA1C 5.0 08/19/2021   HGBA1C 5.0 (A) 08/19/2021   HGBA1C 5.0 08/19/2021   HGBA1C 5.7 (A) 01/01/2021   Lab  Results  Component Value Date   INSULIN 8.7 01/10/2020   Lab Results  Component Value Date   TSH 1.430 12/22/2020   Lab Results  Component Value Date   CHOL 120 05/19/2021   HDL 37 (L) 05/19/2021   LDLCALC 66 05/19/2021   TRIG 84 05/19/2021   CHOLHDL 3.2 05/19/2021   Lab Results  Component Value Date   VD25OH 30.0 01/01/2021   VD25OH 42.6 09/29/2020   VD25OH 22.9 (L) 01/10/2020   Lab Results  Component Value Date   WBC 6.5 08/19/2021   HGB 13.1 08/19/2021   HCT 38.7 08/19/2021   MCV 90 08/19/2021   PLT 358 08/19/2021   Lab Results  Component Value Date   IRON 83 05/19/2021   TIBC 230 (L) 05/19/2021   FERRITIN 478 (H) 05/19/2021   Attestation Statements:   Reviewed by clinician on day of visit: allergies, medications, problem list, medical history, surgical history, family history, social history, and previous encounter notes.  I, Brendell Tyus, RMA, am acting as transcriptionist for Everardo Pacific, FNP. .  I have reviewed the above documentation for accuracy and completeness, and I agree with the above. Everardo Pacific, FNP

## 2021-09-11 ENCOUNTER — Other Ambulatory Visit: Payer: Self-pay

## 2021-09-11 ENCOUNTER — Emergency Department (HOSPITAL_BASED_OUTPATIENT_CLINIC_OR_DEPARTMENT_OTHER)
Admission: EM | Admit: 2021-09-11 | Discharge: 2021-09-11 | Disposition: A | Discharge status: Home / Self Care | Payer: Medicaid Other | Attending: Emergency Medicine | Admitting: Emergency Medicine

## 2021-09-11 ENCOUNTER — Encounter (HOSPITAL_BASED_OUTPATIENT_CLINIC_OR_DEPARTMENT_OTHER): Payer: Self-pay | Admitting: *Deleted

## 2021-09-11 DIAGNOSIS — K089 Disorder of teeth and supporting structures, unspecified: Secondary | ICD-10-CM | POA: Diagnosis present

## 2021-09-11 DIAGNOSIS — I10 Essential (primary) hypertension: Secondary | ICD-10-CM | POA: Diagnosis not present

## 2021-09-11 DIAGNOSIS — Z79899 Other long term (current) drug therapy: Secondary | ICD-10-CM | POA: Insufficient documentation

## 2021-09-11 DIAGNOSIS — K047 Periapical abscess without sinus: Secondary | ICD-10-CM | POA: Diagnosis not present

## 2021-09-11 DIAGNOSIS — Z7984 Long term (current) use of oral hypoglycemic drugs: Secondary | ICD-10-CM | POA: Insufficient documentation

## 2021-09-11 DIAGNOSIS — E119 Type 2 diabetes mellitus without complications: Secondary | ICD-10-CM | POA: Diagnosis not present

## 2021-09-11 MED ORDER — PENICILLIN V POTASSIUM 500 MG PO TABS
500.0000 mg | ORAL_TABLET | Freq: Four times a day (QID) | ORAL | 0 refills | Status: AC
Start: 2021-09-11 — End: 2021-09-18

## 2021-09-11 MED ORDER — OXYCODONE-ACETAMINOPHEN 5-325 MG PO TABS: 1.0000 | ORAL_TABLET | Freq: Four times a day (QID) | ORAL | 0 refills | Status: DC | PRN

## 2021-09-11 NOTE — ED Notes (Signed)
Discharge paperwork given and understood. 

## 2021-09-11 NOTE — ED Provider Notes (Signed)
[place] EMERGENCY DEPT Provider Note   CSN: [id number] Arrival date & time: 09/11/21  1533     History {Add pertinent medical, surgical, social history, OB history to HPI:1} Chief Complaint  Patient presents with   Dental Pain    Betty Cain is a 46 y.o. female.  She has a history of diabetes hypertension.  Complaining of 2 days of right upper molar pain after chewing on nuts.  Rates the pain as severe and it radiates into her face and right ear.  No known fevers.  Tried ibuprofen without improvement.  She does not have a dentist.  The history is provided by the patient and the spouse. The history is limited by a language barrier. A language interpreter was used (arabic ipad).  Dental Pain Location:  Upper Upper teeth location:  2/RU 2nd molar Quality:  Throbbing Severity:  Severe Onset quality:  Gradual Duration:  2 days Timing:  Constant Progression:  Unchanged Chronicity:  New Context: dental fracture and filling fell out   Relieved by:  Nothing Worsened by:  Cold food/drink and hot food/drink Ineffective treatments:  NSAIDs Associated symptoms: facial pain   Associated symptoms: no difficulty swallowing, no facial swelling, no fever, no neck pain, no neck swelling and no trismus   Risk factors: lack of dental care   Risk factors: no smoking       Home Medications Prior to Admission medications   Medication Sig Start Date End Date Taking? Authorizing Provider  acetaminophen (TYLENOL) 500 MG tablet Take 500 mg by mouth every 6 (six) hours as needed.    [provider]  albuterol (VENTOLIN HFA) 108 (90 Base) MCG/ACT inhaler Inhale 2 puffs into the lungs every 6 (six) hours as needed for wheezing or shortness of breath. 06/08/21   Vevelyn Francois, NP  blood glucose meter kit and supplies KIT Dispense based on patient and insurance preference. Use up to four times daily as directed. 09/02/21   Everardo Pacific, FNP  Blood Pressure Monitoring  DEVI 1 each by Does not apply route daily. 02/01/18   Lanae Boast, FNP  cetirizine (ZYRTEC) 10 MG tablet Take 1 tablet (10 mg total) by mouth daily. 08/19/21   Fenton Foy, NP  dicyclomine (BENTYL) 10 MG/5ML solution Take 5 mLs (10 mg total) by mouth 4 (four) times daily -  before meals and at bedtime. 03/25/21 06/23/21  Vevelyn Francois, NP  docusate sodium (COLACE) 100 MG capsule Take 1 capsule (100 mg total) by mouth daily. 01/01/21 01/01/22  Vevelyn Francois, NP  ferrous sulfate (FEROSUL) 325 (65 FE) MG tablet TAKE 1 TABLET (325 MG TOTAL) BY MOUTH DAILY. 02/22/20   Vevelyn Francois, NP  fluticasone (FLONASE) 50 MCG/ACT nasal spray PLACE 2 SPRAYS INTO BOTH NOSTRILS DAILY. 03/09/21 03/09/22  Vevelyn Francois, NP  Fremanezumab-vfrm 225 MG/1.5ML SOAJ INJECT 225 MG INTO THE SKIN EVERY 30 (THIRTY) DAYS. 10/21/20 11/03/21  Suzzanne Cloud, NP  gabapentin (NEURONTIN) 300 MG capsule TAKE 1 CAPSULE BY MOUTH IN THE MORNING AND 2 CAPSULES IN THE EVENING. 02/22/20 03/26/21  Vevelyn Francois, NP  glucose blood (ACCU-CHEK GUIDE) test strip Use as instructed 10/23/20   Vevelyn Francois, NP  ivabradine (CORLANOR) 5 MG TABS tablet Take 3 tablets by mouth two hours prior to cardiac CT scan. 04/28/21   Belva Crome, MD  levocetirizine (XYZAL) 5 MG tablet TAKE 1 TABLET (5 MG TOTAL) BY MOUTH EVERY EVENING. 03/09/21 03/09/22  Vevelyn Francois, NP  meloxicam (  MOBIC) 15 MG tablet Take 1 tablet (15 mg total) by mouth daily. 09/02/21   Edrick Kins, DPM  methylcellulose (CITRUCEL) oral powder Take as directed, daily 12/26/20   Armbruster, Carlota Raspberry, MD  metoCLOPramide (REGLAN) 5 MG tablet Take 1 tablet (5 mg total) by mouth 3 (three) times daily before meals. 05/28/21   Armbruster, Carlota Raspberry, MD  metoprolol succinate (TOPROL-XL) 100 MG 24 hr tablet Take 1 tablet (100 mg total) by mouth daily. 03/20/21   Belva Crome, MD  metoprolol tartrate (LOPRESSOR) 50 MG tablet Take one tablet by mouth 2 hours prior to CT 02/25/21   Belva Crome, MD  Multiple Vitamin (MULTI VITAMIN DAILY PO) Take by mouth.    [provider]  norgestimate-ethinyl estradiol (ORTHO-CYCLEN) 0.25-35 MG-MCG tablet TAKE 1 TABLET BY MOUTH DAILY. 06/08/21 06/08/22  Vevelyn Francois, NP  nortriptyline (PAMELOR) 25 MG capsule Take 1 capsule (25 mg total) by mouth at bedtime. 02/22/20 02/21/21  Vevelyn Francois, NP  ondansetron (ZOFRAN-ODT) 8 MG disintegrating tablet TAKE 1 TABLET BY MOUTH EVERY 8 HOURS AS NEEDED FOR NAUSEA OR VOMITING. 04/29/21   Vevelyn Francois, NP  OneTouch Delica Lancets 41O MISC 1 application by Does not apply route in the morning, at noon, in the evening, and at bedtime. 01/11/20   Vevelyn Francois, NP  pantoprazole (PROTONIX) 40 MG tablet Take 1 tablet (40 mg total) by mouth 2 (two) times daily before a meal. 06/01/21   Armbruster, Carlota Raspberry, MD  Plecanatide (TRULANCE) 3 MG TABS Take 3 mg by mouth daily. 03/09/21   Armbruster, Carlota Raspberry, MD  potassium chloride SA (KLOR-CON M) 20 MEQ tablet Take 1 tablet (20 mEq total) by mouth daily. 01/01/21 01/01/22  Vevelyn Francois, NP  Rimegepant Sulfate 75 MG TBDP Take 1 tab at onset of migraine. May repeat in 2 hrs, if needed. Max dose: 2 tabs/day or 15/month. This is a 90 day rx. 04/27/21   Suzzanne Cloud, NP  rosuvastatin (CRESTOR) 5 MG tablet TAKE 1 TABLET (5 MG TOTAL) BY MOUTH AT BEDTIME. 07/30/21 07/30/22  Fenton Foy, NP  Semaglutide,0.25 or 0.5MG/DOS, (OZEMPIC, 0.25 OR 0.5 MG/DOSE,) 2 MG/3ML SOPN Inject 0.5 mg into the skin once a week. 09/02/21   Everardo Pacific, FNP  sucralfate (CARAFATE) 1 g tablet Take 1 tablet (1 g total) by mouth every 6 (six) hours as needed. Slowly dissolve 1 tablet in 1 tablespoon of distilled water prior to ingestion. 06/02/21   Armbruster, Carlota Raspberry, MD  tiZANidine (ZANAFLEX) 2 MG tablet Take 1 tablet (2 mg total) by mouth every 8 (eight) hours as needed for muscle spasms. Will need follow up for further refills.  12/29/2020 12/29/20 12/29/21  Suzzanne Cloud, NP   Vitamin D, Ergocalciferol, (DRISDOL) 1.25 MG (50000 UNIT) CAPS capsule TAKE 1 CAPSULE  BY MOUTH EVERY 7 DAYS. 07/15/21 07/15/22  Abby Potash, PA-C  glipiZIDE (GLUCOTROL) 10 MG tablet Take 1 tablet (10 mg total) by mouth 2 (two) times daily before a meal. 02/21/20 06/13/20  Vevelyn Francois, NP      Allergies    Topamax [topiramate]    Review of Systems   Review of Systems  Constitutional:  Positive for fatigue. Negative for fever.  HENT:  Positive for dental problem and ear pain. Negative for facial swelling.   Eyes:  Negative for visual disturbance.  Respiratory:  Negative for cough.   Cardiovascular:  Negative for chest pain.  Musculoskeletal:  Negative for neck pain.  Skin:  Negative for rash.   Physical Exam Updated Vital Signs BP 129/78 (BP Location: Right Arm)   Pulse 79   Temp 98.4 F (36.9 C)   Resp 18   LMP 08/02/2021   SpO2 100%  Physical Exam Vitals and nursing note reviewed.  Constitutional:      General: She is not in acute distress.    Appearance: Normal appearance. She is well-developed.  HENT:     Head: Normocephalic and atraumatic.     Right Ear: Tympanic membrane normal.     Left Ear: Tympanic membrane normal.     Nose: Nose normal.     Mouth/Throat:     Mouth: Mucous membranes are moist.     Pharynx: Oropharynx is clear.     Comments: He has fair dentition.  Her right middle molar upper has missing filling.  No significant gingival edema.  Has percussion tenderness to this.  No facial asymmetry or swelling.  No trismus.  No neck swelling. Eyes:     Conjunctiva/sclera: Conjunctivae normal.  Cardiovascular:     Rate and Rhythm: Normal rate and regular rhythm.     Heart sounds: No murmur heard. Pulmonary:     Effort: Pulmonary effort is normal. No respiratory distress.     Breath sounds: Normal breath sounds.  Abdominal:     Palpations: Abdomen is soft.     Tenderness: There is no abdominal tenderness. There is no guarding or rebound.   Musculoskeletal:     Cervical back: Neck supple.  Skin:    General: Skin is warm and dry.     Capillary Refill: Capillary refill takes less than 2 seconds.  Neurological:     General: No focal deficit present.     Mental Status: She is alert.    ED Results / Procedures / Treatments   Labs (all labs ordered are listed, but only abnormal results are displayed) Labs Reviewed - No data to display  EKG None  Radiology No results found.  Procedures Procedures  {Document cardiac monitor, telemetry assessment procedure when appropriate:1}  Medications Ordered in ED Medications - No data to display  ED Course/ Medical Decision Making/ A&P                           Medical Decision Making  ***  {Document critical care time when appropriate:1} {Document review of labs and clinical decision tools ie heart score, Chads2Vasc2 etc:1}  {Document your independent review of radiology images, and any outside records:1} {Document your discussion with family members, caretakers, and with consultants:1} {Document social determinants of health affecting pt's care:1} {Document your decision making why or why not admission, treatments were needed:1} Final Clinical Impression(s) / ED Diagnoses Final diagnoses:  None    Rx / DC Orders ED Discharge Orders     None

## 2021-09-11 NOTE — ED Triage Notes (Signed)
Pt is here for dental pain from her right lower molar.  Pain radiating to her right ear and into right neck.  Pain has been increasing for the last 2 days

## 2021-09-16 NOTE — Telephone Encounter (Signed)
Please schedule appointment for patient in 2 weeks. Thanks.

## 2021-09-21 ENCOUNTER — Other Ambulatory Visit: Payer: Self-pay

## 2021-09-22 ENCOUNTER — Encounter (INDEPENDENT_AMBULATORY_CARE_PROVIDER_SITE_OTHER): Payer: Self-pay | Admitting: Nurse Practitioner

## 2021-09-22 ENCOUNTER — Ambulatory Visit (INDEPENDENT_AMBULATORY_CARE_PROVIDER_SITE_OTHER): Payer: Medicaid Other | Admitting: Nurse Practitioner

## 2021-09-22 VITALS — BP 112/76 | HR 90 | Temp 99.0°F | Ht <= 58 in | Wt 125.0 lb

## 2021-09-22 DIAGNOSIS — E559 Vitamin D deficiency, unspecified: Secondary | ICD-10-CM | POA: Diagnosis not present

## 2021-09-22 DIAGNOSIS — Z6826 Body mass index (BMI) 26.0-26.9, adult: Secondary | ICD-10-CM | POA: Diagnosis not present

## 2021-09-22 DIAGNOSIS — E1140 Type 2 diabetes mellitus with diabetic neuropathy, unspecified: Secondary | ICD-10-CM | POA: Diagnosis not present

## 2021-09-22 DIAGNOSIS — Z7985 Long-term (current) use of injectable non-insulin antidiabetic drugs: Secondary | ICD-10-CM

## 2021-09-22 DIAGNOSIS — E669 Obesity, unspecified: Secondary | ICD-10-CM

## 2021-09-22 MED ORDER — VITAMIN D (ERGOCALCIFEROL) 1.25 MG (50000 UNIT) PO CAPS: ORAL_CAPSULE | ORAL | 0 refills | Status: DC

## 2021-09-23 NOTE — Progress Notes (Signed)
Chief Complaint:   OBESITY Betty Cain is here to discuss her progress with her obesity treatment plan along with follow-up of her obesity related diagnoses. Betty Cain is on the Category 2 Plan and states she is following her eating plan approximately 75% of the time. Betty Cain states she is dancing and walking 5 minutes 2-3 times per week.  Today's visit was #: 24 Starting weight: 152 lbs Starting date: 01/10/2020 Today's weight: 125 lbs Today's date: 09/22/2021 Total lbs lost to date: 27 lbs Total lbs lost since last in-office visit: 0  Interim History: Rhythm is struggling with teeth pain. She went to the ER on 09/11/21 and is currently on an antibiotic. Struggling with eating due too pain. She has an appointment scheduled with dentist today. Interpretor present.  Subjective:   1. Type 2 diabetes mellitus with diabetic neuropathy, without long-term current use of insulin (HCC) [name]'s fasting blood sugar=120. She denies hypoglycemia. She is currently on Ozempic, denies any side effects. Overall doing better with decrease in Ozempic dose after her last visit. Fatigue is better.  2. Vitamin D deficiency Betty Cain is currently taking prescription Vit D 50,000 IU once a week. Denies any nausea, vomiting or muscle weakness.  Assessment/Plan:   1. Type 2 diabetes mellitus with diabetic neuropathy, without long-term current use of insulin (HCC) Betty Cain will continue Ozempic 0.5 mg. Side effects discussed.   Good blood sugar control is important to decrease the likelihood of diabetic complications such as nephropathy, neuropathy, limb loss, blindness, coronary artery disease, and death. Intensive lifestyle modification including diet, exercise and weight loss are the first line of treatment for diabetes.    2. Vitamin D deficiency We will refill Vit D 50,000 IU once a week for 1 month with 0 refills.  Low Vitamin D level contributes to fatigue and are associated with obesity, breast, and colon cancer. She  agrees to continue to take prescription Vitamin D '@50'$ ,000 IU every week and will follow-up for routine testing of Vitamin D, at least 2-3 times per year to avoid over-replacement.   -Refill Vitamin D, Ergocalciferol, (DRISDOL) 1.25 MG (50000 UNIT) CAPS capsule; TAKE 1 CAPSULE  BY MOUTH EVERY 7 DAYS.  Dispense: 4 capsule; Refill: 0  3. Obesity: Current BMI 26.3 Betty Cain is currently in the action stage of change. As such, her goal is to continue with weight loss efforts. She has agreed to practicing portion control and making smarter food choices, such as increasing vegetables and decreasing simple carbohydrates.   Exercise goals: As is.  Betty Cain will start a protein shake due to struggling with eating the current plan.  Behavioral modification strategies: increasing lean protein intake, increasing water intake, and planning for success.  Betty Cain has agreed to follow-up with our clinic in 3 weeks. She was informed of the importance of frequent follow-up visits to maximize her success with intensive lifestyle modifications for her multiple health conditions.   Objective:   Blood pressure 112/76, pulse 90, temperature 99 F (37.2 C), height '4\' 10"'$  (1.473 m), weight 125 lb (56.7 kg), last menstrual period 08/02/2021, SpO2 98 %. Body mass index is 26.13 kg/m.  General: Cooperative, alert, well developed, in no acute distress. HEENT: Conjunctivae and lids unremarkable. Cardiovascular: Regular rhythm.  Lungs: Normal work of breathing. Neurologic: No focal deficits.   Lab Results  Component Value Date   CREATININE 0.63 08/19/2021   BUN 13 08/19/2021   NA 142 08/19/2021   K 4.6 08/19/2021   CL 105 08/19/2021   CO2 24  08/19/2021   Lab Results  Component Value Date   ALT 15 08/19/2021   AST 13 08/19/2021   ALKPHOS 68 08/19/2021   BILITOT 0.3 08/19/2021   Lab Results  Component Value Date   HGBA1C 5.0 08/19/2021   HGBA1C 5.0 08/19/2021   HGBA1C 5.0 (A) 08/19/2021   HGBA1C 5.0 08/19/2021    HGBA1C 5.7 (A) 01/01/2021   Lab Results  Component Value Date   INSULIN 8.7 01/10/2020   Lab Results  Component Value Date   TSH 1.430 12/22/2020   Lab Results  Component Value Date   CHOL 120 05/19/2021   HDL 37 (L) 05/19/2021   LDLCALC 66 05/19/2021   TRIG 84 05/19/2021   CHOLHDL 3.2 05/19/2021   Lab Results  Component Value Date   VD25OH 30.0 01/01/2021   VD25OH 42.6 09/29/2020   VD25OH 22.9 (L) 01/10/2020   Lab Results  Component Value Date   WBC 6.5 08/19/2021   HGB 13.1 08/19/2021   HCT 38.7 08/19/2021   MCV 90 08/19/2021   PLT 358 08/19/2021   Lab Results  Component Value Date   IRON 83 05/19/2021   TIBC 230 (L) 05/19/2021   FERRITIN 478 (H) 05/19/2021   Attestation Statements:   Reviewed by clinician on day of visit: allergies, medications, problem list, medical history, surgical history, family history, social history, and previous encounter notes.  I, Brendell Tyus, RMA, am acting as transcriptionist for Everardo Pacific, FNP..  I have reviewed the above documentation for accuracy and completeness, and I agree with the above. Everardo Pacific, FNP

## 2021-09-24 ENCOUNTER — Encounter (INDEPENDENT_AMBULATORY_CARE_PROVIDER_SITE_OTHER): Payer: Self-pay | Admitting: Nurse Practitioner

## 2021-09-24 ENCOUNTER — Encounter (INDEPENDENT_AMBULATORY_CARE_PROVIDER_SITE_OTHER): Payer: Self-pay | Admitting: Family Medicine

## 2021-09-24 NOTE — Telephone Encounter (Signed)
Betty Cain

## 2021-09-30 NOTE — Telephone Encounter (Signed)
Betty Cain

## 2021-10-05 ENCOUNTER — Ambulatory Visit: Payer: Medicaid Other | Admitting: Podiatry

## 2021-10-05 DIAGNOSIS — M7752 Other enthesopathy of left foot: Secondary | ICD-10-CM | POA: Diagnosis not present

## 2021-10-05 MED ORDER — BETAMETHASONE SOD PHOS & ACET 6 (3-3) MG/ML IJ SUSP
3.0000 mg | Freq: Once | INTRAMUSCULAR | Status: AC
  Administered 2021-10-05: 3 mg via INTRA_ARTICULAR

## 2021-10-09 ENCOUNTER — Encounter: Payer: Self-pay | Admitting: Nurse Practitioner

## 2021-10-09 ENCOUNTER — Ambulatory Visit: Payer: Medicaid Other | Admitting: Nurse Practitioner

## 2021-10-09 VITALS — BP 103/69 | HR 83 | Temp 98.1°F | Ht <= 58 in | Wt 130.6 lb

## 2021-10-09 DIAGNOSIS — Z0000 Encounter for general adult medical examination without abnormal findings: Secondary | ICD-10-CM | POA: Diagnosis not present

## 2021-10-09 MED ORDER — ROSUVASTATIN CALCIUM 5 MG PO TABS: ORAL_TABLET | Freq: Every day | ORAL | 0 refills | Status: DC

## 2021-10-09 NOTE — Progress Notes (Signed)
_0  ID: Betty Cain, female    DOB: 06-22-1975, 46 y.o.   MRN: [id number]  Chief Complaint  Patient presents with   Follow-up    Pt states her nutritionist told her she need to go over her medications because they all are similar. Pt is requesting to do a blood pregnancy test. Pt is requesting refill on rosuvastatin      Referring provider: Fenton Foy, NP   HPI  Patient presents today for a med check.  Her nutritionist told her that she may be taking multiple medications for the same issues.  We discussed that she sees multiple specialist who are prescribing her medications.  And they are prescribing these medications for a reason.  She should continue to take these with the exception of vitamin D.  She states that this medication contains pork products which is against her religion.  Her last vitamin D level was normal.  We discussed that she can switch to vitamin D3 1000 2000 IUs daily OTC and the pharmacist can help her pick which brand would be best considering her religious exemption's. Denies f/c/s, n/v/d, hemoptysis, PND, leg swelling Denies chest pain or edema   Note: patient requested pregnancy test with blood work today. States that urine pregnancy tests have been negative so far.         Allergies  Allergen Reactions   Topamax [Topiramate] Itching and Rash    Immunization History  Administered Date(s) Administered   Influenza,inj,Quad PF,6+ Mos 06/19/2013, 12/02/2015   PFIZER(Purple Top)SARS-COV-2 Vaccination 07/07/2019, 07/28/2019   Pneumococcal Conjugate-13 05/21/2019   Tdap 09/13/2013, 12/11/2013    Past Medical History:  Diagnosis Date   Allergy    Anemia    Anxiety 01/2019   Asthma    B12 deficiency    Back pain    Common migraine with intractable migraine 06/28/2017   Constipation    Diabetes ([place]) 02/2019   Fatigue    GERD (gastroesophageal reflux disease)    pos H pylori   Hypertension    controlled with medication   IBS (irritable  bowel syndrome) 2005   Joint pain    Neonatal death    Vaginal delivery, full term-lived x2 hours.    Palpitations    Shortness of breath    Shortness of breath on exertion    Spinal headache    Swelling of both lower extremities    Valvular heart disease    Vitamin D deficiency 02/2019    Tobacco History: Social History   Tobacco Use  Smoking Status Never  Smokeless Tobacco Never   Counseling given: Not Answered   Outpatient Encounter Medications as of 10/09/2021  Medication Sig   acetaminophen (TYLENOL) 500 MG tablet Take 500 mg by mouth every 6 (six) hours as needed.   albuterol (VENTOLIN HFA) 108 (90 Base) MCG/ACT inhaler Inhale 2 puffs into the lungs every 6 (six) hours as needed for wheezing or shortness of breath.   blood glucose meter kit and supplies KIT Dispense based on patient and insurance preference. Use up to four times daily as directed.   Blood Pressure Monitoring DEVI 1 each by Does not apply route daily.   cetirizine (ZYRTEC) 10 MG tablet Take 1 tablet (10 mg total) by mouth daily.   ferrous sulfate (FEROSUL) 325 (65 FE) MG tablet TAKE 1 TABLET (325 MG TOTAL) BY MOUTH DAILY.   fluticasone (FLONASE) 50 MCG/ACT nasal spray PLACE 2 SPRAYS INTO BOTH NOSTRILS DAILY.   Fremanezumab-vfrm 225 MG/1.5ML SOAJ INJECT 225 MG  INTO THE SKIN EVERY 30 (THIRTY) DAYS.   glucose blood (ACCU-CHEK GUIDE) test strip Use as instructed   meloxicam (MOBIC) 15 MG tablet Take 1 tablet (15 mg total) by mouth daily.   methylcellulose (CITRUCEL) oral powder Take as directed, daily   metoCLOPramide (REGLAN) 5 MG tablet Take 1 tablet (5 mg total) by mouth 3 (three) times daily before meals.   metoprolol succinate (TOPROL-XL) 100 MG 24 hr tablet Take 1 tablet (100 mg total) by mouth daily.   potassium chloride SA (KLOR-CON M) 20 MEQ tablet Take 1 tablet (20 mEq total) by mouth daily.   Rimegepant Sulfate 75 MG TBDP Take 1 tab at onset of migraine. May repeat in 2 hrs, if needed. Max dose: 2  tabs/day or 15/month. This is a 90 day rx.   rosuvastatin (CRESTOR) 5 MG tablet TAKE 1 TABLET (5 MG TOTAL) BY MOUTH AT BEDTIME.   Vitamin D, Ergocalciferol, (DRISDOL) 1.25 MG (50000 UNIT) CAPS capsule TAKE 1 CAPSULE  BY MOUTH EVERY 7 DAYS.   dicyclomine (BENTYL) 10 MG/5ML solution Take 5 mLs (10 mg total) by mouth 4 (four) times daily -  before meals and at bedtime.   docusate sodium (COLACE) 100 MG capsule Take 1 capsule (100 mg total) by mouth daily. (Patient not taking: Reported on 10/09/2021)   gabapentin (NEURONTIN) 300 MG capsule TAKE 1 CAPSULE BY MOUTH IN THE MORNING AND 2 CAPSULES IN THE EVENING.   ivabradine (CORLANOR) 5 MG TABS tablet Take 3 tablets by mouth two hours prior to cardiac CT scan.   levocetirizine (XYZAL) 5 MG tablet TAKE 1 TABLET (5 MG TOTAL) BY MOUTH EVERY EVENING.   metoprolol tartrate (LOPRESSOR) 50 MG tablet Take one tablet by mouth 2 hours prior to CT   Multiple Vitamin (MULTI VITAMIN DAILY PO) Take by mouth. (Patient not taking: Reported on 10/09/2021)   norgestimate-ethinyl estradiol (ORTHO-CYCLEN) 0.25-35 MG-MCG tablet TAKE 1 TABLET BY MOUTH DAILY. (Patient not taking: Reported on 10/09/2021)   nortriptyline (PAMELOR) 25 MG capsule Take 1 capsule (25 mg total) by mouth at bedtime.   ondansetron (ZOFRAN-ODT) 8 MG disintegrating tablet TAKE 1 TABLET BY MOUTH EVERY 8 HOURS AS NEEDED FOR NAUSEA OR VOMITING. (Patient not taking: Reported on 3/54/6568)   OneTouch Delica Lancets 12X MISC 1 application by Does not apply route in the morning, at noon, in the evening, and at bedtime.   oxyCODONE-acetaminophen (PERCOCET/ROXICET) 5-325 MG tablet Take 1 tablet by mouth every 6 (six) hours as needed for severe pain.   pantoprazole (PROTONIX) 40 MG tablet Take 1 tablet (40 mg total) by mouth 2 (two) times daily before a meal.   Plecanatide (TRULANCE) 3 MG TABS Take 3 mg by mouth daily.   Semaglutide,0.25 or 0.5MG/DOS, (OZEMPIC, 0.25 OR 0.5 MG/DOSE,) 2 MG/3ML SOPN Inject 0.5 mg into the  skin once a week.   sucralfate (CARAFATE) 1 g tablet Take 1 tablet (1 g total) by mouth every 6 (six) hours as needed. Slowly dissolve 1 tablet in 1 tablespoon of distilled water prior to ingestion.   tiZANidine (ZANAFLEX) 2 MG tablet Take 1 tablet (2 mg total) by mouth every 8 (eight) hours as needed for muscle spasms. Will need follow up for further refills.  12/29/2020 (Patient not taking: Reported on 10/09/2021)   [DISCONTINUED] glipiZIDE (GLUCOTROL) 10 MG tablet Take 1 tablet (10 mg total) by mouth 2 (two) times daily before a meal.   No facility-administered encounter medications on file as of 10/09/2021.     Review of Systems  Review of Systems  Constitutional: Negative.   HENT: Negative.    Cardiovascular: Negative.   Gastrointestinal: Negative.   Allergic/Immunologic: Negative.   Neurological: Negative.   Psychiatric/Behavioral: Negative.         Physical Exam  BP 103/69 (BP Location: Left Arm, Patient Position: Sitting, Cuff Size: Normal)   Pulse 83   Temp 98.1 F (36.7 C)   Ht _0  (1.473 m)   Wt 130 lb 9.6 oz (59.2 kg)   LMP 08/02/2021   SpO2 100%   BMI 27.30 kg/m   Wt Readings from Last 5 Encounters:  10/09/21 130 lb 9.6 oz (59.2 kg)  09/22/21 125 lb (56.7 kg)  09/02/21 122 lb (55.3 kg)  08/27/21 123 lb (55.8 kg)  08/19/21 125 lb 6.4 oz (56.9 kg)     Physical Exam Vitals and nursing note reviewed.  Constitutional:      General: She is not in acute distress.    Appearance: She is well-developed.  Cardiovascular:     Rate and Rhythm: Normal rate and regular rhythm.  Pulmonary:     Effort: Pulmonary effort is normal.     Breath sounds: Normal breath sounds.  Neurological:     Mental Status: She is alert and oriented to person, place, and time.      Lab Results:  CBC    Component Value Date/Time   WBC 6.5 08/19/2021 0933   WBC 5.6 08/30/2018 1240   RBC 4.31 08/19/2021 0933   RBC 4.47 08/30/2018 1240   HGB 13.1 08/19/2021 0933   HCT 38.7  08/19/2021 0933   PLT 358 08/19/2021 0933   MCV 90 08/19/2021 0933   MCH 30.4 08/19/2021 0933   MCH 26.8 08/30/2018 1240   MCHC 33.9 08/19/2021 0933   MCHC 31.7 08/30/2018 1240   RDW 12.1 08/19/2021 0933   LYMPHSABS 3.8 (H) 05/12/2021 1107   MONOABS 0.3 08/30/2018 1240   EOSABS 0.2 05/12/2021 1107   BASOSABS 0.1 05/12/2021 1107    BMET    Component Value Date/Time   NA 142 08/19/2021 0933   K 4.6 08/19/2021 0933   CL 105 08/19/2021 0933   CO2 24 08/19/2021 0933   GLUCOSE 92 08/19/2021 0933   GLUCOSE 185 (H) 08/30/2018 1240   GLUCOSE 77 09/21/2013 1230   BUN 13 08/19/2021 0933   CREATININE 0.63 08/19/2021 0933   CREATININE 0.58 09/14/2016 0939   CALCIUM 9.7 08/19/2021 0933   GFRNONAA 109 01/02/2020 1027   GFRNONAA >89 09/14/2016 0939   GFRAA 126 01/02/2020 1027   GFRAA >89 09/14/2016 0939    BNP No results found for: "BNP"  ProBNP No results found for: "PROBNP"  Imaging: No results found.   Assessment & Plan:   No problem-specific Assessment & Plan notes found for this encounter.     Fenton Foy, NP 10/09/2021

## 2021-10-09 NOTE — Patient Instructions (Signed)
1. Routine health maintenance  - Vitamin D, 25-hydroxy - hCG, serum, qualitative - CBC - Comprehensive metabolic panel - rosuvastatin (CRESTOR) 5 MG tablet; TAKE 1 TABLET (5 MG TOTAL) BY MOUTH AT BEDTIME.  Dispense: 30 tablet; Refill: 0  Follow up:  Follow up in 6 months or sooner if needed

## 2021-10-10 LAB — CBC
Hematocrit: 37.9 % (ref 34.0–46.6)
Hemoglobin: 13.1 g/dL (ref 11.1–15.9)
MCH: 30.9 pg (ref 26.6–33.0)
MCHC: 34.6 g/dL (ref 31.5–35.7)
MCV: 89 fL (ref 79–97)
Platelets: 324 10*3/uL (ref 150–450)
RBC: 4.24 x10E6/uL (ref 3.77–5.28)
RDW: 11.9 % (ref 11.7–15.4)
WBC: 7.6 10*3/uL (ref 3.4–10.8)

## 2021-10-10 LAB — COMPREHENSIVE METABOLIC PANEL
ALT: 12 IU/L (ref 0–32)
AST: 16 IU/L (ref 0–40)
Albumin/Globulin Ratio: 1.6 (ref 1.2–2.2)
Albumin: 4.6 g/dL (ref 3.8–4.8)
Alkaline Phosphatase: 65 IU/L (ref 44–121)
BUN/Creatinine Ratio: 31 — ABNORMAL HIGH (ref 9–23)
BUN: 17 mg/dL (ref 6–24)
Bilirubin Total: 0.3 mg/dL (ref 0.0–1.2)
CO2: 22 mmol/L (ref 20–29)
Calcium: 9.8 mg/dL (ref 8.7–10.2)
Chloride: 101 mmol/L (ref 96–106)
Creatinine, Ser: 0.54 mg/dL — ABNORMAL LOW (ref 0.57–1.00)
Globulin, Total: 2.8 g/dL (ref 1.5–4.5)
Glucose: 83 mg/dL (ref 70–99)
Potassium: 4.5 mmol/L (ref 3.5–5.2)
Sodium: 143 mmol/L (ref 134–144)
Total Protein: 7.4 g/dL (ref 6.0–8.5)
eGFR: 117 mL/min/{1.73_m2} (ref >59)

## 2021-10-10 LAB — HCG, SERUM, QUALITATIVE: hCG,Beta Subunit,Qual,Serum: NEGATIVE m[IU]/mL (ref <6)

## 2021-10-10 LAB — VITAMIN D 25 HYDROXY (VIT D DEFICIENCY, FRACTURES): Vit D, 25-Hydroxy: 22.7 ng/mL — ABNORMAL LOW (ref 30.0–100.0)

## 2021-10-14 ENCOUNTER — Telehealth: Payer: Self-pay | Admitting: *Deleted

## 2021-10-14 ENCOUNTER — Encounter: Payer: Self-pay | Admitting: Podiatry

## 2021-10-14 ENCOUNTER — Ambulatory Visit (INDEPENDENT_AMBULATORY_CARE_PROVIDER_SITE_OTHER): Payer: Medicaid Other | Admitting: Nurse Practitioner

## 2021-10-14 ENCOUNTER — Encounter (INDEPENDENT_AMBULATORY_CARE_PROVIDER_SITE_OTHER): Payer: Self-pay | Admitting: Nurse Practitioner

## 2021-10-14 VITALS — BP 103/72 | HR 92 | Temp 98.6°F | Ht <= 58 in | Wt 128.0 lb

## 2021-10-14 DIAGNOSIS — Z7985 Long-term (current) use of injectable non-insulin antidiabetic drugs: Secondary | ICD-10-CM | POA: Diagnosis not present

## 2021-10-14 DIAGNOSIS — E669 Obesity, unspecified: Secondary | ICD-10-CM | POA: Diagnosis not present

## 2021-10-14 DIAGNOSIS — E1140 Type 2 diabetes mellitus with diabetic neuropathy, unspecified: Secondary | ICD-10-CM | POA: Diagnosis not present

## 2021-10-14 DIAGNOSIS — E559 Vitamin D deficiency, unspecified: Secondary | ICD-10-CM | POA: Diagnosis not present

## 2021-10-14 DIAGNOSIS — Z6826 Body mass index (BMI) 26.0-26.9, adult: Secondary | ICD-10-CM

## 2021-10-14 MED ORDER — SEMAGLUTIDE (1 MG/DOSE) 4 MG/3ML ~~LOC~~ SOPN: 1.0000 mg | PEN_INJECTOR | SUBCUTANEOUS | 0 refills | Status: DC

## 2021-10-14 NOTE — Progress Notes (Signed)
Chief Complaint:   OBESITY Betty Cain is here to discuss her progress with her obesity treatment plan along with follow-up of her obesity related diagnoses. Betty Cain is on practicing portion control and making smarter food choices, such as increasing vegetables and decreasing simple carbohydrates and states she is following her eating plan approximately 20% of the time. Betty Cain states she is doing 0 minutes 0 times per week.  Today's visit was #: 25 Starting weight: 152 lbs Starting date: 01/10/2020 Today's weight: 128 lbs Today's date: 10/14/2021 Total lbs lost to date: 24 Total lbs lost since last in-office visit: 0  Interim History: Interpretor present: Betty Cain is struggling with cravings for sweets and sugar, and she notes increased hunger. She saw her Dentist since her last visit. She had one tooth pulled and 4 fillings. Her mouth pain has decreased and she is now eating regular foods. She has a follow up appointment with the Dentist on July 26th. She is not following the meal plan as direccted. Snacks: yogurt, fruit, and eggs. Drinking tea with milk, and non alcoholic beer, Pepsi, and water.  Subjective:   1. Type 2 diabetes mellitus with diabetic neuropathy, without long-term current use of insulin (HCC) Betty Cain is taking Ozempic 0.5 mg, and she reports some occasional side effects of constipation. She is struggling with hunger and cravings. Her blood sugars at home ranges between 93-122. She denies hypoglycemia.   2. Vitamin D deficiency Betty Cain is taking Vitamin D 50,000 IU weekly. She would like to stop due to containing pork. She plans to start OTC.   Assessment/Plan:   1. Type 2 diabetes mellitus with diabetic neuropathy, without long-term current use of insulin ([place]) Betty Cain agreed to increase Ozempic to 1 mg once weekly, with no refills.  Side effects were discussed.  We discussed the importance of following the meal plan to meet her calories and protein goals.  If she is not eating enough  calories or protein at her next visit I will consider decreasing her Ozempic back to 0.'5mg'$ .    - Semaglutide, 1 MG/DOSE, 4 MG/3ML SOPN; Inject 1 mg as directed once a week.  Dispense: 3 mL; Refill: 0  2. Vitamin D deficiency Betty Cain will take OTC vitamin D.  She has discussed with the pharmacist and found a vitamin D OTC that she is comfortable with taking.  3. Obesity: Current BMI 26.8 Betty Cain is currently in the action stage of change. As such, her goal is to continue with weight loss efforts. She has agreed to the Category 2 Plan.   Long discussion today about meeting her protein goals.   Exercise goals: No exercise has been prescribed at this time.  Behavioral modification strategies: increasing lean protein intake, increasing water intake, and planning for success.  Betty Cain has agreed to follow-up with our clinic in 3 weeks. She was informed of the importance of frequent follow-up visits to maximize her success with intensive lifestyle modifications for her multiple health conditions.   Objective:   Blood pressure 103/72, pulse 92, temperature 98.6 F (37 C), height '4\' 10"'$  (1.473 m), weight 128 lb (58.1 kg), SpO2 98 %. Body mass index is 26.75 kg/m.  General: Cooperative, alert, well developed, in no acute distress. HEENT: Conjunctivae and lids unremarkable. Cardiovascular: Regular rhythm.  Lungs: Normal work of breathing. Neurologic: No focal deficits.   Lab Results  Component Value Date   CREATININE 0.54 (L) 10/09/2021   BUN 17 10/09/2021   NA 143 10/09/2021   K 4.5 10/09/2021  CL 101 10/09/2021   CO2 22 10/09/2021   Lab Results  Component Value Date   ALT 12 10/09/2021   AST 16 10/09/2021   ALKPHOS 65 10/09/2021   BILITOT 0.3 10/09/2021   Lab Results  Component Value Date   HGBA1C 5.0 08/19/2021   HGBA1C 5.0 08/19/2021   HGBA1C 5.0 (A) 08/19/2021   HGBA1C 5.0 08/19/2021   HGBA1C 5.7 (A) 01/01/2021   Lab Results  Component Value Date   INSULIN 8.7 01/10/2020    Lab Results  Component Value Date   TSH 1.430 12/22/2020   Lab Results  Component Value Date   CHOL 120 05/19/2021   HDL 37 (L) 05/19/2021   LDLCALC 66 05/19/2021   TRIG 84 05/19/2021   CHOLHDL 3.2 05/19/2021   Lab Results  Component Value Date   VD25OH 22.7 (L) 10/09/2021   VD25OH 30.0 01/01/2021   VD25OH 42.6 09/29/2020   Lab Results  Component Value Date   WBC 7.6 10/09/2021   HGB 13.1 10/09/2021   HCT 37.9 10/09/2021   MCV 89 10/09/2021   PLT 324 10/09/2021   Lab Results  Component Value Date   IRON 83 05/19/2021   TIBC 230 (L) 05/19/2021   FERRITIN 478 (H) 05/19/2021   Attestation Statements:   Reviewed by clinician on day of visit: allergies, medications, problem list, medical history, surgical history, family history, social history, and previous encounter notes.   Wilhemena Durie, am acting as Location manager for [employer], FNP-C.  I have reviewed the above documentation for accuracy and completeness, and I agree with the above. Everardo Pacific, FNP

## 2021-10-14 NOTE — Telephone Encounter (Signed)
I called the patient and asked if it was her foot or leg that was hurting.  She said it was her foot.  I rescheduled her appointment from 11/02/2021 to 10/19/2021.

## 2021-10-19 ENCOUNTER — Ambulatory Visit: Payer: Medicaid Other | Admitting: Podiatry

## 2021-10-19 DIAGNOSIS — M7752 Other enthesopathy of left foot: Secondary | ICD-10-CM | POA: Diagnosis not present

## 2021-10-19 NOTE — Progress Notes (Signed)
HPI: 46 y.o. female PMHx diabetes mellitus controlled presenting today for follow-up evaluation of generalized bilateral foot pain now.  Patient initially had pain focused more to the third MTP of the left foot.  She says that now she is experiencing pain to the bilateral feet as well as her hands and her back.  She says the meloxicam helps minimally.  Past Medical History:  Diagnosis Date   Allergy    Anemia    Anxiety 01/2019   Asthma    B12 deficiency    Back pain    Common migraine with intractable migraine 06/28/2017   Constipation    Diabetes ([place]) 02/2019   Fatigue    GERD (gastroesophageal reflux disease)    pos H pylori   Hypertension    controlled with medication   IBS (irritable bowel syndrome) 2005   Joint pain    Neonatal death    Vaginal delivery, full term-lived x2 hours.    Palpitations    Shortness of breath    Shortness of breath on exertion    Spinal headache    Swelling of both lower extremities    Valvular heart disease    Vitamin D deficiency 02/2019    Past Surgical History:  Procedure Laterality Date   ADENOIDECTOMY     and tonsils (as a child)   BIOPSY  08/27/2021   Procedure: BIOPSY;  Surgeon: Yetta Flock, MD;  Location: [facility];  Service: Gastroenterology;;   boil  2003   right elbow   CESAREAN SECTION     x4   CESAREAN SECTION  06/02/2011   Procedure: CESAREAN SECTION;  Surgeon: [name], MD;  Location: [facility];  Service: Gynecology;  Laterality: N/A;  Primary Cesarean Section Delivery Baby Boy @ 0004, Apgars 9/9   CESAREAN SECTION N/A 12/10/2013   Procedure: REPEAT CESAREAN SECTION;  Surgeon: [name], MD;  Location: [facility];  Service: Obstetrics;  Laterality: N/A;   CESAREAN SECTION     colonoscopy  11/23/2018   stated had issues with sleep when in for procedure   ESOPHAGOGASTRODUODENOSCOPY (EGD) WITH PROPOFOL N/A 08/27/2021   Procedure: ESOPHAGOGASTRODUODENOSCOPY (EGD) WITH PROPOFOL;  Surgeon: Yetta Flock, MD;  Location: [facility];  Service: Gastroenterology;  Laterality: N/A;   OTHER SURGICAL HISTORY  2005   Uterine surgery    uterine cauterization      Allergies  Allergen Reactions   Topamax [Topiramate] Itching and Rash     Physical Exam: General: The patient is alert and oriented x3 in no acute distress.  Dermatology: Skin is warm, dry and supple bilateral lower extremities. Negative for open lesions or macerations.  Skin lesion to the plantar aspect of the left great toe has resolved  Vascular: Palpable pedal pulses bilaterally. Capillary refill within normal limits.  Negative for any significant edema or erythema  Neurological: Light touch and protective threshold grossly intact  Musculoskeletal Exam: No pedal deformities noted.  There continues to be some slight pain on palpation range of motion of the third MTP joint of the left foot as well as diffusely now throughout the entire arch and bilateral feet  Radiographic Exam LT foot 05/11/2021:  Normal osseous mineralization. Joint spaces preserved. No fracture/dislocation/boney destruction.    Assessment: 1.  Third MTP capsulitis left 2.  Symptomatic skin lesion plantar aspect of the left hallux possibly secondary to splinter foreign body; resolved 3.  Chronic generalized pain both upper and lower extremities as well as the back   Plan  of Care:  1. Patient evaluated.  2.  Today the patient explains that she has pain also in her hands and as well as her back.  Recommend follow-up with PCP for possible arthritic panel 3.  OTC power step insoles were dispensed.  Wear daily with good supportive shoes 4.  Continue meloxicam 15 mg daily 5.  Return to clinic after appointment with PCP  *From [place]      Elveria Lauderbaugh M. Tiahna Cure, DPM [facility]  Dr. Edrick Kins, DPM    [address], [place] [address]                Office [phone]  Fax [phone]

## 2021-10-23 ENCOUNTER — Other Ambulatory Visit: Payer: Self-pay

## 2021-11-02 ENCOUNTER — Ambulatory Visit: Payer: Medicaid Other | Admitting: Podiatry

## 2021-11-02 ENCOUNTER — Other Ambulatory Visit: Payer: Self-pay | Admitting: Nurse Practitioner

## 2021-11-02 DIAGNOSIS — G8929 Other chronic pain: Secondary | ICD-10-CM

## 2021-11-05 ENCOUNTER — Ambulatory Visit (INDEPENDENT_AMBULATORY_CARE_PROVIDER_SITE_OTHER): Payer: Medicaid Other | Admitting: Nurse Practitioner

## 2021-11-05 ENCOUNTER — Encounter (INDEPENDENT_AMBULATORY_CARE_PROVIDER_SITE_OTHER): Payer: Self-pay | Admitting: Nurse Practitioner

## 2021-11-05 ENCOUNTER — Other Ambulatory Visit: Payer: Self-pay | Admitting: Nurse Practitioner

## 2021-11-05 ENCOUNTER — Other Ambulatory Visit: Payer: Medicaid Other

## 2021-11-05 VITALS — BP 116/78 | HR 80 | Temp 98.9°F | Ht <= 58 in | Wt 128.0 lb

## 2021-11-05 DIAGNOSIS — Z6826 Body mass index (BMI) 26.0-26.9, adult: Secondary | ICD-10-CM | POA: Diagnosis not present

## 2021-11-05 DIAGNOSIS — E559 Vitamin D deficiency, unspecified: Secondary | ICD-10-CM | POA: Diagnosis not present

## 2021-11-05 DIAGNOSIS — Z7985 Long-term (current) use of injectable non-insulin antidiabetic drugs: Secondary | ICD-10-CM

## 2021-11-05 DIAGNOSIS — E1140 Type 2 diabetes mellitus with diabetic neuropathy, unspecified: Secondary | ICD-10-CM | POA: Diagnosis not present

## 2021-11-05 DIAGNOSIS — E669 Obesity, unspecified: Secondary | ICD-10-CM

## 2021-11-05 DIAGNOSIS — G8929 Other chronic pain: Secondary | ICD-10-CM

## 2021-11-05 MED ORDER — SEMAGLUTIDE (1 MG/DOSE) 4 MG/3ML ~~LOC~~ SOPN: 1.0000 mg | PEN_INJECTOR | SUBCUTANEOUS | 0 refills | Status: DC

## 2021-11-06 LAB — SEDIMENTATION RATE: Sed Rate: 33 mm/hr — ABNORMAL HIGH (ref 0–32)

## 2021-11-06 LAB — RHEUMATOID FACTOR: Rheumatoid fact SerPl-aCnc: <10 IU/mL (ref <14.0)

## 2021-11-10 NOTE — Progress Notes (Signed)
Chief Complaint:   OBESITY Betty Cain is here to discuss her progress with her obesity treatment plan along with follow-up of her obesity related diagnoses. Betty Cain is on the Category 2 Plan and states she is following her eating plan approximately 0% of the time. Betty Cain states she is using the treadmill 10 minutes 2-3 times per week.  Today's visit was #: 68 Starting weight: 152 lbs Starting date: 01/10/2020 Today's weight: 128 lbs Today's date: 11/05/2021 Total lbs lost to date: 24 lbs Total lbs lost since last in-office visit: 0  Interim History: Interpretor present. Betty Cain had 4 fillings done yesterday. She is not able to eat "regular foods". She is only able to drink tea today. She is afraid to eat currently due to procedure yesterday.   Subjective:   1. Vitamin D deficiency Betty Cain is currently taking Vit D3 1,000 IU over the counter. Her last Vit D level of 22.7. She does not want to take prescription Vit D due to containing pork.  2. Type 2 diabetes mellitus with diabetic neuropathy, without long-term current use of insulin ([place]) Betty Cain is currently taking Ozempic 1 mg. Questioning if increasing dose increased her heart rate. History of tachycardia for the past 5 years. She saw cardiology last on 02/25/21. Taking Toprol-XL 100 mg daily. Fasting blood sugars 82-105. Denies hypoglycemia.   Assessment/Plan:   1. Vitamin D deficiency Continue to take over the counter Vit D.  2. Type 2 diabetes mellitus with diabetic neuropathy, without long-term current use of insulin (HCC) Refill Ozempic 1 mg SubQ once weekly for 1 month with 0 refills. Notes she is eating better and does not want to decrease current Ozempic dose.  Reports having tachycardia for the past 5 + year. I recommended she schedule a follow up appointment with cardiology.  If worsens or persist to let me know or go to ER with any chest pain, shortness of breath, etc.   -Refill Semaglutide, 1 MG/DOSE, 4 MG/3ML SOPN; Inject 1 mg as  directed once a week.  Dispense: 3 mL; Refill: 0  3. Obesity: Current BMI 26.8 Betty Cain is currently in the action stage of change. As such, her goal is to continue with weight loss efforts. She has agreed to the Category 2 Plan.   Exercise goals: As is.  Behavioral modification strategies: increasing lean protein intake, increasing water intake, and planning for success.  Betty Cain has agreed to follow-up with our clinic in 4 weeks. She was informed of the importance of frequent follow-up visits to maximize her success with intensive lifestyle modifications for her multiple health conditions.   Objective:   Blood pressure 116/78, pulse 80, temperature 98.9 F (37.2 C), height '4\' 10"'$  (1.473 m), weight 128 lb (58.1 kg), SpO2 97 %. Body mass index is 26.75 kg/m.  General: Cooperative, alert, well developed, in no acute distress. HEENT: Conjunctivae and lids unremarkable. Cardiovascular: Regular rhythm.  Lungs: Normal work of breathing. Neurologic: No focal deficits.   Lab Results  Component Value Date   CREATININE 0.54 (L) 10/09/2021   BUN 17 10/09/2021   NA 143 10/09/2021   K 4.5 10/09/2021   CL 101 10/09/2021   CO2 22 10/09/2021   Lab Results  Component Value Date   ALT 12 10/09/2021   AST 16 10/09/2021   ALKPHOS 65 10/09/2021   BILITOT 0.3 10/09/2021   Lab Results  Component Value Date   HGBA1C 5.0 08/19/2021   HGBA1C 5.0 08/19/2021   HGBA1C 5.0 (A) 08/19/2021   HGBA1C  5.0 08/19/2021   HGBA1C 5.7 (A) 01/01/2021   Lab Results  Component Value Date   INSULIN 8.7 01/10/2020   Lab Results  Component Value Date   TSH 1.430 12/22/2020   Lab Results  Component Value Date   CHOL 120 05/19/2021   HDL 37 (L) 05/19/2021   LDLCALC 66 05/19/2021   TRIG 84 05/19/2021   CHOLHDL 3.2 05/19/2021   Lab Results  Component Value Date   VD25OH 22.7 (L) 10/09/2021   VD25OH 30.0 01/01/2021   VD25OH 42.6 09/29/2020   Lab Results  Component Value Date   WBC 7.6 10/09/2021   HGB  13.1 10/09/2021   HCT 37.9 10/09/2021   MCV 89 10/09/2021   PLT 324 10/09/2021   Lab Results  Component Value Date   IRON 83 05/19/2021   TIBC 230 (L) 05/19/2021   FERRITIN 478 (H) 05/19/2021   Attestation Statements:   Reviewed by clinician on day of visit: allergies, medications, problem list, medical history, surgical history, family history, social history, and previous encounter notes.  I, Brendell Tyus, RMA, am acting as transcriptionist for Everardo Pacific, FNP.  I have reviewed the above documentation for accuracy and completeness, and I agree with the above. Everardo Pacific, FNP

## 2021-11-12 ENCOUNTER — Other Ambulatory Visit: Payer: Self-pay | Admitting: Nurse Practitioner

## 2021-11-12 DIAGNOSIS — G8929 Other chronic pain: Secondary | ICD-10-CM

## 2021-11-13 ENCOUNTER — Other Ambulatory Visit: Payer: Self-pay | Admitting: Nurse Practitioner

## 2021-11-13 DIAGNOSIS — Z0000 Encounter for general adult medical examination without abnormal findings: Secondary | ICD-10-CM

## 2021-11-18 ENCOUNTER — Encounter (INDEPENDENT_AMBULATORY_CARE_PROVIDER_SITE_OTHER): Payer: Self-pay

## 2021-11-19 ENCOUNTER — Ambulatory Visit: Payer: Medicaid Other | Admitting: Nurse Practitioner

## 2021-11-23 ENCOUNTER — Telehealth: Payer: Self-pay

## 2021-11-23 DIAGNOSIS — Z0000 Encounter for general adult medical examination without abnormal findings: Secondary | ICD-10-CM

## 2021-11-23 MED ORDER — ROSUVASTATIN CALCIUM 5 MG PO TABS: ORAL_TABLET | Freq: Every day | ORAL | 0 refills | Status: DC

## 2021-11-23 NOTE — Telephone Encounter (Signed)
No additional notes

## 2021-11-24 ENCOUNTER — Encounter: Payer: Self-pay | Admitting: Internal Medicine

## 2021-11-24 ENCOUNTER — Ambulatory Visit: Payer: Medicaid Other | Admitting: Internal Medicine

## 2021-11-24 VITALS — BP 118/72 | HR 88 | Ht <= 58 in | Wt 129.6 lb

## 2021-11-24 DIAGNOSIS — R002 Palpitations: Secondary | ICD-10-CM | POA: Diagnosis not present

## 2021-11-24 NOTE — Patient Instructions (Addendum)
Medication Instructions:  Your physician has recommended you make the following change in your medication:   Stop Metoprolol '100mg'$   ** Begin Bisoprolol '5mg'$  - 1 tablet by mouth twice daily.  Begin Clonidine 0.'1mg'$  patches as directed.    Lab Work: None ordered.  If you have labs (blood work) drawn today and your tests are completely normal, you will receive your results only by: Betty Cain (if you have MyChart) OR A paper copy in the mail If you have any lab test that is abnormal or we need to change your treatment, we will call you to review the results.   Testing/Procedures: None ordered.    Follow-Up: At [facility], you and your health needs are our priority.  As part of our continuing mission to provide you with exceptional heart care, we have created designated Provider Care Teams.  These Care Teams include your primary Cardiologist (physician) and Advanced Practice Providers (APPs -  Physician Assistants and Nurse Practitioners) who all work together to provide you with the care you need, when you need it.  We recommend signing up for the patient portal called "MyChart".  Sign up information is provided on this After Visit Summary.  MyChart is used to connect with patients for Virtual Visits (Telemedicine).  Patients are able to view lab/test results, encounter notes, upcoming appointments, etc.  Non-urgent messages can be sent to your provider as well.   To learn more about what you can do with MyChart, go to [email or web address].    Your next appointment:   01/21/2022 at 345 - telephone visit with Dr Caryl Comes - we will call you about 15 minutes prior to your appointment.   Other Instructions Recommended salt supplementation.  The goal is about 2 g of sodium, not sodium chloride, a day.  Salt supplements include oral ThermaTabs, buffered sodium preparation, SaltStick Vitassium,  Other options include NUUN.  This comes in pill form and is dissolved in liquids.  Other  liquid preparations include liquid IV, Pedialyte advance care, TRI-oral Also discussed the role of compression wear including thigh sleeves and abdominal binders.  Calf compression is not specifically recommended..   Important Information About Sugar

## 2021-11-24 NOTE — Progress Notes (Signed)
ELECTROPHYSIOLOGY CONSULT NOTE  Patient ID: Betty Cain, MRN: [id number], DOB/AGE: 44-07-1975 44 y.o. Admit date: (Not on file) Date of Consult: 11/24/2021  Primary Physician: Tresa Garter, MD Primary Cardiologist: [facility]     Betty Cain is a 46 y.o. female who is being seen today for the evaluation of palpitaitons at the request of [facility].    Betty Cain is a 46 y.o. female referred for longstanding palpitations.  Particularly associated with exertion and accompanied by dyspnea.  Also has palpitations unassociated with exertion.  Some presyncope but no syncope.  Mild heat intolerance but no shower intolerance.  Has never noted problems with menses.  Cardiac evaluation in the past in addition to the studies below have included to treadmill tests both of which have been associated with a very rapid increase in heart rate up to 125 at minute 142 at minute 2 and 150 at the end of stage I   Event recorder 11/21 18% of the beats were faster than 100 bpm  Has diabetes with some foot pain question neuropathy.  Diabetes is of about 4 years known duration.  Takes metoclopramide for some years-- she thinks for palpitations  On further questioning--she says her palpitations are often all day long--but exertional   Not very active.  Some intermittent chest pain, typically brief.  Has some postprandial chest discomfort.  On PPI therapy  DATE TEST EF   6/15 Echo   65 %   1/20 Myoview  65% No ischemia  1/23 CTA  CaScore 0   Date Cr K Hgb  6/23 0.54 4.5 13.1               Past Medical History:  Diagnosis Date   Allergy    Anemia    Anxiety 01/2019   Asthma    B12 deficiency    Back pain    Common migraine with intractable migraine 06/28/2017   Constipation    Diabetes ([place]) 02/2019   Fatigue    GERD (gastroesophageal reflux disease)    pos H pylori   Hypertension    controlled with medication   IBS (irritable bowel syndrome) 2005   Joint pain    Neonatal death     Vaginal delivery, full term-lived x2 hours.    Palpitations    Shortness of breath    Shortness of breath on exertion    Spinal headache    Swelling of both lower extremities    Valvular heart disease    Vitamin D deficiency 02/2019      Surgical History:  Past Surgical History:  Procedure Laterality Date   ADENOIDECTOMY     and tonsils (as a child)   BIOPSY  08/27/2021   Procedure: BIOPSY;  Surgeon: Yetta Flock, MD;  Location: [facility];  Service: Gastroenterology;;   boil  2003   right elbow   CESAREAN SECTION     x4   CESAREAN SECTION  06/02/2011   Procedure: CESAREAN SECTION;  Surgeon: [name], MD;  Location: [facility];  Service: Gynecology;  Laterality: N/A;  Primary Cesarean Section Delivery Baby Boy @ 0004, Apgars 9/9   CESAREAN SECTION N/A 12/10/2013   Procedure: REPEAT CESAREAN SECTION;  Surgeon: [name], MD;  Location: [facility];  Service: Obstetrics;  Laterality: N/A;   CESAREAN SECTION     colonoscopy  11/23/2018   stated had issues with sleep when in for procedure   ESOPHAGOGASTRODUODENOSCOPY (EGD) WITH PROPOFOL N/A 08/27/2021   Procedure:  ESOPHAGOGASTRODUODENOSCOPY (EGD) WITH PROPOFOL;  Surgeon: Yetta Flock, MD;  Location: [facility];  Service: Gastroenterology;  Laterality: N/A;   OTHER SURGICAL HISTORY  2005   Uterine surgery    uterine cauterization       Home Meds: Current Meds  Medication Sig   acetaminophen (TYLENOL) 500 MG tablet Take 500 mg by mouth every 6 (six) hours as needed.   albuterol (VENTOLIN HFA) 108 (90 Base) MCG/ACT inhaler Inhale 2 puffs into the lungs every 6 (six) hours as needed for wheezing or shortness of breath.   blood glucose meter kit and supplies KIT Dispense based on patient and insurance preference. Use up to four times daily as directed.   Blood Pressure Monitoring DEVI 1 each by Does not apply route daily.   cetirizine (ZYRTEC) 10 MG tablet Take 1 tablet (10 mg total) by mouth daily.    dicyclomine (BENTYL) 10 MG/5ML solution Take 5 mLs (10 mg total) by mouth 4 (four) times daily -  before meals and at bedtime.   docusate sodium (COLACE) 100 MG capsule Take 1 capsule (100 mg total) by mouth daily.   ferrous sulfate (FEROSUL) 325 (65 FE) MG tablet TAKE 1 TABLET (325 MG TOTAL) BY MOUTH DAILY.   fluticasone (FLONASE) 50 MCG/ACT nasal spray PLACE 2 SPRAYS INTO BOTH NOSTRILS DAILY.   glucose blood (ACCU-CHEK GUIDE) test strip Use as instructed   ivabradine (CORLANOR) 5 MG TABS tablet Take 3 tablets by mouth two hours prior to cardiac CT scan.   levocetirizine (XYZAL) 5 MG tablet TAKE 1 TABLET (5 MG TOTAL) BY MOUTH EVERY EVENING.   meloxicam (MOBIC) 15 MG tablet Take 1 tablet (15 mg total) by mouth daily.   methylcellulose (CITRUCEL) oral powder Take as directed, daily   metoCLOPramide (REGLAN) 5 MG tablet Take 1 tablet (5 mg total) by mouth 3 (three) times daily before meals.   metoprolol succinate (TOPROL-XL) 100 MG 24 hr tablet Take 1 tablet (100 mg total) by mouth daily.   metoprolol tartrate (LOPRESSOR) 50 MG tablet Take one tablet by mouth 2 hours prior to CT   Multiple Vitamin (MULTI VITAMIN DAILY PO) Take by mouth.   ondansetron (ZOFRAN-ODT) 8 MG disintegrating tablet TAKE 1 TABLET BY MOUTH EVERY 8 HOURS AS NEEDED FOR NAUSEA OR VOMITING.   OneTouch Delica Lancets 29N MISC 1 application by Does not apply route in the morning, at noon, in the evening, and at bedtime.   oxyCODONE-acetaminophen (PERCOCET/ROXICET) 5-325 MG tablet Take 1 tablet by mouth every 6 (six) hours as needed for severe pain.   pantoprazole (PROTONIX) 40 MG tablet Take 1 tablet (40 mg total) by mouth 2 (two) times daily before a meal.   Plecanatide (TRULANCE) 3 MG TABS Take 3 mg by mouth daily.   potassium chloride SA (KLOR-CON M) 20 MEQ tablet Take 1 tablet (20 mEq total) by mouth daily.   Rimegepant Sulfate 75 MG TBDP Take 1 tab at onset of migraine. May repeat in 2 hrs, if needed. Max dose: 2 tabs/day or  15/month. This is a 90 day rx.   rosuvastatin (CRESTOR) 5 MG tablet TAKE 1 TABLET (5 MG TOTAL) BY MOUTH AT BEDTIME.   Semaglutide, 1 MG/DOSE, 4 MG/3ML SOPN Inject 1 mg as directed once a week.   sucralfate (CARAFATE) 1 g tablet Take 1 tablet (1 g total) by mouth every 6 (six) hours as needed. Slowly dissolve 1 tablet in 1 tablespoon of distilled water prior to ingestion.   tiZANidine (ZANAFLEX) 2 MG tablet  Take 1 tablet (2 mg total) by mouth every 8 (eight) hours as needed for muscle spasms. Will need follow up for further refills.  12/29/2020   [DISCONTINUED] glipiZIDE (GLUCOTROL) 10 MG tablet Take 1 tablet (10 mg total) by mouth 2 (two) times daily before a meal.   [DISCONTINUED] nortriptyline (PAMELOR) 25 MG capsule Take 1 capsule (25 mg total) by mouth at bedtime.    Allergies:  Allergies  Allergen Reactions   Topamax [Topiramate] Itching and Rash    Social History   Socioeconomic History   Marital status: Married    Spouse name: [name]   Number of children: 4   Years of education: Not on file   Highest education level: Not on file  Occupational History   Occupation: Unemployed  Tobacco Use   Smoking status: Never   Smokeless tobacco: Never  Vaping Use   Vaping Use: Never used  Substance and Sexual Activity   Alcohol use: No   Drug use: No   Sexual activity: Yes    Birth control/protection: None, Implant    Comment: pregnant  Other Topics Concern   Not on file  Social History Narrative   Lives with husband and child   Caffeine use: daily (tea), sometimes coffee/soda   Right handed    Social Determinants of Health   Financial Resource Strain: Not on file  Food Insecurity: No Food Insecurity (03/12/2020)   Hunger Vital Sign    Worried About Running Out of Food in the Last Year: Never true    Ran Out of Food in the Last Year: Never true  Transportation Needs: Not on file  Physical Activity: Not on file  Stress: Not on file  Social Connections: Not on file   Intimate Partner Violence: Not on file     Family History  Problem Relation Age of Onset   Diabetes Mother    Diabetes Father    Diabetes Sister    Anesthesia problems Neg Hx    Colon cancer Neg Hx    Esophageal cancer Neg Hx    Rectal cancer Neg Hx    Stomach cancer Neg Hx    Colon polyps Neg Hx    Migraines Neg Hx      ROS:  Please see the history of present illness.     All other systems reviewed and negative.    Physical Exam: Blood pressure 118/72, pulse 88, height 4' 10"  (1.473 m), weight 129 lb 9.6 oz (58.8 kg), last menstrual period 08/02/2021, SpO2 99 %. General: Well developed, well nourished female in no acute distress. Head: Normocephalic, atraumatic, sclera non-icteric, no xanthomas, nares are without discharge. EENT: normal  Lymph Nodes:  none Neck: Negative for carotid bruits. JVD not elevated. Back:without scoliosis kyphosis Lungs: Clear bilaterally to auscultation without wheezes, rales, or rhonchi. Breathing is unlabored. Heart: RRR with S1 S2. No  murmur . No rubs, or gallops appreciated. Abdomen: Soft, non-tender, non-distended with normoactive bowel sounds. No hepatomegaly. No rebound/guarding. No obvious abdominal masses. Msk:  Strength and tone appear normal for age. Extremities: No clubbing or cyanosis. No edema.  Distal pedal pulses are 2+ and equal bilaterally. Skin: Warm and Dry Neuro: Alert and oriented X 3. CN III-XII intact Grossly normal sensory and motor function . Psych:  Responds to questions appropriately with a normal affect.        EKG: sinus @ 88  14/09/35 Pwave monophasic upright V1 ( suspect superior placement )   11/22 ECG  biphasic V1  Assessment and Plan:  Palpitations  Exertional tachycardia  Diabetes ? Neuropathy  Chest pain  GERD   Some features suggest dysautonomia with very profound exercise associated tachycardia, but orthostatics are discordantly benign.  She is aware of her heart rate as she sits with  a rate of 80s-90s, and this is true not withstanding the fact that she is taking 100 mg of metoprolol.  We will try an alternative beta-blocker, I will start her on bisoprolol 5 mg twice daily.  I wonder in this regard whether there is any potential advantage of leaning on the work of [employer] years ago with syndrome X and the idea of enhancing filtering of cardiac signals to the brain as there seems to be a component of oversensitivity to a relatively normal heart rate.  This might include amitriptyline.  There is a note in the charts about using ivabradine; this may also be valuable.  Her event recorder from 2019 had only 18% of her heartbeats (only 18%) greater than 100 bpm; this was suggest that this is not inappropriate sinus tachycardia in a strict definition.  But in light of her tachycardic response to exertion, this may be part of her physiology.  Her diabetes may be further contributing.  I do not understand why she is on the metoclopramide, she says it because of palpitations; it is longstanding.  She also takes Zofran periodically.  She describes some numbness and pain on the balls of her feet, this may be a diabetic neuropathy although her hemoglobin A1c 5 years ago was normal.  We have discussed the physiology of dysautonomia and having encouraged her to increase her fluid intake, we will be more judicious with her salt intake as she had orthostatic hypertension today and has had exercise associated hypertension in the past.  Have suggested she use compression of abdomen and thighs.  We also highlighted the importance of psychological contributions to palpitations and awareness.  Later that night thought about the potential for clonidine as a central sympatholytic might be of augmenting benefit to her BB and if we see improvement would wean her BB   Virl Axe

## 2021-11-27 ENCOUNTER — Telehealth: Payer: Self-pay

## 2021-11-27 MED ORDER — CLONIDINE 0.1 MG/24HR TD PTWK: 0.1000 mg | MEDICATED_PATCH | TRANSDERMAL | 12 refills | Status: DC

## 2021-11-27 MED ORDER — BISOPROLOL FUMARATE 5 MG PO TABS
5.0000 mg | ORAL_TABLET | Freq: Two times a day (BID) | ORAL | 11 refills | Status: DC
Start: 2021-11-27 — End: 2021-12-08

## 2021-11-27 NOTE — Telephone Encounter (Signed)
Per verbal order of Dr Caryl Comes - Interpreter [phone] accessed.  Pt contacted and advised per Dr Caryl Comes stop Metoprolol Succinate '100mg'$ , begin Bisoprolol '5mg'$  - 1 tablet by mouth twice daily and Clonidine 0.'1mg'$  TTS as directed.  Pt verbalizes understanding of all instructions provided.    AVS updated and sent to pt via MyChart

## 2021-11-30 ENCOUNTER — Encounter (INDEPENDENT_AMBULATORY_CARE_PROVIDER_SITE_OTHER): Payer: Self-pay | Admitting: Nurse Practitioner

## 2021-11-30 ENCOUNTER — Ambulatory Visit (INDEPENDENT_AMBULATORY_CARE_PROVIDER_SITE_OTHER): Payer: Medicaid Other | Admitting: Nurse Practitioner

## 2021-11-30 VITALS — BP 107/75 | HR 87 | Temp 98.7°F | Ht <= 58 in | Wt 126.0 lb

## 2021-11-30 DIAGNOSIS — E1140 Type 2 diabetes mellitus with diabetic neuropathy, unspecified: Secondary | ICD-10-CM | POA: Diagnosis not present

## 2021-11-30 DIAGNOSIS — R002 Palpitations: Secondary | ICD-10-CM | POA: Diagnosis not present

## 2021-11-30 DIAGNOSIS — E669 Obesity, unspecified: Secondary | ICD-10-CM

## 2021-11-30 DIAGNOSIS — Z6826 Body mass index (BMI) 26.0-26.9, adult: Secondary | ICD-10-CM | POA: Diagnosis not present

## 2021-11-30 DIAGNOSIS — Z7985 Long-term (current) use of injectable non-insulin antidiabetic drugs: Secondary | ICD-10-CM

## 2021-11-30 MED ORDER — OZEMPIC (0.25 OR 0.5 MG/DOSE) 2 MG/3ML ~~LOC~~ SOPN: 0.5000 mg | PEN_INJECTOR | SUBCUTANEOUS | 0 refills | Status: DC

## 2021-12-08 DIAGNOSIS — E119 Type 2 diabetes mellitus without complications: Secondary | ICD-10-CM | POA: Diagnosis not present

## 2021-12-08 DIAGNOSIS — M2550 Pain in unspecified joint: Secondary | ICD-10-CM | POA: Diagnosis not present

## 2021-12-08 MED ORDER — BISOPROLOL FUMARATE 5 MG PO TABS: 5.0000 mg | ORAL_TABLET | Freq: Two times a day (BID) | ORAL | 11 refills | Status: DC

## 2021-12-08 MED ORDER — CLONIDINE 0.1 MG/24HR TD PTWK: 0.1000 mg | MEDICATED_PATCH | TRANSDERMAL | 12 refills | Status: DC

## 2021-12-08 NOTE — Progress Notes (Unsigned)
Chief Complaint:   OBESITY Betty Cain is here to discuss her progress with her obesity treatment plan along with follow-up of her obesity related diagnoses. Betty Cain is on the Category 2 Plan and states she is following her eating plan approximately 60% of the time. Betty Cain states she is walking on treadmill 15 minutes 1 times per week.  Today's visit was #: 55 Starting weight: 152 lbs Starting date: 01/10/2020 Today's weight: 126 lbs Today's date: 11/30/2021 Total lbs lost to date: 26 lbs Total lbs lost since last in-office visit: 2  Interim History: Betty Cain has overall done well with weight loss. Not skipping meals. Able to eat now. Notes her appetite has increased. Drinking Pesi/Coke, tea. Denies water. Drinking chocolate Fairlife daily.  Subjective:   1. Palpitations Saw cardio last on 11/24/21 and has follow up appointment scheduled on 01/21/22. Her Metoprolol was stopped and she was started on Bisoprolol.  2. Type 2 diabetes mellitus with diabetic neuropathy, without long-term current use of insulin ([place]) Betty Cain is currently taking Ozempic 1 mg. Fasting blood sugars are 80-90. Denies hypoglycemic. Occasional dizziness.  Assessment/Plan:   1. Palpitations Betty Cain will keep appointment with cardio.  2. Type 2 diabetes mellitus with diabetic neuropathy, without long-term current use of insulin (HCC) Decrease Ozempic to 0.5 mg SubQ once a week. Side effects discussed.   -Decrease/refill Semaglutide,0.25 or 0.'5MG'$ /DOS, (OZEMPIC, 0.25 OR 0.5 MG/DOSE,) 2 MG/3ML SOPN; Inject 0.5 mg into the skin once a week.  Dispense: 3 mL; Refill: 0  3. Obesity: Current BMI 26.5 Betty Cain is currently in the action stage of change. As such, her goal is to continue with weight loss efforts. She has agreed to the Category 2 Plan.   Exercise goals: As is.  We will obtain fasting labs at next visit. Start a protein shake.  Behavioral modification strategies: increasing lean protein intake, increasing water intake, and  planning for success.  Betty Cain has agreed to follow-up with our clinic in 3 weeks. She was informed of the importance of frequent follow-up visits to maximize her success with intensive lifestyle modifications for her multiple health conditions.   Objective:   Blood pressure 107/75, pulse 87, temperature 98.7 F (37.1 C), height '4\' 10"'$  (1.473 m), weight 126 lb (57.2 kg), last menstrual period 08/02/2021, SpO2 98 %. Body mass index is 26.33 kg/m.  General: Cooperative, alert, well developed, in no acute distress. HEENT: Conjunctivae and lids unremarkable. Cardiovascular: Regular rhythm.  Lungs: Normal work of breathing. Neurologic: No focal deficits.   Lab Results  Component Value Date   CREATININE 0.54 (L) 10/09/2021   BUN 17 10/09/2021   NA 143 10/09/2021   K 4.5 10/09/2021   CL 101 10/09/2021   CO2 22 10/09/2021   Lab Results  Component Value Date   ALT 12 10/09/2021   AST 16 10/09/2021   ALKPHOS 65 10/09/2021   BILITOT 0.3 10/09/2021   Lab Results  Component Value Date   HGBA1C 5.0 08/19/2021   HGBA1C 5.0 08/19/2021   HGBA1C 5.0 (A) 08/19/2021   HGBA1C 5.0 08/19/2021   HGBA1C 5.7 (A) 01/01/2021   Lab Results  Component Value Date   INSULIN 8.7 01/10/2020   Lab Results  Component Value Date   TSH 1.430 12/22/2020   Lab Results  Component Value Date   CHOL 120 05/19/2021   HDL 37 (L) 05/19/2021   LDLCALC 66 05/19/2021   TRIG 84 05/19/2021   CHOLHDL 3.2 05/19/2021   Lab Results  Component Value Date  VD25OH 22.7 (L) 10/09/2021   VD25OH 30.0 01/01/2021   VD25OH 42.6 09/29/2020   Lab Results  Component Value Date   WBC 7.6 10/09/2021   HGB 13.1 10/09/2021   HCT 37.9 10/09/2021   MCV 89 10/09/2021   PLT 324 10/09/2021   Lab Results  Component Value Date   IRON 83 05/19/2021   TIBC 230 (L) 05/19/2021   FERRITIN 478 (H) 05/19/2021   Attestation Statements:   Reviewed by clinician on day of visit: allergies, medications, problem list, medical  history, surgical history, family history, social history, and previous encounter notes.  I, Brendell Tyus, RMA, am acting as transcriptionist for Everardo Pacific, FNP.  I have reviewed the above documentation for accuracy and completeness, and I agree with the above. Everardo Pacific, FNP

## 2021-12-11 ENCOUNTER — Telehealth: Payer: Self-pay

## 2021-12-11 NOTE — Telephone Encounter (Signed)
**Note De-Identified Betty Cain Obfuscation** [name] - PA Case ID: [id number] - Rx #: [id number] Outcome: Approved today Coverage Starts on: 12/11/2021 12:00:00 AM, Coverage Ends on: 12/11/2022 12:00:00 AM. Drug Bisoprolol Fumarate '5MG'$  tablets Form [facility] Electronic [place] Form [phone] NCPDP)  [facility] [place] - [address] (Ph: [phone]) of this approval.

## 2021-12-15 MED ORDER — ATENOLOL 50 MG PO TABS: 50.0000 mg | ORAL_TABLET | Freq: Two times a day (BID) | ORAL | 11 refills | Status: DC

## 2021-12-15 MED ORDER — CLONIDINE HCL 0.1 MG PO TABS: 0.1000 mg | ORAL_TABLET | Freq: Two times a day (BID) | ORAL | 11 refills | Status: DC

## 2021-12-16 MED ORDER — BISOPROLOL FUMARATE 5 MG PO TABS: 5.0000 mg | ORAL_TABLET | Freq: Two times a day (BID) | ORAL | 3 refills | Status: DC

## 2021-12-16 NOTE — Addendum Note (Signed)
Addended by: Thora Lance on: 12/16/2021 09:51 AM   Modules accepted: Orders

## 2021-12-24 ENCOUNTER — Other Ambulatory Visit: Payer: Self-pay | Admitting: Neurology

## 2021-12-28 ENCOUNTER — Encounter (INDEPENDENT_AMBULATORY_CARE_PROVIDER_SITE_OTHER): Payer: Self-pay | Admitting: Nurse Practitioner

## 2021-12-28 ENCOUNTER — Ambulatory Visit (INDEPENDENT_AMBULATORY_CARE_PROVIDER_SITE_OTHER): Payer: Medicaid Other | Admitting: Nurse Practitioner

## 2021-12-28 VITALS — BP 105/69 | HR 88 | Temp 98.8°F | Ht <= 58 in | Wt 124.0 lb

## 2021-12-28 DIAGNOSIS — E559 Vitamin D deficiency, unspecified: Secondary | ICD-10-CM

## 2021-12-28 DIAGNOSIS — Z7985 Long-term (current) use of injectable non-insulin antidiabetic drugs: Secondary | ICD-10-CM | POA: Diagnosis not present

## 2021-12-28 DIAGNOSIS — E1140 Type 2 diabetes mellitus with diabetic neuropathy, unspecified: Secondary | ICD-10-CM

## 2021-12-28 DIAGNOSIS — Z6831 Body mass index (BMI) 31.0-31.9, adult: Secondary | ICD-10-CM | POA: Diagnosis not present

## 2021-12-28 DIAGNOSIS — Z6826 Body mass index (BMI) 26.0-26.9, adult: Secondary | ICD-10-CM

## 2021-12-28 DIAGNOSIS — E669 Obesity, unspecified: Secondary | ICD-10-CM

## 2021-12-28 MED ORDER — OZEMPIC (0.25 OR 0.5 MG/DOSE) 2 MG/3ML ~~LOC~~ SOPN: 0.5000 mg | PEN_INJECTOR | SUBCUTANEOUS | 0 refills | Status: DC

## 2021-12-29 ENCOUNTER — Other Ambulatory Visit: Payer: Self-pay

## 2021-12-29 LAB — HEMOGLOBIN A1C
Est. average glucose Bld gHb Est-mCnc: 94 mg/dL
Hgb A1c MFr Bld: 4.9 % (ref 4.8–5.6)

## 2021-12-29 LAB — COMPREHENSIVE METABOLIC PANEL
ALT: 14 IU/L (ref 0–32)
AST: 18 IU/L (ref 0–40)
Albumin/Globulin Ratio: 1.8 (ref 1.2–2.2)
Albumin: 4.7 g/dL (ref 3.9–4.9)
Alkaline Phosphatase: 63 IU/L (ref 44–121)
BUN/Creatinine Ratio: 21 (ref 9–23)
BUN: 13 mg/dL (ref 6–24)
Bilirubin Total: 0.7 mg/dL (ref 0.0–1.2)
CO2: 21 mmol/L (ref 20–29)
Calcium: 9.4 mg/dL (ref 8.7–10.2)
Chloride: 105 mmol/L (ref 96–106)
Creatinine, Ser: 0.63 mg/dL (ref 0.57–1.00)
Globulin, Total: 2.6 g/dL (ref 1.5–4.5)
Glucose: 74 mg/dL (ref 70–99)
Potassium: 4.5 mmol/L (ref 3.5–5.2)
Sodium: 142 mmol/L (ref 134–144)
Total Protein: 7.3 g/dL (ref 6.0–8.5)
eGFR: 112 mL/min/{1.73_m2} (ref >59)

## 2021-12-29 LAB — INSULIN, RANDOM: INSULIN: 3.8 u[IU]/mL (ref 2.6–24.9)

## 2021-12-29 LAB — VITAMIN D 25 HYDROXY (VIT D DEFICIENCY, FRACTURES): Vit D, 25-Hydroxy: 35.6 ng/mL (ref 30.0–100.0)

## 2021-12-29 LAB — TSH: TSH: 2.53 u[IU]/mL (ref 0.450–4.500)

## 2021-12-29 MED ORDER — AJOVY 225 MG/1.5ML ~~LOC~~ SOAJ: 225.0000 mg | SUBCUTANEOUS | 2 refills | Status: DC

## 2021-12-30 NOTE — Progress Notes (Signed)
Chief Complaint:   OBESITY Betty Cain is here to discuss her progress with her obesity treatment plan along with follow-up of her obesity related diagnoses. Betty Cain is on the Category 2 Plan and states she is following her eating plan approximately 60% of the time. Betty Cain states she is walking 15 minutes 2-3 times per week.  Today's visit was #: 28 Starting weight: 152 lbs Starting date: 01/10/2020 Today's weight: 124 lbs Today's date: 12/28/2021 Total lbs lost to date: 28 lbs Total lbs lost since last in-office visit: 2  Interim History: Interpreter present. Betty Cain has overall done well with weight loss. Eating 2-3 meals per day. Started drinking Fairlife protein shake daily. She is also drinking tea with milk +cream, water and juice.  Subjective:   1. Type 2 diabetes mellitus with diabetic neuropathy, without long-term current use of insulin (HCC) Betty Cain is taking Ozempic 0.5 mg. Denies any side effects. Fasting blood sugars are 89. Denies hypoglycemia. Last eye exam: 2021. On a statin.  2. Vitamin D deficiency Betty Cain is taking over the counter Vit D. She does not want to take capsule due to containing gelatin.  Assessment/Plan:   1. Type 2 diabetes mellitus with diabetic neuropathy, without long-term current use of insulin (HCC) We will obtain labs today. We will refill Ozempic 0.5 mg once a week for 1 month with 0 refills. Daje is to call and schedule diabetic eye exam.  Good blood sugar control is important to decrease the likelihood of diabetic complications such as nephropathy, neuropathy, limb loss, blindness, coronary artery disease, and death. Intensive lifestyle modification including diet, exercise and weight loss are the first line of treatment for diabetes.    -Refill Semaglutide,0.25 or 0.'5MG'$ /DOS, (OZEMPIC, 0.25 OR 0.5 MG/DOSE,) 2 MG/3ML SOPN; Inject 0.5 mg into the skin once a week.  Dispense: 3 mL; Refill: 0  - Comprehensive metabolic panel - Hemoglobin A1c - Insulin,  random - TSH  2. Vitamin D deficiency We will obtain labs today. Asked to bring a picture of Vit D she is currently taking at next visit or send via mychart  - Comprehensive metabolic panel - VITAMIN D 25 Hydroxy (Vit-D Deficiency, Fractures) - TSH  3. Obesity: Current BMI 26.0 Betty Cain is currently in the action stage of change. As such, her goal is to continue with weight loss efforts. She has agreed to the Category 2 Plan.   Exercise goals: As is.  Behavioral modification strategies: increasing lean protein intake, increasing vegetables, increasing water intake, and planning for success.  Betty Cain has agreed to follow-up with our clinic in 4 weeks. She was informed of the importance of frequent follow-up visits to maximize her success with intensive lifestyle modifications for her multiple health conditions.   Betty Cain was informed we would discuss her lab results at her next visit unless there is a critical issue that needs to be addressed sooner. Betty Cain agreed to keep her next visit at the agreed upon time to discuss these results.  Objective:   Blood pressure 105/69, pulse 88, temperature 98.8 F (37.1 C), height '4\' 10"'$  (1.473 m), weight 124 lb (56.2 kg), SpO2 99 %. Body mass index is 25.92 kg/m.  General: Cooperative, alert, well developed, in no acute distress. HEENT: Conjunctivae and lids unremarkable. Cardiovascular: Regular rhythm.  Lungs: Normal work of breathing. Neurologic: No focal deficits.   Lab Results  Component Value Date   CREATININE 0.63 12/28/2021   BUN 13 12/28/2021   NA 142 12/28/2021   K 4.5 12/28/2021  CL 105 12/28/2021   CO2 21 12/28/2021   Lab Results  Component Value Date   ALT 14 12/28/2021   AST 18 12/28/2021   ALKPHOS 63 12/28/2021   BILITOT 0.7 12/28/2021   Lab Results  Component Value Date   HGBA1C 4.9 12/28/2021   HGBA1C 5.0 08/19/2021   HGBA1C 5.0 08/19/2021   HGBA1C 5.0 (A) 08/19/2021   HGBA1C 5.0 08/19/2021   Lab Results  Component  Value Date   INSULIN 3.8 12/28/2021   INSULIN 8.7 01/10/2020   Lab Results  Component Value Date   TSH 2.530 12/28/2021   Lab Results  Component Value Date   CHOL 120 05/19/2021   HDL 37 (L) 05/19/2021   LDLCALC 66 05/19/2021   TRIG 84 05/19/2021   CHOLHDL 3.2 05/19/2021   Lab Results  Component Value Date   VD25OH 35.6 12/28/2021   VD25OH 22.7 (L) 10/09/2021   VD25OH 30.0 01/01/2021   Lab Results  Component Value Date   WBC 7.6 10/09/2021   HGB 13.1 10/09/2021   HCT 37.9 10/09/2021   MCV 89 10/09/2021   PLT 324 10/09/2021   Lab Results  Component Value Date   IRON 83 05/19/2021   TIBC 230 (L) 05/19/2021   FERRITIN 478 (H) 05/19/2021   Attestation Statements:   Reviewed by clinician on day of visit: allergies, medications, problem list, medical history, surgical history, family history, social history, and previous encounter notes.  I, Brendell Tyus, RMA, am acting as transcriptionist for Betty Pacific, FNP.  I have reviewed the above documentation for accuracy and completeness, and I agree with the above. Betty Pacific, FNP

## 2022-01-21 ENCOUNTER — Ambulatory Visit: Payer: Medicaid Other | Attending: Internal Medicine | Admitting: Internal Medicine

## 2022-01-21 ENCOUNTER — Encounter: Payer: Self-pay | Admitting: Internal Medicine

## 2022-01-21 VITALS — BP 87/68 | HR 97 | Ht <= 58 in | Wt 126.0 lb

## 2022-01-21 DIAGNOSIS — R002 Palpitations: Secondary | ICD-10-CM | POA: Diagnosis not present

## 2022-01-21 DIAGNOSIS — R000 Tachycardia, unspecified: Secondary | ICD-10-CM | POA: Diagnosis not present

## 2022-01-21 NOTE — Progress Notes (Signed)
Electrophysiology TeleHealth Note      Date:  01/21/2022   ID:  Omunique, Pederson 30-Jan-1976, MRN [id number]  Location: patient's home  Provider location: [address] [place]  Evaluation Performed: Follow-up visit  PCP:  Tresa Garter, MD  Cardiologist:    Electrophysiologist:  SK   Chief Complaint:    History of Present Illness:    Betty Cain is a 46 y.o. female who presents via audio/video conferencing for a telehealth visit today.  Since last being seen in our clinic for palpitations associated with tachycardia in the context of normal orthostatics in the context of longstanding diabetes at the last visit and this heart rate not withstanding the 100 mg of metoprolol.  We elected to try her on bisoprolol 5 twice daily and clonidine 0.1 twice daily.  The patient reports some improvement with her being sleepy, but no improvement with palpitations.  Still with ambulatory palpitations Compression-abdominal and thigh has helped 1-2hrs/at at time.  The thigh component is most uncomfortable. Also complaining of cold spells.  This is new with new medications. Able to do her ADLs, she does not have a choice she just does them.  The patient denies symptoms of fevers, chills, cough, or new SOB worrisome for COVID 19.   Past Medical History:  Diagnosis Date   Allergy    Anemia    Anxiety 01/2019   Asthma    B12 deficiency    Back pain    Common migraine with intractable migraine 06/28/2017   Constipation    Diabetes ([place]) 02/2019   Fatigue    GERD (gastroesophageal reflux disease)    pos H pylori   Hypertension    controlled with medication   IBS (irritable bowel syndrome) 2005   Joint pain    Neonatal death    Vaginal delivery, full term-lived x2 hours.    Palpitations    Shortness of breath    Shortness of breath on exertion    Spinal headache    Swelling of both lower extremities    Valvular heart disease    Vitamin D deficiency 02/2019     Past Surgical History:  Procedure Laterality Date   ADENOIDECTOMY     and tonsils (as a child)   BIOPSY  08/27/2021   Procedure: BIOPSY;  Surgeon: Yetta Flock, MD;  Location: [facility];  Service: Gastroenterology;;   boil  2003   right elbow   CESAREAN SECTION     x4   CESAREAN SECTION  06/02/2011   Procedure: CESAREAN SECTION;  Surgeon: [name], MD;  Location: [facility];  Service: Gynecology;  Laterality: N/A;  Primary Cesarean Section Delivery Baby Boy @ 0004, Apgars 9/9   CESAREAN SECTION N/A 12/10/2013   Procedure: REPEAT CESAREAN SECTION;  Surgeon: [name], MD;  Location: [facility];  Service: Obstetrics;  Laterality: N/A;   CESAREAN SECTION     colonoscopy  11/23/2018   stated had issues with sleep when in for procedure   ESOPHAGOGASTRODUODENOSCOPY (EGD) WITH PROPOFOL N/A 08/27/2021   Procedure: ESOPHAGOGASTRODUODENOSCOPY (EGD) WITH PROPOFOL;  Surgeon: Yetta Flock, MD;  Location: [facility];  Service: Gastroenterology;  Laterality: N/A;   OTHER SURGICAL HISTORY  2005   Uterine surgery    uterine cauterization      Current Outpatient Medications  Medication Sig Dispense Refill   acetaminophen (TYLENOL) 500 MG tablet Take 500 mg by mouth every 6 (six) hours as needed.     albuterol (  VENTOLIN HFA) 108 (90 Base) MCG/ACT inhaler Inhale 2 puffs into the lungs every 6 (six) hours as needed for wheezing or shortness of breath. 18 g 2   bisoprolol (ZEBETA) 5 MG tablet Take 1 tablet (5 mg total) by mouth 2 (two) times daily. 180 tablet 3   blood glucose meter kit and supplies KIT Dispense based on patient and insurance preference. Use up to four times daily as directed. 1 each 0   Blood Pressure Monitoring DEVI 1 each by Does not apply route daily. 1 Device 0   cetirizine (ZYRTEC) 10 MG tablet Take 1 tablet (10 mg total) by mouth daily. 30 tablet 11   cloNIDine (CATAPRES) 0.1 MG tablet Take 1 tablet (0.1 mg total) by mouth 2 (two) times daily. 60 tablet 11    ferrous sulfate (FEROSUL) 325 (65 FE) MG tablet TAKE 1 TABLET (325 MG TOTAL) BY MOUTH DAILY. 90 tablet 3   fluticasone (FLONASE) 50 MCG/ACT nasal spray PLACE 2 SPRAYS INTO BOTH NOSTRILS DAILY. 16 g 11   Fremanezumab-vfrm (AJOVY) 225 MG/1.5ML SOAJ Inject 225 mg into the skin every 30 (thirty) days. 1.68 mL 2   glucose blood (ACCU-CHEK GUIDE) test strip Use as instructed 100 each 12   ivabradine (CORLANOR) 5 MG TABS tablet Take 3 tablets by mouth two hours prior to cardiac CT scan. 3 tablet 0   levocetirizine (XYZAL) 5 MG tablet TAKE 1 TABLET (5 MG TOTAL) BY MOUTH EVERY EVENING. 90 tablet 3   meloxicam (MOBIC) 15 MG tablet Take 1 tablet (15 mg total) by mouth daily. 30 tablet 1   methylcellulose (CITRUCEL) oral powder Take as directed, daily     metoCLOPramide (REGLAN) 5 MG tablet Take 1 tablet (5 mg total) by mouth 3 (three) times daily before meals. 90 tablet 3   Multiple Vitamin (MULTI VITAMIN DAILY PO) Take by mouth.     ondansetron (ZOFRAN-ODT) 8 MG disintegrating tablet TAKE 1 TABLET BY MOUTH EVERY 8 HOURS AS NEEDED FOR NAUSEA OR VOMITING. 30 tablet 0   OneTouch Delica Lancets 32G MISC 1 application by Does not apply route in the morning, at noon, in the evening, and at bedtime. 100 each 11   oxyCODONE-acetaminophen (PERCOCET/ROXICET) 5-325 MG tablet Take 1 tablet by mouth every 6 (six) hours as needed for severe pain. 10 tablet 0   pantoprazole (PROTONIX) 40 MG tablet Take 1 tablet (40 mg total) by mouth 2 (two) times daily before a meal. 60 tablet 3   Plecanatide (TRULANCE) 3 MG TABS Take 3 mg by mouth daily. 30 tablet 3   Rimegepant Sulfate 75 MG TBDP Take 1 tab at onset of migraine. May repeat in 2 hrs, if needed. Max dose: 2 tabs/day or 15/month. This is a 90 day rx. 45 tablet 3   rosuvastatin (CRESTOR) 5 MG tablet TAKE 1 TABLET (5 MG TOTAL) BY MOUTH AT BEDTIME. 30 tablet 0   Semaglutide,0.25 or 0.5MG/DOS, (OZEMPIC, 0.25 OR 0.5 MG/DOSE,) 2 MG/3ML SOPN Inject 0.5 mg into the skin once a  week. 3 mL 0   sucralfate (CARAFATE) 1 g tablet Take 1 tablet (1 g total) by mouth every 6 (six) hours as needed. Slowly dissolve 1 tablet in 1 tablespoon of distilled water prior to ingestion. 60 tablet 2   dicyclomine (BENTYL) 10 MG/5ML solution Take 5 mLs (10 mg total) by mouth 4 (four) times daily -  before meals and at bedtime. 600 mL 0   potassium chloride SA (KLOR-CON M) 20 MEQ tablet Take 1 tablet (  20 mEq total) by mouth daily. 30 tablet 11   No current facility-administered medications for this visit.       Exam:    Vital Signs:  BP (!) 87/68   Pulse 97     Well appearing, alert and conversant, regular work of breathing,  good skin color Eyes- anicteric, neuro- grossly intact, skin- no apparent rash or lesions or cyanosis, mouth- oral mucosa is pink   Labs/Other Tests and Data Reviewed:    Recent Labs: 10/09/2021: Hemoglobin 13.1; Platelets 324 12/28/2021: ALT 14; BUN 13; Creatinine, Ser 0.63; Potassium 4.5; Sodium 142; TSH 2.530   Wt Readings from Last 3 Encounters:  12/28/21 124 lb (56.2 kg)  11/30/21 126 lb (57.2 kg)  11/24/21 129 lb 9.6 oz (58.8 kg)     Other studies personally reviewed: Additional studies/ records that were reviewed today include:      ASSESSMENT & PLAN:    Palpitations  Hypotension ?iatrogenic   Exertional tachycardia   Diabetes ? Neuropathy   Chest pain   GERD       Follow-up:  4 week BP--will decrease bisoprolol from bid >>qd;  if 2 weeks BP still < 100 will stop all together. And continue clonidine,  her cold spells may also be 2/2 low BP Will have her use thigh sleeves instead of one piece as sje is not tolerating the abdominal portion     Current medicines are reviewed at length with the patient today.   The patient  concerns regarding her medicines.  The following changes were made today:  (As above)   Labs/ tests ordered today include:   No orders of the defined types were placed in this encounter.     Today, I  have spent 28 minutes with the patient with telehealth technology discussing the above.  Signed, Virl Axe, MD  01/21/2022 5:23 PM     [facility] [address] [place] [address] [phone] (office) [phone] (fax) : Good night

## 2022-01-25 ENCOUNTER — Encounter (INDEPENDENT_AMBULATORY_CARE_PROVIDER_SITE_OTHER): Payer: Self-pay | Admitting: Adult Health

## 2022-01-25 ENCOUNTER — Ambulatory Visit (INDEPENDENT_AMBULATORY_CARE_PROVIDER_SITE_OTHER): Payer: Medicaid Other | Admitting: Adult Health

## 2022-01-25 VITALS — BP 110/68 | HR 75 | Temp 97.9°F | Ht <= 58 in | Wt 123.0 lb

## 2022-01-25 DIAGNOSIS — Z7985 Long-term (current) use of injectable non-insulin antidiabetic drugs: Secondary | ICD-10-CM

## 2022-01-25 DIAGNOSIS — Z6825 Body mass index (BMI) 25.0-25.9, adult: Secondary | ICD-10-CM | POA: Diagnosis not present

## 2022-01-25 DIAGNOSIS — E669 Obesity, unspecified: Secondary | ICD-10-CM

## 2022-01-25 DIAGNOSIS — G8929 Other chronic pain: Secondary | ICD-10-CM

## 2022-01-25 DIAGNOSIS — E559 Vitamin D deficiency, unspecified: Secondary | ICD-10-CM

## 2022-01-25 DIAGNOSIS — E1140 Type 2 diabetes mellitus with diabetic neuropathy, unspecified: Secondary | ICD-10-CM

## 2022-01-25 MED ORDER — OZEMPIC (0.25 OR 0.5 MG/DOSE) 2 MG/3ML ~~LOC~~ SOPN: 0.5000 mg | PEN_INJECTOR | SUBCUTANEOUS | 0 refills | Status: DC

## 2022-02-01 MED ORDER — WEGOVY 0.5 MG/0.5ML ~~LOC~~ SOAJ: 0.5000 mg | SUBCUTANEOUS | 0 refills | Status: DC

## 2022-02-01 NOTE — Progress Notes (Signed)
Chief Complaint:   OBESITY Betty Cain is here to discuss her progress with her obesity treatment plan along with follow-up of her obesity related diagnoses. Betty Cain is on the Category 2 Plan and states she is following her eating plan approximately 50% of the time. Betty Cain states she is not exercising due to pain in her bones.   Today's visit was #: 50 Starting weight: 152 lbs Starting date: 01/10/2020 Today's weight: 123 lbs Today's date: 01/25/2022 Total lbs lost to date: 29 lbs Total lbs lost since last in-office visit: 1 lb  Interim History:  Betty Cain reports an increase in diffuse pain in the last 3 months.  11/12/21 PCP referred her to Rheumatologist- referral declined. PDMP reviewed- 09/18/21-Oxycodone-Acetaminophen 5-325 refilled, count 10.  Pain has prevented her from participating in regular exercise.  Of note:  Interpretor present at bedside during office visit.  This patient is new to me today.  Subjective:   1. Other chronic pain 11/12/21-PCP referred patient to Rheumatologist for evaluation of chronic pain. Per Epic review Rheumatology declined referral.  2. Vitamin D deficiency 09/  /2023, Vitamin D level.  She is currently taking OTC Vitamin D-3 1,000 IU daily.   3. Type 2 diabetes mellitus with diabetic neuropathy, without long-term current use of insulin (HCC) Lab Results  Component Value Date   HGBA1C 4.9 12/28/2021   HGBA1C 5.0 08/19/2021   HGBA1C 5.0 08/19/2021   HGBA1C 5.0 (A) 08/19/2021   HGBA1C 5.0 08/19/2021  Only antidiabetic medication- Ozempic 0.5 mg once weekly injection. Denies sx's of hypoglycemia. She denies mass in neck, dysphagia, dyspepsia, persistent hoarseness, abdominal pain, or N/V/Constipation.  Assessment/Plan:   1. Other chronic pain Follow up with PCP and inquire about new referral to Pain Management.  2. Vitamin D deficiency Increase Vitamin D-3 to 2,000 IU daily.  3. Type 2 diabetes mellitus with diabetic neuropathy, without  long-term current use of insulin (HCC) Refill - Semaglutide,0.25 or 0.'5MG'$ /DOS, (OZEMPIC, 0.25 OR 0.5 MG/DOSE,) 2 MG/3ML SOPN; Inject 0.5 mg into the skin once a week.  Dispense: 3 mL; Refill: 0  4. Obesity: Current BMI 25.8 Refill Wegovy 0.5 mg once weekly, dispense 2 mL no refills.   Betty Cain is currently in the action stage of change. As such, her goal is to continue with weight loss efforts. She has agreed to the Category 2 Plan.   Exercise goals:  As is.   Behavioral modification strategies: increasing lean protein intake, decreasing simple carbohydrates, meal planning and cooking strategies, keeping healthy foods in the home, and planning for success.  Betty Cain has agreed to follow-up with our clinic in 4 weeks. She was informed of the importance of frequent follow-up visits to maximize her success with intensive lifestyle modifications for her multiple health conditions.   Objective:   Blood pressure 110/68, pulse 75, temperature 97.9 F (36.6 C), height '4\' 10"'$  (1.473 m), weight 123 lb (55.8 kg), SpO2 99 %. Body mass index is 25.71 kg/m.  General: Cooperative, alert, well developed, in no acute distress. HEENT: Conjunctivae and lids unremarkable. Cardiovascular: Regular rhythm.  Lungs: Normal work of breathing. Neurologic: No focal deficits.   Lab Results  Component Value Date   CREATININE 0.63 12/28/2021   BUN 13 12/28/2021   NA 142 12/28/2021   K 4.5 12/28/2021   CL 105 12/28/2021   CO2 21 12/28/2021   Lab Results  Component Value Date   ALT 14 12/28/2021   AST 18 12/28/2021   ALKPHOS 63 12/28/2021   BILITOT  0.7 12/28/2021   Lab Results  Component Value Date   HGBA1C 4.9 12/28/2021   HGBA1C 5.0 08/19/2021   HGBA1C 5.0 08/19/2021   HGBA1C 5.0 (A) 08/19/2021   HGBA1C 5.0 08/19/2021   Lab Results  Component Value Date   INSULIN 3.8 12/28/2021   INSULIN 8.7 01/10/2020   Lab Results  Component Value Date   TSH 2.530 12/28/2021   Lab Results  Component Value  Date   CHOL 120 05/19/2021   HDL 37 (L) 05/19/2021   LDLCALC 66 05/19/2021   TRIG 84 05/19/2021   CHOLHDL 3.2 05/19/2021   Lab Results  Component Value Date   VD25OH 35.6 12/28/2021   VD25OH 22.7 (L) 10/09/2021   VD25OH 30.0 01/01/2021   Lab Results  Component Value Date   WBC 7.6 10/09/2021   HGB 13.1 10/09/2021   HCT 37.9 10/09/2021   MCV 89 10/09/2021   PLT 324 10/09/2021   Lab Results  Component Value Date   IRON 83 05/19/2021   TIBC 230 (L) 05/19/2021   FERRITIN 478 (H) 05/19/2021   Attestation Statements:   Reviewed by clinician on day of visit: allergies, medications, problem list, medical history, surgical history, family history, social history, and previous encounter notes.  I, Davy Pique, RMA, am acting as Location manager for Mina Marble, NP.  I have reviewed the above documentation for accuracy and completeness, and I agree with the above. -  Daron Breeding d. Orah Sonnen, NP-C

## 2022-02-02 DIAGNOSIS — G8929 Other chronic pain: Secondary | ICD-10-CM | POA: Insufficient documentation

## 2022-02-03 ENCOUNTER — Encounter (INDEPENDENT_AMBULATORY_CARE_PROVIDER_SITE_OTHER): Payer: Self-pay

## 2022-02-03 ENCOUNTER — Telehealth (INDEPENDENT_AMBULATORY_CARE_PROVIDER_SITE_OTHER): Payer: Self-pay | Admitting: Adult Health

## 2022-02-03 NOTE — Telephone Encounter (Signed)
Betty Cain - Prior authorization denied for [employer]. Per insurance: Medicaid does not cover obesity medication/not covered. Patient sent denial message via mychart.

## 2022-02-05 ENCOUNTER — Encounter: Payer: Self-pay | Admitting: Internal Medicine

## 2022-02-11 ENCOUNTER — Other Ambulatory Visit (HOSPITAL_COMMUNITY)
Admission: RE | Admit: 2022-02-11 | Discharge: 2022-02-11 | Disposition: A | Discharge status: Home / Self Care | Payer: Medicaid Other | Source: Ambulatory Visit | Attending: Obstetrics and Gynecology | Admitting: Obstetrics and Gynecology

## 2022-02-11 ENCOUNTER — Ambulatory Visit (INDEPENDENT_AMBULATORY_CARE_PROVIDER_SITE_OTHER): Payer: Medicaid Other | Admitting: Obstetrics and Gynecology

## 2022-02-11 ENCOUNTER — Encounter: Payer: Self-pay | Admitting: Obstetrics and Gynecology

## 2022-02-11 VITALS — BP 110/74 | HR 81 | Ht <= 58 in | Wt 123.0 lb

## 2022-02-11 DIAGNOSIS — N9412 Deep dyspareunia: Secondary | ICD-10-CM | POA: Diagnosis not present

## 2022-02-11 DIAGNOSIS — Z01419 Encounter for gynecological examination (general) (routine) without abnormal findings: Secondary | ICD-10-CM | POA: Diagnosis not present

## 2022-02-11 DIAGNOSIS — N912 Amenorrhea, unspecified: Secondary | ICD-10-CM

## 2022-02-11 LAB — POCT URINE PREGNANCY: Preg Test, Ur: NEGATIVE

## 2022-02-11 NOTE — Progress Notes (Signed)
Pt presents for annual reports no cycles since August.  She has questions regarding HPV vaccine.  Negative pap 12/22/20 Negative mammogram 12/25/20 Used arabic interpreter [id number] UPT negative

## 2022-02-11 NOTE — Progress Notes (Signed)
Subjective:     Betty Cain is a 46 y.o. [id number] female with LMP 11/2021 and BMI 25 who is here for a comprehensive physical exam. The patient reports irregular menstrual citing menstruation every 4-5 months with dark brown spotting lasting 4-5 days. She has also been experiencing deep vaginal dyspareunia that has increased in severity over the past year. She has had watery discharge over the last few days but does not report andy odor, itching or irritation. She denies pelvic pain. She is sexually active. She denies vasomotor symptoms. She is followed by a nutritionist and has optimized her diabetes management as she continues to lose weight  Past Medical History:  Diagnosis Date   Allergy    Anemia    Anxiety 01/2019   Asthma    B12 deficiency    Back pain    Common migraine with intractable migraine 06/28/2017   Constipation    Diabetes ([place]) 02/2019   Fatigue    GERD (gastroesophageal reflux disease)    pos H pylori   Hypertension    controlled with medication   IBS (irritable bowel syndrome) 2005   Joint pain    Neonatal death    Vaginal delivery, full term-lived x2 hours.    Palpitations    Shortness of breath    Shortness of breath on exertion    Spinal headache    Swelling of both lower extremities    Valvular heart disease    Vitamin D deficiency 02/2019   Past Surgical History:  Procedure Laterality Date   ADENOIDECTOMY     and tonsils (as a child)   BIOPSY  08/27/2021   Procedure: BIOPSY;  Surgeon: Yetta Flock, MD;  Location: [facility];  Service: Gastroenterology;;   boil  2003   right elbow   CESAREAN SECTION     x4   CESAREAN SECTION  06/02/2011   Procedure: CESAREAN SECTION;  Surgeon: [name], MD;  Location: [facility];  Service: Gynecology;  Laterality: N/A;  Primary Cesarean Section Delivery Baby Boy @ 0004, Apgars 9/9   CESAREAN SECTION N/A 12/10/2013   Procedure: REPEAT CESAREAN SECTION;  Surgeon: [name], MD;  Location: [facility];   Service: Obstetrics;  Laterality: N/A;   CESAREAN SECTION     colonoscopy  11/23/2018   stated had issues with sleep when in for procedure   ESOPHAGOGASTRODUODENOSCOPY (EGD) WITH PROPOFOL N/A 08/27/2021   Procedure: ESOPHAGOGASTRODUODENOSCOPY (EGD) WITH PROPOFOL;  Surgeon: Yetta Flock, MD;  Location: [facility];  Service: Gastroenterology;  Laterality: N/A;   OTHER SURGICAL HISTORY  2005   Uterine surgery    uterine cauterization     Family History  Problem Relation Age of Onset   Diabetes Mother    Diabetes Father    Diabetes Sister    Anesthesia problems Neg Hx    Colon cancer Neg Hx    Esophageal cancer Neg Hx    Rectal cancer Neg Hx    Stomach cancer Neg Hx    Colon polyps Neg Hx    Migraines Neg Hx     Social History   Socioeconomic History   Marital status: Married    Spouse name: [name]   Number of children: 4   Years of education: Not on file   Highest education level: Not on file  Occupational History   Occupation: Unemployed  Tobacco Use   Smoking status: Never   Smokeless tobacco: Never  Vaping Use   Vaping Use: Never used  Substance and Sexual  Activity   Alcohol use: No   Drug use: No   Sexual activity: Yes    Birth control/protection: None    Comment: pregnant  Other Topics Concern   Not on file  Social History Narrative   Lives with husband and child   Caffeine use: daily (tea), sometimes coffee/soda   Right handed    Social Determinants of Health   Financial Resource Strain: Not on file  Food Insecurity: No Food Insecurity (03/12/2020)   Hunger Vital Sign    Worried About Running Out of Food in the Last Year: Never true    Ran Out of Food in the Last Year: Never true  Transportation Needs: Not on file  Physical Activity: Not on file  Stress: Not on file  Social Connections: Not on file  Intimate Partner Violence: Not on file   Health Maintenance  Topic Date Due   OPHTHALMOLOGY EXAM  Never done   COVID-19 Vaccine (3 -  Pfizer series) 09/22/2019   FOOT EXAM  04/02/2021   MAMMOGRAM  12/22/2021   Diabetic kidney evaluation - Urine ACR  05/19/2022   HEMOGLOBIN A1C  06/28/2022   Diabetic kidney evaluation - GFR measurement  12/29/2022   COLONOSCOPY (Pts 45-44yr Insurance coverage will need to be confirmed)  11/23/2023   TETANUS/TDAP  12/12/2023   PAP SMEAR-Modifier  12/23/2023   Hepatitis C Screening  Completed   HIV Screening  Completed   HPV VACCINES  Aged Out    The following portions of the patient's history were reviewed and updated as appropriate: allergies, current medications, past family history, past medical history, past social history, past surgical history, and problem list.  Review of Systems Genitourinary:positive for abnormal menstrual periods, sexual problems, and vaginal discharge   Objective:  Blood pressure 110/74, pulse 81, height '4\' 10"'$  (1.473 m), weight 123 lb (55.8 kg), last menstrual period 11/12/2021.   General appearance: alert, cooperative, appears stated age, and no distress Lungs: clear to auscultation bilaterally and normal percussion bilaterally Breasts: normal appearance, no masses or tenderness, Inspection negative, No nipple retraction or dimpling, No nipple discharge or bleeding, No axillary or supraclavicular adenopathy Heart: normal apical impulse and S1, S2 normal Abdomen: soft, non-tender; bowel sounds normal; no masses,  no organomegaly Pelvic: cervix normal in appearance, external genitalia normal, no adnexal masses or tenderness, no cervical motion tenderness, positive findings: vaginal discharge:  white, rectovaginal septum normal, uterus normal size, shape, and consistency, and vagina normal without discharge    Assessment:    Healthy female exam. Patient's irregular menstrual cycle is likely due to perimenopause. Dyspareunia could be due to perimenopausal atrophy or possible bacterial vaginosis given white discharge.      Plan:   Recommended water based  lubricant during intercourse Vaginal swab collected Pap smear collected  Screening mammogram ordered Encouraged patient to continue her health optimization efforts Follow up with PCP as scheduled Patient will be contacted with abnormal results     See After Visit Summary for Counseling Recommendations    Attestation of Supervision of Student:  I confirm that I have verified the information documented in the medical student's note and that I have also personally reperformed the history, physical exam and all medical decision making activities.  I have verified that all services and findings are accurately documented in this student's note; and I agree with management and plan as outlined in the documentation. I have also made any necessary editorial changes.  I edited the noted as needed  PMora Bellman MD  [facility] [employer] [facility] 02/11/2022 11:46 AM

## 2022-02-12 LAB — CERVICOVAGINAL ANCILLARY ONLY
Bacterial Vaginitis (gardnerella): NEGATIVE
Candida Glabrata: NEGATIVE
Candida Vaginitis: NEGATIVE
Comment: NEGATIVE
Comment: NEGATIVE
Comment: NEGATIVE

## 2022-02-12 LAB — CYTOLOGY - PAP
Adequacy: ABSENT
Diagnosis: NEGATIVE

## 2022-02-14 ENCOUNTER — Other Ambulatory Visit (INDEPENDENT_AMBULATORY_CARE_PROVIDER_SITE_OTHER): Payer: Self-pay | Admitting: Nurse Practitioner

## 2022-02-20 ENCOUNTER — Encounter: Payer: Self-pay | Admitting: Obstetrics and Gynecology

## 2022-02-23 ENCOUNTER — Encounter: Payer: Self-pay | Admitting: Obstetrics and Gynecology

## 2022-02-24 ENCOUNTER — Ambulatory Visit (INDEPENDENT_AMBULATORY_CARE_PROVIDER_SITE_OTHER): Payer: Medicaid Other | Admitting: Family Medicine

## 2022-02-24 ENCOUNTER — Encounter (INDEPENDENT_AMBULATORY_CARE_PROVIDER_SITE_OTHER): Payer: Self-pay | Admitting: Family Medicine

## 2022-02-24 VITALS — BP 95/68 | HR 66 | Temp 98.6°F | Ht <= 58 in | Wt 125.0 lb

## 2022-02-24 DIAGNOSIS — E669 Obesity, unspecified: Secondary | ICD-10-CM

## 2022-02-24 DIAGNOSIS — Z6826 Body mass index (BMI) 26.0-26.9, adult: Secondary | ICD-10-CM | POA: Diagnosis not present

## 2022-02-24 DIAGNOSIS — E1140 Type 2 diabetes mellitus with diabetic neuropathy, unspecified: Secondary | ICD-10-CM | POA: Diagnosis not present

## 2022-02-24 DIAGNOSIS — E559 Vitamin D deficiency, unspecified: Secondary | ICD-10-CM | POA: Diagnosis not present

## 2022-02-24 MED ORDER — ACCU-CHEK GUIDE VI STRP: ORAL_STRIP | 12 refills | Status: AC

## 2022-02-24 MED ORDER — WEGOVY 0.5 MG/0.5ML ~~LOC~~ SOAJ: 0.5000 mg | SUBCUTANEOUS | 0 refills | Status: DC

## 2022-02-24 MED ORDER — ONETOUCH DELICA LANCETS 33G MISC: 1.0000 "application " | Freq: Four times a day (QID) | 11 refills | Status: DC

## 2022-02-26 ENCOUNTER — Ambulatory Visit (HOSPITAL_BASED_OUTPATIENT_CLINIC_OR_DEPARTMENT_OTHER)
Admission: RE | Admit: 2022-02-26 | Discharge: 2022-02-26 | Disposition: A | Discharge status: Home / Self Care | Payer: Medicaid Other | Source: Ambulatory Visit | Attending: Obstetrics and Gynecology | Admitting: Obstetrics and Gynecology

## 2022-02-26 DIAGNOSIS — Z01419 Encounter for gynecological examination (general) (routine) without abnormal findings: Secondary | ICD-10-CM | POA: Diagnosis not present

## 2022-02-26 DIAGNOSIS — Z1231 Encounter for screening mammogram for malignant neoplasm of breast: Secondary | ICD-10-CM | POA: Diagnosis not present

## 2022-03-05 ENCOUNTER — Other Ambulatory Visit (INDEPENDENT_AMBULATORY_CARE_PROVIDER_SITE_OTHER): Payer: Self-pay | Admitting: Adult Health

## 2022-03-05 ENCOUNTER — Encounter (INDEPENDENT_AMBULATORY_CARE_PROVIDER_SITE_OTHER): Payer: Self-pay | Admitting: Family Medicine

## 2022-03-05 DIAGNOSIS — E1140 Type 2 diabetes mellitus with diabetic neuropathy, unspecified: Secondary | ICD-10-CM

## 2022-03-08 NOTE — Progress Notes (Signed)
Chief Complaint:   OBESITY Betty Cain is here to discuss her progress with her obesity treatment plan along with follow-up of her obesity related diagnoses. Betty Cain is on the Category 2 Plan and states she is following her eating plan approximately 70% of the time. Betty Cain states she is walking 20 minutes 1 times per week.  Today's visit was #: 42 Starting weight: 152 lbs Starting date: 01/10/2020 Today's weight: 125 lbs Today's date: 02/24/2022 Total lbs lost to date: 27 lbs Total lbs lost since last in-office visit: +2 lbs  Interim History: She has been skipping meals regularly, mostly lunch daily.  She is here today with her neighbor friend who is interpreting for her.  She has no issues with the meal plan.   Subjective:   1. Type 2 diabetes mellitus with obesity A1c was 4.9 on 12/28/21.  Highest blood sugar 101, it is usually 85-95 during the day.    2. Vitamin D deficiency Last Vitamin D checked 12/28/2021 at 35.6.  she was on 1,000 IU, it was doubled at last visit.    Assessment/Plan:  No orders of the defined types were placed in this encounter.   Medications Discontinued During This Encounter  Medication Reason   Semaglutide (OZEMPIC, 1 MG/DOSE, [place])    OneTouch Delica Lancets 74Y MISC Reorder   glucose blood (ACCU-CHEK GUIDE) test strip Reorder   Semaglutide-Weight Management (WEGOVY) 0.5 MG/0.5ML SOAJ Reorder     Meds ordered this encounter  Medications   glucose blood (ACCU-CHEK GUIDE) test strip    Sig: Use as instructed    Dispense:  100 each    Refill:  12   OneTouch Delica Lancets 81K MISC    Sig: 1 application  by Does not apply route in the morning, at noon, in the evening, and at bedtime.    Dispense:  100 each    Refill:  11   DISCONTD: Semaglutide-Weight Management (WEGOVY) 0.5 MG/0.5ML SOAJ    Sig: Inject 0.5 mg into the skin once a week.    Dispense:  2 mL    Refill:  0     1. Type 2 diabetes mellitus with obesity Good blood sugar control is  important to decrease the likelihood of diabetic complications such as nephropathy, neuropathy, limb loss, blindness, coronary artery disease, and death. Intensive lifestyle modification including diet, exercise and weight loss are the first line of treatment for diabetes.   Refill - glucose blood (ACCU-CHEK GUIDE) test strip; Use as instructed  Dispense: 100 each; Refill: 12  Refill - OneTouch Delica Lancets 48J MISC; 1 application  by Does not apply route in the morning, at noon, in the evening, and at bedtime.  Dispense: 100 each; Refill: 11  2. Vitamin D deficiency Continue 2,000 IU daily.   3. Obesity, current BMI 26.3 Refill - Semaglutide-Weight Management (WEGOVY) 0.5 MG/0.5ML SOAJ; Inject 0.5 mg into the skin once a week.  Dispense: 2 mL; Refill: 0  Betty Cain is currently in the action stage of change. As such, her goal is to continue with weight loss efforts. She has agreed to the Category 2 Plan.   Exercise goals:  As is.   Behavioral modification strategies: increasing lean protein intake, no skipping meals, and avoiding temptations.  Betty Cain has agreed to follow-up with our clinic in 3-4 weeks. She was informed of the importance of frequent follow-up visits to maximize her success with intensive lifestyle modifications for her multiple health conditions.   Objective:   Blood pressure 95/68,  pulse 66, temperature 98.6 F (37 C), height '4\' 10"'$  (1.473 m), weight 125 lb (56.7 kg), last menstrual period 11/12/2021, SpO2 92 %. Body mass index is 26.13 kg/m.  General: Cooperative, alert, well developed, in no acute distress. HEENT: Conjunctivae and lids unremarkable. Cardiovascular: Regular rhythm.  Lungs: Normal work of breathing. Neurologic: No focal deficits.   Lab Results  Component Value Date   CREATININE 0.63 12/28/2021   BUN 13 12/28/2021   NA 142 12/28/2021   K 4.5 12/28/2021   CL 105 12/28/2021   CO2 21 12/28/2021   Lab Results  Component Value Date   ALT 14  12/28/2021   AST 18 12/28/2021   ALKPHOS 63 12/28/2021   BILITOT 0.7 12/28/2021   Lab Results  Component Value Date   HGBA1C 4.9 12/28/2021   HGBA1C 5.0 08/19/2021   HGBA1C 5.0 08/19/2021   HGBA1C 5.0 (A) 08/19/2021   HGBA1C 5.0 08/19/2021   Lab Results  Component Value Date   INSULIN 3.8 12/28/2021   INSULIN 8.7 01/10/2020   Lab Results  Component Value Date   TSH 2.530 12/28/2021   Lab Results  Component Value Date   CHOL 120 05/19/2021   HDL 37 (L) 05/19/2021   LDLCALC 66 05/19/2021   TRIG 84 05/19/2021   CHOLHDL 3.2 05/19/2021   Lab Results  Component Value Date   VD25OH 35.6 12/28/2021   VD25OH 22.7 (L) 10/09/2021   VD25OH 30.0 01/01/2021   Lab Results  Component Value Date   WBC 7.6 10/09/2021   HGB 13.1 10/09/2021   HCT 37.9 10/09/2021   MCV 89 10/09/2021   PLT 324 10/09/2021   Lab Results  Component Value Date   IRON 83 05/19/2021   TIBC 230 (L) 05/19/2021   FERRITIN 478 (H) 05/19/2021   Attestation Statements:   Reviewed by clinician on day of visit: allergies, medications, problem list, medical history, surgical history, family history, social history, and previous encounter notes.  I, Davy Pique, RMA, am acting as Location manager for [employer], DO.  I have reviewed the above documentation for accuracy and completeness, and I agree with the above. Betty Cain, D.O.  The [place] was signed into law in 2016 which includes the topic of electronic health records.  This provides immediate access to information in MyChart.  This includes consultation notes, operative notes, office notes, lab results and pathology reports.  If you have any questions about what you read please let us know at your next visit so we can discuss your concerns and take corrective action if need be.  We are right here with you.

## 2022-03-11 ENCOUNTER — Other Ambulatory Visit: Payer: Self-pay

## 2022-03-15 ENCOUNTER — Other Ambulatory Visit (INDEPENDENT_AMBULATORY_CARE_PROVIDER_SITE_OTHER): Payer: Self-pay | Admitting: Family Medicine

## 2022-03-15 DIAGNOSIS — E1140 Type 2 diabetes mellitus with diabetic neuropathy, unspecified: Secondary | ICD-10-CM

## 2022-03-15 MED ORDER — OZEMPIC (0.25 OR 0.5 MG/DOSE) 2 MG/1.5ML ~~LOC~~ SOPN: 0.5000 mg | PEN_INJECTOR | SUBCUTANEOUS | 0 refills | Status: DC

## 2022-03-17 DIAGNOSIS — E119 Type 2 diabetes mellitus without complications: Secondary | ICD-10-CM | POA: Diagnosis not present

## 2022-03-17 DIAGNOSIS — E7800 Pure hypercholesterolemia, unspecified: Secondary | ICD-10-CM | POA: Diagnosis not present

## 2022-03-18 DIAGNOSIS — I959 Hypotension, unspecified: Secondary | ICD-10-CM | POA: Insufficient documentation

## 2022-03-19 ENCOUNTER — Encounter: Payer: Self-pay | Admitting: Internal Medicine

## 2022-03-19 ENCOUNTER — Ambulatory Visit: Payer: Medicaid Other | Attending: Internal Medicine | Admitting: Internal Medicine

## 2022-03-19 VITALS — BP 109/79 | HR 95 | Ht <= 58 in | Wt 131.8 lb

## 2022-03-19 DIAGNOSIS — R002 Palpitations: Secondary | ICD-10-CM

## 2022-03-19 DIAGNOSIS — R000 Tachycardia, unspecified: Secondary | ICD-10-CM | POA: Diagnosis not present

## 2022-03-19 DIAGNOSIS — I9589 Other hypotension: Secondary | ICD-10-CM

## 2022-03-19 NOTE — Patient Instructions (Signed)
Medication Instructions:  Your physician recommends that you continue on your current medications as directed. Please refer to the Current Medication list given to you today.  *If you need a refill on your cardiac medications before your next appointment, please call your pharmacy*   Lab Work: None ordered.  If you have labs (blood work) drawn today and your tests are completely normal, you will receive your results only by: MyChart Message (if you have MyChart) OR A paper copy in the mail If you have any lab test that is abnormal or we need to change your treatment, we will call you to review the results.   Testing/Procedures: None ordered.    Follow-Up: At [facility], you and your health needs are our priority.  As part of our continuing mission to provide you with exceptional heart care, we have created designated Provider Care Teams.  These Care Teams include your primary Cardiologist (physician) and Advanced Practice Providers (APPs -  Physician Assistants and Nurse Practitioners) who all work together to provide you with the care you need, when you need it.  We recommend signing up for the patient portal called "MyChart".  Sign up information is provided on this After Visit Summary.  MyChart is used to connect with patients for Virtual Visits (Telemedicine).  Patients are able to view lab/test results, encounter notes, upcoming appointments, etc.  Non-urgent messages can be sent to your provider as well.   To learn more about what you can do with MyChart, go to [email or web address].    Your next appointment:   12 months with Dr Klein  Important Information About Sugar       

## 2022-03-19 NOTE — Progress Notes (Signed)
Patient Care Team: Tresa Garter, MD as PCP - General (Internal Medicine)   HPI  Betty Cain is a 46 y.o. female seen in follow-up for palpitations off and exertional occurred in the context of longstanding diabetes.  No evidence of orthostatic intolerance at that visit, this not withstanding very rapid increases in heart rate with activity.  And her monitor showing about 20% of the beats faster than 100 (although only 20%).  We discontinued metoprolol and try her on bisoprolol and low-dose clonidine  Then notable for some low blood pressures in the bisoprolol was decreased; compression advised  She has been significantly better.  Able to walk with less tachycardia.  Less dizziness.  Dizziness with exercising can be problematic.  Atypical chest pains  non exertional with motion; also some associated with recumbency and relieved by eating.  Has a history of known reflux disease.  Takes PPI DATE TEST EF    6/15 Echo   65 %    1/20 Myoview  65% No ischemia  1/23 CTA   CaScore 0    Date Cr K Hgb  6/23 0.54 4.5 13.1                 Records and Results Reviewed   Past Medical History:  Diagnosis Date   Allergy    Anemia    Anxiety 01/2019   Asthma    B12 deficiency    Back pain    Common migraine with intractable migraine 06/28/2017   Constipation    Diabetes ([place]) 02/2019   Fatigue    GERD (gastroesophageal reflux disease)    pos H pylori   Hypertension    controlled with medication   IBS (irritable bowel syndrome) 2005   Joint pain    Neonatal death    Vaginal delivery, full term-lived x2 hours.    Palpitations    Shortness of breath    Shortness of breath on exertion    Spinal headache    Swelling of both lower extremities    Valvular heart disease    Vitamin D deficiency 02/2019    Past Surgical History:  Procedure Laterality Date   ADENOIDECTOMY     and tonsils (as a child)   BIOPSY  08/27/2021   Procedure: BIOPSY;  Surgeon: Yetta Flock, MD;  Location: [facility];  Service: Gastroenterology;;   boil  2003   right elbow   CESAREAN SECTION     x4   CESAREAN SECTION  06/02/2011   Procedure: CESAREAN SECTION;  Surgeon: [name], MD;  Location: [facility];  Service: Gynecology;  Laterality: N/A;  Primary Cesarean Section Delivery Baby Boy @ 0004, Apgars 9/9   CESAREAN SECTION N/A 12/10/2013   Procedure: REPEAT CESAREAN SECTION;  Surgeon: [name], MD;  Location: [facility];  Service: Obstetrics;  Laterality: N/A;   CESAREAN SECTION     colonoscopy  11/23/2018   stated had issues with sleep when in for procedure   ESOPHAGOGASTRODUODENOSCOPY (EGD) WITH PROPOFOL N/A 08/27/2021   Procedure: ESOPHAGOGASTRODUODENOSCOPY (EGD) WITH PROPOFOL;  Surgeon: Yetta Flock, MD;  Location: [facility];  Service: Gastroenterology;  Laterality: N/A;   OTHER SURGICAL HISTORY  2005   Uterine surgery    uterine cauterization      Current Meds  Medication Sig   acetaminophen (TYLENOL) 500 MG tablet Take 500 mg by mouth every 6 (six) hours as needed.   bisoprolol (ZEBETA) 5 MG tablet Take 1 tablet (  5 mg total) by mouth 2 (two) times daily.   blood glucose meter kit and supplies KIT Dispense based on patient and insurance preference. Use up to four times daily as directed.   Blood Pressure Monitoring DEVI 1 each by Does not apply route daily.   cetirizine (ZYRTEC) 10 MG tablet Take 1 tablet (10 mg total) by mouth daily.   cloNIDine (CATAPRES) 0.1 MG tablet Take 1 tablet (0.1 mg total) by mouth 2 (two) times daily.   ferrous sulfate (FEROSUL) 325 (65 FE) MG tablet TAKE 1 TABLET (325 MG TOTAL) BY MOUTH DAILY.   Fremanezumab-vfrm (AJOVY) 225 MG/1.5ML SOAJ Inject 225 mg into the skin every 30 (thirty) days.   glucose blood (ACCU-CHEK GUIDE) test strip Use as instructed   ivabradine (CORLANOR) 5 MG TABS tablet Take 3 tablets by mouth two hours prior to cardiac CT scan.   meloxicam (MOBIC) 15 MG tablet Take 1 tablet (15 mg total) by  mouth daily.   methylcellulose (CITRUCEL) oral powder Take as directed, daily   metoCLOPramide (REGLAN) 5 MG tablet Take 1 tablet (5 mg total) by mouth 3 (three) times daily before meals.   Multiple Vitamin (MULTI VITAMIN DAILY PO) Take by mouth.   ondansetron (ZOFRAN-ODT) 8 MG disintegrating tablet TAKE 1 TABLET BY MOUTH EVERY 8 HOURS AS NEEDED FOR NAUSEA OR VOMITING.   OneTouch Delica Lancets 26S MISC 1 application  by Does not apply route in the morning, at noon, in the evening, and at bedtime.   oxyCODONE-acetaminophen (PERCOCET/ROXICET) 5-325 MG tablet Take 1 tablet by mouth every 6 (six) hours as needed for severe pain.   pantoprazole (PROTONIX) 40 MG tablet Take 1 tablet (40 mg total) by mouth 2 (two) times daily before a meal.   Plecanatide (TRULANCE) 3 MG TABS Take 3 mg by mouth daily.   Rimegepant Sulfate 75 MG TBDP Take 1 tab at onset of migraine. May repeat in 2 hrs, if needed. Max dose: 2 tabs/day or 15/month. This is a 90 day rx.   rosuvastatin (CRESTOR) 5 MG tablet TAKE 1 TABLET (5 MG TOTAL) BY MOUTH AT BEDTIME.   Semaglutide,0.25 or 0.5MG/DOS, (OZEMPIC, 0.25 OR 0.5 MG/DOSE,) 2 MG/1.5ML SOPN Inject 0.5 mg into the skin once a week.   sucralfate (CARAFATE) 1 g tablet Take 1 tablet (1 g total) by mouth every 6 (six) hours as needed. Slowly dissolve 1 tablet in 1 tablespoon of distilled water prior to ingestion.    Allergies  Allergen Reactions   Topamax [Topiramate] Itching and Rash      Review of Systems negative except from HPI and PMH  Physical Exam BP 109/79 (BP Location: Right Arm, Patient Position: Standing)   Pulse 95   Ht _0  (1.473 m)   Wt 131 lb 12.8 oz (59.8 kg)   LMP 03/14/2022   SpO2 98%   BMI 27.55 kg/m  Well developed and well nourished in no acute distress HENT normal E scleral and icterus clear Neck Supple;   Clear to ausculation Regular rate and rhythm, no murmurs gallops or rub Soft with active bowel sounds No clubbing cyanosis or CVA  ibuprofen urine crystals negative in January we can transmit that that you are thing already what ever edema Alert and oriented, grossly normal motor and sensory function Skin Warm and Dry  ECG sinus at 88 Oh 15/08/43 PVC-isolated  CrCl cannot be calculated (Patient's most recent lab result is older than the maximum 21 days allowed.).   Assessment and  Plan Palpitations  Hypotension ?iatrogenic   Exertional tachycardia   Diabetes ? Neuropathy   Chest pain   GERD   Patient is are much improved.  Some exertional dizziness.  Suspect some degree of hypotension.  Have suggested that she keep the room cooler we will have her take her a.m. bisoprolol following exercise instead of before.  Continue the Corlanor and the low-dose bisoprolol.  Suspect chest pain is related to reflux, in light of the negative CTA.  She is already on pantoprazole.  Suggested she raise the head of her bed 4-6 inches.   Current medicines are reviewed at length with the patient today .  The patient does not  have concerns regarding medicines.

## 2022-03-25 ENCOUNTER — Ambulatory Visit (INDEPENDENT_AMBULATORY_CARE_PROVIDER_SITE_OTHER): Payer: Medicaid Other | Admitting: Family Medicine

## 2022-03-25 ENCOUNTER — Encounter (INDEPENDENT_AMBULATORY_CARE_PROVIDER_SITE_OTHER): Payer: Self-pay | Admitting: Family Medicine

## 2022-03-25 VITALS — BP 115/78 | HR 86 | Temp 98.8°F | Ht <= 58 in | Wt 126.4 lb

## 2022-03-25 DIAGNOSIS — E669 Obesity, unspecified: Secondary | ICD-10-CM | POA: Diagnosis not present

## 2022-03-25 DIAGNOSIS — Z6826 Body mass index (BMI) 26.0-26.9, adult: Secondary | ICD-10-CM

## 2022-03-25 DIAGNOSIS — E1169 Type 2 diabetes mellitus with other specified complication: Secondary | ICD-10-CM

## 2022-03-25 DIAGNOSIS — Z7985 Long-term (current) use of injectable non-insulin antidiabetic drugs: Secondary | ICD-10-CM | POA: Diagnosis not present

## 2022-03-25 MED ORDER — OZEMPIC (0.25 OR 0.5 MG/DOSE) 2 MG/1.5ML ~~LOC~~ SOPN: 0.5000 mg | PEN_INJECTOR | SUBCUTANEOUS | 0 refills | Status: DC

## 2022-04-01 ENCOUNTER — Other Ambulatory Visit: Payer: Self-pay | Admitting: Adult Health

## 2022-04-01 ENCOUNTER — Other Ambulatory Visit: Payer: Self-pay | Admitting: *Deleted

## 2022-04-01 MED ORDER — AJOVY 225 MG/1.5ML ~~LOC~~ SOAJ: 225.0000 mg | SUBCUTANEOUS | 2 refills | Status: DC

## 2022-04-07 NOTE — Progress Notes (Signed)
Chief Complaint:   OBESITY Betty Cain is here to discuss her progress with her obesity treatment plan along with follow-up of her obesity related diagnoses. Betty Cain is on the Category 2 Plan and states she is following her eating plan approximately 75% of the time. Betty Cain states she is chair exercises 5 minutes 7 times per week.  Today's visit was #: 66 Starting weight: 152 LBS Starting date: 01/10/2020 Today's weight: 126 LBS Today's date: 03/25/2022 Total lbs lost to date: 26 LBS Total lbs lost since last in-office visit: +1 LB  Interim History: Betty Cain, interpreter.  She is (so she ate more.  Increased snacking especially at night, apple, nuts, cereal last meal.  No issues with meal plan.  Eating on plan exactly with food recall today.   Subjective:   1. Type 2 diabetes mellitus with obesity ([place]) Ozempic has been all at the pharmacy.  Patient has been off of it for 2 weeks now.  Pharmacy said we never did.  Fasting blood sugars 79-111, she checks it every morning.  A1c 2 months ago was 4.9.  Assessment/Plan:  No orders of the defined types were placed in this encounter.   Medications Discontinued During This Encounter  Medication Reason   Semaglutide,0.25 or 0.'5MG'$ /DOS, (OZEMPIC, 0.25 OR 0.5 MG/DOSE,) 2 MG/1.5ML SOPN Reorder     Meds ordered this encounter  Medications   Semaglutide,0.25 or 0.'5MG'$ /DOS, (OZEMPIC, 0.25 OR 0.5 MG/DOSE,) 2 MG/1.5ML SOPN    Sig: Inject 0.5 mg into the skin once a week.    Dispense:  3 mL    Refill:  0     1. Type 2 diabetes mellitus with obesity (HCC) A1c at goal. Discussed labs with patient today. Good blood sugar control is important to decrease the likelihood of diabetic complications such as nephropathy, neuropathy, limb loss, blindness, coronary artery disease, and death. Intensive lifestyle modification including diet, exercise and weight loss are the first line of treatment for diabetes.   Refill- Semaglutide,0.25 or 0.'5MG'$ /DOS, (OZEMPIC, 0.25 OR  0.5 MG/DOSE,) 2 MG/1.5ML SOPN; Inject 0.5 mg into the skin once a week.  Dispense: 3 mL; Refill: 0  2. Obesity, current BMI 26.4 Holiday eating guide given.  Betty Cain is currently in the action stage of change. As such, her goal is to continue with weight loss efforts. She has agreed to the Category 2 Plan.   Exercise goals:  Increase exercise as tolerated.  Behavioral modification strategies: increasing lean protein intake and decreasing simple carbohydrates.  Betty Cain has agreed to follow-up with our clinic in 4 weeks. She was informed of the importance of frequent follow-up visits to maximize her success with intensive lifestyle modifications for her multiple health conditions.   Objective:   Blood pressure 115/78, pulse 86, temperature 98.8 F (37.1 C), height '4\' 10"'$  (1.473 m), weight 126 lb 6.4 oz (57.3 kg), last menstrual period 03/14/2022, SpO2 100 %. Body mass index is 26.42 kg/m.  General: Cooperative, alert, well developed, in no acute distress. HEENT: Conjunctivae and lids unremarkable. Cardiovascular: Regular rhythm.  Lungs: Normal work of breathing. Neurologic: No focal deficits.   Lab Results  Component Value Date   CREATININE 0.63 12/28/2021   BUN 13 12/28/2021   NA 142 12/28/2021   K 4.5 12/28/2021   CL 105 12/28/2021   CO2 21 12/28/2021   Lab Results  Component Value Date   ALT 14 12/28/2021   AST 18 12/28/2021   ALKPHOS 63 12/28/2021   BILITOT 0.7 12/28/2021   Lab Results  Component Value Date   HGBA1C 4.9 12/28/2021   HGBA1C 5.0 08/19/2021   HGBA1C 5.0 08/19/2021   HGBA1C 5.0 (A) 08/19/2021   HGBA1C 5.0 08/19/2021   Lab Results  Component Value Date   INSULIN 3.8 12/28/2021   INSULIN 8.7 01/10/2020   Lab Results  Component Value Date   TSH 2.530 12/28/2021   Lab Results  Component Value Date   CHOL 120 05/19/2021   HDL 37 (L) 05/19/2021   LDLCALC 66 05/19/2021   TRIG 84 05/19/2021   CHOLHDL 3.2 05/19/2021   Lab Results  Component Value  Date   VD25OH 35.6 12/28/2021   VD25OH 22.7 (L) 10/09/2021   VD25OH 30.0 01/01/2021   Lab Results  Component Value Date   WBC 7.6 10/09/2021   HGB 13.1 10/09/2021   HCT 37.9 10/09/2021   MCV 89 10/09/2021   PLT 324 10/09/2021   Lab Results  Component Value Date   IRON 83 05/19/2021   TIBC 230 (L) 05/19/2021   FERRITIN 478 (H) 05/19/2021   Attestation Statements:   Reviewed by clinician on day of visit: allergies, medications, problem list, medical history, surgical history, family history, social history, and previous encounter notes.  I, Davy Pique, RMA, am acting as Location manager for [employer], DO.  I have reviewed the above documentation for accuracy and completeness, and I agree with the above. Marjory Sneddon, D.O.  The [place] was signed into law in 2016 which includes the topic of electronic health records.  This provides immediate access to information in MyChart.  This includes consultation notes, operative notes, office notes, lab results and pathology reports.  If you have any questions about what you read please let us know at your next visit so we can discuss your concerns and take corrective action if need be.  We are right here with you.

## 2022-04-09 ENCOUNTER — Encounter: Payer: Self-pay | Admitting: Nurse Practitioner

## 2022-04-09 ENCOUNTER — Ambulatory Visit: Payer: Medicaid Other | Admitting: Nurse Practitioner

## 2022-04-09 ENCOUNTER — Ambulatory Visit (HOSPITAL_COMMUNITY)
Admission: RE | Admit: 2022-04-09 | Discharge: 2022-04-09 | Disposition: A | Discharge status: Home / Self Care | Payer: Medicaid Other | Source: Ambulatory Visit | Attending: Nurse Practitioner | Admitting: Nurse Practitioner

## 2022-04-09 VITALS — BP 97/67 | HR 97 | Ht <= 58 in | Wt 136.0 lb

## 2022-04-09 DIAGNOSIS — G8929 Other chronic pain: Secondary | ICD-10-CM | POA: Diagnosis not present

## 2022-04-09 DIAGNOSIS — Z13220 Encounter for screening for lipoid disorders: Secondary | ICD-10-CM

## 2022-04-09 DIAGNOSIS — M549 Dorsalgia, unspecified: Secondary | ICD-10-CM

## 2022-04-09 DIAGNOSIS — M25511 Pain in right shoulder: Secondary | ICD-10-CM | POA: Insufficient documentation

## 2022-04-09 DIAGNOSIS — R42 Dizziness and giddiness: Secondary | ICD-10-CM

## 2022-04-09 DIAGNOSIS — E1165 Type 2 diabetes mellitus with hyperglycemia: Secondary | ICD-10-CM | POA: Diagnosis not present

## 2022-04-09 LAB — POCT GLYCOSYLATED HEMOGLOBIN (HGB A1C): Hemoglobin A1C: 5.1 % (ref 4.0–5.6)

## 2022-04-09 MED ORDER — FEXOFENADINE HCL 180 MG PO TABS: 180.0000 mg | ORAL_TABLET | Freq: Every day | ORAL | 0 refills | Status: DC

## 2022-04-09 NOTE — Patient Instructions (Signed)
1. Type 2 diabetes mellitus with hyperglycemia, without long-term current use of insulin (HCC)  - POCT glycosylated hemoglobin (Hb A1C) - CBC - Comprehensive metabolic panel - Lipid Panel  2. Chronic right shoulder pain  - DG Shoulder Right - Ambulatory referral to Physical Therapy  3. Chronic mid back pain  - DG Shoulder Right - DG Thoracic Spine 2 View - Ambulatory referral to Physical Therapy  4. Vertigo  - Ambulatory referral to Physical Therapy  5. Lipid screening  - Lipid Panel  Follow up:  Follow up in 3 months

## 2022-04-09 NOTE — Assessment & Plan Note (Signed)
-   POCT glycosylated hemoglobin (Hb A1C) - CBC - Comprehensive metabolic panel - Lipid Panel  2. Chronic right shoulder pain  - DG Shoulder Right - Ambulatory referral to Physical Therapy  3. Chronic mid back pain  - DG Shoulder Right - DG Thoracic Spine 2 View - Ambulatory referral to Physical Therapy  4. Vertigo  - Ambulatory referral to Physical Therapy  5. Lipid screening  - Lipid Panel  Follow up:  Follow up in 3 months

## 2022-04-09 NOTE — Progress Notes (Signed)
 @Patient ID: Betty Cain, female    DOB: 07/15/1975, 46 y.o.   MRN: [id number]  Chief Complaint  Patient presents with   Ear Pain    Right ear--pain, dizzy--over 1 month and follow-up    Referring provider: Nichols, Tonya S, NP   HPI  Patient presents today for a follow-up visit.  Patient does complain of right ear pain with some dizziness x 1 month.  Upon exam ears appear normal.  Patient does also complain of ongoing right shoulder pain and mid back pain.  This has been a chronic issue.  We will get x-rays today.  Will refer patient for physical therapy. Denies f/c/s, n/v/d, hemoptysis, PND, leg swelling Denies chest pain or edema       Allergies  Allergen Reactions   Topamax [Topiramate] Itching and Rash    Immunization History  Administered Date(s) Administered   Influenza,inj,Quad PF,6+ Mos 06/19/2013, 12/02/2015   PFIZER(Purple Top)SARS-COV-2 Vaccination 07/07/2019, 07/28/2019   Pneumococcal Conjugate-13 05/21/2019   Tdap 09/13/2013, 12/11/2013    Past Medical History:  Diagnosis Date   Allergy    Anemia    Anxiety 01/2019   Asthma    B12 deficiency    Back pain    Common migraine with intractable migraine 06/28/2017   Constipation    Diabetes (HCC) 02/2019   Fatigue    GERD (gastroesophageal reflux disease)    pos H pylori   Hypertension    controlled with medication   IBS (irritable bowel syndrome) 2005   Joint pain    Neonatal death    Vaginal delivery, full term-lived x2 hours.    Palpitations    Shortness of breath    Shortness of breath on exertion    Spinal headache    Swelling of both lower extremities    Valvular heart disease    Vitamin D deficiency 02/2019    Tobacco History: Social History   Tobacco Use  Smoking Status Never  Smokeless Tobacco Never   Counseling given: Not Answered   Outpatient Encounter Medications as of 04/09/2022  Medication Sig   acetaminophen (TYLENOL) 500 MG tablet Take 500 mg by mouth every 6 (six)  hours as needed.   albuterol (VENTOLIN HFA) 108 (90 Base) MCG/ACT inhaler Inhale 2 puffs into the lungs every 6 (six) hours as needed for wheezing or shortness of breath.   bisoprolol (ZEBETA) 5 MG tablet Take 1 tablet (5 mg total) by mouth 2 (two) times daily.   blood glucose meter kit and supplies KIT Dispense based on patient and insurance preference. Use up to four times daily as directed.   Blood Pressure Monitoring DEVI 1 each by Does not apply route daily.   cloNIDine (CATAPRES) 0.1 MG tablet Take 1 tablet (0.1 mg total) by mouth 2 (two) times daily.   ferrous sulfate (FEROSUL) 325 (65 FE) MG tablet TAKE 1 TABLET (325 MG TOTAL) BY MOUTH DAILY.   fexofenadine (ALLEGRA ALLERGY) 180 MG tablet Take 1 tablet (180 mg total) by mouth daily.   Fremanezumab-vfrm (AJOVY) 225 MG/1.5ML SOAJ Inject 225mg/ 1.5ml in skin every 30 days   glucose blood (ACCU-CHEK GUIDE) test strip Use as instructed   ivabradine (CORLANOR) 5 MG TABS tablet Take 3 tablets by mouth two hours prior to cardiac CT scan.   meloxicam (MOBIC) 15 MG tablet Take 1 tablet (15 mg total) by mouth daily.   methylcellulose (CITRUCEL) oral powder Take as directed, daily   metoCLOPramide (REGLAN) 5 MG tablet Take 1 tablet (5 mg total)   by mouth 3 (three) times daily before meals.   Multiple Vitamin (MULTI VITAMIN DAILY PO) Take by mouth.   ondansetron (ZOFRAN-ODT) 8 MG disintegrating tablet TAKE 1 TABLET BY MOUTH EVERY 8 HOURS AS NEEDED FOR NAUSEA OR VOMITING.   OneTouch Delica Lancets 33G MISC 1 application  by Does not apply route in the morning, at noon, in the evening, and at bedtime.   oxyCODONE-acetaminophen (PERCOCET/ROXICET) 5-325 MG tablet Take 1 tablet by mouth every 6 (six) hours as needed for severe pain.   pantoprazole (PROTONIX) 40 MG tablet Take 1 tablet (40 mg total) by mouth 2 (two) times daily before a meal.   Plecanatide (TRULANCE) 3 MG TABS Take 3 mg by mouth daily.   Rimegepant Sulfate 75 MG TBDP Take 1 tab at onset of  migraine. May repeat in 2 hrs, if needed. Max dose: 2 tabs/day or 15/month. This is a 90 day rx.   rosuvastatin (CRESTOR) 5 MG tablet TAKE 1 TABLET (5 MG TOTAL) BY MOUTH AT BEDTIME.   Semaglutide,0.25 or 0.5MG/DOS, (OZEMPIC, 0.25 OR 0.5 MG/DOSE,) 2 MG/1.5ML SOPN Inject 0.5 mg into the skin once a week.   sucralfate (CARAFATE) 1 g tablet Take 1 tablet (1 g total) by mouth every 6 (six) hours as needed. Slowly dissolve 1 tablet in 1 tablespoon of distilled water prior to ingestion.   [DISCONTINUED] cetirizine (ZYRTEC) 10 MG tablet Take 1 tablet (10 mg total) by mouth daily.   dicyclomine (BENTYL) 10 MG/5ML solution Take 5 mLs (10 mg total) by mouth 4 (four) times daily -  before meals and at bedtime.   [EXPIRED] docusate sodium (COLACE) 100 MG capsule Take 1 capsule (100 mg total) by mouth daily.   fluticasone (FLONASE) 50 MCG/ACT nasal spray PLACE 2 SPRAYS INTO BOTH NOSTRILS DAILY.   [EXPIRED] Fremanezumab-vfrm 225 MG/1.5ML SOAJ INJECT 225 MG INTO THE SKIN EVERY 30 (THIRTY) DAYS.   potassium chloride SA (KLOR-CON M) 20 MEQ tablet Take 1 tablet (20 mEq total) by mouth daily.   [EXPIRED] tiZANidine (ZANAFLEX) 2 MG tablet Take 1 tablet (2 mg total) by mouth every 8 (eight) hours as needed for muscle spasms. Will need follow up for further refills.  12/29/2020   [DISCONTINUED] glipiZIDE (GLUCOTROL) 10 MG tablet Take 1 tablet (10 mg total) by mouth 2 (two) times daily before a meal.   [DISCONTINUED] glucose blood (ACCU-CHEK GUIDE) test strip Use as instructed   [DISCONTINUED] levocetirizine (XYZAL) 5 MG tablet TAKE 1 TABLET (5 MG TOTAL) BY MOUTH EVERY EVENING.   [DISCONTINUED] metoprolol succinate (TOPROL-XL) 100 MG 24 hr tablet Take 1 tablet (100 mg total) by mouth daily.   [DISCONTINUED] metoprolol tartrate (LOPRESSOR) 50 MG tablet Take one tablet by mouth 2 hours prior to CT   [DISCONTINUED] norgestimate-ethinyl estradiol (ORTHO-CYCLEN) 0.25-35 MG-MCG tablet TAKE 1 TABLET BY MOUTH DAILY.    [DISCONTINUED] nortriptyline (PAMELOR) 25 MG capsule Take 1 capsule (25 mg total) by mouth at bedtime.   [DISCONTINUED] OneTouch Delica Lancets 33G MISC 1 application by Does not apply route in the morning, at noon, in the evening, and at bedtime.   [DISCONTINUED] rosuvastatin (CRESTOR) 5 MG tablet TAKE 1 TABLET (5 MG TOTAL) BY MOUTH AT BEDTIME.   [DISCONTINUED] Semaglutide,0.25 or 0.5MG/DOS, (OZEMPIC, 0.25 OR 0.5 MG/DOSE,) 2 MG/3ML SOPN Inject 0.5 mg into the skin once a week.   No facility-administered encounter medications on file as of 04/09/2022.     Review of Systems  Review of Systems  Constitutional: Negative.   HENT:  Positive for   ear pain.   Cardiovascular: Negative.   Gastrointestinal: Negative.   Musculoskeletal:  Positive for arthralgias, back pain and myalgias.  Allergic/Immunologic: Negative.   Neurological: Negative.   Psychiatric/Behavioral: Negative.         Physical Exam  BP 97/67   Pulse 97   Ht 4' 10" (1.473 m)   Wt 136 lb (61.7 kg)   LMP 03/14/2022   SpO2 100%   BMI 28.42 kg/m   Wt Readings from Last 5 Encounters:  04/09/22 136 lb (61.7 kg)  03/25/22 126 lb 6.4 oz (57.3 kg)  03/19/22 131 lb 12.8 oz (59.8 kg)  02/24/22 125 lb (56.7 kg)  02/11/22 123 lb (55.8 kg)     Physical Exam Vitals and nursing note reviewed.  Constitutional:      General: She is not in acute distress.    Appearance: She is well-developed.  Cardiovascular:     Rate and Rhythm: Normal rate and regular rhythm.  Pulmonary:     Effort: Pulmonary effort is normal.     Breath sounds: Normal breath sounds.  Neurological:     Mental Status: She is alert and oriented to person, place, and time.      Lab Results:  CBC    Component Value Date/Time   WBC 7.6 10/09/2021 1219   WBC 5.6 08/30/2018 1240   RBC 4.24 10/09/2021 1219   RBC 4.47 08/30/2018 1240   HGB 13.1 10/09/2021 1219   HCT 37.9 10/09/2021 1219   PLT 324 10/09/2021 1219   MCV 89 10/09/2021 1219   MCH  30.9 10/09/2021 1219   MCH 26.8 08/30/2018 1240   MCHC 34.6 10/09/2021 1219   MCHC 31.7 08/30/2018 1240   RDW 11.9 10/09/2021 1219   LYMPHSABS 3.8 (H) 05/12/2021 1107   MONOABS 0.3 08/30/2018 1240   EOSABS 0.2 05/12/2021 1107   BASOSABS 0.1 05/12/2021 1107    BMET    Component Value Date/Time   NA 142 12/28/2021 0928   K 4.5 12/28/2021 0928   CL 105 12/28/2021 0928   CO2 21 12/28/2021 0928   GLUCOSE 74 12/28/2021 0928   GLUCOSE 185 (H) 08/30/2018 1240   GLUCOSE 77 09/21/2013 1230   BUN 13 12/28/2021 0928   CREATININE 0.63 12/28/2021 0928   CREATININE 0.58 09/14/2016 0939   CALCIUM 9.4 12/28/2021 0928   GFRNONAA 109 01/02/2020 1027   GFRNONAA >89 09/14/2016 0939   GFRAA 126 01/02/2020 1027   GFRAA >89 09/14/2016 0939    BNP No results found for: "BNP"  ProBNP No results found for: "PROBNP"  Imaging: No results found.   Assessment & Plan:   Type 2 diabetes mellitus with hyperglycemia, without long-term current use of insulin (HCC) - POCT glycosylated hemoglobin (Hb A1C) - CBC - Comprehensive metabolic panel - Lipid Panel  2. Chronic right shoulder pain  - DG Shoulder Right - Ambulatory referral to Physical Therapy  3. Chronic mid back pain  - DG Shoulder Right - DG Thoracic Spine 2 View - Ambulatory referral to Physical Therapy  4. Vertigo  - Ambulatory referral to Physical Therapy  5. Lipid screening  - Lipid Panel  Follow up:  Follow up in 3 months     Tonya S Nichols, NP 04/09/2022  

## 2022-04-10 LAB — COMPREHENSIVE METABOLIC PANEL
ALT: 19 IU/L (ref 0–32)
AST: 19 IU/L (ref 0–40)
Albumin/Globulin Ratio: 1.8 (ref 1.2–2.2)
Albumin: 4.8 g/dL (ref 3.9–4.9)
Alkaline Phosphatase: 92 IU/L (ref 44–121)
BUN/Creatinine Ratio: 36 — ABNORMAL HIGH (ref 9–23)
BUN: 23 mg/dL (ref 6–24)
Bilirubin Total: 0.4 mg/dL (ref 0.0–1.2)
CO2: 21 mmol/L (ref 20–29)
Calcium: 9.7 mg/dL (ref 8.7–10.2)
Chloride: 105 mmol/L (ref 96–106)
Creatinine, Ser: 0.64 mg/dL (ref 0.57–1.00)
Globulin, Total: 2.7 g/dL (ref 1.5–4.5)
Glucose: 90 mg/dL (ref 70–99)
Potassium: 4.4 mmol/L (ref 3.5–5.2)
Sodium: 144 mmol/L (ref 134–144)
Total Protein: 7.5 g/dL (ref 6.0–8.5)
eGFR: 112 mL/min/{1.73_m2} (ref >59)

## 2022-04-10 LAB — LIPID PANEL
Chol/HDL Ratio: 2.7 ratio (ref 0.0–4.4)
Cholesterol, Total: 117 mg/dL (ref 100–199)
HDL: 44 mg/dL (ref >39)
LDL Chol Calc (NIH): 58 mg/dL (ref 0–99)
Triglycerides: 75 mg/dL (ref 0–149)
VLDL Cholesterol Cal: 15 mg/dL (ref 5–40)

## 2022-04-10 LAB — CBC
Hematocrit: 37.7 % (ref 34.0–46.6)
Hemoglobin: 12.5 g/dL (ref 11.1–15.9)
MCH: 30 pg (ref 26.6–33.0)
MCHC: 33.2 g/dL (ref 31.5–35.7)
MCV: 91 fL (ref 79–97)
Platelets: 287 10*3/uL (ref 150–450)
RBC: 4.16 x10E6/uL (ref 3.77–5.28)
RDW: 11.9 % (ref 11.7–15.4)
WBC: 8.7 10*3/uL (ref 3.4–10.8)

## 2022-04-15 ENCOUNTER — Other Ambulatory Visit: Payer: Self-pay | Admitting: Nurse Practitioner

## 2022-04-19 NOTE — Telephone Encounter (Signed)
Patient will need appointment. Thanks.

## 2022-04-20 ENCOUNTER — Telehealth: Payer: Self-pay

## 2022-04-20 NOTE — Telephone Encounter (Signed)
Pt was schedule to see [facility] for toe concerns. Betty Cain

## 2022-04-20 NOTE — Telephone Encounter (Signed)
Pt made an appointment. [place]

## 2022-04-21 ENCOUNTER — Encounter: Payer: Self-pay | Admitting: Nurse Practitioner

## 2022-04-21 ENCOUNTER — Ambulatory Visit: Payer: Medicaid Other | Admitting: Nurse Practitioner

## 2022-04-21 VITALS — BP 115/74 | HR 86 | Temp 98.4°F | Ht <= 58 in | Wt 135.0 lb

## 2022-04-21 DIAGNOSIS — M79675 Pain in left toe(s): Secondary | ICD-10-CM

## 2022-04-21 DIAGNOSIS — M79674 Pain in right toe(s): Secondary | ICD-10-CM | POA: Diagnosis not present

## 2022-04-21 DIAGNOSIS — E0842 Diabetes mellitus due to underlying condition with diabetic polyneuropathy: Secondary | ICD-10-CM | POA: Diagnosis not present

## 2022-04-21 MED ORDER — GABAPENTIN 100 MG PO CAPS: 100.0000 mg | ORAL_CAPSULE | Freq: Three times a day (TID) | ORAL | 3 refills | Status: DC

## 2022-04-21 NOTE — Assessment & Plan Note (Signed)
-   gabapentin (NEURONTIN) 100 MG capsule; Take 1 capsule (100 mg total) by mouth 3 (three) times daily.  Dispense: 90 capsule; Refill: 3  Diabetes is well controlled.  Follow-up as planned

## 2022-04-21 NOTE — Progress Notes (Signed)
Acute Office Visit  Subjective:     Patient ID: Betty Cain, female    DOB: 07/07/1975, 47 y.o.   MRN: [id number]  Chief Complaint  Patient presents with   Toe Pain    HPI Betty Cain with past medical history of diabetic neuropathy, diabetes melitis, paroxysmal super ventricular tachycardia, chronic migraine with intractable migraine, environmental allergies, anxiety presents with complaints of burning, tingling sensation to her toes bilaterally that started about 3 days ago .stated that she had a blister on her right toes that resolved after 1 day .she denies fever, chills, redness, numbness.  She has seen podiatrist in the past for bilateral foot pain.  Stated that she has never had these symptoms in the past but review of patient's chart showed a PMH of diabetic neuropathy.    Review of Systems  Constitutional: Negative.  Negative for chills, diaphoresis, fever, malaise/fatigue and weight loss.  Respiratory: Negative.  Negative for cough, hemoptysis and sputum production.   Cardiovascular: Negative.  Negative for chest pain, palpitations, orthopnea, claudication and leg swelling.  Musculoskeletal: Negative.  Negative for back pain, joint pain, myalgias and neck pain.  Skin:  Negative for itching and rash.  Neurological:  Positive for tingling and sensory change. Negative for dizziness, tremors, speech change, focal weakness, seizures, loss of consciousness and weakness.  Psychiatric/Behavioral: Negative.  Negative for depression, hallucinations, substance abuse and suicidal ideas.         Objective:    BP 115/74   Pulse 86   Temp 98.4 F (36.9 C)   Ht '4\' 10"'$  (1.473 m)   Wt 135 lb (61.2 kg)   SpO2 100%   BMI 28.22 kg/m    Physical Exam Constitutional:      General: She is not in acute distress.    Appearance: She is not ill-appearing, toxic-appearing or diaphoretic.  Eyes:     General: No scleral icterus.       Left eye: No discharge.  Cardiovascular:     Rate  and Rhythm: Normal rate and regular rhythm.     Pulses: Normal pulses.     Heart sounds: Normal heart sounds. No murmur heard.    No friction rub. No gallop.  Pulmonary:     Effort: Pulmonary effort is normal. No respiratory distress.     Breath sounds: Normal breath sounds. No stridor. No wheezing, rhonchi or rales.  Chest:     Chest wall: No tenderness.  Abdominal:     General: Abdomen is flat. There is no distension.     Palpations: Abdomen is soft. There is no mass.     Tenderness: There is no abdominal tenderness. There is no guarding.  Musculoskeletal:        General: No swelling, tenderness, deformity or signs of injury.     Right lower leg: No edema.     Left lower leg: No edema.     Comments: Has palpable pedal pulses, skin cool to touch, sensation intact on examination with monofilament.  No redness, swelling, rashes noted  Skin:    General: Skin is warm and dry.     Capillary Refill: Capillary refill takes less than 2 seconds.  Neurological:     Mental Status: She is alert and oriented to person, place, and time.     Cranial Nerves: No cranial nerve deficit.     Motor: No weakness.     Coordination: Coordination normal.     Gait: Gait normal.  Psychiatric:  Mood and Affect: Mood normal.        Behavior: Behavior normal.        Thought Content: Thought content normal.        Judgment: Judgment normal.     No results found for any visits on 04/21/22.      Assessment & Plan:   Problem List Items Addressed This Visit       Endocrine   Diabetic neuropathy ([place]) - Primary     - gabapentin (NEURONTIN) 100 MG capsule; Take 1 capsule (100 mg total) by mouth 3 (three) times daily.  Dispense: 90 capsule; Refill: 3  Diabetes is well controlled.  Follow-up as planned      Relevant Medications   gabapentin (NEURONTIN) 100 MG capsule    Meds ordered this encounter  Medications   gabapentin (NEURONTIN) 100 MG capsule    Sig: Take 1 capsule (100 mg total) by  mouth 3 (three) times daily.    Dispense:  90 capsule    Refill:  3    No follow-ups on file.  Renee Rival, FNP

## 2022-04-21 NOTE — Patient Instructions (Signed)
1. Neuropathy  - gabapentin (NEURONTIN) 100 MG capsule; Take 1 capsule (100 mg total) by mouth 3 (three) times daily.  Dispense: 90 capsule; Refill: 3   It is important that you exercise regularly at least 30 minutes 5 times a week as tolerated  Think about what you will eat, plan ahead. Choose " clean, green, fresh or frozen" over canned, processed or packaged foods which are more sugary, salty and fatty. 70 to 75% of food eaten should be vegetables and fruit. Three meals at set times with snacks allowed between meals, but they must be fruit or vegetables. Aim to eat over a 12 hour period , example 7 am to 7 pm, and STOP after  your last meal of the day. Drink water,generally about 64 ounces per day, no other drink is as healthy. Fruit juice is best enjoyed in a healthy way, by EATING the fruit.  Thanks for choosing [facility] we consider it a privelige to serve you.

## 2022-04-23 ENCOUNTER — Ambulatory Visit: Payer: Medicaid Other | Admitting: Physical Therapy

## 2022-04-26 ENCOUNTER — Encounter (INDEPENDENT_AMBULATORY_CARE_PROVIDER_SITE_OTHER): Payer: Self-pay

## 2022-04-26 ENCOUNTER — Ambulatory Visit (INDEPENDENT_AMBULATORY_CARE_PROVIDER_SITE_OTHER): Payer: Medicaid Other | Admitting: Adult Health

## 2022-04-26 ENCOUNTER — Encounter (INDEPENDENT_AMBULATORY_CARE_PROVIDER_SITE_OTHER): Payer: Self-pay | Admitting: Adult Health

## 2022-04-26 NOTE — Therapy (Unsigned)
OUTPATIENT PHYSICAL THERAPY THORACOLUMBAR EVALUATION   Patient Name: Betty Cain MRN: [id number] DOB:[birth year], 47 y.o., female Today's Date: 04/27/2022  END OF SESSION:  PT End of Session - 04/27/22 0936     Visit Number 1    Date for PT Re-Evaluation 07/20/22    PT Start Time 0840    PT Stop Time 0920    PT Time Calculation (min) 40 min    Activity Tolerance Patient limited by pain    Behavior During Therapy [facility] for tasks assessed/performed             Past Medical History:  Diagnosis Date   Allergy    Anemia    Anxiety 01/2019   Asthma    B12 deficiency    Back pain    Common migraine with intractable migraine 06/28/2017   Constipation    Diabetes ([place]) 02/2019   Fatigue    GERD (gastroesophageal reflux disease)    pos H pylori   Hypertension    controlled with medication   IBS (irritable bowel syndrome) 2005   Joint pain    Neonatal death    Vaginal delivery, full term-lived x2 hours.    Palpitations    Shortness of breath    Shortness of breath on exertion    Spinal headache    Swelling of both lower extremities    Valvular heart disease    Vitamin D deficiency 02/2019   Past Surgical History:  Procedure Laterality Date   ADENOIDECTOMY     and tonsils (as a child)   BIOPSY  08/27/2021   Procedure: BIOPSY;  Surgeon: Yetta Flock, MD;  Location: [facility];  Service: Gastroenterology;;   boil  2003   right elbow   CESAREAN SECTION     x4   CESAREAN SECTION  06/02/2011   Procedure: CESAREAN SECTION;  Surgeon: [name], MD;  Location: [facility];  Service: Gynecology;  Laterality: N/A;  Primary Cesarean Section Delivery Baby Boy @ 0004, Apgars 9/9   CESAREAN SECTION N/A 12/10/2013   Procedure: REPEAT CESAREAN SECTION;  Surgeon: [name], MD;  Location: [facility];  Service: Obstetrics;  Laterality: N/A;   CESAREAN SECTION     colonoscopy  11/23/2018   stated had issues with sleep when in for procedure   ESOPHAGOGASTRODUODENOSCOPY (EGD)  WITH PROPOFOL N/A 08/27/2021   Procedure: ESOPHAGOGASTRODUODENOSCOPY (EGD) WITH PROPOFOL;  Surgeon: Yetta Flock, MD;  Location: [facility];  Service: Gastroenterology;  Laterality: N/A;   OTHER SURGICAL HISTORY  2005   Uterine surgery    uterine cauterization     Patient Active Problem List   Diagnosis Date Noted   Type 2 diabetes mellitus with obesity ([place]) 03/25/2022   Hypotension - iatrogenic 03/18/2022   Other chronic pain 02/02/2022   Dyspepsia    Early satiety    IDA (iron deficiency anemia) 01/29/2021   Paroxysmal supraventricular tachycardia 10/01/2020   Class 1 obesity with serious comorbidity and body mass index (BMI) of 31.0 to 31.9 in adult 10/01/2020   Diabetes mellitus ([place]) 02/14/2020   Mixed diabetic hyperlipidemia associated with type 2 diabetes mellitus ([place]) 01/10/2020   Hypertension associated with diabetes ([place]) 01/10/2020   Diabetic neuropathy ([place]) 01/10/2020   Vitamin D deficiency 01/10/2020   Diabetes mellitus with proteinuria ([place]) 91/63/8466   History of Helicobacter pylori infection 08/08/2019   Proteinuria 06/14/2019   Elevated sed rate 05/23/2019   Elevated C-reactive protein (CRP) 05/23/2019   Diabetes mellitus without complication ([place]) 59/93/5701   Dyslipidemia (  high LDL; low HDL) 05/23/2019   Type 2 diabetes mellitus with hyperglycemia, without long-term current use of insulin ([place]) 02/17/2019   Hemoglobin A1C between 7% and 9% indicating borderline diabetic control ([place]) 02/17/2019   Breast cancer screening by mammogram 02/17/2019   Frequent headaches 02/14/2019   Anxiety 02/14/2019   Common migraine with intractable migraine 06/28/2017   Chest pain at rest 10/17/2016   Sinus tachycardia 08/18/2016   Prolonged Q-T interval on ECG 08/18/2016   Vertigo 07/22/2015   Abdominal discomfort 02/14/2015   Low back pain 02/14/2015   Abdominal pain 02/01/2015   Nausea without vomiting 02/01/2015   Environmental allergies 02/01/2015    Language barrier to communication 02/01/2015   Anemia 01/23/2015   Constipation 01/21/2015   Knee pain, bilateral 01/21/2015   Palpitations 09/18/2013   Shortness of breath 09/18/2013   Advanced maternal age in pregnancy in second trimester 06/19/2013   IBS (irritable bowel syndrome) 04/13/2003    PCP: Fenton Foy, NP  REFERRING PROVIDER: Fenton Foy, NP  REFERRING DIAG:  [phone] (ICD-10-CM) - Chronic right shoulder pain  M54.9,G89.29 (ICD-10-CM) - Chronic mid back pain  R42 (ICD-10-CM) - Vertigo    Rationale for Evaluation and Treatment: Rehabilitation  THERAPY DIAG:  Localized edema  Muscle weakness (generalized)  Other lack of coordination  Chronic right shoulder pain  Dizziness and giddiness  Other low back pain  ONSET DATE: 04/09/22  SUBJECTIVE:                                                                                                                                                                                           SUBJECTIVE STATEMENT: Patient reports R shoulder pain and back. She feels dizzy when she turns over while lying down. R shoulder pain is the most severe, travels down her arm.  PERTINENT HISTORY:  Patient presents today for a follow-up visit.  Patient does complain of right ear pain with some dizziness x 1 month.  Upon exam ears appear normal.  Patient does also complain of ongoing right shoulder pain and mid back pain.  This has been a chronic issue.  We will get x-rays today.  Will refer patient for physical therapy. Denies f/c/s, n/v/d, hemoptysis, PND, leg swelling  PAIN:  Are you having pain? Yes: NPRS scale: 9, shoulder, 8, back/10 Pain location: R shoulder into arm, mid back Pain description: unable to describe Aggravating factors: housework Relieving factors: none  PRECAUTIONS: None  WEIGHT BEARING RESTRICTIONS: No  FALLS:  Has patient fallen in last 6 months? No  LIVING ENVIRONMENT: Lives with: lives  with their family Lives in: House/apartment Stairs: No Has  following equipment at home: None  OCCUPATION: N/A  PLOF: Independent  PATIENT GOALS: Feel better  NEXT MD VISIT:   OBJECTIVE:   DIAGNOSTIC FINDINGS:  EXAM: RIGHT SHOULDER - 3 VIEWFINDINGS: There is no evidence of fracture or dislocation. There is no evidence of arthropathy or other focal bone abnormality. Soft tissues are unremarkable. THORACIC SPINE 3 VIEWS FINDINGS: There is no evidence of thoracic spine fracture. Alignment is normal. No other significant bone abnormalities are identified.   SCREENING FOR RED FLAGS: Bowel or bladder incontinence: No Spinal tumors: No Cauda equina syndrome: No Compression fracture: No Abdominal aneurysm: No  COGNITION: Overall cognitive status: Within functional limits for tasks assessed     SENSATION: Patient reports tingling and numbness in her toes, B.  MUSCLE LENGTH: Hamstrings: Right 75 deg; Left 65 deg   POSTURE: rounded shoulders, forward head, and flexed trunk   PALPATION: Tight and TTP on B cerv paraspinals, B up traps, R medial parascapular muscles, Ant cervical muscles, R > L, B pects, lower T spine and upper lumbar paraspinals  UE ROM: R shoulder P flex to 105, abd to 85 before it becomes painful  UE STRENGTH: R shoulder pain limits MMT  UE SPECIAL TESTS: R Hawkins, Neer, empty can test all (+), belly press test (-)  CERVICAL ROM: Limited by pain in all directions, mostly ext and B lateral flex  THORACIC MOBILITY: Limited due to pain.  LOWER EXTREMITY ROM: Mild tightness noted in R knee to chest  LOWER EXTREMITY STRENGTH: B hips and knee 3+/5  FUNCTIONAL ASSESSMENT: Quick DASH 71%  VESTIBULAR ASSESSMENT: TBD  GAIT: Distance walked: In clinic distances Assistive device utilized: None Level of assistance: Complete Independence Comments: WFL  TODAY'S TREATMENT:                                                                                                                               DATE:  04/27/22  Patient education  PATIENT EDUCATION:  Education details: POC, Plan to assess vestibular system Person educated: Patient and interpreter Education method: Explanation Education comprehension: verbalized understanding  HOME EXERCISE PROGRAM: [id number]  ASSESSMENT:  CLINICAL IMPRESSION: Patient is a 47 y.o. who was seen today for physical therapy evaluation and treatment for multiple issues including R shoulder and arm pain, mid/low back pain, vestibular symptoms. Her R shoulder pain is by far her biggest impairment currently, so evaluation focused primarily here. She has severe tightness and TTP in cervical paraspinals, B up traps R ant cervical muscles, R supraspinatus, B pects, R medial parascapular muscles. She is also tight and TTP in lower T and upper Lumbar paraspinals. Her pain impedes her mobility, functional activity, as well as strength, radiating into her R arm. All movements are strongly guarded due to fear of pain and pain. She reports dizziness with head turns when lying down, will further assess on next visit. She will benefit from PT to address acute pain and tension as well as  weakness and decreased trunk stability, to allow return to her previous level of function with minimal to no pain.  OBJECTIVE IMPAIRMENTS: decreased activity tolerance, decreased coordination, decreased mobility, decreased ROM, decreased strength, increased muscle spasms, impaired flexibility, impaired UE functional use, improper body mechanics, postural dysfunction, and pain.   ACTIVITY LIMITATIONS: carrying, lifting, bending, sitting, squatting, sleeping, bathing, dressing, reach over head, hygiene/grooming, and locomotion level  PARTICIPATION LIMITATIONS: meal prep, cleaning, laundry, and shopping  PERSONAL FACTORS: Past/current experiences are also affecting patient's functional outcome.   REHAB POTENTIAL: Good  CLINICAL DECISION MAKING:  Evolving/moderate complexity  EVALUATION COMPLEXITY: Moderate   GOALS: Goals reviewed with patient? Yes  SHORT TERM GOALS: Target date: 04/27/22  I with initial HEP Baseline: Goal status: INITIAL  LONG TERM GOALS: Target date: 07/20/22  I with final HEP Baseline:  Goal status: INITIAL  2.  Patient will be able to perform her normal daily activities including housekeeping, cooking, self car, with R shoulder pain < 4/10 Baseline:  Goal status: INITIAL  3.  Increase R shoulder strength to at least 4/5 Baseline:  Goal status: INITIAL  4.  Increase BLE strength to at least 4/5 Baseline:  Goal status: INITIAL  5.  Patient will report improvement in back pain by at least 50% Baseline:  Goal status: INITIAL  6.  Patient will report no dizziness while moving about throughout the day Baseline:  Goal status: INITIAL  PLAN:  PT FREQUENCY: 2x/week  PT DURATION: 12 weeks  PLANNED INTERVENTIONS: Therapeutic exercises, Therapeutic activity, Neuromuscular re-education, Balance training, Gait training, Patient/Family education, Self Care, Joint mobilization, Vestibular training, Canalith repositioning, Dry Needling, Electrical stimulation, Spinal mobilization, Cryotherapy, Moist heat, Taping, Vasopneumatic device, Traction, Ionotophoresis '4mg'$ /ml Dexamethasone, and Manual therapy.  PLAN FOR NEXT SESSION: Vestibular assessment   Marcelina Morel, DPT 04/27/2022, 10:03 AM

## 2022-04-27 ENCOUNTER — Encounter (INDEPENDENT_AMBULATORY_CARE_PROVIDER_SITE_OTHER): Payer: Self-pay | Admitting: Adult Health

## 2022-04-27 ENCOUNTER — Encounter: Payer: Self-pay | Admitting: Physical Therapy

## 2022-04-27 ENCOUNTER — Ambulatory Visit: Payer: Medicaid Other | Attending: Nurse Practitioner | Admitting: Physical Therapy

## 2022-04-27 ENCOUNTER — Ambulatory Visit (INDEPENDENT_AMBULATORY_CARE_PROVIDER_SITE_OTHER): Payer: Medicaid Other | Admitting: Adult Health

## 2022-04-27 VITALS — BP 108/74 | HR 73 | Temp 98.8°F | Ht <= 58 in | Wt 128.0 lb

## 2022-04-27 DIAGNOSIS — R278 Other lack of coordination: Secondary | ICD-10-CM | POA: Insufficient documentation

## 2022-04-27 DIAGNOSIS — E1169 Type 2 diabetes mellitus with other specified complication: Secondary | ICD-10-CM | POA: Diagnosis not present

## 2022-04-27 DIAGNOSIS — M5459 Other low back pain: Secondary | ICD-10-CM | POA: Diagnosis not present

## 2022-04-27 DIAGNOSIS — Z6826 Body mass index (BMI) 26.0-26.9, adult: Secondary | ICD-10-CM | POA: Diagnosis not present

## 2022-04-27 DIAGNOSIS — R600 Localized edema: Secondary | ICD-10-CM | POA: Insufficient documentation

## 2022-04-27 DIAGNOSIS — R42 Dizziness and giddiness: Secondary | ICD-10-CM | POA: Insufficient documentation

## 2022-04-27 DIAGNOSIS — E669 Obesity, unspecified: Secondary | ICD-10-CM

## 2022-04-27 DIAGNOSIS — M6281 Muscle weakness (generalized): Secondary | ICD-10-CM | POA: Diagnosis not present

## 2022-04-27 DIAGNOSIS — M549 Dorsalgia, unspecified: Secondary | ICD-10-CM | POA: Diagnosis not present

## 2022-04-27 DIAGNOSIS — M25511 Pain in right shoulder: Secondary | ICD-10-CM | POA: Diagnosis not present

## 2022-04-27 DIAGNOSIS — G8929 Other chronic pain: Secondary | ICD-10-CM | POA: Insufficient documentation

## 2022-04-27 DIAGNOSIS — Z7985 Long-term (current) use of injectable non-insulin antidiabetic drugs: Secondary | ICD-10-CM | POA: Diagnosis not present

## 2022-04-27 MED ORDER — OZEMPIC (0.25 OR 0.5 MG/DOSE) 2 MG/1.5ML ~~LOC~~ SOPN: 0.5000 mg | PEN_INJECTOR | SUBCUTANEOUS | 0 refills | Status: DC

## 2022-05-04 NOTE — Therapy (Signed)
OUTPATIENT PHYSICAL THERAPY THORACOLUMBAR EVALUATION   Patient Name: Betty Cain MRN: [id number] DOB:1975/08/04, 47 y.o., female Today's Date: 05/04/2022  END OF SESSION:    Past Medical History:  Diagnosis Date   Allergy    Anemia    Anxiety 01/2019   Asthma    B12 deficiency    Back pain    Common migraine with intractable migraine 06/28/2017   Constipation    Diabetes ([place]) 02/2019   Fatigue    GERD (gastroesophageal reflux disease)    pos H pylori   Hypertension    controlled with medication   IBS (irritable bowel syndrome) 2005   Joint pain    Neonatal death    Vaginal delivery, full term-lived x2 hours.    Palpitations    Shortness of breath    Shortness of breath on exertion    Spinal headache    Swelling of both lower extremities    Valvular heart disease    Vitamin D deficiency 02/2019   Past Surgical History:  Procedure Laterality Date   ADENOIDECTOMY     and tonsils (as a child)   BIOPSY  08/27/2021   Procedure: BIOPSY;  Surgeon: Yetta Flock, MD;  [facility];  Service: Gastroenterology;;   boil  2003   right elbow   CESAREAN SECTION     x4   CESAREAN SECTION  06/02/2011   Procedure: CESAREAN SECTION;  Surgeon: [name], MD;  Location: [facility];  Service: Gynecology;  Laterality: N/A;  Primary Cesarean Section Delivery Baby Boy @ 0004, Apgars 9/9   CESAREAN SECTION N/A 12/10/2013   Procedure: REPEAT CESAREAN SECTION;  Surgeon: [name], MD;  Location: [facility];  Service: Obstetrics;  Laterality: N/A;   CESAREAN SECTION     colonoscopy  11/23/2018   stated had issues with sleep when in for procedure   ESOPHAGOGASTRODUODENOSCOPY (EGD) WITH PROPOFOL N/A 08/27/2021   Procedure: ESOPHAGOGASTRODUODENOSCOPY (EGD) WITH PROPOFOL;  Surgeon: Yetta Flock, MD;  [facility];  Service: Gastroenterology;  Laterality: N/A;   OTHER SURGICAL HISTORY  2005   Uterine surgery    uterine cauterization     Patient Active  Problem List   Diagnosis Date Noted   Type 2 diabetes mellitus with obesity ([place]) 03/25/2022   Hypotension - iatrogenic 03/18/2022   Other chronic pain 02/02/2022   Dyspepsia    Early satiety    IDA (iron deficiency anemia) 01/29/2021   Paroxysmal supraventricular tachycardia 10/01/2020   Class 1 obesity with serious comorbidity and body mass index (BMI) of 31.0 to 31.9 in adult 10/01/2020   Diabetes mellitus ([place]) 02/14/2020   Mixed diabetic hyperlipidemia associated with type 2 diabetes mellitus ([place]) 01/10/2020   Hypertension associated with diabetes ([place]) 01/10/2020   Diabetic neuropathy ([place]) 01/10/2020   Vitamin D deficiency 01/10/2020   Diabetes mellitus with proteinuria ([place]) 78/93/8101   History of Helicobacter pylori infection 08/08/2019   Proteinuria 06/14/2019   Elevated sed rate 05/23/2019   Elevated C-reactive protein (CRP) 05/23/2019   Diabetes mellitus without complication ([place]) 75/01/2584   Dyslipidemia (high LDL; low HDL) 05/23/2019   Type 2 diabetes mellitus with hyperglycemia, without long-term current use of insulin ([place]) 02/17/2019   Hemoglobin A1C between 7% and 9% indicating borderline diabetic control ([place]) 02/17/2019   Breast cancer screening by mammogram 02/17/2019   Frequent headaches 02/14/2019   Anxiety 02/14/2019   Common migraine with intractable migraine 06/28/2017   Chest pain at rest 10/17/2016   Sinus tachycardia 08/18/2016  Prolonged Q-T interval on ECG 08/18/2016   Vertigo 07/22/2015   Abdominal discomfort 02/14/2015   Low back pain 02/14/2015   Abdominal pain 02/01/2015   Nausea without vomiting 02/01/2015   Environmental allergies 02/01/2015   Language barrier to communication 02/01/2015   Anemia 01/23/2015   Constipation 01/21/2015   Knee pain, bilateral 01/21/2015   Palpitations 09/18/2013   Shortness of breath 09/18/2013   Advanced maternal age in pregnancy in second trimester 06/19/2013   IBS (irritable bowel syndrome)  04/13/2003    PCP: Fenton Foy, NP  REFERRING PROVIDER: Fenton Foy, NP  REFERRING DIAG:  [phone] (ICD-10-CM) - Chronic right shoulder pain  M54.9,G89.29 (ICD-10-CM) - Chronic mid back pain  R42 (ICD-10-CM) - Vertigo    Rationale for Evaluation and Treatment: Rehabilitation  THERAPY DIAG:  No diagnosis found.  ONSET DATE: 04/09/22  SUBJECTIVE:                                                                                                                                                                                           SUBJECTIVE STATEMENT: Patient reports R shoulder pain and back. She feels dizzy when she turns over while lying down. R shoulder pain is the most severe, travels down her arm.  PERTINENT HISTORY:  Patient presents today for a follow-up visit.  Patient does complain of right ear pain with some dizziness x 1 month.  Upon exam ears appear normal.  Patient does also complain of ongoing right shoulder pain and mid back pain.  This has been a chronic issue.  We will get x-rays today.  Will refer patient for physical therapy. Denies f/c/s, n/v/d, hemoptysis, PND, leg swelling  PAIN:  Are you having pain? Yes: NPRS scale: 9, shoulder, 8, back/10 Pain location: R shoulder into arm, mid back Pain description: unable to describe Aggravating factors: housework Relieving factors: none  PRECAUTIONS: None  WEIGHT BEARING RESTRICTIONS: No  FALLS:  Has patient fallen in last 6 months? No  LIVING ENVIRONMENT: Lives with: lives with their family Lives in: House/apartment Stairs: No Has following equipment at home: None  OCCUPATION: N/A  PLOF: Independent  PATIENT GOALS: Feel better  NEXT MD VISIT:   OBJECTIVE:   DIAGNOSTIC FINDINGS:  EXAM: RIGHT SHOULDER - 3 VIEWFINDINGS: There is no evidence of fracture or dislocation. There is no evidence of arthropathy or other focal bone abnormality. Soft tissues are unremarkable. THORACIC SPINE  3 VIEWS FINDINGS: There is no evidence of thoracic spine fracture. Alignment is normal. No other significant bone abnormalities are identified.   SCREENING FOR RED FLAGS: Bowel or bladder incontinence: No Spinal tumors: No Cauda equina  syndrome: No Compression fracture: No Abdominal aneurysm: No  COGNITION: Overall cognitive status: Within functional limits for tasks assessed     SENSATION: Patient reports tingling and numbness in her toes, B.  MUSCLE LENGTH: Hamstrings: Right 75 deg; Left 65 deg   POSTURE: rounded shoulders, forward head, and flexed trunk   PALPATION: Tight and TTP on B cerv paraspinals, B up traps, R medial parascapular muscles, Ant cervical muscles, R > L, B pects, lower T spine and upper lumbar paraspinals  UE ROM: R shoulder P flex to 105, abd to 85 before it becomes painful  UE STRENGTH: R shoulder pain limits MMT  UE SPECIAL TESTS: R Hawkins, Neer, empty can test all (+), belly press test (-)  CERVICAL ROM: Limited by pain in all directions, mostly ext and B lateral flex  THORACIC MOBILITY: Limited due to pain.  LOWER EXTREMITY ROM: Mild tightness noted in R knee to chest  LOWER EXTREMITY STRENGTH: B hips and knee 3+/5  FUNCTIONAL ASSESSMENT: Quick DASH 71%  VESTIBULAR ASSESSMENT: TBD  GAIT: Distance walked: In clinic distances Assistive device utilized: None Level of assistance: Complete Independence Comments: WFL  TODAY'S TREATMENT:                                                                                                                              DATE:  05/06/22 UBE [place] VOR exercises x1 and x2 Standing rows and ext  STM to c-spine and neck area [facility]   04/27/22  Patient education  PATIENT EDUCATION:  Education details: POC, Plan to assess vestibular system Person educated: Patient and interpreter Education method: Explanation Education comprehension: verbalized understanding  HOME EXERCISE  PROGRAM: [id number]  ASSESSMENT:  CLINICAL IMPRESSION: Patient is a 47 y.o. who was seen today for physical therapy evaluation and treatment for multiple issues including R shoulder and arm pain, mid/low back pain, vestibular symptoms. Her R shoulder pain is by far her biggest impairment currently, so evaluation focused primarily here. She has severe tightness and TTP in cervical paraspinals, B up traps R ant cervical muscles, R supraspinatus, B pects, R medial parascapular muscles. She is also tight and TTP in lower T and upper Lumbar paraspinals. Her pain impedes her mobility, functional activity, as well as strength, radiating into her R arm. All movements are strongly guarded due to fear of pain and pain. She reports dizziness with head turns when lying down, will further assess on next visit. She will benefit from PT to address acute pain and tension as well as weakness and decreased trunk stability, to allow return to her previous level of function with minimal to no pain.  OBJECTIVE IMPAIRMENTS: decreased activity tolerance, decreased coordination, decreased mobility, decreased ROM, decreased strength, increased muscle spasms, impaired flexibility, impaired UE functional use, improper body mechanics, postural dysfunction, and pain.   ACTIVITY LIMITATIONS: carrying, lifting, bending, sitting, squatting, sleeping, bathing, dressing, reach over head, hygiene/grooming, and locomotion level  PARTICIPATION LIMITATIONS: meal prep,  cleaning, laundry, and shopping  PERSONAL FACTORS: Past/current experiences are also affecting patient's functional outcome.   REHAB POTENTIAL: Good  CLINICAL DECISION MAKING: Evolving/moderate complexity  EVALUATION COMPLEXITY: Moderate   GOALS: Goals reviewed with patient? Yes  SHORT TERM GOALS: Target date: 04/27/22  I with initial HEP Baseline: Goal status: INITIAL  LONG TERM GOALS: Target date: 07/20/22  I with final HEP Baseline:  Goal status:  INITIAL  2.  Patient will be able to perform her normal daily activities including housekeeping, cooking, self car, with R shoulder pain < 4/10 Baseline:  Goal status: INITIAL  3.  Increase R shoulder strength to at least 4/5 Baseline:  Goal status: INITIAL  4.  Increase BLE strength to at least 4/5 Baseline:  Goal status: INITIAL  5.  Patient will report improvement in back pain by at least 50% Baseline:  Goal status: INITIAL  6.  Patient will report no dizziness while moving about throughout the day Baseline:  Goal status: INITIAL  PLAN:  PT FREQUENCY: 2x/week  PT DURATION: 12 weeks  PLANNED INTERVENTIONS: Therapeutic exercises, Therapeutic activity, Neuromuscular re-education, Balance training, Gait training, Patient/Family education, Self Care, Joint mobilization, Vestibular training, Canalith repositioning, Dry Needling, Electrical stimulation, Spinal mobilization, Cryotherapy, Moist heat, Taping, Vasopneumatic device, Traction, Ionotophoresis '4mg'$ /ml Dexamethasone, and Manual therapy.  PLAN FOR NEXT SESSION: Vestibular assessment   Marcelina Morel, DPT 05/04/2022, 9:51 AM

## 2022-05-04 NOTE — Progress Notes (Signed)
Chief Complaint:   OBESITY Betty Cain is here to discuss her progress with her obesity treatment plan along with follow-up of her obesity related diagnoses. Betty Cain is on the Category 2 Plan and states she is following her eating plan approximately 60% of the time. Betty Cain states she is walking 15 minutes 7 times per week.  Today's visit was #: 13 Starting weight: 152 LBS Starting date: 01/10/2020 Today's weight: 128 LBS Today's date: 04/27/2022 Total lbs lost to date: 24 lbs Total lbs lost since last in-office visit: +2 lbs  Interim History:  Betty Cain enjoys hot tea each morning. She will add the following her hot tea: cup whole milk. She is unable tolerate Fair Life milk. She will also have "Maria" cookies 3-5 with her hot tea.  She will enjoy Green Tea in afternoon as a snack.  She provided the following recall of her typical lunchs and dinners- Lunch: Either chicken or [place] with various vegetables. Dinner: [employer] protein with either 1-2 servings of vegetables or fruits.  Of Note- [place] [facility].  Subjective:   1. Type 2 diabetes mellitus with obesity (HCC) Lab Results  Component Value Date   HGBA1C 5.1 04/09/2022   HGBA1C 4.9 12/28/2021   HGBA1C 5.0 08/19/2021   HGBA1C 5.0 08/19/2021   HGBA1C 5.0 (A) 08/19/2021   HGBA1C 5.0 08/19/2021    Patient is currently on Ozempic 0.5 mg weekly.   Patient is positive for constipation and positive for mild abdominal aching after larger meals.   Fasting blood sugars 84-139. She denies sx's of hypoglycemia. She denies any other SE of GLP-1 therapy.  Assessment/Plan:   1. Type 2 diabetes mellitus with obesity (HCC) Refill- Semaglutide,0.25 or 0.'5MG'$ /DOS, (OZEMPIC, 0.25 OR 0.5 MG/DOSE,) 2 MG/1.5ML SOPN; Inject 0.5 mg into the skin once a week.  Dispense: 3 mL; Refill: 0  2. Obesity, current BMI 26.8 Betty Cain is currently in the action stage of change. As such, her goal is to continue with weight loss efforts. She has agreed  to the Category 2 Plan.   1) Limit "Maria" cookies to max 3 each morning. 2) Increase plain water to least 10 oz/day.  Exercise goals:  As is.  Behavioral modification strategies: increasing lean protein intake, decreasing simple carbohydrates, meal planning and cooking strategies, keeping healthy foods in the home, dealing with family or coworker sabotage, and planning for success.  Betty Cain has agreed to follow-up with our clinic in 4 weeks. She was informed of the importance of frequent follow-up visits to maximize her success with intensive lifestyle modifications for her multiple health conditions.   Objective:   Blood pressure 108/74, pulse 73, temperature 98.8 F (37.1 C), height '4\' 10"'$  (1.473 m), weight 128 lb (58.1 kg), SpO2 99 %. Body mass index is 26.75 kg/m.  General: Cooperative, alert, well developed, in no acute distress. HEENT: Conjunctivae and lids unremarkable. Cardiovascular: Regular rhythm.  Lungs: Normal work of breathing. Neurologic: No focal deficits.   Lab Results  Component Value Date   CREATININE 0.64 04/09/2022   BUN 23 04/09/2022   NA 144 04/09/2022   K 4.4 04/09/2022   CL 105 04/09/2022   CO2 21 04/09/2022   Lab Results  Component Value Date   ALT 19 04/09/2022   AST 19 04/09/2022   ALKPHOS 92 04/09/2022   BILITOT 0.4 04/09/2022   Lab Results  Component Value Date   HGBA1C 5.1 04/09/2022   HGBA1C 4.9 12/28/2021   HGBA1C 5.0 08/19/2021   HGBA1C  5.0 08/19/2021   HGBA1C 5.0 (A) 08/19/2021   HGBA1C 5.0 08/19/2021   Lab Results  Component Value Date   INSULIN 3.8 12/28/2021   INSULIN 8.7 01/10/2020   Lab Results  Component Value Date   TSH 2.530 12/28/2021   Lab Results  Component Value Date   CHOL 117 04/09/2022   HDL 44 04/09/2022   LDLCALC 58 04/09/2022   TRIG 75 04/09/2022   CHOLHDL 2.7 04/09/2022   Lab Results  Component Value Date   VD25OH 35.6 12/28/2021   VD25OH 22.7 (L) 10/09/2021   VD25OH 30.0 01/01/2021   Lab  Results  Component Value Date   WBC 8.7 04/09/2022   HGB 12.5 04/09/2022   HCT 37.7 04/09/2022   MCV 91 04/09/2022   PLT 287 04/09/2022   Lab Results  Component Value Date   IRON 83 05/19/2021   TIBC 230 (L) 05/19/2021   FERRITIN 478 (H) 05/19/2021   Attestation Statements:   Reviewed by clinician on day of visit: allergies, medications, problem list, medical history, surgical history, family history, social history, and previous encounter notes.  I, Davy Pique, RMA, am acting as Location manager for Mina Marble, NP.  I have reviewed the above documentation for accuracy and completeness, and I agree with the above. -  Carols Clemence d. Shamia Uppal, NP-C

## 2022-05-05 NOTE — Progress Notes (Signed)
Pt not seen.

## 2022-05-06 ENCOUNTER — Ambulatory Visit: Payer: Medicaid Other

## 2022-05-06 DIAGNOSIS — M549 Dorsalgia, unspecified: Secondary | ICD-10-CM | POA: Diagnosis not present

## 2022-05-06 DIAGNOSIS — M25511 Pain in right shoulder: Secondary | ICD-10-CM | POA: Diagnosis not present

## 2022-05-06 DIAGNOSIS — G8929 Other chronic pain: Secondary | ICD-10-CM | POA: Diagnosis not present

## 2022-05-06 DIAGNOSIS — R278 Other lack of coordination: Secondary | ICD-10-CM

## 2022-05-06 DIAGNOSIS — R600 Localized edema: Secondary | ICD-10-CM | POA: Diagnosis not present

## 2022-05-06 DIAGNOSIS — R42 Dizziness and giddiness: Secondary | ICD-10-CM

## 2022-05-06 DIAGNOSIS — M5459 Other low back pain: Secondary | ICD-10-CM

## 2022-05-06 DIAGNOSIS — M6281 Muscle weakness (generalized): Secondary | ICD-10-CM

## 2022-05-12 ENCOUNTER — Encounter: Payer: Self-pay | Admitting: Physical Therapy

## 2022-05-12 ENCOUNTER — Ambulatory Visit: Payer: Medicaid Other | Admitting: Physical Therapy

## 2022-05-12 DIAGNOSIS — R278 Other lack of coordination: Secondary | ICD-10-CM

## 2022-05-12 DIAGNOSIS — G8929 Other chronic pain: Secondary | ICD-10-CM

## 2022-05-12 DIAGNOSIS — R600 Localized edema: Secondary | ICD-10-CM | POA: Diagnosis not present

## 2022-05-12 DIAGNOSIS — M25511 Pain in right shoulder: Secondary | ICD-10-CM | POA: Diagnosis not present

## 2022-05-12 DIAGNOSIS — M5459 Other low back pain: Secondary | ICD-10-CM

## 2022-05-12 DIAGNOSIS — M6281 Muscle weakness (generalized): Secondary | ICD-10-CM

## 2022-05-12 DIAGNOSIS — M549 Dorsalgia, unspecified: Secondary | ICD-10-CM | POA: Diagnosis not present

## 2022-05-12 DIAGNOSIS — R42 Dizziness and giddiness: Secondary | ICD-10-CM | POA: Diagnosis not present

## 2022-05-12 NOTE — Therapy (Signed)
OUTPATIENT PHYSICAL THERAPY THORACOLUMBAR EVALUATION   Patient Name: Betty Cain MRN: [id number] DOB:1975-07-20, 47 y.o., female Today's Date: 05/12/2022  END OF SESSION:  PT End of Session - 05/12/22 0953     Visit Number 3    Date for PT Re-Evaluation 07/20/22    PT Start Time 0928    PT Stop Time 1010    PT Time Calculation (min) 42 min    Activity Tolerance Patient limited by pain    Behavior During Therapy [facility] for tasks assessed/performed              Past Medical History:  Diagnosis Date   Allergy    Anemia    Anxiety 01/2019   Asthma    B12 deficiency    Back pain    Common migraine with intractable migraine 06/28/2017   Constipation    Diabetes ([place]) 02/2019   Fatigue    GERD (gastroesophageal reflux disease)    pos H pylori   Hypertension    controlled with medication   IBS (irritable bowel syndrome) 2005   Joint pain    Neonatal death    Vaginal delivery, full term-lived x2 hours.    Palpitations    Shortness of breath    Shortness of breath on exertion    Spinal headache    Swelling of both lower extremities    Valvular heart disease    Vitamin D deficiency 02/2019   Past Surgical History:  Procedure Laterality Date   ADENOIDECTOMY     and tonsils (as a child)   BIOPSY  08/27/2021   Procedure: BIOPSY;  Surgeon: Yetta Flock, MD;  Location: [facility];  Service: Gastroenterology;;   boil  2003   right elbow   CESAREAN SECTION     x4   CESAREAN SECTION  06/02/2011   Procedure: CESAREAN SECTION;  Surgeon: [name], MD;  Location: [facility];  Service: Gynecology;  Laterality: N/A;  Primary Cesarean Section Delivery Baby Boy @ 0004, Apgars 9/9   CESAREAN SECTION N/A 12/10/2013   Procedure: REPEAT CESAREAN SECTION;  Surgeon: [name], MD;  Location: [facility];  Service: Obstetrics;  Laterality: N/A;   CESAREAN SECTION     colonoscopy  11/23/2018   stated had issues with sleep when in for procedure   ESOPHAGOGASTRODUODENOSCOPY  (EGD) WITH PROPOFOL N/A 08/27/2021   Procedure: ESOPHAGOGASTRODUODENOSCOPY (EGD) WITH PROPOFOL;  Surgeon: Yetta Flock, MD;  Location: [facility];  Service: Gastroenterology;  Laterality: N/A;   OTHER SURGICAL HISTORY  2005   Uterine surgery    uterine cauterization     Patient Active Problem List   Diagnosis Date Noted   Type 2 diabetes mellitus with obesity ([place]) 03/25/2022   Hypotension - iatrogenic 03/18/2022   Other chronic pain 02/02/2022   Dyspepsia    Early satiety    IDA (iron deficiency anemia) 01/29/2021   Paroxysmal supraventricular tachycardia 10/01/2020   Class 1 obesity with serious comorbidity and body mass index (BMI) of 31.0 to 31.9 in adult 10/01/2020   Diabetes mellitus ([place]) 02/14/2020   Mixed diabetic hyperlipidemia associated with type 2 diabetes mellitus ([place]) 01/10/2020   Hypertension associated with diabetes ([place]) 01/10/2020   Diabetic neuropathy ([place]) 01/10/2020   Vitamin D deficiency 01/10/2020   Diabetes mellitus with proteinuria ([place]) 25/95/6387   History of Helicobacter pylori infection 08/08/2019   Proteinuria 06/14/2019   Elevated sed rate 05/23/2019   Elevated C-reactive protein (CRP) 05/23/2019   Diabetes mellitus without complication ([place]) 56/43/3295  Dyslipidemia (high LDL; low HDL) 05/23/2019   Type 2 diabetes mellitus with hyperglycemia, without long-term current use of insulin ([place]) 02/17/2019   Hemoglobin A1C between 7% and 9% indicating borderline diabetic control ([place]) 02/17/2019   Breast cancer screening by mammogram 02/17/2019   Frequent headaches 02/14/2019   Anxiety 02/14/2019   Common migraine with intractable migraine 06/28/2017   Chest pain at rest 10/17/2016   Sinus tachycardia 08/18/2016   Prolonged Q-T interval on ECG 08/18/2016   Vertigo 07/22/2015   Abdominal discomfort 02/14/2015   Low back pain 02/14/2015   Abdominal pain 02/01/2015   Nausea without vomiting 02/01/2015   Environmental allergies 02/01/2015    Language barrier to communication 02/01/2015   Anemia 01/23/2015   Constipation 01/21/2015   Knee pain, bilateral 01/21/2015   Palpitations 09/18/2013   Shortness of breath 09/18/2013   Advanced maternal age in pregnancy in second trimester 06/19/2013   IBS (irritable bowel syndrome) 04/13/2003    PCP: Fenton Foy, NP  REFERRING PROVIDER: Fenton Foy, NP  REFERRING DIAG:  [phone] (ICD-10-CM) - Chronic right shoulder pain  M54.9,G89.29 (ICD-10-CM) - Chronic mid back pain  R42 (ICD-10-CM) - Vertigo    Rationale for Evaluation and Treatment: Rehabilitation  THERAPY DIAG:  Chronic right shoulder pain  Other low back pain  Muscle weakness (generalized)  Dizziness and giddiness  Other lack of coordination  ONSET DATE: 04/09/22  SUBJECTIVE:                                                                                                                                                                                           SUBJECTIVE STATEMENT: Patient reports that her shoulder is feeling ok, but she still cannot reach up over her head. Her dizziness has been bad. She feels it first thing in the morning and also sometimes when lying still. Worst when she moves from sit to supine, but sometimes upon first standing.  PERTINENT HISTORY:  Patient presents today for a follow-up visit.  Patient does complain of right ear pain with some dizziness x 1 month.  Upon exam ears appear normal.  Patient does also complain of ongoing right shoulder pain and mid back pain.  This has been a chronic issue.  We will get x-rays today.  Will refer patient for physical therapy. Denies f/c/s, n/v/d, hemoptysis, PND, leg swelling  PAIN:  Are you having pain? Yes: NPRS scale: 9, shoulder, 8, back/10 Pain location: R shoulder into arm, mid back Pain description: unable to describe Aggravating factors: housework Relieving factors: none  PRECAUTIONS: None  WEIGHT BEARING  RESTRICTIONS: No  FALLS:  Has patient fallen in  last 6 months? No  LIVING ENVIRONMENT: Lives with: lives with their family Lives in: House/apartment Stairs: No Has following equipment at home: None  OCCUPATION: N/A  PLOF: Independent  PATIENT GOALS: Feel better  NEXT MD VISIT:   OBJECTIVE:   DIAGNOSTIC FINDINGS:  EXAM: RIGHT SHOULDER - 3 VIEWFINDINGS: There is no evidence of fracture or dislocation. There is no evidence of arthropathy or other focal bone abnormality. Soft tissues are unremarkable. THORACIC SPINE 3 VIEWS FINDINGS: There is no evidence of thoracic spine fracture. Alignment is normal. No other significant bone abnormalities are identified.   SCREENING FOR RED FLAGS: Bowel or bladder incontinence: No Spinal tumors: No Cauda equina syndrome: No Compression fracture: No Abdominal aneurysm: No  COGNITION: Overall cognitive status: Within functional limits for tasks assessed     SENSATION: Patient reports tingling and numbness in her toes, B.  MUSCLE LENGTH: Hamstrings: Right 75 deg; Left 65 deg   POSTURE: rounded shoulders, forward head, and flexed trunk   PALPATION: Tight and TTP on B cerv paraspinals, B up traps, R medial parascapular muscles, Ant cervical muscles, R > L, B pects, lower T spine and upper lumbar paraspinals  UE ROM: R shoulder P flex to 105, abd to 85 before it becomes painful  UE STRENGTH: R shoulder pain limits MMT  UE SPECIAL TESTS: R Hawkins, Neer, empty can test all (+), belly press test (-)  CERVICAL ROM: Limited by pain in all directions, mostly ext and B lateral flex  THORACIC MOBILITY: Limited due to pain.  LOWER EXTREMITY ROM: Mild tightness noted in R knee to chest  LOWER EXTREMITY STRENGTH: B hips and knee 3+/5  FUNCTIONAL ASSESSMENT: Quick DASH 71%  VESTIBULAR ASSESSMENT: TBD  GAIT: Distance walked: In clinic distances Assistive device utilized: None Level of assistance: Complete  Independence Comments: WFL  TODAY'S TREATMENT:                                                                                                                              DATE:  05/12/22 Vestibular assessment- Observation- eyes aligned, tracking WNL Saccades- increased dizziness reported after 6 reps, Convergence-Dizziness reported at 15" VOR-(+) horizontal, (-) vertical VOR cancellation (-) horizontal, deferred Vert due to fatigue.  Orthostatic BPs Supine- 99/79 HR 81BPM Sitting- 117/86 HR 82 BPM, 3 minutes- 102/85 HR81 Stand- 98/82 HR 84, 3 minutes- 102/86 HR 90  BPPV Dix-Hallpike (-) R, (+) L Performed L Epley maneuver with residual dizziness upon sitting.   UBE L1 x41mns forwards, unable to go backwards Standing rows red 2x10 Standing ext yellow 2x10 ER scaption with red 2x10 Shoulder flexion 2# WaTE 2x10  Shoulder ext with 2# WaTE 2x10 STM to c-spine and neck [facility]  Patient education  PATIENT EDUCATION:  Education details: POC, Plan to assess vestibular system Person educated: Patient and interpreter Education method: Explanation Education comprehension: verbalized understanding  HOME EXERCISE PROGRAM: [id number] ASSESSMENT:  CLINICAL IMPRESSION: Patient reports  That her R shoulder continues to hurt when moved > 90 of elevation. She also reports increased dizziness. Performed vestibular assessment today including occulo-motor control, Orthostatic Bps, and BPPV testing. She does seem to have multiple triggers, including horizontal VOR, some orthostatic hypotension, and L BPPV. Educated her to move slowly when going from sup > sit or sit to stand, allowing her wave of dizziness to subside before attempting further movement. Performed L Epley with positive response, residual dizziness. Unable to address shoulder updates or update HEP for vestibular issues due to the length of testing and residual dizziness.  OBJECTIVE IMPAIRMENTS: decreased  activity tolerance, decreased coordination, decreased mobility, decreased ROM, decreased strength, increased muscle spasms, impaired flexibility, impaired UE functional use, improper body mechanics, postural dysfunction, and pain.   ACTIVITY LIMITATIONS: carrying, lifting, bending, sitting, squatting, sleeping, bathing, dressing, reach over head, hygiene/grooming, and locomotion level  PARTICIPATION LIMITATIONS: meal prep, cleaning, laundry, and shopping  PERSONAL FACTORS: Past/current experiences are also affecting patient's functional outcome.   REHAB POTENTIAL: Good  CLINICAL DECISION MAKING: Evolving/moderate complexity  EVALUATION COMPLEXITY: Moderate   GOALS: Goals reviewed with patient? Yes  SHORT TERM GOALS: Target date: 04/27/22  I with initial HEP Baseline: Goal status: ongoing  LONG TERM GOALS: Target date: 07/20/22  I with final HEP Baseline:  Goal status: INITIAL  2.  Patient will be able to perform her normal daily activities including housekeeping, cooking, self car, with R shoulder pain < 4/10 Baseline:  Goal status: INITIAL  3.  Increase R shoulder strength to at least 4/5 Baseline:  Goal status: INITIAL  4.  Increase BLE strength to at least 4/5 Baseline:  Goal status: ongoing  5.  Patient will report improvement in back pain by at least 50% Baseline:  Goal status: INITIAL  6.  Patient will report no dizziness while moving about throughout the day Baseline:  Goal status: INITIAL  PLAN:  PT FREQUENCY: 2x/week  PT DURATION: 12 weeks  PLANNED INTERVENTIONS: Therapeutic exercises, Therapeutic activity, Neuromuscular re-education, Balance training, Gait training, Patient/Family education, Self Care, Joint mobilization, Vestibular training, Canalith repositioning, Dry Needling, Electrical stimulation, Spinal mobilization, Cryotherapy, Moist heat, Taping, Vasopneumatic device, Traction, Ionotophoresis '4mg'$ /ml Dexamethasone, and Manual therapy.  PLAN  FOR NEXT SESSION: Update HEP for shoulder mobilization and strength, vestibular re-ed with VOR, re-assess L horizontal canal.  Ethel Rana, PT/DPT

## 2022-05-13 NOTE — Therapy (Signed)
OUTPATIENT PHYSICAL THERAPY THORACOLUMBAR EVALUATION   Patient Name: Betty Cain MRN: [id number] DOB:[birth year], 47 y.o., female Today's Date: 05/14/2022  END OF SESSION:  PT End of Session - 05/14/22 1004     Visit Number 4    Date for PT Re-Evaluation 07/20/22    PT Start Time 1005    PT Stop Time 1052    PT Time Calculation (min) 47 min    Activity Tolerance Patient limited by pain    Behavior During Therapy [facility] for tasks assessed/performed               Past Medical History:  Diagnosis Date   Allergy    Anemia    Anxiety 01/2019   Asthma    B12 deficiency    Back pain    Common migraine with intractable migraine 06/28/2017   Constipation    Diabetes ([place]) 02/2019   Fatigue    GERD (gastroesophageal reflux disease)    pos H pylori   Hypertension    controlled with medication   IBS (irritable bowel syndrome) 2005   Joint pain    Neonatal death    Vaginal delivery, full term-lived x2 hours.    Palpitations    Shortness of breath    Shortness of breath on exertion    Spinal headache    Swelling of both lower extremities    Valvular heart disease    Vitamin D deficiency 02/2019   Past Surgical History:  Procedure Laterality Date   ADENOIDECTOMY     and tonsils (as a child)   BIOPSY  08/27/2021   Procedure: BIOPSY;  Surgeon: Yetta Flock, MD;  Location: [facility];  Service: Gastroenterology;;   boil  2003   right elbow   CESAREAN SECTION     x4   CESAREAN SECTION  06/02/2011   Procedure: CESAREAN SECTION;  Surgeon: [name], MD;  Location: [facility];  Service: Gynecology;  Laterality: N/A;  Primary Cesarean Section Delivery Baby Boy @ 0004, Apgars 9/9   CESAREAN SECTION N/A 12/10/2013   Procedure: REPEAT CESAREAN SECTION;  Surgeon: [name], MD;  Location: [facility];  Service: Obstetrics;  Laterality: N/A;   CESAREAN SECTION     colonoscopy  11/23/2018   stated had issues with sleep when in for procedure   ESOPHAGOGASTRODUODENOSCOPY  (EGD) WITH PROPOFOL N/A 08/27/2021   Procedure: ESOPHAGOGASTRODUODENOSCOPY (EGD) WITH PROPOFOL;  Surgeon: Yetta Flock, MD;  Location: [facility];  Service: Gastroenterology;  Laterality: N/A;   OTHER SURGICAL HISTORY  2005   Uterine surgery    uterine cauterization     Patient Active Problem List   Diagnosis Date Noted   Type 2 diabetes mellitus with obesity ([place]) 03/25/2022   Hypotension - iatrogenic 03/18/2022   Other chronic pain 02/02/2022   Dyspepsia    Early satiety    IDA (iron deficiency anemia) 01/29/2021   Paroxysmal supraventricular tachycardia 10/01/2020   Class 1 obesity with serious comorbidity and body mass index (BMI) of 31.0 to 31.9 in adult 10/01/2020   Diabetes mellitus ([place]) 02/14/2020   Mixed diabetic hyperlipidemia associated with type 2 diabetes mellitus ([place]) 01/10/2020   Hypertension associated with diabetes ([place]) 01/10/2020   Diabetic neuropathy ([place]) 01/10/2020   Vitamin D deficiency 01/10/2020   Diabetes mellitus with proteinuria ([place]) 78/46/9629   History of Helicobacter pylori infection 08/08/2019   Proteinuria 06/14/2019   Elevated sed rate 05/23/2019   Elevated C-reactive protein (CRP) 05/23/2019   Diabetes mellitus without complication ([place]) 52/84/1324  Dyslipidemia (high LDL; low HDL) 05/23/2019   Type 2 diabetes mellitus with hyperglycemia, without long-term current use of insulin ([place]) 02/17/2019   Hemoglobin A1C between 7% and 9% indicating borderline diabetic control ([place]) 02/17/2019   Breast cancer screening by mammogram 02/17/2019   Frequent headaches 02/14/2019   Anxiety 02/14/2019   Common migraine with intractable migraine 06/28/2017   Chest pain at rest 10/17/2016   Sinus tachycardia 08/18/2016   Prolonged Q-T interval on ECG 08/18/2016   Vertigo 07/22/2015   Abdominal discomfort 02/14/2015   Low back pain 02/14/2015   Abdominal pain 02/01/2015   Nausea without vomiting 02/01/2015   Environmental allergies 02/01/2015    Language barrier to communication 02/01/2015   Anemia 01/23/2015   Constipation 01/21/2015   Knee pain, bilateral 01/21/2015   Palpitations 09/18/2013   Shortness of breath 09/18/2013   Advanced maternal age in pregnancy in second trimester 06/19/2013   IBS (irritable bowel syndrome) 04/13/2003    PCP: Fenton Foy, NP  REFERRING PROVIDER: Fenton Foy, NP  REFERRING DIAG:  [phone] (ICD-10-CM) - Chronic right shoulder pain  M54.9,G89.29 (ICD-10-CM) - Chronic mid back pain  R42 (ICD-10-CM) - Vertigo    Rationale for Evaluation and Treatment: Rehabilitation  THERAPY DIAG:  Dizziness and giddiness  Chronic right shoulder pain  Other low back pain  Muscle weakness (generalized)  ONSET DATE: 04/09/22  SUBJECTIVE:                                                                                                                                                                                           SUBJECTIVE STATEMENT: The dizziness is okay now but when I left last visit it was worse. Shoulder pain is still high especially if reaching overhead.   PERTINENT HISTORY:  Patient presents today for a follow-up visit.  Patient does complain of right ear pain with some dizziness x 1 month.  Upon exam ears appear normal.  Patient does also complain of ongoing right shoulder pain and mid back pain.  This has been a chronic issue.  We will get x-rays today.  Will refer patient for physical therapy. Denies f/c/s, n/v/d, hemoptysis, PND, leg swelling  PAIN:  Are you having pain? Yes: NPRS scale: 9, shoulder, 8, back/10 Pain location: R shoulder into arm, mid back Pain description: unable to describe Aggravating factors: housework Relieving factors: none  PRECAUTIONS: None  WEIGHT BEARING RESTRICTIONS: No  FALLS:  Has patient fallen in last 6 months? No  LIVING ENVIRONMENT: Lives with: lives with their family Lives in: House/apartment Stairs: No Has following  equipment at home: None  OCCUPATION: N/A  PLOF: Independent  PATIENT GOALS: Feel better  NEXT MD VISIT:   OBJECTIVE:   DIAGNOSTIC FINDINGS:  EXAM: RIGHT SHOULDER - 3 VIEWFINDINGS: There is no evidence of fracture or dislocation. There is no evidence of arthropathy or other focal bone abnormality. Soft tissues are unremarkable. THORACIC SPINE 3 VIEWS FINDINGS: There is no evidence of thoracic spine fracture. Alignment is normal. No other significant bone abnormalities are identified.   SCREENING FOR RED FLAGS: Bowel or bladder incontinence: No Spinal tumors: No Cauda equina syndrome: No Compression fracture: No Abdominal aneurysm: No  COGNITION: Overall cognitive status: Within functional limits for tasks assessed     SENSATION: Patient reports tingling and numbness in her toes, B.  MUSCLE LENGTH: Hamstrings: Right 75 deg; Left 65 deg   POSTURE: rounded shoulders, forward head, and flexed trunk   PALPATION: Tight and TTP on B cerv paraspinals, B up traps, R medial parascapular muscles, Ant cervical muscles, R > L, B pects, lower T spine and upper lumbar paraspinals  UE ROM: R shoulder P flex to 105, abd to 85 before it becomes painful  UE STRENGTH: R shoulder pain limits MMT  UE SPECIAL TESTS: R Hawkins, Neer, empty can test all (+), belly press test (-)  CERVICAL ROM: Limited by pain in all directions, mostly ext and B lateral flex  THORACIC MOBILITY: Limited due to pain.  LOWER EXTREMITY ROM: Mild tightness noted in R knee to chest  LOWER EXTREMITY STRENGTH: B hips and knee 3+/5  FUNCTIONAL ASSESSMENT: Quick DASH 71%  VESTIBULAR ASSESSMENT: TBD  GAIT: Distance walked: In clinic distances Assistive device utilized: None Level of assistance: Complete Independence Comments: WFL  TODAY'S TREATMENT:                                                                                                                              DATE:  05/14/22 L Epley-  negative but dizziness when sitting back up, goes away <20s  Saccades looking back and forth 2 targets- dizziness with vertical movements after stopping VOR x1 horizontal and vertical 20 reps- a little dizzy after looking vertically VOR x2 dizzy after 6 reps horizontal but second rep able to do 20 VOR x2 dizzy after 10 vertical reps, then does 20 but dizzy looking up STM with gun to bilateral UT  Manual cervical traction and STM to suboccipitals  Supine shoulder flexion 2# 2x10 Chest press 2# 2x10 PROM to R shoulder all directions and lateral distraction   05/12/22 Vestibular assessment- Observation- eyes aligned, tracking WNL Saccades- increased dizziness reported after 6 reps, Convergence-Dizziness reported at 15" VOR-(+) horizontal, (-) vertical VOR cancellation (-) horizontal, deferred Vert due to fatigue.  Orthostatic BPs Supine- 99/79 HR 81BPM Sitting- 117/86 HR 82 BPM, 3 minutes- 102/85 HR81 Stand- 98/82 HR 84, 3 minutes- 102/86 HR 90  BPPV Dix-Hallpike (-) R, (+) L Performed L Epley maneuver with residual dizziness upon sitting.   UBE L1 x82mns forwards, unable to go backwards Standing rows red  2x10 Standing ext yellow 2x10 ER scaption with red 2x10 Shoulder flexion 2# WaTE 2x10  Shoulder ext with 2# WaTE 2x10 STM to c-spine and neck area followed by [facility]  04/27/22  Patient education  PATIENT EDUCATION:  Education details: POC, Plan to assess vestibular system Person educated: Patient and interpreter Education method: Explanation Education comprehension: verbalized understanding  HOME EXERCISE PROGRAM: [id number]  ASSESSMENT:  CLINICAL IMPRESSION: Started visit with Epley maneuver, patient does not have any provoking symptoms until she sat up but the dizziness only lasted a few seconds. Followed up with some vestibular exercises, has some dizziness following mostly vertical movements and VOR x2 in both directions. Patient states she is dizzy when looking up. She  is very tight in her upper traps and around her neck, did some STM and manual traction. She reports she suffers from migraines often. Also did some shoulder strengthening and mobility exercises. Pain with end range shoulder flexion but most pain is with abduction and IR, very guarded with these motions.   OBJECTIVE IMPAIRMENTS: decreased activity tolerance, decreased coordination, decreased mobility, decreased ROM, decreased strength, increased muscle spasms, impaired flexibility, impaired UE functional use, improper body mechanics, postural dysfunction, and pain.   ACTIVITY LIMITATIONS: carrying, lifting, bending, sitting, squatting, sleeping, bathing, dressing, reach over head, hygiene/grooming, and locomotion level  PARTICIPATION LIMITATIONS: meal prep, cleaning, laundry, and shopping  PERSONAL FACTORS: Past/current experiences are also affecting patient's functional outcome.   REHAB POTENTIAL: Good  CLINICAL DECISION MAKING: Evolving/moderate complexity  EVALUATION COMPLEXITY: Moderate   GOALS: Goals reviewed with patient? Yes  SHORT TERM GOALS: Target date: 04/27/22  I with initial HEP Baseline: Goal status: ongoing  LONG TERM GOALS: Target date: 07/20/22  I with final HEP Baseline:  Goal status: INITIAL  2.  Patient will be able to perform her normal daily activities including housekeeping, cooking, self car, with R shoulder pain < 4/10 Baseline:  Goal status: INITIAL  3.  Increase R shoulder strength to at least 4/5 Baseline:  Goal status: INITIAL  4.  Increase BLE strength to at least 4/5 Baseline:  Goal status: ongoing  5.  Patient will report improvement in back pain by at least 50% Baseline:  Goal status: INITIAL  6.  Patient will report no dizziness while moving about throughout the day Baseline:  Goal status: INITIAL  PLAN:  PT FREQUENCY: 2x/week  PT DURATION: 12 weeks  PLANNED INTERVENTIONS: Therapeutic exercises, Therapeutic activity,  Neuromuscular re-education, Balance training, Gait training, Patient/Family education, Self Care, Joint mobilization, Vestibular training, Canalith repositioning, Dry Needling, Electrical stimulation, Spinal mobilization, Cryotherapy, Moist heat, Taping, Vasopneumatic device, Traction, Ionotophoresis '4mg'$ /ml Dexamethasone, and Manual therapy.  PLAN FOR NEXT SESSION: shoulder mobilization and strength, vestibular re-ed with VOR, re-assess L horizontal canal, maybe cervical traction  Andris Baumann, PT, DPT

## 2022-05-14 ENCOUNTER — Ambulatory Visit: Payer: Medicaid Other | Attending: Nurse Practitioner

## 2022-05-14 DIAGNOSIS — M6281 Muscle weakness (generalized): Secondary | ICD-10-CM | POA: Diagnosis present

## 2022-05-14 DIAGNOSIS — R42 Dizziness and giddiness: Secondary | ICD-10-CM | POA: Insufficient documentation

## 2022-05-14 DIAGNOSIS — M5459 Other low back pain: Secondary | ICD-10-CM | POA: Insufficient documentation

## 2022-05-14 DIAGNOSIS — R278 Other lack of coordination: Secondary | ICD-10-CM | POA: Diagnosis present

## 2022-05-14 DIAGNOSIS — G8929 Other chronic pain: Secondary | ICD-10-CM | POA: Insufficient documentation

## 2022-05-14 DIAGNOSIS — M25511 Pain in right shoulder: Secondary | ICD-10-CM | POA: Diagnosis present

## 2022-05-18 ENCOUNTER — Encounter (INDEPENDENT_AMBULATORY_CARE_PROVIDER_SITE_OTHER): Payer: Self-pay | Admitting: Adult Health

## 2022-05-18 ENCOUNTER — Ambulatory Visit (INDEPENDENT_AMBULATORY_CARE_PROVIDER_SITE_OTHER): Payer: Medicaid Other | Admitting: Adult Health

## 2022-05-18 VITALS — BP 109/74 | HR 89 | Temp 98.5°F | Ht <= 58 in | Wt 126.0 lb

## 2022-05-18 DIAGNOSIS — E66811 Obesity, class 1: Secondary | ICD-10-CM

## 2022-05-18 DIAGNOSIS — E1169 Type 2 diabetes mellitus with other specified complication: Secondary | ICD-10-CM | POA: Diagnosis not present

## 2022-05-18 DIAGNOSIS — Z6826 Body mass index (BMI) 26.0-26.9, adult: Secondary | ICD-10-CM | POA: Diagnosis not present

## 2022-05-18 DIAGNOSIS — Z7985 Long-term (current) use of injectable non-insulin antidiabetic drugs: Secondary | ICD-10-CM

## 2022-05-18 DIAGNOSIS — E669 Obesity, unspecified: Secondary | ICD-10-CM | POA: Diagnosis not present

## 2022-05-18 MED ORDER — OZEMPIC (0.25 OR 0.5 MG/DOSE) 2 MG/1.5ML ~~LOC~~ SOPN: 0.5000 mg | PEN_INJECTOR | SUBCUTANEOUS | 0 refills | Status: DC

## 2022-05-19 ENCOUNTER — Encounter: Payer: Self-pay | Admitting: Physical Therapy

## 2022-05-19 ENCOUNTER — Ambulatory Visit: Payer: Medicaid Other | Admitting: Physical Therapy

## 2022-05-19 DIAGNOSIS — G8929 Other chronic pain: Secondary | ICD-10-CM

## 2022-05-19 DIAGNOSIS — R278 Other lack of coordination: Secondary | ICD-10-CM

## 2022-05-19 DIAGNOSIS — R42 Dizziness and giddiness: Secondary | ICD-10-CM | POA: Diagnosis not present

## 2022-05-19 DIAGNOSIS — M6281 Muscle weakness (generalized): Secondary | ICD-10-CM

## 2022-05-19 NOTE — Therapy (Signed)
OUTPATIENT PHYSICAL THERAPY THORACOLUMBAR EVALUATION   Patient Name: Betty Cain MRN: [id number] DOB:03-Aug-1975, 47 y.o., female Today's Date: 05/19/2022  END OF SESSION:  PT End of Session - 05/19/22 1107     Visit Number 5    Date for PT Re-Evaluation 07/20/22    PT Start Time 1103    PT Stop Time 1142    PT Time Calculation (min) 39 min    Activity Tolerance Patient limited by pain    Behavior During Therapy [facility] for tasks assessed/performed                Past Medical History:  Diagnosis Date   Allergy    Anemia    Anxiety 01/2019   Asthma    B12 deficiency    Back pain    Common migraine with intractable migraine 06/28/2017   Constipation    Diabetes ([place]) 02/2019   Fatigue    GERD (gastroesophageal reflux disease)    pos H pylori   Hypertension    controlled with medication   IBS (irritable bowel syndrome) 2005   Joint pain    Neonatal death    Vaginal delivery, full term-lived x2 hours.    Palpitations    Shortness of breath    Shortness of breath on exertion    Spinal headache    Swelling of both lower extremities    Valvular heart disease    Vitamin D deficiency 02/2019   Past Surgical History:  Procedure Laterality Date   ADENOIDECTOMY     and tonsils (as a child)   BIOPSY  08/27/2021   Procedure: BIOPSY;  Surgeon: Yetta Flock, MD;  Location: [facility];  Service: Gastroenterology;;   boil  2003   right elbow   CESAREAN SECTION     x4   CESAREAN SECTION  06/02/2011   Procedure: CESAREAN SECTION;  Surgeon: [name], MD;  Location: [facility];  Service: Gynecology;  Laterality: N/A;  Primary Cesarean Section Delivery Baby Boy @ 0004, Apgars 9/9   CESAREAN SECTION N/A 12/10/2013   Procedure: REPEAT CESAREAN SECTION;  Surgeon: [name], MD;  Location: [facility];  Service: Obstetrics;  Laterality: N/A;   CESAREAN SECTION     colonoscopy  11/23/2018   stated had issues with sleep when in for procedure   ESOPHAGOGASTRODUODENOSCOPY  (EGD) WITH PROPOFOL N/A 08/27/2021   Procedure: ESOPHAGOGASTRODUODENOSCOPY (EGD) WITH PROPOFOL;  Surgeon: Yetta Flock, MD;  Location: [facility];  Service: Gastroenterology;  Laterality: N/A;   OTHER SURGICAL HISTORY  2005   Uterine surgery    uterine cauterization     Patient Active Problem List   Diagnosis Date Noted   Type 2 diabetes mellitus with obesity ([place]) 03/25/2022   Hypotension - iatrogenic 03/18/2022   Other chronic pain 02/02/2022   Dyspepsia    Early satiety    IDA (iron deficiency anemia) 01/29/2021   Paroxysmal supraventricular tachycardia 10/01/2020   Class 1 obesity with serious comorbidity and body mass index (BMI) of 31.0 to 31.9 in adult 10/01/2020   Diabetes mellitus ([place]) 02/14/2020   Mixed diabetic hyperlipidemia associated with type 2 diabetes mellitus ([place]) 01/10/2020   Hypertension associated with diabetes ([place]) 01/10/2020   Diabetic neuropathy ([place]) 01/10/2020   Vitamin D deficiency 01/10/2020   Diabetes mellitus with proteinuria ([place]) 58/85/0277   History of Helicobacter pylori infection 08/08/2019   Proteinuria 06/14/2019   Elevated sed rate 05/23/2019   Elevated C-reactive protein (CRP) 05/23/2019   Diabetes mellitus without complication ([place]) 41/28/7867  Dyslipidemia (high LDL; low HDL) 05/23/2019   Type 2 diabetes mellitus with hyperglycemia, without long-term current use of insulin ([place]) 02/17/2019   Hemoglobin A1C between 7% and 9% indicating borderline diabetic control ([place]) 02/17/2019   Breast cancer screening by mammogram 02/17/2019   Frequent headaches 02/14/2019   Anxiety 02/14/2019   Common migraine with intractable migraine 06/28/2017   Chest pain at rest 10/17/2016   Sinus tachycardia 08/18/2016   Prolonged Q-T interval on ECG 08/18/2016   Vertigo 07/22/2015   Abdominal discomfort 02/14/2015   Low back pain 02/14/2015   Abdominal pain 02/01/2015   Nausea without vomiting 02/01/2015   Environmental allergies 02/01/2015    Language barrier to communication 02/01/2015   Anemia 01/23/2015   Constipation 01/21/2015   Knee pain, bilateral 01/21/2015   Palpitations 09/18/2013   Shortness of breath 09/18/2013   Advanced maternal age in pregnancy in second trimester 06/19/2013   IBS (irritable bowel syndrome) 04/13/2003    PCP: Fenton Foy, NP  REFERRING PROVIDER: Fenton Foy, NP  REFERRING DIAG:  [phone] (ICD-10-CM) - Chronic right shoulder pain  M54.9,G89.29 (ICD-10-CM) - Chronic mid back pain  R42 (ICD-10-CM) - Vertigo    Rationale for Evaluation and Treatment: Rehabilitation  THERAPY DIAG:  No diagnosis found.  ONSET DATE: 04/09/22  SUBJECTIVE:                                                                                                                                                                                           SUBJECTIVE STATEMENT: The shoulder pain is better overall. Still bothers her with shoulder elevation and retraction. It is usually aggravated after therapy, but goes away in 1 day. The dizziness is slightly improved. She has not been doing any exercises because she didn't realize she was supposed to do them.  PERTINENT HISTORY:  Patient presents today for a follow-up visit.  Patient does complain of right ear pain with some dizziness x 1 month.  Upon exam ears appear normal.  Patient does also complain of ongoing right shoulder pain and mid back pain.  This has been a chronic issue.  We will get x-rays today.  Will refer patient for physical therapy. Denies f/c/s, n/v/d, hemoptysis, PND, leg swelling  PAIN:  Are you having pain? Yes: NPRS scale: 9, shoulder, 8, back/10 Pain location: R shoulder into arm, mid back Pain description: unable to describe Aggravating factors: housework Relieving factors: none  PRECAUTIONS: None  WEIGHT BEARING RESTRICTIONS: No  FALLS:  Has patient fallen in last 6 months? No  LIVING ENVIRONMENT: Lives with: lives with  their family Lives in: House/apartment Stairs: No Has following  equipment at home: None  OCCUPATION: N/A  PLOF: Independent  PATIENT GOALS: Feel better  NEXT MD VISIT:   OBJECTIVE:   DIAGNOSTIC FINDINGS:  EXAM: RIGHT SHOULDER - 3 VIEWFINDINGS: There is no evidence of fracture or dislocation. There is no evidence of arthropathy or other focal bone abnormality. Soft tissues are unremarkable. THORACIC SPINE 3 VIEWS FINDINGS: There is no evidence of thoracic spine fracture. Alignment is normal. No other significant bone abnormalities are identified.   SCREENING FOR RED FLAGS: Bowel or bladder incontinence: No Spinal tumors: No Cauda equina syndrome: No Compression fracture: No Abdominal aneurysm: No  COGNITION: Overall cognitive status: Within functional limits for tasks assessed     SENSATION: Patient reports tingling and numbness in her toes, B.  MUSCLE LENGTH: Hamstrings: Right 75 deg; Left 65 deg   POSTURE: rounded shoulders, forward head, and flexed trunk   PALPATION: Tight and TTP on B cerv paraspinals, B up traps, R medial parascapular muscles, Ant cervical muscles, R > L, B pects, lower T spine and upper lumbar paraspinals  UE ROM: R shoulder P flex to 105, abd to 85 before it becomes painful  UE STRENGTH: R shoulder pain limits MMT  UE SPECIAL TESTS: R Hawkins, Neer, empty can test all (+), belly press test (-)  CERVICAL ROM: Limited by pain in all directions, mostly ext and B lateral flex  THORACIC MOBILITY: Limited due to pain.  LOWER EXTREMITY ROM: Mild tightness noted in R knee to chest  LOWER EXTREMITY STRENGTH: B hips and knee 3+/5  FUNCTIONAL ASSESSMENT: Quick DASH 71%  VESTIBULAR ASSESSMENT: TBD  GAIT: Distance walked: In clinic distances Assistive device utilized: None Level of assistance: Complete Independence Comments: WFL  TODAY'S TREATMENT:                                                                                                                               DATE:  05/19/22 L Epley- eyelids fluttered, and dizziness with each position STM to R supraspinatus, long head of biceps, pects, F/B stretch and humeral mobs in inf and post glide Shoulder retraction/depression in sitting with hands behind hips x 10 Scap protraction against G tband x 10, wall slides into flex x 10-painful VOR x 1 no dizziness hor or vert VOR x 2 dizziness reported   05/14/22 L Epley- negative but dizziness when sitting back up, goes away <20s  Saccades looking back and forth 2 targets- dizziness with vertical movements after stopping VOR x1 horizontal and vertical 20 reps- a little dizzy after looking vertically VOR x2 dizzy after 6 reps horizontal but second rep able to do 20 VOR x2 dizzy after 10 vertical reps, then does 20 but dizzy looking up STM with gun to bilateral UT  Manual cervical traction and STM to suboccipitals  Supine shoulder flexion 2# 2x10 Chest press 2# 2x10 PROM to R shoulder all directions and lateral distraction   05/12/22 Vestibular assessment- Observation- eyes aligned, tracking WNL  Saccades- increased dizziness reported after 6 reps, Convergence-Dizziness reported at 15" VOR-(+) horizontal, (-) vertical VOR cancellation (-) horizontal, deferred Vert due to fatigue.  Orthostatic BPs Supine- 99/79 HR 81BPM Sitting- 117/86 HR 82 BPM, 3 minutes- 102/85 HR81 Stand- 98/82 HR 84, 3 minutes- 102/86 HR 90  BPPV Dix-Hallpike (-) R, (+) L Performed L Epley maneuver with residual dizziness upon sitting.   UBE L1 x75mns forwards, unable to go backwards Standing rows red 2x10 Standing ext yellow 2x10 ER scaption with red 2x10 Shoulder flexion 2# WaTE 2x10  Shoulder ext with 2# WaTE 2x10 STM to c[facility]spine and neck [facility]  Patient education  PATIENT EDUCATION:  Education details: POC, Plan to assess vestibular system Person educated: Patient and interpreter Education method:  Explanation Education comprehension: verbalized understanding  HOME EXERCISE PROGRAM: [id number] ASSESSMENT:  CLINICAL IMPRESSION: Started visit with Epley maneuver. Eyelids fluttered with each change of position, patient reported dizziness, resolved after 15-20 seconds each time. Moved to R shoulder to allow dizziness to settle. Performed cross friction massage over R supraspinatus and long biceps tendons, deep tissue stretch to pects, followed by humeral mobs and stretch to shoulder. She then performed some exercises for shoulder stability. She noted that she got dizzy while performing wall slides. Returned to vestibular treatment with VOR neg, VOR cancellation positive in both directions. HEP updated to include the vestibular activities and patient encouraged to perform as tolerated x 2 daily and will re-assess upon return.  OBJECTIVE IMPAIRMENTS: decreased activity tolerance, decreased coordination, decreased mobility, decreased ROM, decreased strength, increased muscle spasms, impaired flexibility, impaired UE functional use, improper body mechanics, postural dysfunction, and pain.   ACTIVITY LIMITATIONS: carrying, lifting, bending, sitting, squatting, sleeping, bathing, dressing, reach over head, hygiene/grooming, and locomotion level  PARTICIPATION LIMITATIONS: meal prep, cleaning, laundry, and shopping  PERSONAL FACTORS: Past/current experiences are also affecting patient's functional outcome.   REHAB POTENTIAL: Good  CLINICAL DECISION MAKING: Evolving/moderate complexity  EVALUATION COMPLEXITY: Moderate   GOALS: Goals reviewed with patient? Yes  SHORT TERM GOALS: Target date: 04/27/22  I with initial HEP Baseline: Goal status: ongoing  LONG TERM GOALS: Target date: 07/20/22  I with final HEP Baseline:  Goal status: 05/19/22-ongoing  2.  Patient will be able to perform her normal daily activities including housekeeping, cooking, self car, with R shoulder pain <  4/10 Baseline:  Goal status: 05/19/22- ongoing  3.  Increase R shoulder strength to at least 4/5 Baseline:  Goal status: INITIAL  4.  Increase BLE strength to at least 4/5 Baseline:  Goal status: ongoing  5.  Patient will report improvement in back pain by at least 50% Baseline:  Goal status: INITIAL  6.  Patient will report no dizziness while moving about throughout the day Baseline:  Goal status: INITIAL  PLAN:  PT FREQUENCY: 2x/week  PT DURATION: 12 weeks  PLANNED INTERVENTIONS: Therapeutic exercises, Therapeutic activity, Neuromuscular re-education, Balance training, Gait training, Patient/Family education, Self Care, Joint mobilization, Vestibular training, Canalith repositioning, Dry Needling, Electrical stimulation, Spinal mobilization, Cryotherapy, Moist heat, Taping, Vasopneumatic device, Traction, Ionotophoresis '4mg'$ /ml Dexamethasone, and Manual therapy.  PLAN FOR NEXT SESSION: shoulder mobilization and strength, vestibular re-ed with VOR, re-assess L horizontal canal, maybe cervical traction. Assess tolerance to HEP for vestibular re-ed  SEthel RanaDPT 05/19/22 2:16 PM

## 2022-05-24 ENCOUNTER — Ambulatory Visit (INDEPENDENT_AMBULATORY_CARE_PROVIDER_SITE_OTHER): Payer: Medicaid Other | Admitting: Adult Health

## 2022-05-26 NOTE — Progress Notes (Signed)
Chief Complaint:   OBESITY Dale is here to discuss her progress with her obesity treatment plan along with follow-up of her obesity related diagnoses. Jazzy is on the Category 2 Plan and states she is following her eating plan approximately 85% of the time. Domenique states she is walking the treadmill and YouTube videos 30 minutes 5 times per week.  Today's visit was #: 60 Starting weight: 152 lbs Starting date: 01/10/2020 Today's weight: 126 lbs Today's date: 05/18/2022 Total lbs lost to date: 26 lbs Total lbs lost since last in-office visit: 2 lbs  Interim History:  Her Current Weight 126 lbs, with corresponding BMI 26.3 Her Goal Weight 120 lbs  Her Visceral Adipose Rating is 7- at goal  Of Note- [place] Interpretor at [facility] during [place].  Subjective:   1. Type 2 diabetes mellitus with obesity ([place])  Patient is currently on Ozempic 0.5 mg weekly injection.   Patient is complaining of chronic idiopathic constipation.   Patient has occasional morning hypoglycemia sx's. She denies mass in neck, dysphagia, dyspepsia, persistent hoarseness, abdominal pain, or N/V. She has not been checking CBG at home. Lab Results  Component Value Date   HGBA1C 5.1 04/09/2022   HGBA1C 4.9 12/28/2021   HGBA1C 5.0 08/19/2021   HGBA1C 5.0 08/19/2021   HGBA1C 5.0 (A) 08/19/2021   HGBA1C 5.0 08/19/2021     Assessment/Plan:   1. Type 2 diabetes mellitus with obesity ([facility]) Refill- Semaglutide,0.25 or 0.5MG/DOS, (OZEMPIC, 0.25 OR 0.5 MG/DOSE,) 2 MG/1.5ML SOPN; Inject 0.5 mg into the skin once a week.  Dispense: 3 mL; Refill: 0 Check CBG fasting, PP, and if sx's of hypoglycemia occur. Please bring log to f/U OV.  2. Obesity, current BMI 26.3 Aalina is currently in the action stage of change. As such, her goal is to continue with weight loss efforts. She has agreed to the Category 2 Plan.   Exercise goals:  As is.  Behavioral modification strategies: increasing lean protein intake, decreasing  simple carbohydrates, meal planning and cooking strategies, keeping healthy foods in the home, and planning for success.  Asiana has agreed to follow-up with our clinic in 4 weeks. She was informed of the importance of frequent follow-up visits to maximize her success with intensive lifestyle modifications for her multiple health conditions.   Objective:   Blood pressure 109/74, pulse 89, temperature 98.5 F (36.9 C), height 4' 10"$  (1.473 m), weight 126 lb (57.2 kg), SpO2 99 %. Body mass index is 26.33 kg/m.  General: Cooperative, alert, well developed, in no acute distress. HEENT: Conjunctivae and lids unremarkable. Cardiovascular: Regular rhythm.  Lungs: Normal work of breathing. Neurologic: No focal deficits.   Lab Results  Component Value Date   CREATININE 0.64 04/09/2022   BUN 23 04/09/2022   NA 144 04/09/2022   K 4.4 04/09/2022   CL 105 04/09/2022   CO2 21 04/09/2022   Lab Results  Component Value Date   ALT 19 04/09/2022   AST 19 04/09/2022   ALKPHOS 92 04/09/2022   BILITOT 0.4 04/09/2022   Lab Results  Component Value Date   HGBA1C 5.1 04/09/2022   HGBA1C 4.9 12/28/2021   HGBA1C 5.0 08/19/2021   HGBA1C 5.0 08/19/2021   HGBA1C 5.0 (A) 08/19/2021   HGBA1C 5.0 08/19/2021   Lab Results  Component Value Date   INSULIN 3.8 12/28/2021   INSULIN 8.7 01/10/2020   Lab Results  Component Value Date   TSH 2.530 12/28/2021   Lab Results  Component Value  Date   CHOL 117 04/09/2022   HDL 44 04/09/2022   LDLCALC 58 04/09/2022   TRIG 75 04/09/2022   CHOLHDL 2.7 04/09/2022   Lab Results  Component Value Date   VD25OH 35.6 12/28/2021   VD25OH 22.7 (L) 10/09/2021   VD25OH 30.0 01/01/2021   Lab Results  Component Value Date   WBC 8.7 04/09/2022   HGB 12.5 04/09/2022   HCT 37.7 04/09/2022   MCV 91 04/09/2022   PLT 287 04/09/2022   Lab Results  Component Value Date   IRON 83 05/19/2021   TIBC 230 (L) 05/19/2021   FERRITIN 478 (H) 05/19/2021   Attestation  Statements:   Reviewed by clinician on day of visit: allergies, medications, problem list, medical history, surgical history, family history, social history, and previous encounter notes.  I, Davy Pique, RMA, am acting as Location manager for Mina Marble, NP.  I have reviewed the above documentation for accuracy and completeness, and I agree with the above. -  Reeshemah Nazaryan d.Lillyanna Glandon, NP-C

## 2022-05-27 NOTE — Therapy (Signed)
OUTPATIENT PHYSICAL THERAPY THORACOLUMBAR TREATMENT   Patient Name: Betty Cain MRN: [id number] DOB:06/04/1975, 47 y.o., female Today's Date: 05/28/2022  END OF SESSION:  PT End of Session - 05/28/22 0933     Visit Number 6    Date for PT Re-Evaluation 07/20/22    PT Start Time 0930    PT Stop Time 1015    PT Time Calculation (min) 45 min    Activity Tolerance Patient limited by pain    Behavior During Therapy [facility] for tasks assessed/performed                 Past Medical History:  Diagnosis Date   Allergy    Anemia    Anxiety 01/2019   Asthma    B12 deficiency    Back pain    Common migraine with intractable migraine 06/28/2017   Constipation    Diabetes ([place]) 02/2019   Fatigue    GERD (gastroesophageal reflux disease)    pos H pylori   Hypertension    controlled with medication   IBS (irritable bowel syndrome) 2005   Joint pain    Neonatal death    Vaginal delivery, full term-lived x2 hours.    Palpitations    Shortness of breath    Shortness of breath on exertion    Spinal headache    Swelling of both lower extremities    Valvular heart disease    Vitamin D deficiency 02/2019   Past Surgical History:  Procedure Laterality Date   ADENOIDECTOMY     and tonsils (as a child)   BIOPSY  08/27/2021   Procedure: BIOPSY;  Surgeon: Yetta Flock, MD;  Location: [facility];  Service: Gastroenterology;;   boil  2003   right elbow   CESAREAN SECTION     x4   CESAREAN SECTION  06/02/2011   Procedure: CESAREAN SECTION;  Surgeon: [name], MD;  Location: [place] [facility];  Service: Gynecology;  Laterality: N/A;  Primary Cesarean Section Delivery Baby Boy @ 0004, Apgars 9/9   CESAREAN SECTION N/A 12/10/2013   Procedure: REPEAT CESAREAN SECTION;  Surgeon: [name], MD;  Location: [place] [facility];  Service: Obstetrics;  Laterality: N/A;   CESAREAN SECTION     colonoscopy  11/23/2018   stated had issues with sleep when in for procedure    ESOPHAGOGASTRODUODENOSCOPY (EGD) WITH PROPOFOL N/A 08/27/2021   Procedure: ESOPHAGOGASTRODUODENOSCOPY (EGD) WITH PROPOFOL;  Surgeon: Yetta Flock, MD;  Location: [facility];  Service: Gastroenterology;  Laterality: N/A;   OTHER SURGICAL HISTORY  2005   Uterine surgery    uterine cauterization     Patient Active Problem List   Diagnosis Date Noted   Type 2 diabetes mellitus with obesity ([place]) 03/25/2022   Hypotension - iatrogenic 03/18/2022   Other chronic pain 02/02/2022   Dyspepsia    Early satiety    IDA (iron deficiency anemia) 01/29/2021   Paroxysmal supraventricular tachycardia 10/01/2020   Class 1 obesity with serious comorbidity and body mass index (BMI) of 31.0 to 31.9 in adult 10/01/2020   Diabetes mellitus ([place]) 02/14/2020   Mixed diabetic hyperlipidemia associated with type 2 diabetes mellitus ([place]) 01/10/2020   Hypertension associated with diabetes ([place]) 01/10/2020   Diabetic neuropathy ([place]) 01/10/2020   Vitamin D deficiency 01/10/2020   Diabetes mellitus with proteinuria ([place]) [id number]   History of Helicobacter pylori infection 08/08/2019   Proteinuria 06/14/2019   Elevated sed rate 05/23/2019   Elevated C-reactive protein (CRP) 05/23/2019   Diabetes mellitus without complication ([place])  05/23/2019   Dyslipidemia (high LDL; low HDL) 05/23/2019   Type 2 diabetes mellitus with hyperglycemia, without long-term current use of insulin ([place]) 02/17/2019   Hemoglobin A1C between 7% and 9% indicating borderline diabetic control ([place]) 02/17/2019   Breast cancer screening by mammogram 02/17/2019   Frequent headaches 02/14/2019   Anxiety 02/14/2019   Common migraine with intractable migraine 06/28/2017   Chest pain at rest 10/17/2016   Sinus tachycardia 08/18/2016   Prolonged Q-T interval on ECG 08/18/2016   Vertigo 07/22/2015   Abdominal discomfort 02/14/2015   Low back pain 02/14/2015   Abdominal pain 02/01/2015   Nausea without vomiting 02/01/2015    Environmental allergies 02/01/2015   Language barrier to communication 02/01/2015   Anemia 01/23/2015   Constipation 01/21/2015   Knee pain, bilateral 01/21/2015   Palpitations 09/18/2013   Shortness of breath 09/18/2013   Advanced maternal age in pregnancy in second trimester 06/19/2013   IBS (irritable bowel syndrome) 04/13/2003    PCP: Fenton Foy, NP  REFERRING PROVIDER: Fenton Foy, NP  REFERRING DIAG:  [id number] (ICD-10-CM) - Chronic right shoulder pain  M54.9,G89.29 (ICD-10-CM) - Chronic mid back pain  R42 (ICD-10-CM) - Vertigo    Rationale for Evaluation and Treatment: Rehabilitation  THERAPY DIAG:  Dizziness and giddiness  Chronic right shoulder pain  Muscle weakness (generalized)  Other lack of coordination  ONSET DATE: 04/09/22  SUBJECTIVE:                                                                                                                                                                                           SUBJECTIVE STATEMENT: I am better, still getting dizzy especially in the morning.   PERTINENT HISTORY:  Patient presents today for a follow-up visit.  Patient does complain of right ear pain with some dizziness x 1 month.  Upon exam ears appear normal.  Patient does also complain of ongoing right shoulder pain and mid back pain.  This has been a chronic issue.  We will get x-rays today.  Will refer patient for physical therapy. Denies f/c/s, n/v/d, hemoptysis, PND, leg swelling  PAIN:  Are you having pain? Yes: NPRS scale: 10/10 Pain location: R shoulder into arm, mid back Pain description: unable to describe Aggravating factors: housework Relieving factors: none  PRECAUTIONS: None  WEIGHT BEARING RESTRICTIONS: No  FALLS:  Has patient fallen in last 6 months? No  LIVING ENVIRONMENT: Lives with: lives with their family Lives in: House/apartment Stairs: No Has following equipment at home: None  OCCUPATION:  N/A  PLOF: Independent  PATIENT GOALS: Feel better  NEXT MD VISIT:   OBJECTIVE:  DIAGNOSTIC FINDINGS:  EXAM: RIGHT SHOULDER - 3 VIEWFINDINGS: There is no evidence of fracture or dislocation. There is no evidence of arthropathy or other focal bone abnormality. Soft tissues are unremarkable. THORACIC SPINE 3 VIEWS FINDINGS: There is no evidence of thoracic spine fracture. Alignment is normal. No other significant bone abnormalities are identified.   SCREENING FOR RED FLAGS: Bowel or bladder incontinence: No Spinal tumors: No Cauda equina syndrome: No Compression fracture: No Abdominal aneurysm: No  COGNITION: Overall cognitive status: Within functional limits for tasks assessed     SENSATION: Patient reports tingling and numbness in her toes, B.  MUSCLE LENGTH: Hamstrings: Right 75 deg; Left 65 deg   POSTURE: rounded shoulders, forward head, and flexed trunk   PALPATION: Tight and TTP on B cerv paraspinals, B up traps, R medial parascapular muscles, Ant cervical muscles, R > L, B pects, lower T spine and upper lumbar paraspinals  UE ROM: R shoulder P flex to 105, abd to 85 before it becomes painful  UE STRENGTH: R shoulder pain limits MMT  UE SPECIAL TESTS: R Hawkins, Neer, empty can test all (+), belly press test (-)  CERVICAL ROM: Limited by pain in all directions, mostly ext and B lateral flex  THORACIC MOBILITY: Limited due to pain.  LOWER EXTREMITY ROM: Mild tightness noted in R knee to chest  LOWER EXTREMITY STRENGTH: B hips and knee 3+/5  FUNCTIONAL ASSESSMENT: Quick DASH 71%  VESTIBULAR ASSESSMENT: TBD  GAIT: Distance walked: In clinic distances Assistive device utilized: None Level of assistance: Complete Independence Comments: WFL  TODAY'S TREATMENT:                                                                                                                              DATE:  05/28/22 Horizontal canal testing- positive for dizziness on  R side  BBQ roll  VOR x1 standing 80 bps, then 100bps VOR x2 standing 80 bps Shoulder flexion 2# x10 Shoulder abd 2# x10  Rows and ext red 2x10 MH to R shoulder and upper trap 10 mins   05/19/22 L Epley- eyelids fluttered, and dizziness with each position STM to R supraspinatus, long head of biceps, pects, F/B stretch and humeral mobs in inf and post glide Shoulder retraction/depression in sitting with hands behind hips x 10 Scap protraction against G tband x 10, wall slides into flex x 10-painful VOR x 1 no dizziness hor or vert VOR x 2 dizziness reported   05/14/22 L Epley- negative but dizziness when sitting back up, goes away <20s  Saccades looking back and forth 2 targets- dizziness with vertical movements after stopping VOR x1 horizontal and vertical 20 reps- a little dizzy after looking vertically VOR x2 dizzy after 6 reps horizontal but second rep able to do 20 VOR x2 dizzy after 10 vertical reps, then does 20 but dizzy looking up STM with gun to bilateral UT  Manual cervical traction and STM to suboccipitals  Supine  shoulder flexion 2# 2x10 Chest press 2# 2x10 PROM to R shoulder all directions and lateral distraction   05/12/22 Vestibular assessment- Observation- eyes aligned, tracking WNL Saccades- increased dizziness reported after 6 reps, Convergence-Dizziness reported at 15" VOR-(+) horizontal, (-) vertical VOR cancellation (-) horizontal, deferred Vert due to fatigue.  Orthostatic BPs Supine- 99/79 HR 81BPM Sitting- 117/86 HR 82 BPM, 3 minutes- 102/85 HR81 Stand- 98/82 HR 84, 3 minutes- 102/86 HR 90  BPPV Dix-Hallpike (-) R, (+) L Performed L Epley maneuver with residual dizziness upon sitting.   UBE L1 x57mns forwards, unable to go backwards Standing rows red 2x10 Standing ext yellow 2x10 ER scaption with red 2x10 Shoulder flexion 2# WaTE 2x10  Shoulder ext with 2# WaTE 2x10 STM [facility]spine [facility]  Patient  education  PATIENT EDUCATION:  Education details: POC, Plan to assess vestibular system Person educated: Patient and interpreter Education method: Explanation Education comprehension: verbalized understanding  HOME EXERCISE PROGRAM: [id number] ASSESSMENT:  CLINICAL IMPRESSION: Patient reports high pain levels today. Still experiencing dizziness, I did some horizontal canal testing. She has dizziness when turning to R side. Did a BBQ roll and she reports decreased symptoms of dizziness. VOR cancellation positive in both directions, some dizziness following metronome at 80 bps especially with VOR x2. Needs rest break. Continues to have high pain levels with all shoulder movements.   OBJECTIVE IMPAIRMENTS: decreased activity tolerance, decreased coordination, decreased mobility, decreased ROM, decreased strength, increased muscle spasms, impaired flexibility, impaired UE functional use, improper body mechanics, postural dysfunction, and pain.   ACTIVITY LIMITATIONS: carrying, lifting, bending, sitting, squatting, sleeping, bathing, dressing, reach over head, hygiene/grooming, and locomotion level  PARTICIPATION LIMITATIONS: meal prep, cleaning, laundry, and shopping  PERSONAL FACTORS: Past/current experiences are also affecting patient's functional outcome.   REHAB POTENTIAL: Good  CLINICAL DECISION MAKING: Evolving/moderate complexity  EVALUATION COMPLEXITY: Moderate   GOALS: Goals reviewed with patient? Yes  SHORT TERM GOALS: Target date: 04/27/22  I with initial HEP Baseline: Goal status: ongoing  LONG TERM GOALS: Target date: 07/20/22  I with final HEP Baseline:  Goal status: 05/19/22-ongoing  2.  Patient will be able to perform her normal daily activities including housekeeping, cooking, self car, with R shoulder pain < 4/10 Baseline:  Goal status: 05/19/22- ongoing  3.  Increase R shoulder strength to at least 4/5 Baseline:  Goal status: INITIAL  4.  Increase BLE  strength to at least 4/5 Baseline:  Goal status: ongoing  5.  Patient will report improvement in back pain by at least 50% Baseline:  Goal status: INITIAL  6.  Patient will report no dizziness while moving about throughout the day Baseline:  Goal status: INITIAL  PLAN:  PT FREQUENCY: 2x/week  PT DURATION: 12 weeks  PLANNED INTERVENTIONS: Therapeutic exercises, Therapeutic activity, Neuromuscular re-education, Balance training, Gait training, Patient/Family education, Self Care, Joint mobilization, Vestibular training, Canalith repositioning, Dry Needling, Electrical stimulation, Spinal mobilization, Cryotherapy, Moist heat, Taping, Vasopneumatic device, Traction, Ionotophoresis 426mml Dexamethasone, and Manual therapy.  PLAN FOR NEXT SESSION: shoulder mobilization and strength, vestibular re-ed with VOR, re-assess L horizontal canal, maybe cervical traction. Assess tolerance to HEP for vestibular re-ed  SuEthel RanaPT 05/28/22 10:15 AM

## 2022-05-28 ENCOUNTER — Ambulatory Visit: Payer: Medicaid Other

## 2022-05-28 DIAGNOSIS — R278 Other lack of coordination: Secondary | ICD-10-CM

## 2022-05-28 DIAGNOSIS — R42 Dizziness and giddiness: Secondary | ICD-10-CM

## 2022-05-28 DIAGNOSIS — G8929 Other chronic pain: Secondary | ICD-10-CM

## 2022-05-28 DIAGNOSIS — M6281 Muscle weakness (generalized): Secondary | ICD-10-CM

## 2022-05-29 ENCOUNTER — Other Ambulatory Visit (INDEPENDENT_AMBULATORY_CARE_PROVIDER_SITE_OTHER): Payer: Self-pay | Admitting: Nurse Practitioner

## 2022-05-29 ENCOUNTER — Other Ambulatory Visit: Payer: Self-pay | Admitting: Nurse Practitioner

## 2022-05-29 DIAGNOSIS — E1140 Type 2 diabetes mellitus with diabetic neuropathy, unspecified: Secondary | ICD-10-CM

## 2022-05-31 ENCOUNTER — Ambulatory Visit: Payer: Medicaid Other | Admitting: Physical Therapy

## 2022-06-01 NOTE — Telephone Encounter (Signed)
Please advise if you would like pt to continue. [place]

## 2022-06-04 ENCOUNTER — Encounter: Payer: Self-pay | Admitting: Internal Medicine

## 2022-06-04 ENCOUNTER — Ambulatory Visit: Payer: Medicaid Other

## 2022-06-04 DIAGNOSIS — G8929 Other chronic pain: Secondary | ICD-10-CM

## 2022-06-04 DIAGNOSIS — R42 Dizziness and giddiness: Secondary | ICD-10-CM | POA: Diagnosis not present

## 2022-06-04 DIAGNOSIS — M6281 Muscle weakness (generalized): Secondary | ICD-10-CM

## 2022-06-04 NOTE — Therapy (Signed)
OUTPATIENT PHYSICAL THERAPY THORACOLUMBAR TREATMENT   Patient Name: Betty Cain MRN: [id number] DOB:1975/05/03, 47 y.o., female Today's Date: 06/04/2022  END OF SESSION:  PT End of Session - 06/04/22 0932     Visit Number 7    Date for PT Re-Evaluation 07/20/22    PT Start Time 0930    PT Stop Time 1015    PT Time Calculation (min) 45 min    Activity Tolerance Patient limited by pain    Behavior During Therapy [facility] for tasks assessed/performed                  Past Medical History:  Diagnosis Date   Allergy    Anemia    Anxiety 01/2019   Asthma    B12 deficiency    Back pain    Common migraine with intractable migraine 06/28/2017   Constipation    Diabetes ([place]) 02/2019   Fatigue    GERD (gastroesophageal reflux disease)    pos H pylori   Hypertension    controlled with medication   IBS (irritable bowel syndrome) 2005   Joint pain    Neonatal death    Vaginal delivery, full term-lived x2 hours.    Palpitations    Shortness of breath    Shortness of breath on exertion    Spinal headache    Swelling of both lower extremities    Valvular heart disease    Vitamin D deficiency 02/2019   Past Surgical History:  Procedure Laterality Date   ADENOIDECTOMY     and tonsils (as a child)   BIOPSY  08/27/2021   Procedure: BIOPSY;  Surgeon: Yetta Flock, MD;  Location: [facility];  Service: Gastroenterology;;   boil  2003   right elbow   CESAREAN SECTION     x4   CESAREAN SECTION  06/02/2011   Procedure: CESAREAN SECTION;  Surgeon: [name], MD;  Location: [facility];  Service: Gynecology;  Laterality: N/A;  Primary Cesarean Section Delivery Baby Boy @ 0004, Apgars 9/9   CESAREAN SECTION N/A 12/10/2013   Procedure: REPEAT CESAREAN SECTION;  Surgeon: [name], MD;  Location: [facility];  Service: Obstetrics;  Laterality: N/A;   CESAREAN SECTION     colonoscopy  11/23/2018   stated had issues with sleep when in for procedure    ESOPHAGOGASTRODUODENOSCOPY (EGD) WITH PROPOFOL N/A 08/27/2021   Procedure: ESOPHAGOGASTRODUODENOSCOPY (EGD) WITH PROPOFOL;  Surgeon: Yetta Flock, MD;  Location: [facility];  Service: Gastroenterology;  Laterality: N/A;   OTHER SURGICAL HISTORY  2005   Uterine surgery    uterine cauterization     Patient Active Problem List   Diagnosis Date Noted   Type 2 diabetes mellitus with obesity ([place]) 03/25/2022   Hypotension - iatrogenic 03/18/2022   Other chronic pain 02/02/2022   Dyspepsia    Early satiety    IDA (iron deficiency anemia) 01/29/2021   Paroxysmal supraventricular tachycardia 10/01/2020   Class 1 obesity with serious comorbidity and body mass index (BMI) of 31.0 to 31.9 in adult 10/01/2020   Diabetes mellitus ([place]) 02/14/2020   Mixed diabetic hyperlipidemia associated with type 2 diabetes mellitus ([place]) 01/10/2020   Hypertension associated with diabetes ([place]) 01/10/2020   Diabetic neuropathy ([place]) 01/10/2020   Vitamin D deficiency 01/10/2020   Diabetes mellitus with proteinuria ([place]) [id number]   History of Helicobacter pylori infection 08/08/2019   Proteinuria 06/14/2019   Elevated sed rate 05/23/2019   Elevated C-reactive protein (CRP) 05/23/2019   Diabetes mellitus without complication (  [place]) 05/23/2019   Dyslipidemia (high LDL; low HDL) 05/23/2019   Type 2 diabetes mellitus with hyperglycemia, without long-term current use of insulin ([place]) 02/17/2019   Hemoglobin A1C between 7% and 9% indicating borderline diabetic control ([place]) 02/17/2019   Breast cancer screening by mammogram 02/17/2019   Frequent headaches 02/14/2019   Anxiety 02/14/2019   Common migraine with intractable migraine 06/28/2017   Chest pain at rest 10/17/2016   Sinus tachycardia 08/18/2016   Prolonged Q-T interval on ECG 08/18/2016   Vertigo 07/22/2015   Abdominal discomfort 02/14/2015   Low back pain 02/14/2015   Abdominal pain 02/01/2015   Nausea without vomiting 02/01/2015    Environmental allergies 02/01/2015   Language barrier to communication 02/01/2015   Anemia 01/23/2015   Constipation 01/21/2015   Knee pain, bilateral 01/21/2015   Palpitations 09/18/2013   Shortness of breath 09/18/2013   Advanced maternal age in pregnancy in second trimester 06/19/2013   IBS (irritable bowel syndrome) 04/13/2003    PCP: Fenton Foy, NP  REFERRING PROVIDER: Fenton Foy, NP  REFERRING DIAG:  [phone] (ICD-10-CM) - Chronic right shoulder pain  M54.9,G89.29 (ICD-10-CM) - Chronic mid back pain  R42 (ICD-10-CM) - Vertigo    Rationale for Evaluation and Treatment: Rehabilitation  THERAPY DIAG:  Chronic right shoulder pain  Dizziness and giddiness  Muscle weakness (generalized)  ONSET DATE: 04/09/22  SUBJECTIVE:                                                                                                                                                                                           SUBJECTIVE STATEMENT: Dizzy a little bit, after last Friday my arm was red and hurting but it went away.   PERTINENT HISTORY:  Patient presents today for a follow-up visit.  Patient does complain of right ear pain with some dizziness x 1 month.  Upon exam ears appear normal.  Patient does also complain of ongoing right shoulder pain and mid back pain.  This has been a chronic issue.  We will get x-rays today.  Will refer patient for physical therapy. Denies f/c/s, n/v/d, hemoptysis, PND, leg swelling  PAIN:  Are you having pain? Yes: NPRS scale: 7/10 Pain location: R shoulder into arm, mid back Pain description: unable to describe Aggravating factors: housework Relieving factors: none  PRECAUTIONS: None  WEIGHT BEARING RESTRICTIONS: No  FALLS:  Has patient fallen in last 6 months? No  LIVING ENVIRONMENT: Lives with: lives with their family Lives in: House/apartment Stairs: No Has following equipment at home: None  OCCUPATION: N/A  PLOF:  Independent  PATIENT GOALS: Feel better  NEXT MD VISIT:  OBJECTIVE:   DIAGNOSTIC FINDINGS:  EXAM: RIGHT SHOULDER - 3 VIEWFINDINGS: There is no evidence of fracture or dislocation. There is no evidence of arthropathy or other focal bone abnormality. Soft tissues are unremarkable. THORACIC SPINE 3 VIEWS FINDINGS: There is no evidence of thoracic spine fracture. Alignment is normal. No other significant bone abnormalities are identified.   SCREENING FOR RED FLAGS: Bowel or bladder incontinence: No Spinal tumors: No Cauda equina syndrome: No Compression fracture: No Abdominal aneurysm: No  COGNITION: Overall cognitive status: Within functional limits for tasks assessed     SENSATION: Patient reports tingling and numbness in her toes, B.  MUSCLE LENGTH: Hamstrings: Right 75 deg; Left 65 deg   POSTURE: rounded shoulders, forward head, and flexed trunk   PALPATION: Tight and TTP on B cerv paraspinals, B up traps, R medial parascapular muscles, Ant cervical muscles, R > L, B pects, lower T spine and upper lumbar paraspinals  UE ROM: R shoulder P flex to 105, abd to 85 before it becomes painful  UE STRENGTH: R shoulder pain limits MMT  UE SPECIAL TESTS: R Hawkins, Neer, empty can test all (+), belly press test (-)  CERVICAL ROM: Limited by pain in all directions, mostly ext and B lateral flex  THORACIC MOBILITY: Limited due to pain.  LOWER EXTREMITY ROM: Mild tightness noted in R knee to chest  LOWER EXTREMITY STRENGTH: B hips and knee 3+/5  FUNCTIONAL ASSESSMENT: Quick DASH 71%  VESTIBULAR ASSESSMENT: TBD  GAIT: Distance walked: In clinic distances Assistive device utilized: None Level of assistance: Complete Independence Comments: WFL  TODAY'S TREATMENT:                                                                                                                              DATE:  06/04/22 UBE L1 x35mns  Seated rows and lat pull downs 10# 2x10   QuickDash- 61.4% Brandt-Daroff exercises x3 Horizontal abd red 2x10 Ball roll up wall x10   05/28/22 Horizontal canal testing- positive for dizziness on R side  BBQ roll  VOR x1 standing 80 bps, then 100bps VOR x2 standing 80 bps Shoulder flexion 2# x10 Shoulder abd 2# x10  Rows and ext red 2x10 MH to R shoulder and upper trap 10 mins   05/19/22 L Epley- eyelids fluttered, and dizziness with each position STM to R supraspinatus, long head of biceps, pects, F/B stretch and humeral mobs in inf and post glide Shoulder retraction/depression in sitting with hands behind hips x 10 Scap protraction against G tband x 10, wall slides into flex x 10-painful VOR x 1 no dizziness hor or vert VOR x 2 dizziness reported   05/14/22 L Epley- negative but dizziness when sitting back up, goes away <20s  Saccades looking back and forth 2 targets- dizziness with vertical movements after stopping VOR x1 horizontal and vertical 20 reps- a little dizzy after looking vertically VOR x2 dizzy after 6 reps horizontal but second rep able  to do 20 VOR x2 dizzy after 10 vertical reps, then does 20 but dizzy looking up STM with gun to bilateral UT  Manual cervical traction and STM to suboccipitals  Supine shoulder flexion 2# 2x10 Chest press 2# 2x10 PROM to R shoulder all directions and lateral distraction   05/12/22 Vestibular assessment- Observation- eyes aligned, tracking WNL Saccades- increased dizziness reported after 6 reps, Convergence-Dizziness reported at 15" VOR-(+) horizontal, (-) vertical VOR cancellation (-) horizontal, deferred Vert due to fatigue.  Orthostatic BPs Supine- 99/79 HR 81BPM Sitting- 117/86 HR 82 BPM, 3 minutes- 102/85 HR81 Stand- 98/82 HR 84, 3 minutes- 102/86 HR 90  BPPV Dix-Hallpike (-) R, (+) L Performed L Epley maneuver with residual dizziness upon sitting.   UBE L1 x41mns forwards, unable to go backwards Standing rows red 2x10 Standing ext yellow 2x10 ER  scaption with red 2x10 Shoulder flexion 2# WaTE 2x10  Shoulder ext with 2# WaTE 2x10 STM to c[facility]spine and neck [facility]  Patient education  PATIENT EDUCATION:  Education details: POC, Plan to assess vestibular system Person educated: Patient and interpreter Education method: Explanation Education comprehension: verbalized understanding  HOME EXERCISE PROGRAM: [id number] ASSESSMENT:  CLINICAL IMPRESSION: Patient is tired today, states she did not get to have breakfast or her morning tea. She continues to have high pain levels for her shoulder. The dizziness is a bit better. We tried some Brandt-Daroff exercises and she only has dizziness when sitting back up to come to the middle. Patient inquired about dry needling and would like to try it to tight muscles in upper traps and maybe for headaches. She states she would like to be transferred to a closer clinic [place]m [place] for her.   OBJECTIVE IMPAIRMENTS: decreased activity tolerance, decreased coordination, decreased mobility, decreased ROM, decreased strength, increased muscle spasms, impaired flexibility, impaired UE functional use, improper body mechanics, postural dysfunction, and pain.   ACTIVITY LIMITATIONS: carrying, lifting, bending, sitting, squatting, sleeping, bathing, dressing, reach over head, hygiene/grooming, and locomotion level  PARTICIPATION LIMITATIONS: meal prep, cleaning, laundry, and shopping  PERSONAL FACTORS: Past/current experiences are also affecting patient's functional outcome.   REHAB POTENTIAL: Good  CLINICAL DECISION MAKING: Evolving/moderate complexity  EVALUATION COMPLEXITY: Moderate   GOALS: Goals reviewed with patient? Yes  SHORT TERM GOALS: Target date: 04/27/22  I with initial HEP Baseline: Goal status: ongoing  LONG TERM GOALS: Target date: 07/20/22  I with final HEP Baseline:  Goal status: 05/19/22-ongoing  2.  Patient  will be able to perform her normal daily activities including housekeeping, cooking, self car, with R shoulder pain < 4/10 Baseline:  Goal status: 05/19/22- ongoing  3.  Increase R shoulder strength to at least 4/5 Baseline:  Goal status: IN PROGRESS  4.  Increase BLE strength to at least 4/5 Baseline:  Goal status: ongoing  5.  Patient will report improvement in shoulder and back pain by at least 50% Baseline:  Goal status: INITIAL  6.  Patient will report no dizziness while moving about throughout the day Baseline:  Goal status: INITIAL  PLAN:  PT FREQUENCY: 2x/week  PT DURATION: 12 weeks  PLANNED INTERVENTIONS: Therapeutic exercises, Therapeutic activity, Neuromuscular re-education, Balance training, Gait training, Patient/Family education, Self Care, Joint mobilization, Vestibular training, Canalith repositioning, Dry Needling, Electrical stimulation, Spinal mobilization, Cryotherapy, Moist heat, Taping, Vasopneumatic device, Traction, Ionotophoresis '4mg'$ /ml Dexamethasone, and Manual therapy.  PLAN FOR NEXT SESSION: shoulder mobilization and strength, vestibular  re-ed with VOR, re-assess L horizontal canal, maybe cervical traction. Assess tolerance to HEP for vestibular re-ed  Andris Baumann, PT, DPT 06/04/22 10:16 AM

## 2022-06-07 ENCOUNTER — Ambulatory Visit: Payer: Medicaid Other | Admitting: Podiatry

## 2022-06-07 DIAGNOSIS — I70242 Atherosclerosis of native arteries of left leg with ulceration of calf: Secondary | ICD-10-CM

## 2022-06-07 DIAGNOSIS — I70222 Atherosclerosis of native arteries of extremities with rest pain, left leg: Secondary | ICD-10-CM

## 2022-06-07 NOTE — Telephone Encounter (Signed)
Patient's husband is returning call. He requested that the call be returned to [phone] with another interpreter.

## 2022-06-07 NOTE — Progress Notes (Signed)
Chief Complaint  Patient presents with   Capsultis    Rm 9 Follow up left foot pain. Pt states burning pains came back 1 month ago. Pt went to her PCP where she was prescribed Gabapentin for the burning and numbness of both ball of feet and toes. Pt states Gabapentin only makes her sleepy and does not help with the pain.     HPI: 47 y.o. female presenting today for follow-up evaluation of pain and tenderness associated to the bilateral feet.  Patient states that she went to her PCP for burning and numbness of both feet and gabapentin was prescribed.  She says it does not help with the pain.  She does have a PMHx diabetes mellitus which is controlled.  Last A1c 04/09/2022 was 5.1.  Patient also relates a history of lower back pathology and pain.  She says it has been several years since she has had her lower back evaluated  Past Medical History:  Diagnosis Date   Allergy    Anemia    Anxiety 01/2019   Asthma    B12 deficiency    Back pain    Common migraine with intractable migraine 06/28/2017   Constipation    Diabetes ([place]) 02/2019   Fatigue    GERD (gastroesophageal reflux disease)    pos H pylori   Hypertension    controlled with medication   IBS (irritable bowel syndrome) 2005   Joint pain    Neonatal death    Vaginal delivery, full term-lived x2 hours.    Palpitations    Shortness of breath    Shortness of breath on exertion    Spinal headache    Swelling of both lower extremities    Valvular heart disease    Vitamin D deficiency 02/2019    Past Surgical History:  Procedure Laterality Date   ADENOIDECTOMY     and tonsils (as a child)   BIOPSY  08/27/2021   Procedure: BIOPSY;  Surgeon: Yetta Flock, MD;  [facility]: [facility];  Service: Gastroenterology;;   boil  2003   right elbow   CESAREAN SECTION     x4   CESAREAN SECTION  06/02/2011   Procedure: CESAREAN SECTION;  Surgeon: [name], MD;  Location: [facility];  Service: Gynecology;  Laterality:  N/A;  Primary Cesarean Section Delivery Baby Boy @ 0004, Apgars 9/9   CESAREAN SECTION N/A 12/10/2013   Procedure: REPEAT CESAREAN SECTION;  Surgeon: [name], MD;  Location: [facility];  Service: Obstetrics;  Laterality: N/A;   CESAREAN SECTION     colonoscopy  11/23/2018   stated had issues with sleep when in for procedure   ESOPHAGOGASTRODUODENOSCOPY (EGD) WITH PROPOFOL N/A 08/27/2021   Procedure: ESOPHAGOGASTRODUODENOSCOPY (EGD) WITH PROPOFOL;  Surgeon: Yetta Flock, MD;  [facility];  Service: Gastroenterology;  Laterality: N/A;   OTHER SURGICAL HISTORY  2005   Uterine surgery    uterine cauterization      Allergies  Allergen Reactions   Topamax [Topiramate] Itching and Rash     Physical Exam: General: The patient is alert and oriented x3 in no acute distress.  Dermatology: Skin is warm, dry and supple bilateral lower extremities. Negative for open lesions or macerations.  Vascular: There is some delayed capillary refill to the toes.  Skin is cool to touch.  Unable to physically palpate pulses.  Being diabetic I do believe it is appropriate for ABIs to be ordered  Neurological: Light touch and protective threshold grossly intact  Musculoskeletal Exam: No pedal deformities noted  Assessment: 1. H/o lumbar radiculopathy 2.  Diabetes mellitus; controlled 3.  Concern for possible PVD -Patient evaluated -Order placed for ABIs bilateral lower extremities to establish a vascular baseline.  If abnormal referral to vascular for consult -I also explained that the patient's symptoms may be coming from a lumbar radiculopathy.  If ABIs are WNL I would recommend follow-up with neuro spine specialist -Will plan to discuss results of ABIs with the patient via telephone -Return to clinic as needed       Edrick Kins, DPM [facility]  Dr. Edrick Kins, DPM    [address], [place] [address]                 Office [phone]  Fax [phone]

## 2022-06-07 NOTE — Telephone Encounter (Signed)
Attempted phone call to pt using interpreter line with interpreter # [phone].  No answer.  Interpreter left a voicemail stating pt may return call to [phone].  Will also reach out to pt via MyChart.

## 2022-06-08 ENCOUNTER — Encounter: Payer: Self-pay | Admitting: Podiatry

## 2022-06-10 ENCOUNTER — Other Ambulatory Visit: Payer: Self-pay | Admitting: Gastroenterology

## 2022-06-11 ENCOUNTER — Other Ambulatory Visit: Payer: Self-pay | Admitting: Podiatry

## 2022-06-11 DIAGNOSIS — I70222 Atherosclerosis of native arteries of extremities with rest pain, left leg: Secondary | ICD-10-CM

## 2022-06-16 ENCOUNTER — Ambulatory Visit: Payer: Medicaid Other | Admitting: Physical Therapy

## 2022-06-21 ENCOUNTER — Encounter (INDEPENDENT_AMBULATORY_CARE_PROVIDER_SITE_OTHER): Payer: Self-pay | Admitting: Adult Health

## 2022-06-21 ENCOUNTER — Other Ambulatory Visit: Payer: Self-pay | Admitting: Podiatry

## 2022-06-21 ENCOUNTER — Ambulatory Visit (INDEPENDENT_AMBULATORY_CARE_PROVIDER_SITE_OTHER): Payer: Medicaid Other | Admitting: Adult Health

## 2022-06-21 ENCOUNTER — Ambulatory Visit (HOSPITAL_COMMUNITY)
Admission: RE | Admit: 2022-06-21 | Discharge: 2022-06-21 | Disposition: A | Discharge status: Home / Self Care | Payer: Medicaid Other | Source: Ambulatory Visit | Attending: Internal Medicine | Admitting: Internal Medicine

## 2022-06-21 VITALS — BP 101/68 | HR 103 | Temp 98.4°F | Ht <= 58 in | Wt 126.0 lb

## 2022-06-21 DIAGNOSIS — I70222 Atherosclerosis of native arteries of extremities with rest pain, left leg: Secondary | ICD-10-CM | POA: Insufficient documentation

## 2022-06-21 DIAGNOSIS — E1169 Type 2 diabetes mellitus with other specified complication: Secondary | ICD-10-CM | POA: Diagnosis not present

## 2022-06-21 DIAGNOSIS — E1351 Other specified diabetes mellitus with diabetic peripheral angiopathy without gangrene: Secondary | ICD-10-CM

## 2022-06-21 DIAGNOSIS — E1159 Type 2 diabetes mellitus with other circulatory complications: Secondary | ICD-10-CM

## 2022-06-21 DIAGNOSIS — E559 Vitamin D deficiency, unspecified: Secondary | ICD-10-CM | POA: Diagnosis not present

## 2022-06-21 DIAGNOSIS — Z6826 Body mass index (BMI) 26.0-26.9, adult: Secondary | ICD-10-CM

## 2022-06-21 DIAGNOSIS — E669 Obesity, unspecified: Secondary | ICD-10-CM | POA: Diagnosis not present

## 2022-06-21 DIAGNOSIS — Z7985 Long-term (current) use of injectable non-insulin antidiabetic drugs: Secondary | ICD-10-CM | POA: Diagnosis not present

## 2022-06-21 DIAGNOSIS — I152 Hypertension secondary to endocrine disorders: Secondary | ICD-10-CM | POA: Diagnosis not present

## 2022-06-21 LAB — VAS US ABI WITH/WO TBI
Left ABI: 1.05
Right ABI: 1.03

## 2022-06-21 MED ORDER — OZEMPIC (0.25 OR 0.5 MG/DOSE) 2 MG/1.5ML ~~LOC~~ SOPN: 0.5000 mg | PEN_INJECTOR | SUBCUTANEOUS | 0 refills | Status: DC

## 2022-06-21 NOTE — Progress Notes (Signed)
WEIGHT SUMMARY AND BIOMETRICS  Vitals Temp: 98.4 F (36.9 C) BP: 101/68 Pulse Rate: (!) 103 SpO2: 100 %   Anthropometric Measurements Height: '4\' 10"'$  (1.473 m) Weight: 126 lb (57.2 kg) BMI (Calculated): 26.34 Weight at Last Visit: 126lb Weight Lost Since Last Visit: 0 Starting Weight: 152lb Total Weight Loss (lbs): 26 lb (11.8 kg)   Body Composition  Body Fat %: 37.1 % Fat Mass (lbs): 46.8 lbs Muscle Mass (lbs): 75.2 lbs Total Body Water (lbs): 53.8 lbs Visceral Fat Rating : 7   Other Clinical Data Fasting: yes Labs: no Today's Visit #: 29 Starting Date: 01/10/20    Chief Complaint:   OBESITY Betty Cain is here to discuss her progress with her obesity treatment plan. She is on the the Category 2 Plan and states she is following her eating plan approximately 70 % of the time.  She states she is exercising 15 minutes 3 times per week.   Interim History:  Betty Cain has started observing Ramadan- will fast from sunrise to sunset.  Discussed at length and dietary restrictions r/t Ramadan, Per [place] interpretor- Chronic Medical conditions are recognized and she is ABLE to consume food is sx's of hypoglycemia develop. Betty Cain verbalized understanding and agreement.  Her Current Weight 126 lbs, with corresponding BMI 26.3 Her Goal Weight 120 lbs  She recently contacted her Cardiologist with concerns over CP and dyspnea with exertion. Cards referred her to PCP or GI. She has appt with PCP on 07/08/2022 Discussed red flag sx's and if any develop to seek immediate medical care. [name] was able to verbalize understanding via [place].   Of Note- [place] Interpretor at [facility] during [place].  Subjective:   1. Type 2 diabetes mellitus with obesity ([place]) Lab Results  Component Value Date   HGBA1C 5.1 04/09/2022   HGBA1C 4.9 12/28/2021   HGBA1C 5.0 08/19/2021   HGBA1C 5.0 08/19/2021   HGBA1C 5.0 (A) 08/19/2021   HGBA1C 5.0 08/19/2021    She is on weekly Ozempic 0.'5mg'$  - Denies mass in neck, dysphagia, dyspepsia, persistent hoarseness, abdominal pain, or N/V/C  She reports constipation is NOT worsening. She reports fasting BG 80-90s, denies sx's of hypoglycemia.  2. Vitamin D deficiency  Latest Reference Range & Units 12/28/21 09:28  Vitamin D, 25-Hydroxy 30.0 - 100.0 ng/mL 35.6    3. Hypertension associated with diabetes ([place]) BP stable She denies sx's of hypoglycemia  Assessment/Plan:   1. Type 2 diabetes mellitus with obesity (HCC) Refill - Semaglutide,0.25 or 0.'5MG'$ /DOS, (OZEMPIC, 0.25 OR 0.5 MG/DOSE,) 2 MG/1.5ML SOPN; Inject 0.5 mg into the skin once a week.  Dispense: 3 mL; Refill: 0 - Hemoglobin A1c - Insulin, random  Discussed at length and dietary restrictions r/t Ramadan, Per [place] interpretor- Chronic Medical conditions are recognized and she is ABLE to consume food is sx's of hypoglycemia develop. Betty Cain verbalized understanding and agreement.  2. Vitamin D deficiency Check Labs - VITAMIN D 25 Hydroxy (Vit-D Deficiency, Fractures)  3. Hypertension associated with diabetes ([place]) Check Labs - Comprehensive metabolic panel  4. Obesity, current BMI 26.3 [place] is currently in the action stage of change. As such, her goal is to continue with weight loss efforts. She has agreed to the Category 2 Plan.   Exercise goals: For substantial health benefits, adults should do at least 150 minutes (2 hours and 30 minutes) a week of moderate-intensity, or 75 minutes (1 hour and 15 minutes) a week of vigorous-intensity aerobic  physical activity, or an equivalent combination of moderate- and vigorous-intensity aerobic activity. Aerobic activity should be performed in episodes of at least 10 minutes, and preferably, it should be spread throughout the week.  Behavioral modification strategies: increasing lean protein intake, decreasing simple carbohydrates, increasing vegetables, increasing water intake, no  skipping meals, meal planning and cooking strategies, holiday eating strategies , and planning for success.  [name] has agreed to follow-up with our clinic in 4 weeks. She was informed of the importance of frequent follow-up visits to maximize her success with intensive lifestyle modifications for her multiple health conditions.   Betty Cain was informed we would discuss her lab results at her next visit unless there is a critical issue that needs to be addressed sooner. Betty Cain agreed to keep her next visit at the agreed upon time to discuss these results.  Objective:   Blood pressure 101/68, pulse (!) 103, temperature 98.4 F (36.9 C), height '4\' 10"'$  (1.473 m), weight 126 lb (57.2 kg), SpO2 100 %. Body mass index is 26.33 kg/m.  General: Cooperative, alert, well developed, in no acute distress. HEENT: Conjunctivae and lids unremarkable. Cardiovascular: Regular rhythm.  Lungs: Normal work of breathing. Neurologic: No focal deficits.   Lab Results  Component Value Date   CREATININE 0.64 04/09/2022   BUN 23 04/09/2022   NA 144 04/09/2022   K 4.4 04/09/2022   CL 105 04/09/2022   CO2 21 04/09/2022   Lab Results  Component Value Date   ALT 19 04/09/2022   AST 19 04/09/2022   ALKPHOS 92 04/09/2022   BILITOT 0.4 04/09/2022   Lab Results  Component Value Date   HGBA1C 5.1 04/09/2022   HGBA1C 4.9 12/28/2021   HGBA1C 5.0 08/19/2021   HGBA1C 5.0 08/19/2021   HGBA1C 5.0 (A) 08/19/2021   HGBA1C 5.0 08/19/2021   Lab Results  Component Value Date   INSULIN 3.8 12/28/2021   INSULIN 8.7 01/10/2020   Lab Results  Component Value Date   TSH 2.530 12/28/2021   Lab Results  Component Value Date   CHOL 117 04/09/2022   HDL 44 04/09/2022   LDLCALC 58 04/09/2022   TRIG 75 04/09/2022   CHOLHDL 2.7 04/09/2022   Lab Results  Component Value Date   VD25OH 35.6 12/28/2021   VD25OH 22.7 (L) 10/09/2021   VD25OH 30.0 01/01/2021   Lab Results  Component Value Date   WBC 8.7 04/09/2022   HGB  12.5 04/09/2022   HCT 37.7 04/09/2022   MCV 91 04/09/2022   PLT 287 04/09/2022   Lab Results  Component Value Date   IRON 83 05/19/2021   TIBC 230 (L) 05/19/2021   FERRITIN 478 (H) 05/19/2021    Attestation Statements:   Reviewed by clinician on day of visit: allergies, medications, problem list, medical history, surgical history, family history, social history, and previous encounter notes.  I have reviewed the above documentation for accuracy and completeness, and I agree with the above. -  Carol Theys d. Mackenzy Eisenberg, NP-C

## 2022-06-21 NOTE — Progress Notes (Signed)
Referral placed to vein and vascular for evaluation and consultation secondary to abnormal ABIs.

## 2022-06-22 LAB — COMPREHENSIVE METABOLIC PANEL
ALT: 15 IU/L (ref 0–32)
AST: 16 IU/L (ref 0–40)
Albumin/Globulin Ratio: 1.9 (ref 1.2–2.2)
Albumin: 5.2 g/dL — ABNORMAL HIGH (ref 3.9–4.9)
Alkaline Phosphatase: 85 IU/L (ref 44–121)
BUN/Creatinine Ratio: 30 — ABNORMAL HIGH (ref 9–23)
BUN: 18 mg/dL (ref 6–24)
Bilirubin Total: 0.4 mg/dL (ref 0.0–1.2)
CO2: 21 mmol/L (ref 20–29)
Calcium: 10 mg/dL (ref 8.7–10.2)
Chloride: 103 mmol/L (ref 96–106)
Creatinine, Ser: 0.61 mg/dL (ref 0.57–1.00)
Globulin, Total: 2.7 g/dL (ref 1.5–4.5)
Glucose: 69 mg/dL — ABNORMAL LOW (ref 70–99)
Potassium: 4.3 mmol/L (ref 3.5–5.2)
Sodium: 142 mmol/L (ref 134–144)
Total Protein: 7.9 g/dL (ref 6.0–8.5)
eGFR: 113 mL/min/{1.73_m2} (ref >59)

## 2022-06-22 LAB — INSULIN, RANDOM: INSULIN: 7.9 u[IU]/mL (ref 2.6–24.9)

## 2022-06-22 LAB — HEMOGLOBIN A1C
Est. average glucose Bld gHb Est-mCnc: 103 mg/dL
Hgb A1c MFr Bld: 5.2 % (ref 4.8–5.6)

## 2022-06-22 LAB — VITAMIN D 25 HYDROXY (VIT D DEFICIENCY, FRACTURES): Vit D, 25-Hydroxy: 51.2 ng/mL (ref 30.0–100.0)

## 2022-06-24 ENCOUNTER — Ambulatory Visit: Payer: Medicaid Other | Attending: Nurse Practitioner

## 2022-06-24 ENCOUNTER — Other Ambulatory Visit: Payer: Self-pay | Admitting: Nurse Practitioner

## 2022-06-24 DIAGNOSIS — M25511 Pain in right shoulder: Secondary | ICD-10-CM | POA: Insufficient documentation

## 2022-06-24 DIAGNOSIS — R42 Dizziness and giddiness: Secondary | ICD-10-CM | POA: Diagnosis not present

## 2022-06-24 DIAGNOSIS — G8929 Other chronic pain: Secondary | ICD-10-CM | POA: Diagnosis not present

## 2022-06-24 DIAGNOSIS — M6281 Muscle weakness (generalized): Secondary | ICD-10-CM | POA: Diagnosis not present

## 2022-06-24 NOTE — Telephone Encounter (Signed)
Please advise KH 

## 2022-06-24 NOTE — Therapy (Signed)
OUTPATIENT PHYSICAL THERAPY THORACOLUMBAR TREATMENT   Patient Name: Betty Cain MRN: [id number] DOB:[birth year], 47 y.o., female Today's Date: 06/24/2022  END OF SESSION:  PT End of Session - 06/24/22 0841     Visit Number 8    Date for PT Re-Evaluation 07/20/22    Authorization Type M[employer] healthy blue    Authorization Time Period 2/24-4/23/24    Authorization - Visit Number 1    Authorization - Number of Visits 5    PT Start Time 0845    PT Stop Time 0928    PT Time Calculation (min) 43 min    Activity Tolerance Patient tolerated treatment well    Behavior During Therapy [facility] for tasks assessed/performed                   Past Medical History:  Diagnosis Date   Allergy    Anemia    Anxiety 01/2019   Asthma    B12 deficiency    Back pain    Common migraine with intractable migraine 06/28/2017   Constipation    Diabetes ([place]) 02/2019   Fatigue    GERD (gastroesophageal reflux disease)    pos H pylori   Hypertension    controlled with medication   IBS (irritable bowel syndrome) 2005   Joint pain    Neonatal death    Vaginal delivery, full term-lived x2 hours.    Palpitations    Shortness of breath    Shortness of breath on exertion    Spinal headache    Swelling of both lower extremities    Valvular heart disease    Vitamin D deficiency 02/2019   Past Surgical History:  Procedure Laterality Date   ADENOIDECTOMY     and tonsils (as a child)   BIOPSY  08/27/2021   Procedure: BIOPSY;  Surgeon: Yetta Flock, MD;  Location: [facility];  Service: Gastroenterology;;   boil  2003   right elbow   CESAREAN SECTION     x4   CESAREAN SECTION  06/02/2011   Procedure: CESAREAN SECTION;  Surgeon: [name], MD;  Location: [facility];  Service: Gynecology;  Laterality: N/A;  Primary Cesarean Section Delivery Baby Boy @ 0004, Apgars 9/9   CESAREAN SECTION N/A 12/10/2013   Procedure: REPEAT CESAREAN SECTION;  Surgeon: [name], MD;  Location: [facility];   Service: Obstetrics;  Laterality: N/A;   CESAREAN SECTION     colonoscopy  11/23/2018   stated had issues with sleep when in for procedure   ESOPHAGOGASTRODUODENOSCOPY (EGD) WITH PROPOFOL N/A 08/27/2021   Procedure: ESOPHAGOGASTRODUODENOSCOPY (EGD) WITH PROPOFOL;  Surgeon: Yetta Flock, MD;  Location: [facility];  Service: Gastroenterology;  Laterality: N/A;   OTHER SURGICAL HISTORY  2005   Uterine surgery    uterine cauterization     Patient Active Problem List   Diagnosis Date Noted   Type 2 diabetes mellitus with obesity ([place]) 03/25/2022   Hypotension - iatrogenic 03/18/2022   Other chronic pain 02/02/2022   Dyspepsia    Early satiety    IDA (iron deficiency anemia) 01/29/2021   Paroxysmal supraventricular tachycardia 10/01/2020   Class 1 obesity with serious comorbidity and body mass index (BMI) of 31.0 to 31.9 in adult 10/01/2020   Diabetes mellitus ([place]) 02/14/2020   Mixed diabetic hyperlipidemia associated with type 2 diabetes mellitus ([place]) 01/10/2020   Hypertension associated with diabetes ([place]) 01/10/2020   Diabetic neuropathy ([place]) 01/10/2020   Vitamin D deficiency 01/10/2020   Diabetes mellitus with proteinuria (  [place]) [id number]   History of Helicobacter pylori infection 08/08/2019   Proteinuria 06/14/2019   Elevated sed rate 05/23/2019   Elevated C-reactive protein (CRP) 05/23/2019   Diabetes mellitus without complication ([place]) [id number]   Dyslipidemia (high LDL; low HDL) 05/23/2019   Type 2 diabetes mellitus with hyperglycemia, without long-term current use of insulin ([place]) 02/17/2019   Hemoglobin A1C between 7% and 9% indicating borderline diabetic control ([place]) 02/17/2019   Breast cancer screening by mammogram 02/17/2019   Frequent headaches 02/14/2019   Anxiety 02/14/2019   Common migraine with intractable migraine 06/28/2017   Chest pain at rest 10/17/2016   Sinus tachycardia 08/18/2016   Prolonged Q-T interval on ECG 08/18/2016   Vertigo 07/22/2015    Abdominal discomfort 02/14/2015   Low back pain 02/14/2015   Abdominal pain 02/01/2015   Nausea without vomiting 02/01/2015   Environmental allergies 02/01/2015   Language barrier to communication 02/01/2015   Anemia 01/23/2015   Constipation 01/21/2015   Knee pain, bilateral 01/21/2015   Palpitations 09/18/2013   Shortness of breath 09/18/2013   Advanced maternal age in pregnancy in second trimester 06/19/2013   IBS (irritable bowel syndrome) 04/13/2003    PCP: Fenton Foy, NP  REFERRING PROVIDER: Fenton Foy, NP  REFERRING DIAG:  [phone] (ICD-10-CM) - Chronic right shoulder pain  M54.9,G89.29 (ICD-10-CM) - Chronic mid back pain  R42 (ICD-10-CM) - Vertigo    Rationale for Evaluation and Treatment: Rehabilitation  THERAPY DIAG:  Chronic right shoulder pain  Dizziness and giddiness  Muscle weakness (generalized)  ONSET DATE: 04/09/22  SUBJECTIVE:                                                                                                                                                                                          In-person interpreter  SUBJECTIVE STATEMENT: "I am feeling the same. Pain is all over the arm and back ache and dizziness." She reports "a little bit" of improvement since starting PT. She reports dizziness when looking up and when riding in the car but this is better since she started PT.   PERTINENT HISTORY:  Patient presents today for a follow-up visit.  Patient does complain of right ear pain with some dizziness x 1 month.  Upon exam ears appear normal.  Patient does also complain of ongoing right shoulder pain and mid back pain.  This has been a chronic issue.  We will get x-rays today.  Will refer patient for physical therapy. Denies f/c/s, n/v/d, hemoptysis, PND, leg swelling  PAIN:  Are you having pain? Yes: NPRS scale: 8/10 Pain location: Rt upper trap to palmar aspect of the hand  Pain description: "pressure"   Aggravating factors: housework Relieving factors: none  PRECAUTIONS: None  WEIGHT BEARING RESTRICTIONS: No  FALLS:  Has patient fallen in last 6 months? No  LIVING ENVIRONMENT: Lives with: lives with their family Lives in: House/apartment Stairs: No Has following equipment at home: None  OCCUPATION: N/A  PLOF: Independent  PATIENT GOALS: Feel better   OBJECTIVE:   DIAGNOSTIC FINDINGS:  EXAM: RIGHT SHOULDER - 3 VIEWFINDINGS: There is no evidence of fracture or dislocation. There is no evidence of arthropathy or other focal bone abnormality. Soft tissues are unremarkable. THORACIC SPINE 3 VIEWS FINDINGS: There is no evidence of thoracic spine fracture. Alignment is normal. No other significant bone abnormalities are identified.   SCREENING FOR RED FLAGS: Bowel or bladder incontinence: No Spinal tumors: No Cauda equina syndrome: No Compression fracture: No Abdominal aneurysm: No  COGNITION: Overall cognitive status: Within functional limits for tasks assessed     SENSATION: Patient reports tingling and numbness in her toes, B.  MUSCLE LENGTH: Hamstrings: Right 75 deg; Left 65 deg   POSTURE: rounded shoulders, forward head, and flexed trunk   PALPATION: Tight and TTP on B cerv paraspinals, B up traps, R medial parascapular muscles, Ant cervical muscles, R > L, B pects, lower T spine and upper lumbar paraspinals  UE ROM: R shoulder P flex to 105, abd to 85 before it becomes painful  UPPER EXTREMITY ROM:  Active ROM Right 06/24/22 Left eval  Shoulder flexion Full pn    Shoulder extension    Shoulder abduction Full pn   Shoulder adduction    Shoulder extension    Shoulder internal rotation L5 pn   Shoulder external rotation T3 pn   Elbow flexion    Elbow extension    Wrist flexion    Wrist extension    Wrist ulnar deviation    Wrist radial deviation    Wrist pronation    Wrist supination     (Blank rows = not tested)  UE STRENGTH: R shoulder  pain limits MMT  UE SPECIAL TESTS: R Hawkins, Neer, empty can test all (+), belly press test (-)  CERVICAL ROM: Limited by pain in all directions, mostly ext and B lateral flex   Active ROM A/PROM 06/24/22  Flexion WNL  Extension WNL increased RUE pain  Right lateral flexion WNL  Left lateral flexion WNL  Right rotation WNL pain Rt upper trap  Left rotation WNL pain Rt upper trap    (Blank rows = not tested)  06/24/22:   THORACIC MOBILITY: Limited due to pain.  LOWER EXTREMITY ROM: Mild tightness noted in R knee to chest  LOWER EXTREMITY STRENGTH: B hips and knee 3+/5  FUNCTIONAL ASSESSMENT: Quick DASH 71%  VESTIBULAR ASSESSMENT: TBD  GAIT: Distance walked: In clinic distances Assistive device utilized: None Level of assistance: Complete Independence Comments: [facility] [place] Adult PT Treatment:                                                DATE: 06/24/22 Therapeutic Exercise: Seated cervical retraction x  10  Supine cervical retraction 2 x 10  Seated scapular retraction 2 x 10  Pec doorway stretch 2 x 30 sec  Updated HEP  Manual Therapy: Gentle cervical distraction STM/trigger point release Rt upper trap/levator scapulae    06/04/22 UBE L1 x36mns  Seated rows and lat pull  downs 10# 2x10  QuickDash- 61.4% Brandt-Daroff exercises x3 Horizontal abd red 2x10 Ball roll up wall x10   05/28/22 Horizontal canal testing- positive for dizziness on R side  BBQ roll  VOR x1 standing 80 bps, then 100bps VOR x2 standing 80 bps Shoulder flexion 2# x10 Shoulder abd 2# x10  Rows and ext red 2x10 MH to R shoulder and upper trap 10 mins   05/19/22 L Epley- eyelids fluttered, and dizziness with each position STM to R supraspinatus, long head of biceps, pects, F/B stretch and humeral mobs in inf and post glide Shoulder retraction/depression in sitting with hands behind hips x 10 Scap protraction against G tband x 10, wall slides into flex x 10-painful VOR x 1 no dizziness hor or  vert VOR x 2 dizziness reported   05/14/22 L Epley- negative but dizziness when sitting back up, goes away <20s  Saccades looking back and forth 2 targets- dizziness with vertical movements after stopping VOR x1 horizontal and vertical 20 reps- a little dizzy after looking vertically VOR x2 dizzy after 6 reps horizontal but second rep able to do 20 VOR x2 dizzy after 10 vertical reps, then does 20 but dizzy looking up STM with gun to bilateral UT  Manual cervical traction and STM to suboccipitals  Supine shoulder flexion 2# 2x10 Chest press 2# 2x10 PROM to R shoulder all directions and lateral distraction   PATIENT EDUCATION:  Education details: HEP Person educated: Patient and interpreter Education method: Explanation, Demonstration, Tactile cues, Verbal cues, and Handouts Education comprehension: verbalized understanding, returned demonstration, verbal cues required, tactile cues required, and needs further education  HOME EXERCISE PROGRAM: Access Code: [email or web address] URL: [email or web address] Date: 06/24/2022 Prepared by: Gwendolyn [place]  Exercises - Seated Scapular Retraction  - 2 x daily - 7 x weekly - 2 reps - 10 hold - Supine Cervical Retraction with Towel  - 2 x daily - 7 x weekly - 2 sets - 10 reps - Doorway Pec Stretch at 60 Elevation  - 1 x daily - 7 x weekly - 3 reps - 30 sec  hold - Seated Gaze Stabilization with Head Rotation and Horizontal Arm Movement  - 1 x daily - 7 x weekly - 2 sets - 20 reps - Seated Gaze Stabilization with Head Nod and Vertical Arm Movement  - 1 x daily - 7 x weekly - 2 sets - 20 reps  ASSESSMENT:  CLINICAL IMPRESSION: Patient arrives for first PT visit at our location after completing 7 PT visits at a different location for her chronic Rt shoulder/back pain and dizziness. She has good cervical AROM, but reports an increase in RUE pain with cervical extension and rotational movement. She also has good Rt shoulder AROM, but endorses an  increase in Rt shoulder pain at end range of all motions. She does appear to respond positively to supine cervical retraction and manual cervical distraction reporting a decrease in intensity of her RUE pain following these interventions. HEP was updated with patient encouraged to complete all prescribed exercises as she admits to not completing any exercises over the past 2 weeks.   OBJECTIVE IMPAIRMENTS: decreased activity tolerance, decreased coordination, decreased mobility, decreased ROM, decreased strength, increased muscle spasms, impaired flexibility, impaired UE functional use, improper body mechanics, postural dysfunction, and pain.   ACTIVITY LIMITATIONS: carrying, lifting, bending, sitting, squatting, sleeping, bathing, dressing, reach over head, hygiene/grooming, and locomotion level  PARTICIPATION LIMITATIONS: meal prep, cleaning, laundry, and shopping  PERSONAL FACTORS: Past/current experiences are also affecting  patient's functional outcome.   REHAB POTENTIAL: Good  CLINICAL DECISION MAKING: Evolving/moderate complexity  EVALUATION COMPLEXITY: Moderate   GOALS: Goals reviewed with patient? Yes  SHORT TERM GOALS: Target date: 04/27/22  I with initial HEP Baseline: Goal status: ongoing  LONG TERM GOALS: Target date: 07/20/22  I with final HEP Baseline:  Goal status: 05/19/22-ongoing  2.  Patient will be able to perform her normal daily activities including housekeeping, cooking, self car, with R shoulder pain < 4/10 Baseline:  Goal status: 05/19/22- ongoing  3.  Increase R shoulder strength to at least 4/5 Baseline:  Goal status: IN PROGRESS  4.  Increase BLE strength to at least 4/5 Baseline:  Goal status: ongoing  5.  Patient will report improvement in shoulder and back pain by at least 50% Baseline:  Goal status: INITIAL  6.  Patient will report no dizziness while moving about throughout the day Baseline:  Goal status: INITIAL  PLAN:  PT FREQUENCY:  2x/week  PT DURATION: 12 weeks  PLANNED INTERVENTIONS: Therapeutic exercises, Therapeutic activity, Neuromuscular re-education, Balance training, Gait training, Patient/Family education, Self Care, Joint mobilization, Vestibular training, Canalith repositioning, Dry Needling, Electrical stimulation, Spinal mobilization, Cryotherapy, Moist heat, Taping, Vasopneumatic device, Traction, Ionotophoresis '4mg'$ /ml Dexamethasone, and Manual therapy.  PLAN FOR NEXT SESSION:  vestibular re-ed with VOR, re-assess L horizontal canal. Review and progress HEP PRN; postural strengthening, consider nerve glides, repeated cervical movement.   Gwendolyn [place], PT, DPT, ATC 06/24/22 9:30 AM

## 2022-06-29 ENCOUNTER — Encounter: Payer: Self-pay | Admitting: Vascular Surgery

## 2022-06-29 ENCOUNTER — Ambulatory Visit: Payer: Medicaid Other | Admitting: Vascular Surgery

## 2022-06-29 VITALS — BP 95/69 | HR 90 | Temp 98.4°F | Resp 14 | Ht <= 58 in | Wt 131.0 lb

## 2022-06-29 DIAGNOSIS — I739 Peripheral vascular disease, unspecified: Secondary | ICD-10-CM | POA: Diagnosis not present

## 2022-06-29 NOTE — Progress Notes (Signed)
Patient name: Betty Cain MRN: [id number] DOB: 10-Sep-1975 Sex: female  REASON FOR CONSULT: Abnormal ABI  HPI: Betty Cain is a 47 y.o. female, with hx DM and HTN that presents as a referral from podiatry for evaluation of abnormal ABIs.  Patient states she has been having pain on the bottom of both feet for the last 6 months.  She describes this on the ball of both feet.  She did have ABIs on 06/21/2022 that were 1.03 on the right triphasic and 1.05 on the left triphasic.  She has toe pressure of 0 bilaterally.  Has no wounds or distal ulcerations.  She denies any smoking or tobacco abuse.  Past Medical History:  Diagnosis Date   Allergy    Anemia    Anxiety 01/2019   Asthma    B12 deficiency    Back pain    Common migraine with intractable migraine 06/28/2017   Constipation    Diabetes ([place]) 02/2019   Fatigue    GERD (gastroesophageal reflux disease)    pos H pylori   Hypertension    controlled with medication   IBS (irritable bowel syndrome) 2005   Joint pain    Neonatal death    Vaginal delivery, full term-lived x2 hours.    Palpitations    Shortness of breath    Shortness of breath on exertion    Spinal headache    Swelling of both lower extremities    Valvular heart disease    Vitamin D deficiency 02/2019    Past Surgical History:  Procedure Laterality Date   ADENOIDECTOMY     and tonsils (as a child)   BIOPSY  08/27/2021   Procedure: BIOPSY;  Surgeon: Yetta Flock, MD;  Location: [facility];  Service: Gastroenterology;;   boil  2003   right elbow   CESAREAN SECTION     x4   CESAREAN SECTION  06/02/2011   Procedure: CESAREAN SECTION;  Surgeon: [name], MD;  Location: [facility];  Service: Gynecology;  Laterality: N/A;  Primary Cesarean Section Delivery Baby Boy @ 0004, Apgars 9/9   CESAREAN SECTION N/A 12/10/2013   Procedure: REPEAT CESAREAN SECTION;  Surgeon: [name], MD;  Location: [facility];  Service: Obstetrics;  Laterality: N/A;   CESAREAN  SECTION     colonoscopy  11/23/2018   stated had issues with sleep when in for procedure   ESOPHAGOGASTRODUODENOSCOPY (EGD) WITH PROPOFOL N/A 08/27/2021   Procedure: ESOPHAGOGASTRODUODENOSCOPY (EGD) WITH PROPOFOL;  Surgeon: Yetta Flock, MD;  Location: [facility];  Service: Gastroenterology;  Laterality: N/A;   OTHER SURGICAL HISTORY  2005   Uterine surgery    uterine cauterization      Family History  Problem Relation Age of Onset   Diabetes Mother    Diabetes Father    Diabetes Sister    Anesthesia problems Neg Hx    Colon cancer Neg Hx    Esophageal cancer Neg Hx    Rectal cancer Neg Hx    Stomach cancer Neg Hx    Colon polyps Neg Hx    Migraines Neg Hx     SOCIAL HISTORY: Social History   Socioeconomic History   Marital status: Married    Spouse name: [name]   Number of children: 4   Years of education: Not on file   Highest education level: Not on file  Occupational History   Occupation: Unemployed  Tobacco Use   Smoking status: Never   Smokeless tobacco: Never  Vaping Use  Vaping Use: Never used  Substance and Sexual Activity   Alcohol use: No   Drug use: No   Sexual activity: Yes    Birth control/protection: None    Comment: pregnant  Other Topics Concern   Not on file  Social History Narrative   Lives with husband and child   Caffeine use: daily (tea), sometimes coffee/soda   Right handed    Social Determinants of Health   Financial Resource Strain: Not on file  Food Insecurity: No Food Insecurity (03/12/2020)   Hunger Vital Sign    Worried About Running Out of Food in the Last Year: Never true    Ran Out of Food in the Last Year: Never true  Transportation Needs: Not on file  Physical Activity: Not on file  Stress: Not on file  Social Connections: Not on file  Intimate Partner Violence: Not on file    Allergies  Allergen Reactions   Topamax [Topiramate] Itching and Rash    Current Outpatient Medications  Medication Sig  Dispense Refill   Accu-Chek Softclix Lancets lancets USE UP TO FOUR TIMES DAILY AS DIRECTED 100 each 0   acetaminophen (TYLENOL) 500 MG tablet Take 500 mg by mouth every 6 (six) hours as needed.     bisoprolol (ZEBETA) 5 MG tablet Take 1 tablet (5 mg total) by mouth 2 (two) times daily. 180 tablet 3   blood glucose meter kit and supplies KIT Dispense based on patient and insurance preference. Use up to four times daily as directed. 1 each 0   Blood Pressure Monitoring DEVI 1 each by Does not apply route daily. 1 Device 0   cloNIDine (CATAPRES) 0.1 MG tablet Take 1 tablet (0.1 mg total) by mouth 2 (two) times daily. 60 tablet 11   ferrous sulfate (FEROSUL) 325 (65 FE) MG tablet TAKE 1 TABLET (325 MG TOTAL) BY MOUTH DAILY. 90 tablet 3   fexofenadine (ALLEGRA ALLERGY) 180 MG tablet Take 1 tablet (180 mg total) by mouth daily. 90 tablet 0   Fremanezumab-vfrm (AJOVY) 225 MG/1.5ML SOAJ Inject 225mg / 1.29ml in skin every 30 days 1.5 mL 0   gabapentin (NEURONTIN) 100 MG capsule Take 1 capsule (100 mg total) by mouth 3 (three) times daily. 90 capsule 3   glucose blood (ACCU-CHEK GUIDE) test strip Use as instructed 100 each 12   ivabradine (CORLANOR) 5 MG TABS tablet Take 3 tablets by mouth two hours prior to cardiac CT scan. 3 tablet 0   meloxicam (MOBIC) 15 MG tablet Take 1 tablet (15 mg total) by mouth daily. 30 tablet 1   methylcellulose (CITRUCEL) oral powder Take as directed, daily     metoCLOPramide (REGLAN) 5 MG tablet Take 1 tablet (5 mg total) by mouth 3 (three) times daily as needed for nausea. Please schedule a follow up appointment for further refills. Thank you 90 tablet 1   Multiple Vitamin (MULTI VITAMIN DAILY PO) Take by mouth.     ondansetron (ZOFRAN-ODT) 8 MG disintegrating tablet TAKE 1 TABLET BY MOUTH EVERY 8 HOURS AS NEEDED FOR NAUSEA OR VOMITING. 30 tablet 0   oxyCODONE-acetaminophen (PERCOCET/ROXICET) 5-325 MG tablet Take 1 tablet by mouth every 6 (six) hours as needed for severe pain.  10 tablet 0   pantoprazole (PROTONIX) 40 MG tablet Take 1 tablet (40 mg total) by mouth 2 (two) times daily before a meal. 60 tablet 3   Plecanatide (TRULANCE) 3 MG TABS Take 3 mg by mouth daily. 30 tablet 3   potassium chloride SA (KLOR-CON  M) 20 MEQ tablet Take 1 tablet by mouth once daily 30 tablet 0   Rimegepant Sulfate 75 MG TBDP Take 1 tab at onset of migraine. May repeat in 2 hrs, if needed. Max dose: 2 tabs/day or 15/month. This is a 90 day rx. 45 tablet 3   rosuvastatin (CRESTOR) 5 MG tablet TAKE 1 TABLET (5 MG TOTAL) BY MOUTH AT BEDTIME. 30 tablet 0   Semaglutide,0.25 or 0.5MG /DOS, (OZEMPIC, 0.25 OR 0.5 MG/DOSE,) 2 MG/1.5ML SOPN Inject 0.5 mg into the skin once a week. 3 mL 0   SPRINTEC 28 0.25-35 MG-MCG tablet Take 1 tablet by mouth once daily 28 tablet 0   sucralfate (CARAFATE) 1 g tablet Take 1 tablet (1 g total) by mouth every 6 (six) hours as needed. Slowly dissolve 1 tablet in 1 tablespoon of distilled water prior to ingestion. 60 tablet 2   VENTOLIN HFA 108 (90 Base) MCG/ACT inhaler INHALE 2 PUFFS BY MOUTH EVERY 6 HOURS AS NEEDED FOR WHEEZING FOR SHORTNESS OF BREATH 18 g 0   dicyclomine (BENTYL) 10 MG/5ML solution Take 5 mLs (10 mg total) by mouth 4 (four) times daily -  before meals and at bedtime. 600 mL 0   fluticasone (FLONASE) 50 MCG/ACT nasal spray PLACE 2 SPRAYS INTO BOTH NOSTRILS DAILY. 16 g 11   No current facility-administered medications for this visit.    REVIEW OF SYSTEMS:  [X]  denotes positive finding, [ ]  denotes negative finding Cardiac  Comments:  Chest pain or chest pressure:    Shortness of breath upon exertion:    Short of breath when lying flat:    Irregular heart rhythm:        Vascular    Pain in calf, thigh, or hip brought on by ambulation:    Pain in feet at night that wakes you up from your sleep:     Blood clot in your veins:    Leg swelling:         Pulmonary    Oxygen at home:    Productive cough:     Wheezing:         Neurologic     Sudden weakness in arms or legs:     Sudden numbness in arms or legs:     Sudden onset of difficulty speaking or slurred speech:    Temporary loss of vision in one eye:     Problems with dizziness:         Gastrointestinal    Blood in stool:     Vomited blood:         Genitourinary    Burning when urinating:     Blood in urine:        Psychiatric    Major depression:         Hematologic    Bleeding problems:    Problems with blood clotting too easily:        Skin    Rashes or ulcers:        Constitutional    Fever or chills:      PHYSICAL EXAM: Vitals:   06/29/22 1006  BP: 95/69  Pulse: 90  Resp: 14  Temp: 98.4 F (36.9 C)  TempSrc: Temporal  SpO2: 98%  Weight: 131 lb (59.4 kg)  Height: 4\' 10"  (1.473 m)    GENERAL: The patient is a well-nourished female, in no acute distress. The vital signs are documented above. CARDIAC: There is a regular rate and rhythm.  VASCULAR:  Bilateral femoral pulses  palpable Bilateral PT pulses palpable No distal ulcerations or tissue loss PULMONARY: No respiratory distress. ABDOMEN: Soft and non-tender. MUSCULOSKELETAL: There are no major deformities or cyanosis. NEUROLOGIC: No focal weakness or paresthesias are detected. PSYCHIATRIC: The patient has a normal affect.  DATA:   ABIs on 06/21/2022 that were 1.03 on the right triphasic and 1.05 on the left triphasic.  She has toe pressure of 0 bilaterally.   Assessment/Plan:  47 year old female presents for evaluation of abnormal ABIs as a referral from podiatry.  She describes her pain as being on the balls of both feet along the plantar surface.  I discussed that she has palpable posterior tibial pulses with normal ABIs at the ankle.  I do not think the pain on the plantar surface of her foot is due to ischemia or arterial insufficiency.  She does have flat toe tracings that could be due to small vessel disease and potentially related to her diabetes.  That being said she has no  distal ulcerations or tissue loss at this time to warrant intervention.  I do not think she warrants any lower extremity intervention at this time.  I have asked that she start an 81 mg aspirin for risk reduction and to keep her diabetes well-controlled.  I will bring her back in 1 year in the [facility] for ongoing surveillance with repeat ABI.   Marty Heck, MD [facility] Office: [phone]

## 2022-06-30 ENCOUNTER — Other Ambulatory Visit: Payer: Self-pay

## 2022-06-30 DIAGNOSIS — J3089 Other allergic rhinitis: Secondary | ICD-10-CM

## 2022-06-30 MED ORDER — FLUTICASONE PROPIONATE 50 MCG/ACT NA SUSP: 2.0000 | Freq: Every day | NASAL | 1 refills | Status: DC

## 2022-07-01 NOTE — Therapy (Signed)
OUTPATIENT PHYSICAL THERAPY THORACOLUMBAR TREATMENT   Patient Name: Betty Cain MRN: [id number] DOB:[birth year], 47 y.o., female Today's Date: 07/02/2022  END OF SESSION:  PT End of Session - 07/02/22 1016     Visit Number 9    Date for PT Re-Evaluation 07/20/22    Authorization Type [employer] healthy blue    Authorization Time Period 2/24-4/23/24    Authorization - Visit Number 2    Authorization - Number of Visits 5    PT Start Time 1016    PT Stop Time 1056    PT Time Calculation (min) 40 min    Activity Tolerance Patient tolerated treatment well    Behavior During Therapy [facility] for tasks assessed/performed                    Past Medical History:  Diagnosis Date   Allergy    Anemia    Anxiety 01/2019   Asthma    B12 deficiency    Back pain    Common migraine with intractable migraine 06/28/2017   Constipation    Diabetes ([place]) 02/2019   Fatigue    GERD (gastroesophageal reflux disease)    pos H pylori   Hypertension    controlled with medication   IBS (irritable bowel syndrome) 2005   Joint pain    Neonatal death    Vaginal delivery, full term-lived x2 hours.    Palpitations    Shortness of breath    Shortness of breath on exertion    Spinal headache    Swelling of both lower extremities    Valvular heart disease    Vitamin D deficiency 02/2019   Past Surgical History:  Procedure Laterality Date   ADENOIDECTOMY     and tonsils (as a child)   BIOPSY  08/27/2021   Procedure: BIOPSY;  Surgeon: Yetta Flock, MD;  Location: [facility];  Service: Gastroenterology;;   boil  2003   right elbow   CESAREAN SECTION     x4   CESAREAN SECTION  06/02/2011   Procedure: CESAREAN SECTION;  Surgeon: [name], MD;  Location: [facility];  Service: Gynecology;  Laterality: N/A;  Primary Cesarean Section Delivery Baby Boy @ 0004, Apgars 9/9   CESAREAN SECTION N/A 12/10/2013   Procedure: REPEAT CESAREAN SECTION;  Surgeon: [name], MD;  Location: [facility];   Service: Obstetrics;  Laterality: N/A;   CESAREAN SECTION     colonoscopy  11/23/2018   stated had issues with sleep when in for procedure   ESOPHAGOGASTRODUODENOSCOPY (EGD) WITH PROPOFOL N/A 08/27/2021   Procedure: ESOPHAGOGASTRODUODENOSCOPY (EGD) WITH PROPOFOL;  Surgeon: Yetta Flock, MD;  Location: [facility];  Service: Gastroenterology;  Laterality: N/A;   OTHER SURGICAL HISTORY  2005   Uterine surgery    uterine cauterization     Patient Active Problem List   Diagnosis Date Noted   Small vessel disease ([place]) 06/29/2022   Type 2 diabetes mellitus with obesity ([place]) 03/25/2022   Hypotension - iatrogenic 03/18/2022   Other chronic pain 02/02/2022   Dyspepsia    Early satiety    IDA (iron deficiency anemia) 01/29/2021   Paroxysmal supraventricular tachycardia 10/01/2020   Class 1 obesity with serious comorbidity and body mass index (BMI) of 31.0 to 31.9 in adult 10/01/2020   Diabetes mellitus ([place]) 02/14/2020   Mixed diabetic hyperlipidemia associated with type 2 diabetes mellitus ([place]) 01/10/2020   Hypertension associated with diabetes ([place]) 01/10/2020   Diabetic neuropathy ([place]) 01/10/2020   Vitamin D  deficiency 01/10/2020   Diabetes mellitus with proteinuria ([place]) [id number]   History of Helicobacter pylori infection 08/08/2019   Proteinuria 06/14/2019   Elevated sed rate 05/23/2019   Elevated C-reactive protein (CRP) 05/23/2019   Diabetes mellitus without complication ([place]) [id number]   Dyslipidemia (high LDL; low HDL) 05/23/2019   Type 2 diabetes mellitus with hyperglycemia, without long-term current use of insulin ([place]) 02/17/2019   Hemoglobin A1C between 7% and 9% indicating borderline diabetic control ([place]) 02/17/2019   Breast cancer screening by mammogram 02/17/2019   Frequent headaches 02/14/2019   Anxiety 02/14/2019   Common migraine with intractable migraine 06/28/2017   Chest pain at rest 10/17/2016   Sinus tachycardia 08/18/2016   Prolonged Q-T  interval on ECG 08/18/2016   Vertigo 07/22/2015   Abdominal discomfort 02/14/2015   Low back pain 02/14/2015   Abdominal pain 02/01/2015   Nausea without vomiting 02/01/2015   Environmental allergies 02/01/2015   Language barrier to communication 02/01/2015   Anemia 01/23/2015   Constipation 01/21/2015   Knee pain, bilateral 01/21/2015   Palpitations 09/18/2013   Shortness of breath 09/18/2013   Advanced maternal age in pregnancy in second trimester 06/19/2013   IBS (irritable bowel syndrome) 04/13/2003    PCP: Fenton Foy, NP  REFERRING PROVIDER: Fenton Foy, NP  REFERRING DIAG:  [phone] (ICD-10-CM) - Chronic right shoulder pain  M54.9,G89.29 (ICD-10-CM) - Chronic mid back pain  R42 (ICD-10-CM) - Vertigo    Rationale for Evaluation and Treatment: Rehabilitation  THERAPY DIAG:  Chronic right shoulder pain  Dizziness and giddiness  Muscle weakness (generalized)  ONSET DATE: 04/09/22  SUBJECTIVE:                                                                                                                                                                                          In-person interpreter  SUBJECTIVE STATEMENT: "I'm feeling heaviness in my hand. I can't lift it today. It is painful." She feels tired with the exercises and can't complete them all because of that.  PERTINENT HISTORY:  Patient presents today for a follow-up visit.  Patient does complain of right ear pain with some dizziness x 1 month.  Upon exam ears appear normal.  Patient does also complain of ongoing right shoulder pain and mid back pain.  This has been a chronic issue.  We will get x-rays today.  Will refer patient for physical therapy. Denies f/c/s, n/v/d, hemoptysis, PND, leg swelling  PAIN:  Are you having pain? Yes: NPRS scale: 8/10 Pain location: Rt upper trap to palmar aspect of the hand Pain description: "heavy"  Aggravating factors: housework Relieving factors:  none  PRECAUTIONS: None  WEIGHT BEARING RESTRICTIONS: No  FALLS:  Has patient fallen in last 6 months? No  LIVING ENVIRONMENT: Lives with: lives with their family Lives in: House/apartment Stairs: No Has following equipment at home: None  OCCUPATION: N/A  PLOF: Independent  PATIENT GOALS: Feel better   OBJECTIVE:   DIAGNOSTIC FINDINGS:  EXAM: RIGHT SHOULDER - 3 VIEWFINDINGS: There is no evidence of fracture or dislocation. There is no evidence of arthropathy or other focal bone abnormality. Soft tissues are unremarkable. THORACIC SPINE 3 VIEWS FINDINGS: There is no evidence of thoracic spine fracture. Alignment is normal. No other significant bone abnormalities are identified.   SCREENING FOR RED FLAGS: Bowel or bladder incontinence: No Spinal tumors: No Cauda equina syndrome: No Compression fracture: No Abdominal aneurysm: No  COGNITION: Overall cognitive status: Within functional limits for tasks assessed     SENSATION: Patient reports tingling and numbness in her toes, B.  MUSCLE LENGTH: Hamstrings: Right 75 deg; Left 65 deg   POSTURE: rounded shoulders, forward head, and flexed trunk   PALPATION: Tight and TTP on B cerv paraspinals, B up traps, R medial parascapular muscles, Ant cervical muscles, R > L, B pects, lower T spine and upper lumbar paraspinals  UE ROM: R shoulder P flex to 105, abd to 85 before it becomes painful  UPPER EXTREMITY ROM:  Active ROM Right 06/24/22 Left eval  Shoulder flexion Full pn    Shoulder extension    Shoulder abduction Full pn   Shoulder adduction    Shoulder extension    Shoulder internal rotation L5 pn   Shoulder external rotation T3 pn   Elbow flexion    Elbow extension    Wrist flexion    Wrist extension    Wrist ulnar deviation    Wrist radial deviation    Wrist pronation    Wrist supination     (Blank rows = not tested)  UE STRENGTH: R shoulder pain limits MMT  UE SPECIAL TESTS: R Hawkins,  Neer, empty can test all (+), belly press test (-)  CERVICAL ROM: Limited by pain in all directions, mostly ext and B lateral flex   Active ROM A/PROM 06/24/22  Flexion WNL  Extension WNL increased RUE pain  Right lateral flexion WNL  Left lateral flexion WNL  Right rotation WNL pain Rt upper trap  Left rotation WNL pain Rt upper trap    (Blank rows = not tested)  06/24/22:   THORACIC MOBILITY: Limited due to pain.  LOWER EXTREMITY ROM: Mild tightness noted in R knee to chest  LOWER EXTREMITY STRENGTH: B hips and knee 3+/5  FUNCTIONAL ASSESSMENT: Quick DASH 71%  VESTIBULAR ASSESSMENT: TBD  GAIT: Distance walked: In clinic distances Assistive device utilized: None Level of assistance: Complete Independence Comments: [facility]  [facility] Adult PT Treatment:                                                DATE: 07/02/22 Therapeutic Exercise: Supine cervical retraction x 10  Seated cervical retraction with overpressure 2 x 10  Supine horizontal shoulder abduction 2 x 8; yellow band Putty gripping x 10  Seated bicep curls 2 x 10; yellow band  Seated tricep extension 2 x 10; yellow band  Seated shoulder ER 2 x 10; yellow band  Updated HEP    [facility] Adult PT Treatment:  DATE: 06/24/22 Therapeutic Exercise: Seated cervical retraction x  10  Supine cervical retraction 2 x 10  Seated scapular retraction 2 x 10  Pec doorway stretch 2 x 30 sec  Updated HEP  Manual Therapy: Gentle cervical distraction STM/trigger point release Rt upper trap/levator scapulae    06/04/22 UBE L1 x81mins  Seated rows and lat pull downs 10# 2x10  QuickDash- 61.4% Brandt-Daroff exercises x3 Horizontal abd red 2x10 Ball roll up wall x10   05/28/22 Horizontal canal testing- positive for dizziness on R side  BBQ roll  VOR x1 standing 80 bps, then 100bps VOR x2 standing 80 bps Shoulder flexion 2# x10 Shoulder abd 2# x10  Rows and ext red 2x10 MH to R shoulder  and upper trap 10 mins   05/19/22 L Epley- eyelids fluttered, and dizziness with each position STM to R supraspinatus, long head of biceps, pects, F/B stretch and humeral mobs in inf and post glide Shoulder retraction/depression in sitting with hands behind hips x 10 Scap protraction against G tband x 10, wall slides into flex x 10-painful VOR x 1 no dizziness hor or vert VOR x 2 dizziness reported   05/14/22 L Epley- negative but dizziness when sitting back up, goes away <20s  Saccades looking back and forth 2 targets- dizziness with vertical movements after stopping VOR x1 horizontal and vertical 20 reps- a little dizzy after looking vertically VOR x2 dizzy after 6 reps horizontal but second rep able to do 20 VOR x2 dizzy after 10 vertical reps, then does 20 but dizzy looking up STM with gun to bilateral UT  Manual cervical traction and STM to suboccipitals  Supine shoulder flexion 2# 2x10 Chest press 2# 2x10 PROM to R shoulder all directions and lateral distraction   PATIENT EDUCATION:  Education details: HEP Person educated: Patient and interpreter Education method: Explanation, Demonstration, Tactile cues, Verbal cues, and Handouts Education comprehension: verbalized understanding, returned demonstration, verbal cues required, tactile cues required, and needs further education  HOME EXERCISE PROGRAM: Access Code: [email or web address] URL: [email or web address] Date: 06/24/2022 Prepared by: Gwendolyn Grant  Exercises - Seated Scapular Retraction  - 2 x daily - 7 x weekly - 2 reps - 10 hold - Supine Cervical Retraction with Towel  - 2 x daily - 7 x weekly - 2 sets - 10 reps - Doorway Pec Stretch at 60 Elevation  - 1 x daily - 7 x weekly - 3 reps - 30 sec  hold - Seated Gaze Stabilization with Head Rotation and Horizontal Arm Movement  - 1 x daily - 7 x weekly - 2 sets - 20 reps - Seated Gaze Stabilization with Head Nod and Vertical Arm Movement  - 1 x daily - 7 x weekly - 2  sets - 20 reps  ASSESSMENT:  CLINICAL IMPRESSION: Patient arrives with high pain levels about the RUE, but in NAD. She responds positively to repeated cervical retraction reporting abolishment of her RUE pain. Patient was educated on importance of completing this movement frequently throughout the day to assist in overall pain reduction with patient verbalizing understanding. Her RUE pain returned while completing there ex, but was again reduced utilizing repeated cervical movement.   OBJECTIVE IMPAIRMENTS: decreased activity tolerance, decreased coordination, decreased mobility, decreased ROM, decreased strength, increased muscle spasms, impaired flexibility, impaired UE functional use, improper body mechanics, postural dysfunction, and pain.   ACTIVITY LIMITATIONS: carrying, lifting, bending, sitting, squatting, sleeping, bathing, dressing, reach over head, hygiene/grooming, and locomotion level  PARTICIPATION LIMITATIONS: meal prep, cleaning, laundry,  and shopping  PERSONAL FACTORS: Past/current experiences are also affecting patient's functional outcome.   REHAB POTENTIAL: Good  CLINICAL DECISION MAKING: Evolving/moderate complexity  EVALUATION COMPLEXITY: Moderate   GOALS: Goals reviewed with patient? Yes  SHORT TERM GOALS: Target date: 04/27/22  I with initial HEP Baseline: Goal status: ongoing  LONG TERM GOALS: Target date: 07/20/22  I with final HEP Baseline:  Goal status: 05/19/22-ongoing  2.  Patient will be able to perform her normal daily activities including housekeeping, cooking, self car, with R shoulder pain < 4/10 Baseline:  Goal status: 05/19/22- ongoing  3.  Increase R shoulder strength to at least 4/5 Baseline:  Goal status: IN PROGRESS  4.  Increase BLE strength to at least 4/5 Baseline:  Goal status: ongoing  5.  Patient will report improvement in shoulder and back pain by at least 50% Baseline:  Goal status: INITIAL  6.  Patient will report no  dizziness while moving about throughout the day Baseline:  Goal status: INITIAL  PLAN:  PT FREQUENCY: 2x/week  PT DURATION: 12 weeks  PLANNED INTERVENTIONS: Therapeutic exercises, Therapeutic activity, Neuromuscular re-education, Balance training, Gait training, Patient/Family education, Self Care, Joint mobilization, Vestibular training, Canalith repositioning, Dry Needling, Electrical stimulation, Spinal mobilization, Cryotherapy, Moist heat, Taping, Vasopneumatic device, Traction, Ionotophoresis 4mg /ml Dexamethasone, and Manual therapy.  PLAN FOR NEXT SESSION:  vestibular re-ed with VOR, re-assess L horizontal canal. Review and progress HEP PRN; postural strengthening, consider nerve glides, repeated cervical movement.   Gwendolyn Grant, PT, DPT, ATC 07/02/22 10:56 AM

## 2022-07-02 ENCOUNTER — Ambulatory Visit: Payer: Medicaid Other

## 2022-07-02 DIAGNOSIS — G8929 Other chronic pain: Secondary | ICD-10-CM

## 2022-07-02 DIAGNOSIS — M6281 Muscle weakness (generalized): Secondary | ICD-10-CM

## 2022-07-02 DIAGNOSIS — R42 Dizziness and giddiness: Secondary | ICD-10-CM

## 2022-07-02 DIAGNOSIS — M25511 Pain in right shoulder: Secondary | ICD-10-CM | POA: Diagnosis not present

## 2022-07-05 ENCOUNTER — Ambulatory Visit: Payer: Medicaid Other | Admitting: Adult Health

## 2022-07-05 ENCOUNTER — Encounter: Payer: Self-pay | Admitting: Physical Therapy

## 2022-07-05 ENCOUNTER — Encounter: Payer: Self-pay | Admitting: Adult Health

## 2022-07-05 VITALS — BP 101/68 | HR 82 | Ht <= 58 in | Wt 131.2 lb

## 2022-07-05 DIAGNOSIS — G43709 Chronic migraine without aura, not intractable, without status migrainosus: Secondary | ICD-10-CM | POA: Diagnosis not present

## 2022-07-05 NOTE — Progress Notes (Signed)
PATIENT: Betty Cain DOB: Aug 02, 1975  REASON FOR VISIT: follow up HISTORY FROM: patient PRIMARY NEUROLOGIST: Dr. Jannifer Franklin  Chief Complaint  Patient presents with   Follow-up    Pt in 18 with interpreter  Pt states 1 migraine in last month Pt states at times 2 migraines in a month Pt states that migraine pain starts in forehead and travels to back of head Pt states some neck pain Pt states having migraine today Pain level 9  Pt states taking PT for neck,shoulder and back pain  Pt states some dizziness with migraines     HISTORY OF PRESENT ILLNESS: Today 07/05/22:  Betty Cain is a 47 y.o. female with a history of Migraine headaches. Returns today for follow-up.  1-3 times a month. Continues to take nurtec and it does help. Takes about 1-2 hours and her headache will resolve.  She states that in the last 3 months her migraines have started suddenly associated with some dizziness.  However they still respond well to Nurtec.  The frequency of her headaches have not changed.  This month she is fasting for Ramadan.  She returns today for an evaluation.   06/29/21: Betty Cain is a 47 year old female with a history of migraine headaches.  She returns today for follow-up.  Continues Ajovy and reports that it works well. Takes nurtec when she gets a headache. Reports headache will resolve after rest in addition to nurtec. Not sure of her headache frequency but knows that it's better with medication.   Patient reports that she has generalized pain all over.  Reviewing her PCP note from February it looks as a diagnosis of fibromyalgia was being considered.  The patient reports that she was told to follow-up with neurology.   Betty Cain is a 47 year old female with a history of migraine headaches.  She is currently on Ajovy monthly injections.  She also takes Nurtec for abortive therapy.  At the last visit tizanidine was added on for abortive therapy.  Patient reports that her headaches have worsened.   She states that she has a daily headache that is typically mild.  She states 3 days out of the week it is severe.  Patient reports that she tried tizanidine but she does not remember how helpful it was.  She reports that Nurtec offers her temporary relief.  HISTORY (copied from [place]): 06/19/20 Betty Cain is a 47 year old female with history of daily headaches.  Has tried Aimovig, is now on Ajovy since January. Has DM, A1C was 5.7.  Currently taking gabapentin, metoprolol, nortriptyline.  Has an allergy to Topamax. Likes Ajovy, helping more than Aimovig, migraines are improved, only 1 a month, but is more intense. It may last 5 days. Is able to continue with function, has 4 kids. Takes Nurtec, is helpful. May have nausea, but no vomiting. Is happy with how things have improved. With headaches, can have aches all over, tightness in her neck. Here with interpreter.   REVIEW OF SYSTEMS: Out of a complete 14 system review of symptoms, the patient complains only of the following symptoms, and all other reviewed systems are negative.  See HPI  ALLERGIES: Allergies  Allergen Reactions   Topamax [Topiramate] Itching and Rash    HOME MEDICATIONS: Outpatient Medications Prior to Visit  Medication Sig Dispense Refill   Accu-Chek Softclix Lancets lancets USE UP TO FOUR TIMES DAILY AS DIRECTED 100 each 0   acetaminophen (TYLENOL) 500 MG tablet Take 500 mg by mouth every 6 (six) hours  as needed.     bisoprolol (ZEBETA) 5 MG tablet Take 1 tablet (5 mg total) by mouth 2 (two) times daily. 180 tablet 3   blood glucose meter kit and supplies KIT Dispense based on patient and insurance preference. Use up to four times daily as directed. 1 each 0   Blood Pressure Monitoring DEVI 1 each by Does not apply route daily. 1 Device 0   cloNIDine (CATAPRES) 0.1 MG tablet Take 1 tablet (0.1 mg total) by mouth 2 (two) times daily. 60 tablet 11   ferrous sulfate (FEROSUL) 325 (65 FE) MG tablet TAKE 1 TABLET (325 MG  TOTAL) BY MOUTH DAILY. 90 tablet 3   fexofenadine (ALLEGRA ALLERGY) 180 MG tablet Take 1 tablet (180 mg total) by mouth daily. 90 tablet 0   fluticasone (FLONASE) 50 MCG/ACT nasal spray PLACE 2 SPRAYS INTO BOTH NOSTRILS DAILY. 48 g 1   Fremanezumab-vfrm (AJOVY) 225 MG/1.5ML SOAJ Inject 225mg / 1.53ml in skin every 30 days 1.5 mL 0   gabapentin (NEURONTIN) 100 MG capsule Take 1 capsule (100 mg total) by mouth 3 (three) times daily. 90 capsule 3   glucose blood (ACCU-CHEK GUIDE) test strip Use as instructed 100 each 12   ivabradine (CORLANOR) 5 MG TABS tablet Take 3 tablets by mouth two hours prior to cardiac CT scan. 3 tablet 0   meloxicam (MOBIC) 15 MG tablet Take 1 tablet (15 mg total) by mouth daily. 30 tablet 1   methylcellulose (CITRUCEL) oral powder Take as directed, daily     metoCLOPramide (REGLAN) 5 MG tablet Take 1 tablet (5 mg total) by mouth 3 (three) times daily as needed for nausea. Please schedule a follow up appointment for further refills. Thank you 90 tablet 1   Multiple Vitamin (MULTI VITAMIN DAILY PO) Take by mouth.     ondansetron (ZOFRAN-ODT) 8 MG disintegrating tablet TAKE 1 TABLET BY MOUTH EVERY 8 HOURS AS NEEDED FOR NAUSEA OR VOMITING. 30 tablet 0   oxyCODONE-acetaminophen (PERCOCET/ROXICET) 5-325 MG tablet Take 1 tablet by mouth every 6 (six) hours as needed for severe pain. 10 tablet 0   pantoprazole (PROTONIX) 40 MG tablet Take 1 tablet (40 mg total) by mouth 2 (two) times daily before a meal. 60 tablet 3   Plecanatide (TRULANCE) 3 MG TABS Take 3 mg by mouth daily. 30 tablet 3   potassium chloride SA (KLOR-CON M) 20 MEQ tablet Take 1 tablet by mouth once daily 30 tablet 0   Rimegepant Sulfate 75 MG TBDP Take 1 tab at onset of migraine. May repeat in 2 hrs, if needed. Max dose: 2 tabs/day or 15/month. This is a 90 day rx. 45 tablet 3   rosuvastatin (CRESTOR) 5 MG tablet TAKE 1 TABLET (5 MG TOTAL) BY MOUTH AT BEDTIME. 30 tablet 0   Semaglutide,0.25 or 0.5MG /DOS, (OZEMPIC,  0.25 OR 0.5 MG/DOSE,) 2 MG/1.5ML SOPN Inject 0.5 mg into the skin once a week. 3 mL 0   SPRINTEC 28 0.25-35 MG-MCG tablet Take 1 tablet by mouth once daily 28 tablet 0   sucralfate (CARAFATE) 1 g tablet Take 1 tablet (1 g total) by mouth every 6 (six) hours as needed. Slowly dissolve 1 tablet in 1 tablespoon of distilled water prior to ingestion. 60 tablet 2   VENTOLIN HFA 108 (90 Base) MCG/ACT inhaler INHALE 2 PUFFS BY MOUTH EVERY 6 HOURS AS NEEDED FOR WHEEZING FOR SHORTNESS OF BREATH 18 g 0   dicyclomine (BENTYL) 10 MG/5ML solution Take 5 mLs (10 mg total) by mouth  4 (four) times daily -  before meals and at bedtime. 600 mL 0   No facility-administered medications prior to visit.    PAST MEDICAL HISTORY: Past Medical History:  Diagnosis Date   Allergy    Anemia    Anxiety 01/2019   Asthma    B12 deficiency    Back pain    Common migraine with intractable migraine 06/28/2017   Constipation    Diabetes ([place]) 02/2019   Fatigue    GERD (gastroesophageal reflux disease)    pos H pylori   Hypertension    controlled with medication   IBS (irritable bowel syndrome) 2005   Joint pain    Neonatal death    Vaginal delivery, full term-lived x2 hours.    Palpitations    Shortness of breath    Shortness of breath on exertion    Spinal headache    Swelling of both lower extremities    Valvular heart disease    Vitamin D deficiency 02/2019    PAST SURGICAL HISTORY: Past Surgical History:  Procedure Laterality Date   ADENOIDECTOMY     and tonsils (as a child)   BIOPSY  08/27/2021   Procedure: BIOPSY;  Surgeon: Yetta Flock, MD;  Location: [facility];  Service: Gastroenterology;;   boil  2003   right elbow   CESAREAN SECTION     x4   CESAREAN SECTION  06/02/2011   Procedure: CESAREAN SECTION;  Surgeon: [name], MD;  Location: [facility];  Service: Gynecology;  Laterality: N/A;  Primary Cesarean Section Delivery Baby Boy @ 0004, Apgars 9/9   CESAREAN SECTION N/A  12/10/2013   Procedure: REPEAT CESAREAN SECTION;  Surgeon: [name], MD;  Location: [facility];  Service: Obstetrics;  Laterality: N/A;   CESAREAN SECTION     colonoscopy  11/23/2018   stated had issues with sleep when in for procedure   ESOPHAGOGASTRODUODENOSCOPY (EGD) WITH PROPOFOL N/A 08/27/2021   Procedure: ESOPHAGOGASTRODUODENOSCOPY (EGD) WITH PROPOFOL;  Surgeon: Yetta Flock, MD;  Location: [facility];  Service: Gastroenterology;  Laterality: N/A;   OTHER SURGICAL HISTORY  2005   Uterine surgery    uterine cauterization      FAMILY HISTORY: Family History  Problem Relation Age of Onset   Diabetes Mother    Diabetes Father    Diabetes Sister    Migraines Sister    Migraines Daughter    Anesthesia problems Neg Hx    Colon cancer Neg Hx    Esophageal cancer Neg Hx    Rectal cancer Neg Hx    Stomach cancer Neg Hx    Colon polyps Neg Hx     SOCIAL HISTORY: Social History   Socioeconomic History   Marital status: Married    Spouse name: Armed forces technical officer   Number of children: 4   Years of education: Not on file   Highest education level: Not on file  Occupational History   Occupation: Unemployed  Tobacco Use   Smoking status: Never   Smokeless tobacco: Never  Vaping Use   Vaping Use: Never used  Substance and Sexual Activity   Alcohol use: No   Drug use: No   Sexual activity: Yes    Birth control/protection: None    Comment: pregnant  Other Topics Concern   Not on file  Social History Narrative   Lives with husband and child   Caffeine use: daily (tea), sometimes coffee/soda   Right handed    Social Determinants of Radio broadcast assistant  Strain: Not on file  Food Insecurity: No Food Insecurity (03/12/2020)   Hunger Vital Sign    Worried About Running Out of Food in the Last Year: Never true    Ran Out of Food in the Last Year: Never true  Transportation Needs: Not on file  Physical Activity: Not on file  Stress: Not on file  Social  Connections: Not on file  Intimate Partner Violence: Not on file      PHYSICAL EXAM  Vitals:   07/05/22 0959  BP: 101/68  Pulse: 82  Weight: 131 lb 3.2 oz (59.5 kg)  Height: 4\' 10"  (1.473 m)   Body mass index is 27.42 kg/m.  Generalized: Well developed, in no acute distress   Neurological examination  Mentation: Alert oriented to time, place, history taking. Follows all commands speech and language fluent Cranial nerve II-XII: Pupils were equal round reactive to light. Extraocular movements were full, visual field were full on confrontational test. Facial sensation and strength were normal.  Head turning and shoulder shrug  were normal and symmetric. Motor: The motor testing reveals 5 over 5 strength of all 4 extremities. Good symmetric motor tone is noted throughout.  Sensory: Sensory testing is intact to soft touch on all 4 extremities. No evidence of extinction is noted.  Coordination: Cerebellar testing reveals good finger-nose-finger and heel-to-shin bilaterally.  Gait and station: Gait is normal.    DIAGNOSTIC DATA (LABS, IMAGING, TESTING) - I reviewed patient records, labs, notes, testing and imaging myself where available.  Lab Results  Component Value Date   WBC 8.7 04/09/2022   HGB 12.5 04/09/2022   HCT 37.7 04/09/2022   MCV 91 04/09/2022   PLT 287 04/09/2022      Component Value Date/Time   NA 142 06/21/2022 1150   K 4.3 06/21/2022 1150   CL 103 06/21/2022 1150   CO2 21 06/21/2022 1150   GLUCOSE 69 (L) 06/21/2022 1150   GLUCOSE 185 (H) 08/30/2018 1240   GLUCOSE 77 09/21/2013 1230   BUN 18 06/21/2022 1150   CREATININE 0.61 06/21/2022 1150   CREATININE 0.58 09/14/2016 0939   CALCIUM 10.0 06/21/2022 1150   PROT 7.9 06/21/2022 1150   ALBUMIN 5.2 (H) 06/21/2022 1150   AST 16 06/21/2022 1150   ALT 15 06/21/2022 1150   ALKPHOS 85 06/21/2022 1150   BILITOT 0.4 06/21/2022 1150   GFRNONAA 109 01/02/2020 1027   GFRNONAA >89 09/14/2016 0939   GFRAA 126  01/02/2020 1027   GFRAA >89 09/14/2016 0939   Lab Results  Component Value Date   CHOL 117 04/09/2022   HDL 44 04/09/2022   LDLCALC 58 04/09/2022   TRIG 75 04/09/2022   CHOLHDL 2.7 04/09/2022   Lab Results  Component Value Date   HGBA1C 5.2 06/21/2022   Lab Results  Component Value Date   [id number] 01/01/2021   Lab Results  Component Value Date   TSH 2.530 12/28/2021      ASSESSMENT AND PLAN 47 y.o. year old female  has a past medical history of Allergy, Anemia, Anxiety (01/2019), Asthma, B12 deficiency, Back pain, Common migraine with intractable migraine (06/28/2017), Constipation, Diabetes ([place]) (02/2019), Fatigue, GERD (gastroesophageal reflux disease), Hypertension, IBS (irritable bowel syndrome) 08-05-2003), Joint pain, Neonatal death, Palpitations, Shortness of breath, Shortness of breath on exertion, Spinal headache, Swelling of both lower extremities, Valvular heart disease, and Vitamin D deficiency (02/2019). here with:  1.  Migraine headaches  -Patient will continue on Ajovy monthly injections for now. -Continue Nurtec for abortive  therapy -Did advise that during this month of fasting she may notice an increase in her migraines. -Advised if symptoms worsen or she develops new symptoms she should let us know -Follow-up in 6-7 months or sooner if needed       Ward Givens, MSN, NP-C 07/05/2022, 10:12 AM [facility] [address], [place], [place] [address] [phone]

## 2022-07-06 ENCOUNTER — Ambulatory Visit: Payer: Medicaid Other

## 2022-07-06 DIAGNOSIS — G8929 Other chronic pain: Secondary | ICD-10-CM | POA: Diagnosis not present

## 2022-07-06 DIAGNOSIS — M6281 Muscle weakness (generalized): Secondary | ICD-10-CM

## 2022-07-06 DIAGNOSIS — M25511 Pain in right shoulder: Secondary | ICD-10-CM | POA: Diagnosis not present

## 2022-07-06 DIAGNOSIS — R42 Dizziness and giddiness: Secondary | ICD-10-CM

## 2022-07-06 NOTE — Therapy (Signed)
OUTPATIENT PHYSICAL THERAPY THORACOLUMBAR TREATMENT   Patient Name: Betty Cain MRN: [id number] DOB:[birth year], 47 y.o., female Today's Date: 07/06/2022  END OF SESSION:  PT End of Session - 07/06/22 1010     Visit Number 10    Date for PT Re-Evaluation 07/20/22    Authorization Type MCD healthy blue    Authorization Time Period 2/24-4/23/24    Authorization - Visit Number 3    Authorization - Number of Visits 5    PT Start Time [id number]    PT Stop Time 1055    PT Time Calculation (min) 40 min    Activity Tolerance Patient tolerated treatment well    Behavior During Therapy [facility] for tasks assessed/performed                     Past Medical History:  Diagnosis Date   Allergy    Anemia    Anxiety 01/2019   Asthma    B12 deficiency    Back pain    Common migraine with intractable migraine 06/28/2017   Constipation    Diabetes ([place]) 02/2019   Fatigue    GERD (gastroesophageal reflux disease)    pos H pylori   Hypertension    controlled with medication   IBS (irritable bowel syndrome) 2005   Joint pain    Neonatal death    Vaginal delivery, full term-lived x2 hours.    Palpitations    Shortness of breath    Shortness of breath on exertion    Spinal headache    Swelling of both lower extremities    Valvular heart disease    Vitamin D deficiency 02/2019   Past Surgical History:  Procedure Laterality Date   ADENOIDECTOMY     and tonsils (as a child)   BIOPSY  08/27/2021   Procedure: BIOPSY;  Surgeon: Yetta Flock, MD;  Location: [facility];  Service: Gastroenterology;;   boil  2003   right elbow   CESAREAN SECTION     x4   CESAREAN SECTION  06/02/2011   Procedure: CESAREAN SECTION;  Surgeon: [name], MD;  Location: [facility];  Service: Gynecology;  Laterality: N/A;  Primary Cesarean Section Delivery Baby Boy @ 0004, Apgars 9/9   CESAREAN SECTION N/A 12/10/2013   Procedure: REPEAT CESAREAN SECTION;  Surgeon: [name], MD;  Location: [facility];  Service: Obstetrics;  Laterality: N/A;   CESAREAN SECTION     colonoscopy  11/23/2018   stated had issues with sleep when in for procedure   ESOPHAGOGASTRODUODENOSCOPY (EGD) WITH PROPOFOL N/A 08/27/2021   Procedure: ESOPHAGOGASTRODUODENOSCOPY (EGD) WITH PROPOFOL;  Surgeon: Yetta Flock, MD;  Location: [facility];  Service: Gastroenterology;  Laterality: N/A;   OTHER SURGICAL HISTORY  2005   Uterine surgery    uterine cauterization     Patient Active Problem List   Diagnosis Date Noted   Small vessel disease ([place]) 06/29/2022   Type 2 diabetes mellitus with obesity ([place]) 03/25/2022   Hypotension - iatrogenic 03/18/2022   Other chronic pain 02/02/2022   Dyspepsia    Early satiety    IDA (iron deficiency anemia) 01/29/2021   Paroxysmal supraventricular tachycardia 10/01/2020   Class 1 obesity with serious comorbidity and body mass index (BMI) of 31.0 to 31.9 in adult 10/01/2020   Diabetes mellitus ([place]) 02/14/2020   Mixed diabetic hyperlipidemia associated with type 2 diabetes mellitus ([place]) 01/10/2020   Hypertension associated with diabetes ([place]) 01/10/2020   Diabetic neuropathy ([place]) 01/10/2020   Vitamin  D deficiency 01/10/2020   Diabetes mellitus with proteinuria ([place]) [id number]   History of Helicobacter pylori infection 08/08/2019   Proteinuria 06/14/2019   Elevated sed rate 05/23/2019   Elevated C-reactive protein (CRP) 05/23/2019   Diabetes mellitus without complication ([place]) [id number]   Dyslipidemia (high LDL; low HDL) 05/23/2019   Type 2 diabetes mellitus with hyperglycemia, without long-term current use of insulin ([place]) 02/17/2019   Hemoglobin A1C between 7% and 9% indicating borderline diabetic control ([place]) 02/17/2019   Breast cancer screening by mammogram 02/17/2019   Frequent headaches 02/14/2019   Anxiety 02/14/2019   Common migraine with intractable migraine 06/28/2017   Chest pain at rest 10/17/2016   Sinus tachycardia 08/18/2016   Prolonged Q-T  interval on ECG 08/18/2016   Vertigo 07/22/2015   Abdominal discomfort 02/14/2015   Low back pain 02/14/2015   Abdominal pain 02/01/2015   Nausea without vomiting 02/01/2015   Environmental allergies 02/01/2015   Language barrier to communication 02/01/2015   Anemia 01/23/2015   Constipation 01/21/2015   Knee pain, bilateral 01/21/2015   Palpitations 09/18/2013   Shortness of breath 09/18/2013   Advanced maternal age in pregnancy in second trimester 06/19/2013   IBS (irritable bowel syndrome) 04/13/2003    PCP: Fenton Foy, NP  REFERRING PROVIDER: Fenton Foy, NP  REFERRING DIAG:  [phone] (ICD-10-CM) - Chronic right shoulder pain  M54.9,G89.29 (ICD-10-CM) - Chronic mid back pain  R42 (ICD-10-CM) - Vertigo    Rationale for Evaluation and Treatment: Rehabilitation  THERAPY DIAG:  Chronic right shoulder pain  Dizziness and giddiness  Muscle weakness (generalized)  ONSET DATE: 04/09/22  SUBJECTIVE:                                                                                                                                                                                          In-person interpreter  SUBJECTIVE STATEMENT: "I am feeling good. I now have pain in my neck from the exercises, but not down the arm." She feels that her dizziness is better right now.   PERTINENT HISTORY:  Patient presents today for a follow-up visit.  Patient does complain of right ear pain with some dizziness x 1 month.  Upon exam ears appear normal.  Patient does also complain of ongoing right shoulder pain and mid back pain.  This has been a chronic issue.  We will get x-rays today.  Will refer patient for physical therapy. Denies f/c/s, n/v/d, hemoptysis, PND, leg swelling  PAIN:  Are you having pain? Yes: NPRS scale: 7/10 Pain location: neck Pain description: "tense, pulling"  Aggravating factors: housework Relieving factors: repeated movement  PRECAUTIONS:  None  WEIGHT BEARING RESTRICTIONS: No  FALLS:  Has patient fallen in last 6 months? No  LIVING ENVIRONMENT: Lives with: lives with their family Lives in: House/apartment Stairs: No Has following equipment at home: None  OCCUPATION: N/A  PLOF: Independent  PATIENT GOALS: Feel better   OBJECTIVE:   DIAGNOSTIC FINDINGS:  EXAM: RIGHT SHOULDER - 3 VIEWFINDINGS: There is no evidence of fracture or dislocation. There is no evidence of arthropathy or other focal bone abnormality. Soft tissues are unremarkable. THORACIC SPINE 3 VIEWS FINDINGS: There is no evidence of thoracic spine fracture. Alignment is normal. No other significant bone abnormalities are identified.   SCREENING FOR RED FLAGS: Bowel or bladder incontinence: No Spinal tumors: No Cauda equina syndrome: No Compression fracture: No Abdominal aneurysm: No  COGNITION: Overall cognitive status: Within functional limits for tasks assessed     SENSATION: Patient reports tingling and numbness in her toes, B.  MUSCLE LENGTH: Hamstrings: Right 75 deg; Left 65 deg   POSTURE: rounded shoulders, forward head, and flexed trunk   PALPATION: Tight and TTP on B cerv paraspinals, B up traps, R medial parascapular muscles, Ant cervical muscles, R > L, B pects, lower T spine and upper lumbar paraspinals  UE ROM: R shoulder P flex to 105, abd to 85 before it becomes painful  UPPER EXTREMITY ROM:  Active ROM Right 06/24/22 Left eval  Shoulder flexion Full pn    Shoulder extension    Shoulder abduction Full pn   Shoulder adduction    Shoulder extension    Shoulder internal rotation L5 pn   Shoulder external rotation T3 pn   Elbow flexion    Elbow extension    Wrist flexion    Wrist extension    Wrist ulnar deviation    Wrist radial deviation    Wrist pronation    Wrist supination     (Blank rows = not tested)  UE STRENGTH: R shoulder pain limits MMT  UPPER EXTREMITY MMT:  MMT Right 07/06/22 Left    Shoulder flexion    Shoulder extension    Shoulder abduction    Shoulder adduction    Shoulder extension    Shoulder internal rotation    Shoulder external rotation    Middle trapezius    Lower trapezius    Elbow flexion    Elbow extension    Wrist flexion 4   Wrist extension 4   Wrist ulnar deviation    Wrist radial deviation    Wrist pronation    Wrist supination    Grip strength     (Blank rows = not tested)   UE SPECIAL TESTS: R Hawkins, Neer, empty can test all (+), belly press test (-)  CERVICAL ROM: Limited by pain in all directions, mostly ext and B lateral flex   Active ROM A/PROM 06/24/22  Flexion WNL  Extension WNL increased RUE pain  Right lateral flexion WNL  Left lateral flexion WNL  Right rotation WNL pain Rt upper trap  Left rotation WNL pain Rt upper trap    (Blank rows = not tested)  06/24/22:   THORACIC MOBILITY: Limited due to pain.  LOWER EXTREMITY ROM: Mild tightness noted in R knee to chest  LOWER EXTREMITY STRENGTH: B hips and knee 3+/5  FUNCTIONAL ASSESSMENT: Quick DASH 71%  VESTIBULAR ASSESSMENT: TBD  GAIT: Distance walked: In clinic distances Assistive device utilized: None Level of assistance: Complete Independence Comments: [facility] Adult PT Treatment:  DATE: 07/06/22 Therapeutic Exercise: Supine cervical retraction x 10  Standing resisted shoulder extension yellow band 2 x 10  Standing resisted rows 2 x 10; red band  Seated cervical retraction with overpressure x 10  Resisted wrist flexion 2 x 10; 1# Resisted wrist extension 2 x 10; 1#  Bicep curl 2 x 10; 1# Reviewed HEP  Manual Therapy: Manual cervical distraction    [facility] Adult PT Treatment:                                                DATE: 07/02/22 Therapeutic Exercise: Supine cervical retraction x 10  Seated cervical retraction with overpressure 2 x 10  Supine horizontal shoulder abduction 2 x 8; yellow band Putty  gripping x 10  Seated bicep curls 2 x 10; yellow band  Seated tricep extension 2 x 10; yellow band  Seated shoulder ER 2 x 10; yellow band  Updated HEP    [facility] Adult PT Treatment:                                                DATE: 06/24/22 Therapeutic Exercise: Seated cervical retraction x  10  Supine cervical retraction 2 x 10  Seated scapular retraction 2 x 10  Pec doorway stretch 2 x 30 sec  Updated HEP  Manual Therapy: Gentle cervical distraction STM/trigger point release Rt upper trap/levator scapulae    06/04/22 UBE L1 x42mins  Seated rows and lat pull downs 10# 2x10  QuickDash- 61.4% Brandt-Daroff exercises x3 Horizontal abd red 2x10 Ball roll up wall x10   05/28/22 Horizontal canal testing- positive for dizziness on R side  BBQ roll  VOR x1 standing 80 bps, then 100bps VOR x2 standing 80 bps Shoulder flexion 2# x10 Shoulder abd 2# x10  Rows and ext red 2x10 MH to R shoulder and upper trap 10 mins   05/19/22 L Epley- eyelids fluttered, and dizziness with each position STM to R supraspinatus, long head of biceps, pects, F/B stretch and humeral mobs in inf and post glide Shoulder retraction/depression in sitting with hands behind hips x 10 Scap protraction against G tband x 10, wall slides into flex x 10-painful VOR x 1 no dizziness hor or vert VOR x 2 dizziness reported   05/14/22 L Epley- negative but dizziness when sitting back up, goes away <20s  Saccades looking back and forth 2 targets- dizziness with vertical movements after stopping VOR x1 horizontal and vertical 20 reps- a little dizzy after looking vertically VOR x2 dizzy after 6 reps horizontal but second rep able to do 20 VOR x2 dizzy after 10 vertical reps, then does 20 but dizzy looking up STM with gun to bilateral UT  Manual cervical traction and STM to suboccipitals  Supine shoulder flexion 2# 2x10 Chest press 2# 2x10 PROM to R shoulder all directions and lateral distraction   PATIENT  EDUCATION:  Education details: HEP Person educated: Patient and interpreter Education method: Explanation, Demonstration, Tactile cues, and Verbal cues Education comprehension: verbalized understanding, returned demonstration, verbal cues required, and tactile cues required  HOME EXERCISE PROGRAM: Access Code: [email or web address] URL: [email or web address] Date: 06/24/2022 Prepared by: Gwendolyn Grant  Exercises - Seated Scapular Retraction  - 2 x daily -  7 x weekly - 2 reps - 10 hold - Supine Cervical Retraction with Towel  - 2 x daily - 7 x weekly - 2 sets - 10 reps - Doorway Pec Stretch at 60 Elevation  - 1 x daily - 7 x weekly - 3 reps - 30 sec  hold - Seated Gaze Stabilization with Head Rotation and Horizontal Arm Movement  - 1 x daily - 7 x weekly - 2 sets - 20 reps - Seated Gaze Stabilization with Head Nod and Vertical Arm Movement  - 1 x daily - 7 x weekly - 2 sets - 20 reps  ASSESSMENT:  CLINICAL IMPRESSION: Patient arrives without radicular pain, isolating her pain to her neck. She reports being more consistent with her repeated movement, which seems to be helping with centralizing her pain. Able to reduce her neck pain with repeated movement and gentle cervical distraction, but unable to fully resolve in clinic. Continued with postural strengthening and RUE strengthening with good tolerance requiring moderate cueing throughout session to reduce shoulder shrug and allow for slow, controlled movement. Wrist strength was assessed with mild weakness present.   OBJECTIVE IMPAIRMENTS: decreased activity tolerance, decreased coordination, decreased mobility, decreased ROM, decreased strength, increased muscle spasms, impaired flexibility, impaired UE functional use, improper body mechanics, postural dysfunction, and pain.   ACTIVITY LIMITATIONS: carrying, lifting, bending, sitting, squatting, sleeping, bathing, dressing, reach over head, hygiene/grooming, and locomotion  level  PARTICIPATION LIMITATIONS: meal prep, cleaning, laundry, and shopping  PERSONAL FACTORS: Past/current experiences are also affecting patient's functional outcome.   REHAB POTENTIAL: Good  CLINICAL DECISION MAKING: Evolving/moderate complexity  EVALUATION COMPLEXITY: Moderate   GOALS: Goals reviewed with patient? Yes  SHORT TERM GOALS: Target date: 04/27/22  I with initial HEP Baseline: Goal status: met  LONG TERM GOALS: Target date: 07/20/22  I with final HEP Baseline:  Goal status: 05/19/22-ongoing  2.  Patient will be able to perform her normal daily activities including housekeeping, cooking, self car, with R shoulder pain < 4/10 Baseline:  Goal status: 05/19/22- ongoing  3.  Increase R shoulder strength to at least 4/5 Baseline:  Goal status: IN PROGRESS  4.  Increase BLE strength to at least 4/5 Baseline:  Goal status: ongoing  5.  Patient will report improvement in shoulder and back pain by at least 50% Baseline:  Goal status: INITIAL  6.  Patient will report no dizziness while moving about throughout the day Baseline:  Goal status: INITIAL  PLAN:  PT FREQUENCY: 2x/week  PT DURATION: 12 weeks  PLANNED INTERVENTIONS: Therapeutic exercises, Therapeutic activity, Neuromuscular re-education, Balance training, Gait training, Patient/Family education, Self Care, Joint mobilization, Vestibular training, Canalith repositioning, Dry Needling, Electrical stimulation, Spinal mobilization, Cryotherapy, Moist heat, Taping, Vasopneumatic device, Traction, Ionotophoresis 4mg /ml Dexamethasone, and Manual therapy.  PLAN FOR NEXT SESSION:  vestibular re-ed with VOR, re-assess L horizontal canal. Review and progress HEP PRN; postural/RUE strengthening, consider nerve glides, repeated cervical movement.   Gwendolyn Grant, PT, DPT, ATC 07/06/22 10:55 AM

## 2022-07-08 ENCOUNTER — Encounter: Payer: Self-pay | Admitting: Nurse Practitioner

## 2022-07-08 ENCOUNTER — Ambulatory Visit (INDEPENDENT_AMBULATORY_CARE_PROVIDER_SITE_OTHER): Payer: Medicaid Other | Admitting: Nurse Practitioner

## 2022-07-08 ENCOUNTER — Other Ambulatory Visit: Payer: Self-pay | Admitting: *Deleted

## 2022-07-08 VITALS — BP 114/64 | HR 98 | Temp 98.2°F | Wt 131.2 lb

## 2022-07-08 DIAGNOSIS — E1165 Type 2 diabetes mellitus with hyperglycemia: Secondary | ICD-10-CM

## 2022-07-08 DIAGNOSIS — R1011 Right upper quadrant pain: Secondary | ICD-10-CM

## 2022-07-08 LAB — POCT URINALYSIS DIP (CLINITEK)
Bilirubin, UA: NEGATIVE
Glucose, UA: NEGATIVE mg/dL
Ketones, POC UA: NEGATIVE mg/dL
Leukocytes, UA: NEGATIVE
Nitrite, UA: NEGATIVE
POC PROTEIN,UA: NEGATIVE
Spec Grav, UA: 1.025 (ref 1.010–1.025)
Urobilinogen, UA: 0.2 E.U./dL
pH, UA: 6 (ref 5.0–8.0)

## 2022-07-08 MED ORDER — SENNOSIDES-DOCUSATE SODIUM 8.6-50 MG PO TABS: 1.0000 | ORAL_TABLET | Freq: Every day | ORAL | 0 refills | Status: AC

## 2022-07-08 MED ORDER — OMEPRAZOLE 20 MG PO CPDR: 20.0000 mg | DELAYED_RELEASE_CAPSULE | Freq: Every day | ORAL | 3 refills | Status: DC

## 2022-07-08 MED ORDER — AJOVY 225 MG/1.5ML ~~LOC~~ SOAJ: 225.0000 mg | SUBCUTANEOUS | 6 refills | Status: DC

## 2022-07-08 NOTE — Patient Instructions (Signed)
1. Type 2 diabetes mellitus with hyperglycemia, without long-term current use of insulin (HCC)  - Microalbumin/Creatinine Ratio, Urine - Ambulatory referral to Ophthalmology - CBC - Comprehensive metabolic panel  2. Right upper quadrant abdominal pain  - senna-docusate (SENOKOT-S) 8.6-50 MG tablet; Take 1 tablet by mouth daily for 5 days.  Dispense: 5 tablet; Refill: 0 - omeprazole (PRILOSEC) 20 MG capsule; Take 1 capsule (20 mg total) by mouth daily.  Dispense: 30 capsule; Refill: 3 - POCT URINALYSIS DIP (CLINITEK) - US Abdomen Limited RUQ (LIVER/GB) - CBC - Comprehensive metabolic panel  Follow up:  Follow up in 1 month

## 2022-07-08 NOTE — Assessment & Plan Note (Signed)
-   Microalbumin/Creatinine Ratio, Urine - Ambulatory referral to Ophthalmology - CBC - Comprehensive metabolic panel  2. Right upper quadrant abdominal pain  - senna-docusate (SENOKOT-S) 8.6-50 MG tablet; Take 1 tablet by mouth daily for 5 days.  Dispense: 5 tablet; Refill: 0 - omeprazole (PRILOSEC) 20 MG capsule; Take 1 capsule (20 mg total) by mouth daily.  Dispense: 30 capsule; Refill: 3 - POCT URINALYSIS DIP (CLINITEK) - US Abdomen Limited RUQ (LIVER/GB) - CBC - Comprehensive metabolic panel  Follow up:  Follow up in 1 month

## 2022-07-08 NOTE — Progress Notes (Signed)
@Patient  ID: Betty Cain, female    DOB: 08/30/1975, 47 y.o.   MRN: [id number]  Chief Complaint  Patient presents with   Diabetes    Referring provider: Tresa Garter, MD   HPI  Patient presents today for follow up on diabetes.  Patient did have A1c checked on 06/21/2022 with neurology.  Her A1c was at 5.2.    Abdominal Pain: Patient does complain of stomach pain for the past 2 weeks.  Upon exam she is very tender over her right upper quadrant.  We will try to get a gallbladder ultrasound.  We will check labs.  Patient does have issues with constipation.  She states that her last bowel movement was this morning.  She denies any significant vomiting.  Denies f/c/s, n/v/d, hemoptysis, PND, leg swelling Denies chest pain or edema         Allergies  Allergen Reactions   Topamax [Topiramate] Itching and Rash    Immunization History  Administered Date(s) Administered   Influenza,inj,Quad PF,6+ Mos 06/19/2013, 12/02/2015   PFIZER(Purple Top)SARS-COV-2 Vaccination 07/07/2019, 07/28/2019   Pneumococcal Conjugate-13 05/21/2019   Tdap 09/13/2013, 12/11/2013    Past Medical History:  Diagnosis Date   Allergy    Anemia    Anxiety 01/2019   Asthma    B12 deficiency    Back pain    Common migraine with intractable migraine 06/28/2017   Constipation    Diabetes ([place]) 02/2019   Fatigue    GERD (gastroesophageal reflux disease)    pos H pylori   Hypertension    controlled with medication   IBS (irritable bowel syndrome) 2005   Joint pain    Neonatal death    Vaginal delivery, full term-lived x2 hours.    Palpitations    Shortness of breath    Shortness of breath on exertion    Spinal headache    Swelling of both lower extremities    Valvular heart disease    Vitamin D deficiency 02/2019    Tobacco History: Social History   Tobacco Use  Smoking Status Never  Smokeless Tobacco Never   Counseling given: Not Answered   Outpatient Encounter Medications as  of 07/08/2022  Medication Sig   Accu-Chek Softclix Lancets lancets USE UP TO FOUR TIMES DAILY AS DIRECTED   acetaminophen (TYLENOL) 500 MG tablet Take 500 mg by mouth every 6 (six) hours as needed.   bisoprolol (ZEBETA) 5 MG tablet Take 1 tablet (5 mg total) by mouth 2 (two) times daily.   blood glucose meter kit and supplies KIT Dispense based on patient and insurance preference. Use up to four times daily as directed.   Blood Pressure Monitoring DEVI 1 each by Does not apply route daily.   cloNIDine (CATAPRES) 0.1 MG tablet Take 1 tablet (0.1 mg total) by mouth 2 (two) times daily.   fexofenadine (ALLEGRA ALLERGY) 180 MG tablet Take 1 tablet (180 mg total) by mouth daily.   fluticasone (FLONASE) 50 MCG/ACT nasal spray PLACE 2 SPRAYS INTO BOTH NOSTRILS DAILY.   Fremanezumab-vfrm (AJOVY) 225 MG/1.5ML SOAJ Inject 225mg / 1.67ml in skin every 30 days   glucose blood (ACCU-CHEK GUIDE) test strip Use as instructed   meloxicam (MOBIC) 15 MG tablet Take 1 tablet (15 mg total) by mouth daily.   methylcellulose (CITRUCEL) oral powder Take as directed, daily   metoCLOPramide (REGLAN) 5 MG tablet Take 1 tablet (5 mg total) by mouth 3 (three) times daily as needed for nausea. Please schedule a follow up appointment for  further refills. Thank you   Multiple Vitamin (MULTI VITAMIN DAILY PO) Take by mouth.   omeprazole (PRILOSEC) 20 MG capsule Take 1 capsule (20 mg total) by mouth daily.   ondansetron (ZOFRAN-ODT) 8 MG disintegrating tablet TAKE 1 TABLET BY MOUTH EVERY 8 HOURS AS NEEDED FOR NAUSEA OR VOMITING.   pantoprazole (PROTONIX) 40 MG tablet Take 1 tablet (40 mg total) by mouth 2 (two) times daily before a meal.   potassium chloride SA (KLOR-CON M) 20 MEQ tablet Take 1 tablet by mouth once daily   Rimegepant Sulfate 75 MG TBDP Take 1 tab at onset of migraine. May repeat in 2 hrs, if needed. Max dose: 2 tabs/day or 15/month. This is a 90 day rx.   rosuvastatin (CRESTOR) 5 MG tablet TAKE 1 TABLET (5 MG  TOTAL) BY MOUTH AT BEDTIME.   Semaglutide,0.25 or 0.5MG /DOS, (OZEMPIC, 0.25 OR 0.5 MG/DOSE,) 2 MG/1.5ML SOPN Inject 0.5 mg into the skin once a week.   senna-docusate (SENOKOT-S) 8.6-50 MG tablet Take 1 tablet by mouth daily for 5 days.   SPRINTEC 28 0.25-35 MG-MCG tablet Take 1 tablet by mouth once daily   VENTOLIN HFA 108 (90 Base) MCG/ACT inhaler INHALE 2 PUFFS BY MOUTH EVERY 6 HOURS AS NEEDED FOR WHEEZING FOR SHORTNESS OF BREATH   dicyclomine (BENTYL) 10 MG/5ML solution Take 5 mLs (10 mg total) by mouth 4 (four) times daily -  before meals and at bedtime. (Patient not taking: Reported on 07/08/2022)   ferrous sulfate (FEROSUL) 325 (65 FE) MG tablet TAKE 1 TABLET (325 MG TOTAL) BY MOUTH DAILY. (Patient not taking: Reported on 07/08/2022)   gabapentin (NEURONTIN) 100 MG capsule Take 1 capsule (100 mg total) by mouth 3 (three) times daily. (Patient not taking: Reported on 07/08/2022)   ivabradine (CORLANOR) 5 MG TABS tablet Take 3 tablets by mouth two hours prior to cardiac CT scan. (Patient not taking: Reported on 07/08/2022)   oxyCODONE-acetaminophen (PERCOCET/ROXICET) 5-325 MG tablet Take 1 tablet by mouth every 6 (six) hours as needed for severe pain. (Patient not taking: Reported on 07/08/2022)   Plecanatide (TRULANCE) 3 MG TABS Take 3 mg by mouth daily.   sucralfate (CARAFATE) 1 g tablet Take 1 tablet (1 g total) by mouth every 6 (six) hours as needed. Slowly dissolve 1 tablet in 1 tablespoon of distilled water prior to ingestion.   [DISCONTINUED] glipiZIDE (GLUCOTROL) 10 MG tablet Take 1 tablet (10 mg total) by mouth 2 (two) times daily before a meal.   No facility-administered encounter medications on file as of 07/08/2022.     Review of Systems  Review of Systems  Constitutional: Negative.   HENT: Negative.    Cardiovascular: Negative.   Gastrointestinal:  Positive for abdominal pain and constipation.  Allergic/Immunologic: Negative.   Neurological: Negative.   Psychiatric/Behavioral:  Negative.         Physical Exam  BP 114/64   Pulse 98   Temp 98.2 F (36.8 C)   Wt 131 lb 3.2 oz (59.5 kg)   SpO2 100%   BMI 27.42 kg/m   Wt Readings from Last 5 Encounters:  07/08/22 131 lb 3.2 oz (59.5 kg)  07/05/22 131 lb 3.2 oz (59.5 kg)  06/29/22 131 lb (59.4 kg)  06/21/22 126 lb (57.2 kg)  05/18/22 126 lb (57.2 kg)     Physical Exam Vitals and nursing note reviewed.  Constitutional:      General: She is not in acute distress.    Appearance: She is well-developed.  Cardiovascular:  Rate and Rhythm: Normal rate and regular rhythm.  Pulmonary:     Effort: Pulmonary effort is normal.     Breath sounds: Normal breath sounds.  Abdominal:     Tenderness: There is abdominal tenderness in the right upper quadrant.  Neurological:     Mental Status: She is alert and oriented to person, place, and time.       Assessment & Plan:   Type 2 diabetes mellitus with hyperglycemia, without long-term current use of insulin (HCC) - Microalbumin/Creatinine Ratio, Urine - Ambulatory referral to Ophthalmology - CBC - Comprehensive metabolic panel  2. Right upper quadrant abdominal pain  - senna-docusate (SENOKOT-S) 8.6-50 MG tablet; Take 1 tablet by mouth daily for 5 days.  Dispense: 5 tablet; Refill: 0 - omeprazole (PRILOSEC) 20 MG capsule; Take 1 capsule (20 mg total) by mouth daily.  Dispense: 30 capsule; Refill: 3 - POCT URINALYSIS DIP (CLINITEK) - US Abdomen Limited RUQ (LIVER/GB) - CBC - Comprehensive metabolic panel  Follow up:  Follow up in 1 month     Fenton Foy, NP 07/08/2022

## 2022-07-09 ENCOUNTER — Ambulatory Visit: Payer: Self-pay | Admitting: Nurse Practitioner

## 2022-07-09 LAB — CBC
Hematocrit: 34.2 % (ref 34.0–46.6)
Hemoglobin: 11.4 g/dL (ref 11.1–15.9)
MCH: 30.7 pg (ref 26.6–33.0)
MCHC: 33.3 g/dL (ref 31.5–35.7)
MCV: 92 fL (ref 79–97)
Platelets: 335 10*3/uL (ref 150–450)
RBC: 3.71 x10E6/uL — ABNORMAL LOW (ref 3.77–5.28)
RDW: 12.8 % (ref 11.7–15.4)
WBC: 8.6 10*3/uL (ref 3.4–10.8)

## 2022-07-09 LAB — COMPREHENSIVE METABOLIC PANEL
ALT: 9 IU/L (ref 0–32)
AST: 11 IU/L (ref 0–40)
Albumin/Globulin Ratio: 1.4 (ref 1.2–2.2)
Albumin: 4.3 g/dL (ref 3.9–4.9)
Alkaline Phosphatase: 69 IU/L (ref 44–121)
BUN/Creatinine Ratio: 32 — ABNORMAL HIGH (ref 9–23)
BUN: 19 mg/dL (ref 6–24)
Bilirubin Total: 0.3 mg/dL (ref 0.0–1.2)
CO2: 20 mmol/L (ref 20–29)
Calcium: 9.5 mg/dL (ref 8.7–10.2)
Chloride: 103 mmol/L (ref 96–106)
Creatinine, Ser: 0.6 mg/dL (ref 0.57–1.00)
Globulin, Total: 3 g/dL (ref 1.5–4.5)
Glucose: 106 mg/dL — ABNORMAL HIGH (ref 70–99)
Potassium: 4.5 mmol/L (ref 3.5–5.2)
Sodium: 138 mmol/L (ref 134–144)
Total Protein: 7.3 g/dL (ref 6.0–8.5)
eGFR: 113 mL/min/{1.73_m2} (ref >59)

## 2022-07-11 LAB — NUSWAB VAGINITIS PLUS (VG+)
Candida albicans, NAA: NEGATIVE
Candida glabrata, NAA: NEGATIVE
Chlamydia trachomatis, NAA: NEGATIVE
Neisseria gonorrhoeae, NAA: NEGATIVE
Trich vag by NAA: NEGATIVE

## 2022-07-11 LAB — MICROALBUMIN / CREATININE URINE RATIO
Creatinine, Urine: 36.7 mg/dL
Microalb/Creat Ratio: 261 mg/g creat — ABNORMAL HIGH (ref 0–29)
Microalbumin, Urine: 95.8 ug/mL

## 2022-07-12 ENCOUNTER — Ambulatory Visit: Payer: Medicaid Other | Attending: Nurse Practitioner

## 2022-07-12 ENCOUNTER — Ambulatory Visit: Payer: Medicaid Other | Admitting: Physical Therapy

## 2022-07-12 DIAGNOSIS — R600 Localized edema: Secondary | ICD-10-CM | POA: Diagnosis present

## 2022-07-12 DIAGNOSIS — R42 Dizziness and giddiness: Secondary | ICD-10-CM | POA: Diagnosis present

## 2022-07-12 DIAGNOSIS — M6281 Muscle weakness (generalized): Secondary | ICD-10-CM | POA: Diagnosis present

## 2022-07-12 DIAGNOSIS — R278 Other lack of coordination: Secondary | ICD-10-CM | POA: Diagnosis present

## 2022-07-12 DIAGNOSIS — M25511 Pain in right shoulder: Secondary | ICD-10-CM | POA: Diagnosis present

## 2022-07-12 DIAGNOSIS — G8929 Other chronic pain: Secondary | ICD-10-CM | POA: Diagnosis present

## 2022-07-12 DIAGNOSIS — M5459 Other low back pain: Secondary | ICD-10-CM | POA: Insufficient documentation

## 2022-07-12 NOTE — Therapy (Signed)
OUTPATIENT PHYSICAL THERAPY THORACOLUMBAR TREATMENT   Patient Name: Betty Cain MRN: [id number] DOB:1975/07/09, 47 y.o., female Today's Date: 07/12/2022  END OF SESSION:  PT End of Session - 07/12/22 0931     Visit Number 11    Date for PT Re-Evaluation 07/20/22    Authorization Type [employer] blue    Authorization Time Period 2/24-4/23/24    Authorization - Visit Number 4    Authorization - Number of Visits 5    PT Start Time 0931    PT Stop Time [id number]    PT Time Calculation (min) 43 min    Activity Tolerance Patient tolerated treatment well    Behavior During Therapy [facility] for tasks assessed/performed                      Past Medical History:  Diagnosis Date   Allergy    Anemia    Anxiety 01/2019   Asthma    B12 deficiency    Back pain    Common migraine with intractable migraine 06/28/2017   Constipation    Diabetes 02/2019   Fatigue    GERD (gastroesophageal reflux disease)    pos H pylori   Hypertension    controlled with medication   IBS (irritable bowel syndrome) 2005   Joint pain    Neonatal death    Vaginal delivery, full term-lived x2 hours.    Palpitations    Shortness of breath    Shortness of breath on exertion    Spinal headache    Swelling of both lower extremities    Valvular heart disease    Vitamin D deficiency 02/2019   Past Surgical History:  Procedure Laterality Date   ADENOIDECTOMY     and tonsils (as a child)   BIOPSY  08/27/2021   Procedure: BIOPSY;  Surgeon: Yetta Flock, MD;  Location: [facility];  Service: Gastroenterology;;   boil  2003   right elbow   CESAREAN SECTION     x4   CESAREAN SECTION  06/02/2011   Procedure: CESAREAN SECTION;  Surgeon: [name], MD;  Location: [facility];  Service: Gynecology;  Laterality: N/A;  Primary Cesarean Section Delivery Baby Boy @ 0004, Apgars 9/9   CESAREAN SECTION N/A 12/10/2013   Procedure: REPEAT CESAREAN SECTION;  Surgeon: [name], MD;  Location: [facility];   Service: Obstetrics;  Laterality: N/A;   CESAREAN SECTION     colonoscopy  11/23/2018   stated had issues with sleep when in for procedure   ESOPHAGOGASTRODUODENOSCOPY (EGD) WITH PROPOFOL N/A 08/27/2021   Procedure: ESOPHAGOGASTRODUODENOSCOPY (EGD) WITH PROPOFOL;  Surgeon: Yetta Flock, MD;  Location: [facility];  Service: Gastroenterology;  Laterality: N/A;   OTHER SURGICAL HISTORY  2005   Uterine surgery    uterine cauterization     Patient Active Problem List   Diagnosis Date Noted   Small vessel disease 06/29/2022   Type 2 diabetes mellitus with obesity 03/25/2022   Hypotension - iatrogenic 03/18/2022   Other chronic pain 02/02/2022   Dyspepsia    Early satiety    IDA (iron deficiency anemia) 01/29/2021   Paroxysmal supraventricular tachycardia 10/01/2020   Class 1 obesity with serious comorbidity and body mass index (BMI) of 31.0 to 31.9 in adult 10/01/2020   Diabetes mellitus 02/14/2020   Mixed diabetic hyperlipidemia associated with type 2 diabetes mellitus 01/10/2020   Hypertension associated with diabetes 01/10/2020   Diabetic neuropathy 01/10/2020   Vitamin D deficiency 01/10/2020   Diabetes  mellitus with proteinuria [id number]   History of Helicobacter pylori infection 08/08/2019   Proteinuria 06/14/2019   Elevated sed rate 05/23/2019   Elevated C-reactive protein (CRP) 05/23/2019   Diabetes mellitus without complication [id number]   Dyslipidemia (high LDL; low HDL) 05/23/2019   Type 2 diabetes mellitus with hyperglycemia, without long-term current use of insulin 02/17/2019   Hemoglobin A1C between 7% and 9% indicating borderline diabetic control 02/17/2019   Breast cancer screening by mammogram 02/17/2019   Frequent headaches 02/14/2019   Anxiety 02/14/2019   Common migraine with intractable migraine 06/28/2017   Chest pain at rest 10/17/2016   Sinus tachycardia 08/18/2016   Prolonged Q-T interval on ECG 08/18/2016   Vertigo 07/22/2015   Abdominal  discomfort 02/14/2015   Low back pain 02/14/2015   Abdominal pain 02/01/2015   Nausea without vomiting 02/01/2015   Environmental allergies 02/01/2015   Language barrier to communication 02/01/2015   Anemia 01/23/2015   Constipation 01/21/2015   Knee pain, bilateral 01/21/2015   Palpitations 09/18/2013   Shortness of breath 09/18/2013   Advanced maternal age in pregnancy in second trimester 06/19/2013   IBS (irritable bowel syndrome) 04/13/2003    PCP: Fenton Foy, NP  REFERRING PROVIDER: Fenton Foy, NP  REFERRING DIAG:  [phone] (ICD-10-CM) - Chronic right shoulder pain  M54.9,G89.29 (ICD-10-CM) - Chronic mid back pain  R42 (ICD-10-CM) - Vertigo    Rationale for Evaluation and Treatment: Rehabilitation  THERAPY DIAG:  Chronic right shoulder pain  Dizziness and giddiness  Muscle weakness (generalized)  ONSET DATE: 04/09/22  SUBJECTIVE:                                                                                                                                                                                          In-person interpreter  SUBJECTIVE STATEMENT: "I am feeling good. Can I have some exercises for the neck pain?" She feels that her dizziness is getting a little bit better. She notices dizziness when she is a passenger in the car and when she gets out of bed in the morning.   PERTINENT HISTORY:  Patient presents today for a follow-up visit.  Patient does complain of right ear pain with some dizziness x 1 month.  Upon exam ears appear normal.  Patient does also complain of ongoing right shoulder pain and mid back pain.  This has been a chronic issue.  We will get x-rays today.  Will refer patient for physical therapy. Denies f/c/s, n/v/d, hemoptysis, PND, leg swelling  PAIN:  Are you having pain? Yes: NPRS scale: 7/10 Pain location: neck Pain description: "tense, pulling"  Aggravating factors: housework Relieving factors:  repeated  movement  PRECAUTIONS: None  WEIGHT BEARING RESTRICTIONS: No  FALLS:  Has patient fallen in last 6 months? No  LIVING ENVIRONMENT: Lives with: lives with their family Lives in: House/apartment Stairs: No Has following equipment at home: None  OCCUPATION: N/A  PLOF: Independent  PATIENT GOALS: Feel better   OBJECTIVE:   DIAGNOSTIC FINDINGS:  EXAM: RIGHT SHOULDER - 3 VIEWFINDINGS: There is no evidence of fracture or dislocation. There is no evidence of arthropathy or other focal bone abnormality. Soft tissues are unremarkable. THORACIC SPINE 3 VIEWS FINDINGS: There is no evidence of thoracic spine fracture. Alignment is normal. No other significant bone abnormalities are identified.   SCREENING FOR RED FLAGS: Bowel or bladder incontinence: No Spinal tumors: No Cauda equina syndrome: No Compression fracture: No Abdominal aneurysm: No  COGNITION: Overall cognitive status: Within functional limits for tasks assessed     SENSATION: Patient reports tingling and numbness in her toes, B.  MUSCLE LENGTH: Hamstrings: Right 75 deg; Left 65 deg   POSTURE: rounded shoulders, forward head, and flexed trunk   PALPATION: Tight and TTP on B cerv paraspinals, B up traps, R medial parascapular muscles, Ant cervical muscles, R > L, B pects, lower T spine and upper lumbar paraspinals  UE ROM: R shoulder P flex to 105, abd to 85 before it becomes painful  UPPER EXTREMITY ROM:  Active ROM Right 06/24/22 Left eval  Shoulder flexion Full pn    Shoulder extension    Shoulder abduction Full pn   Shoulder adduction    Shoulder extension    Shoulder internal rotation L5 pn   Shoulder external rotation T3 pn   Elbow flexion    Elbow extension    Wrist flexion    Wrist extension    Wrist ulnar deviation    Wrist radial deviation    Wrist pronation    Wrist supination     (Blank rows = not tested)  UE STRENGTH: R shoulder pain limits MMT  UPPER EXTREMITY MMT:  MMT  Right 07/06/22 Left   Shoulder flexion    Shoulder extension    Shoulder abduction    Shoulder adduction    Shoulder extension    Shoulder internal rotation    Shoulder external rotation    Middle trapezius    Lower trapezius    Elbow flexion    Elbow extension    Wrist flexion 4   Wrist extension 4   Wrist ulnar deviation    Wrist radial deviation    Wrist pronation    Wrist supination    Grip strength     (Blank rows = not tested)   UE SPECIAL TESTS: R Hawkins, Neer, empty can test all (+), belly press test (-)  CERVICAL ROM: Limited by pain in all directions, mostly ext and B lateral flex   Active ROM A/PROM 06/24/22  Flexion WNL  Extension WNL increased RUE pain  Right lateral flexion WNL  Left lateral flexion WNL  Right rotation WNL pain Rt upper trap  Left rotation WNL pain Rt upper trap    (Blank rows = not tested)  06/24/22:   THORACIC MOBILITY: Limited due to pain.  LOWER EXTREMITY ROM: Mild tightness noted in R knee to chest  LOWER EXTREMITY STRENGTH: B hips and knee 3+/5  FUNCTIONAL ASSESSMENT: Quick DASH 71%  VESTIBULAR ASSESSMENT: 05/12/22 Vestibular assessment- Observation- eyes aligned, tracking WNL Saccades- increased dizziness reported after 6 reps, Convergence-Dizziness reported at 15" VOR-(+) horizontal, (-) vertical VOR cancellation (-) horizontal,  deferred Vert due to fatigue.   Orthostatic BPs Supine- 99/79 HR 81BPM Sitting- 117/86 HR 82 BPM, 3 minutes- 102/85 HR81 Stand- 98/82 HR 84, 3 minutes- 102/86 HR 90   BPPV Dix-Hallpike (-) R, (+) L Performed L Epley maneuver with residual dizziness upon sitting.  GAIT: Distance walked: In clinic distances Assistive device utilized: None Level of assistance: Complete Independence Comments: [facility] Adult PT Treatment:                                                DATE: 07/12/22 Therapeutic Exercise: UBE level 1 x 5 minutes fwd  Seated cervical retraction with overpressure x 10  Supine  cervical retraction with overpressure x 10  Seated horizontal shoulder abduction red band 2 x 10  VOR horizontal and vertical x 1 sitting 80 bpm 2 trials each; 20 seconds horizontal, 1 minute vertical  Standing shoulder flexion 2 x 10; 1#  HEP review     [facility] Adult PT Treatment:                                                DATE: 07/06/22 Therapeutic Exercise: Supine cervical retraction x 10  Standing resisted shoulder extension yellow band 2 x 10  Standing resisted rows 2 x 10; red band  Seated cervical retraction with overpressure x 10  Resisted wrist flexion 2 x 10; 1# Resisted wrist extension 2 x 10; 1#  Bicep curl 2 x 10; 1# Reviewed HEP  Manual Therapy: Manual cervical distraction    [facility] Adult PT Treatment:                                                DATE: 07/02/22 Therapeutic Exercise: Supine cervical retraction x 10  Seated cervical retraction with overpressure 2 x 10  Supine horizontal shoulder abduction 2 x 8; yellow band Putty gripping x 10  Seated bicep curls 2 x 10; yellow band  Seated tricep extension 2 x 10; yellow band  Seated shoulder ER 2 x 10; yellow band  Updated HEP    [facility] Adult PT Treatment:                                                DATE: 06/24/22 Therapeutic Exercise: Seated cervical retraction x  10  Supine cervical retraction 2 x 10  Seated scapular retraction 2 x 10  Pec doorway stretch 2 x 30 sec  Updated HEP  Manual Therapy: Gentle cervical distraction STM/trigger point release Rt upper trap/levator scapulae    06/04/22 UBE L1 x81mins  Seated rows and lat pull downs 10# 2x10  QuickDash- 61.4% Brandt-Daroff exercises x3 Horizontal abd red 2x10 Ball roll up wall x10   05/28/22 Horizontal canal testing- positive for dizziness on R side  BBQ roll  VOR x1 standing 80 bps, then 100bps VOR x2 standing 80 bps Shoulder flexion 2# x10 Shoulder abd 2# x10  Rows and ext red 2x10 MH to R  shoulder and upper trap 10 mins   05/19/22 L  Epley- eyelids fluttered, and dizziness with each position STM to R supraspinatus, long head of biceps, pects, F/B stretch and humeral mobs in inf and post glide Shoulder retraction/depression in sitting with hands behind hips x 10 Scap protraction against G tband x 10, wall slides into flex x 10-painful VOR x 1 no dizziness hor or vert VOR x 2 dizziness reported   05/14/22 L Epley- negative but dizziness when sitting back up, goes away <20s  Saccades looking back and forth 2 targets- dizziness with vertical movements after stopping VOR x1 horizontal and vertical 20 reps- a little dizzy after looking vertically VOR x2 dizzy after 6 reps horizontal but second rep able to do 20 VOR x2 dizzy after 10 vertical reps, then does 20 but dizzy looking up STM with gun to bilateral UT  Manual cervical traction and STM to suboccipitals  Supine shoulder flexion 2# 2x10 Chest press 2# 2x10 PROM to R shoulder all directions and lateral distraction   PATIENT EDUCATION:  Education details: HEP Person educated: Patient and interpreter Education method: Explanation, Demonstration, Tactile cues, and Verbal cues Education comprehension: verbalized understanding, returned demonstration, verbal cues required, and tactile cues required  HOME EXERCISE PROGRAM: Access Code: [email or web address] URL: [email or web address] Date: 06/24/2022 Prepared by: Gwendolyn Grant  Exercises - Seated Scapular Retraction  - 2 x daily - 7 x weekly - 2 reps - 10 hold - Supine Cervical Retraction with Towel  - 2 x daily - 7 x weekly - 2 sets - 10 reps - Doorway Pec Stretch at 60 Elevation  - 1 x daily - 7 x weekly - 3 reps - 30 sec  hold - Seated Gaze Stabilization with Head Rotation and Horizontal Arm Movement  - 1 x daily - 7 x weekly - 2 sets - 20 reps - Seated Gaze Stabilization with Head Nod and Vertical Arm Movement  - 1 x daily - 7 x weekly - 2 sets - 20 reps  ASSESSMENT:  CLINICAL IMPRESSION: Patient reports no  RUE radicular pain, but isolates her pain to the neck upon arrival. Her radicular pain comes on quickly during session, but is centralized with repeated cervical retraction. Completed VOR training today with dizziness being provoked with horizontal and vertical VOR x 1 requiring seated rest break to resolve. Onset of dizziness occurs much faster with horizontal VOR reporting 5/10 dizziness after 20 seconds compared to vertical VOR 5/10 dizziness occurring after 1 minute. She reported a reduction in her neck pain at conclusion of session.   OBJECTIVE IMPAIRMENTS: decreased activity tolerance, decreased coordination, decreased mobility, decreased ROM, decreased strength, increased muscle spasms, impaired flexibility, impaired UE functional use, improper body mechanics, postural dysfunction, and pain.   ACTIVITY LIMITATIONS: carrying, lifting, bending, sitting, squatting, sleeping, bathing, dressing, reach over head, hygiene/grooming, and locomotion level  PARTICIPATION LIMITATIONS: meal prep, cleaning, laundry, and shopping  PERSONAL FACTORS: Past/current experiences are also affecting patient's functional outcome.   REHAB POTENTIAL: Good  CLINICAL DECISION MAKING: Evolving/moderate complexity  EVALUATION COMPLEXITY: Moderate   GOALS: Goals reviewed with patient? Yes  SHORT TERM GOALS: Target date: 04/27/22  I with initial HEP Baseline: Goal status: met  LONG TERM GOALS: Target date: 07/20/22  I with final HEP Baseline:  Goal status: 05/19/22-ongoing  2.  Patient will be able to perform her normal daily activities including housekeeping, cooking, self car, with R shoulder pain < 4/10 Baseline:  Goal status: 05/19/22- ongoing  3.  Increase R  shoulder strength to at least 4/5 Baseline:  Goal status: IN PROGRESS  4.  Increase BLE strength to at least 4/5 Baseline:  Goal status: ongoing  5.  Patient will report improvement in shoulder and back pain by at least 50% Baseline:  Goal  status: INITIAL  6.  Patient will report no dizziness while moving about throughout the day Baseline:  Goal status: INITIAL  PLAN:  PT FREQUENCY: 2x/week  PT DURATION: 12 weeks  PLANNED INTERVENTIONS: Therapeutic exercises, Therapeutic activity, Neuromuscular re-education, Balance training, Gait training, Patient/Family education, Self Care, Joint mobilization, Vestibular training, Canalith repositioning, Dry Needling, Electrical stimulation, Spinal mobilization, Cryotherapy, Moist heat, Taping, Vasopneumatic device, Traction, Ionotophoresis 4mg /ml Dexamethasone, and Manual therapy.  PLAN FOR NEXT SESSION:  vestibular re-ed with VOR, re-assess L horizontal canal. Review and progress HEP PRN; postural/RUE strengthening, consider nerve glides, repeated cervical movement.   Gwendolyn Grant, PT, DPT, ATC 07/12/22 10:15 AM

## 2022-07-14 ENCOUNTER — Other Ambulatory Visit: Payer: Self-pay | Admitting: Nurse Practitioner

## 2022-07-14 ENCOUNTER — Ambulatory Visit: Payer: Medicaid Other

## 2022-07-15 ENCOUNTER — Other Ambulatory Visit: Payer: Self-pay | Admitting: Adult Health

## 2022-07-16 ENCOUNTER — Ambulatory Visit (HOSPITAL_COMMUNITY)
Admission: RE | Admit: 2022-07-16 | Discharge: 2022-07-16 | Disposition: A | Discharge status: Home / Self Care | Payer: Medicaid Other | Source: Ambulatory Visit | Attending: Nurse Practitioner | Admitting: Nurse Practitioner

## 2022-07-16 DIAGNOSIS — R1011 Right upper quadrant pain: Secondary | ICD-10-CM | POA: Diagnosis not present

## 2022-07-19 ENCOUNTER — Encounter: Payer: Self-pay | Admitting: Physical Therapy

## 2022-07-19 ENCOUNTER — Ambulatory Visit: Payer: Medicaid Other | Admitting: Physical Therapy

## 2022-07-19 DIAGNOSIS — R278 Other lack of coordination: Secondary | ICD-10-CM

## 2022-07-19 DIAGNOSIS — M25511 Pain in right shoulder: Secondary | ICD-10-CM | POA: Diagnosis not present

## 2022-07-19 DIAGNOSIS — R42 Dizziness and giddiness: Secondary | ICD-10-CM

## 2022-07-19 NOTE — Telephone Encounter (Signed)
Pt was scheduled to be seen. Kh

## 2022-07-19 NOTE — Therapy (Addendum)
OUTPATIENT PHYSICAL THERAPY THORACOLUMBAR TREATMENT   Patient Name: Betty Cain MRN: [id number] DOB:09-18-1975, 47 y.o., female Today's Date: 07/19/2022  END OF SESSION:  PT End of Session - 07/19/22 0852     Visit Number 12    Date for PT Re-Evaluation 07/20/22    Authorization Type [employer] blue    Authorization Time Period auth request pending for more visits on 07/19/22    Authorization - Visit Number 5    Authorization - Number of Visits 5    PT Start Time [phone]    PT Stop Time 0935    PT Time Calculation (min) 43 min    Activity Tolerance Patient tolerated treatment well    Behavior During Therapy [facility] for tasks assessed/performed                      Past Medical History:  Diagnosis Date   Allergy    Anemia    Anxiety 01/2019   Asthma    B12 deficiency    Back pain    Common migraine with intractable migraine 06/28/2017   Constipation    Diabetes 02/2019   Fatigue    GERD (gastroesophageal reflux disease)    pos H pylori   Hypertension    controlled with medication   IBS (irritable bowel syndrome) 2005   Joint pain    Neonatal death    Vaginal delivery, full term-lived x2 hours.    Palpitations    Shortness of breath    Shortness of breath on exertion    Spinal headache    Swelling of both lower extremities    Valvular heart disease    Vitamin D deficiency 02/2019   Past Surgical History:  Procedure Laterality Date   ADENOIDECTOMY     and tonsils (as a child)   BIOPSY  08/27/2021   Procedure: BIOPSY;  Surgeon: Benancio Deeds, MD;  Location: [facility];  Service: Gastroenterology;;   boil  2003   right elbow   CESAREAN SECTION     x4   CESAREAN SECTION  06/02/2011   Procedure: CESAREAN SECTION;  Surgeon: Tilda Burrow, MD;  Location: [facility];  Service: Gynecology;  Laterality: N/A;  Primary Cesarean Section Delivery Baby Boy @ 0004, Apgars 9/9   CESAREAN SECTION N/A 12/10/2013   Procedure: REPEAT CESAREAN SECTION;  Surgeon: Catalina Antigua, MD;  Location: [facility];  Service: Obstetrics;  Laterality: N/A;   CESAREAN SECTION     colonoscopy  11/23/2018   stated had issues with sleep when in for procedure   ESOPHAGOGASTRODUODENOSCOPY (EGD) WITH PROPOFOL N/A 08/27/2021   Procedure: ESOPHAGOGASTRODUODENOSCOPY (EGD) WITH PROPOFOL;  Surgeon: Benancio Deeds, MD;  Location: [facility];  Service: Gastroenterology;  Laterality: N/A;   OTHER SURGICAL HISTORY  2005   Uterine surgery    uterine cauterization     Patient Active Problem List   Diagnosis Date Noted   Small vessel disease 06/29/2022   Type 2 diabetes mellitus with obesity 03/25/2022   Hypotension - iatrogenic 03/18/2022   Other chronic pain 02/02/2022   Dyspepsia    Early satiety    IDA (iron deficiency anemia) 01/29/2021   Paroxysmal supraventricular tachycardia 10/01/2020   Class 1 obesity with serious comorbidity and body mass index (BMI) of 31.0 to 31.9 in adult 10/01/2020   Diabetes mellitus 02/14/2020   Mixed diabetic hyperlipidemia associated with type 2 diabetes mellitus 01/10/2020   Hypertension associated with diabetes 01/10/2020   Diabetic neuropathy 01/10/2020  Vitamin D deficiency 01/10/2020   Diabetes mellitus with proteinuria 08/20/2019   History of Helicobacter pylori infection 08/08/2019   Proteinuria 06/14/2019   Elevated sed rate 05/23/2019   Elevated C-reactive protein (CRP) 05/23/2019   Diabetes mellitus without complication 05/23/2019   Dyslipidemia (high LDL; low HDL) 05/23/2019   Type 2 diabetes mellitus with hyperglycemia, without long-term current use of insulin 02/17/2019   Hemoglobin A1C between 7% and 9% indicating borderline diabetic control 02/17/2019   Breast cancer screening by mammogram 02/17/2019   Frequent headaches 02/14/2019   Anxiety 02/14/2019   Common migraine with intractable migraine 06/28/2017   Chest pain at rest 10/17/2016   Sinus tachycardia 08/18/2016   Prolonged Q-T interval on ECG 08/18/2016    Vertigo 07/22/2015   Abdominal discomfort 02/14/2015   Low back pain 02/14/2015   Abdominal pain 02/01/2015   Nausea without vomiting 02/01/2015   Environmental allergies 02/01/2015   Language barrier to communication 02/01/2015   Anemia 01/23/2015   Constipation 01/21/2015   Knee pain, bilateral 01/21/2015   Palpitations 09/18/2013   Shortness of breath 09/18/2013   Advanced maternal age in pregnancy in second trimester 06/19/2013   IBS (irritable bowel syndrome) 04/13/2003    PCP: Ivonne Andrew, NP  REFERRING PROVIDER: Ivonne Andrew, NP  REFERRING DIAG:  [phone] (ICD-10-CM) - Chronic right shoulder pain  M54.9,G89.29 (ICD-10-CM) - Chronic mid back pain  R42 (ICD-10-CM) - Vertigo    Rationale for Evaluation and Treatment: Rehabilitation  THERAPY DIAG:  Dizziness and giddiness  Other lack of coordination  ONSET DATE: 04/09/22  SUBJECTIVE:                                                                                                                                                                                          In-person interpreter  SUBJECTIVE STATEMENT: Pt states neck and dizziness have been getting better. Has been compliant with exercises. States dizziness is primarily when she first gets up in the morning.   PERTINENT HISTORY:  Patient presents today for a follow-up visit.  Patient does complain of right ear pain with some dizziness x 1 month.  Upon exam ears appear normal.  Patient does also complain of ongoing right shoulder pain and mid back pain.  This has been a chronic issue.  We will get x-rays today.  Will refer patient for physical therapy. Denies f/c/s, n/v/d, hemoptysis, PND, leg swelling  PAIN:  Are you having pain? Yes: NPRS scale: 5/10 Pain location: neck Pain description: "tense, pulling"  Aggravating factors: housework Relieving factors: repeated movement  PRECAUTIONS: None  WEIGHT BEARING RESTRICTIONS: No  FALLS:  Has  patient  fallen in last 6 months? No  LIVING ENVIRONMENT: Lives with: lives with their family Lives in: House/apartment Stairs: No Has following equipment at home: None  OCCUPATION: N/A  PLOF: Independent  PATIENT GOALS: Feel better   OBJECTIVE:   DIAGNOSTIC FINDINGS:  EXAM: RIGHT SHOULDER - 3 VIEWFINDINGS: There is no evidence of fracture or dislocation. There is no evidence of arthropathy or other focal bone abnormality. Soft tissues are unremarkable. THORACIC SPINE 3 VIEWS FINDINGS: There is no evidence of thoracic spine fracture. Alignment is normal. No other significant bone abnormalities are identified.   SCREENING FOR RED FLAGS: Bowel or bladder incontinence: No Spinal tumors: No Cauda equina syndrome: No Compression fracture: No Abdominal aneurysm: No  COGNITION: Overall cognitive status: Within functional limits for tasks assessed     SENSATION: Patient reports tingling and numbness in her toes, B.  MUSCLE LENGTH: Hamstrings: Right 75 deg; Left 65 deg   POSTURE: rounded shoulders, forward head, and flexed trunk   PALPATION: Tight and TTP on B cerv paraspinals, B up traps, R medial parascapular muscles, Ant cervical muscles, R > L, B pects, lower T spine and upper lumbar paraspinals  UE ROM: R shoulder P flex to 105, abd to 85 before it becomes painful  UPPER EXTREMITY ROM:  Active ROM Right 06/24/22 Left eval  Shoulder flexion Full pn    Shoulder extension    Shoulder abduction Full pn   Shoulder adduction    Shoulder extension    Shoulder internal rotation L5 pn   Shoulder external rotation T3 pn   Elbow flexion    Elbow extension    Wrist flexion    Wrist extension    Wrist ulnar deviation    Wrist radial deviation    Wrist pronation    Wrist supination     (Blank rows = not tested)  UE STRENGTH: R shoulder pain limits MMT  UPPER EXTREMITY MMT:  MMT Right 07/06/22 Left   Shoulder flexion    Shoulder extension    Shoulder  abduction    Shoulder adduction    Shoulder extension    Shoulder internal rotation    Shoulder external rotation    Middle trapezius    Lower trapezius    Elbow flexion    Elbow extension    Wrist flexion 4   Wrist extension 4   Wrist ulnar deviation    Wrist radial deviation    Wrist pronation    Wrist supination    Grip strength     (Blank rows = not tested)   UE SPECIAL TESTS: R Hawkins, Neer, empty can test all (+), belly press test (-)  CERVICAL ROM: Limited by pain in all directions, mostly ext and B lateral flex   Active ROM A/PROM 06/24/22  Flexion WNL  Extension WNL increased RUE pain  Right lateral flexion WNL  Left lateral flexion WNL  Right rotation WNL pain Rt upper trap  Left rotation WNL pain Rt upper trap    (Blank rows = not tested)  06/24/22:   THORACIC MOBILITY: Limited due to pain.  LOWER EXTREMITY ROM: Mild tightness noted in R knee to chest  LOWER EXTREMITY STRENGTH: B hips and knee 3+/5  FUNCTIONAL ASSESSMENT: Quick DASH 71%  VESTIBULAR ASSESSMENT: 05/12/22 Vestibular assessment- Observation- eyes aligned, tracking WNL Saccades- increased dizziness reported after 6 reps, Convergence-Dizziness reported at 15" VOR-(+) horizontal, (-) vertical VOR cancellation (-) horizontal, deferred Vert due to fatigue.   Orthostatic BPs Supine- 99/79 HR 81BPM Sitting- 117/86 HR  82 BPM, 3 minutes- 102/85 HR81 Stand- 98/82 HR 84, 3 minutes- 102/86 HR 90   BPPV Dix-Hallpike (-) R, (+) L Performed L Epley maneuver with residual dizziness upon sitting.   07/19/22: Dix-Hallpike (-) R & L  GAIT: Distance walked: In clinic distances Assistive device utilized: None Level of assistance: Complete Independence [facility]  [facility] PT Treatment:                                                DATE: 07/19/22 Therapeutic Exercise: UBE L1 x 5 min fwd/bwd Supine cervical retraction with overpressure x 10 Cervical lateral flexion iso 10x5 sec Cervical  retraction + rotation iso 5x5 sec Shoulder ER red TB 2x10 "W" 2x10 Manual Therapy: STM & TPR cervical paraspinals, UTs, suboccipitals Pin and stretch UT Neuromuscular re-ed: VOR horizontal and vertical x 1 sitting 80 bpm 2 trials each; 30 seconds horizonta & vertical  Saccades 2x30 sec horizontal and vertical Smooth pursuit 2x30 sec horizontal and then vertical    [facility] Adult PT Treatment:                                                DATE: 07/12/22 Therapeutic Exercise: UBE level 1 x 5 minutes fwd  Seated cervical retraction with overpressure x 10  Supine cervical retraction with overpressure x 10  Seated horizontal shoulder abduction red band 2 x 10  VOR horizontal and vertical x 1 sitting 80 bpm 2 trials each; 20 seconds horizontal, 1 minute vertical  Standing shoulder flexion 2 x 10; 1#  HEP review    [facility] Adult PT Treatment:                                                DATE: 07/06/22 Therapeutic Exercise: Supine cervical retraction x 10  Standing resisted shoulder extension yellow band 2 x 10  Standing resisted rows 2 x 10; red band  Seated cervical retraction with overpressure x 10  Resisted wrist flexion 2 x 10; 1# Resisted wrist extension 2 x 10; 1#  Bicep curl 2 x 10; 1# Reviewed HEP  Manual Therapy: Manual cervical distraction    [facility] Adult PT Treatment:                                                DATE: 07/02/22 Therapeutic Exercise: Supine cervical retraction x 10  Seated cervical retraction with overpressure 2 x 10  Supine horizontal shoulder abduction 2 x 8; yellow band Putty gripping x 10  Seated bicep curls 2 x 10; yellow band  Seated tricep extension 2 x 10; yellow band  Seated shoulder ER 2 x 10; yellow band  Updated HEP    [facility] Adult PT Treatment:  DATE: 06/24/22 Therapeutic Exercise: Seated cervical retraction x  10  Supine cervical retraction 2 x 10  Seated scapular retraction 2 x 10  Pec doorway  stretch 2 x 30 sec  Updated HEP  Manual Therapy: Gentle cervical distraction STM/trigger point release Rt upper trap/levator scapulae    06/04/22 UBE L1 x9mins  Seated rows and lat pull downs 10# 2x10  QuickDash- 61.4% Brandt-Daroff exercises x3 Horizontal abd red 2x10 Ball roll up wall x10   05/28/22 Horizontal canal testing- positive for dizziness on R side  BBQ roll  VOR x1 standing 80 bps, then 100bps VOR x2 standing 80 bps Shoulder flexion 2# x10 Shoulder abd 2# x10  Rows and ext red 2x10 MH to R shoulder and upper trap 10 mins   05/19/22 L Epley- eyelids fluttered, and dizziness with each position STM to R supraspinatus, long head of biceps, pects, F/B stretch and humeral mobs in inf and post glide Shoulder retraction/depression in sitting with hands behind hips x 10 Scap protraction against G tband x 10, wall slides into flex x 10-painful VOR x 1 no dizziness hor or vert VOR x 2 dizziness reported   05/14/22 L Epley- negative but dizziness when sitting back up, goes away <20s  Saccades looking back and forth 2 targets- dizziness with vertical movements after stopping VOR x1 horizontal and vertical 20 reps- a little dizzy after looking vertically VOR x2 dizzy after 6 reps horizontal but second rep able to do 20 VOR x2 dizzy after 10 vertical reps, then does 20 but dizzy looking up STM with gun to bilateral UT  Manual cervical traction and STM to suboccipitals  Supine shoulder flexion 2# 2x10 Chest press 2# 2x10 PROM to R shoulder all directions and lateral distraction   PATIENT EDUCATION:  Education details: HEP Person educated: Patient and interpreter Education method: Explanation, Demonstration, Tactile cues, and Verbal cues Education comprehension: verbalized understanding, returned demonstration, verbal cues required, and tactile cues required  HOME EXERCISE PROGRAM: Access Code: [email or web address] URL: [email or web address] Date: 06/24/2022 Prepared  by: Letitia Libra  Exercises - Seated Scapular Retraction  - 2 x daily - 7 x weekly - 2 reps - 10 hold - Supine Cervical Retraction with Towel  - 2 x daily - 7 x weekly - 2 sets - 10 reps - Doorway Pec Stretch at 60 Elevation  - 1 x daily - 7 x weekly - 3 reps - 30 sec  hold - Seated Gaze Stabilization with Head Rotation and Horizontal Arm Movement  - 1 x daily - 7 x weekly - 2 sets - 20 reps - Seated Gaze Stabilization with Head Nod and Vertical Arm Movement  - 1 x daily - 7 x weekly - 2 sets - 20 reps  ASSESSMENT:  CLINICAL IMPRESSION: Pt notes improving dizziness and neck pain. Pain has decreased to 5/10 with activities (was 7/10 prior). Continued spasm in suboccipitals and mid/post scalenes. No dizziness with Dix-Hallpike or supine positioning noted. Continued to work on gaze stability and oculomotor exercises to address dizziness. No complaints of radicular pain this session. Pt has full cervical ROM but has pain at end ranges. Pt continues to demo R>L UE weakness. Pt would benefit from continued PT to fully meet all of her goals and improve her strength.   OBJECTIVE IMPAIRMENTS: decreased activity tolerance, decreased coordination, decreased mobility, decreased ROM, decreased strength, increased muscle spasms, impaired flexibility, impaired UE functional use, improper body mechanics, postural dysfunction, and pain.   ACTIVITY LIMITATIONS: carrying, lifting, bending, sitting,  squatting, sleeping, bathing, dressing, reach over head, hygiene/grooming, and locomotion level  PARTICIPATION LIMITATIONS: meal prep, cleaning, laundry, and shopping  PERSONAL FACTORS: Past/current experiences are also affecting patient's functional outcome.   REHAB POTENTIAL: Good  CLINICAL DECISION MAKING: Evolving/moderate complexity  EVALUATION COMPLEXITY: Moderate   GOALS: Goals reviewed with patient? Yes  SHORT TERM GOALS: Target date: 04/27/22  I with initial HEP Baseline: Goal status: met  LONG  TERM GOALS: Target date: 07/20/22  I with final HEP Baseline:  Goal status: 05/19/22-ongoing  2.  Patient will be able to perform her normal daily activities including housekeeping, cooking, self car, with R shoulder pain < 4/10 Baseline:  05/19/22- ongoing pain 7/10 Goal status: 07/19/22 - ongoing, pain is 5/10  3.  Increase R shoulder strength to at least 4/5 Baseline:  07/02/22 - ongoing, using yellow TB Goal status: 07/19/22 - IN PROGRESS, tolerates 1# weight and red TB  4.  Increase BLE strength to at least 4/5 Baseline:  Goal status: 07/19/22 - ongoing  5.  Patient will report improvement in shoulder and back pain by at least 50% Baseline:  Goal status: 07/19/22 - Ongoing, ~30% reduction based on NPS  6.  Patient will report no dizziness while moving about throughout the day Baseline:  Goal status: 07/19/22 - Ongoing, dizziness only with initially getting up in the morning and riding in the car now  PLAN:  PT FREQUENCY: 2x/week  PT DURATION: 12 weeks  PLANNED INTERVENTIONS: Therapeutic exercises, Therapeutic activity, Neuromuscular re-education, Balance training, Gait training, Patient/Family education, Self Care, Joint mobilization, Vestibular training, Canalith repositioning, Dry Needling, Electrical stimulation, Spinal mobilization, Cryotherapy, Moist heat, Taping, Vasopneumatic device, Traction, Ionotophoresis 4mg /ml Dexamethasone, and Manual therapy.  PLAN FOR NEXT SESSION:  vestibular re-ed with VOR. Review and progress HEP PRN; postural/RUE strengthening, consider nerve glides, repeated cervical movement.   Vale Mousseau April Dell Ponto, PT 07/19/22 11:52 AM  Check all possible CPT codes: 95621 - PT Re-evaluation, 97110- Therapeutic Exercise, [phone]- Neuro Re-education, [phone] - Gait Training, [phone] - Manual Therapy, [phone] - Therapeutic Activities, [phone] - Self Care, and [phone] - Aquatic therapy    Check all conditions that are expected to impact treatment: Musculoskeletal  disorders   If treatment provided at initial evaluation, no treatment charged due to lack of authorization.

## 2022-07-23 ENCOUNTER — Encounter: Payer: Self-pay | Admitting: Nurse Practitioner

## 2022-07-23 ENCOUNTER — Telehealth: Payer: Self-pay | Admitting: Gastroenterology

## 2022-07-23 ENCOUNTER — Ambulatory Visit: Payer: Medicaid Other | Admitting: Nurse Practitioner

## 2022-07-23 VITALS — BP 112/70 | HR 91 | Temp 97.4°F | Wt 131.2 lb

## 2022-07-23 DIAGNOSIS — R809 Proteinuria, unspecified: Secondary | ICD-10-CM

## 2022-07-23 DIAGNOSIS — E1165 Type 2 diabetes mellitus with hyperglycemia: Secondary | ICD-10-CM

## 2022-07-23 DIAGNOSIS — S0300XA Dislocation of jaw, unspecified side, initial encounter: Secondary | ICD-10-CM | POA: Diagnosis not present

## 2022-07-23 DIAGNOSIS — R319 Hematuria, unspecified: Secondary | ICD-10-CM

## 2022-07-23 DIAGNOSIS — R1084 Generalized abdominal pain: Secondary | ICD-10-CM

## 2022-07-23 DIAGNOSIS — G8929 Other chronic pain: Secondary | ICD-10-CM | POA: Diagnosis not present

## 2022-07-23 LAB — POCT URINALYSIS DIP (CLINITEK)
Bilirubin, UA: NEGATIVE
Glucose, UA: NEGATIVE mg/dL
Ketones, POC UA: NEGATIVE mg/dL
Leukocytes, UA: NEGATIVE
Nitrite, UA: NEGATIVE
POC PROTEIN,UA: 100 — AB
Spec Grav, UA: 1.025 (ref 1.010–1.025)
Urobilinogen, UA: 1 E.U./dL
pH, UA: 7 (ref 5.0–8.0)

## 2022-07-23 MED ORDER — DOCUSATE SODIUM 100 MG PO CAPS: 100.0000 mg | ORAL_CAPSULE | Freq: Two times a day (BID) | ORAL | 0 refills | Status: AC

## 2022-07-23 NOTE — Telephone Encounter (Signed)
Patient's PCP called stating she had an appointment today 07/23/2022. Provider is wondering if there is a way to get patient to be seen earlier. Please call [phone] and ask for [place]. Please advise.

## 2022-07-23 NOTE — Progress Notes (Signed)
  ID: Betty Cain, female    DOB: 1975-08-21, 47 y.o.   MRN: [id number]  Chief Complaint  Patient presents with   Abdominal Pain    For about a month   Jaw Pain    Started in her ears over two weeks     Referring provider: Ivonne Andrew, NP   HPI  Patient presents today for follow up on diabetes.  Patient did have A1c checked on 06/21/2022 with neurology.  Her A1c was at 5.2.     Abdominal Pain: Patient does complain of stomach pain for the past 4 weeks.  She was seen for this at this office on 07/08/2022.  She did have an abdominal ultrasound which was clear.  She was prescribed Senokot due to constipation but did not take this medication.  Upon exam she was very tender over her right upper quadrant.  She denies any significant vomiting.  Patient also complains today of TMJ symptoms including pain to bilateral jaw and decreased range of motion to jaw.  Will place referral to oral surgeon.  Denies f/c/s, n/v/d, hemoptysis, PND, leg swelling Denies chest pain or edema   Note: Microalbumin at last visit was elevated.  UA today showed proteinuria and hematuria.  We will place a referral to nephrology.     Allergies  Allergen Reactions   Topamax [Topiramate] Itching and Rash    Immunization History  Administered Date(s) Administered   Influenza,inj,Quad PF,6+ Mos 06/19/2013, 12/02/2015   PFIZER(Purple Top)SARS-COV-2 Vaccination 07/07/2019, 07/28/2019   Pneumococcal Conjugate-13 05/21/2019   Tdap 09/13/2013, 12/11/2013    Past Medical History:  Diagnosis Date   Allergy    Anemia    Anxiety 01/2019   Asthma    B12 deficiency    Back pain    Common migraine with intractable migraine 06/28/2017   Constipation    Diabetes 02/2019   Fatigue    GERD (gastroesophageal reflux disease)    pos H pylori   Hypertension    controlled with medication   IBS (irritable bowel syndrome) 2005   Joint pain    Neonatal death    Vaginal delivery, full term-lived x2 hours.     Palpitations    Shortness of breath    Shortness of breath on exertion    Spinal headache    Swelling of both lower extremities    Valvular heart disease    Vitamin D deficiency 02/2019    Tobacco History: Social History   Tobacco Use  Smoking Status Never  Smokeless Tobacco Never   Counseling given: Not Answered   Outpatient Encounter Medications as of 07/23/2022  Medication Sig   Accu-Chek Softclix Lancets lancets USE UP TO FOUR TIMES DAILY AS DIRECTED   acetaminophen (TYLENOL) 500 MG tablet Take 500 mg by mouth every 6 (six) hours as needed.   bisoprolol (ZEBETA) 5 MG tablet Take 1 tablet (5 mg total) by mouth 2 (two) times daily.   blood glucose meter kit and supplies KIT Dispense based on patient and insurance preference. Use up to four times daily as directed.   Blood Pressure Monitoring DEVI 1 each by Does not apply route daily.   cloNIDine (CATAPRES) 0.1 MG tablet Take 1 tablet (0.1 mg total) by mouth 2 (two) times daily.   docusate sodium (COLACE) 100 MG capsule Take 1 capsule (100 mg total) by mouth 2 (two) times daily.   fexofenadine (ALLEGRA ALLERGY) 180 MG tablet Take 1 tablet (180 mg total) by mouth daily.   fluticasone (FLONASE)  50 MCG/ACT nasal spray PLACE 2 SPRAYS INTO BOTH NOSTRILS DAILY.   Fremanezumab-vfrm (AJOVY) 225 MG/1.5ML SOAJ Inject 225 mg into the skin every 30 (thirty) days.   glucose blood (ACCU-CHEK GUIDE) test strip Use as instructed   meloxicam (MOBIC) 15 MG tablet Take 1 tablet (15 mg total) by mouth daily.   methylcellulose (CITRUCEL) oral powder Take as directed, daily   metoCLOPramide (REGLAN) 5 MG tablet Take 1 tablet (5 mg total) by mouth 3 (three) times daily as needed for nausea. Please schedule a follow up appointment for further refills. Thank you   Multiple Vitamin (MULTI VITAMIN DAILY PO) Take by mouth.   omeprazole (PRILOSEC) 20 MG capsule Take 1 capsule (20 mg total) by mouth daily.   ondansetron (ZOFRAN-ODT) 8 MG disintegrating tablet  TAKE 1 TABLET BY MOUTH EVERY 8 HOURS AS NEEDED FOR NAUSEA OR VOMITING.   pantoprazole (PROTONIX) 40 MG tablet Take 1 tablet (40 mg total) by mouth 2 (two) times daily before a meal.   Plecanatide (TRULANCE) 3 MG TABS Take 3 mg by mouth daily.   potassium chloride SA (KLOR-CON M) 20 MEQ tablet Take 1 tablet by mouth once daily   Rimegepant Sulfate 75 MG TBDP Take 1 tab at onset of migraine. May repeat in 2 hrs, if needed. Max dose: 2 tabs/day or 15/month. This is a 90 day rx.   rosuvastatin (CRESTOR) 5 MG tablet TAKE 1 TABLET (5 MG TOTAL) BY MOUTH AT BEDTIME.   Semaglutide,0.25 or 0.5MG /DOS, (OZEMPIC, 0.25 OR 0.5 MG/DOSE,) 2 MG/1.5ML SOPN Inject 0.5 mg into the skin once a week.   SPRINTEC 28 0.25-35 MG-MCG tablet Take 1 tablet by mouth once daily   sucralfate (CARAFATE) 1 g tablet Take 1 tablet (1 g total) by mouth every 6 (six) hours as needed. Slowly dissolve 1 tablet in 1 tablespoon of distilled water prior to ingestion.   VENTOLIN HFA 108 (90 Base) MCG/ACT inhaler INHALE 2 PUFFS BY MOUTH EVERY 6 HOURS AS NEEDED FOR WHEEZING FOR SHORTNESS OF BREATH   dicyclomine (BENTYL) 10 MG/5ML solution Take 5 mLs (10 mg total) by mouth 4 (four) times daily -  before meals and at bedtime. (Patient not taking: Reported on 07/08/2022)   ferrous sulfate (FEROSUL) 325 (65 FE) MG tablet TAKE 1 TABLET (325 MG TOTAL) BY MOUTH DAILY. (Patient not taking: Reported on 07/08/2022)   gabapentin (NEURONTIN) 100 MG capsule Take 1 capsule (100 mg total) by mouth 3 (three) times daily. (Patient not taking: Reported on 07/08/2022)   ivabradine (CORLANOR) 5 MG TABS tablet Take 3 tablets by mouth two hours prior to cardiac CT scan. (Patient not taking: Reported on 07/23/2022)   oxyCODONE-acetaminophen (PERCOCET/ROXICET) 5-325 MG tablet Take 1 tablet by mouth every 6 (six) hours as needed for severe pain. (Patient not taking: Reported on 07/08/2022)   [DISCONTINUED] glipiZIDE (GLUCOTROL) 10 MG tablet Take 1 tablet (10 mg total) by mouth  2 (two) times daily before a meal.   No facility-administered encounter medications on file as of 07/23/2022.     Review of Systems  Review of Systems  Constitutional: Negative.   HENT: Negative.    Cardiovascular: Negative.   Gastrointestinal:  Positive for abdominal pain and constipation.  Genitourinary:  Positive for flank pain.  Allergic/Immunologic: Negative.   Neurological: Negative.   Psychiatric/Behavioral: Negative.         Physical Exam  BP 112/70   Pulse 91   Temp (!) 97.4 F (36.3 C)   Wt 131 lb 3.2 oz (59.5  kg)   SpO2 100%   BMI 27.42 kg/m   Wt Readings from Last 5 Encounters:  07/23/22 131 lb 3.2 oz (59.5 kg)  07/08/22 131 lb 3.2 oz (59.5 kg)  07/05/22 131 lb 3.2 oz (59.5 kg)  06/29/22 131 lb (59.4 kg)  06/21/22 126 lb (57.2 kg)     Physical Exam Vitals and nursing note reviewed.  Constitutional:      General: She is not in acute distress.    Appearance: She is well-developed.  Cardiovascular:     Rate and Rhythm: Normal rate and regular rhythm.  Pulmonary:     Effort: Pulmonary effort is normal.     Breath sounds: Normal breath sounds.  Abdominal:     Tenderness: There is abdominal tenderness in the right upper quadrant.  Neurological:     Mental Status: She is alert and oriented to person, place, and time.       Assessment & Plan:   Dislocation of jaw - Ambulatory referral to Oral Maxillofacial Surgery  2. Chronic generalized abdominal pain  - POCT URINALYSIS DIP (CLINITEK)  3. Type 2 diabetes mellitus with hyperglycemia, without long-term current use of insulin  - Microalbumin/Creatinine Ratio, Urine - Ambulatory referral to Nephrology  4. Proteinuria, unspecified type  - Ambulatory referral to Nephrology  5. Hematuria, unspecified type  - Ambulatory referral to Nephrology  Follow up:  Follow up in 3 months     Ivonne Andrew, NP 07/23/2022

## 2022-07-23 NOTE — Patient Instructions (Addendum)
1. Dislocation of temporomandibular joint, initial encounter  - Ambulatory referral to Oral Maxillofacial Surgery  2. Chronic generalized abdominal pain  - POCT URINALYSIS DIP (CLINITEK)  3. Type 2 diabetes mellitus with hyperglycemia, without long-term current use of insulin  - Microalbumin/Creatinine Ratio, Urine - Ambulatory referral to Nephrology  4. Proteinuria, unspecified type  - Ambulatory referral to Nephrology  5. Hematuria, unspecified type  - Ambulatory referral to Nephrology  Follow up:  Follow up in 3 months     Temporomandibular Joint Syndrome  Temporomandibular joint syndrome (TMJ syndrome) is a condition that causes pain in the temporomandibular joints. These joints are located near your ears and allow your jaw to open and close. For people with TMJ syndrome, chewing, biting, or other movements of the jaw can be difficult or painful. TMJ syndrome is often mild and goes away within a few weeks. However, sometimes the condition becomes a long-term (chronic) problem. What are the causes? This condition may be caused by: Grinding your teeth or clenching your jaw. Some people do this when they are stressed. Arthritis. An injury to the jaw. A head or neck injury. Teeth or dentures that are not aligned well. In some cases, the cause of TMJ syndrome may not be known. What are the signs or symptoms? The most common symptom of this condition is aching pain on the side of the head in the area of the TMJ. Other symptoms may include: Pain when moving your jaw, such as when chewing or biting. Not being able to open your jaw all the way. Making a clicking sound when you open your mouth. Headache. Earache. Neck or shoulder pain. How is this diagnosed? This condition may be diagnosed based on: Your symptoms and medical history. A physical exam. Your health care provider may check the range of motion of your jaw. Imaging tests, such as X-rays or an MRI. You may  also need to see your dentist, who will check if your teeth and jaw are lined up correctly. How is this treated? TMJ syndrome often goes away on its own. If treatment is needed, it may include: Eating soft foods and applying ice or heat. Medicines to relieve pain or inflammation. Medicines or massage to relax the muscles. A splint, bite plate, or mouthpiece to prevent teeth grinding or jaw clenching. Relaxation techniques or counseling to help reduce stress. A therapy for pain in which an electrical current is applied to the nerves through the skin (transcutaneous electrical nerve stimulation). Acupuncture. This may help to relieve pain. Jaw surgery. This is rarely needed. Follow these instructions at home:  Eating and drinking Eat a soft diet if you are having trouble chewing. Avoid foods that require a lot of chewing. Do not chew gum. General instructions Take over-the-counter and prescription medicines only as told by your health care provider. If directed, put ice on the painful area. To do this: Put ice in a plastic bag. Place a towel between your skin and the bag. Leave the ice on for 20 minutes, 2-3 times a day. Remove the ice if your skin turns bright red. This is very important. If you cannot feel pain, heat, or cold, you have a greater risk of damage to the area. Apply a warm, wet cloth (warm compress) to the painful area as told. Massage your jaw area and do any jaw stretching exercises as told by your health care provider. If you were given a splint, bite plate, or mouthpiece, wear it as told by your health  care provider. Keep all follow-up visits. This is important. Where to find more information [employer]: [employer] Contact a health care provider if: You have trouble eating. You have new or worsening symptoms. Get help right away if: Your jaw locks. Summary Temporomandibular joint syndrome (TMJ syndrome) is a  condition that causes pain in the temporomandibular joints. These joints are located near your ears and allow your jaw to open and close. TMJ syndrome is often mild and goes away within a few weeks. However, sometimes the condition becomes a long-term (chronic) problem. Symptoms include an aching pain on the side of the head in the area of the TMJ, pain when chewing or biting, and being unable to open your jaw all the way. You may also make a clicking sound when you open your mouth. TMJ syndrome often goes away on its own. If treatment is needed, it may include medicines to relieve pain, reduce inflammation, or relax the muscles. A splint, bite plate, or mouthpiece may also be used to prevent teeth grinding or jaw clenching. This information is not intended to replace advice given to you by your health care provider. Make sure you discuss any questions you have with your health care provider. Document Revised: 11/09/2020 Document Reviewed: 11/09/2020 Elsevier Patient Education  2023 [employer].

## 2022-07-23 NOTE — Telephone Encounter (Signed)
Returned call to [place]. I have scheduled patient to see Doug Sou, PA-C on Tuesday, 07/27/22 at 1:30 pm, arriving at 1:15 pm. Sherron Ales will contact patient with her new appt information. We will keep June f/u as scheduled with Dr. Adela Lank. Sydney verbalized understanding and had no concerns at the end of the call.

## 2022-07-23 NOTE — Assessment & Plan Note (Signed)
-   Ambulatory referral to Oral Maxillofacial Surgery  2. Chronic generalized abdominal pain  - POCT URINALYSIS DIP (CLINITEK)  3. Type 2 diabetes mellitus with hyperglycemia, without long-term current use of insulin  - Microalbumin/Creatinine Ratio, Urine - Ambulatory referral to Nephrology  4. Proteinuria, unspecified type  - Ambulatory referral to Nephrology  5. Hematuria, unspecified type  - Ambulatory referral to Nephrology  Follow up:  Follow up in 3 months

## 2022-07-26 ENCOUNTER — Encounter (INDEPENDENT_AMBULATORY_CARE_PROVIDER_SITE_OTHER): Payer: Self-pay | Admitting: Adult Health

## 2022-07-26 ENCOUNTER — Ambulatory Visit (INDEPENDENT_AMBULATORY_CARE_PROVIDER_SITE_OTHER): Payer: Medicaid Other | Admitting: Adult Health

## 2022-07-26 VITALS — BP 105/72 | HR 90 | Temp 99.0°F | Ht <= 58 in | Wt 126.0 lb

## 2022-07-26 DIAGNOSIS — E669 Obesity, unspecified: Secondary | ICD-10-CM | POA: Diagnosis not present

## 2022-07-26 DIAGNOSIS — Z7985 Long-term (current) use of injectable non-insulin antidiabetic drugs: Secondary | ICD-10-CM | POA: Diagnosis not present

## 2022-07-26 DIAGNOSIS — E559 Vitamin D deficiency, unspecified: Secondary | ICD-10-CM | POA: Diagnosis not present

## 2022-07-26 DIAGNOSIS — E0859 Diabetes mellitus due to underlying condition with other circulatory complications: Secondary | ICD-10-CM | POA: Diagnosis not present

## 2022-07-26 DIAGNOSIS — Z6826 Body mass index (BMI) 26.0-26.9, adult: Secondary | ICD-10-CM | POA: Diagnosis not present

## 2022-07-26 DIAGNOSIS — I152 Hypertension secondary to endocrine disorders: Secondary | ICD-10-CM | POA: Diagnosis not present

## 2022-07-26 DIAGNOSIS — N1832 Chronic kidney disease, stage 3b: Secondary | ICD-10-CM

## 2022-07-26 DIAGNOSIS — E1159 Type 2 diabetes mellitus with other circulatory complications: Secondary | ICD-10-CM

## 2022-07-26 DIAGNOSIS — E0822 Diabetes mellitus due to underlying condition with diabetic chronic kidney disease: Secondary | ICD-10-CM

## 2022-07-26 NOTE — Progress Notes (Addendum)
WEIGHT SUMMARY AND BIOMETRICS  Vitals Temp: 99 F (37.2 C) BP: 105/72 Pulse Rate: 90 SpO2: 100 %   Anthropometric Measurements Height: 4\' 10"  (1.473 m) Weight: 126 lb (57.2 kg) BMI (Calculated): 26.34 Weight at Last Visit: 126lb Weight Lost Since Last Visit: 0 Weight Gained Since Last Visit: 0 Starting Weight: 152lb Total Weight Loss (lbs): 26 lb (11.8 kg)   Body Composition  Body Fat %: 38.1 % Fat Mass (lbs): 48.2 lbs Muscle Mass (lbs): 74.2 lbs Total Body Water (lbs): 55.4 lbs Visceral Fat Rating : 7   Other Clinical Data Fasting: Yes Labs: No Today's Visit #: 35 Starting Date: 01/10/20    Chief Complaint:   OBESITY Betty Cain is here to discuss her progress with her obesity treatment plan. She is on the the Category 2 Plan and states she is following her eating plan approximately 60 % of the time.  She states she is not currently exercising as Ramadan as recently ended.   Interim History:  [name] recently completed Ramadan and continued to experience fatigue from the required fasting- which is why she has not been exercising. She plans on resuming regular walking, once she increased meals/eating.  Labs were completed at [facility] and with her PCP- both demonstrated new onset CKD.  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 Vitamin D deficiency Discussed Labs  Latest Reference Range & Units 06/21/22 11:50  Vitamin D, 25-Hydroxy 30.0 - 100.0 ng/mL 51.2  Level stable  2. Hypertension associated with diabetes Discussed Labs 06/21/2022 CMP- Electrolytes stable  Latest Reference Range & Units 04/09/22 10:41 06/21/22 11:50 07/08/22 10:29  BUN/Creatinine Ratio 9 - 23  36 (H) 30 (H) 32 (H)  (H): Data is abnormally high CKD new onset since Dec 2023 Per pt- family hx of  Renal Disease, CVD PCP recently referred her Nephroloy She is currently on: ivabradine (CORLANOR) 5 MG TABS tablet  rosuvastatin  (CRESTOR) 5 MG tablet  cloNIDine (CATAPRES) 0.1 MG tablet  bisoprolol (ZEBETA) 5 MG tablet  Semaglutide,0.25 or 0.5MG /DOS, (OZEMPIC, 0.25 OR 0.5 MG/DOSE,) 2 MG/1.5ML SOPN    3. 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 She has chronic constipation. She estimates 2-3 BMs per week. Lab Results  Component Value Date   HGBA1C 5.2 06/21/2022   HGBA1C 5.1 04/09/2022   HGBA1C 4.9 12/28/2021    CKD new onset since Dec 2023 Per pt- family hx of  Renal Disease, CVD PCP recently referred her Nephroloy  Assessment/Plan:   1. Vitamin D deficiency Continue current supplementation  2. Hypertension associated with diabetes AVOID NEPHROTOXIC SUBSTANCES Went over MAR in depth and identified which medications were harmful Continue: ivabradine (CORLANOR) 5 MG TABS tablet  rosuvastatin (CRESTOR) 5 MG tablet  cloNIDine (CATAPRES) 0.1 MG tablet  bisoprolol (ZEBETA) 5 MG tablet  Semaglutide,0.25 or 0.5MG /DOS, (OZEMPIC, 0.25 OR 0.5 MG/DOSE,) 2 MG/1.5ML SOPN   3. 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 Obesity, current BMI 26.3  Betty Cain is currently in the action stage of change. As such, her goal is to continue with  weight loss efforts. She has agreed to the Category 2 Plan.   Exercise goals: All adults should avoid inactivity. Some physical activity is better than none, and adults who participate in any amount of physical activity gain some health benefits.  Behavioral modification strategies: increasing lean protein intake, decreasing  simple carbohydrates, increasing vegetables, increasing water intake, and planning for success.  [name] has agreed to follow-up with our clinic in 4 weeks. She was informed of the importance of frequent follow-up visits to maximize her success with intensive lifestyle modifications for her multiple health conditions.   Objective:   Blood pressure 105/72, pulse 90, temperature 99 F (37.2 C), height 4\' 10"  (1.473 m), weight 126 lb (57.2 kg), SpO2 100 %. Body mass index is 26.33 kg/m.  General: Cooperative, alert, well developed, in no acute distress. HEENT: Conjunctivae and lids unremarkable. Cardiovascular: Regular rhythm.  Lungs: Normal work of breathing. Neurologic: No focal deficits.   Lab Results  Component Value Date   CREATININE 0.60 07/08/2022   BUN 19 07/08/2022   NA 138 07/08/2022   K 4.5 07/08/2022   CL 103 07/08/2022   CO2 20 07/08/2022   Lab Results  Component Value Date   ALT 9 07/08/2022   AST 11 07/08/2022   ALKPHOS 69 07/08/2022   BILITOT 0.3 07/08/2022   Lab Results  Component Value Date   HGBA1C 5.2 06/21/2022   HGBA1C 5.1 04/09/2022   HGBA1C 4.9 12/28/2021   HGBA1C 5.0 08/19/2021   HGBA1C 5.0 08/19/2021   HGBA1C 5.0 (A) 08/19/2021   HGBA1C 5.0 08/19/2021   Lab Results  Component Value Date   INSULIN 7.9 06/21/2022   INSULIN 3.8 12/28/2021   INSULIN 8.7 01/10/2020   Lab Results  Component Value Date   TSH 2.530 12/28/2021   Lab Results  Component Value Date   CHOL 117 04/09/2022   HDL 44 04/09/2022   LDLCALC 58 04/09/2022   TRIG 75 04/09/2022   CHOLHDL 2.7 04/09/2022   Lab Results  Component Value Date   VD25OH 51.2 06/21/2022   VD25OH 35.6 12/28/2021   VD25OH 22.7 (L) 10/09/2021   Lab Results  Component Value Date   WBC 8.6 07/08/2022   HGB 11.4 07/08/2022   HCT 34.2 07/08/2022   MCV 92 07/08/2022   PLT 335 07/08/2022   Lab Results  Component Value Date   IRON 83 05/19/2021   TIBC 230 (L) 05/19/2021   FERRITIN 478 (H)  05/19/2021   Attestation Statements:   Reviewed by clinician on day of visit: allergies, medications, problem list, medical history, surgical history, family history, social history, and previous encounter notes.  I have reviewed the above documentation for accuracy and completeness, and I agree with the above. -  Ranveer Wahlstrom d. Catalino Plascencia, NP-C

## 2022-07-27 ENCOUNTER — Ambulatory Visit: Payer: Medicaid Other | Admitting: Gastroenterology

## 2022-07-27 ENCOUNTER — Encounter: Payer: Self-pay | Admitting: Gastroenterology

## 2022-07-27 VITALS — BP 118/62 | HR 93 | Ht <= 58 in | Wt 129.0 lb

## 2022-07-27 DIAGNOSIS — R1084 Generalized abdominal pain: Secondary | ICD-10-CM | POA: Diagnosis not present

## 2022-07-27 DIAGNOSIS — K5900 Constipation, unspecified: Secondary | ICD-10-CM

## 2022-07-27 LAB — MICROALBUMIN / CREATININE URINE RATIO
Creatinine, Urine: 144.1 mg/dL
Microalb/Creat Ratio: 433 mg/g creat — ABNORMAL HIGH (ref 0–29)
Microalbumin, Urine: 623.9 ug/mL

## 2022-07-27 NOTE — Progress Notes (Signed)
07/27/2022 Betty Cain [id number] 1975-09-12   HISTORY OF PRESENT ILLNESS: This is a 47 year old female is a patient of Dr. Lanetta Inch.  She has a history of GERD and constipation.  She was last seen here by Dr. Adela Lank in the office on 2/30/2023.  Subsequently had an EGD.  Is up-to-date with colonoscopy as the last was in 11/2018.  She is here today with complaints of generalized abdominal pain.  She says that this started during fasting for Ramadan.  Pain is diffuse on her abdomen.  She keeps telling me that her constipation is lifelong.  She uses MiraLAX on occasion.  PCP recently prescribed some stool softeners in the form of Colace.  Says that she has maybe 2 or 3 bowel movements a week, but it is often described as hard balls of stool.  She is on pantoprazole 40 mg twice daily for her acid reflux.  She had tried Trulance in the past for constipation but was too expensive.  She had an ultrasound of the right upper quadrant performed that was unrevealing.  CBC and CMP unremarkable.  She is primarily Arabic speaking so an interpreter/translator was present during the entirety of the visit.   Past Medical History:  Diagnosis Date   Allergy    Anemia    Anxiety 01/2019   Asthma    B12 deficiency    Back pain    Common migraine with intractable migraine 06/28/2017   Constipation    Diabetes 02/2019   Fatigue    GERD (gastroesophageal reflux disease)    pos H pylori   Hypertension    controlled with medication   IBS (irritable bowel syndrome) 2005   Joint pain    Neonatal death    Vaginal delivery, full term-lived x2 hours.    Palpitations    Shortness of breath    Shortness of breath on exertion    Spinal headache    Swelling of both lower extremities    Valvular heart disease    Vitamin D deficiency 02/2019   Past Surgical History:  Procedure Laterality Date   ADENOIDECTOMY     and tonsils (as a child)   BIOPSY  08/27/2021   Procedure: BIOPSY;  Surgeon: Benancio Deeds, MD;  Location: [facility];  Service: Gastroenterology;;   boil  2003   right elbow   CESAREAN SECTION     x4   CESAREAN SECTION  06/02/2011   Procedure: CESAREAN SECTION;  Surgeon: Tilda Burrow, MD;  Location: [facility];  Service: Gynecology;  Laterality: N/A;  Primary Cesarean Section Delivery Baby Boy @ 0004, Apgars 9/9   CESAREAN SECTION N/A 12/10/2013   Procedure: REPEAT CESAREAN SECTION;  Surgeon: Catalina Antigua, MD;  Location: [facility];  Service: Obstetrics;  Laterality: N/A;   CESAREAN SECTION     colonoscopy  11/23/2018   stated had issues with sleep when in for procedure   ESOPHAGOGASTRODUODENOSCOPY (EGD) WITH PROPOFOL N/A 08/27/2021   Procedure: ESOPHAGOGASTRODUODENOSCOPY (EGD) WITH PROPOFOL;  Surgeon: Benancio Deeds, MD;  Location: [facility];  Service: Gastroenterology;  Laterality: N/A;   OTHER SURGICAL HISTORY  2005   Uterine surgery    uterine cauterization      reports that she has never smoked. She has never used smokeless tobacco. She reports that she does not drink alcohol and does not use drugs. family history includes Diabetes in her father, mother, and sister; Migraines in her daughter and sister. Allergies  Allergen Reactions   Topamax [Topiramate]  Itching and Rash      Outpatient Encounter Medications as of 07/27/2022  Medication Sig   Accu-Chek Softclix Lancets lancets USE UP TO FOUR TIMES DAILY AS DIRECTED   acetaminophen (TYLENOL) 500 MG tablet Take 500 mg by mouth every 6 (six) hours as needed.   blood glucose meter kit and supplies KIT Dispense based on patient and insurance preference. Use up to four times daily as directed.   Blood Pressure Monitoring DEVI 1 each by Does not apply route daily.   cloNIDine (CATAPRES) 0.1 MG tablet Take 1 tablet (0.1 mg total) by mouth 2 (two) times daily.   docusate sodium (COLACE) 100 MG capsule Take 1 capsule (100 mg total) by mouth 2 (two) times daily.   fexofenadine (ALLEGRA ALLERGY) 180 MG tablet Take  1 tablet (180 mg total) by mouth daily.   fluticasone (FLONASE) 50 MCG/ACT nasal spray PLACE 2 SPRAYS INTO BOTH NOSTRILS DAILY.   Fremanezumab-vfrm (AJOVY) 225 MG/1.5ML SOAJ Inject 225 mg into the skin every 30 (thirty) days.   gabapentin (NEURONTIN) 100 MG capsule Take 1 capsule (100 mg total) by mouth 3 (three) times daily.   glucose blood (ACCU-CHEK GUIDE) test strip Use as instructed   ivabradine (CORLANOR) 5 MG TABS tablet Take 3 tablets by mouth two hours prior to cardiac CT scan.   meloxicam (MOBIC) 15 MG tablet Take 1 tablet (15 mg total) by mouth daily.   methylcellulose (CITRUCEL) oral powder Take as directed, daily   metoCLOPramide (REGLAN) 5 MG tablet Take 1 tablet (5 mg total) by mouth 3 (three) times daily as needed for nausea. Please schedule a follow up appointment for further refills. Thank you   Multiple Vitamin (MULTI VITAMIN DAILY PO) Take by mouth.   omeprazole (PRILOSEC) 20 MG capsule Take 1 capsule (20 mg total) by mouth daily.   ondansetron (ZOFRAN-ODT) 8 MG disintegrating tablet TAKE 1 TABLET BY MOUTH EVERY 8 HOURS AS NEEDED FOR NAUSEA OR VOMITING.   oxyCODONE-acetaminophen (PERCOCET/ROXICET) 5-325 MG tablet Take 1 tablet by mouth every 6 (six) hours as needed for severe pain.   pantoprazole (PROTONIX) 40 MG tablet Take 1 tablet (40 mg total) by mouth 2 (two) times daily before a meal.   Plecanatide (TRULANCE) 3 MG TABS Take 3 mg by mouth daily.   potassium chloride SA (KLOR-CON M) 20 MEQ tablet Take 1 tablet by mouth once daily   Rimegepant Sulfate 75 MG TBDP Take 1 tab at onset of migraine. May repeat in 2 hrs, if needed. Max dose: 2 tabs/day or 15/month. This is a 90 day rx.   rosuvastatin (CRESTOR) 5 MG tablet TAKE 1 TABLET (5 MG TOTAL) BY MOUTH AT BEDTIME.   Semaglutide,0.25 or 0.5MG /DOS, (OZEMPIC, 0.25 OR 0.5 MG/DOSE,) 2 MG/1.5ML SOPN Inject 0.5 mg into the skin once a week.   SPRINTEC 28 0.25-35 MG-MCG tablet Take 1 tablet by mouth once daily   sucralfate  (CARAFATE) 1 g tablet Take 1 tablet (1 g total) by mouth every 6 (six) hours as needed. Slowly dissolve 1 tablet in 1 tablespoon of distilled water prior to ingestion.   VENTOLIN HFA 108 (90 Base) MCG/ACT inhaler INHALE 2 PUFFS BY MOUTH EVERY 6 HOURS AS NEEDED FOR WHEEZING FOR SHORTNESS OF BREATH   bisoprolol (ZEBETA) 5 MG tablet Take 1 tablet (5 mg total) by mouth 2 (two) times daily. (Patient not taking: Reported on 07/27/2022)   dicyclomine (BENTYL) 10 MG/5ML solution Take 5 mLs (10 mg total) by mouth 4 (four) times daily -  before meals and at bedtime. (Patient not taking: Reported on 07/08/2022)   ferrous sulfate (FEROSUL) 325 (65 FE) MG tablet TAKE 1 TABLET (325 MG TOTAL) BY MOUTH DAILY. (Patient not taking: Reported on 07/27/2022)   [DISCONTINUED] glipiZIDE (GLUCOTROL) 10 MG tablet Take 1 tablet (10 mg total) by mouth 2 (two) times daily before a meal.   No facility-administered encounter medications on file as of 07/27/2022.     REVIEW OF SYSTEMS  : All other systems reviewed and negative except where noted in the History of Present Illness.   PHYSICAL EXAM: BP 118/62   Pulse 93   Ht 4\' 10"  (1.473 m)   Wt 129 lb (58.5 kg)   BMI 26.96 kg/m  General: Well developed female in no acute distress Head: Normocephalic and atraumatic Eyes:  Sclerae anicteric, conjunctiva pink. Ears: Normal auditory acuity Lungs: Clear throughout to auscultation; no W/R/R. Heart: Regular rate and rhythm; no M/R/G. Abdomen: Soft, non-distended.  BS present  Diffuse TTP. Musculoskeletal: Symmetrical with no gross deformities  Skin: No lesions on visible extremities Extremities: No edema  Neurological: Alert oriented x 4, grossly non-focal Psychological:  Alert and cooperative. Normal mood and affect  ASSESSMENT AND PLAN: *Generalized abdominal pain:  ?  Related to constipation.  Constipation has been lifelong, likely worsened by medications, not drinking enough water, hormones with aging, etc.  Her pain  actually started during fasting for Ramadan.  It is somewhat improved currently, but only about 25% improved.  She had a recent ultrasound and labs were unrevealing.  She's had EGD and is up-to-date with colonoscopy.  In 2019 she had a CT scan for complaints of diffuse abdominal pain as well and was unrevealing.  We discussed CT scan.  Will hold off for now.  I am going to have her do a MiraLAX bowel purge and then begin taking the MiraLAX daily as she does not use anything regularly for her bowels at this time.  She will message [place] with an update in a few days.  If symptoms do not improve then we will consider repeating CT scan.  **Of note, it appears that she needs any procedures performed in the hospital setting due to history of laryngospasm.   CC:  Ivonne Andrew, NP

## 2022-07-27 NOTE — Progress Notes (Signed)
Agree with assessment / plan as outlined.  

## 2022-07-27 NOTE — Patient Instructions (Addendum)
????? ???????:  ???? ?????? ??????? ???????? ?? ????? ??????.  ???? ?????? ??? ???? ????:   ????   1 (?????) ????? 119 ???? ?? Miralax  ?? ????? ????? ????? (?????) ??? 32 ????? ?? ????? ???????  ?????:   ?? ???? ????? Miralax ??????? ?? 32 ????? ?? ??????? ?????? ??? ???? ??????.  ???? ????? ???????? ???? ??? ???????. ??? ???? ??? ?????? ???? 2-3 ????? ???????.  ??? ?? ????? ??????? ???? 1 ??? 6 ????? ??? ???????? ?? ????? ????? ???????.  ???? ?????? ???? ????? ?? Miralax ?????? ?? 8 ?????? ?? ??????? ??? ???????? ?? ????? ???????.  ???? ????? Mychart ????? ??????? ?? ??? ???? ?? ??? ???????/????? ??????? ??????.  tathir al'amea'i: 'uwasiy bitathir al'amea' lilmusaeadat fi tanzif 'ameayika. dalil altasawuq dun wasfat tibiyatin:  shira' 1 (wahidatun) zujajat 119 jiram min Miralax  qum bishira' zujajat wahida (wahidatin) sieat 32 'uwnisat min mashrub jaturid khatawati:  qum bikhalt zujajat Miralax bi'akmaliha fi 32 'uwnisat min jaturid waharakiha hataa tadhub tmaman.  aishrab mahlul miralaks aladhi qumt bitahdirihi. sawf tashrab hadha alkhalit khilal 2-3 saeat alqadimati.  yajib 'an tatawaqae alnatayij khilal 1 'iilaa 6 saeat baed alaintiha' min eamaliat tathir al'amea'i. aibda bitanawul jureat wahidat min Miralax ywmyan fi 8 'uwnusat min alsaayil, baed alaintiha' min tathir al'amea'i. 'arsil risalatan Mychart tatadaman thdythan fi waqt lahiq min hadha al'usbuea/'awayil al'usbue almuqbila.  __________________________________________________________________________  BOWEL PURGE:   I am recommending a bowel purge to aid in cleaning out your bowels.    OVER THE COUNTER SHOPPING GUIDE:   Purchase 1 (one) 119 GRAM bottle of Miralax Purchase 1 (one) 32 ounce bottle of Gatorade   STEPS:   Mix the entire bottle of Miralax in the 32 ounces of Gatorade and stir to dissolve completely. Drink the Miralax solution you have prepared. You will drink this mixture over the next 2-3 hours.   You should expect results within 1 to 6 hours after completing this bowel purge.   Start Miralax 1 capful daily in 8 ounces of liquid, after completing bowel purge.   Send Mychart message with an update later this week/early next week.   _______________________________________________________  If your blood pressure at your visit was 140/90 or greater, please contact your primary care physician to follow up on this.  _______________________________________________________  If you are age 45 or older, your body mass index should be between 23-30. Your Body mass index is 26.96 kg/m. If this is out of the aforementioned range listed, please consider follow up with your Primary Care Provider.  If you are age 45 or younger, your body mass index should be between 19-25. Your Body mass index is 26.96 kg/m. If this is out of the aformentioned range listed, please consider follow up with your Primary Care Provider.   ________________________________________________________  The [facility] providers would like to encourage you to use [facility] to communicate with providers for non-urgent requests or questions.  Due to long hold times on the telephone, sending your provider a message by [facility] may be a faster and more efficient way to get a response.  Please allow 48 business hours for a response.  Please remember that this is for non-urgent requests.  _______________________________________________________

## 2022-07-28 ENCOUNTER — Other Ambulatory Visit: Payer: Self-pay | Admitting: Nurse Practitioner

## 2022-07-28 ENCOUNTER — Ambulatory Visit: Payer: Medicaid Other | Admitting: Physical Therapy

## 2022-07-28 ENCOUNTER — Encounter: Payer: Self-pay | Admitting: Physical Therapy

## 2022-07-28 DIAGNOSIS — R600 Localized edema: Secondary | ICD-10-CM

## 2022-07-28 DIAGNOSIS — M5459 Other low back pain: Secondary | ICD-10-CM

## 2022-07-28 DIAGNOSIS — N289 Disorder of kidney and ureter, unspecified: Secondary | ICD-10-CM

## 2022-07-28 DIAGNOSIS — R42 Dizziness and giddiness: Secondary | ICD-10-CM

## 2022-07-28 DIAGNOSIS — M25511 Pain in right shoulder: Secondary | ICD-10-CM | POA: Diagnosis not present

## 2022-07-28 DIAGNOSIS — M6281 Muscle weakness (generalized): Secondary | ICD-10-CM

## 2022-07-28 DIAGNOSIS — R278 Other lack of coordination: Secondary | ICD-10-CM

## 2022-07-28 DIAGNOSIS — G8929 Other chronic pain: Secondary | ICD-10-CM

## 2022-07-28 NOTE — Therapy (Signed)
OUTPATIENT PHYSICAL THERAPY THORACOLUMBAR TREATMENT AND D/C  PHYSICAL THERAPY DISCHARGE SUMMARY  Visits from Start of Care: 13  Current functional level related to goals / functional outcomes: See below   Remaining deficits: See below   Education / Equipment: See below   Patient agrees to discharge. Patient goals were partially met. Patient is being discharged due to  lack of insurance auth and meeting LTGs.    Patient Name: Betty Cain MRN: [id number] DOB:Jan 03, 1976, 47 y.o., female Today's Date: 07/28/2022  END OF SESSION:  PT End of Session - 07/28/22 0927     Visit Number 13    Date for PT Re-Evaluation 07/20/22    Authorization Type [employer]    Authorization Time Period auth request pending for more visits on 07/19/22    Authorization - Number of Visits 5    PT Start Time 0930    PT Stop Time 1010    PT Time Calculation (min) 40 min    Activity Tolerance Patient tolerated treatment well    Behavior During Therapy [facility] for tasks assessed/performed             Past Medical History:  Diagnosis Date   Allergy    Anemia    Anxiety 01/2019   Asthma    B12 deficiency    Back pain    Common migraine with intractable migraine 06/28/2017   Constipation    Diabetes 02/2019   Fatigue    GERD (gastroesophageal reflux disease)    pos H pylori   Hypertension    controlled with medication   IBS (irritable bowel syndrome) 2005   Joint pain    Neonatal death    Vaginal delivery, full term-lived x2 hours.    Palpitations    Shortness of breath    Shortness of breath on exertion    Spinal headache    Swelling of both lower extremities    Valvular heart disease    Vitamin D deficiency 02/2019   Past Surgical History:  Procedure Laterality Date   ADENOIDECTOMY     and tonsils (as a child)   BIOPSY  08/27/2021   Procedure: BIOPSY;  Surgeon: Benancio Deeds, MD;  Location: [facility];  Service: Gastroenterology;;   boil  2003   right elbow    CESAREAN SECTION     x4   CESAREAN SECTION  06/02/2011   Procedure: CESAREAN SECTION;  Surgeon: Tilda Burrow, MD;  Location: [facility];  Service: Gynecology;  Laterality: N/A;  Primary Cesarean Section Delivery Baby Boy @ 0004, Apgars 9/9   CESAREAN SECTION N/A 12/10/2013   Procedure: REPEAT CESAREAN SECTION;  Surgeon: Catalina Antigua, MD;  Location: [facility];  Service: Obstetrics;  Laterality: N/A;   CESAREAN SECTION     colonoscopy  11/23/2018   stated had issues with sleep when in for procedure   ESOPHAGOGASTRODUODENOSCOPY (EGD) WITH PROPOFOL N/A 08/27/2021   Procedure: ESOPHAGOGASTRODUODENOSCOPY (EGD) WITH PROPOFOL;  Surgeon: Benancio Deeds, MD;  Location: [facility];  Service: Gastroenterology;  Laterality: N/A;   OTHER SURGICAL HISTORY  2005   Uterine surgery    uterine cauterization     Patient Active Problem List   Diagnosis Date Noted   Dislocation of jaw 07/23/2022   Small vessel disease 06/29/2022   Type 2 diabetes mellitus with obesity 03/25/2022   Hypotension - iatrogenic 03/18/2022   Other chronic pain 02/02/2022   Dyspepsia    Early satiety    IDA (iron deficiency anemia) 01/29/2021   Paroxysmal  supraventricular tachycardia 10/01/2020   Class 1 obesity with serious comorbidity and body mass index (BMI) of 31.0 to 31.9 in adult 10/01/2020   Diabetes mellitus 02/14/2020   Mixed diabetic hyperlipidemia associated with type 2 diabetes mellitus 01/10/2020   Hypertension associated with diabetes 01/10/2020   Diabetic neuropathy 01/10/2020   Vitamin D deficiency 01/10/2020   Diabetes mellitus with proteinuria 08/20/2019   History of Helicobacter pylori infection 08/08/2019   Proteinuria 06/14/2019   Elevated sed rate 05/23/2019   Elevated C-reactive protein (CRP) 05/23/2019   Diabetes mellitus without complication 05/23/2019   Dyslipidemia (high LDL; low HDL) 05/23/2019   Type 2 diabetes mellitus with hyperglycemia, without long-term current use of insulin 02/17/2019    Hemoglobin A1C between 7% and 9% indicating borderline diabetic control 02/17/2019   Breast cancer screening by mammogram 02/17/2019   Frequent headaches 02/14/2019   Anxiety 02/14/2019   Common migraine with intractable migraine 06/28/2017   Chest pain at rest 10/17/2016   Sinus tachycardia 08/18/2016   Prolonged Q-T interval on ECG 08/18/2016   Vertigo 07/22/2015   Abdominal discomfort 02/14/2015   Low back pain 02/14/2015   Abdominal pain 02/01/2015   Nausea without vomiting 02/01/2015   Environmental allergies 02/01/2015   Language barrier to communication 02/01/2015   Anemia 01/23/2015   Constipation 01/21/2015   Knee pain, bilateral 01/21/2015   Palpitations 09/18/2013   Shortness of breath 09/18/2013   Advanced maternal age in pregnancy in second trimester 06/19/2013   IBS (irritable bowel syndrome) 04/13/2003    PCP: Ivonne Andrew, NP  REFERRING PROVIDER: Ivonne Andrew, NP  REFERRING DIAG:  [phone] (ICD-10-CM) - Chronic right shoulder pain  M54.9,G89.29 (ICD-10-CM) - Chronic mid back pain  R42 (ICD-10-CM) - Vertigo    Rationale for Evaluation and Treatment: Rehabilitation  THERAPY DIAG:  Dizziness and giddiness  Chronic right shoulder pain  Other lack of coordination  Muscle weakness (generalized)  Other low back pain  Localized edema  ONSET DATE: 04/09/22  SUBJECTIVE:                                                                                                                                                                                          In-person interpreter  SUBJECTIVE STATEMENT: Pt states the eye exercises are hard reports pain. Pain in neck and dizziness have been better. Pt states she is no longer feeling dizziness all the time while in the car. The bad dizziness when she first wakes up has imkproved.   PERTINENT HISTORY:  Patient presents today for a follow-up visit.  Patient does complain of right ear pain with some  dizziness x 1  month.  Upon exam ears appear normal.  Patient does also complain of ongoing right shoulder pain and mid back pain.  This has been a chronic issue.  We will get x-rays today.  Will refer patient for physical therapy. Denies f/c/s, n/v/d, hemoptysis, PND, leg swelling  PAIN:  Are you having pain? Yes: NPRS scale: 5/10 Pain location: neck Pain description: "tense, pulling"  Aggravating factors: housework Relieving factors: repeated movement  PRECAUTIONS: None  WEIGHT BEARING RESTRICTIONS: No  FALLS:  Has patient fallen in last 6 months? No  LIVING ENVIRONMENT: Lives with: lives with their family Lives in: House/apartment Stairs: No Has following equipment at home: None  OCCUPATION: N/A  PLOF: Independent  PATIENT GOALS: Feel better   OBJECTIVE:   DIAGNOSTIC FINDINGS:  EXAM: RIGHT SHOULDER - 3 VIEWFINDINGS: There is no evidence of fracture or dislocation. There is no evidence of arthropathy or other focal bone abnormality. Soft tissues are unremarkable. THORACIC SPINE 3 VIEWS FINDINGS: There is no evidence of thoracic spine fracture. Alignment is normal. No other significant bone abnormalities are identified.   SENSATION: Patient reports tingling and numbness in her toes, B.  MUSCLE LENGTH: Hamstrings: Right 75 deg; Left 65 deg   POSTURE: rounded shoulders, forward head, and flexed trunk   PALPATION: Tight and TTP on B cerv paraspinals, B up traps, R medial parascapular muscles, Ant cervical muscles, R > L, B pects, lower T spine and upper lumbar paraspinals  UE ROM: R shoulder P flex to 105, abd to 85 before it becomes painful  UPPER EXTREMITY ROM:  Active ROM Right 06/24/22 Left eval  Shoulder flexion Full pn    Shoulder extension    Shoulder abduction Full pn   Shoulder adduction    Shoulder extension    Shoulder internal rotation L5 pn   Shoulder external rotation T3 pn   Elbow flexion    Elbow extension    Wrist flexion    Wrist  extension    Wrist ulnar deviation    Wrist radial deviation    Wrist pronation    Wrist supination     (Blank rows = not tested)  UE STRENGTH: R shoulder pain limits MMT  UPPER EXTREMITY MMT:  MMT Right 07/06/22 Right 07/28/22  Shoulder flexion  4  Shoulder abduction  4 pain  Shoulder adduction    Shoulder extension  4  Shoulder internal rotation  4  Shoulder external rotation  3+  Middle trapezius    Lower trapezius    Elbow flexion  4  Elbow extension  3+  Wrist flexion 4   Wrist extension 4   Wrist ulnar deviation    Wrist radial deviation    Wrist pronation    Wrist supination    Grip strength     (Blank rows = not tested)   UE SPECIAL TESTS: R Hawkins, Neer, empty can test all (+), belly press test (-)  CERVICAL ROM: Limited by pain in all directions, mostly ext and B lateral flex   Active ROM A/PROM 06/24/22 A/RPOM 4/17  Flexion WNL WNL  Extension WNL increased RUE pain WNL "a stretch"  Right lateral flexion WNL WNL pain  Left lateral flexion WNL WNL  Right rotation WNL pain Rt upper trap WNL  Left rotation WNL pain Rt upper trap  WNL   (Blank rows = not tested)  06/24/22:   THORACIC MOBILITY: Limited due to pain.  LOWER EXTREMITY ROM: Mild tightness noted in R knee to chest  LOWER EXTREMITY STRENGTH:  B hips and knee 3+/5  FUNCTIONAL ASSESSMENT: Quick DASH 71%  VESTIBULAR ASSESSMENT: 05/12/22 Vestibular assessment- Observation- eyes aligned, tracking WNL Saccades- increased dizziness reported after 6 reps, Convergence-Dizziness reported at 15" VOR-(+) horizontal, (-) vertical VOR cancellation (-) horizontal, deferred Vert due to fatigue.   Orthostatic BPs Supine- 99/79 HR 81BPM Sitting- 117/86 HR 82 BPM, 3 minutes- 102/85 HR81 Stand- 98/82 HR 84, 3 minutes- 102/86 HR 90   BPPV Dix-Hallpike (-) R, (+) L Performed L Epley maneuver with residual dizziness upon sitting.   07/19/22: Dix-Hallpike (-) R & L  GAIT: Distance walked: In clinic  distances Assistive device utilized: None Level of assistance: Complete Independence Comments: [facility]  [facility] Adult PT Treatment:                                                DATE: 07/28/22 Therapeutic Exercise: Cervical retraction standing x 10 Shoulder ER red TB 2x10 "W" red TB 2x10 Shoulder horizontal abd red TB 2x10 Neuromuscular re-ed: Horizontal and then vertical saccades 2x30 sec Therapeutic Activity: Rechecked MMT and ROM Self Care: Self massage with tennis ball Progressions with HEP   [facility] Adult PT Treatment:                                                DATE: 07/19/22 Therapeutic Exercise: UBE L1 x 5 min fwd/bwd Supine cervical retraction with overpressure x 10 Cervical lateral flexion iso 10x5 sec Cervical retraction + rotation iso 5x5 sec Shoulder ER red TB 2x10 "W" 2x10 Manual Therapy: STM & TPR cervical paraspinals, UTs, suboccipitals Pin and stretch UT Neuromuscular re-ed: VOR horizontal and vertical x 1 sitting 80 bpm 2 trials each; 30 seconds horizonta & vertical  Saccades 2x30 sec horizontal and vertical Smooth pursuit 2x30 sec horizontal and then vertical    [facility] Adult PT Treatment:                                                DATE: 07/12/22 Therapeutic Exercise: UBE level 1 x 5 minutes fwd  Seated cervical retraction with overpressure x 10  Supine cervical retraction with overpressure x 10  Seated horizontal shoulder abduction red band 2 x 10  VOR horizontal and vertical x 1 sitting 80 bpm 2 trials each; 20 seconds horizontal, 1 minute vertical  Standing shoulder flexion 2 x 10; 1#  HEP review    [facility] Adult PT Treatment:                                                DATE: 07/06/22 Therapeutic Exercise: Supine cervical retraction x 10  Standing resisted shoulder extension yellow band 2 x 10  Standing resisted rows 2 x 10; red band  Seated cervical retraction with overpressure x 10  Resisted wrist flexion 2 x 10; 1# Resisted wrist extension 2 x 10;  1#  Bicep curl 2 x 10; 1# Reviewed HEP  Manual Therapy: Manual cervical distraction    [facility]  Adult PT Treatment:                                                DATE: 07/02/22 Therapeutic Exercise: Supine cervical retraction x 10  Seated cervical retraction with overpressure 2 x 10  Supine horizontal shoulder abduction 2 x 8; yellow band Putty gripping x 10  Seated bicep curls 2 x 10; yellow band  Seated tricep extension 2 x 10; yellow band  Seated shoulder ER 2 x 10; yellow band  Updated HEP    [facility] Adult PT Treatment:                                                DATE: 06/24/22 Therapeutic Exercise: Seated cervical retraction x  10  Supine cervical retraction 2 x 10  Seated scapular retraction 2 x 10  Pec doorway stretch 2 x 30 sec  Updated HEP  Manual Therapy: Gentle cervical distraction STM/trigger point release Rt upper trap/levator scapulae    06/04/22 UBE L1 x41mins  Seated rows and lat pull downs 10# 2x10  QuickDash- 61.4% Brandt-Daroff exercises x3 Horizontal abd red 2x10 Ball roll up wall x10   05/28/22 Horizontal canal testing- positive for dizziness on R side  BBQ roll  VOR x1 standing 80 bps, then 100bps VOR x2 standing 80 bps Shoulder flexion 2# x10 Shoulder abd 2# x10  Rows and ext red 2x10 MH to R shoulder and upper trap 10 mins   05/19/22 L Epley- eyelids fluttered, and dizziness with each position STM to R supraspinatus, long head of biceps, pects, F/B stretch and humeral mobs in inf and post glide Shoulder retraction/depression in sitting with hands behind hips x 10 Scap protraction against G tband x 10, wall slides into flex x 10-painful VOR x 1 no dizziness hor or vert VOR x 2 dizziness reported   05/14/22 L Epley- negative but dizziness when sitting back up, goes away <20s  Saccades looking back and forth 2 targets- dizziness with vertical movements after stopping VOR x1 horizontal and vertical 20 reps- a little dizzy after looking  vertically VOR x2 dizzy after 6 reps horizontal but second rep able to do 20 VOR x2 dizzy after 10 vertical reps, then does 20 but dizzy looking up STM with gun to bilateral UT  Manual cervical traction and STM to suboccipitals  Supine shoulder flexion 2# 2x10 Chest press 2# 2x10 PROM to R shoulder all directions and lateral distraction   PATIENT EDUCATION:  Education details: HEP Person educated: Patient and interpreter Education method: Explanation, Demonstration, Tactile cues, and Verbal cues Education comprehension: verbalized understanding, returned demonstration, verbal cues required, and tactile cues required  HOME EXERCISE PROGRAM: Access Code: [email or web address] URL: [email or web address] Date: 06/24/2022 Prepared by: Letitia Libra  Exercises - Seated Scapular Retraction  - 2 x daily - 7 x weekly - 2 reps - 10 hold - Supine Cervical Retraction with Towel  - 2 x daily - 7 x weekly - 2 sets - 10 reps - Doorway Pec Stretch at 60 Elevation  - 1 x daily - 7 x weekly - 3 reps - 30 sec  hold - Seated Gaze Stabilization with Head Rotation and Horizontal Arm Movement  - 1 x daily -  7 x weekly - 2 sets - 20 reps - Seated Gaze Stabilization with Head Nod and Vertical Arm Movement  - 1 x daily - 7 x weekly - 2 sets - 20 reps  ASSESSMENT:  CLINICAL IMPRESSION: Pt has met most of her LTGs at this time. Discussed PT d/c today since she no longer has insurance auth. Pt's greatest deficit is her R UE weakness with R side neck pain while performing her ADLs and IADLs. Dizziness is now only occasional with car rides and no longer present when she first gets up in the morning. Reviewed and finalized HEP with good pt understanding.   OBJECTIVE IMPAIRMENTS: decreased activity tolerance, decreased coordination, decreased mobility, decreased ROM, decreased strength, increased muscle spasms, impaired flexibility, impaired UE functional use, improper body mechanics, postural dysfunction, and  pain.   ACTIVITY LIMITATIONS: carrying, lifting, bending, sitting, squatting, sleeping, bathing, dressing, reach over head, hygiene/grooming, and locomotion level  PARTICIPATION LIMITATIONS: meal prep, cleaning, laundry, and shopping  PERSONAL FACTORS: Past/current experiences are also affecting patient's functional outcome.   REHAB POTENTIAL: Good  CLINICAL DECISION MAKING: Evolving/moderate complexity  EVALUATION COMPLEXITY: Moderate   GOALS: Goals reviewed with patient? Yes  SHORT TERM GOALS: Target date: 04/27/22  I with initial HEP Baseline: Goal status: met  LONG TERM GOALS: Target date: 07/20/22  I with final HEP Baseline:  Goal status: 07/28/22: MET  2.  Patient will be able to perform her normal daily activities including housekeeping, cooking, self car, with R shoulder pain < 4/10 Baseline:  05/19/22- ongoing pain 7/10 07/19/22 - ongoing, pain is 5/10 Goal status: 07/28/22: NOT MET 6/10 pain with overhead lifting and cooking   3.  Increase R shoulder strength to at least 4/5 Baseline:  07/02/22 - ongoing, using yellow TB 07/19/22 - tolerates 1# weight and red TB Goal status: 07/28/22: NOT MET See MMT above  4.  Increase BLE strength to at least 4/5 Baseline:  Goal status: 07/28/22: MET grossly 4/5 BLE  5.  Patient will report improvement in shoulder and back pain by at least 50% Baseline:  07/19/22 - Ongoing, ~30% reduction based on NPS Goal status: 07/28/22: MET reports 50% improvement  6.  Patient will report no dizziness while moving about throughout the day Baseline:  07/19/22 - Ongoing, dizziness only with initially getting up in the morning and riding in the car now Goal status: 07/28/22 MET  PLAN:  PT FREQUENCY: 2x/week  PT DURATION: 12 weeks  PLANNED INTERVENTIONS: Therapeutic exercises, Therapeutic activity, Neuromuscular re-education, Balance training, Gait training, Patient/Family education, Self Care, Joint mobilization, Vestibular training, Canalith  repositioning, Dry Needling, Electrical stimulation, Spinal mobilization, Cryotherapy, Moist heat, Taping, Vasopneumatic device, Traction, Ionotophoresis 4mg /ml Dexamethasone, and Manual therapy.  PLAN FOR NEXT SESSION:  d/c  Rodneshia Greenhouse April Dell Ponto, PT 07/28/22 9:27 AM

## 2022-07-28 NOTE — Progress Notes (Signed)
Called pt and inform message. Gh 

## 2022-08-05 ENCOUNTER — Other Ambulatory Visit: Payer: Self-pay | Admitting: Nurse Practitioner

## 2022-08-05 ENCOUNTER — Ambulatory Visit: Payer: Self-pay | Admitting: Nurse Practitioner

## 2022-08-05 ENCOUNTER — Encounter (INDEPENDENT_AMBULATORY_CARE_PROVIDER_SITE_OTHER): Payer: Self-pay | Admitting: Adult Health

## 2022-08-20 ENCOUNTER — Other Ambulatory Visit (INDEPENDENT_AMBULATORY_CARE_PROVIDER_SITE_OTHER): Payer: Self-pay | Admitting: Adult Health

## 2022-08-20 DIAGNOSIS — E1169 Type 2 diabetes mellitus with other specified complication: Secondary | ICD-10-CM

## 2022-08-23 ENCOUNTER — Ambulatory Visit (INDEPENDENT_AMBULATORY_CARE_PROVIDER_SITE_OTHER): Payer: Medicaid Other | Admitting: Adult Health

## 2022-08-23 ENCOUNTER — Encounter (INDEPENDENT_AMBULATORY_CARE_PROVIDER_SITE_OTHER): Payer: Self-pay | Admitting: Adult Health

## 2022-08-23 VITALS — BP 97/70 | HR 67 | Temp 98.4°F | Ht <= 58 in | Wt 123.0 lb

## 2022-08-23 DIAGNOSIS — E669 Obesity, unspecified: Secondary | ICD-10-CM | POA: Diagnosis not present

## 2022-08-23 DIAGNOSIS — Z6825 Body mass index (BMI) 25.0-25.9, adult: Secondary | ICD-10-CM

## 2022-08-23 DIAGNOSIS — R632 Polyphagia: Secondary | ICD-10-CM

## 2022-08-23 DIAGNOSIS — K5900 Constipation, unspecified: Secondary | ICD-10-CM

## 2022-08-23 DIAGNOSIS — E1169 Type 2 diabetes mellitus with other specified complication: Secondary | ICD-10-CM

## 2022-08-23 DIAGNOSIS — Z7984 Long term (current) use of oral hypoglycemic drugs: Secondary | ICD-10-CM

## 2022-08-23 MED ORDER — SEMAGLUTIDE (1 MG/DOSE) 4 MG/3ML ~~LOC~~ SOPN: 1.0000 mg | PEN_INJECTOR | SUBCUTANEOUS | 0 refills | Status: DC

## 2022-08-23 NOTE — Progress Notes (Signed)
WEIGHT SUMMARY AND BIOMETRICS  Vitals Temp: 98.4 F (36.9 C) BP: 97/70 Pulse Rate: 67 SpO2: 98 %   Anthropometric Measurements Height: 4\' 10"  (1.473 m) Weight: 123 lb (55.8 kg) BMI (Calculated): 25.71 Weight at Last Visit: 126lb Weight Lost Since Last Visit: 3lb Weight Gained Since Last Visit: 0 Starting Weight: 152lb Total Weight Loss (lbs): 29 lb (13.2 kg)   Body Composition  Body Fat %: 36.6 % Fat Mass (lbs): 45.2 lbs Muscle Mass (lbs): 74.4 lbs Total Body Water (lbs): 54.2 lbs Visceral Fat Rating : 6   Other Clinical Data Fasting: yes Labs: no Today's Visit #: 36 Starting Date: 01/10/20    Chief Complaint:   OBESITY Betty Cain is here to discuss her progress with her obesity treatment plan. She is on the the Category 2 Plan and states she is following her eating plan approximately 75 % of the time. She states she is exercising Walking 30 minutes 2-3 times per week.   Interim History:   Hunger/appetite-She endorses significant cravings the last several weeks. She reports increased protein at snacks- ie Fair Life milk and Pure Protein Bars. Exercise-She is walking 3 days per week at least 30 minutes. Hydration-She drinks either hot tea or water throughout the day.  Of Note- She is primarily Arabic speaking so an interpreter/translator was present during the entirety of the visit.   Subjective:   1. Type 2 diabetes mellitus with obesity (HCC) Lab Results  Component Value Date   HGBA1C 5.2 06/21/2022   HGBA1C 5.1 04/09/2022   HGBA1C 4.9 12/28/2021   She has been on Ozempic therapy since Fall 2022 - varying doses from 0.25mg  to 2mg  Currently on 0.5mg  Denies mass in neck, dysphagia, dyspepsia, persistent hoarseness, abdominal pain, or N/V. Constipation stable. Since completing" MiraLAX bowel purge"  per GI- she is now having daily BM- denies hematochezia or abdominal pain. Discussed cautiously increasing GLP-1 therapy  2. Constipation, unspecified  constipation type 07/27/2022 Gastro OV Notes: HISTORY OF PRESENT ILLNESS: This is a 47 year old female is a patient of Dr. Lanetta Inch.  She has a history of GERD and constipation.  She was last seen here by Dr. Adela Lank in the office on 2/30/2023.  Subsequently had an EGD.  Is up-to-date with colonoscopy as the last was in 11/2018.  She is here today with complaints of generalized abdominal pain.  She says that this started during fasting for Ramadan.  Pain is diffuse on her abdomen.  She keeps telling me that her constipation is lifelong.  She uses MiraLAX on occasion.  PCP recently prescribed some stool softeners in the form of Colace.  Says that she has maybe 2 or 3 bowel movements a week, but it is often described as hard balls of stool.  She is on pantoprazole 40 mg twice daily for her acid reflux.  She had tried Trulance in the past for constipation but was too expensive.  She had an ultrasound of the right upper quadrant performed that was unrevealing.  CBC and CMP unremarkable.  ASSESSMENT AND PLAN: *Generalized abdominal pain:  ?  Related to constipation.  Constipation has been lifelong, likely worsened by medications, not drinking enough water, hormones with aging, etc.  Her pain actually started during fasting for Ramadan.  It is somewhat improved currently, but only about 25% improved.  She had a recent ultrasound and labs were unrevealing.  She's had EGD and is up-to-date with colonoscopy.  In 2019 she had a CT scan for complaints of diffuse  abdominal pain as well and was unrevealing.  We discussed CT scan.  Will hold off for now.  I am going to have her do a MiraLAX bowel purge and then begin taking the MiraLAX daily as she does not use anything regularly for her bowels at this time.  She will message [place] with an update in a few days.  If symptoms do not improve then we will consider repeating CT scan.   **Of note, it appears that she needs any procedures performed in the hospital setting due to  history of laryngospasm.  3. Polyphagia She endorses significant cravings the last several weeks. She reports increased protein at snacks- ie Fair Life milk and Pure Protein Bars. She has been on Ozempic therapy since Fall 2022 - varying doses from 0.25mg  to 2mg  Currently on 0.5mg    Assessment/Plan:   1. Type 2 diabetes mellitus with obesity (HCC) Refill and increase  Semaglutide, 1 MG/DOSE, 4 MG/3ML SOPN Inject 1 mg as directed once a week. 1 mg every 10 days Dispense: 3 mL, Refills: 0 ordered  Do not overate REMAIN WELL HYDRATED Contact [facility] with any GLP-1 SE issues  2. Constipation, unspecified constipation type Remain well hydrated and increase regular walking. ContinueOTC Miralax as needed.  3. Polyphagia Strive for at least 30g protein at each meal and higher protein snacks  4. Obesity, current BMI 25.71  Betty Cain is currently in the action stage of change. As such, her goal is to continue with weight loss efforts. She has agreed to the Category 2 Plan.   Exercise goals: For substantial health benefits, adults should do at least 150 minutes (2 hours and 30 minutes) a week of moderate-intensity, or 75 minutes (1 hour and 15 minutes) a week of vigorous-intensity aerobic physical activity, or an equivalent combination of moderate- and vigorous-intensity aerobic activity. Aerobic activity should be performed in episodes of at least 10 minutes, and preferably, it should be spread throughout the week.  Behavioral modification strategies: increasing lean protein intake, decreasing simple carbohydrates, increasing vegetables, increasing water intake, meal planning and cooking strategies, and planning for success.  Betty Cain has agreed to follow-up with our clinic in 4 weeks. She was informed of the importance of frequent follow-up visits to maximize her success with intensive lifestyle modifications for her multiple health conditions.   Objective:   Blood pressure 97/70, pulse 67,  temperature 98.4 F (36.9 C), height 4\' 10"  (1.473 m), weight 123 lb (55.8 kg), SpO2 98 %. Body mass index is 25.71 kg/m.  General: Cooperative, alert, well developed, in no acute distress. HEENT: Conjunctivae and lids unremarkable. Cardiovascular: Regular rhythm.  Lungs: Normal work of breathing. Neurologic: No focal deficits.   Lab Results  Component Value Date   CREATININE 0.60 07/08/2022   BUN 19 07/08/2022   NA 138 07/08/2022   K 4.5 07/08/2022   CL 103 07/08/2022   CO2 20 07/08/2022   Lab Results  Component Value Date   ALT 9 07/08/2022   AST 11 07/08/2022   ALKPHOS 69 07/08/2022   BILITOT 0.3 07/08/2022   Lab Results  Component Value Date   HGBA1C 5.2 06/21/2022   HGBA1C 5.1 04/09/2022   HGBA1C 4.9 12/28/2021   HGBA1C 5.0 08/19/2021   HGBA1C 5.0 08/19/2021   HGBA1C 5.0 (A) 08/19/2021   HGBA1C 5.0 08/19/2021   Lab Results  Component Value Date   INSULIN 7.9 06/21/2022   INSULIN 3.8 12/28/2021   INSULIN 8.7 01/10/2020   Lab Results  Component Value Date  TSH 2.530 12/28/2021   Lab Results  Component Value Date   CHOL 117 04/09/2022   HDL 44 04/09/2022   LDLCALC 58 04/09/2022   TRIG 75 04/09/2022   CHOLHDL 2.7 04/09/2022   Lab Results  Component Value Date   VD25OH 51.2 06/21/2022   VD25OH 35.6 12/28/2021   VD25OH 22.7 (L) 10/09/2021   Lab Results  Component Value Date   WBC 8.6 07/08/2022   HGB 11.4 07/08/2022   HCT 34.2 07/08/2022   MCV 92 07/08/2022   PLT 335 07/08/2022   Lab Results  Component Value Date   IRON 83 05/19/2021   TIBC 230 (L) 05/19/2021   FERRITIN 478 (H) 05/19/2021   Attestation Statements:   Reviewed by clinician on day of visit: allergies, medications, problem list, medical history, surgical history, family history, social history, and previous encounter notes.  I have reviewed the above documentation for accuracy and completeness, and I agree with the above. -  Olyn Landstrom d. Roarke Marciano, NP-X

## 2022-08-26 DIAGNOSIS — I129 Hypertensive chronic kidney disease with stage 1 through stage 4 chronic kidney disease, or unspecified chronic kidney disease: Secondary | ICD-10-CM | POA: Diagnosis not present

## 2022-08-26 DIAGNOSIS — G8929 Other chronic pain: Secondary | ICD-10-CM | POA: Diagnosis not present

## 2022-08-26 DIAGNOSIS — E119 Type 2 diabetes mellitus without complications: Secondary | ICD-10-CM | POA: Diagnosis not present

## 2022-08-26 DIAGNOSIS — R809 Proteinuria, unspecified: Secondary | ICD-10-CM | POA: Diagnosis not present

## 2022-08-26 DIAGNOSIS — R319 Hematuria, unspecified: Secondary | ICD-10-CM | POA: Diagnosis not present

## 2022-08-28 ENCOUNTER — Other Ambulatory Visit (HOSPITAL_COMMUNITY): Payer: Self-pay

## 2022-08-28 ENCOUNTER — Telehealth: Payer: Self-pay

## 2022-08-28 NOTE — Telephone Encounter (Signed)
Pharmacy Patient Advocate Encounter   Received notification from [facility] that prior authorization for AJOVY (fremanezumab-vfrm) injection 225MG /1.5ML auto-injectors is required/requested.   PA submitted on 08/28/2022 to (ins) [employer] [facility]  via [employer] or ([place] confirmation # [id number] Status is pending

## 2022-08-29 ENCOUNTER — Other Ambulatory Visit (HOSPITAL_COMMUNITY): Payer: Self-pay

## 2022-08-29 NOTE — Telephone Encounter (Signed)
Pharmacy Patient Advocate Encounter  Prior Authorization for Ajovy 225 MG/ML AutoInjector has been approved by [employer] [place] (ins).    PA # Request Number: [id number] Effective dates: 08/28/2022 through 08/28/2023

## 2022-09-11 ENCOUNTER — Other Ambulatory Visit: Payer: Self-pay | Admitting: Nurse Practitioner

## 2022-09-11 ENCOUNTER — Other Ambulatory Visit: Payer: Self-pay | Admitting: Gastroenterology

## 2022-09-11 DIAGNOSIS — J302 Other seasonal allergic rhinitis: Secondary | ICD-10-CM

## 2022-09-20 ENCOUNTER — Ambulatory Visit (INDEPENDENT_AMBULATORY_CARE_PROVIDER_SITE_OTHER): Payer: Medicaid Other | Admitting: Adult Health

## 2022-09-20 ENCOUNTER — Encounter (INDEPENDENT_AMBULATORY_CARE_PROVIDER_SITE_OTHER): Payer: Self-pay | Admitting: Adult Health

## 2022-09-20 VITALS — BP 97/66 | HR 88 | Temp 98.3°F | Ht <= 58 in | Wt 124.0 lb

## 2022-09-20 DIAGNOSIS — E669 Obesity, unspecified: Secondary | ICD-10-CM | POA: Diagnosis not present

## 2022-09-20 DIAGNOSIS — Z7985 Long-term (current) use of injectable non-insulin antidiabetic drugs: Secondary | ICD-10-CM | POA: Diagnosis not present

## 2022-09-20 DIAGNOSIS — Z6825 Body mass index (BMI) 25.0-25.9, adult: Secondary | ICD-10-CM

## 2022-09-20 DIAGNOSIS — R632 Polyphagia: Secondary | ICD-10-CM

## 2022-09-20 DIAGNOSIS — E1169 Type 2 diabetes mellitus with other specified complication: Secondary | ICD-10-CM

## 2022-09-20 MED ORDER — SEMAGLUTIDE (1 MG/DOSE) 4 MG/3ML ~~LOC~~ SOPN: 1.0000 mg | PEN_INJECTOR | SUBCUTANEOUS | 0 refills | Status: DC

## 2022-09-20 NOTE — Progress Notes (Signed)
WEIGHT SUMMARY AND BIOMETRICS  Vitals Temp: 98.3 F (36.8 C) BP: 97/66 Pulse Rate: 88 SpO2: 99 %   Anthropometric Measurements Height: 4\' 10"  (1.473 m) Weight: 124 lb (56.2 kg) BMI (Calculated): 25.92 Weight at Last Visit: 123 lb Weight Lost Since Last Visit: 0 Weight Gained Since Last Visit: 1 lb Starting Weight: 152 lb Total Weight Loss (lbs): 28 lb (12.7 kg)   Body Composition  Body Fat %: 36.7 % Fat Mass (lbs): 45.6 lbs Muscle Mass (lbs): 74.4 lbs Total Body Water (lbs): 55 lbs Visceral Fat Rating : 6   Other Clinical Data Fasting: no Labs: no Today's Visit #: 37 Starting Date: 01/10/20    Chief Complaint:   OBESITY Betty Cain is here to discuss her progress with her obesity treatment plan. She is on the the Category 2 Plan and states she is following her eating plan approximately 65 % of the time. She states she is exercising Walking 30-40 minutes 3-4 times per week.  Interim History:  Betty Cain arrived on time to her scheduled OV. Her interpretor did not arrive until the last 5 mins of her scheduled visit. We utilized the [employer]" during the first 25 minutes of her visit.  Hunger/appetite-appetite well controlled with Ozempic 1mg  injection every 9-10 days  Exercise-she has increased walking to 4 x week  Hydration-she denies any plain water intake, prefers to hydrate with hot tea  Subjective:   1. Type 2 diabetes mellitus with obesity (HCC) Lab Results  Component Value Date   HGBA1C 5.2 06/21/2022   HGBA1C 5.1 04/09/2022   HGBA1C 4.9 12/28/2021   Fasting CBG 100- even almost everyday. She will experience "shakiness" if she does not eat for long periods of time. She is on injection Ozempic 1mg  every 9-10 days. Denies mass in neck, dysphagia, dyspepsia, persistent hoarseness, abdominal pain, or N/V/C   2. Polyphagia She reports stable appetite levels. She is able to consume all prescribed food on Cat 2 Meal Plan  Assessment/Plan:    1. Type 2 diabetes mellitus with obesity (HCC) Refill   Semaglutide, 1 MG/DOSE, 4 MG/3ML SOPN Inject 1 mg as directed once a week. 1 mg every 10 days Dispense: 3 mL, Refills: 0 ordered   2. Polyphagia Continue Cat 2 Meal Plan, regular walking, and Ozempic therapy.  3. Obesity, current BMI 25.9  Betty Cain is currently in the action stage of change. As such, her goal is to maintain weight for now. She has agreed to the Category 2 Plan.   Drink 4 oz water upon waking and at bedtime.  Exercise goals: For substantial health benefits, adults should do at least 150 minutes (2 hours and 30 minutes) a week of moderate-intensity, or 75 minutes (1 hour and 15 minutes) a week of vigorous-intensity aerobic physical activity, or an equivalent combination of moderate- and vigorous-intensity aerobic activity. Aerobic activity should be performed in episodes of at least 10 minutes, and preferably, it should be spread throughout the week.  Behavioral modification strategies: increasing lean protein intake, decreasing simple carbohydrates, increasing vegetables, increasing water intake, no skipping meals, meal planning and cooking strategies, and planning for success.  Betty Cain has agreed to follow-up with our clinic in 4 weeks. She was informed of the importance of frequent follow-up visits to maximize her success with intensive lifestyle modifications for her multiple health conditions.   Objective:   Blood pressure 97/66, pulse 88, temperature 98.3 F (36.8 C), height 4\' 10"  (1.473 m), weight 124 lb (56.2 kg), SpO2  99 %. Body mass index is 25.92 kg/m.  General: Cooperative, alert, well developed, in no acute distress. HEENT: Conjunctivae and lids unremarkable. Cardiovascular: Regular rhythm.  Lungs: Normal work of breathing. Neurologic: No focal deficits.   Lab Results  Component Value Date   CREATININE 0.60 07/08/2022   BUN 19 07/08/2022   NA 138 07/08/2022   K 4.5 07/08/2022   CL 103 07/08/2022    CO2 20 07/08/2022   Lab Results  Component Value Date   ALT 9 07/08/2022   AST 11 07/08/2022   ALKPHOS 69 07/08/2022   BILITOT 0.3 07/08/2022   Lab Results  Component Value Date   HGBA1C 5.2 06/21/2022   HGBA1C 5.1 04/09/2022   HGBA1C 4.9 12/28/2021   HGBA1C 5.0 08/19/2021   HGBA1C 5.0 08/19/2021   HGBA1C 5.0 (A) 08/19/2021   HGBA1C 5.0 08/19/2021   Lab Results  Component Value Date   INSULIN 7.9 06/21/2022   INSULIN 3.8 12/28/2021   INSULIN 8.7 01/10/2020   Lab Results  Component Value Date   TSH 2.530 12/28/2021   Lab Results  Component Value Date   CHOL 117 04/09/2022   HDL 44 04/09/2022   LDLCALC 58 04/09/2022   TRIG 75 04/09/2022   CHOLHDL 2.7 04/09/2022   Lab Results  Component Value Date   VD25OH 51.2 06/21/2022   VD25OH 35.6 12/28/2021   VD25OH 22.7 (L) 10/09/2021   Lab Results  Component Value Date   WBC 8.6 07/08/2022   HGB 11.4 07/08/2022   HCT 34.2 07/08/2022   MCV 92 07/08/2022   PLT 335 07/08/2022   Lab Results  Component Value Date   IRON 83 05/19/2021   TIBC 230 (L) 05/19/2021   FERRITIN 478 (H) 05/19/2021   Attestation Statements:   Reviewed by clinician on day of visit: allergies, medications, problem list, medical history, surgical history, family history, social history, and previous encounter notes.  Used the [employer] and in person interpreter to complete OV.  I have reviewed the above documentation for accuracy and completeness, and I agree with the above. -  Eleora Sutherland d. Diamantina Edinger, NP-C

## 2022-09-23 ENCOUNTER — Ambulatory Visit: Payer: Medicaid Other | Admitting: Gastroenterology

## 2022-09-27 ENCOUNTER — Telehealth (INDEPENDENT_AMBULATORY_CARE_PROVIDER_SITE_OTHER): Payer: Self-pay | Admitting: Adult Health

## 2022-09-27 NOTE — Telephone Encounter (Signed)
6/17 Pharamacy calling prescription for ozempic. [facility] stated the dosing 1 evey 10days the insurance will not pay for, but stated it need to be 1 every 7 days. Please advise. Erie Noe

## 2022-09-28 ENCOUNTER — Other Ambulatory Visit (INDEPENDENT_AMBULATORY_CARE_PROVIDER_SITE_OTHER): Payer: Self-pay | Admitting: Adult Health

## 2022-09-28 ENCOUNTER — Encounter (INDEPENDENT_AMBULATORY_CARE_PROVIDER_SITE_OTHER): Payer: Self-pay

## 2022-09-28 MED ORDER — SEMAGLUTIDE (1 MG/DOSE) 4 MG/3ML ~~LOC~~ SOPN: 1.0000 mg | PEN_INJECTOR | SUBCUTANEOUS | 0 refills | Status: DC

## 2022-09-29 ENCOUNTER — Other Ambulatory Visit: Payer: Self-pay | Admitting: Nurse Practitioner

## 2022-09-29 DIAGNOSIS — J302 Other seasonal allergic rhinitis: Secondary | ICD-10-CM

## 2022-10-15 ENCOUNTER — Encounter: Payer: Self-pay | Admitting: Gastroenterology

## 2022-10-19 ENCOUNTER — Other Ambulatory Visit: Payer: Self-pay | Admitting: Nurse Practitioner

## 2022-10-19 DIAGNOSIS — R1011 Right upper quadrant pain: Secondary | ICD-10-CM

## 2022-10-22 ENCOUNTER — Encounter: Payer: Self-pay | Admitting: Nurse Practitioner

## 2022-10-22 ENCOUNTER — Ambulatory Visit: Payer: Medicaid Other | Admitting: Nurse Practitioner

## 2022-10-22 VITALS — BP 105/73 | HR 86 | Temp 97.6°F | Ht <= 58 in | Wt 126.6 lb

## 2022-10-22 DIAGNOSIS — R52 Pain, unspecified: Secondary | ICD-10-CM

## 2022-10-22 DIAGNOSIS — E1165 Type 2 diabetes mellitus with hyperglycemia: Secondary | ICD-10-CM | POA: Diagnosis not present

## 2022-10-22 DIAGNOSIS — R42 Dizziness and giddiness: Secondary | ICD-10-CM

## 2022-10-22 DIAGNOSIS — E538 Deficiency of other specified B group vitamins: Secondary | ICD-10-CM

## 2022-10-22 LAB — POCT GLYCOSYLATED HEMOGLOBIN (HGB A1C): Hemoglobin A1C: 5.2 % (ref 4.0–5.6)

## 2022-10-22 MED ORDER — LEVOCETIRIZINE DIHYDROCHLORIDE 5 MG PO TABS: 5.0000 mg | ORAL_TABLET | Freq: Every evening | ORAL | 2 refills | Status: DC

## 2022-10-22 NOTE — Assessment & Plan Note (Signed)
-   POCT glycosylated hemoglobin (Hb A1C) - CBC - Comprehensive metabolic panel  2. Generalized pain  - ANA - Rheumatoid factor - Sedimentation Rate - C-reactive protein - Ambulatory referral to Pain Clinic  3. Dizziness  - Iron, TIBC and Ferritin Panel  4. Vitamin B12 deficiency  - Vitamin B12  Follow up:  Follow up in 6 months

## 2022-10-22 NOTE — Progress Notes (Signed)
@Patient  ID: Betty Cain, female    DOB: 23-Apr-1975, 47 y.o.   MRN: [id number]  Chief Complaint  Patient presents with   Follow-up   Dizziness   Generalized Body Aches    Referring provider: Ivonne Andrew, NP   HPI  Patient presents today for follow-up visit.  She complains of dizziness and runny nose.  She would like to try a different allergy medication.  We will trial Xyzal.  Patient also complains of continued generalized body pain.  She has been having this issue for a while now.  All of her labs and x-rays have come back normal.  We will refer her to the pain clinic.  We will recheck inflammatory markers. Denies f/c/s, n/v/d, hemoptysis, PND, leg swelling Denies chest pain or edema       Allergies  Allergen Reactions   Seasonal Ic [Octacosanol]    Topamax [Topiramate] Itching and Rash    Immunization History  Administered Date(s) Administered   Influenza,inj,Quad PF,6+ Mos 06/19/2013, 12/02/2015   PFIZER(Purple Top)SARS-COV-2 Vaccination 07/07/2019, 07/28/2019   Pneumococcal Conjugate-13 05/21/2019   Tdap 09/13/2013, 12/11/2013    Past Medical History:  Diagnosis Date   Allergy    Anemia    Anxiety 01/2019   Asthma    B12 deficiency    Back pain    Common migraine with intractable migraine 06/28/2017   Constipation    Diabetes (HCC) 02/2019   Fatigue    GERD (gastroesophageal reflux disease)    pos H pylori   Hypertension    controlled with medication   IBS (irritable bowel syndrome) 2005   Joint pain    Neonatal death    Vaginal delivery, full term-lived x2 hours.    Palpitations    Shortness of breath    Shortness of breath on exertion    Spinal headache    Swelling of both lower extremities    Valvular heart disease    Vitamin D deficiency 02/2019    Tobacco History: Social History   Tobacco Use  Smoking Status Never  Smokeless Tobacco Never   Counseling given: Not Answered   Outpatient Encounter Medications as of 10/22/2022   Medication Sig   Accu-Chek Softclix Lancets lancets USE UP TO FOUR TIMES DAILY AS DIRECTED   acetaminophen (TYLENOL) 500 MG tablet Take 500 mg by mouth every 6 (six) hours as needed.   aspirin EC 81 MG tablet Take 81 mg by mouth daily. Swallow whole.   bisoprolol (ZEBETA) 5 MG tablet Take 1 tablet (5 mg total) by mouth 2 (two) times daily.   blood glucose meter kit and supplies KIT Dispense based on patient and insurance preference. Use up to four times daily as directed.   Blood Pressure Monitoring DEVI 1 each by Does not apply route daily.   cholecalciferol (VITAMIN D3) 25 MCG (1000 UNIT) tablet Take 1,000 Units by mouth daily.   cloNIDine (CATAPRES) 0.1 MG tablet Take 1 tablet (0.1 mg total) by mouth 2 (two) times daily.   fluticasone (FLONASE) 50 MCG/ACT nasal spray PLACE 2 SPRAYS INTO BOTH NOSTRILS DAILY.   gabapentin (NEURONTIN) 100 MG capsule Take 1 capsule (100 mg total) by mouth 3 (three) times daily.   glucose blood (ACCU-CHEK GUIDE) test strip Use as instructed   levocetirizine (XYZAL ALLERGY 24HR) 5 MG tablet Take 1 tablet (5 mg total) by mouth every evening.   metoCLOPramide (REGLAN) 5 MG tablet Take 1 tablet (5 mg total) by mouth 3 (three) times daily as needed for nausea.  omeprazole (PRILOSEC) 20 MG capsule Take 1 capsule by mouth once daily   ondansetron (ZOFRAN-ODT) 8 MG disintegrating tablet TAKE 1 TABLET BY MOUTH EVERY 8 HOURS AS NEEDED FOR NAUSEA OR VOMITING.   pantoprazole (PROTONIX) 40 MG tablet Take 1 tablet (40 mg total) by mouth 2 (two) times daily before a meal.   rosuvastatin (CRESTOR) 5 MG tablet TAKE 1 TABLET (5 MG TOTAL) BY MOUTH AT BEDTIME.   Semaglutide, 1 MG/DOSE, 4 MG/3ML SOPN Inject 1 mg as directed once a week. 1 mg every 7 days   sucralfate (CARAFATE) 1 g tablet Take 1 tablet (1 g total) by mouth every 6 (six) hours as needed. Slowly dissolve 1 tablet in 1 tablespoon of distilled water prior to ingestion.   VENTOLIN HFA 108 (90 Base) MCG/ACT inhaler INHALE  2 PUFFS BY MOUTH EVERY 6 HOURS AS NEEDED FOR WHEEZING FOR SHORTNESS OF BREATH   [DISCONTINUED] cetirizine (ZYRTEC) 10 MG tablet Take 1 tablet by mouth once daily   dicyclomine (BENTYL) 10 MG/5ML solution Take 5 mLs (10 mg total) by mouth 4 (four) times daily -  before meals and at bedtime. (Patient not taking: Reported on 07/08/2022)   docusate sodium (COLACE) 100 MG capsule Take 1 capsule (100 mg total) by mouth 2 (two) times daily. (Patient not taking: Reported on 10/22/2022)   ferrous sulfate (FEROSUL) 325 (65 FE) MG tablet TAKE 1 TABLET (325 MG TOTAL) BY MOUTH DAILY. (Patient not taking: Reported on 10/22/2022)   Fremanezumab-vfrm (AJOVY) 225 MG/1.5ML SOAJ Inject 225 mg into the skin every 30 (thirty) days. (Patient not taking: Reported on 10/22/2022)   ivabradine (CORLANOR) 5 MG TABS tablet Take 3 tablets by mouth two hours prior to cardiac CT scan. (Patient not taking: Reported on 10/22/2022)   KLOR-CON M20 20 MEQ tablet Take 1 tablet by mouth once daily (Patient not taking: Reported on 10/22/2022)   meloxicam (MOBIC) 15 MG tablet Take 1 tablet (15 mg total) by mouth daily. (Patient not taking: Reported on 10/22/2022)   methylcellulose (CITRUCEL) oral powder Take as directed, daily (Patient not taking: Reported on 10/22/2022)   Multiple Vitamin (MULTI VITAMIN DAILY PO) Take by mouth. (Patient not taking: Reported on 10/22/2022)   oxyCODONE-acetaminophen (PERCOCET/ROXICET) 5-325 MG tablet Take 1 tablet by mouth every 6 (six) hours as needed for severe pain. (Patient not taking: Reported on 10/22/2022)   Plecanatide (TRULANCE) 3 MG TABS Take 3 mg by mouth daily. (Patient not taking: Reported on 10/22/2022)   Rimegepant Sulfate 75 MG TBDP Take 1 tab at onset of migraine. May repeat in 2 hrs, if needed. Max dose: 2 tabs/day or 15/month. This is a 90 day rx. (Patient not taking: Reported on 10/22/2022)   SPRINTEC 28 0.25-35 MG-MCG tablet Take 1 tablet by mouth once daily (Patient not taking: Reported on  10/22/2022)   [DISCONTINUED] fexofenadine (ALLEGRA ALLERGY) 180 MG tablet Take 1 tablet (180 mg total) by mouth daily. (Patient not taking: Reported on 10/22/2022)   [DISCONTINUED] glipiZIDE (GLUCOTROL) 10 MG tablet Take 1 tablet (10 mg total) by mouth 2 (two) times daily before a meal.   No facility-administered encounter medications on file as of 10/22/2022.     Review of Systems  Review of Systems  Constitutional: Negative.   HENT: Negative.    Cardiovascular: Negative.   Gastrointestinal: Negative.   Musculoskeletal:        Generalized pain  Allergic/Immunologic: Negative.   Neurological: Negative.   Psychiatric/Behavioral: Negative.         Physical Exam  BP 105/73   Pulse 86   Temp 97.6 F (36.4 C)   Ht 4\' 10"  (1.473 m)   Wt 126 lb 9.6 oz (57.4 kg)   LMP 10/14/2022 (Approximate)   SpO2 100%   BMI 26.46 kg/m   Wt Readings from Last 5 Encounters:  10/22/22 126 lb 9.6 oz (57.4 kg)  09/20/22 124 lb (56.2 kg)  08/23/22 123 lb (55.8 kg)  07/27/22 129 lb (58.5 kg)  07/26/22 126 lb (57.2 kg)     Physical Exam Vitals and nursing note reviewed.  Constitutional:      General: She is not in acute distress.    Appearance: She is well-developed.  Cardiovascular:     Rate and Rhythm: Normal rate and regular rhythm.  Pulmonary:     Effort: Pulmonary effort is normal.     Breath sounds: Normal breath sounds.  Neurological:     Mental Status: She is alert and oriented to person, place, and time.      Lab Results:  CBC    Component Value Date/Time   WBC 8.6 07/08/2022 1029   WBC 5.6 08/30/2018 1240   RBC 3.71 (L) 07/08/2022 1029   RBC 4.47 08/30/2018 1240   HGB 11.4 07/08/2022 1029   HCT 34.2 07/08/2022 1029   PLT 335 07/08/2022 1029   MCV 92 07/08/2022 1029   MCH 30.7 07/08/2022 1029   MCH 26.8 08/30/2018 1240   MCHC 33.3 07/08/2022 1029   MCHC 31.7 08/30/2018 1240   RDW 12.8 07/08/2022 1029   LYMPHSABS 3.8 (H) 05/12/2021 1107   MONOABS 0.3 08/30/2018  1240   EOSABS 0.2 05/12/2021 1107   BASOSABS 0.1 05/12/2021 1107    BMET    Component Value Date/Time   NA 138 07/08/2022 1029   K 4.5 07/08/2022 1029   CL 103 07/08/2022 1029   CO2 20 07/08/2022 1029   GLUCOSE 106 (H) 07/08/2022 1029   GLUCOSE 185 (H) 08/30/2018 1240   GLUCOSE 77 09/21/2013 1230   BUN 19 07/08/2022 1029   CREATININE 0.60 07/08/2022 1029   CREATININE 0.58 09/14/2016 0939   CALCIUM 9.5 07/08/2022 1029   GFRNONAA 109 01/02/2020 1027   GFRNONAA >89 09/14/2016 0939   GFRAA 126 01/02/2020 1027   GFRAA >89 09/14/2016 0939      Assessment & Plan:   Type 2 diabetes mellitus with hyperglycemia, without long-term current use of insulin (HCC) - POCT glycosylated hemoglobin (Hb A1C) - CBC - Comprehensive metabolic panel  2. Generalized pain  - ANA - Rheumatoid factor - Sedimentation Rate - C-reactive protein - Ambulatory referral to Pain Clinic  3. Dizziness  - Iron, TIBC and Ferritin Panel  4. Vitamin B12 deficiency  - Vitamin B12  Follow up:  Follow up in 6 months     Ivonne Andrew, NP 10/22/2022

## 2022-10-22 NOTE — Patient Instructions (Signed)
1. Type 2 diabetes mellitus with hyperglycemia, without long-term current use of insulin (HCC)  - POCT glycosylated hemoglobin (Hb A1C) - CBC - Comprehensive metabolic panel  2. Generalized pain  - ANA - Rheumatoid factor - Sedimentation Rate - C-reactive protein - Ambulatory referral to Pain Clinic  3. Dizziness  - Iron, TIBC and Ferritin Panel  4. Vitamin B12 deficiency  - Vitamin B12  Follow up:  Follow up in 6 months

## 2022-10-25 LAB — COMPREHENSIVE METABOLIC PANEL
ALT: 12 IU/L (ref 0–32)
AST: 13 IU/L (ref 0–40)
Albumin: 4.6 g/dL (ref 3.9–4.9)
Alkaline Phosphatase: 81 IU/L (ref 44–121)
BUN/Creatinine Ratio: 24 — ABNORMAL HIGH (ref 9–23)
BUN: 15 mg/dL (ref 6–24)
Bilirubin Total: 0.4 mg/dL (ref 0.0–1.2)
CO2: 19 mmol/L — ABNORMAL LOW (ref 20–29)
Calcium: 9.6 mg/dL (ref 8.7–10.2)
Chloride: 101 mmol/L (ref 96–106)
Creatinine, Ser: 0.63 mg/dL (ref 0.57–1.00)
Globulin, Total: 2.6 g/dL (ref 1.5–4.5)
Glucose: 93 mg/dL (ref 70–99)
Potassium: 4.1 mmol/L (ref 3.5–5.2)
Sodium: 141 mmol/L (ref 134–144)
Total Protein: 7.2 g/dL (ref 6.0–8.5)
eGFR: 112 mL/min/{1.73_m2} (ref >59)

## 2022-10-25 LAB — SEDIMENTATION RATE: Sed Rate: 42 mm/hr — ABNORMAL HIGH (ref 0–32)

## 2022-10-25 LAB — IRON,TIBC AND FERRITIN PANEL
Ferritin: 295 ng/mL — ABNORMAL HIGH (ref 15–150)
Iron Saturation: 21 % (ref 15–55)
Iron: 53 ug/dL (ref 27–159)
Total Iron Binding Capacity: 253 ug/dL (ref 250–450)
UIBC: 200 ug/dL (ref 131–425)

## 2022-10-25 LAB — CBC
Hematocrit: 37.1 % (ref 34.0–46.6)
Hemoglobin: 11.9 g/dL (ref 11.1–15.9)
MCH: 29.2 pg (ref 26.6–33.0)
MCHC: 32.1 g/dL (ref 31.5–35.7)
MCV: 91 fL (ref 79–97)
Platelets: 277 10*3/uL (ref 150–450)
RBC: 4.08 x10E6/uL (ref 3.77–5.28)
RDW: 12.8 % (ref 11.7–15.4)
WBC: 7.1 10*3/uL (ref 3.4–10.8)

## 2022-10-25 LAB — VITAMIN B12: Vitamin B-12: 368 pg/mL (ref 232–1245)

## 2022-10-25 LAB — RHEUMATOID FACTOR: Rheumatoid fact SerPl-aCnc: <10 IU/mL (ref <14.0)

## 2022-10-25 LAB — C-REACTIVE PROTEIN: CRP: 5 mg/L (ref 0–10)

## 2022-10-25 LAB — ANA: Anti Nuclear Antibody (ANA): NEGATIVE

## 2022-10-26 ENCOUNTER — Ambulatory Visit (INDEPENDENT_AMBULATORY_CARE_PROVIDER_SITE_OTHER): Payer: Medicaid Other | Admitting: Adult Health

## 2022-10-26 ENCOUNTER — Encounter (INDEPENDENT_AMBULATORY_CARE_PROVIDER_SITE_OTHER): Payer: Self-pay | Admitting: Adult Health

## 2022-10-26 VITALS — BP 104/67 | HR 87 | Temp 98.6°F | Ht <= 58 in | Wt 122.0 lb

## 2022-10-26 DIAGNOSIS — E1159 Type 2 diabetes mellitus with other circulatory complications: Secondary | ICD-10-CM

## 2022-10-26 DIAGNOSIS — I152 Hypertension secondary to endocrine disorders: Secondary | ICD-10-CM

## 2022-10-26 DIAGNOSIS — E1169 Type 2 diabetes mellitus with other specified complication: Secondary | ICD-10-CM

## 2022-10-26 DIAGNOSIS — Z7985 Long-term (current) use of injectable non-insulin antidiabetic drugs: Secondary | ICD-10-CM

## 2022-10-26 DIAGNOSIS — E669 Obesity, unspecified: Secondary | ICD-10-CM

## 2022-10-26 DIAGNOSIS — Z6825 Body mass index (BMI) 25.0-25.9, adult: Secondary | ICD-10-CM

## 2022-10-26 DIAGNOSIS — K5900 Constipation, unspecified: Secondary | ICD-10-CM

## 2022-10-26 MED ORDER — SEMAGLUTIDE (1 MG/DOSE) 4 MG/3ML ~~LOC~~ SOPN: 1.0000 mg | PEN_INJECTOR | SUBCUTANEOUS | 0 refills | Status: DC

## 2022-10-26 NOTE — Progress Notes (Signed)
WEIGHT SUMMARY AND BIOMETRICS  Vitals Temp: 98.6 F (37 C) BP: 104/67 Pulse Rate: 87 SpO2: 98 %   Anthropometric Measurements Height: 4\' 10"  (1.473 m) Weight: 122 lb (55.3 kg) BMI (Calculated): 25.51 Weight at Last Visit: 124 lb Weight Lost Since Last Visit: 2 Starting Weight: 152 lb Total Weight Loss (lbs): 30 lb (13.6 kg)   Body Composition  Body Fat %: 36.7 % Fat Mass (lbs): 45 lbs Muscle Mass (lbs): 73.8 lbs Total Body Water (lbs): 54.8 lbs Visceral Fat Rating : 6   Other Clinical Data Today's Visit #: 35 Starting Date: 01/10/20    Chief Complaint:   OBESITY Betty Cain is here to discuss her progress with her obesity treatment plan. She is on the the Category 2 Plan and states she is following her eating plan approximately 90 % of the time. She states she is not currently exercising due to recent extreme high temperatures.   Interim History:  Betty Cain has been on Ozempic 1mg  injection Q10days for months Denies mass in neck, dysphagia, dyspepsia, persistent hoarseness, abdominal pain, or N/V/C   Hunger/appetite-she denies polyphagia  Sleep- she estimates to sleep 6 hrs/night  Exercise-she has temporarily paused outdoor walking while summer temperatures have been so extreme lately  Hydration-she estimates to drink "3 cups plain water/day".  10/22/2022 PCP OV- Labs completed and referred to Pain Clinic  Of Note- [place] interpretor at [facility] during OV   Subjective:   1. Hypertension associated with diabetes (HCC) BP at goal at OV She is on: ivabradine (CORLANOR) 5 MG TABS tablet  rosuvastatin (CRESTOR) 5 MG tablet  cloNIDine (CATAPRES) 0.1 MG tablet  bisoprolol (ZEBETA) 5 MG tablet  aspirin EC 81 MG tablet  Semaglutide, 1 MG/DOSE, 4 MG/3ML SOPN   2. Type 2 diabetes mellitus with obesity (HCC) Lab Results  Component Value Date   HGBA1C 5.2 10/22/2022   HGBA1C 5.2 06/21/2022   HGBA1C 5.1 04/09/2022   Per pt fasting CBG 80-100s She denies  sx's of hypoglycemia She is on Oxempic 1mg  injection Q10 days Denies mass in neck, dysphagia, dyspepsia, persistent hoarseness, abdominal pain, or N/V/C   3. Constipation, unspecified constipation type She reports constipation- stable, or even improving slightly. She denies abdominal pain or hematochezia   Assessment/Plan:   1. Hypertension associated with diabetes (HCC) Increase plain water intake Monitor for sx's of hypotension- if noted, follow-up with PCP  2. Type 2 diabetes mellitus with obesity (HCC) Refill  Semaglutide, 1 MG/DOSE, 4 MG/3ML SOPN Inject 1 mg as directed once a week. 1 mg every 7 days Dispense: 9 mL, Refills: 0 ordered   3. Constipation, unspecified constipation type Increase water and fiber in diet  4. Obesity, current BMI 25.9  Betty Cain is currently in the action stage of change. As such, her goal is to maintain weight for now. She has agreed to the Category 2 Plan.   Exercise goals: For substantial health benefits, adults should do at least 150 minutes (2 hours and 30 minutes) a week of moderate-intensity, or 75 minutes (1 hour and 15 minutes) a week of vigorous-intensity aerobic physical activity, or an equivalent combination of moderate- and vigorous-intensity aerobic activity. Aerobic activity should be performed in episodes of at least 10 minutes, and preferably, it should be spread throughout the week.  Behavioral modification strategies: increasing lean protein intake, decreasing simple carbohydrates, increasing vegetables, increasing water intake, no skipping meals, meal planning and cooking strategies, and planning for success.  Betty Cain has agreed to follow-up  with our clinic in 6 weeks. She was informed of the importance of frequent follow-up visits to maximize her success with intensive lifestyle modifications for her multiple health conditions.   Objective:   Blood pressure 104/67, pulse 87, temperature 98.6 F (37 C), height 4\' 10"  (1.473 m), weight  122 lb (55.3 kg), last menstrual period 10/14/2022, SpO2 98%. Body mass index is 25.5 kg/m.  General: Cooperative, alert, well developed, in no acute distress. HEENT: Conjunctivae and lids unremarkable. Cardiovascular: Regular rhythm.  Lungs: Normal work of breathing. Neurologic: No focal deficits.   Lab Results  Component Value Date   CREATININE 0.63 10/22/2022   BUN 15 10/22/2022   NA 141 10/22/2022   K 4.1 10/22/2022   CL 101 10/22/2022   CO2 19 (L) 10/22/2022   Lab Results  Component Value Date   ALT 12 10/22/2022   AST 13 10/22/2022   ALKPHOS 81 10/22/2022   BILITOT 0.4 10/22/2022   Lab Results  Component Value Date   HGBA1C 5.2 10/22/2022   HGBA1C 5.2 06/21/2022   HGBA1C 5.1 04/09/2022   HGBA1C 4.9 12/28/2021   HGBA1C 5.0 08/19/2021   HGBA1C 5.0 08/19/2021   HGBA1C 5.0 (A) 08/19/2021   HGBA1C 5.0 08/19/2021   Lab Results  Component Value Date   INSULIN 7.9 06/21/2022   INSULIN 3.8 12/28/2021   INSULIN 8.7 01/10/2020   Lab Results  Component Value Date   TSH 2.530 12/28/2021   Lab Results  Component Value Date   CHOL 117 04/09/2022   HDL 44 04/09/2022   LDLCALC 58 04/09/2022   TRIG 75 04/09/2022   CHOLHDL 2.7 04/09/2022   Lab Results  Component Value Date   VD25OH 51.2 06/21/2022   VD25OH 35.6 12/28/2021   VD25OH 22.7 (L) 10/09/2021   Lab Results  Component Value Date   WBC 7.1 10/22/2022   HGB 11.9 10/22/2022   HCT 37.1 10/22/2022   MCV 91 10/22/2022   PLT 277 10/22/2022   Lab Results  Component Value Date   IRON 53 10/22/2022   TIBC 253 10/22/2022   FERRITIN 295 (H) 10/22/2022   Attestation Statements:   Reviewed by clinician on day of visit: allergies, medications, problem list, medical history, surgical history, family history, social history, and previous encounter notes.  I have reviewed the above documentation for accuracy and completeness, and I agree with the above. -  Henery Betzold d. Karmel Patricelli, NP-C

## 2022-11-03 ENCOUNTER — Encounter: Payer: Self-pay | Admitting: Physical Medicine and Rehabilitation

## 2022-11-17 ENCOUNTER — Other Ambulatory Visit: Payer: Self-pay | Admitting: Nurse Practitioner

## 2022-11-17 DIAGNOSIS — R1011 Right upper quadrant pain: Secondary | ICD-10-CM

## 2022-11-19 ENCOUNTER — Other Ambulatory Visit: Payer: Self-pay | Admitting: Nurse Practitioner

## 2022-11-19 ENCOUNTER — Other Ambulatory Visit: Payer: Self-pay | Admitting: Gastroenterology

## 2022-11-19 DIAGNOSIS — R1011 Right upper quadrant pain: Secondary | ICD-10-CM

## 2022-11-19 DIAGNOSIS — E0842 Diabetes mellitus due to underlying condition with diabetic polyneuropathy: Secondary | ICD-10-CM

## 2022-11-19 NOTE — Telephone Encounter (Signed)
Please advise Kh 

## 2022-11-30 DIAGNOSIS — I129 Hypertensive chronic kidney disease with stage 1 through stage 4 chronic kidney disease, or unspecified chronic kidney disease: Secondary | ICD-10-CM | POA: Diagnosis not present

## 2022-11-30 DIAGNOSIS — R809 Proteinuria, unspecified: Secondary | ICD-10-CM | POA: Diagnosis not present

## 2022-11-30 DIAGNOSIS — R319 Hematuria, unspecified: Secondary | ICD-10-CM | POA: Diagnosis not present

## 2022-11-30 DIAGNOSIS — G8929 Other chronic pain: Secondary | ICD-10-CM | POA: Diagnosis not present

## 2022-11-30 DIAGNOSIS — E119 Type 2 diabetes mellitus without complications: Secondary | ICD-10-CM | POA: Diagnosis not present

## 2022-12-07 ENCOUNTER — Ambulatory Visit (INDEPENDENT_AMBULATORY_CARE_PROVIDER_SITE_OTHER): Payer: Medicaid Other | Admitting: Adult Health

## 2022-12-10 ENCOUNTER — Encounter
Payer: Medicaid Other | Attending: Physical Medicine and Rehabilitation | Admitting: Physical Medicine and Rehabilitation

## 2022-12-10 ENCOUNTER — Encounter: Payer: Self-pay | Admitting: Physical Medicine and Rehabilitation

## 2022-12-10 ENCOUNTER — Telehealth: Payer: Self-pay | Admitting: *Deleted

## 2022-12-10 VITALS — BP 105/74 | HR 93 | Ht <= 58 in | Wt 127.0 lb

## 2022-12-10 DIAGNOSIS — M7918 Myalgia, other site: Secondary | ICD-10-CM | POA: Insufficient documentation

## 2022-12-10 DIAGNOSIS — M545 Low back pain, unspecified: Secondary | ICD-10-CM | POA: Insufficient documentation

## 2022-12-10 MED ORDER — LIDOCAINE 5 % EX PTCH: 1.0000 | MEDICATED_PATCH | CUTANEOUS | 0 refills | Status: DC

## 2022-12-10 NOTE — Telephone Encounter (Signed)
Kim Rodwin (Key: [id number]) - [id number] Lidocaine 5% patches Status: PA Response - ApprovedCreated: August 30th, 2024 [id number] Sent: August 30th

## 2022-12-10 NOTE — Telephone Encounter (Signed)
Betty Cain (Key: [id number]) - [id number] Lidocaine 5% patches Status: PA RequestCreated: August 30th, 2024 [id number]

## 2022-12-10 NOTE — Patient Instructions (Signed)
Foods that can assist in weight loss: 1) leafy greens- high in fiber and nutrients 2) dark chocolate- improves metabolism (if prefer sweetened, best to sweeten with honey instead of sugar).  3) cruciferous vegetables- high in fiber and protein 4) full fat yogurt: high in healthy fat, protein, calcium, and probiotics 5) apples- high in a variety of phytochemicals 6) nuts- high in fiber and protein that increase feelings of fullness 7) grapefruit: rich in nutrients, antioxidants, and fiber (not to be taken with anticoagulation) 8) beans- high in protein and fiber 9) salmon- has high quality protein and healthy fats 10) green tea- rich in polyphenols 11) eggs- rich in choline and vitamin D 12) tuna- high protein, boosts metabolism 13) avocado- decreases visceral abdominal fat 14) chicken (pasture raised): high in protein and iron 15) blueberries- reduce abdominal fat and cholesterol 16) whole grains- decreases calories retained during digestion, speeds metabolism 17) chia seeds- curb appetite 18) chilies- increases fat metabolism  -Discussed supplements that can be used:  1) Metatrim 400mg  BID 30 minutes before breakfast and dinner  2) Sphaeranthus indicus and Garcinia mangostana (combinations of these and #1 can be found in capsicum and zychrome  3) green coffee bean extract 400mg  twice per day or Irvingia (african mango) 150 to 300mg  twice per day.  Turmeric to reduce inflammation--can be used in cooking or taken as a supplement.  Benefits of turmeric:  -Highly anti-inflammatory  -Increases antioxidants  -Improves memory, attention, brain disease  -Lowers risk of heart disease  -May help prevent cancer  -Decreases pain  -Alleviates depression  -Delays aging and decreases risk of chronic disease  -Consume with black pepper to increase absorption    Turmeric Milk Recipe:  1 cup milk  1 tsp turmeric  1 tsp cinnamon  1 tsp grated ginger (optional)  Black pepper  (boosts the anti-inflammatory properties of turmeric).  1 tsp honey

## 2022-12-10 NOTE — Progress Notes (Addendum)
Subjective:    Patient ID: Betty Cain, female    DOB: 06-29-1975, 47 y.o.   MRN: [id number]  HPI  Betty Cain is a 47 year old woman who presents with generalized pain.  1) Generalized pain: -has tried tizanidine, gabapentin, tylenol, ibuprofen, oxycodone- none helped -was asked to avoid NSAIDs due to proteinuria -has had pain present in her whole body for the past 4 years -has tried physical therapy and medications- she has had temporary benefit. -she is interested in trying TENS unit  2) Overweight: -BMI reviewed and is 26.54  Pain Inventory Average Pain 9 Pain Right Now 8 My pain is constant, sharp, and aching  In the last 24 hours, has pain interfered with the following? General activity 7 Relation with others 6 Enjoyment of life 6 What TIME of day is your pain at its worst? daytime Sleep (in general) Fair  Pain is worse with: walking, bending, sitting, inactivity, standing, and some activites Pain improves with: therapy/exercise Relief from Meds: 5  walk without assistance ability to climb steps?  yes do you drive?  yes Do you have any goals in this area?  yes   Not employed Does not have any goals in this area weakness dizziness  na  New patient consult    Family History  Problem Relation Age of Onset   Diabetes Mother    Diabetes Father    Diabetes Sister    Migraines Sister    Migraines Daughter    Anesthesia problems Neg Hx    Colon cancer Neg Hx    Esophageal cancer Neg Hx    Rectal cancer Neg Hx    Stomach cancer Neg Hx    Colon polyps Neg Hx    Social History   Socioeconomic History   Marital status: Married    Spouse name: [name]   Number of children: 4   Years of education: Not on file   Highest education level: Some college, no degree  Occupational History   Occupation: Unemployed  Tobacco Use   Smoking status: Never   Smokeless tobacco: Never  Vaping Use   Vaping status: Never Used  Substance and Sexual Activity    Alcohol use: No   Drug use: No   Sexual activity: Yes    Birth control/protection: None    Comment: pregnant  Other Topics Concern   Not on file  Social History Narrative   Lives with husband and child   Caffeine use: daily (tea), sometimes coffee/soda   Right handed    Social Determinants of Health   Financial Resource Strain: Low Risk  (07/07/2022)   Overall Financial Resource Strain (CARDIA)    Difficulty of Paying Living Expenses: Not very hard  Food Insecurity: No Food Insecurity (07/07/2022)   Hunger Vital Sign    Worried About Running Out of Food in the Last Year: Never true    Ran Out of Food in the Last Year: Never true  Transportation Needs: No Transportation Needs (07/07/2022)   PRAPARE - Administrator, Civil Service (Medical): No    Lack of Transportation (Non-Medical): No  Physical Activity: Insufficiently Active (07/07/2022)   Exercise Vital Sign    Days of Exercise per Week: 2 days    Minutes of Exercise per Session: 10 min  Stress: No Stress Concern Present (07/07/2022)   [employer] of Occupational Health - Occupational Stress Questionnaire    Feeling of Stress : Not at all  Social Connections: Moderately Isolated (07/07/2022)  Social Connection and Isolation Panel [NHANES]    Frequency of Communication with Friends and Family: More than three times a week    Frequency of Social Gatherings with Friends and Family: Once a week    Attends Religious Services: Never    Database administrator or Organizations: No    Attends Engineer, structural: Not on file    Marital Status: Married   Past Surgical History:  Procedure Laterality Date   ADENOIDECTOMY     and tonsils (as a child)   BIOPSY  08/27/2021   Procedure: BIOPSY;  Surgeon: Benancio Deeds, MD;  Location: [facility];  Service: Gastroenterology;;   boil  2003   right elbow   CESAREAN SECTION     x4   CESAREAN SECTION  06/02/2011   Procedure: CESAREAN SECTION;  Surgeon:  Tilda Burrow, MD;  Location: [facility];  Service: Gynecology;  Laterality: N/A;  Primary Cesarean Section Delivery Baby Boy @ 0004, Apgars 9/9   CESAREAN SECTION N/A 12/10/2013   Procedure: REPEAT CESAREAN SECTION;  Surgeon: Catalina Antigua, MD;  Location: [facility];  Service: Obstetrics;  Laterality: N/A;   CESAREAN SECTION     colonoscopy  11/23/2018   stated had issues with sleep when in for procedure   ESOPHAGOGASTRODUODENOSCOPY (EGD) WITH PROPOFOL N/A 08/27/2021   Procedure: ESOPHAGOGASTRODUODENOSCOPY (EGD) WITH PROPOFOL;  Surgeon: Benancio Deeds, MD;  Location: [facility];  Service: Gastroenterology;  Laterality: N/A;   OTHER SURGICAL HISTORY  2005   Uterine surgery    uterine cauterization     Past Medical History:  Diagnosis Date   Allergy    Anemia    Anxiety 01/2019   Asthma    B12 deficiency    Back pain    Common migraine with intractable migraine 06/28/2017   Constipation    Diabetes (HCC) 02/2019   Fatigue    GERD (gastroesophageal reflux disease)    pos H pylori   Hypertension    controlled with medication   IBS (irritable bowel syndrome) 2005   Joint pain    Neonatal death    Vaginal delivery, full term-lived x2 hours.    Palpitations    Shortness of breath    Shortness of breath on exertion    Spinal headache    Swelling of both lower extremities    Valvular heart disease    Vitamin D deficiency 02/2019   BP 105/74   Pulse 93   Ht 4\' 10"  (1.473 m)   Wt 127 lb (57.6 kg)   SpO2 98%   BMI 26.54 kg/m   Opioid Risk Score:   Fall Risk Score:  `1  Depression screen [facility] 2/9     12/10/2022   10:54 AM 10/22/2022    8:40 AM 07/08/2022    9:44 AM 04/21/2022    9:45 AM 10/09/2021   11:41 AM 08/19/2021    9:01 AM 05/18/2021   12:06 PM  Depression screen PHQ 2/9  Decreased Interest 0 0 0 0 0 0 0  Down, Depressed, Hopeless 0 0 0 0 0 0 0  PHQ - 2 Score 0 0 0 0 0 0 0  Altered sleeping 1        Tired, decreased energy 3        Change in appetite 0         Feeling bad or failure about yourself  0        Trouble concentrating 3  Moving slowly or fidgety/restless 1        Suicidal thoughts 0        PHQ-9 Score 8            Review of Systems  Musculoskeletal:  Positive for neck pain.       Facial B/L shoulder arm pain  All other systems reviewed and are negative.      Objective:   Physical Exam Gen: no distress, normal appearing HEENT: oral mucosa pink and moist, NCAT Cardio: Reg rate Chest: normal effort, normal rate of breathing Abd: soft, non-distended Ext: no edema Psych: pleasant, normal affect Skin: intact Neuro: Alert and oriented x3     Assessment & Plan:   1) Cervical myofascial pain syndrome: -will schedule for trigger point injections  2) Fibromyalgia: -Provided with a pain relief journal and discussed that it contains foods and lifestyle tips to naturally help to improve pain. Discussed that these lifestyle strategies are also very good for health unlike some medications which can have negative side effects. Discussed that the act of keeping a journal can be therapeutic and helpful to realize patterns what helps to trigger and alleviate pain.    Prescribing Home Zynex NexWave Stimulator Device and supplies as needed. IFC, NMES and TENS medically necessary Treatment Rx: Daily @ 30-40 minutes per treatment PRN. Zynex NexWave only, no substitutions. Treatment Goals: 1) To reduce and/or eliminate pain 2) To improve functional capacity and Activities of daily living 3) To reduce or prevent the need for oral medications 4) To improve circulation in the injured region 5) To decrease or prevent muscle spasm and muscle atrophy 6) To provide a self-management tool to the patient The patient has not sufficiently improved with conservative care. Numerous studies indexed by Medline and PubMed.gov have shown Neuromuscular, Interferential, and TENS stimulators to reduce pain, improve function, and reduce medication use in  injured patients. Continued use of this evidence based, safe, drug free treatment is both reasonable and medically necessary at this time.   Turmeric to reduce inflammation--can be used in cooking or taken as a supplement.  Benefits of turmeric:  -Highly anti-inflammatory  -Increases antioxidants  -Improves memory, attention, brain disease  -Lowers risk of heart disease  -May help prevent cancer  -Decreases pain  -Alleviates depression  -Delays aging and decreases risk of chronic disease  -Consume with black pepper to increase absorption    Turmeric Milk Recipe:  1 cup milk  1 tsp turmeric  1 tsp cinnamon  1 tsp grated ginger (optional)  Black pepper (boosts the anti-inflammatory properties of turmeric).  1 tsp honey    3) Overweight:  -Reviewed that current weight is 127 lbs and BMI is 26.45 -Educated regarding health benefits of weight loss- for pain, general health, chronic disease prevention, immune health, mental health.  -Will monitor weight every visit.  -Consider [place] tea daily.  -Discussed the benefits of intermittent fasting. -Discussed foods that can assist in weight loss: 1) leafy greens- high in fiber and nutrients 2) dark chocolate- improves metabolism (if prefer sweetened, best to sweeten with honey instead of sugar).  3) cruciferous vegetables- high in fiber and protein 4) full fat yogurt: high in healthy fat, protein, calcium, and probiotics 5) apples- high in a variety of phytochemicals 6) nuts- high in fiber and protein that increase feelings of fullness 7) grapefruit: rich in nutrients, antioxidants, and fiber (not to be taken with anticoagulation) 8) beans- high in protein and fiber 9) salmon- has high quality protein and  healthy fats 10) green tea- rich in polyphenols 11) eggs- rich in choline and vitamin D 12) tuna- high protein, boosts metabolism 13) avocado- decreases visceral abdominal fat 14) chicken (pasture raised): high in  protein and iron 15) blueberries- reduce abdominal fat and cholesterol 16) whole grains- decreases calories retained during digestion, speeds metabolism 17) chia seeds- curb appetite 18) chilies- increases fat metabolism  -Discussed supplements that can be used:  1) Metatrim 400mg  BID 30 minutes before breakfast and dinner  2) Sphaeranthus indicus and Garcinia mangostana (combinations of these and #1 can be found in capsicum and zychrome  3) green coffee bean extract 400mg  twice per day or Irvingia (african mango) 150 to 300mg  twice per day.  4) Low back pain: Prescribed Zynex Nexwave and spinal brace -discussed to use spinal brace no more than 4-6 hours per day

## 2022-12-13 ENCOUNTER — Emergency Department (HOSPITAL_COMMUNITY): Payer: Medicaid Other

## 2022-12-13 ENCOUNTER — Emergency Department (HOSPITAL_COMMUNITY)
Admission: EM | Admit: 2022-12-13 | Discharge: 2022-12-13 | Disposition: A | Discharge status: Home / Self Care | Payer: Medicaid Other | Attending: Emergency Medicine | Admitting: Emergency Medicine

## 2022-12-13 ENCOUNTER — Encounter (HOSPITAL_COMMUNITY): Payer: Self-pay

## 2022-12-13 ENCOUNTER — Other Ambulatory Visit: Payer: Self-pay

## 2022-12-13 DIAGNOSIS — R109 Unspecified abdominal pain: Secondary | ICD-10-CM | POA: Diagnosis not present

## 2022-12-13 DIAGNOSIS — K2900 Acute gastritis without bleeding: Secondary | ICD-10-CM | POA: Insufficient documentation

## 2022-12-13 DIAGNOSIS — Z7982 Long term (current) use of aspirin: Secondary | ICD-10-CM | POA: Insufficient documentation

## 2022-12-13 DIAGNOSIS — D72829 Elevated white blood cell count, unspecified: Secondary | ICD-10-CM | POA: Diagnosis not present

## 2022-12-13 DIAGNOSIS — I10 Essential (primary) hypertension: Secondary | ICD-10-CM | POA: Diagnosis not present

## 2022-12-13 LAB — URINALYSIS, ROUTINE W REFLEX MICROSCOPIC
Bacteria, UA: NONE SEEN
Bilirubin Urine: NEGATIVE
Glucose, UA: NEGATIVE mg/dL
Ketones, ur: 5 mg/dL — AB
Leukocytes,Ua: NEGATIVE
Nitrite: NEGATIVE
Protein, ur: 30 mg/dL — AB
Specific Gravity, Urine: 1.025 (ref 1.005–1.030)
pH: 5 (ref 5.0–8.0)

## 2022-12-13 LAB — MAGNESIUM: Magnesium: 2 mg/dL (ref 1.7–2.4)

## 2022-12-13 LAB — COMPREHENSIVE METABOLIC PANEL
ALT: 11 U/L (ref 0–44)
AST: 17 U/L (ref 15–41)
Albumin: 4.1 g/dL (ref 3.5–5.0)
Alkaline Phosphatase: 70 U/L (ref 38–126)
Anion gap: 17 — ABNORMAL HIGH (ref 5–15)
BUN: 13 mg/dL (ref 6–20)
CO2: 24 mmol/L (ref 22–32)
Calcium: 9.7 mg/dL (ref 8.9–10.3)
Chloride: 101 mmol/L (ref 98–111)
Creatinine, Ser: 0.6 mg/dL (ref 0.44–1.00)
GFR, Estimated: >60 mL/min (ref >60)
Glucose, Bld: 100 mg/dL — ABNORMAL HIGH (ref 70–99)
Potassium: 3.7 mmol/L (ref 3.5–5.1)
Sodium: 142 mmol/L (ref 135–145)
Total Bilirubin: 0.7 mg/dL (ref 0.3–1.2)
Total Protein: 8 g/dL (ref 6.5–8.1)

## 2022-12-13 LAB — CBC
HCT: 42 % (ref 36.0–46.0)
Hemoglobin: 13.7 g/dL (ref 12.0–15.0)
MCH: 29.7 pg (ref 26.0–34.0)
MCHC: 32.6 g/dL (ref 30.0–36.0)
MCV: 90.9 fL (ref 80.0–100.0)
Platelets: 316 10*3/uL (ref 150–400)
RBC: 4.62 MIL/uL (ref 3.87–5.11)
RDW: 12.6 % (ref 11.5–15.5)
WBC: 13.8 10*3/uL — ABNORMAL HIGH (ref 4.0–10.5)
nRBC: 0 % (ref 0.0–0.2)

## 2022-12-13 LAB — LIPASE, BLOOD: Lipase: 44 U/L (ref 11–51)

## 2022-12-13 LAB — HCG, SERUM, QUALITATIVE: Preg, Serum: NEGATIVE

## 2022-12-13 MED ORDER — ONDANSETRON HCL 4 MG/2ML IJ SOLN
4.0000 mg | Freq: Once | INTRAMUSCULAR | Status: AC
  Administered 2022-12-13: 4 mg via INTRAVENOUS
  Filled 2022-12-13: qty 2

## 2022-12-13 MED ORDER — IOHEXOL 350 MG/ML SOLN
75.0000 mL | Freq: Once | INTRAVENOUS | Status: AC | PRN
  Administered 2022-12-13: 75 mL via INTRAVENOUS

## 2022-12-13 MED ORDER — MAGNESIUM SULFATE 2 GM/50ML IV SOLN
2.0000 g | Freq: Once | INTRAVENOUS | Status: AC
  Administered 2022-12-13: 2 g via INTRAVENOUS
  Filled 2022-12-13: qty 50

## 2022-12-13 MED ORDER — SUCRALFATE 1 G PO TABS
1.0000 g | ORAL_TABLET | Freq: Once | ORAL | Status: AC
  Administered 2022-12-13: 1 g via ORAL
  Filled 2022-12-13: qty 1

## 2022-12-13 MED ORDER — MORPHINE SULFATE (PF) 4 MG/ML IV SOLN
4.0000 mg | Freq: Once | INTRAVENOUS | Status: AC
  Administered 2022-12-13: 4 mg via INTRAVENOUS
  Filled 2022-12-13: qty 1

## 2022-12-13 MED ORDER — OMEPRAZOLE 20 MG PO CPDR: 40.0000 mg | DELAYED_RELEASE_CAPSULE | Freq: Every day | ORAL | 0 refills | Status: DC

## 2022-12-13 MED ORDER — ONDANSETRON HCL 4 MG/2ML IJ SOLN: 4.0000 mg | Freq: Once | INTRAMUSCULAR | Status: DC

## 2022-12-13 MED ORDER — PANTOPRAZOLE SODIUM 40 MG IV SOLR
40.0000 mg | Freq: Once | INTRAVENOUS | Status: AC
  Administered 2022-12-13: 40 mg via INTRAVENOUS
  Filled 2022-12-13: qty 10

## 2022-12-13 MED ORDER — ONDANSETRON HCL 4 MG PO TABS: 4.0000 mg | ORAL_TABLET | Freq: Four times a day (QID) | ORAL | 0 refills | Status: DC

## 2022-12-13 MED ORDER — SUCRALFATE 1 G PO TABS
1.0000 g | ORAL_TABLET | Freq: Once | ORAL | Status: AC
  Administered 2022-12-13: 1 g via ORAL

## 2022-12-13 NOTE — ED Triage Notes (Signed)
Pt arrives with c/o ABD pain that started last night. Per pain, pain radiates into her back. Pt denies fevers. Pt endorses chills and n/v.

## 2022-12-13 NOTE — ED Provider Notes (Signed)
[place] EMERGENCY DEPARTMENT AT [facility] Provider Note   CSN: [id number] Arrival date & time: 12/13/22  1446     History  Chief Complaint  Patient presents with   Abdominal Pain    Betty Cain is a 46 y.o. female.  This is a 47 year old, otherwise healthy female who presents to the emergency department today due to abdominal pain.  Patient says that she started having abdominal pain last evening, and started having vomiting.  She has not had any diarrhea.  No history of any intra-abdominal procedures.  Does not take any medications.   Abdominal Pain      Home Medications Prior to Admission medications   Medication Sig Start Date End Date Taking? Authorizing Provider  omeprazole (PRILOSEC) 20 MG capsule Take 2 capsules (40 mg total) by mouth daily. 12/13/22 01/12/23 Yes Anders Simmonds T, DO  ondansetron (ZOFRAN) 4 MG tablet Take 1 tablet (4 mg total) by mouth every 6 (six) hours. 12/13/22  Yes Anders Simmonds T, DO  Accu-Chek Softclix Lancets lancets USE UP TO FOUR TIMES DAILY AS DIRECTED 05/31/22   Irene Limbo, FNP  acetaminophen (TYLENOL) 500 MG tablet Take 500 mg by mouth every 6 (six) hours as needed.    [provider]  aspirin EC 81 MG tablet Take 81 mg by mouth daily. Swallow whole.    [provider]  bisoprolol (ZEBETA) 5 MG tablet Take 1 tablet (5 mg total) by mouth 2 (two) times daily. 12/16/21   Duke Salvia, MD  blood glucose meter kit and supplies KIT Dispense based on patient and insurance preference. Use up to four times daily as directed. 09/02/21   Irene Limbo, FNP  Blood Pressure Monitoring DEVI 1 each by Does not apply route daily. 02/01/18   Mike Gip, FNP  cholecalciferol (VITAMIN D3) 25 MCG (1000 UNIT) tablet Take 1,000 Units by mouth daily.    [provider]  cloNIDine (CATAPRES) 0.1 MG tablet Take 1 tablet (0.1 mg total) by mouth 2 (two) times daily. 12/15/21   Duke Salvia, MD  dicyclomine  (BENTYL) 10 MG/5ML solution Take 5 mLs (10 mg total) by mouth 4 (four) times daily -  before meals and at bedtime. Patient not taking: Reported on 07/08/2022 03/25/21 04/21/22  Barbette Merino, NP  docusate sodium (COLACE) 100 MG capsule Take 1 capsule (100 mg total) by mouth 2 (two) times daily. 07/23/22   Ivonne Andrew, NP  ferrous sulfate (FEROSUL) 325 (65 FE) MG tablet TAKE 1 TABLET (325 MG TOTAL) BY MOUTH DAILY. 02/22/20   Barbette Merino, NP  fluticasone (FLONASE) 50 MCG/ACT nasal spray PLACE 2 SPRAYS INTO BOTH NOSTRILS DAILY. 06/30/22 06/30/23  Ivonne Andrew, NP  Fremanezumab-vfrm (AJOVY) 225 MG/1.5ML SOAJ Inject 225 mg into the skin every 30 (thirty) days. 07/08/22   Butch Penny, NP  gabapentin (NEURONTIN) 100 MG capsule TAKE 1 CAPSULE BY MOUTH THREE TIMES DAILY 11/19/22   Ivonne Andrew, NP  glucose blood (ACCU-CHEK GUIDE) test strip Use as instructed 02/24/22   Opalski, Gavin Pound, DO  ivabradine (CORLANOR) 5 MG TABS tablet Take 3 tablets by mouth two hours prior to cardiac CT scan. 04/28/21   Lyn Records, MD  KLOR-CON M20 20 MEQ tablet Take 1 tablet by mouth once daily 08/05/22   Ivonne Andrew, NP  levocetirizine (XYZAL ALLERGY 24HR) 5 MG tablet Take 1 tablet (5 mg total) by mouth every evening. 10/22/22   Ivonne Andrew, NP  lidocaine (LIDODERM) 5 %  Place 1 patch onto the skin daily. Remove & Discard patch within 12 hours or as directed by MD 12/10/22   Raulkar, Drema Pry, MD  meloxicam (MOBIC) 15 MG tablet Take 1 tablet (15 mg total) by mouth daily. 09/02/21   Felecia Shelling, DPM  methylcellulose (CITRUCEL) oral powder Take as directed, daily 12/26/20   Armbruster, Willaim Rayas, MD  metoCLOPramide (REGLAN) 5 MG tablet TAKE 1 TABLET BY MOUTH THREE TIMES DAILY AS NEEDED FOR NAUSEA 11/19/22   Armbruster, Willaim Rayas, MD  Multiple Vitamin (MULTI VITAMIN DAILY PO) Take by mouth.    [provider]  ondansetron (ZOFRAN-ODT) 8 MG disintegrating tablet TAKE 1 TABLET BY MOUTH EVERY 8 HOURS AS  NEEDED FOR NAUSEA OR VOMITING. 04/29/21   Barbette Merino, NP  pantoprazole (PROTONIX) 40 MG tablet Take 1 tablet (40 mg total) by mouth 2 (two) times daily before a meal. 06/01/21   Armbruster, Willaim Rayas, MD  Plecanatide (TRULANCE) 3 MG TABS Take 3 mg by mouth daily. 03/09/21   Armbruster, Willaim Rayas, MD  Rimegepant Sulfate 75 MG TBDP Take 1 tab at onset of migraine. May repeat in 2 hrs, if needed. Max dose: 2 tabs/day or 15/month. This is a 90 day rx. 04/27/21   Glean Salvo, NP  rosuvastatin (CRESTOR) 5 MG tablet TAKE 1 TABLET (5 MG TOTAL) BY MOUTH AT BEDTIME. 11/23/21 12/10/22  Ivonne Andrew, NP  Semaglutide, 1 MG/DOSE, 4 MG/3ML SOPN Inject 1 mg as directed once a week. 1 mg every 7 days 10/26/22   Julaine Fusi, NP  SPRINTEC 28 0.25-35 MG-MCG tablet Take 1 tablet by mouth once daily 06/24/22   Ivonne Andrew, NP  sucralfate (CARAFATE) 1 g tablet Take 1 tablet (1 g total) by mouth every 6 (six) hours as needed. Slowly dissolve 1 tablet in 1 tablespoon of distilled water prior to ingestion. 06/02/21   Armbruster, Willaim Rayas, MD  VENTOLIN HFA 108 (90 Base) MCG/ACT inhaler INHALE 2 PUFFS BY MOUTH EVERY 6 HOURS AS NEEDED FOR WHEEZING FOR SHORTNESS OF BREATH 07/14/22   Ivonne Andrew, NP  glipiZIDE (GLUCOTROL) 10 MG tablet Take 1 tablet (10 mg total) by mouth 2 (two) times daily before a meal. 02/21/20 06/13/20  Barbette Merino, NP      Allergies    Seasonal ic [octacosanol] and Topamax [topiramate]    Review of Systems   Review of Systems  Gastrointestinal:  Positive for abdominal pain.    Physical Exam Updated Vital Signs BP 99/70   Pulse 88   Temp 98.3 F (36.8 C) (Oral)   Resp 11   Ht 4\' 10"  (1.473 m)   Wt 57.6 kg   SpO2 98%   BMI 26.54 kg/m  Physical Exam Constitutional:      Appearance: She is well-developed.  HENT:     Head: Normocephalic.  Abdominal:     General: Abdomen is flat. There is no distension.     Palpations: Abdomen is soft.     Tenderness: There is generalized  abdominal tenderness and tenderness in the epigastric area and left lower quadrant. There is no right CVA tenderness or left CVA tenderness.  Skin:    General: Skin is warm and dry.     Findings: No rash.  Neurological:     Mental Status: She is alert.     ED Results / Procedures / Treatments   Labs (all labs ordered are listed, but only abnormal results are displayed) Labs Reviewed  COMPREHENSIVE  METABOLIC PANEL - Abnormal; Notable for the following components:      Result Value   Glucose, Bld 100 (*)    Anion gap 17 (*)    All other components within normal limits  CBC - Abnormal; Notable for the following components:   WBC 13.8 (*)    All other components within normal limits  URINALYSIS, ROUTINE W REFLEX MICROSCOPIC - Abnormal; Notable for the following components:   APPearance HAZY (*)    Hgb urine dipstick MODERATE (*)    Ketones, ur 5 (*)    Protein, ur 30 (*)    All other components within normal limits  LIPASE, BLOOD  HCG, SERUM, QUALITATIVE  MAGNESIUM    EKG EKG Interpretation Date/Time:  Monday December 13 2022 18:58:09 EDT Ventricular Rate:  93 PR Interval:  146 QRS Duration:  71 QT Interval:  419 QTC Calculation: 522 R Axis:   57  Text Interpretation: Sinus rhythm Nonspecific T abnormalities, diffuse leads Prolonged QT interval Confirmed by Anders Simmonds [phone]) on 12/13/2022 8:07:52 PM  Radiology CT ABDOMEN PELVIS W CONTRAST  Result Date: 12/13/2022 CLINICAL DATA:  Acute abdominal pain. EXAM: CT ABDOMEN AND PELVIS WITH CONTRAST TECHNIQUE: Multidetector CT imaging of the abdomen and pelvis was performed using the standard protocol following bolus administration of intravenous contrast. RADIATION DOSE REDUCTION: This exam was performed according to the departmental dose-optimization program which includes automated exposure control, adjustment of the mA and/or kV according to patient size and/or use of iterative reconstruction technique. CONTRAST:  75mL  OMNIPAQUE IOHEXOL 350 MG/ML SOLN COMPARISON:  None Available. FINDINGS: Lower chest: Lung bases are clear. Hepatobiliary: No focal hepatic lesion. Normal gallbladder. No biliary duct dilatation. Common bile duct is normal. Pancreas: Pancreas is normal. No ductal dilatation. No pancreatic inflammation. Spleen: Normal spleen Adrenals/urinary tract: Adrenal glands and kidneys are normal. The ureters and bladder normal. Stomach/Bowel: The gastric fundus and proximal body scratch the gastric fundus is normal. There is circumferential submucosal edema involving the gastric body extending to the gastric antrum. The gastric wall is thickened 2 15 mm (image 26/3) duodenum is normal. Small bowel normal. Appendix is normal. The colon and rectosigmoid colon are normal. Vascular/Lymphatic: Abdominal aorta is normal caliber. No periportal or retroperitoneal adenopathy. No pelvic adenopathy. Reproductive: Uterus and adnexa unremarkable. Other: No free fluid. Musculoskeletal: No aggressive osseous lesion. IMPRESSION: 1. Severe submucosal edema involving the entire gastric body and antrum. Findings most consistent with severe gastritis. 2. No inflammation elsewhere in the GI tract. Electronically Signed   By: Genevive Bi M.D.   On: 12/13/2022 19:11    Procedures Procedures    Medications Ordered in ED Medications  morphine (PF) 4 MG/ML injection 4 mg (4 mg Intravenous Given 12/13/22 1725)  magnesium sulfate IVPB 2 g 50 mL (0 g Intravenous Stopped 12/13/22 1828)  iohexol (OMNIPAQUE) 350 MG/ML injection 75 mL (75 mLs Intravenous Contrast Given 12/13/22 1750)  ondansetron (ZOFRAN) injection 4 mg (4 mg Intravenous Given 12/13/22 1929)  morphine (PF) 4 MG/ML injection 4 mg (4 mg Intravenous Given 12/13/22 1929)  pantoprazole (PROTONIX) injection 40 mg (40 mg Intravenous Given 12/13/22 1949)  sucralfate (CARAFATE) tablet 1 g (1 g Oral Given 12/13/22 2008)  sucralfate (CARAFATE) tablet 1 g (1 g Oral Given 12/13/22 2011)    ED  Course/ Medical Decision Making/ A&P  Medical Decision Making This is a 47 year old female who is presenting to the emergency department due to generalized abdominal pain and vomiting.  Differential diagnoses include enteritis, gastroenteritis, biliary colic, cholecystitis, nephrolithiasis, GERD, less likely ACS.  Plan-patient with a mild leukocytosis, will obtain imaging of the patient's abdomen and pelvis.  Patient has a prolonged QTc on EKG at triage.  Will provide the patient with magnesium.  Reassessment-patient CT imaging showing gastritis.  This does fit with the patient's distribution of pain, and symptoms.  Will discharge with prescriptions for Carafate, omeprazole.  She has a follow-up with GI this month.  She has been able to tolerate p.o.  QTc improved with magnesium.  Amount and/or Complexity of Data Reviewed Labs: ordered. Radiology: ordered.  Risk Prescription drug management.           Final Clinical Impression(s) / ED Diagnoses Final diagnoses:  Other acute gastritis, presence of bleeding unspecified    Rx / DC Orders ED Discharge Orders          Ordered    ondansetron (ZOFRAN) 4 MG tablet  Every 6 hours        12/13/22 2010    omeprazole (PRILOSEC) 20 MG capsule  Daily        12/13/22 2010              Anders Simmonds T, DO 12/13/22 2012

## 2022-12-13 NOTE — Discharge Instructions (Signed)
Here are the medications that I would like you to take for your gastritis.  You can take omeprazole 40 mg each day.  You should take Carafate 2 times per day.  You can take Zofran as needed for nausea.  Please follow-up with your GI doctor at your appointment in September.  Come back to the emergency department if you are unable to eat or drink.

## 2022-12-13 NOTE — ED Notes (Signed)
Pt requested more pain medication.  Doctor was notified via secure chat

## 2022-12-13 NOTE — ED Provider Notes (Incomplete)
[place] EMERGENCY DEPARTMENT AT [facility] Provider Note   CSN: [id number] Arrival date & time: 12/13/22  1446     History {Add pertinent medical, surgical, social history, OB history to HPI:1} Chief Complaint  Patient presents with   Abdominal Pain    Betty Cain is a 47 y.o. female.   Abdominal Pain      Home Medications Prior to Admission medications   Medication Sig Start Date End Date Taking? Authorizing Provider  Accu-Chek Softclix Lancets lancets USE UP TO FOUR TIMES DAILY AS DIRECTED 05/31/22   Irene Limbo, FNP  acetaminophen (TYLENOL) 500 MG tablet Take 500 mg by mouth every 6 (six) hours as needed.    [provider]  aspirin EC 81 MG tablet Take 81 mg by mouth daily. Swallow whole.    [provider]  bisoprolol (ZEBETA) 5 MG tablet Take 1 tablet (5 mg total) by mouth 2 (two) times daily. 12/16/21   Duke Salvia, MD  blood glucose meter kit and supplies KIT Dispense based on patient and insurance preference. Use up to four times daily as directed. 09/02/21   Irene Limbo, FNP  Blood Pressure Monitoring DEVI 1 each by Does not apply route daily. 02/01/18   Mike Gip, FNP  cholecalciferol (VITAMIN D3) 25 MCG (1000 UNIT) tablet Take 1,000 Units by mouth daily.    [provider]  cloNIDine (CATAPRES) 0.1 MG tablet Take 1 tablet (0.1 mg total) by mouth 2 (two) times daily. 12/15/21   Duke Salvia, MD  dicyclomine (BENTYL) 10 MG/5ML solution Take 5 mLs (10 mg total) by mouth 4 (four) times daily -  before meals and at bedtime. Patient not taking: Reported on 07/08/2022 03/25/21 04/21/22  Barbette Merino, NP  docusate sodium (COLACE) 100 MG capsule Take 1 capsule (100 mg total) by mouth 2 (two) times daily. 07/23/22   Ivonne Andrew, NP  ferrous sulfate (FEROSUL) 325 (65 FE) MG tablet TAKE 1 TABLET (325 MG TOTAL) BY MOUTH DAILY. 02/22/20   Barbette Merino, NP  fluticasone (FLONASE) 50 MCG/ACT nasal spray PLACE 2  SPRAYS INTO BOTH NOSTRILS DAILY. 06/30/22 06/30/23  Ivonne Andrew, NP  Fremanezumab-vfrm (AJOVY) 225 MG/1.5ML SOAJ Inject 225 mg into the skin every 30 (thirty) days. 07/08/22   Butch Penny, NP  gabapentin (NEURONTIN) 100 MG capsule TAKE 1 CAPSULE BY MOUTH THREE TIMES DAILY 11/19/22   Ivonne Andrew, NP  glucose blood (ACCU-CHEK GUIDE) test strip Use as instructed 02/24/22   Opalski, Gavin Pound, DO  ivabradine (CORLANOR) 5 MG TABS tablet Take 3 tablets by mouth two hours prior to cardiac CT scan. 04/28/21   Lyn Records, MD  KLOR-CON M20 20 MEQ tablet Take 1 tablet by mouth once daily 08/05/22   Ivonne Andrew, NP  levocetirizine (XYZAL ALLERGY 24HR) 5 MG tablet Take 1 tablet (5 mg total) by mouth every evening. 10/22/22   Ivonne Andrew, NP  lidocaine (LIDODERM) 5 % Place 1 patch onto the skin daily. Remove & Discard patch within 12 hours or as directed by MD 12/10/22   Raulkar, Drema Pry, MD  meloxicam (MOBIC) 15 MG tablet Take 1 tablet (15 mg total) by mouth daily. 09/02/21   Felecia Shelling, DPM  methylcellulose (CITRUCEL) oral powder Take as directed, daily 12/26/20   Armbruster, Willaim Rayas, MD  metoCLOPramide (REGLAN) 5 MG tablet TAKE 1 TABLET BY MOUTH THREE TIMES DAILY AS NEEDED FOR NAUSEA 11/19/22   Armbruster, Willaim Rayas, MD  Multiple  Vitamin (MULTI VITAMIN DAILY PO) Take by mouth.    [provider]  ondansetron (ZOFRAN-ODT) 8 MG disintegrating tablet TAKE 1 TABLET BY MOUTH EVERY 8 HOURS AS NEEDED FOR NAUSEA OR VOMITING. 04/29/21   Barbette Merino, NP  pantoprazole (PROTONIX) 40 MG tablet Take 1 tablet (40 mg total) by mouth 2 (two) times daily before a meal. 06/01/21   Armbruster, Willaim Rayas, MD  Plecanatide (TRULANCE) 3 MG TABS Take 3 mg by mouth daily. 03/09/21   Armbruster, Willaim Rayas, MD  Rimegepant Sulfate 75 MG TBDP Take 1 tab at onset of migraine. May repeat in 2 hrs, if needed. Max dose: 2 tabs/day or 15/month. This is a 90 day rx. 04/27/21   Glean Salvo, NP  rosuvastatin (CRESTOR)  5 MG tablet TAKE 1 TABLET (5 MG TOTAL) BY MOUTH AT BEDTIME. 11/23/21 12/10/22  Ivonne Andrew, NP  Semaglutide, 1 MG/DOSE, 4 MG/3ML SOPN Inject 1 mg as directed once a week. 1 mg every 7 days 10/26/22   Julaine Fusi, NP  SPRINTEC 28 0.25-35 MG-MCG tablet Take 1 tablet by mouth once daily 06/24/22   Ivonne Andrew, NP  sucralfate (CARAFATE) 1 g tablet Take 1 tablet (1 g total) by mouth every 6 (six) hours as needed. Slowly dissolve 1 tablet in 1 tablespoon of distilled water prior to ingestion. 06/02/21   Armbruster, Willaim Rayas, MD  VENTOLIN HFA 108 (90 Base) MCG/ACT inhaler INHALE 2 PUFFS BY MOUTH EVERY 6 HOURS AS NEEDED FOR WHEEZING FOR SHORTNESS OF BREATH 07/14/22   Ivonne Andrew, NP  glipiZIDE (GLUCOTROL) 10 MG tablet Take 1 tablet (10 mg total) by mouth 2 (two) times daily before a meal. 02/21/20 06/13/20  Barbette Merino, NP      Allergies    Seasonal ic [octacosanol] and Topamax [topiramate]    Review of Systems   Review of Systems  Gastrointestinal:  Positive for abdominal pain.    Physical Exam Updated Vital Signs BP 106/77 (BP Location: Right Arm)   Pulse (!) 107   Temp 98.8 F (37.1 C) (Oral)   Resp 17   Ht 4\' 10"  (1.473 m)   Wt 57.6 kg   SpO2 98%   BMI 26.54 kg/m  Physical Exam  ED Results / Procedures / Treatments   Labs (all labs ordered are listed, but only abnormal results are displayed) Labs Reviewed  URINALYSIS, ROUTINE W REFLEX MICROSCOPIC - Abnormal; Notable for the following components:      Result Value   APPearance HAZY (*)    Hgb urine dipstick MODERATE (*)    Ketones, ur 5 (*)    Protein, ur 30 (*)    All other components within normal limits  LIPASE, BLOOD  COMPREHENSIVE METABOLIC PANEL  CBC  HCG, SERUM, QUALITATIVE    EKG None  Radiology No results found.  Procedures Procedures  {Document cardiac monitor, telemetry assessment procedure when appropriate:1}  Medications Ordered in ED Medications - No data to display  ED Course/  Medical Decision Making/ A&P   {   Click here for ABCD2, HEART and other calculatorsREFRESH Note before signing :1}                              Medical Decision Making Amount and/or Complexity of Data Reviewed Labs: ordered.   ***  {Document critical care time when appropriate:1} {Document review of labs and clinical decision tools ie heart score, Chads2Vasc2 etc:1}  {  Document your independent review of radiology images, and any outside records:1} {Document your discussion with family members, caretakers, and with consultants:1} {Document social determinants of health affecting pt's care:1} {Document your decision making why or why not admission, treatments were needed:1} Final Clinical Impression(s) / ED Diagnoses Final diagnoses:  None    Rx / DC Orders ED Discharge Orders     None

## 2022-12-28 ENCOUNTER — Encounter (INDEPENDENT_AMBULATORY_CARE_PROVIDER_SITE_OTHER): Payer: Self-pay | Admitting: Adult Health

## 2022-12-28 ENCOUNTER — Ambulatory Visit (INDEPENDENT_AMBULATORY_CARE_PROVIDER_SITE_OTHER): Payer: Medicaid Other | Admitting: Adult Health

## 2022-12-28 VITALS — BP 91/68 | HR 82 | Temp 98.3°F | Ht <= 58 in | Wt 123.0 lb

## 2022-12-28 DIAGNOSIS — Z6825 Body mass index (BMI) 25.0-25.9, adult: Secondary | ICD-10-CM | POA: Diagnosis not present

## 2022-12-28 DIAGNOSIS — E1159 Type 2 diabetes mellitus with other circulatory complications: Secondary | ICD-10-CM

## 2022-12-28 DIAGNOSIS — E669 Obesity, unspecified: Secondary | ICD-10-CM

## 2022-12-28 DIAGNOSIS — I152 Hypertension secondary to endocrine disorders: Secondary | ICD-10-CM | POA: Diagnosis not present

## 2022-12-28 DIAGNOSIS — E1169 Type 2 diabetes mellitus with other specified complication: Secondary | ICD-10-CM | POA: Diagnosis not present

## 2022-12-28 MED ORDER — SEMAGLUTIDE (1 MG/DOSE) 4 MG/3ML ~~LOC~~ SOPN: 1.0000 mg | PEN_INJECTOR | SUBCUTANEOUS | 0 refills | Status: DC

## 2022-12-28 NOTE — Progress Notes (Signed)
WEIGHT SUMMARY AND BIOMETRICS  Vitals Temp: 98.3 F (36.8 C) BP: 91/68 Pulse Rate: 82 SpO2: 99 %   Anthropometric Measurements Height: 4\' 10"  (1.473 m) Weight: 123 lb (55.8 kg) BMI (Calculated): 25.71 Weight at Last Visit: 122lb Weight Lost Since Last Visit: 0 Weight Gained Since Last Visit: 1lb Starting Weight: 152lb Total Weight Loss (lbs): 29 lb (13.2 kg)   Body Composition  Body Fat %: 37.3 % Fat Mass (lbs): 46 lbs Muscle Mass (lbs): 73.2 lbs Total Body Water (lbs): 54.2 lbs Visceral Fat Rating : 7   Other Clinical Data Fasting: no Labs: no Today's Visit #: 19 Starting Date: 01/10/20    Chief Complaint:   OBESITY Betty Cain is here to discuss her progress with her obesity treatment plan. She is on the the Category 2 Plan and states she is following her eating plan approximately 40 % of the time. She states she is not currently exercising.   Interim History:  Betty Cain started Maintenance Phase on/about 10/26/2022 She has lost 28 lbs Current weight 123 lbs with corresponding BMI 25.7  She is injecting Ozempic 1mg  Q10days- tolerating well.  She was seen/treated in ED for acute Gastritis on 12/13/2022- reviewed notes/labs/imaging from encounter.  She denies any current GI upset.  Of note- [place] Interpret[facility] at [facility]  Subjective:   1. Hypertension associated with diabetes (HCC) BP soft at OV She denies sx's of hypotension She is on ivabradine (CORLANOR) 5 MG TABS tablet  rosuvastatin (CRESTOR) 5 MG tablet  cloNIDine (CATAPRES) 0.1 MG tablet  bisoprolol (ZEBETA) 5 MG tablet  aspirin EC 81 MG tablet  Semaglutide, 1 MG/DOSE, 4 MG/3ML SOPN   2. Type 2 diabetes mellitus with obesity (HCC) Lab Results  Component Value Date   HGBA1C 5.2 10/22/2022   HGBA1C 5.2 06/21/2022   HGBA1C 5.1 04/09/2022   She reports home fasting CBG 80-120s She will feel slightly nauseated when CBG in 80s She is on Ozempic 1mg  every 10 days Denies mass in neck,  dysphagia, dyspepsia, persistent hoarseness, abdominal pain, or N/V/C   Assessment/Plan:   1. Hypertension associated with diabetes (HCC) Continue current antihypertensive regime  2. Type 2 diabetes mellitus with obesity (HCC) Refill  Semaglutide, 1 MG/DOSE, 4 MG/3ML SOPN Inject 1 mg as directed once a week. 1 mg every 7 days Dispense: 9 mL, Refills: 0 ordered   3. Obesity, current BMI 25.71  Betty Cain is currently in the action stage of change. As such, her goal is to continue with weight loss efforts. She has agreed to the Category 2 Plan.   Exercise goals: All adults should avoid inactivity. Some physical activity is better than none, and adults who participate in any amount of physical activity gain some health benefits. Adults should also include muscle-strengthening activities that involve all major muscle groups on 2 or more days a week.  Behavioral modification strategies: increasing lean protein intake, decreasing simple carbohydrates, increasing vegetables, increasing water intake, meal planning and cooking strategies, and planning for success.  Betty Cain has agreed to follow-up with our clinic in 8 weeks. She was informed of the importance of frequent follow-up visits to maximize her success with intensive lifestyle modifications for her multiple health conditions.   Check Fasting Labs at next OV  Objective:   Blood pressure 91/68, pulse 82, temperature 98.3 F (36.8 C), height 4\' 10"  (1.473 m), weight 123 lb (55.8 kg), SpO2 99%. Body mass index is 25.71 kg/m.  General: Cooperative, alert, well developed, in no acute  distress. HEENT: Conjunctivae and lids unremarkable. Cardiovascular: Regular rhythm.  Lungs: Normal work of breathing. Neurologic: No focal deficits.   Lab Results  Component Value Date   CREATININE 0.60 12/13/2022   BUN 13 12/13/2022   NA 142 12/13/2022   K 3.7 12/13/2022   CL 101 12/13/2022   CO2 24 12/13/2022   Lab Results  Component Value Date   ALT  11 12/13/2022   AST 17 12/13/2022   ALKPHOS 70 12/13/2022   BILITOT 0.7 12/13/2022   Lab Results  Component Value Date   HGBA1C 5.2 10/22/2022   HGBA1C 5.2 06/21/2022   HGBA1C 5.1 04/09/2022   HGBA1C 4.9 12/28/2021   HGBA1C 5.0 08/19/2021   HGBA1C 5.0 08/19/2021   HGBA1C 5.0 (A) 08/19/2021   HGBA1C 5.0 08/19/2021   Lab Results  Component Value Date   INSULIN 7.9 06/21/2022   INSULIN 3.8 12/28/2021   INSULIN 8.7 01/10/2020   Lab Results  Component Value Date   TSH 2.530 12/28/2021   Lab Results  Component Value Date   CHOL 117 04/09/2022   HDL 44 04/09/2022   LDLCALC 58 04/09/2022   TRIG 75 04/09/2022   CHOLHDL 2.7 04/09/2022   Lab Results  Component Value Date   VD25OH 51.2 06/21/2022   VD25OH 35.6 12/28/2021   VD25OH 22.7 (L) 10/09/2021   Lab Results  Component Value Date   WBC 13.8 (H) 12/13/2022   HGB 13.7 12/13/2022   HCT 42.0 12/13/2022   MCV 90.9 12/13/2022   PLT 316 12/13/2022   Lab Results  Component Value Date   IRON 53 10/22/2022   TIBC 253 10/22/2022   FERRITIN 295 (H) 10/22/2022    Attestation Statements:   Reviewed by clinician on day of visit: allergies, medications, problem list, medical history, surgical history, family history, social history, and previous encounter notes.  I have reviewed the above documentation for accuracy and completeness, and I agree with the above. -  Clarissa Laird d. Glendon Fiser, NP-C

## 2023-01-02 ENCOUNTER — Other Ambulatory Visit: Payer: Self-pay | Admitting: Nurse Practitioner

## 2023-01-05 ENCOUNTER — Other Ambulatory Visit: Payer: Self-pay | Admitting: Nurse Practitioner

## 2023-01-05 MED ORDER — LEVOCETIRIZINE DIHYDROCHLORIDE 5 MG PO TABS: 5.0000 mg | ORAL_TABLET | Freq: Every evening | ORAL | 0 refills | Status: DC

## 2023-01-06 ENCOUNTER — Encounter: Payer: Self-pay | Admitting: Physical Medicine and Rehabilitation

## 2023-01-06 ENCOUNTER — Encounter
Payer: Medicaid Other | Attending: Physical Medicine and Rehabilitation | Admitting: Physical Medicine and Rehabilitation

## 2023-01-06 VITALS — BP 118/76 | HR 94 | Ht <= 58 in | Wt 127.0 lb

## 2023-01-06 DIAGNOSIS — M7918 Myalgia, other site: Secondary | ICD-10-CM | POA: Diagnosis not present

## 2023-01-06 MED ORDER — LIDOCAINE 5 % EX PTCH: 2.0000 | MEDICATED_PATCH | CUTANEOUS | 0 refills | Status: DC

## 2023-01-06 MED ORDER — LIDOCAINE HCL 1 % IJ SOLN
5.0000 mL | Freq: Once | INTRAMUSCULAR | Status: AC
Start: 2023-01-06 — End: 2023-01-06
  Administered 2023-01-06: 5 mL via INTRADERMAL

## 2023-01-06 NOTE — Progress Notes (Signed)

## 2023-01-06 NOTE — Addendum Note (Signed)
Addended by: Horton Chin on: 01/06/2023 11:30 AM   Modules accepted: Orders

## 2023-01-31 ENCOUNTER — Encounter (INDEPENDENT_AMBULATORY_CARE_PROVIDER_SITE_OTHER): Payer: Self-pay | Admitting: Adult Health

## 2023-02-01 ENCOUNTER — Encounter (INDEPENDENT_AMBULATORY_CARE_PROVIDER_SITE_OTHER): Payer: Self-pay | Admitting: Adult Health

## 2023-02-07 ENCOUNTER — Other Ambulatory Visit: Payer: Self-pay | Admitting: Gastroenterology

## 2023-02-07 ENCOUNTER — Other Ambulatory Visit: Payer: Self-pay | Admitting: Internal Medicine

## 2023-02-08 DIAGNOSIS — H5213 Myopia, bilateral: Secondary | ICD-10-CM | POA: Diagnosis not present

## 2023-02-08 LAB — HM DIABETES EYE EXAM

## 2023-02-09 ENCOUNTER — Encounter: Payer: Self-pay | Admitting: Adult Health

## 2023-02-09 ENCOUNTER — Ambulatory Visit: Payer: Medicaid Other | Admitting: Adult Health

## 2023-02-09 VITALS — BP 99/68 | HR 83 | Ht <= 58 in | Wt 129.0 lb

## 2023-02-09 DIAGNOSIS — G43709 Chronic migraine without aura, not intractable, without status migrainosus: Secondary | ICD-10-CM

## 2023-02-09 MED ORDER — ONDANSETRON HCL 4 MG PO TABS: 4.0000 mg | ORAL_TABLET | Freq: Three times a day (TID) | ORAL | 0 refills | Status: DC | PRN

## 2023-02-09 MED ORDER — AJOVY 225 MG/1.5ML ~~LOC~~ SOAJ: 225.0000 mg | SUBCUTANEOUS | 11 refills | Status: DC

## 2023-02-09 MED ORDER — RIMEGEPANT SULFATE 75 MG PO TBDP: ORAL_TABLET | ORAL | 3 refills | Status: AC

## 2023-02-09 NOTE — Progress Notes (Signed)
PATIENT: Betty Cain DOB: 1975/09/07  REASON FOR VISIT: follow up HISTORY FROM: patient using the interpreter services PRIMARY NEUROLOGIST: Dr. Lucia Gaskins   Chief Complaint  Patient presents with   Follow-up    Pt in 4  with interpreter on wheels Pt here for migraines  Pt states 3 migraines in last month  Pt states needing refills on migraine medications     HISTORY OF PRESENT ILLNESS: Today 02/09/23:  Betty Cain is a 47 y.o. female with a history of migraine headaches. Returns today for follow-up.  She reports overall Ajovy and Nurtec is working well for her.  Has approximately 3 migraines a month.  She tends to notice more headaches when she is due for Ajovy.  She states Nurtec will work well for her.  She does note some nausea with her headaches.  She states the ED at 1 point gave her Zofran and that worked well.  She is asking for a refill.     07/05/22: Betty Cain is a 47 y.o. female with a history of Migraine headaches. Returns today for follow-up.  1-3 times a month. Continues to take nurtec and it does help. Takes about 1-2 hours and her headache will resolve.  She states that in the last 3 months her migraines have started suddenly associated with some dizziness.  However they still respond well to Nurtec.  The frequency of her headaches have not changed.  This month she is fasting for Ramadan.  She returns today for an evaluation.   06/29/21: Betty Cain is a 47 year old female with a history of migraine headaches.  She returns today for follow-up.  Continues Ajovy and reports that it works well. Takes nurtec when she gets a headache. Reports headache will resolve after rest in addition to nurtec. Not sure of her headache frequency but knows that it's better with medication.   Patient reports that she has generalized pain all over.  Reviewing her PCP note from February it looks as a diagnosis of fibromyalgia was being considered.  The patient reports that she was told to follow-up  with neurology.   Betty Cain is a 47 year old female with a history of migraine headaches.  She is currently on Ajovy monthly injections.  She also takes Nurtec for abortive therapy.  At the last visit tizanidine was added on for abortive therapy.  Patient reports that her headaches have worsened.  She states that she has a daily headache that is typically mild.  She states 3 days out of the week it is severe.  Patient reports that she tried tizanidine but she does not remember how helpful it was.  She reports that Nurtec offers her temporary relief.  HISTORY (copied from Margie Ege): 06/19/20 Betty Cain is a 47 year old female with history of daily headaches.  Has tried Aimovig, is now on Ajovy since January. Has DM, A1C was 5.7.  Currently taking gabapentin, metoprolol, nortriptyline.  Has an allergy to Topamax. Likes Ajovy, helping more than Aimovig, migraines are improved, only 1 a month, but is more intense. It may last 5 days. Is able to continue with function, has 4 kids. Takes Nurtec, is helpful. May have nausea, but no vomiting. Is happy with how things have improved. With headaches, can have aches all over, tightness in her neck. Here with interpreter.   REVIEW OF SYSTEMS: Out of a complete 14 system review of symptoms, the patient complains only of the following symptoms, and all other reviewed systems are negative.  See HPI  ALLERGIES: Allergies  Allergen Reactions   Seasonal Ic [Octacosanol]    Topamax [Topiramate] Itching and Rash    HOME MEDICATIONS: Outpatient Medications Prior to Visit  Medication Sig Dispense Refill   Accu-Chek Softclix Lancets lancets USE UP TO FOUR TIMES DAILY AS DIRECTED 100 each 0   acetaminophen (TYLENOL) 500 MG tablet Take 500 mg by mouth every 6 (six) hours as needed.     amoxicillin-clavulanate (AUGMENTIN) 250-125 MG tablet Take 1 tablet by mouth every 8 (eight) hours.     aspirin EC 81 MG tablet Take 81 mg by mouth daily. Swallow whole.      bisoprolol (ZEBETA) 5 MG tablet Take 1 tablet by mouth twice daily 60 tablet 0   blood glucose meter kit and supplies KIT Dispense based on patient and insurance preference. Use up to four times daily as directed. 1 each 0   Blood Pressure Monitoring DEVI 1 each by Does not apply route daily. 1 Device 0   cholecalciferol (VITAMIN D3) 25 MCG (1000 UNIT) tablet Take 1,000 Units by mouth daily.     cloNIDine (CATAPRES) 0.1 MG tablet Take 1 tablet by mouth twice daily 60 tablet 0   docusate sodium (COLACE) 100 MG capsule Take 1 capsule (100 mg total) by mouth 2 (two) times daily. 10 capsule 0   ferrous sulfate (FEROSUL) 325 (65 FE) MG tablet TAKE 1 TABLET (325 MG TOTAL) BY MOUTH DAILY. 90 tablet 3   fluticasone (FLONASE) 50 MCG/ACT nasal spray PLACE 2 SPRAYS INTO BOTH NOSTRILS DAILY. 48 g 1   Fremanezumab-vfrm (AJOVY) 225 MG/1.5ML SOAJ Inject 225 mg into the skin every 30 (thirty) days. 1.5 mL 6   gabapentin (NEURONTIN) 100 MG capsule TAKE 1 CAPSULE BY MOUTH THREE TIMES DAILY 90 capsule 0   glucose blood (ACCU-CHEK GUIDE) test strip Use as instructed 100 each 12   ivabradine (CORLANOR) 5 MG TABS tablet Take 3 tablets by mouth two hours prior to cardiac CT scan. 3 tablet 0   KLOR-CON M20 20 MEQ tablet Take 1 tablet by mouth once daily 30 tablet 0   levocetirizine (XYZAL) 5 MG tablet Take 1 tablet (5 mg total) by mouth every evening. 30 tablet 0   lidocaine (LIDODERM) 5 % Place 2 patches onto the skin daily. Remove & Discard patch within 12 hours or as directed by MD 60 patch 0   meloxicam (MOBIC) 15 MG tablet Take 1 tablet (15 mg total) by mouth daily. 30 tablet 1   methylcellulose (CITRUCEL) oral powder Take as directed, daily     metoCLOPramide (REGLAN) 5 MG tablet TAKE 1 TABLET BY MOUTH THREE TIMES DAILY AS NEEDED FOR NAUSEA 90 tablet 0   Multiple Vitamin (MULTI VITAMIN DAILY PO) Take by mouth.     ondansetron (ZOFRAN) 4 MG tablet Take 1 tablet (4 mg total) by mouth every 6 (six) hours. 12 tablet 0    ondansetron (ZOFRAN-ODT) 8 MG disintegrating tablet TAKE 1 TABLET BY MOUTH EVERY 8 HOURS AS NEEDED FOR NAUSEA OR VOMITING. 30 tablet 0   pantoprazole (PROTONIX) 40 MG tablet Take 1 tablet (40 mg total) by mouth 2 (two) times daily before a meal. 60 tablet 3   Rimegepant Sulfate 75 MG TBDP Take 1 tab at onset of migraine. May repeat in 2 hrs, if needed. Max dose: 2 tabs/day or 15/month. This is a 90 day rx. 45 tablet 3   Semaglutide, 1 MG/DOSE, 4 MG/3ML SOPN Inject 1 mg as directed once a week. 1 mg every 7  days 9 mL 0   SPRINTEC 28 0.25-35 MG-MCG tablet Take 1 tablet by mouth once daily 28 tablet 0   sucralfate (CARAFATE) 1 g tablet Take 1 tablet (1 g total) by mouth every 6 (six) hours as needed. Slowly dissolve 1 tablet in 1 tablespoon of distilled water prior to ingestion. 60 tablet 2   VENTOLIN HFA 108 (90 Base) MCG/ACT inhaler INHALE 2 PUFFS BY MOUTH EVERY 6 HOURS AS NEEDED FOR WHEEZING FOR SHORTNESS OF BREATH 18 g 0   dicyclomine (BENTYL) 10 MG/5ML solution Take 5 mLs (10 mg total) by mouth 4 (four) times daily -  before meals and at bedtime. 600 mL 0   omeprazole (PRILOSEC) 20 MG capsule Take 2 capsules (40 mg total) by mouth daily. 60 capsule 0   Plecanatide (TRULANCE) 3 MG TABS Take 3 mg by mouth daily. 30 tablet 3   rosuvastatin (CRESTOR) 5 MG tablet TAKE 1 TABLET (5 MG TOTAL) BY MOUTH AT BEDTIME. 30 tablet 0   No facility-administered medications prior to visit.    PAST MEDICAL HISTORY: Past Medical History:  Diagnosis Date   Allergy    Anemia    Anxiety 01/2019   Asthma    B12 deficiency    Back pain    Common migraine with intractable migraine 06/28/2017   Constipation    Diabetes (HCC) 02/2019   Fatigue    GERD (gastroesophageal reflux disease)    pos H pylori   Hypertension    controlled with medication   IBS (irritable bowel syndrome) 2005   Joint pain    Neonatal death    Vaginal delivery, full term-lived x2 hours.    Palpitations    Shortness of breath     Shortness of breath on exertion    Spinal headache    Swelling of both lower extremities    Valvular heart disease    Vitamin D deficiency 02/2019    PAST SURGICAL HISTORY: Past Surgical History:  Procedure Laterality Date   ADENOIDECTOMY     and tonsils (as a child)   BIOPSY  08/27/2021   Procedure: BIOPSY;  Surgeon: Benancio Deeds, MD;  Location: [facility];  Service: Gastroenterology;;   boil  2003   right elbow   CESAREAN SECTION     x4   CESAREAN SECTION  06/02/2011   Procedure: CESAREAN SECTION;  Surgeon: Tilda Burrow, MD;  Location: [facility];  Service: Gynecology;  Laterality: N/A;  Primary Cesarean Section Delivery Baby Boy @ 0004, Apgars 9/9   CESAREAN SECTION N/A 12/10/2013   Procedure: REPEAT CESAREAN SECTION;  Surgeon: Catalina Antigua, MD;  Location: [facility];  Service: Obstetrics;  Laterality: N/A;   CESAREAN SECTION     colonoscopy  11/23/2018   stated had issues with sleep when in for procedure   ESOPHAGOGASTRODUODENOSCOPY (EGD) WITH PROPOFOL N/A 08/27/2021   Procedure: ESOPHAGOGASTRODUODENOSCOPY (EGD) WITH PROPOFOL;  Surgeon: Benancio Deeds, MD;  Location: [facility];  Service: Gastroenterology;  Laterality: N/A;   OTHER SURGICAL HISTORY  2005   Uterine surgery    uterine cauterization      FAMILY HISTORY: Family History  Problem Relation Age of Onset   Diabetes Mother    Diabetes Father    Diabetes Sister    Migraines Sister    Migraines Daughter    Anesthesia problems Neg Hx    Colon cancer Neg Hx    Esophageal cancer Neg Hx    Rectal cancer Neg Hx    Stomach cancer Neg  Hx    Colon polyps Neg Hx     SOCIAL HISTORY: Social History   Socioeconomic History   Marital status: Married    Spouse name: [name]   Number of children: 4   Years of education: Not on file   Highest education level: Some college, no degree  Occupational History   Occupation: Unemployed  Tobacco Use   Smoking status: Never   Smokeless tobacco: Never   Vaping Use   Vaping status: Never Used  Substance and Sexual Activity   Alcohol use: No   Drug use: No   Sexual activity: Yes    Birth control/protection: None    Comment: pregnant  Other Topics Concern   Not on file  Social History Narrative   Lives with husband and child   Caffeine use: daily (tea), sometimes coffee/soda   Right handed    Social Determinants of Health   Financial Resource Strain: Low Risk  (07/07/2022)   Overall Financial Resource Strain (CARDIA)    Difficulty of Paying Living Expenses: Not very hard  Food Insecurity: No Food Insecurity (07/07/2022)   Hunger Vital Sign    Worried About Running Out of Food in the Last Year: Never true    Ran Out of Food in the Last Year: Never true  Transportation Needs: No Transportation Needs (07/07/2022)   PRAPARE - Administrator, Civil Service (Medical): No    Lack of Transportation (Non-Medical): No  Physical Activity: Insufficiently Active (07/07/2022)   Exercise Vital Sign    Days of Exercise per Week: 2 days    Minutes of Exercise per Session: 10 min  Stress: No Stress Concern Present (07/07/2022)   [employer] of Occupational Health - Occupational Stress Questionnaire    Feeling of Stress : Not at all  Social Connections: Moderately Isolated (07/07/2022)   Social Connection and Isolation Panel [NHANES]    Frequency of Communication with Friends and Family: More than three times a week    Frequency of Social Gatherings with Friends and Family: Once a week    Attends Religious Services: Never    Database administrator or Organizations: No    Attends Engineer, structural: Not on file    Marital Status: Married  Catering manager Violence: Not on file      PHYSICAL EXAM  Vitals:   02/09/23 0944  BP: 99/68  Pulse: 83  Weight: 129 lb (58.5 kg)  Height: 4\' 10"  (1.473 m)   Body mass index is 26.96 kg/m.  Generalized: Well developed, in no acute distress   Neurological examination   Mentation: Alert oriented to time, place, history taking. Follows all commands speech and language fluent Cranial nerve II-XII: Pupils were equal round reactive to light. Extraocular movements were full, visual field were full on confrontational test. Facial sensation and strength were normal.  Head turning and shoulder shrug  were normal and symmetric. Motor: The motor testing reveals 5 over 5 strength of all 4 extremities. Good symmetric motor tone is noted throughout.  Sensory: Sensory testing is intact to soft touch on all 4 extremities. No evidence of extinction is noted.  Coordination: Cerebellar testing reveals good finger-nose-finger and heel-to-shin bilaterally.  Gait and station: Gait is normal.    DIAGNOSTIC DATA (LABS, IMAGING, TESTING) - I reviewed patient records, labs, notes, testing and imaging myself where available.  Lab Results  Component Value Date   WBC 13.8 (H) 12/13/2022   HGB 13.7 12/13/2022   HCT 42.0 12/13/2022  MCV 90.9 12/13/2022   PLT 316 12/13/2022      Component Value Date/Time   NA 142 12/13/2022 1539   NA 141 10/22/2022 0906   K 3.7 12/13/2022 1539   CL 101 12/13/2022 1539   CO2 24 12/13/2022 1539   GLUCOSE 100 (H) 12/13/2022 1539   GLUCOSE 77 09/21/2013 1230   BUN 13 12/13/2022 1539   BUN 15 10/22/2022 0906   CREATININE 0.60 12/13/2022 1539   CREATININE 0.58 09/14/2016 0939   CALCIUM 9.7 12/13/2022 1539   PROT 8.0 12/13/2022 1539   PROT 7.2 10/22/2022 0906   ALBUMIN 4.1 12/13/2022 1539   ALBUMIN 4.6 10/22/2022 0906   AST 17 12/13/2022 1539   ALT 11 12/13/2022 1539   ALKPHOS 70 12/13/2022 1539   BILITOT 0.7 12/13/2022 1539   BILITOT 0.4 10/22/2022 0906   GFRNONAA >60 12/13/2022 1539   GFRNONAA >89 09/14/2016 0939   GFRAA 126 01/02/2020 1027   GFRAA >89 09/14/2016 0939   Lab Results  Component Value Date   CHOL 117 04/09/2022   HDL 44 04/09/2022   LDLCALC 58 04/09/2022   TRIG 75 04/09/2022   CHOLHDL 2.7 04/09/2022   Lab Results   Component Value Date   HGBA1C 5.2 10/22/2022   Lab Results  Component Value Date   VITAMINB12 368 10/22/2022   Lab Results  Component Value Date   TSH 2.530 12/28/2021      ASSESSMENT AND PLAN 47 y.o. year old female  has a past medical history of Allergy, Anemia, Anxiety (01/2019), Asthma, B12 deficiency, Back pain, Common migraine with intractable migraine (06/28/2017), Constipation, Diabetes (HCC) (2019-03-15), Fatigue, GERD (gastroesophageal reflux disease), Hypertension, IBS (irritable bowel syndrome) 03-14-2004), Joint pain, Neonatal death, Palpitations, Shortness of breath, Shortness of breath on exertion, Spinal headache, Swelling of both lower extremities, Valvular heart disease, and Vitamin D deficiency (March 15, 2019). here with:  1.  Migraine headaches  -Patient will continue on Ajovy monthly injections for now. -Continue Nurtec for abortive therapy -Take Zofran 4 mg every 8 hours as needed for nausea -Advised if symptoms worsen or she develops new symptoms she should let us know -Follow-up in 1 year or sooner if needed       Butch Penny, MSN, NP-C 02/09/2023, 10:00 AM [facility] [address] [place], [place] [address] [phone]

## 2023-02-11 ENCOUNTER — Other Ambulatory Visit (HOSPITAL_BASED_OUTPATIENT_CLINIC_OR_DEPARTMENT_OTHER): Payer: Self-pay | Admitting: Nurse Practitioner

## 2023-02-11 DIAGNOSIS — Z1231 Encounter for screening mammogram for malignant neoplasm of breast: Secondary | ICD-10-CM

## 2023-02-22 ENCOUNTER — Ambulatory Visit (INDEPENDENT_AMBULATORY_CARE_PROVIDER_SITE_OTHER): Payer: Medicaid Other | Admitting: Adult Health

## 2023-02-24 ENCOUNTER — Other Ambulatory Visit: Payer: Self-pay | Admitting: Nurse Practitioner

## 2023-02-24 ENCOUNTER — Other Ambulatory Visit: Payer: Self-pay | Admitting: Adult Health

## 2023-02-24 DIAGNOSIS — E0842 Diabetes mellitus due to underlying condition with diabetic polyneuropathy: Secondary | ICD-10-CM

## 2023-02-25 NOTE — Telephone Encounter (Signed)
Please advise refill on gabapentin

## 2023-02-28 NOTE — Telephone Encounter (Signed)
Rx refilled per last office visit note, "Take Zofran 4 mg every 8 hours as needed for nausea"

## 2023-03-01 ENCOUNTER — Encounter (HOSPITAL_BASED_OUTPATIENT_CLINIC_OR_DEPARTMENT_OTHER): Payer: Self-pay | Admitting: Radiology

## 2023-03-01 ENCOUNTER — Ambulatory Visit (HOSPITAL_BASED_OUTPATIENT_CLINIC_OR_DEPARTMENT_OTHER)
Admission: RE | Admit: 2023-03-01 | Discharge: 2023-03-01 | Disposition: A | Discharge status: Home / Self Care | Payer: Medicaid Other | Source: Ambulatory Visit | Attending: Nurse Practitioner | Admitting: Nurse Practitioner

## 2023-03-01 DIAGNOSIS — Z1231 Encounter for screening mammogram for malignant neoplasm of breast: Secondary | ICD-10-CM | POA: Insufficient documentation

## 2023-03-02 ENCOUNTER — Telehealth (INDEPENDENT_AMBULATORY_CARE_PROVIDER_SITE_OTHER): Payer: Self-pay

## 2023-03-02 ENCOUNTER — Encounter (INDEPENDENT_AMBULATORY_CARE_PROVIDER_SITE_OTHER): Payer: Self-pay | Admitting: Adult Health

## 2023-03-02 ENCOUNTER — Ambulatory Visit: Payer: Medicaid Other | Admitting: Gastroenterology

## 2023-03-02 ENCOUNTER — Encounter: Payer: Self-pay | Admitting: Gastroenterology

## 2023-03-02 VITALS — BP 118/76 | HR 80 | Ht <= 58 in | Wt 127.0 lb

## 2023-03-02 DIAGNOSIS — R1013 Epigastric pain: Secondary | ICD-10-CM

## 2023-03-02 DIAGNOSIS — R109 Unspecified abdominal pain: Secondary | ICD-10-CM

## 2023-03-02 DIAGNOSIS — K5900 Constipation, unspecified: Secondary | ICD-10-CM | POA: Diagnosis not present

## 2023-03-02 MED ORDER — SUCRALFATE 1 G PO TABS: 1.0000 g | ORAL_TABLET | Freq: Four times a day (QID) | ORAL | 1 refills | Status: DC | PRN

## 2023-03-02 MED ORDER — FDGARD 25-20.75 MG PO CAPS: ORAL_CAPSULE | ORAL | 0 refills | Status: DC

## 2023-03-02 NOTE — Telephone Encounter (Signed)
Left message for patient to call me to go over a medication concern.

## 2023-03-02 NOTE — Progress Notes (Signed)
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  She has had a history of dyspepsia, abdominal pain, and bowel symptoms for some time, she has undergone an extensive evaluation. Recall that she previously had an H. Pylori IgG serology in April 2019  and was treated for that. He had recurrence of symptoms after treatment, was placed on Protonix and scheduled for an endoscopy with me in September. She had an EGD on January 04 2018. She had some mild gastritis noted, unfortunately her procedure was aborted due to significant laryngospasm and oxygen desaturation. Unfortunately given the procedure was aborted, biopsies not taken to confirm H. Pylori eradication. We stopped her Protonix and she submitted a stool study which was negative for H. Pylori. She's also had ongoing intermittent chest pains and has seen cardiology. Had prior EST which was normal and a negative nuclear stress test in 2020, and a negative cardiac CT. She was told she is not having cardiac origin of her symptoms.   In our office she has had an extensive evaluation with labs, US abdomen, CT scan abdomen, colonoscopy, none of which showed any pathology.  We repeated an upper endoscopy at the hospital since her last visit with me which did not show any concerning findings.  She has had some elevated inflammatory markers, but negative for any evidence of IBD. She tested negative for celiac disease.  He denies any fevers historically.    Recall she has been on a variety of regimens over time to include PPIs, Carafate, Bentyl, gabapentin, nortriptyline, Reglan for her upper tract symptoms.  She is also been on Linzess, Trulance, MiraLAX for her chronic constipation.  Of note she has been on Ozempic for the past 2 years and she states her symptoms predated to use.  She saw Doug Sou in our office in April for worsening constipation.  She has been placed on MiraLAX routinely and that has been  working pretty well for her constipation and no longer an active issue for her.  Otherwise, she continues to have a lot of upper tract symptoms that bother her which she states have been similar to symptoms that have bothered her in recent years.  She continues to have postprandial abdominal discomfort in her upper abdomen.  This can be "all over", sometimes can focus on the upper abdomen.  She is currently not having much of any nausea or vomiting.  However she has poor appetite and eats slowly to minimize her symptoms.  Generally her symptoms bother her after she eats, in the absence of eating they do not bother her as much.  Since I have last seen her she had he had another ultrasound of her right upper quadrant in April which was negative.  She had acute nausea vomiting in early September that led her to the ED.  She had a CT scan which showed some inflammation of her stomach concerning for acute gastritis.  She states she was given some antiemetics and her nausea and vomiting has mostly resolved.  She is back to her baseline symptoms.  I reviewed her medication list.  She has continued Reglan 3 times daily but states she feels that it is not working as well as it used to and not helping her lately.  It looks like the ED gave her a new PPI?  She is taking Protonix 40 mg twice daily as well as omeprazole 20 mg daily, we discussed that, management as below.  She is taking  Zofran on average daily.  She does state that Carafate has helped her in the past but she has not been taking it since she ran out.     Prior workup: CT abdomen pelvis 03/16/18 - normal [place] 01/15/14 - gallbladder normal EGD 01/04/18 - significant desaturation due to laryngospasm and procedure was aborted, mild gastriits, esophagus not well evaluated    Colonoscopy 11/23/18 - The perianal and digital rectal examinations were normal. - The terminal ileum appeared normal. - The left colon was extremely tortuous. - A 3 mm polyp was found  in the transverse colon. The polyp was sessile. The polyp was removed with a cold snare. Resection and retrieval were complete. - Internal hemorrhoids were found during retroflexion. - The exam was otherwise without abnormality. No obvious inflammatory changes. Of note, given the breathing pattern observed under anesthesia for this case, high suspicion for sleep Apnea. Path shows serrated polyp - repeat colonoscopy in 5 years     C diff ova/parasites/stool culture negative 01/02/21     RUQ [place] 06/19/19: IMPRESSION: 1. Negative for gallstones or biliary dilatation 2. Echogenic liver as may be seen with hepatic steatosis.   Cardiac CT 04/30/21 - IMPRESSION: 1. No evidence of CAD, CADRADS = 0. 2. Coronary calcium score of 0. This was 0 percentile for age and sex matched control. 3. Normal coronary origin with right dominance IMPRESSION: No significant incidental findings   EGD 08/27/21 - at hospital Esophagogastric landmarks were identified: the Z-line was found at 32 cm, the gastroesophageal junction was found at 32 cm and the upper extent of the gastric folds was found at 33 cm from the incisors. Findings: A 1 cm hiatal hernia was present. The exam of the esophagus was otherwise normal. The entire examined stomach was normal. Biopsies were taken with a cold forceps for Helicobacter pylori testing. The duodenal bulb and second portion of the duodenum were normal. Biopsies for histology were taken with a cold forceps for evaluation of celiac disease.  No evidence of gastric outlet obstruction or other concerning pathology in light of history of IDA and H pylori history. Biopsies taken to ensure H pylori eradication. Suspect functional disorder, dyspepsia and or gastroparesis, Ozempic could be contributing to current symptoms. Doing better on protonix twice daily and Reglan PRN  FINAL MICROSCOPIC DIAGNOSIS:   A. SMALL BOWEL, BIOPSY:  - Small intestinal mucosa with no specific histopathologic  changes  - Negative for increased intraepithelial lymphocytes or villous  architectural changes   B. STOMACH, ANTRUM, BODY, BIOPSY:  - Gastric antral and oxyntic mucosa with mild chronic gastritis  - Helicobacter pylori-like organisms are not identified on routine HE  stain   RUQ [place] 07/16/22: IMPRESSION: Unremarkable right upper quadrant ultrasound.    CT abdomen / pelvis with contrast 12/13/22: IMPRESSION: 1. Severe submucosal edema involving the entire gastric body and antrum. Findings most consistent with severe gastritis. 2. No inflammation elsewhere in the GI tract.   Past Medical History:  Diagnosis Date   Allergy    Anemia    Anxiety 01/2019   Asthma    B12 deficiency    Back pain    Common migraine with intractable migraine 06/28/2017   Constipation    Diabetes (HCC) 02/2019   Fatigue    GERD (gastroesophageal reflux disease)    pos H pylori   Hypertension    controlled with medication   IBS (irritable bowel syndrome) 2005   Joint pain    Neonatal death    Vaginal delivery,  full term-lived x2 hours.    Palpitations    Shortness of breath    Shortness of breath on exertion    Spinal headache    Swelling of both lower extremities    Valvular heart disease    Vitamin D deficiency 02/2019     Past Surgical History:  Procedure Laterality Date   ADENOIDECTOMY     and tonsils (as a child)   BIOPSY  08/27/2021   Procedure: BIOPSY;  Surgeon: Benancio Deeds, MD;  Location: [facility];  Service: Gastroenterology;;   boil  2003   right elbow   CESAREAN SECTION     x4   CESAREAN SECTION  06/02/2011   Procedure: CESAREAN SECTION;  Surgeon: Tilda Burrow, MD;  Location: [facility];  Service: Gynecology;  Laterality: N/A;  Primary Cesarean Section Delivery Baby Boy @ 0004, Apgars 9/9   CESAREAN SECTION N/A 12/10/2013   Procedure: REPEAT CESAREAN SECTION;  Surgeon: Catalina Antigua, MD;  Location: [facility];  Service: Obstetrics;  Laterality: N/A;   CESAREAN SECTION      colonoscopy  11/23/2018   stated had issues with sleep when in for procedure   ESOPHAGOGASTRODUODENOSCOPY (EGD) WITH PROPOFOL N/A 08/27/2021   Procedure: ESOPHAGOGASTRODUODENOSCOPY (EGD) WITH PROPOFOL;  Surgeon: Benancio Deeds, MD;  Location: [facility];  Service: Gastroenterology;  Laterality: N/A;   OTHER SURGICAL HISTORY  2005   Uterine surgery    uterine cauterization     Family History  Problem Relation Age of Onset   Diabetes Mother    Diabetes Father    Diabetes Sister    Migraines Sister    Migraines Daughter    Anesthesia problems Neg Hx    Colon cancer Neg Hx    Esophageal cancer Neg Hx    Rectal cancer Neg Hx    Stomach cancer Neg Hx    Colon polyps Neg Hx    Social History   Tobacco Use   Smoking status: Never   Smokeless tobacco: Never  Vaping Use   Vaping status: Never Used  Substance Use Topics   Alcohol use: No   Drug use: No   Current Outpatient Medications  Medication Sig Dispense Refill   Accu-Chek Softclix Lancets lancets USE UP TO FOUR TIMES DAILY AS DIRECTED 100 each 0   acetaminophen (TYLENOL) 500 MG tablet Take 500 mg by mouth every 6 (six) hours as needed.     amoxicillin-clavulanate (AUGMENTIN) 250-125 MG tablet Take 1 tablet by mouth every 8 (eight) hours.     aspirin EC 81 MG tablet Take 81 mg by mouth daily. Swallow whole.     bisoprolol (ZEBETA) 5 MG tablet Take 1 tablet by mouth twice daily 60 tablet 0   blood glucose meter kit and supplies KIT Dispense based on patient and insurance preference. Use up to four times daily as directed. 1 each 0   Blood Pressure Monitoring DEVI 1 each by Does not apply route daily. 1 Device 0   cholecalciferol (VITAMIN D3) 25 MCG (1000 UNIT) tablet Take 1,000 Units by mouth daily.     cloNIDine (CATAPRES) 0.1 MG tablet Take 1 tablet by mouth twice daily 60 tablet 0   docusate sodium (COLACE) 100 MG capsule Take 1 capsule (100 mg total) by mouth 2 (two) times daily. 10 capsule 0   ferrous sulfate  (FEROSUL) 325 (65 FE) MG tablet TAKE 1 TABLET (325 MG TOTAL) BY MOUTH DAILY. 90 tablet 3   fluticasone (FLONASE) 50 MCG/ACT nasal spray PLACE 2  SPRAYS INTO BOTH NOSTRILS DAILY. 48 g 1   Fremanezumab-vfrm (AJOVY) 225 MG/1.5ML SOAJ Inject 225 mg into the skin every 30 (thirty) days. 1.5 mL 11   gabapentin (NEURONTIN) 100 MG capsule TAKE 1 CAPSULE BY MOUTH THREE TIMES DAILY 90 capsule 0   glucose blood (ACCU-CHEK GUIDE) test strip Use as instructed 100 each 12   ivabradine (CORLANOR) 5 MG TABS tablet Take 3 tablets by mouth two hours prior to cardiac CT scan. 3 tablet 0   KLOR-CON M20 20 MEQ tablet Take 1 tablet by mouth once daily 30 tablet 0   levocetirizine (XYZAL) 5 MG tablet TAKE 1 TABLET BY MOUTH ONCE DAILY IN THE EVENING 30 tablet 0   lidocaine (LIDODERM) 5 % Place 2 patches onto the skin daily. Remove & Discard patch within 12 hours or as directed by MD 60 patch 0   meloxicam (MOBIC) 15 MG tablet Take 1 tablet (15 mg total) by mouth daily. 30 tablet 1   methylcellulose (CITRUCEL) oral powder Take as directed, daily     metoCLOPramide (REGLAN) 5 MG tablet TAKE 1 TABLET BY MOUTH THREE TIMES DAILY AS NEEDED FOR NAUSEA 90 tablet 0   Multiple Vitamin (MULTI VITAMIN DAILY PO) Take by mouth.     ondansetron (ZOFRAN) 4 MG tablet TAKE 1 TABLET BY MOUTH EVERY 8 HOURS AS NEEDED FOR NAUSEA FOR VOMITING 12 tablet 0   pantoprazole (PROTONIX) 40 MG tablet Take 1 tablet (40 mg total) by mouth 2 (two) times daily before a meal. 60 tablet 3   Rimegepant Sulfate 75 MG TBDP Take 1 tab at onset of migraine. May repeat in 2 hrs, if needed. Max dose: 2 tabs/day or 15/month. This is a 90 day rx. 45 tablet 3   Semaglutide, 1 MG/DOSE, 4 MG/3ML SOPN Inject 1 mg as directed once a week. 1 mg every 7 days 9 mL 0   SPRINTEC 28 0.25-35 MG-MCG tablet Take 1 tablet by mouth once daily 28 tablet 0   sucralfate (CARAFATE) 1 g tablet Take 1 tablet (1 g total) by mouth every 6 (six) hours as needed. Slowly dissolve 1 tablet in 1  tablespoon of distilled water prior to ingestion. 60 tablet 2   VENTOLIN HFA 108 (90 Base) MCG/ACT inhaler INHALE 2 PUFFS BY MOUTH EVERY 6 HOURS AS NEEDED FOR WHEEZING FOR SHORTNESS OF BREATH 18 g 0   dicyclomine (BENTYL) 10 MG/5ML solution Take 5 mLs (10 mg total) by mouth 4 (four) times daily -  before meals and at bedtime. 600 mL 0   omeprazole (PRILOSEC) 20 MG capsule Take 2 capsules (40 mg total) by mouth daily. 60 capsule 0   rosuvastatin (CRESTOR) 5 MG tablet TAKE 1 TABLET (5 MG TOTAL) BY MOUTH AT BEDTIME. 30 tablet 0   No current facility-administered medications for this visit.   Allergies  Allergen Reactions   Seasonal Ic [Octacosanol]    Topamax [Topiramate] Itching and Rash     Review of Systems: All systems reviewed and negative except where noted in HPI.   Lab Results  Component Value Date   WBC 13.8 (H) 12/13/2022   HGB 13.7 12/13/2022   HCT 42.0 12/13/2022   MCV 90.9 12/13/2022   PLT 316 12/13/2022    Lab Results  Component Value Date   NA 142 12/13/2022   CL 101 12/13/2022   K 3.7 12/13/2022   CO2 24 12/13/2022   BUN 13 12/13/2022   CREATININE 0.60 12/13/2022   GFRNONAA >60 12/13/2022   CALCIUM  9.7 12/13/2022   ALBUMIN 4.1 12/13/2022   GLUCOSE 100 (H) 12/13/2022    Lab Results  Component Value Date   LIPASE 44 12/13/2022    Lab Results  Component Value Date   IRON 53 10/22/2022   TIBC 253 10/22/2022   FERRITIN 295 (H) 10/22/2022     Physical Exam: BP 118/76   Pulse 80   Ht 4\' 10"  (1.473 m)   Wt 127 lb (57.6 kg)   BMI 26.54 kg/m  Constitutional: Pleasant,well-developed, female in no acute distress. Neurological: Alert and oriented to person place and time. Psychiatric: Normal mood and affect. Behavior is normal.   ASSESSMENT: 47 y.o. female here for assessment of the following  1. Abdominal pain, unspecified abdominal location   2. Dyspepsia   3. Constipation, unspecified constipation type    As above, chronic postprandial  abdominal discomfort/dyspepsia, follow-up for years with evaluation that has been extensive.  Recent ED visit for acute onset nausea vomiting, suspect viral gastritis caused those symptoms and she has since recovered from that.  We discussed her course at length.  At baseline she has had dyspepsia and upset stomach for years, now on Ozempic for the past 2 years.  We discussed that it can delay her gastric emptying and make functional tract disorders worse.  I do not think it is a good regimen for her to take long-term given her symptoms that seem to bother her routinely.  We have discussed this in the past but she has been hesitant to stop it.  I am going to message her primary care about this to see if there is other options to manage her diabetes.  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  Her right upper quadrant ultrasounds have not showed gallstones.  She does have reliable upper abdominal discomfort with eating, biliary colic remains in the differential.  Given chronicity of symptoms I will order HIDA scan to exclude this.    In the interim, I reviewed her medication list.  She should not be taking 2 PPIs.  Will stop her omeprazole and continue the Protonix.  She has been on Reglan lately and not helping as much as it used to, will stop it for now and give her a break of that.  She can use Zofran as needed for nausea.  I will add back Carafate to use as needed's have provided some benefit in the past.  I will give her some samples of FD guard to treat for dyspepsia and she can get that over-the-counter if it helps.  If HIDA is negative, symptoms persist despite coming off Ozempic, continues to have bothersome symptoms, may consider resuming Reglan intranasal formulation (Gimoti), while also considering neuromodulator such as Cymbalta or Remeron.  She understands and agrees with the plan, reassurance  provided otherwise given her extensive workup today.  PLAN: - HIDA scan - stop Reglan  - stop omeprazole - continue pantoprazole twice daily - continue Zofran PRN - add back carafate 1 tab every 8 hours PRN  - FD gard samples to use PRN - do not think she is tolerating Ozempic and I will reach out to her PCP about other options, would think she would feel better off this - continue Miralax for constipation, working well - consideration for Cymbalta or other neuromodulator pending course  Harlin Rain, MD [facility]

## 2023-03-02 NOTE — Patient Instructions (Signed)
You have been scheduled for a HIDA scan at [facility] Radiology Entr[facility] A on Wednesday, 03-09-23 at 9:00am. Please arrive 30 minutes prior to your scheduled appointment at  8:65HQ. Make certain not to have anything to eat or drink at least 6 hours prior to your test. Should this appointment date or time not work well for you, please call radiology scheduling at [phone].  _____________________________________________________________________ hepatobiliary (HIDA) scan is an imaging procedure used to diagnose problems in the liver, gallbladder and bile ducts. In the HIDA scan, a radioactive chemical or tracer is injected into a vein in your arm. The tracer is handled by the liver like bile. Bile is a fluid produced and excreted by your liver that helps your digestive system break down fats in the foods you eat. Bile is stored in your gallbladder and the gallbladder releases the bile when you eat a meal. A special nuclear medicine scanner (gamma camera) tracks the flow of the tracer from your liver into your gallbladder and small intestine.  During your HIDA scan  You'll be asked to change into a hospital gown before your HIDA scan begins. Your health care team will position you on a table, usually on your back. The radioactive tracer is then injected into a vein in your arm.The tracer travels through your bloodstream to your liver, where it's taken up by the bile-producing cells. The radioactive tracer travels with the bile from your liver into your gallbladder and through your bile ducts to your small intestine.You may feel some pressure while the radioactive tracer is injected into your vein. As you lie on the table, a special gamma camera is positioned over your abdomen taking pictures of the tracer as it moves through your body. The gamma camera takes pictures continually for about an hour. You'll need to keep still during the HIDA scan. This can become uncomfortable, but you may find that you can lessen  the discomfort by taking deep breaths and thinking about other things. Tell your health care team if you're uncomfortable. The radiologist will watch on a computer the progress of the radioactive tracer through your body. The HIDA scan may be stopped when the radioactive tracer is seen in the gallbladder and enters your small intestine. This typically takes about an hour. In some cases extra imaging will be performed if original images aren't satisfactory, if morphine is given to help visualize the gallbladder or if the medication CCK is given to look at the contraction of the gallbladder. This test typically takes 2 hours to complete. ________________________________________________________________________   Stop Reglan and omeprazole  Continue pantoprazole twice daily  We have sent the following medications to your pharmacy for you to pick up at your convenience: Carafate: every 8 hours as needed  We have given you samples of the following medication to take: [facility] - take as directed as needed  Thank you for entrusting me with your care and for choosing [facility], Dr. Ileene Patrick

## 2023-03-03 ENCOUNTER — Other Ambulatory Visit (INDEPENDENT_AMBULATORY_CARE_PROVIDER_SITE_OTHER): Payer: Self-pay | Admitting: Nurse Practitioner

## 2023-03-03 ENCOUNTER — Encounter: Payer: Self-pay | Admitting: Physical Medicine and Rehabilitation

## 2023-03-03 ENCOUNTER — Encounter
Payer: Medicaid Other | Attending: Physical Medicine and Rehabilitation | Admitting: Physical Medicine and Rehabilitation

## 2023-03-03 VITALS — BP 103/71 | HR 88 | Ht <= 58 in | Wt 129.6 lb

## 2023-03-03 DIAGNOSIS — M7918 Myalgia, other site: Secondary | ICD-10-CM | POA: Diagnosis not present

## 2023-03-03 DIAGNOSIS — E1140 Type 2 diabetes mellitus with diabetic neuropathy, unspecified: Secondary | ICD-10-CM

## 2023-03-03 MED ORDER — LIDOCAINE HCL 1 % IJ SOLN
3.0000 mL | Freq: Once | INTRAMUSCULAR | Status: AC
Start: 2023-03-03 — End: 2023-03-03
  Administered 2023-03-03: 3 mL

## 2023-03-03 MED ORDER — SODIUM CHLORIDE (PF) 0.9 % IJ SOLN
2.0000 mL | Freq: Once | INTRAMUSCULAR | Status: AC
  Administered 2023-03-03: 2 mL

## 2023-03-03 NOTE — Progress Notes (Signed)
Trigger Point Injection  Indication: Cervical myofascial pain not relieved by medication management and other conservative care.  Informed consent was obtained after describing risk and benefits of the procedure with the patient, this includes bleeding, bruising, infection and medication side effects.  The patient wishes to proceed and has given written consent.  The patient was placed in a seated position.  The area of pain was marked and prepped with Betadine.  It was entered with a 25-gauge 1/2 inch needle and a total of 5 mL of 1% lidocaine and normal saline was injected into a total of 4 trigger points, after negative draw back for blood.  The patient tolerated the procedure well.  Post procedure instructions were given.  

## 2023-03-03 NOTE — Telephone Encounter (Signed)
Spoke to patient and instructed her to DC her Ozempic for now because her doctor (Dr Adela Lank) is concerned it may be worsening her pre-existing GI upset.  She has an appointment coming up on 03/07/23, and I told her Orpha Bur would discuss it in more detail with her then. She acknowledged she understood.

## 2023-03-07 ENCOUNTER — Ambulatory Visit (INDEPENDENT_AMBULATORY_CARE_PROVIDER_SITE_OTHER): Payer: Medicaid Other | Admitting: Adult Health

## 2023-03-07 ENCOUNTER — Encounter (INDEPENDENT_AMBULATORY_CARE_PROVIDER_SITE_OTHER): Payer: Self-pay | Admitting: Adult Health

## 2023-03-07 VITALS — BP 91/62 | HR 77 | Temp 98.2°F | Ht <= 58 in | Wt 124.0 lb

## 2023-03-07 DIAGNOSIS — Z7985 Long-term (current) use of injectable non-insulin antidiabetic drugs: Secondary | ICD-10-CM | POA: Diagnosis not present

## 2023-03-07 DIAGNOSIS — E669 Obesity, unspecified: Secondary | ICD-10-CM

## 2023-03-07 DIAGNOSIS — E1159 Type 2 diabetes mellitus with other circulatory complications: Secondary | ICD-10-CM | POA: Diagnosis not present

## 2023-03-07 DIAGNOSIS — Z6824 Body mass index (BMI) 24.0-24.9, adult: Secondary | ICD-10-CM

## 2023-03-07 DIAGNOSIS — E559 Vitamin D deficiency, unspecified: Secondary | ICD-10-CM

## 2023-03-07 DIAGNOSIS — Z7984 Long term (current) use of oral hypoglycemic drugs: Secondary | ICD-10-CM | POA: Diagnosis not present

## 2023-03-07 DIAGNOSIS — R109 Unspecified abdominal pain: Secondary | ICD-10-CM

## 2023-03-07 DIAGNOSIS — Z6831 Body mass index (BMI) 31.0-31.9, adult: Secondary | ICD-10-CM

## 2023-03-07 DIAGNOSIS — E785 Hyperlipidemia, unspecified: Secondary | ICD-10-CM

## 2023-03-07 DIAGNOSIS — K5900 Constipation, unspecified: Secondary | ICD-10-CM | POA: Diagnosis not present

## 2023-03-07 DIAGNOSIS — I152 Hypertension secondary to endocrine disorders: Secondary | ICD-10-CM

## 2023-03-07 DIAGNOSIS — E1169 Type 2 diabetes mellitus with other specified complication: Secondary | ICD-10-CM | POA: Diagnosis not present

## 2023-03-07 MED ORDER — METFORMIN HCL ER 500 MG PO TB24: ORAL_TABLET | ORAL | 0 refills | Status: DC

## 2023-03-07 NOTE — Progress Notes (Addendum)
WEIGHT SUMMARY AND BIOMETRICS  Vitals Temp: 98.2 F (36.8 C) BP: 91/62 Pulse Rate: 77 SpO2: 97 %   Anthropometric Measurements Height: 4\' 10"  (1.473 m) Weight: 124 lb (56.2 kg) BMI (Calculated): 25.92 Weight at Last Visit: 123lb Weight Lost Since Last Visit: 0 Weight Gained Since Last Visit: 1lb Starting Weight: 152lb Total Weight Loss (lbs): 28 lb (12.7 kg)   Body Composition  Body Fat %: 37.3 % Fat Mass (lbs): 46.4 lbs Muscle Mass (lbs): 74.2 lbs Total Body Water (lbs): 53.8 lbs Visceral Fat Rating : 7   Other Clinical Data Fasting: yes Labs: yes Today's Visit #: 40 Starting Date: 01/10/20    Chief Complaint:   OBESITY Betty Cain is here to discuss her progress with her obesity treatment plan. She is on the the Category 2 Plan and states she is following her eating plan approximately 50 % of the time.  She states she is exercising NEAT Activities.   Interim History:  03/02/2023 OV with Dr. Armbruster/GI Recommended to suspend GLP-1 therapy over concerns of gastroparesis   She stopped Ozempic 1mg - last dose 02/24/2023  Betty Cain started Maintenance Phase on/about 10/26/2022 She has lost 27 lbs Current weight 124 lbs with corresponding BMI 25.92  Subjective:   1. Abdominal pain, unspecified abdominal location 03/02/2023 GI/Dr. Adela Lank OV Notes PLAN: - HIDA scan - stop Reglan  - stop omeprazole - continue pantoprazole twice daily - continue Zofran PRN - add back carafate 1 tab every 8 hours PRN  - FD gard samples to use PRN - do not think she is tolerating Ozempic and I will reach out to her PCP about other options, would think she would feel better off this - continue Miralax for constipation, working well - consideration for Cymbalta or other neuromodulator pending course  2. Hypertension associated with diabetes (HCC) BP soft at OV She denies sx's of hypotension She is on  ivabradine (CORLANOR) 5 MG TABS tablet  aspirin EC 81 MG tablet   bisoprolol (ZEBETA) 5 MG tablet  cloNIDine (CATAPRES) 0.1 MG tablet  metFORMIN (GLUCOPHAGE-XR) 500 MG 24 hr tablet   3. Type 2 diabetes mellitus with obesity (HCC) She stopped Ozempic 1mg - last dose 02/24/2023 She was taking GLP-1 every 10 days. GLP-1 therapy suspended over concerns of worsening gastroparesis  She was previously on Meformin or Metformin XR, she tolerated the XR formulation well. She is agreeable to remain off Ozempic and restart Metformin XR 500mg  once daily  She reports fasting CBG will range from mid 80s to 110s She denies sx's of hypoglycemia  4. Constipation, unspecified constipation type She denies current abdominal pain or hematochezia  IBS treated with methylcellulose (CITRUCEL) oral powder  pantoprazole (PROTONIX) 40 MG tablet  docusate sodium (COLACE) 100 MG capsule  ondansetron (ZOFRAN) 4 MG tablet  sucralfate (CARAFATE) 1 g tablet   5. Vitamin D deficiency She is on cholecalciferol (VITAMIN D3) 25 MCG (1000 UNIT) tablet  She endorses stable energy levels  6. Hyperlipidemia associated with type 2 diabetes mellitus (HCC)  Crestor 5mg ?  Lipid Panel     Component Value Date/Time   CHOL 117 04/09/2022 1041   TRIG 75 04/09/2022 1041   HDL 44 04/09/2022 1041   CHOLHDL 2.7 04/09/2022 1041   CHOLHDL 5.5 10/08/2014 1121   VLDL 21 10/08/2014 1121   LDLCALC 58 04/09/2022 1041   LABVLDL 15 04/09/2022 1041      Assessment/Plan:   1. Abdominal pain, unspecified abdominal location Remain off Ozempic therapy F/u with  GI as directed  2. Hypertension associated with diabetes (HCC) Check Labs - Comprehensive metabolic panel  3. Type 2 diabetes mellitus with obesity (HCC) Check Labs - Hemoglobin A1c - Insulin, random  4. Constipation, unspecified constipation type Increase water intake  5. Vitamin D deficiency Check Labs - VITAMIN D 25 Hydroxy (Vit-D Deficiency, Fractures)  6. Hyperlipidemia associated with type 2 diabetes mellitus  (HCC) Check Labs - Lipid panel  7. Obesity, current BMI 25.92  Betty Cain is currently in the action stage of change. As such, her goal is to maintain weight for now. She has agreed to the Category 2 Plan.   Exercise goals: All adults should avoid inactivity. Some physical activity is better than none, and adults who participate in any amount of physical activity gain some health benefits. Adults should also include muscle-strengthening activities that involve all major muscle groups on 2 or more days a week.  Behavioral modification strategies: increasing lean protein intake, decreasing simple carbohydrates, increasing vegetables, increasing water intake, keeping healthy foods in the home, ways to avoid boredom eating, ways to avoid night time snacking, and planning for success.  Betty Cain has agreed to follow-up with our clinic in 4 weeks. She was informed of the importance of frequent follow-up visits to maximize her success with intensive lifestyle modifications for her multiple health conditions.   Betty Cain was informed we would discuss her lab results at her next visit unless there is a critical issue that needs to be addressed sooner. Betty Cain agreed to keep her next visit at the agreed upon time to discuss these results.  Objective:   Blood pressure 91/62, pulse 77, temperature 98.2 F (36.8 C), height 4\' 10"  (1.473 m), weight 124 lb (56.2 kg), SpO2 97%. Body mass index is 25.92 kg/m.  General: Cooperative, alert, well developed, in no acute distress. HEENT: Conjunctivae and lids unremarkable. Cardiovascular: Regular rhythm.  Lungs: Normal work of breathing. Neurologic: No focal deficits.   Lab Results  Component Value Date   CREATININE 0.60 12/13/2022   BUN 13 12/13/2022   NA 142 12/13/2022   K 3.7 12/13/2022   CL 101 12/13/2022   CO2 24 12/13/2022   Lab Results  Component Value Date   ALT 11 12/13/2022   AST 17 12/13/2022   ALKPHOS 70 12/13/2022   BILITOT 0.7 12/13/2022   Lab  Results  Component Value Date   HGBA1C 5.2 10/22/2022   HGBA1C 5.2 06/21/2022   HGBA1C 5.1 04/09/2022   HGBA1C 4.9 12/28/2021   HGBA1C 5.0 08/19/2021   HGBA1C 5.0 08/19/2021   HGBA1C 5.0 (A) 08/19/2021   HGBA1C 5.0 08/19/2021   Lab Results  Component Value Date   INSULIN 7.9 06/21/2022   INSULIN 3.8 12/28/2021   INSULIN 8.7 01/10/2020   Lab Results  Component Value Date   TSH 2.530 12/28/2021   Lab Results  Component Value Date   CHOL 117 04/09/2022   HDL 44 04/09/2022   LDLCALC 58 04/09/2022   TRIG 75 04/09/2022   CHOLHDL 2.7 04/09/2022   Lab Results  Component Value Date   VD25OH 51.2 06/21/2022   VD25OH 35.6 12/28/2021   VD25OH 22.7 (L) 10/09/2021   Lab Results  Component Value Date   WBC 13.8 (H) 12/13/2022   HGB 13.7 12/13/2022   HCT 42.0 12/13/2022   MCV 90.9 12/13/2022   PLT 316 12/13/2022   Lab Results  Component Value Date   IRON 53 10/22/2022   TIBC 253 10/22/2022   FERRITIN 295 (H) 10/22/2022  Attestation Statements:   Reviewed by clinician on day of visit: allergies, medications, problem list, medical history, surgical history, family history, social history, and previous encounter notes.  I have reviewed the above documentation for accuracy and completeness, and I agree with the above. -  Jamiere Gulas d. Ariane Ditullio, NP-C

## 2023-03-08 LAB — COMPREHENSIVE METABOLIC PANEL
ALT: 15 [IU]/L (ref 0–32)
AST: 18 [IU]/L (ref 0–40)
Albumin: 4.7 g/dL (ref 3.9–4.9)
Alkaline Phosphatase: 82 [IU]/L (ref 44–121)
BUN/Creatinine Ratio: 30 — ABNORMAL HIGH (ref 9–23)
BUN: 17 mg/dL (ref 6–24)
Bilirubin Total: 0.5 mg/dL (ref 0.0–1.2)
CO2: 21 mmol/L (ref 20–29)
Calcium: 9.4 mg/dL (ref 8.7–10.2)
Chloride: 105 mmol/L (ref 96–106)
Creatinine, Ser: 0.56 mg/dL — ABNORMAL LOW (ref 0.57–1.00)
Globulin, Total: 2.4 g/dL (ref 1.5–4.5)
Glucose: 78 mg/dL (ref 70–99)
Potassium: 4.6 mmol/L (ref 3.5–5.2)
Sodium: 142 mmol/L (ref 134–144)
Total Protein: 7.1 g/dL (ref 6.0–8.5)
eGFR: 115 mL/min/{1.73_m2} (ref >59)

## 2023-03-08 LAB — VITAMIN D 25 HYDROXY (VIT D DEFICIENCY, FRACTURES): Vit D, 25-Hydroxy: 41 ng/mL (ref 30.0–100.0)

## 2023-03-08 LAB — LIPID PANEL
Chol/HDL Ratio: 3.4 {ratio} (ref 0.0–4.4)
Cholesterol, Total: 144 mg/dL (ref 100–199)
HDL: 42 mg/dL (ref >39)
LDL Chol Calc (NIH): 89 mg/dL (ref 0–99)
Triglycerides: 66 mg/dL (ref 0–149)
VLDL Cholesterol Cal: 13 mg/dL (ref 5–40)

## 2023-03-08 LAB — INSULIN, RANDOM: INSULIN: 6.5 u[IU]/mL (ref 2.6–24.9)

## 2023-03-08 LAB — HEMOGLOBIN A1C
Est. average glucose Bld gHb Est-mCnc: 105 mg/dL
Hgb A1c MFr Bld: 5.3 % (ref 4.8–5.6)

## 2023-03-09 ENCOUNTER — Ambulatory Visit (HOSPITAL_COMMUNITY)
Admission: RE | Admit: 2023-03-09 | Discharge: 2023-03-09 | Disposition: A | Discharge status: Home / Self Care | Payer: Medicaid Other | Source: Ambulatory Visit | Attending: Gastroenterology | Admitting: Gastroenterology

## 2023-03-09 DIAGNOSIS — R109 Unspecified abdominal pain: Secondary | ICD-10-CM | POA: Insufficient documentation

## 2023-03-09 MED ORDER — TECHNETIUM TC 99M MEBROFENIN IV KIT
5.0000 | PACK | Freq: Once | INTRAVENOUS | Status: AC | PRN
  Administered 2023-03-09: 5 via INTRAVENOUS

## 2023-03-13 ENCOUNTER — Other Ambulatory Visit: Payer: Self-pay | Admitting: Adult Health

## 2023-03-14 ENCOUNTER — Other Ambulatory Visit: Payer: Self-pay | Admitting: *Deleted

## 2023-03-14 ENCOUNTER — Other Ambulatory Visit: Payer: Self-pay | Admitting: Physical Medicine and Rehabilitation

## 2023-03-14 MED ORDER — ONDANSETRON HCL 4 MG PO TABS: 4.0000 mg | ORAL_TABLET | Freq: Three times a day (TID) | ORAL | 2 refills | Status: DC | PRN

## 2023-03-16 ENCOUNTER — Ambulatory Visit: Payer: Medicaid Other | Admitting: Internal Medicine

## 2023-03-21 ENCOUNTER — Encounter: Payer: Self-pay | Admitting: Nurse Practitioner

## 2023-03-21 NOTE — Telephone Encounter (Signed)
 Care team updated and letter sent for eye exam notes.

## 2023-03-23 ENCOUNTER — Other Ambulatory Visit (INDEPENDENT_AMBULATORY_CARE_PROVIDER_SITE_OTHER): Payer: Self-pay | Admitting: Adult Health

## 2023-03-23 ENCOUNTER — Ambulatory Visit: Payer: Medicaid Other | Attending: Internal Medicine | Admitting: Internal Medicine

## 2023-03-23 ENCOUNTER — Encounter: Payer: Self-pay | Admitting: Internal Medicine

## 2023-03-23 VITALS — BP 100/66 | HR 87 | Ht <= 58 in | Wt 128.6 lb

## 2023-03-23 DIAGNOSIS — R000 Tachycardia, unspecified: Secondary | ICD-10-CM | POA: Diagnosis not present

## 2023-03-23 DIAGNOSIS — R002 Palpitations: Secondary | ICD-10-CM | POA: Diagnosis not present

## 2023-03-23 DIAGNOSIS — I9589 Other hypotension: Secondary | ICD-10-CM

## 2023-03-23 NOTE — Patient Instructions (Signed)
Medication Instructions:  Your physician recommends that you continue on your current medications as directed. Please refer to the Current Medication list given to you today.  *If you need a refill on your cardiac medications before your next appointment, please call your pharmacy*   Lab Work: None ordered.  If you have labs (blood work) drawn today and your tests are completely normal, you will receive your results only by: MyChart Message (if you have MyChart) OR A paper copy in the mail If you have any lab test that is abnormal or we need to change your treatment, we will call you to review the results.   Testing/Procedures: None ordered.    Follow-Up: At [facility], you and your health needs are our priority.  As part of our continuing mission to provide you with exceptional heart care, we have created designated Provider Care Teams.  These Care Teams include your primary Cardiologist (physician) and Advanced Practice Providers (APPs -  Physician Assistants and Nurse Practitioners) who all work together to provide you with the care you need, when you need it.  We recommend signing up for the patient portal called "MyChart".  Sign up information is provided on this After Visit Summary.  MyChart is used to connect with patients for Virtual Visits (Telemedicine).  Patients are able to view lab/test results, encounter notes, upcoming appointments, etc.  Non-urgent messages can be sent to your provider as well.   To learn more about what you can do with MyChart, go to [email or web address].    Your next appointment:   6 months with Dr Klein 

## 2023-03-23 NOTE — Progress Notes (Signed)
Patient Care Team: Ivonne Andrew, NP as PCP - General (Pulmonary Disease) [facility]   HPI  Betty Cain is a 47 y.o. female seen in follow-up for palpitations off and exertional occurred in the context of longstanding diabetes.  No evidence of orthostatic intolerance at that visit, this not withstanding very rapid increases in heart rate with activity.  And her monitor showing about 20% of the beats faster than 100 (   We discontinued metoprolol and try her on bisoprolol and low-dose clonidine  Then notable for some low blood pressures in the bisoprolol was decreased; compression advised   She remains significantly better w2ith less palpitations and dizziness.  Still with shortness of breath.  Even at 3 to 5 minutes of walking but she has been largely sedentary for a number of years now.  Interestingly, she says her breathing is worse when she is sitting and when she is walking.   Continues also with atypical chest pain    DATE TEST EF    6/15 Echo   65 %    1/20 Myoview  65% No ischemia  1/23 CTA   CaScore 0    Date Cr K Hgb  6/23 0.54 4.5 13.1   11/24 0.56 4.6 13.7        Records and Results Reviewed   Past Medical History:  Diagnosis Date   Allergy    Anemia    Anxiety 01/2019   Asthma    B12 deficiency    Back pain    Common migraine with intractable migraine 06/28/2017   Constipation    Diabetes (HCC) 02/2019   Fatigue    GERD (gastroesophageal reflux disease)    pos H pylori   Hypertension    controlled with medication   IBS (irritable bowel syndrome) 2005   Joint pain    Neonatal death    Vaginal delivery, full term-lived x2 hours.    Palpitations    Shortness of breath    Shortness of breath on exertion    Spinal headache    Swelling of both lower extremities    Valvular heart disease    Vitamin D deficiency 02/2019    Past Surgical History:  Procedure Laterality Date   ADENOIDECTOMY     and tonsils (as a child)    BIOPSY  08/27/2021   Procedure: BIOPSY;  Surgeon: Benancio Deeds, MD;  Location: [facility];  Service: Gastroenterology;;   boil  2003   right elbow   CESAREAN SECTION     x4   CESAREAN SECTION  06/02/2011   Procedure: CESAREAN SECTION;  Surgeon: Tilda Burrow, MD;  Location: [facility];  Service: Gynecology;  Laterality: N/A;  Primary Cesarean Section Delivery Baby Boy @ 0004, Apgars 9/9   CESAREAN SECTION N/A 12/10/2013   Procedure: REPEAT CESAREAN SECTION;  Surgeon: Catalina Antigua, MD;  Location: [facility];  Service: Obstetrics;  Laterality: N/A;   CESAREAN SECTION     colonoscopy  11/23/2018   stated had issues with sleep when in for procedure   ESOPHAGOGASTRODUODENOSCOPY (EGD) WITH PROPOFOL N/A 08/27/2021   Procedure: ESOPHAGOGASTRODUODENOSCOPY (EGD) WITH PROPOFOL;  Surgeon: Benancio Deeds, MD;  Location: [facility];  Service: Gastroenterology;  Laterality: N/A;   OTHER SURGICAL HISTORY  2005   Uterine surgery    uterine cauterization      Current Meds  Medication Sig   Accu-Chek Softclix Lancets lancets USE UP TO FOUR TIMES DAILY AS DIRECTED  acetaminophen (TYLENOL) 500 MG tablet Take 500 mg by mouth every 6 (six) hours as needed.   amoxicillin-clavulanate (AUGMENTIN) 250-125 MG tablet Take 1 tablet by mouth every 8 (eight) hours.   aspirin EC 81 MG tablet Take 81 mg by mouth daily. Swallow whole.   bisoprolol (ZEBETA) 5 MG tablet Take 1 tablet by mouth twice daily   blood glucose meter kit and supplies KIT Dispense based on patient and insurance preference. Use up to four times daily as directed.   Blood Pressure Monitoring DEVI 1 each by Does not apply route daily.   Caraway Oil-Levomenthol (FDGARD) 25-20.75 MG CAPS Use as directed   cholecalciferol (VITAMIN D3) 25 MCG (1000 UNIT) tablet Take 1,000 Units by mouth daily.   cloNIDine (CATAPRES) 0.1 MG tablet Take 1 tablet by mouth twice daily   docusate sodium (COLACE) 100 MG capsule Take 1 capsule (100 mg total) by  mouth 2 (two) times daily.   ferrous sulfate (FEROSUL) 325 (65 FE) MG tablet TAKE 1 TABLET (325 MG TOTAL) BY MOUTH DAILY.   fluticasone (FLONASE) 50 MCG/ACT nasal spray PLACE 2 SPRAYS INTO BOTH NOSTRILS DAILY.   Fremanezumab-vfrm (AJOVY) 225 MG/1.5ML SOAJ Inject 225 mg into the skin every 30 (thirty) days.   gabapentin (NEURONTIN) 100 MG capsule TAKE 1 CAPSULE BY MOUTH THREE TIMES DAILY   glucose blood (ACCU-CHEK GUIDE) test strip Use as instructed   ivabradine (CORLANOR) 5 MG TABS tablet Take 3 tablets by mouth two hours prior to cardiac CT scan.   KLOR-CON M20 20 MEQ tablet Take 1 tablet by mouth once daily   levocetirizine (XYZAL) 5 MG tablet TAKE 1 TABLET BY MOUTH ONCE DAILY IN THE EVENING   lidocaine (LIDODERM) 5 % USE 1 PATCH EXTERNALLY ONCE DAILY REMOVE  AND  DISCARD  PATCH  WITHIN  12  HOURS  OR  AS  DIRECTED  BY  MD   metFORMIN (GLUCOPHAGE-XR) 500 MG 24 hr tablet Take 1 tab daily with largest meal   methylcellulose (CITRUCEL) oral powder Take as directed, daily   Multiple Vitamin (MULTI VITAMIN DAILY PO) Take by mouth.   ondansetron (ZOFRAN) 4 MG tablet Take 1 tablet (4 mg total) by mouth every 8 (eight) hours as needed for nausea or vomiting.   pantoprazole (PROTONIX) 40 MG tablet Take 1 tablet (40 mg total) by mouth 2 (two) times daily before a meal.   Rimegepant Sulfate 75 MG TBDP Take 1 tab at onset of migraine. May repeat in 2 hrs, if needed. Max dose: 2 tabs/day or 15/month. This is a 90 day rx.   SPRINTEC 28 0.25-35 MG-MCG tablet Take 1 tablet by mouth once daily   sucralfate (CARAFATE) 1 g tablet Take 1 tablet (1 g total) by mouth every 6 (six) hours as needed. Slowly dissolve 1 tablet in 1 tablespoon of distilled water prior to ingestion.   VENTOLIN HFA 108 (90 Base) MCG/ACT inhaler INHALE 2 PUFFS BY MOUTH EVERY 6 HOURS AS NEEDED FOR WHEEZING FOR SHORTNESS OF BREATH    Allergies  Allergen Reactions   Seasonal Ic [Octacosanol]    Topamax [Topiramate] Itching and Rash       Review of Systems negative except from HPI and PMH  Physical Exam BP 100/66   Pulse 87   Ht 4\' 10"  (1.473 m)   Wt 128 lb 9.6 oz (58.3 kg)   SpO2 96%   BMI 26.88 kg/m  Well developed and nourished in no acute distress HENT normal Neck   supple No Clubbing  cyanosis edema Skin-warm and dry A & Oriented  Grossly normal sensory and motor function  ECG sinus at 81 Interval 16/08/38 Nonspecific T wave flattening  Estimated Creatinine Clearance: 67.2 mL/min (A) (by C-G formula based on SCr of 0.56 mg/dL (L)).   Assessment and  Plan Palpitations   Hypotension ?iatrogenic   Exertional tachycardia   Diabetes ? Neuropathy   Chest pain   GERD  Overall is better.  Still with significant exercise intolerance.  Continuing with the bisoprolol for antiadrenergic effects and the clonidine for central sympatholysis.  We will begin gradual exercising 3 minutes 4 times a day increasing by half a minute every week.  Will plan to see her in 6 months  Dyspnea worse while seated compared to walking did not give me any clues.  I will reach out to pulmonary, I wonder whether there is something related to relative abdominal compression of the chest cavity associated with sitting.  This would be relieved both by the horizontal as well as being vertical.  But not consistent with platypnea orthodeoxia

## 2023-03-30 ENCOUNTER — Ambulatory Visit: Payer: Medicaid Other | Attending: Physical Medicine and Rehabilitation

## 2023-03-30 ENCOUNTER — Other Ambulatory Visit: Payer: Self-pay

## 2023-03-30 DIAGNOSIS — M546 Pain in thoracic spine: Secondary | ICD-10-CM | POA: Diagnosis not present

## 2023-03-30 DIAGNOSIS — M542 Cervicalgia: Secondary | ICD-10-CM | POA: Diagnosis not present

## 2023-03-30 DIAGNOSIS — M7918 Myalgia, other site: Secondary | ICD-10-CM | POA: Insufficient documentation

## 2023-03-30 DIAGNOSIS — H524 Presbyopia: Secondary | ICD-10-CM | POA: Diagnosis not present

## 2023-03-30 DIAGNOSIS — M6281 Muscle weakness (generalized): Secondary | ICD-10-CM | POA: Diagnosis not present

## 2023-03-30 NOTE — Therapy (Addendum)
OUTPATIENT PHYSICAL THERAPY CERVICAL EVALUATION   Patient Name: Betty Cain MRN: [id number] DOB:March 13, 1976, 47 y.o., female Today's Date: 03/30/2023  END OF SESSION:   Past Medical History:  Diagnosis Date   Allergy    Anemia    Anxiety 01/2019   Asthma    B12 deficiency    Back pain    Common migraine with intractable migraine 06/28/2017   Constipation    Diabetes (HCC) 02/2019   Fatigue    GERD (gastroesophageal reflux disease)    pos H pylori   Hypertension    controlled with medication   IBS (irritable bowel syndrome) 2005   Joint pain    Neonatal death    Vaginal delivery, full term-lived x2 hours.    Palpitations    Shortness of breath    Shortness of breath on exertion    Spinal headache    Swelling of both lower extremities    Valvular heart disease    Vitamin D deficiency 02/2019   Past Surgical History:  Procedure Laterality Date   ADENOIDECTOMY     and tonsils (as a child)   BIOPSY  08/27/2021   Procedure: BIOPSY;  Surgeon: Benancio Deeds, MD;  Location: [facility];  Service: Gastroenterology;;   boil  2003   right elbow   CESAREAN SECTION     x4   CESAREAN SECTION  06/02/2011   Procedure: CESAREAN SECTION;  Surgeon: Tilda Burrow, MD;  Location: [facility];  Service: Gynecology;  Laterality: N/A;  Primary Cesarean Section Delivery Baby Boy @ 0004, Apgars 9/9   CESAREAN SECTION N/A 12/10/2013   Procedure: REPEAT CESAREAN SECTION;  Surgeon: Catalina Antigua, MD;  Location: [facility];  Service: Obstetrics;  Laterality: N/A;   CESAREAN SECTION     colonoscopy  11/23/2018   stated had issues with sleep when in for procedure   ESOPHAGOGASTRODUODENOSCOPY (EGD) WITH PROPOFOL N/A 08/27/2021   Procedure: ESOPHAGOGASTRODUODENOSCOPY (EGD) WITH PROPOFOL;  Surgeon: Benancio Deeds, MD;  Location: [facility];  Service: Gastroenterology;  Laterality: N/A;   OTHER SURGICAL HISTORY  2005   Uterine surgery    uterine cauterization     Patient Active Problem  List   Diagnosis Date Noted   Dislocation of jaw 07/23/2022   Small vessel disease (HCC) 06/29/2022   Type 2 diabetes mellitus with obesity (HCC) 03/25/2022   Hypotension - iatrogenic 03/18/2022   Other chronic pain 02/02/2022   Dyspepsia    Early satiety    IDA (iron deficiency anemia) 01/29/2021   Paroxysmal supraventricular tachycardia (HCC) 10/01/2020   Class 1 obesity with serious comorbidity and body mass index (BMI) of 31.0 to 31.9 in adult 10/01/2020   Diabetes mellitus (HCC) 02/14/2020   Mixed diabetic hyperlipidemia associated with type 2 diabetes mellitus (HCC) 01/10/2020   Hypertension associated with diabetes (HCC) 01/10/2020   Diabetic neuropathy (HCC) 01/10/2020   Vitamin D deficiency 01/10/2020   Diabetes mellitus with proteinuria (HCC) 08/20/2019   History of Helicobacter pylori infection 08/08/2019   Proteinuria 06/14/2019   Elevated sed rate 05/23/2019   Elevated C-reactive protein (CRP) 05/23/2019   Diabetes mellitus without complication (HCC) 05/23/2019   Dyslipidemia (high LDL; low HDL) 05/23/2019   Type 2 diabetes mellitus with hyperglycemia, without long-term current use of insulin (HCC) 02/17/2019   Hemoglobin A1C between 7% and 9% indicating borderline diabetic control (HCC) 02/17/2019   Breast cancer screening by mammogram 02/17/2019   Frequent headaches 02/14/2019   Anxiety 02/14/2019   Common migraine with intractable migraine 06/28/2017  Chest pain at rest 10/17/2016   Sinus tachycardia 08/18/2016   Prolonged Q-T interval on ECG 08/18/2016   Vertigo 07/22/2015   Abdominal discomfort 02/14/2015   Low back pain 02/14/2015   Abdominal pain 02/01/2015   Nausea without vomiting 02/01/2015   Environmental allergies 02/01/2015   Language barrier to communication 02/01/2015   Anemia 01/23/2015   Constipation 01/21/2015   Knee pain, bilateral 01/21/2015   Palpitations 09/18/2013   Shortness of breath 09/18/2013   Advanced maternal age in pregnancy in  second trimester 06/19/2013   IBS (irritable bowel syndrome) 04/13/2003    PCP: Ivonne Andrew, NP  REFERRING PROVIDER: Horton Chin, MD  REFERRING DIAG: 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 She is returning today following trigger point injections, which was last administered on 03/03/23. She reports that she is having she is have neck pain and shoulder pain on both sides. She states that this pain is different that the last time she was evaluated. However, she reports that the dizziness has started to return over the past couple of months.    Hand dominance: Right  PERTINENT HISTORY:  Relevant PMHx includes anemia, anxiety, B12 deficiency, DM, HTN, IBS, DOE, tachycardia, diabetic neuropathy   PAIN:  Are you having pain?  Yes: NPRS scale: 8/10 Pain location: BIL neck/shoulder  Pain description: "pain"  Aggravating factors: looking over her shoulder, looking up to the ceiling, turning head quickly  Relieving factors: trigger point injections (a little improvement), hot pack, pain killer    PRECAUTIONS: None  RED FLAGS: None     WEIGHT BEARING RESTRICTIONS: No  FALLS:  Has patient fallen in last 6 months? No  LIVING ENVIRONMENT: Lives with: lives with their family Stairs: No  OCCUPATION: Homemaking  PLOF: Independent  PATIENT GOALS: To get rid of the pain completely  NEXT MD VISIT: 04/21/2023  OBJECTIVE:  Note: Objective measures were completed at Evaluation  unless otherwise noted.  DIAGNOSTIC FINDINGS:  04/09/2022 Thoracic Spine -- Negative   04/09/2022 Right shoulder -- Negative   PATIENT SURVEYS:  FOTO 47 current, 57 predicted   COGNITION: Overall cognitive status: Within functional limits for tasks assessed  SENSATION: Not tested  POSTURE: forward head  CERVICAL ROM:   Active ROM A/PROM (deg) eval  Flexion WFL  Extension WFL, p!  Right lateral flexion WFL  Left lateral flexion WFL , P!  Right rotation WFL  Left rotation WFL   (Blank rows = not tested)  UPPER EXTREMITY ROM:  Active ROM Right eval Left eval  Shoulder flexion [facility]   Shoulder extension    Shoulder abduction WFL, p! [facility]  Shoulder adduction    Shoulder extension    Shoulder internal rotation    Shoulder external rotation    Elbow flexion    Elbow extension    Wrist flexion    Wrist extension    Wrist ulnar deviation    Wrist radial deviation    Wrist pronation    Wrist supination     (Blank rows = not tested)  UPPER EXTREMITY MMT:  MMT Right eval Left eval  Shoulder flexion 4- 4-  Shoulder extension    Shoulder abduction 4- 4-  Shoulder adduction    Shoulder extension    Shoulder internal rotation    Shoulder external rotation    Middle trapezius 3+ 3+  Lower trapezius 3+ 3+  Elbow flexion    Elbow extension    Wrist flexion    Wrist extension    Wrist ulnar deviation    Wrist radial deviation    Wrist pronation    Wrist supination    Grip strength     (Blank rows = not tested)    TODAY'S TREATMENT:                                                                                                                               [facility] Adult PT Treatment:                                                DATE: 03/30/2023  Initial evaluation: see patient education and home exercise program as noted below    PATIENT EDUCATION:  Education details: reviewed initial home exercise program; discussion of POC, prognosis and goals for  skilled PT   Person educated: Patient Education method: Explanation, Demonstration, and Handouts Education comprehension: verbalized understanding  HOME EXERCISE PROGRAM: Access Code: [email or web address] URL: [email or web address] Date: 04/01/2023 Prepared by: Mauri Reading  Exercises - Supine Cervical Rotation AROM on Pillow  - 2 x daily - 7 x weekly - 1 sets - 10 reps - Supine Cervical Retraction with Towel  - 2 x daily - 7 x weekly - 1 sets - 15-20 reps - 3 sec hold - Seated Scapular Retraction  - 2 x daily - 7 x weekly - 1 sets - 15-20 reps - 3 sec  hold  ASSESSMENT:  CLINICAL IMPRESSION: Maybelline is a 47 y.o. female who was seen today for physical therapy evaluation and treatment for Neck Pain with Strength Deficits. She is also limited by dizziness with likely presence of positional vertigo. She would benefit from evaluation and treatment of these symptoms as well. She is demonstrating pain with end range cervical extension and left sidebending, she has diminished shoulder and periscapular strength. She also has moderate tenderness to palpation along L>R CS musculature, and pain that decreases with manual cervical distraction. She has related pain and difficulty with looking up, looking over her shoulder and performing heavy household activities. She requires skilled PT services at this time to address relevant deficits and improve overall function.     OBJECTIVE IMPAIRMENTS: decreased activity tolerance, decreased endurance, decreased mobility, decreased ROM, decreased strength, impaired UE functional use, improper body mechanics, postural dysfunction, and pain.   ACTIVITY LIMITATIONS: carrying, lifting, sleeping, reach over head, hygiene/grooming, and caring for others  PARTICIPATION LIMITATIONS: meal prep, cleaning, driving, and community activity  PERSONAL FACTORS: Age, Fitness, Past/current experiences, Time since onset of injury/illness/exacerbation, and 3+ comorbidities:  Relevant PMHx includes anemia, anxiety, B12 deficiency, DM, HTN, IBS, DOE, tachycardia, diabetic neuropathy   are also affecting patient's functional outcome.   REHAB POTENTIAL: Fair    CLINICAL DECISION MAKING: Evolving/moderate complexity  EVALUATION COMPLEXITY: Moderate   GOALS: Goals reviewed with patient? Yes  SHORT TERM GOALS: Target date: 04/22/2023   Patient will be independent with initial home program for cervical ROM and periscapular strengthening.  Baseline: provided at eval  Goal status: INITIAL  2.  Patient will demonstrate improved postural awareness for at least 15 minutes while seated without need for cueing from PT.   Baseline: see objective measures  Goal status: INITIAL   LONG TERM GOALS: Target date: 05/13/2023   Patient will report improved overall functional ability with FOTO score of 55 or greater Baseline: 47 Goal status: INITIAL  2.  Patient will demonstrate ability to perform CS AROM WFL in all directions with no more than 3/10 pain.  Baseline: see objective measures  Goal status: INITIAL  3.  Patient will demonstrate improved shoulder and periscapular MMT scores to at least 4+/5 in order to improved tolerance of ADLs and IADLs.  Baseline: see objective measures  Goal status: INITIAL  4.  Patient will demonstrate ability to perform overhead lifting of at least 10# using appropriate body mechanics and with no more than minimal pain in order to safely perform normal daily/occupational tasks.   Baseline: unable to tolerate d/t pain  Goal status: INITIAL  5.  Patient will demonstrate ability to perform floor to waist lifting of at least 25# using appropriate body mechanics and with no more than minimal pain in order to safely perform normal daily/occupational tasks.   Baseline: unable to tolerate d/t pain  Goal status: INITIAL     PLAN:  PT FREQUENCY: 2x/week  PT DURATION: 6 weeks  PLANNED INTERVENTIONS: 97164- PT Re-evaluation,  97110-Therapeutic exercises, 97530- Therapeutic activity, 97112- Neuromuscular re-education, 97535- Self Care, [id number]- Manual therapy, [id number]- Electrical stimulation (manual), Patient/Family education, Taping, Dry Needling, Joint mobilization, Spinal mobilization, Cryotherapy, and Moist heat  PLAN FOR NEXT SESSION: address CS ROM, periscapular strengthening, thoracic mobility, isometric-> concentric shoulder strengthening.    Mauri Reading, PT, DPT   04/01/2023, 12:02 PM  For all possible CPT codes, reference the Planned Interventions line above.     Check all conditions that are expected to impact treatment: {Conditions expected to impact  treatment:Diabetes mellitus and Musculoskeletal disorders   If treatment provided at initial evaluation, no treatment charged due to lack of authorization.

## 2023-03-31 ENCOUNTER — Other Ambulatory Visit (INDEPENDENT_AMBULATORY_CARE_PROVIDER_SITE_OTHER): Payer: Self-pay | Admitting: Adult Health

## 2023-04-05 ENCOUNTER — Other Ambulatory Visit: Payer: Self-pay | Admitting: Nurse Practitioner

## 2023-04-12 ENCOUNTER — Other Ambulatory Visit: Payer: Self-pay

## 2023-04-12 ENCOUNTER — Ambulatory Visit (INDEPENDENT_AMBULATORY_CARE_PROVIDER_SITE_OTHER): Payer: Medicaid Other | Admitting: Adult Health

## 2023-04-12 ENCOUNTER — Other Ambulatory Visit (INDEPENDENT_AMBULATORY_CARE_PROVIDER_SITE_OTHER): Payer: Self-pay | Admitting: Nurse Practitioner

## 2023-04-12 ENCOUNTER — Encounter (INDEPENDENT_AMBULATORY_CARE_PROVIDER_SITE_OTHER): Payer: Self-pay | Admitting: Adult Health

## 2023-04-12 VITALS — BP 102/69 | HR 66 | Temp 98.1°F | Ht <= 58 in | Wt 127.0 lb

## 2023-04-12 DIAGNOSIS — Z7984 Long term (current) use of oral hypoglycemic drugs: Secondary | ICD-10-CM | POA: Diagnosis not present

## 2023-04-12 DIAGNOSIS — E1159 Type 2 diabetes mellitus with other circulatory complications: Secondary | ICD-10-CM

## 2023-04-12 DIAGNOSIS — Z0000 Encounter for general adult medical examination without abnormal findings: Secondary | ICD-10-CM

## 2023-04-12 DIAGNOSIS — R109 Unspecified abdominal pain: Secondary | ICD-10-CM

## 2023-04-12 DIAGNOSIS — Z6831 Body mass index (BMI) 31.0-31.9, adult: Secondary | ICD-10-CM

## 2023-04-12 DIAGNOSIS — I152 Hypertension secondary to endocrine disorders: Secondary | ICD-10-CM

## 2023-04-12 DIAGNOSIS — K5900 Constipation, unspecified: Secondary | ICD-10-CM | POA: Diagnosis not present

## 2023-04-12 DIAGNOSIS — E1169 Type 2 diabetes mellitus with other specified complication: Secondary | ICD-10-CM | POA: Diagnosis not present

## 2023-04-12 DIAGNOSIS — E1140 Type 2 diabetes mellitus with diabetic neuropathy, unspecified: Secondary | ICD-10-CM

## 2023-04-12 DIAGNOSIS — E669 Obesity, unspecified: Secondary | ICD-10-CM | POA: Diagnosis not present

## 2023-04-12 DIAGNOSIS — Z6826 Body mass index (BMI) 26.0-26.9, adult: Secondary | ICD-10-CM

## 2023-04-12 DIAGNOSIS — E785 Hyperlipidemia, unspecified: Secondary | ICD-10-CM

## 2023-04-12 MED ORDER — ROSUVASTATIN CALCIUM 5 MG PO TABS: ORAL_TABLET | Freq: Every day | ORAL | 0 refills | Status: DC

## 2023-04-12 MED ORDER — BLOOD GLUCOSE MONITOR KIT: PACK | 0 refills | Status: DC

## 2023-04-12 MED ORDER — METFORMIN HCL ER 500 MG PO TB24: ORAL_TABLET | ORAL | 0 refills | Status: DC

## 2023-04-12 NOTE — Progress Notes (Signed)
 WEIGHT SUMMARY AND BIOMETRICS  Vitals Temp: 98.1 F (36.7 C) BP: 102/69 Pulse Rate: 66 SpO2: 93 %   Anthropometric Measurements Height: 4' 10 (1.473 m) Weight: 127 lb (57.6 kg) BMI (Calculated): 26.55 Weight at Last Visit: 124lb Weight Lost Since Last Visit: 0 Weight Gained Since Last Visit: 3lb Starting Weight: 152lb Total Weight Loss (lbs): 25 lb (11.3 kg)   Body Composition  Body Fat %: 38.8 % Fat Mass (lbs): 49.6 lbs Muscle Mass (lbs): 74.2 lbs Total Body Water (lbs): 54.8 lbs Visceral Fat Rating : 7   Other Clinical Data Fasting: no Labs: no Today's Visit #: 41 Starting Date: 01/10/20    Chief Complaint:   OBESITY Betty Cain is here to discuss her progress with her obesity treatment plan. She is on the the Category 2 Plan and states she is following her eating plan approximately 50 % of the time.  She states she is not currently exercising.   Interim History:  Betty Cain started Maintenance Phase on/about 10/26/2022  03/02/2023 OV with Dr. Armbruster/GI Recommended to suspend GLP-1 therapy over concerns of gastroparesis    She stopped Ozempic  1mg - last dose 02/24/2023  Started on Metformin  XR 500 mg one daily on 03/07/2023  The pharmacy instructed her to take Metformin  XR 500mg  30 mins prior to a meal. She reports experiencing abdominal pain.  Discussed at length to take Metformin  XR 500mg  WITH A MEAL.  She reports hunger in between meals- she is not drinking water until late evening- maybe 6 oz  Subjective:   1. Hyperlipidemia associated with type 2 diabetes mellitus Betty Cain) Discussed Labs Lipid Panel     Component Value Date/Time   CHOL 144 03/07/2023 1003   TRIG 66 03/07/2023 1003   HDL 42 03/07/2023 1003   CHOLHDL 3.4 03/07/2023 1003   CHOLHDL 5.5 10/08/2014 1121   VLDL 21 10/08/2014 1121   LDLCALC 89 03/07/2023 1003   LABVLDL 13 03/07/2023 1003    LDL above goal for diabetic She has been off daily Crestor  5mg  for > 4 weeks She    2. Constipation, unspecified constipation type Discussed Labs  3. Abdominal pain, unspecified abdominal location Discussed Labs Ozempic  1mg - last dose 02/24/2023 Started on Metformin  XR 500 mg one daily on 03/07/2023 Per pt-  The pharmacy instructed her to take Metformin  XR 500mg  30 mins prior to a meal. She reports experiencing abdominal pain.  Discussed at length to take Metformin  XR 500mg  WITH A MEAL.  4. Hypertension associated with diabetes (HCC) Discussed Labs BP at goal 03/07/2023 CMP- electrolytes, kidney.liver enzymes are stable  5. Type 2 diabetes mellitus with obesity (HCC) Discussed Labs 03/02/2023 OV with Dr. Armbruster/GI Recommended to suspend GLP-1 therapy over concerns of gastroparesis    She stopped Ozempic  1mg - last dose 02/24/2023  She was started on Metformin  XR 500 mg one daily on 03/07/2023  Home fasting CBG range 82-120s She denies sx's of hypoglycemia She endorses abdominal pain after taking Metformin  XR 500mg  on AN EMPTY STOMACH  Assessment/Plan:   1. Hyperlipidemia associated with type 2 diabetes mellitus (HCC) Contact PCP about refill of statin  2. Constipation, unspecified constipation type Increase water intake Drink 4 oz plain water every 60-90 mins while awake  3. Abdominal pain, unspecified abdominal location (Primary) Take Metformin  XR 500mg  WITH FOOD F/u with established GI/Dr. Leigh, re: abdominal pain  4. Hypertension associated with diabetes (HCC) Continue ivabradine  (CORLANOR ) 5 MG TABS tablet  aspirin EC 81 MG tablet  bisoprolol  (ZEBETA ) 5 MG  tablet  cloNIDine  (CATAPRES ) 0.1 MG tablet  metFORMIN  (GLUCOPHAGE -XR) 500 MG 24 hr tablet   5. Type 2 diabetes mellitus with obesity (HCC) Refill metFORMIN  (GLUCOPHAGE -XR) 500 MG 24 hr tablet Take 1 tab daily with largest meal TAKE WITH FOOD Dispense: 90 tablet, Refills: 0 ordered   6. Obesity, current BMI 26.55  Betty Cain is currently in the action stage of change. As such,  her goal is to maintain weight for now. She has agreed to the Category 2 Plan.   Exercise goals: All adults should avoid inactivity. Some physical activity is better than none, and adults who participate in any amount of physical activity gain some health benefits. Adults should also include muscle-strengthening activities that involve all major muscle groups on 2 or more days a week.  Increase water intake Increase daily walking  Behavioral modification strategies: increasing lean protein intake, decreasing simple carbohydrates, increasing vegetables, increasing water intake, no skipping meals, meal planning and cooking strategies, keeping healthy foods in the home, holiday eating strategies , and avoiding temptations.  Betty Cain has agreed to follow-up with our clinic in 4 weeks. She was informed of the importance of frequent follow-up visits to maximize her success with intensive lifestyle modifications for her multiple health conditions.   Objective:   Blood pressure 102/69, pulse 66, temperature 98.1 F (36.7 C), height 4' 10 (1.473 m), weight 127 lb (57.6 kg), SpO2 93%. Body mass index is 26.54 kg/m.  General: Cooperative, alert, well developed, in no acute distress. HEENT: Conjunctivae and lids unremarkable. Cardiovascular: Regular rhythm.  Lungs: Normal work of breathing. Neurologic: No focal deficits.   Lab Results  Component Value Date   CREATININE 0.56 (L) 03/07/2023   BUN 17 03/07/2023   NA 142 03/07/2023   K 4.6 03/07/2023   CL 105 03/07/2023   CO2 21 03/07/2023   Lab Results  Component Value Date   ALT 15 03/07/2023   AST 18 03/07/2023   ALKPHOS 82 03/07/2023   BILITOT 0.5 03/07/2023   Lab Results  Component Value Date   HGBA1C 5.3 03/07/2023   HGBA1C 5.2 10/22/2022   HGBA1C 5.2 06/21/2022   HGBA1C 5.1 04/09/2022   HGBA1C 4.9 12/28/2021   Lab Results  Component Value Date   INSULIN  6.5 03/07/2023   INSULIN  7.9 06/21/2022   INSULIN  3.8 12/28/2021    INSULIN  8.7 01/10/2020   Lab Results  Component Value Date   TSH 2.530 12/28/2021   Lab Results  Component Value Date   CHOL 144 03/07/2023   HDL 42 03/07/2023   LDLCALC 89 03/07/2023   TRIG 66 03/07/2023   CHOLHDL 3.4 03/07/2023   Lab Results  Component Value Date   VD25OH 41.0 03/07/2023   VD25OH 51.2 06/21/2022   VD25OH 35.6 12/28/2021   Lab Results  Component Value Date   WBC 13.8 (H) 12/13/2022   HGB 13.7 12/13/2022   HCT 42.0 12/13/2022   MCV 90.9 12/13/2022   PLT 316 12/13/2022   Lab Results  Component Value Date   IRON 53 10/22/2022   TIBC 253 10/22/2022   FERRITIN 295 (H) 10/22/2022    Attestation Statements:   Reviewed by clinician on day of visit: allergies, medications, problem list, medical history, surgical history, family history, social history, and previous encounter notes.  I have reviewed the above documentation for accuracy and completeness, and I agree with the above. -  Lillyona Polasek d. Lexianna Weinrich, NP-C

## 2023-04-17 ENCOUNTER — Other Ambulatory Visit: Payer: Self-pay | Admitting: Internal Medicine

## 2023-04-17 ENCOUNTER — Other Ambulatory Visit (INDEPENDENT_AMBULATORY_CARE_PROVIDER_SITE_OTHER): Payer: Self-pay | Admitting: Adult Health

## 2023-04-20 ENCOUNTER — Ambulatory Visit: Payer: Medicaid Other | Attending: Physical Medicine and Rehabilitation

## 2023-04-20 ENCOUNTER — Other Ambulatory Visit (INDEPENDENT_AMBULATORY_CARE_PROVIDER_SITE_OTHER): Payer: Self-pay | Admitting: Adult Health

## 2023-04-20 DIAGNOSIS — R42 Dizziness and giddiness: Secondary | ICD-10-CM | POA: Insufficient documentation

## 2023-04-20 DIAGNOSIS — M6281 Muscle weakness (generalized): Secondary | ICD-10-CM | POA: Insufficient documentation

## 2023-04-20 DIAGNOSIS — M542 Cervicalgia: Secondary | ICD-10-CM | POA: Insufficient documentation

## 2023-04-20 DIAGNOSIS — M546 Pain in thoracic spine: Secondary | ICD-10-CM | POA: Insufficient documentation

## 2023-04-20 NOTE — Therapy (Signed)
 OUTPATIENT PHYSICAL THERAPY TREATMENT NOTE   Patient Name: Betty Cain MRN: [id number] DOB:1976/02/27, 48 y.o., female Today's Date: 04/20/2023  END OF SESSION:  PT End of Session - 04/20/23 0909     Visit Number 2    Date for PT Re-Evaluation 05/13/23    Authorization Type [employer]    Authorization Time Period 7 visits approved 04/04/23-06/02/23    Authorization - Visit Number 1    Authorization - Number of Visits 7    PT Start Time 0915    PT Stop Time 0954    PT Time Calculation (min) 39 min    Activity Tolerance Patient tolerated treatment well;Patient limited by pain    Behavior During Therapy [facility] for tasks assessed/performed             Past Medical History:  Diagnosis Date   Allergy     Anemia    Anxiety 01/2019   Asthma    B12 deficiency    Back pain    Common migraine with intractable migraine 06/28/2017   Constipation    Diabetes (HCC) 02/2019   Fatigue    GERD (gastroesophageal reflux disease)    pos H pylori   Hypertension    controlled with medication   IBS (irritable bowel syndrome) 2005   Joint pain    Neonatal death    Vaginal delivery, full term-lived x2 hours.    Palpitations    Shortness of breath    Shortness of breath on exertion    Spinal headache    Swelling of both lower extremities    Valvular heart disease    Vitamin D  deficiency 02/2019   Past Surgical History:  Procedure Laterality Date   ADENOIDECTOMY     and tonsils (as a child)   BIOPSY  08/27/2021   Procedure: BIOPSY;  Surgeon: Leigh Elspeth SQUIBB, MD;  Location: [facility];  Service: Gastroenterology;;   boil  2003   right elbow   CESAREAN SECTION     x4   CESAREAN SECTION  06/02/2011   Procedure: CESAREAN SECTION;  Surgeon: Norleen LULLA Server, MD;  Location: [facility];  Service: Gynecology;  Laterality: N/A;  Primary Cesarean Section Delivery Baby Boy @ 0004, Apgars 9/9   CESAREAN SECTION N/A 12/10/2013   Procedure: REPEAT CESAREAN SECTION;  Surgeon: Winton Felt,  MD;  Location: [facility];  Service: Obstetrics;  Laterality: N/A;   CESAREAN SECTION     colonoscopy  11/23/2018   stated had issues with sleep when in for procedure   ESOPHAGOGASTRODUODENOSCOPY (EGD) WITH PROPOFOL  N/A 08/27/2021   Procedure: ESOPHAGOGASTRODUODENOSCOPY (EGD) WITH PROPOFOL ;  Surgeon: Leigh Elspeth SQUIBB, MD;  Location: [facility];  Service: Gastroenterology;  Laterality: N/A;   OTHER SURGICAL HISTORY  2005   Uterine surgery    uterine cauterization     Patient Active Problem List   Diagnosis Date Noted   Dislocation of jaw 07/23/2022   Small vessel disease (HCC) 06/29/2022   Type 2 diabetes mellitus with obesity (HCC) 03/25/2022   Hypotension - iatrogenic 03/18/2022   Other chronic pain 02/02/2022   Dyspepsia    Early satiety    IDA (iron deficiency anemia) 01/29/2021   Paroxysmal supraventricular tachycardia (HCC) 10/01/2020   Class 1 obesity with serious comorbidity and body mass index (BMI) of 31.0 to 31.9 in adult 10/01/2020   Diabetes mellitus (HCC) 02/14/2020   Mixed diabetic hyperlipidemia associated with type 2 diabetes mellitus (HCC) 01/10/2020   Hypertension associated with diabetes (HCC) 01/10/2020   Diabetic neuropathy (  HCC) 01/10/2020   Vitamin D  deficiency 01/10/2020   Diabetes mellitus with proteinuria (HCC) 08/20/2019   History of Helicobacter pylori infection 08/08/2019   Proteinuria 06/14/2019   Elevated sed rate 05/23/2019   Elevated C-reactive protein (CRP) 05/23/2019   Diabetes mellitus without complication (HCC) 05/23/2019   Dyslipidemia (high LDL; low HDL) 05/23/2019   Type 2 diabetes mellitus with hyperglycemia, without long-term current use of insulin  (HCC) 02/17/2019   Hemoglobin A1C between 7% and 9% indicating borderline diabetic control (HCC) 02/17/2019   Breast cancer screening by mammogram 02/17/2019   Frequent headaches 02/14/2019   Anxiety 02/14/2019   Common migraine with intractable migraine 06/28/2017   Chest pain at rest  10/17/2016   Sinus tachycardia 08/18/2016   Prolonged Q-T interval on ECG 08/18/2016   Vertigo 07/22/2015   Abdominal discomfort 02/14/2015   Low back pain 02/14/2015   Abdominal pain 02/01/2015   Nausea without vomiting 02/01/2015   Environmental allergies 02/01/2015   Language barrier to communication 02/01/2015   Anemia 01/23/2015   Constipation 01/21/2015   Knee pain, bilateral 01/21/2015   Palpitations 09/18/2013   Shortness of breath 09/18/2013   Advanced maternal age in pregnancy in second trimester 06/19/2013   IBS (irritable bowel syndrome) 04/13/2003    PCP: Oley Bascom RAMAN, NP  REFERRING PROVIDER: Lorilee Sven SQUIBB, MD  REFERRING DIAG: 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 She states that her dizziness has gotten better. She also reports that she has an appointment for an additional injection tomorrow.   Hand dominance: Right  PERTINENT HISTORY:  Relevant PMHx includes anemia, anxiety, B12 deficiency, DM, HTN, IBS, DOE, tachycardia, diabetic neuropathy   PAIN:  Are you having pain?  Yes: NPRS scale: 8/10 Pain location: BIL neck/shoulder  Pain description: pain  Aggravating factors: looking over her shoulder, looking up to the ceiling, turning head quickly  Relieving factors: trigger point injections (a little improvement), hot pack, pain killer    PRECAUTIONS: None  RED FLAGS: None     WEIGHT BEARING RESTRICTIONS: No  FALLS:  Has patient fallen in last 6 months? No  LIVING  ENVIRONMENT: Lives with: lives with their family Stairs: No  OCCUPATION: Homemaking  PLOF: Independent  PATIENT GOALS: To get rid of the pain completely  NEXT MD VISIT: 04/21/2023  OBJECTIVE:  Note: Objective measures were completed at Evaluation unless otherwise noted.  DIAGNOSTIC FINDINGS:  04/09/2022 Thoracic Spine -- Negative   04/09/2022 Right shoulder -- Negative   PATIENT SURVEYS:  FOTO 47 current, 57 predicted   COGNITION: Overall cognitive status: Within functional limits for tasks assessed  SENSATION: Not tested  POSTURE: forward head  CERVICAL ROM:   Active ROM A/PROM (deg) eval  Flexion WFL  Extension WFL, p!  Right lateral flexion WFL  Left lateral flexion WFL , P!  Right rotation WFL  Left rotation WFL   (Blank rows = not tested)  UPPER EXTREMITY ROM:  Active ROM Right eval Left eval  Shoulder flexion [facility]   Shoulder extension    Shoulder abduction WFL, p! WFL  Shoulder adduction    Shoulder extension    Shoulder internal rotation    Shoulder external rotation    Elbow flexion    Elbow extension    Wrist flexion    Wrist extension    Wrist ulnar deviation    Wrist radial deviation    Wrist pronation    Wrist supination     (Blank rows = not tested)  UPPER EXTREMITY MMT:  MMT Right eval Left eval  Shoulder flexion 4- 4-  Shoulder extension    Shoulder abduction 4- 4-  Shoulder adduction    Shoulder extension    Shoulder internal rotation    Shoulder external rotation    Middle trapezius 3+ 3+  Lower trapezius 3+ 3+  Elbow flexion    Elbow extension    Wrist flexion    Wrist extension    Wrist ulnar deviation    Wrist radial deviation    Wrist pronation    Wrist supination    Grip strength     (Blank rows = not tested)    TODAY'S TREATMENT:    [facility] Adult PT Treatment:                                                DATE: 04/20/23 Therapeutic Exercise: UBE level 1 3'/3' fwd/bwd (bwd more painful) Rows RTB  2x10 Shoulder extension YTB 2x10 ER/IR BIL YTB x10 BIL Seated BIL ER with scap retraction YTB 2x10 Seated horizontal abduction YTB 2x10 Seated chin tucks 2x10 Manual: STM BIL upper traps Positional release BIL upper traps (gentle)                                                                                                   [facility] Adult PT Treatment:  DATE: 03/30/2023  Initial evaluation: see patient education and home exercise program as noted below    PATIENT EDUCATION:  Education details: reviewed initial home exercise program; discussion of POC, prognosis and goals for skilled PT   Person educated: Patient Education method: Explanation, Demonstration, and Handouts Education comprehension: verbalized understanding  HOME EXERCISE PROGRAM: Access Code: [email or web address] URL: [email or web address] Date: 04/01/2023 Prepared by: Marko Molt  Exercises - Supine Cervical Rotation AROM on Pillow  - 2 x daily - 7 x weekly - 1 sets - 10 reps - Supine Cervical Retraction with Towel  - 2 x daily - 7 x weekly - 1 sets - 15-20 reps - 3 sec hold - Seated Scapular Retraction  - 2 x daily - 7 x weekly - 1 sets - 15-20 reps - 3 sec hold  ASSESSMENT:  CLINICAL IMPRESSION: Patient presents to first follow up PT session reporting continued neck and Rt shoulder pain, improved dizziness, HEP compliance, and that she is scheduled to receive another injection tomorrow. Session today focused on periscapular and RTC strengthening as well as neck mobility. Patient was able to tolerate all prescribed exercises with no adverse effects, though she does report increased pain after most exercises. She responds well to manual therapy, reporting decreased tension. Patient continues to benefit from skilled PT services and should be progressed as able to improve functional independence.   EVAL: Kinga is a 48 y.o. female who was seen today for physical therapy  evaluation and treatment for Neck Pain with Strength Deficits. She is also limited by dizziness with likely presence of positional vertigo. She would benefit from evaluation and treatment of these symptoms as well. She is demonstrating pain with end range cervical extension and left sidebending, she has diminished shoulder and periscapular strength. She also has moderate tenderness to palpation along L>R CS musculature, and pain that decreases with manual cervical distraction. She has related pain and difficulty with looking up, looking over her shoulder and performing heavy household activities. She requires skilled PT services at this time to address relevant deficits and improve overall function.     OBJECTIVE IMPAIRMENTS: decreased activity tolerance, decreased endurance, decreased mobility, decreased ROM, decreased strength, impaired UE functional use, improper body mechanics, postural dysfunction, and pain.   ACTIVITY LIMITATIONS: carrying, lifting, sleeping, reach over head, hygiene/grooming, and caring for others  PARTICIPATION LIMITATIONS: meal prep, cleaning, driving, and community activity  PERSONAL FACTORS: Age, Fitness, Past/current experiences, Time since onset of injury/illness/exacerbation, and 3+ comorbidities: Relevant PMHx includes anemia, anxiety, B12 deficiency, DM, HTN, IBS, DOE, tachycardia, diabetic neuropathy   are also affecting patient's functional outcome.   REHAB POTENTIAL: Fair    CLINICAL DECISION MAKING: Evolving/moderate complexity  EVALUATION COMPLEXITY: Moderate   GOALS: Goals reviewed with patient? Yes  SHORT TERM GOALS: Target date: 04/22/2023   Patient will be independent with initial home program for cervical ROM and periscapular strengthening.  Baseline: provided at eval  Goal status: INITIAL  2.  Patient will demonstrate improved postural awareness for at least 15 minutes while seated without need for cueing from PT.   Baseline: see objective  measures  Goal status: INITIAL   LONG TERM GOALS: Target date: 05/13/2023   Patient will report improved overall functional ability with FOTO score of 55 or greater Baseline: 47 Goal status: INITIAL  2.  Patient will demonstrate ability to perform CS AROM WFL in all directions with no more than 3/10 pain.  Baseline: see objective measures  Goal status: INITIAL  3.  Patient will  demonstrate improved shoulder and periscapular MMT scores to at least 4+/5 in order to improved tolerance of ADLs and IADLs.  Baseline: see objective measures  Goal status: INITIAL  4.  Patient will demonstrate ability to perform overhead lifting of at least 10# using appropriate body mechanics and with no more than minimal pain in order to safely perform normal daily/occupational tasks.   Baseline: unable to tolerate d/t pain  Goal status: INITIAL  5.  Patient will demonstrate ability to perform floor to waist lifting of at least 25# using appropriate body mechanics and with no more than minimal pain in order to safely perform normal daily/occupational tasks.   Baseline: unable to tolerate d/t pain  Goal status: INITIAL     PLAN:  PT FREQUENCY: 2x/week  PT DURATION: 6 weeks  PLANNED INTERVENTIONS: 97164- PT Re-evaluation, 97110-Therapeutic exercises, 97530- Therapeutic activity, 97112- Neuromuscular re-education, 97535- Self Care, [id number]- Manual therapy, [id number]- Electrical stimulation (manual), Patient/Family education, Taping, Dry Needling, Joint mobilization, Spinal mobilization, Cryotherapy, and Moist heat  PLAN FOR NEXT SESSION: address CS ROM, periscapular strengthening, thoracic mobility, isometric-> concentric shoulder strengthening.    Corean Pouch PTA  04/20/2023, 9:54 AM

## 2023-04-21 ENCOUNTER — Encounter
Payer: Medicaid Other | Attending: Physical Medicine and Rehabilitation | Admitting: Physical Medicine and Rehabilitation

## 2023-04-21 VITALS — BP 88/60 | HR 83 | Ht <= 58 in | Wt 133.0 lb

## 2023-04-21 DIAGNOSIS — M7918 Myalgia, other site: Secondary | ICD-10-CM | POA: Diagnosis not present

## 2023-04-21 MED ORDER — PREGABALIN 25 MG PO CAPS: 25.0000 mg | ORAL_CAPSULE | Freq: Every day | ORAL | 3 refills | Status: DC

## 2023-04-21 MED ORDER — LIDOCAINE HCL 1 % IJ SOLN
3.0000 mL | Freq: Once | INTRAMUSCULAR | Status: AC
  Administered 2023-04-21: 6 mL

## 2023-04-21 MED ORDER — SODIUM CHLORIDE (PF) 0.9 % IJ SOLN
2.0000 mL | Freq: Once | INTRAMUSCULAR | Status: AC
  Administered 2023-04-21: 4 mL via INTRAVENOUS

## 2023-04-21 NOTE — Progress Notes (Signed)
 Trigger Point Injection  Indication: Cervical myofascial pain not relieved by medication management and other conservative care.  Informed consent was obtained after describing risk and benefits of the procedure with the patient, this includes bleeding, bruising, infection and medication side effects.  The patient wishes to proceed and has given written consent.  The patient was placed in a seated position.  The area of pain was marked and prepped with Betadine.  It was entered with a 25-gauge 1/2 inch needle and a total of 5 mL of 1% lidocaine  and normal saline was injected into a total of 4 trigger points, after negative draw back for blood.  The patient tolerated the procedure well.  Post procedure instructions were given.

## 2023-04-22 ENCOUNTER — Ambulatory Visit: Payer: Medicaid Other | Admitting: Nurse Practitioner

## 2023-04-22 ENCOUNTER — Encounter: Payer: Self-pay | Admitting: Nurse Practitioner

## 2023-04-22 VITALS — BP 97/63 | HR 97 | Temp 97.1°F | Wt 134.8 lb

## 2023-04-22 DIAGNOSIS — J069 Acute upper respiratory infection, unspecified: Secondary | ICD-10-CM

## 2023-04-22 DIAGNOSIS — E1140 Type 2 diabetes mellitus with diabetic neuropathy, unspecified: Secondary | ICD-10-CM

## 2023-04-22 DIAGNOSIS — Z0000 Encounter for general adult medical examination without abnormal findings: Secondary | ICD-10-CM

## 2023-04-22 DIAGNOSIS — J3089 Other allergic rhinitis: Secondary | ICD-10-CM

## 2023-04-22 DIAGNOSIS — E1165 Type 2 diabetes mellitus with hyperglycemia: Secondary | ICD-10-CM | POA: Diagnosis not present

## 2023-04-22 LAB — POCT GLYCOSYLATED HEMOGLOBIN (HGB A1C): Hemoglobin A1C: 5.4 % (ref 4.0–5.6)

## 2023-04-22 MED ORDER — BISOPROLOL FUMARATE 5 MG PO TABS: 5.0000 mg | ORAL_TABLET | Freq: Two times a day (BID) | ORAL | 3 refills | Status: DC

## 2023-04-22 MED ORDER — FLUTICASONE PROPIONATE 50 MCG/ACT NA SUSP: 2.0000 | Freq: Every day | NASAL | 1 refills | Status: DC

## 2023-04-22 MED ORDER — PANTOPRAZOLE SODIUM 40 MG PO TBEC: 40.0000 mg | DELAYED_RELEASE_TABLET | Freq: Two times a day (BID) | ORAL | 3 refills | Status: DC

## 2023-04-22 MED ORDER — ACCU-CHEK SOFTCLIX LANCETS MISC: 0 refills | Status: DC

## 2023-04-22 MED ORDER — AZITHROMYCIN 250 MG PO TABS: ORAL_TABLET | ORAL | 0 refills | Status: AC

## 2023-04-22 MED ORDER — LEVOCETIRIZINE DIHYDROCHLORIDE 5 MG PO TABS: 5.0000 mg | ORAL_TABLET | Freq: Every evening | ORAL | 0 refills | Status: DC

## 2023-04-22 MED ORDER — ROSUVASTATIN CALCIUM 5 MG PO TABS: ORAL_TABLET | Freq: Every day | ORAL | 0 refills | Status: DC

## 2023-04-22 MED ORDER — CLONIDINE HCL 0.1 MG PO TABS: 0.1000 mg | ORAL_TABLET | Freq: Two times a day (BID) | ORAL | 0 refills | Status: DC

## 2023-04-22 MED ORDER — METFORMIN HCL ER 500 MG PO TB24: ORAL_TABLET | ORAL | 0 refills | Status: DC

## 2023-04-22 MED ORDER — VENTOLIN HFA 108 (90 BASE) MCG/ACT IN AERS: 2.0000 | INHALATION_SPRAY | Freq: Four times a day (QID) | RESPIRATORY_TRACT | 0 refills | Status: DC | PRN

## 2023-04-22 NOTE — Patient Instructions (Signed)
 1. Type 2 diabetes mellitus with hyperglycemia, without long-term current use of insulin  (HCC) (Primary)  - POCT glycosylated hemoglobin (Hb A1C)  2. Type 2 diabetes mellitus with obesity  - Accu-Chek Softclix Lancets lancets; Use as instructed  Dispense: 100 each; Refill: 0  3. Allergic rhinitis due to other allergic trigger, unspecified seasonality  - fluticasone  (FLONASE ) 50 MCG/ACT nasal spray; Place 2 sprays into both nostrils daily.  Dispense: 48 g; Refill: 1  4. Routine health maintenance  - rosuvastatin  (CRESTOR ) 5 MG tablet; TAKE 1 TABLET (5 MG TOTAL) BY MOUTH AT BEDTIME.  Dispense: 30 tablet; Refill: 0  5. Upper respiratory tract infection, unspecified type  - azithromycin  (ZITHROMAX ) 250 MG tablet; Take 2 tablets on day 1, then 1 tablet daily on days 2 through 5  Dispense: 6 tablet; Refill: 0  Follow up:  Follow up in 3 months

## 2023-04-22 NOTE — Progress Notes (Signed)
 Subjective   Patient ID: Betty Cain, female    DOB: 1975/10/13, 48 y.o.   MRN: [id number]  Chief Complaint  Patient presents with   Diabetes    Follow up    Referring provider: Oley Bascom RAMAN, NP  Bassy Fetterly is a 48 y.o. female with Past Medical History: No date: Allergy  No date: Anemia 01/2019: Anxiety No date: Asthma No date: B12 deficiency No date: Back pain 06/28/2017: Common migraine with intractable migraine No date: Constipation 02/2019: Diabetes (HCC) No date: Fatigue No date: GERD (gastroesophageal reflux disease)     Comment:  pos H pylori No date: Hypertension     Comment:  controlled with medication 2005: IBS (irritable bowel syndrome) No date: Joint pain No date: Neonatal death     Comment:  Vaginal delivery, full term-lived x2 hours.  No date: Palpitations No date: Shortness of breath No date: Shortness of breath on exertion No date: Spinal headache No date: Swelling of both lower extremities No date: Valvular heart disease 02/2019: Vitamin D  deficiency   HPI  Patient presents today for follow-up visit.  A1C today was 5.4. feeling better. Patient is following with pain management. States that it is helping. Also following with weight loss clinic. Does complain today of some chest congestion. This has started over the past few days. Will trial azithromycin .  Denies f/c/s, n/v/d, hemoptysis, PND, leg swelling. Denies chest pain or edema.    Allergies  Allergen Reactions   Seasonal Ic [Octacosanol]    Topamax  [Topiramate ] Itching and Rash    Immunization History  Administered Date(s) Administered   Influenza,inj,Quad PF,6+ Mos 06/19/2013, 12/02/2015   PFIZER(Purple Top)SARS-COV-2 Vaccination 07/07/2019, 07/28/2019   Pneumococcal Conjugate-13 05/21/2019   Tdap 09/13/2013, 12/11/2013    Tobacco History: Social History   Tobacco Use  Smoking Status Never  Smokeless Tobacco Never   Counseling given: Not Answered   Outpatient Encounter  Medications as of 04/22/2023  Medication Sig   acetaminophen  (TYLENOL ) 500 MG tablet Take 500 mg by mouth every 6 (six) hours as needed.   aspirin EC 81 MG tablet Take 81 mg by mouth daily. Swallow whole.   azithromycin  (ZITHROMAX ) 250 MG tablet Take 2 tablets on day 1, then 1 tablet daily on days 2 through 5   blood glucose meter kit and supplies KIT Dispense based on patient and insurance preference. Use up to four times daily as directed.   Blood Pressure Monitoring DEVI 1 each by Does not apply route daily.   Caraway Oil-Levomenthol (FDGARD) 25-20.75 MG CAPS Use as directed   cholecalciferol  (VITAMIN D3) 25 MCG (1000 UNIT) tablet Take 1,000 Units by mouth daily.   Fremanezumab -vfrm (AJOVY ) 225 MG/1.5ML SOAJ Inject 225 mg into the skin every 30 (thirty) days.   glucose blood (ACCU-CHEK GUIDE) test strip Use as instructed   ibuprofen  (ADVIL ) 800 MG tablet Take 800 mg by mouth every 6 (six) hours as needed.   lidocaine  (LIDODERM ) 5 % USE 1 PATCH EXTERNALLY ONCE DAILY REMOVE  AND  DISCARD  PATCH  WITHIN  12  HOURS  OR  AS  DIRECTED  BY  MD   Multiple Vitamin (MULTI VITAMIN DAILY PO) Take by mouth.   ondansetron  (ZOFRAN ) 4 MG tablet Take 1 tablet (4 mg total) by mouth every 8 (eight) hours as needed for nausea or vomiting.   pregabalin  (LYRICA ) 25 MG capsule Take 1 capsule (25 mg total) by mouth at bedtime.   Rimegepant Sulfate  75 MG TBDP Take 1 tab at onset of  migraine. May repeat in 2 hrs, if needed. Max dose: 2 tabs/day or 15/month. This is a 90 day rx.   tiZANidine  (ZANAFLEX ) 4 MG tablet Take 4 mg by mouth every 6 (six) hours.   [DISCONTINUED] Accu-Chek Softclix Lancets lancets USE UP TO FOUR TIMES DAILY AS DIRECTED   [DISCONTINUED] bisoprolol  (ZEBETA ) 5 MG tablet Take 1 tablet by mouth twice daily   [DISCONTINUED] cloNIDine  (CATAPRES ) 0.1 MG tablet Take 1 tablet by mouth twice daily   [DISCONTINUED] fluticasone  (FLONASE ) 50 MCG/ACT nasal spray PLACE 2 SPRAYS INTO BOTH NOSTRILS DAILY.    [DISCONTINUED] levocetirizine (XYZAL ) 5 MG tablet TAKE 1 TABLET BY MOUTH ONCE DAILY IN THE EVENING   [DISCONTINUED] metFORMIN  (GLUCOPHAGE -XR) 500 MG 24 hr tablet Take 1 tab daily with largest meal TAKE WITH FOOD   [DISCONTINUED] pantoprazole  (PROTONIX ) 40 MG tablet Take 1 tablet (40 mg total) by mouth 2 (two) times daily before a meal.   [DISCONTINUED] rosuvastatin  (CRESTOR ) 5 MG tablet TAKE 1 TABLET (5 MG TOTAL) BY MOUTH AT BEDTIME.   Accu-Chek Softclix Lancets lancets Use as instructed   amoxicillin -clavulanate (AUGMENTIN ) 250-125 MG tablet Take 1 tablet by mouth every 8 (eight) hours. (Patient not taking: Reported on 04/22/2023)   bisoprolol  (ZEBETA ) 5 MG tablet Take 1 tablet (5 mg total) by mouth 2 (two) times daily.   cloNIDine  (CATAPRES ) 0.1 MG tablet Take 1 tablet (0.1 mg total) by mouth 2 (two) times daily.   dicyclomine  (BENTYL ) 10 MG/5ML solution Take 5 mLs (10 mg total) by mouth 4 (four) times daily -  before meals and at bedtime. (Patient not taking: Reported on 04/22/2023)   docusate sodium  (COLACE) 100 MG capsule Take 1 capsule (100 mg total) by mouth 2 (two) times daily. (Patient not taking: Reported on 04/22/2023)   ferrous sulfate  (FEROSUL) 325 (65 FE) MG tablet TAKE 1 TABLET (325 MG TOTAL) BY MOUTH DAILY. (Patient not taking: Reported on 04/22/2023)   fluticasone  (FLONASE ) 50 MCG/ACT nasal spray Place 2 sprays into both nostrils daily.   ivabradine  (CORLANOR ) 5 MG TABS tablet Take 3 tablets by mouth two hours prior to cardiac CT scan. (Patient not taking: Reported on 04/22/2023)   KLOR-CON  M20 20 MEQ tablet Take 1 tablet by mouth once daily (Patient not taking: Reported on 04/22/2023)   levocetirizine (XYZAL ) 5 MG tablet Take 1 tablet (5 mg total) by mouth every evening.   metFORMIN  (GLUCOPHAGE -XR) 500 MG 24 hr tablet Take 1 tab daily with largest meal TAKE WITH FOOD   methylcellulose (CITRUCEL) oral powder Take as directed, daily (Patient not taking: Reported on 04/22/2023)    pantoprazole  (PROTONIX ) 40 MG tablet Take 1 tablet (40 mg total) by mouth 2 (two) times daily before a meal.   rosuvastatin  (CRESTOR ) 5 MG tablet TAKE 1 TABLET (5 MG TOTAL) BY MOUTH AT BEDTIME.   SPRINTEC  28 0.25-35 MG-MCG tablet Take 1 tablet by mouth once daily (Patient not taking: Reported on 04/22/2023)   sucralfate  (CARAFATE ) 1 g tablet Take 1 tablet (1 g total) by mouth every 6 (six) hours as needed. Slowly dissolve 1 tablet in 1 tablespoon of distilled water prior to ingestion. (Patient not taking: Reported on 04/22/2023)   VENTOLIN  HFA 108 (90 Base) MCG/ACT inhaler Inhale 2 puffs into the lungs every 6 (six) hours as needed for wheezing or shortness of breath.   [DISCONTINUED] VENTOLIN  HFA 108 (90 Base) MCG/ACT inhaler INHALE 2 PUFFS BY MOUTH EVERY 6 HOURS AS NEEDED FOR WHEEZING FOR SHORTNESS OF BREATH (Patient not taking: Reported on  04/22/2023)   No facility-administered encounter medications on file as of 04/22/2023.    Review of Systems  Review of Systems  Constitutional: Negative.   HENT: Negative.    Cardiovascular: Negative.   Gastrointestinal: Negative.   Allergic/Immunologic: Negative.   Neurological: Negative.   Psychiatric/Behavioral: Negative.       Objective:   BP 97/63   Pulse 97   Temp (!) 97.1 F (36.2 C)   Wt 134 lb 12.8 oz (61.1 kg)   SpO2 100%   BMI 28.17 kg/m   Wt Readings from Last 5 Encounters:  04/22/23 134 lb 12.8 oz (61.1 kg)  04/21/23 133 lb (60.3 kg)  04/12/23 127 lb (57.6 kg)  03/23/23 128 lb 9.6 oz (58.3 kg)  03/07/23 124 lb (56.2 kg)     Physical Exam Vitals and nursing note reviewed.  Constitutional:      General: She is not in acute distress.    Appearance: She is well-developed.  Cardiovascular:     Rate and Rhythm: Normal rate and regular rhythm.  Pulmonary:     Effort: Pulmonary effort is normal.     Breath sounds: Rhonchi present.  Neurological:     Mental Status: She is alert and oriented to person, place, and time.        Assessment & Plan:   Type 2 diabetes mellitus with hyperglycemia, without long-term current use of insulin  (HCC) -     POCT glycosylated hemoglobin (Hb A1C)  Type 2 diabetes mellitus with obesity -     Accu-Chek Softclix Lancets; Use as instructed  Dispense: 100 each; Refill: 0  Allergic rhinitis due to other allergic trigger, unspecified seasonality -     Fluticasone  Propionate; Place 2 sprays into both nostrils daily.  Dispense: 48 g; Refill: 1  Routine health maintenance -     Rosuvastatin  Calcium ; TAKE 1 TABLET (5 MG TOTAL) BY MOUTH AT BEDTIME.  Dispense: 30 tablet; Refill: 0  Upper respiratory tract infection, unspecified type -     Azithromycin ; Take 2 tablets on day 1, then 1 tablet daily on days 2 through 5  Dispense: 6 tablet; Refill: 0  Other orders -     Bisoprolol  Fumarate; Take 1 tablet (5 mg total) by mouth 2 (two) times daily.  Dispense: 180 tablet; Refill: 3 -     cloNIDine  HCl; Take 1 tablet (0.1 mg total) by mouth 2 (two) times daily.  Dispense: 60 tablet; Refill: 0 -     Levocetirizine Dihydrochloride ; Take 1 tablet (5 mg total) by mouth every evening.  Dispense: 30 tablet; Refill: 0 -     metFORMIN  HCl ER; Take 1 tab daily with largest meal TAKE WITH FOOD  Dispense: 90 tablet; Refill: 0 -     Pantoprazole  Sodium; Take 1 tablet (40 mg total) by mouth 2 (two) times daily before a meal.  Dispense: 60 tablet; Refill: 3 -     Ventolin  HFA; Inhale 2 puffs into the lungs every 6 (six) hours as needed for wheezing or shortness of breath.  Dispense: 18 g; Refill: 0     Return in about 3 months (around 07/21/2023).   Bascom GORMAN Borer, NP 04/22/2023

## 2023-04-23 ENCOUNTER — Ambulatory Visit: Payer: Medicaid Other

## 2023-04-25 ENCOUNTER — Encounter: Payer: Self-pay | Admitting: Gastroenterology

## 2023-04-26 ENCOUNTER — Ambulatory Visit: Payer: Medicaid Other

## 2023-04-26 DIAGNOSIS — M546 Pain in thoracic spine: Secondary | ICD-10-CM

## 2023-04-26 DIAGNOSIS — M542 Cervicalgia: Secondary | ICD-10-CM | POA: Diagnosis not present

## 2023-04-26 DIAGNOSIS — M6281 Muscle weakness (generalized): Secondary | ICD-10-CM

## 2023-04-26 DIAGNOSIS — R42 Dizziness and giddiness: Secondary | ICD-10-CM | POA: Diagnosis not present

## 2023-04-26 MED ORDER — DULOXETINE HCL 30 MG PO CPEP: 30.0000 mg | ORAL_CAPSULE | Freq: Every day | ORAL | 3 refills | Status: DC

## 2023-04-26 NOTE — Therapy (Signed)
 OUTPATIENT PHYSICAL THERAPY TREATMENT NOTE   Patient Name: Betty Cain MRN: [id number] DOB:May 20, 1975, 48 y.o., female Today's Date: 04/26/2023  END OF SESSION:  PT End of Session - 04/26/23 1515     Visit Number 3    Date for PT Re-Evaluation 05/13/23    Authorization Type [employer]    Authorization Time Period 7 visits approved 04/04/23-06/02/23    Authorization - Visit Number 2    Authorization - Number of Visits 7    PT Start Time 1130    PT Stop Time 1210    PT Time Calculation (min) 40 min    Activity Tolerance Patient tolerated treatment well;Patient limited by pain    Behavior During Therapy [facility] for tasks assessed/performed              Past Medical History:  Diagnosis Date   Allergy     Anemia    Anxiety 01/2019   Asthma    B12 deficiency    Back pain    Common migraine with intractable migraine 06/28/2017   Constipation    Diabetes (HCC) 02/2019   Fatigue    GERD (gastroesophageal reflux disease)    pos H pylori   Hypertension    controlled with medication   IBS (irritable bowel syndrome) 2005   Joint pain    Neonatal death    Vaginal delivery, full term-lived x2 hours.    Palpitations    Shortness of breath    Shortness of breath on exertion    Spinal headache    Swelling of both lower extremities    Valvular heart disease    Vitamin D  deficiency 02/2019   Past Surgical History:  Procedure Laterality Date   ADENOIDECTOMY     and tonsils (as a child)   BIOPSY  08/27/2021   Procedure: BIOPSY;  Surgeon: Leigh Elspeth SQUIBB, MD;  Location: [facility];  Service: Gastroenterology;;   boil  2003   right elbow   CESAREAN SECTION     x4   CESAREAN SECTION  06/02/2011   Procedure: CESAREAN SECTION;  Surgeon: Norleen LULLA Server, MD;  Location: [facility];  Service: Gynecology;  Laterality: N/A;  Primary Cesarean Section Delivery Baby Boy @ 0004, Apgars 9/9   CESAREAN SECTION N/A 12/10/2013   Procedure: REPEAT CESAREAN SECTION;  Surgeon: Winton Felt, MD;  Location: [facility];  Service: Obstetrics;  Laterality: N/A;   CESAREAN SECTION     colonoscopy  11/23/2018   stated had issues with sleep when in for procedure   ESOPHAGOGASTRODUODENOSCOPY (EGD) WITH PROPOFOL  N/A 08/27/2021   Procedure: ESOPHAGOGASTRODUODENOSCOPY (EGD) WITH PROPOFOL ;  Surgeon: Leigh Elspeth SQUIBB, MD;  Location: [facility];  Service: Gastroenterology;  Laterality: N/A;   OTHER SURGICAL HISTORY  2005   Uterine surgery    uterine cauterization     Patient Active Problem List   Diagnosis Date Noted   Dislocation of jaw 07/23/2022   Small vessel disease (HCC) 06/29/2022   Type 2 diabetes mellitus with obesity (HCC) 03/25/2022   Hypotension - iatrogenic 03/18/2022   Other chronic pain 02/02/2022   Dyspepsia    Early satiety    IDA (iron deficiency anemia) 01/29/2021   Paroxysmal supraventricular tachycardia (HCC) 10/01/2020   Class 1 obesity with serious comorbidity and body mass index (BMI) of 31.0 to 31.9 in adult 10/01/2020   Diabetes mellitus (HCC) 02/14/2020   Mixed diabetic hyperlipidemia associated with type 2 diabetes mellitus (HCC) 01/10/2020   Hypertension associated with diabetes (HCC) 01/10/2020   Diabetic  neuropathy (HCC) 01/10/2020   Vitamin D  deficiency 01/10/2020   Diabetes mellitus with proteinuria (HCC) 08/20/2019   History of Helicobacter pylori infection 08/08/2019   Proteinuria 06/14/2019   Elevated sed rate 05/23/2019   Elevated C-reactive protein (CRP) 05/23/2019   Diabetes mellitus without complication (HCC) 05/23/2019   Dyslipidemia (high LDL; low HDL) 05/23/2019   Type 2 diabetes mellitus with hyperglycemia, without long-term current use of insulin  (HCC) 02/17/2019   Hemoglobin A1C between 7% and 9% indicating borderline diabetic control (HCC) 02/17/2019   Breast cancer screening by mammogram 02/17/2019   Frequent headaches 02/14/2019   Anxiety 02/14/2019   Common migraine with intractable migraine 06/28/2017   Chest pain at  rest 10/17/2016   Sinus tachycardia 08/18/2016   Prolonged Q-T interval on ECG 08/18/2016   Vertigo 07/22/2015   Abdominal discomfort 02/14/2015   Low back pain 02/14/2015   Abdominal pain 02/01/2015   Nausea without vomiting 02/01/2015   Environmental allergies 02/01/2015   Language barrier to communication 02/01/2015   Anemia 01/23/2015   Constipation 01/21/2015   Knee pain, bilateral 01/21/2015   Palpitations 09/18/2013   Shortness of breath 09/18/2013   Advanced maternal age in pregnancy in second trimester 06/19/2013   IBS (irritable bowel syndrome) 04/13/2003    PCP: Oley Bascom RAMAN, NP  REFERRING PROVIDER: Lorilee Sven SQUIBB, MD  REFERRING DIAG: 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  Hand dominance: Right  PERTINENT HISTORY:  Relevant PMHx includes anemia, anxiety, B12 deficiency, DM, HTN, IBS, DOE, tachycardia, diabetic neuropathy   PAIN:  Are you having pain?  Yes: NPRS scale: 8/10 Pain location: BIL neck/shoulder  Pain description: pain  Aggravating factors: looking over her shoulder, looking up to the ceiling, turning head quickly  Relieving factors: trigger point injections (a little improvement), hot pack, pain killer    PRECAUTIONS: None  RED FLAGS: None     WEIGHT BEARING RESTRICTIONS: No  FALLS:  Has patient fallen in last 6 months? No  LIVING ENVIRONMENT: Lives with: lives with their family Stairs: No  OCCUPATION: Homemaking  PLOF:  Independent  PATIENT GOALS: To get rid of the pain completely  NEXT MD VISIT: 04/21/2023  OBJECTIVE:  Note: Objective measures were completed at Evaluation unless otherwise noted.  DIAGNOSTIC FINDINGS:  04/09/2022 Thoracic Spine -- Negative   04/09/2022 Right shoulder -- Negative   PATIENT SURVEYS:  FOTO 47 current, 57 predicted   COGNITION: Overall cognitive status: Within functional limits for tasks assessed  SENSATION: Not tested  POSTURE: forward head  CERVICAL ROM:   Active ROM A/PROM (deg) eval  Flexion WFL  Extension WFL, p!  Right lateral flexion WFL  Left lateral flexion WFL , P!  Right rotation WFL  Left rotation WFL   (Blank rows = not tested)  UPPER EXTREMITY ROM:  Active ROM Right eval Left eval  Shoulder flexion [facility]   Shoulder extension    Shoulder abduction WFL, p! WFL  Shoulder adduction    Shoulder extension    Shoulder internal rotation    Shoulder external rotation    Elbow flexion    Elbow extension    Wrist flexion    Wrist extension    Wrist ulnar deviation    Wrist radial deviation    Wrist pronation    Wrist supination     (Blank rows = not tested)  UPPER EXTREMITY MMT:  MMT Right eval Left eval  Shoulder flexion 4- 4-  Shoulder extension    Shoulder abduction 4- 4-  Shoulder adduction    Shoulder extension    Shoulder internal rotation    Shoulder external rotation    Middle trapezius 3+ 3+  Lower trapezius 3+ 3+  Elbow flexion    Elbow extension    Wrist flexion    Wrist extension    Wrist ulnar deviation    Wrist radial deviation    Wrist pronation    Wrist supination    Grip strength     (Blank rows = not tested)    TODAY'S TREATMENT:     [facility] Adult PT Treatment:                                                DATE: 04/26/23  Therapeutic Exercise: UBE level 1 3'/3' fwd/bwd (bwd more painful) Rows RTB 2x10 Shoulder extension YTB 2x10 ER/IR BIL YTB 2 x10 BIL Seated BIL ER with scap retraction  YTB 2x10 Seated horizontal abduction YTB 2x10 Seated chin tucks 2x10 Manual: STM BIL upper traps, 2 x 10  Manual upper trap stretch x 2    [facility] Adult PT Treatment:                                                DATE: 04/20/23 Therapeutic Exercise: UBE level 1 3'/3' fwd/bwd (bwd more painful) Rows RTB 2x10 Shoulder extension YTB 2x10 ER/IR BIL YTB x10 BIL Seated BIL ER with scap retraction YTB 2x10 Seated horizontal abduction YTB 2x10 Seated chin tucks 2x10 Manual: STM BIL upper traps Positional release BIL upper traps (gentle)  [facility] Adult PT Treatment:                                                DATE: 03/30/2023  Initial evaluation: see patient education and home exercise program as noted below    PATIENT EDUCATION:  Education details: reviewed initial home exercise program; discussion of POC, prognosis and goals for skilled PT   Person educated: Patient Education method: Explanation, Demonstration, and Handouts Education comprehension: verbalized understanding  HOME EXERCISE PROGRAM: Access Code: [email or web address] URL: [email or web address] Date: 04/01/2023 Prepared by: Marko Molt  Exercises - Supine Cervical Rotation AROM on Pillow  - 2 x daily - 7 x weekly - 1 sets - 10 reps - Supine Cervical Retraction with Towel  - 2 x daily - 7 x weekly - 1 sets - 15-20 reps - 3 sec hold - Seated Scapular Retraction  - 2 x daily - 7 x weekly - 1 sets - 15-20 reps - 3 sec hold  ASSESSMENT:  CLINICAL IMPRESSION: Patient had difficulty today with resisted ER and may benefit from modification to isometric ER/IR activities at next visit. She continues to have significant muscle tension throughout R>L upper trap mm. She had significant dizziness, consistent with PMHx of vertigo, when performing supine-to-sit following manual therapy today. We will continue to progress as tolerated.    I will reach out to referring provider today to request referral for eval/treat of vertigo, which is impacting current treatments.    EVAL: Denaly is a 48 y.o. female who was seen today for physical therapy evaluation and treatment for Neck Pain with Strength Deficits. She is also limited by dizziness with likely presence of positional vertigo. She would benefit from evaluation and treatment of these symptoms as well. She is demonstrating pain with end range cervical extension and left sidebending, she has diminished shoulder and periscapular strength. She also has moderate tenderness to palpation along L>R CS musculature, and pain that decreases with manual cervical distraction. She has related pain and difficulty with looking up, looking over her shoulder and performing heavy household activities. She requires skilled PT services at this time to address relevant deficits and improve overall function.     OBJECTIVE IMPAIRMENTS: decreased activity tolerance, decreased endurance, decreased mobility, decreased ROM, decreased strength, impaired UE functional use, improper body mechanics, postural dysfunction, and pain.   ACTIVITY LIMITATIONS: carrying, lifting, sleeping, reach over head, hygiene/grooming, and caring for others  PARTICIPATION LIMITATIONS: meal prep, cleaning, driving, and community activity  PERSONAL FACTORS: Age, Fitness, Past/current experiences, Time since onset of injury/illness/exacerbation, and 3+ comorbidities: Relevant PMHx includes anemia, anxiety, B12 deficiency, DM, HTN, IBS, DOE, tachycardia, diabetic neuropathy   are also affecting patient's functional outcome.   REHAB POTENTIAL: Fair    CLINICAL DECISION MAKING: Evolving/moderate complexity  EVALUATION COMPLEXITY: Moderate   GOALS: Goals reviewed with patient? Yes  SHORT TERM GOALS: Target date: 04/22/2023   Patient will be independent with initial home program for cervical ROM and periscapular strengthening.   Baseline: provided at eval  Goal status: INITIAL  2.  Patient will demonstrate improved postural awareness for at least 15 minutes while seated without need for cueing from PT.   Baseline: see objective measures  Goal status: INITIAL   LONG TERM GOALS: Target date: 05/13/2023   Patient will report improved overall functional ability with FOTO score of 55 or greater Baseline: 47  Goal status: INITIAL  2.  Patient will demonstrate ability to perform CS AROM WFL in all directions with no more than 3/10 pain.  Baseline: see objective measures  Goal status: INITIAL  3.  Patient will demonstrate improved shoulder and periscapular MMT scores to at least 4+/5 in order to improved tolerance of ADLs and IADLs.  Baseline: see objective measures  Goal status: INITIAL  4.  Patient will demonstrate ability to perform overhead lifting of at least 10# using appropriate body mechanics and with no more than minimal pain in order to safely perform normal daily/occupational tasks.   Baseline: unable to tolerate d/t pain  Goal status: INITIAL  5.  Patient will demonstrate ability to perform floor to waist lifting of at least 25# using appropriate body mechanics and with no more than minimal pain in order to safely perform normal daily/occupational tasks.   Baseline: unable to tolerate d/t pain  Goal status: INITIAL     PLAN:  PT FREQUENCY: 2x/week  PT DURATION: 6 weeks  PLANNED INTERVENTIONS: [id number]- PT Re-evaluation, 97110-Therapeutic exercises, 97530- Therapeutic activity, 97112- Neuromuscular re-education, 97535- Self Care, [id number]- Manual therapy, [id number]- Electrical stimulation (manual), Patient/Family education, Taping, Dry Needling, Joint mobilization, Spinal mobilization, Cryotherapy, and Moist heat  PLAN FOR NEXT SESSION: address CS ROM, periscapular strengthening, thoracic mobility, isometric-> concentric shoulder strengthening.    Marko Molt, PT, DPT  04/26/2023 3:17 PM

## 2023-04-27 ENCOUNTER — Ambulatory Visit: Payer: Medicaid Other

## 2023-04-27 DIAGNOSIS — M542 Cervicalgia: Secondary | ICD-10-CM | POA: Diagnosis not present

## 2023-04-27 DIAGNOSIS — M6281 Muscle weakness (generalized): Secondary | ICD-10-CM

## 2023-04-27 DIAGNOSIS — M546 Pain in thoracic spine: Secondary | ICD-10-CM | POA: Diagnosis not present

## 2023-04-27 DIAGNOSIS — R42 Dizziness and giddiness: Secondary | ICD-10-CM | POA: Diagnosis not present

## 2023-04-27 NOTE — Therapy (Signed)
 OUTPATIENT PHYSICAL THERAPY TREATMENT NOTE   Patient Name: Betty Cain MRN: [id number] DOB:[birth year], 48 y.o., female Today's Date: 04/27/2023  END OF SESSION:  PT End of Session - 04/27/23 0913     Visit Number 4    Date for PT Re-Evaluation 05/13/23    Authorization Type [employer]    Authorization Time Period 7 visits approved 04/04/23-06/02/23    Authorization - Visit Number 3    Authorization - Number of Visits 7    PT Start Time 0915    PT Stop Time 0953    PT Time Calculation (min) 38 min    Activity Tolerance Patient tolerated treatment well;Patient limited by pain    Behavior During Therapy [facility] for tasks assessed/performed             Past Medical History:  Diagnosis Date   Allergy    Anemia    Anxiety 01/2019   Asthma    B12 deficiency    Back pain    Common migraine with intractable migraine 06/28/2017   Constipation    Diabetes (HCC) 02/2019   Fatigue    GERD (gastroesophageal reflux disease)    pos H pylori   Hypertension    controlled with medication   IBS (irritable bowel syndrome) 2005   Joint pain    Neonatal death    Vaginal delivery, full term-lived x2 hours.    Palpitations    Shortness of breath    Shortness of breath on exertion    Spinal headache    Swelling of both lower extremities    Valvular heart disease    Vitamin D  deficiency 02/2019   Past Surgical History:  Procedure Laterality Date   ADENOIDECTOMY     and tonsils (as a child)   BIOPSY  08/27/2021   Procedure: BIOPSY;  Surgeon: Ace Holder, MD;  Location: [facility];  Service: Gastroenterology;;   boil  2003   right elbow   CESAREAN SECTION     x4   CESAREAN SECTION  06/02/2011   Procedure: CESAREAN SECTION;  Surgeon: Albino Hum, MD;  Location: [facility];  Service: Gynecology;  Laterality: N/A;  Primary Cesarean Section Delivery Baby Boy @ 0004, Apgars 9/9   CESAREAN SECTION N/A 12/10/2013   Procedure: REPEAT CESAREAN SECTION;  Surgeon: Verlyn Goad,  MD;  Location: [facility];  Service: Obstetrics;  Laterality: N/A;   CESAREAN SECTION     colonoscopy  11/23/2018   stated had issues with sleep when in for procedure   ESOPHAGOGASTRODUODENOSCOPY (EGD) WITH PROPOFOL  N/A 08/27/2021   Procedure: ESOPHAGOGASTRODUODENOSCOPY (EGD) WITH PROPOFOL ;  Surgeon: Ace Holder, MD;  Location: [facility];  Service: Gastroenterology;  Laterality: N/A;   OTHER SURGICAL HISTORY  2005   Uterine surgery    uterine cauterization     Patient Active Problem List   Diagnosis Date Noted   Dislocation of jaw 07/23/2022   Small vessel disease (HCC) 06/29/2022   Type 2 diabetes mellitus with obesity (HCC) 03/25/2022   Hypotension - iatrogenic 03/18/2022   Other chronic pain 02/02/2022   Dyspepsia    Early satiety    IDA (iron deficiency anemia) 01/29/2021   Paroxysmal supraventricular tachycardia (HCC) 10/01/2020   Class 1 obesity with serious comorbidity and body mass index (BMI) of 31.0 to 31.9 in adult 10/01/2020   Diabetes mellitus (HCC) 02/14/2020   Mixed diabetic hyperlipidemia associated with type 2 diabetes mellitus (HCC) 01/10/2020   Hypertension associated with diabetes (HCC) 01/10/2020   Diabetic neuropathy (  HCC) 01/10/2020   Vitamin D  deficiency 01/10/2020   Diabetes mellitus with proteinuria (HCC) 08/20/2019   History of Helicobacter pylori infection 08/08/2019   Proteinuria 06/14/2019   Elevated sed rate 05/23/2019   Elevated C-reactive protein (CRP) 05/23/2019   Diabetes mellitus without complication (HCC) 05/23/2019   Dyslipidemia (high LDL; low HDL) 05/23/2019   Type 2 diabetes mellitus with hyperglycemia, without long-term current use of insulin  (HCC) 02/17/2019   Hemoglobin A1C between 7% and 9% indicating borderline diabetic control (HCC) 02/17/2019   Breast cancer screening by mammogram 02/17/2019   Frequent headaches 02/14/2019   Anxiety 02/14/2019   Common migraine with intractable migraine 06/28/2017   Chest pain at rest  10/17/2016   Sinus tachycardia 08/18/2016   Prolonged Q-T interval on ECG 08/18/2016   Vertigo 07/22/2015   Abdominal discomfort 02/14/2015   Low back pain 02/14/2015   Abdominal pain 02/01/2015   Nausea without vomiting 02/01/2015   Environmental allergies 02/01/2015   Language barrier to communication 02/01/2015   Anemia 01/23/2015   Constipation 01/21/2015   Knee pain, bilateral 01/21/2015   Palpitations 09/18/2013   Shortness of breath 09/18/2013   Advanced maternal age in pregnancy in second trimester 06/19/2013   IBS (irritable bowel syndrome) 04/13/2003    PCP: Jerrlyn Morel, NP  REFERRING PROVIDER: Liam Redhead, MD  REFERRING DIAG: 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   Hand dominance: Right  PERTINENT HISTORY:  Relevant PMHx includes anemia, anxiety, B12 deficiency, DM, HTN, IBS, DOE, tachycardia, diabetic neuropathy   PAIN:  Are you having pain?  Yes: NPRS scale: 8/10 Pain location: BIL neck/shoulder  Pain description: "pain"  Aggravating factors: looking over her shoulder, looking up to the ceiling, turning head quickly  Relieving factors: trigger point injections (a little improvement), hot pack, pain killer    PRECAUTIONS: None  RED FLAGS: None     WEIGHT BEARING RESTRICTIONS: No  FALLS:  Has patient fallen in last 6 months? No  LIVING ENVIRONMENT: Lives with: lives with their family Stairs: No  OCCUPATION:  Homemaking  PLOF: Independent  PATIENT GOALS: To get rid of the pain completely  NEXT MD VISIT: 04/21/2023  OBJECTIVE:  Note: Objective measures were completed at Evaluation unless otherwise noted.  DIAGNOSTIC FINDINGS:  04/09/2022 Thoracic Spine -- Negative   04/09/2022 Right shoulder -- Negative   PATIENT SURVEYS:  FOTO 47 current, 57 predicted   COGNITION: Overall cognitive status: Within functional limits for tasks assessed  SENSATION: Not tested  POSTURE: forward head  CERVICAL ROM:   Active ROM A/PROM (deg) eval  Flexion WFL  Extension WFL, p!  Right lateral flexion WFL  Left lateral flexion WFL , P!  Right rotation WFL  Left rotation WFL   (Blank rows = not tested)  UPPER EXTREMITY ROM:  Active ROM Right eval Left eval  Shoulder flexion [facility]   Shoulder extension    Shoulder abduction WFL, p! [facility]  Shoulder adduction    Shoulder extension    Shoulder internal rotation    Shoulder external rotation    Elbow flexion    Elbow extension    Wrist flexion    Wrist extension    Wrist ulnar deviation    Wrist radial deviation    Wrist pronation    Wrist supination     (Blank rows = not tested)  UPPER EXTREMITY MMT:  MMT Right eval Left eval  Shoulder flexion 4- 4-  Shoulder extension    Shoulder abduction 4- 4-  Shoulder adduction    Shoulder extension    Shoulder internal rotation    Shoulder external rotation    Middle trapezius 3+ 3+  Lower trapezius 3+ 3+  Elbow flexion    Elbow extension    Wrist flexion    Wrist extension    Wrist ulnar deviation    Wrist radial deviation    Wrist pronation    Wrist supination    Grip strength     (Blank rows = not tested)    TODAY'S TREATMENT:    [facility] Adult PT Treatment:                                                DATE: 04/27/23 Therapeutic Exercise: UBE level 1 x 6 mins fwd Rows RTB 2x10 Shoulder extension YTB 2x10 - cues to loosen grip as she reports pain in hands ER/IR BIL  isometric using column with towel 5" hold x10 ea Seated BIL ER with scap retraction YTB 2x10 Seated horizontal abduction YTB 2x10 Seated ITYs no weight <90 x10 ea (add 1# next session)   [facility] Adult PT Treatment:                                                DATE: 04/26/23  Therapeutic Exercise: UBE level 1 3'/3' fwd/bwd (bwd more painful) Rows RTB 2x10 Shoulder extension YTB 2x10 ER/IR BIL YTB 2 x10 BIL Seated BIL ER with scap retraction YTB 2x10 Seated horizontal abduction YTB 2x10 Seated chin tucks 2x10 Manual: STM BIL upper traps, 2 x 10  Manual upper trap stretch x 2    [facility] Adult PT Treatment:  DATE: 04/20/23 Therapeutic Exercise: UBE level 1 3'/3' fwd/bwd (bwd more painful) Rows RTB 2x10 Shoulder extension YTB 2x10 ER/IR BIL YTB x10 BIL Seated BIL ER with scap retraction YTB 2x10 Seated horizontal abduction YTB 2x10 Seated chin tucks 2x10 Manual: STM BIL upper traps Positional release BIL upper traps (gentle)                                                                                                    PATIENT EDUCATION:  Education details: reviewed initial home exercise program; discussion of POC, prognosis and goals for skilled PT   Person educated: Patient Education method: Explanation, Demonstration, and Handouts Education comprehension: verbalized understanding  HOME EXERCISE PROGRAM: Access Code: [email or web address] URL: [email or web address] Date: 04/01/2023 Prepared by: Arlester Bence  Exercises - Supine Cervical Rotation AROM on Pillow  - 2 x daily - 7 x weekly - 1 sets - 10 reps - Supine Cervical Retraction with Towel  - 2 x daily - 7 x weekly - 1 sets - 15-20 reps - 3 sec hold - Seated Scapular Retraction  - 2 x daily - 7 x weekly - 1 sets - 15-20 reps - 3 sec hold  ASSESSMENT:  CLINICAL IMPRESSION: Patient presents to PT reporting that she is feeling better overall with her pain and dizziness.  Session today continued to focus on periscapular and RTC strengthening. She did well with ER/IR isometrics today, no increase in pain reported, only muscular fatigue. Reinforced the difference between muscular fatigue and pain today with patient reporting understanding. Patient was able to tolerate all prescribed exercises with no adverse effects. Patient continues to benefit from skilled PT services and should be progressed as able to improve functional independence.    EVAL: Ryilee is a 48 y.o. female who was seen today for physical therapy evaluation and treatment for Neck Pain with Strength Deficits. She is also limited by dizziness with likely presence of positional vertigo. She would benefit from evaluation and treatment of these symptoms as well. She is demonstrating pain with end range cervical extension and left sidebending, she has diminished shoulder and periscapular strength. She also has moderate tenderness to palpation along L>R CS musculature, and pain that decreases with manual cervical distraction. She has related pain and difficulty with looking up, looking over her shoulder and performing heavy household activities. She requires skilled PT services at this time to address relevant deficits and improve overall function.     OBJECTIVE IMPAIRMENTS: decreased activity tolerance, decreased endurance, decreased mobility, decreased ROM, decreased strength, impaired UE functional use, improper body mechanics, postural dysfunction, and pain.   ACTIVITY LIMITATIONS: carrying, lifting, sleeping, reach over head, hygiene/grooming, and caring for others  PARTICIPATION LIMITATIONS: meal prep, cleaning, driving, and community activity  PERSONAL FACTORS: Age, Fitness, Past/current experiences, Time since onset of injury/illness/exacerbation, and 3+ comorbidities: Relevant PMHx includes anemia, anxiety, B12 deficiency, DM, HTN, IBS, DOE, tachycardia, diabetic neuropathy   are also affecting patient's  functional outcome.   REHAB POTENTIAL: Fair    CLINICAL DECISION MAKING: Evolving/moderate complexity  EVALUATION COMPLEXITY: Moderate   GOALS: Goals reviewed with patient? Yes  SHORT TERM GOALS: Target date: 04/22/2023   Patient will be independent with initial home program for cervical ROM and periscapular strengthening.  Baseline: provided at eval  Goal status: Ongoing  2.  Patient will demonstrate improved postural awareness for at least 15 minutes while seated without need for cueing from PT.   Baseline: see objective measures  Goal status: Ongoing   LONG TERM GOALS: Target date: 05/13/2023   Patient will report improved overall functional ability with FOTO score of 55 or greater Baseline: 47 Goal status: INITIAL  2.  Patient will demonstrate ability to perform CS AROM WFL in all directions with no more than 3/10 pain.  Baseline: see objective measures  Goal status: INITIAL  3.  Patient will demonstrate improved shoulder and periscapular MMT scores to at least 4+/5 in order to improved tolerance of ADLs and IADLs.  Baseline: see objective measures  Goal status: INITIAL  4.  Patient will demonstrate ability to perform overhead lifting of at least 10# using appropriate body mechanics and with no more than minimal pain in order to safely perform normal daily/occupational tasks.   Baseline: unable to tolerate d/t pain  Goal status: INITIAL  5.  Patient will demonstrate ability to perform floor to waist lifting of at least 25# using appropriate body mechanics and with no more than minimal pain in order to safely perform normal daily/occupational tasks.   Baseline: unable to tolerate d/t pain  Goal status: INITIAL   PLAN:  PT FREQUENCY: 2x/week  PT DURATION: 6 weeks  PLANNED INTERVENTIONS: 97164- PT Re-evaluation, 97110-Therapeutic exercises, 97530- Therapeutic activity, 97112- Neuromuscular re-education, 97535- Self Care, [id number]- Manual therapy, [id number]- Electrical  stimulation (manual), Patient/Family education, Taping, Dry Needling, Joint mobilization, Spinal mobilization, Cryotherapy, and Moist heat  PLAN FOR NEXT SESSION: address CS ROM, periscapular strengthening, thoracic mobility, isometric-> concentric shoulder strengthening.    Anna Kettering PTA 04/27/2023 9:56 AM

## 2023-04-29 ENCOUNTER — Other Ambulatory Visit: Payer: Self-pay | Admitting: Physical Medicine and Rehabilitation

## 2023-04-29 DIAGNOSIS — R42 Dizziness and giddiness: Secondary | ICD-10-CM

## 2023-05-05 ENCOUNTER — Ambulatory Visit: Payer: Medicaid Other

## 2023-05-05 DIAGNOSIS — M6281 Muscle weakness (generalized): Secondary | ICD-10-CM | POA: Diagnosis not present

## 2023-05-05 DIAGNOSIS — R42 Dizziness and giddiness: Secondary | ICD-10-CM

## 2023-05-05 DIAGNOSIS — M546 Pain in thoracic spine: Secondary | ICD-10-CM | POA: Diagnosis not present

## 2023-05-05 DIAGNOSIS — M542 Cervicalgia: Secondary | ICD-10-CM | POA: Diagnosis not present

## 2023-05-05 NOTE — Therapy (Addendum)
OUTPATIENT PHYSICAL THERAPY TREATMENT NOTE/RECERT   Patient Name: Betty Cain MRN: [id number] DOB:17-Jan-1976, 48 y.o., female Today's Date: 05/05/2023  END OF SESSION:  PT End of Session - 05/05/23 1139     Visit Number 5    Date for PT Re-Evaluation 06/03/23    Authorization Type [employer]    Authorization Time Period 7 visits approved 04/04/23-06/02/23    Authorization - Visit Number 4    Authorization - Number of Visits 7    PT Start Time 1055    PT Stop Time 1133    PT Time Calculation (min) 38 min    Activity Tolerance Patient tolerated treatment well;Patient limited by pain    Behavior During Therapy [facility] for tasks assessed/performed              Past Medical History:  Diagnosis Date   Allergy    Anemia    Anxiety 01/2019   Asthma    B12 deficiency    Back pain    Common migraine with intractable migraine 06/28/2017   Constipation    Diabetes (HCC) 02/2019   Fatigue    GERD (gastroesophageal reflux disease)    pos H pylori   Hypertension    controlled with medication   IBS (irritable bowel syndrome) 2005   Joint pain    Neonatal death    Vaginal delivery, full term-lived x2 hours.    Palpitations    Shortness of breath    Shortness of breath on exertion    Spinal headache    Swelling of both lower extremities    Valvular heart disease    Vitamin D deficiency 02/2019   Past Surgical History:  Procedure Laterality Date   ADENOIDECTOMY     and tonsils (as a child)   BIOPSY  08/27/2021   Procedure: BIOPSY;  Surgeon: Benancio Deeds, MD;  Location: [facility];  Service: Gastroenterology;;   boil  2003   right elbow   CESAREAN SECTION     x4   CESAREAN SECTION  06/02/2011   Procedure: CESAREAN SECTION;  Surgeon: Tilda Burrow, MD;  Location: [facility];  Service: Gynecology;  Laterality: N/A;  Primary Cesarean Section Delivery Baby Boy @ 0004, Apgars 9/9   CESAREAN SECTION N/A 12/10/2013   Procedure: REPEAT CESAREAN SECTION;  Surgeon: Catalina Antigua, MD;  Location: [facility];  Service: Obstetrics;  Laterality: N/A;   CESAREAN SECTION     colonoscopy  11/23/2018   stated had issues with sleep when in for procedure   ESOPHAGOGASTRODUODENOSCOPY (EGD) WITH PROPOFOL N/A 08/27/2021   Procedure: ESOPHAGOGASTRODUODENOSCOPY (EGD) WITH PROPOFOL;  Surgeon: Benancio Deeds, MD;  Location: [facility];  Service: Gastroenterology;  Laterality: N/A;   OTHER SURGICAL HISTORY  2005   Uterine surgery    uterine cauterization     Patient Active Problem List   Diagnosis Date Noted   Dislocation of jaw 07/23/2022   Small vessel disease (HCC) 06/29/2022   Type 2 diabetes mellitus with obesity (HCC) 03/25/2022   Hypotension - iatrogenic 03/18/2022   Other chronic pain 02/02/2022   Dyspepsia    Early satiety    IDA (iron deficiency anemia) 01/29/2021   Paroxysmal supraventricular tachycardia (HCC) 10/01/2020   Class 1 obesity with serious comorbidity and body mass index (BMI) of 31.0 to 31.9 in adult 10/01/2020   Diabetes mellitus (HCC) 02/14/2020   Mixed diabetic hyperlipidemia associated with type 2 diabetes mellitus (HCC) 01/10/2020   Hypertension associated with diabetes (HCC) 01/10/2020   Diabetic  neuropathy (HCC) 01/10/2020   Vitamin D deficiency 01/10/2020   Diabetes mellitus with proteinuria (HCC) 08/20/2019   History of Helicobacter pylori infection 08/08/2019   Proteinuria 06/14/2019   Elevated sed rate 05/23/2019   Elevated C-reactive protein (CRP) 05/23/2019   Diabetes mellitus without complication (HCC) 05/23/2019   Dyslipidemia (high LDL; low HDL) 05/23/2019   Type 2 diabetes mellitus with hyperglycemia, without long-term current use of insulin (HCC) 02/17/2019   Hemoglobin A1C between 7% and 9% indicating borderline diabetic control (HCC) 02/17/2019   Breast cancer screening by mammogram 02/17/2019   Frequent headaches 02/14/2019   Anxiety 02/14/2019   Common migraine with intractable migraine 06/28/2017   Chest pain at  rest 10/17/2016   Sinus tachycardia 08/18/2016   Prolonged Q-T interval on ECG 08/18/2016   Vertigo 07/22/2015   Abdominal discomfort 02/14/2015   Low back pain 02/14/2015   Abdominal pain 02/01/2015   Nausea without vomiting 02/01/2015   Environmental allergies 02/01/2015   Language barrier to communication 02/01/2015   Anemia 01/23/2015   Constipation 01/21/2015   Knee pain, bilateral 01/21/2015   Palpitations 09/18/2013   Shortness of breath 09/18/2013   Advanced maternal age in pregnancy in second trimester 06/19/2013   IBS (irritable bowel syndrome) 04/13/2003    PCP: Ivonne Andrew, NP  REFERRING PROVIDER: Horton Chin, MD  REFERRING DIAG: Cervical myofascial pain syndrome [M79.18] R42 (ICD-10-CM) - Vertigo   THERAPY DIAG:  Cervicalgia  Dizziness and giddiness  Rationale for Evaluation and Treatment: Rehabilitation  ONSET DATE: 1+ year   SUBJECTIVE:                                                                                                                                                                                                         SUBJECTIVE STATEMENT: AMN Interpreter # [phone]  Patient reporting no worsening of neck pain at this time. She continues to have dizziness with changing positions that is intense, but brief in duration. She would like to have her vertigo symptoms evaluated and treated today.   Hand dominance: Right  PERTINENT HISTORY:  Relevant PMHx includes anemia, anxiety, B12 deficiency, DM, HTN, IBS, DOE, tachycardia, diabetic neuropathy   PAIN:  Are you having pain?  Yes: NPRS scale: 8/10 Pain location: BIL neck/shoulder  Pain description: "pain"  Aggravating factors: looking over her shoulder, looking up to the ceiling, turning head quickly  Relieving factors: trigger point injections (a little improvement), hot pack, pain killer    PRECAUTIONS: None  RED FLAGS: None     WEIGHT BEARING RESTRICTIONS: No  FALLS:   Has patient fallen in last 6 months? No  LIVING ENVIRONMENT: Lives with: lives with their family Stairs: No  OCCUPATION: Homemaking  PLOF: Independent  PATIENT GOALS: To get rid of the pain completely  NEXT MD VISIT: 04/21/2023  OBJECTIVE:  Note: Objective measures were completed at Evaluation unless otherwise noted.  DIAGNOSTIC FINDINGS:  04/09/2022 Thoracic Spine -- Negative   04/09/2022 Right shoulder -- Negative   PATIENT SURVEYS:  FOTO 47 current, 57 predicted   COGNITION: Overall cognitive status: Within functional limits for tasks assessed  SENSATION: Not tested  POSTURE: forward head  CERVICAL ROM:   Active ROM A/PROM (deg) eval  Flexion WFL  Extension WFL, p!  Right lateral flexion WFL  Left lateral flexion WFL , P!  Right rotation WFL  Left rotation WFL   (Blank rows = not tested)  UPPER EXTREMITY ROM:  Active ROM Right eval Left eval  Shoulder flexion [facility]   Shoulder extension    Shoulder abduction WFL, p! [facility]  Shoulder adduction    Shoulder extension    Shoulder internal rotation    Shoulder external rotation    Elbow flexion    Elbow extension    Wrist flexion    Wrist extension    Wrist ulnar deviation    Wrist radial deviation    Wrist pronation    Wrist supination     (Blank rows = not tested)  UPPER EXTREMITY MMT:  MMT Right eval Left eval  Shoulder flexion 4- 4-  Shoulder extension    Shoulder abduction 4- 4-  Shoulder adduction    Shoulder extension    Shoulder internal rotation    Shoulder external rotation    Middle trapezius 3+ 3+  Lower trapezius 3+ 3+  Elbow flexion    Elbow extension    Wrist flexion    Wrist extension    Wrist ulnar deviation    Wrist radial deviation    Wrist pronation    Wrist supination    Grip strength     (Blank rows = not tested)   VESTIBULAR ASSESSMENT performed 05/05/23:   GENERAL OBSERVATION: dizziness when performing supine to sit that last <1 minute; patient is  blinking and closing her eyes in effort to mitigate dizziness    SYMPTOM BEHAVIOR:  Subjective history: Patient has experienced vertigo symptoms every couple of years since 2017.   Non-Vestibular symptoms: neck pain and tinnitus  Type of dizziness: Spinning/Vertigo  Frequency: daily with bed mobility   Duration: < 1 min   Aggravating factors: Induced by position change: rolling to the right and supine to sit  Relieving factors: head stationary and closing eyes  Progression of symptoms: worse   POSITIONAL TESTING: Right Dix-Hallpike: dizziness, nystagmus and Duration: <20 seconds      TODAY'S TREATMENT:     [facility] Adult PT Treatment:                                                DATE: 05/06/23 Therapeutic Exercise: Rows GTB 2x10 Shoulder extension GTB 2x10 - cues to loosen grip as she reports pain in hands Isometric Er/IR walk outs 2 x 5 Seated horizontal abduction RTB 2x10  Therapeutic Activity Vestibular Assessment: See results as noted above  Self-Care for patient education following RIGHT Epley Maneuver RIGHT Epley Maneuver, followed by patient education for 4 hours aftercare (  precautions and symptom monitoring)    [facility] Adult PT Treatment:                                                DATE: 04/27/23 Therapeutic Exercise: UBE level 1 x 6 mins fwd Rows RTB 2x10 Shoulder extension YTB 2x10 - cues to loosen grip as she reports pain in hands ER/IR BIL isometric using column with towel 5" hold x10 ea Seated BIL ER with scap retraction YTB 2x10 Seated horizontal abduction YTB 2x10 Seated ITYs no weight <90 x10 ea (add 1# next session)                                                                                                       PATIENT EDUCATION:  Education details: reviewed initial home exercise program; discussion of POC, prognosis and goals for skilled PT   Person educated: Patient Education method: Explanation, Demonstration, and Handouts Education  comprehension: verbalized understanding  HOME EXERCISE PROGRAM: Access Code: [email or web address] URL: [email or web address] Date: 04/01/2023 Prepared by: Mauri Reading  Exercises - Supine Cervical Rotation AROM on Pillow  - 2 x daily - 7 x weekly - 1 sets - 10 reps - Supine Cervical Retraction with Towel  - 2 x daily - 7 x weekly - 1 sets - 15-20 reps - 3 sec hold - Seated Scapular Retraction  - 2 x daily - 7 x weekly - 1 sets - 15-20 reps - 3 sec hold  ASSESSMENT:  CLINICAL IMPRESSION: Patient has been making good progress with current POC for neck and shoulder pain. She will benefit from additional skilled PT 1-2x/week for 4 weeks to address remaining deficits, including use of manual therapy. Vestibular assessment was performed today d/t frequent dizziness for bed mobility during PT sessions. She had positive result with Right dix-hallpike. Performed Epley maneuver to alleviate theses symptoms and improved tolerance of manual therapy for cervical pain. Instructed patient regarding related aftercare. Plan is to reasess response to treatment at next session and continue with POC as tolerated.    OBJECTIVE IMPAIRMENTS: decreased activity tolerance, decreased endurance, decreased mobility, decreased ROM, decreased strength, impaired UE functional use, improper body mechanics, postural dysfunction, and pain.   ACTIVITY LIMITATIONS: carrying, lifting, sleeping, reach over head, hygiene/grooming, and caring for others  PARTICIPATION LIMITATIONS: meal prep, cleaning, driving, and community activity  PERSONAL FACTORS: Age, Fitness, Past/current experiences, Time since onset of injury/illness/exacerbation, and 3+ comorbidities: Relevant PMHx includes anemia, anxiety, B12 deficiency, DM, HTN, IBS, DOE, tachycardia, diabetic neuropathy   are also affecting patient's functional outcome.   REHAB POTENTIAL: Fair    CLINICAL DECISION MAKING: Evolving/moderate complexity  EVALUATION COMPLEXITY:  Moderate   GOALS: Goals reviewed with patient? Yes  SHORT TERM GOALS: Target date: 04/22/2023   Patient will be independent with initial home program for cervical ROM and periscapular strengthening.  Baseline: provided at eval  Goal status: Ongoing  2.  Patient will demonstrate improved postural awareness for  at least 15 minutes while seated without need for cueing from PT.   Baseline: see objective measures  Goal status: Ongoing   LONG TERM GOALS: Target date: 06/03/2023, last updated 05/05/23   Patient will report improved overall functional ability with FOTO score of 55 or greater Baseline: 47 Goal status: ONGOING  2.  Patient will demonstrate ability to perform CS AROM WFL in all directions with no more than 3/10 pain.  Baseline: see objective measures  Goal status: ONGOING  3.  Patient will demonstrate improved shoulder and periscapular MMT scores to at least 4+/5 in order to improved tolerance of ADLs and IADLs.  Baseline: see objective measures  Goal status: ONGOING  4.  Patient will demonstrate ability to perform overhead lifting of at least 10# using appropriate body mechanics and with no more than minimal pain in order to safely perform normal daily/occupational tasks.   Baseline: unable to tolerate d/t pain  Goal status: ONGOING  5.  Patient will demonstrate ability to perform floor to waist lifting of at least 25# using appropriate body mechanics and with no more than minimal pain in order to safely perform normal daily/occupational tasks.   Baseline: unable to tolerate d/t pain  Goal status: ONGOING  6. Patient will report absence of dizziness when performing supine to sit for at least 1 week.   Baseline: daily with position changes  Goal status: INITIAL    PLAN:  PT FREQUENCY: 1-2x/week   PT DURATION: 4 weeks, last updated 05/05/23  PLANNED INTERVENTIONS: [id number]- PT Re-evaluation, 97110-Therapeutic exercises, 97530- Therapeutic activity, 97112-  Neuromuscular re-education, 97535- Self Care, 16109- Manual therapy, [phone]- Canalith repositioning, [id number]- Electrical stimulation (manual), Patient/Family education, Taping, Dry Needling, Joint mobilization, Spinal mobilization, Cryotherapy, and Moist heat  PLAN FOR NEXT SESSION: address CS ROM, periscapular strengthening, thoracic mobility, isometric-> concentric shoulder strengthening; canalith repositioning as indicated   Mauri Reading, PT, DPT  05/06/2023 10:19 AM

## 2023-05-10 ENCOUNTER — Ambulatory Visit: Payer: Medicaid Other

## 2023-05-10 DIAGNOSIS — M542 Cervicalgia: Secondary | ICD-10-CM | POA: Diagnosis not present

## 2023-05-10 DIAGNOSIS — R42 Dizziness and giddiness: Secondary | ICD-10-CM | POA: Diagnosis not present

## 2023-05-10 DIAGNOSIS — M546 Pain in thoracic spine: Secondary | ICD-10-CM | POA: Diagnosis not present

## 2023-05-10 DIAGNOSIS — M6281 Muscle weakness (generalized): Secondary | ICD-10-CM | POA: Diagnosis not present

## 2023-05-10 NOTE — Therapy (Signed)
OUTPATIENT PHYSICAL THERAPY TREATMENT NOTE   Patient Name: Betty Cain MRN: [id number] DOB:[birth year], 48 y.o., female Today's Date: 05/10/2023   END OF SESSION:  PT End of Session - 05/10/23 0924     Visit Number 6    Date for PT Re-Evaluation 06/03/23    Authorization Type [employer]    Authorization Time Period 7 visits approved 04/04/23-06/02/23    Authorization - Visit Number 5    Authorization - Number of Visits 7    PT Start Time 0830    PT Stop Time 0908    PT Time Calculation (min) 38 min    Activity Tolerance Patient tolerated treatment well    Behavior During Therapy [facility] for tasks assessed/performed               Past Medical History:  Diagnosis Date   Allergy    Anemia    Anxiety 01/2019   Asthma    B12 deficiency    Back pain    Common migraine with intractable migraine 06/28/2017   Constipation    Diabetes (HCC) 02/2019   Fatigue    GERD (gastroesophageal reflux disease)    pos H pylori   Hypertension    controlled with medication   IBS (irritable bowel syndrome) 2005   Joint pain    Neonatal death    Vaginal delivery, full term-lived x2 hours.    Palpitations    Shortness of breath    Shortness of breath on exertion    Spinal headache    Swelling of both lower extremities    Valvular heart disease    Vitamin D deficiency 02/2019   Past Surgical History:  Procedure Laterality Date   ADENOIDECTOMY     and tonsils (as a child)   BIOPSY  08/27/2021   Procedure: BIOPSY;  Surgeon: Benancio Deeds, MD;  Location: [facility];  Service: Gastroenterology;;   boil  2003   right elbow   CESAREAN SECTION     x4   CESAREAN SECTION  06/02/2011   Procedure: CESAREAN SECTION;  Surgeon: Tilda Burrow, MD;  Location: [facility];  Service: Gynecology;  Laterality: N/A;  Primary Cesarean Section Delivery Baby Boy @ 0004, Apgars 9/9   CESAREAN SECTION N/A 12/10/2013   Procedure: REPEAT CESAREAN SECTION;  Surgeon: Catalina Antigua, MD;  Location: [facility];  Service: Obstetrics;  Laterality: N/A;   CESAREAN SECTION     colonoscopy  11/23/2018   stated had issues with sleep when in for procedure   ESOPHAGOGASTRODUODENOSCOPY (EGD) WITH PROPOFOL N/A 08/27/2021   Procedure: ESOPHAGOGASTRODUODENOSCOPY (EGD) WITH PROPOFOL;  Surgeon: Benancio Deeds, MD;  Location: [facility];  Service: Gastroenterology;  Laterality: N/A;   OTHER SURGICAL HISTORY  2005   Uterine surgery    uterine cauterization     Patient Active Problem List   Diagnosis Date Noted   Dislocation of jaw 07/23/2022   Small vessel disease (HCC) 06/29/2022   Type 2 diabetes mellitus with obesity (HCC) 03/25/2022   Hypotension - iatrogenic 03/18/2022   Other chronic pain 02/02/2022   Dyspepsia    Early satiety    IDA (iron deficiency anemia) 01/29/2021   Paroxysmal supraventricular tachycardia (HCC) 10/01/2020   Class 1 obesity with serious comorbidity and body mass index (BMI) of 31.0 to 31.9 in adult 10/01/2020   Diabetes mellitus (HCC) 02/14/2020   Mixed diabetic hyperlipidemia associated with type 2 diabetes mellitus (HCC) 01/10/2020   Hypertension associated with diabetes (HCC) 01/10/2020   Diabetic neuropathy (  HCC) 01/10/2020   Vitamin D deficiency 01/10/2020   Diabetes mellitus with proteinuria (HCC) 08/20/2019   History of Helicobacter pylori infection 08/08/2019   Proteinuria 06/14/2019   Elevated sed rate 05/23/2019   Elevated C-reactive protein (CRP) 05/23/2019   Diabetes mellitus without complication (HCC) 05/23/2019   Dyslipidemia (high LDL; low HDL) 05/23/2019   Type 2 diabetes mellitus with hyperglycemia, without long-term current use of insulin (HCC) 02/17/2019   Hemoglobin A1C between 7% and 9% indicating borderline diabetic control (HCC) 02/17/2019   Breast cancer screening by mammogram 02/17/2019   Frequent headaches 02/14/2019   Anxiety 02/14/2019   Common migraine with intractable migraine 06/28/2017   Chest pain at rest 10/17/2016   Sinus  tachycardia 08/18/2016   Prolonged Q-T interval on ECG 08/18/2016   Vertigo 07/22/2015   Abdominal discomfort 02/14/2015   Low back pain 02/14/2015   Abdominal pain 02/01/2015   Nausea without vomiting 02/01/2015   Environmental allergies 02/01/2015   Language barrier to communication 02/01/2015   Anemia 01/23/2015   Constipation 01/21/2015   Knee pain, bilateral 01/21/2015   Palpitations 09/18/2013   Shortness of breath 09/18/2013   Advanced maternal age in pregnancy in second trimester 06/19/2013   IBS (irritable bowel syndrome) 04/13/2003    PCP: Ivonne Andrew, NP  REFERRING PROVIDER: Horton Chin, MD  REFERRING DIAG: Cervical myofascial pain syndrome [M79.18] R42 (ICD-10-CM) - Vertigo   THERAPY DIAG:  Cervicalgia  Dizziness and giddiness  Rationale for Evaluation and Treatment: Rehabilitation  ONSET DATE: 1+ year   SUBJECTIVE:                                                                                                                                                                                                         SUBJECTIVE STATEMENT: AMN Interpreter # [id number]  Patient reporting no dizziness since last visit. However, she does have worse neck pain over the past couple of days.    Hand dominance: Right  PERTINENT HISTORY:  Relevant PMHx includes anemia, anxiety, B12 deficiency, DM, HTN, IBS, DOE, tachycardia, diabetic neuropathy   PAIN:  Are you having pain?  Yes: NPRS scale: 8/10 Pain location: BIL neck/shoulder  Pain description: "pain"  Aggravating factors: looking over her shoulder, looking up to the ceiling, turning head quickly  Relieving factors: trigger point injections (a little improvement), hot pack, pain killer    PRECAUTIONS: None  RED FLAGS: None     WEIGHT BEARING RESTRICTIONS: No  FALLS:  Has patient fallen in last 6 months? No  LIVING ENVIRONMENT: Lives with: lives with  their family Stairs: No  OCCUPATION:  Homemaking  PLOF: Independent  PATIENT GOALS: To get rid of the pain completely  NEXT MD VISIT: 04/21/2023  OBJECTIVE:  Note: Objective measures were completed at Evaluation unless otherwise noted.  DIAGNOSTIC FINDINGS:  04/09/2022 Thoracic Spine -- Negative   04/09/2022 Right shoulder -- Negative   PATIENT SURVEYS:  FOTO 47 current, 57 predicted   COGNITION: Overall cognitive status: Within functional limits for tasks assessed  SENSATION: Not tested  POSTURE: forward head  CERVICAL ROM:   Active ROM A/PROM (deg) eval  Flexion WFL  Extension WFL, p!  Right lateral flexion WFL  Left lateral flexion WFL , P!  Right rotation WFL  Left rotation WFL   (Blank rows = not tested)  UPPER EXTREMITY ROM:  Active ROM Right eval Left eval  Shoulder flexion [facility]   Shoulder extension    Shoulder abduction WFL, p! [facility]  Shoulder adduction    Shoulder extension    Shoulder internal rotation    Shoulder external rotation    Elbow flexion    Elbow extension    Wrist flexion    Wrist extension    Wrist ulnar deviation    Wrist radial deviation    Wrist pronation    Wrist supination     (Blank rows = not tested)  UPPER EXTREMITY MMT:  MMT Right eval Left eval  Shoulder flexion 4- 4-  Shoulder extension    Shoulder abduction 4- 4-  Shoulder adduction    Shoulder extension    Shoulder internal rotation    Shoulder external rotation    Middle trapezius 3+ 3+  Lower trapezius 3+ 3+  Elbow flexion    Elbow extension    Wrist flexion    Wrist extension    Wrist ulnar deviation    Wrist radial deviation    Wrist pronation    Wrist supination    Grip strength     (Blank rows = not tested)   VESTIBULAR ASSESSMENT performed 05/05/23:   GENERAL OBSERVATION: dizziness when performing supine to sit that last <1 minute; patient is blinking and closing her eyes in effort to mitigate dizziness    SYMPTOM BEHAVIOR:  Subjective history: Patient has experienced  vertigo symptoms every couple of years since 2017.   Non-Vestibular symptoms: neck pain and tinnitus  Type of dizziness: Spinning/Vertigo  Frequency: daily with bed mobility   Duration: < 1 min   Aggravating factors: Induced by position change: rolling to the right and supine to sit  Relieving factors: head stationary and closing eyes  Progression of symptoms: worse   POSITIONAL TESTING: Right Dix-Hallpike: dizziness, nystagmus and Duration: <20 seconds      TODAY'S TREATMENT:     [facility] Adult PT Treatment:                                                DATE: 05/10/2023  Therapeutic Exercise: UBE level 1 x 2 min fwd/25min back  Cervical retraction with ball, 3 sec x 20  UT 2 x 30 sec each  Levator 2 x 30 sec each Supine rotation AROM x 10 each side  Rows RTB 2x10 Shoulder extension RTB 2x10  Updated and reviewed HEP to include all exercises performed today   Manual Therapy: STM BIL suboccipital release, BIL UT, levator mm Passive stretch of BIL UT and levator  mm   [facility] Adult PT Treatment:                                                DATE: 05/06/23 Therapeutic Exercise: Rows GTB 2x10 Shoulder extension GTB 2x10 - cues to loosen grip as she reports pain in hands Isometric Er/IR walk outs 2 x 5 Seated horizontal abduction RTB 2x10  Therapeutic Activity Vestibular Assessment: See results as noted above  Self-Care for patient education following RIGHT Epley Maneuver RIGHT Epley Maneuver, followed by patient education for 4 hours aftercare (precautions and symptom monitoring)    [facility] Adult PT Treatment:                                                DATE: 04/27/23 Therapeutic Exercise: UBE level 1 x 6 mins fwd Rows RTB 2x10 Shoulder extension YTB 2x10 - cues to loosen grip as she reports pain in hands ER/IR BIL isometric using column with towel 5" hold x10 ea Seated BIL ER with scap retraction YTB 2x10 Seated horizontal abduction YTB 2x10 Seated ITYs no weight <90 x10 ea  (add 1# next session)                                                                                                       PATIENT EDUCATION:  Education details: reviewed initial home exercise program; discussion of POC, prognosis and goals for skilled PT   Person educated: Patient Education method: Explanation, Demonstration, and Handouts Education comprehension: verbalized understanding  HOME EXERCISE PROGRAM: Access Code: [email or web address] URL: [email or web address] Date: 04/01/2023 Prepared by: Mauri Reading  Exercises - Supine Cervical Rotation AROM on Pillow  - 2 x daily - 7 x weekly - 1 sets - 10 reps - Supine Cervical Retraction with Towel  - 2 x daily - 7 x weekly - 1 sets - 15-20 reps - 3 sec hold - Seated Scapular Retraction  - 2 x daily - 7 x weekly - 1 sets - 15-20 reps - 3 sec hold  ASSESSMENT:  CLINICAL IMPRESSION: She did have dizziness when performing supine-to-sit following manual therapy, that is brief in duration and mild per patient reports. We will continue to monitor and perform Epley maneuver next week if dizziness persists. Provided patient with updated HEP and instructed to try new exercises today so that any questions may be addressed at tomorrows treatment session. She did report pain with resisted shoulder extension at end of session, which sounds like exercise-related "muscle burn". We will continue with current POC as tolerated.     OBJECTIVE IMPAIRMENTS: decreased activity tolerance, decreased endurance, decreased mobility, decreased ROM, decreased strength, impaired UE functional use, improper body mechanics, postural dysfunction, and pain.   ACTIVITY LIMITATIONS: carrying, lifting, sleeping, reach over head, hygiene/grooming, and caring for others  PARTICIPATION LIMITATIONS: meal prep,  cleaning, driving, and community activity  PERSONAL FACTORS: Age, Fitness, Past/current experiences, Time since onset of injury/illness/exacerbation, and 3+  comorbidities: Relevant PMHx includes anemia, anxiety, B12 deficiency, DM, HTN, IBS, DOE, tachycardia, diabetic neuropathy   are also affecting patient's functional outcome.   REHAB POTENTIAL: Fair    CLINICAL DECISION MAKING: Evolving/moderate complexity  EVALUATION COMPLEXITY: Moderate   GOALS: Goals reviewed with patient? Yes  SHORT TERM GOALS: Target date: 04/22/2023   Patient will be independent with initial home program for cervical ROM and periscapular strengthening.  Baseline: provided at eval  Goal status: Ongoing  2.  Patient will demonstrate improved postural awareness for at least 15 minutes while seated without need for cueing from PT.   Baseline: see objective measures  Goal status: Ongoing   LONG TERM GOALS: Target date: 06/03/2023, last updated 05/05/23   Patient will report improved overall functional ability with FOTO score of 55 or greater Baseline: 47 Goal status: ONGOING  2.  Patient will demonstrate ability to perform CS AROM WFL in all directions with no more than 3/10 pain.  Baseline: see objective measures  Goal status: ONGOING  3.  Patient will demonstrate improved shoulder and periscapular MMT scores to at least 4+/5 in order to improved tolerance of ADLs and IADLs.  Baseline: see objective measures  Goal status: ONGOING  4.  Patient will demonstrate ability to perform overhead lifting of at least 10# using appropriate body mechanics and with no more than minimal pain in order to safely perform normal daily/occupational tasks.   Baseline: unable to tolerate d/t pain  Goal status: ONGOING  5.  Patient will demonstrate ability to perform floor to waist lifting of at least 25# using appropriate body mechanics and with no more than minimal pain in order to safely perform normal daily/occupational tasks.   Baseline: unable to tolerate d/t pain  Goal status: ONGOING  6. Patient will report absence of dizziness when performing supine to sit for at least  1 week.   Baseline: daily with position changes  Goal status: INITIAL    PLAN:  PT FREQUENCY: 1-2x/week   PT DURATION: 4 weeks, last updated 05/05/23  PLANNED INTERVENTIONS: [id number]- PT Re-evaluation, 97110-Therapeutic exercises, 97530- Therapeutic activity, 97112- Neuromuscular re-education, 97535- Self Care, [id number]- Manual therapy, [phone]- Canalith repositioning, [id number]- Electrical stimulation (manual), Patient/Family education, Taping, Dry Needling, Joint mobilization, Spinal mobilization, Cryotherapy, and Moist heat  PLAN FOR NEXT SESSION: address CS ROM, periscapular strengthening, thoracic mobility, isometric-> concentric shoulder strengthening; canalith repositioning as indicated   Mauri Reading, PT, DPT  05/10/2023 9:26 AM

## 2023-05-11 ENCOUNTER — Ambulatory Visit: Payer: Medicaid Other

## 2023-05-11 ENCOUNTER — Other Ambulatory Visit: Payer: Self-pay | Admitting: Nurse Practitioner

## 2023-05-11 DIAGNOSIS — M6281 Muscle weakness (generalized): Secondary | ICD-10-CM

## 2023-05-11 DIAGNOSIS — M542 Cervicalgia: Secondary | ICD-10-CM

## 2023-05-11 DIAGNOSIS — M546 Pain in thoracic spine: Secondary | ICD-10-CM | POA: Diagnosis not present

## 2023-05-11 DIAGNOSIS — R42 Dizziness and giddiness: Secondary | ICD-10-CM | POA: Diagnosis not present

## 2023-05-11 NOTE — Telephone Encounter (Signed)
Please advise [facility]

## 2023-05-11 NOTE — Therapy (Signed)
OUTPATIENT PHYSICAL THERAPY TREATMENT NOTE   Patient Name: Betty Cain MRN: [id number] DOB:May 24, 1975, 48 y.o., female Today's Date: 05/11/2023   END OF SESSION:  PT End of Session - 05/11/23 0825     Visit Number 7    Date for PT Re-Evaluation 06/03/23    Authorization Type [employer]    Authorization Time Period 7 visits approved 04/04/23-06/02/23    Authorization - Visit Number 6    Authorization - Number of Visits 7    PT Start Time 0830    PT Stop Time 0908    PT Time Calculation (min) 38 min    Activity Tolerance Patient tolerated treatment well    Behavior During Therapy [facility] for tasks assessed/performed            Past Medical History:  Diagnosis Date   Allergy    Anemia    Anxiety 01/2019   Asthma    B12 deficiency    Back pain    Common migraine with intractable migraine 06/28/2017   Constipation    Diabetes (HCC) 02/2019   Fatigue    GERD (gastroesophageal reflux disease)    pos H pylori   Hypertension    controlled with medication   IBS (irritable bowel syndrome) 2005   Joint pain    Neonatal death    Vaginal delivery, full term-lived x2 hours.    Palpitations    Shortness of breath    Shortness of breath on exertion    Spinal headache    Swelling of both lower extremities    Valvular heart disease    Vitamin D deficiency 02/2019   Past Surgical History:  Procedure Laterality Date   ADENOIDECTOMY     and tonsils (as a child)   BIOPSY  08/27/2021   Procedure: BIOPSY;  Surgeon: Benancio Deeds, MD;  Location: [facility];  Service: Gastroenterology;;   boil  2003   right elbow   CESAREAN SECTION     x4   CESAREAN SECTION  06/02/2011   Procedure: CESAREAN SECTION;  Surgeon: Tilda Burrow, MD;  Location: [facility];  Service: Gynecology;  Laterality: N/A;  Primary Cesarean Section Delivery Baby Boy @ 0004, Apgars 9/9   CESAREAN SECTION N/A 12/10/2013   Procedure: REPEAT CESAREAN SECTION;  Surgeon: Catalina Antigua, MD;  Location: [facility];   Service: Obstetrics;  Laterality: N/A;   CESAREAN SECTION     colonoscopy  11/23/2018   stated had issues with sleep when in for procedure   ESOPHAGOGASTRODUODENOSCOPY (EGD) WITH PROPOFOL N/A 08/27/2021   Procedure: ESOPHAGOGASTRODUODENOSCOPY (EGD) WITH PROPOFOL;  Surgeon: Benancio Deeds, MD;  Location: [facility];  Service: Gastroenterology;  Laterality: N/A;   OTHER SURGICAL HISTORY  2005   Uterine surgery    uterine cauterization     Patient Active Problem List   Diagnosis Date Noted   Dislocation of jaw 07/23/2022   Small vessel disease (HCC) 06/29/2022   Type 2 diabetes mellitus with obesity (HCC) 03/25/2022   Hypotension - iatrogenic 03/18/2022   Other chronic pain 02/02/2022   Dyspepsia    Early satiety    IDA (iron deficiency anemia) 01/29/2021   Paroxysmal supraventricular tachycardia (HCC) 10/01/2020   Class 1 obesity with serious comorbidity and body mass index (BMI) of 31.0 to 31.9 in adult 10/01/2020   Diabetes mellitus (HCC) 02/14/2020   Mixed diabetic hyperlipidemia associated with type 2 diabetes mellitus (HCC) 01/10/2020   Hypertension associated with diabetes (HCC) 01/10/2020   Diabetic neuropathy (HCC) 01/10/2020  Vitamin D deficiency 01/10/2020   Diabetes mellitus with proteinuria (HCC) 08/20/2019   History of Helicobacter pylori infection 08/08/2019   Proteinuria 06/14/2019   Elevated sed rate 05/23/2019   Elevated C-reactive protein (CRP) 05/23/2019   Diabetes mellitus without complication (HCC) 05/23/2019   Dyslipidemia (high LDL; low HDL) 05/23/2019   Type 2 diabetes mellitus with hyperglycemia, without long-term current use of insulin (HCC) 02/17/2019   Hemoglobin A1C between 7% and 9% indicating borderline diabetic control (HCC) 02/17/2019   Breast cancer screening by mammogram 02/17/2019   Frequent headaches 02/14/2019   Anxiety 02/14/2019   Common migraine with intractable migraine 06/28/2017   Chest pain at rest 10/17/2016   Sinus  tachycardia 08/18/2016   Prolonged Q-T interval on ECG 08/18/2016   Vertigo 07/22/2015   Abdominal discomfort 02/14/2015   Low back pain 02/14/2015   Abdominal pain 02/01/2015   Nausea without vomiting 02/01/2015   Environmental allergies 02/01/2015   Language barrier to communication 02/01/2015   Anemia 01/23/2015   Constipation 01/21/2015   Knee pain, bilateral 01/21/2015   Palpitations 09/18/2013   Shortness of breath 09/18/2013   Advanced maternal age in pregnancy in second trimester 06/19/2013   IBS (irritable bowel syndrome) 04/13/2003    PCP: Ivonne Andrew, NP  REFERRING PROVIDER: Horton Chin, MD  REFERRING DIAG: Cervical myofascial pain syndrome [M79.18] R42 (ICD-10-CM) - Vertigo   THERAPY DIAG:  Cervicalgia  Pain in thoracic spine  Muscle weakness (generalized)  Rationale for Evaluation and Treatment: Rehabilitation  ONSET DATE: 1+ year   SUBJECTIVE:                                                                                                                                                                                                         SUBJECTIVE STATEMENT: AMN Interpreter # [phone]  Patient reports that her pain and dizziness are lower today than they were yesterday.   Hand dominance: Right  PERTINENT HISTORY:  Relevant PMHx includes anemia, anxiety, B12 deficiency, DM, HTN, IBS, DOE, tachycardia, diabetic neuropathy   PAIN:  Are you having pain?  Yes: NPRS scale: 8/10 Pain location: BIL neck/shoulder  Pain description: "pain"  Aggravating factors: looking over her shoulder, looking up to the ceiling, turning head quickly  Relieving factors: trigger point injections (a little improvement), hot pack, pain killer    PRECAUTIONS: None  RED FLAGS: None     WEIGHT BEARING RESTRICTIONS: No  FALLS:  Has patient fallen in last 6 months? No  LIVING ENVIRONMENT: Lives with: lives with their family Stairs: No  OCCUPATION:  Homemaking  PLOF: Independent  PATIENT GOALS: To get rid of the pain completely  NEXT MD VISIT: 04/21/2023  OBJECTIVE:  Note: Objective measures were completed at Evaluation unless otherwise noted.  DIAGNOSTIC FINDINGS:  04/09/2022 Thoracic Spine -- Negative   04/09/2022 Right shoulder -- Negative   PATIENT SURVEYS:  FOTO 47 current, 57 predicted   COGNITION: Overall cognitive status: Within functional limits for tasks assessed  SENSATION: Not tested  POSTURE: forward head  CERVICAL ROM:   Active ROM A/PROM (deg) eval  Flexion WFL  Extension WFL, p!  Right lateral flexion WFL  Left lateral flexion WFL , P!  Right rotation WFL  Left rotation WFL   (Blank rows = not tested)  UPPER EXTREMITY ROM:  Active ROM Right eval Left eval  Shoulder flexion [facility]   Shoulder extension    Shoulder abduction WFL, p! [facility]  Shoulder adduction    Shoulder extension    Shoulder internal rotation    Shoulder external rotation    Elbow flexion    Elbow extension    Wrist flexion    Wrist extension    Wrist ulnar deviation    Wrist radial deviation    Wrist pronation    Wrist supination     (Blank rows = not tested)  UPPER EXTREMITY MMT:  MMT Right eval Left eval  Shoulder flexion 4- 4-  Shoulder extension    Shoulder abduction 4- 4-  Shoulder adduction    Shoulder extension    Shoulder internal rotation    Shoulder external rotation    Middle trapezius 3+ 3+  Lower trapezius 3+ 3+  Elbow flexion    Elbow extension    Wrist flexion    Wrist extension    Wrist ulnar deviation    Wrist radial deviation    Wrist pronation    Wrist supination    Grip strength     (Blank rows = not tested)   VESTIBULAR ASSESSMENT performed 05/05/23:   GENERAL OBSERVATION: dizziness when performing supine to sit that last <1 minute; patient is blinking and closing her eyes in effort to mitigate dizziness    SYMPTOM BEHAVIOR:  Subjective history: Patient has experienced  vertigo symptoms every couple of years since 2017.   Non-Vestibular symptoms: neck pain and tinnitus  Type of dizziness: Spinning/Vertigo  Frequency: daily with bed mobility   Duration: < 1 min   Aggravating factors: Induced by position change: rolling to the right and supine to sit  Relieving factors: head stationary and closing eyes  Progression of symptoms: worse   POSITIONAL TESTING: Right Dix-Hallpike: dizziness, nystagmus and Duration: <20 seconds      TODAY'S TREATMENT:    [facility] Adult PT Treatment:                                                DATE: 05/11/23 Therapeutic Exercise: Nustep level 5 x 6 mins Rows RTB 2x10 Shoulder extension RTB 2x10 Triceps pressdown RTB 2x10 Cervical retraction with ball, 3 sec x 20  Seated double ER with scap retraction RTB 2x10 Seated horizontal abduction RTB 2x10 Seated cervical ext over towel 3" hold 2x10 UT stretch with contralateral table grab 2 x 30 sec each  Levator 2 x 30 sec each    [facility] Adult PT Treatment:  DATE: 05/10/2023  Therapeutic Exercise: UBE level 1 x 2 min fwd/13min back  Cervical retraction with ball, 3 sec x 20  UT 2 x 30 sec each  Levator 2 x 30 sec each Supine rotation AROM x 10 each side  Rows RTB 2x10 Shoulder extension RTB 2x10  Updated and reviewed HEP to include all exercises performed today   Manual Therapy: STM BIL suboccipital release, BIL UT, levator mm Passive stretch of BIL UT and levator mm   [facility] Adult PT Treatment:                                                DATE: 05/06/23 Therapeutic Exercise: Rows GTB 2x10 Shoulder extension GTB 2x10 - cues to loosen grip as she reports pain in hands Isometric Er/IR walk outs 2 x 5 Seated horizontal abduction RTB 2x10  Therapeutic Activity Vestibular Assessment: See results as noted above  Self-Care for patient education following RIGHT Epley Maneuver RIGHT Epley Maneuver, followed by patient education for  4 hours aftercare (precautions and symptom monitoring)                                                                         PATIENT EDUCATION:  Education details: reviewed initial home exercise program; discussion of POC, prognosis and goals for skilled PT   Person educated: Patient Education method: Explanation, Demonstration, and Handouts Education comprehension: verbalized understanding  HOME EXERCISE PROGRAM: Access Code: [email or web address] URL: [email or web address] Date: 04/01/2023 Prepared by: Mauri Reading  Exercises - Supine Cervical Rotation AROM on Pillow  - 2 x daily - 7 x weekly - 1 sets - 10 reps - Supine Cervical Retraction with Towel  - 2 x daily - 7 x weekly - 1 sets - 15-20 reps - 3 sec hold - Seated Scapular Retraction  - 2 x daily - 7 x weekly - 1 sets - 15-20 reps - 3 sec hold  ASSESSMENT:  CLINICAL IMPRESSION: Patient presents to PT reporting improved pain and dizziness today. Session today continued to focus on periscapular and upper back strengthening as well as neck ROM. Patient was able to tolerate all prescribed exercises with no adverse effects. She endorses muscular fatigue with band exercises and does report some pain in BIL hands, R>L with exercises that require gripping. Patient continues to benefit from skilled PT services and should be progressed as able to improve functional independence.    OBJECTIVE IMPAIRMENTS: decreased activity tolerance, decreased endurance, decreased mobility, decreased ROM, decreased strength, impaired UE functional use, improper body mechanics, postural dysfunction, and pain.   ACTIVITY LIMITATIONS: carrying, lifting, sleeping, reach over head, hygiene/grooming, and caring for others  PARTICIPATION LIMITATIONS: meal prep, cleaning, driving, and community activity  PERSONAL FACTORS: Age, Fitness, Past/current experiences, Time since onset of injury/illness/exacerbation, and 3+ comorbidities: Relevant PMHx includes  anemia, anxiety, B12 deficiency, DM, HTN, IBS, DOE, tachycardia, diabetic neuropathy   are also affecting patient's functional outcome.   REHAB POTENTIAL: Fair    CLINICAL DECISION MAKING: Evolving/moderate complexity  EVALUATION COMPLEXITY: Moderate   GOALS: Goals reviewed with patient? Yes  SHORT TERM GOALS: Target date:  04/22/2023   Patient will be independent with initial home program for cervical ROM and periscapular strengthening.  Baseline: provided at eval  Goal status: Ongoing  2.  Patient will demonstrate improved postural awareness for at least 15 minutes while seated without need for cueing from PT.   Baseline: see objective measures  Goal status: Ongoing   LONG TERM GOALS: Target date: 06/03/2023, last updated 05/05/23   Patient will report improved overall functional ability with FOTO score of 55 or greater Baseline: 47 Goal status: ONGOING  2.  Patient will demonstrate ability to perform CS AROM WFL in all directions with no more than 3/10 pain.  Baseline: see objective measures  Goal status: ONGOING  3.  Patient will demonstrate improved shoulder and periscapular MMT scores to at least 4+/5 in order to improved tolerance of ADLs and IADLs.  Baseline: see objective measures  Goal status: ONGOING  4.  Patient will demonstrate ability to perform overhead lifting of at least 10# using appropriate body mechanics and with no more than minimal pain in order to safely perform normal daily/occupational tasks.   Baseline: unable to tolerate d/t pain  Goal status: ONGOING  5.  Patient will demonstrate ability to perform floor to waist lifting of at least 25# using appropriate body mechanics and with no more than minimal pain in order to safely perform normal daily/occupational tasks.   Baseline: unable to tolerate d/t pain  Goal status: ONGOING  6. Patient will report absence of dizziness when performing supine to sit for at least 1 week.   Baseline: daily with  position changes  Goal status: INITIAL    PLAN:  PT FREQUENCY: 1-2x/week   PT DURATION: 4 weeks, last updated 05/05/23  PLANNED INTERVENTIONS: 97164- PT Re-evaluation, 97110-Therapeutic exercises, 97530- Therapeutic activity, 97112- Neuromuscular re-education, 97535- Self Care, 11914- Manual therapy, [phone]- Canalith repositioning, [id number]- Electrical stimulation (manual), Patient/Family education, Taping, Dry Needling, Joint mobilization, Spinal mobilization, Cryotherapy, and Moist heat  PLAN FOR NEXT SESSION: address CS ROM, periscapular strengthening, thoracic mobility, isometric-> concentric shoulder strengthening; canalith repositioning as indicated   Berta Minor PTA 05/11/2023 9:10 AM

## 2023-05-12 ENCOUNTER — Encounter (INDEPENDENT_AMBULATORY_CARE_PROVIDER_SITE_OTHER): Payer: Self-pay | Admitting: Adult Health

## 2023-05-12 ENCOUNTER — Other Ambulatory Visit: Payer: Self-pay | Admitting: Physical Medicine and Rehabilitation

## 2023-05-12 ENCOUNTER — Ambulatory Visit (INDEPENDENT_AMBULATORY_CARE_PROVIDER_SITE_OTHER): Payer: Medicaid Other | Admitting: Adult Health

## 2023-05-12 VITALS — BP 95/70 | HR 81 | Temp 98.2°F | Ht <= 58 in | Wt 127.0 lb

## 2023-05-12 DIAGNOSIS — I152 Hypertension secondary to endocrine disorders: Secondary | ICD-10-CM

## 2023-05-12 DIAGNOSIS — E669 Obesity, unspecified: Secondary | ICD-10-CM | POA: Diagnosis not present

## 2023-05-12 DIAGNOSIS — N1832 Chronic kidney disease, stage 3b: Secondary | ICD-10-CM | POA: Diagnosis not present

## 2023-05-12 DIAGNOSIS — R5383 Other fatigue: Secondary | ICD-10-CM | POA: Diagnosis not present

## 2023-05-12 DIAGNOSIS — Z6826 Body mass index (BMI) 26.0-26.9, adult: Secondary | ICD-10-CM

## 2023-05-12 DIAGNOSIS — E0859 Diabetes mellitus due to underlying condition with other circulatory complications: Secondary | ICD-10-CM | POA: Diagnosis not present

## 2023-05-12 DIAGNOSIS — R109 Unspecified abdominal pain: Secondary | ICD-10-CM

## 2023-05-12 DIAGNOSIS — E0822 Diabetes mellitus due to underlying condition with diabetic chronic kidney disease: Secondary | ICD-10-CM | POA: Diagnosis not present

## 2023-05-12 DIAGNOSIS — Z6831 Body mass index (BMI) 31.0-31.9, adult: Secondary | ICD-10-CM

## 2023-05-12 DIAGNOSIS — E1159 Type 2 diabetes mellitus with other circulatory complications: Secondary | ICD-10-CM

## 2023-05-12 DIAGNOSIS — Z7984 Long term (current) use of oral hypoglycemic drugs: Secondary | ICD-10-CM

## 2023-05-12 DIAGNOSIS — E66811 Obesity, class 1: Secondary | ICD-10-CM

## 2023-05-12 NOTE — Progress Notes (Signed)
WEIGHT SUMMARY AND BIOMETRICS  Vitals Temp: 98.2 F (36.8 C) BP: 95/70 Pulse Rate: 81 SpO2: 99 %   Anthropometric Measurements Height: 4\' 10"  (1.473 m) Weight: 127 lb (57.6 kg) BMI (Calculated): 26.55 Weight at Last Visit: 127lb Weight Lost Since Last Visit: 0 Weight Gained Since Last Visit: 0 Starting Weight: 152lb Total Weight Loss (lbs): 25 lb (11.3 kg)   Body Composition  Body Fat %: 38.4 % Fat Mass (lbs): 49 lbs Muscle Mass (lbs): 74.6 lbs Total Body Water (lbs): 54 lbs Visceral Fat Rating : 7   Other Clinical Data Fasting: yes Labs: no Today's Visit #: 42 Starting Date: 01/10/20    Chief Complaint:   OBESITY Betty Cain is here to discuss her progress with her obesity treatment plan. She is on the the Category 2 Plan and states she is following her eating plan approximately 20 % of the time.  She states she is exercising: NEAT Activities   Interim History:  She continues to experience intermittent abdominal pain. She contacted her established GI/Dr. Adela Lank She was started on Cymbalta 30mg  daily She denies current GI upset, however reports she experienced abdominal pain yesterday.  Exercise-she will occasionally dance  Hydration-water and hot teas  Of note- [place] Interpreter, Enayat Ibrahuin, at [facility] during OV  Subjective:   1. Hypertension associated with diabetes (HCC) BP soft She denies sx's of hypotension She has lost 25 lbs She is on: ivabradine (CORLANOR) 5 MG TABS tablet  aspirin EC 81 MG tablet  bisoprolol (ZEBETA) 5 MG tablet  cloNIDine (CATAPRES) 0.1 MG tablet  metFORMIN (GLUCOPHAGE-XR) 500 MG 24 hr tablet  rosuvastatin (CRESTOR) 5 MG tablet   2. Abdominal pain, unspecified abdominal location She continues to experience intermittent abdominal pain. She contacted her established GI/Dr. Adela Lank She was started on Cymbalta 30mg  daily She denies current GI upset, however reports she experienced abdominal pain  yesterday.  3. 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 Pasty Arch Recommended to suspend GLP-1 therapy over concerns of gastroparesis    She stopped Ozempic 1mg - last dose 02/24/2023   Started on Metformin XR 500 mg one daily on 03/07/2023  Assessment/Plan:   1. Hypertension associated with diabetes (HCC) (Primary) Check Labs - CMP14+EGFR  2. Abdominal pain, unspecified abdominal location Continue current regime per GI F/u with GI PRN  3. 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 Other fatigue Check Labs - VITAMIN D 25 Hydroxy (Vit-D Deficiency, Fractures) - Vitamin B12 - TSH + free T4  5. Obesity, current BMI 26.55  Betty Cain is currently in the action stage of change. As such, her goal is to maintain weight for now. She has agreed to the Category 2 Plan.   Exercise goals: All adults should avoid inactivity. Some physical activity is better than none, and adults who participate in any amount of physical activity gain some health benefits. Adults should also include muscle-strengthening activities that involve all major muscle groups on 2 or more days a week.  Behavioral modification strategies: increasing lean protein intake, decreasing simple carbohydrates, increasing  vegetables, increasing water intake, meal planning and cooking strategies, keeping healthy foods in the home, ways to avoid boredom eating, ways to avoid night time snacking, and planning for success.  Betty Cain has agreed to follow-up with our  clinic in 4 weeks. She was informed of the importance of frequent follow-up visits to maximize her success with intensive lifestyle modifications for her multiple health conditions.   Betty Cain was informed we would discuss her lab results at her next visit unless there is a critical issue that needs to be addressed sooner. Betty Cain agreed to keep her next visit at the agreed upon time to discuss these results.  Objective:   Blood pressure 95/70, pulse 81, temperature 98.2 F (36.8 C), height 4\' 10"  (1.473 m), weight 127 lb (57.6 kg), SpO2 99%. Body mass index is 26.54 kg/m.  General: Cooperative, alert, well developed, in no acute distress. HEENT: Conjunctivae and lids unremarkable. Cardiovascular: Regular rhythm.  Lungs: Normal work of breathing. Neurologic: No focal deficits.   Lab Results  Component Value Date   CREATININE 0.56 (L) 03/07/2023   BUN 17 03/07/2023   NA 142 03/07/2023   K 4.6 03/07/2023   CL 105 03/07/2023   CO2 21 03/07/2023   Lab Results  Component Value Date   ALT 15 03/07/2023   AST 18 03/07/2023   ALKPHOS 82 03/07/2023   BILITOT 0.5 03/07/2023   Lab Results  Component Value Date   HGBA1C 5.4 04/22/2023   HGBA1C 5.3 03/07/2023   HGBA1C 5.2 10/22/2022   HGBA1C 5.2 06/21/2022   HGBA1C 5.1 04/09/2022   Lab Results  Component Value Date   INSULIN 6.5 03/07/2023   INSULIN 7.9 06/21/2022   INSULIN 3.8 12/28/2021   INSULIN 8.7 01/10/2020   Lab Results  Component Value Date   TSH 2.530 12/28/2021   Lab Results  Component Value Date   CHOL 144 03/07/2023   HDL 42 03/07/2023   LDLCALC 89 03/07/2023   TRIG 66 03/07/2023   CHOLHDL 3.4 03/07/2023   Lab Results  Component Value Date   VD25OH 41.0 03/07/2023   VD25OH 51.2 06/21/2022   VD25OH 35.6 12/28/2021   Lab Results  Component Value Date   WBC 13.8 (H) 12/13/2022   HGB 13.7 12/13/2022   HCT 42.0 12/13/2022   MCV 90.9 12/13/2022   PLT 316 12/13/2022   Lab Results  Component Value Date    IRON 53 10/22/2022   TIBC 253 10/22/2022   FERRITIN 295 (H) 10/22/2022    Attestation Statements:   Reviewed by clinician on day of visit: allergies, medications, problem list, medical history, surgical history, family history, social history, and previous encounter notes.  I have reviewed the above documentation for accuracy and completeness, and I agree with the above. -  Betty Cain d. Inman Fettig, NP-C

## 2023-05-14 LAB — INSULIN, RANDOM: INSULIN: 6.2 u[IU]/mL (ref 2.6–24.9)

## 2023-05-14 LAB — CMP14+EGFR
ALT: 17 [IU]/L (ref 0–32)
AST: 22 [IU]/L (ref 0–40)
Albumin: 4.9 g/dL (ref 3.9–4.9)
Alkaline Phosphatase: 88 [IU]/L (ref 44–121)
BUN/Creatinine Ratio: 17 (ref 9–23)
BUN: 13 mg/dL (ref 6–24)
Bilirubin Total: 0.5 mg/dL (ref 0.0–1.2)
CO2: 21 mmol/L (ref 20–29)
Calcium: 9.9 mg/dL (ref 8.7–10.2)
Chloride: 102 mmol/L (ref 96–106)
Creatinine, Ser: 0.78 mg/dL (ref 0.57–1.00)
Globulin, Total: 2.9 g/dL (ref 1.5–4.5)
Glucose: 77 mg/dL (ref 70–99)
Potassium: 4.5 mmol/L (ref 3.5–5.2)
Sodium: 142 mmol/L (ref 134–144)
Total Protein: 7.8 g/dL (ref 6.0–8.5)
eGFR: 95 mL/min/{1.73_m2} (ref >59)

## 2023-05-14 LAB — HEMOGLOBIN A1C
Est. average glucose Bld gHb Est-mCnc: 105 mg/dL
Hgb A1c MFr Bld: 5.3 % (ref 4.8–5.6)

## 2023-05-14 LAB — VITAMIN B12: Vitamin B-12: 489 pg/mL (ref 232–1245)

## 2023-05-14 LAB — TSH+FREE T4
Free T4: 1.2 ng/dL (ref 0.82–1.77)
TSH: 1.71 u[IU]/mL (ref 0.450–4.500)

## 2023-05-14 LAB — VITAMIN D 25 HYDROXY (VIT D DEFICIENCY, FRACTURES): Vit D, 25-Hydroxy: 45.8 ng/mL (ref 30.0–100.0)

## 2023-05-16 ENCOUNTER — Other Ambulatory Visit: Payer: Self-pay | Admitting: Nurse Practitioner

## 2023-05-18 ENCOUNTER — Other Ambulatory Visit: Payer: Self-pay | Admitting: Gastroenterology

## 2023-05-19 ENCOUNTER — Ambulatory Visit: Payer: Medicaid Other | Attending: Physical Medicine and Rehabilitation

## 2023-05-19 DIAGNOSIS — H8111 Benign paroxysmal vertigo, right ear: Secondary | ICD-10-CM | POA: Diagnosis present

## 2023-05-19 DIAGNOSIS — R42 Dizziness and giddiness: Secondary | ICD-10-CM | POA: Insufficient documentation

## 2023-05-19 DIAGNOSIS — M5413 Radiculopathy, cervicothoracic region: Secondary | ICD-10-CM | POA: Diagnosis present

## 2023-05-19 DIAGNOSIS — M546 Pain in thoracic spine: Secondary | ICD-10-CM | POA: Insufficient documentation

## 2023-05-19 DIAGNOSIS — M6281 Muscle weakness (generalized): Secondary | ICD-10-CM | POA: Insufficient documentation

## 2023-05-19 DIAGNOSIS — M542 Cervicalgia: Secondary | ICD-10-CM | POA: Insufficient documentation

## 2023-05-19 DIAGNOSIS — R293 Abnormal posture: Secondary | ICD-10-CM | POA: Diagnosis present

## 2023-05-19 NOTE — Therapy (Addendum)
 OUTPATIENT PHYSICAL THERAPY RECERT/TREATMENT NOTE   Patient Name: Betty Cain MRN: [id number] DOB:May 23, 1975, 48 y.o., female Today's Date: 05/19/2023   END OF SESSION:  PT End of Session - 05/19/23 1009     Visit Number 8    Date for PT Re-Evaluation 06/03/23    Authorization Type [employer]    Authorization Time Period 7 visits approved 04/04/23-06/02/23    Authorization - Visit Number 7    Authorization - Number of Visits 7    PT Start Time [phone]    PT Stop Time 1000    PT Time Calculation (min) 42 min    Behavior During Therapy [facility] for tasks assessed/performed             Past Medical History:  Diagnosis Date   Allergy     Anemia    Anxiety 01/2019   Asthma    B12 deficiency    Back pain    Common migraine with intractable migraine 06/28/2017   Constipation    Diabetes (HCC) 02/2019   Fatigue    GERD (gastroesophageal reflux disease)    pos H pylori   Hypertension    controlled with medication   IBS (irritable bowel syndrome) 2005   Joint pain    Neonatal death    Vaginal delivery, full term-lived x2 hours.    Palpitations    Shortness of breath    Shortness of breath on exertion    Spinal headache    Swelling of both lower extremities    Valvular heart disease    Vitamin D  deficiency 02/2019   Past Surgical History:  Procedure Laterality Date   ADENOIDECTOMY     and tonsils (as a child)   BIOPSY  08/27/2021   Procedure: BIOPSY;  Surgeon: Leigh Elspeth SQUIBB, MD;  Location: [facility];  Service: Gastroenterology;;   boil  2003   right elbow   CESAREAN SECTION     x4   CESAREAN SECTION  06/02/2011   Procedure: CESAREAN SECTION;  Surgeon: Norleen LULLA Server, MD;  Location: [facility];  Service: Gynecology;  Laterality: N/A;  Primary Cesarean Section Delivery Baby Boy @ 0004, Apgars 9/9   CESAREAN SECTION N/A 12/10/2013   Procedure: REPEAT CESAREAN SECTION;  Surgeon: Winton Felt, MD;  Location: [facility];  Service: Obstetrics;  Laterality: N/A;    CESAREAN SECTION     colonoscopy  11/23/2018   stated had issues with sleep when in for procedure   ESOPHAGOGASTRODUODENOSCOPY (EGD) WITH PROPOFOL  N/A 08/27/2021   Procedure: ESOPHAGOGASTRODUODENOSCOPY (EGD) WITH PROPOFOL ;  Surgeon: Leigh Elspeth SQUIBB, MD;  Location: [facility];  Service: Gastroenterology;  Laterality: N/A;   OTHER SURGICAL HISTORY  2005   Uterine surgery    uterine cauterization     Patient Active Problem List   Diagnosis Date Noted   Dislocation of jaw 07/23/2022   Small vessel disease (HCC) 06/29/2022   Type 2 diabetes mellitus with obesity (HCC) 03/25/2022   Hypotension - iatrogenic 03/18/2022   Other chronic pain 02/02/2022   Dyspepsia    Early satiety    IDA (iron deficiency anemia) 01/29/2021   Paroxysmal supraventricular tachycardia (HCC) 10/01/2020   Class 1 obesity with serious comorbidity and body mass index (BMI) of 31.0 to 31.9 in adult 10/01/2020   Diabetes mellitus (HCC) 02/14/2020   Mixed diabetic hyperlipidemia associated with type 2 diabetes mellitus (HCC) 01/10/2020   Hypertension associated with diabetes (HCC) 01/10/2020   Diabetic neuropathy (HCC) 01/10/2020   Vitamin D  deficiency 01/10/2020   Diabetes  mellitus with proteinuria (HCC) 08/20/2019   History of Helicobacter pylori infection 08/08/2019   Proteinuria 06/14/2019   Elevated sed rate 05/23/2019   Elevated C-reactive protein (CRP) 05/23/2019   Diabetes mellitus without complication (HCC) 05/23/2019   Dyslipidemia (high LDL; low HDL) 05/23/2019   Type 2 diabetes mellitus with hyperglycemia, without long-term current use of insulin  (HCC) 02/17/2019   Hemoglobin A1C between 7% and 9% indicating borderline diabetic control (HCC) 02/17/2019   Breast cancer screening by mammogram 02/17/2019   Frequent headaches 02/14/2019   Anxiety 02/14/2019   Common migraine with intractable migraine 06/28/2017   Chest pain at rest 10/17/2016   Sinus tachycardia 08/18/2016   Prolonged Q-T interval  on ECG 08/18/2016   Vertigo 07/22/2015   Abdominal discomfort 02/14/2015   Low back pain 02/14/2015   Abdominal pain 02/01/2015   Nausea without vomiting 02/01/2015   Environmental allergies 02/01/2015   Language barrier to communication 02/01/2015   Anemia 01/23/2015   Constipation 01/21/2015   Knee pain, bilateral 01/21/2015   Palpitations 09/18/2013   Shortness of breath 09/18/2013   Advanced maternal age in pregnancy in second trimester 06/19/2013   IBS (irritable bowel syndrome) 04/13/2003    PCP: Oley Bascom RAMAN, NP  REFERRING PROVIDER: Lorilee Sven SQUIBB, MD  REFERRING DIAG: Cervical myofascial pain syndrome [M79.18], R42 (ICD-10-CM) - Vertigo   THERAPY DIAG:  Cervicalgia  Pain in thoracic spine  Muscle weakness (generalized)  BPPV (benign paroxysmal positional vertigo), right  Dizziness and giddiness  Rationale for Evaluation and Treatment: Rehabilitation  ONSET DATE: 1+ year   SUBJECTIVE:                                                                                                                                                                                                         SUBJECTIVE STATEMENT: In-person interpreter was present throughout today's session.   Patient reports to PT with some neck pain and increase dizziness as compared to last week. She reports feeling some numbness on the right side of her head. She has history of migraines.   Hand dominance: Right  PERTINENT HISTORY:  Relevant PMHx includes anemia, anxiety, B12 deficiency, DM, HTN, IBS, DOE, tachycardia, diabetic neuropathy   PAIN:  Are you having pain?  Yes: NPRS scale: 8/10 Pain location: BIL neck/shoulder  Pain description: pain  Aggravating factors: looking over her shoulder, looking up to the ceiling, turning head quickly  Relieving factors: trigger point injections (a little improvement), hot pack, pain killer    PRECAUTIONS: None  RED  FLAGS: None  WEIGHT BEARING RESTRICTIONS: No  FALLS:  Has patient fallen in last 6 months? No  LIVING ENVIRONMENT: Lives with: lives with their family Stairs: No  OCCUPATION: Homemaking  PLOF: Independent  PATIENT GOALS: To get rid of the pain completely  NEXT MD VISIT: 04/21/2023  OBJECTIVE:  Note: Objective measures were completed at Evaluation unless otherwise noted.  DIAGNOSTIC FINDINGS:  04/09/2022 Thoracic Spine -- Negative   04/09/2022 Right shoulder -- Negative   PATIENT SURVEYS:  FOTO 47 current, 57 predicted   COGNITION: Overall cognitive status: Within functional limits for tasks assessed  SENSATION: Not tested  POSTURE: forward head  CERVICAL ROM:   Active ROM A/PROM (deg) eval  Flexion WFL  Extension WFL, p!  Right lateral flexion WFL  Left lateral flexion WFL , P!  Right rotation WFL  Left rotation WFL   (Blank rows = not tested)  UPPER EXTREMITY ROM:  Active ROM Right eval Left eval  Shoulder flexion [facility]   Shoulder extension    Shoulder abduction WFL, p! [facility]  Shoulder adduction    Shoulder extension    Shoulder internal rotation    Shoulder external rotation    Elbow flexion    Elbow extension    Wrist flexion    Wrist extension    Wrist ulnar deviation    Wrist radial deviation    Wrist pronation    Wrist supination     (Blank rows = not tested)  UPPER EXTREMITY MMT:  MMT Right eval Left eval  Shoulder flexion 4- 4-  Shoulder extension    Shoulder abduction 4- 4-  Shoulder adduction    Shoulder extension    Shoulder internal rotation    Shoulder external rotation    Middle trapezius 3+ 3+  Lower trapezius 3+ 3+  Elbow flexion    Elbow extension    Wrist flexion    Wrist extension    Wrist ulnar deviation    Wrist radial deviation    Wrist pronation    Wrist supination    Grip strength     (Blank rows = not tested)   VESTIBULAR ASSESSMENT performed 05/05/23:   GENERAL OBSERVATION: dizziness when  performing supine to sit that last <1 minute; patient is blinking and closing her eyes in effort to mitigate dizziness    SYMPTOM BEHAVIOR:  Subjective history: Patient has experienced vertigo symptoms every couple of years since 2017.   Non-Vestibular symptoms: neck pain and tinnitus  Type of dizziness: Spinning/Vertigo  Frequency: daily with bed mobility   Duration: < 1 min   Aggravating factors: Induced by position change: rolling to the right and supine to sit  Relieving factors: head stationary and closing eyes  Progression of symptoms: worse   POSITIONAL TESTING: Right Dix-Hallpike: dizziness, nystagmus and Duration: <20 seconds      TODAY'S TREATMENT:     [facility] Adult PT Treatment:                                                DATE: 05/19/2023  Therapeutic Exercise: Nustep level 5 x 6 mins Seated double ER with scap retraction RTB 2x10 Seated horizontal abduction RTB 2x10  Manual Therapy:  STM BIL suboccipital release, BIL UT, levator mm, BIL Rhomboids Passive stretch of BIL UT and levator mm   Neuromuscular Reed and Self-Care:  R dix hallpike assessment followed by RIGHT  epley maneuver  followed by patient education for 4 hours aftercare (symptom monitoring)  Patient education regarding related to postural precautions and symptoms monitoring, change in symptoms.                                                          [facility] Adult PT Treatment:                                                DATE: 05/11/23 Therapeutic Exercise: Nustep level 5 x 6 mins Rows RTB 2x10 Shoulder extension RTB 2x10 Triceps pressdown RTB 2x10 Cervical retraction with ball, 3 sec x 20  Seated double ER with scap retraction RTB 2x10 Seated horizontal abduction RTB 2x10 Seated cervical ext over towel 3 hold 2x10 UT stretch with contralateral table grab 2 x 30 sec each  Levator 2 x 30 sec each    [facility] Adult PT Treatment:                                                DATE:  05/10/2023  Therapeutic Exercise: UBE level 1 x 2 min fwd/6min back  Cervical retraction with ball, 3 sec x 20  UT 2 x 30 sec each  Levator 2 x 30 sec each Supine rotation AROM x 10 each side  Rows RTB 2x10 Shoulder extension RTB 2x10  Updated and reviewed HEP to include all exercises performed today   Manual Therapy: STM BIL suboccipital release, BIL UT, levator mm Passive stretch of BIL UT and levator mm   [facility] Adult PT Treatment:                                                DATE: 05/06/23 Therapeutic Exercise: Rows GTB 2x10 Shoulder extension GTB 2x10 - cues to loosen grip as she reports pain in hands Isometric Er/IR walk outs 2 x 5 Seated horizontal abduction RTB 2x10  Therapeutic Activity Vestibular Assessment: See results as noted above  Self-Care for patient education following RIGHT Epley Maneuver RIGHT Epley Maneuver, followed by patient education for 4 hours aftercare (precautions and symptom monitoring)                                                                         PATIENT EDUCATION:  Education details: reviewed initial home exercise program; discussion of POC, prognosis and goals for skilled PT   Person educated: Patient Education method: Explanation, Demonstration, and Handouts Education comprehension: verbalized understanding  HOME EXERCISE PROGRAM: Access Code: [email or web address] URL: [email or web address] Date: 05/19/2023 Prepared by: Marko Molt  Exercises - Supine Cervical Rotation AROM on Pillow  - 2 x daily - 7 x  weekly - 1 sets - 10 reps - Supine Cervical Retraction with Towel  - 2 x daily - 7 x weekly - 1 sets - 15-20 reps - 3 sec hold - Seated Scapular Retraction  - 2 x daily - 7 x weekly - 1 sets - 15-20 reps - 3 sec hold - Seated Gentle Upper Trapezius Stretch  - 1 x daily - 7 x weekly - 2 reps - 20 sec hold - Gentle Levator Scapulae Stretch  - 1 x daily - 7 x weekly - 2 reps - 20 sec hold - Standing Shoulder Row with Anchored  Resistance  - 1 x daily - 7 x weekly - 2 sets - 10 reps - Shoulder extension with resistance - Neutral  - 1 x daily - 7 x weekly - 2 sets - 10 reps  ASSESSMENT:  CLINICAL IMPRESSION: Patient's symptoms were positive with right dix hallpike, with less intensity and duration as compared to 05/05/23. She does reports some nausea after epley maneuver today, that resolved with sitting for 5 minutes with focus on breathing. She verbalizes understanding of 4 hour precautions. We will continue to monitor dizziness and otherwise progress treatment towards established rehab goals. She continues to respond well to manual therapy and well as strengthening program. Plan is to continue 2x/week for 2 weeks.     OBJECTIVE IMPAIRMENTS: decreased activity tolerance, decreased endurance, decreased mobility, decreased ROM, decreased strength, impaired UE functional use, improper body mechanics, postural dysfunction, and pain.   ACTIVITY LIMITATIONS: carrying, lifting, sleeping, reach over head, hygiene/grooming, and caring for others  PARTICIPATION LIMITATIONS: meal prep, cleaning, driving, and community activity  PERSONAL FACTORS: Age, Fitness, Past/current experiences, Time since onset of injury/illness/exacerbation, and 3+ comorbidities: Relevant PMHx includes anemia, anxiety, B12 deficiency, DM, HTN, IBS, DOE, tachycardia, diabetic neuropathy   are also affecting patient's functional outcome.   REHAB POTENTIAL: Fair    CLINICAL DECISION MAKING: Evolving/moderate complexity  EVALUATION COMPLEXITY: Moderate   GOALS: Goals reviewed with patient? Yes  SHORT TERM GOALS: Target date: 04/22/2023   Patient will be independent with initial home program for cervical ROM and periscapular strengthening.  Baseline: provided at eval  Goal status: MET 05/19/23  2.  Patient will demonstrate improved postural awareness for at least 15 minutes while seated without need for cueing from PT.   Baseline: see objective  measures  Goal status: MET 05/19/23   LONG TERM GOALS: Target date: 06/03/2023, last updated 05/05/23   Patient will report improved overall functional ability with FOTO score of 55 or greater Baseline: 47 Goal status: ONGOING  2.  Patient will demonstrate ability to perform CS AROM WFL in all directions with no more than 3/10 pain.  Baseline: see objective measures  Goal status: ONGOING  3.  Patient will demonstrate improved shoulder and periscapular MMT scores to at least 4+/5 in order to improved tolerance of ADLs and IADLs.  Baseline: see objective measures  Goal status: ONGOING  4.  Patient will demonstrate ability to perform overhead lifting of at least 10# using appropriate body mechanics and with no more than minimal pain in order to safely perform normal daily/occupational tasks.   Baseline: unable to tolerate d/t pain  Goal status: ONGOING  5.  Patient will demonstrate ability to perform floor to waist lifting of at least 25# using appropriate body mechanics and with no more than minimal pain in order to safely perform normal daily/occupational tasks.   Baseline: unable to tolerate d/t pain  Goal status: ONGOING  6. Patient will report absence of dizziness when performing supine to sit for at least 1 week.   Baseline: daily with position changes  Goal status: ONGOING   PLAN:  PT FREQUENCY: 1-2x/week   PT DURATION: 2 weeks, last updated 05/19/23  PLANNED INTERVENTIONS: [id number]- PT Re-evaluation, 97110-Therapeutic exercises, 97530- Therapeutic activity, 97112- Neuromuscular re-education, 97535- Self Care, [id number]- Manual therapy, [phone]- Canalith repositioning, [id number]- Electrical stimulation (manual), Patient/Family education, Taping, Dry Needling, Joint mobilization, Spinal mobilization, Cryotherapy, and Moist heat  PLAN FOR NEXT SESSION: address CS ROM, periscapular strengthening, thoracic mobility, isometric-> concentric shoulder strengthening; canalith repositioning as  indicated   Marko Molt, PT, DPT  05/19/2023 2:25 PM

## 2023-05-24 NOTE — Therapy (Signed)
OUTPATIENT PHYSICAL THERAPY TREATMENT NOTE   Patient Name: Betty Cain MRN: [id number] DOB:02/20/1976, 48 y.o., female Today's Date: 05/25/2023   END OF SESSION:  PT End of Session - 05/25/23 0824     Visit Number 9    Date for PT Re-Evaluation 06/03/23    Authorization Type [employer]    Authorization Time Period 4 visits 05/19/23-06/17/23    Authorization - Visit Number 2    Authorization - Number of Visits 4    PT Start Time 0830    PT Stop Time 0910    PT Time Calculation (min) 40 min    Activity Tolerance Patient tolerated treatment well    Behavior During Therapy [facility] for tasks assessed/performed              Past Medical History:  Diagnosis Date   Allergy    Anemia    Anxiety 01/2019   Asthma    B12 deficiency    Back pain    Common migraine with intractable migraine 06/28/2017   Constipation    Diabetes (HCC) 02/2019   Fatigue    GERD (gastroesophageal reflux disease)    pos H pylori   Hypertension    controlled with medication   IBS (irritable bowel syndrome) 2005   Joint pain    Neonatal death    Vaginal delivery, full term-lived x2 hours.    Palpitations    Shortness of breath    Shortness of breath on exertion    Spinal headache    Swelling of both lower extremities    Valvular heart disease    Vitamin D deficiency 02/2019   Past Surgical History:  Procedure Laterality Date   ADENOIDECTOMY     and tonsils (as a child)   BIOPSY  08/27/2021   Procedure: BIOPSY;  Surgeon: Benancio Deeds, MD;  Location: [facility];  Service: Gastroenterology;;   boil  2003   right elbow   CESAREAN SECTION     x4   CESAREAN SECTION  06/02/2011   Procedure: CESAREAN SECTION;  Surgeon: Tilda Burrow, MD;  Location: [facility];  Service: Gynecology;  Laterality: N/A;  Primary Cesarean Section Delivery Baby Boy @ 0004, Apgars 9/9   CESAREAN SECTION N/A 12/10/2013   Procedure: REPEAT CESAREAN SECTION;  Surgeon: Catalina Antigua, MD;  Location: [facility];   Service: Obstetrics;  Laterality: N/A;   CESAREAN SECTION     colonoscopy  11/23/2018   stated had issues with sleep when in for procedure   ESOPHAGOGASTRODUODENOSCOPY (EGD) WITH PROPOFOL N/A 08/27/2021   Procedure: ESOPHAGOGASTRODUODENOSCOPY (EGD) WITH PROPOFOL;  Surgeon: Benancio Deeds, MD;  Location: [facility];  Service: Gastroenterology;  Laterality: N/A;   OTHER SURGICAL HISTORY  2005   Uterine surgery    uterine cauterization     Patient Active Problem List   Diagnosis Date Noted   Dislocation of jaw 07/23/2022   Small vessel disease (HCC) 06/29/2022   Type 2 diabetes mellitus with obesity (HCC) 03/25/2022   Hypotension - iatrogenic 03/18/2022   Other chronic pain 02/02/2022   Dyspepsia    Early satiety    IDA (iron deficiency anemia) 01/29/2021   Paroxysmal supraventricular tachycardia (HCC) 10/01/2020   Class 1 obesity with serious comorbidity and body mass index (BMI) of 31.0 to 31.9 in adult 10/01/2020   Diabetes mellitus (HCC) 02/14/2020   Mixed diabetic hyperlipidemia associated with type 2 diabetes mellitus (HCC) 01/10/2020   Hypertension associated with diabetes (HCC) 01/10/2020   Diabetic neuropathy (HCC) 01/10/2020  Vitamin D deficiency 01/10/2020   Diabetes mellitus with proteinuria (HCC) 08/20/2019   History of Helicobacter pylori infection 08/08/2019   Proteinuria 06/14/2019   Elevated sed rate 05/23/2019   Elevated C-reactive protein (CRP) 05/23/2019   Diabetes mellitus without complication (HCC) 05/23/2019   Dyslipidemia (high LDL; low HDL) 05/23/2019   Type 2 diabetes mellitus with hyperglycemia, without long-term current use of insulin (HCC) 02/17/2019   Hemoglobin A1C between 7% and 9% indicating borderline diabetic control (HCC) 02/17/2019   Breast cancer screening by mammogram 02/17/2019   Frequent headaches 02/14/2019   Anxiety 02/14/2019   Common migraine with intractable migraine 06/28/2017   Chest pain at rest 10/17/2016   Sinus  tachycardia 08/18/2016   Prolonged Q-T interval on ECG 08/18/2016   Vertigo 07/22/2015   Abdominal discomfort 02/14/2015   Low back pain 02/14/2015   Abdominal pain 02/01/2015   Nausea without vomiting 02/01/2015   Environmental allergies 02/01/2015   Language barrier to communication 02/01/2015   Anemia 01/23/2015   Constipation 01/21/2015   Knee pain, bilateral 01/21/2015   Palpitations 09/18/2013   Shortness of breath 09/18/2013   Advanced maternal age in pregnancy in second trimester 06/19/2013   IBS (irritable bowel syndrome) 04/13/2003    PCP: Ivonne Andrew, NP  REFERRING PROVIDER: Horton Chin, MD  REFERRING DIAG: 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   Patient reports that she continues to have numbness on both sides of her head, but that her dizziness is better today. She states her pain is "bad" today.  Hand dominance: Right  PERTINENT HISTORY:  Relevant PMHx includes anemia, anxiety, B12 deficiency, DM, HTN, IBS, DOE, tachycardia, diabetic neuropathy   PAIN:  Are you having pain?  Yes: NPRS scale: 8/10 Pain location: BIL neck/shoulder  Pain description: "pain"  Aggravating factors: looking over her shoulder, looking up to the ceiling, turning head quickly  Relieving factors: trigger point injections (a little improvement), hot pack, pain killer    PRECAUTIONS: None  RED FLAGS: None     WEIGHT BEARING RESTRICTIONS: No  FALLS:  Has patient fallen in  last 6 months? No  LIVING ENVIRONMENT: Lives with: lives with their family Stairs: No  OCCUPATION: Homemaking  PLOF: Independent  PATIENT GOALS: To get rid of the pain completely  NEXT MD VISIT: 04/21/2023  OBJECTIVE:  Note: Objective measures were completed at Evaluation unless otherwise noted.  DIAGNOSTIC FINDINGS:  04/09/2022 Thoracic Spine -- Negative   04/09/2022 Right shoulder -- Negative   PATIENT SURVEYS:  FOTO 47 current, 57 predicted   COGNITION: Overall cognitive status: Within functional limits for tasks assessed  SENSATION: Not tested  POSTURE: forward head  CERVICAL ROM:   Active ROM A/PROM (deg) eval  Flexion WFL  Extension WFL, p!  Right lateral flexion WFL  Left lateral flexion WFL , P!  Right rotation WFL  Left rotation WFL   (Blank rows = not tested)  UPPER EXTREMITY ROM:  Active ROM Right eval Left eval  Shoulder flexion [facility]   Shoulder extension    Shoulder abduction WFL, p! [facility]  Shoulder adduction    Shoulder extension    Shoulder internal rotation    Shoulder external rotation    Elbow flexion    Elbow extension    Wrist flexion    Wrist extension    Wrist ulnar deviation    Wrist radial deviation    Wrist pronation    Wrist supination     (Blank rows = not tested)  UPPER EXTREMITY MMT:  MMT Right eval Left eval  Shoulder flexion 4- 4-  Shoulder extension    Shoulder abduction 4- 4-  Shoulder adduction    Shoulder extension    Shoulder internal rotation    Shoulder external rotation    Middle trapezius 3+ 3+  Lower trapezius 3+ 3+  Elbow flexion    Elbow extension    Wrist flexion    Wrist extension    Wrist ulnar deviation    Wrist radial deviation    Wrist pronation    Wrist supination    Grip strength     (Blank rows = not tested)   VESTIBULAR ASSESSMENT performed 05/05/23:   GENERAL OBSERVATION: dizziness when performing supine to sit that last <1 minute; patient is blinking and closing her  eyes in effort to mitigate dizziness    SYMPTOM BEHAVIOR:  Subjective history: Patient has experienced vertigo symptoms every couple of years since 2017.   Non-Vestibular symptoms: neck pain and tinnitus  Type of dizziness: Spinning/Vertigo  Frequency: daily with bed mobility   Duration: < 1 min   Aggravating factors: Induced by position change: rolling to the right and supine to sit  Relieving factors: head stationary and closing eyes  Progression of symptoms: worse   POSITIONAL TESTING: Right Dix-Hallpike: dizziness, nystagmus and Duration: <20 seconds      TODAY'S TREATMENT:    [facility] Adult PT Treatment:                                                DATE: 05/24/23 Therapeutic Exercise: Nustep level 5 x 6 mins Lifting 5# weight into cabinet bottom middle shelf BIL UE 2x10 Rows GTB 2x10 Shoulder extension RTB 2x10 Triceps pressdown RTB 2x10 Seated UT stretch 2x30" ea BIL  Seated double ER with scap retraction RTB 2x10 Chin tucks into pillow 2x10 Manual Therapy: STM BIL suboccipital release, BIL UT, levator mm Passive stretch of BIL UT and levator mm  Cervical  distraction/compression - describes less pain with compression Self Care: Theracane self instruction   [facility] Adult PT Treatment:                                                DATE: 05/19/2023  Therapeutic Exercise: Nustep level 5 x 6 mins Seated double ER with scap retraction RTB 2x10 Seated horizontal abduction RTB 2x10  Manual Therapy:  STM BIL suboccipital release, BIL UT, levator mm, BIL Rhomboids Passive stretch of BIL UT and levator mm   Neuromuscular Reed and Self-Care:  R dix hallpike assessment followed by RIGHT epley maneuver  followed by patient education for 4 hours aftercare (symptom monitoring)  Patient education regarding related to postural precautions and symptoms monitoring, change in symptoms.                                                          [facility] Adult PT Treatment:                                                 DATE: 05/11/23 Therapeutic Exercise: Nustep level 5 x 6 mins Rows RTB 2x10 Shoulder extension RTB 2x10 Triceps pressdown RTB 2x10 Cervical retraction with ball, 3 sec x 20  Seated double ER with scap retraction RTB 2x10 Seated horizontal abduction RTB 2x10 Seated cervical ext over towel 3" hold 2x10 UT stretch with contralateral table grab 2 x 30 sec each  Levator 2 x 30 sec each    PATIENT EDUCATION:  Education details: reviewed initial home exercise program; discussion of POC, prognosis and goals for skilled PT   Person educated: Patient Education method: Explanation, Demonstration, and Handouts Education comprehension: verbalized understanding  HOME EXERCISE PROGRAM: Access Code: [email or web address] URL: [email or web address] Date: 05/19/2023 Prepared by: Mauri Reading  Exercises - Supine Cervical Rotation AROM on Pillow  - 2 x daily - 7 x weekly - 1 sets - 10 reps - Supine Cervical Retraction with Towel  - 2 x daily - 7 x weekly - 1 sets - 15-20 reps - 3 sec hold - Seated Scapular Retraction  - 2 x daily - 7 x weekly - 1 sets - 15-20 reps - 3 sec hold - Seated Gentle Upper Trapezius Stretch  - 1 x daily - 7 x weekly - 2 reps - 20 sec hold - Gentle Levator Scapulae Stretch  - 1 x daily - 7 x weekly - 2 reps - 20 sec hold - Standing Shoulder Row with Anchored Resistance  - 1 x daily - 7 x weekly - 2 sets - 10 reps - Shoulder extension with resistance - Neutral  - 1 x daily - 7 x weekly - 2 sets - 10 reps  ASSESSMENT:  CLINICAL IMPRESSION: Patient presents to PT reporting continued neck, shoulder, and upper back pain and continues to describe numbness on BIL sides of her head, though she does say the dizziness is better today than previous session. Session today focused on periscapular and shoulder strengthening with use of manual techniques to decrease tension and pain. She  had more pain with SO release and cervical traction today, but decreased  symptoms with cervical compression. Patient continues to benefit from skilled PT services and should be progressed as able to improve functional independence.    OBJECTIVE IMPAIRMENTS: decreased activity tolerance, decreased endurance, decreased mobility, decreased ROM, decreased strength, impaired UE functional use, improper body mechanics, postural dysfunction, and pain.   ACTIVITY LIMITATIONS: carrying, lifting, sleeping, reach over head, hygiene/grooming, and caring for others  PARTICIPATION LIMITATIONS: meal prep, cleaning, driving, and community activity  PERSONAL FACTORS: Age, Fitness, Past/current experiences, Time since onset of injury/illness/exacerbation, and 3+ comorbidities: Relevant PMHx includes anemia, anxiety, B12 deficiency, DM, HTN, IBS, DOE, tachycardia, diabetic neuropathy   are also affecting patient's functional outcome.   REHAB POTENTIAL: Fair    CLINICAL DECISION MAKING: Evolving/moderate complexity  EVALUATION COMPLEXITY: Moderate   GOALS: Goals reviewed with patient? Yes  SHORT TERM GOALS: Target date: 04/22/2023   Patient will be independent with initial home program for cervical ROM and periscapular strengthening.  Baseline: provided at eval  Goal status: MET 05/19/23  2.  Patient will demonstrate improved postural awareness for at least 15 minutes while seated without need for cueing from PT.   Baseline: see objective measures  Goal status: MET 05/19/23   LONG TERM GOALS: Target date: 06/03/2023, last updated 05/05/23   Patient will report improved overall functional ability with FOTO score of 55 or greater Baseline: 47 Goal status: ONGOING  2.  Patient will demonstrate ability to perform CS AROM WFL in all directions with no more than 3/10 pain.  Baseline: see objective measures  Goal status: ONGOING  3.  Patient will demonstrate improved shoulder and periscapular MMT scores to at least 4+/5 in order to improved tolerance of ADLs and IADLs.   Baseline: see objective measures  Goal status: ONGOING  4.  Patient will demonstrate ability to perform overhead lifting of at least 10# using appropriate body mechanics and with no more than minimal pain in order to safely perform normal daily/occupational tasks.   Baseline: unable to tolerate d/t pain  Goal status: ONGOING  5.  Patient will demonstrate ability to perform floor to waist lifting of at least 25# using appropriate body mechanics and with no more than minimal pain in order to safely perform normal daily/occupational tasks.   Baseline: unable to tolerate d/t pain  Goal status: ONGOING  6. Patient will report absence of dizziness when performing supine to sit for at least 1 week.   Baseline: daily with position changes  Goal status: ONGOING   PLAN:  PT FREQUENCY: 1-2x/week   PT DURATION: 2 weeks, last updated 05/19/23  PLANNED INTERVENTIONS: [id number]- PT Re-evaluation, 97110-Therapeutic exercises, 97530- Therapeutic activity, 97112- Neuromuscular re-education, 97535- Self Care, [id number]- Manual therapy, [phone]- Canalith repositioning, [id number]- Electrical stimulation (manual), Patient/Family education, Taping, Dry Needling, Joint mobilization, Spinal mobilization, Cryotherapy, and Moist heat  PLAN FOR NEXT SESSION: address CS ROM, periscapular strengthening, thoracic mobility, isometric-> concentric shoulder strengthening; canalith repositioning as indicated   Berta Minor PTA 05/25/2023 9:12 AM

## 2023-05-25 ENCOUNTER — Ambulatory Visit: Payer: Medicaid Other

## 2023-05-25 DIAGNOSIS — H8111 Benign paroxysmal vertigo, right ear: Secondary | ICD-10-CM | POA: Diagnosis not present

## 2023-05-25 DIAGNOSIS — R293 Abnormal posture: Secondary | ICD-10-CM | POA: Diagnosis not present

## 2023-05-25 DIAGNOSIS — M542 Cervicalgia: Secondary | ICD-10-CM

## 2023-05-25 DIAGNOSIS — M6281 Muscle weakness (generalized): Secondary | ICD-10-CM

## 2023-05-25 DIAGNOSIS — M546 Pain in thoracic spine: Secondary | ICD-10-CM

## 2023-05-25 DIAGNOSIS — R42 Dizziness and giddiness: Secondary | ICD-10-CM | POA: Diagnosis not present

## 2023-05-25 DIAGNOSIS — M5413 Radiculopathy, cervicothoracic region: Secondary | ICD-10-CM | POA: Diagnosis not present

## 2023-05-26 ENCOUNTER — Ambulatory Visit: Payer: Medicaid Other

## 2023-05-26 DIAGNOSIS — H8111 Benign paroxysmal vertigo, right ear: Secondary | ICD-10-CM | POA: Diagnosis not present

## 2023-05-26 DIAGNOSIS — R42 Dizziness and giddiness: Secondary | ICD-10-CM | POA: Diagnosis not present

## 2023-05-26 DIAGNOSIS — M542 Cervicalgia: Secondary | ICD-10-CM

## 2023-05-26 DIAGNOSIS — M546 Pain in thoracic spine: Secondary | ICD-10-CM

## 2023-05-26 DIAGNOSIS — M5413 Radiculopathy, cervicothoracic region: Secondary | ICD-10-CM | POA: Diagnosis not present

## 2023-05-26 DIAGNOSIS — M6281 Muscle weakness (generalized): Secondary | ICD-10-CM | POA: Diagnosis not present

## 2023-05-26 DIAGNOSIS — R293 Abnormal posture: Secondary | ICD-10-CM | POA: Diagnosis not present

## 2023-05-26 NOTE — Therapy (Signed)
OUTPATIENT PHYSICAL THERAPY TREATMENT NOTE   Patient Name: Betty Cain MRN: [id number] DOB:1975-06-20, 48 y.o., female Today's Date: 05/26/2023   END OF SESSION:  PT End of Session - 05/26/23 0835     Visit Number 10    Date for PT Re-Evaluation 06/03/23    Authorization Type [employer]    Authorization Time Period 4 visits 05/19/23-06/17/23    Authorization - Visit Number 3    Authorization - Number of Visits 4    PT Start Time 0830    PT Stop Time 0910    PT Time Calculation (min) 40 min    Activity Tolerance Patient tolerated treatment well    Behavior During Therapy [facility] for tasks assessed/performed               Past Medical History:  Diagnosis Date   Allergy    Anemia    Anxiety 01/2019   Asthma    B12 deficiency    Back pain    Common migraine with intractable migraine 06/28/2017   Constipation    Diabetes (HCC) 02/2019   Fatigue    GERD (gastroesophageal reflux disease)    pos H pylori   Hypertension    controlled with medication   IBS (irritable bowel syndrome) 2005   Joint pain    Neonatal death    Vaginal delivery, full term-lived x2 hours.    Palpitations    Shortness of breath    Shortness of breath on exertion    Spinal headache    Swelling of both lower extremities    Valvular heart disease    Vitamin D deficiency 02/2019   Past Surgical History:  Procedure Laterality Date   ADENOIDECTOMY     and tonsils (as a child)   BIOPSY  08/27/2021   Procedure: BIOPSY;  Surgeon: Benancio Deeds, MD;  Location: [facility];  Service: Gastroenterology;;   boil  2003   right elbow   CESAREAN SECTION     x4   CESAREAN SECTION  06/02/2011   Procedure: CESAREAN SECTION;  Surgeon: Tilda Burrow, MD;  Location: [facility];  Service: Gynecology;  Laterality: N/A;  Primary Cesarean Section Delivery Baby Boy @ 0004, Apgars 9/9   CESAREAN SECTION N/A 12/10/2013   Procedure: REPEAT CESAREAN SECTION;  Surgeon: Catalina Antigua, MD;  Location: [facility];   Service: Obstetrics;  Laterality: N/A;   CESAREAN SECTION     colonoscopy  11/23/2018   stated had issues with sleep when in for procedure   ESOPHAGOGASTRODUODENOSCOPY (EGD) WITH PROPOFOL N/A 08/27/2021   Procedure: ESOPHAGOGASTRODUODENOSCOPY (EGD) WITH PROPOFOL;  Surgeon: Benancio Deeds, MD;  Location: [facility];  Service: Gastroenterology;  Laterality: N/A;   OTHER SURGICAL HISTORY  2005   Uterine surgery    uterine cauterization     Patient Active Problem List   Diagnosis Date Noted   Dislocation of jaw 07/23/2022   Small vessel disease (HCC) 06/29/2022   Type 2 diabetes mellitus with obesity (HCC) 03/25/2022   Hypotension - iatrogenic 03/18/2022   Other chronic pain 02/02/2022   Dyspepsia    Early satiety    IDA (iron deficiency anemia) 01/29/2021   Paroxysmal supraventricular tachycardia (HCC) 10/01/2020   Class 1 obesity with serious comorbidity and body mass index (BMI) of 31.0 to 31.9 in adult 10/01/2020   Diabetes mellitus (HCC) 02/14/2020   Mixed diabetic hyperlipidemia associated with type 2 diabetes mellitus (HCC) 01/10/2020   Hypertension associated with diabetes (HCC) 01/10/2020   Diabetic neuropathy (HCC)  01/10/2020   Vitamin D deficiency 01/10/2020   Diabetes mellitus with proteinuria (HCC) 08/20/2019   History of Helicobacter pylori infection 08/08/2019   Proteinuria 06/14/2019   Elevated sed rate 05/23/2019   Elevated C-reactive protein (CRP) 05/23/2019   Diabetes mellitus without complication (HCC) 05/23/2019   Dyslipidemia (high LDL; low HDL) 05/23/2019   Type 2 diabetes mellitus with hyperglycemia, without long-term current use of insulin (HCC) 02/17/2019   Hemoglobin A1C between 7% and 9% indicating borderline diabetic control (HCC) 02/17/2019   Breast cancer screening by mammogram 02/17/2019   Frequent headaches 02/14/2019   Anxiety 02/14/2019   Common migraine with intractable migraine 06/28/2017   Chest pain at rest 10/17/2016   Sinus  tachycardia 08/18/2016   Prolonged Q-T interval on ECG 08/18/2016   Vertigo 07/22/2015   Abdominal discomfort 02/14/2015   Low back pain 02/14/2015   Abdominal pain 02/01/2015   Nausea without vomiting 02/01/2015   Environmental allergies 02/01/2015   Language barrier to communication 02/01/2015   Anemia 01/23/2015   Constipation 01/21/2015   Knee pain, bilateral 01/21/2015   Palpitations 09/18/2013   Shortness of breath 09/18/2013   Advanced maternal age in pregnancy in second trimester 06/19/2013   IBS (irritable bowel syndrome) 04/13/2003    PCP: Ivonne Andrew, NP  REFERRING PROVIDER: Horton Chin, MD  REFERRING DIAG: 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   Patient reports that she is no longer having numbness in her head. However, she does have a headache/migraine on L>R side. She has dizziness when putting something on an overhead shelf at home. She believes that she has fibromyalgia d/t pain in multiple areas.   Hand dominance: Right  PERTINENT HISTORY:  Relevant PMHx includes anemia, anxiety, B12 deficiency, DM, HTN, IBS, DOE, tachycardia, diabetic neuropathy   PAIN:  Are you having pain?  Yes: NPRS scale: 8/10 Pain location: BIL neck/shoulder  Pain description: "pain"  Aggravating factors: looking over her shoulder, looking up to the ceiling, turning head quickly  Relieving factors: trigger point injections (a little improvement), hot pack, pain killer  PRECAUTIONS: None  RED FLAGS: None     WEIGHT BEARING RESTRICTIONS: No  FALLS:  Has patient fallen in last 6 months? No  LIVING ENVIRONMENT: Lives with: lives with their family Stairs: No  OCCUPATION: Homemaking  PLOF: Independent  PATIENT GOALS: To get rid of the pain completely  NEXT MD VISIT: 04/21/2023  OBJECTIVE:  Note: Objective measures were completed at Evaluation unless otherwise noted.  DIAGNOSTIC FINDINGS:  04/09/2022 Thoracic Spine -- Negative   04/09/2022 Right shoulder -- Negative   PATIENT SURVEYS:  FOTO 47 current, 57 predicted   COGNITION: Overall cognitive status: Within functional limits for tasks assessed  SENSATION: Not tested  POSTURE: forward head  CERVICAL ROM:   Active ROM A/PROM (deg) eval  Flexion WFL  Extension WFL, p!  Right lateral flexion WFL  Left lateral flexion WFL , P!  Right rotation WFL  Left rotation WFL   (Blank rows = not tested)  UPPER EXTREMITY ROM:  Active ROM Right eval Left eval  Shoulder flexion [facility]   Shoulder extension    Shoulder abduction WFL, p! [facility]  Shoulder adduction    Shoulder extension    Shoulder internal rotation    Shoulder external rotation    Elbow flexion    Elbow extension    Wrist flexion    Wrist extension    Wrist ulnar deviation    Wrist radial deviation    Wrist pronation    Wrist supination     (Blank rows = not tested)  UPPER EXTREMITY MMT:  MMT Right eval Left eval  Shoulder flexion 4- 4-  Shoulder extension    Shoulder abduction 4- 4-  Shoulder adduction    Shoulder extension    Shoulder internal rotation    Shoulder external rotation    Middle trapezius 3+ 3+  Lower trapezius 3+ 3+  Elbow flexion    Elbow extension    Wrist flexion    Wrist extension    Wrist ulnar deviation    Wrist radial deviation    Wrist pronation    Wrist supination    Grip strength     (Blank rows = not tested)   VESTIBULAR ASSESSMENT performed 05/05/23:    GENERAL OBSERVATION: dizziness when performing supine to sit that last <1 minute; patient is blinking and closing her eyes in effort to mitigate dizziness    SYMPTOM BEHAVIOR:  Subjective history: Patient has experienced vertigo symptoms every couple of years since 2017.   Non-Vestibular symptoms: neck pain and tinnitus  Type of dizziness: Spinning/Vertigo  Frequency: daily with bed mobility   Duration: < 1 min   Aggravating factors: Induced by position change: rolling to the right and supine to sit  Relieving factors: head stationary and closing eyes  Progression of symptoms: worse   POSITIONAL TESTING: Right Dix-Hallpike: dizziness, nystagmus and Duration: <20 seconds      TODAY'S TREATMENT:    [facility] Adult PT Treatment:                                                DATE: 05/25/23  Therapeutic Exercise: UBE, 2 min fwd/80min backward Rows GTB 2x10 Shoulder extension GTB 2x10 Triceps pressdown GTB 2x10 Seated double ER with scap retraction RTB 2x10 Chin tucks into pillow 2x10 Isometric CS side bending, 5 sec x 15 against manual resistance from clinician   Manual Therapy:  STM BIL suboccipital release, BIL UT, levator mm Passive stretch of BIL UT and levator mm  Cervical distraction/compression - describes less pain with compression  Neuro reed:  Patient education regarding nervous system involvement in current pain patterns and diagnoses    [facility] Adult PT Treatment:                                                DATE: 05/24/23 Therapeutic Exercise: Nustep level 5 x 6 mins Lifting 5# weight into cabinet bottom middle shelf BIL UE 2x10 Rows GTB 2x10 Shoulder extension RTB 2x10 Triceps pressdown RTB 2x10 Seated UT stretch 2x30" ea BIL  Seated double ER with scap retraction RTB 2x10 Chin tucks into pillow 2x10 Manual Therapy: STM BIL suboccipital release, BIL UT, levator mm Passive stretch of BIL UT and levator mm  Cervical distraction/compression - describes less pain  with compression Self Care: Theracane self instruction   [facility] Adult PT Treatment:                                                DATE: 05/19/2023  Therapeutic Exercise: Nustep level 5 x 6 mins Seated double ER with scap retraction RTB 2x10 Seated horizontal abduction RTB 2x10  Manual Therapy:  STM BIL suboccipital release, BIL UT, levator mm, BIL Rhomboids Passive stretch of BIL UT and levator mm   Neuromuscular Reed and Self-Care:  R dix hallpike assessment followed by RIGHT epley maneuver  followed by patient education for 4 hours aftercare (symptom monitoring)  Patient education regarding related to postural precautions and symptoms monitoring, change in symptoms.                                                          [facility] Adult PT Treatment:                                                DATE: 05/11/23 Therapeutic Exercise: Nustep level 5 x 6 mins Rows RTB 2x10 Shoulder extension RTB 2x10 Triceps pressdown RTB 2x10 Cervical retraction with ball, 3 sec x 20  Seated double ER with scap retraction RTB 2x10 Seated horizontal abduction RTB 2x10 Seated cervical ext over towel 3" hold 2x10 UT stretch with contralateral table grab 2 x 30 sec each  Levator 2 x 30 sec each    PATIENT EDUCATION:  Education details: reviewed initial home exercise program; discussion of POC, prognosis and goals for skilled PT   Person educated: Patient Education method: Explanation, Demonstration, and Handouts Education comprehension: verbalized understanding  HOME EXERCISE PROGRAM: Access Code: [email or web address] URL: [email or web address] Date: 05/19/2023 Prepared by: Mauri Reading  Exercises - Supine Cervical Rotation AROM on Pillow  - 2 x daily - 7 x weekly - 1 sets - 10 reps - Supine Cervical Retraction with Towel  - 2 x daily - 7 x weekly - 1 sets - 15-20 reps - 3 sec hold - Seated  Scapular Retraction  - 2 x daily - 7 x weekly - 1 sets - 15-20 reps - 3 sec hold - Seated Gentle  Upper Trapezius Stretch  - 1 x daily - 7 x weekly - 2 reps - 20 sec hold - Gentle Levator Scapulae Stretch  - 1 x daily - 7 x weekly - 2 reps - 20 sec hold - Standing Shoulder Row with Anchored Resistance  - 1 x daily - 7 x weekly - 2 sets - 10 reps - Shoulder extension with resistance - Neutral  - 1 x daily - 7 x weekly - 2 sets - 10 reps  ASSESSMENT:  CLINICAL IMPRESSION:  Patient had improved tolerance of session with increased strengthening and stabilization activities. She continue to report pain with resistance band exercises that is consistent with expected exercise-induced "muscle burn." She would like help understanding how to use her massage device and will bring it to next appt. She understands plan for discharge from [facility] at next visit with updated HEP and recommendation to follow up with referring provider.      OBJECTIVE IMPAIRMENTS: decreased activity tolerance, decreased endurance, decreased mobility, decreased ROM, decreased strength, impaired UE functional use, improper body mechanics, postural dysfunction, and pain.   ACTIVITY LIMITATIONS: carrying, lifting, sleeping, reach over head, hygiene/grooming, and caring for others  PARTICIPATION LIMITATIONS: meal prep, cleaning, driving, and community activity  PERSONAL FACTORS: Age, Fitness, Past/current experiences, Time since onset of injury/illness/exacerbation, and 3+ comorbidities: Relevant PMHx includes anemia, anxiety, B12 deficiency, DM, HTN, IBS, DOE, tachycardia, diabetic neuropathy   are also affecting patient's functional outcome.   REHAB POTENTIAL: Fair    CLINICAL DECISION MAKING: Evolving/moderate complexity  EVALUATION COMPLEXITY: Moderate   GOALS: Goals reviewed with patient? Yes  SHORT TERM GOALS: Target date: 04/22/2023   Patient will be independent with initial home program for cervical ROM and periscapular strengthening.  Baseline: provided at eval  Goal status: MET 05/19/23  2.  Patient  will demonstrate improved postural awareness for at least 15 minutes while seated without need for cueing from PT.   Baseline: see objective measures  Goal status: MET 05/19/23   LONG TERM GOALS: Target date: 06/03/2023, last updated 05/05/23   Patient will report improved overall functional ability with FOTO score of 55 or greater Baseline: 47 Goal status: ONGOING  2.  Patient will demonstrate ability to perform CS AROM WFL in all directions with no more than 3/10 pain.  Baseline: see objective measures  Goal status: ONGOING  3.  Patient will demonstrate improved shoulder and periscapular MMT scores to at least 4+/5 in order to improved tolerance of ADLs and IADLs.  Baseline: see objective measures  Goal status: ONGOING  4.  Patient will demonstrate ability to perform overhead lifting of at least 10# using appropriate body mechanics and with no more than minimal pain in order to safely perform normal daily/occupational tasks.   Baseline: unable to tolerate d/t pain  Goal status: ONGOING  5.  Patient will demonstrate ability to perform floor to waist lifting of at least 25# using appropriate body mechanics and with no more than minimal pain in order to safely perform normal daily/occupational tasks.   Baseline: unable to tolerate d/t pain  Goal status: ONGOING  6. Patient will report absence of dizziness when performing supine to sit for at least 1 week.   Baseline: daily with position changes  Goal status: ONGOING   PLAN:  PT FREQUENCY: 1-2x/week   PT DURATION: 2 weeks,  last updated 05/19/23  PLANNED INTERVENTIONS: [id number]- PT Re-evaluation, 97110-Therapeutic exercises, 97530- Therapeutic activity, 97112- Neuromuscular re-education, 97535- Self Care, 16109- Manual therapy, [phone]- Canalith repositioning, [id number]- Electrical stimulation (manual), Patient/Family education, Taping, Dry Needling, Joint mobilization, Spinal mobilization, Cryotherapy, and Moist heat  PLAN FOR NEXT SESSION:  address CS ROM, periscapular strengthening, thoracic mobility, isometric-> concentric shoulder strengthening; canalith repositioning as indicated   Mauri Reading, PT, DPT  05/26/2023 9:58 AM

## 2023-05-27 ENCOUNTER — Other Ambulatory Visit: Payer: Self-pay | Admitting: Nurse Practitioner

## 2023-05-31 ENCOUNTER — Ambulatory Visit: Payer: Medicaid Other

## 2023-05-31 DIAGNOSIS — M5413 Radiculopathy, cervicothoracic region: Secondary | ICD-10-CM | POA: Diagnosis not present

## 2023-05-31 DIAGNOSIS — M542 Cervicalgia: Secondary | ICD-10-CM | POA: Diagnosis not present

## 2023-05-31 DIAGNOSIS — R42 Dizziness and giddiness: Secondary | ICD-10-CM

## 2023-05-31 DIAGNOSIS — M6281 Muscle weakness (generalized): Secondary | ICD-10-CM | POA: Diagnosis not present

## 2023-05-31 DIAGNOSIS — R293 Abnormal posture: Secondary | ICD-10-CM | POA: Diagnosis not present

## 2023-05-31 DIAGNOSIS — M546 Pain in thoracic spine: Secondary | ICD-10-CM | POA: Diagnosis not present

## 2023-05-31 DIAGNOSIS — H8111 Benign paroxysmal vertigo, right ear: Secondary | ICD-10-CM | POA: Diagnosis not present

## 2023-05-31 NOTE — Therapy (Unsigned)
OUTPATIENT PHYSICAL THERAPY TREATMENT NOTE   Patient Name: Betty Cain MRN: [id number] DOB:24-Apr-1975, 48 y.o., female Today's Date: 05/31/2023   END OF SESSION:      Past Medical History:  Diagnosis Date   Allergy    Anemia    Anxiety 01/2019   Asthma    B12 deficiency    Back pain    Common migraine with intractable migraine 06/28/2017   Constipation    Diabetes (HCC) 02/2019   Fatigue    GERD (gastroesophageal reflux disease)    pos H pylori   Hypertension    controlled with medication   IBS (irritable bowel syndrome) 2005   Joint pain    Neonatal death    Vaginal delivery, full term-lived x2 hours.    Palpitations    Shortness of breath    Shortness of breath on exertion    Spinal headache    Swelling of both lower extremities    Valvular heart disease    Vitamin D deficiency 02/2019   Past Surgical History:  Procedure Laterality Date   ADENOIDECTOMY     and tonsils (as a child)   BIOPSY  08/27/2021   Procedure: BIOPSY;  Surgeon: Benancio Deeds, MD;  Location: [facility];  Service: Gastroenterology;;   boil  2003   right elbow   CESAREAN SECTION     x4   CESAREAN SECTION  06/02/2011   Procedure: CESAREAN SECTION;  Surgeon: Tilda Burrow, MD;  Location: [facility];  Service: Gynecology;  Laterality: N/A;  Primary Cesarean Section Delivery Baby Boy @ 0004, Apgars 9/9   CESAREAN SECTION N/A 12/10/2013   Procedure: REPEAT CESAREAN SECTION;  Surgeon: Catalina Antigua, MD;  Location: [facility];  Service: Obstetrics;  Laterality: N/A;   CESAREAN SECTION     colonoscopy  11/23/2018   stated had issues with sleep when in for procedure   ESOPHAGOGASTRODUODENOSCOPY (EGD) WITH PROPOFOL N/A 08/27/2021   Procedure: ESOPHAGOGASTRODUODENOSCOPY (EGD) WITH PROPOFOL;  Surgeon: Benancio Deeds, MD;  Location: [facility];  Service: Gastroenterology;  Laterality: N/A;   OTHER SURGICAL HISTORY  2005   Uterine surgery    uterine cauterization     Patient Active Problem  List   Diagnosis Date Noted   Dislocation of jaw 07/23/2022   Small vessel disease (HCC) 06/29/2022   Type 2 diabetes mellitus with obesity (HCC) 03/25/2022   Hypotension - iatrogenic 03/18/2022   Other chronic pain 02/02/2022   Dyspepsia    Early satiety    IDA (iron deficiency anemia) 01/29/2021   Paroxysmal supraventricular tachycardia (HCC) 10/01/2020   Class 1 obesity with serious comorbidity and body mass index (BMI) of 31.0 to 31.9 in adult 10/01/2020   Diabetes mellitus (HCC) 02/14/2020   Mixed diabetic hyperlipidemia associated with type 2 diabetes mellitus (HCC) 01/10/2020   Hypertension associated with diabetes (HCC) 01/10/2020   Diabetic neuropathy (HCC) 01/10/2020   Vitamin D deficiency 01/10/2020   Diabetes mellitus with proteinuria (HCC) 08/20/2019   History of Helicobacter pylori infection 08/08/2019   Proteinuria 06/14/2019   Elevated sed rate 05/23/2019   Elevated C-reactive protein (CRP) 05/23/2019   Diabetes mellitus without complication (HCC) 05/23/2019   Dyslipidemia (high LDL; low HDL) 05/23/2019   Type 2 diabetes mellitus with hyperglycemia, without long-term current use of insulin (HCC) 02/17/2019   Hemoglobin A1C between 7% and 9% indicating borderline diabetic control (HCC) 02/17/2019   Breast cancer screening by mammogram 02/17/2019   Frequent headaches 02/14/2019   Anxiety 02/14/2019   Common migraine with  intractable migraine 06/28/2017   Chest pain at rest 10/17/2016   Sinus tachycardia 08/18/2016   Prolonged Q-T interval on ECG 08/18/2016   Vertigo 07/22/2015   Abdominal discomfort 02/14/2015   Low back pain 02/14/2015   Abdominal pain 02/01/2015   Nausea without vomiting 02/01/2015   Environmental allergies 02/01/2015   Language barrier to communication 02/01/2015   Anemia 01/23/2015   Constipation 01/21/2015   Knee pain, bilateral 01/21/2015   Palpitations 09/18/2013   Shortness of breath 09/18/2013   Advanced maternal age in pregnancy in  second trimester 06/19/2013   IBS (irritable bowel syndrome) 04/13/2003    PCP: Ivonne Andrew, NP  REFERRING PROVIDER: Horton Chin, MD  REFERRING DIAG: Cervical myofascial pain syndrome [M79.18], R42 (ICD-10-CM) - Vertigo   THERAPY DIAG:  No diagnosis found.  Rationale for Evaluation and Treatment: Rehabilitation  ONSET DATE: 1+ year   SUBJECTIVE:                                                                                                                                                                                                         SUBJECTIVE STATEMENT: In-person interpreter was present throughout today's session.   Patient reports to PT with   Patient reports that she is no longer having numbness in her head. However, she does have a headache/migraine on L>R side. She has dizziness when putting something on an overhead shelf at home. She believes that she has fibromyalgia d/t pain in multiple areas.   Hand dominance: Right  PERTINENT HISTORY:  Relevant PMHx includes anemia, anxiety, B12 deficiency, DM, HTN, IBS, DOE, tachycardia, diabetic neuropathy   PAIN:  Are you having pain?  Yes: NPRS scale: 8/10 Pain location: BIL neck/shoulder  Pain description: "pain"  Aggravating factors: looking over her shoulder, looking up to the ceiling, turning head quickly  Relieving factors: trigger point injections (a little improvement), hot pack, pain killer    PRECAUTIONS: None  RED FLAGS: None     WEIGHT BEARING RESTRICTIONS: No  FALLS:  Has patient fallen in last 6 months? No  LIVING ENVIRONMENT: Lives with: lives with their family Stairs: No  OCCUPATION: Homemaking  PLOF: Independent  PATIENT GOALS: To get rid of the pain completely  NEXT MD VISIT: 04/21/2023  OBJECTIVE:  Note: Objective measures were completed at Evaluation unless otherwise noted.  DIAGNOSTIC FINDINGS:  04/09/2022 Thoracic Spine -- Negative   04/09/2022 Right shoulder  -- Negative   PATIENT SURVEYS:  FOTO 47 current, 57 predicted   COGNITION: Overall cognitive status: Within functional limits for tasks assessed  SENSATION: Not tested  POSTURE: forward head  CERVICAL ROM:   Active ROM A/PROM (deg) eval  Flexion WFL  Extension WFL, p!  Right lateral flexion WFL  Left lateral flexion WFL , P!  Right rotation WFL  Left rotation WFL   (Blank rows = not tested)  UPPER EXTREMITY ROM:  Active ROM Right eval Left eval  Shoulder flexion [facility]   Shoulder extension    Shoulder abduction WFL, p! [facility]  Shoulder adduction    Shoulder extension    Shoulder internal rotation    Shoulder external rotation    Elbow flexion    Elbow extension    Wrist flexion    Wrist extension    Wrist ulnar deviation    Wrist radial deviation    Wrist pronation    Wrist supination     (Blank rows = not tested)  UPPER EXTREMITY MMT:  MMT Right eval Left eval Right 05/31/23 Left 05/31/23  Shoulder flexion 4- 4- 4 4+  Shoulder extension      Shoulder abduction 4- 4- 4+ 4+  Shoulder adduction      Shoulder extension      Shoulder internal rotation      Shoulder external rotation      Middle trapezius 3+ 3+ 4 4  Lower trapezius 3+ 3+ 4 4  Elbow flexion      Elbow extension      Wrist flexion      Wrist extension      Wrist ulnar deviation      Wrist radial deviation      Wrist pronation      Wrist supination      Grip strength       (Blank rows = not tested)   VESTIBULAR ASSESSMENT performed 05/05/23:   GENERAL OBSERVATION: dizziness when performing supine to sit that last <1 minute; patient is blinking and closing her eyes in effort to mitigate dizziness    SYMPTOM BEHAVIOR:  Subjective history: Patient has experienced vertigo symptoms every couple of years since 2017.   Non-Vestibular symptoms: neck pain and tinnitus  Type of dizziness: Spinning/Vertigo  Frequency: daily with bed mobility   Duration: < 1 min   Aggravating factors:  Induced by position change: rolling to the right and supine to sit  Relieving factors: head stationary and closing eyes  Progression of symptoms: worse   POSITIONAL TESTING: Right Dix-Hallpike: dizziness, nystagmus and Duration: <20 seconds      TODAY'S TREATMENT:    [facility] Adult PT Treatment:                                                DATE: 05/31/23  Therapeutic Exercise: UBE, 1 min fwd/76min backward Rows GTB 2x10 Shoulder extension GTB 2x10 Triceps pressdown GTB 2x10 Seated double ER with scap retraction RTB 2x10 Chin tucks into pillow 2x10 Isometric CS side bending, 5 sec x 15 against manual resistance from clinician   Manual Therapy: STM BIL suboccipital release, BIL UT, levator mm Passive stretch of BIL UT and levator mm  Cervical distraction/compression - describes less pain with compression  Neuro reed:  Patient education regarding nervous system involvement in current pain patterns and diagnoses  [facility] Adult PT Treatment:  DATE: 05/25/23  Therapeutic Exercise: UBE, 2 min fwd/84min backward Rows GTB 2x10 Shoulder extension GTB 2x10 Triceps pressdown GTB 2x10 Seated double ER with scap retraction RTB 2x10 Chin tucks into pillow 2x10 Isometric CS side bending, 5 sec x 15 against manual resistance from clinician   Manual Therapy: STM BIL suboccipital release, BIL UT, levator mm Passive stretch of BIL UT and levator mm  Cervical distraction/compression - describes less pain with compression  Neuro reed:  Patient education regarding nervous system involvement in current pain patterns and diagnoses    [facility] Adult PT Treatment:                                                DATE: 05/24/23 Therapeutic Exercise: Nustep level 5 x 6 mins Lifting 5# weight into cabinet bottom middle shelf BIL UE 2x10 Rows GTB 2x10 Shoulder extension RTB 2x10 Triceps pressdown RTB 2x10 Seated UT stretch 2x30" ea BIL  Seated double ER with  scap retraction RTB 2x10 Chin tucks into pillow 2x10 Manual Therapy: STM BIL suboccipital release, BIL UT, levator mm Passive stretch of BIL UT and levator mm  Cervical distraction/compression - describes less pain with compression Self Care: Theracane self instruction   [facility] Adult PT Treatment:                                                DATE: 05/19/2023  Therapeutic Exercise: Nustep level 5 x 6 mins Seated double ER with scap retraction RTB 2x10 Seated horizontal abduction RTB 2x10  Manual Therapy:  STM BIL suboccipital release, BIL UT, levator mm, BIL Rhomboids Passive stretch of BIL UT and levator mm   Neuromuscular Reed and Self-Care:  R dix hallpike assessment followed by RIGHT epley maneuver  followed by patient education for 4 hours aftercare (symptom monitoring)  Patient education regarding related to postural precautions and symptoms monitoring, change in symptoms.                                                          [facility] Adult PT Treatment:                                                DATE: 05/11/23 Therapeutic Exercise: Nustep level 5 x 6 mins Rows RTB 2x10 Shoulder extension RTB 2x10 Triceps pressdown RTB 2x10 Cervical retraction with ball, 3 sec x 20  Seated double ER with scap retraction RTB 2x10 Seated horizontal abduction RTB 2x10 Seated cervical ext over towel 3" hold 2x10 UT stretch with contralateral table grab 2 x 30 sec each  Levator 2 x 30 sec each    PATIENT EDUCATION:  Education details: reviewed initial home exercise program; discussion of POC, prognosis and goals for skilled PT   Person educated: Patient Education method: Explanation, Demonstration, and Handouts Education comprehension: verbalized understanding  HOME EXERCISE PROGRAM: Access Code: [email or web address] URL: [email or web address] Date: 05/19/2023 Prepared by: Mauri Reading  Exercises - Supine Cervical Rotation AROM on Pillow  - 2 x daily - 7 x weekly - 1 sets -  10 reps - Supine Cervical Retraction with Towel  - 2 x daily - 7 x weekly - 1 sets - 15-20 reps - 3 sec hold - Seated Scapular Retraction  - 2 x daily - 7 x weekly - 1 sets - 15-20 reps - 3 sec hold - Seated Gentle Upper Trapezius Stretch  - 1 x daily - 7 x weekly - 2 reps - 20 sec hold - Gentle Levator Scapulae Stretch  - 1 x daily - 7 x weekly - 2 reps - 20 sec hold - Standing Shoulder Row with Anchored Resistance  - 1 x daily - 7 x weekly - 2 sets - 10 reps - Shoulder extension with resistance - Neutral  - 1 x daily - 7 x weekly - 2 sets - 10 reps  ASSESSMENT:  CLINICAL IMPRESSION:  Patient had improved tolerance of session with increased strengthening and stabilization activities. She continue to report pain with resistance band exercises that is consistent with expected exercise-induced "muscle burn." She would like help understanding how to use her massage device and will bring it to next appt. She understands plan for discharge from [facility] at next visit with updated HEP and recommendation to follow up with referring provider.      OBJECTIVE IMPAIRMENTS: decreased activity tolerance, decreased endurance, decreased mobility, decreased ROM, decreased strength, impaired UE functional use, improper body mechanics, postural dysfunction, and pain.   ACTIVITY LIMITATIONS: carrying, lifting, sleeping, reach over head, hygiene/grooming, and caring for others  PARTICIPATION LIMITATIONS: meal prep, cleaning, driving, and community activity  PERSONAL FACTORS: Age, Fitness, Past/current experiences, Time since onset of injury/illness/exacerbation, and 3+ comorbidities: Relevant PMHx includes anemia, anxiety, B12 deficiency, DM, HTN, IBS, DOE, tachycardia, diabetic neuropathy   are also affecting patient's functional outcome.   REHAB POTENTIAL: Fair    CLINICAL DECISION MAKING: Evolving/moderate complexity  EVALUATION COMPLEXITY: Moderate   GOALS: Goals reviewed with patient?  Yes  SHORT TERM GOALS: Target date: 04/22/2023   Patient will be independent with initial home program for cervical ROM and periscapular strengthening.  Baseline: provided at eval  Goal status: MET 05/19/23  2.  Patient will demonstrate improved postural awareness for at least 15 minutes while seated without need for cueing from PT.   Baseline: see objective measures  Goal status: MET 05/19/23   LONG TERM GOALS: Target date: 06/03/2023, last updated 05/05/23   Patient will report improved overall functional ability with FOTO score of 55 or greater Baseline: 47 Goal status: ONGOING  2.  Patient will demonstrate ability to perform CS AROM WFL in all directions with no more than 3/10 pain.  Baseline: see objective measures  Goal status: ONGOING  3.  Patient will demonstrate improved shoulder and periscapular MMT scores to at least 4+/5 in order to improved tolerance of ADLs and IADLs.  Baseline: see objective measures  Goal status: Partially MET  4.  Patient will demonstrate ability to perform overhead lifting of at least 10# using appropriate body mechanics and with no more than minimal pain in order to safely perform normal daily/occupational tasks.   Baseline: unable to tolerate d/t pain  Goal status: Not met; still has pain  5.  Patient will demonstrate ability to perform floor to waist lifting of at least 25# using appropriate body mechanics and with no more than minimal pain in order to safely perform normal daily/occupational  tasks.   Baseline: unable to tolerate d/t pain  Goal status: MET  6. Patient will report absence of dizziness when performing supine to sit for at least 1 week.   Baseline: daily with position changes  Goal status: NOT MET   PLAN:  PT FREQUENCY: 1-2x/week   PT DURATION: 2 weeks, last updated 05/19/23  PLANNED INTERVENTIONS: 97164- PT Re-evaluation, 97110-Therapeutic exercises, 97530- Therapeutic activity, 97112- Neuromuscular re-education, 97535- Self  Care, 84696- Manual therapy, [phone]- Canalith repositioning, [id number]- Electrical stimulation (manual), Patient/Family education, Taping, Dry Needling, Joint mobilization, Spinal mobilization, Cryotherapy, and Moist heat  PLAN FOR NEXT SESSION: address CS ROM, periscapular strengthening, thoracic mobility, isometric-> concentric shoulder strengthening; canalith repositioning as indicated   Mauri Reading, PT, DPT  05/31/2023 10:00 AM

## 2023-06-02 ENCOUNTER — Encounter
Payer: Medicaid Other | Attending: Physical Medicine and Rehabilitation | Admitting: Physical Medicine and Rehabilitation

## 2023-06-02 VITALS — BP 88/60 | HR 93 | Ht <= 58 in | Wt 131.0 lb

## 2023-06-02 DIAGNOSIS — M7918 Myalgia, other site: Secondary | ICD-10-CM | POA: Insufficient documentation

## 2023-06-02 MED ORDER — PREGABALIN 50 MG PO CAPS: 50.0000 mg | ORAL_CAPSULE | Freq: Every evening | ORAL | 3 refills | Status: DC

## 2023-06-02 MED ORDER — SODIUM CHLORIDE (PF) 0.9 % IJ SOLN
2.0000 mL | Freq: Once | INTRAMUSCULAR | Status: AC
  Administered 2023-06-02: 2 mL via INTRAVENOUS

## 2023-06-02 MED ORDER — LIDOCAINE HCL 1 % IJ SOLN
5.0000 mL | Freq: Once | INTRAMUSCULAR | Status: AC
  Administered 2023-06-02: 5 mL via INTRADERMAL

## 2023-06-02 MED ORDER — DICLOFENAC SODIUM 1 % EX GEL: 2.0000 g | Freq: Four times a day (QID) | CUTANEOUS | 3 refills | Status: DC

## 2023-06-02 NOTE — Progress Notes (Signed)
 Trigger Point Injection  Indication: Cervical myofascial pain not relieved by medication management and other conservative care.  Informed consent was obtained after describing risk and benefits of the procedure with the patient, this includes bleeding, bruising, infection and medication side effects.  The patient wishes to proceed and has given written consent.  The patient was placed in a seated position.  The area of pain was marked and prepped with Betadine.  It was entered with a 25-gauge 1/2 inch needle and a total of 5 mL of 1% lidocaine and normal saline was injected into a total of 4 trigger points, after negative draw back for blood.  The patient tolerated the procedure well.  Post procedure instructions were given.

## 2023-06-02 NOTE — Addendum Note (Signed)
Addended by: Silas Sacramento T on: 06/02/2023 12:34 PM   Modules accepted: Orders

## 2023-06-04 ENCOUNTER — Other Ambulatory Visit: Payer: Self-pay | Admitting: Nurse Practitioner

## 2023-06-06 ENCOUNTER — Ambulatory Visit: Payer: Medicaid Other | Admitting: Physical Therapy

## 2023-06-06 ENCOUNTER — Other Ambulatory Visit: Payer: Self-pay

## 2023-06-06 DIAGNOSIS — M5413 Radiculopathy, cervicothoracic region: Secondary | ICD-10-CM | POA: Diagnosis not present

## 2023-06-06 DIAGNOSIS — R42 Dizziness and giddiness: Secondary | ICD-10-CM | POA: Diagnosis not present

## 2023-06-06 DIAGNOSIS — M6281 Muscle weakness (generalized): Secondary | ICD-10-CM | POA: Diagnosis not present

## 2023-06-06 DIAGNOSIS — H8111 Benign paroxysmal vertigo, right ear: Secondary | ICD-10-CM | POA: Diagnosis not present

## 2023-06-06 DIAGNOSIS — R293 Abnormal posture: Secondary | ICD-10-CM

## 2023-06-06 DIAGNOSIS — M542 Cervicalgia: Secondary | ICD-10-CM | POA: Diagnosis not present

## 2023-06-06 DIAGNOSIS — M546 Pain in thoracic spine: Secondary | ICD-10-CM | POA: Diagnosis not present

## 2023-06-06 NOTE — Telephone Encounter (Signed)
 Please advise [facility]

## 2023-06-06 NOTE — Therapy (Signed)
 OUTPATIENT PHYSICAL THERAPY CERVICAL EVALUATION   Patient Name: Betty Cain MRN: [id number] DOB:[birth year], 48 y.o., female Today's Date: 06/06/2023  END OF SESSION:  PT End of Session - 06/06/23 0748     Visit Number 1    Date for PT Re-Evaluation 07/18/23    Authorization Type [employer]    Authorization Time Period Auth request 06/06/23    PT Start Time 0800    PT Stop Time 0840    PT Time Calculation (min) 40 min             Past Medical History:  Diagnosis Date   Allergy    Anemia    Anxiety 01/2019   Asthma    B12 deficiency    Back pain    Common migraine with intractable migraine 06/28/2017   Constipation    Diabetes (HCC) 02/2019   Fatigue    GERD (gastroesophageal reflux disease)    pos H pylori   Hypertension    controlled with medication   IBS (irritable bowel syndrome) 2005   Joint pain    Neonatal death    Vaginal delivery, full term-lived x2 hours.    Palpitations    Shortness of breath    Shortness of breath on exertion    Spinal headache    Swelling of both lower extremities    Valvular heart disease    Vitamin D deficiency 02/2019   Past Surgical History:  Procedure Laterality Date   ADENOIDECTOMY     and tonsils (as a child)   BIOPSY  08/27/2021   Procedure: BIOPSY;  Surgeon: Benancio Deeds, MD;  Location: [facility];  Service: Gastroenterology;;   boil  2003   right elbow   CESAREAN SECTION     x4   CESAREAN SECTION  06/02/2011   Procedure: CESAREAN SECTION;  Surgeon: Tilda Burrow, MD;  Location: [facility];  Service: Gynecology;  Laterality: N/A;  Primary Cesarean Section Delivery Baby Boy @ 0004, Apgars 9/9   CESAREAN SECTION N/A 12/10/2013   Procedure: REPEAT CESAREAN SECTION;  Surgeon: Catalina Antigua, MD;  Location: [facility];  Service: Obstetrics;  Laterality: N/A;   CESAREAN SECTION     colonoscopy  11/23/2018   stated had issues with sleep when in for procedure   ESOPHAGOGASTRODUODENOSCOPY (EGD) WITH PROPOFOL N/A  08/27/2021   Procedure: ESOPHAGOGASTRODUODENOSCOPY (EGD) WITH PROPOFOL;  Surgeon: Benancio Deeds, MD;  Location: [facility];  Service: Gastroenterology;  Laterality: N/A;   OTHER SURGICAL HISTORY  2005   Uterine surgery    uterine cauterization     Patient Active Problem List   Diagnosis Date Noted   Dislocation of jaw 07/23/2022   Small vessel disease (HCC) 06/29/2022   Type 2 diabetes mellitus with obesity (HCC) 03/25/2022   Hypotension - iatrogenic 03/18/2022   Other chronic pain 02/02/2022   Dyspepsia    Early satiety    IDA (iron deficiency anemia) 01/29/2021   Paroxysmal supraventricular tachycardia (HCC) 10/01/2020   Class 1 obesity with serious comorbidity and body mass index (BMI) of 31.0 to 31.9 in adult 10/01/2020   Diabetes mellitus (HCC) 02/14/2020   Mixed diabetic hyperlipidemia associated with type 2 diabetes mellitus (HCC) 01/10/2020   Hypertension associated with diabetes (HCC) 01/10/2020   Diabetic neuropathy (HCC) 01/10/2020   Vitamin D deficiency 01/10/2020   Diabetes mellitus with proteinuria (HCC) 08/20/2019   History of Helicobacter pylori infection 08/08/2019   Proteinuria 06/14/2019   Elevated sed rate 05/23/2019   Elevated C-reactive protein (CRP)  05/23/2019   Diabetes mellitus without complication (HCC) 05/23/2019   Dyslipidemia (high LDL; low HDL) 05/23/2019   Type 2 diabetes mellitus with hyperglycemia, without long-term current use of insulin (HCC) 02/17/2019   Hemoglobin A1C between 7% and 9% indicating borderline diabetic control (HCC) 02/17/2019   Breast cancer screening by mammogram 02/17/2019   Frequent headaches 02/14/2019   Anxiety 02/14/2019   Common migraine with intractable migraine 06/28/2017   Chest pain at rest 10/17/2016   Sinus tachycardia 08/18/2016   Prolonged Q-T interval on ECG 08/18/2016   Vertigo 07/22/2015   Abdominal discomfort 02/14/2015   Low back pain 02/14/2015   Abdominal pain 02/01/2015   Nausea without  vomiting 02/01/2015   Environmental allergies 02/01/2015   Language barrier to communication 02/01/2015   Anemia 01/23/2015   Constipation 01/21/2015   Knee pain, bilateral 01/21/2015   Palpitations 09/18/2013   Shortness of breath 09/18/2013   Advanced maternal age in pregnancy in second trimester 06/19/2013   IBS (irritable bowel syndrome) 04/13/2003    PCP: Ivonne Andrew, NP  REFERRING PROVIDER: Horton Chin, MD  REFERRING DIAG: M79.18 (ICD-10-CM) - Cervical myofascial pain syndrome  THERAPY DIAG:  Cervicalgia  Abnormal posture  Muscle weakness (generalized)  Dizziness and giddiness  Radiculopathy, cervicothoracic region  Rationale for Evaluation and Treatment: Rehabilitation  ONSET DATE: 3-4 months  SUBJECTIVE:                                                                                                                                                                                                         SUBJECTIVE STATEMENT: Pt states she has been referred for her neck pain -- thinks it may be nerve related. Pt reports she feels severe neck pain. Pt states the neck pain goes down her shoulder and into both arms. Pt has just finished course of PT from [facility] on 05/30/22. Pt reports feeling intermittent N/T and dizziness. Pt feels most dizzy when she first wakes up from her sleep. Pt states she can do all of her housework but with pain.  Hand dominance: Right  PERTINENT HISTORY:  Has had 2 courses of PT in the past year  PAIN:  Are you having pain? Yes: NPRS scale: 7 or 8 currently Pain location: neck to bilat shoulders Pain description: very sharp Aggravating factors: any activities using her arms Relieving factors: Steroid injection  PRECAUTIONS: None  RED FLAGS: None   WEIGHT BEARING RESTRICTIONS: No  FALLS:  Has patient fallen in last 6 months? No  LIVING ENVIRONMENT: Lives with: lives with their family  husband and kids Lives in:  House/apartment  OCCUPATION: Mostly doing house work  PLOF: Independent  PATIENT GOALS: Improve pain  NEXT MD VISIT: n/a  OBJECTIVE:  Note: Objective measures were completed at Evaluation unless otherwise noted.  DIAGNOSTIC FINDINGS:  Thoracic spine x-ray 04/09/22 (-) R shoulder x-ray 04/09/22 (-) No new imaging of her neck seen on Epic  PATIENT SURVEYS:  Neck Disability Index score: 18 / 50 = 36.0 %  COGNITION: Overall cognitive status: Within functional limits for tasks assessed  SENSATION: N/T along mid neck/base of skull and can go along temples  POSTURE: forward head  PALPATION: Greatest tenderness in bilat cervical paraspinals and R>L UTs   CERVICAL ROM: a little dizziness with ext  Active ROM A/PROM (deg) eval  Flexion 35  Extension 30  Right lateral flexion 31 tightness on R  Left lateral flexion 38 tightness on R  Right rotation 38  Left rotation 35 pulls on R   (Blank rows = not tested)  UPPER EXTREMITY ROM:  Active ROM Right eval Left eval  Shoulder flexion    Shoulder extension    Shoulder abduction    Shoulder adduction    Shoulder extension    Shoulder internal rotation    Shoulder external rotation    Elbow flexion    Elbow extension    Wrist flexion    Wrist extension    Wrist ulnar deviation    Wrist radial deviation    Wrist pronation    Wrist supination     (Blank rows = not tested)  UPPER EXTREMITY MMT:  MMT Right eval Left eval  Shoulder flexion 5 5  Shoulder extension 3+ 4-  Shoulder abduction 4- 5  Shoulder adduction    Shoulder extension    Shoulder internal rotation 4- 5  Shoulder external rotation 3+ 5  Middle trapezius    Lower trapezius    Elbow flexion    Elbow extension    Wrist flexion    Wrist extension    Wrist ulnar deviation    Wrist radial deviation    Wrist pronation    Wrist supination    Grip strength     (Blank rows = not tested)  CERVICAL SPECIAL TESTS:  Cranial cervical flexion test:  Positive and Spurling's test: Negative Unable to maintain cervical flexion at all Nerve tension test: Ulnar nerve (+) on R; Radial nerve (+) on R>L; Median nerve (+) on R  FUNCTIONAL TESTS:  Cervical flexion test: 1 sec before increased shoulder compensation and pt starts to use anterior neck muscles Chin tuck in standing pt is unable to perform without bilat shoulder elevation or pt just flexing c-spine  TREATMENT DATE: 06/06/23 See HEP below                                                                                                                                 PATIENT EDUCATION:  Education details: Exam findings, POC,  initial HEP Person educated: Patient Education method: Explanation, Demonstration, and Handouts Education comprehension: verbalized understanding, returned demonstration, and needs further education  HOME EXERCISE PROGRAM: Access Code: [email or web address] URL: [email or web address] Date: 06/06/2023 Prepared by: Vernon Prey April Kirstie Peri  Exercises - Seated Scapular Retraction  - 1 x daily - 7 x weekly - 2 sets - 10 reps - Ulnar Nerve Mobilization - Low Level (Mirrored)  - 1 x daily - 7 x weekly - 2 sets - 10 reps - Ulnar Nerve/Median Glide- Low Level  - 1 x daily - 7 x weekly - 2 sets - 10 reps  ASSESSMENT:  CLINICAL IMPRESSION: Patient is a 48 y.o. F who was seen today for physical therapy evaluation and treatment for neck pain with radiating symptoms down bilat UEs. Assessment is significant for R>L cervical muscle tightness, ongoing R>L UE weakness, and tenderness and (+) nerve tension tests on R for ulnar, median and radial nerve. Pt continues to have very weak deep neck flexors and extensors -- unable to properly perform chin tuck without compensatory movements. Pt will highly benefit from PT to address these issues for improved comfort with home tasks.   OBJECTIVE IMPAIRMENTS: decreased activity tolerance, decreased coordination, decreased endurance,  decreased mobility, decreased ROM, decreased strength, dizziness, increased fascial restrictions, increased muscle spasms, impaired flexibility, impaired sensation, impaired UE functional use, postural dysfunction, and pain.   ACTIVITY LIMITATIONS: carrying, lifting, sleeping, bathing, toileting, dressing, reach over head, hygiene/grooming, and caring for others  PARTICIPATION LIMITATIONS: meal prep, cleaning, laundry, driving, shopping, and community activity  PERSONAL FACTORS: Age, Fitness, Past/current experiences, and Time since onset of injury/illness/exacerbation are also affecting patient's functional outcome.   REHAB POTENTIAL: Good  CLINICAL DECISION MAKING: Evolving/moderate complexity  EVALUATION COMPLEXITY: Moderate   GOALS: Goals reviewed with patient? Yes  SHORT TERM GOALS: Target date: 06/27/2023   Pt will be ind with initial HEP Baseline:  Goal status: INITIAL  2.  Pt will be able to maintain cervical flexion testing for >/=10 sec to demo improving neck stability Baseline:  Goal status: INITIAL   LONG TERM GOALS: Target date: 07/18/2023   Pt will be ind with management and progression of HEP Baseline:  Goal status: INITIAL  2.  Pt will report no tightness or issues with nerve tension testing Baseline:  Goal status: INITIAL  3.  Pt will be able to maintain cervical flexion for >/=20 sec to demo increasing neck stability Baseline:  Goal status: INITIAL  4.  Pt will demo R = L UE strength for lifting and carrying tasks at home Baseline:  Goal status: INITIAL  5.  Pt will have improved NDI to </=26% to demo MCID Baseline:  Goal status: INITIAL     PLAN:  PT FREQUENCY: 2x/week  PT DURATION: 6 weeks  PLANNED INTERVENTIONS: 97164- PT Re-evaluation, 97110-Therapeutic exercises, 97530- Therapeutic activity, 97112- Neuromuscular re-education, 97535- Self Care, [id number]- Manual therapy, [id number]- Aquatic Therapy, [id number]- Electrical stimulation (unattended),  97035- Ultrasound, [id number]- Ionotophoresis 4mg /ml Dexamethasone, Patient/Family education, Taping, Dry Needling, Joint mobilization, Spinal mobilization, Cryotherapy, Moist heat, and Biofeedback  PLAN FOR NEXT SESSION: Assess response to HEP. Work on correctly engaging deep neck flexors and extensors! Nerve glides and UE strengthening as tolerated.    Nikiah Goin April Ma L Nayden Czajka, PT 06/06/2023, 9:14 AM  For all possible CPT codes, reference the Planned Interventions line above.     Check all conditions that are expected to impact treatment: {Conditions expected to impact treatment:Musculoskeletal disorders   If treatment provided at initial evaluation, no  treatment charged due to lack of authorization.

## 2023-06-08 ENCOUNTER — Ambulatory Visit: Payer: Medicaid Other | Admitting: Physical Therapy

## 2023-06-08 DIAGNOSIS — R293 Abnormal posture: Secondary | ICD-10-CM | POA: Diagnosis not present

## 2023-06-08 DIAGNOSIS — M6281 Muscle weakness (generalized): Secondary | ICD-10-CM | POA: Diagnosis not present

## 2023-06-08 DIAGNOSIS — H8111 Benign paroxysmal vertigo, right ear: Secondary | ICD-10-CM | POA: Diagnosis not present

## 2023-06-08 DIAGNOSIS — M546 Pain in thoracic spine: Secondary | ICD-10-CM | POA: Diagnosis not present

## 2023-06-08 DIAGNOSIS — M542 Cervicalgia: Secondary | ICD-10-CM | POA: Diagnosis not present

## 2023-06-08 DIAGNOSIS — R42 Dizziness and giddiness: Secondary | ICD-10-CM | POA: Diagnosis not present

## 2023-06-08 DIAGNOSIS — M5413 Radiculopathy, cervicothoracic region: Secondary | ICD-10-CM | POA: Diagnosis not present

## 2023-06-08 NOTE — Therapy (Signed)
 OUTPATIENT PHYSICAL THERAPY TREATMENT   Patient Name: Betty Cain MRN: [id number] DOB:July 31, 1975, 48 y.o., female Today's Date: 06/08/2023  END OF SESSION:  PT End of Session - 06/08/23 1004     Visit Number 2    Date for PT Re-Evaluation 07/18/23    Authorization Type [employer]    Authorization Time Period Auth request 06/06/23    Authorization - Visit Number 11    Authorization - Number of Visits 27    PT Start Time 1005    PT Stop Time 1045    PT Time Calculation (min) 40 min    Activity Tolerance Patient tolerated treatment well    Behavior During Therapy [facility] for tasks assessed/performed              Past Medical History:  Diagnosis Date   Allergy    Anemia    Anxiety 01/2019   Asthma    B12 deficiency    Back pain    Common migraine with intractable migraine 06/28/2017   Constipation    Diabetes (HCC) 02/2019   Fatigue    GERD (gastroesophageal reflux disease)    pos H pylori   Hypertension    controlled with medication   IBS (irritable bowel syndrome) 2005   Joint pain    Neonatal death    Vaginal delivery, full term-lived x2 hours.    Palpitations    Shortness of breath    Shortness of breath on exertion    Spinal headache    Swelling of both lower extremities    Valvular heart disease    Vitamin D deficiency 02/2019   Past Surgical History:  Procedure Laterality Date   ADENOIDECTOMY     and tonsils (as a child)   BIOPSY  08/27/2021   Procedure: BIOPSY;  Surgeon: Benancio Deeds, MD;  Location: [facility];  Service: Gastroenterology;;   boil  2003   right elbow   CESAREAN SECTION     x4   CESAREAN SECTION  06/02/2011   Procedure: CESAREAN SECTION;  Surgeon: Tilda Burrow, MD;  Location: [facility];  Service: Gynecology;  Laterality: N/A;  Primary Cesarean Section Delivery Baby Boy @ 0004, Apgars 9/9   CESAREAN SECTION N/A 12/10/2013   Procedure: REPEAT CESAREAN SECTION;  Surgeon: Catalina Antigua, MD;  Location: [facility];  Service:  Obstetrics;  Laterality: N/A;   CESAREAN SECTION     colonoscopy  11/23/2018   stated had issues with sleep when in for procedure   ESOPHAGOGASTRODUODENOSCOPY (EGD) WITH PROPOFOL N/A 08/27/2021   Procedure: ESOPHAGOGASTRODUODENOSCOPY (EGD) WITH PROPOFOL;  Surgeon: Benancio Deeds, MD;  Location: [facility];  Service: Gastroenterology;  Laterality: N/A;   OTHER SURGICAL HISTORY  2005   Uterine surgery    uterine cauterization     Patient Active Problem List   Diagnosis Date Noted   Dislocation of jaw 07/23/2022   Small vessel disease (HCC) 06/29/2022   Type 2 diabetes mellitus with obesity (HCC) 03/25/2022   Hypotension - iatrogenic 03/18/2022   Other chronic pain 02/02/2022   Dyspepsia    Early satiety    IDA (iron deficiency anemia) 01/29/2021   Paroxysmal supraventricular tachycardia (HCC) 10/01/2020   Class 1 obesity with serious comorbidity and body mass index (BMI) of 31.0 to 31.9 in adult 10/01/2020   Diabetes mellitus (HCC) 02/14/2020   Mixed diabetic hyperlipidemia associated with type 2 diabetes mellitus (HCC) 01/10/2020   Hypertension associated with diabetes (HCC) 01/10/2020   Diabetic neuropathy (HCC) 01/10/2020  Vitamin D deficiency 01/10/2020   Diabetes mellitus with proteinuria (HCC) 08/20/2019   History of Helicobacter pylori infection 08/08/2019   Proteinuria 06/14/2019   Elevated sed rate 05/23/2019   Elevated C-reactive protein (CRP) 05/23/2019   Diabetes mellitus without complication (HCC) 05/23/2019   Dyslipidemia (high LDL; low HDL) 05/23/2019   Type 2 diabetes mellitus with hyperglycemia, without long-term current use of insulin (HCC) 02/17/2019   Hemoglobin A1C between 7% and 9% indicating borderline diabetic control (HCC) 02/17/2019   Breast cancer screening by mammogram 02/17/2019   Frequent headaches 02/14/2019   Anxiety 02/14/2019   Common migraine with intractable migraine 06/28/2017   Chest pain at rest 10/17/2016   Sinus tachycardia  08/18/2016   Prolonged Q-T interval on ECG 08/18/2016   Vertigo 07/22/2015   Abdominal discomfort 02/14/2015   Low back pain 02/14/2015   Abdominal pain 02/01/2015   Nausea without vomiting 02/01/2015   Environmental allergies 02/01/2015   Language barrier to communication 02/01/2015   Anemia 01/23/2015   Constipation 01/21/2015   Knee pain, bilateral 01/21/2015   Palpitations 09/18/2013   Shortness of breath 09/18/2013   Advanced maternal age in pregnancy in second trimester 06/19/2013   IBS (irritable bowel syndrome) 04/13/2003    PCP: Ivonne Andrew, NP  REFERRING PROVIDER: Ivonne Andrew, NP  REFERRING DIAG: M79.18 (ICD-10-CM) - Cervical myofascial pain syndrome  THERAPY DIAG:  No diagnosis found.  Rationale for Evaluation and Treatment: Rehabilitation  ONSET DATE: 3-4 months  SUBJECTIVE:                                                                                                                                                                                                         SUBJECTIVE STATEMENT: Pt states she is doing bad this morning. Her neck was fine last night but she states she woke up this morning with increased pain. Feels it radiating down her R arm/wrist today.  From eval: Pt states she has been referred for her neck pain -- thinks it may be nerve related. Pt reports she feels severe neck pain. Pt states the neck pain goes down her shoulder and into both arms. Pt has just finished course of PT from [facility] on 05/30/22. Pt reports feeling intermittent N/T and dizziness. Pt feels most dizzy when she first wakes up from her sleep. Pt states she can do all of her housework but with pain.  Hand dominance: Right  PERTINENT HISTORY:  Has had 2 courses of PT in the past year  PAIN:  Are you having pain? Yes: NPRS  scale: 9 or 10 Pain location: neck to bilat shoulders Pain description: very sharp Aggravating factors: any activities using her  arms Relieving factors: Steroid injection  PRECAUTIONS: None  WEIGHT BEARING RESTRICTIONS: No  FALLS:  Has patient fallen in last 6 months? No  OCCUPATION: Mostly doing house work  PATIENT GOALS: Improve pain  NEXT MD VISIT: n/a  OBJECTIVE:  Note: Objective measures were completed at Evaluation unless otherwise noted.  DIAGNOSTIC FINDINGS:  Thoracic spine x-ray 04/09/22 (-) R shoulder x-ray 04/09/22 (-) No new imaging of her neck seen on Epic  PATIENT SURVEYS:  Neck Disability Index score: 18 / 50 = 36.0 %  SENSATION: N/T along mid neck/base of skull and can go along temples  POSTURE: forward head  PALPATION: Greatest tenderness in bilat cervical paraspinals and R>L UTs   CERVICAL ROM: a little dizziness with ext  Active ROM A/PROM (deg) eval  Flexion 35  Extension 30  Right lateral flexion 31 tightness on R  Left lateral flexion 38 tightness on R  Right rotation 38  Left rotation 35 pulls on R   (Blank rows = not tested)  UPPER EXTREMITY ROM:  Active ROM Right eval Left eval  Shoulder flexion    Shoulder extension    Shoulder abduction    Shoulder adduction    Shoulder extension    Shoulder internal rotation    Shoulder external rotation    Elbow flexion    Elbow extension    Wrist flexion    Wrist extension    Wrist ulnar deviation    Wrist radial deviation    Wrist pronation    Wrist supination     (Blank rows = not tested)  UPPER EXTREMITY MMT:  MMT Right eval Left eval  Shoulder flexion 5 5  Shoulder extension 3+ 4-  Shoulder abduction 4- 5  Shoulder adduction    Shoulder extension    Shoulder internal rotation 4- 5  Shoulder external rotation 3+ 5  Middle trapezius    Lower trapezius    Elbow flexion    Elbow extension    Wrist flexion    Wrist extension    Wrist ulnar deviation    Wrist radial deviation    Wrist pronation    Wrist supination    Grip strength     (Blank rows = not tested)  CERVICAL SPECIAL TESTS:   Cranial cervical flexion test: Positive and Spurling's test: Negative Unable to maintain cervical flexion at all Nerve tension test: Ulnar nerve (+) on R; Radial nerve (+) on R>L; Median nerve (+) on R  FUNCTIONAL TESTS:  Cervical flexion test: 1 sec before increased shoulder compensation and pt starts to use anterior neck muscles Chin tuck in standing pt is unable to perform without bilat shoulder elevation or pt just flexing c-spine  TREATMENT DATE:  06/08/23 Therex: Pulleys flexion, abd x1 min each Scap squeeze supine 2x10 Shoulder ER supine 2x10 with multiple cues for form and correct muscle activation  Neuro re-ed (heating pad around her neck): Cervical PROM (flexion, lateral flexion, and flexion + lateral flexion) to increase intervertebral foramen opening to decrease radiculopathy Deep neck flexor training with BP cuff 10x5"  Deep neck extensor training with BP cuff 10x5" Low level ulnar nerve glide x10 Low level median nerve glide x10 Low level radial nerve glide x10 Discussed what to do to try and decrease pressure on her nerve and HEP   06/06/23 See HEP below  PATIENT EDUCATION:  Education details: Exam findings, POC, initial HEP Person educated: Patient Education method: Explanation, Demonstration, and Handouts Education comprehension: verbalized understanding, returned demonstration, and needs further education  HOME EXERCISE PROGRAM: Access Code: [email or web address] URL: [email or web address] Date: 06/08/2023 Prepared by: Vernon Prey April Kirstie Peri  Exercises - Seated Scapular Retraction  - 1 x daily - 7 x weekly - 2 sets - 10 reps - Ulnar Nerve Mobilization - Low Level (Mirrored)  - 1 x daily - 7 x weekly - 2 sets - 10 reps - Ulnar Nerve/Median Glide- Low Level  - 1 x daily - 7 x weekly - 2 sets - 10 reps - Supine Deep Neck Flexor  Training  - 1 x daily - 7 x weekly - 2 sets - 10 reps - Supine Cervical Retraction with Towel  - 1 x daily - 7 x weekly - 2 sets - 10 reps - Gentle Levator Scapulae Stretch (Mirrored)  - 1 x daily - 7 x weekly - 2 sets - 30 sec hold  ASSESSMENT:  CLINICAL IMPRESSION: Pt unable to tolerate manual traction; however, did report relief of her radicular symptoms down her R arm with cervical ROM upon increasing intervertebral foramen opening. Worked on deep neck muscle activation, strength and coordination today without compensatory neck or shoulder movements. Pt requires constant cueing and supervision to keep from compensatory movements.   From eval: Patient is a 48 y.o. F who was seen today for physical therapy evaluation and treatment for neck pain with radiating symptoms down bilat UEs. Assessment is significant for R>L cervical muscle tightness, ongoing R>L UE weakness, and tenderness and (+) nerve tension tests on R for ulnar, median and radial nerve. Pt continues to have very weak deep neck flexors and extensors -- unable to properly perform chin tuck without compensatory movements. Pt will highly benefit from PT to address these issues for improved comfort with home tasks.   OBJECTIVE IMPAIRMENTS: decreased activity tolerance, decreased coordination, decreased endurance, decreased mobility, decreased ROM, decreased strength, dizziness, increased fascial restrictions, increased muscle spasms, impaired flexibility, impaired sensation, impaired UE functional use, postural dysfunction, and pain.    GOALS: Goals reviewed with patient? Yes  SHORT TERM GOALS: Target date: 06/27/2023   Pt will be ind with initial HEP Baseline:  Goal status: INITIAL  2.  Pt will be able to maintain cervical flexion testing for >/=10 sec to demo improving neck stability Baseline:  Goal status: INITIAL   LONG TERM GOALS: Target date: 07/18/2023   Pt will be ind with management and progression of HEP Baseline:   Goal status: INITIAL  2.  Pt will report no tightness or issues with nerve tension testing Baseline:  Goal status: INITIAL  3.  Pt will be able to maintain cervical flexion for >/=20 sec to demo increasing neck stability Baseline:  Goal status: INITIAL  4.  Pt will demo R = L UE strength for lifting and carrying tasks at home Baseline:  Goal status: INITIAL  5.  Pt will have improved NDI to </=26% to demo MCID Baseline:  Goal status: INITIAL     PLAN:  PT FREQUENCY: 2x/week  PT DURATION: 6 weeks  PLANNED INTERVENTIONS: [id number]- PT Re-evaluation, 97110-Therapeutic exercises, 97530- Therapeutic activity, 97112- Neuromuscular re-education, 97535- Self Care, [id number]- Manual therapy, [id number]- Aquatic Therapy, [id number]- Electrical stimulation (unattended), 97035- Ultrasound, [id number]- Ionotophoresis 4mg /ml Dexamethasone, Patient/Family education, Taping, Dry Needling, Joint mobilization, Spinal mobilization, Cryotherapy, Moist heat, and Biofeedback  PLAN FOR NEXT SESSION: Assess response to HEP. Work on  correctly engaging deep neck flexors and extensors! Nerve glides and UE strengthening as tolerated.    Keaden Gunnoe April Ma L Ashwath Lasch, PT 06/08/2023, 12:12 PM

## 2023-06-13 ENCOUNTER — Ambulatory Visit: Payer: Medicaid Other | Attending: Physical Medicine and Rehabilitation | Admitting: Physical Therapy

## 2023-06-13 DIAGNOSIS — M25511 Pain in right shoulder: Secondary | ICD-10-CM | POA: Diagnosis not present

## 2023-06-13 DIAGNOSIS — M546 Pain in thoracic spine: Secondary | ICD-10-CM | POA: Diagnosis not present

## 2023-06-13 DIAGNOSIS — M6281 Muscle weakness (generalized): Secondary | ICD-10-CM | POA: Insufficient documentation

## 2023-06-13 DIAGNOSIS — M542 Cervicalgia: Secondary | ICD-10-CM | POA: Insufficient documentation

## 2023-06-13 DIAGNOSIS — M5459 Other low back pain: Secondary | ICD-10-CM | POA: Diagnosis not present

## 2023-06-13 DIAGNOSIS — R278 Other lack of coordination: Secondary | ICD-10-CM | POA: Diagnosis not present

## 2023-06-13 DIAGNOSIS — R293 Abnormal posture: Secondary | ICD-10-CM | POA: Diagnosis not present

## 2023-06-13 DIAGNOSIS — G8929 Other chronic pain: Secondary | ICD-10-CM | POA: Insufficient documentation

## 2023-06-13 DIAGNOSIS — M5413 Radiculopathy, cervicothoracic region: Secondary | ICD-10-CM | POA: Insufficient documentation

## 2023-06-13 NOTE — Therapy (Signed)
 OUTPATIENT PHYSICAL THERAPY TREATMENT   Patient Name: Betty Cain MRN: [id number] DOB:[birth year], 48 y.o., female Today's Date: 06/13/2023  END OF SESSION:  PT End of Session - 06/13/23 1057     Visit Number 3    Date for PT Re-Evaluation 07/18/23    Authorization Type [employer]    Authorization Time Period Auth request 06/06/23    Authorization - Number of Visits 27    PT Start Time 1100    PT Stop Time 1140    PT Time Calculation (min) 40 min    Activity Tolerance Patient tolerated treatment well    Behavior During Therapy [facility] for tasks assessed/performed              Past Medical History:  Diagnosis Date   Allergy    Anemia    Anxiety 01/2019   Asthma    B12 deficiency    Back pain    Common migraine with intractable migraine 06/28/2017   Constipation    Diabetes (HCC) 02/2019   Fatigue    GERD (gastroesophageal reflux disease)    pos H pylori   Hypertension    controlled with medication   IBS (irritable bowel syndrome) 2005   Joint pain    Neonatal death    Vaginal delivery, full term-lived x2 hours.    Palpitations    Shortness of breath    Shortness of breath on exertion    Spinal headache    Swelling of both lower extremities    Valvular heart disease    Vitamin D deficiency 02/2019   Past Surgical History:  Procedure Laterality Date   ADENOIDECTOMY     and tonsils (as a child)   BIOPSY  08/27/2021   Procedure: BIOPSY;  Surgeon: Benancio Deeds, MD;  Location: [facility];  Service: Gastroenterology;;   boil  2003   right elbow   CESAREAN SECTION     x4   CESAREAN SECTION  06/02/2011   Procedure: CESAREAN SECTION;  Surgeon: Tilda Burrow, MD;  Location: [facility];  Service: Gynecology;  Laterality: N/A;  Primary Cesarean Section Delivery Baby Boy @ 0004, Apgars 9/9   CESAREAN SECTION N/A 12/10/2013   Procedure: REPEAT CESAREAN SECTION;  Surgeon: Catalina Antigua, MD;  Location: [facility];  Service: Obstetrics;  Laterality: N/A;   CESAREAN  SECTION     colonoscopy  11/23/2018   stated had issues with sleep when in for procedure   ESOPHAGOGASTRODUODENOSCOPY (EGD) WITH PROPOFOL N/A 08/27/2021   Procedure: ESOPHAGOGASTRODUODENOSCOPY (EGD) WITH PROPOFOL;  Surgeon: Benancio Deeds, MD;  Location: [facility];  Service: Gastroenterology;  Laterality: N/A;   OTHER SURGICAL HISTORY  2005   Uterine surgery    uterine cauterization     Patient Active Problem List   Diagnosis Date Noted   Dislocation of jaw 07/23/2022   Small vessel disease (HCC) 06/29/2022   Type 2 diabetes mellitus with obesity (HCC) 03/25/2022   Hypotension - iatrogenic 03/18/2022   Other chronic pain 02/02/2022   Dyspepsia    Early satiety    IDA (iron deficiency anemia) 01/29/2021   Paroxysmal supraventricular tachycardia (HCC) 10/01/2020   Class 1 obesity with serious comorbidity and body mass index (BMI) of 31.0 to 31.9 in adult 10/01/2020   Diabetes mellitus (HCC) 02/14/2020   Mixed diabetic hyperlipidemia associated with type 2 diabetes mellitus (HCC) 01/10/2020   Hypertension associated with diabetes (HCC) 01/10/2020   Diabetic neuropathy (HCC) 01/10/2020   Vitamin D deficiency 01/10/2020   Diabetes mellitus  with proteinuria (HCC) 08/20/2019   History of Helicobacter pylori infection 08/08/2019   Proteinuria 06/14/2019   Elevated sed rate 05/23/2019   Elevated C-reactive protein (CRP) 05/23/2019   Diabetes mellitus without complication (HCC) 05/23/2019   Dyslipidemia (high LDL; low HDL) 05/23/2019   Type 2 diabetes mellitus with hyperglycemia, without long-term current use of insulin (HCC) 02/17/2019   Hemoglobin A1C between 7% and 9% indicating borderline diabetic control (HCC) 02/17/2019   Breast cancer screening by mammogram 02/17/2019   Frequent headaches 02/14/2019   Anxiety 02/14/2019   Common migraine with intractable migraine 06/28/2017   Chest pain at rest 10/17/2016   Sinus tachycardia 08/18/2016   Prolonged Q-T interval on ECG  08/18/2016   Vertigo 07/22/2015   Abdominal discomfort 02/14/2015   Low back pain 02/14/2015   Abdominal pain 02/01/2015   Nausea without vomiting 02/01/2015   Environmental allergies 02/01/2015   Language barrier to communication 02/01/2015   Anemia 01/23/2015   Constipation 01/21/2015   Knee pain, bilateral 01/21/2015   Palpitations 09/18/2013   Shortness of breath 09/18/2013   Advanced maternal age in pregnancy in second trimester 06/19/2013   IBS (irritable bowel syndrome) 04/13/2003    PCP: Ivonne Andrew, NP  REFERRING PROVIDER: Ivonne Andrew, NP  REFERRING DIAG: [phone] (ICD-10-CM) - Cervical myofascial pain syndrome  THERAPY DIAG:  Cervicalgia  Abnormal posture  Muscle weakness (generalized)  Radiculopathy, cervicothoracic region  Pain in thoracic spine  Chronic right shoulder pain  Other lack of coordination  Rationale for Evaluation and Treatment: Rehabilitation  ONSET DATE: 3-4 months  SUBJECTIVE:                                                                                                                                                                                                         SUBJECTIVE STATEMENT: Pt states she has been sick -- has had allergies. States she has been doing her exercises. Has had less pain. Did note some weird sensation with one of her cervical exercises.   From eval: Pt states she has been referred for her neck pain -- thinks it may be nerve related. Pt reports she feels severe neck pain. Pt states the neck pain goes down her shoulder and into both arms. Pt has just finished course of PT from [facility] on 05/30/22. Pt reports feeling intermittent N/T and dizziness. Pt feels most dizzy when she first wakes up from her sleep. Pt states she can do all of her housework but with pain.  Hand dominance: Right  PERTINENT HISTORY:  Has had 2  courses of PT in the past year  PAIN:  Are you having pain? Yes: NPRS scale:  7 Pain location: neck to bilat shoulders Pain description: very sharp Aggravating factors: any activities using her arms Relieving factors: Steroid injection  PRECAUTIONS: None  WEIGHT BEARING RESTRICTIONS: No  FALLS:  Has patient fallen in last 6 months? No  OCCUPATION: Mostly doing house work  PATIENT GOALS: Improve pain  NEXT MD VISIT: n/a  OBJECTIVE:  Note: Objective measures were completed at Evaluation unless otherwise noted.  DIAGNOSTIC FINDINGS:  Thoracic spine x-ray 04/09/22 (-) R shoulder x-ray 04/09/22 (-) No new imaging of her neck seen on Epic  PATIENT SURVEYS:  Neck Disability Index score: 18 / 50 = 36.0 %  SENSATION: N/T along mid neck/base of skull and can go along temples  POSTURE: forward head  PALPATION: Greatest tenderness in bilat cervical paraspinals and R>L UTs   CERVICAL ROM: a little dizziness with ext  Active ROM A/PROM (deg) eval  Flexion 35  Extension 30  Right lateral flexion 31 tightness on R  Left lateral flexion 38 tightness on R  Right rotation 38  Left rotation 35 pulls on R   (Blank rows = not tested)  UPPER EXTREMITY ROM:  Active ROM Right eval Left eval  Shoulder flexion    Shoulder extension    Shoulder abduction    Shoulder adduction    Shoulder extension    Shoulder internal rotation    Shoulder external rotation    Elbow flexion    Elbow extension    Wrist flexion    Wrist extension    Wrist ulnar deviation    Wrist radial deviation    Wrist pronation    Wrist supination     (Blank rows = not tested)  UPPER EXTREMITY MMT:  MMT Right eval Left eval  Shoulder flexion 5 5  Shoulder extension 3+ 4-  Shoulder abduction 4- 5  Shoulder adduction    Shoulder extension    Shoulder internal rotation 4- 5  Shoulder external rotation 3+ 5  Middle trapezius    Lower trapezius    Elbow flexion    Elbow extension    Wrist flexion    Wrist extension    Wrist ulnar deviation    Wrist radial deviation     Wrist pronation    Wrist supination    Grip strength     (Blank rows = not tested)  CERVICAL SPECIAL TESTS:  Cranial cervical flexion test: Positive and Spurling's test: Negative Unable to maintain cervical flexion at all Nerve tension test: Ulnar nerve (+) on R; Radial nerve (+) on R>L; Median nerve (+) on R  FUNCTIONAL TESTS:  Cervical flexion test: 1 sec before increased shoulder compensation and pt starts to use anterior neck muscles Chin tuck in standing pt is unable to perform without bilat shoulder elevation or pt just flexing c-spine  TREATMENT DATE:  06/13/23 Therex Pulleys flexion, abd x2 min each Scap squeeze sitting 2x10 Thoracic ext x10  Neuro re-ed  Supine Deep neck flexor training with BP cuff 10x5" from 25 mmHg to 20 mmHg Supine Deep neck extensor training with BP cuff 10x5" from 20 mmHg to 30 mmHg Supine Low Shoulder horizontal abd red TB 2x10 Supine Shoulder ext red TB 2x10 Supine Cervical rotation iso 8x5" Sitting ulnar nerve butterfly x10 Sitting "week 1" Median nerve glide (see below) x10 Sittng radial nerve glide x10  PATIENT EDUCATION:  Education details: Exam findings, POC, initial HEP Person educated: Patient Education method: Explanation, Demonstration, and Handouts Education comprehension: verbalized understanding, returned demonstration, and needs further education  HOME EXERCISE PROGRAM: Access Code: [email or web address] URL: [email or web address] Date: 06/13/2023 Prepared by: Vernon Prey April Kirstie Peri  Exercises - Seated Scapular Retraction  - 1 x daily - 7 x weekly - 2 sets - 10 reps - Supine Deep Neck Flexor Training  - 1 x daily - 7 x weekly - 2 sets - 10 reps - Supine Cervical Retraction with Towel  - 1 x daily - 7 x weekly - 2 sets - 10 reps - Gentle Levator Scapulae Stretch (Mirrored)  - 1 x daily - 7 x weekly  - 2 sets - 30 sec hold - Standing Radial Nerve Glide  - 1 x daily - 7 x weekly - 2 sets - 10 reps - Ulnar Nerve Butterfly  - 1 x daily - 7 x weekly - 1 sets - 10 reps - Supine Shoulder Horizontal Abduction with Resistance  - 1 x daily - 7 x weekly - 2 sets - 10 reps    ASSESSMENT:  CLINICAL IMPRESSION: Improved neck deep neck extensor and flexor activation today. Working on improving midback and postural strengthening -- pt requires multiple cues to keep from incorrectly using neck/shoulder muscles. Most of pain along her upper cervical spine and suboccipitals today. Added neck rotation iso to try and address; requires cues to keep from lateral neck flexion.   From eval: Patient is a 48 y.o. F who was seen today for physical therapy evaluation and treatment for neck pain with radiating symptoms down bilat UEs. Assessment is significant for R>L cervical muscle tightness, ongoing R>L UE weakness, and tenderness and (+) nerve tension tests on R for ulnar, median and radial nerve. Pt continues to have very weak deep neck flexors and extensors -- unable to properly perform chin tuck without compensatory movements. Pt will highly benefit from PT to address these issues for improved comfort with home tasks.   OBJECTIVE IMPAIRMENTS: decreased activity tolerance, decreased coordination, decreased endurance, decreased mobility, decreased ROM, decreased strength, dizziness, increased fascial restrictions, increased muscle spasms, impaired flexibility, impaired sensation, impaired UE functional use, postural dysfunction, and pain.    GOALS: Goals reviewed with patient? Yes  SHORT TERM GOALS: Target date: 06/27/2023   Pt will be ind with initial HEP Baseline:  Goal status: INITIAL  2.  Pt will be able to maintain cervical flexion testing for >/=10 sec to demo improving neck stability Baseline:  Goal status: INITIAL   LONG TERM GOALS: Target date: 07/18/2023   Pt will be ind with management and  progression of HEP Baseline:  Goal status: INITIAL  2.  Pt will report no tightness or issues with nerve tension testing Baseline:  Goal status: INITIAL  3.  Pt will be able to maintain cervical flexion for >/=20 sec to demo increasing neck stability Baseline:  Goal status: INITIAL  4.  Pt will demo R = L UE strength for lifting and carrying tasks at home Baseline:  Goal status: INITIAL  5.  Pt will have improved NDI to </=26% to demo MCID Baseline:  Goal status: INITIAL     PLAN:  PT FREQUENCY: 2x/week  PT DURATION: 6 weeks  PLANNED INTERVENTIONS: 97164- PT Re-evaluation, 97110-Therapeutic exercises, 97530- Therapeutic activity, 97112- Neuromuscular re-education, 97535- Self Care, 16109- Manual therapy, [id number]- Aquatic Therapy, U0454- Electrical stimulation (unattended), 97035- Ultrasound, 09811- Ionotophoresis 4mg /ml Dexamethasone, Patient/Family education, Taping, Dry  Needling, Joint mobilization, Spinal mobilization, Cryotherapy, Moist heat, and Biofeedback  PLAN FOR NEXT SESSION: Assess response to HEP. Work on correctly engaging deep neck flexors and extensors. Nerve glides and UE strengthening as tolerated. Look for compensatory motions. Prefers private room.    Sahalie Beth April Ma L Ewel Lona, PT, DPT 06/13/2023, 10:57 AM

## 2023-06-14 ENCOUNTER — Encounter (INDEPENDENT_AMBULATORY_CARE_PROVIDER_SITE_OTHER): Payer: Self-pay | Admitting: Adult Health

## 2023-06-14 ENCOUNTER — Ambulatory Visit (INDEPENDENT_AMBULATORY_CARE_PROVIDER_SITE_OTHER): Payer: Medicaid Other | Admitting: Adult Health

## 2023-06-14 VITALS — BP 95/63 | HR 52 | Temp 99.1°F | Ht <= 58 in | Wt 129.0 lb

## 2023-06-14 DIAGNOSIS — E669 Obesity, unspecified: Secondary | ICD-10-CM

## 2023-06-14 DIAGNOSIS — E0822 Diabetes mellitus due to underlying condition with diabetic chronic kidney disease: Secondary | ICD-10-CM

## 2023-06-14 DIAGNOSIS — N1832 Chronic kidney disease, stage 3b: Secondary | ICD-10-CM

## 2023-06-14 DIAGNOSIS — R5383 Other fatigue: Secondary | ICD-10-CM | POA: Diagnosis not present

## 2023-06-14 DIAGNOSIS — R109 Unspecified abdominal pain: Secondary | ICD-10-CM | POA: Diagnosis not present

## 2023-06-14 DIAGNOSIS — I152 Hypertension secondary to endocrine disorders: Secondary | ICD-10-CM | POA: Diagnosis not present

## 2023-06-14 DIAGNOSIS — E0859 Diabetes mellitus due to underlying condition with other circulatory complications: Secondary | ICD-10-CM

## 2023-06-14 DIAGNOSIS — Z6827 Body mass index (BMI) 27.0-27.9, adult: Secondary | ICD-10-CM

## 2023-06-14 DIAGNOSIS — Z6831 Body mass index (BMI) 31.0-31.9, adult: Secondary | ICD-10-CM

## 2023-06-14 DIAGNOSIS — Z7984 Long term (current) use of oral hypoglycemic drugs: Secondary | ICD-10-CM | POA: Diagnosis not present

## 2023-06-14 NOTE — Progress Notes (Signed)
 WEIGHT SUMMARY AND BIOMETRICS  Vitals Temp: 99.1 F (37.3 C) BP: 95/63 Pulse Rate: (!) 52 SpO2: 97 %   Anthropometric Measurements Height: 4\' 10"  (1.473 m) Weight: 129 lb (58.5 kg) BMI (Calculated): 26.97 Weight at Last Visit: 127 lb Weight Lost Since Last Visit: 0 Weight Gained Since Last Visit: 2 lb Starting Weight: 152 lb Total Weight Loss (lbs): 23 lb (10.4 kg)   Body Composition  Body Fat %: 39 % Fat Mass (lbs): 50.4 lbs Muscle Mass (lbs): 75 lbs Total Body Water (lbs): 55.8 lbs Visceral Fat Rating : 7   Other Clinical Data Fasting: no Labs: no Today's Visit #: 76 Starting Date: 01/10/20    Chief Complaint:   OBESITY [name] is here to discuss her progress with her obesity treatment plan.  She is on the the Category 2 Plan and states she is following her eating plan approximately 50 % of the time.  She states she is exercising YouTube and Stationary Bike 5 minutes 7 times per week.   Interim History:  She is currently observing Ramadan She will not eat or drink during daytime/sun is up. She can eat is safety is a concern, ie: hypoglycemia associated with her T2D  She has been skipping exercise during Ramadan  Of note- Betty Cain, Betty Cain, at [facility] during OV  Subjective:   1. Other fatigue Discussed Labs  Latest Reference Range & Units 05/12/23 09:25  Vitamin D, 25-Hydroxy 30.0 - 100.0 ng/mL 45.8  Vitamin B12 232 - 1,245 pg/mL 489   Vit D and B12 Levels are stable She is on OTC daily Vit D 3 1000 international units and OTC MVI  2. Hypertension associated with diabetes (HCC) Discussed Labs 05/12/2023 [id number], kidney/liver enzymes stable BP soft at OV She will experience dizziness with position changes She is on ivabradine (CORLANOR) 5 MG TABS tablet  aspirin EC 81 MG tablet  bisoprolol (ZEBETA) 5 MG tablet  metFORMIN (GLUCOPHAGE-XR) 500 MG 24 hr tablet  rosuvastatin (CRESTOR) 5 MG tablet  cloNIDine  (CATAPRES) 0.1 MG tablet   3. Abdominal pain, unspecified abdominal location She continues to experience intermittent abdominal pain. She contacted her established GI/Dr. Adela Lank She was started on Cymbalta 30mg  daily She denies current GI upset, however reports she experienced abdominal pain yesterday.  4. 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 average glucose Bld gHb Est-mCnc mg/dL 086  INSULIN 2.6 - 57.8 uIU/mL 6.2    Levels stable and at goal  She has been off weekly Ozempic since 03/02/2023, re: concerns therapy was worsening chronic abdominal pain. Started on Metformin XR 500mg  on/about 03/07/2023 PCP is managing daily Metformin XR 500mg   Assessment/Plan:   1. Other fatigue Continue OTC daily Vit D 3 1000 international units and OTC MVI  2. Hypertension associated with diabetes (HCC) (Primary) Remain well hydrated with water F/u with PCP about adjusting antihypertensive therapy, re: sx's of hypotension  3. Abdominal pain, unspecified abdominal location F/u with Dr. Adela Lank  4. 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 Obesity, current BMI 27  Betty Cain is currently in the action stage of change. As such, her goal  is to continue with weight loss efforts. She has agreed to the Category 2 Plan.   Exercise goals: For substantial health benefits, adults should do at least 150 minutes (2 hours and 30 minutes) a week of moderate-intensity, or 75 minutes (1 hour and 15 minutes) a week of vigorous-intensity aerobic physical activity, or an equivalent combination of moderate- and vigorous-intensity aerobic activity.  Aerobic activity should be performed in episodes of at least 10 minutes, and preferably, it should be spread throughout the week.  Behavioral modification strategies: increasing lean protein intake, decreasing simple carbohydrates, increasing vegetables, increasing water intake, no skipping meals, meal planning and cooking strategies, keeping healthy foods in the home, and planning for success.  Betty Cain has agreed to follow-up with our clinic in 4 weeks. She was informed of the importance of frequent follow-up visits to maximize her success with intensive lifestyle modifications for her multiple health conditions.   Objective:   Blood pressure 95/63, pulse (!) 52, temperature 99.1 F (37.3 C), height 4\' 10"  (1.473 m), weight 129 lb (58.5 kg), SpO2 97%. Body mass index is 26.96 kg/m.  General: Cooperative, alert, well developed, in no acute distress. HEENT: Conjunctivae and lids unremarkable. Cardiovascular: Regular rhythm.  Lungs: Normal work of breathing. Neurologic: No focal deficits.   Lab Results  Component Value Date   CREATININE 0.78 05/12/2023   BUN 13 05/12/2023   NA 142 05/12/2023   K 4.5 05/12/2023   CL 102 05/12/2023   CO2 21 05/12/2023   Lab Results  Component Value Date   ALT 17 05/12/2023   AST 22 05/12/2023   ALKPHOS 88 05/12/2023   BILITOT 0.5 05/12/2023   Lab Results  Component Value Date   HGBA1C 5.3 05/12/2023   HGBA1C 5.4 04/22/2023   HGBA1C 5.3 03/07/2023   HGBA1C 5.2 10/22/2022   HGBA1C 5.2 06/21/2022   Lab Results  Component Value Date   INSULIN 6.2 05/12/2023   INSULIN 6.5 03/07/2023   INSULIN 7.9 06/21/2022   INSULIN 3.8 12/28/2021   INSULIN 8.7 01/10/2020   Lab Results  Component Value Date   TSH 1.710 05/12/2023   Lab Results  Component Value Date   CHOL 144 03/07/2023   HDL 42 03/07/2023   LDLCALC 89 03/07/2023   TRIG 66 03/07/2023   CHOLHDL 3.4 03/07/2023   Lab Results  Component Value Date   VD25OH 45.8 05/12/2023   VD25OH  41.0 03/07/2023   VD25OH 51.2 06/21/2022   Lab Results  Component Value Date   WBC 13.8 (H) 12/13/2022   HGB 13.7 12/13/2022   HCT 42.0 12/13/2022   MCV 90.9 12/13/2022   PLT 316 12/13/2022   Lab Results  Component Value Date   IRON 53 10/22/2022   TIBC 253 10/22/2022   FERRITIN 295 (H) 10/22/2022    Attestation Statements:   Reviewed by clinician on day of visit: allergies, medications, problem list, medical history, surgical history, family history, social history, and previous encounter notes.  I have reviewed the above documentation for accuracy and completeness, and I agree with the above. -  Alberto Pina d. Sharra Cayabyab, NP-C

## 2023-06-15 ENCOUNTER — Telehealth: Payer: Self-pay | Admitting: Nurse Practitioner

## 2023-06-15 ENCOUNTER — Ambulatory Visit: Payer: Medicaid Other | Admitting: Physical Therapy

## 2023-06-15 DIAGNOSIS — R293 Abnormal posture: Secondary | ICD-10-CM

## 2023-06-15 DIAGNOSIS — R278 Other lack of coordination: Secondary | ICD-10-CM | POA: Diagnosis not present

## 2023-06-15 DIAGNOSIS — M6281 Muscle weakness (generalized): Secondary | ICD-10-CM

## 2023-06-15 DIAGNOSIS — M5413 Radiculopathy, cervicothoracic region: Secondary | ICD-10-CM | POA: Diagnosis not present

## 2023-06-15 DIAGNOSIS — M5459 Other low back pain: Secondary | ICD-10-CM | POA: Diagnosis not present

## 2023-06-15 DIAGNOSIS — M546 Pain in thoracic spine: Secondary | ICD-10-CM | POA: Diagnosis not present

## 2023-06-15 DIAGNOSIS — M542 Cervicalgia: Secondary | ICD-10-CM | POA: Diagnosis not present

## 2023-06-15 DIAGNOSIS — G8929 Other chronic pain: Secondary | ICD-10-CM | POA: Diagnosis not present

## 2023-06-15 DIAGNOSIS — M25511 Pain in right shoulder: Secondary | ICD-10-CM | POA: Diagnosis not present

## 2023-06-15 NOTE — Telephone Encounter (Deleted)
 Called patient using interpreter to advise to go to the mobile bus today or tomorrow or to the urgent care or emergency room for symptoms. Patient states she will not go due to her dislike of the emergency room and she is preparing for tomorrow and cannot go.  Patient was given the address for the mobile unit on 3/6 in case she changed her mind.  Patient confirmed understanding and stated she has an appointment in late March. Patient advised not to wait until then to address symptoms and to call [phone] on Thursday morning to see if we have any cancellations.  Pt confirmed.

## 2023-06-15 NOTE — Telephone Encounter (Signed)
Error

## 2023-06-15 NOTE — Therapy (Signed)
 OUTPATIENT PHYSICAL THERAPY TREATMENT   Patient Name: Betty Cain MRN: [id number] DOB:05/21/1975, 48 y.o., female Today's Date: 06/15/2023  END OF SESSION:  PT End of Session - 06/15/23 1101     Visit Number 4    Date for PT Re-Evaluation 07/18/23    Authorization Type [employer]    Authorization Time Period Auth request 06/06/23    Authorization - Number of Visits 27    PT Start Time 1100    PT Stop Time 1145    PT Time Calculation (min) 45 min    Activity Tolerance Patient tolerated treatment well    Behavior During Therapy [facility] for tasks assessed/performed               Past Medical History:  Diagnosis Date   Allergy    Anemia    Anxiety 01/2019   Asthma    B12 deficiency    Back pain    Common migraine with intractable migraine 06/28/2017   Constipation    Diabetes (HCC) 02/2019   Fatigue    GERD (gastroesophageal reflux disease)    pos H pylori   Hypertension    controlled with medication   IBS (irritable bowel syndrome) 2005   Joint pain    Neonatal death    Vaginal delivery, full term-lived x2 hours.    Palpitations    Shortness of breath    Shortness of breath on exertion    Spinal headache    Swelling of both lower extremities    Valvular heart disease    Vitamin D deficiency 02/2019   Past Surgical History:  Procedure Laterality Date   ADENOIDECTOMY     and tonsils (as a child)   BIOPSY  08/27/2021   Procedure: BIOPSY;  Surgeon: Benancio Deeds, MD;  Location: [facility];  Service: Gastroenterology;;   boil  2003   right elbow   CESAREAN SECTION     x4   CESAREAN SECTION  06/02/2011   Procedure: CESAREAN SECTION;  Surgeon: Tilda Burrow, MD;  Location: [facility];  Service: Gynecology;  Laterality: N/A;  Primary Cesarean Section Delivery Baby Boy @ 0004, Apgars 9/9   CESAREAN SECTION N/A 12/10/2013   Procedure: REPEAT CESAREAN SECTION;  Surgeon: Catalina Antigua, MD;  Location: [facility];  Service: Obstetrics;  Laterality: N/A;    CESAREAN SECTION     colonoscopy  11/23/2018   stated had issues with sleep when in for procedure   ESOPHAGOGASTRODUODENOSCOPY (EGD) WITH PROPOFOL N/A 08/27/2021   Procedure: ESOPHAGOGASTRODUODENOSCOPY (EGD) WITH PROPOFOL;  Surgeon: Benancio Deeds, MD;  Location: [facility];  Service: Gastroenterology;  Laterality: N/A;   OTHER SURGICAL HISTORY  2005   Uterine surgery    uterine cauterization     Patient Active Problem List   Diagnosis Date Noted   Dislocation of jaw 07/23/2022   Small vessel disease (HCC) 06/29/2022   Type 2 diabetes mellitus with obesity (HCC) 03/25/2022   Hypotension - iatrogenic 03/18/2022   Other chronic pain 02/02/2022   Dyspepsia    Early satiety    IDA (iron deficiency anemia) 01/29/2021   Paroxysmal supraventricular tachycardia (HCC) 10/01/2020   Class 1 obesity with serious comorbidity and body mass index (BMI) of 31.0 to 31.9 in adult 10/01/2020   Diabetes mellitus (HCC) 02/14/2020   Mixed diabetic hyperlipidemia associated with type 2 diabetes mellitus (HCC) 01/10/2020   Hypertension associated with diabetes (HCC) 01/10/2020   Diabetic neuropathy (HCC) 01/10/2020   Vitamin D deficiency 01/10/2020   Diabetes  mellitus with proteinuria (HCC) 08/20/2019   History of Helicobacter pylori infection 08/08/2019   Proteinuria 06/14/2019   Elevated sed rate 05/23/2019   Elevated C-reactive protein (CRP) 05/23/2019   Diabetes mellitus without complication (HCC) 05/23/2019   Dyslipidemia (high LDL; low HDL) 05/23/2019   Type 2 diabetes mellitus with hyperglycemia, without long-term current use of insulin (HCC) 02/17/2019   Hemoglobin A1C between 7% and 9% indicating borderline diabetic control (HCC) 02/17/2019   Breast cancer screening by mammogram 02/17/2019   Frequent headaches 02/14/2019   Anxiety 02/14/2019   Common migraine with intractable migraine 06/28/2017   Chest pain at rest 10/17/2016   Sinus tachycardia 08/18/2016   Prolonged Q-T interval  on ECG 08/18/2016   Vertigo 07/22/2015   Abdominal discomfort 02/14/2015   Low back pain 02/14/2015   Abdominal pain 02/01/2015   Nausea without vomiting 02/01/2015   Environmental allergies 02/01/2015   Language barrier to communication 02/01/2015   Anemia 01/23/2015   Constipation 01/21/2015   Knee pain, bilateral 01/21/2015   Palpitations 09/18/2013   Shortness of breath 09/18/2013   Advanced maternal age in pregnancy in second trimester 06/19/2013   IBS (irritable bowel syndrome) 04/13/2003    PCP: Ivonne Andrew, NP  REFERRING PROVIDER: Horton Chin, MD  REFERRING DIAG: M79.18 (ICD-10-CM) - Cervical myofascial pain syndrome  THERAPY DIAG:  Cervicalgia  Abnormal posture  Muscle weakness (generalized)  Radiculopathy, cervicothoracic region  Pain in thoracic spine  Rationale for Evaluation and Treatment: Rehabilitation  ONSET DATE: 3-4 months  SUBJECTIVE:                                                                                                                                                                                                         SUBJECTIVE STATEMENT: Pt reports her pain and symptoms are doing better today, however she reports that she has started having pain from the top of her head down to her neck when she gets into a prayer position that requires her to lean forwards and touch her head to the floor. Pt reports that she first noticed this pain 2 days ago, unable to modify her prayer positions despite having this pain. Pt also reports that while praying she has to hold this position for about 2 minutes at a time.  Pt reports she has been working on her HEP, the nerve glides have been helpful and have decreased her symptoms overall.   From eval: Pt states she has been referred for her neck pain -- thinks it may be nerve related. Pt reports she feels  severe neck pain. Pt states the neck pain goes down her shoulder and into both arms. Pt  has just finished course of PT from [facility] on 05/30/22. Pt reports feeling intermittent N/T and dizziness. Pt feels most dizzy when she first wakes up from her sleep. Pt states she can do all of her housework but with pain.   Accompanied by: interpreter: Enayat Hand dominance: Right  PERTINENT HISTORY:  Has had 2 courses of PT in the past year  PAIN:  Are you having pain? Yes: NPRS scale: 7, gets worse than a 7 in prayer position Pain location: neck to bilat shoulders Pain description: very sharp Aggravating factors: any activities using her arms Relieving factors: Steroid injection  PRECAUTIONS: None  WEIGHT BEARING RESTRICTIONS: No  FALLS:  Has patient fallen in last 6 months? No  OCCUPATION: Mostly doing house work  PATIENT GOALS: Improve pain  NEXT MD VISIT: n/a  OBJECTIVE:  Note: Objective measures were completed at Evaluation unless otherwise noted.  DIAGNOSTIC FINDINGS:  Thoracic spine x-ray 04/09/22 (-) R shoulder x-ray 04/09/22 (-) No new imaging of her neck seen on Epic  PATIENT SURVEYS:  Neck Disability Index score: 18 / 50 = 36.0 %  SENSATION: N/T along mid neck/base of skull and can go along temples  POSTURE: forward head  PALPATION: Greatest tenderness in bilat cervical paraspinals and R>L UTs   CERVICAL ROM: a little dizziness with ext  Active ROM A/PROM (deg) eval  Flexion 35  Extension 30  Right lateral flexion 31 tightness on R  Left lateral flexion 38 tightness on R  Right rotation 38  Left rotation 35 pulls on R   (Blank rows = not tested)  UPPER EXTREMITY ROM:  Active ROM Right eval Left eval  Shoulder flexion    Shoulder extension    Shoulder abduction    Shoulder adduction    Shoulder extension    Shoulder internal rotation    Shoulder external rotation    Elbow flexion    Elbow extension    Wrist flexion    Wrist extension    Wrist ulnar deviation    Wrist radial deviation    Wrist pronation    Wrist  supination     (Blank rows = not tested)  UPPER EXTREMITY MMT:  MMT Right eval Left eval  Shoulder flexion 5 5  Shoulder extension 3+ 4-  Shoulder abduction 4- 5  Shoulder adduction    Shoulder extension    Shoulder internal rotation 4- 5  Shoulder external rotation 3+ 5  Middle trapezius    Lower trapezius    Elbow flexion    Elbow extension    Wrist flexion    Wrist extension    Wrist ulnar deviation    Wrist radial deviation    Wrist pronation    Wrist supination    Grip strength     (Blank rows = not tested)  CERVICAL SPECIAL TESTS:  Cranial cervical flexion test: Positive and Spurling's test: Negative Unable to maintain cervical flexion at all Nerve tension test: Ulnar nerve (+) on R; Radial nerve (+) on R>L; Median nerve (+) on R  FUNCTIONAL TESTS:  Cervical flexion test: 1 sec before increased shoulder compensation and pt starts to use anterior neck muscles Chin tuck in standing pt is unable to perform without bilat shoulder elevation or pt just flexing c-spine  TREATMENT DATE:  06/15/23 TherAct Assessed Slump Test bilaterally (-) on RLE (+) on LLE but reports no relief of N/T  with nerve glides Child's pose in attempt to elicit her pain she gets while praying Pt does have onset of similar pain down L and R side of her neck and down into her RUE, worse on R side  TherEx To address pain/tightness elicited in child's pose position: Seated UT stretch 3 x 30 sec each B Seated levator scap stretch 3 x 30 sec each B Relief of symptoms following stretching  To work on postural strengthening/stability: Prone R shoulder flexion x 10 reps Onset of N/T in forearm/hand and up towards her shoulder following exercise, relieved once pt returned to sitting Sitting B shoulder flexion x 10 reps Onset of N/T in R triceps and lats Supine B shoulder flexion x 10 reps Supine B shoulder abduction x 10 reps Pt just has muscle soreness with supine exercises Supine serratus punch  2 x 10 reps B Pt exhibits decreased stability in her RUE as compared to her LUE Needs frequent verbal and manual cues to perform exercise correctly and keep arm straight, especially on her R side Wall slides shoulder flexion x 10 reps B Wall slides shoulder scaption x 10 reps B  Added wall slides to HEP, see bolded below   PATIENT EDUCATION:  Education details: continue HEP, added to HEP Person educated: Patient Education method: Programmer, multimedia, Demonstration, and Handouts Education comprehension: verbalized understanding, returned demonstration, and needs further education  HOME EXERCISE PROGRAM: Access Code: [email or web address] URL: [email or web address] Date: 06/13/2023 Prepared by: Vernon Prey April Kirstie Peri  Exercises - Seated Scapular Retraction  - 1 x daily - 7 x weekly - 2 sets - 10 reps - Supine Deep Neck Flexor Training  - 1 x daily - 7 x weekly - 2 sets - 10 reps - Supine Cervical Retraction with Towel  - 1 x daily - 7 x weekly - 2 sets - 10 reps - Gentle Levator Scapulae Stretch (Mirrored)  - 1 x daily - 7 x weekly - 2 sets - 30 sec hold - Standing Radial Nerve Glide  - 1 x daily - 7 x weekly - 2 sets - 10 reps - Ulnar Nerve Butterfly  - 1 x daily - 7 x weekly - 1 sets - 10 reps - Supine Shoulder Horizontal Abduction with Resistance  - 1 x daily - 7 x weekly - 2 sets - 10 reps - Shoulder Flexion Wall Slide with Towel  - 1 x daily - 7 x weekly - 3 sets - 10 reps - Scaption Wall Slide with Towel  - 1 x daily - 7 x weekly - 3 sets - 10 reps    ASSESSMENT:  CLINICAL IMPRESSION: Emphasis of skilled PT session on attempts to determine cause of her nerve pain during positioning when praying as well as continuing to work on strengthening of postural stabilization muscles. Per pt report she is unable to modify her current prayer position, could attempt to use yoga blocks next session for postural modifications if patient is agreeable. Pt also continues to exhibit weakness in  her postural stabilization muscles and needs verbal and manual cues to perform exercises correctly. Pt continues to benefit from skilled PT services to work towards improving her strength and joint stability and increasing her independence with management of her pain symptoms. Continue POC.   From eval: Patient is a 48 y.o. F who was seen today for physical therapy evaluation and treatment for neck pain with radiating symptoms down bilat UEs. Assessment is significant for R>L cervical muscle tightness, ongoing R>L UE weakness, and  tenderness and (+) nerve tension tests on R for ulnar, median and radial nerve. Pt continues to have very weak deep neck flexors and extensors -- unable to properly perform chin tuck without compensatory movements. Pt will highly benefit from PT to address these issues for improved comfort with home tasks.   OBJECTIVE IMPAIRMENTS: decreased activity tolerance, decreased coordination, decreased endurance, decreased mobility, decreased ROM, decreased strength, dizziness, increased fascial restrictions, increased muscle spasms, impaired flexibility, impaired sensation, impaired UE functional use, postural dysfunction, and pain.    GOALS: Goals reviewed with patient? Yes  SHORT TERM GOALS: Target date: 06/27/2023   Pt will be ind with initial HEP Baseline:  Goal status: INITIAL  2.  Pt will be able to maintain cervical flexion testing for >/=10 sec to demo improving neck stability Baseline:  Goal status: INITIAL   LONG TERM GOALS: Target date: 07/18/2023   Pt will be ind with management and progression of HEP Baseline:  Goal status: INITIAL  2.  Pt will report no tightness or issues with nerve tension testing Baseline:  Goal status: INITIAL  3.  Pt will be able to maintain cervical flexion for >/=20 sec to demo increasing neck stability Baseline:  Goal status: INITIAL  4.  Pt will demo R = L UE strength for lifting and carrying tasks at home Baseline:  Goal  status: INITIAL  5.  Pt will have improved NDI to </=26% to demo MCID Baseline:  Goal status: INITIAL     PLAN:  PT FREQUENCY: 2x/week  PT DURATION: 6 weeks  PLANNED INTERVENTIONS: [id number]- PT Re-evaluation, 97110-Therapeutic exercises, 97530- Therapeutic activity, 97112- Neuromuscular re-education, 97535- Self Care, [id number]- Manual therapy, [id number]- Aquatic Therapy, [id number]- Electrical stimulation (unattended), 97035- Ultrasound, 95621- Ionotophoresis 4mg /ml Dexamethasone, Patient/Family education, Taping, Dry Needling, Joint mobilization, Spinal mobilization, Cryotherapy, Moist heat, and Biofeedback  PLAN FOR NEXT SESSION: Assess response to HEP. Work on correctly engaging deep neck flexors and extensors. Nerve glides and UE strengthening as tolerated. Look for compensatory motions. Prefers private room. , assess dizziness with positional changes, try yoga block under her bottom during prayer? (Not sure if she is able to place one under her head or if her head must touch the floor)   Peter Congo, PT Peter Congo, PT, DPT, CSRS  06/15/2023, 11:47 AM

## 2023-06-15 NOTE — Telephone Encounter (Signed)
 Called patient using interpreter to advise to go to the mobile bus today or tomorrow or to the urgent care or emergency room for symptoms. Patient states she will not go due to her dislike of the emergency room and she is preparing for tomorrow and cannot go.  Patient was given the address for the mobile unit on 3/6 in case she changed her mind.  Patient confirmed understanding and stated she has an appointment in late March. Patient advised not to wait until then to address symptoms and to call [phone] on Thursday morning to see if we have any cancellations.  Pt confirmed.

## 2023-06-20 ENCOUNTER — Ambulatory Visit: Payer: Medicaid Other | Admitting: Physical Therapy

## 2023-06-20 DIAGNOSIS — R293 Abnormal posture: Secondary | ICD-10-CM

## 2023-06-20 DIAGNOSIS — M25511 Pain in right shoulder: Secondary | ICD-10-CM | POA: Diagnosis not present

## 2023-06-20 DIAGNOSIS — M5413 Radiculopathy, cervicothoracic region: Secondary | ICD-10-CM

## 2023-06-20 DIAGNOSIS — M542 Cervicalgia: Secondary | ICD-10-CM | POA: Diagnosis not present

## 2023-06-20 DIAGNOSIS — M546 Pain in thoracic spine: Secondary | ICD-10-CM | POA: Diagnosis not present

## 2023-06-20 DIAGNOSIS — G8929 Other chronic pain: Secondary | ICD-10-CM

## 2023-06-20 DIAGNOSIS — M5459 Other low back pain: Secondary | ICD-10-CM | POA: Diagnosis not present

## 2023-06-20 DIAGNOSIS — M6281 Muscle weakness (generalized): Secondary | ICD-10-CM

## 2023-06-20 DIAGNOSIS — R278 Other lack of coordination: Secondary | ICD-10-CM

## 2023-06-20 NOTE — Therapy (Signed)
 OUTPATIENT PHYSICAL THERAPY TREATMENT   Patient Name: Betty Cain MRN: [id number] DOB:17-Dec-1975, 48 y.o., female Today's Date: 06/20/2023  END OF SESSION:  PT End of Session - 06/20/23 1102     Visit Number 5    Date for PT Re-Evaluation 07/18/23    Authorization Type [employer]    Authorization Time Period Auth request 06/06/23    Authorization - Number of Visits 27    PT Start Time 1102    PT Stop Time 1145    PT Time Calculation (min) 43 min    Activity Tolerance Patient tolerated treatment well    Behavior During Therapy [facility] for tasks assessed/performed               Past Medical History:  Diagnosis Date   Allergy    Anemia    Anxiety 01/2019   Asthma    B12 deficiency    Back pain    Common migraine with intractable migraine 06/28/2017   Constipation    Diabetes (HCC) 02/2019   Fatigue    GERD (gastroesophageal reflux disease)    pos H pylori   Hypertension    controlled with medication   IBS (irritable bowel syndrome) 2005   Joint pain    Neonatal death    Vaginal delivery, full term-lived x2 hours.    Palpitations    Shortness of breath    Shortness of breath on exertion    Spinal headache    Swelling of both lower extremities    Valvular heart disease    Vitamin D deficiency 02/2019   Past Surgical History:  Procedure Laterality Date   ADENOIDECTOMY     and tonsils (as a child)   BIOPSY  08/27/2021   Procedure: BIOPSY;  Surgeon: Benancio Deeds, MD;  Location: [facility];  Service: Gastroenterology;;   boil  2003   right elbow   CESAREAN SECTION     x4   CESAREAN SECTION  06/02/2011   Procedure: CESAREAN SECTION;  Surgeon: Tilda Burrow, MD;  Location: [facility];  Service: Gynecology;  Laterality: N/A;  Primary Cesarean Section Delivery Baby Boy @ 0004, Apgars 9/9   CESAREAN SECTION N/A 12/10/2013   Procedure: REPEAT CESAREAN SECTION;  Surgeon: Catalina Antigua, MD;  Location: [facility];  Service: Obstetrics;  Laterality: N/A;    CESAREAN SECTION     colonoscopy  11/23/2018   stated had issues with sleep when in for procedure   ESOPHAGOGASTRODUODENOSCOPY (EGD) WITH PROPOFOL N/A 08/27/2021   Procedure: ESOPHAGOGASTRODUODENOSCOPY (EGD) WITH PROPOFOL;  Surgeon: Benancio Deeds, MD;  Location: [facility];  Service: Gastroenterology;  Laterality: N/A;   OTHER SURGICAL HISTORY  2005   Uterine surgery    uterine cauterization     Patient Active Problem List   Diagnosis Date Noted   Dislocation of jaw 07/23/2022   Small vessel disease (HCC) 06/29/2022   Type 2 diabetes mellitus with obesity (HCC) 03/25/2022   Hypotension - iatrogenic 03/18/2022   Other chronic pain 02/02/2022   Dyspepsia    Early satiety    IDA (iron deficiency anemia) 01/29/2021   Paroxysmal supraventricular tachycardia (HCC) 10/01/2020   Class 1 obesity with serious comorbidity and body mass index (BMI) of 31.0 to 31.9 in adult 10/01/2020   Diabetes mellitus (HCC) 02/14/2020   Mixed diabetic hyperlipidemia associated with type 2 diabetes mellitus (HCC) 01/10/2020   Hypertension associated with diabetes (HCC) 01/10/2020   Diabetic neuropathy (HCC) 01/10/2020   Vitamin D deficiency 01/10/2020   Diabetes  mellitus with proteinuria (HCC) 08/20/2019   History of Helicobacter pylori infection 08/08/2019   Proteinuria 06/14/2019   Elevated sed rate 05/23/2019   Elevated C-reactive protein (CRP) 05/23/2019   Diabetes mellitus without complication (HCC) 05/23/2019   Dyslipidemia (high LDL; low HDL) 05/23/2019   Type 2 diabetes mellitus with hyperglycemia, without long-term current use of insulin (HCC) 02/17/2019   Hemoglobin A1C between 7% and 9% indicating borderline diabetic control (HCC) 02/17/2019   Breast cancer screening by mammogram 02/17/2019   Frequent headaches 02/14/2019   Anxiety 02/14/2019   Common migraine with intractable migraine 06/28/2017   Chest pain at rest 10/17/2016   Sinus tachycardia 08/18/2016   Prolonged Q-T interval  on ECG 08/18/2016   Vertigo 07/22/2015   Abdominal discomfort 02/14/2015   Low back pain 02/14/2015   Abdominal pain 02/01/2015   Nausea without vomiting 02/01/2015   Environmental allergies 02/01/2015   Language barrier to communication 02/01/2015   Anemia 01/23/2015   Constipation 01/21/2015   Knee pain, bilateral 01/21/2015   Palpitations 09/18/2013   Shortness of breath 09/18/2013   Advanced maternal age in pregnancy in second trimester 06/19/2013   IBS (irritable bowel syndrome) 04/13/2003    PCP: Ivonne Andrew, NP  REFERRING PROVIDER: Horton Chin, MD  REFERRING DIAG: M79.18 (ICD-10-CM) - Cervical myofascial pain syndrome  THERAPY DIAG:  Cervicalgia  Abnormal posture  Muscle weakness (generalized)  Radiculopathy, cervicothoracic region  Pain in thoracic spine  Chronic right shoulder pain  Other lack of coordination  Rationale for Evaluation and Treatment: Rehabilitation  ONSET DATE: 3-4 months  SUBJECTIVE:                                                                                                                                                                                                         SUBJECTIVE STATEMENT: Pt states, "it's better." Pt reports the pain she has been feeling while in prayer is better. "It's gone now." Pt does report some pain this morning with it going down her arm.     From eval: Pt states she has been referred for her neck pain -- thinks it may be nerve related. Pt reports she feels severe neck pain. Pt states the neck pain goes down her shoulder and into both arms. Pt has just finished course of PT from [facility] on 05/30/22. Pt reports feeling intermittent N/T and dizziness. Pt feels most dizzy when she first wakes up from her sleep. Pt states she can do all of her housework but with pain.   Accompanied by: interpreter: Maurilio Lovely  dominance: Right  PERTINENT HISTORY:  Has had 2 courses of PT in the past  year  PAIN:  Are you having pain? Yes: NPRS scale: 7 Pain location: neck to bilat shoulders Pain description: very sharp Aggravating factors: any activities using her arms Relieving factors: Steroid injection  PRECAUTIONS: None  WEIGHT BEARING RESTRICTIONS: No  FALLS:  Has patient fallen in last 6 months? No  OCCUPATION: Mostly doing house work  PATIENT GOALS: Improve pain  NEXT MD VISIT: n/a  OBJECTIVE:  Note: Objective measures were completed at Evaluation unless otherwise noted.  DIAGNOSTIC FINDINGS:  Thoracic spine x-ray 04/09/22 (-) R shoulder x-ray 04/09/22 (-) No new imaging of her neck seen on Epic  PATIENT SURVEYS:  Neck Disability Index score: 18 / 50 = 36.0 %  SENSATION: N/T along mid neck/base of skull and can go along temples  POSTURE: forward head  PALPATION: Greatest tenderness in bilat cervical paraspinals and R>L UTs   CERVICAL ROM: a little dizziness with ext  Active ROM A/PROM (deg) eval  Flexion 35  Extension 30  Right lateral flexion 31 tightness on R  Left lateral flexion 38 tightness on R  Right rotation 38  Left rotation 35 pulls on R   (Blank rows = not tested)  UPPER EXTREMITY ROM:  Active ROM Right eval Left eval  Shoulder flexion    Shoulder extension    Shoulder abduction    Shoulder adduction    Shoulder extension    Shoulder internal rotation    Shoulder external rotation    Elbow flexion    Elbow extension    Wrist flexion    Wrist extension    Wrist ulnar deviation    Wrist radial deviation    Wrist pronation    Wrist supination     (Blank rows = not tested)  UPPER EXTREMITY MMT:  MMT Right eval Left eval  Shoulder flexion 5 5  Shoulder extension 3+ 4-  Shoulder abduction 4- 5  Shoulder adduction    Shoulder extension    Shoulder internal rotation 4- 5  Shoulder external rotation 3+ 5  Middle trapezius    Lower trapezius    Elbow flexion    Elbow extension    Wrist flexion    Wrist extension     Wrist ulnar deviation    Wrist radial deviation    Wrist pronation    Wrist supination    Grip strength     (Blank rows = not tested)  CERVICAL SPECIAL TESTS:  Cranial cervical flexion test: Positive and Spurling's test: Negative Unable to maintain cervical flexion at all Nerve tension test: Ulnar nerve (+) on R; Radial nerve (+) on R>L; Median nerve (+) on R  FUNCTIONAL TESTS:  Cervical flexion test: 1 sec before increased shoulder compensation and pt starts to use anterior neck muscles Chin tuck in standing pt is unable to perform without bilat shoulder elevation or pt just flexing c-spine  TREATMENT DATE:  06/20/23 UBE L1, 2 min fwd, 2 min bwd Seated thoracic extension 3x20" Seated Cervical flexion + lateral flexion x30" to open up intervertebral foramen with improved nerve symptoms Supine  Deep neck flexor 10x5"  Cervical retraction + scap retraction 10x5"  Cervical retraction + shoulder flex 1# x10, with WaTE bar 2# x10  Shoulder ER at 90 deg abd 1# 2x10  Horizontal abd 1# 2x10 Standing  Cervical retraction + scap retraction 10x5"   PATIENT EDUCATION:  Education details: continue HEP, added to HEP Person educated: Patient  Education method: Explanation, Demonstration, and Handouts Education comprehension: verbalized understanding, returned demonstration, and needs further education  HOME EXERCISE PROGRAM: Access Code: [email or web address] URL: [email or web address] Date: 06/13/2023 Prepared by: Vernon Prey April Kirstie Peri  Exercises - Seated Scapular Retraction  - 1 x daily - 7 x weekly - 2 sets - 10 reps - Supine Deep Neck Flexor Training  - 1 x daily - 7 x weekly - 2 sets - 10 reps - Supine Cervical Retraction with Towel  - 1 x daily - 7 x weekly - 2 sets - 10 reps - Gentle Levator Scapulae Stretch (Mirrored)  - 1 x daily - 7 x weekly - 2 sets - 30 sec hold - Standing Radial Nerve Glide  - 1 x daily - 7 x weekly - 2 sets - 10 reps - Ulnar Nerve Butterfly  - 1 x  daily - 7 x weekly - 1 sets - 10 reps - Supine Shoulder Horizontal Abduction with Resistance  - 1 x daily - 7 x weekly - 2 sets - 10 reps - Shoulder Flexion Wall Slide with Towel  - 1 x daily - 7 x weekly - 3 sets - 10 reps - Scaption Wall Slide with Towel  - 1 x daily - 7 x weekly - 3 sets - 10 reps   ASSESSMENT:  CLINICAL IMPRESSION: Continued to work on improving nerve mobility. Able to perform cervical retraction against gravity with less shoulder compensation today. Work on shoulder and postural strengthening in supine -- too much compensation against gravity. Decreased eccentric control noted.    From eval: Patient is a 48 y.o. F who was seen today for physical therapy evaluation and treatment for neck pain with radiating symptoms down bilat UEs. Assessment is significant for R>L cervical muscle tightness, ongoing R>L UE weakness, and tenderness and (+) nerve tension tests on R for ulnar, median and radial nerve. Pt continues to have very weak deep neck flexors and extensors -- unable to properly perform chin tuck without compensatory movements. Pt will highly benefit from PT to address these issues for improved comfort with home tasks.   OBJECTIVE IMPAIRMENTS: decreased activity tolerance, decreased coordination, decreased endurance, decreased mobility, decreased ROM, decreased strength, dizziness, increased fascial restrictions, increased muscle spasms, impaired flexibility, impaired sensation, impaired UE functional use, postural dysfunction, and pain.    GOALS: Goals reviewed with patient? Yes  SHORT TERM GOALS: Target date: 06/27/2023   Pt will be ind with initial HEP Baseline:  Goal status: INITIAL  2.  Pt will be able to maintain cervical flexion testing for >/=10 sec to demo improving neck stability Baseline:  Goal status: INITIAL   LONG TERM GOALS: Target date: 07/18/2023   Pt will be ind with management and progression of HEP Baseline:  Goal status: INITIAL  2.  Pt  will report no tightness or issues with nerve tension testing Baseline:  Goal status: INITIAL  3.  Pt will be able to maintain cervical flexion for >/=20 sec to demo increasing neck stability Baseline:  Goal status: INITIAL  4.  Pt will demo R = L UE strength for lifting and carrying tasks at home Baseline:  Goal status: INITIAL  5.  Pt will have improved NDI to </=26% to demo MCID Baseline:  Goal status: INITIAL     PLAN:  PT FREQUENCY: 2x/week  PT DURATION: 6 weeks  PLANNED INTERVENTIONS: [id number]- PT Re-evaluation, 97110-Therapeutic exercises, 97530- Therapeutic activity, 97112- Neuromuscular re-education, 97535- Self Care, [id number]- Manual therapy, [id number]- Aquatic Therapy, [id number]- Electrical  stimulation (unattended), 97035- Ultrasound, [id number]- Ionotophoresis 4mg /ml Dexamethasone, Patient/Family education, Taping, Dry Needling, Joint mobilization, Spinal mobilization, Cryotherapy, Moist heat, and Biofeedback  PLAN FOR NEXT SESSION: Assess response to HEP. Work on correctly engaging deep neck flexors and extensors. Nerve glides and UE strengthening as tolerated. Look for compensatory motions. Prefers private room. Assess dizziness with positional changes as needed  Farryn Linares April Ma L Taisha Pennebaker, PT 06/20/2023, 11:02 AM

## 2023-06-22 ENCOUNTER — Ambulatory Visit: Payer: Medicaid Other | Admitting: Physical Therapy

## 2023-06-22 DIAGNOSIS — M5413 Radiculopathy, cervicothoracic region: Secondary | ICD-10-CM | POA: Diagnosis not present

## 2023-06-22 DIAGNOSIS — M546 Pain in thoracic spine: Secondary | ICD-10-CM

## 2023-06-22 DIAGNOSIS — M542 Cervicalgia: Secondary | ICD-10-CM | POA: Diagnosis not present

## 2023-06-22 DIAGNOSIS — R278 Other lack of coordination: Secondary | ICD-10-CM

## 2023-06-22 DIAGNOSIS — G8929 Other chronic pain: Secondary | ICD-10-CM | POA: Diagnosis not present

## 2023-06-22 DIAGNOSIS — M25511 Pain in right shoulder: Secondary | ICD-10-CM | POA: Diagnosis not present

## 2023-06-22 DIAGNOSIS — M6281 Muscle weakness (generalized): Secondary | ICD-10-CM

## 2023-06-22 DIAGNOSIS — M5459 Other low back pain: Secondary | ICD-10-CM | POA: Diagnosis not present

## 2023-06-22 DIAGNOSIS — R293 Abnormal posture: Secondary | ICD-10-CM | POA: Diagnosis not present

## 2023-06-22 NOTE — Therapy (Addendum)
 OUTPATIENT PHYSICAL THERAPY TREATMENT   Patient Name: Betty Cain MRN: [id number] DOB:27-Jun-1975, 48 y.o., female Today's Date: 06/22/2023  END OF SESSION:  PT End of Session - 06/22/23 1016     Visit Number 6    Date for PT Re-Evaluation 07/18/23    Authorization Type [employer]    Authorization Time Period Auth request 06/22/23    Authorization - Visit Number 15    Authorization - Number of Visits 27    PT Start Time 1016    PT Stop Time 1055    PT Time Calculation (min) 39 min    Activity Tolerance Patient tolerated treatment well    Behavior During Therapy [facility] for tasks assessed/performed              Past Medical History:  Diagnosis Date   Allergy    Anemia    Anxiety 01/2019   Asthma    B12 deficiency    Back pain    Common migraine with intractable migraine 06/28/2017   Constipation    Diabetes (HCC) 02/2019   Fatigue    GERD (gastroesophageal reflux disease)    pos H pylori   Hypertension    controlled with medication   IBS (irritable bowel syndrome) 2005   Joint pain    Neonatal death    Vaginal delivery, full term-lived x2 hours.    Palpitations    Shortness of breath    Shortness of breath on exertion    Spinal headache    Swelling of both lower extremities    Valvular heart disease    Vitamin D deficiency 02/2019   Past Surgical History:  Procedure Laterality Date   ADENOIDECTOMY     and tonsils (as a child)   BIOPSY  08/27/2021   Procedure: BIOPSY;  Surgeon: Benancio Deeds, MD;  Location: [facility];  Service: Gastroenterology;;   boil  2003   right elbow   CESAREAN SECTION     x4   CESAREAN SECTION  06/02/2011   Procedure: CESAREAN SECTION;  Surgeon: Tilda Burrow, MD;  Location: [facility];  Service: Gynecology;  Laterality: N/A;  Primary Cesarean Section Delivery Baby Boy @ 0004, Apgars 9/9   CESAREAN SECTION N/A 12/10/2013   Procedure: REPEAT CESAREAN SECTION;  Surgeon: Catalina Antigua, MD;  Location: [facility];  Service:  Obstetrics;  Laterality: N/A;   CESAREAN SECTION     colonoscopy  11/23/2018   stated had issues with sleep when in for procedure   ESOPHAGOGASTRODUODENOSCOPY (EGD) WITH PROPOFOL N/A 08/27/2021   Procedure: ESOPHAGOGASTRODUODENOSCOPY (EGD) WITH PROPOFOL;  Surgeon: Benancio Deeds, MD;  Location: [facility];  Service: Gastroenterology;  Laterality: N/A;   OTHER SURGICAL HISTORY  2005   Uterine surgery    uterine cauterization     Patient Active Problem List   Diagnosis Date Noted   Dislocation of jaw 07/23/2022   Small vessel disease (HCC) 06/29/2022   Type 2 diabetes mellitus with obesity (HCC) 03/25/2022   Hypotension - iatrogenic 03/18/2022   Other chronic pain 02/02/2022   Dyspepsia    Early satiety    IDA (iron deficiency anemia) 01/29/2021   Paroxysmal supraventricular tachycardia (HCC) 10/01/2020   Class 1 obesity with serious comorbidity and body mass index (BMI) of 31.0 to 31.9 in adult 10/01/2020   Diabetes mellitus (HCC) 02/14/2020   Mixed diabetic hyperlipidemia associated with type 2 diabetes mellitus (HCC) 01/10/2020   Hypertension associated with diabetes (HCC) 01/10/2020   Diabetic neuropathy (HCC) 01/10/2020  Vitamin D deficiency 01/10/2020   Diabetes mellitus with proteinuria (HCC) 08/20/2019   History of Helicobacter pylori infection 08/08/2019   Proteinuria 06/14/2019   Elevated sed rate 05/23/2019   Elevated C-reactive protein (CRP) 05/23/2019   Diabetes mellitus without complication (HCC) 05/23/2019   Dyslipidemia (high LDL; low HDL) 05/23/2019   Type 2 diabetes mellitus with hyperglycemia, without long-term current use of insulin (HCC) 02/17/2019   Hemoglobin A1C between 7% and 9% indicating borderline diabetic control (HCC) 02/17/2019   Breast cancer screening by mammogram 02/17/2019   Frequent headaches 02/14/2019   Anxiety 02/14/2019   Common migraine with intractable migraine 06/28/2017   Chest pain at rest 10/17/2016   Sinus tachycardia  08/18/2016   Prolonged Q-T interval on ECG 08/18/2016   Vertigo 07/22/2015   Abdominal discomfort 02/14/2015   Low back pain 02/14/2015   Abdominal pain 02/01/2015   Nausea without vomiting 02/01/2015   Environmental allergies 02/01/2015   Language barrier to communication 02/01/2015   Anemia 01/23/2015   Constipation 01/21/2015   Knee pain, bilateral 01/21/2015   Palpitations 09/18/2013   Shortness of breath 09/18/2013   Advanced maternal age in pregnancy in second trimester 06/19/2013   IBS (irritable bowel syndrome) 04/13/2003    PCP: Ivonne Andrew, NP  REFERRING PROVIDER: Horton Chin, MD  REFERRING DIAG: M79.18 (ICD-10-CM) - Cervical myofascial pain syndrome  THERAPY DIAG:  Cervicalgia  Abnormal posture  Muscle weakness (generalized)  Radiculopathy, cervicothoracic region  Pain in thoracic spine  Chronic right shoulder pain  Other lack of coordination  Rationale for Evaluation and Treatment: Rehabilitation  ONSET DATE: 3-4 months  SUBJECTIVE:                                                                                                                                                                                                         SUBJECTIVE STATEMENT: Pt reports swelling in the throat and the top of the shoulders. Better in the shoulders. Pt reports some radicular pain down the R arm.    From eval: Pt states she has been referred for her neck pain -- thinks it may be nerve related. Pt reports she feels severe neck pain. Pt states the neck pain goes down her shoulder and into both arms. Pt has just finished course of PT from [facility] on 05/30/22. Pt reports feeling intermittent N/T and dizziness. Pt feels most dizzy when she first wakes up from her sleep. Pt states she can do all of her housework but with pain.   Accompanied by: interpreter: Enayat Hand dominance: Right  PERTINENT HISTORY:  Has had 2 courses of PT in the past  year  PAIN:  Are you having pain? Yes: NPRS scale: 7 Pain location: neck to bilat shoulders Pain description: very sharp Aggravating factors: any activities using her arms Relieving factors: Steroid injection  PRECAUTIONS: None  WEIGHT BEARING RESTRICTIONS: No  FALLS:  Has patient fallen in last 6 months? No  OCCUPATION: Mostly doing house work  PATIENT GOALS: Improve pain  NEXT MD VISIT: n/a  OBJECTIVE:  Note: Objective measures were completed at Evaluation unless otherwise noted.  DIAGNOSTIC FINDINGS:  Thoracic spine x-ray 04/09/22 (-) R shoulder x-ray 04/09/22 (-) No new imaging of her neck seen on Epic  PATIENT SURVEYS:  Neck Disability Index score: 18 / 50 = 36.0 %  SENSATION: N/T along mid neck/base of skull and can go along temples  POSTURE: forward head  PALPATION: Greatest tenderness in bilat cervical paraspinals and R>L UTs   CERVICAL ROM: a little dizziness with ext  Active ROM A/PROM (deg) eval  Flexion 35  Extension 30  Right lateral flexion 31 tightness on R  Left lateral flexion 38 tightness on R  Right rotation 38  Left rotation 35 pulls on R   (Blank rows = not tested)  UPPER EXTREMITY ROM:  Active ROM Right eval Left eval  Shoulder flexion    Shoulder extension    Shoulder abduction    Shoulder adduction    Shoulder extension    Shoulder internal rotation    Shoulder external rotation    Elbow flexion    Elbow extension    Wrist flexion    Wrist extension    Wrist ulnar deviation    Wrist radial deviation    Wrist pronation    Wrist supination     (Blank rows = not tested)  UPPER EXTREMITY MMT:  MMT Right eval Left eval  Shoulder flexion 5 5  Shoulder extension 3+ 4-  Shoulder abduction 4- 5  Shoulder adduction    Shoulder extension    Shoulder internal rotation 4- 5  Shoulder external rotation 3+ 5  Middle trapezius    Lower trapezius    Elbow flexion    Elbow extension    Wrist flexion    Wrist extension     Wrist ulnar deviation    Wrist radial deviation    Wrist pronation    Wrist supination    Grip strength     (Blank rows = not tested)  CERVICAL SPECIAL TESTS:  Cranial cervical flexion test: Positive and Spurling's test: Negative Unable to maintain cervical flexion at all Nerve tension test: Ulnar nerve (+) on R; Radial nerve (+) on R>L; Median nerve (+) on R  FUNCTIONAL TESTS:  Cervical flexion test: 1 sec before increased shoulder compensation and pt starts to use anterior neck muscles Chin tuck in standing pt is unable to perform without bilat shoulder elevation or pt just flexing c-spine  TREATMENT DATE:  06/22/23 Seated Cervical flexion + lateral flexion x30" to open up intervertebral foramen with improved nerve symptoms Seated thoracic extension 5x10" Seated cervical retraction with BP cuff for biofeedback x10 Seated cervical retraction with BP cuff for biofeedback + scap retraction 2x10 Wall plank + cervical retraction x10 Standing Cervical retraction + shoulder flexion wall slide attempted x10  Standing Cervical retraction against ball + shoulder flexion x10 Standing Cervical retraction against ball + shoulder abd x10 Supine shoulder flexion 1# x10 Supine shoulder ER at 90 deg shoulder abd 1# x10 Sidelying shoulder ER  1# x10  06/20/23 UBE L1, 2 min fwd, 2 min bwd Seated thoracic extension 3x20" Seated Cervical flexion + lateral flexion x30" to open up intervertebral foramen with improved nerve symptoms Supine  Deep neck flexor 10x5"  Cervical retraction + scap retraction 10x5"  Cervical retraction + shoulder flex 1# x10, with WaTE bar 2# x10  Shoulder ER at 90 deg abd 1# 2x10  Horizontal abd 1# 2x10 Standing  Cervical retraction + scap retraction 10x5"   PATIENT EDUCATION:  Education details: continue HEP, added to HEP Person educated: Patient Education method: Explanation, Demonstration, and Handouts Education comprehension: verbalized understanding, returned  demonstration, and needs further education  HOME EXERCISE PROGRAM: Access Code: [email or web address] URL: [email or web address] Date: 06/13/2023 Prepared by: Vernon Prey April Kirstie Peri  Exercises - Seated Scapular Retraction  - 1 x daily - 7 x weekly - 2 sets - 10 reps - Supine Deep Neck Flexor Training  - 1 x daily - 7 x weekly - 2 sets - 10 reps - Supine Cervical Retraction with Towel  - 1 x daily - 7 x weekly - 2 sets - 10 reps - Gentle Levator Scapulae Stretch (Mirrored)  - 1 x daily - 7 x weekly - 2 sets - 30 sec hold - Standing Radial Nerve Glide  - 1 x daily - 7 x weekly - 2 sets - 10 reps - Ulnar Nerve Butterfly  - 1 x daily - 7 x weekly - 1 sets - 10 reps - Supine Shoulder Horizontal Abduction with Resistance  - 1 x daily - 7 x weekly - 2 sets - 10 reps - Shoulder Flexion Wall Slide with Towel  - 1 x daily - 7 x weekly - 3 sets - 10 reps - Scaption Wall Slide with Towel  - 1 x daily - 7 x weekly - 3 sets - 10 reps   ASSESSMENT:  CLINICAL IMPRESSION: Treatment session focused primarily on improving deep neck stabilizing muscle endurance and strength against gravity. Able to perform with less upper trap compensation today but became very fatigued. Worked on overhead reaching while maintaining neck stability. Continued gross R shoulder strengthening. No radicular symptoms down her arm while maintaining neck stability. Overall improving neck strength and will benefit from continued PT to add more functional mobility while maintaining neck stability.   From eval: Patient is a 48 y.o. F who was seen today for physical therapy evaluation and treatment for neck pain with radiating symptoms down bilat UEs. Assessment is significant for R>L cervical muscle tightness, ongoing R>L UE weakness, and tenderness and (+) nerve tension tests on R for ulnar, median and radial nerve. Pt continues to have very weak deep neck flexors and extensors -- unable to properly perform chin tuck without  compensatory movements. Pt will highly benefit from PT to address these issues for improved comfort with home tasks.   OBJECTIVE IMPAIRMENTS: decreased activity tolerance, decreased coordination, decreased endurance, decreased mobility, decreased ROM, decreased strength, dizziness, increased fascial restrictions, increased muscle spasms, impaired flexibility, impaired sensation, impaired UE functional use, postural dysfunction, and pain.    GOALS: Goals reviewed with patient? Yes  SHORT TERM GOALS: Target date: 06/27/2023   Pt will be ind with initial HEP Baseline:  Goal status: MET  2.  Pt will be able to maintain cervical flexion testing for >/=10 sec to demo improving neck stability Baseline:  06/22/23: Able to perform against gravity in standing without UT compensation Goal status: IN PROGRESS   LONG TERM GOALS: Target date: 07/18/2023  Pt will be ind with management and progression of HEP Baseline:  Goal status: INITIAL  2.  Pt will report no tightness or issues with nerve tension testing Baseline:  Goal status: INITIAL  3.  Pt will be able to maintain cervical flexion for >/=20 sec to demo increasing neck stability Baseline:  Goal status: INITIAL  4.  Pt will demo R = L UE strength for lifting and carrying tasks at home Baseline:  Goal status: INITIAL  5.  Pt will have improved NDI to </=26% to demo MCID Baseline:  Goal status: INITIAL     PLAN:  PT FREQUENCY: 2x/week  PT DURATION: 6 weeks  PLANNED INTERVENTIONS: 97164- PT Re-evaluation, 97110-Therapeutic exercises, 97530- Therapeutic activity, 97112- Neuromuscular re-education, 97535- Self Care, 16109- Manual therapy, [id number]- Aquatic Therapy, [id number]- Electrical stimulation (unattended), 97035- Ultrasound, [id number]- Ionotophoresis 4mg /ml Dexamethasone, Patient/Family education, Taping, Dry Needling, Joint mobilization, Spinal mobilization, Cryotherapy, Moist heat, and Biofeedback  PLAN FOR NEXT SESSION: Assess  response to HEP. Work on correctly engaging deep neck flexors and extensors. Nerve glides and UE strengthening as tolerated. Look for compensatory motions. Prefers private room. Assess dizziness with positional changes as needed  Tyesha Joffe April Ma L Kadi Hession, PT 06/22/2023, 3:13 PM   For all possible CPT codes, reference the Planned Interventions line above.     Check all conditions that are expected to impact treatment: {Conditions expected to impact treatment:Musculoskeletal disorders   If treatment provided at initial evaluation, no treatment charged due to lack of authorization.

## 2023-06-24 ENCOUNTER — Other Ambulatory Visit: Payer: Self-pay | Admitting: Gastroenterology

## 2023-06-24 ENCOUNTER — Other Ambulatory Visit: Payer: Self-pay | Admitting: Nurse Practitioner

## 2023-06-27 ENCOUNTER — Other Ambulatory Visit: Payer: Self-pay

## 2023-06-27 ENCOUNTER — Ambulatory Visit: Payer: Medicaid Other | Admitting: Physical Therapy

## 2023-06-27 DIAGNOSIS — I739 Peripheral vascular disease, unspecified: Secondary | ICD-10-CM

## 2023-06-27 DIAGNOSIS — M5413 Radiculopathy, cervicothoracic region: Secondary | ICD-10-CM | POA: Diagnosis not present

## 2023-06-27 DIAGNOSIS — R293 Abnormal posture: Secondary | ICD-10-CM

## 2023-06-27 DIAGNOSIS — M6281 Muscle weakness (generalized): Secondary | ICD-10-CM | POA: Diagnosis not present

## 2023-06-27 DIAGNOSIS — G8929 Other chronic pain: Secondary | ICD-10-CM | POA: Diagnosis not present

## 2023-06-27 DIAGNOSIS — M542 Cervicalgia: Secondary | ICD-10-CM

## 2023-06-27 DIAGNOSIS — M5459 Other low back pain: Secondary | ICD-10-CM | POA: Diagnosis not present

## 2023-06-27 DIAGNOSIS — M546 Pain in thoracic spine: Secondary | ICD-10-CM

## 2023-06-27 DIAGNOSIS — M25511 Pain in right shoulder: Secondary | ICD-10-CM | POA: Diagnosis not present

## 2023-06-27 DIAGNOSIS — R278 Other lack of coordination: Secondary | ICD-10-CM | POA: Diagnosis not present

## 2023-06-27 NOTE — Therapy (Signed)
 OUTPATIENT PHYSICAL THERAPY TREATMENT   Patient Name: Betty Cain MRN: [id number] DOB:October 17, 1975, 48 y.o., female Today's Date: 06/27/2023  END OF SESSION:  PT End of Session - 06/27/23 1016     Visit Number 7    Number of Visits 13   with eval   Date for PT Re-Evaluation 07/18/23    Authorization Type [employer]    Authorization Time Period Auth request 06/22/23    Authorization - Number of Visits 27    PT Start Time 1015    PT Stop Time 1100    PT Time Calculation (min) 45 min    Activity Tolerance Patient limited by pain    Behavior During Therapy [facility] for tasks assessed/performed               Past Medical History:  Diagnosis Date   Allergy    Anemia    Anxiety 01/2019   Asthma    B12 deficiency    Back pain    Common migraine with intractable migraine 06/28/2017   Constipation    Diabetes (HCC) 02/2019   Fatigue    GERD (gastroesophageal reflux disease)    pos H pylori   Hypertension    controlled with medication   IBS (irritable bowel syndrome) 2005   Joint pain    Neonatal death    Vaginal delivery, full term-lived x2 hours.    Palpitations    Shortness of breath    Shortness of breath on exertion    Spinal headache    Swelling of both lower extremities    Valvular heart disease    Vitamin D deficiency 02/2019   Past Surgical History:  Procedure Laterality Date   ADENOIDECTOMY     and tonsils (as a child)   BIOPSY  08/27/2021   Procedure: BIOPSY;  Surgeon: Benancio Deeds, MD;  Location: [facility];  Service: Gastroenterology;;   boil  2003   right elbow   CESAREAN SECTION     x4   CESAREAN SECTION  06/02/2011   Procedure: CESAREAN SECTION;  Surgeon: Tilda Burrow, MD;  Location: [facility];  Service: Gynecology;  Laterality: N/A;  Primary Cesarean Section Delivery Baby Boy @ 0004, Apgars 9/9   CESAREAN SECTION N/A 12/10/2013   Procedure: REPEAT CESAREAN SECTION;  Surgeon: Catalina Antigua, MD;  Location: [facility];  Service: Obstetrics;   Laterality: N/A;   CESAREAN SECTION     colonoscopy  11/23/2018   stated had issues with sleep when in for procedure   ESOPHAGOGASTRODUODENOSCOPY (EGD) WITH PROPOFOL N/A 08/27/2021   Procedure: ESOPHAGOGASTRODUODENOSCOPY (EGD) WITH PROPOFOL;  Surgeon: Benancio Deeds, MD;  Location: [facility];  Service: Gastroenterology;  Laterality: N/A;   OTHER SURGICAL HISTORY  2005   Uterine surgery    uterine cauterization     Patient Active Problem List   Diagnosis Date Noted   Dislocation of jaw 07/23/2022   Small vessel disease (HCC) 06/29/2022   Type 2 diabetes mellitus with obesity (HCC) 03/25/2022   Hypotension - iatrogenic 03/18/2022   Other chronic pain 02/02/2022   Dyspepsia    Early satiety    IDA (iron deficiency anemia) 01/29/2021   Paroxysmal supraventricular tachycardia (HCC) 10/01/2020   Class 1 obesity with serious comorbidity and body mass index (BMI) of 31.0 to 31.9 in adult 10/01/2020   Diabetes mellitus (HCC) 02/14/2020   Mixed diabetic hyperlipidemia associated with type 2 diabetes mellitus (HCC) 01/10/2020   Hypertension associated with diabetes (HCC) 01/10/2020   Diabetic neuropathy (HCC)  01/10/2020   Vitamin D deficiency 01/10/2020   Diabetes mellitus with proteinuria (HCC) 08/20/2019   History of Helicobacter pylori infection 08/08/2019   Proteinuria 06/14/2019   Elevated sed rate 05/23/2019   Elevated C-reactive protein (CRP) 05/23/2019   Diabetes mellitus without complication (HCC) 05/23/2019   Dyslipidemia (high LDL; low HDL) 05/23/2019   Type 2 diabetes mellitus with hyperglycemia, without long-term current use of insulin (HCC) 02/17/2019   Hemoglobin A1C between 7% and 9% indicating borderline diabetic control (HCC) 02/17/2019   Breast cancer screening by mammogram 02/17/2019   Frequent headaches 02/14/2019   Anxiety 02/14/2019   Common migraine with intractable migraine 06/28/2017   Chest pain at rest 10/17/2016   Sinus tachycardia 08/18/2016    Prolonged Q-T interval on ECG 08/18/2016   Vertigo 07/22/2015   Abdominal discomfort 02/14/2015   Low back pain 02/14/2015   Abdominal pain 02/01/2015   Nausea without vomiting 02/01/2015   Environmental allergies 02/01/2015   Language barrier to communication 02/01/2015   Anemia 01/23/2015   Constipation 01/21/2015   Knee pain, bilateral 01/21/2015   Palpitations 09/18/2013   Shortness of breath 09/18/2013   Advanced maternal age in pregnancy in second trimester 06/19/2013   IBS (irritable bowel syndrome) 04/13/2003    PCP: Ivonne Andrew, NP  REFERRING PROVIDER: Horton Chin, MD  REFERRING DIAG: M79.18 (ICD-10-CM) - Cervical myofascial pain syndrome  THERAPY DIAG:  Cervicalgia  Abnormal posture  Muscle weakness (generalized)  Radiculopathy, cervicothoracic region  Pain in thoracic spine  Rationale for Evaluation and Treatment: Rehabilitation  ONSET DATE: 3-4 months  SUBJECTIVE:                                                                                                                                                                                                         SUBJECTIVE STATEMENT: Pt reports that overall her pain is improved but when she does have the pain it is severe. Pt reports that she feels the pain when she works hard or lays down. Pt reports if she tries to lay down on her R side she gets numbness all the way down into her hand, she also has ongoing pain in her neck, shoulders, back, legs...  Pt also reports ongoing headaches a few times per week. Pt reports that initially she felt good after last session but then had back pain 2 days later.   From eval: Pt states she has been referred for her neck pain -- thinks it may be nerve related. Pt reports she feels severe neck pain. Pt states the neck  pain goes down her shoulder and into both arms. Pt has just finished course of PT from [facility] on 05/30/22. Pt reports feeling intermittent  N/T and dizziness. Pt feels most dizzy when she first wakes up from her sleep. Pt states she can do all of her housework but with pain.   Accompanied by: interpreter: Enayat Hand dominance: Right  PERTINENT HISTORY:  Has had 2 courses of PT in the past year  PAIN:  Are you having pain? Yes: NPRS scale: 6 Pain location: neck to bilat shoulders Pain description: very sharp Aggravating factors: any activities using her arms Relieving factors: Steroid injection  PRECAUTIONS: None  WEIGHT BEARING RESTRICTIONS: No  FALLS:  Has patient fallen in last 6 months? No  OCCUPATION: Mostly doing house work  PATIENT GOALS: Improve pain  NEXT MD VISIT: n/a  OBJECTIVE:  Note: Objective measures were completed at Evaluation unless otherwise noted.  DIAGNOSTIC FINDINGS:  Thoracic spine x-ray 04/09/22 (-) R shoulder x-ray 04/09/22 (-) No new imaging of her neck seen on Epic  PATIENT SURVEYS:  Neck Disability Index score: 18 / 50 = 36.0 %  SENSATION: N/T along mid neck/base of skull and can go along temples  POSTURE: forward head  PALPATION: Greatest tenderness in bilat cervical paraspinals and R>L UTs   CERVICAL ROM: a little dizziness with ext  Active ROM A/PROM (deg) eval  Flexion 35  Extension 30  Right lateral flexion 31 tightness on R  Left lateral flexion 38 tightness on R  Right rotation 38  Left rotation 35 pulls on R   (Blank rows = not tested)  UPPER EXTREMITY ROM:  Active ROM Right eval Left eval  Shoulder flexion    Shoulder extension    Shoulder abduction    Shoulder adduction    Shoulder extension    Shoulder internal rotation    Shoulder external rotation    Elbow flexion    Elbow extension    Wrist flexion    Wrist extension    Wrist ulnar deviation    Wrist radial deviation    Wrist pronation    Wrist supination     (Blank rows = not tested)  UPPER EXTREMITY MMT:  MMT Right eval Left eval  Shoulder flexion 5 5  Shoulder extension 3+  4-  Shoulder abduction 4- 5  Shoulder adduction    Shoulder extension    Shoulder internal rotation 4- 5  Shoulder external rotation 3+ 5  Middle trapezius    Lower trapezius    Elbow flexion    Elbow extension    Wrist flexion    Wrist extension    Wrist ulnar deviation    Wrist radial deviation    Wrist pronation    Wrist supination    Grip strength     (Blank rows = not tested)  CERVICAL SPECIAL TESTS:  Cranial cervical flexion test: Positive and Spurling's test: Negative Unable to maintain cervical flexion at all Nerve tension test: Ulnar nerve (+) on R; Radial nerve (+) on R>L; Median nerve (+) on R  FUNCTIONAL TESTS:  Cervical flexion test: 1 sec before increased shoulder compensation and pt starts to use anterior neck muscles Chin tuck in standing pt is unable to perform without bilat shoulder elevation or pt just flexing c-spine  TREATMENT DATE:  Manual Therapy Cervical traction to address symptoms down R arm, pt has onset of pain in front of neck with traction Suboccipital release, initially has headache but then it resolves L lateral cervical  SB + overpressure at shoulder, initially tolerates with mild pain but then pain increases  TherEx Supine pec stretch x 5 reps Supine suboccipital release on tennis ball x 5 min, does not feel much pressure Transitioned to supine on Chirp Wheel XR x 5 min, pressure feels more intense but has increase in headache Removed Chirp Wheel and headache improves after a few min  Supine thoracic mobilization over towel roll: shoulder abduction x 10 reps shoulder flexion x 10 reps  Seated shoulder flexion x 10 reps Seated shoulder abduction x 10 reps Pt tends to keep her head forwards, needs cues for cervical retraction   Gets dizzy when she sits up, reports she also gets dizzy if she turns her head too fast or she bends over to tie her shoe   PATIENT EDUCATION:  Education details: continue HEP Person educated:  Patient Education method: Explanation, Demonstration, Tactile cues, and Verbal cues Education comprehension: verbalized understanding, returned demonstration, and needs further education  HOME EXERCISE PROGRAM: Access Code: [email or web address] URL: [email or web address] Date: 06/13/2023 Prepared by: Vernon Prey April Kirstie Peri  Exercises - Seated Scapular Retraction  - 1 x daily - 7 x weekly - 2 sets - 10 reps - Supine Deep Neck Flexor Training  - 1 x daily - 7 x weekly - 2 sets - 10 reps - Supine Cervical Retraction with Towel  - 1 x daily - 7 x weekly - 2 sets - 10 reps - Gentle Levator Scapulae Stretch (Mirrored)  - 1 x daily - 7 x weekly - 2 sets - 30 sec hold - Standing Radial Nerve Glide  - 1 x daily - 7 x weekly - 2 sets - 10 reps - Ulnar Nerve Butterfly  - 1 x daily - 7 x weekly - 1 sets - 10 reps - Supine Shoulder Horizontal Abduction with Resistance  - 1 x daily - 7 x weekly - 2 sets - 10 reps - Shoulder Flexion Wall Slide with Towel  - 1 x daily - 7 x weekly - 3 sets - 10 reps - Scaption Wall Slide with Towel  - 1 x daily - 7 x weekly - 3 sets - 10 reps   ASSESSMENT:  CLINICAL IMPRESSION: Emphasis of skilled PT session on attempting to address ongoing cervical and shoulder pain, RUE cervical radiculopathy, and ongoing headaches. Pt with increase in pain with various manual therapies attempted so deferred further treatment in that regard. Pt also with ongoing intermittent increase in her pain with various UE and cervical ROM and stretches as well as continuing to exhibit poor body mechanics with exercises. Pt continues to benefit from skilled PT services in attempts to address her ongoing pain symptoms and improve her body mechanics and posture in order to reduce her pain. Continue POC.    From eval: Patient is a 48 y.o. F who was seen today for physical therapy evaluation and treatment for neck pain with radiating symptoms down bilat UEs. Assessment is significant for R>L  cervical muscle tightness, ongoing R>L UE weakness, and tenderness and (+) nerve tension tests on R for ulnar, median and radial nerve. Pt continues to have very weak deep neck flexors and extensors -- unable to properly perform chin tuck without compensatory movements. Pt will highly benefit from PT to address these issues for improved comfort with home tasks.   OBJECTIVE IMPAIRMENTS: decreased activity tolerance, decreased coordination, decreased endurance, decreased mobility, decreased ROM, decreased strength, dizziness, increased fascial restrictions, increased muscle spasms, impaired flexibility, impaired sensation, impaired UE  functional use, postural dysfunction, and pain.    GOALS: Goals reviewed with patient? Yes  SHORT TERM GOALS: Target date: 06/27/2023   Pt will be ind with initial HEP Baseline:  Goal status: MET  2.  Pt will be able to maintain cervical flexion testing for >/=10 sec to demo improving neck stability Baseline:  06/22/23: Able to perform against gravity in standing without UT compensation Goal status: IN PROGRESS   LONG TERM GOALS: Target date: 07/18/2023   Pt will be ind with management and progression of HEP Baseline:  Goal status: INITIAL  2.  Pt will report no tightness or issues with nerve tension testing Baseline:  Goal status: INITIAL  3.  Pt will be able to maintain cervical flexion for >/=20 sec to demo increasing neck stability Baseline:  Goal status: INITIAL  4.  Pt will demo R = L UE strength for lifting and carrying tasks at home Baseline:  Goal status: INITIAL  5.  Pt will have improved NDI to </=26% to demo MCID Baseline:  Goal status: INITIAL     PLAN:  PT FREQUENCY: 2x/week  PT DURATION: 6 weeks  PLANNED INTERVENTIONS: 97164- PT Re-evaluation, 97110-Therapeutic exercises, 97530- Therapeutic activity, 97112- Neuromuscular re-education, 97535- Self Care, 28413- Manual therapy, [id number]- Aquatic Therapy, [id number]- Electrical  stimulation (unattended), 97035- Ultrasound, 24401- Ionotophoresis 4mg /ml Dexamethasone, Patient/Family education, Taping, Dry Needling, Joint mobilization, Spinal mobilization, Cryotherapy, Moist heat, and Biofeedback  PLAN FOR NEXT SESSION: Assess response to HEP. Work on correctly engaging deep neck flexors and extensors. Nerve glides and UE strengthening as tolerated. Look for compensatory motions. Prefers private room. Assess dizziness with positional changes as needed-gets dizzy every time she sits up  Peter Congo, PT Peter Congo, PT, DPT, CSRS  06/27/2023, 12:50 PM   For all possible CPT codes, reference the Planned Interventions line above.     Check all conditions that are expected to impact treatment: {Conditions expected to impact treatment:Musculoskeletal disorders   If treatment provided at initial evaluation, no treatment charged due to lack of authorization.

## 2023-06-29 ENCOUNTER — Ambulatory Visit: Payer: Medicaid Other | Admitting: Physical Therapy

## 2023-06-29 DIAGNOSIS — M5413 Radiculopathy, cervicothoracic region: Secondary | ICD-10-CM | POA: Diagnosis not present

## 2023-06-29 DIAGNOSIS — M6281 Muscle weakness (generalized): Secondary | ICD-10-CM | POA: Diagnosis not present

## 2023-06-29 DIAGNOSIS — R278 Other lack of coordination: Secondary | ICD-10-CM

## 2023-06-29 DIAGNOSIS — M542 Cervicalgia: Secondary | ICD-10-CM

## 2023-06-29 DIAGNOSIS — R293 Abnormal posture: Secondary | ICD-10-CM

## 2023-06-29 DIAGNOSIS — M546 Pain in thoracic spine: Secondary | ICD-10-CM | POA: Diagnosis not present

## 2023-06-29 DIAGNOSIS — G8929 Other chronic pain: Secondary | ICD-10-CM

## 2023-06-29 DIAGNOSIS — M25511 Pain in right shoulder: Secondary | ICD-10-CM | POA: Diagnosis not present

## 2023-06-29 DIAGNOSIS — M5459 Other low back pain: Secondary | ICD-10-CM | POA: Diagnosis not present

## 2023-06-29 NOTE — Therapy (Signed)
 OUTPATIENT PHYSICAL THERAPY TREATMENT   Patient Name: Betty Cain MRN: [id number] DOB:[birth year], 48 y.o., female Today's Date: 06/29/2023  END OF SESSION:  PT End of Session - 06/29/23 1006     Visit Number 8    Number of Visits 13   with eval   Date for PT Re-Evaluation 07/18/23    Authorization Type [employer]    Authorization Time Period Auth request 06/22/23    Authorization - Number of Visits 27    PT Start Time 1010    PT Stop Time 1055    PT Time Calculation (min) 45 min    Activity Tolerance Patient limited by pain    Behavior During Therapy [facility] for tasks assessed/performed             Past Medical History:  Diagnosis Date   Allergy    Anemia    Anxiety 01/2019   Asthma    B12 deficiency    Back pain    Common migraine with intractable migraine 06/28/2017   Constipation    Diabetes (HCC) 02/2019   Fatigue    GERD (gastroesophageal reflux disease)    pos H pylori   Hypertension    controlled with medication   IBS (irritable bowel syndrome) 2005   Joint pain    Neonatal death    Vaginal delivery, full term-lived x2 hours.    Palpitations    Shortness of breath    Shortness of breath on exertion    Spinal headache    Swelling of both lower extremities    Valvular heart disease    Vitamin D deficiency 02/2019   Past Surgical History:  Procedure Laterality Date   ADENOIDECTOMY     and tonsils (as a child)   BIOPSY  08/27/2021   Procedure: BIOPSY;  Surgeon: Benancio Deeds, MD;  Location: [facility];  Service: Gastroenterology;;   boil  2003   right elbow   CESAREAN SECTION     x4   CESAREAN SECTION  06/02/2011   Procedure: CESAREAN SECTION;  Surgeon: Tilda Burrow, MD;  Location: [facility];  Service: Gynecology;  Laterality: N/A;  Primary Cesarean Section Delivery Baby Boy @ 0004, Apgars 9/9   CESAREAN SECTION N/A 12/10/2013   Procedure: REPEAT CESAREAN SECTION;  Surgeon: Catalina Antigua, MD;  Location: [facility];  Service: Obstetrics;   Laterality: N/A;   CESAREAN SECTION     colonoscopy  11/23/2018   stated had issues with sleep when in for procedure   ESOPHAGOGASTRODUODENOSCOPY (EGD) WITH PROPOFOL N/A 08/27/2021   Procedure: ESOPHAGOGASTRODUODENOSCOPY (EGD) WITH PROPOFOL;  Surgeon: Benancio Deeds, MD;  Location: [facility];  Service: Gastroenterology;  Laterality: N/A;   OTHER SURGICAL HISTORY  2005   Uterine surgery    uterine cauterization     Patient Active Problem List   Diagnosis Date Noted   Dislocation of jaw 07/23/2022   Small vessel disease (HCC) 06/29/2022   Type 2 diabetes mellitus with obesity (HCC) 03/25/2022   Hypotension - iatrogenic 03/18/2022   Other chronic pain 02/02/2022   Dyspepsia    Early satiety    IDA (iron deficiency anemia) 01/29/2021   Paroxysmal supraventricular tachycardia (HCC) 10/01/2020   Class 1 obesity with serious comorbidity and body mass index (BMI) of 31.0 to 31.9 in adult 10/01/2020   Diabetes mellitus (HCC) 02/14/2020   Mixed diabetic hyperlipidemia associated with type 2 diabetes mellitus (HCC) 01/10/2020   Hypertension associated with diabetes (HCC) 01/10/2020   Diabetic neuropathy (HCC) 01/10/2020  Vitamin D deficiency 01/10/2020   Diabetes mellitus with proteinuria (HCC) 08/20/2019   History of Helicobacter pylori infection 08/08/2019   Proteinuria 06/14/2019   Elevated sed rate 05/23/2019   Elevated C-reactive protein (CRP) 05/23/2019   Diabetes mellitus without complication (HCC) 05/23/2019   Dyslipidemia (high LDL; low HDL) 05/23/2019   Type 2 diabetes mellitus with hyperglycemia, without long-term current use of insulin (HCC) 02/17/2019   Hemoglobin A1C between 7% and 9% indicating borderline diabetic control (HCC) 02/17/2019   Breast cancer screening by mammogram 02/17/2019   Frequent headaches 02/14/2019   Anxiety 02/14/2019   Common migraine with intractable migraine 06/28/2017   Chest pain at rest 10/17/2016   Sinus tachycardia 08/18/2016    Prolonged Q-T interval on ECG 08/18/2016   Vertigo 07/22/2015   Abdominal discomfort 02/14/2015   Low back pain 02/14/2015   Abdominal pain 02/01/2015   Nausea without vomiting 02/01/2015   Environmental allergies 02/01/2015   Language barrier to communication 02/01/2015   Anemia 01/23/2015   Constipation 01/21/2015   Knee pain, bilateral 01/21/2015   Palpitations 09/18/2013   Shortness of breath 09/18/2013   Advanced maternal age in pregnancy in second trimester 06/19/2013   IBS (irritable bowel syndrome) 04/13/2003    PCP: Ivonne Andrew, NP  REFERRING PROVIDER: Horton Chin, MD  REFERRING DIAG: M79.18 (ICD-10-CM) - Cervical myofascial pain syndrome  THERAPY DIAG:  Cervicalgia  Abnormal posture  Muscle weakness (generalized)  Radiculopathy, cervicothoracic region  Pain in thoracic spine  Chronic right shoulder pain  Other lack of coordination  Rationale for Evaluation and Treatment: Rehabilitation  ONSET DATE: 3-4 months  SUBJECTIVE:                                                                                                                                                                                                         SUBJECTIVE STATEMENT: Pt states she gets headaches that come and ago. One hour before coming here she was getting numbness in the left hand. Pt states burned her R hand on the oven. Pt states she has been trying to work on her body mechanics at home but does feel that her shoulders come up.    From eval: Pt states she has been referred for her neck pain -- thinks it may be nerve related. Pt reports she feels severe neck pain. Pt states the neck pain goes down her shoulder and into both arms. Pt has just finished course of PT from [facility] on 05/30/22. Pt reports feeling intermittent N/T and dizziness. Pt feels most dizzy when  she first wakes up from her sleep. Pt states she can do all of her housework but with pain.    Accompanied by: interpreter: Enayat Hand dominance: Right  PERTINENT HISTORY:  Has had 2 courses of PT in the past year  PAIN:  Are you having pain? Yes: NPRS scale: 6 Pain location: neck to bilat shoulders Pain description: very sharp Aggravating factors: any activities using her arms Relieving factors: Steroid injection  PRECAUTIONS: None  WEIGHT BEARING RESTRICTIONS: No  FALLS:  Has patient fallen in last 6 months? No  OCCUPATION: Mostly doing house work  PATIENT GOALS: Improve pain  NEXT MD VISIT: n/a  OBJECTIVE:  Note: Objective measures were completed at Evaluation unless otherwise noted.  DIAGNOSTIC FINDINGS:  Thoracic spine x-ray 04/09/22 (-) R shoulder x-ray 04/09/22 (-) No new imaging of her neck seen on Epic  PATIENT SURVEYS:  Neck Disability Index score: 18 / 50 = 36.0 %  SENSATION: N/T along mid neck/base of skull and can go along temples  POSTURE: forward head  PALPATION: Greatest tenderness in bilat cervical paraspinals and R>L UTs   CERVICAL ROM: a little dizziness with ext  Active ROM A/PROM (deg) eval  Flexion 35  Extension 30  Right lateral flexion 31 tightness on R  Left lateral flexion 38 tightness on R  Right rotation 38  Left rotation 35 pulls on R   (Blank rows = not tested)  UPPER EXTREMITY ROM:  Active ROM Right eval Left eval  Shoulder flexion    Shoulder extension    Shoulder abduction    Shoulder adduction    Shoulder extension    Shoulder internal rotation    Shoulder external rotation    Elbow flexion    Elbow extension    Wrist flexion    Wrist extension    Wrist ulnar deviation    Wrist radial deviation    Wrist pronation    Wrist supination     (Blank rows = not tested)  UPPER EXTREMITY MMT:  MMT Right eval Left eval  Shoulder flexion 5 5  Shoulder extension 3+ 4-  Shoulder abduction 4- 5  Shoulder adduction    Shoulder extension    Shoulder internal rotation 4- 5  Shoulder external  rotation 3+ 5  Middle trapezius    Lower trapezius    Elbow flexion    Elbow extension    Wrist flexion    Wrist extension    Wrist ulnar deviation    Wrist radial deviation    Wrist pronation    Wrist supination    Grip strength     (Blank rows = not tested)  CERVICAL SPECIAL TESTS:  Cranial cervical flexion test: Positive and Spurling's test: Negative Unable to maintain cervical flexion at all Nerve tension test: Ulnar nerve (+) on R; Radial nerve (+) on R>L; Median nerve (+) on R  FUNCTIONAL TESTS:  Cervical flexion test: 1 sec before increased shoulder compensation and pt starts to use anterior neck muscles Chin tuck in standing pt is unable to perform without bilat shoulder elevation or pt just flexing c-spine  TREATMENT DATE:  Seated Cervical flexion + lateral flexion 2x30" to open up intervertebral foramen with improved nerve symptoms Seated thoracic extension against ball 5x10" Sidelying open book 5x10" Standing cervical retraction against ball x10 Standing scap squeeze against ball x10 Standing cervical retraction + scap squeeze x10 Wall plank + cervical retraction x5, + rotation 2x5 Forearm on wall serratus anterior activation + scap retraction x10 Standing cervical  retraction + 6 cones into first cabinet shelf using R&L UE Sidelying shoulder ER 2# x10 Sidelying shoulder abd to 90 deg 2# x8 Supine shoulder flexion 2# x10    PATIENT EDUCATION:  Education details: continue HEP Person educated: Patient Education method: Explanation, Demonstration, Tactile cues, and Verbal cues Education comprehension: verbalized understanding, returned demonstration, and needs further education  HOME EXERCISE PROGRAM: Access Code: [email or web address] URL: [email or web address] Date: 06/13/2023 Prepared by: Vernon Prey April Kirstie Peri  Exercises - Seated Scapular Retraction  - 1 x daily - 7 x weekly - 2 sets - 10 reps - Supine Deep Neck Flexor Training  - 1 x daily - 7 x  weekly - 2 sets - 10 reps - Supine Cervical Retraction with Towel  - 1 x daily - 7 x weekly - 2 sets - 10 reps - Gentle Levator Scapulae Stretch (Mirrored)  - 1 x daily - 7 x weekly - 2 sets - 30 sec hold - Standing Radial Nerve Glide  - 1 x daily - 7 x weekly - 2 sets - 10 reps - Ulnar Nerve Butterfly  - 1 x daily - 7 x weekly - 1 sets - 10 reps - Supine Shoulder Horizontal Abduction with Resistance  - 1 x daily - 7 x weekly - 2 sets - 10 reps - Shoulder Flexion Wall Slide with Towel  - 1 x daily - 7 x weekly - 3 sets - 10 reps - Scaption Wall Slide with Towel  - 1 x daily - 7 x weekly - 3 sets - 10 reps   ASSESSMENT:  CLINICAL IMPRESSION: Pt able to tolerate progression to 2 lbs demonstrating increasing shoulder strength. Able to progress ulnar and median nerve glides. Radial nerve is no longer bothering patient. Some left UE N/T today which resolved after stretching. Continued to work on maintaining cervical stability with arms elevated for kitchen/home tasks. Will lose her form without verbal cues but is requiring less cueing overall. Improving endurance noted -- able to put up and take down 6 cones without have to rest.     From eval: Patient is a 48 y.o. F who was seen today for physical therapy evaluation and treatment for neck pain with radiating symptoms down bilat UEs. Assessment is significant for R>L cervical muscle tightness, ongoing R>L UE weakness, and tenderness and (+) nerve tension tests on R for ulnar, median and radial nerve. Pt continues to have very weak deep neck flexors and extensors -- unable to properly perform chin tuck without compensatory movements. Pt will highly benefit from PT to address these issues for improved comfort with home tasks.   OBJECTIVE IMPAIRMENTS: decreased activity tolerance, decreased coordination, decreased endurance, decreased mobility, decreased ROM, decreased strength, dizziness, increased fascial restrictions, increased muscle spasms, impaired  flexibility, impaired sensation, impaired UE functional use, postural dysfunction, and pain.    GOALS: Goals reviewed with patient? Yes  SHORT TERM GOALS: Target date: 06/27/2023   Pt will be ind with initial HEP Baseline:  Goal status: MET  2.  Pt will be able to maintain cervical flexion testing for >/=10 sec to demo improving neck stability Baseline:  06/22/23: Able to perform against gravity in standing without UT compensation Goal status: IN PROGRESS   LONG TERM GOALS: Target date: 07/18/2023   Pt will be ind with management and progression of HEP Baseline:  Goal status: INITIAL  2.  Pt will report no tightness or issues with nerve tension testing Baseline:  Goal status: INITIAL  3.  Pt  will be able to maintain cervical flexion for >/=20 sec to demo increasing neck stability Baseline:  Goal status: INITIAL  4.  Pt will demo R = L UE strength for lifting and carrying tasks at home Baseline:  Goal status: INITIAL  5.  Pt will have improved NDI to </=26% to demo MCID Baseline:  Goal status: INITIAL     PLAN:  PT FREQUENCY: 2x/week  PT DURATION: 6 weeks  PLANNED INTERVENTIONS: 97164- PT Re-evaluation, 97110-Therapeutic exercises, 97530- Therapeutic activity, 97112- Neuromuscular re-education, 97535- Self Care, 16109- Manual therapy, [id number]- Aquatic Therapy, [id number]- Electrical stimulation (unattended), 97035- Ultrasound, 09811- Ionotophoresis 4mg /ml Dexamethasone, Patient/Family education, Taping, Dry Needling, Joint mobilization, Spinal mobilization, Cryotherapy, Moist heat, and Biofeedback  PLAN FOR NEXT SESSION: Assess response to HEP. Work on correctly engaging deep neck flexors and extensors. Nerve glides and UE strengthening as tolerated. Look for compensatory motions. Prefers private room. Assess dizziness with positional changes as needed-gets dizzy every time she sits up  [facility] April Ma L Yomara Toothman, PT 06/29/2023, 11:11 AM   For all possible CPT codes,  reference the Planned Interventions line above.     Check all conditions that are expected to impact treatment: {Conditions expected to impact treatment:Musculoskeletal disorders   If treatment provided at initial evaluation, no treatment charged due to lack of authorization.

## 2023-07-04 ENCOUNTER — Ambulatory Visit: Payer: Medicaid Other | Admitting: Physical Therapy

## 2023-07-04 DIAGNOSIS — R278 Other lack of coordination: Secondary | ICD-10-CM | POA: Diagnosis not present

## 2023-07-04 DIAGNOSIS — M5413 Radiculopathy, cervicothoracic region: Secondary | ICD-10-CM

## 2023-07-04 DIAGNOSIS — M5459 Other low back pain: Secondary | ICD-10-CM

## 2023-07-04 DIAGNOSIS — M542 Cervicalgia: Secondary | ICD-10-CM

## 2023-07-04 DIAGNOSIS — R293 Abnormal posture: Secondary | ICD-10-CM

## 2023-07-04 DIAGNOSIS — M6281 Muscle weakness (generalized): Secondary | ICD-10-CM

## 2023-07-04 DIAGNOSIS — M546 Pain in thoracic spine: Secondary | ICD-10-CM | POA: Diagnosis not present

## 2023-07-04 DIAGNOSIS — M25511 Pain in right shoulder: Secondary | ICD-10-CM | POA: Diagnosis not present

## 2023-07-04 DIAGNOSIS — G8929 Other chronic pain: Secondary | ICD-10-CM | POA: Diagnosis not present

## 2023-07-04 NOTE — Therapy (Signed)
 OUTPATIENT PHYSICAL THERAPY TREATMENT - DISCHARGE NOTE   Patient Name: Betty Cain MRN: [id number] DOB:[birth year], 48 y.o., female Today's Date: 07/04/2023  PHYSICAL THERAPY DISCHARGE SUMMARY  Visits from Start of Care: 9  Current functional level related to goals / functional outcomes: Independent   Remaining deficits: Ongoing pain and limited ROM in cervical and lumbar regions, impaired function and postural abnormalities   Education / Equipment: Final handout for HEP and pro bono clinics   Patient agrees to discharge. Patient goals were partially met. Patient is being discharged due to  insurance denying coverage for PT services.   END OF SESSION:  PT End of Session - 07/04/23 1017     Visit Number 9    Number of Visits 13   with eval   Date for PT Re-Evaluation 07/18/23    Authorization Type [employer]    Authorization Time Period Auth request 06/22/23    Authorization - Number of Visits 27    PT Start Time 1015    Activity Tolerance Patient limited by pain    Behavior During Therapy [facility] for tasks assessed/performed              Past Medical History:  Diagnosis Date   Allergy    Anemia    Anxiety 01/2019   Asthma    B12 deficiency    Back pain    Common migraine with intractable migraine 06/28/2017   Constipation    Diabetes (HCC) 02/2019   Fatigue    GERD (gastroesophageal reflux disease)    pos H pylori   Hypertension    controlled with medication   IBS (irritable bowel syndrome) 2005   Joint pain    Neonatal death    Vaginal delivery, full term-lived x2 hours.    Palpitations    Shortness of breath    Shortness of breath on exertion    Spinal headache    Swelling of both lower extremities    Valvular heart disease    Vitamin D deficiency 02/2019   Past Surgical History:  Procedure Laterality Date   ADENOIDECTOMY     and tonsils (as a child)   BIOPSY  08/27/2021   Procedure: BIOPSY;  Surgeon: Benancio Deeds, MD;  Location: [facility]  ENDOSCOPY;  Service: Gastroenterology;;   boil  2003   right elbow   CESAREAN SECTION     x4   CESAREAN SECTION  06/02/2011   Procedure: CESAREAN SECTION;  Surgeon: Tilda Burrow, MD;  Location: [facility] ORS;  Service: Gynecology;  Laterality: N/A;  Primary Cesarean Section Delivery Baby Boy @ 0004, Apgars 9/9   CESAREAN SECTION N/A 12/10/2013   Procedure: REPEAT CESAREAN SECTION;  Surgeon: Catalina Antigua, MD;  Location: [facility] ORS;  Service: Obstetrics;  Laterality: N/A;   CESAREAN SECTION     colonoscopy  11/23/2018   stated had issues with sleep when in for procedure   ESOPHAGOGASTRODUODENOSCOPY (EGD) WITH PROPOFOL N/A 08/27/2021   Procedure: ESOPHAGOGASTRODUODENOSCOPY (EGD) WITH PROPOFOL;  Surgeon: Benancio Deeds, MD;  Location: [facility] ENDOSCOPY;  Service: Gastroenterology;  Laterality: N/A;   OTHER SURGICAL HISTORY  2005   Uterine surgery    uterine cauterization     Patient Active Problem List   Diagnosis Date Noted   Dislocation of jaw 07/23/2022   Small vessel disease (HCC) 06/29/2022   Type 2 diabetes mellitus with obesity (HCC) 03/25/2022   Hypotension - iatrogenic 03/18/2022   Other chronic pain 02/02/2022   Dyspepsia    Early satiety  IDA (iron deficiency anemia) 01/29/2021   Paroxysmal supraventricular tachycardia (HCC) 10/01/2020   Class 1 obesity with serious comorbidity and body mass index (BMI) of 31.0 to 31.9 in adult 10/01/2020   Diabetes mellitus (HCC) 02/14/2020   Mixed diabetic hyperlipidemia associated with type 2 diabetes mellitus (HCC) 01/10/2020   Hypertension associated with diabetes (HCC) 01/10/2020   Diabetic neuropathy (HCC) 01/10/2020   Vitamin D deficiency 01/10/2020   Diabetes mellitus with proteinuria (HCC) 08/20/2019   History of Helicobacter pylori infection 08/08/2019   Proteinuria 06/14/2019   Elevated sed rate 05/23/2019   Elevated C-reactive protein (CRP) 05/23/2019   Diabetes mellitus without complication (HCC) 05/23/2019   Dyslipidemia (high  LDL; low HDL) 05/23/2019   Type 2 diabetes mellitus with hyperglycemia, without long-term current use of insulin (HCC) 02/17/2019   Hemoglobin A1C between 7% and 9% indicating borderline diabetic control (HCC) 02/17/2019   Breast cancer screening by mammogram 02/17/2019   Frequent headaches 02/14/2019   Anxiety 02/14/2019   Common migraine with intractable migraine 06/28/2017   Chest pain at rest 10/17/2016   Sinus tachycardia 08/18/2016   Prolonged Q-T interval on ECG 08/18/2016   Vertigo 07/22/2015   Abdominal discomfort 02/14/2015   Low back pain 02/14/2015   Abdominal pain 02/01/2015   Nausea without vomiting 02/01/2015   Environmental allergies 02/01/2015   Language barrier to communication 02/01/2015   Anemia 01/23/2015   Constipation 01/21/2015   Knee pain, bilateral 01/21/2015   Palpitations 09/18/2013   Shortness of breath 09/18/2013   Advanced maternal age in pregnancy in second trimester 06/19/2013   IBS (irritable bowel syndrome) 04/13/2003    PCP: Ivonne Andrew, NP  REFERRING PROVIDER: Horton Chin, MD  REFERRING DIAG: M79.18 (ICD-10-CM) - Cervical myofascial pain syndrome  THERAPY DIAG:  Cervicalgia  Abnormal posture  Muscle weakness (generalized)  Radiculopathy, cervicothoracic region  Pain in thoracic spine  Chronic right shoulder pain  Other low back pain  Rationale for Evaluation and Treatment: Rehabilitation  ONSET DATE: 3-4 months  SUBJECTIVE:                                                                                                                                                                                                         SUBJECTIVE STATEMENT: Pt reports that today she is having 8/10 pain in her neck and shoulders. She reports that starting 3 days ago she had numbness in the bottom of her L, no injury prior to onset of numbness. Pt does have an appointment with her vascular physician tomorrow and will also ask them  about this.  Pt reports she is not having any numbness down her arms or hands today. Pt continues to work on her HEP, she feels like it relieves her symptoms for a few hours then they return. She does feel like overall she has improved since starting PT.  From eval: Pt states she has been referred for her neck pain -- thinks it may be nerve related. Pt reports she feels severe neck pain. Pt states the neck pain goes down her shoulder and into both arms. Pt has just finished course of PT from [facility] on 05/30/22. Pt reports feeling intermittent N/T and dizziness. Pt feels most dizzy when she first wakes up from her sleep. Pt states she can do all of her housework but with pain.   Accompanied by: interpreter: Enayat Hand dominance: Right  PERTINENT HISTORY:  Has had 2 courses of PT in the past year  PAIN:  Are you having pain? Yes: NPRS scale: 8 Pain location: neck to bilat shoulders Pain description: very sharp Aggravating factors: any activities using her arms Relieving factors: Steroid injection  PRECAUTIONS: None  WEIGHT BEARING RESTRICTIONS: No  FALLS:  Has patient fallen in last 6 months? No  OCCUPATION: Mostly doing house work  PATIENT GOALS: Improve pain  NEXT MD VISIT: n/a  OBJECTIVE:  Note: Objective measures were completed at Evaluation unless otherwise noted.  DIAGNOSTIC FINDINGS:  Thoracic spine x-ray 04/09/22 (-) R shoulder x-ray 04/09/22 (-) No new imaging of her neck seen on Epic  PATIENT SURVEYS:  Neck Disability Index score: 18 / 50 = 36.0 %  SENSATION: N/T along mid neck/base of skull and can go along temples  POSTURE: forward head  PALPATION: Greatest tenderness in bilat cervical paraspinals and R>L UTs   CERVICAL ROM: a little dizziness with ext  Active ROM A/PROM (deg) eval  Flexion 35  Extension 30  Right lateral flexion 31 tightness on R  Left lateral flexion 38 tightness on R  Right rotation 38  Left rotation 35 pulls on R    (Blank rows = not tested)  UPPER EXTREMITY ROM:  Active ROM Right eval Left eval  Shoulder flexion    Shoulder extension    Shoulder abduction    Shoulder adduction    Shoulder extension    Shoulder internal rotation    Shoulder external rotation    Elbow flexion    Elbow extension    Wrist flexion    Wrist extension    Wrist ulnar deviation    Wrist radial deviation    Wrist pronation    Wrist supination     (Blank rows = not tested)  UPPER EXTREMITY MMT:  MMT Right eval Left eval  Shoulder flexion 5 5  Shoulder extension 3+ 4-  Shoulder abduction 4- 5  Shoulder adduction    Shoulder extension    Shoulder internal rotation 4- 5  Shoulder external rotation 3+ 5  Middle trapezius    Lower trapezius    Elbow flexion    Elbow extension    Wrist flexion    Wrist extension    Wrist ulnar deviation    Wrist radial deviation    Wrist pronation    Wrist supination    Grip strength     (Blank rows = not tested)  CERVICAL SPECIAL TESTS:  Cranial cervical flexion test: Positive and Spurling's test: Negative Unable to maintain cervical flexion at all Nerve tension test: Ulnar nerve (+) on R; Radial nerve (+) on R>L; Median nerve (+)  on R  FUNCTIONAL TESTS:  Cervical flexion test: 1 sec before increased shoulder compensation and pt starts to use anterior neck muscles Chin tuck in standing pt is unable to perform without bilat shoulder elevation or pt just flexing c-spine  TREATMENT DATE:  TherEx Supine SLR test, no nerve symptoms but has HS tightness Seated Slump Test, (+) Too intense to use as an exercise Supine LLE sciatic nerve glides x 10 reps   Review of previously prescribed HEP: Supine cervical retraction x 10 reps Deep neck flexor training x 10 reps Needs min cueing to perform correctly Supine horizontal shoulder abduction with red TB x 10 reps Standing at wall plank cervical retraction x 10 reps (vs prone)   To address low back pain: LTR x 10 reps  B SKTC 3 x 30 sec each B Piriformis stretch x 30 sec each B   Informed at end of patient's session that her insurance has denied coverage for PT so plan to d/c this session. Provided handout for local pro bono PT clinics in the area that she can contact for further therapy services.   PATIENT EDUCATION:  Education details: continue HEP, added to HEP, pro bono clinics, plan to d/c from PT today due to insurance denial Person educated: Patient Education method: Explanation, Demonstration, Tactile cues, Verbal cues, and Handouts Education comprehension: verbalized understanding and returned demonstration  HOME EXERCISE PROGRAM: Access Code: [email or web address] URL: [email or web address] Date: 06/13/2023 Prepared by: Vernon Prey April Kirstie Peri  Exercises - Seated Scapular Retraction  - 1 x daily - 7 x weekly - 2 sets - 10 reps - Supine Deep Neck Flexor Training  - 1 x daily - 7 x weekly - 2 sets - 10 reps - Supine Cervical Retraction with Towel  - 1 x daily - 7 x weekly - 2 sets - 10 reps - Gentle Levator Scapulae Stretch (Mirrored)  - 1 x daily - 7 x weekly - 2 sets - 30 sec hold - Standing Radial Nerve Glide  - 1 x daily - 7 x weekly - 2 sets - 10 reps - Ulnar Nerve Butterfly  - 1 x daily - 7 x weekly - 1 sets - 10 reps - Supine Shoulder Horizontal Abduction with Resistance  - 1 x daily - 7 x weekly - 2 sets - 10 reps - Shoulder Flexion Wall Slide with Towel  - 1 x daily - 7 x weekly - 3 sets - 10 reps - Scaption Wall Slide with Towel  - 1 x daily - 7 x weekly - 3 sets - 10 reps - Supine Sciatic Nerve Glide  - 1 x daily - 7 x weekly - 1 sets - 5-10 reps - Supine Lower Trunk Rotation  - 1 x daily - 7 x weekly - 3 sets - 10 reps - Supine Single Knee to Chest Stretch  - 1-2 x daily - 7 x weekly - 2 sets - 5 reps - 60 hold - Seated Figure 4 Piriformis Stretch  - 1 x daily - 7 x weekly - 1 sets - 3-5 reps - 30 sec hold   ASSESSMENT:  CLINICAL IMPRESSION: Emphasis of skilled PT  session on attempting to assess cause of L foot numbness, reviewing previously prescribed HEP, and adding to HEP to address her low back pain today. Per clinic administrative staff patient's coverage for physical therapy has been denied by her insurance so provided information for pro bono clinics so that patient can attempt to continue with PT services. Provided  patient with final handout of her HEP. Did not reassess LTG as this therapist was unaware that this would be patient's last session until near end of her session.    From eval: Patient is a 48 y.o. F who was seen today for physical therapy evaluation and treatment for neck pain with radiating symptoms down bilat UEs. Assessment is significant for R>L cervical muscle tightness, ongoing R>L UE weakness, and tenderness and (+) nerve tension tests on R for ulnar, median and radial nerve. Pt continues to have very weak deep neck flexors and extensors -- unable to properly perform chin tuck without compensatory movements. Pt will highly benefit from PT to address these issues for improved comfort with home tasks.   OBJECTIVE IMPAIRMENTS: decreased activity tolerance, decreased coordination, decreased endurance, decreased mobility, decreased ROM, decreased strength, dizziness, increased fascial restrictions, increased muscle spasms, impaired flexibility, impaired sensation, impaired UE functional use, postural dysfunction, and pain.    GOALS: Goals reviewed with patient? Yes  SHORT TERM GOALS: Target date: 06/27/2023   Pt will be ind with initial HEP Baseline:  Goal status: MET  2.  Pt will be able to maintain cervical flexion testing for >/=10 sec to demo improving neck stability Baseline:  06/22/23: Able to perform against gravity in standing without UT compensation Goal status: IN PROGRESS   LONG TERM GOALS: Target date: 07/18/2023   Pt will be ind with management and progression of HEP Baseline:  Goal status: MET  2.  Pt will report  no tightness or issues with nerve tension testing Baseline:  Goal status: NOT MET, early d/c due to insurance  3.  Pt will be able to maintain cervical flexion for >/=20 sec to demo increasing neck stability Baseline:  Goal status: NOT MET, early d/c due to insurance  4.  Pt will demo R = L UE strength for lifting and carrying tasks at home Baseline:  Goal status: NOT MET, early d/c due to insurance  5.  Pt will have improved NDI to </=26% to demo MCID Baseline:  Goal status: NOT MET, early d/c due to insurance     PLAN:  Discharge from PT due to lack of insurance coverage  Peter Congo, PT Peter Congo, PT, DPT, CSRS  07/04/2023, 10:18 AM   For all possible CPT codes, reference the Planned Interventions line above.     Check all conditions that are expected to impact treatment: {Conditions expected to impact treatment:Musculoskeletal disorders   If treatment provided at initial evaluation, no treatment charged due to lack of authorization.

## 2023-07-05 ENCOUNTER — Ambulatory Visit: Payer: Medicaid Other | Admitting: Physician Assistant

## 2023-07-05 ENCOUNTER — Ambulatory Visit (HOSPITAL_COMMUNITY)
Admission: RE | Admit: 2023-07-05 | Discharge: 2023-07-05 | Disposition: A | Discharge status: Home / Self Care | Payer: Medicaid Other | Source: Ambulatory Visit | Attending: Vascular Surgery | Admitting: Vascular Surgery

## 2023-07-05 VITALS — BP 109/74 | HR 81 | Temp 98.6°F | Resp 18 | Ht <= 58 in | Wt 132.8 lb

## 2023-07-05 DIAGNOSIS — I739 Peripheral vascular disease, unspecified: Secondary | ICD-10-CM | POA: Diagnosis not present

## 2023-07-05 DIAGNOSIS — M7989 Other specified soft tissue disorders: Secondary | ICD-10-CM

## 2023-07-05 LAB — VAS US ABI WITH/WO TBI
Left ABI: 1.21
Right ABI: 1.13

## 2023-07-05 NOTE — Progress Notes (Signed)
 Office Note   History of Present Illness   Betty Cain is a 49 y.o. (04/18/1975) female who presents for surveillance of PAD.  She has previously been evaluated for abnormal ABIs in the setting of diabetes.  She has a history of pain on the balls of her feet, potentially due to neuropathy.  She has no previous symptoms of claudication, rest pain, or tissue loss.  She returns today for follow-up.  She denies any classic symptoms of claudication.  She also denies any rest pain or tissue loss.  She has had some new onset issues with intermittent burning in the feet when walking over the past couple of months.  She also has occasional shooting pains in her legs due to lumbar spine disease.  She also endorses some occasional swelling to her feet, usually after prolonged periods of standing.  Current Outpatient Medications  Medication Sig Dispense Refill   Accu-Chek Softclix Lancets lancets Use as instructed 100 each 0   acetaminophen (TYLENOL) 500 MG tablet Take 500 mg by mouth every 6 (six) hours as needed.     aspirin EC 81 MG tablet Take 81 mg by mouth daily. Swallow whole.     bisoprolol (ZEBETA) 5 MG tablet Take 1 tablet (5 mg total) by mouth 2 (two) times daily. 180 tablet 3   blood glucose meter kit and supplies KIT Dispense based on patient and insurance preference. Use up to four times daily as directed. 1 each 0   Blood Pressure Monitoring DEVI 1 each by Does not apply route daily. 1 Device 0   Caraway Oil-Levomenthol (FDGARD) 25-20.75 MG CAPS Use as directed 12 capsule 0   cholecalciferol (VITAMIN D3) 25 MCG (1000 UNIT) tablet Take 1,000 Units by mouth daily.     cloNIDine (CATAPRES) 0.1 MG tablet Take 1 tablet by mouth twice daily 60 tablet 0   diclofenac Sodium (VOLTAREN ARTHRITIS PAIN) 1 % GEL Apply 2 g topically 4 (four) times daily. 2 g 3   docusate sodium (COLACE) 100 MG capsule Take 1 capsule (100 mg total) by mouth 2 (two) times daily. 10 capsule 0   DULoxetine (CYMBALTA) 30  MG capsule Take 1 capsule (30 mg total) by mouth daily. 30 capsule 3   ferrous sulfate (FEROSUL) 325 (65 FE) MG tablet TAKE 1 TABLET (325 MG TOTAL) BY MOUTH DAILY. 90 tablet 3   fluticasone (FLONASE) 50 MCG/ACT nasal spray Place 2 sprays into both nostrils daily. 48 g 1   Fremanezumab-vfrm (AJOVY) 225 MG/1.5ML SOAJ Inject 225 mg into the skin every 30 (thirty) days. 1.5 mL 11   glucose blood (ACCU-CHEK GUIDE) test strip Use as instructed 100 each 12   ibuprofen (ADVIL) 800 MG tablet Take 800 mg by mouth every 6 (six) hours as needed.     ivabradine (CORLANOR) 5 MG TABS tablet Take 3 tablets by mouth two hours prior to cardiac CT scan. 3 tablet 0   KLOR-CON M20 20 MEQ tablet Take 1 tablet by mouth once daily 30 tablet 0   levocetirizine (XYZAL) 5 MG tablet TAKE 1 TABLET BY MOUTH ONCE DAILY IN THE EVENING 30 tablet 0   lidocaine (LIDODERM) 5 % USE 1 PATCH EXTERNALLY ONCE DAILY. REMOVE AND DISCARD PATCH WITHIN 12 HOURS OR AS DIRECTED BY MD 30 patch 1   metFORMIN (GLUCOPHAGE-XR) 500 MG 24 hr tablet Take 1 tab daily with largest meal TAKE WITH FOOD 90 tablet 0   methylcellulose (CITRUCEL) oral powder Take as directed, daily  Multiple Vitamin (MULTI VITAMIN DAILY PO) Take by mouth.     ondansetron (ZOFRAN) 4 MG tablet TAKE 1 TABLET BY MOUTH EVERY 8 HOURS AS NEEDED FOR NAUSEA FOR VOMITING 30 tablet 0   pantoprazole (PROTONIX) 40 MG tablet Take 1 tablet (40 mg total) by mouth 2 (two) times daily before a meal. 60 tablet 3   pregabalin (LYRICA) 50 MG capsule Take 1 capsule (50 mg total) by mouth at bedtime. 90 capsule 3   Rimegepant Sulfate 75 MG TBDP Take 1 tab at onset of migraine. May repeat in 2 hrs, if needed. Max dose: 2 tabs/day or 15/month. This is a 90 day rx. 45 tablet 3   rosuvastatin (CRESTOR) 5 MG tablet TAKE 1 TABLET (5 MG TOTAL) BY MOUTH AT BEDTIME. 30 tablet 0   SPRINTEC 28 0.25-35 MG-MCG tablet Take 1 tablet by mouth once daily 28 tablet 0   sucralfate (CARAFATE) 1 g tablet Take 1  tablet (1 g total) by mouth every 6 (six) hours as needed. Slowly dissolve 1 tablet in 1 tablespoon of distilled water prior to ingestion. 60 tablet 1   tiZANidine (ZANAFLEX) 4 MG tablet Take 4 mg by mouth every 6 (six) hours.     VENTOLIN HFA 108 (90 Base) MCG/ACT inhaler INHALE 2 PUFFS BY MOUTH EVERY 6 HOURS AS NEEDED FOR WHEEZING FOR SHORTNESS OF BREATH 18 g 0   dicyclomine (BENTYL) 10 MG/5ML solution Take 5 mLs (10 mg total) by mouth 4 (four) times daily -  before meals and at bedtime. (Patient not taking: Reported on 04/22/2023) 600 mL 0   No current facility-administered medications for this visit.    REVIEW OF SYSTEMS (negative unless checked):   Cardiac:  []  Chest pain or chest pressure? []  Shortness of breath upon activity? []  Shortness of breath when lying flat? []  Irregular heart rhythm?  Vascular:  []  Pain in calf, thigh, or hip brought on by walking? []  Pain in feet at night that wakes you up from your sleep? []  Blood clot in your veins? []  Leg swelling?  Pulmonary:  []  Oxygen at home? []  Productive cough? []  Wheezing?  Neurologic:  []  Sudden weakness in arms or legs? []  Sudden numbness in arms or legs? []  Sudden onset of difficult speaking or slurred speech? []  Temporary loss of vision in one eye? []  Problems with dizziness?  Gastrointestinal:  []  Blood in stool? []  Vomited blood?  Genitourinary:  []  Burning when urinating? []  Blood in urine?  Psychiatric:  []  Major depression  Hematologic:  []  Bleeding problems? []  Problems with blood clotting?  Dermatologic:  []  Rashes or ulcers?  Constitutional:  []  Fever or chills?  Ear/Nose/Throat:  []  Change in hearing? []  Nose bleeds? []  Sore throat?  Musculoskeletal:  [x]  Back pain? []  Joint pain? []  Muscle pain?   Physical Examination   Vitals:   07/05/23 1234  BP: 109/74  Pulse: 81  Resp: 18  Temp: 98.6 F (37 C)  TempSrc: Temporal  SpO2: 98%  Weight: 132 lb 12.8 oz (60.2 kg)   Height: 4\' 10"  (1.473 m)   Body mass index is 27.76 kg/m.  General:  WDWN in NAD; vital signs documented above Gait: Not observed HENT: WNL, normocephalic Pulmonary: normal non-labored breathing , without rales, rhonchi,  wheezing Cardiac: Regular Abdomen: soft, NT, no masses Skin: without rashes Vascular Exam/Pulses: Palpable PT pulses bilaterally Extremities: without ischemic changes, without gangrene , without cellulitis; without open wounds;  Musculoskeletal: no muscle wasting or atrophy  Neurologic: A&O X  3;  No focal weakness or paresthesias are detected Psychiatric:  The pt has Normal affect.  Non-Invasive Vascular imaging   ABI (07/05/2023) R:  ABI: 1.13 (1.03),  PT: tri DP: tri TBI: 1.02 L:  ABI: 1.21 (1.05),  PT: tri DP: tri TBI: 0.97   Medical Decision Making   Betty Cain is a 48 y.o. female who presents for surveillance of PAD  Based on the patient's vascular studies, her ABIs are essentially unchanged from her last visit.  Her right ABI is 1.13 and left ABI is 1.21 She has previously been evaluated by Dr. Chestine Spore for abnormal ABIs and pain at the balls of her feet.  This was not felt to be due to arterial insufficiency At today's visit she states she has been experiencing some new issues with burning hot pain in her feet when walking.  She also states after long periods of walking her feet will also swell a little bit.  She denies any consistent pain in her legs when she walks.  She also denies any rest pain or tissue loss On exam she has palpable PT pulses.  Her feet are warm to touch I have explained to the patient that her burning pain/heat in her feet when walking sounds like neuropathic pain.  She does have a history of pain in the balls of her feet.  Her neuropathic pain could be due to her diabetes or lumbar spine disease. I have recommended that she can try compression stockings to help with her foot swelling.  She was measured for and given a pair of 15  to 20 mmHg knee-high compression stockings. She does not require intervention at this time given that she does not have any rest pain or tissue loss.  She should continue her daily baby aspirin.  She can follow-up with our office in 1 year with repeat ABIs   Loel Dubonnet PA-C [facility] Office: [phone]  Clinic MD: Steve Rattler

## 2023-07-06 ENCOUNTER — Ambulatory Visit: Payer: Medicaid Other | Admitting: Physical Therapy

## 2023-07-09 ENCOUNTER — Other Ambulatory Visit: Payer: Self-pay | Admitting: Nurse Practitioner

## 2023-07-09 ENCOUNTER — Other Ambulatory Visit: Payer: Self-pay | Admitting: Gastroenterology

## 2023-07-12 ENCOUNTER — Ambulatory Visit: Payer: Medicaid Other | Admitting: Physical Therapy

## 2023-07-14 ENCOUNTER — Ambulatory Visit: Payer: Medicaid Other | Admitting: Physical Therapy

## 2023-07-18 ENCOUNTER — Ambulatory Visit: Payer: Medicaid Other | Admitting: Physical Therapy

## 2023-07-19 ENCOUNTER — Ambulatory Visit (INDEPENDENT_AMBULATORY_CARE_PROVIDER_SITE_OTHER): Admitting: Adult Health

## 2023-07-20 ENCOUNTER — Ambulatory Visit (INDEPENDENT_AMBULATORY_CARE_PROVIDER_SITE_OTHER): Admitting: Adult Health

## 2023-07-20 ENCOUNTER — Encounter (INDEPENDENT_AMBULATORY_CARE_PROVIDER_SITE_OTHER): Payer: Self-pay

## 2023-07-20 ENCOUNTER — Ambulatory Visit: Payer: Medicaid Other | Admitting: Physical Therapy

## 2023-07-20 ENCOUNTER — Encounter (INDEPENDENT_AMBULATORY_CARE_PROVIDER_SITE_OTHER): Payer: Self-pay | Admitting: Adult Health

## 2023-07-20 VITALS — BP 111/73 | HR 106 | Temp 98.6°F | Ht <= 58 in | Wt 128.0 lb

## 2023-07-20 DIAGNOSIS — E669 Obesity, unspecified: Secondary | ICD-10-CM

## 2023-07-20 DIAGNOSIS — I152 Hypertension secondary to endocrine disorders: Secondary | ICD-10-CM

## 2023-07-20 DIAGNOSIS — Z6826 Body mass index (BMI) 26.0-26.9, adult: Secondary | ICD-10-CM | POA: Diagnosis not present

## 2023-07-20 DIAGNOSIS — Z7984 Long term (current) use of oral hypoglycemic drugs: Secondary | ICD-10-CM

## 2023-07-20 DIAGNOSIS — R109 Unspecified abdominal pain: Secondary | ICD-10-CM | POA: Diagnosis not present

## 2023-07-20 DIAGNOSIS — E66811 Obesity, class 1: Secondary | ICD-10-CM

## 2023-07-20 DIAGNOSIS — E1159 Type 2 diabetes mellitus with other circulatory complications: Secondary | ICD-10-CM | POA: Diagnosis not present

## 2023-07-20 DIAGNOSIS — E1169 Type 2 diabetes mellitus with other specified complication: Secondary | ICD-10-CM | POA: Diagnosis not present

## 2023-07-20 MED ORDER — METFORMIN HCL ER 500 MG PO TB24: ORAL_TABLET | ORAL | 0 refills | Status: DC

## 2023-07-20 NOTE — Progress Notes (Signed)
 WEIGHT SUMMARY AND BIOMETRICS  Vitals Temp: 98.6 F (37 C) BP: 111/73 Pulse Rate: (!) 106 SpO2: 99 %   Anthropometric Measurements Height: 4\' 10"  (1.473 m) Weight: 128 lb (58.1 kg) BMI (Calculated): 26.76 Weight at Last Visit: 129lb Weight Lost Since Last Visit: 1lb Weight Gained Since Last Visit: 0 Starting Weight: 152lb Total Weight Loss (lbs): 24 lb (10.9 kg)   Body Composition  Body Fat %: 39.3 % Fat Mass (lbs): 50.4 lbs Muscle Mass (lbs): 73.8 lbs Total Body Water (lbs): 55.2 lbs Visceral Fat Rating : 7   Other Clinical Data Fasting: no Labs: no Today's Visit #: 44 Starting Date: 01/10/20    Chief Complaint:   OBESITY Betty Cain is here to discuss her progress with her obesity treatment plan.  She is on the the Category 2 Plan and states she is following her eating plan approximately 50 % of the time.  She states she is exercising "General Exercise" 25 minutes 1 times per week.   Interim History:  She completed Ramadan at the end of March Exercise resumed-walking and NEAT Activities  LENGTHY discussion about her current medications- she reports starting Cymbalts 30mg  daily for abdominal pain, managed by GI She then stopped over SE concerns. She is unsure of last date of use of Cymbalta 30mg  She does fell like the Cymbalta 30mg  did help reduce abodminal pain when she took consistently  She endorses continuing abdominal pain   Of note- [place] Interpreter, Jeanette Caprice, at [facility] during OV  Subjective:   1. Abdominal pain, unspecified abdominal location She continues to experience intermittent abdominal pain. She contacted her established GI/Dr. Adela Lank She was started on Cymbalta 30mg  daily She denies current GI upset, however reports she experienced abdominal pain yesterday.  2. Type 2 diabetes mellitus with obesity (HCC) Lab Results  Component Value Date   HGBA1C 5.3 05/12/2023   HGBA1C 5.4 04/22/2023   HGBA1C 5.3 03/07/2023    ] Se has been inconsistently taking daily Metformin XR 500mg  When she last used on an empty stomach she experienced abdominal pain She has not been checking home CBG Discussed at length on how to properly take Metformin XR 500mg  and the importance to take as it is only antidiabetic she currently Rx'd Ozempic use only if endorses by GI specialist  3. Hypertension associated with diabetes (HCC) BP at goal at OV She denies CP at present She did experience 10 days of CP and increased diffuse pain on/about 06/14/2023 This is the message that she sent to her PCP via MyChart "Good morning I need help. I have chest pain, throat pain, headache, cough, stomach pain. And pain in every bone in my body. Have a nice day"  She was advised to be seen immediately at [facility] She never went UC for evaluation CP lasted 10 days- then self resolved  She is currently on: aspirin EC 81 MG tablet  bisoprolol (ZEBETA) 5 MG tablet  rosuvastatin (CRESTOR) 5 MG tablet  cloNIDine (CATAPRES) 0.1 MG tablet  metFORMIN (GLUCOPHAGE-XR) 500 MG 24 hr tablet   Assessment/Plan:   1. Abdominal pain, unspecified abdominal location (Primary) MyChart Dr. Adela Lank with hx of her Cymbalta use and request refill F/u with Dr. Adela Lank Aug 23, 2023  2. Type 2 diabetes mellitus with obesity (HCC) ReStart metFORMIN (GLUCOPHAGE-XR) 500 MG 24 hr tablet Take 1 tab daily with largest meal TAKE WITH FOOD Dispense: 90 tablet, Refills: 0 ordered  3. Hypertension associated with diabetes (HCC) Remain well hydrated with  water Continue regular exercise Continue  aspirin EC 81 MG tablet  bisoprolol (ZEBETA) 5 MG tablet  rosuvastatin (CRESTOR) 5 MG tablet  cloNIDine (CATAPRES) 0.1 MG tablet  metFORMIN (GLUCOPHAGE-XR) 500 MG 24 hr tablet   4. Obesity, current BMI 26.76  Sincere is currently in the action stage of change. As such, her goal is to continue with weight loss efforts. She has agreed to the Category 2 Plan.   Exercise goals:  All adults should avoid inactivity. Some physical activity is better than none, and adults who participate in any amount of physical activity gain some health benefits. Adults should also include muscle-strengthening activities that involve all major muscle groups on 2 or more days a week.  Behavioral modification strategies: increasing lean protein intake, decreasing simple carbohydrates, increasing vegetables, increasing water intake, no skipping meals, meal planning and cooking strategies, keeping healthy foods in the home, and planning for success.  Elyce has agreed to follow-up with our clinic in 4 weeks. She was informed of the importance of frequent follow-up visits to maximize her success with intensive lifestyle modifications for her multiple health conditions.   Check Fasting Labs at next OV  Objective:   Blood pressure 111/73, pulse (!) 106, temperature 98.6 F (37 C), height 4\' 10"  (1.473 m), weight 128 lb (58.1 kg), SpO2 99%. Body mass index is 26.75 kg/m.  General: Cooperative, alert, well developed, in no acute distress. HEENT: Conjunctivae and lids unremarkable. Cardiovascular: Regular rhythm.  Lungs: Normal work of breathing. Neurologic: No focal deficits.   Lab Results  Component Value Date   CREATININE 0.78 05/12/2023   BUN 13 05/12/2023   NA 142 05/12/2023   K 4.5 05/12/2023   CL 102 05/12/2023   CO2 21 05/12/2023   Lab Results  Component Value Date   ALT 17 05/12/2023   AST 22 05/12/2023   ALKPHOS 88 05/12/2023   BILITOT 0.5 05/12/2023   Lab Results  Component Value Date   HGBA1C 5.3 05/12/2023   HGBA1C 5.4 04/22/2023   HGBA1C 5.3 03/07/2023   HGBA1C 5.2 10/22/2022   HGBA1C 5.2 06/21/2022   Lab Results  Component Value Date   INSULIN 6.2 05/12/2023   INSULIN 6.5 03/07/2023   INSULIN 7.9 06/21/2022   INSULIN 3.8 12/28/2021   INSULIN 8.7 01/10/2020   Lab Results  Component Value Date   TSH 1.710 05/12/2023   Lab Results  Component Value Date    CHOL 144 03/07/2023   HDL 42 03/07/2023   LDLCALC 89 03/07/2023   TRIG 66 03/07/2023   CHOLHDL 3.4 03/07/2023   Lab Results  Component Value Date   VD25OH 45.8 05/12/2023   VD25OH 41.0 03/07/2023   VD25OH 51.2 06/21/2022   Lab Results  Component Value Date   WBC 13.8 (H) 12/13/2022   HGB 13.7 12/13/2022   HCT 42.0 12/13/2022   MCV 90.9 12/13/2022   PLT 316 12/13/2022   Lab Results  Component Value Date   IRON 53 10/22/2022   TIBC 253 10/22/2022   FERRITIN 295 (H) 10/22/2022   Attestation Statements:   Reviewed by clinician on day of visit: allergies, medications, problem list, medical history, surgical history, family history, social history, and previous encounter notes.  I have reviewed the above documentation for accuracy and completeness, and I agree with the above. -  Kaena Santori d. Johari Pinney, NP-C

## 2023-07-21 ENCOUNTER — Ambulatory Visit: Payer: Self-pay | Admitting: Nurse Practitioner

## 2023-07-21 ENCOUNTER — Encounter: Payer: Self-pay | Admitting: Nurse Practitioner

## 2023-07-21 VITALS — BP 110/67 | HR 88 | Temp 98.6°F | Wt 131.6 lb

## 2023-07-21 DIAGNOSIS — J069 Acute upper respiratory infection, unspecified: Secondary | ICD-10-CM | POA: Diagnosis not present

## 2023-07-21 DIAGNOSIS — R112 Nausea with vomiting, unspecified: Secondary | ICD-10-CM

## 2023-07-21 DIAGNOSIS — E1165 Type 2 diabetes mellitus with hyperglycemia: Secondary | ICD-10-CM

## 2023-07-21 LAB — POCT GLYCOSYLATED HEMOGLOBIN (HGB A1C): Hemoglobin A1C: 5.4 % (ref 4.0–5.6)

## 2023-07-21 MED ORDER — AMOXICILLIN-POT CLAVULANATE 875-125 MG PO TABS: 1.0000 | ORAL_TABLET | Freq: Two times a day (BID) | ORAL | 0 refills | Status: DC

## 2023-07-21 MED ORDER — ONDANSETRON 4 MG PO TBDP: 4.0000 mg | ORAL_TABLET | Freq: Three times a day (TID) | ORAL | 0 refills | Status: DC | PRN

## 2023-07-21 MED ORDER — ONDANSETRON 4 MG PO TBDP: 4.0000 mg | ORAL_TABLET | Freq: Once | ORAL | Status: AC

## 2023-07-21 NOTE — Patient Instructions (Signed)
 1. Type 2 diabetes mellitus with hyperglycemia, without long-term current use of insulin (HCC) (Primary)  - POCT glycosylated hemoglobin (Hb A1C)  2. Nausea and vomiting, unspecified vomiting type  - ondansetron (ZOFRAN-ODT) disintegrating tablet 4 mg  3. Upper respiratory tract infection, unspecified type  - amoxicillin-clavulanate (AUGMENTIN) 875-125 MG tablet; Take 1 tablet by mouth 2 (two) times daily.  Dispense: 20 tablet; Refill: 0

## 2023-07-21 NOTE — Progress Notes (Signed)
 Subjective   Patient ID: Betty Cain, female    DOB: 1975/10/15, 48 y.o.   MRN: [id number]  Chief Complaint  Patient presents with   Cough    Patient stated that she has a cough for 10 days now   Emesis    Patient stated that she woke up throwing up     Referring provider: Jerrlyn Morel, NP  Betty Cain is a 48 y.o. 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  No date: Palpitations No date: Shortness of breath No date: Shortness of breath on exertion No date: Spinal headache No date: Swelling of both lower extremities No date: Valvular heart disease 02/2019: Vitamin D  deficiency   HPI   Patient presents today for follow-up visit.  A1C today was 5.4. feeling better. Patient is following with pain management. States that it is helping. Also following with weight loss clinic. Does complain today of some chest congestion. This has started over the past few days. Will trial azithromycin .  Denies f/c/s, n/v/d, hemoptysis, PND, leg swelling. Denies chest pain or edema.       Allergies  Allergen Reactions   Seasonal Ic [Octacosanol]    Topamax  [Topiramate ] Itching and Rash    Immunization History  Administered Date(s) Administered   Influenza,inj,Quad PF,6+ Mos 06/19/2013, 12/02/2015   PFIZER(Purple Top)SARS-COV-2 Vaccination 07/07/2019, 07/28/2019   Pneumococcal Conjugate-13 05/21/2019   Tdap 09/13/2013, 12/11/2013    Tobacco History: Social History   Tobacco Use   Smoking Status Never  Smokeless Tobacco Never   Counseling given: Not Answered   Outpatient Encounter Medications as of 07/21/2023  Medication Sig   acetaminophen  (TYLENOL ) 500 MG tablet Take 500 mg by mouth every 6 (six) hours as needed.   amoxicillin -clavulanate (AUGMENTIN ) 875-125 MG tablet Take 1 tablet by mouth 2 (two) times daily.   aspirin EC 81 MG tablet Take 81 mg by mouth daily. Swallow whole.   bisoprolol  (ZEBETA ) 5 MG tablet Take 1 tablet (5 mg total) by mouth 2 (two) times daily.   blood glucose meter kit and supplies KIT Dispense based on patient and insurance preference. Use up to four times daily as directed.   Blood Pressure Monitoring DEVI 1 each by Does not apply route daily.   cholecalciferol  (VITAMIN D3) 25 MCG (1000 UNIT) tablet Take 1,000 Units by mouth daily.   cloNIDine  (CATAPRES ) 0.1 MG tablet Take 1 tablet by mouth twice daily   diclofenac  Sodium (VOLTAREN  ARTHRITIS PAIN) 1 % GEL Apply 2 g topically 4 (four) times daily.   docusate sodium  (COLACE) 100 MG capsule Take 1 capsule (100 mg total) by mouth 2 (two) times daily.   DULoxetine  (CYMBALTA ) 30 MG capsule Take 30 mg by mouth daily.   ferrous sulfate  (FEROSUL) 325 (65 FE) MG tablet TAKE 1 TABLET (325 MG TOTAL) BY MOUTH DAILY.   Fremanezumab -vfrm (AJOVY ) 225 MG/1.5ML SOAJ Inject 225 mg into the skin every 30 (thirty) days.   glucose blood (ACCU-CHEK GUIDE) test strip Use as instructed   ibuprofen  (ADVIL ) 800  MG tablet Take 800 mg by mouth every 6 (six) hours as needed.   KLOR-CON  M20 20 MEQ tablet Take 1 tablet by mouth once daily   levocetirizine (XYZAL ) 5 MG tablet TAKE 1 TABLET BY MOUTH ONCE DAILY IN THE EVENING   lidocaine  (LIDODERM ) 5 % USE 1 PATCH EXTERNALLY ONCE DAILY. REMOVE AND DISCARD PATCH WITHIN 12 HOURS OR AS DIRECTED BY MD   metFORMIN  (GLUCOPHAGE -XR) 500 MG 24 hr tablet Take 1 tab daily with largest meal TAKE WITH FOOD   methylcellulose (CITRUCEL) oral powder Take as directed, daily   Multiple  Vitamin (MULTI VITAMIN DAILY PO) Take by mouth.   ondansetron  (ZOFRAN ) 4 MG tablet TAKE 1 TABLET BY MOUTH EVERY 8 HOURS AS NEEDED FOR NAUSEA FOR VOMITING   ondansetron  (ZOFRAN -ODT) 4 MG disintegrating tablet Take 1 tablet (4 mg total) by mouth every 8 (eight) hours as needed.   pregabalin  (LYRICA ) 50 MG capsule Take 1 capsule (50 mg total) by mouth at bedtime.   Rimegepant Sulfate  75 MG TBDP Take 1 tab at onset of migraine. May repeat in 2 hrs, if needed. Max dose: 2 tabs/day or 15/month. This is a 90 day rx.   sucralfate  (CARAFATE ) 1 g tablet Take 1 tablet (1 g total) by mouth every 6 (six) hours as needed. Slowly dissolve 1 tablet in 1 tablespoon of distilled water prior to ingestion.   [DISCONTINUED] Accu-Chek Softclix Lancets lancets Use as instructed   [DISCONTINUED] fluticasone  (FLONASE ) 50 MCG/ACT nasal spray Place 2 sprays into both nostrils daily.   [DISCONTINUED] rosuvastatin  (CRESTOR ) 5 MG tablet TAKE 1 TABLET (5 MG TOTAL) BY MOUTH AT BEDTIME.   [DISCONTINUED] VENTOLIN  HFA 108 (90 Base) MCG/ACT inhaler INHALE 2 PUFFS BY MOUTH EVERY 6 HOURS AS NEEDED FOR WHEEZING FOR SHORTNESS OF BREATH   dicyclomine  (BENTYL ) 10 MG/5ML solution Take 5 mLs (10 mg total) by mouth 4 (four) times daily -  before meals and at bedtime. (Patient not taking: Reported on 04/22/2023)   Facility-Administered Encounter Medications as of 07/21/2023  Medication   ondansetron  (ZOFRAN -ODT) disintegrating tablet 4 mg    Review of Systems  Review of Systems  Constitutional: Negative.   HENT: Negative.    Cardiovascular: Negative.   Gastrointestinal: Negative.   Allergic/Immunologic: Negative.   Neurological: Negative.   Psychiatric/Behavioral: Negative.       Objective:   BP 110/67   Pulse 88   Temp 98.6 F (37 C)   Wt 131 lb 9.6 oz (59.7 kg)   SpO2 98%   BMI 27.50 kg/m   Wt Readings from Last 5 Encounters:  08/01/23 127 lb (57.6 kg)  07/21/23 131 lb 9.6 oz (59.7 kg)  07/20/23 128 lb (58.1 kg)   07/05/23 132 lb 12.8 oz (60.2 kg)  06/14/23 129 lb (58.5 kg)     Physical Exam Vitals and nursing note reviewed.  Constitutional:      General: She is not in acute distress.    Appearance: She is well-developed.  Cardiovascular:     Rate and Rhythm: Normal rate and regular rhythm.  Pulmonary:     Effort: Pulmonary effort is normal.     Breath sounds: Normal breath sounds.  Neurological:     Mental Status: She is alert and oriented to person, place, and time.       Assessment & Plan:   Type 2 diabetes mellitus with hyperglycemia, without long-term current use of insulin  (HCC) -     POCT glycosylated hemoglobin (Hb A1C)  Nausea and vomiting, unspecified  vomiting type -     Ondansetron   Upper respiratory tract infection, unspecified type -     Amoxicillin -Pot Clavulanate; Take 1 tablet by mouth 2 (two) times daily.  Dispense: 20 tablet; Refill: 0  Other orders -     Ondansetron ; Take 1 tablet (4 mg total) by mouth every 8 (eight) hours as needed.  Dispense: 20 tablet; Refill: 0     Return in about 3 months (around 10/20/2023).   Jerrlyn Morel, NP 08/10/2023

## 2023-07-28 ENCOUNTER — Other Ambulatory Visit: Payer: Self-pay | Admitting: Nurse Practitioner

## 2023-07-28 DIAGNOSIS — Z0000 Encounter for general adult medical examination without abnormal findings: Secondary | ICD-10-CM

## 2023-08-01 ENCOUNTER — Other Ambulatory Visit (HOSPITAL_COMMUNITY): Payer: Self-pay

## 2023-08-01 ENCOUNTER — Encounter
Payer: Medicaid Other | Attending: Physical Medicine and Rehabilitation | Admitting: Physical Medicine and Rehabilitation

## 2023-08-01 ENCOUNTER — Telehealth: Payer: Self-pay | Admitting: Pharmacist

## 2023-08-01 VITALS — BP 105/75 | HR 90 | Ht <= 58 in | Wt 127.0 lb

## 2023-08-01 DIAGNOSIS — M7918 Myalgia, other site: Secondary | ICD-10-CM | POA: Insufficient documentation

## 2023-08-01 MED ORDER — METHOCARBAMOL 750 MG PO TABS: 750.0000 mg | ORAL_TABLET | Freq: Every evening | ORAL | 3 refills | Status: AC | PRN

## 2023-08-01 NOTE — Progress Notes (Signed)
 Trigger Point Injection  Indication: Cervical myofascial pain not relieved by medication management and other conservative care.  Informed consent was obtained after describing risk and benefits of the procedure with the patient, this includes bleeding, bruising, infection and medication side effects.  The patient wishes to proceed and has given written consent.  The patient was placed in a seated position.  The area of pain was marked and prepped with Betadine.  It was entered with a 25-gauge 1/2 inch needle and a total of 5 mL of 1% lidocaine and normal saline was injected into a total of 4 trigger points, after negative draw back for blood.  The patient tolerated the procedure well.  Post procedure instructions were given.

## 2023-08-01 NOTE — Telephone Encounter (Signed)
 Pharmacy Patient Advocate Encounter  Insurance verification completed.   The patient is insured through [facility]   Ran test claim for AJOVY  (fremanezumab -vfrm) injection 225MG /1.5ML auto-injectors. Currently a quantity of 1.71ml is a 30 day supply and the co-pay is $4.00 . No PA needed at this time.  This test claim was processed through [facility]- copay amounts may vary at other pharmacies due to pharmacy/plan contracts, or as the patient moves through the different stages of their insurance plan.

## 2023-08-01 NOTE — Patient Instructions (Signed)
 Robaxin  IcyHot Turmeric

## 2023-08-08 ENCOUNTER — Other Ambulatory Visit: Payer: Self-pay | Admitting: Physical Medicine and Rehabilitation

## 2023-08-08 ENCOUNTER — Other Ambulatory Visit: Payer: Self-pay | Admitting: Nurse Practitioner

## 2023-08-08 DIAGNOSIS — J3089 Other allergic rhinitis: Secondary | ICD-10-CM

## 2023-08-08 DIAGNOSIS — E1140 Type 2 diabetes mellitus with diabetic neuropathy, unspecified: Secondary | ICD-10-CM

## 2023-08-09 ENCOUNTER — Other Ambulatory Visit: Payer: Self-pay | Admitting: Nurse Practitioner

## 2023-08-09 ENCOUNTER — Other Ambulatory Visit: Payer: Self-pay | Admitting: Gastroenterology

## 2023-08-10 ENCOUNTER — Encounter: Payer: Self-pay | Admitting: Nurse Practitioner

## 2023-08-12 ENCOUNTER — Encounter (INDEPENDENT_AMBULATORY_CARE_PROVIDER_SITE_OTHER): Payer: Self-pay | Admitting: Adult Health

## 2023-08-15 ENCOUNTER — Encounter (INDEPENDENT_AMBULATORY_CARE_PROVIDER_SITE_OTHER): Payer: Self-pay | Admitting: Adult Health

## 2023-08-15 IMAGING — DX DG FOOT COMPLETE 3+V*L*
3 series · 3 of 3 positions shown · non-contrast
Comparison: 01/02/2020.

CLINICAL DATA: Pain and bruising without injury, mid left dorsal
foot.

EXAM:
LEFT FOOT - COMPLETE 3+ VIEW

[foot ap]
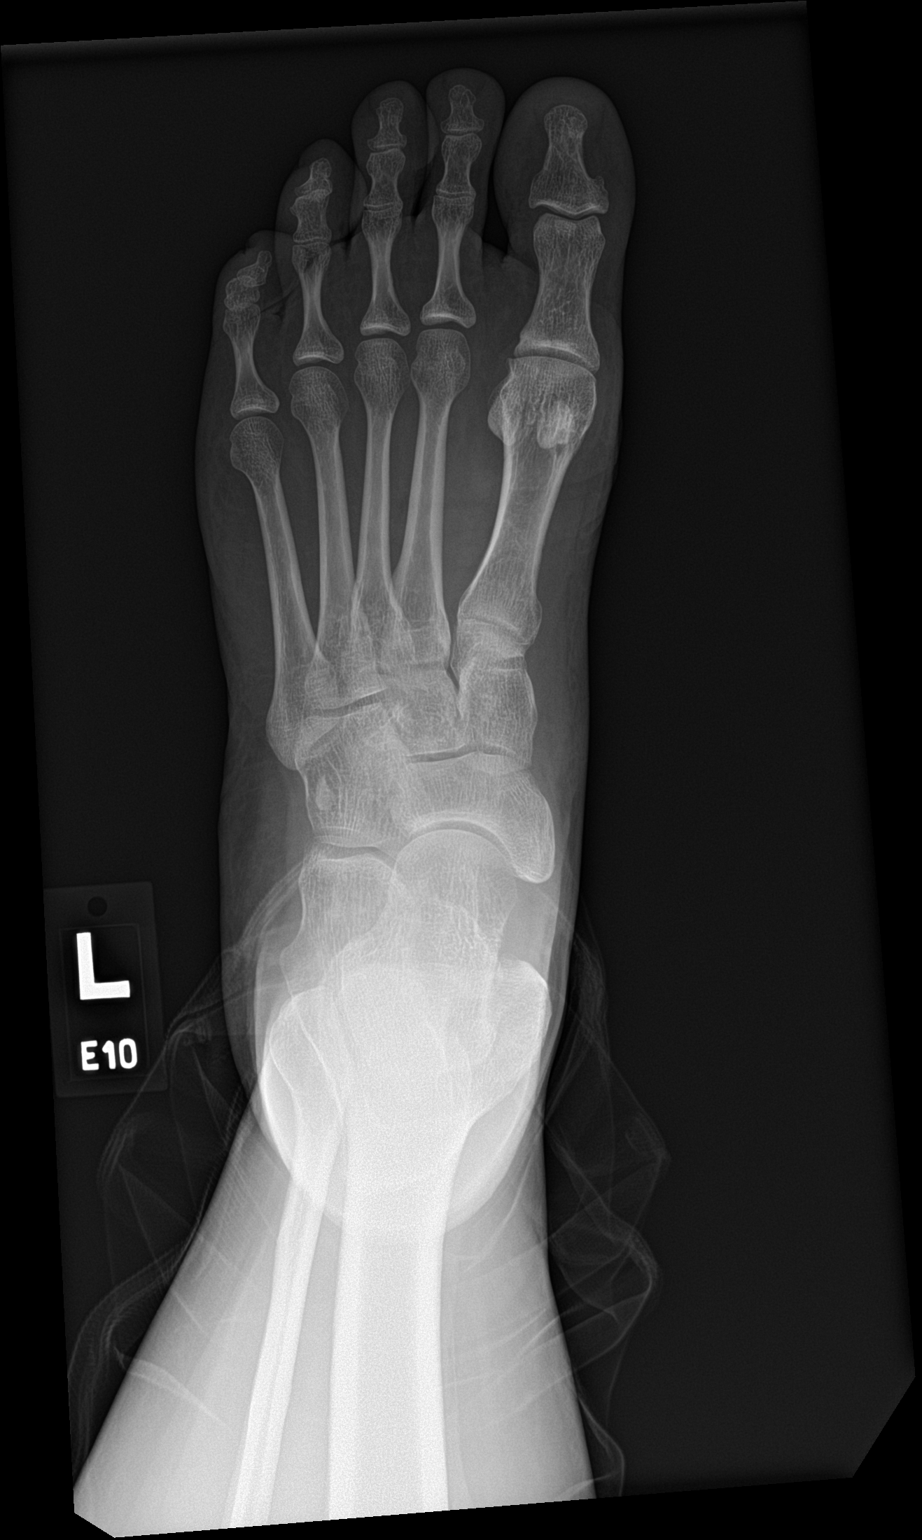

[foot obl]
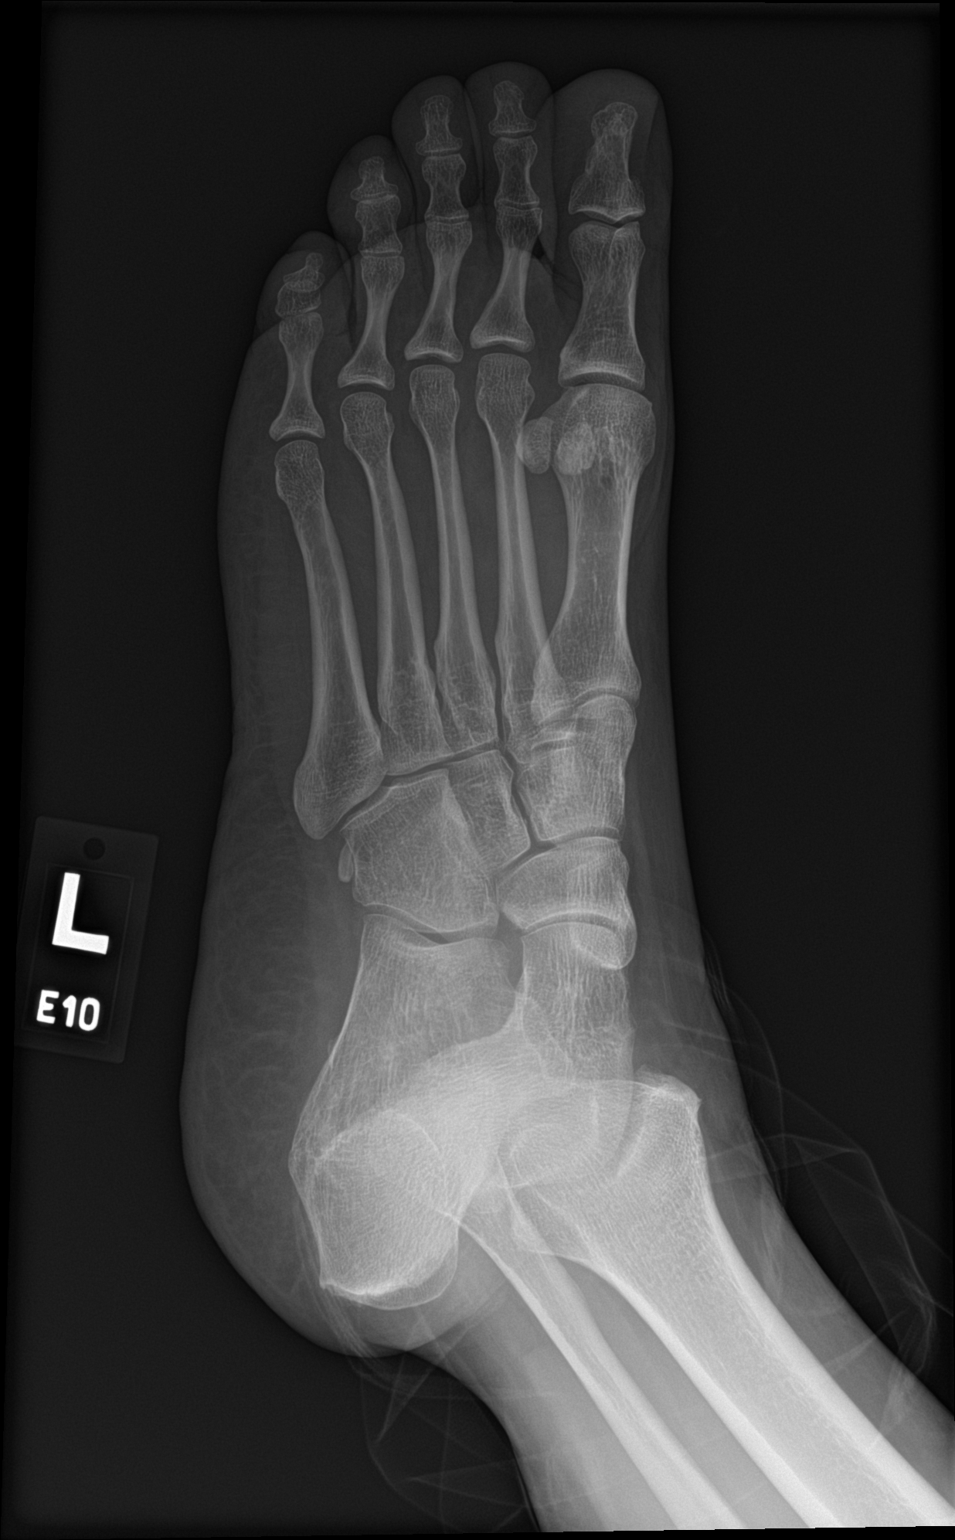

[foot lat]
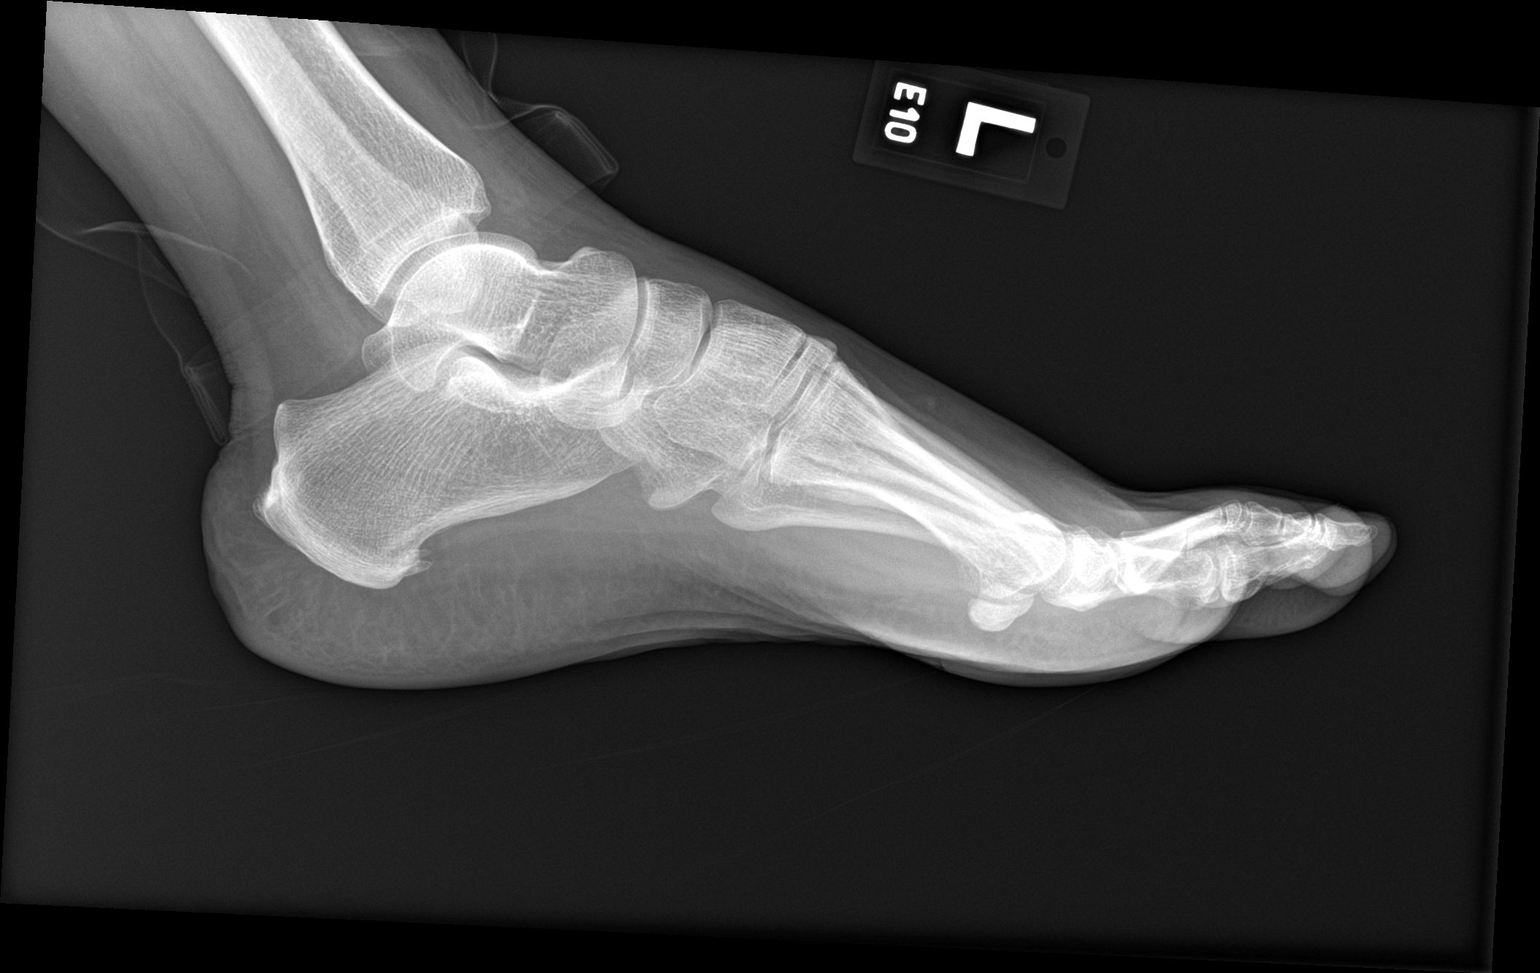

[3 of 3 positions shown; findings below may reference images not displayed]

FINDINGS: There is no evidence of fracture or dislocation. Degenerative
changes are present at the first metatarsophalangeal joint.
Calcaneal spurring is noted. Soft tissues are unremarkable.
IMPRESSION: No acute osseous abnormality.

## 2023-08-20 ENCOUNTER — Other Ambulatory Visit: Payer: Self-pay | Admitting: Nurse Practitioner

## 2023-08-20 DIAGNOSIS — Z0000 Encounter for general adult medical examination without abnormal findings: Secondary | ICD-10-CM

## 2023-08-22 NOTE — Progress Notes (Unsigned)
 se     Brigitte Canard, PA-C [address] [place], [place]  [address] Phone: [phone]   Primary Care Physician: Jerrlyn Morel, NP  Primary Gastroenterologist:  Brigitte Canard, PA-C / Alvester Johnson, MD   Chief Complaint:  F/U Abdominal Pain, GERD, and Chronic Constipation.       HPI:   Betty Cain is a 48 y.o. female who returns for followup of Chronic Upper Abdominal Pain, ongoing for many years.  She has had extensive GI evaluation which has been unrevealing.  We are using Arabic interpreter today.  She last saw Dr. General Kenner 02/2023 for followup.  He ordered HIDA Scan which was normal.  He suspected her chronic upper GI symptoms were due to functional dyspepsia potentially exacerbated by being on Ozempic  for 2 years.  He recommended PCP stop Ozempic  to see if upper GI symptoms improved.  He stopped Reglan  (not helping) and omeprazole .  Continued Pantoprazole  40mg  BID and Zofran  prn.  He added back Carafate  1 tab Q 8 hours and gave samples of FD Gard to use as needed.  Continued Miralax  for Constipation, which was working well.  Consider cymbalta  or other neuromodulator if no improvement.  HgbA1C 5.3; Diabetes under excellent control.  Current Symptoms: Persistent Lower pelvic pain with nausea which comes and goes.  She states she goes 2-3 days with no bowel movement.  Has episodes of constipation alternating with diarrhea.  She stopped taking Ozempic  6 months ago and was switched to metformin .  Upper GI symptoms have improved, however she is having increased lower GI symptoms for 3 or 4 months.  She was started on duloxetine  (Cymbalta ), which has helped, and needs refill.  GI History:  -01/2014: RUQ US  - Normal. -07/2017 Positive H. Pylori IgG serology Treated with Abx and PPI (Protonix ). -12/2017 EGD by Dr. General Kenner: Mild gastritis.  Unfortunately her procedure was aborted due to significant laryngospasm and oxygen desaturation. Biopsies were not able to be completed.  We  stopped her Protonix  and she submitted a stool study which was negative for H. Pylori.  -03/2018 Abd pelvic CT - Normal. -2020: Cardiac Evaluation for Chest Pain was unrevealing. -11/2018: Colonoscopy: Tortuous Colon.  One 3mm sessile serrated polyp removed.  Internal hemorrhoids.  5 year repeat (Due 11/2023).  Sleep Apnea suspected. -06/2019 RUQ US : Normal Gallbladder.  Hepatic stestosis. -12/2020:  C diff ova/parasites/stool culture Negative. -04/2021: Cardiac CT: Normal.  No CAD. -08/2021: EGD at Hospital: 1cm Hiatal Hernia.  Otherwise Normal.  Biopsies Negative for H. Pylori and Celiac. -07/2022: RUQ US  Unremarkable. -12/2022: CT Abdomen / pelvis with contrast:  Severe submucosal edema involving the entire gastric body and antrum. Findings most consistent with severe gastritis. -02/2023 HIDA Scan: Normal.  Gallbladder EF 47%.  Current Outpatient Medications  Medication Sig Dispense Refill   Accu-Chek Softclix Lancets lancets USE AS DIRECTED 100 each 0   acetaminophen  (TYLENOL ) 500 MG tablet Take 500 mg by mouth every 6 (six) hours as needed.     amoxicillin -clavulanate (AUGMENTIN ) 875-125 MG tablet Take 1 tablet by mouth 2 (two) times daily. 20 tablet 0   aspirin EC 81 MG tablet Take 81 mg by mouth daily. Swallow whole.     bisoprolol  (ZEBETA ) 5 MG tablet Take 1 tablet (5 mg total) by mouth 2 (two) times daily. 180 tablet 3   blood glucose meter kit and supplies KIT Dispense based on patient and insurance preference. Use up to four times daily as directed. 1 each 0   Blood Pressure Monitoring  DEVI 1 each by Does not apply route daily. 1 Device 0   cholecalciferol  (VITAMIN D3) 25 MCG (1000 UNIT) tablet Take 1,000 Units by mouth daily.     cloNIDine  (CATAPRES ) 0.1 MG tablet Take 1 tablet by mouth twice daily 60 tablet 0   diclofenac  Sodium (VOLTAREN  ARTHRITIS PAIN) 1 % GEL Apply 2 g topically 4 (four) times daily. 2 g 3   docusate sodium  (COLACE) 100 MG capsule Take 1 capsule (100 mg total) by  mouth 2 (two) times daily. 10 capsule 0   DULoxetine  (CYMBALTA ) 30 MG capsule Take 30 mg by mouth daily.     ferrous sulfate  (FEROSUL) 325 (65 FE) MG tablet TAKE 1 TABLET (325 MG TOTAL) BY MOUTH DAILY. 90 tablet 3   fluticasone  (FLONASE ) 50 MCG/ACT nasal spray Use 2 spray(s) in each nostril once daily 48 g 0   Fremanezumab -vfrm (AJOVY ) 225 MG/1.5ML SOAJ Inject 225 mg into the skin every 30 (thirty) days. 1.5 mL 11   glucose blood (ACCU-CHEK GUIDE) test strip Use as instructed 100 each 12   ibuprofen  (ADVIL ) 800 MG tablet Take 800 mg by mouth every 6 (six) hours as needed.     KLOR-CON  M20 20 MEQ tablet Take 1 tablet by mouth once daily 30 tablet 0   levocetirizine (XYZAL ) 5 MG tablet TAKE 1 TABLET BY MOUTH ONCE DAILY IN THE EVENING 30 tablet 0   lidocaine  (LIDODERM ) 5 % USE 1 PATCH EXTERNALLY ONCE DAILY. REMOVE AND DISCARD PATCH WITHIN 12 HOURS OR AS DIRECTED BY MD 30 patch 0   metFORMIN  (GLUCOPHAGE -XR) 500 MG 24 hr tablet Take 1 tab daily with largest meal TAKE WITH FOOD 90 tablet 0   methocarbamol  (ROBAXIN -750) 750 MG tablet Take 1 tablet (750 mg total) by mouth at bedtime as needed for muscle spasms. 90 tablet 3   methylcellulose (CITRUCEL) oral powder Take as directed, daily     Multiple Vitamin (MULTI VITAMIN DAILY PO) Take by mouth.     ondansetron  (ZOFRAN ) 4 MG tablet TAKE 1 TABLET BY MOUTH EVERY 8 HOURS AS NEEDED FOR NAUSEA FOR VOMITING 30 tablet 0   ondansetron  (ZOFRAN -ODT) 4 MG disintegrating tablet Take 1 tablet (4 mg total) by mouth every 8 (eight) hours as needed. 20 tablet 0   pregabalin  (LYRICA ) 50 MG capsule Take 1 capsule (50 mg total) by mouth at bedtime. 90 capsule 3   Rimegepant Sulfate  75 MG TBDP Take 1 tab at onset of migraine. May repeat in 2 hrs, if needed. Max dose: 2 tabs/day or 15/month. This is a 90 day rx. 45 tablet 3   rosuvastatin  (CRESTOR ) 5 MG tablet TAKE 1 TABLET BY MOUTH AT BEDTIME . APPOINTMENT REQUIRED FOR FUTURE REFILLS 30 tablet 0   sucralfate  (CARAFATE ) 1 g  tablet Take 1 tablet (1 g total) by mouth every 6 (six) hours as needed. Slowly dissolve 1 tablet in 1 tablespoon of distilled water prior to ingestion. 60 tablet 1   VENTOLIN  HFA 108 (90 Base) MCG/ACT inhaler INHALE 2 PUFFS BY MOUTH EVERY 6 HOURS AS NEEDED FOR WHEEZING FOR SHORTNESS OF BREATH 18 g 0   dicyclomine  (BENTYL ) 10 MG/5ML solution Take 5 mLs (10 mg total) by mouth 4 (four) times daily -  before meals and at bedtime. (Patient not taking: Reported on 04/22/2023) 600 mL 0   Current Facility-Administered Medications  Medication Dose Route Frequency Provider Last Rate Last Admin   ondansetron  (ZOFRAN -ODT) disintegrating tablet 4 mg  4 mg Oral Once  Allergies as of 08/23/2023 - Review Complete 08/23/2023  Allergen Reaction Noted   Seasonal ic [octacosanol]  10/22/2022   Topamax  [topiramate ] Itching and Rash 06/13/2018    Past Medical History:  Diagnosis Date   Allergy    Anemia    Anxiety 01/2019   Asthma    B12 deficiency    Back pain    Common migraine with intractable migraine 06/28/2017   Constipation    Diabetes (HCC) 02/2019   Fatigue    Fibromyalgia    GERD (gastroesophageal reflux disease)    pos H pylori   Hypertension    controlled with medication   IBS (irritable bowel syndrome) 2005   Joint pain    Neonatal death    Vaginal delivery, full term-lived x2 hours.    Palpitations    Shortness of breath    Shortness of breath on exertion    Spinal headache    Swelling of both lower extremities    Valvular heart disease    Vitamin D  deficiency 02/2019    Past Surgical History:  Procedure Laterality Date   ADENOIDECTOMY     and tonsils (as a child)   BIOPSY  08/27/2021   Procedure: BIOPSY;  Surgeon: Ace Holder, MD;  Location: [facility];  Service: Gastroenterology;;   boil  2003   right elbow   CESAREAN SECTION     x4   CESAREAN SECTION  06/02/2011   Procedure: CESAREAN SECTION;  Surgeon: Albino Hum, MD;  Location: [facility];   Service: Gynecology;  Laterality: N/A;  Primary Cesarean Section Delivery Baby Boy @ 0004, Apgars 9/9   CESAREAN SECTION N/A 12/10/2013   Procedure: REPEAT CESAREAN SECTION;  Surgeon: Verlyn Goad, MD;  Location: [facility];  Service: Obstetrics;  Laterality: N/A;   CESAREAN SECTION     colonoscopy  11/23/2018   stated had issues with sleep when in for procedure   ESOPHAGOGASTRODUODENOSCOPY (EGD) WITH PROPOFOL  N/A 08/27/2021   Procedure: ESOPHAGOGASTRODUODENOSCOPY (EGD) WITH PROPOFOL ;  Surgeon: Ace Holder, MD;  Location: [facility];  Service: Gastroenterology;  Laterality: N/A;   OTHER SURGICAL HISTORY  2005   Uterine surgery    uterine cauterization      Review of Systems:    All systems reviewed and negative except where noted in HPI.    Physical Exam:  BP 120/68   Pulse 73   Ht 4\' 10"  (1.473 m)   Wt 135 lb (61.2 kg)   SpO2 97%   BMI 28.22 kg/m  No LMP recorded. Patient is perimenopausal.  General: Well-nourished, well-developed in no acute distress.  Lungs: Clear to auscultation bilaterally. Non-labored. Heart: Regular rate and rhythm, no murmurs rubs or gallops.  Abdomen: Bowel sounds are normal; Abdomen is Soft; No hepatosplenomegaly, masses or hernias; mild generalized periumbilical and bilateral lower abdominal Tenderness; no upper abdominal tenderness.  No guarding or rebound tenderness. Neuro: Alert and oriented x 3.  Grossly intact.  Psych: Alert and cooperative, normal mood and affect.   Imaging Studies: No results found.  Labs: CBC    Component Value Date/Time   WBC 13.8 (H) 12/13/2022 1539   RBC 4.62 12/13/2022 1539   HGB 13.7 12/13/2022 1539   HGB 11.9 10/22/2022 0906   HCT 42.0 12/13/2022 1539   HCT 37.1 10/22/2022 0906   PLT 316 12/13/2022 1539   PLT 277 10/22/2022 0906   MCV 90.9 12/13/2022 1539   MCV 91 10/22/2022 0906   MCH 29.7 12/13/2022 1539   MCHC 32.6 12/13/2022  1539   RDW 12.6 12/13/2022 1539   RDW 12.8 10/22/2022 0906    LYMPHSABS 3.8 (H) 05/12/2021 1107   MONOABS 0.3 08/30/2018 1240   EOSABS 0.2 05/12/2021 1107   BASOSABS 0.1 05/12/2021 1107    CMP     Component Value Date/Time   NA 142 05/12/2023 0925   K 4.5 05/12/2023 0925   CL 102 05/12/2023 0925   CO2 21 05/12/2023 0925   GLUCOSE 77 05/12/2023 0925   GLUCOSE 100 (H) 12/13/2022 1539   GLUCOSE 77 09/21/2013 1230   BUN 13 05/12/2023 0925   CREATININE 0.78 05/12/2023 0925   CREATININE 0.58 09/14/2016 0939   CALCIUM  9.9 05/12/2023 0925   PROT 7.8 05/12/2023 0925   ALBUMIN 4.9 05/12/2023 0925   AST 22 05/12/2023 0925   ALT 17 05/12/2023 0925   ALKPHOS 88 05/12/2023 0925   BILITOT 0.5 05/12/2023 0925   GFRNONAA >60 12/13/2022 1539   GFRNONAA >89 09/14/2016 0939   GFRAA 126 01/02/2020 1027   GFRAA >89 09/14/2016 0939       Assessment and Plan:   Betty Cain is a 48 y.o. y/o female returns for followup of:  Chronic Upper Abdominal Pain: Extensive GI workup Unrevealing.  Likely due to Functional Dyspepsia, Gastritis, and adverse side effect of taking Ozempic . - Remain Off Ozempic  - Lab: Alpha Gal, Food Allergy Panal  2.  IBS with Diarrhea and consitpaiton -Start OTC Align Probiotic 1 daily. - Continue Citrucel Daily - Continue duloxetine  (Cymbalta ) 30 Mg once daily, #30, 5 refills.  3. Functional Dyspepsia - Continue Cymbalta  -Continue Zofran  prn Nausea  4. History of Gastritis -Continue Carafate  1g TID  5.  History of H. Pylori Treated in 2019.  EGD in 2023 showed biopsies negative for H. Pylori, confirming eradication. - Reassurance.  6.  History of Sessile Serrated Colon Polyp -Schedule 5 year repeat Colonoscopy will be due 11/2023 (In Hospital).  Brigitte Canard, PA-C  Follow up as needed based on above results and GI symptoms.

## 2023-08-23 ENCOUNTER — Telehealth: Payer: Self-pay

## 2023-08-23 ENCOUNTER — Other Ambulatory Visit

## 2023-08-23 ENCOUNTER — Ambulatory Visit: Admitting: Physician Assistant

## 2023-08-23 ENCOUNTER — Encounter: Payer: Self-pay | Admitting: Physician Assistant

## 2023-08-23 VITALS — BP 120/68 | HR 73 | Ht <= 58 in | Wt 135.0 lb

## 2023-08-23 DIAGNOSIS — Z860100 Personal history of colon polyps, unspecified: Secondary | ICD-10-CM

## 2023-08-23 DIAGNOSIS — Z8619 Personal history of other infectious and parasitic diseases: Secondary | ICD-10-CM

## 2023-08-23 DIAGNOSIS — R110 Nausea: Secondary | ICD-10-CM

## 2023-08-23 DIAGNOSIS — K582 Mixed irritable bowel syndrome: Secondary | ICD-10-CM

## 2023-08-23 DIAGNOSIS — G8929 Other chronic pain: Secondary | ICD-10-CM | POA: Diagnosis not present

## 2023-08-23 DIAGNOSIS — R109 Unspecified abdominal pain: Secondary | ICD-10-CM

## 2023-08-23 DIAGNOSIS — K30 Functional dyspepsia: Secondary | ICD-10-CM | POA: Diagnosis not present

## 2023-08-23 DIAGNOSIS — Z8719 Personal history of other diseases of the digestive system: Secondary | ICD-10-CM | POA: Diagnosis not present

## 2023-08-23 DIAGNOSIS — Z860101 Personal history of adenomatous and serrated colon polyps: Secondary | ICD-10-CM

## 2023-08-23 DIAGNOSIS — R1010 Upper abdominal pain, unspecified: Secondary | ICD-10-CM | POA: Diagnosis not present

## 2023-08-23 MED ORDER — DULOXETINE HCL 30 MG PO CPEP: 30.0000 mg | ORAL_CAPSULE | Freq: Every day | ORAL | 1 refills | Status: DC

## 2023-08-23 NOTE — Patient Instructions (Addendum)
  Start OTC Align Probiotic. Take 1 Capsule Once Daily for 30 days. If GI symptoms improve, then OK to continue Align. If GI symptoms do not improve after 30 days, then discontinue.   Your provider has requested that you go to the basement level for lab work before leaving today. Press "B" on the elevator. The lab is located at the first door on the left as you exit the elevator.  Please follow up sooner if symptoms increase or worsen  Due to recent changes in healthcare laws, you may see the results of your imaging and laboratory studies on MyChart before your provider has had a chance to review them.  We understand that in some cases there may be results that are confusing or concerning to you. Not all laboratory results come back in the same time frame and the provider may be waiting for multiple results in order to interpret others.  Please give us  48 hours in order for your provider to thoroughly review all the results before contacting the office for clarification of your results.   _______________________________________________________  If your blood pressure at your visit was 140/90 or greater, please contact your primary care physician to follow up on this.  _______________________________________________________  If you are age 84 or older, your body mass index should be between 23-30. Your Body mass index is 28.22 kg/m. If this is out of the aforementioned range listed, please consider follow up with your Primary Care Provider.  If you are age 31 or younger, your body mass index should be between 19-25. Your Body mass index is 28.22 kg/m. If this is out of the aformentioned range listed, please consider follow up with your Primary Care Provider.   ________________________________________________________  The [facility] providers would like to encourage you to use MYCHART to communicate with providers for non-urgent requests or questions.  Due to long hold times on the telephone,  sending your provider a message by [facility] may be a faster and more efficient way to get a response.  Please allow 48 business hours for a response.  Please remember that this is for non-urgent requests.  _______________________________________________________ Thank you for trusting me with your gastrointestinal care!   Brigitte Canard, PA

## 2023-08-23 NOTE — Progress Notes (Signed)
 Agree with assessment and plan as outlined.

## 2023-08-23 NOTE — Telephone Encounter (Signed)
 N/a

## 2023-08-24 ENCOUNTER — Encounter (INDEPENDENT_AMBULATORY_CARE_PROVIDER_SITE_OTHER): Payer: Self-pay | Admitting: Adult Health

## 2023-08-24 ENCOUNTER — Ambulatory Visit (INDEPENDENT_AMBULATORY_CARE_PROVIDER_SITE_OTHER): Admitting: Adult Health

## 2023-08-24 VITALS — BP 117/72 | HR 91 | Temp 99.0°F | Ht <= 58 in | Wt 131.0 lb

## 2023-08-24 DIAGNOSIS — E1159 Type 2 diabetes mellitus with other circulatory complications: Secondary | ICD-10-CM

## 2023-08-24 DIAGNOSIS — E1169 Type 2 diabetes mellitus with other specified complication: Secondary | ICD-10-CM | POA: Diagnosis not present

## 2023-08-24 DIAGNOSIS — E559 Vitamin D deficiency, unspecified: Secondary | ICD-10-CM

## 2023-08-24 DIAGNOSIS — R109 Unspecified abdominal pain: Secondary | ICD-10-CM

## 2023-08-24 DIAGNOSIS — Z6827 Body mass index (BMI) 27.0-27.9, adult: Secondary | ICD-10-CM

## 2023-08-24 DIAGNOSIS — Z7984 Long term (current) use of oral hypoglycemic drugs: Secondary | ICD-10-CM

## 2023-08-24 DIAGNOSIS — E669 Obesity, unspecified: Secondary | ICD-10-CM | POA: Diagnosis not present

## 2023-08-24 DIAGNOSIS — I152 Hypertension secondary to endocrine disorders: Secondary | ICD-10-CM

## 2023-08-24 DIAGNOSIS — Z6831 Body mass index (BMI) 31.0-31.9, adult: Secondary | ICD-10-CM

## 2023-08-24 DIAGNOSIS — E66811 Obesity, class 1: Secondary | ICD-10-CM

## 2023-08-24 NOTE — Progress Notes (Signed)
 WEIGHT SUMMARY AND BIOMETRICS  Vitals Temp: 99 F (37.2 C) BP: 117/72 Pulse Rate: 91 SpO2: 99 %   Anthropometric Measurements Height: 4\' 10"  (1.473 m) Weight: 131 lb (59.4 kg) BMI (Calculated): 27.39 Weight at Last Visit: 128 lb Weight Lost Since Last Visit: 0 Weight Gained Since Last Visit: 3 lb Starting Weight: 152 lb Total Weight Loss (lbs): 21 lb (9.526 kg)   Body Composition  Body Fat %: 39.1 % Fat Mass (lbs): 51.4 lbs Muscle Mass (lbs): 76 lbs Total Body Water (lbs): 56.2 lbs Visceral Fat Rating : 7   Other Clinical Data Fasting: No Labs: No Today's Visit #: 45 Starting Date: 01/10/20    Chief Complaint:   OBESITY Betty Cain is here to discuss her progress with her obesity treatment plan.  She is on the the Category 2 Plan and states she is following her eating plan approximately 75 % of the time.  She states she is exercising moving/stretching 5 minutes 7 times per week.  Interim History:  Reviewed Bioimpedance Results with pt: Muscle Mass: +2.2 lbs Adipose Mass: +1 lb  [place] Arabic Interpretor at [facility] during OV  Of note- Betty Cain started Maintenance Phase on/about 10/26/2022   03/02/2023 OV with Dr. Armbruster/GI Recommended to suspend GLP-1 therapy over concerns of gastroparesis    She stopped Ozempic  1mg - last dose 02/24/2023   Started on Metformin  XR 500 mg one daily on 03/07/2023  She had detailed GI f/u yesterday- reviewed notes with pt  Subjective:   1. Abdominal pain, unspecified abdominal location 08/23/2023 GI OV Notes HPI:   Betty Cain is a 48 y.o. female who returns for followup of Chronic Upper Abdominal Pain, ongoing for many years.  She has had extensive GI evaluation which has been unrevealing.  We are using Arabic interpreter today.   She last saw Dr. General Kenner 02/2023 for followup.  He ordered HIDA Scan which was normal.  He suspected her chronic upper GI symptoms were due to functional dyspepsia potentially  exacerbated by being on Ozempic  for 2 years.  He recommended PCP stop Ozempic  to see if upper GI symptoms improved.  He stopped Reglan  (not helping) and omeprazole .  Continued Pantoprazole  40mg  BID and Zofran  prn.  He added back Carafate  1 tab Q 8 hours and gave samples of FD Gard to use as needed.  Continued Miralax  for Constipation, which was working well.  Consider cymbalta  or other neuromodulator if no improvement.  HgbA1C 5.3; Diabetes under excellent control.   Current Symptoms: Persistent Lower pelvic pain with nausea which comes and goes.  She states she goes 2-3 days with no bowel movement.  Has episodes of constipation alternating with diarrhea.  She stopped taking Ozempic  6 months ago and was switched to metformin .  Upper GI symptoms have improved, however she is having increased lower GI symptoms for 3 or 4 months.  She was started on duloxetine  (Cymbalta ), which has helped, and needs refill.  Assessment and Plan:    Betty Cain is a 48 y.o. y/o female returns for followup of:   Chronic Upper Abdominal Pain: Extensive GI workup Unrevealing.  Likely due to Functional Dyspepsia, Gastritis, and adverse side effect of taking Ozempic . - Remain Off Ozempic  - Lab: Alpha Gal, Food Allergy Panal   2.  IBS with Diarrhea and consitpaiton -Start OTC Align Probiotic 1 daily. - Continue Citrucel Daily - Continue duloxetine  (Cymbalta ) 30 Mg once daily, #30, 5 refills.   3. Functional Dyspepsia - Continue Cymbalta  -Continue Zofran   prn Nausea   4. History of Gastritis -Continue Carafate  1g TID   5.  History of H. Pylori Treated in 2019.  EGD in 2023 showed biopsies negative for H. Pylori, confirming eradication. - Reassurance.   6.  History of Sessile Serrated Colon Polyp -Schedule 5 year repeat Colonoscopy will be due 11/2023 (In Hospital).   Betty Canard, PA-C   Follow up as needed based on above results and GI symptoms.      Of note- her last episode of N/V was 07/29/2023 She denies  overeating or consuming something unusual prior to emesis  2. Type 2 diabetes mellitus with obesity (HCC) Lab Results  Component Value Date   HGBA1C 5.4 07/21/2023   HGBA1C 5.3 05/12/2023   HGBA1C 5.4 04/22/2023    Home fasting CBG 99-119 She denies sx's of hypoglycemia She is on daily Metformin  500mg - denies loose stools  She stopped Ozempic  1mg - last dose 02/24/2023  Started on Metformin  XR 500 mg one daily on 03/07/2023  3. Hypertension associated with diabetes (HCC) BP excellent and at goal at OV She denies CP with exertion She is on aspirin EC 81 MG tablet  bisoprolol  (ZEBETA ) 5 MG tablet  cloNIDine  (CATAPRES ) 0.1 MG tablet  metFORMIN  (GLUCOPHAGE -XR) 500 MG 24 hr tablet  rosuvastatin  (CRESTOR ) 5 MG tablet   4. Vitamin D  deficiency She endorses stable energy levels  Assessment/Plan:   1. Abdominal pain, unspecified abdominal location (Primary) Start daily Align and remain all other GI medications I will consult with GI, re: safety of restarting Ozempic  therapy F/u with GI as directed  2. Type 2 diabetes mellitus with obesity (HCC) Check Labs - Hemoglobin A1c - Insulin , random - Vitamin B12  3. Hypertension associated with diabetes (HCC) Check Labs - Comprehensive metabolic panel with GFR  4. Vitamin D  deficiency Check Labs - VITAMIN D  25 Hydroxy (Vit-D Deficiency, Fractures)  5. Obesity, current BMI 27.5  Betty Cain is currently in the action stage of change. As such, her goal is to maintain weight for now. She has agreed to the Category 2 Plan.   Exercise goals: All adults should avoid inactivity. Some physical activity is better than none, and adults who participate in any amount of physical activity gain some health benefits. Adults should also include muscle-strengthening activities that involve all major muscle groups on 2 or more days a week.  Behavioral modification strategies: increasing lean protein intake, decreasing simple carbohydrates, increasing  vegetables, increasing water intake, meal planning and cooking strategies, keeping healthy foods in the home, ways to avoid boredom eating, and planning for success.  Betty Cain has agreed to follow-up with our clinic in 4 weeks. She was informed of the importance of frequent follow-up visits to maximize her success with intensive lifestyle modifications for her multiple health conditions.   Betty Cain was informed we would discuss her lab results at her next visit unless there is a critical issue that needs to be addressed sooner. Betty Cain agreed to keep her next visit at the agreed upon time to discuss these results.  Objective:   Blood pressure 117/72, pulse 91, temperature 99 F (37.2 C), height 4\' 10"  (1.473 m), weight 131 lb (59.4 kg), SpO2 99%. Body mass index is 27.38 kg/m.  General: Cooperative, alert, well developed, in no acute distress. HEENT: Conjunctivae and lids unremarkable. Cardiovascular: Regular rhythm.  Lungs: Normal work of breathing. Neurologic: No focal deficits.   Lab Results  Component Value Date   CREATININE 0.78 05/12/2023   BUN 13 05/12/2023   NA  142 05/12/2023   K 4.5 05/12/2023   CL 102 05/12/2023   CO2 21 05/12/2023   Lab Results  Component Value Date   ALT 17 05/12/2023   AST 22 05/12/2023   ALKPHOS 88 05/12/2023   BILITOT 0.5 05/12/2023   Lab Results  Component Value Date   HGBA1C 5.4 07/21/2023   HGBA1C 5.3 05/12/2023   HGBA1C 5.4 04/22/2023   HGBA1C 5.3 03/07/2023   HGBA1C 5.2 10/22/2022   Lab Results  Component Value Date   INSULIN  6.2 05/12/2023   INSULIN  6.5 03/07/2023   INSULIN  7.9 06/21/2022   INSULIN  3.8 12/28/2021   INSULIN  8.7 01/10/2020   Lab Results  Component Value Date   TSH 1.710 05/12/2023   Lab Results  Component Value Date   CHOL 144 03/07/2023   HDL 42 03/07/2023   LDLCALC 89 03/07/2023   TRIG 66 03/07/2023   CHOLHDL 3.4 03/07/2023   Lab Results  Component Value Date   VD25OH 45.8 05/12/2023   VD25OH 41.0 03/07/2023    VD25OH 51.2 06/21/2022   Lab Results  Component Value Date   WBC 13.8 (H) 12/13/2022   HGB 13.7 12/13/2022   HCT 42.0 12/13/2022   MCV 90.9 12/13/2022   PLT 316 12/13/2022   Lab Results  Component Value Date   IRON 53 10/22/2022   TIBC 253 10/22/2022   FERRITIN 295 (H) 10/22/2022   Attestation Statements:   Reviewed by clinician on day of visit: allergies, medications, problem list, medical history, surgical history, family history, social history, and previous encounter notes.  I have reviewed the above documentation for accuracy and completeness, and I agree with the above. -  Tonye Tancredi d. Tonantzin Mimnaugh, NP-C

## 2023-08-25 ENCOUNTER — Ambulatory Visit: Payer: Self-pay | Admitting: Physician Assistant

## 2023-08-26 LAB — COMPREHENSIVE METABOLIC PANEL WITH GFR
ALT: 20 IU/L (ref 0–32)
AST: 21 IU/L (ref 0–40)
Albumin: 4.4 g/dL (ref 3.9–4.9)
Alkaline Phosphatase: 78 IU/L (ref 44–121)
BUN/Creatinine Ratio: 17 (ref 9–23)
BUN: 10 mg/dL (ref 6–24)
Bilirubin Total: 0.4 mg/dL (ref 0.0–1.2)
CO2: 24 mmol/L (ref 20–29)
Calcium: 9.5 mg/dL (ref 8.7–10.2)
Chloride: 103 mmol/L (ref 96–106)
Creatinine, Ser: 0.58 mg/dL (ref 0.57–1.00)
Globulin, Total: 2.6 g/dL (ref 1.5–4.5)
Glucose: 82 mg/dL (ref 70–99)
Potassium: 4.5 mmol/L (ref 3.5–5.2)
Sodium: 144 mmol/L (ref 134–144)
Total Protein: 7 g/dL (ref 6.0–8.5)
eGFR: 114 mL/min/{1.73_m2} (ref >59)

## 2023-08-26 LAB — INSULIN, RANDOM: INSULIN: 6.2 u[IU]/mL (ref 2.6–24.9)

## 2023-08-26 LAB — HEMOGLOBIN A1C
Est. average glucose Bld gHb Est-mCnc: 117 mg/dL
Hgb A1c MFr Bld: 5.7 % — ABNORMAL HIGH (ref 4.8–5.6)

## 2023-08-26 LAB — VITAMIN D 25 HYDROXY (VIT D DEFICIENCY, FRACTURES): Vit D, 25-Hydroxy: 35.3 ng/mL (ref 30.0–100.0)

## 2023-08-26 LAB — VITAMIN B12: Vitamin B-12: 457 pg/mL (ref 232–1245)

## 2023-08-27 LAB — ALPHA-GAL PANEL
Allergen, Mutton, f88: <0.1 kU/L
Allergen, Pork, f26: <0.1 kU/L
Beef: <0.1 kU/L
CLASS: 0
CLASS: 0
Class: 0
GALACTOSE-ALPHA-1,3-GALACTOSE IGE*: <0.1 kU/L (ref <0.10)

## 2023-08-27 LAB — FOOD ALLERGY PROFILE

## 2023-08-27 LAB — INTERPRETATION:

## 2023-08-30 ENCOUNTER — Other Ambulatory Visit: Payer: Self-pay | Admitting: Nurse Practitioner

## 2023-08-30 NOTE — Telephone Encounter (Signed)
 Please advise [facility]

## 2023-09-02 ENCOUNTER — Telehealth: Payer: Self-pay

## 2023-09-02 ENCOUNTER — Other Ambulatory Visit (HOSPITAL_COMMUNITY): Payer: Self-pay

## 2023-09-02 NOTE — Telephone Encounter (Signed)
 Pharmacy Patient Advocate Encounter  Received notification from [facility] that Prior Authorization for AJOVY  (fremanezumab -vfrm) injection 225MG /1.5ML auto-injectors has been APPROVED from 09/02/2023 to 09/01/2024. Ran test claim, Copay is $4.00. This test claim was processed through [facility]- copay amounts may vary at other pharmacies due to pharmacy/plan contracts, or as the patient moves through the different stages of their insurance plan.   PA #/Case ID/Reference #: PA Case ID #: [id number]

## 2023-09-04 ENCOUNTER — Other Ambulatory Visit: Payer: Self-pay | Admitting: Nurse Practitioner

## 2023-09-04 DIAGNOSIS — E1140 Type 2 diabetes mellitus with diabetic neuropathy, unspecified: Secondary | ICD-10-CM

## 2023-09-12 ENCOUNTER — Other Ambulatory Visit: Payer: Self-pay

## 2023-09-12 MED ORDER — LIDOCAINE 5 % EX PTCH: 1.0000 | MEDICATED_PATCH | CUTANEOUS | 0 refills | Status: DC

## 2023-09-13 ENCOUNTER — Encounter: Payer: Self-pay | Admitting: Physical Medicine and Rehabilitation

## 2023-09-13 ENCOUNTER — Encounter: Attending: Physical Medicine and Rehabilitation | Admitting: Physical Medicine and Rehabilitation

## 2023-09-13 VITALS — BP 104/68 | HR 91 | Ht <= 58 in | Wt 130.0 lb

## 2023-09-13 DIAGNOSIS — M7918 Myalgia, other site: Secondary | ICD-10-CM | POA: Diagnosis not present

## 2023-09-13 MED ORDER — LIDOCAINE HCL 1 % IJ SOLN
5.0000 mL | Freq: Once | INTRAMUSCULAR | Status: AC
  Administered 2023-09-13: 5 mL via INTRADERMAL

## 2023-09-13 MED ORDER — TOPIRAMATE 25 MG PO TABS: 25.0000 mg | ORAL_TABLET | Freq: Every evening | ORAL | 3 refills | Status: AC

## 2023-09-13 NOTE — Progress Notes (Signed)
 Trigger Point Injection  Indication: Cervical myofascial pain not relieved by medication management and other conservative care.  Informed consent was obtained after describing risk and benefits of the procedure with the patient, this includes bleeding, bruising, infection and medication side effects.  The patient wishes to proceed and has given written consent.  The patient was placed in a seated position.  The area of pain was marked and prepped with Betadine.  It was entered with a 25-gauge 1/2 inch needle and a total of 5 mL of 1% lidocaine and normal saline was injected into a total of 4 trigger points, after negative draw back for blood.  The patient tolerated the procedure well.  Post procedure instructions were given.

## 2023-09-15 ENCOUNTER — Other Ambulatory Visit: Payer: Self-pay | Admitting: Nurse Practitioner

## 2023-09-22 ENCOUNTER — Other Ambulatory Visit: Payer: Self-pay | Admitting: Nurse Practitioner

## 2023-09-22 DIAGNOSIS — Z0000 Encounter for general adult medical examination without abnormal findings: Secondary | ICD-10-CM

## 2023-09-24 ENCOUNTER — Other Ambulatory Visit: Payer: Self-pay | Admitting: Nurse Practitioner

## 2023-09-26 ENCOUNTER — Other Ambulatory Visit: Payer: Self-pay | Admitting: Gastroenterology

## 2023-09-26 ENCOUNTER — Other Ambulatory Visit: Payer: Self-pay | Admitting: Nurse Practitioner

## 2023-09-26 NOTE — Telephone Encounter (Signed)
 Please advise [facility]

## 2023-10-06 ENCOUNTER — Encounter (INDEPENDENT_AMBULATORY_CARE_PROVIDER_SITE_OTHER): Payer: Self-pay | Admitting: Adult Health

## 2023-10-06 ENCOUNTER — Ambulatory Visit (INDEPENDENT_AMBULATORY_CARE_PROVIDER_SITE_OTHER): Admitting: Adult Health

## 2023-10-06 ENCOUNTER — Other Ambulatory Visit: Payer: Self-pay | Admitting: Nurse Practitioner

## 2023-10-06 VITALS — BP 104/70 | HR 79 | Temp 98.0°F | Ht <= 58 in | Wt 132.0 lb

## 2023-10-06 DIAGNOSIS — E1169 Type 2 diabetes mellitus with other specified complication: Secondary | ICD-10-CM | POA: Diagnosis not present

## 2023-10-06 DIAGNOSIS — E1159 Type 2 diabetes mellitus with other circulatory complications: Secondary | ICD-10-CM | POA: Diagnosis not present

## 2023-10-06 DIAGNOSIS — Z6826 Body mass index (BMI) 26.0-26.9, adult: Secondary | ICD-10-CM

## 2023-10-06 DIAGNOSIS — R109 Unspecified abdominal pain: Secondary | ICD-10-CM | POA: Diagnosis not present

## 2023-10-06 DIAGNOSIS — E66811 Obesity, class 1: Secondary | ICD-10-CM

## 2023-10-06 DIAGNOSIS — E669 Obesity, unspecified: Secondary | ICD-10-CM | POA: Diagnosis not present

## 2023-10-06 DIAGNOSIS — Z7984 Long term (current) use of oral hypoglycemic drugs: Secondary | ICD-10-CM

## 2023-10-06 DIAGNOSIS — I152 Hypertension secondary to endocrine disorders: Secondary | ICD-10-CM | POA: Diagnosis not present

## 2023-10-06 DIAGNOSIS — E1140 Type 2 diabetes mellitus with diabetic neuropathy, unspecified: Secondary | ICD-10-CM

## 2023-10-06 DIAGNOSIS — E559 Vitamin D deficiency, unspecified: Secondary | ICD-10-CM

## 2023-10-06 MED ORDER — VITAMIN D (ERGOCALCIFEROL) 1.25 MG (50000 UNIT) PO CAPS: 50000.0000 [IU] | ORAL_CAPSULE | ORAL | 0 refills | Status: DC

## 2023-10-06 MED ORDER — METFORMIN HCL ER 500 MG PO TB24: ORAL_TABLET | ORAL | 0 refills | Status: DC

## 2023-10-06 NOTE — Progress Notes (Signed)
 WEIGHT SUMMARY AND BIOMETRICS  Vitals Temp: 98 F (36.7 C) BP: 104/70 Pulse Rate: 79 SpO2: 100 %   Anthropometric Measurements Height: 4' 10 (1.473 m) Weight: 132 lb (59.9 kg) BMI (Calculated): 27.6 Weight at Last Visit: 131lb Weight Lost Since Last Visit: 0lb Weight Gained Since Last Visit: 1lb Starting Weight: 152lb Total Weight Loss (lbs): 20 lb (9.072 kg)   Body Composition  Body Fat %: 39.4 % Fat Mass (lbs): 52.2 lbs Muscle Mass (lbs): 76.2 lbs Total Body Water (lbs): 56.6 lbs Visceral Fat Rating : 8   Other Clinical Data Fasting: No Labs: no Today's Visit #: 44 Starting Date: 01/10/20    Chief Complaint:   OBESITY Betty Cain is here to discuss her progress with her obesity treatment plan.  She is on the the Category 2 Plan and states she is following her eating plan approximately 70 % of the time.  She states she is exercising Zumba 90 minutes 1 times per week.  Interim History:  [facility] Classes are weekly and 90 mins in duration. She shares that there is core strengthening during class, however she feels that her abdomin in expanding.  Discussed adding in twice weekly beginner abdominal strengthening exercises via YouTube  Of note- [place] Interpretor at [facility]  Subjective:   1. Vitamin D  deficiency Discussed Labs  Latest Reference Range & Units 08/24/23 12:48  Vitamin D , 25-Hydroxy 30.0 - 100.0 ng/mL 35.3       Vit D Level low normal, well below goal of 50-70 Multiple Vitamin (MULTI VITAMIN DAILY PO)  She endorses persistent fatigue  2. Abdominal pain, unspecified abdominal location  Latest Reference Range & Units 08/23/23 10:23  Walnut kU/L <0.10  Hazelnut kU/L <0.10  [place] Nut kU/L <0.10  Egg White IgE kU/L <0.10  Milk IgE kU/L <0.10  Peanut IgE kU/L <0.10  Fish Cod kU/L <0.10  Wheat IgE kU/L <0.10  Soybean IgE kU/L <0.10  Almonds kU/L <0.10  Shrimp IgE kU/L <0.10  Tuna IgE kU/L  <0.10  Cashew IgE kU/L <0.10  Scallop IgE kU/L <0.10  Macadamia Nut  kU/L <0.10  Sesame Seed IgE kU/L <0.10   She continues to experience intermittent abdominal pain with N/V She has found that she cannot consume fresh pineapple, only canned pineapple   3. Type 2 diabetes mellitus with obesity (HCC) Discussed Labs  Latest Reference Range & Units 08/24/23 12:48  Glucose 70 - 99 mg/dL 82  Hemoglobin J8R 4.8 - 5.6 % 5.7 (H)  Est. average glucose Bld gHb Est-mCnc mg/dL 882  INSULIN  2.6 - 24.9 uIU/mL 6.2  (H): Data is abnormally high Vitamin B12 232 - 1,245 pg/mL 457    Latest Reference Range & Units 08/24/23 12:48  eGFR >59 mL/min/1.73 114   She is on daily Metformin  XR 500mg - has been challenged to remember to take daily Her fasting CBG will range from 90-130s, few elevated readings 140-160 (when she forgets daily Metformin  XR)  CBG A1c, and [place] levels all at goal  Emphasized the importance of daily compliance with Metformin   4. Hypertension associated with diabetes (HCC) Discussed Labs 08/24/2023 CMP: Electroyes, Liver Enzymes, and Kidney Fx- stable She denies CP with exertion She is currently on: aspirin EC 81 MG tablet  bisoprolol  (ZEBETA ) 5 MG tablet  cloNIDine  (CATAPRES ) 0.1 MG tablet  rosuvastatin  (CRESTOR ) 5 MG tablet  metFORMIN  (GLUCOPHAGE -XR) 500 MG 24 hr tablet   Assessment/Plan:   1. Vitamin D  deficiency  Stop daily OTC Vit D 3 1,000 2 tabs Continue daily OTC MVI Start   Vitamin D , Ergocalciferol , (DRISDOL ) 1.25 MG (50000 UNIT) CAPS capsule Take 1 capsule (50,000 Units total) by mouth every 7 (seven) days. Dispense: 4 capsule, Refills: 0 ordered    2. Abdominal pain, unspecified abdominal location F/u with GI PRN  3. Type 2 diabetes mellitus with obesity (HCC) Refill  metFORMIN  (GLUCOPHAGE -XR) 500 MG 24 hr tablet Take 1 tab daily with largest meal TAKE WITH FOOD Dispense: 90 tablet, Refills: 0 ordered   4. Hypertension associated with diabetes  (HCC) Continue aspirin EC 81 MG tablet  bisoprolol  (ZEBETA ) 5 MG tablet  cloNIDine  (CATAPRES ) 0.1 MG tablet  rosuvastatin  (CRESTOR ) 5 MG tablet  metFORMIN  (GLUCOPHAGE -XR) 500 MG 24 hr tablet   5. Obesity, current BMI 26.76 (Primary)  Betty Cain is currently in the action stage of change. As such, her goal is to continue with weight loss efforts. She has agreed to the Category 2 Plan.   Exercise goals: For substantial health benefits, adults should do at least 150 minutes (2 hours and 30 minutes) a week of moderate-intensity, or 75 minutes (1 hour and 15 minutes) a week of vigorous-intensity aerobic physical activity, or an equivalent combination of moderate- and vigorous-intensity aerobic activity. Aerobic activity should be performed in episodes of at least 10 minutes, and preferably, it should be spread throughout the week.  Behavioral modification strategies: increasing lean protein intake, decreasing simple carbohydrates, increasing vegetables, increasing water intake, no skipping meals, meal planning and cooking strategies, keeping healthy foods in the home, and planning for success.  Betty Cain has agreed to follow-up with our clinic in 4 weeks. She was informed of the importance of frequent follow-up visits to maximize her success with intensive lifestyle modifications for her multiple health conditions.   Objective:   Blood pressure 104/70, pulse 79, temperature 98 F (36.7 C), height 4' 10 (1.473 m), weight 132 lb (59.9 kg), SpO2 100%. Body mass index is 27.59 kg/m.  General: Cooperative, alert, well developed, in no acute distress. HEENT: Conjunctivae and lids unremarkable. Cardiovascular: Regular rhythm.  Lungs: Normal work of breathing. Neurologic: No focal deficits.   Lab Results  Component Value Date   CREATININE 0.58 08/24/2023   BUN 10 08/24/2023   NA 144 08/24/2023   K 4.5 08/24/2023   CL 103 08/24/2023   CO2 24 08/24/2023   Lab Results  Component Value Date   ALT 20  08/24/2023   AST 21 08/24/2023   ALKPHOS 78 08/24/2023   BILITOT 0.4 08/24/2023   Lab Results  Component Value Date   HGBA1C 5.7 (H) 08/24/2023   HGBA1C 5.4 07/21/2023   HGBA1C 5.3 05/12/2023   HGBA1C 5.4 04/22/2023   HGBA1C 5.3 03/07/2023   Lab Results  Component Value Date   INSULIN  6.2 08/24/2023   INSULIN  6.2 05/12/2023   INSULIN  6.5 03/07/2023   INSULIN  7.9 06/21/2022   INSULIN  3.8 12/28/2021   Lab Results  Component Value Date   TSH 1.710 05/12/2023   Lab Results  Component Value Date   CHOL 144 03/07/2023   HDL 42 03/07/2023   LDLCALC 89 03/07/2023   TRIG 66 03/07/2023   CHOLHDL 3.4 03/07/2023   Lab Results  Component Value Date   VD25OH 35.3 08/24/2023   VD25OH 45.8 05/12/2023   VD25OH 41.0 03/07/2023   Lab Results  Component Value Date   WBC 13.8 (H) 12/13/2022   HGB 13.7 12/13/2022   HCT 42.0 12/13/2022   MCV 90.9  12/13/2022   PLT 316 12/13/2022   Lab Results  Component Value Date   IRON 53 10/22/2022   TIBC 253 10/22/2022   FERRITIN 295 (H) 10/22/2022   Attestation Statements:   Reviewed by clinician on day of visit: allergies, medications, problem list, medical history, surgical history, family history, social history, and previous encounter notes.  I have reviewed the above documentation for accuracy and completeness, and I agree with the above. -  Isis Costanza d. Jermery Caratachea, NP-C

## 2023-10-07 ENCOUNTER — Other Ambulatory Visit: Payer: Self-pay | Admitting: Physical Medicine and Rehabilitation

## 2023-10-07 DIAGNOSIS — M545 Low back pain, unspecified: Secondary | ICD-10-CM

## 2023-10-15 ENCOUNTER — Other Ambulatory Visit: Payer: Self-pay | Admitting: Nurse Practitioner

## 2023-10-20 ENCOUNTER — Encounter: Payer: Self-pay | Admitting: Nurse Practitioner

## 2023-10-20 ENCOUNTER — Ambulatory Visit: Payer: Self-pay | Admitting: Nurse Practitioner

## 2023-10-20 VITALS — BP 97/66 | HR 91 | Temp 98.4°F | Wt 138.8 lb

## 2023-10-20 DIAGNOSIS — I152 Hypertension secondary to endocrine disorders: Secondary | ICD-10-CM

## 2023-10-20 DIAGNOSIS — R5383 Other fatigue: Secondary | ICD-10-CM

## 2023-10-20 DIAGNOSIS — E1159 Type 2 diabetes mellitus with other circulatory complications: Secondary | ICD-10-CM | POA: Diagnosis not present

## 2023-10-20 LAB — POCT URINE PREGNANCY: Preg Test, Ur: NEGATIVE

## 2023-10-20 MED ORDER — VITAMIN D (ERGOCALCIFEROL) 1.25 MG (50000 UNIT) PO CAPS: 50000.0000 [IU] | ORAL_CAPSULE | ORAL | 0 refills | Status: DC

## 2023-10-20 MED ORDER — ALIGN EXTRA STRENGTH PO CAPS: 1.0000 | ORAL_CAPSULE | Freq: Every day | ORAL | 2 refills | Status: AC

## 2023-10-20 NOTE — Patient Instructions (Signed)
 1. Hypertension associated with diabetes (HCC)  - Urine Albumin/Creatinine with ratio (send out) [LAB689]  2. Other fatigue (Primary)  - POCT urine pregnancy

## 2023-10-20 NOTE — Progress Notes (Signed)
 Subjective   Patient ID: Betty Cain, female    DOB: Jul 24, 1975, 48 y.o.   MRN: [id number]  Chief Complaint  Patient presents with   Medical Management of Chronic Issues    Follow up    Referring provider: Oley Bascom RAMAN, NP  Betty Cain is a 48 y.o. 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  No date: Palpitations No date: Shortness of breath No date: Shortness of breath on exertion No date: Spinal headache No date: Swelling of both lower extremities No date: Valvular heart disease 02/2019: Vitamin D  deficiency   HPI  Patient presents today for follow-up visit. Recent A1C was 5.4. feeling better. Patient is following with pain management. States that it is helping. Also following with weight loss clinic. Patient is having episodes of heart racing. Discussed that she does need to speak with her cardiologist about this. Denies f/c/s, n/v/d, hemoptysis, PND, leg swelling. Denies chest pain or edema.     Allergies  Allergen Reactions   Seasonal Ic [Octacosanol]    Topamax  [Topiramate ] Itching and Rash    Immunization History  Administered Date(s) Administered   Influenza,inj,Quad PF,6+ Mos 06/19/2013, 12/02/2015   PFIZER(Purple Top)SARS-COV-2 Vaccination 07/07/2019, 07/28/2019   Pneumococcal Conjugate-13 05/21/2019   Tdap 09/13/2013, 12/11/2013    Tobacco History: Social History   Tobacco Use  Smoking Status Never  Smokeless Tobacco Never    Counseling given: Not Answered   Outpatient Encounter Medications as of 10/20/2023  Medication Sig   Accu-Chek Softclix Lancets lancets USE AS DIRECTED   acetaminophen  (TYLENOL ) 500 MG tablet Take 500 mg by mouth every 6 (six) hours as needed.   aspirin EC 81 MG tablet Take 81 mg by mouth daily. Swallow whole.   bisoprolol  (ZEBETA ) 5 MG tablet Take 1 tablet (5 mg total) by mouth 2 (two) times daily.   blood glucose meter kit and supplies KIT Dispense based on patient and insurance preference. Use up to four times daily as directed.   Blood Pressure Monitoring DEVI 1 each by Does not apply route daily.   cloNIDine  (CATAPRES ) 0.1 MG tablet Take 1 tablet by mouth twice daily   diclofenac  Sodium (VOLTAREN  ARTHRITIS PAIN) 1 % GEL Apply 2 g topically 4 (four) times daily.   docusate sodium  (COLACE) 100 MG capsule Take 1 capsule (100 mg total) by mouth 2 (two) times daily.   DULoxetine  (CYMBALTA ) 30 MG capsule Take 1 capsule (30 mg total) by mouth daily.   ferrous sulfate  (FEROSUL) 325 (65 FE) MG tablet TAKE 1 TABLET (325 MG TOTAL) BY MOUTH DAILY.   fluticasone  (FLONASE ) 50 MCG/ACT nasal spray Use 2 spray(s) in each nostril once daily   Fremanezumab -vfrm (AJOVY ) 225 MG/1.5ML SOAJ Inject 225 mg into the skin every 30 (thirty) days.   glucose blood (ACCU-CHEK GUIDE) test strip Use as instructed   ibuprofen  (ADVIL ) 800 MG tablet Take 800 mg by mouth every 6 (six) hours as needed.   KLOR-CON  M20 20 MEQ tablet Take 1 tablet by mouth  once daily   levocetirizine (XYZAL ) 5 MG tablet TAKE 1 TABLET BY MOUTH ONCE DAILY IN THE EVENING   lidocaine  (LIDODERM ) 5 % USE 1 PATCH EXTERNALLY ONCE DAILY REMOVE  AND  DISCARD  PATCH  WITHIN  12  HOURS  OR  AS  DIRECTED  BY  MD   metFORMIN  (GLUCOPHAGE -XR) 500 MG 24 hr tablet Take 1 tab daily with largest meal TAKE WITH FOOD   methocarbamol  (ROBAXIN -750) 750 MG tablet Take 1 tablet (750 mg total) by mouth at bedtime as needed for muscle spasms.   methylcellulose  (CITRUCEL) oral powder Take as directed, daily   Multiple Vitamin (MULTI VITAMIN DAILY PO) Take by mouth.   ondansetron  (ZOFRAN ) 4 MG tablet TAKE 1 TABLET BY MOUTH EVERY 8 HOURS AS NEEDED FOR NAUSEA FOR VOMITING   ondansetron  (ZOFRAN -ODT) 4 MG disintegrating tablet DISSOLVE 1 TABLET IN MOUTH EVERY 8 HOURS AS NEEDED   pantoprazole  (PROTONIX ) 40 MG tablet Take 40 mg by mouth 2 (two) times daily.   pregabalin  (LYRICA ) 50 MG capsule Take 1 capsule (50 mg total) by mouth at bedtime.   Probiotic Product (ALIGN EXTRA STRENGTH) CAPS Take 1 capsule by mouth daily.   Rimegepant Sulfate  75 MG TBDP Take 1 tab at onset of migraine. May repeat in 2 hrs, if needed. Max dose: 2 tabs/day or 15/month. This is a 90 day rx.   rosuvastatin  (CRESTOR ) 5 MG tablet TAKE 1 TABLET BY MOUTH AT BEDTIME . APPOINTMENT REQUIRED FOR FUTURE REFILLS   sucralfate  (CARAFATE ) 1 g tablet Take 1 tablet (1 g total) by mouth in the morning, at noon, and at bedtime. SLOWLY DISSOLVE 1 TABLET IN 1 TABLETSPOON OF DISTILLED WATER PRIOR TO INGESTION. Take three times daily   topiramate  (TOPAMAX ) 25 MG tablet Take 1 tablet (25 mg total) by mouth at bedtime.   VENTOLIN  HFA 108 (90 Base) MCG/ACT inhaler INHALE 2 PUFFS BY MOUTH EVERY 6 HOURS AS NEEDED FOR WHEEZING OR SHORTNESS OF BREATH   [DISCONTINUED] Vitamin D , Ergocalciferol , (DRISDOL ) 1.25 MG (50000 UNIT) CAPS capsule Take 1 capsule (50,000 Units total) by mouth every 7 (seven) days.   amoxicillin -clavulanate (AUGMENTIN ) 875-125 MG tablet Take 1 tablet by mouth 2 (two) times daily.   Vitamin D , Ergocalciferol , (DRISDOL ) 1.25 MG (50000 UNIT) CAPS capsule Take 1 capsule (50,000 Units total) by mouth every 7 (seven) days.   Facility-Administered Encounter Medications as of 10/20/2023  Medication   ondansetron  (ZOFRAN -ODT) disintegrating tablet 4 mg    Review of Systems  Review of Systems  Constitutional: Negative.   HENT: Negative.    Cardiovascular: Negative.   Gastrointestinal: Negative.    Allergic/Immunologic: Negative.   Neurological: Negative.   Psychiatric/Behavioral: Negative.       Objective:   BP 97/66   Pulse 91   Temp 98.4 F (36.9 C) (Oral)   Wt 138 lb 12.8 oz (63 kg)   SpO2 100%   BMI 29.01 kg/m   Wt Readings from Last 5 Encounters:  10/20/23 138 lb 12.8 oz (63 kg)  10/06/23 132 lb (59.9 kg)  09/13/23 130 lb (59 kg)  08/24/23 131 lb (59.4 kg)  08/23/23 135 lb (61.2 kg)     Physical Exam Vitals and nursing note reviewed.  Constitutional:      General: She is not in acute distress.    Appearance: She is well-developed.  Cardiovascular:     Rate and Rhythm: Normal rate and regular rhythm.  Pulmonary:     Effort: Pulmonary effort is normal.  Breath sounds: Normal breath sounds.  Neurological:     Mental Status: She is alert and oriented to person, place, and time.       Assessment & Plan:   Other fatigue -     POCT urine pregnancy  Hypertension associated with diabetes (HCC) -     Microalbumin / creatinine urine ratio  Other orders -     Vitamin D  (Ergocalciferol ); Take 1 capsule (50,000 Units total) by mouth every 7 (seven) days.  Dispense: 4 capsule; Refill: 0 -     Clinical research associate; Take 1 capsule by mouth daily.  Dispense: 90 capsule; Refill: 2     Return in about 6 months (around 04/21/2024).   Bascom GORMAN Borer, NP 10/20/2023

## 2023-10-21 LAB — MICROALBUMIN / CREATININE URINE RATIO
Creatinine, Urine: 98.1 mg/dL
Microalb/Creat Ratio: 11 mg/g{creat} (ref 0–29)
Microalbumin, Urine: 10.6 ug/mL

## 2023-10-31 ENCOUNTER — Other Ambulatory Visit: Payer: Self-pay | Admitting: Nurse Practitioner

## 2023-10-31 DIAGNOSIS — Z0000 Encounter for general adult medical examination without abnormal findings: Secondary | ICD-10-CM

## 2023-11-02 ENCOUNTER — Telehealth: Payer: Self-pay | Admitting: Nurse Practitioner

## 2023-11-02 NOTE — Telephone Encounter (Signed)
 LVM for call back. KH

## 2023-11-02 NOTE — Telephone Encounter (Signed)
 Copied from CRM [id number]. Topic: General - Other >> Nov 02, 2023 11:34 AM Tiffini S wrote: Reason for CRM: Patient called about Vitamin D , Ergocalciferol , (DRISDOL ) 1.25 MG (50000 UNIT) CAPS capsule Patient is asking that the doctor re prescribe the medicine without being gelatin coated/    Patient wants to return the Vitamin D  capsules back to the pharmacy- she have questions and would like to talk with a clinical nurse at [phone]   Patient is requesting a particular female interpreter: Laymon Dine work [phone] cell [phone] to be with her at all appointments starting with 11/03/23 at 9:00am    Patient states that her device Accu- Chek Guide for the blood sugar is broken/ that she has communicated this information to the nurse at the clinic and have not been able to check the blood sugar: Last checked two to three weeks ago  Please send to: [facility] [address]     Spoke with Arabic IntBETHA Raspberry ID: [id number] for this call

## 2023-11-02 NOTE — Telephone Encounter (Unsigned)
 Copied from CRM [id number]. Topic: General - Other >> Nov 02, 2023 11:34 AM Tiffini S wrote: Reason for CRM: Patient called about Vitamin D , Ergocalciferol , (DRISDOL ) 1.25 MG (50000 UNIT) CAPS capsule Patient is asking that the doctor re prescribe the medicine without being gelatin coated/    Patient wants to return the Vitamin D  capsules back to the pharmacy- she have questions and would like to talk with a clinical nurse at [phone]   Patient is requesting a particular female interpreter: Laymon Dine work [phone] cell [phone] to be with her at all appointments starting with 11/03/23 at 9:00am    Patient states that her device Accu- Chek Guide for the blood sugar is broken/ that she has communicated this information to the nurse at the clinic and have not been able to check the blood sugar: Last checked two to three weeks ago  Please send to: [facility] [address]     Spoke with Arabic IntBETHA Raspberry ID: [id number] for this call

## 2023-11-03 ENCOUNTER — Ambulatory Visit (INDEPENDENT_AMBULATORY_CARE_PROVIDER_SITE_OTHER): Admitting: Adult Health

## 2023-11-03 ENCOUNTER — Encounter (INDEPENDENT_AMBULATORY_CARE_PROVIDER_SITE_OTHER): Payer: Self-pay | Admitting: Adult Health

## 2023-11-03 ENCOUNTER — Other Ambulatory Visit (HOSPITAL_COMMUNITY): Payer: Self-pay

## 2023-11-03 VITALS — BP 103/71 | HR 84 | Temp 98.6°F | Ht <= 58 in | Wt 136.0 lb

## 2023-11-03 DIAGNOSIS — Z6828 Body mass index (BMI) 28.0-28.9, adult: Secondary | ICD-10-CM | POA: Diagnosis not present

## 2023-11-03 DIAGNOSIS — E559 Vitamin D deficiency, unspecified: Secondary | ICD-10-CM

## 2023-11-03 DIAGNOSIS — Z6831 Body mass index (BMI) 31.0-31.9, adult: Secondary | ICD-10-CM

## 2023-11-03 DIAGNOSIS — R109 Unspecified abdominal pain: Secondary | ICD-10-CM

## 2023-11-03 DIAGNOSIS — R5383 Other fatigue: Secondary | ICD-10-CM

## 2023-11-03 DIAGNOSIS — E1169 Type 2 diabetes mellitus with other specified complication: Secondary | ICD-10-CM

## 2023-11-03 DIAGNOSIS — Z7984 Long term (current) use of oral hypoglycemic drugs: Secondary | ICD-10-CM | POA: Diagnosis not present

## 2023-11-03 DIAGNOSIS — E669 Obesity, unspecified: Secondary | ICD-10-CM | POA: Diagnosis not present

## 2023-11-03 MED ORDER — VITAMIN D3 125 MCG (5000 UT) PO CAPS: 5000.0000 [IU] | ORAL_CAPSULE | Freq: Every day | ORAL | 0 refills | Status: DC

## 2023-11-03 MED ORDER — METFORMIN HCL ER 500 MG PO TB24
ORAL_TABLET | ORAL | 0 refills | Status: DC
Start: 2023-11-03 — End: 2023-12-05

## 2023-11-03 NOTE — Progress Notes (Signed)
 WEIGHT SUMMARY AND BIOMETRICS  Vitals Temp: 98.6 F (37 C) BP: 103/71 Pulse Rate: 84 SpO2: 97 %   Anthropometric Measurements Height: 4' 10 (1.473 m) Weight: 136 lb (61.7 kg) BMI (Calculated): 28.43 Weight at Last Visit: 132 lb Weight Lost Since Last Visit: 0 lb Weight Gained Since Last Visit: 4 lb Starting Weight: 152 lb Total Weight Loss (lbs): 16 lb (7.258 kg)   Body Composition  Body Fat %: 39.2 % Fat Mass (lbs): 53.6 lbs Muscle Mass (lbs): 78.8 lbs Total Body Water (lbs): 56.4 lbs Visceral Fat Rating : 8   Other Clinical Data Fasting: no Labs: ni Today's Visit #: 47 Starting Date: 01/10/20    Chief Complaint:   OBESITY Betty Cain is here to discuss her progress with her obesity treatment plan.  She is on the the Category 2 Plan and states she is following her eating plan approximately 85 % of the time.  She states she is exercising Cardiovascular Exercise 60 minutes 1 times per week.  Interim History:  [name] has increased intensity and duration of Cardiovascular exercise during her Zumba  She feels that her clothes are fitting looser She is quite interested in in her current muscle mass, in terms of if she increased it since last OV at [facility]  Reivewed Bioimpedance Results with pt: Muscle Mass: +2.6 lbs Adipose Mass: +1.4 lbs  She has brought in several snacks and water flavoring- all items are healthy   Extra time is needed to communicate with pt via [place] Interpretor  Subjective:   1. Abdominal pain, unspecified abdominal location She continues to experience diffuse abd pain She denies N/V/hematochezia Discussed at length that it is not wise to restart GLP-1 therapy Diabetes is well managed on daily low dose Metformin  XR 500mg   2. Type 2 diabetes mellitus with obesity (HCC) Lab Results  Component Value Date   HGBA1C 5.7 (H) 08/24/2023   HGBA1C 5.4 07/21/2023   HGBA1C 5.3 05/12/2023    She continues to experience diffuse abd  pain She denies N/V/hematochezia Discussed at length that it is not wise to restart GLP-1 therapy Diabetes is well managed on daily low dose Metformin  XR 500mg  She does not like to size of the Metformin  tablet, however can safely swallow Rx  3. Other fatigue She was recently seen by her PCP for fatigue and palpiations She was encouraged to f/u with her established Cardiologist- she has not yet scheduled a Cards OV  4. Vitamin D  deficiency She endorses persistent fatigue She was unable to start Ergocalciferol - as the gelatin is pork based. Pork products and derivatives are not acceptable in her Muslim faiteh  Assessment/Plan:   1. Abdominal pain, unspecified abdominal location (Primary) Monitor sx's and f/u with established GI provider  2. Type 2 diabetes mellitus with obesity (HCC) Refill metFORMIN  (GLUCOPHAGE -XR) 500 MG 24 hr tablet Take 1 tab daily with largest meal TAKE WITH FOOD Dispense: 90 tablet, Refills: 0 ordered   3. Other fatigue Remain well hydrated Increase protein and regular exercise  4. Vitamin D  deficiency Remain off Ergocalciferol  Continue daily MVI Start Cholecalciferol  (VITAMIN D3) 125 MCG (5000 UT) CAPS Take 1 capsule (5,000 Units total) by mouth daily. Dispense: 90 capsule, Refills: 0 ordered   5. Obesity, current BMI 28.5  Betty Cain is currently in the action stage of change. As such, her goal is to continue with weight loss efforts. She has agreed to the Category 2 Plan.   Exercise goals: All adults should avoid inactivity.  Some physical activity is better than none, and adults who participate in any amount of physical activity gain some health benefits. Adults should also include muscle-strengthening activities that involve all major muscle groups on 2 or more days a week.  Behavioral modification strategies: increasing lean protein intake, decreasing simple carbohydrates, increasing vegetables, increasing water intake, no skipping meals, meal planning and  cooking strategies, keeping healthy foods in the home, ways to avoid boredom eating, and planning for success.  Christyanna has agreed to follow-up with our clinic in 4 weeks. She was informed of the importance of frequent follow-up visits to maximize her success with intensive lifestyle modifications for her multiple health conditions.    Objective:   Blood pressure 103/71, pulse 84, temperature 98.6 F (37 C), height 4' 10 (1.473 m), weight 136 lb (61.7 kg), SpO2 97%. Body mass index is 28.42 kg/m.  General: Cooperative, alert, well developed, in no acute distress. HEENT: Conjunctivae and lids unremarkable. Cardiovascular: Regular rhythm.  Lungs: Normal work of breathing. Neurologic: No focal deficits.   Lab Results  Component Value Date   CREATININE 0.58 08/24/2023   BUN 10 08/24/2023   NA 144 08/24/2023   K 4.5 08/24/2023   CL 103 08/24/2023   CO2 24 08/24/2023   Lab Results  Component Value Date   ALT 20 08/24/2023   AST 21 08/24/2023   ALKPHOS 78 08/24/2023   BILITOT 0.4 08/24/2023   Lab Results  Component Value Date   HGBA1C 5.7 (H) 08/24/2023   HGBA1C 5.4 07/21/2023   HGBA1C 5.3 05/12/2023   HGBA1C 5.4 04/22/2023   HGBA1C 5.3 03/07/2023   Lab Results  Component Value Date   INSULIN  6.2 08/24/2023   INSULIN  6.2 05/12/2023   INSULIN  6.5 03/07/2023   INSULIN  7.9 06/21/2022   INSULIN  3.8 12/28/2021   Lab Results  Component Value Date   TSH 1.710 05/12/2023   Lab Results  Component Value Date   CHOL 144 03/07/2023   HDL 42 03/07/2023   LDLCALC 89 03/07/2023   TRIG 66 03/07/2023   CHOLHDL 3.4 03/07/2023   Lab Results  Component Value Date   VD25OH 35.3 08/24/2023   VD25OH 45.8 05/12/2023   VD25OH 41.0 03/07/2023   Lab Results  Component Value Date   WBC 13.8 (H) 12/13/2022   HGB 13.7 12/13/2022   HCT 42.0 12/13/2022   MCV 90.9 12/13/2022   PLT 316 12/13/2022   Lab Results  Component Value Date   IRON 53 10/22/2022   TIBC 253 10/22/2022    FERRITIN 295 (H) 10/22/2022    Attestation Statements:   Reviewed by clinician on day of visit: allergies, medications, problem list, medical history, surgical history, family history, social history, and previous encounter notes.  Extra time is needed to communicate with pt via [facility] Interpretor  I have reviewed the above documentation for accuracy and completeness, and I agree with the above. -  Lenee Franze d. Elisah Parmer, NP-C

## 2023-11-04 ENCOUNTER — Other Ambulatory Visit: Payer: Self-pay | Admitting: Physical Medicine and Rehabilitation

## 2023-11-04 DIAGNOSIS — M545 Low back pain, unspecified: Secondary | ICD-10-CM

## 2023-11-10 ENCOUNTER — Encounter: Payer: Self-pay | Admitting: Physical Medicine and Rehabilitation

## 2023-11-10 ENCOUNTER — Encounter: Attending: Physical Medicine and Rehabilitation | Admitting: Physical Medicine and Rehabilitation

## 2023-11-10 VITALS — BP 120/79 | Temp 80.0°F | Ht <= 58 in | Wt 139.0 lb

## 2023-11-10 DIAGNOSIS — M7918 Myalgia, other site: Secondary | ICD-10-CM | POA: Insufficient documentation

## 2023-11-10 MED ORDER — JOURNAVX 50 MG PO TABS
1.0000 | ORAL_TABLET | Freq: Every day | ORAL | 0 refills | Status: DC | PRN
Start: 2023-11-10 — End: 2023-12-05

## 2023-11-10 MED ORDER — DULOXETINE HCL 60 MG PO CPEP: 60.0000 mg | ORAL_CAPSULE | Freq: Every day | ORAL | 3 refills | Status: AC

## 2023-11-10 MED ORDER — LIDOCAINE HCL 1 % IJ SOLN: 5.0000 mL | Freq: Once | INTRAMUSCULAR | Status: DC

## 2023-11-10 NOTE — Addendum Note (Signed)
 Addended by: LORILEE SVEN SQUIBB on: 11/10/2023 12:21 PM   Modules accepted: Orders

## 2023-11-10 NOTE — Progress Notes (Signed)
 Trigger Point Injection  Indication: Cervical myofascial pain not relieved by medication management and other conservative care.  Informed consent was obtained after describing risk and benefits of the procedure with the patient, this includes bleeding, bruising, infection and medication side effects.  The patient wishes to proceed and has given written consent.  The patient was placed in a seated position.  The area of pain was marked and prepped with Betadine.  It was entered with a 25-gauge 1/2 inch needle and a total of 5 mL of 1% lidocaine and normal saline was injected into a total of 4 trigger points, after negative draw back for blood.  The patient tolerated the procedure well.  Post procedure instructions were given.

## 2023-11-13 ENCOUNTER — Other Ambulatory Visit: Payer: Self-pay | Admitting: Nurse Practitioner

## 2023-11-14 ENCOUNTER — Other Ambulatory Visit: Payer: Self-pay | Admitting: Nurse Practitioner

## 2023-11-25 ENCOUNTER — Other Ambulatory Visit: Payer: Self-pay | Admitting: Nurse Practitioner

## 2023-11-25 ENCOUNTER — Telehealth: Payer: Self-pay

## 2023-11-25 DIAGNOSIS — Z0000 Encounter for general adult medical examination without abnormal findings: Secondary | ICD-10-CM

## 2023-11-25 NOTE — Telephone Encounter (Signed)
 FYI - I see this patient needs a colonoscopy at the hospital with Dr. Leigh.  He has an opening on 9-22 if you would like to get her scheduled

## 2023-11-28 NOTE — Telephone Encounter (Signed)
 MyChart message to patient to see if she can do 9-8 or 9-22 at [facility] with Dr. Leigh.

## 2023-11-29 ENCOUNTER — Other Ambulatory Visit: Payer: Self-pay

## 2023-11-29 DIAGNOSIS — Z860100 Personal history of colon polyps, unspecified: Secondary | ICD-10-CM

## 2023-11-29 NOTE — Telephone Encounter (Signed)
 Called patient using the language line.  She confirmed she can do her procedure on 9-8.  Patient will be scheduled for opening at 9:15 am to arrive at 7:45 am.  Patient scheduled for PV next Tuesday, 8-26 - in a 60 minute slot due to language. Patient confirmed she is not on BT other than aspirin

## 2023-11-29 NOTE — Progress Notes (Signed)
 Patient scheduled for 9-8 at 9:15am. To arrive at 7:45am.  PV scheduled for Tuesday, 8-26

## 2023-12-02 DIAGNOSIS — R809 Proteinuria, unspecified: Secondary | ICD-10-CM | POA: Diagnosis not present

## 2023-12-02 DIAGNOSIS — I129 Hypertensive chronic kidney disease with stage 1 through stage 4 chronic kidney disease, or unspecified chronic kidney disease: Secondary | ICD-10-CM | POA: Diagnosis not present

## 2023-12-02 DIAGNOSIS — R319 Hematuria, unspecified: Secondary | ICD-10-CM | POA: Diagnosis not present

## 2023-12-02 DIAGNOSIS — E119 Type 2 diabetes mellitus without complications: Secondary | ICD-10-CM | POA: Diagnosis not present

## 2023-12-02 DIAGNOSIS — G8929 Other chronic pain: Secondary | ICD-10-CM | POA: Diagnosis not present

## 2023-12-04 ENCOUNTER — Other Ambulatory Visit: Payer: Self-pay | Admitting: Physical Medicine and Rehabilitation

## 2023-12-05 ENCOUNTER — Ambulatory Visit (INDEPENDENT_AMBULATORY_CARE_PROVIDER_SITE_OTHER): Admitting: Adult Health

## 2023-12-05 ENCOUNTER — Encounter (INDEPENDENT_AMBULATORY_CARE_PROVIDER_SITE_OTHER): Payer: Self-pay | Admitting: Adult Health

## 2023-12-05 VITALS — BP 96/65 | HR 82 | Temp 98.4°F | Ht <= 58 in | Wt 134.0 lb

## 2023-12-05 DIAGNOSIS — Z7984 Long term (current) use of oral hypoglycemic drugs: Secondary | ICD-10-CM

## 2023-12-05 DIAGNOSIS — R109 Unspecified abdominal pain: Secondary | ICD-10-CM

## 2023-12-05 DIAGNOSIS — G8929 Other chronic pain: Secondary | ICD-10-CM | POA: Diagnosis not present

## 2023-12-05 DIAGNOSIS — E559 Vitamin D deficiency, unspecified: Secondary | ICD-10-CM | POA: Diagnosis not present

## 2023-12-05 DIAGNOSIS — E669 Obesity, unspecified: Secondary | ICD-10-CM | POA: Diagnosis not present

## 2023-12-05 DIAGNOSIS — E1169 Type 2 diabetes mellitus with other specified complication: Secondary | ICD-10-CM | POA: Diagnosis not present

## 2023-12-05 DIAGNOSIS — Z6831 Body mass index (BMI) 31.0-31.9, adult: Secondary | ICD-10-CM

## 2023-12-05 DIAGNOSIS — Z6828 Body mass index (BMI) 28.0-28.9, adult: Secondary | ICD-10-CM

## 2023-12-05 DIAGNOSIS — E1140 Type 2 diabetes mellitus with diabetic neuropathy, unspecified: Secondary | ICD-10-CM

## 2023-12-05 MED ORDER — METFORMIN HCL ER 500 MG PO TB24: ORAL_TABLET | ORAL | 0 refills | Status: DC

## 2023-12-05 MED ORDER — BLOOD GLUCOSE MONITOR KIT: PACK | 0 refills | Status: DC

## 2023-12-05 NOTE — Progress Notes (Signed)
 WEIGHT SUMMARY AND BIOMETRICS  Vitals Temp: 98.4 F (36.9 C) BP: 96/65 Pulse Rate: 82 SpO2: 100 %   Anthropometric Measurements Height: 4' 10 (1.473 m) Weight: 134 lb (60.8 kg) BMI (Calculated): 28.01 Weight at Last Visit: 136 lb Weight Lost Since Last Visit: 2 lb Weight Gained Since Last Visit: 0 Starting Weight: 152 lb Total Weight Loss (lbs): 18 lb (8.165 kg)   Body Composition  Body Fat %: 39 % Fat Mass (lbs): 52.4 lbs Muscle Mass (lbs): 77.8 lbs Total Body Water (lbs): 55.8 lbs Visceral Fat Rating : 8   Other Clinical Data Fasting: no Labs: no Today's Visit #: 48 Starting Date: 01/10/20    Chief Complaint:   OBESITY Betty Cain is here to discuss her progress with her obesity treatment plan.  She is on the the Category 2 Plan and states she is following her eating plan approximately 80 % of the time.  She states she is exercising Zumba 90 minutes 3 times per week.  Interim History:  Betty Cain started Maintenance Phase on/about 10/26/2022  She endorses boredom with the eating plan- provided cal range and protein goals for each meal to afford for more variety.  Reviewed communication between pt and her established GI provider- Betty Cain strongly requests to restart GLP-1 therapy.  Of Note- [place] interpretor at [facility] during OV  Subjective:   1. Vitamin D  deficiency  Latest Reference Range & Units 03/07/23 10:03 05/12/23 09:25 08/24/23 12:48  Vitamin D , 25-Hydroxy 30.0 - 100.0 ng/mL 41.0 45.8 35.3   10/06/2023 [facility] OV- Stopped daily OTC Vit D 3 1,000 2 tabs Continued on  daily OTC MVI Started on daily Vit D3 5,000 international units    2. Abdominal pain, unspecified abdominal location 03/02/2023 OV with Dr. Armbruster/GI Recommended to suspend GLP-1 therapy over concerns of gastroparesis    She stopped Ozempic  1mg - last dose 02/24/2023   Started on Metformin  XR 500 mg one daily on 03/07/2023  Reviewed communication between pt and her  established GI provider- Betty Cain strongly requests to restart GLP-1 therapy. EPIC Communication on 11/22/2023: It is okay for patient to try restarting Ozempic  again. Okay to send message to her PCP. If she has increased GI symptoms on Ozempic , then she should stop it. Betty Console, PA-C   She has upcoming Colonoscopy on 12/19/2023 with Dr. Leigh   3. Other chronic pain 11/10/2023 Phys Med OV Notes Trigger Point Injection   Indication: Cervical myofascial pain not relieved by medication management and other conservative care.   Informed consent was obtained after describing risk and benefits of the procedure with the patient, this includes bleeding, bruising, infection and medication side effects.  The patient wishes to proceed and has given written consent.  The patient was placed in a seated position.  The area of pain was marked and prepped with Betadine.  It was entered with a 25-gauge 1/2 inch needle and a total of 5 mL of 1% lidocaine  and normal saline was injected into a total of 4 trigger points, after negative draw back for blood.  The patient tolerated the procedure well.  Post procedure instructions were given.      Current medication regime: acetaminophen  (TYLENOL ) 500 MG tablet  aspirin EC 81 MG tablet  ibuprofen  (ADVIL ) 800 MG tablet  pregabalin  (LYRICA ) 50 MG capsule  methocarbamol  (ROBAXIN -750) 750 MG tablet  topiramate  (TOPAMAX ) 25 MG tablet  lidocaine  (XYLOCAINE ) 1 % (with pres) injection 5 mL  DULoxetine  (CYMBALTA ) 60 MG capsule  Suzetrigine  (JOURNAVX ) 50 MG TABS   4. Type 2 diabetes mellitus with obesity (HCC)  Latest Reference Range & Units 08/24/23 12:48  Glucose 70 - 99 mg/dL 82  Hemoglobin J8R 4.8 - 5.6 % 5.7 (H)  Est. average glucose Bld gHb Est-mCnc mg/dL 882  INSULIN  2.6 - 24.9 uIU/mL 6.2  (H): Data is abnormally high 03/02/2023 OV with Dr. Angelene Recommended to suspend GLP-1 therapy over concerns of gastroparesis    She stopped Ozempic   1mg - last dose 02/24/2023   Started on Metformin  XR 500 mg one daily on 03/07/2023  She is not monitoring home CBG, denies sx's of hypoglycemia Again, she requests to restart GLP-1 therapy Reviewed that her A1c is well controlled, BMI < 30, and she has been able to increase regular exercise in terms of duration and intensity. When asked why do you want to restart GLP-1 therapy, she states (via interpreter) I just feel like I need it At length discussed risks/benefits of GLP-1 therapy- will reconsider restarting after colonoscopy with Dr. Leigh early Sept 2025  Assessment/Plan:   1. Vitamin D  deficiency (Primary) Continue current Vit D supplementation  2. Abdominal pain, unspecified abdominal location F/u with GI as directed  3. Other chronic pain Continue healthy eating, regular exercise, daily walking, close f/u by Phys Med.  4. Type 2 diabetes mellitus with obesity (HCC) Refill []   metFORMIN  (GLUCOPHAGE -XR) 500 MG 24 hr tablet Take 1 tab daily with largest meal TAKE WITH FOOD Dispense: 90 tablet, Refills: 0 ordered   Consider restarting weekly Ozempic  0.25mg  after colonoscopy GLP-1 with close surveillance for SE  5. Obesity, current BMI 28.5  Betty Cain is currently in the action stage of change. As such, her goal is to continue with weight loss efforts. She has agreed to the Category 2 Plan.   Exercise goals: All adults should avoid inactivity. Some physical activity is better than none, and adults who participate in any amount of physical activity gain some health benefits. Adults should also include muscle-strengthening activities that involve all major muscle groups on 2 or more days a week.  Behavioral modification strategies: increasing lean protein intake, decreasing simple carbohydrates, increasing vegetables, increasing water intake, no skipping meals, meal planning and cooking strategies, keeping healthy foods in the home, ways to avoid boredom eating, and planning for  success.  Betty Cain has agreed to follow-up with our clinic in 4 weeks. She was informed of the importance of frequent follow-up visits to maximize her success with intensive lifestyle modifications for her multiple health conditions.   Objective:   Blood pressure 96/65, pulse 82, temperature 98.4 F (36.9 C), height 4' 10 (1.473 m), weight 134 lb (60.8 kg), SpO2 100%. Body mass index is 28.01 kg/m.  General: Cooperative, alert, well developed, in no acute distress. HEENT: Conjunctivae and lids unremarkable. Cardiovascular: Regular rhythm.  Lungs: Normal work of breathing. Neurologic: No focal deficits.   Lab Results  Component Value Date   CREATININE 0.58 08/24/2023   BUN 10 08/24/2023   NA 144 08/24/2023   K 4.5 08/24/2023   CL 103 08/24/2023   CO2 24 08/24/2023   Lab Results  Component Value Date   ALT 20 08/24/2023   AST 21 08/24/2023   ALKPHOS 78 08/24/2023   BILITOT 0.4 08/24/2023   Lab Results  Component Value Date   HGBA1C 5.7 (H) 08/24/2023   HGBA1C 5.4 07/21/2023   HGBA1C 5.3 05/12/2023   HGBA1C 5.4 04/22/2023   HGBA1C 5.3 03/07/2023   Lab Results  Component Value  Date   INSULIN  6.2 08/24/2023   INSULIN  6.2 05/12/2023   INSULIN  6.5 03/07/2023   INSULIN  7.9 06/21/2022   INSULIN  3.8 12/28/2021   Lab Results  Component Value Date   TSH 1.710 05/12/2023   Lab Results  Component Value Date   CHOL 144 03/07/2023   HDL 42 03/07/2023   LDLCALC 89 03/07/2023   TRIG 66 03/07/2023   CHOLHDL 3.4 03/07/2023   Lab Results  Component Value Date   VD25OH 35.3 08/24/2023   VD25OH 45.8 05/12/2023   VD25OH 41.0 03/07/2023   Lab Results  Component Value Date   WBC 13.8 (H) 12/13/2022   HGB 13.7 12/13/2022   HCT 42.0 12/13/2022   MCV 90.9 12/13/2022   PLT 316 12/13/2022   Lab Results  Component Value Date   IRON 53 10/22/2022   TIBC 253 10/22/2022   FERRITIN 295 (H) 10/22/2022    Attestation Statements:   Reviewed by clinician on day of visit:  allergies, medications, problem list, medical history, surgical history, family history, social history, and previous encounter notes.  Additional time needed to allow for communication with pt via [facility]  I have reviewed the above documentation for accuracy and completeness, and I agree with the above. -  Luiz Trumpower d. Fronnie Urton, NP-C

## 2023-12-06 ENCOUNTER — Ambulatory Visit (AMBULATORY_SURGERY_CENTER)

## 2023-12-06 ENCOUNTER — Other Ambulatory Visit: Payer: Self-pay | Admitting: Physical Medicine and Rehabilitation

## 2023-12-06 VITALS — Ht <= 58 in | Wt 136.6 lb

## 2023-12-06 DIAGNOSIS — Z860100 Personal history of colon polyps, unspecified: Secondary | ICD-10-CM

## 2023-12-06 MED ORDER — NA SULFATE-K SULFATE-MG SULF 17.5-3.13-1.6 GM/177ML PO SOLN: 1.0000 | Freq: Once | ORAL | 0 refills | Status: AC

## 2023-12-06 NOTE — Progress Notes (Signed)
 No egg or soy allergy  known to patient  No issues known to pt with past sedation with any surgeries or procedures Patient denies ever being told they had issues or difficulty with intubation  No FH of Malignant Hyperthermia Pt is not on diet pills Pt is not on  home 02  Pt is not on blood thinners  Chronic constipation  No A fib or A flutter Have any cardiac testing pending--no  LOA: independent  Prep: suprep   Patient's chart reviewed by Norleen Schillings CNRA prior to previsit and patient appropriate for the [facility].  Previsit completed and red dot placed by patient's name on their procedure day (on provider's schedule).     PV completed with patient. Interpreter present. Prep instructions sent via mychart and hard copy given at [facility] apt.

## 2023-12-08 ENCOUNTER — Other Ambulatory Visit: Payer: Self-pay | Admitting: Nurse Practitioner

## 2023-12-13 ENCOUNTER — Encounter (HOSPITAL_COMMUNITY): Payer: Self-pay | Admitting: Gastroenterology

## 2023-12-15 ENCOUNTER — Telehealth: Payer: Self-pay

## 2023-12-15 NOTE — Telephone Encounter (Signed)
 Procedure:COLON Procedure date: 12/19/23 Procedure location: [facility] Arrival Time: 10:15 Spoke with the patient Y/N: Y Any prep concerns? N  Has the patient obtained the prep from the pharmacy ? Y Do you have a care partner and transportation: Y Any additional concerns? N

## 2023-12-19 ENCOUNTER — Encounter (HOSPITAL_COMMUNITY)
Admission: RE | Disposition: A | Discharge status: Home / Self Care | Payer: Self-pay | Source: Home / Self Care | Attending: Gastroenterology

## 2023-12-19 ENCOUNTER — Ambulatory Visit (HOSPITAL_COMMUNITY)
Admission: RE | Admit: 2023-12-19 | Discharge: 2023-12-19 | Disposition: A | Discharge status: Home / Self Care | Attending: Gastroenterology | Admitting: Gastroenterology

## 2023-12-19 ENCOUNTER — Ambulatory Visit (HOSPITAL_COMMUNITY): Admitting: Certified Registered Nurse Anesthetist

## 2023-12-19 ENCOUNTER — Other Ambulatory Visit: Payer: Self-pay

## 2023-12-19 ENCOUNTER — Encounter (HOSPITAL_COMMUNITY): Payer: Self-pay | Admitting: Gastroenterology

## 2023-12-19 ENCOUNTER — Ambulatory Visit (HOSPITAL_BASED_OUTPATIENT_CLINIC_OR_DEPARTMENT_OTHER): Admitting: Certified Registered Nurse Anesthetist

## 2023-12-19 DIAGNOSIS — Z860100 Personal history of colon polyps, unspecified: Secondary | ICD-10-CM | POA: Diagnosis not present

## 2023-12-19 DIAGNOSIS — E119 Type 2 diabetes mellitus without complications: Secondary | ICD-10-CM | POA: Insufficient documentation

## 2023-12-19 DIAGNOSIS — I10 Essential (primary) hypertension: Secondary | ICD-10-CM | POA: Insufficient documentation

## 2023-12-19 DIAGNOSIS — Z860101 Personal history of adenomatous and serrated colon polyps: Secondary | ICD-10-CM | POA: Diagnosis not present

## 2023-12-19 DIAGNOSIS — Z7984 Long term (current) use of oral hypoglycemic drugs: Secondary | ICD-10-CM | POA: Insufficient documentation

## 2023-12-19 DIAGNOSIS — Z1211 Encounter for screening for malignant neoplasm of colon: Secondary | ICD-10-CM

## 2023-12-19 DIAGNOSIS — K648 Other hemorrhoids: Secondary | ICD-10-CM

## 2023-12-19 DIAGNOSIS — M797 Fibromyalgia: Secondary | ICD-10-CM | POA: Insufficient documentation

## 2023-12-19 DIAGNOSIS — J45909 Unspecified asthma, uncomplicated: Secondary | ICD-10-CM | POA: Diagnosis not present

## 2023-12-19 DIAGNOSIS — K589 Irritable bowel syndrome without diarrhea: Secondary | ICD-10-CM | POA: Insufficient documentation

## 2023-12-19 DIAGNOSIS — K219 Gastro-esophageal reflux disease without esophagitis: Secondary | ICD-10-CM | POA: Insufficient documentation

## 2023-12-19 HISTORY — PX: COLONOSCOPY: SHX5424

## 2023-12-19 LAB — GLUCOSE, CAPILLARY
Glucose-Capillary: 92 mg/dL (ref 70–99)
Glucose-Capillary: 72 mg/dL (ref 70–99)

## 2023-12-19 SURGERY — COLONOSCOPY
Anesthesia: Monitor Anesthesia Care

## 2023-12-19 MED ORDER — SODIUM CHLORIDE 0.9 % IV SOLN: INTRAVENOUS | Status: DC

## 2023-12-19 MED ORDER — PROPOFOL 10 MG/ML IV BOLUS
INTRAVENOUS | Status: DC | PRN
  Administered 2023-12-19: 20 mg via INTRAVENOUS

## 2023-12-19 MED ORDER — AMISULPRIDE (ANTIEMETIC) 5 MG/2ML IV SOLN: 10.0000 mg | Freq: Once | INTRAVENOUS | Status: DC | PRN

## 2023-12-19 MED ORDER — ONDANSETRON HCL 4 MG/2ML IJ SOLN: 4.0000 mg | Freq: Once | INTRAMUSCULAR | Status: DC | PRN

## 2023-12-19 MED ORDER — ACETAMINOPHEN 500 MG PO TABS
ORAL_TABLET | ORAL | Status: AC
  Filled 2023-12-19: qty 1

## 2023-12-19 MED ORDER — LIDOCAINE HCL 1 % IJ SOLN
INTRAMUSCULAR | Status: DC | PRN
  Administered 2023-12-19: 50 mg via INTRADERMAL

## 2023-12-19 MED ORDER — ACETAMINOPHEN 325 MG PO TABS
ORAL_TABLET | ORAL | Status: DC | PRN
Start: 2023-12-19 — End: 2023-12-19
  Administered 2023-12-19: 500 mg via ORAL

## 2023-12-19 MED ORDER — PROPOFOL 500 MG/50ML IV EMUL
INTRAVENOUS | Status: DC | PRN
  Administered 2023-12-19: 150 ug/kg/min via INTRAVENOUS

## 2023-12-19 MED ORDER — PROPOFOL 1000 MG/100ML IV EMUL
INTRAVENOUS | Status: AC
  Filled 2023-12-19: qty 100

## 2023-12-19 MED ORDER — DEXMEDETOMIDINE HCL IN NACL 200 MCG/50ML IV SOLN
INTRAVENOUS | Status: DC | PRN
  Administered 2023-12-19: 8 ug via INTRAVENOUS

## 2023-12-19 NOTE — Transfer of Care (Signed)
 Immediate Anesthesia Transfer of Care Note  Patient: Betty Cain  Procedure(s) Performed: COLONOSCOPY  Patient Location: PACU and Endoscopy Unit  Anesthesia Type:MAC  Level of Consciousness: awake, alert , oriented, and patient cooperative  Airway & Oxygen Therapy: Patient Spontanous Breathing and Patient connected to face mask oxygen  Post-op Assessment: Report given to RN and Post -op Vital signs reviewed and stable  Post vital signs: Reviewed and stable  Last Vitals:  Vitals Value Taken Time  BP 108/63 12/19/23 12:12  Temp    Pulse 97 12/19/23 12:15  Resp 18 12/19/23 12:15  SpO2 100 % 12/19/23 12:15  Vitals shown include unfiled device data.  Last Pain:  Vitals:   12/19/23 1212  TempSrc:   PainSc: 0-No pain         Complications: No notable events documented.

## 2023-12-19 NOTE — Op Note (Signed)
 [facility] Patient Name: Betty Cain Procedure Date: 12/19/2023 MRN: [id number] Attending MD: Elspeth SQUIBB. Leigh , MD, [id number] Date of Birth: 1975/11/02 CSN: [id number] Age: 48 Admit Type: Outpatient Procedure:                Colonoscopy Indications:              High risk colon cancer surveillance: Personal                            history of colonic polyps - sessile serrated polyp                            removed 5 years ago, age 68 Providers:                Elspeth SQUIBB. Leigh, MD, Jacquelyn Jaci Pierce,                            RN, Curtistine Bishop, Technician Referring MD:              Medicines:                Monitored Anesthesia Care Complications:            No immediate complications. Estimated blood loss:                            None. Estimated Blood Loss:     Estimated blood loss: none. Procedure:                Pre-Anesthesia Assessment:                           - Prior to the procedure, a History and Physical                            was performed, and patient medications and                            allergies were reviewed. The patient's tolerance of                            previous anesthesia was also reviewed. The risks                            and benefits of the procedure and the sedation                            options and risks were discussed with the patient.                            All questions were answered, and informed consent                            was obtained. Prior Anticoagulants: The patient has  taken no anticoagulant or antiplatelet agents. ASA                            Grade Assessment: II - A patient with mild systemic                            disease. After reviewing the risks and benefits,                            the patient was deemed in satisfactory condition to                            undergo the procedure.                           After obtaining informed  consent, the colonoscope                            was passed under direct vision. Throughout the                            procedure, the patient's blood pressure, pulse, and                            oxygen saturations were monitored continuously. The                            [id number]-[id number] ([id number]) colonoscope was introduced                            through the anus and advanced to the the terminal                            ileum, with identification of the appendiceal                            orifice and IC valve. The colonoscopy was performed                            without difficulty. The patient tolerated the                            procedure well. The quality of the bowel                            preparation was good. The terminal ileum, ileocecal                            valve, appendiceal orifice, and rectum were                            photographed. Scope In: 11:47:18 AM Scope Out: 12:04:11 PM Scope Withdrawal Time: 0 hours 12 minutes 33 seconds  Total Procedure Duration: 0 hours 16 minutes 53  seconds  Findings:      The perianal and digital rectal examinations were normal.      The terminal ileum appeared normal.      Internal hemorrhoids were found during retroflexion.      The exam was otherwise without abnormality. Impression:               - The examined portion of the ileum was normal.                           - Internal hemorrhoids.                           - The examination was otherwise normal.                           - No polyps. Moderate Sedation:      No moderate sedation, case performed with MAC Recommendation:           - Patient has a contact number available for                            emergencies. The signs and symptoms of potential                            delayed complications were discussed with the                            patient. Return to normal activities tomorrow.                            Written discharge  instructions were provided to the                            patient.                           - Resume previous diet.                           - Continue present medications.                           - Repeat colonoscopy in 5 years for surveillance                            given history of polyp removed at a young age. Procedure Code(s):        --- Professional ---                           [phone], Colonoscopy, flexible; diagnostic, including                            collection of specimen(s) by brushing or washing,                            when performed (separate procedure) Diagnosis Code(s):        ---  Professional ---                           Z86.010, Personal history of colonic polyps                           K64.8, Other hemorrhoids CPT copyright 2022 [employer]. All rights reserved. The codes documented in this report are preliminary and upon coder review may  be revised to meet current compliance requirements. Elspeth P. Malasia Torain, MD 12/19/2023 12:12:51 PM This report has been signed electronically. Number of Addenda: 0

## 2023-12-19 NOTE — Progress Notes (Signed)
 Please keep covered during procedure as much as possible per pt.

## 2023-12-19 NOTE — H&P (Signed)
 [facility] History and Physical   Primary Care Physician:  Oley Bascom RAMAN, NP   Reason for Procedure:   History of colon polyps  Plan:    colonoscopy     HPI: Betty Cain is a 48 y.o. female  here for colonoscopy surveillance. SSP removed 11/2018 during colonoscopy for another reason. She has some altered bowel habits, suspected IBS. No family history of colon cancer known. Otherwise feels well without any cardiopulmonary symptoms. Case done at the hospital for anesthesia support given episode of hypoxia during EGD a few years ago in the office.   I have discussed risks / benefits of anesthesia and endoscopic procedure with Calinda Northrup and they wish to proceed with the exams as outlined today.    Past Medical History:  Diagnosis Date   Allergy     Anemia    Anxiety 01/2019   Asthma    B12 deficiency    Back pain    Common migraine with intractable migraine 06/28/2017   Constipation    Diabetes (HCC) 02/2019   Fatigue    Fibromyalgia    GERD (gastroesophageal reflux disease)    pos H pylori   Hypertension    controlled with medication   IBS (irritable bowel syndrome) 2005   Joint pain    Neonatal death    Vaginal delivery, full term-lived x2 hours.    Palpitations    Shortness of breath    Shortness of breath on exertion    Spinal headache    Swelling of both lower extremities    Valvular heart disease    Vitamin D  deficiency 02/2019    Past Surgical History:  Procedure Laterality Date   ADENOIDECTOMY     and tonsils (as a child)   BIOPSY  08/27/2021   Procedure: BIOPSY;  Surgeon: Leigh Elspeth SQUIBB, MD;  Location: [facility];  Service: Gastroenterology;;   boil  2003   right elbow   CESAREAN SECTION     x4   CESAREAN SECTION  06/02/2011   Procedure: CESAREAN SECTION;  Surgeon: Norleen LULLA Server, MD;  Location: [facility];  Service: Gynecology;  Laterality: N/A;  Primary Cesarean Section Delivery Baby Boy @ 0004, Apgars 9/9   CESAREAN SECTION N/A  12/10/2013   Procedure: REPEAT CESAREAN SECTION;  Surgeon: Winton Felt, MD;  Location: [facility];  Service: Obstetrics;  Laterality: N/A;   CESAREAN SECTION     colonoscopy  11/23/2018   stated had issues with sleep when in for procedure   ESOPHAGOGASTRODUODENOSCOPY (EGD) WITH PROPOFOL  N/A 08/27/2021   Procedure: ESOPHAGOGASTRODUODENOSCOPY (EGD) WITH PROPOFOL ;  Surgeon: Leigh Elspeth SQUIBB, MD;  Location: [facility];  Service: Gastroenterology;  Laterality: N/A;   OTHER SURGICAL HISTORY  2005   Uterine surgery    uterine cauterization      Prior to Admission medications   Medication Sig Start Date End Date Taking? Authorizing Provider  Accu-Chek Softclix Lancets lancets USE AS DIRECTED 10/07/23   Oley Bascom RAMAN, NP  acetaminophen  (TYLENOL ) 500 MG tablet Take 500 mg by mouth every 6 (six) hours as needed.    [provider]  aspirin EC 81 MG tablet Take 81 mg by mouth daily. Swallow whole.    [provider]  bisoprolol  (ZEBETA ) 5 MG tablet Take 1 tablet (5 mg total) by mouth 2 (two) times daily. 04/22/23   Oley Bascom RAMAN, NP  blood glucose meter kit and supplies KIT Dispense based on patient and insurance preference. Use up to four times daily as directed.  12/05/23   Jonel Pee D, NP  Blood Pressure Monitoring DEVI 1 each by Does not apply route daily. 02/01/18   Vicenta Maduro, FNP  Cholecalciferol  (VITAMIN D3) 125 MCG (5000 UT) CAPS Take 1 capsule (5,000 Units total) by mouth daily. 11/03/23   Jonel Pee D, NP  cloNIDine  (CATAPRES ) 0.1 MG tablet Take 1 tablet by mouth twice daily 11/14/23   Nichols, Tonya S, NP  docusate sodium  (COLACE) 100 MG capsule Take 1 capsule (100 mg total) by mouth 2 (two) times daily. Patient not taking: Reported on 12/06/2023 07/23/22   Oley Bascom RAMAN, NP  DULoxetine  (CYMBALTA ) 60 MG capsule Take 1 capsule (60 mg total) by mouth daily. 11/10/23   Raulkar, Sven SQUIBB, MD  ferrous sulfate  (FEROSUL) 325 (65 FE) MG tablet TAKE 1 TABLET (325 MG  TOTAL) BY MOUTH DAILY. Patient not taking: Reported on 12/06/2023 02/22/20   Myrna Camelia HERO, NP  fluticasone  (FLONASE ) 50 MCG/ACT nasal spray Use 2 spray(s) in each nostril once daily 08/08/23   Nichols, Tonya S, NP  Fremanezumab -vfrm (AJOVY ) 225 MG/1.5ML SOAJ Inject 225 mg into the skin every 30 (thirty) days. 02/09/23   Millikan, Megan, NP  glucose blood (ACCU-CHEK GUIDE) test strip Use as instructed 02/24/22   Opalski, Barnie, DO  ibuprofen  (ADVIL ) 800 MG tablet Take 800 mg by mouth every 6 (six) hours as needed. 02/01/23   [provider]  JOURNAVX  50 MG TABS TAKE 1 TABLET BY MOUTH ONCE DAILY AS NEEDED 12/05/23   Raulkar, Sven SQUIBB, MD  KLOR-CON  M20 20 MEQ tablet Take 1 tablet by mouth once daily 08/05/22   Nichols, Tonya S, NP  levocetirizine (XYZAL ) 5 MG tablet TAKE 1 TABLET BY MOUTH ONCE DAILY IN THE EVENING 12/08/23   Nichols, Tonya S, NP  lidocaine  (LIDODERM ) 5 % USE 1 PATCH EXTERNALLY ONCE DAILY. REMOVE & DISCARD PATCH WITHIN 12 HOURS OR AS DIRECTED BY MD Patient not taking: Reported on 12/06/2023 11/08/23   Raulkar, Sven SQUIBB, MD  metFORMIN  (GLUCOPHAGE -XR) 500 MG 24 hr tablet Take 1 tab daily with largest meal TAKE WITH FOOD 12/05/23   Danford, Pee D, NP  methocarbamol  (ROBAXIN -750) 750 MG tablet Take 1 tablet (750 mg total) by mouth at bedtime as needed for muscle spasms. 08/01/23   Lorilee Sven SQUIBB, MD  methylcellulose (CITRUCEL) oral powder Take as directed, daily 12/26/20   Aminta Sakurai, Elspeth SQUIBB, MD  Multiple Vitamin (MULTI VITAMIN DAILY PO) Take by mouth.    [provider]  ondansetron  (ZOFRAN ) 4 MG tablet TAKE 1 TABLET BY MOUTH EVERY 8 HOURS AS NEEDED FOR NAUSEA FOR VOMITING 06/24/23   Lylianna Fraiser, Elspeth SQUIBB, MD  ondansetron  (ZOFRAN -ODT) 4 MG disintegrating tablet DISSOLVE 1 TABLET IN MOUTH EVERY 8 HOURS AS NEEDED 09/26/23   Nichols, Tonya S, NP  pantoprazole  (PROTONIX ) 40 MG tablet Take 40 mg by mouth 2 (two) times daily. 07/15/23   [provider]  pregabalin   (LYRICA ) 50 MG capsule Take 1 capsule by mouth at bedtime 12/06/23   Raulkar, Krutika P, MD  Probiotic Product (ALIGN EXTRA STRENGTH) CAPS Take 1 capsule by mouth daily. 10/20/23   Oley Bascom RAMAN, NP  Rimegepant Sulfate  75 MG TBDP Take 1 tab at onset of migraine. May repeat in 2 hrs, if needed. Max dose: 2 tabs/day or 15/month. This is a 90 day rx. 02/09/23   Sherryl Bouchard, NP  rosuvastatin  (CRESTOR ) 5 MG tablet TAKE 1 TABLET BY MOUTH AT BEDTIME *APPOINTMENT REQUIRED FOR FURTHER REFILLS* 11/25/23   Oley Bascom RAMAN,  NP  sucralfate  (CARAFATE ) 1 g tablet Take 1 tablet (1 g total) by mouth in the morning, at noon, and at bedtime. SLOWLY DISSOLVE 1 TABLET IN 1 TABLETSPOON OF DISTILLED WATER PRIOR TO INGESTION. Take three times daily Patient taking differently: Take 1 g by mouth 3 (three) times daily as needed. SLOWLY DISSOLVE 1 TABLET IN 1 TABLETSPOON OF DISTILLED WATER PRIOR TO INGESTION. Take three times daily 09/26/23   [place], PA-C  topiramate  (TOPAMAX ) 25 MG tablet Take 1 tablet (25 mg total) by mouth at bedtime. 09/13/23   Raulkar, Sven SQUIBB, MD  VENTOLIN  HFA 108 (90 Base) MCG/ACT inhaler INHALE 2 PUFFS BY MOUTH EVERY 6 HOURS AS NEEDED FOR WHEEZING OR SHORTNESS OF BREATH 09/26/23   Oley Bascom RAMAN, NP    Current Facility-Administered Medications  Medication Dose Route Frequency Provider Last Rate Last Admin   0.9 %  sodium chloride  infusion   Intravenous Continuous Bryer Gottsch, Elspeth SQUIBB, MD        Allergies as of 11/29/2023 - Review Complete 11/10/2023  Allergen Reaction Noted   Seasonal ic [octacosanol]  10/22/2022   Topamax  [topiramate ] Itching and Rash 06/13/2018    Family History  Problem Relation Age of Onset   Diabetes Mother    Diabetes Father    Diabetes Sister    Migraines Sister    Migraines Daughter    Anesthesia problems Neg Hx    Colon cancer Neg Hx    Esophageal cancer Neg Hx    Rectal cancer Neg Hx    Stomach cancer Neg Hx    Colon polyps Neg Hx     Social  History   Socioeconomic History   Marital status: Married    Spouse name: Teacher, adult education   Number of children: 4   Years of education: Not on file   Highest education level: Some college, no degree  Occupational History   Occupation: Unemployed  Tobacco Use   Smoking status: Never   Smokeless tobacco: Never  Vaping Use   Vaping status: Never Used  Substance and Sexual Activity   Alcohol use: No   Drug use: No   Sexual activity: Yes    Birth control/protection: None    Comment: pregnant  Other Topics Concern   Not on file  Social History Narrative   Lives with husband and child   Caffeine  use: daily (tea), sometimes coffee/soda   Right handed    Social Drivers of Health   Financial Resource Strain: Low Risk  (10/17/2023)   Overall Financial Resource Strain (CARDIA)    Difficulty of Paying Living Expenses: Not very hard  Food Insecurity: No Food Insecurity (10/17/2023)   Hunger Vital Sign    Worried About Running Out of Food in the Last Year: Never true    Ran Out of Food in the Last Year: Never true  Transportation Needs: No Transportation Needs (10/17/2023)   PRAPARE - Administrator, Civil Service (Medical): No    Lack of Transportation (Non-Medical): No  Physical Activity: Inactive (10/17/2023)   Exercise Vital Sign    Days of Exercise per Week: 0 days    Minutes of Exercise per Session: Not on file  Stress: No Stress Concern Present (10/17/2023)   [employer] of Occupational Health - Occupational Stress Questionnaire    Feeling of Stress: Not at all  Social Connections: Moderately Integrated (10/17/2023)   Social Connection and Isolation Panel    Frequency of Communication with Friends and Family: More than three times a week  Frequency of Social Gatherings with Friends and Family: Once a week    Attends Religious Services: 1 to 4 times per year    Active Member of [employer] or Organizations: No    Attends Engineer, structural: Not on file    Marital  Status: Married  Catering manager Violence: Not on file    Review of Systems: All other review of systems negative except as mentioned in the HPI.  Physical Exam: Vital signs There were no vitals taken for this visit.  General:   Alert,  Well-developed, pleasant and cooperative in NAD Lungs:  Clear throughout to auscultation.   Heart:  Regular rate and rhythm Abdomen:  Soft, nontender and nondistended.   Neuro/Psych:  Alert and cooperative. Normal mood and affect. A and O x 3  Marcey Naval, MD [facility]

## 2023-12-19 NOTE — Anesthesia Preprocedure Evaluation (Signed)
 Anesthesia Evaluation  Patient identified by MRN, date of birth, ID band Patient awake    Reviewed: Allergy  & Precautions, NPO status , Patient's Chart, lab work & pertinent test results  History of Anesthesia Complications (+) history of anesthetic complications  Airway Mallampati: II  TM Distance: >3 FB Neck ROM: Full  Mouth opening: Limited Mouth Opening  Dental  (+) Teeth Intact, Dental Advisory Given   Pulmonary asthma    breath sounds clear to auscultation       Cardiovascular hypertension,  Rhythm:Regular Rate:Normal     Neuro/Psych  Headaches  Anxiety      Neuromuscular disease    GI/Hepatic Neg liver ROS,GERD  Medicated,,  Endo/Other  diabetes, Type 2, Oral Hypoglycemic Agents    Renal/GU negative Renal ROS     Musculoskeletal  (+)  Fibromyalgia -  Abdominal   Peds  Hematology  (+) Blood dyscrasia, anemia   Anesthesia Other Findings   Reproductive/Obstetrics                              Anesthesia Physical Anesthesia Plan  ASA: 2  Anesthesia Plan: MAC   Post-op Pain Management: Minimal or no pain anticipated   Induction: Intravenous  PONV Risk Score and Plan: 0 and Propofol  infusion  Airway Management Planned: Natural Airway and Nasal Cannula  Additional Equipment: None  Intra-op Plan:   Post-operative Plan:   Informed Consent: I have reviewed the patients History and Physical, chart, labs and discussed the procedure including the risks, benefits and alternatives for the proposed anesthesia with the patient or authorized representative who has indicated his/her understanding and acceptance.       Plan Discussed with: CRNA  Anesthesia Plan Comments:         Anesthesia Quick Evaluation

## 2023-12-19 NOTE — Anesthesia Postprocedure Evaluation (Signed)
 Anesthesia Post Note  Patient: Betty Cain  Procedure(s) Performed: COLONOSCOPY     Patient location during evaluation: PACU Anesthesia Type: MAC Level of consciousness: awake and alert Pain management: pain level controlled Vital Signs Assessment: post-procedure vital signs reviewed and stable Respiratory status: spontaneous breathing, nonlabored ventilation, respiratory function stable and patient connected to nasal cannula oxygen Cardiovascular status: stable and blood pressure returned to baseline Postop Assessment: no apparent nausea or vomiting Anesthetic complications: no   No notable events documented.  Last Vitals:  Vitals:   12/19/23 1220 12/19/23 1230  BP: 110/74 109/74  Pulse: 95 93  Resp: 13 10  Temp:    SpO2: 100% 100%    Last Pain:  Vitals:   12/19/23 1237  TempSrc:   PainSc: 5                  Franky JONETTA Bald

## 2023-12-19 NOTE — Discharge Instructions (Signed)

## 2023-12-20 ENCOUNTER — Encounter (HOSPITAL_COMMUNITY): Payer: Self-pay | Admitting: Gastroenterology

## 2023-12-21 ENCOUNTER — Other Ambulatory Visit: Payer: Self-pay | Admitting: Nurse Practitioner

## 2023-12-21 DIAGNOSIS — Z0000 Encounter for general adult medical examination without abnormal findings: Secondary | ICD-10-CM

## 2023-12-29 ENCOUNTER — Ambulatory Visit (INDEPENDENT_AMBULATORY_CARE_PROVIDER_SITE_OTHER): Admitting: Adult Health

## 2023-12-31 ENCOUNTER — Other Ambulatory Visit: Payer: Self-pay | Admitting: Physical Medicine and Rehabilitation

## 2024-01-01 ENCOUNTER — Other Ambulatory Visit: Payer: Self-pay | Admitting: Physical Medicine and Rehabilitation

## 2024-01-03 ENCOUNTER — Other Ambulatory Visit: Payer: Self-pay | Admitting: Nurse Practitioner

## 2024-01-04 ENCOUNTER — Telehealth: Payer: Self-pay

## 2024-01-05 NOTE — Telephone Encounter (Signed)
 Lidocaine  5% Patch approved 01/04/2024-01/03/2025. Pharmacy informed.

## 2024-01-09 ENCOUNTER — Encounter (INDEPENDENT_AMBULATORY_CARE_PROVIDER_SITE_OTHER): Payer: Self-pay | Admitting: Adult Health

## 2024-01-09 ENCOUNTER — Ambulatory Visit (INDEPENDENT_AMBULATORY_CARE_PROVIDER_SITE_OTHER): Admitting: Adult Health

## 2024-01-09 VITALS — BP 100/69 | HR 66 | Temp 98.2°F | Ht <= 58 in | Wt 138.0 lb

## 2024-01-09 DIAGNOSIS — R109 Unspecified abdominal pain: Secondary | ICD-10-CM | POA: Diagnosis not present

## 2024-01-09 DIAGNOSIS — Z7985 Long-term (current) use of injectable non-insulin antidiabetic drugs: Secondary | ICD-10-CM | POA: Diagnosis not present

## 2024-01-09 DIAGNOSIS — Z6828 Body mass index (BMI) 28.0-28.9, adult: Secondary | ICD-10-CM

## 2024-01-09 DIAGNOSIS — Z6831 Body mass index (BMI) 31.0-31.9, adult: Secondary | ICD-10-CM

## 2024-01-09 DIAGNOSIS — G8929 Other chronic pain: Secondary | ICD-10-CM

## 2024-01-09 DIAGNOSIS — E559 Vitamin D deficiency, unspecified: Secondary | ICD-10-CM | POA: Diagnosis not present

## 2024-01-09 DIAGNOSIS — Z7984 Long term (current) use of oral hypoglycemic drugs: Secondary | ICD-10-CM

## 2024-01-09 DIAGNOSIS — E1159 Type 2 diabetes mellitus with other circulatory complications: Secondary | ICD-10-CM

## 2024-01-09 DIAGNOSIS — I152 Hypertension secondary to endocrine disorders: Secondary | ICD-10-CM

## 2024-01-09 DIAGNOSIS — E669 Obesity, unspecified: Secondary | ICD-10-CM | POA: Diagnosis not present

## 2024-01-09 DIAGNOSIS — E1169 Type 2 diabetes mellitus with other specified complication: Secondary | ICD-10-CM

## 2024-01-09 MED ORDER — OZEMPIC (0.25 OR 0.5 MG/DOSE) 2 MG/3ML ~~LOC~~ SOPN: 0.2500 mg | PEN_INJECTOR | SUBCUTANEOUS | 0 refills | Status: DC

## 2024-01-09 MED ORDER — VITAMIN D3 125 MCG (5000 UT) PO CAPS: 5000.0000 [IU] | ORAL_CAPSULE | Freq: Every day | ORAL | 0 refills | Status: AC

## 2024-01-09 NOTE — Progress Notes (Signed)
 WEIGHT SUMMARY AND BIOMETRICS  No data recorded Anthropometric Measurements Height: 4' 10 (1.473 m) Weight at Last Visit: 134 lb Starting Weight: 52 lb   No data recorded Other Clinical Data Fasting: no Labs: no Today's Visit #: 49 Starting Date: 01/10/20 Comments: 21    Chief Complaint:   OBESITY Betty Cain is here to discuss her progress with her obesity treatment plan.  She is on the the Category 2 Plan and states she is following her eating plan approximately 75 % of the time.  She states she is exercising: None   Interim History:  Betty Cain started Maintenance Phase on/about 10/26/2022   03/02/2023 OV with Dr. Armbruster/GI Recommended to suspend GLP-1 therapy over concerns of gastroparesis    She stopped Ozempic  1mg - last dose 02/24/2023   Started on Metformin  XR 500 mg one daily on 03/07/2023  EPIC Communication on 11/22/2023: It is okay for patient to try restarting Ozempic  again. Okay to send message to her PCP. If she has increased GI symptoms on Ozempic , then she should stop it. Ellouise Console, PA-C   Colonoscopy on 12/19/2023 with Dr. Leigh REPEAT STUDY 5 YEARS  Life events: Her parents just moved in with she and her family, relocated from [place]  She also lives wit her husband and 4 children (age 42 boy, age 42 boy, age 63 boy, and age 34 girl)  Discussed restarting low dose GLP-1 therapy and to monitor for SE  Subjective:   1. Hypertension associated with diabetes (HCC) BP soft, yet stable at OV She denies sx's of hypotension  2. Vitamin D  deficiency  Latest Reference Range & Units 03/07/23 10:03 05/12/23 09:25 08/24/23 12:48  Vitamin D , 25-Hydroxy 30.0 - 100.0 ng/mL 41.0 45.8 35.3   She is on daily oral B12 500 mchg daily  3. Abdominal pain, unspecified abdominal location 03/02/2023 OV with Dr. Armbruster/GI Recommended to suspend GLP-1 therapy over concerns of gastroparesis    She stopped Ozempic  1mg - last dose  02/24/2023   Started on Metformin  XR 500 mg one daily on 03/07/2023   Reviewed communication between pt and her established GI provider- [name] strongly requests to restart GLP-1 therapy. EPIC Communication on 11/22/2023: It is okay for patient to try restarting Ozempic  again. Okay to send message to her PCP. If she has increased GI symptoms on Ozempic , then she should stop it. Ellouise Console, PA-C   Colonoscopy on 12/19/2023 with Dr. Leigh HPI: Betty Cain is a 48 y.o. female  here for colonoscopy surveillance. SSP removed 11/2018 during colonoscopy for another reason. She has some altered bowel habits, suspected IBS. No family history of colon cancer known. Otherwise feels well without any cardiopulmonary symptoms. Case done at the hospital for anesthesia support given episode of hypoxia during EGD a few years ago in the office.    I have discussed risks / benefits of anesthesia and endoscopic procedure with Betty Cain and they wish to proceed with the exams as outlined today.   4. Type 2 diabetes mellitus with obesity Lab Results  Component Value Date   HGBA1C 5.7 (H) 08/24/2023   HGBA1C 5.4 07/21/2023   HGBA1C 5.3 05/12/2023    03/02/2023 OV with Dr. Armbruster/GI Recommended to suspend GLP-1 therapy over concerns of gastroparesis    She stopped Ozempic  1mg - last dose 02/24/2023   Started on Metformin  XR 500 mg one daily on 03/07/2023   Reviewed communication between pt and her established GI provider- Ms. Gladman strongly requests to restart GLP-1 therapy.  EPIC Communication on 11/22/2023: It is okay for patient to try restarting Ozempic  again. Okay to send message to her PCP. If she has increased GI symptoms on Ozempic , then she should stop it. Ellouise Console, PA-C   Colonoscopy on 12/19/2023 with Dr. Leigh  Discussed restarting low dose GLP-1 therapy and to monitor for SE  Assessment/Plan:   1. Hypertension associated with diabetes (HCC) Continue aspirin EC  81 MG tablet  bisoprolol  (ZEBETA ) 5 MG tablet  cloNIDine  (CATAPRES ) 0.1 MG tablet  metFORMIN  (GLUCOPHAGE -XR) 500 MG 24 hr tablet  rosuvastatin  (CRESTOR ) 5 MG tablet  Semaglutide ,0.25 or 0.5MG /DOS, (OZEMPIC , 0.25 OR 0.5 MG/DOSE,) 2 MG/3ML SOPN   2. Vitamin D  deficiency (Primary) Refill Cholecalciferol  (VITAMIN D3) 125 MCG (5000 UT) CAPS Take 1 capsule (5,000 Units total) by mouth daily. Dispense: 90 capsule, Refills: 0 ordered   3. Abdominal pain, unspecified abdominal location Restart  Semaglutide ,0.25 or 0.5MG /DOS, (OZEMPIC , 0.25 OR 0.5 MG/DOSE,) 2 MG/3ML SOPN Inject 0.25 mg into the skin once a week. Dispense: 3 mL, Refills: 0 ordered   If ANY GI upset develops,stop GLP-1 therapy and contact [facility]  4. Type 2 diabetes mellitus with obesity Continue daily Metformin  XR 500mg  daily with a meal Restart  Semaglutide ,0.25 or 0.5MG /DOS, (OZEMPIC , 0.25 OR 0.5 MG/DOSE,) 2 MG/3ML SOPN Inject 0.25 mg into the skin once a week. Dispense: 3 mL, Refills: 0 ordered   If ANY GI upset develops,stop GLP-1 therapy and contact [facility]  5. Obesity, current BMI 28.8  Betty Cain is currently in the action stage of change. As such, her goal is to continue with weight loss efforts. She has agreed to the Category 2 Plan.   Exercise goals: All adults should avoid inactivity. Some physical activity is better than none, and adults who participate in any amount of physical activity gain some health benefits. Adults should also include muscle-strengthening activities that involve all major muscle groups on 2 or more days a week.  Behavioral modification strategies: increasing lean protein intake, decreasing simple carbohydrates, increasing vegetables, increasing water intake, meal planning and cooking strategies, keeping healthy foods in the home, ways to avoid boredom eating, ways to avoid night time snacking, and planning for success.  Itzelle has agreed to follow-up with our clinic in 4 weeks. She was informed of the  importance of frequent follow-up visits to maximize her success with intensive lifestyle modifications for her multiple health conditions.   Check Fasting Labs Fall 2025  Objective:   Height 4' 10 (1.473 m). Body mass index is 28.42 kg/m.  General: Cooperative, alert, well developed, in no acute distress. HEENT: Conjunctivae and lids unremarkable. Cardiovascular: Regular rhythm.  Lungs: Normal work of breathing. Neurologic: No focal deficits.   Lab Results  Component Value Date   CREATININE 0.58 08/24/2023   BUN 10 08/24/2023   NA 144 08/24/2023   K 4.5 08/24/2023   CL 103 08/24/2023   CO2 24 08/24/2023   Lab Results  Component Value Date   ALT 20 08/24/2023   AST 21 08/24/2023   ALKPHOS 78 08/24/2023   BILITOT 0.4 08/24/2023   Lab Results  Component Value Date   HGBA1C 5.7 (H) 08/24/2023   HGBA1C 5.4 07/21/2023   HGBA1C 5.3 05/12/2023   HGBA1C 5.4 04/22/2023   HGBA1C 5.3 03/07/2023   Lab Results  Component Value Date   INSULIN  6.2 08/24/2023   INSULIN  6.2 05/12/2023   INSULIN  6.5 03/07/2023   INSULIN  7.9 06/21/2022   INSULIN  3.8 12/28/2021   Lab Results  Component Value  Date   TSH 1.710 05/12/2023   Lab Results  Component Value Date   CHOL 144 03/07/2023   HDL 42 03/07/2023   LDLCALC 89 03/07/2023   TRIG 66 03/07/2023   CHOLHDL 3.4 03/07/2023   Lab Results  Component Value Date   VD25OH 35.3 08/24/2023   VD25OH 45.8 05/12/2023   VD25OH 41.0 03/07/2023   Lab Results  Component Value Date   WBC 13.8 (H) 12/13/2022   HGB 13.7 12/13/2022   HCT 42.0 12/13/2022   MCV 90.9 12/13/2022   PLT 316 12/13/2022   Lab Results  Component Value Date   IRON 53 10/22/2022   TIBC 253 10/22/2022   FERRITIN 295 (H) 10/22/2022   Attestation Statements:   Reviewed by clinician on day of visit: allergies, medications, problem list, medical history, surgical history, family history, social history, and previous encounter notes.  I have reviewed the above  documentation for accuracy and completeness, and I agree with the above. -  Zameria Vogl d. Traveion Ruddock, NP-C

## 2024-01-12 ENCOUNTER — Ambulatory Visit: Admitting: Physical Medicine and Rehabilitation

## 2024-01-12 ENCOUNTER — Encounter (INDEPENDENT_AMBULATORY_CARE_PROVIDER_SITE_OTHER): Payer: Self-pay | Admitting: Adult Health

## 2024-01-15 ENCOUNTER — Other Ambulatory Visit: Payer: Self-pay | Admitting: Nurse Practitioner

## 2024-01-15 DIAGNOSIS — Z0000 Encounter for general adult medical examination without abnormal findings: Secondary | ICD-10-CM

## 2024-01-16 ENCOUNTER — Telehealth (INDEPENDENT_AMBULATORY_CARE_PROVIDER_SITE_OTHER): Payer: Self-pay

## 2024-01-16 ENCOUNTER — Other Ambulatory Visit (INDEPENDENT_AMBULATORY_CARE_PROVIDER_SITE_OTHER): Payer: Self-pay

## 2024-01-16 DIAGNOSIS — E0822 Diabetes mellitus due to underlying condition with diabetic chronic kidney disease: Secondary | ICD-10-CM

## 2024-01-16 DIAGNOSIS — E1140 Type 2 diabetes mellitus with diabetic neuropathy, unspecified: Secondary | ICD-10-CM

## 2024-01-16 MED ORDER — FREESTYLE LIBRE 3 PLUS SENSOR MISC: 0 refills | Status: DC

## 2024-01-16 MED ORDER — DEXCOM G6 RECEIVER DEVI: 0 refills | Status: DC

## 2024-01-16 MED ORDER — DEXCOM G6 TRANSMITTER MISC
0 refills | Status: DC
Start: 2024-01-16 — End: 2024-01-17

## 2024-01-16 MED ORDER — DEXCOM G6 SENSOR MISC: 0 refills | Status: DC

## 2024-01-16 NOTE — Telephone Encounter (Signed)
 PA for [facility] has been submitted, awaiting PA questions.    Questions answered for [facility].

## 2024-01-16 NOTE — Telephone Encounter (Signed)
 PA for [facility] has been denied. PA is now complete.      Regarding member: [id number]; Betty Cain; 1975-10-07 Dear Dr. rockie Dalton: [employer] reviewed your FREESTYLE LIBRE 3 PLUS SENSOR request for the aboveidentified member, and it is denied for the following reason: because we did not see what we  need to approve the device you asked for, [facility] Libre 3 Plus sensor). We may be able to  approve this device in a certain situation (when you will not use more than one device like this  [therapeutic continuous glucose monitoring system] at the same time). We do not see that this  applies to you. If this applies to you, we may need more information. We based this decision  on your health plan's prior authorization clinical criteria named Therapeutic Continuous  Glucose Monitoring Systems (CGM) and Related Supplies. If you have any questions, contact the [facility] Department at [phone].  If you would like to speak to the clinician/physician who made this decision, please call  [phone] and request a peer-to-peer review.  We will also mail you a copy of this denial letter with instructions on how to appeal.

## 2024-01-16 NOTE — Addendum Note (Signed)
 Addended by: CHESLEY NIELS CROME on: 01/16/2024 04:06 PM   Modules accepted: Orders

## 2024-01-17 MED ORDER — FREESTYLE LIBRE 3 PLUS SENSOR MISC: Status: DC

## 2024-01-17 NOTE — Telephone Encounter (Signed)
 I resubmitted PA with correct information and changed prescription to correct monitor covered my patients insurance.

## 2024-01-17 NOTE — Addendum Note (Signed)
 Addended by: CHESLEY NIELS CROME on: 01/17/2024 09:11 AM   Modules accepted: Orders

## 2024-01-19 ENCOUNTER — Other Ambulatory Visit: Payer: Self-pay | Admitting: Nurse Practitioner

## 2024-01-20 ENCOUNTER — Ambulatory Visit: Payer: Self-pay | Admitting: Nurse Practitioner

## 2024-01-20 ENCOUNTER — Encounter: Payer: Self-pay | Admitting: Nurse Practitioner

## 2024-01-20 VITALS — BP 109/65 | HR 92 | Resp 16 | Wt 140.6 lb

## 2024-01-20 DIAGNOSIS — E559 Vitamin D deficiency, unspecified: Secondary | ICD-10-CM

## 2024-01-20 DIAGNOSIS — Z23 Encounter for immunization: Secondary | ICD-10-CM

## 2024-01-20 DIAGNOSIS — Z1329 Encounter for screening for other suspected endocrine disorder: Secondary | ICD-10-CM

## 2024-01-20 DIAGNOSIS — R079 Chest pain, unspecified: Secondary | ICD-10-CM

## 2024-01-20 DIAGNOSIS — E1165 Type 2 diabetes mellitus with hyperglycemia: Secondary | ICD-10-CM

## 2024-01-20 DIAGNOSIS — E1140 Type 2 diabetes mellitus with diabetic neuropathy, unspecified: Secondary | ICD-10-CM

## 2024-01-20 DIAGNOSIS — D649 Anemia, unspecified: Secondary | ICD-10-CM

## 2024-01-20 LAB — POCT GLYCOSYLATED HEMOGLOBIN (HGB A1C): Hemoglobin A1C: 5.5 % (ref 4.0–5.6)

## 2024-01-20 MED ORDER — BISOPROLOL FUMARATE 5 MG PO TABS: 5.0000 mg | ORAL_TABLET | Freq: Two times a day (BID) | ORAL | 3 refills | Status: DC

## 2024-01-20 MED ORDER — VITAMIN D (ERGOCALCIFEROL) 1.25 MG (50000 UNIT) PO CAPS: 50000.0000 [IU] | ORAL_CAPSULE | ORAL | 2 refills | Status: DC

## 2024-01-20 MED ORDER — BLOOD GLUCOSE MONITOR KIT: PACK | 0 refills | Status: AC

## 2024-01-20 MED ORDER — ONDANSETRON 4 MG PO TBDP: 4.0000 mg | ORAL_TABLET | Freq: Three times a day (TID) | ORAL | 0 refills | Status: DC | PRN

## 2024-01-20 MED ORDER — CENTRUM FRESH/FRUITY ADULT PO CHEW: 1.0000 | CHEWABLE_TABLET | Freq: Every day | ORAL | 2 refills | Status: AC

## 2024-01-20 MED ORDER — FERROUS SULFATE 325 (65 FE) MG PO TABS: ORAL_TABLET | ORAL | 3 refills | Status: AC

## 2024-01-20 NOTE — Patient Instructions (Signed)

## 2024-01-20 NOTE — Progress Notes (Signed)
 Subjective   Patient ID: Betty Cain, female    DOB: 10-26-1975, 48 y.o.   MRN: [id number]  Chief Complaint  Patient presents with   Diabetes   Chest Pain    Started 3 days ago  Chest tightness Sometime sob   Dizziness    When sitting up  Been going on for a long time     Referring provider: Oley Bascom RAMAN, NP  Betty Cain is a 48 y.o. 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  No date: Palpitations No date: Shortness of breath No date: Shortness of breath on exertion No date: Spinal headache No date: Swelling of both lower extremities No date: Valvular heart disease 02/2019: Vitamin D  deficiency   HPI  Patient presents today for follow-up visit. Recent A1C was 5.7. Patient is following with pain management. States that it is helping. Also following with weight loss clinic. Patient is having episodes of heart racing and intermittent chest pain. Discussed that she does need to speak with her cardiologist about this.  Patient complains of peripheral edema and her lower extremities being cold.  We discussed that she does need to follow-up with vascular about this.  Denies f/c/s, n/v/d, hemoptysis, PND, leg swelling. Denies chest pain or edema.   EKG: NSR    Allergies  Allergen Reactions   Seasonal Ic [Octacosanol]     Immunization History  Administered Date(s) Administered   Influenza,inj,Quad PF,6+ Mos 06/19/2013, 12/02/2015   PFIZER(Purple  Top)SARS-COV-2 Vaccination 07/07/2019, 07/28/2019   Pneumococcal Conjugate-13 05/21/2019   Tdap 09/13/2013, 12/11/2013    Tobacco History: Social History   Tobacco Use  Smoking Status Never  Smokeless Tobacco Never   Counseling given: Not Answered   Outpatient Encounter Medications as of 01/20/2024  Medication Sig   multivitamin (CENTRUM) chewable tablet Chew 1 tablet by mouth daily.   Vitamin D , Ergocalciferol , (DRISDOL ) 1.25 MG (50000 UNIT) CAPS capsule Take 1 capsule (50,000 Units total) by mouth every 7 (seven) days.   Accu-Chek Softclix Lancets lancets USE AS DIRECTED   acetaminophen  (TYLENOL ) 500 MG tablet Take 500 mg by mouth every 6 (six) hours as needed.   aspirin EC 81 MG tablet Take 81 mg by mouth daily. Swallow whole.   bisoprolol  (ZEBETA ) 5 MG tablet Take 1 tablet (5 mg total) by mouth 2 (two) times daily.   blood glucose meter kit and supplies KIT Dispense based on patient and insurance preference. Use up to four times daily as directed.   Blood Pressure Monitoring DEVI 1 each by Does not apply route daily.   Cholecalciferol  (VITAMIN D3) 125 MCG (5000 UT) CAPS Take 1 capsule (5,000 Units total) by mouth daily.   cloNIDine  (CATAPRES ) 0.1 MG tablet Take 1 tablet by mouth twice daily   Continuous Glucose Sensor (FREESTYLE LIBRE 3 PLUS SENSOR) MISC Change sensor every 15 days.   docusate sodium  (COLACE) 100 MG capsule Take 1 capsule (100 mg total) by mouth 2 (two) times daily.   DULoxetine  (CYMBALTA ) 60 MG capsule  Take 1 capsule (60 mg total) by mouth daily.   ferrous sulfate  (FEROSUL) 325 (65 FE) MG tablet TAKE 1 TABLET (325 MG TOTAL) BY MOUTH DAILY.   fluticasone  (FLONASE ) 50 MCG/ACT nasal spray Use 2 spray(s) in each nostril once daily   Fremanezumab -vfrm (AJOVY ) 225 MG/1.5ML SOAJ Inject 225 mg into the skin every 30 (thirty) days.   glucose blood (ACCU-CHEK GUIDE) test strip Use as instructed   ibuprofen  (ADVIL ) 800 MG tablet Take 800 mg by mouth every 6 (six) hours as  needed.   JOURNAVX  50 MG TABS TAKE 1 TABLET BY MOUTH ONCE DAILY AS NEEDED   KLOR-CON  M20 20 MEQ tablet Take 1 tablet by mouth once daily   levocetirizine (XYZAL ) 5 MG tablet TAKE 1 TABLET BY MOUTH ONCE DAILY IN THE EVENING   lidocaine  (LIDODERM ) 5 % USE 1 PATCH EXTERNALLY ONCE DAILY. REMOVE & DISCARD PATCH WITHIN 12 HOURS OR AS DIRECTED BY MD   metFORMIN  (GLUCOPHAGE -XR) 500 MG 24 hr tablet Take 1 tab daily with largest meal TAKE WITH FOOD   methocarbamol  (ROBAXIN -750) 750 MG tablet Take 1 tablet (750 mg total) by mouth at bedtime as needed for muscle spasms.   methylcellulose (CITRUCEL) oral powder Take as directed, daily   Multiple Vitamin (MULTI VITAMIN DAILY PO) Take by mouth.   ondansetron  (ZOFRAN ) 4 MG tablet TAKE 1 TABLET BY MOUTH EVERY 8 HOURS AS NEEDED FOR NAUSEA FOR VOMITING (Patient not taking: Reported on 01/09/2024)   ondansetron  (ZOFRAN -ODT) 4 MG disintegrating tablet Take 1 tablet (4 mg total) by mouth every 8 (eight) hours as needed for nausea or vomiting.   pantoprazole  (PROTONIX ) 40 MG tablet Take 40 mg by mouth 2 (two) times daily.   pregabalin  (LYRICA ) 50 MG capsule Take 1 capsule by mouth at bedtime   Probiotic Product (ALIGN EXTRA STRENGTH) CAPS Take 1 capsule by mouth daily.   Rimegepant Sulfate  75 MG TBDP Take 1 tab at onset of migraine. May repeat in 2 hrs, if needed. Max dose: 2 tabs/day or 15/month. This is a 90 day rx.   rosuvastatin  (CRESTOR ) 5 MG tablet TAKE 1 TABLET BY MOUTH AT BEDTIME *APPOINTMENT REQUIRED FOR FURTHER REFILLS*   Semaglutide ,0.25 or 0.5MG /DOS, (OZEMPIC , 0.25 OR 0.5 MG/DOSE,) 2 MG/3ML SOPN Inject 0.25 mg into the skin once a week.   sucralfate  (CARAFATE ) 1 g tablet Take 1 tablet (1 g total) by mouth in the morning, at noon, and at bedtime. SLOWLY DISSOLVE 1 TABLET IN 1 TABLETSPOON OF DISTILLED WATER PRIOR TO INGESTION. Take three times daily (Patient taking differently: Take 1 g by mouth 3 (three) times daily as needed. SLOWLY DISSOLVE 1 TABLET IN 1  TABLETSPOON OF DISTILLED WATER PRIOR TO INGESTION. Take three times daily)   topiramate  (TOPAMAX ) 25 MG tablet Take 1 tablet (25 mg total) by mouth at bedtime.   UNABLE TO FIND Take 1 capsule by mouth daily.   VENTOLIN  HFA 108 (90 Base) MCG/ACT inhaler INHALE 2 PUFFS BY MOUTH EVERY 6 HOURS AS NEEDED FOR WHEEZING OR SHORTNESS OF BREATH   [DISCONTINUED] bisoprolol  (ZEBETA ) 5 MG tablet Take 1 tablet (5 mg total) by mouth 2 (two) times daily.   [DISCONTINUED] blood glucose meter kit and supplies KIT Dispense based on patient and insurance preference. Use up to four times daily as directed.   [DISCONTINUED] ferrous sulfate  (FEROSUL) 325 (65 FE) MG tablet TAKE 1 TABLET (325 MG TOTAL) BY MOUTH DAILY.   [DISCONTINUED] ondansetron  (ZOFRAN -ODT) 4 MG disintegrating tablet DISSOLVE 1 TABLET IN MOUTH EVERY 8  HOURS AS NEEDED   Facility-Administered Encounter Medications as of 01/20/2024  Medication   lidocaine  (XYLOCAINE ) 1 % (with pres) injection 5 mL   ondansetron  (ZOFRAN -ODT) disintegrating tablet 4 mg    Review of Systems  Review of Systems  HENT: Negative.    Gastrointestinal: Negative.   Allergic/Immunologic: Negative.   Psychiatric/Behavioral: Negative.       Objective:   BP 109/65   Pulse 92   Resp 16   Wt 140 lb 9.6 oz (63.8 kg)   SpO2 100%   BMI 29.39 kg/m   Wt Readings from Last 5 Encounters:  01/20/24 140 lb 9.6 oz (63.8 kg)  01/09/24 138 lb (62.6 kg)  12/19/23 136 lb (61.7 kg)  12/06/23 136 lb 9.6 oz (62 kg)  12/05/23 134 lb (60.8 kg)     Physical Exam Vitals and nursing note reviewed.  Constitutional:      General: She is not in acute distress.    Appearance: She is well-developed.  Cardiovascular:     Rate and Rhythm: Regular rhythm.  Pulmonary:     Effort: Pulmonary effort is normal.     Breath sounds: Normal breath sounds.  Neurological:     Mental Status: She is alert and oriented to person, place, and time.       Assessment & Plan:   Type 2 diabetes  mellitus with hyperglycemia, without long-term current use of insulin  (HCC) -     POCT glycosylated hemoglobin (Hb A1C) -     CBC -     Comprehensive metabolic panel with GFR -     Hemoglobin A1c  Intermittent chest pain -     EKG 12-Lead -     Ambulatory referral to Cardiology  Need for influenza vaccination -     Flu vaccine trivalent PF, 6mos and older(Flulaval,Afluria,Fluarix,Fluzone)  Thyroid  disorder screen -     TSH  Anemia, unspecified type -     Ferrous Sulfate ; TAKE 1 TABLET (325 MG TOTAL) BY MOUTH DAILY.  Dispense: 90 tablet; Refill: 3  Type 2 diabetes mellitus with diabetic neuropathy, without long-term current use of insulin  (HCC) -     blood glucose meter kit and supplies; Dispense based on patient and insurance preference. Use up to four times daily as directed.  Dispense: 1 each; Refill: 0  Vitamin D  deficiency -     VITAMIN D  25 Hydroxy (Vit-D Deficiency, Fractures) -     Vitamin D  (Ergocalciferol ); Take 1 capsule (50,000 Units total) by mouth every 7 (seven) days.  Dispense: 5 capsule; Refill: 2  Other orders -     Bisoprolol  Fumarate; Take 1 tablet (5 mg total) by mouth 2 (two) times daily.  Dispense: 180 tablet; Refill: 3 -     Ondansetron ; Take 1 tablet (4 mg total) by mouth every 8 (eight) hours as needed for nausea or vomiting.  Dispense: 20 tablet; Refill: 0 -     Centrum Fresh/Fruity Adult; Chew 1 tablet by mouth daily.  Dispense: 90 tablet; Refill: 2     Return in about 3 months (around 04/21/2024).   Bascom GORMAN Borer, NP 01/20/2024

## 2024-01-21 LAB — HEMOGLOBIN A1C
Est. average glucose Bld gHb Est-mCnc: 111 mg/dL
Hgb A1c MFr Bld: 5.5 % (ref 4.8–5.6)

## 2024-01-21 LAB — COMPREHENSIVE METABOLIC PANEL WITH GFR
ALT: 17 IU/L (ref 0–32)
AST: 19 IU/L (ref 0–40)
Albumin: 4.6 g/dL (ref 3.9–4.9)
Alkaline Phosphatase: 89 IU/L (ref 41–116)
BUN/Creatinine Ratio: 17 (ref 9–23)
BUN: 13 mg/dL (ref 6–24)
Bilirubin Total: 0.4 mg/dL (ref 0.0–1.2)
CO2: 22 mmol/L (ref 20–29)
Calcium: 9.7 mg/dL (ref 8.7–10.2)
Chloride: 105 mmol/L (ref 96–106)
Creatinine, Ser: 0.76 mg/dL (ref 0.57–1.00)
Globulin, Total: 3 g/dL (ref 1.5–4.5)
Glucose: 97 mg/dL (ref 70–99)
Potassium: 4.5 mmol/L (ref 3.5–5.2)
Sodium: 142 mmol/L (ref 134–144)
Total Protein: 7.6 g/dL (ref 6.0–8.5)
eGFR: 98 mL/min/1.73 (ref >59)

## 2024-01-21 LAB — CBC
Hematocrit: 39 % (ref 34.0–46.6)
Hemoglobin: 12.8 g/dL (ref 11.1–15.9)
MCH: 30 pg (ref 26.6–33.0)
MCHC: 32.8 g/dL (ref 31.5–35.7)
MCV: 91 fL (ref 79–97)
Platelets: 332 x10E3/uL (ref 150–450)
RBC: 4.27 x10E6/uL (ref 3.77–5.28)
RDW: 13.2 % (ref 11.7–15.4)
WBC: 5.9 x10E3/uL (ref 3.4–10.8)

## 2024-01-21 LAB — TSH: TSH: 2.23 u[IU]/mL (ref 0.450–4.500)

## 2024-01-21 LAB — VITAMIN D 25 HYDROXY (VIT D DEFICIENCY, FRACTURES): Vit D, 25-Hydroxy: 55.3 ng/mL (ref 30.0–100.0)

## 2024-01-23 ENCOUNTER — Ambulatory Visit: Payer: Self-pay | Admitting: Nurse Practitioner

## 2024-01-25 ENCOUNTER — Ambulatory Visit (INDEPENDENT_AMBULATORY_CARE_PROVIDER_SITE_OTHER): Admitting: Adult Health

## 2024-01-25 ENCOUNTER — Encounter (INDEPENDENT_AMBULATORY_CARE_PROVIDER_SITE_OTHER): Payer: Self-pay | Admitting: Adult Health

## 2024-01-25 VITALS — BP 94/63 | HR 85 | Temp 99.2°F | Ht <= 58 in | Wt 138.0 lb

## 2024-01-25 DIAGNOSIS — R109 Unspecified abdominal pain: Secondary | ICD-10-CM | POA: Diagnosis not present

## 2024-01-25 DIAGNOSIS — E66811 Obesity, class 1: Secondary | ICD-10-CM

## 2024-01-25 DIAGNOSIS — E1140 Type 2 diabetes mellitus with diabetic neuropathy, unspecified: Secondary | ICD-10-CM | POA: Diagnosis not present

## 2024-01-25 DIAGNOSIS — E669 Obesity, unspecified: Secondary | ICD-10-CM | POA: Diagnosis not present

## 2024-01-25 DIAGNOSIS — G8929 Other chronic pain: Secondary | ICD-10-CM | POA: Diagnosis not present

## 2024-01-25 DIAGNOSIS — I152 Hypertension secondary to endocrine disorders: Secondary | ICD-10-CM

## 2024-01-25 DIAGNOSIS — E1159 Type 2 diabetes mellitus with other circulatory complications: Secondary | ICD-10-CM | POA: Diagnosis not present

## 2024-01-25 DIAGNOSIS — Z6828 Body mass index (BMI) 28.0-28.9, adult: Secondary | ICD-10-CM | POA: Diagnosis not present

## 2024-01-25 DIAGNOSIS — Z7985 Long-term (current) use of injectable non-insulin antidiabetic drugs: Secondary | ICD-10-CM

## 2024-01-25 MED ORDER — OZEMPIC (0.25 OR 0.5 MG/DOSE) 2 MG/3ML ~~LOC~~ SOPN: 0.2500 mg | PEN_INJECTOR | SUBCUTANEOUS | 0 refills | Status: DC

## 2024-01-25 MED ORDER — BISOPROLOL FUMARATE 5 MG PO TABS: ORAL_TABLET | ORAL | Status: AC

## 2024-01-25 NOTE — Progress Notes (Signed)
 WEIGHT SUMMARY AND BIOMETRICS  Vitals Temp: 99.2 F (37.3 C) BP: 94/63 Pulse Rate: 85 SpO2: 100 %   Anthropometric Measurements Height: 4' 10 (1.473 m) Weight: 138 lb (62.6 kg) BMI (Calculated): 28.85 Weight at Last Visit: 138 lb Weight Lost Since Last Visit: 0 Weight Gained Since Last Visit: 0 Starting Weight: 152 lb Total Weight Loss (lbs): 14 lb (6.35 kg)   Body Composition  Body Fat %: 40.1 % Fat Mass (lbs): 55.4 lbs Muscle Mass (lbs): 78.4 lbs Total Body Water (lbs): 56.4 lbs Visceral Fat Rating : 8   Other Clinical Data Fasting: no Labs: no Today's Visit #: 50 Starting Date: 01/10/20    Chief Complaint:   OBESITY Betty Cain is here to discuss her progress with her obesity treatment plan.  She is on the the Category 2 Plan and states she is following her eating plan approximately 50 % of the time.  She states she is exercising: None  Interim History:  01/20/2024: Chronic f/u with her estsablished PCP, labs completed- reviewed with pt today.  She was recently restarted on Ozempic  0.25mg  on/about 01/11/2024 She believes she has had 2 or 3 injections- denies GI upest  Reviewed at length that is any abdominal pain or N/V or D/C develop to stop GLP-1 and contact [facility].  Of Note- [facility] Interpretor at [facility] Additional time is needed for prolonged conversation between provider-pt-interpretor  Subjective:   1. HTN with T2D 01/20/2024: Chronic f/u with her estsablished PCP, labs completed. BP soft and trending down. Cards previously  manages BB, PCP recently refilled Bisoprolol  5 mg BID She reports increased fatigue and intermittent dizziness. She was recently restarted on Ozempic  0.25mg  on/about 01/11/2024 She believes she has had 2 or 3 injections- denies GI upest  2. Type 2 diabetes mellitus with diabetic neuropathy, without long-term current use of insulin  (HCC) 01/09/2024 restarted on weekly Ozempic  0.25mg  She was continued on daily Metformin  XR  500mg  with food Denies mass in neck, dysphagia, dyspepsia, persistent hoarseness, abdominal pain, or N/V/C   3. Other chronic pain She reports chronic pain is well controlled since her parents have moved in with her and her family from [place]. She is not exercising regularly- discussed strategies to increase daily activity  4. Abdominal pain, unspecified abdominal location She denies any acute GI upset since restarting loading dose ozempic  0.25mg  Discussed in detail that if ANY GI upset develops to contact [facility] immediately  Assessment/Plan:   HTN with T2D Reduce  bisoprolol  (ZEBETA ) 5 MG tablet Either take 1 tab daily or 1/2 tab twice daily= 5mg  total 24 hr dose   Remain well hydrated Monitor home BP and for sx's of hypotension PCP sent in BP cuff orders last week F/u with PCP  2. Type 2 diabetes mellitus with diabetic neuropathy, without long-term current use of insulin  (HCC) Refill Semaglutide ,0.25 or 0.5MG /DOS, (OZEMPIC , 0.25 OR 0.5 MG/DOSE,) 2 MG/3ML SOPN Inject 0.25 mg into the skin once a week. Dispense: 3 mL, Refills: 0 ordered   3. Other chronic pain Continue to increase activity as tolerated  4. Abdominal pain, unspecified abdominal location Monitor for sx's and if ANY DEVELOP  5. Obesity, current BMI 28.8  [name] is not currently in the action stage of change. As such, her goal is to get back to weightloss efforts . She has agreed to the Category 2 Plan.   Exercise goals: All adults should avoid inactivity. Some physical activity is better than none, and adults who participate in any  amount of physical activity gain some health benefits. Adults should also include muscle-strengthening activities that involve all major muscle groups on 2 or more days a week. Walk daily, at least 10 mins  Behavioral modification strategies: increasing lean protein intake, decreasing simple carbohydrates, increasing vegetables, increasing water intake, no skipping meals, meal planning  and cooking strategies, keeping healthy foods in the home, ways to avoid boredom eating, and planning for success.  Nellene has agreed to follow-up with our clinic in 4 weeks. She was informed of the importance of frequent follow-up visits to maximize her success with intensive lifestyle modifications for her multiple health conditions.   Objective:   Blood pressure 94/63, pulse 85, temperature 99.2 F (37.3 C), height 4' 10 (1.473 m), weight 138 lb (62.6 kg), SpO2 100%. Body mass index is 28.84 kg/m.  General: Cooperative, alert, well developed, in no acute distress. HEENT: Conjunctivae and lids unremarkable. Cardiovascular: Regular rhythm.  Lungs: Normal work of breathing. Neurologic: No focal deficits.   Lab Results  Component Value Date   CREATININE 0.76 01/20/2024   BUN 13 01/20/2024   NA 142 01/20/2024   K 4.5 01/20/2024   CL 105 01/20/2024   CO2 22 01/20/2024   Lab Results  Component Value Date   ALT 17 01/20/2024   AST 19 01/20/2024   ALKPHOS 89 01/20/2024   BILITOT 0.4 01/20/2024   Lab Results  Component Value Date   HGBA1C 5.5 01/20/2024   HGBA1C 5.5 01/20/2024   HGBA1C 5.7 (H) 08/24/2023   HGBA1C 5.4 07/21/2023   HGBA1C 5.3 05/12/2023   Lab Results  Component Value Date   INSULIN  6.2 08/24/2023   INSULIN  6.2 05/12/2023   INSULIN  6.5 03/07/2023   INSULIN  7.9 06/21/2022   INSULIN  3.8 12/28/2021   Lab Results  Component Value Date   TSH 2.230 01/20/2024   Lab Results  Component Value Date   CHOL 144 03/07/2023   HDL 42 03/07/2023   LDLCALC 89 03/07/2023   TRIG 66 03/07/2023   CHOLHDL 3.4 03/07/2023   Lab Results  Component Value Date   VD25OH 55.3 01/20/2024   VD25OH 35.3 08/24/2023   VD25OH 45.8 05/12/2023   Lab Results  Component Value Date   WBC 5.9 01/20/2024   HGB 12.8 01/20/2024   HCT 39.0 01/20/2024   MCV 91 01/20/2024   PLT 332 01/20/2024   Lab Results  Component Value Date   IRON 53 10/22/2022   TIBC 253 10/22/2022    FERRITIN 295 (H) 10/22/2022   Attestation Statements:   Reviewed by clinician on day of visit: allergies, medications, problem list, medical history, surgical history, family history, social history, and previous encounter notes.  Additional time is needed for prolonged conversation between provider-pt-interpretor  I have reviewed the above documentation for accuracy and completeness, and I agree with the above. -  Kire Ferg d. Efosa Treichler, NP-c

## 2024-01-26 ENCOUNTER — Other Ambulatory Visit: Payer: Self-pay | Admitting: Physical Medicine and Rehabilitation

## 2024-01-26 ENCOUNTER — Other Ambulatory Visit (INDEPENDENT_AMBULATORY_CARE_PROVIDER_SITE_OTHER): Payer: Self-pay | Admitting: Adult Health

## 2024-01-26 DIAGNOSIS — E1140 Type 2 diabetes mellitus with diabetic neuropathy, unspecified: Secondary | ICD-10-CM

## 2024-01-26 DIAGNOSIS — M545 Low back pain, unspecified: Secondary | ICD-10-CM

## 2024-02-09 ENCOUNTER — Other Ambulatory Visit: Payer: Self-pay | Admitting: Adult Health

## 2024-02-11 ENCOUNTER — Other Ambulatory Visit: Payer: Self-pay | Admitting: Physical Medicine and Rehabilitation

## 2024-02-11 ENCOUNTER — Other Ambulatory Visit (INDEPENDENT_AMBULATORY_CARE_PROVIDER_SITE_OTHER): Payer: Self-pay | Admitting: Adult Health

## 2024-02-11 ENCOUNTER — Other Ambulatory Visit: Payer: Self-pay | Admitting: Nurse Practitioner

## 2024-02-11 DIAGNOSIS — Z0000 Encounter for general adult medical examination without abnormal findings: Secondary | ICD-10-CM

## 2024-02-13 ENCOUNTER — Encounter: Payer: Self-pay | Admitting: Adult Health

## 2024-02-13 ENCOUNTER — Ambulatory Visit: Payer: Medicaid Other | Admitting: Adult Health

## 2024-02-13 ENCOUNTER — Other Ambulatory Visit: Payer: Self-pay | Admitting: Nurse Practitioner

## 2024-02-13 VITALS — BP 107/75 | HR 95 | Ht <= 58 in | Wt 143.0 lb

## 2024-02-13 DIAGNOSIS — Z0000 Encounter for general adult medical examination without abnormal findings: Secondary | ICD-10-CM

## 2024-02-13 DIAGNOSIS — G43109 Migraine with aura, not intractable, without status migrainosus: Secondary | ICD-10-CM | POA: Diagnosis not present

## 2024-02-13 NOTE — Progress Notes (Signed)
 PATIENT: Betty Cain DOB: [birth year]  REASON FOR VISIT: follow up HISTORY FROM: patient, interpreter present   Chief Complaint  Patient presents with   RM 18    Migraine; reports headaches ~3x month; alone    HISTORY OF PRESENT ILLNESS: Today 02/13/24:  Betty Cain is a 48 y.o. female with a history of migraine headaches. Returns today for follow-up.  She reports that she is having approximately 3 migraines a month.  She continues on Ajovy .  Reports that after taking Nurtec her to headache typically responds in about an hour.  In the past she has tried Maxalt  without good benefit.  She is seeing physical medicine for possible fibromyalgia.  Was doing trigger point injections without much benefit.  In the past the patient has never reported an aura with her migraines although today she states that she does get flashing lights in her vision with her migraines.  Patient states that she would like a medication that resolves her headaches faster.  She returns today for an evaluation.  Location: occipital  Frequency: 3/month Duration: 1 hour after nurtec  Aura: yes flashing lights Photophonia:yes  Phonophobia:yes  Nausea: yes  Vomiting: yes  Numbness: no Weakness: no Visual changes:yes -- blurry vision  Memory: yes    Imaging: IMPRESSION: This is a normal MRI of the brain with and without contrast.      02/09/23: Betty Cain is a 48 y.o. female with a history of migraine headaches. Returns today for follow-up.  She reports overall Ajovy  and Nurtec is working well for her.  Has approximately 3 migraines a month.  She tends to notice more headaches when she is due for Ajovy .  She states Nurtec will work well for her.  She does note some nausea with her headaches.  She states the ED at 1 point gave her Zofran  and that worked well.  She is asking for a refill.     07/05/22: Betty Cain is a 48 y.o. female with a history of Migraine headaches. Returns today for follow-up.  1-3  times a month. Continues to take nurtec and it does help. Takes about 1-2 hours and her headache will resolve.  She states that in the last 3 months her migraines have started suddenly associated with some dizziness.  However they still respond well to Nurtec.  The frequency of her headaches have not changed.  This month she is fasting for Ramadan.  She returns today for an evaluation.   06/29/21: Betty Cain is a 48 year old female with a history of migraine headaches.  She returns today for follow-up.  Continues Ajovy  and reports that it works well. Takes nurtec when she gets a headache. Reports headache will resolve after rest in addition to nurtec. Not sure of her headache frequency but knows that it's better with medication.   Patient reports that she has generalized pain all over.  Reviewing her PCP note from February it looks as a diagnosis of fibromyalgia was being considered.  The patient reports that she was told to follow-up with neurology.   Betty Cain is a 48 year old female with a history of migraine headaches.  She is currently on Ajovy  monthly injections.  She also takes Nurtec for abortive therapy.  At the last visit tizanidine  was added on for abortive therapy.  Patient reports that her headaches have worsened.  She states that she has a daily headache that is typically mild.  She states 3 days out of the week it is severe.  Patient  reports that she tried tizanidine  but she does not remember how helpful it was.  She reports that Nurtec offers her temporary relief.  HISTORY (copied from Betty Cain): 06/19/20 [name] is a 48 year old female with history of daily headaches.  Has tried Aimovig , is now on Ajovy  since January. Has DM, A1C was 5.7.  Currently taking gabapentin , metoprolol , nortriptyline .  Has an allergy  to Topamax . Likes Ajovy , helping more than Aimovig , migraines are improved, only 1 a month, but is more intense. It may last 5 days. Is able to continue with function, has 4  kids. Takes Nurtec, is helpful. May have nausea, but no vomiting. Is happy with how things have improved. With headaches, can have aches all over, tightness in her neck. Here with interpreter.   REVIEW OF SYSTEMS: Out of a complete 14 system review of symptoms, the patient complains only of the following symptoms, and all other reviewed systems are negative.  See HPI  ALLERGIES: Allergies  Allergen Reactions   Seasonal Ic [Octacosanol]     HOME MEDICATIONS: Outpatient Medications Prior to Visit  Medication Sig Dispense Refill   Accu-Chek Softclix Lancets lancets USE AS DIRECTED 100 each 0   acetaminophen  (TYLENOL ) 500 MG tablet Take 500 mg by mouth every 6 (six) hours as needed.     aspirin EC 81 MG tablet Take 81 mg by mouth daily. Swallow whole.     bisoprolol  (ZEBETA ) 5 MG tablet Either take 1 tab daily or 1/2 tab twice daily= 5mg  total 24 hr dose     blood glucose meter kit and supplies KIT Dispense based on patient and insurance preference. Use up to four times daily as directed. 1 each 0   Blood Pressure Monitoring DEVI 1 each by Does not apply route daily. 1 Device 0   Cholecalciferol  (VITAMIN D3) 125 MCG (5000 UT) CAPS Take 1 capsule (5,000 Units total) by mouth daily. 90 capsule 0   Continuous Glucose Sensor (FREESTYLE LIBRE 3 PLUS SENSOR) MISC CHANGE SENSOR EVERY 15 DAYS 1 each 0   docusate sodium  (COLACE) 100 MG capsule Take 1 capsule (100 mg total) by mouth 2 (two) times daily. 10 capsule 0   DULoxetine  (CYMBALTA ) 60 MG capsule Take 1 capsule (60 mg total) by mouth daily. 90 capsule 3   ferrous sulfate  (FEROSUL) 325 (65 FE) MG tablet TAKE 1 TABLET (325 MG TOTAL) BY MOUTH DAILY. 90 tablet 3   fluticasone  (FLONASE ) 50 MCG/ACT nasal spray Use 2 spray(s) in each nostril once daily 48 g 0   Fremanezumab -vfrm (AJOVY ) 225 MG/1.5ML SOAJ INJECT 225 MG INTO THE SKIN EVERY 30 DAYS 2 mL 1   glucose blood (ACCU-CHEK GUIDE) test strip Use as instructed 100 each 12   ibuprofen  (ADVIL ) 800  MG tablet Take 800 mg by mouth every 6 (six) hours as needed.     JOURNAVX  50 MG TABS TAKE 1 TABLET BY MOUTH ONCE DAILY AS NEEDED 30 tablet 0   KLOR-CON  M20 20 MEQ tablet Take 1 tablet by mouth once daily 30 tablet 0   levocetirizine (XYZAL ) 5 MG tablet TAKE 1 TABLET BY MOUTH ONCE DAILY IN THE EVENING 30 tablet 0   lidocaine  (LIDODERM ) 5 % USE 1 PATCH EXTERNALLY ONCE DAILY. REMOVE & DISCARD PATCH WITHIN 12 HOURS OR AS DIRECTED BY MD 30 patch 0   metFORMIN  (GLUCOPHAGE -XR) 500 MG 24 hr tablet Take 1 tab daily with largest meal TAKE WITH FOOD 90 tablet 0   methocarbamol  (ROBAXIN -750) 750 MG tablet Take 1 tablet (750  mg total) by mouth at bedtime as needed for muscle spasms. 90 tablet 3   methylcellulose (CITRUCEL) oral powder Take as directed, daily     Multiple Vitamin (MULTI VITAMIN DAILY PO) Take by mouth.     multivitamin (CENTRUM) chewable tablet Chew 1 tablet by mouth daily. 90 tablet 2   ondansetron  (ZOFRAN ) 4 MG tablet TAKE 1 TABLET BY MOUTH EVERY 8 HOURS AS NEEDED FOR NAUSEA FOR VOMITING 30 tablet 0   ondansetron  (ZOFRAN -ODT) 4 MG disintegrating tablet DISSOLVE 1 TABLET IN MOUTH EVERY 8 HOURS AS NEEDED 20 tablet 0   ondansetron  (ZOFRAN -ODT) 4 MG disintegrating tablet Take 1 tablet (4 mg total) by mouth every 8 (eight) hours as needed for nausea or vomiting. 20 tablet 0   pantoprazole  (PROTONIX ) 40 MG tablet Take 40 mg by mouth 2 (two) times daily.     pregabalin  (LYRICA ) 50 MG capsule Take 1 capsule by mouth at bedtime 30 capsule 0   Probiotic Product (ALIGN EXTRA STRENGTH) CAPS Take 1 capsule by mouth daily. 90 capsule 2   Rimegepant Sulfate  75 MG TBDP Take 1 tab at onset of migraine. May repeat in 2 hrs, if needed. Max dose: 2 tabs/day or 15/month. This is a 90 day rx. 45 tablet 3   rosuvastatin  (CRESTOR ) 5 MG tablet TAKE 1 TABLET BY MOUTH AT BEDTIME *APPOINTMENT REQUIRED FOR FURTHER REFILLS* 30 tablet 0   Semaglutide ,0.25 or 0.5MG /DOS, (OZEMPIC , 0.25 OR 0.5 MG/DOSE,) 2 MG/3ML SOPN Inject  0.25 mg into the skin once a week. 3 mL 0   sucralfate  (CARAFATE ) 1 g tablet Take 1 tablet (1 g total) by mouth in the morning, at noon, and at bedtime. SLOWLY DISSOLVE 1 TABLET IN 1 TABLETSPOON OF DISTILLED WATER PRIOR TO INGESTION. Take three times daily 90 tablet 5   topiramate  (TOPAMAX ) 25 MG tablet Take 1 tablet (25 mg total) by mouth at bedtime. 90 tablet 3   UNABLE TO FIND Take 1 capsule by mouth daily.     VENTOLIN  HFA 108 (90 Base) MCG/ACT inhaler INHALE 2 PUFFS BY MOUTH EVERY 6 HOURS AS NEEDED FOR WHEEZING OR SHORTNESS OF BREATH 18 g 0   Facility-Administered Medications Prior to Visit  Medication Dose Route Frequency Provider Last Rate Last Admin   lidocaine  (XYLOCAINE ) 1 % (with pres) injection 5 mL  5 mL Intradermal Once        ondansetron  (ZOFRAN -ODT) disintegrating tablet 4 mg  4 mg Oral Once         PAST MEDICAL HISTORY: Past Medical History:  Diagnosis Date   Allergy     Anemia    Anxiety 01/2019   Asthma    B12 deficiency    Back pain    Common migraine with intractable migraine 06/28/2017   Constipation    Diabetes (HCC) 02/2019   Fatigue    Fibromyalgia    GERD (gastroesophageal reflux disease)    pos H pylori   Hypertension    controlled with medication   IBS (irritable bowel syndrome) 2005   Joint pain    Neonatal death    Vaginal delivery, full term-lived x2 hours.    Palpitations    Shortness of breath    Shortness of breath on exertion    Spinal headache    Swelling of both lower extremities    Valvular heart disease    Vitamin D  deficiency 02/2019    PAST SURGICAL HISTORY: Past Surgical History:  Procedure Laterality Date   ADENOIDECTOMY     and tonsils (as  a child)   BIOPSY  08/27/2021   Procedure: BIOPSY;  Surgeon: Leigh Elspeth SQUIBB, MD;  Location: [facility];  Service: Gastroenterology;;   boil  2003   right elbow   CESAREAN SECTION     x4   CESAREAN SECTION  06/02/2011   Procedure: CESAREAN SECTION;  Surgeon: Norleen LULLA Server, MD;   Location: [facility];  Service: Gynecology;  Laterality: N/A;  Primary Cesarean Section Delivery Baby Boy @ 0004, Apgars 9/9   CESAREAN SECTION N/A 12/10/2013   Procedure: REPEAT CESAREAN SECTION;  Surgeon: Winton Felt, MD;  Location: [facility];  Service: Obstetrics;  Laterality: N/A;   CESAREAN SECTION     colonoscopy  11/23/2018   stated had issues with sleep when in for procedure   COLONOSCOPY N/A 12/19/2023   Procedure: COLONOSCOPY;  Surgeon: Leigh Elspeth SQUIBB, MD;  Location: [facility];  Service: Gastroenterology;  Laterality: N/A;  patient had difficulty breathing during anesthesia thought due to sleep apnea.   ESOPHAGOGASTRODUODENOSCOPY (EGD) WITH PROPOFOL  N/A 08/27/2021   Procedure: ESOPHAGOGASTRODUODENOSCOPY (EGD) WITH PROPOFOL ;  Surgeon: Leigh Elspeth SQUIBB, MD;  Location: [facility];  Service: Gastroenterology;  Laterality: N/A;   OTHER SURGICAL HISTORY  2005   Uterine surgery    uterine cauterization      FAMILY HISTORY: Family History  Problem Relation Age of Onset   Diabetes Mother    Diabetes Father    Diabetes Sister    Migraines Sister    Migraines Daughter    Anesthesia problems Neg Hx    Colon cancer Neg Hx    Esophageal cancer Neg Hx    Rectal cancer Neg Hx    Stomach cancer Neg Hx    Colon polyps Neg Hx     SOCIAL HISTORY: Social History   Socioeconomic History   Marital status: Married    Spouse name: Teacher, Adult Education   Number of children: 4   Years of education: Not on file   Highest education level: Some college, no degree  Occupational History   Occupation: Unemployed  Tobacco Use   Smoking status: Never   Smokeless tobacco: Never  Vaping Use   Vaping status: Never Used  Substance and Sexual Activity   Alcohol use: No   Drug use: No   Sexual activity: Yes    Birth control/protection: None    Comment: pregnant  Other Topics Concern   Not on file  Social History Narrative   Lives with husband and child   Caffeine  use: daily (tea), sometimes  coffee/soda   Right handed    Social Drivers of Health   Financial Resource Strain: Low Risk  (10/17/2023)   Overall Financial Resource Strain (CARDIA)    Difficulty of Paying Living Expenses: Not very hard  Food Insecurity: No Food Insecurity (10/17/2023)   Hunger Vital Sign    Worried About Running Out of Food in the Last Year: Never true    Ran Out of Food in the Last Year: Never true  Transportation Needs: No Transportation Needs (10/17/2023)   PRAPARE - Administrator, Civil Service (Medical): No    Lack of Transportation (Non-Medical): No  Physical Activity: Inactive (10/17/2023)   Exercise Vital Sign    Days of Exercise per Week: 0 days    Minutes of Exercise per Session: Not on file  Stress: No Stress Concern Present (10/17/2023)   [employer] of Occupational Health - Occupational Stress Questionnaire    Feeling of Stress: Not at all  Social Connections: Moderately Integrated (10/17/2023)  Social Connection and Isolation Panel    Frequency of Communication with Friends and Family: More than three times a week    Frequency of Social Gatherings with Friends and Family: Once a week    Attends Religious Services: 1 to 4 times per year    Active Member of [employer] or Organizations: No    Attends Engineer, Structural: Not on file    Marital Status: Married  Catering Manager Violence: Not on file      PHYSICAL EXAM  Vitals:   02/13/24 1020  BP: 107/75  Pulse: 95  Weight: 143 lb (64.9 kg)  Height: 4' 10 (1.473 m)   Body mass index is 29.89 kg/m.  Generalized: Well developed, in no acute distress   Neurological examination  Mentation: Alert oriented to time, place, history taking. Follows all commands speech and language fluent Cranial nerve II-XII: Pupils were equal round reactive to light. Extraocular movements were full, visual field were full on confrontational test. Facial sensation and strength were normal.  Head turning and shoulder shrug  were  normal and symmetric. Motor: The motor testing reveals 5 over 5 strength of all 4 extremities. Good symmetric motor tone is noted throughout.  Sensory: Sensory testing is intact to soft touch on all 4 extremities. No evidence of extinction is noted.  Coordination: Cerebellar testing reveals good finger-nose-finger and heel-to-shin bilaterally.  Gait and station: Gait is normal.    DIAGNOSTIC DATA (LABS, IMAGING, TESTING) - I reviewed patient records, labs, notes, testing and imaging myself where available.  Lab Results  Component Value Date   WBC 5.9 01/20/2024   HGB 12.8 01/20/2024   HCT 39.0 01/20/2024   MCV 91 01/20/2024   PLT 332 01/20/2024      Component Value Date/Time   NA 142 01/20/2024 1108   K 4.5 01/20/2024 1108   CL 105 01/20/2024 1108   CO2 22 01/20/2024 1108   GLUCOSE 97 01/20/2024 1108   GLUCOSE 100 (H) 12/13/2022 1539   GLUCOSE 77 09/21/2013 1230   BUN 13 01/20/2024 1108   CREATININE 0.76 01/20/2024 1108   CREATININE 0.58 09/14/2016 0939   CALCIUM  9.7 01/20/2024 1108   PROT 7.6 01/20/2024 1108   ALBUMIN 4.6 01/20/2024 1108   AST 19 01/20/2024 1108   ALT 17 01/20/2024 1108   ALKPHOS 89 01/20/2024 1108   BILITOT 0.4 01/20/2024 1108   GFRNONAA >60 12/13/2022 1539   GFRNONAA >89 09/14/2016 0939   GFRAA 126 01/02/2020 1027   GFRAA >89 09/14/2016 0939   Lab Results  Component Value Date   CHOL 144 03/07/2023   HDL 42 03/07/2023   LDLCALC 89 03/07/2023   TRIG 66 03/07/2023   CHOLHDL 3.4 03/07/2023   Lab Results  Component Value Date   HGBA1C 5.5 01/20/2024   Lab Results  Component Value Date   VITAMINB12 457 08/24/2023   Lab Results  Component Value Date   TSH 2.230 01/20/2024      ASSESSMENT AND PLAN 48 y.o. year old female  has a past medical history of Allergy , Anemia, Anxiety (01/2019), Asthma, B12 deficiency, Back pain, Common migraine with intractable migraine (06/28/2017), Constipation, Diabetes (HCC) (2019-03-03), Fatigue, Fibromyalgia,  GERD (gastroesophageal reflux disease), Hypertension, IBS (irritable bowel syndrome) 2004-03-02), Joint pain, Neonatal death, Palpitations, Shortness of breath, Shortness of breath on exertion, Spinal headache, Swelling of both lower extremities, Valvular heart disease, and Vitamin D  deficiency (Mar 03, 2019). here with:  1.  Migraine headaches  continue on Ajovy  monthly injections for now Continue Nurtec  for abortive therapy We did discuss sumatriptan.  I provided information on her after visit summary.  She will let me know if she wants to try this medication. Take Zofran  4 mg every 8 hours as needed for nausea Advised if symptoms worsen or she develops new symptoms she should let us  know Patient would like to establish with another MD in her office.  Next visit will be with an MD. Follow-up in 4 to 6 months or sooner if needed       Duwaine Russell, MSN, NP-C 02/13/2024, 10:30 AM [facility] [address], [address] [place], [place] [address] [phone]  The patient's condition requires frequent monitoring and adjustments in the treatment plan, reflecting the ongoing complexity of care.  This provider is the continuing focal point for all needed services for this condition.

## 2024-02-13 NOTE — Patient Instructions (Signed)
 Sumatriptan Tablets ?? ??? ??????? ????? ??????????? ?????? ??????.  ??? ???? ?? ???? ??? ?????? ????? ?????? ??????? ??????? ?? ??????.  ??? ????? ??? ?????? ?? ??????? ???? ????? ?????????.  ?? ?????? ??????? ?? ?????? ??????. ???? ??????? ??? ?????? ?????? ????? ???? ???? ??????? ?????? ?? ??????? ??? ???? ???? ?????. ????? ???????? ???????? ????????:? Imitrex, Migraine Pack ?? ?? ??????? ???? ??? ?? ???? ??? ???? ??????? ?????? ??? ????? ??? ???????  ???????? ??? ????? ?? ??? ??? ?????? ??? ?? ??? ???????:  ?????? ?????? ??????? ?? ????? ?????? ????????  ??? ??????  ????? ?????  ?????? ??? ????  ?????? ??????????  ????? ?? ??? ?????? ????? ?????  ????? ?? ?????? ????????  ????? ?????  ????? ?????  ????? ?????? ?? ???????  ????? ?????  ?? ??? ??? ???? ?? ?????? ???? ???????????? ?? ????? ????? ?? ?????? ?? ?????? ?? ???? ?????  ????? ?? ?????? ????? ??????? ???????? ??? ??? ???? ??????? ??? ??????? ????? ??? ?????? ????? ?? ??? ?? ?????. ???? ????????? ???????? ??? ???? ?????? ??????. ?? ??????? ????? ???? ?? ?????? ????????. ???? ??? ???? ?????? ???? ??????? ??? ?????? ???????. ??? ???? ???? ???? ??? ???? ????? ???? ???. ?????? ??????? : ??? ?????? ??? ?????? ???? ????? ???? ?? ??? ??????? ???? ????? ?????? ?? ?????? ?? ???? ??????? ?????.<br />??????: ?????? ??? ?????? ?? ???? ??? ???. ?? ????? ????? ??? ?? ????? ??? ??????. ???? ?? ???? (????) ????? ?? ????? ???.  ??? ?????? ??? ???? ????????? ???????. ?? ?? ??????? ???? ?? ?????? ?? ??? ???????  ?? ?????? ??? ?????? ?? ?? ??? ???:  ????? ????? ?????? ?????? ??? ???????????? ??????????? ????????????? ???????????? ???????????? ???????????? ????????????  ??????? ???????? ??? ????????????????? ?????????? ?????????? ????? ????????? ?????? ????????? ????? ??????? ??? Carbex? ?Eldepryl? ?Marplan? ?Nardil? ?Parnate  ??? ?????? ???? ?? ?????? ????? ?? ?? ???: ????? ????? ?????? ????? ??????? ??? ??????? ?? ?? ??? ?? ?????????  ????????. ?? ?????? ???? ??????? ?????? ????? ??? ??????? ?? ??????? ?? ??????? ???? ???? ?? ???????? ???????? ???? ????????. ?????? ????? ??? ??? ???? ?? ???? ?????? ?? ?????? ?????? ??? ???????. ?? ?????? ??? ??????? ?? ?????. ?? ???? ??? ???? ????? ??? ??????? ??? ??????? ?? ?????? ???? ?????? ?????? ???? ?????? ?????? ??? ?????. ???? ???? ?????? ??? ?? ???? ?????? ?? ?????? ?? ??? ?????? ?????. ?? ???? ??? ?????? ??? ??????? ??????? ?? ??? ?? ?????? ?? ?????? ??? ????? ??? ?????? ????. ?? ???? ??????? ?? ???? ???? ??? ???? ??? ???? ???? ??? ??????. ???? ?? ????? ????????? ?? ?? ???? ?????? ??? ????? ?????? ?? ???????. ?? ???? ??? ?????? ?? ??? ?????? ??? ????? ??? ??? ?????? ????????. ???? ???? ?????? ??? ????? ??? ??? ???? ?? ???? ?? ?????. ??? ?????? ????? ????? ?????? ?????? ???? 10 ???? ?? ???? ?? ?????? ??? ???? ???? ?????? ?????? ????. ???? ????? ????? ?????? ??? ???? ?????? ???????? ??????. ???? ????? ?????? ??? ????? ????? ?????? ?????? ???? ???????. ?? ?? ?????? ???????? ???? ???? ?? ??????? ??? ???? ??? ???????  ?????? ???????? ???? ??? ?? ???? ???? ??????? ??????? ?? ??? ?? ???? ??? ????:  ??????? ???????? - ????? ??????? ?? ?????? ?? ??????????? (?????)? ?? ???? ????? ?? ?????? ?? ?????? ?? ?????.  ????? ?? ???? ?? ???? ?? ?????? ?? ??? ??????? ?? ???????  ???? ?????--??? ?? ??? ?? ????? ?? ??????? ?? ???????? ?? ????? ?? ?????? ?? ??? ??????? ?? ????? ????? ?? ??????? ?? ?????? ???????? ?? ??????.  ?????? ?? ??? ????? - ???? ????? ????? ?? ??? ?????? ????? ?????? ??????? ?????? ?????? ?? ??????? ??? ?? ?????? ????? ??????  ?????? ?? ??? ????  ??? ?????--????? ?? ????? ?? ??? ????? ?????? ?? ???????? ????? ?? ????? ????? ?? ?????? ??? ?????? ??? ??????  ??????? ????????  ??????? ???????????--???? ?????? ????????? ???? ?? ??? ?????? ????? ?????? ???? ???????? ?????? ???????? ??????? ????? ?????? ????? ????????? ????? ??????? ?????????? ?????? ???????  ???? ??????--??? ?? ??? ????? ??  ????? ?? ?????? ?? ?????? ????? ?? ??????? ??????? ????? ?? ?????? ????? ??????? ?? ???????? ????? ???? ???? ???? ?? ??????  ??? ???? ?? ????? ?? ??????? ?? ???????? ?? ?????? ?? ?????? ?? ??????? ??????  ????? ????? ?????? ???????? ???? ?? ????? ????? ???? (???? ????? ?? ???? ??????? ??? ?????? ?? ???? ????? ?????):  ???? ?????? ??? ?? ????? ??? ??????? ?? ?? ??? ?? ?????? ???????? ????????. ???? ?????? ????????? ?????? ?? ?????? ????????. ????? ??????? ?? ?????? ???????? ?????? ??????? ???????? ????????? (  FDA) ??? ????? ?.[phone]??? ??? ??? ???? ???????? ??????? ???? ?????? ?? ?????? ??????? ?? ????????? ???????. ???? ?? ???? ????? ?????? ??? 2 ?30 ???? ????? (36 ?86 ????????).  ???? ?? ?? ???? ??? ?????? ??? ????? ?????? ???????? ??????? ??? ?????? ?? ??????. ??????: ??? ??????? ????? ?? ????: ?? ?? ???? ??? ???????? ?? ????????? ???????. ??? ???? ???? ?? ????? ?? ??? ??????? ????? ??? ?????? ?? ??????? ?? ???? ??????? ??????.   2024 Elsevier/Gold Standard (2022-07-08 00:00:00)

## 2024-02-18 ENCOUNTER — Other Ambulatory Visit: Payer: Self-pay | Admitting: Nurse Practitioner

## 2024-02-20 ENCOUNTER — Ambulatory Visit (INDEPENDENT_AMBULATORY_CARE_PROVIDER_SITE_OTHER): Payer: Self-pay | Admitting: Adult Health

## 2024-02-20 ENCOUNTER — Encounter (INDEPENDENT_AMBULATORY_CARE_PROVIDER_SITE_OTHER): Payer: Self-pay | Admitting: Adult Health

## 2024-02-20 VITALS — BP 112/72 | HR 55 | Temp 98.9°F | Ht <= 58 in | Wt 139.0 lb

## 2024-02-20 DIAGNOSIS — E66811 Obesity, class 1: Secondary | ICD-10-CM

## 2024-02-20 DIAGNOSIS — R109 Unspecified abdominal pain: Secondary | ICD-10-CM

## 2024-02-20 DIAGNOSIS — E1159 Type 2 diabetes mellitus with other circulatory complications: Secondary | ICD-10-CM | POA: Diagnosis not present

## 2024-02-20 DIAGNOSIS — E669 Obesity, unspecified: Secondary | ICD-10-CM

## 2024-02-20 DIAGNOSIS — Z7984 Long term (current) use of oral hypoglycemic drugs: Secondary | ICD-10-CM

## 2024-02-20 DIAGNOSIS — Z7985 Long-term (current) use of injectable non-insulin antidiabetic drugs: Secondary | ICD-10-CM | POA: Diagnosis not present

## 2024-02-20 DIAGNOSIS — Z6829 Body mass index (BMI) 29.0-29.9, adult: Secondary | ICD-10-CM | POA: Diagnosis not present

## 2024-02-20 DIAGNOSIS — E1140 Type 2 diabetes mellitus with diabetic neuropathy, unspecified: Secondary | ICD-10-CM

## 2024-02-20 DIAGNOSIS — I152 Hypertension secondary to endocrine disorders: Secondary | ICD-10-CM

## 2024-02-20 MED ORDER — METFORMIN HCL ER 500 MG PO TB24: ORAL_TABLET | ORAL | 0 refills | Status: AC

## 2024-02-20 MED ORDER — OZEMPIC (0.25 OR 0.5 MG/DOSE) 2 MG/3ML ~~LOC~~ SOPN: 0.2500 mg | PEN_INJECTOR | SUBCUTANEOUS | 0 refills | Status: DC

## 2024-02-20 NOTE — Progress Notes (Addendum)
 WEIGHT SUMMARY AND BIOMETRICS  Vitals Temp: 98.9 F (37.2 C) BP: 112/72 Pulse Rate: (!) 55 SpO2: 97 %   Anthropometric Measurements Height: 4' 10 (1.473 m) Weight: 139 lb (63 kg) BMI (Calculated): 29.06 Weight at Last Visit: 138 lb Weight Lost Since Last Visit: 0 Weight Gained Since Last Visit: 1 lb Starting Weight: 152 lb Total Weight Loss (lbs): 13 lb (5.897 kg)   Body Composition  Body Fat %: 41 % Fat Mass (lbs): 57.4 lbs Muscle Mass (lbs): 78.2 lbs Total Body Water (lbs): 57.8 lbs Visceral Fat Rating : 8   Other Clinical Data Fasting: no Labs: no Today's Visit #: 51 Starting Date: 01/10/20    Chief Complaint:   OBESITY Betty Cain is here to discuss her progress with her obesity treatment plan.  She is on the the Category 2 Plan and states she is following her eating plan approximately 50 % of the time.  She states she is exercising: None  Interim History:  She has been taking a class [employer] She has class three times weekly with a significant test later this week. Dicussed strategies to reduce test anxiety.  She has been using Libre CGM system Daily readings will range from 100-160s She has missed her last two injections due to lack of insurance ??? Last dose of Ozempic  0.25mg  was on 02/03/2024  Of Note- Betty Cain at [facility]  Subjective:   1. Type 2 diabetes mellitus with diabetic neuropathy, without long-term current use of insulin  (HCC) Lab Results  Component Value Date   HGBA1C 5.5 01/20/2024   HGBA1C 5.5 01/20/2024   HGBA1C 5.7 (H) 08/24/2023    She has been using Libre CGM system Daily readings will range from 100-160s She has missed her last two injections due to lack of insurance ??? Last dose of Ozempic  0.25mg  was on 02/03/2024 She denies any recent abdominal pain.  2. Hypertension associated with diabetes (HCC) BP stable at [facility] manages  bisoprolol  (ZEBETA ) 5 MG tablet Either take 1 tab daily or 1/2 tab  twice daily= 5mg  total 24 hr dose   3. Abdominal pain, unspecified abdominal location She has been using Libre CGM system Daily readings will range from 100-160s She has missed her last two injections due to lack of insurance ?? Last dose of Ozempic  0.25mg  was on 02/03/2024 She denies any recent abdominal pain.  Assessment/Plan:   1. Type 2 diabetes mellitus with diabetic neuropathy, without long-term current use of insulin  (HCC) (Primary) Refill - metFORMIN  (GLUCOPHAGE -XR) 500 MG 24 hr tablet; Take 1 tab daily with largest meal TAKE WITH FOOD  Dispense: 90 tablet; Refill: 0 - Semaglutide ,0.25 or 0.5MG /DOS, (OZEMPIC , 0.25 OR 0.5 MG/DOSE,) 2 MG/3ML SOPN; Inject 0.25 mg into the skin once a week.  Dispense: 3 mL; Refill: 0  2. Hypertension associated with diabetes (HCC) Limit Ma+ intake Increase weekly walking - at least three times weekly  3. Abdominal pain, unspecified abdominal location Monitor for sx's and f/u with established GI as directed/PRN  4. Obesity, current BMI 29.2  Betty Cain is currently in the action stage of change. As such, her goal is to continue with weight loss efforts. She has agreed to the Category 2 Plan.   Exercise goals: All adults should avoid inactivity. Some physical activity is better than none, and adults who participate in any amount of physical activity gain some health benefits. Adults should also include muscle-strengthening activities that involve all major muscle groups on 2 or more days  a week. Increase weekly walking 10 mins three times weekly  Behavioral modification strategies: increasing lean protein intake, decreasing simple carbohydrates, increasing vegetables, increasing water intake, no skipping meals, meal planning and cooking strategies, keeping healthy foods in the home, and planning for success.  Kaytee has agreed to follow-up with our clinic in 4 weeks. She was informed of the importance of frequent follow-up visits to maximize her  success with intensive lifestyle modifications for her multiple health conditions.   Objective:   Blood pressure 112/72, pulse (!) 55, temperature 98.9 F (37.2 C), height 4' 10 (1.473 m), weight 139 lb (63 kg), SpO2 97%. Body mass index is 29.05 kg/m.  General: Cooperative, alert, well developed, in no acute distress. HEENT: Conjunctivae and lids unremarkable. Cardiovascular: Regular rhythm.  Lungs: Normal work of breathing. Neurologic: No focal deficits.   Lab Results  Component Value Date   CREATININE 0.76 01/20/2024   BUN 13 01/20/2024   NA 142 01/20/2024   K 4.5 01/20/2024   CL 105 01/20/2024   CO2 22 01/20/2024   Lab Results  Component Value Date   ALT 17 01/20/2024   AST 19 01/20/2024   ALKPHOS 89 01/20/2024   BILITOT 0.4 01/20/2024   Lab Results  Component Value Date   HGBA1C 5.5 01/20/2024   HGBA1C 5.5 01/20/2024   HGBA1C 5.7 (H) 08/24/2023   HGBA1C 5.4 07/21/2023   HGBA1C 5.3 05/12/2023   Lab Results  Component Value Date   INSULIN  6.2 08/24/2023   INSULIN  6.2 05/12/2023   INSULIN  6.5 03/07/2023   INSULIN  7.9 06/21/2022   INSULIN  3.8 12/28/2021   Lab Results  Component Value Date   TSH 2.230 01/20/2024   Lab Results  Component Value Date   CHOL 144 03/07/2023   HDL 42 03/07/2023   LDLCALC 89 03/07/2023   TRIG 66 03/07/2023   CHOLHDL 3.4 03/07/2023   Lab Results  Component Value Date   VD25OH 55.3 01/20/2024   VD25OH 35.3 08/24/2023   VD25OH 45.8 05/12/2023   Lab Results  Component Value Date   WBC 5.9 01/20/2024   HGB 12.8 01/20/2024   HCT 39.0 01/20/2024   MCV 91 01/20/2024   PLT 332 01/20/2024   Lab Results  Component Value Date   IRON 53 10/22/2022   TIBC 253 10/22/2022   FERRITIN 295 (H) 10/22/2022   Attestation Statements:   Reviewed by clinician on day of visit: allergies, medications, problem list, medical history, surgical history, family history, social history, and previous encounter notes.  I have reviewed the  above documentation for accuracy and completeness, and I agree with the above. -  Lateisha Thurlow d. Riverlyn Kizziah, NP-C

## 2024-02-29 ENCOUNTER — Ambulatory Visit (INDEPENDENT_AMBULATORY_CARE_PROVIDER_SITE_OTHER): Payer: Self-pay | Admitting: Adult Health

## 2024-03-05 ENCOUNTER — Encounter: Attending: Physical Medicine and Rehabilitation | Admitting: Physical Medicine and Rehabilitation

## 2024-03-05 ENCOUNTER — Other Ambulatory Visit: Payer: Self-pay | Admitting: Physical Medicine and Rehabilitation

## 2024-03-05 VITALS — BP 114/75 | HR 100 | Ht <= 58 in | Wt 141.0 lb

## 2024-03-05 DIAGNOSIS — M7918 Myalgia, other site: Secondary | ICD-10-CM | POA: Diagnosis not present

## 2024-03-05 MED ORDER — LIDOCAINE HCL 1 % IJ SOLN
5.0000 mL | Freq: Once | INTRAMUSCULAR | Status: AC
  Administered 2024-03-05: 5 mL via INTRADERMAL

## 2024-03-05 MED ORDER — NALTREXONE HCL (PAIN) 4.5 MG PO CAPS: 1.0000 | ORAL_CAPSULE | Freq: Every evening | ORAL | 3 refills | Status: AC

## 2024-03-05 NOTE — Progress Notes (Signed)
 Trigger Point Injection  Indication: Cervical myofascial pain not relieved by medication management and other conservative care.  Informed consent was obtained after describing risk and benefits of the procedure with the patient, this includes bleeding, bruising, infection and medication side effects.  The patient wishes to proceed and has given written consent.  The patient was placed in a seated position.  The area of pain was marked and prepped with Betadine.  It was entered with a 25-gauge 1/2 inch needle and a total of 5 mL of 1% lidocaine  and normal saline was injected into a total of 4 trigger points, after negative draw back for blood.  The patient tolerated the procedure well.  Post procedure instructions were given.

## 2024-03-10 ENCOUNTER — Other Ambulatory Visit (INDEPENDENT_AMBULATORY_CARE_PROVIDER_SITE_OTHER): Payer: Self-pay | Admitting: Adult Health

## 2024-03-10 DIAGNOSIS — E1140 Type 2 diabetes mellitus with diabetic neuropathy, unspecified: Secondary | ICD-10-CM

## 2024-03-13 ENCOUNTER — Other Ambulatory Visit: Payer: Self-pay | Admitting: Nurse Practitioner

## 2024-03-13 ENCOUNTER — Other Ambulatory Visit: Payer: Self-pay | Admitting: Physical Medicine and Rehabilitation

## 2024-03-16 ENCOUNTER — Other Ambulatory Visit (HOSPITAL_BASED_OUTPATIENT_CLINIC_OR_DEPARTMENT_OTHER): Payer: Self-pay | Admitting: Nurse Practitioner

## 2024-03-16 DIAGNOSIS — Z1231 Encounter for screening mammogram for malignant neoplasm of breast: Secondary | ICD-10-CM

## 2024-03-19 ENCOUNTER — Encounter (INDEPENDENT_AMBULATORY_CARE_PROVIDER_SITE_OTHER): Payer: Self-pay | Admitting: Adult Health

## 2024-03-21 ENCOUNTER — Ambulatory Visit (INDEPENDENT_AMBULATORY_CARE_PROVIDER_SITE_OTHER): Admitting: Adult Health

## 2024-03-23 ENCOUNTER — Other Ambulatory Visit: Payer: Self-pay | Admitting: Nurse Practitioner

## 2024-03-26 NOTE — Telephone Encounter (Signed)
 Pt has a covid test and she will let us  know the results. KH

## 2024-03-28 ENCOUNTER — Ambulatory Visit: Attending: Physical Medicine and Rehabilitation | Admitting: Physical Therapy

## 2024-03-28 ENCOUNTER — Ambulatory Visit: Admitting: Nurse Practitioner

## 2024-03-28 ENCOUNTER — Encounter: Payer: Self-pay | Admitting: Nurse Practitioner

## 2024-03-28 ENCOUNTER — Encounter: Payer: Self-pay | Admitting: Physical Therapy

## 2024-03-28 VITALS — BP 106/69 | HR 87 | Temp 97.0°F | Wt 142.6 lb

## 2024-03-28 DIAGNOSIS — M6281 Muscle weakness (generalized): Secondary | ICD-10-CM | POA: Diagnosis present

## 2024-03-28 DIAGNOSIS — J309 Allergic rhinitis, unspecified: Secondary | ICD-10-CM | POA: Diagnosis not present

## 2024-03-28 DIAGNOSIS — M7918 Myalgia, other site: Secondary | ICD-10-CM | POA: Insufficient documentation

## 2024-03-28 DIAGNOSIS — M542 Cervicalgia: Secondary | ICD-10-CM | POA: Insufficient documentation

## 2024-03-28 DIAGNOSIS — M5413 Radiculopathy, cervicothoracic region: Secondary | ICD-10-CM | POA: Diagnosis present

## 2024-03-28 DIAGNOSIS — G8929 Other chronic pain: Secondary | ICD-10-CM

## 2024-03-28 DIAGNOSIS — R293 Abnormal posture: Secondary | ICD-10-CM | POA: Insufficient documentation

## 2024-03-28 DIAGNOSIS — L309 Dermatitis, unspecified: Secondary | ICD-10-CM | POA: Diagnosis not present

## 2024-03-28 DIAGNOSIS — R079 Chest pain, unspecified: Secondary | ICD-10-CM

## 2024-03-28 MED ORDER — PREDNISONE 20 MG PO TABS: 20.0000 mg | ORAL_TABLET | Freq: Every day | ORAL | 0 refills | Status: AC

## 2024-03-28 MED ORDER — LEVOCETIRIZINE DIHYDROCHLORIDE 5 MG PO TABS: 5.0000 mg | ORAL_TABLET | Freq: Every evening | ORAL | 1 refills | Status: AC

## 2024-03-28 MED ORDER — FLUTICASONE PROPIONATE 50 MCG/ACT NA SUSP: 2.0000 | Freq: Every day | NASAL | 2 refills | Status: AC

## 2024-03-28 NOTE — Assessment & Plan Note (Signed)
 Chronic atypical chest pain Ongoing for two days, previously evaluated by cardiologist. Follow-up needed. - Advised contacting cardiologist for follow-up appointment.

## 2024-03-28 NOTE — Progress Notes (Addendum)
 Acute Office Visit  Subjective:     Patient ID: Betty Cain, female    DOB: 06-Dec-1975, 48 y.o.   MRN: [id number]  Chief Complaint  Patient presents with   Cough   URI    HPI    Discussed the use of AI scribe software for clinical note transcription with the patient, who gave verbal consent to proceed.  History of Present Illness Betty Cain is a 48 year old female  has a past medical history of Allergy , Anemia, Anxiety (01/2019), Asthma, B12 deficiency, Back pain, Common migraine with intractable migraine (06/28/2017), Constipation, Diabetes (HCC) (02/2019), Fatigue, Fibromyalgia, GERD (gastroesophageal reflux disease), Hypertension, IBS (irritable bowel syndrome) 2004-04-15), Joint pain, Neonatal death, Palpitations, Shortness of breath, Shortness of breath on exertion, Spinal headache, Swelling of both lower extremities, Valvular heart disease, and Vitamin D  deficiency (02/2019). who presents with generalized itching and chest pain.  She has been experiencing generalized itching all over her body for the past few days. Initially, she took some allergy  pills, but the symptoms persisted. She recalls a previous episode of itching caused by medication, but she is not currently using any new skincare products or soaps. She has been using a nasal spray prescribed by her doctor and takes levocetirizine in the evening, for which she needs a refill.  She reports chest pain that began two days ago. She has a history of chest pain and follows up with a cardiologist. The chest pain is described as chronic, and she has been seen by a cardiologist in the past.  In addition to the itching and chest pain, she experiences occasional sneezing and sometimes coughs. Interpretation services provided by medical staff. Assessment & Plan       Review of Systems  Constitutional:  Negative for appetite change, chills, fatigue and fever.  HENT:  Positive for sneezing. Negative for congestion, sinus pressure  and sinus pain.   Respiratory:  Positive for cough. Negative for shortness of breath and wheezing.   Cardiovascular:  Positive for chest pain. Negative for palpitations and leg swelling.       Chronic   Gastrointestinal:  Negative for abdominal pain, constipation, nausea and vomiting.  Genitourinary:  Negative for difficulty urinating, dysuria, flank pain and frequency.  Musculoskeletal:  Negative for arthralgias, back pain, joint swelling and myalgias.  Skin:  Negative for color change, pallor, rash and wound.  Neurological:  Negative for dizziness, facial asymmetry, weakness, numbness and headaches.  Psychiatric/Behavioral:  Negative for behavioral problems, confusion, self-injury and suicidal ideas.         Objective:    BP 106/69   Pulse 87   Temp (!) 97 F (36.1 C)   Wt 142 lb 9.6 oz (64.7 kg)   SpO2 100%   BMI 29.80 kg/m    Physical Exam Vitals and nursing note reviewed.  Constitutional:      General: She is not in acute distress.    Appearance: Normal appearance. She is not ill-appearing, toxic-appearing or diaphoretic.  HENT:     Right Ear: Tympanic membrane, ear canal and external ear normal. There is no impacted cerumen.     Left Ear: Tympanic membrane, ear canal and external ear normal. There is no impacted cerumen.     Nose: No congestion or rhinorrhea.     Mouth/Throat:     Mouth: Mucous membranes are moist.     Pharynx: Oropharynx is clear. No oropharyngeal exudate or posterior oropharyngeal erythema.  Eyes:     General: No scleral icterus.  Right eye: No discharge.        Left eye: No discharge.     Extraocular Movements: Extraocular movements intact.     Conjunctiva/sclera: Conjunctivae normal.  Cardiovascular:     Rate and Rhythm: Normal rate and regular rhythm.     Pulses: Normal pulses.     Heart sounds: Normal heart sounds. No murmur heard.    No friction rub. No gallop.  Pulmonary:     Effort: Pulmonary effort is normal. No respiratory  distress.     Breath sounds: Normal breath sounds. No stridor. No wheezing, rhonchi or rales.  Chest:     Chest wall: No tenderness.  Abdominal:     General: There is no distension.     Palpations: Abdomen is soft.     Tenderness: There is no abdominal tenderness. There is no right CVA tenderness, left CVA tenderness or guarding.  Musculoskeletal:        General: No swelling, tenderness, deformity or signs of injury.     Right lower leg: No edema.     Left lower leg: No edema.  Skin:    General: Skin is warm and dry.     Capillary Refill: Capillary refill takes less than 2 seconds.     Coloration: Skin is not jaundiced or pale.     Findings: No bruising or erythema.     Comments: Dry skin  Neurological:     Mental Status: She is alert and oriented to person, place, and time.     Motor: No weakness.     Coordination: Coordination normal.     Gait: Gait normal.  Psychiatric:        Mood and Affect: Mood normal.        Behavior: Behavior normal.        Thought Content: Thought content normal.        Judgment: Judgment normal.     No results found for any visits on 03/28/24.      Assessment & Plan:   Problem List Items Addressed This Visit       Respiratory   Allergic rhinitis - Primary   Flonase  and levocetirizine refilled      Relevant Medications   levocetirizine (XYZAL ) 5 MG tablet   fluticasone  (FLONASE ) 50 MCG/ACT nasal spray     Musculoskeletal and Integument   Dermatitis   Generalized pruritus and allergic rhinitis Generalized pruritus likely due to dryness or allergens. Allergic rhinitis with congestion and sneezing. Current treatment includes nasal spray and levocetirizine. - Refilled levocetirizine and Flonase  prescription. - Prescribed prednisone  for generalized itching, take with breakfast - Avoid hot shower/baths Advised warm water bathing and using moisturizer like Cetaphil. - Consider allergist or dermatologist referral if no improvement.        Relevant Medications   predniSONE  (DELTASONE ) 20 MG tablet     Other   Chronic chest pain   Chronic atypical chest pain Ongoing for two days, previously evaluated by cardiologist. Follow-up needed. - Advised contacting cardiologist for follow-up appointment.      Relevant Medications   predniSONE  (DELTASONE ) 20 MG tablet    Meds ordered this encounter  Medications   levocetirizine (XYZAL ) 5 MG tablet    Sig: Take 1 tablet (5 mg total) by mouth every evening.    Dispense:  90 tablet    Refill:  1   predniSONE  (DELTASONE ) 20 MG tablet    Sig: Take 1 tablet (20 mg total) by mouth daily with breakfast.  Dispense:  5 tablet    Refill:  0   fluticasone  (FLONASE ) 50 MCG/ACT nasal spray    Sig: Place 2 sprays into both nostrils daily.    Dispense:  48 g    Refill:  2    No follow-ups on file.  Spike Desilets R Rutledge Selsor, FNP

## 2024-03-28 NOTE — Patient Instructions (Addendum)
 Please avoid hot shower/bath to prevent skin dryness. Keep your skin well moisturized , I recommend cerave or Cetaphil moisturizer , you can get this at your local pharmacy     1. Allergic rhinitis, unspecified seasonality, unspecified trigger (Primary) - levocetirizine (XYZAL ) 5 MG tablet; Take 1 tablet (5 mg total) by mouth every evening.  Dispense: 90 tablet; Refill: 1  2. Dermatitis - predniSONE  (DELTASONE ) 20 MG tablet; Take 1 tablet (20 mg total) by mouth daily with breakfast.  Dispense: 5 tablet; Refill: 0    [phone]  cardiology office    It is important that you exercise regularly at least 30 minutes 5 times a week as tolerated  Think about what you will eat, plan ahead. Choose  clean, green, fresh or frozen over canned, processed or packaged foods which are more sugary, salty and fatty. 70 to 75% of food eaten should be vegetables and fruit. Three meals at set times with snacks allowed between meals, but they must be fruit or vegetables. Aim to eat over a 12 hour period , example 7 am to 7 pm, and STOP after  your last meal of the day. Drink water,generally about 64 ounces per day, no other drink is as healthy. Fruit juice is best enjoyed in a healthy way, by EATING the fruit.  Thanks for choosing [facility] we consider it a privelige to serve you.

## 2024-03-28 NOTE — Assessment & Plan Note (Signed)
 Flonase  and levocetirizine refilled

## 2024-03-28 NOTE — Assessment & Plan Note (Addendum)
 Generalized pruritus and allergic rhinitis Generalized pruritus likely due to dryness or allergens. Allergic rhinitis with congestion and sneezing. Current treatment includes nasal spray and levocetirizine. - Refilled levocetirizine and Flonase  prescription. - Prescribed prednisone  for generalized itching, take with breakfast - Avoid hot shower/baths Advised warm water bathing and using moisturizer like Cetaphil. - Consider allergist or dermatologist referral if no improvement.

## 2024-03-28 NOTE — Therapy (Signed)
 OUTPATIENT PHYSICAL THERAPY CERVICAL EVALUATION   Patient Name: Betty Cain MRN: [id number] DOB:1975/07/11, 48 y.o., female Today's Date: 03/28/2024  END OF SESSION:  PT End of Session - 03/28/24 1246     Visit Number 1    Number of Visits 13   with eval   Date for Recertification  05/11/24    Authorization Type [employer]    Authorization - Number of Visits 27    PT Start Time 0830    PT Stop Time 0915    PT Time Calculation (min) 45 min    Activity Tolerance Patient tolerated treatment well    Behavior During Therapy [facility] for tasks assessed/performed          Past Medical History:  Diagnosis Date   Allergy     Anemia    Anxiety 01/2019   Asthma    B12 deficiency    Back pain    Common migraine with intractable migraine 06/28/2017   Constipation    Diabetes (HCC) 02/2019   Fatigue    Fibromyalgia    GERD (gastroesophageal reflux disease)    pos H pylori   Hypertension    controlled with medication   IBS (irritable bowel syndrome) 2005   Joint pain    Neonatal death    Vaginal delivery, full term-lived x2 hours.    Palpitations    Shortness of breath    Shortness of breath on exertion    Spinal headache    Swelling of both lower extremities    Valvular heart disease    Vitamin D  deficiency 02/2019   Past Surgical History:  Procedure Laterality Date   ADENOIDECTOMY     and tonsils (as a child)   BIOPSY  08/27/2021   Procedure: BIOPSY;  Surgeon: Leigh Elspeth SQUIBB, MD;  Location: [facility];  Service: Gastroenterology;;   boil  2003   right elbow   CESAREAN SECTION     x4   CESAREAN SECTION  06/02/2011   Procedure: CESAREAN SECTION;  Surgeon: Norleen LULLA Server, MD;  Location: [facility];  Service: Gynecology;  Laterality: N/A;  Primary Cesarean Section Delivery Baby Boy @ 0004, Apgars 9/9   CESAREAN SECTION N/A 12/10/2013   Procedure: REPEAT CESAREAN SECTION;  Surgeon: Winton Felt, MD;  Location: [facility];  Service: Obstetrics;  Laterality: N/A;    CESAREAN SECTION     colonoscopy  11/23/2018   stated had issues with sleep when in for procedure   COLONOSCOPY N/A 12/19/2023   Procedure: COLONOSCOPY;  Surgeon: Leigh Elspeth SQUIBB, MD;  Location: [facility];  Service: Gastroenterology;  Laterality: N/A;  patient had difficulty breathing during anesthesia thought due to sleep apnea.   ESOPHAGOGASTRODUODENOSCOPY (EGD) WITH PROPOFOL  N/A 08/27/2021   Procedure: ESOPHAGOGASTRODUODENOSCOPY (EGD) WITH PROPOFOL ;  Surgeon: Leigh Elspeth SQUIBB, MD;  Location: [facility];  Service: Gastroenterology;  Laterality: N/A;   OTHER SURGICAL HISTORY  2005   Uterine surgery    uterine cauterization     Patient Active Problem List   Diagnosis Date Noted   History of colon polyps 12/19/2023   Dislocation of jaw 07/23/2022   Small vessel disease 06/29/2022   Type 2 diabetes mellitus with obesity 03/25/2022   Hypotension - iatrogenic 03/18/2022   Other chronic pain 02/02/2022   Dyspepsia    Early satiety    IDA (iron deficiency anemia) 01/29/2021   Paroxysmal supraventricular tachycardia 10/01/2020   Class 1 obesity with serious comorbidity and body mass index (BMI) of 31.0 to 31.9 in adult 10/01/2020  Diabetes mellitus (HCC) 02/14/2020   Mixed diabetic hyperlipidemia associated with type 2 diabetes mellitus (HCC) 01/10/2020   Hypertension associated with diabetes (HCC) 01/10/2020   Diabetic neuropathy (HCC) 01/10/2020   Vitamin D  deficiency 01/10/2020   Diabetes mellitus with proteinuria (HCC) 08/20/2019   History of Helicobacter pylori infection 08/08/2019   Proteinuria 06/14/2019   Elevated sed rate 05/23/2019   Elevated C-reactive protein (CRP) 05/23/2019   Diabetes mellitus without complication (HCC) 05/23/2019   Dyslipidemia (high LDL; low HDL) 05/23/2019   Type 2 diabetes mellitus with hyperglycemia, without long-term current use of insulin  (HCC) 02/17/2019   Hemoglobin A1C between 7% and 9% indicating borderline diabetic control (HCC)  02/17/2019   Breast cancer screening by mammogram 02/17/2019   Frequent headaches 02/14/2019   Anxiety 02/14/2019   Common migraine with intractable migraine 06/28/2017   Chest pain at rest 10/17/2016   Sinus tachycardia 08/18/2016   Prolonged Q-T interval on ECG 08/18/2016   Vertigo 07/22/2015   Abdominal discomfort 02/14/2015   Low back pain 02/14/2015   Abdominal pain 02/01/2015   Nausea without vomiting 02/01/2015   Environmental allergies 02/01/2015   Language barrier to communication 02/01/2015   Anemia 01/23/2015   Constipation 01/21/2015   Knee pain, bilateral 01/21/2015   Palpitations 09/18/2013   Shortness of breath 09/18/2013   Advanced maternal age in pregnancy in second trimester 06/19/2013   IBS (irritable bowel syndrome) 04/13/2003    PCP: Oley Bascom RAMAN, NP   REFERRING PROVIDER: Lorilee Sven SQUIBB, MD   REFERRING DIAG: M79.18 (ICD-10-CM) - Cervical myofascial pain syndrome   THERAPY DIAG:  Cervicalgia  Abnormal posture  Muscle weakness (generalized)  Radiculopathy, cervicothoracic region  Rationale for Evaluation and Treatment: Rehabilitation  ONSET DATE: chronic but recent  SUBJECTIVE:                                                                                                                                                                                                         SUBJECTIVE STATEMENT: I have been to [facility] clinic about 3 x. I have been doing exercises but I am not doing better. I improve when I do PT but then it comes back. I have a pain doctor and getting lidocaine  injections in the shoulder and he said I needed to go to PT for my neck and get stronger. I feel pain into my hand when I have to wash dishes.  Even when sweeping and doing household chores, I feel sweaty and in pain.  I feel fine when I do exercises but pain always comes  back. I even feel nauseated and weak Hand dominance: Right  PERTINENT HISTORY:  DMII,  fibromyalgia , IBS, migraine, HTN, obesity, tachycardia   PAIN:  Are you having pain? Yes: NPRS scale: sometimes 10/10 at worst and 8 most of the time Pain location: neck and bil shoulders especially Right side Pain description: very   sharp and pulling Aggravating factors: any time I use my arms, household chores Relieving factors: lidocaine  injections and rest  PRECAUTIONS: None  RED FLAGS: None     WEIGHT BEARING RESTRICTIONS: No  FALLS:  Has patient fallen in last 6 months? No  LIVING ENVIRONMENT: Lives with: lives with their family and lives with their spouse Lives in: House/apartment Stairs: no issues with stairs Has following equipment at home: None  OCCUPATION: housewife   PLOF: Independent  PATIENT GOALS: decrease pain  NEXT MD VISIT: n/a  OBJECTIVE:  Note: Objective measures were completed at Evaluation unless otherwise noted.  DIAGNOSTIC FINDINGS:  Thoracic spine x-ray 04/09/22 (-) R shoulder x-ray 04/09/22 (-) No new imaging of her neck seen on Epic  PATIENT SURVEYS:  NDI:  NECK DISABILITY INDEX  Date: 03-28-24 Score  Pain intensity 2 = The pain is moderate at the moment  2. Personal care (washing, dressing, etc.) 1 =  I can look after myself normally but it causes extra pain  3. Lifting 3 = Pain prevents me from lifting heavy weights but I can manage light to medium   weights if they are conveniently positioned  4. Reading 3 = I can't read as much as I want because of moderate pain in my neck  5. Headaches 4 = I have severe headaches, which come frequently   6. Concentration 2 = I have a fair degree of difficulty in concentrating when I want to  7. Work 3 =  I cannot do my usual work  8. Driving 3 = I can't drive my car as long as I want because of moderate pain in my neck  9. Sleeping 3 =  My sleep is moderately disturbed (2-3 hrs sleepless)  10. Recreation 3 = I am able to engage in a few of my usual recreation activities because of pain in   my  neck  Total 27/50   Minimum Detectable Change (90% confidence): 5 points or 10% points  COGNITION: Overall cognitive status: Within functional limits for tasks assessed  SENSATION: Pt feels numbness down to all of my right fingers  sometimes left  POSTURE: rounded shoulders and forward head  PALPATION: TTP over bil UT  r > L  tenderness over bil cervical paraspinals   CERVICAL ROM:   Active ROM A/PROM (deg) eval  Flexion 40 pain  Extension 40  Right lateral flexion 25 tight and pain  Left lateral flexion 35 tight and pain  Right rotation 35  Left rotation 30 pulls/pain on R   (Blank rows = not tested)  UPPER EXTREMITY ROM:All WNL but with pain rising above 90 flex and abd  Active ROM Right eval Left eval  Shoulder flexion    Shoulder extension    Shoulder abduction    Shoulder adduction    Shoulder extension    Shoulder internal rotation    Shoulder external rotation    Elbow flexion    Elbow extension    Wrist flexion    Wrist extension    Wrist ulnar deviation    Wrist radial deviation    Wrist pronation    Wrist supination     (  Blank rows = not tested)  UPPER EXTREMITY MMT:    MMT Right eval Left eval  Shoulder flexion 4+ 4+  Shoulder extension 3+ 3+  Shoulder abduction 4- 4  Shoulder adduction    Shoulder extension    Shoulder internal rotation 4- 4  Shoulder external rotation 4- 4  Middle trapezius    Lower trapezius    Elbow flexion    Elbow extension    Wrist flexion    Wrist extension    Wrist ulnar deviation    Wrist radial deviation    Wrist pronation    Wrist supination    Grip strength 32 lb 35lb   (Blank rows = not tested)  CERVICAL SPECIAL TESTS:  Neck flexor muscle endurance test: Positive, Upper limb tension test (ULTT): Positive, Spurling's test: Positive, and Distraction test: Positive  FUNCTIONAL TESTS:  5 times sit to stand: 9.58 sec  WNL DNFtest TREATMENT DATE: EValuation and HEP issued with following exericises  demo in session                                                                                                                              Shoulder External Rotation and Scapular Retraction with Resistance 2 x 10 Scapular Retraction with RTB  - 2 x daily - 7 x weekly - 3 sets - 10 reps - Shoulder Extension with Resistance - Palms Forward  - 1 x daily - 7 x weekly - 3 sets - 10 reps - Seated Scapular Retraction  - 1 x daily - 7 x weekly - 3 sets - 10 reps - Supine Cervical Retraction with Towel  - 1 x daily - 7 x weekly - 1 sets - 10 reps - Seated Levator Scapulae Stretch  - 1 x daily - 7 x weekly - 1 sets - 3 reps - 30 hold - Seated Cervical Sidebending Stretch  - 1 x daily - 7 x weekly - 1 sets - 3 reps - 30 hold - Doorway Pec Stretch at 90 Degrees Abduction  - 1 x daily - 7 x weekly - 3 sets - 10 reps   PATIENT EDUCATION:  Education details: POC, Explanation of findings, issue HEP Person educated: Patient  through interpreter Education method: Explanation, Demonstration, Tactile cues, Verbal cues, and Handouts Education comprehension: verbalized understanding, returned demonstration, tactile cues required, and needs further education  HOME EXERCISE PROGRAM: Access Code: [email or web address] URL: [email or web address] Date: 03/28/2024 Prepared by: Graydon Dingwall  Exercises - Shoulder External Rotation and Scapular Retraction with Resistance  - 2 x daily - 7 x weekly - 3 sets - 10 reps - Scapular Retraction with Resistance  - 2 x daily - 7 x weekly - 3 sets - 10 reps - Shoulder Extension with Resistance - Palms Forward  - 1 x daily - 7 x weekly - 3 sets - 10 reps - Seated Scapular Retraction  - 1 x daily - 7 x weekly - 3 sets -  10 reps - Supine Cervical Retraction with Towel  - 1 x daily - 7 x weekly - 1 sets - 10 reps - Seated Levator Scapulae Stretch  - 1 x daily - 7 x weekly - 1 sets - 3 reps - 30 hold - Seated Cervical Sidebending Stretch  - 1 x daily - 7 x weekly - 1 sets - 3  reps - 30 hold - Doorway Pec Stretch at 90 Degrees Abduction  - 1 x daily - 7 x weekly - 3 sets - 10 reps  ASSESSMENT:  CLINICAL IMPRESSION: Patient is a 48 y.o. female  who was seen today for physical therapy evaluation and treatment for neck pain with cervical radiculopathy and hx of fibromyalgia/migraines. Pt has muscle tightness and R> L UE weakness and tenderness with nerve muscle tension tests. DNF test can only hold 5 sec  ( normal at minimum 37 sec). Pt continues to have weak deep neck flexors and has had 2 previous visits to this clinic She reports pain relief in PT but has difficulty with carryover into her daily life. Pt will benefit from skilled PT to address issues and improve carryover to home and household chores. .   OBJECTIVE IMPAIRMENTS: decreased activity tolerance, decreased coordination, decreased endurance, decreased mobility, decreased ROM, decreased strength, dizziness, increased fascial restrictions, increased muscle spasms, impaired flexibility, impaired sensation, impaired UE functional use, postural dysfunction, obesity, and pain.   ACTIVITY LIMITATIONS: carrying, lifting, sleeping, bathing, toileting, dressing, reach over head, hygiene/grooming, and caring for others  PARTICIPATION LIMITATIONS: meal prep, cleaning, laundry, driving, shopping, and community activity  PERSONAL FACTORS: DMII, fibromyalgia , IBS, migraine, HTN, obesity, tachycardia  are also affecting patient's functional outcome.   REHAB POTENTIAL: Good  CLINICAL DECISION MAKING: Evolving/moderate complexity  EVALUATION COMPLEXITY: Moderate   GOALS: Goals reviewed with patient? Yes  SHORT TERM GOALS: Target date: 04-21-2024  Pt will be I with initial HEP Baseline: needs much reinforcement Goal status: INITIAL  2.  Pt will be able to maintain DNF test position for at least 10 sec Baseline: 5 sec Goal status: INITIAL    LONG TERM GOALS: Target date: 05-11-2024  Pt will be I with  management and compliance with HEP  Baseline: needs reinforcement with compliance Goal status: INITIAL  2.  Pt will be able to perform household chores  with 50% decreased pain/discomfort Baseline: at worst 10/10 Goal status: INITIAL  3.  Pt will demo R=L UE strength for lifting household items and carrying tasks at home Baseline:  R with decreased strength than L Goal status: INITIAL  4.  Pt will improve NDI to </= to 22/50  to demo MCID Baseline: 27/50 Goal status: INITIAL     PLAN:  PT FREQUENCY: 2x/week  PT DURATION: 6 weeks  PLANNED INTERVENTIONS: 97164- PT Re-evaluation, 97110-Therapeutic exercises, 97530- Therapeutic activity, 97112- Neuromuscular re-education, 97535- Self Care, 02859- Manual therapy, [id number]- Electrical stimulation (unattended), [phone]- Ionotophoresis 4mg /ml Dexamethasone , 79439 (1-2 muscles), 20561 (3+ muscles)- Dry Needling, Patient/Family education, Taping, Joint mobilization, Spinal mobilization, Cryotherapy, and Moist heat  PLAN FOR NEXT SESSION: Review HEP and progress,    For all possible CPT codes, reference the Planned Interventions line above.     Check all conditions that are expected to impact treatment: {Conditions expected to impact treatment:Diabetes mellitus and Musculoskeletal disorders   If treatment provided at initial evaluation, no treatment charged due to lack of authorization.       Graydon Dingwall, PT, [facility] Certified Exercise Expert for the Aging Adult  03/28/2024 1:09 PM Phone: [phone] Fax: [phone]

## 2024-03-29 ENCOUNTER — Other Ambulatory Visit (INDEPENDENT_AMBULATORY_CARE_PROVIDER_SITE_OTHER): Payer: Self-pay | Admitting: Adult Health

## 2024-03-29 ENCOUNTER — Other Ambulatory Visit: Payer: Self-pay | Admitting: Nurse Practitioner

## 2024-03-29 ENCOUNTER — Other Ambulatory Visit: Payer: Self-pay | Admitting: Physical Medicine and Rehabilitation

## 2024-03-29 DIAGNOSIS — Z0000 Encounter for general adult medical examination without abnormal findings: Secondary | ICD-10-CM

## 2024-03-29 DIAGNOSIS — E1140 Type 2 diabetes mellitus with diabetic neuropathy, unspecified: Secondary | ICD-10-CM

## 2024-03-30 NOTE — Telephone Encounter (Signed)
 Please advise [facility]

## 2024-04-03 ENCOUNTER — Ambulatory Visit (HOSPITAL_BASED_OUTPATIENT_CLINIC_OR_DEPARTMENT_OTHER)
Admission: RE | Admit: 2024-04-03 | Discharge: 2024-04-03 | Disposition: A | Discharge status: Home / Self Care | Source: Ambulatory Visit | Attending: Nurse Practitioner | Admitting: Nurse Practitioner

## 2024-04-03 ENCOUNTER — Encounter (HOSPITAL_BASED_OUTPATIENT_CLINIC_OR_DEPARTMENT_OTHER): Payer: Self-pay

## 2024-04-03 DIAGNOSIS — Z1231 Encounter for screening mammogram for malignant neoplasm of breast: Secondary | ICD-10-CM | POA: Insufficient documentation

## 2024-04-04 ENCOUNTER — Ambulatory Visit (INDEPENDENT_AMBULATORY_CARE_PROVIDER_SITE_OTHER): Admitting: Adult Health

## 2024-04-04 VITALS — BP 106/71 | HR 95 | Temp 98.2°F | Ht <= 58 in | Wt 138.0 lb

## 2024-04-04 DIAGNOSIS — Z6831 Body mass index (BMI) 31.0-31.9, adult: Secondary | ICD-10-CM

## 2024-04-04 DIAGNOSIS — R109 Unspecified abdominal pain: Secondary | ICD-10-CM | POA: Diagnosis not present

## 2024-04-04 DIAGNOSIS — Z7984 Long term (current) use of oral hypoglycemic drugs: Secondary | ICD-10-CM

## 2024-04-04 DIAGNOSIS — I10 Essential (primary) hypertension: Secondary | ICD-10-CM

## 2024-04-04 DIAGNOSIS — Z7985 Long-term (current) use of injectable non-insulin antidiabetic drugs: Secondary | ICD-10-CM | POA: Diagnosis not present

## 2024-04-04 DIAGNOSIS — Z6828 Body mass index (BMI) 28.0-28.9, adult: Secondary | ICD-10-CM

## 2024-04-04 DIAGNOSIS — E1159 Type 2 diabetes mellitus with other circulatory complications: Secondary | ICD-10-CM

## 2024-04-04 DIAGNOSIS — E1140 Type 2 diabetes mellitus with diabetic neuropathy, unspecified: Secondary | ICD-10-CM

## 2024-04-04 DIAGNOSIS — G8929 Other chronic pain: Secondary | ICD-10-CM | POA: Diagnosis not present

## 2024-04-04 DIAGNOSIS — E669 Obesity, unspecified: Secondary | ICD-10-CM | POA: Diagnosis not present

## 2024-04-04 DIAGNOSIS — I152 Hypertension secondary to endocrine disorders: Secondary | ICD-10-CM

## 2024-04-04 MED ORDER — FREESTYLE LIBRE 3 PLUS SENSOR MISC: 3 refills | Status: AC

## 2024-04-04 MED ORDER — OZEMPIC (0.25 OR 0.5 MG/DOSE) 2 MG/3ML ~~LOC~~ SOPN: 0.2500 mg | PEN_INJECTOR | SUBCUTANEOUS | 0 refills | Status: AC

## 2024-04-04 NOTE — Progress Notes (Signed)
 "    WEIGHT SUMMARY AND BIOMETRICS  Vitals Temp: 98.2 F (36.8 C) BP: 106/71 Pulse Rate: 95 SpO2: 99 %   Anthropometric Measurements Height: 4' 10 (1.473 m) Weight: 138 lb (62.6 kg) BMI (Calculated): 28.85 Weight at Last Visit: 139lb Weight Lost Since Last Visit: 1lb Weight Gained Since Last Visit: 0lb Starting Weight: 152lb Total Weight Loss (lbs): 14 lb (6.35 kg)   Body Composition  Body Fat %: 39.5 % Fat Mass (lbs): 54.6 lbs Muscle Mass (lbs): 79.6 lbs Total Body Water (lbs): 56.6 lbs Visceral Fat Rating : 8   Other Clinical Data Fasting: yes Labs: No Today's Visit #: 26 Starting Date: 01/10/20    Chief Complaint:   OBESITY Betty Cain is here to discuss her progress with her obesity treatment plan.  She is on the the Category 2 Plan and states she is following her eating plan approximately 50 % of the time.  She states she is exercising: NEAT Activities  Interim History:  She was restarted on weekly Ozempic  0.25mg  on/about 01/11/2024  She denies abdominal pain at present She reports being able to consume all prescribed food on plan In light of hx of chronic abd pain- recommend slow titration of GLP-1 therapy   Reviewed Bioimpedance Results with pt: Muscle Mass: +1.4 lbs Adipose Mass: -2.8 lbs  Of Note- [place] [facility] at [facility]  Subjective:   1. Other chronic pain PDMP Reviewed- no aberrancies notes 03/05/2024 OV at [facility]  2. Type 2 diabetes mellitus with diabetic neuropathy, without long-term current use of insulin  (HCC) She was restarted on weeklly Ozempic  0.25mg  on/about 01/11/2024  She denies abdominal pain at present She reports being able to consume all prescribed food on plan In light of hx of chronic abd pain- recommend slow titration of GLP-1 therapy  3. Hypertension associated with diabetes (HCC) BP excellent at OV She denies CP with exertion She denies tobacco/vape use She is on  aspirin EC 81 MG tablet  bisoprolol  (ZEBETA ) 5  MG tablet  metFORMIN  (GLUCOPHAGE -XR) 500 MG 24 hr tablet  rosuvastatin  (CRESTOR ) 5 MG tablet  Semaglutide ,0.25 or 0.5MG /DOS, (OZEMPIC , 0.25 OR 0.5 MG/DOSE,) 2 MG/3ML SOPN   4. Abdominal pain, unspecified abdominal location She denies abd pain at present In light of hx of chronic abd pain- recommend slow titration of GLP-1 therapy  Assessment/Plan:   1. Other chronic pain Continue healthy eating and increase activity as tolerated  2. Type 2 diabetes mellitus with diabetic neuropathy, without long-term current use of insulin  (HCC) (Primary) Refill  Semaglutide ,0.25 or 0.5MG /DOS, (OZEMPIC , 0.25 OR 0.5 MG/DOSE,) 2 MG/3ML SOPN Inject 0.25 mg into the skin once a week. Dispense: 9 mL, Refills: 0 ordered  Refill Continuous Glucose Sensor (FREESTYLE LIBRE 3 PLUS SENSOR) MISC Change sensor every 15 days. Dispense: 1 each, Refills: 3 ordered   3. Hypertension associated with diabetes (HCC) Continue healthy eating and increase daily activity Continue aspirin EC 81 MG tablet  bisoprolol  (ZEBETA ) 5 MG tablet  metFORMIN  (GLUCOPHAGE -XR) 500 MG 24 hr tablet  rosuvastatin  (CRESTOR ) 5 MG tablet  Semaglutide ,0.25 or 0.5MG /DOS, (OZEMPIC , 0.25 OR 0.5 MG/DOSE,) 2 MG/3ML SOPN   4. Abdominal pain, unspecified abdominal location Monitor for sx's Alert HWW and established GI was any concerns  5. Obesity, current BMI 28.9  Betty Cain is currently in the action stage of change. As such, her goal is to continue with weight loss efforts. She has agreed to the Category 2 Plan.   Exercise goals: All adults should avoid inactivity. Some physical  activity is better than none, and adults who participate in any amount of physical activity gain some health benefits. Adults should also include muscle-strengthening activities that involve all major muscle groups on 2 or more days a week. Walk Daily   Behavioral modification strategies: increasing lean protein intake, decreasing simple carbohydrates, increasing  vegetables, increasing water intake, decreasing liquid calories, no skipping meals, meal planning and cooking strategies, keeping healthy foods in the home, ways to avoid boredom eating, celebration eating strategies, and planning for success.  Khara has agreed to follow-up with our clinic in 4 weeks. She was informed of the importance of frequent follow-up visits to maximize her success with intensive lifestyle modifications for her multiple health conditions.   Objective:   Blood pressure 106/71, pulse 95, temperature 98.2 F (36.8 C), height 4' 10 (1.473 m), weight 138 lb (62.6 kg), SpO2 99%. Body mass index is 28.84 kg/m.  General: Cooperative, alert, well developed, in no acute distress. HEENT: Conjunctivae and lids unremarkable. Cardiovascular: Regular rhythm.  Lungs: Normal work of breathing. Neurologic: No focal deficits.   Lab Results  Component Value Date   CREATININE 0.76 01/20/2024   BUN 13 01/20/2024   NA 142 01/20/2024   K 4.5 01/20/2024   CL 105 01/20/2024   CO2 22 01/20/2024   Lab Results  Component Value Date   ALT 17 01/20/2024   AST 19 01/20/2024   ALKPHOS 89 01/20/2024   BILITOT 0.4 01/20/2024   Lab Results  Component Value Date   HGBA1C 5.5 01/20/2024   HGBA1C 5.5 01/20/2024   HGBA1C 5.7 (H) 08/24/2023   HGBA1C 5.4 07/21/2023   HGBA1C 5.3 05/12/2023   Lab Results  Component Value Date   INSULIN  6.2 08/24/2023   INSULIN  6.2 05/12/2023   INSULIN  6.5 03/07/2023   INSULIN  7.9 06/21/2022   INSULIN  3.8 12/28/2021   Lab Results  Component Value Date   TSH 2.230 01/20/2024   Lab Results  Component Value Date   CHOL 144 03/07/2023   HDL 42 03/07/2023   LDLCALC 89 03/07/2023   TRIG 66 03/07/2023   CHOLHDL 3.4 03/07/2023   Lab Results  Component Value Date   VD25OH 55.3 01/20/2024   VD25OH 35.3 08/24/2023   VD25OH 45.8 05/12/2023   Lab Results  Component Value Date   WBC 5.9 01/20/2024   HGB 12.8 01/20/2024   HCT 39.0 01/20/2024   MCV  91 01/20/2024   PLT 332 01/20/2024   Lab Results  Component Value Date   IRON 53 10/22/2022   TIBC 253 10/22/2022   FERRITIN 295 (H) 10/22/2022   Attestation Statements:   Reviewed by clinician on day of visit: allergies, medications, problem list, medical history, surgical history, family history, social history, and previous encounter notes.  I have reviewed the above documentation for accuracy and completeness, and I agree with the above. -  Anyjah Roundtree d. Pharell Rolfson, NP-C "

## 2024-04-11 ENCOUNTER — Ambulatory Visit

## 2024-04-11 DIAGNOSIS — M6281 Muscle weakness (generalized): Secondary | ICD-10-CM

## 2024-04-11 DIAGNOSIS — M542 Cervicalgia: Secondary | ICD-10-CM

## 2024-04-11 DIAGNOSIS — M5413 Radiculopathy, cervicothoracic region: Secondary | ICD-10-CM

## 2024-04-11 DIAGNOSIS — R293 Abnormal posture: Secondary | ICD-10-CM

## 2024-04-11 NOTE — Therapy (Signed)
 " OUTPATIENT PHYSICAL THERAPY CERVICAL TREATMENT   Patient Name: Betty Cain MRN: [id number] DOB:[birth year], 48 y.o., female Today's Date: 04/11/2024  END OF SESSION:  PT End of Session - 04/11/24 1042     Visit Number 2    Number of Visits 13    Date for Recertification  05/11/24    Authorization Type [employer]    Authorization Time Period Approved 6 PT visits from 03/1724-05/26/24    Progress Note Due on Visit 6    PT Start Time 1045    PT Stop Time 1125    PT Time Calculation (min) 40 min    Activity Tolerance Patient tolerated treatment well    Behavior During Therapy [facility] for tasks assessed/performed           Past Medical History:  Diagnosis Date   Allergy     Anemia    Anxiety 01/2019   Asthma    B12 deficiency    Back pain    Common migraine with intractable migraine 06/28/2017   Constipation    Diabetes (HCC) 02/2019   Fatigue    Fibromyalgia    GERD (gastroesophageal reflux disease)    pos H pylori   Hypertension    controlled with medication   IBS (irritable bowel syndrome) 2005   Joint pain    Neonatal death    Vaginal delivery, full term-lived x2 hours.    Palpitations    Shortness of breath    Shortness of breath on exertion    Spinal headache    Swelling of both lower extremities    Valvular heart disease    Vitamin D  deficiency 02/2019   Past Surgical History:  Procedure Laterality Date   ADENOIDECTOMY     and tonsils (as a child)   BIOPSY  08/27/2021   Procedure: BIOPSY;  Surgeon: Leigh Elspeth SQUIBB, MD;  Location: [facility];  Service: Gastroenterology;;   boil  2003   right elbow   CESAREAN SECTION     x4   CESAREAN SECTION  06/02/2011   Procedure: CESAREAN SECTION;  Surgeon: Norleen LULLA Server, MD;  Location: [facility];  Service: Gynecology;  Laterality: N/A;  Primary Cesarean Section Delivery Baby Boy @ 0004, Apgars 9/9   CESAREAN SECTION N/A 12/10/2013   Procedure: REPEAT CESAREAN SECTION;  Surgeon: Winton Felt, MD;   Location: [facility];  Service: Obstetrics;  Laterality: N/A;   CESAREAN SECTION     colonoscopy  11/23/2018   stated had issues with sleep when in for procedure   COLONOSCOPY N/A 12/19/2023   Procedure: COLONOSCOPY;  Surgeon: Leigh Elspeth SQUIBB, MD;  Location: [facility];  Service: Gastroenterology;  Laterality: N/A;  patient had difficulty breathing during anesthesia thought due to sleep apnea.   ESOPHAGOGASTRODUODENOSCOPY (EGD) WITH PROPOFOL  N/A 08/27/2021   Procedure: ESOPHAGOGASTRODUODENOSCOPY (EGD) WITH PROPOFOL ;  Surgeon: Leigh Elspeth SQUIBB, MD;  Location: [facility];  Service: Gastroenterology;  Laterality: N/A;   OTHER SURGICAL HISTORY  2005   Uterine surgery    uterine cauterization     Patient Active Problem List   Diagnosis Date Noted   Allergic rhinitis 03/28/2024   Dermatitis 03/28/2024   Chronic chest pain 03/28/2024   History of colon polyps 12/19/2023   Dislocation of jaw 07/23/2022   Small vessel disease 06/29/2022   Type 2 diabetes mellitus with obesity 03/25/2022   Hypotension - iatrogenic 03/18/2022   Other chronic pain 02/02/2022   Dyspepsia    Early satiety    IDA (iron deficiency anemia) 01/29/2021  Paroxysmal supraventricular tachycardia 10/01/2020   Class 1 obesity with serious comorbidity and body mass index (BMI) of 31.0 to 31.9 in adult 10/01/2020   Diabetes mellitus (HCC) 02/14/2020   Mixed diabetic hyperlipidemia associated with type 2 diabetes mellitus (HCC) 01/10/2020   Hypertension associated with diabetes (HCC) 01/10/2020   Diabetic neuropathy (HCC) 01/10/2020   Vitamin D  deficiency 01/10/2020   Diabetes mellitus with proteinuria (HCC) 08/20/2019   History of Helicobacter pylori infection 08/08/2019   Proteinuria 06/14/2019   Elevated sed rate 05/23/2019   Elevated C-reactive protein (CRP) 05/23/2019   Diabetes mellitus without complication (HCC) 05/23/2019   Dyslipidemia (high LDL; low HDL) 05/23/2019   Type 2 diabetes mellitus with  hyperglycemia, without long-term current use of insulin  (HCC) 02/17/2019   Hemoglobin A1C between 7% and 9% indicating borderline diabetic control (HCC) 02/17/2019   Breast cancer screening by mammogram 02/17/2019   Frequent headaches 02/14/2019   Anxiety 02/14/2019   Common migraine with intractable migraine 06/28/2017   Chest pain at rest 10/17/2016   Sinus tachycardia 08/18/2016   Prolonged Q-T interval on ECG 08/18/2016   Vertigo 07/22/2015   Abdominal discomfort 02/14/2015   Low back pain 02/14/2015   Abdominal pain 02/01/2015   Nausea without vomiting 02/01/2015   Environmental allergies 02/01/2015   Language barrier to communication 02/01/2015   Anemia 01/23/2015   Constipation 01/21/2015   Knee pain, bilateral 01/21/2015   Palpitations 09/18/2013   Shortness of breath 09/18/2013   Advanced maternal age in pregnancy in second trimester 06/19/2013   IBS (irritable bowel syndrome) 04/13/2003    PCP: Oley Bascom RAMAN, NP   REFERRING PROVIDER: Lorilee Sven SQUIBB, MD   REFERRING DIAG: M79.18 (ICD-10-CM) - Cervical myofascial pain syndrome   THERAPY DIAG:  Cervicalgia  Abnormal posture  Muscle weakness (generalized)  Radiculopathy, cervicothoracic region  Rationale for Evaluation and Treatment: Rehabilitation  ONSET DATE: chronic but recent  SUBJECTIVE:                                                                                                                                                                                                         SUBJECTIVE STATEMENT: 04/11/2024 Accompanied by an interpreter. Pt reports the HEP is going well. She continues to have pain when mixing food and vacuuming.   EVAL I have been to [facility] about 3 x. I have been doing exercises but I am not doing better. I improve when I do PT but then it comes back. I have a pain doctor and getting lidocaine  injections in the shoulder and he  said I needed to go to PT for my  neck and get stronger. I feel pain into my hand when I have to wash dishes.  Even when sweeping and doing household chores, I feel sweaty and in pain.  I feel fine when I do exercises but pain always comes back. I even feel nauseated and weak Hand dominance: Right  PERTINENT HISTORY:  DMII, fibromyalgia , IBS, migraine, HTN, obesity, tachycardia   PAIN:  Are you having pain? Yes: NPRS scale: sometimes 10/10 at worst and 8 most of the time Pain location: neck and bil shoulders especially Right side Pain description: very   sharp and pulling Aggravating factors: any time I use my arms, household chores Relieving factors: lidocaine  injections and rest  PRECAUTIONS: None  RED FLAGS: None     WEIGHT BEARING RESTRICTIONS: No  FALLS:  Has patient fallen in last 6 months? No  LIVING ENVIRONMENT: Lives with: lives with their family and lives with their spouse Lives in: House/apartment Stairs: no issues with stairs Has following equipment at home: None  OCCUPATION: housewife   PLOF: Independent  PATIENT GOALS: decrease pain  NEXT MD VISIT: n/a  OBJECTIVE:  Note: Objective measures were completed at Evaluation unless otherwise noted.  DIAGNOSTIC FINDINGS:  Thoracic spine x-ray 04/09/22 (-) R shoulder x-ray 04/09/22 (-) No new imaging of her neck seen on Epic  PATIENT SURVEYS:  NDI:  NECK DISABILITY INDEX  Date: 03-28-24 Score  Pain intensity 2 = The pain is moderate at the moment  2. Personal care (washing, dressing, etc.) 1 =  I can look after myself normally but it causes extra pain  3. Lifting 3 = Pain prevents me from lifting heavy weights but I can manage light to medium   weights if they are conveniently positioned  4. Reading 3 = I can't read as much as I want because of moderate pain in my neck  5. Headaches 4 = I have severe headaches, which come frequently   6. Concentration 2 = I have a fair degree of difficulty in concentrating when I want to  7. Work 3 =  I  cannot do my usual work  8. Driving 3 = I can't drive my car as long as I want because of moderate pain in my neck  9. Sleeping 3 =  My sleep is moderately disturbed (2-3 hrs sleepless)  10. Recreation 3 = I am able to engage in a few of my usual recreation activities because of pain in   my neck  Total 27/50   Minimum Detectable Change (90% confidence): 5 points or 10% points  COGNITION: Overall cognitive status: Within functional limits for tasks assessed  SENSATION: Pt feels numbness down to all of my right fingers  sometimes left  POSTURE: rounded shoulders and forward head  PALPATION: TTP over bil UT  r > L  tenderness over bil cervical paraspinals   CERVICAL ROM:   Active ROM A/PROM (deg) eval  Flexion 40 pain  Extension 40  Right lateral flexion 25 tight and pain  Left lateral flexion 35 tight and pain  Right rotation 35  Left rotation 30 pulls/pain on R   (Blank rows = not tested)  UPPER EXTREMITY ROM:All WNL but with pain rising above 90 flex and abd  Active ROM Right eval Left eval  Shoulder flexion    Shoulder extension    Shoulder abduction    Shoulder adduction    Shoulder extension    Shoulder internal rotation  Shoulder external rotation    Elbow flexion    Elbow extension    Wrist flexion    Wrist extension    Wrist ulnar deviation    Wrist radial deviation    Wrist pronation    Wrist supination     (Blank rows = not tested)  UPPER EXTREMITY MMT:    MMT Right eval Left eval  Shoulder flexion 4+ 4+  Shoulder extension 3+ 3+  Shoulder abduction 4- 4  Shoulder adduction    Shoulder extension    Shoulder internal rotation 4- 4  Shoulder external rotation 4- 4  Middle trapezius    Lower trapezius    Elbow flexion    Elbow extension    Wrist flexion    Wrist extension    Wrist ulnar deviation    Wrist radial deviation    Wrist pronation    Wrist supination    Grip strength 32 lb 35lb   (Blank rows = not tested)  CERVICAL  SPECIAL TESTS:  Neck flexor muscle endurance test: Positive, Upper limb tension test (ULTT): Positive, Spurling's test: Positive, and Distraction test: Positive  FUNCTIONAL TESTS:  5 times sit to stand: 9.58 sec  WNL DNFtest TREATMENT DATE:  [facility] Adult PT Treatment:                                                DATE: 04/11/2024   Manual:  Active release B upper traps   Therapeutic exercise:  Isometric cervical in all directions 10x5 hold Supine horizontal ABD YTB x 10, RTB x 10  Serratus wall slide with foam roller  Serratus wall slide YTB x 10, not all wall x 10  Discussion of using a stool to mix food when it sits on the stove   EValuation and HEP issued with following exericises demo in session                                                                                                                              Shoulder External Rotation and Scapular Retraction with Resistance 2 x 10 Scapular Retraction with RTB  - 2 x daily - 7 x weekly - 3 sets - 10 reps - Shoulder Extension with Resistance - Palms Forward  - 1 x daily - 7 x weekly - 3 sets - 10 reps - Seated Scapular Retraction  - 1 x daily - 7 x weekly - 3 sets - 10 reps - Supine Cervical Retraction with Towel  - 1 x daily - 7 x weekly - 1 sets - 10 reps - Seated Levator Scapulae Stretch  - 1 x daily - 7 x weekly - 1 sets - 3 reps - 30 hold - Seated Cervical Sidebending Stretch  - 1 x daily - 7 x weekly - 1 sets - 3 reps -  30 hold - Doorway Pec Stretch at 90 Degrees Abduction  - 1 x daily - 7 x weekly - 3 sets - 10 reps   PATIENT EDUCATION:  Education details: POC, Explanation of findings, issue HEP Person educated: Patient  through interpreter Education method: Explanation, Demonstration, Tactile cues, Verbal cues, and Handouts Education comprehension: verbalized understanding, returned demonstration, tactile cues required, and needs further education  HOME EXERCISE PROGRAM: Access Code: [email or web address] URL:  [email or web address] Date: 03/28/2024 Prepared by: Graydon Dingwall  Exercises - Shoulder External Rotation and Scapular Retraction with Resistance  - 2 x daily - 7 x weekly - 3 sets - 10 reps - Scapular Retraction with Resistance  - 2 x daily - 7 x weekly - 3 sets - 10 reps - Shoulder Extension with Resistance - Palms Forward  - 1 x daily - 7 x weekly - 3 sets - 10 reps - Seated Scapular Retraction  - 1 x daily - 7 x weekly - 3 sets - 10 reps - Supine Cervical Retraction with Towel  - 1 x daily - 7 x weekly - 1 sets - 10 reps - Seated Levator Scapulae Stretch  - 1 x daily - 7 x weekly - 1 sets - 3 reps - 30 hold - Seated Cervical Sidebending Stretch  - 1 x daily - 7 x weekly - 1 sets - 3 reps - 30 hold - Doorway Pec Stretch at 90 Degrees Abduction  - 1 x daily - 7 x weekly - 3 sets - 10 reps  ASSESSMENT:  CLINICAL IMPRESSION: 04/11/2024 Pt introduced to cervical and scapular stabilization with no increase in pain. Pt under using low trap, mid trap, and serratus to stabilize scapulas. She had some fatigue by the end of session. The pt will benefit from skilled physical therapy to decrease pain and increase function.   EVAL Patient is a 48 y.o. female  who was seen today for physical therapy evaluation and treatment for neck pain with cervical radiculopathy and hx of fibromyalgia/migraines. Pt has muscle tightness and R> L UE weakness and tenderness with nerve muscle tension tests. DNF test can only hold 5 sec  ( normal at minimum 37 sec). Pt continues to have weak deep neck flexors and has had 2 previous visits to this clinic She reports pain relief in PT but has difficulty with carryover into her daily life. Pt will benefit from skilled PT to address issues and improve carryover to home and household chores. .   OBJECTIVE IMPAIRMENTS: decreased activity tolerance, decreased coordination, decreased endurance, decreased mobility, decreased ROM, decreased strength, dizziness,  increased fascial restrictions, increased muscle spasms, impaired flexibility, impaired sensation, impaired UE functional use, postural dysfunction, obesity, and pain.   ACTIVITY LIMITATIONS: carrying, lifting, sleeping, bathing, toileting, dressing, reach over head, hygiene/grooming, and caring for others  PARTICIPATION LIMITATIONS: meal prep, cleaning, laundry, driving, shopping, and community activity  PERSONAL FACTORS: DMII, fibromyalgia , IBS, migraine, HTN, obesity, tachycardia  are also affecting patient's functional outcome.   REHAB POTENTIAL: Good  CLINICAL DECISION MAKING: Evolving/moderate complexity  EVALUATION COMPLEXITY: Moderate   GOALS: Goals reviewed with patient? Yes  SHORT TERM GOALS: Target date: 04-21-2024  Pt will be I with initial HEP Baseline: needs much reinforcement Goal status: INITIAL  2.  Pt will be able to maintain DNF test position for at least 10 sec Baseline: 5 sec Goal status: INITIAL    LONG TERM GOALS: Target date: 05-11-2024  Pt will be I with management and compliance with HEP  Baseline:  needs reinforcement with compliance Goal status: INITIAL  2.  Pt will be able to perform household chores  with 50% decreased pain/discomfort Baseline: at worst 10/10 Goal status: INITIAL  3.  Pt will demo R=L UE strength for lifting household items and carrying tasks at home Baseline:  R with decreased strength than L Goal status: INITIAL  4.  Pt will improve NDI to </= to 22/50  to demo MCID Baseline: 27/50 Goal status: INITIAL     PLAN:  PT FREQUENCY: 2x/week  PT DURATION: 6 weeks  PLANNED INTERVENTIONS: 97164- PT Re-evaluation, 97110-Therapeutic exercises, 97530- Therapeutic activity, 97112- Neuromuscular re-education, 97535- Self Care, [id number]- Manual therapy, [id number]- Electrical stimulation (unattended), [phone]- Ionotophoresis 4mg /ml Dexamethasone , 79439 (1-2 muscles), 20561 (3+ muscles)- Dry Needling, Patient/Family education, Taping,  Joint mobilization, Spinal mobilization, Cryotherapy, and Moist heat  PLAN FOR NEXT SESSION: Review HEP and progress,    For all possible CPT codes, reference the Planned Interventions line above.     Check all conditions that are expected to impact treatment: {Conditions expected to impact treatment:Diabetes mellitus and Musculoskeletal disorders   If treatment provided at initial evaluation, no treatment charged due to lack of authorization.       Marijo Berber PT, DPT 04/11/2024 1:31 PM    "

## 2024-04-17 ENCOUNTER — Ambulatory Visit: Attending: Physical Medicine and Rehabilitation

## 2024-04-17 DIAGNOSIS — R293 Abnormal posture: Secondary | ICD-10-CM | POA: Diagnosis present

## 2024-04-17 DIAGNOSIS — M5413 Radiculopathy, cervicothoracic region: Secondary | ICD-10-CM | POA: Insufficient documentation

## 2024-04-17 DIAGNOSIS — M6281 Muscle weakness (generalized): Secondary | ICD-10-CM | POA: Diagnosis present

## 2024-04-17 DIAGNOSIS — M542 Cervicalgia: Secondary | ICD-10-CM | POA: Insufficient documentation

## 2024-04-17 NOTE — Therapy (Signed)
 " OUTPATIENT PHYSICAL THERAPY CERVICAL TREATMENT   Patient Name: Betty Cain MRN: [id number] DOB:1975-10-22, 49 y.o., female Today's Date: 04/17/2024  END OF SESSION:  PT End of Session - 04/17/24 1043     Visit Number 3    Number of Visits 13    Date for Recertification  05/11/24    Authorization Type [employer]    Authorization Time Period Approved 6 PT visits from 03/28/24-05/26/24    Authorization - Visit Number 2    Authorization - Number of Visits 6    Progress Note Due on Visit 6    PT Start Time 1045    PT Stop Time 1125    PT Time Calculation (min) 40 min    Activity Tolerance Patient tolerated treatment well;Patient limited by pain    Behavior During Therapy [facility] for tasks assessed/performed           Past Medical History:  Diagnosis Date   Allergy     Anemia    Anxiety 01/2019   Asthma    B12 deficiency    Back pain    Common migraine with intractable migraine 06/28/2017   Constipation    Diabetes (HCC) 02/2019   Fatigue    Fibromyalgia    GERD (gastroesophageal reflux disease)    pos H pylori   Hypertension    controlled with medication   IBS (irritable bowel syndrome) 2005   Joint pain    Neonatal death    Vaginal delivery, full term-lived x2 hours.    Palpitations    Shortness of breath    Shortness of breath on exertion    Spinal headache    Swelling of both lower extremities    Valvular heart disease    Vitamin D  deficiency 02/2019   Past Surgical History:  Procedure Laterality Date   ADENOIDECTOMY     and tonsils (as a child)   BIOPSY  08/27/2021   Procedure: BIOPSY;  Surgeon: Leigh Elspeth SQUIBB, MD;  Location: [facility];  Service: Gastroenterology;;   boil  2003   right elbow   CESAREAN SECTION     x4   CESAREAN SECTION  06/02/2011   Procedure: CESAREAN SECTION;  Surgeon: Norleen LULLA Server, MD;  Location: [facility];  Service: Gynecology;  Laterality: N/A;  Primary Cesarean Section Delivery Baby Boy @ 0004, Apgars 9/9   CESAREAN  SECTION N/A 12/10/2013   Procedure: REPEAT CESAREAN SECTION;  Surgeon: Winton Felt, MD;  Location: [facility];  Service: Obstetrics;  Laterality: N/A;   CESAREAN SECTION     colonoscopy  11/23/2018   stated had issues with sleep when in for procedure   COLONOSCOPY N/A 12/19/2023   Procedure: COLONOSCOPY;  Surgeon: Leigh Elspeth SQUIBB, MD;  Location: [facility];  Service: Gastroenterology;  Laterality: N/A;  patient had difficulty breathing during anesthesia thought due to sleep apnea.   ESOPHAGOGASTRODUODENOSCOPY (EGD) WITH PROPOFOL  N/A 08/27/2021   Procedure: ESOPHAGOGASTRODUODENOSCOPY (EGD) WITH PROPOFOL ;  Surgeon: Leigh Elspeth SQUIBB, MD;  Location: [facility];  Service: Gastroenterology;  Laterality: N/A;   OTHER SURGICAL HISTORY  2005   Uterine surgery    uterine cauterization     Patient Active Problem List   Diagnosis Date Noted   Allergic rhinitis 03/28/2024   Dermatitis 03/28/2024   Chronic chest pain 03/28/2024   History of colon polyps 12/19/2023   Dislocation of jaw 07/23/2022   Small vessel disease 06/29/2022   Type 2 diabetes mellitus with obesity 03/25/2022   Hypotension - iatrogenic 03/18/2022  Other chronic pain 02/02/2022   Dyspepsia    Early satiety    IDA (iron deficiency anemia) 01/29/2021   Paroxysmal supraventricular tachycardia 10/01/2020   Class 1 obesity with serious comorbidity and body mass index (BMI) of 31.0 to 31.9 in adult 10/01/2020   Diabetes mellitus (HCC) 02/14/2020   Mixed diabetic hyperlipidemia associated with type 2 diabetes mellitus (HCC) 01/10/2020   Hypertension associated with diabetes (HCC) 01/10/2020   Diabetic neuropathy (HCC) 01/10/2020   Vitamin D  deficiency 01/10/2020   Diabetes mellitus with proteinuria (HCC) 08/20/2019   History of Helicobacter pylori infection 08/08/2019   Proteinuria 06/14/2019   Elevated sed rate 05/23/2019   Elevated C-reactive protein (CRP) 05/23/2019   Diabetes mellitus without complication (HCC)  05/23/2019   Dyslipidemia (high LDL; low HDL) 05/23/2019   Type 2 diabetes mellitus with hyperglycemia, without long-term current use of insulin  (HCC) 02/17/2019   Hemoglobin A1C between 7% and 9% indicating borderline diabetic control (HCC) 02/17/2019   Breast cancer screening by mammogram 02/17/2019   Frequent headaches 02/14/2019   Anxiety 02/14/2019   Common migraine with intractable migraine 06/28/2017   Chest pain at rest 10/17/2016   Sinus tachycardia 08/18/2016   Prolonged Q-T interval on ECG 08/18/2016   Vertigo 07/22/2015   Abdominal discomfort 02/14/2015   Low back pain 02/14/2015   Abdominal pain 02/01/2015   Nausea without vomiting 02/01/2015   Environmental allergies 02/01/2015   Language barrier to communication 02/01/2015   Anemia 01/23/2015   Constipation 01/21/2015   Knee pain, bilateral 01/21/2015   Palpitations 09/18/2013   Shortness of breath 09/18/2013   Advanced maternal age in pregnancy in second trimester 06/19/2013   IBS (irritable bowel syndrome) 04/13/2003    PCP: Oley Bascom RAMAN, NP   REFERRING PROVIDER: Lorilee Sven SQUIBB, MD   REFERRING DIAG: M79.18 (ICD-10-CM) - Cervical myofascial pain syndrome   THERAPY DIAG:  Cervicalgia  Abnormal posture  Muscle weakness (generalized)  Radiculopathy, cervicothoracic region  Rationale for Evaluation and Treatment: Rehabilitation  ONSET DATE: chronic but recent  SUBJECTIVE:                                                                                                                                                                                                         SUBJECTIVE STATEMENT: 04/17/2024 Utilized video interpreter today: Patient reports that she is doing bad, has a hard time with home exercises and has a lot of pain.  EVAL I have been to [facility] about 3 x. I have been doing exercises but I am not doing better. I  improve when I do PT but then it comes back. I have a pain  doctor and getting lidocaine  injections in the shoulder and he said I needed to go to PT for my neck and get stronger. I feel pain into my hand when I have to wash dishes.  Even when sweeping and doing household chores, I feel sweaty and in pain.  I feel fine when I do exercises but pain always comes back. I even feel nauseated and weak Hand dominance: Right  PERTINENT HISTORY:  DMII, fibromyalgia , IBS, migraine, HTN, obesity, tachycardia   PAIN:  Are you having pain? Yes: NPRS scale: sometimes 10/10 at worst and 8 most of the time Pain location: neck and bil shoulders especially Right side Pain description: very   sharp and pulling Aggravating factors: any time I use my arms, household chores Relieving factors: lidocaine  injections and rest  PRECAUTIONS: None  RED FLAGS: None     WEIGHT BEARING RESTRICTIONS: No  FALLS:  Has patient fallen in last 6 months? No  LIVING ENVIRONMENT: Lives with: lives with their family and lives with their spouse Lives in: House/apartment Stairs: no issues with stairs Has following equipment at home: None  OCCUPATION: housewife   PLOF: Independent  PATIENT GOALS: decrease pain  NEXT MD VISIT: n/a  OBJECTIVE:  Note: Objective measures were completed at Evaluation unless otherwise noted.  DIAGNOSTIC FINDINGS:  Thoracic spine x-ray 04/09/22 (-) R shoulder x-ray 04/09/22 (-) No new imaging of her neck seen on Epic  PATIENT SURVEYS:  NDI:  NECK DISABILITY INDEX  Date: 03-28-24 Score  Pain intensity 2 = The pain is moderate at the moment  2. Personal care (washing, dressing, etc.) 1 =  I can look after myself normally but it causes extra pain  3. Lifting 3 = Pain prevents me from lifting heavy weights but I can manage light to medium   weights if they are conveniently positioned  4. Reading 3 = I can't read as much as I want because of moderate pain in my neck  5. Headaches 4 = I have severe headaches, which come frequently   6.  Concentration 2 = I have a fair degree of difficulty in concentrating when I want to  7. Work 3 =  I cannot do my usual work  8. Driving 3 = I can't drive my car as long as I want because of moderate pain in my neck  9. Sleeping 3 =  My sleep is moderately disturbed (2-3 hrs sleepless)  10. Recreation 3 = I am able to engage in a few of my usual recreation activities because of pain in   my neck  Total 27/50   Minimum Detectable Change (90% confidence): 5 points or 10% points  COGNITION: Overall cognitive status: Within functional limits for tasks assessed  SENSATION: Pt feels numbness down to all of my right fingers  sometimes left  POSTURE: rounded shoulders and forward head  PALPATION: TTP over bil UT  r > L  tenderness over bil cervical paraspinals   CERVICAL ROM:   Active ROM A/PROM (deg) eval  Flexion 40 pain  Extension 40  Right lateral flexion 25 tight and pain  Left lateral flexion 35 tight and pain  Right rotation 35  Left rotation 30 pulls/pain on R   (Blank rows = not tested)  UPPER EXTREMITY ROM:All WNL but with pain rising above 90 flex and abd  Active ROM Right eval Left eval  Shoulder flexion  Shoulder extension    Shoulder abduction    Shoulder adduction    Shoulder extension    Shoulder internal rotation    Shoulder external rotation    Elbow flexion    Elbow extension    Wrist flexion    Wrist extension    Wrist ulnar deviation    Wrist radial deviation    Wrist pronation    Wrist supination     (Blank rows = not tested)  UPPER EXTREMITY MMT:    MMT Right eval Left eval  Shoulder flexion 4+ 4+  Shoulder extension 3+ 3+  Shoulder abduction 4- 4  Shoulder adduction    Shoulder extension    Shoulder internal rotation 4- 4  Shoulder external rotation 4- 4  Middle trapezius    Lower trapezius    Elbow flexion    Elbow extension    Wrist flexion    Wrist extension    Wrist ulnar deviation    Wrist radial deviation    Wrist  pronation    Wrist supination    Grip strength 32 lb 35lb   (Blank rows = not tested)  CERVICAL SPECIAL TESTS:  Neck flexor muscle endurance test: Positive, Upper limb tension test (ULTT): Positive, Spurling's test: Positive, and Distraction test: Positive  FUNCTIONAL TESTS:  5 times sit to stand: 9.58 sec  WNL DNFtest TREATMENT DATE:  [facility] Adult PT Treatment:                                                DATE: 04/17/24 Therapeutic Exercise: UBE level 1 3'/3' fwd/bwd while gathering subjective and planning session with patient Seated cervical ext over towel 10 hold x10 Neuromuscular re-ed: Isometric cervical in all directions 10x5 hold Supine horizontal ABD YTB 2x10 Supine diagonal YTB x10 BIL  Seated serratus wall slide YTB not all wall 2 x 10  Rows YTB 2x10 Shoulder extension YTB 2x10 Manual:  Active release B upper traps, pt sitting in chair  [facility] Adult PT Treatment:                                                DATE: 04/11/24  Manual:  Active release B upper traps   Therapeutic exercise:  Isometric cervical in all directions 10x5 hold Supine horizontal ABD YTB x 10, RTB x 10  Serratus wall slide with foam roller  Serratus wall slide YTB x 10, not all wall x 10  Discussion of using a stool to mix food when it sits on the stove   EValuation and HEP issued with following exericises demo in session  Shoulder External Rotation and Scapular Retraction with Resistance 2 x 10 Scapular Retraction with RTB  - 2 x daily - 7 x weekly - 3 sets - 10 reps - Shoulder Extension with Resistance - Palms Forward  - 1 x daily - 7 x weekly - 3 sets - 10 reps - Seated Scapular Retraction  - 1 x daily - 7 x weekly - 3 sets - 10 reps - Supine Cervical Retraction with Towel  - 1 x daily - 7 x weekly - 1 sets - 10 reps - Seated Levator Scapulae Stretch  - 1 x daily - 7 x  weekly - 1 sets - 3 reps - 30 hold - Seated Cervical Sidebending Stretch  - 1 x daily - 7 x weekly - 1 sets - 3 reps - 30 hold - Doorway Pec Stretch at 90 Degrees Abduction  - 1 x daily - 7 x weekly - 3 sets - 10 reps   PATIENT EDUCATION:  Education details: POC, Explanation of findings, issue HEP Person educated: Patient  through interpreter Education method: Explanation, Demonstration, Tactile cues, Verbal cues, and Handouts Education comprehension: verbalized understanding, returned demonstration, tactile cues required, and needs further education  HOME EXERCISE PROGRAM: Access Code: [email or web address] URL: [email or web address] Date: 03/28/2024 Prepared by: Graydon Dingwall  Exercises - Shoulder External Rotation and Scapular Retraction with Resistance  - 2 x daily - 7 x weekly - 3 sets - 10 reps - Scapular Retraction with Resistance  - 2 x daily - 7 x weekly - 3 sets - 10 reps - Shoulder Extension with Resistance - Palms Forward  - 1 x daily - 7 x weekly - 3 sets - 10 reps - Seated Scapular Retraction  - 1 x daily - 7 x weekly - 3 sets - 10 reps - Supine Cervical Retraction with Towel  - 1 x daily - 7 x weekly - 1 sets - 10 reps - Seated Levator Scapulae Stretch  - 1 x daily - 7 x weekly - 1 sets - 3 reps - 30 hold - Seated Cervical Sidebending Stretch  - 1 x daily - 7 x weekly - 1 sets - 3 reps - 30 hold - Doorway Pec Stretch at 90 Degrees Abduction  - 1 x daily - 7 x weekly - 3 sets - 10 reps  ASSESSMENT:  CLINICAL IMPRESSION: 04/17/2024 Patient presents to PT reporting continued high levels of pain and difficulty completing her home exercises due to pain. Session today continued to focus on periscapular strengthening and scapular stabilization. She completes all exercises, but is somewhat limited by pain throughout session. Patient continues to benefit from skilled PT services and should be progressed as able to improve functional independence.   EVAL Patient is a 49 y.o.  female  who was seen today for physical therapy evaluation and treatment for neck pain with cervical radiculopathy and hx of fibromyalgia/migraines. Pt has muscle tightness and R> L UE weakness and tenderness with nerve muscle tension tests. DNF test can only hold 5 sec  ( normal at minimum 37 sec). Pt continues to have weak deep neck flexors and has had 2 previous visits to this clinic She reports pain relief in PT but has difficulty with carryover into her daily life. Pt will benefit from skilled PT to address issues and improve carryover to home and household chores. .   OBJECTIVE IMPAIRMENTS: decreased activity tolerance, decreased coordination, decreased endurance, decreased mobility, decreased ROM, decreased strength, dizziness, increased fascial restrictions, increased muscle spasms, impaired  flexibility, impaired sensation, impaired UE functional use, postural dysfunction, obesity, and pain.   ACTIVITY LIMITATIONS: carrying, lifting, sleeping, bathing, toileting, dressing, reach over head, hygiene/grooming, and caring for others  PARTICIPATION LIMITATIONS: meal prep, cleaning, laundry, driving, shopping, and community activity  PERSONAL FACTORS: DMII, fibromyalgia , IBS, migraine, HTN, obesity, tachycardia  are also affecting patient's functional outcome.   REHAB POTENTIAL: Good  CLINICAL DECISION MAKING: Evolving/moderate complexity  EVALUATION COMPLEXITY: Moderate   GOALS: Goals reviewed with patient? Yes  SHORT TERM GOALS: Target date: 04-21-2024  Pt will be I with initial HEP Baseline: needs much reinforcement Goal status: INITIAL  2.  Pt will be able to maintain DNF test position for at least 10 sec Baseline: 5 sec Goal status: INITIAL    LONG TERM GOALS: Target date: 05-11-2024  Pt will be I with management and compliance with HEP  Baseline: needs reinforcement with compliance Goal status: INITIAL  2.  Pt will be able to perform household chores  with 50% decreased  pain/discomfort Baseline: at worst 10/10 Goal status: INITIAL  3.  Pt will demo R=L UE strength for lifting household items and carrying tasks at home Baseline:  R with decreased strength than L Goal status: INITIAL  4.  Pt will improve NDI to </= to 22/50  to demo MCID Baseline: 27/50 Goal status: INITIAL     PLAN:  PT FREQUENCY: 2x/week  PT DURATION: 6 weeks  PLANNED INTERVENTIONS: 97164- PT Re-evaluation, 97110-Therapeutic exercises, 97530- Therapeutic activity, 97112- Neuromuscular re-education, 97535- Self Care, [id number]- Manual therapy, [id number]- Electrical stimulation (unattended), [phone]- Ionotophoresis 4mg /ml Dexamethasone , 79439 (1-2 muscles), 20561 (3+ muscles)- Dry Needling, Patient/Family education, Taping, Joint mobilization, Spinal mobilization, Cryotherapy, and Moist heat  PLAN FOR NEXT SESSION: Review HEP and progress,    For all possible CPT codes, reference the Planned Interventions line above.     Check all conditions that are expected to impact treatment: {Conditions expected to impact treatment:Diabetes mellitus and Musculoskeletal disorders   If treatment provided at initial evaluation, no treatment charged due to lack of authorization.       Corean Pouch PTA  04/17/2024 11:29 AM    "

## 2024-04-19 ENCOUNTER — Encounter: Attending: Physical Medicine and Rehabilitation | Admitting: Physical Medicine and Rehabilitation

## 2024-04-19 ENCOUNTER — Encounter: Payer: Self-pay | Admitting: Physical Medicine and Rehabilitation

## 2024-04-19 VITALS — BP 108/74 | HR 106 | Ht <= 58 in | Wt 143.2 lb

## 2024-04-19 DIAGNOSIS — M7918 Myalgia, other site: Secondary | ICD-10-CM | POA: Diagnosis not present

## 2024-04-19 MED ORDER — LIDOCAINE HCL 1 % IJ SOLN
3.0000 mL | Freq: Once | INTRAMUSCULAR | Status: AC
  Administered 2024-04-19: 3 mL

## 2024-04-19 MED ORDER — PRAZOSIN HCL 1 MG PO CAPS: 1.0000 mg | ORAL_CAPSULE | Freq: Every day | ORAL | 3 refills | Status: AC

## 2024-04-19 MED ORDER — SODIUM CHLORIDE (PF) 0.9 % IJ SOLN: 3.0000 mL | INTRAMUSCULAR | Status: AC | PRN

## 2024-04-19 NOTE — Progress Notes (Signed)
 Trigger Point Injection  Indication: Cervical myofascial pain not relieved by medication management and other conservative care.  Informed consent was obtained after describing risk and benefits of the procedure with the patient, this includes bleeding, bruising, infection and medication side effects.  The patient wishes to proceed and has given written consent.  The patient was placed in a seated position.  The area of pain was marked and prepped with Betadine.  It was entered with a 25-gauge 1/2 inch needle and a total of 5 mL of 1% lidocaine  and normal saline was injected into a total of 4 trigger points, after negative draw back for blood.  The patient tolerated the procedure well.  Post procedure instructions were given.

## 2024-04-20 ENCOUNTER — Ambulatory Visit

## 2024-04-20 DIAGNOSIS — M542 Cervicalgia: Secondary | ICD-10-CM | POA: Diagnosis not present

## 2024-04-20 DIAGNOSIS — R293 Abnormal posture: Secondary | ICD-10-CM

## 2024-04-20 DIAGNOSIS — M6281 Muscle weakness (generalized): Secondary | ICD-10-CM

## 2024-04-20 NOTE — Therapy (Signed)
 " OUTPATIENT PHYSICAL THERAPY CERVICAL TREATMENT   Patient Name: Betty Cain MRN: [id number] DOB:05/05/1975, 49 y.o., female Today's Date: 04/20/2024  END OF SESSION:  PT End of Session - 04/20/24 1055     Visit Number 4    Number of Visits 13    Date for Recertification  05/11/24    Authorization Type [employer]    Authorization Time Period Approved 6 PT visits from 03/28/24-05/26/24    Authorization - Visit Number 3    Authorization - Number of Visits 6    Progress Note Due on Visit 6    PT Start Time 1045    PT Stop Time 1123    PT Time Calculation (min) 38 min    Activity Tolerance Patient tolerated treatment well    Behavior During Therapy [facility] for tasks assessed/performed            Past Medical History:  Diagnosis Date   Allergy     Anemia    Anxiety 01/2019   Asthma    B12 deficiency    Back pain    Common migraine with intractable migraine 06/28/2017   Constipation    Diabetes (HCC) 02/2019   Fatigue    Fibromyalgia    GERD (gastroesophageal reflux disease)    pos H pylori   Hypertension    controlled with medication   IBS (irritable bowel syndrome) 2005   Joint pain    Neonatal death    Vaginal delivery, full term-lived x2 hours.    Palpitations    Shortness of breath    Shortness of breath on exertion    Spinal headache    Swelling of both lower extremities    Valvular heart disease    Vitamin D  deficiency 02/2019   Past Surgical History:  Procedure Laterality Date   ADENOIDECTOMY     and tonsils (as a child)   BIOPSY  08/27/2021   Procedure: BIOPSY;  Surgeon: Leigh Elspeth SQUIBB, MD;  Location: [facility];  Service: Gastroenterology;;   boil  2003   right elbow   CESAREAN SECTION     x4   CESAREAN SECTION  06/02/2011   Procedure: CESAREAN SECTION;  Surgeon: Norleen LULLA Server, MD;  Location: [facility];  Service: Gynecology;  Laterality: N/A;  Primary Cesarean Section Delivery Baby Boy @ 0004, Apgars 9/9   CESAREAN SECTION N/A 12/10/2013    Procedure: REPEAT CESAREAN SECTION;  Surgeon: Winton Felt, MD;  Location: [facility];  Service: Obstetrics;  Laterality: N/A;   CESAREAN SECTION     colonoscopy  11/23/2018   stated had issues with sleep when in for procedure   COLONOSCOPY N/A 12/19/2023   Procedure: COLONOSCOPY;  Surgeon: Leigh Elspeth SQUIBB, MD;  Location: [facility];  Service: Gastroenterology;  Laterality: N/A;  patient had difficulty breathing during anesthesia thought due to sleep apnea.   ESOPHAGOGASTRODUODENOSCOPY (EGD) WITH PROPOFOL  N/A 08/27/2021   Procedure: ESOPHAGOGASTRODUODENOSCOPY (EGD) WITH PROPOFOL ;  Surgeon: Leigh Elspeth SQUIBB, MD;  Location: [facility];  Service: Gastroenterology;  Laterality: N/A;   OTHER SURGICAL HISTORY  2005   Uterine surgery    uterine cauterization     Patient Active Problem List   Diagnosis Date Noted   Allergic rhinitis 03/28/2024   Dermatitis 03/28/2024   Chronic chest pain 03/28/2024   History of colon polyps 12/19/2023   Dislocation of jaw 07/23/2022   Small vessel disease 06/29/2022   Type 2 diabetes mellitus with obesity 03/25/2022   Hypotension - iatrogenic 03/18/2022   Other chronic  pain 02/02/2022   Dyspepsia    Early satiety    IDA (iron deficiency anemia) 01/29/2021   Paroxysmal supraventricular tachycardia 10/01/2020   Class 1 obesity with serious comorbidity and body mass index (BMI) of 31.0 to 31.9 in adult 10/01/2020   Diabetes mellitus (HCC) 02/14/2020   Mixed diabetic hyperlipidemia associated with type 2 diabetes mellitus (HCC) 01/10/2020   Hypertension associated with diabetes (HCC) 01/10/2020   Diabetic neuropathy (HCC) 01/10/2020   Vitamin D  deficiency 01/10/2020   Diabetes mellitus with proteinuria (HCC) 08/20/2019   History of Helicobacter pylori infection 08/08/2019   Proteinuria 06/14/2019   Elevated sed rate 05/23/2019   Elevated C-reactive protein (CRP) 05/23/2019   Diabetes mellitus without complication (HCC) 05/23/2019   Dyslipidemia  (high LDL; low HDL) 05/23/2019   Type 2 diabetes mellitus with hyperglycemia, without long-term current use of insulin  (HCC) 02/17/2019   Hemoglobin A1C between 7% and 9% indicating borderline diabetic control (HCC) 02/17/2019   Breast cancer screening by mammogram 02/17/2019   Frequent headaches 02/14/2019   Anxiety 02/14/2019   Common migraine with intractable migraine 06/28/2017   Chest pain at rest 10/17/2016   Sinus tachycardia 08/18/2016   Prolonged Q-T interval on ECG 08/18/2016   Vertigo 07/22/2015   Abdominal discomfort 02/14/2015   Low back pain 02/14/2015   Abdominal pain 02/01/2015   Nausea without vomiting 02/01/2015   Environmental allergies 02/01/2015   Language barrier to communication 02/01/2015   Anemia 01/23/2015   Constipation 01/21/2015   Knee pain, bilateral 01/21/2015   Palpitations 09/18/2013   Shortness of breath 09/18/2013   Advanced maternal age in pregnancy in second trimester 06/19/2013   IBS (irritable bowel syndrome) 04/13/2003    PCP: Oley Bascom RAMAN, NP   REFERRING PROVIDER: Lorilee Sven SQUIBB, MD   REFERRING DIAG: M79.18 (ICD-10-CM) - Cervical myofascial pain syndrome   THERAPY DIAG:  Cervicalgia  Abnormal posture  Muscle weakness (generalized)  Rationale for Evaluation and Treatment: Rehabilitation  ONSET DATE: chronic but recent  SUBJECTIVE:                                                                                                                                                                                                         SUBJECTIVE STATEMENT: 04/20/2024 Pt had injections yesterday and is sore today. She notes decreased symptoms after PT.   EVAL I have been to [facility] about 3 x. I have been doing exercises but I am not doing better. I improve when I do PT but then it comes back. I have a pain doctor  and getting lidocaine  injections in the shoulder and he said I needed to go to PT for my neck and get  stronger. I feel pain into my hand when I have to wash dishes.  Even when sweeping and doing household chores, I feel sweaty and in pain.  I feel fine when I do exercises but pain always comes back. I even feel nauseated and weak Hand dominance: Right  PERTINENT HISTORY:  DMII, fibromyalgia , IBS, migraine, HTN, obesity, tachycardia   PAIN:  Are you having pain? Yes: NPRS scale: sometimes 10/10 at worst and 8 most of the time Pain location: neck and bil shoulders especially Right side Pain description: very   sharp and pulling Aggravating factors: any time I use my arms, household chores Relieving factors: lidocaine  injections and rest  PRECAUTIONS: None  RED FLAGS: None     WEIGHT BEARING RESTRICTIONS: No  FALLS:  Has patient fallen in last 6 months? No  LIVING ENVIRONMENT: Lives with: lives with their family and lives with their spouse Lives in: House/apartment Stairs: no issues with stairs Has following equipment at home: None  OCCUPATION: housewife   PLOF: Independent  PATIENT GOALS: decrease pain  NEXT MD VISIT: n/a  OBJECTIVE:  Note: Objective measures were completed at Evaluation unless otherwise noted.  DIAGNOSTIC FINDINGS:  Thoracic spine x-ray 04/09/22 (-) R shoulder x-ray 04/09/22 (-) No new imaging of her neck seen on Epic  PATIENT SURVEYS:  NDI:  NECK DISABILITY INDEX  Date: 03-28-24 Score  Pain intensity 2 = The pain is moderate at the moment  2. Personal care (washing, dressing, etc.) 1 =  I can look after myself normally but it causes extra pain  3. Lifting 3 = Pain prevents me from lifting heavy weights but I can manage light to medium   weights if they are conveniently positioned  4. Reading 3 = I can't read as much as I want because of moderate pain in my neck  5. Headaches 4 = I have severe headaches, which come frequently   6. Concentration 2 = I have a fair degree of difficulty in concentrating when I want to  7. Work 3 =  I cannot do my  usual work  8. Driving 3 = I can't drive my car as long as I want because of moderate pain in my neck  9. Sleeping 3 =  My sleep is moderately disturbed (2-3 hrs sleepless)  10. Recreation 3 = I am able to engage in a few of my usual recreation activities because of pain in   my neck  Total 27/50   Minimum Detectable Change (90% confidence): 5 points or 10% points  COGNITION: Overall cognitive status: Within functional limits for tasks assessed  SENSATION: Pt feels numbness down to all of my right fingers  sometimes left  POSTURE: rounded shoulders and forward head  PALPATION: TTP over bil UT  r > L  tenderness over bil cervical paraspinals   CERVICAL ROM:   Active ROM A/PROM (deg) eval  Flexion 40 pain  Extension 40  Right lateral flexion 25 tight and pain  Left lateral flexion 35 tight and pain  Right rotation 35  Left rotation 30 pulls/pain on R   (Blank rows = not tested)  UPPER EXTREMITY ROM:All WNL but with pain rising above 90 flex and abd  Active ROM Right eval Left eval  Shoulder flexion    Shoulder extension    Shoulder abduction    Shoulder adduction  Shoulder extension    Shoulder internal rotation    Shoulder external rotation    Elbow flexion    Elbow extension    Wrist flexion    Wrist extension    Wrist ulnar deviation    Wrist radial deviation    Wrist pronation    Wrist supination     (Blank rows = not tested)  UPPER EXTREMITY MMT:    MMT Right eval Left eval  Shoulder flexion 4+ 4+  Shoulder extension 3+ 3+  Shoulder abduction 4- 4  Shoulder adduction    Shoulder extension    Shoulder internal rotation 4- 4  Shoulder external rotation 4- 4  Middle trapezius    Lower trapezius    Elbow flexion    Elbow extension    Wrist flexion    Wrist extension    Wrist ulnar deviation    Wrist radial deviation    Wrist pronation    Wrist supination    Grip strength 32 lb 35lb   (Blank rows = not tested)  CERVICAL SPECIAL TESTS:   Neck flexor muscle endurance test: Positive, Upper limb tension test (ULTT): Positive, Spurling's test: Positive, and Distraction test: Positive  FUNCTIONAL TESTS:  5 times sit to stand: 9.58 sec  WNL DNFtest TREATMENT DATE:  [facility] Adult PT Treatment:                                                DATE: 04/20/24 Therapeutic Exercise: UBE level 1 3'/3' fwd/bwd Seated cervical ext over towel 10 hold x10 SNAG 5x5 hold B  Neuromuscular re-ed: Isometric cervical in all directions 10x5 hold Seated horizontal ABD YTB 2x10 Seated B ER YTB x10 BIL  Seated serratus wall slide YTB not all wall 2 x 10  Rows YTB 2x10 Shoulder extension YTB 2x10  [facility] Adult PT Treatment:                                                DATE: 04/11/24  Manual:  Active release B upper traps   Therapeutic exercise:  Isometric cervical in all directions 10x5 hold Supine horizontal ABD YTB x 10, RTB x 10  Serratus wall slide with foam roller  Serratus wall slide YTB x 10, not all wall x 10  Discussion of using a stool to mix food when it sits on the stove   EValuation and HEP issued with following exericises demo in session                                                                                                                              Shoulder External Rotation and Scapular Retraction with Resistance 2 x 10 Scapular  Retraction with RTB  - 2 x daily - 7 x weekly - 3 sets - 10 reps - Shoulder Extension with Resistance - Palms Forward  - 1 x daily - 7 x weekly - 3 sets - 10 reps - Seated Scapular Retraction  - 1 x daily - 7 x weekly - 3 sets - 10 reps - Supine Cervical Retraction with Towel  - 1 x daily - 7 x weekly - 1 sets - 10 reps - Seated Levator Scapulae Stretch  - 1 x daily - 7 x weekly - 1 sets - 3 reps - 30 hold - Seated Cervical Sidebending Stretch  - 1 x daily - 7 x weekly - 1 sets - 3 reps - 30 hold - Doorway Pec Stretch at 90 Degrees Abduction  - 1 x daily - 7 x weekly - 3 sets - 10  reps   PATIENT EDUCATION:  Education details: POC, Explanation of findings, issue HEP Person educated: Patient  through interpreter Education method: Explanation, Demonstration, Tactile cues, Verbal cues, and Handouts Education comprehension: verbalized understanding, returned demonstration, tactile cues required, and needs further education  HOME EXERCISE PROGRAM: Access Code: [email or web address] URL: [email or web address] Date: 03/28/2024 Prepared by: Graydon Dingwall  Exercises - Shoulder External Rotation and Scapular Retraction with Resistance  - 2 x daily - 7 x weekly - 3 sets - 10 reps - Scapular Retraction with Resistance  - 2 x daily - 7 x weekly - 3 sets - 10 reps - Shoulder Extension with Resistance - Palms Forward  - 1 x daily - 7 x weekly - 3 sets - 10 reps - Seated Scapular Retraction  - 1 x daily - 7 x weekly - 3 sets - 10 reps - Supine Cervical Retraction with Towel  - 1 x daily - 7 x weekly - 1 sets - 10 reps - Seated Levator Scapulae Stretch  - 1 x daily - 7 x weekly - 1 sets - 3 reps - 30 hold - Seated Cervical Sidebending Stretch  - 1 x daily - 7 x weekly - 1 sets - 3 reps - 30 hold - Doorway Pec Stretch at 90 Degrees Abduction  - 1 x daily - 7 x weekly - 3 sets - 10 reps  ASSESSMENT:  CLINICAL IMPRESSION: 04/20/2024 Manual deferred today due to injections being done yesterday. SNAGs added today to improve ROM. She was able to complete all exercises with minimal pain during seated horizontal ABD. Patient continues to benefit from skilled PT services and should be progressed as able to improve functional independence.   EVAL Patient is a 49 y.o. female  who was seen today for physical therapy evaluation and treatment for neck pain with cervical radiculopathy and hx of fibromyalgia/migraines. Pt has muscle tightness and R> L UE weakness and tenderness with nerve muscle tension tests. DNF test can only hold 5 sec  ( normal at minimum 37 sec). Pt continues to have  weak deep neck flexors and has had 2 previous visits to this clinic She reports pain relief in PT but has difficulty with carryover into her daily life. Pt will benefit from skilled PT to address issues and improve carryover to home and household chores. .   OBJECTIVE IMPAIRMENTS: decreased activity tolerance, decreased coordination, decreased endurance, decreased mobility, decreased ROM, decreased strength, dizziness, increased fascial restrictions, increased muscle spasms, impaired flexibility, impaired sensation, impaired UE functional use, postural dysfunction, obesity, and pain.   ACTIVITY LIMITATIONS: carrying, lifting, sleeping, bathing, toileting, dressing, reach over head,  hygiene/grooming, and caring for others  PARTICIPATION LIMITATIONS: meal prep, cleaning, laundry, driving, shopping, and community activity  PERSONAL FACTORS: DMII, fibromyalgia , IBS, migraine, HTN, obesity, tachycardia  are also affecting patient's functional outcome.   REHAB POTENTIAL: Good  CLINICAL DECISION MAKING: Evolving/moderate complexity  EVALUATION COMPLEXITY: Moderate   GOALS: Goals reviewed with patient? Yes  SHORT TERM GOALS: Target date: 04-21-2024  Pt will be I with initial HEP Baseline: needs much reinforcement Goal status: INITIAL  2.  Pt will be able to maintain DNF test position for at least 10 sec Baseline: 5 sec Goal status: INITIAL    LONG TERM GOALS: Target date: 05-11-2024  Pt will be I with management and compliance with HEP  Baseline: needs reinforcement with compliance Goal status: INITIAL  2.  Pt will be able to perform household chores  with 50% decreased pain/discomfort Baseline: at worst 10/10 Goal status: INITIAL  3.  Pt will demo R=L UE strength for lifting household items and carrying tasks at home Baseline:  R with decreased strength than L Goal status: INITIAL  4.  Pt will improve NDI to </= to 22/50  to demo MCID Baseline: 27/50 Goal status:  INITIAL     PLAN:  PT FREQUENCY: 2x/week  PT DURATION: 6 weeks  PLANNED INTERVENTIONS: 97164- PT Re-evaluation, 97110-Therapeutic exercises, 97530- Therapeutic activity, 97112- Neuromuscular re-education, 97535- Self Care, [id number]- Manual therapy, [id number]- Electrical stimulation (unattended), [phone]- Ionotophoresis 4mg /ml Dexamethasone , 79439 (1-2 muscles), 20561 (3+ muscles)- Dry Needling, Patient/Family education, Taping, Joint mobilization, Spinal mobilization, Cryotherapy, and Moist heat  PLAN FOR NEXT SESSION: Review HEP and progress,    For all possible CPT codes, reference the Planned Interventions line above.     Check all conditions that are expected to impact treatment: {Conditions expected to impact treatment:Diabetes mellitus and Musculoskeletal disorders   If treatment provided at initial evaluation, no treatment charged due to lack of authorization.       Marijo DELENA Berber PT  04/20/2024 12:42 PM    "

## 2024-04-23 ENCOUNTER — Ambulatory Visit: Payer: Self-pay | Admitting: Nurse Practitioner

## 2024-04-24 ENCOUNTER — Ambulatory Visit

## 2024-04-24 DIAGNOSIS — M542 Cervicalgia: Secondary | ICD-10-CM

## 2024-04-24 DIAGNOSIS — M6281 Muscle weakness (generalized): Secondary | ICD-10-CM

## 2024-04-24 DIAGNOSIS — R293 Abnormal posture: Secondary | ICD-10-CM

## 2024-04-24 DIAGNOSIS — M5413 Radiculopathy, cervicothoracic region: Secondary | ICD-10-CM

## 2024-04-24 NOTE — Therapy (Signed)
 " OUTPATIENT PHYSICAL THERAPY CERVICAL TREATMENT   Patient Name: Betty Cain MRN: [id number] DOB:1975/09/02, 49 y.o., female Today's Date: 04/24/2024  END OF SESSION:  PT End of Session - 04/24/24 1046     Visit Number 5    Number of Visits 13    Date for Recertification  05/11/24    Authorization Type [employer]    Authorization Time Period Approved 6 PT visits from 03/28/24-05/26/24    Authorization - Visit Number 4    Authorization - Number of Visits 6    Progress Note Due on Visit 6    PT Start Time 1045    PT Stop Time 1123    PT Time Calculation (min) 38 min    Activity Tolerance Patient tolerated treatment well    Behavior During Therapy [facility] for tasks assessed/performed             Past Medical History:  Diagnosis Date   Allergy     Anemia    Anxiety 01/2019   Asthma    B12 deficiency    Back pain    Common migraine with intractable migraine 06/28/2017   Constipation    Diabetes (HCC) 02/2019   Fatigue    Fibromyalgia    GERD (gastroesophageal reflux disease)    pos H pylori   Hypertension    controlled with medication   IBS (irritable bowel syndrome) 2005   Joint pain    Neonatal death    Vaginal delivery, full term-lived x2 hours.    Palpitations    Shortness of breath    Shortness of breath on exertion    Spinal headache    Swelling of both lower extremities    Valvular heart disease    Vitamin D  deficiency 02/2019   Past Surgical History:  Procedure Laterality Date   ADENOIDECTOMY     and tonsils (as a child)   BIOPSY  08/27/2021   Procedure: BIOPSY;  Surgeon: Leigh Elspeth SQUIBB, MD;  Location: [facility];  Service: Gastroenterology;;   boil  2003   right elbow   CESAREAN SECTION     x4   CESAREAN SECTION  06/02/2011   Procedure: CESAREAN SECTION;  Surgeon: Norleen LULLA Server, MD;  Location: [facility];  Service: Gynecology;  Laterality: N/A;  Primary Cesarean Section Delivery Baby Boy @ 0004, Apgars 9/9   CESAREAN SECTION N/A 12/10/2013    Procedure: REPEAT CESAREAN SECTION;  Surgeon: Winton Felt, MD;  Location: [facility];  Service: Obstetrics;  Laterality: N/A;   CESAREAN SECTION     colonoscopy  11/23/2018   stated had issues with sleep when in for procedure   COLONOSCOPY N/A 12/19/2023   Procedure: COLONOSCOPY;  Surgeon: Leigh Elspeth SQUIBB, MD;  Location: [facility];  Service: Gastroenterology;  Laterality: N/A;  patient had difficulty breathing during anesthesia thought due to sleep apnea.   ESOPHAGOGASTRODUODENOSCOPY (EGD) WITH PROPOFOL  N/A 08/27/2021   Procedure: ESOPHAGOGASTRODUODENOSCOPY (EGD) WITH PROPOFOL ;  Surgeon: Leigh Elspeth SQUIBB, MD;  Location: [facility];  Service: Gastroenterology;  Laterality: N/A;   OTHER SURGICAL HISTORY  2005   Uterine surgery    uterine cauterization     Patient Active Problem List   Diagnosis Date Noted   Allergic rhinitis 03/28/2024   Dermatitis 03/28/2024   Chronic chest pain 03/28/2024   History of colon polyps 12/19/2023   Dislocation of jaw 07/23/2022   Small vessel disease 06/29/2022   Type 2 diabetes mellitus with obesity 03/25/2022   Hypotension - iatrogenic 03/18/2022   Other  chronic pain 02/02/2022   Dyspepsia    Early satiety    IDA (iron deficiency anemia) 01/29/2021   Paroxysmal supraventricular tachycardia 10/01/2020   Class 1 obesity with serious comorbidity and body mass index (BMI) of 31.0 to 31.9 in adult 10/01/2020   Diabetes mellitus (HCC) 02/14/2020   Mixed diabetic hyperlipidemia associated with type 2 diabetes mellitus (HCC) 01/10/2020   Hypertension associated with diabetes (HCC) 01/10/2020   Diabetic neuropathy (HCC) 01/10/2020   Vitamin D  deficiency 01/10/2020   Diabetes mellitus with proteinuria (HCC) 08/20/2019   History of Helicobacter pylori infection 08/08/2019   Proteinuria 06/14/2019   Elevated sed rate 05/23/2019   Elevated C-reactive protein (CRP) 05/23/2019   Diabetes mellitus without complication (HCC) 05/23/2019   Dyslipidemia  (high LDL; low HDL) 05/23/2019   Type 2 diabetes mellitus with hyperglycemia, without long-term current use of insulin  (HCC) 02/17/2019   Hemoglobin A1C between 7% and 9% indicating borderline diabetic control (HCC) 02/17/2019   Breast cancer screening by mammogram 02/17/2019   Frequent headaches 02/14/2019   Anxiety 02/14/2019   Common migraine with intractable migraine 06/28/2017   Chest pain at rest 10/17/2016   Sinus tachycardia 08/18/2016   Prolonged Q-T interval on ECG 08/18/2016   Vertigo 07/22/2015   Abdominal discomfort 02/14/2015   Low back pain 02/14/2015   Abdominal pain 02/01/2015   Nausea without vomiting 02/01/2015   Environmental allergies 02/01/2015   Language barrier to communication 02/01/2015   Anemia 01/23/2015   Constipation 01/21/2015   Knee pain, bilateral 01/21/2015   Palpitations 09/18/2013   Shortness of breath 09/18/2013   Advanced maternal age in pregnancy in second trimester 06/19/2013   IBS (irritable bowel syndrome) 04/13/2003    PCP: Oley Bascom RAMAN, NP   REFERRING PROVIDER: Lorilee Sven SQUIBB, MD   REFERRING DIAG: M79.18 (ICD-10-CM) - Cervical myofascial pain syndrome   THERAPY DIAG:  Abnormal posture  Cervicalgia  Muscle weakness (generalized)  Radiculopathy, cervicothoracic region  Rationale for Evaluation and Treatment: Rehabilitation  ONSET DATE: chronic but recent  SUBJECTIVE:                                                                                                                                                                                                         SUBJECTIVE STATEMENT: 04/24/2024 Pt reports her pain is elevated today due to a busy weekend cooking. She does not feel the injections have helped.   EVAL I have been to [facility] street clinic about 3 x. I have been doing exercises but I am not doing better. I improve when I  do PT but then it comes back. I have a pain doctor and getting lidocaine  injections in  the shoulder and he said I needed to go to PT for my neck and get stronger. I feel pain into my hand when I have to wash dishes.  Even when sweeping and doing household chores, I feel sweaty and in pain.  I feel fine when I do exercises but pain always comes back. I even feel nauseated and weak Hand dominance: Right  PERTINENT HISTORY:  DMII, fibromyalgia , IBS, migraine, HTN, obesity, tachycardia   PAIN:  Are you having pain? Yes: NPRS scale: sometimes 10/10 at worst and 8 most of the time Pain location: neck and bil shoulders especially Right side Pain description: very   sharp and pulling Aggravating factors: any time I use my arms, household chores Relieving factors: lidocaine  injections and rest  PRECAUTIONS: None  RED FLAGS: None     WEIGHT BEARING RESTRICTIONS: No  FALLS:  Has patient fallen in last 6 months? No  LIVING ENVIRONMENT: Lives with: lives with their family and lives with their spouse Lives in: House/apartment Stairs: no issues with stairs Has following equipment at home: None  OCCUPATION: housewife   PLOF: Independent  PATIENT GOALS: decrease pain  NEXT MD VISIT: n/a  OBJECTIVE:  Note: Objective measures were completed at Evaluation unless otherwise noted.  DIAGNOSTIC FINDINGS:  Thoracic spine x-ray 04/09/22 (-) R shoulder x-ray 04/09/22 (-) No new imaging of her neck seen on Epic  PATIENT SURVEYS:  NDI:  NECK DISABILITY INDEX  Date: 03-28-24 Score  Pain intensity 2 = The pain is moderate at the moment  2. Personal care (washing, dressing, etc.) 1 =  I can look after myself normally but it causes extra pain  3. Lifting 3 = Pain prevents me from lifting heavy weights but I can manage light to medium   weights if they are conveniently positioned  4. Reading 3 = I can't read as much as I want because of moderate pain in my neck  5. Headaches 4 = I have severe headaches, which come frequently   6. Concentration 2 = I have a fair degree of  difficulty in concentrating when I want to  7. Work 3 =  I cannot do my usual work  8. Driving 3 = I can't drive my car as long as I want because of moderate pain in my neck  9. Sleeping 3 =  My sleep is moderately disturbed (2-3 hrs sleepless)  10. Recreation 3 = I am able to engage in a few of my usual recreation activities because of pain in   my neck  Total 27/50   Minimum Detectable Change (90% confidence): 5 points or 10% points  COGNITION: Overall cognitive status: Within functional limits for tasks assessed  SENSATION: Pt feels numbness down to all of my right fingers  sometimes left  POSTURE: rounded shoulders and forward head  PALPATION: TTP over bil UT  r > L  tenderness over bil cervical paraspinals   CERVICAL ROM:   Active ROM A/PROM (deg) eval  Flexion 40 pain  Extension 40  Right lateral flexion 25 tight and pain  Left lateral flexion 35 tight and pain  Right rotation 35  Left rotation 30 pulls/pain on R   (Blank rows = not tested)  UPPER EXTREMITY ROM:All WNL but with pain rising above 90 flex and abd  Active ROM Right eval Left eval  Shoulder flexion    Shoulder extension  Shoulder abduction    Shoulder adduction    Shoulder extension    Shoulder internal rotation    Shoulder external rotation    Elbow flexion    Elbow extension    Wrist flexion    Wrist extension    Wrist ulnar deviation    Wrist radial deviation    Wrist pronation    Wrist supination     (Blank rows = not tested)  UPPER EXTREMITY MMT:    MMT Right eval Left eval  Shoulder flexion 4+ 4+  Shoulder extension 3+ 3+  Shoulder abduction 4- 4  Shoulder adduction    Shoulder extension    Shoulder internal rotation 4- 4  Shoulder external rotation 4- 4  Middle trapezius    Lower trapezius    Elbow flexion    Elbow extension    Wrist flexion    Wrist extension    Wrist ulnar deviation    Wrist radial deviation    Wrist pronation    Wrist supination    Grip  strength 32 lb 35lb   (Blank rows = not tested)  CERVICAL SPECIAL TESTS:  Neck flexor muscle endurance test: Positive, Upper limb tension test (ULTT): Positive, Spurling's test: Positive, and Distraction test: Positive  FUNCTIONAL TESTS:  5 times sit to stand: 9.58 sec  WNL DNFtest TREATMENT DATE:  [facility] Adult PT Treatment:                                                DATE: 04/24/24 Therapeutic Exercise: UBE level 1 3'/3' fwd/bwd Seated cervical ext over towel 10 hold x10 SNAG 5x10 hold B  Neuromuscular re-ed: Supine, 1/2 foam roll parallel to spine, chest press x 10, 6# DB x 10 Seated horizontal ABD YTB 2x10 Seated B ER YTB x10 BIL  Seated serratus wall slide YTB not all wall 2 x 10  Rows YTB 2x10 Shoulder extension YTB 2x10 Manual:  Active release B upper traps   [facility] Adult PT Treatment:                                                DATE: 04/11/24  Manual:  Active release B upper traps   Therapeutic exercise:  Isometric cervical in all directions 10x5 hold Supine horizontal ABD YTB x 10, RTB x 10  Serratus wall slide with foam roller  Serratus wall slide YTB x 10, not all wall x 10  Discussion of using a stool to mix food when it sits on the stove   EValuation and HEP issued with following exericises demo in session  Shoulder External Rotation and Scapular Retraction with Resistance 2 x 10 Scapular Retraction with RTB  - 2 x daily - 7 x weekly - 3 sets - 10 reps - Shoulder Extension with Resistance - Palms Forward  - 1 x daily - 7 x weekly - 3 sets - 10 reps - Seated Scapular Retraction  - 1 x daily - 7 x weekly - 3 sets - 10 reps - Supine Cervical Retraction with Towel  - 1 x daily - 7 x weekly - 1 sets - 10 reps - Seated Levator Scapulae Stretch  - 1 x daily - 7 x weekly - 1 sets - 3 reps - 30 hold - Seated Cervical Sidebending Stretch  - 1 x  daily - 7 x weekly - 1 sets - 3 reps - 30 hold - Doorway Pec Stretch at 90 Degrees Abduction  - 1 x daily - 7 x weekly - 3 sets - 10 reps   PATIENT EDUCATION:  Education details: POC, Explanation of findings, issue HEP Person educated: Patient  through interpreter Education method: Explanation, Demonstration, Tactile cues, Verbal cues, and Handouts Education comprehension: verbalized understanding, returned demonstration, tactile cues required, and needs further education  HOME EXERCISE PROGRAM: Access Code: [email or web address] URL: [email or web address] Date: 03/28/2024 Prepared by: Graydon Dingwall  Exercises - Shoulder External Rotation and Scapular Retraction with Resistance  - 2 x daily - 7 x weekly - 3 sets - 10 reps - Scapular Retraction with Resistance  - 2 x daily - 7 x weekly - 3 sets - 10 reps - Shoulder Extension with Resistance - Palms Forward  - 1 x daily - 7 x weekly - 3 sets - 10 reps - Seated Scapular Retraction  - 1 x daily - 7 x weekly - 3 sets - 10 reps - Supine Cervical Retraction with Towel  - 1 x daily - 7 x weekly - 1 sets - 10 reps - Seated Levator Scapulae Stretch  - 1 x daily - 7 x weekly - 1 sets - 3 reps - 30 hold - Seated Cervical Sidebending Stretch  - 1 x daily - 7 x weekly - 1 sets - 3 reps - 30 hold - Doorway Pec Stretch at 90 Degrees Abduction  - 1 x daily - 7 x weekly - 3 sets - 10 reps  ASSESSMENT:  CLINICAL IMPRESSION: 04/24/2024 Today's session reintroduced manual to reduce pain and improve mobility. Pt introduced to active muscle lengthening exercises for the pecs. She noted difficulty but no pain.  Patient continues to benefit from skilled PT services and should be progressed as able to improve functional independence.   EVAL Patient is a 49 y.o. female  who was seen today for physical therapy evaluation and treatment for neck pain with cervical radiculopathy and hx of fibromyalgia/migraines. Pt has muscle tightness and R> L UE weakness and  tenderness with nerve muscle tension tests. DNF test can only hold 5 sec  ( normal at minimum 37 sec). Pt continues to have weak deep neck flexors and has had 2 previous visits to this clinic She reports pain relief in PT but has difficulty with carryover into her daily life. Pt will benefit from skilled PT to address issues and improve carryover to home and household chores. .   OBJECTIVE IMPAIRMENTS: decreased activity tolerance, decreased coordination, decreased endurance, decreased mobility, decreased ROM, decreased strength, dizziness, increased fascial restrictions, increased muscle spasms, impaired flexibility, impaired sensation, impaired UE functional use, postural dysfunction, obesity, and pain.   ACTIVITY  LIMITATIONS: carrying, lifting, sleeping, bathing, toileting, dressing, reach over head, hygiene/grooming, and caring for others  PARTICIPATION LIMITATIONS: meal prep, cleaning, laundry, driving, shopping, and community activity  PERSONAL FACTORS: DMII, fibromyalgia , IBS, migraine, HTN, obesity, tachycardia  are also affecting patient's functional outcome.   REHAB POTENTIAL: Good  CLINICAL DECISION MAKING: Evolving/moderate complexity  EVALUATION COMPLEXITY: Moderate   GOALS: Goals reviewed with patient? Yes  SHORT TERM GOALS: Target date: 04-21-2024  Pt will be I with initial HEP Baseline: needs much reinforcement Goal status: INITIAL  2.  Pt will be able to maintain DNF test position for at least 10 sec Baseline: 5 sec Goal status: INITIAL    LONG TERM GOALS: Target date: 05-11-2024  Pt will be I with management and compliance with HEP  Baseline: needs reinforcement with compliance Goal status: INITIAL  2.  Pt will be able to perform household chores  with 50% decreased pain/discomfort Baseline: at worst 10/10 Goal status: INITIAL  3.  Pt will demo R=L UE strength for lifting household items and carrying tasks at home Baseline:  R with decreased strength than  L Goal status: INITIAL  4.  Pt will improve NDI to </= to 22/50  to demo MCID Baseline: 27/50 Goal status: INITIAL     PLAN:  PT FREQUENCY: 2x/week  PT DURATION: 6 weeks  PLANNED INTERVENTIONS: 97164- PT Re-evaluation, 97110-Therapeutic exercises, 97530- Therapeutic activity, 97112- Neuromuscular re-education, 97535- Self Care, [id number]- Manual therapy, [id number]- Electrical stimulation (unattended), [phone]- Ionotophoresis 4mg /ml Dexamethasone , 79439 (1-2 muscles), 20561 (3+ muscles)- Dry Needling, Patient/Family education, Taping, Joint mobilization, Spinal mobilization, Cryotherapy, and Moist heat  PLAN FOR NEXT SESSION: Review HEP and progress,    For all possible CPT codes, reference the Planned Interventions line above.     Check all conditions that are expected to impact treatment: {Conditions expected to impact treatment:Diabetes mellitus and Musculoskeletal disorders   If treatment provided at initial evaluation, no treatment charged due to lack of authorization.       Marijo DELENA Berber PT  04/24/2024 11:07 AM    "

## 2024-04-26 ENCOUNTER — Other Ambulatory Visit: Payer: Self-pay | Admitting: Nurse Practitioner

## 2024-04-26 ENCOUNTER — Ambulatory Visit

## 2024-04-26 DIAGNOSIS — Z0000 Encounter for general adult medical examination without abnormal findings: Secondary | ICD-10-CM

## 2024-04-28 ENCOUNTER — Other Ambulatory Visit: Payer: Self-pay | Admitting: Gastroenterology

## 2024-04-28 ENCOUNTER — Other Ambulatory Visit: Payer: Self-pay | Admitting: Adult Health

## 2024-04-28 ENCOUNTER — Other Ambulatory Visit: Payer: Self-pay | Admitting: Nurse Practitioner

## 2024-04-28 DIAGNOSIS — G43109 Migraine with aura, not intractable, without status migrainosus: Secondary | ICD-10-CM

## 2024-04-30 NOTE — Telephone Encounter (Signed)
 pantoprazole  (PROTONIX ) 40 MG tablet [Pharmacy Med Name: Pantoprazole  Sodium 40 MG Oral Tablet Delayed Release]

## 2024-04-30 NOTE — Telephone Encounter (Signed)
 Please advise medication is historical. CB

## 2024-05-01 ENCOUNTER — Ambulatory Visit

## 2024-05-01 DIAGNOSIS — R293 Abnormal posture: Secondary | ICD-10-CM

## 2024-05-01 DIAGNOSIS — M542 Cervicalgia: Secondary | ICD-10-CM | POA: Diagnosis not present

## 2024-05-01 DIAGNOSIS — M5413 Radiculopathy, cervicothoracic region: Secondary | ICD-10-CM

## 2024-05-01 DIAGNOSIS — M6281 Muscle weakness (generalized): Secondary | ICD-10-CM

## 2024-05-01 NOTE — Therapy (Signed)
 " OUTPATIENT PHYSICAL THERAPY RECERT  Progress Note Reporting Period 03/28/2024 to 05/01/2024  See note below for Objective Data and Assessment of Progress/Goals.     Patient Name: Betty Cain MRN: [id number] DOB:[birth year], 49 y.o., female Today's Date: 05/01/2024  END OF SESSION:  PT End of Session - 05/01/24 1037     Visit Number 6    Number of Visits 13    Date for Recertification  05/11/24    Authorization Type [employer]    Authorization Time Period Approved 6 PT visits from 03/28/24-05/26/24    Authorization - Visit Number 5    Authorization - Number of Visits 6    Progress Note Due on Visit 6    PT Start Time 1045    PT Stop Time 1123    PT Time Calculation (min) 38 min    Activity Tolerance Patient tolerated treatment well    Behavior During Therapy [facility] for tasks assessed/performed          Past Medical History:  Diagnosis Date   Allergy     Anemia    Anxiety 01/2019   Asthma    B12 deficiency    Back pain    Common migraine with intractable migraine 06/28/2017   Constipation    Diabetes (HCC) 02/2019   Fatigue    Fibromyalgia    GERD (gastroesophageal reflux disease)    pos H pylori   Hypertension    controlled with medication   IBS (irritable bowel syndrome) 2005   Joint pain    Neonatal death    Vaginal delivery, full term-lived x2 hours.    Palpitations    Shortness of breath    Shortness of breath on exertion    Spinal headache    Swelling of both lower extremities    Valvular heart disease    Vitamin D  deficiency 02/2019   Past Surgical History:  Procedure Laterality Date   ADENOIDECTOMY     and tonsils (as a child)   BIOPSY  08/27/2021   Procedure: BIOPSY;  Surgeon: Leigh Elspeth SQUIBB, MD;  Location: [facility];  Service: Gastroenterology;;   boil  2003   right elbow   CESAREAN SECTION     x4   CESAREAN SECTION  06/02/2011   Procedure: CESAREAN SECTION;  Surgeon: Norleen LULLA Server, MD;  Location: [facility];  Service: Gynecology;   Laterality: N/A;  Primary Cesarean Section Delivery Baby Boy @ 0004, Apgars 9/9   CESAREAN SECTION N/A 12/10/2013   Procedure: REPEAT CESAREAN SECTION;  Surgeon: Winton Felt, MD;  Location: [facility];  Service: Obstetrics;  Laterality: N/A;   CESAREAN SECTION     colonoscopy  11/23/2018   stated had issues with sleep when in for procedure   COLONOSCOPY N/A 12/19/2023   Procedure: COLONOSCOPY;  Surgeon: Leigh Elspeth SQUIBB, MD;  Location: [facility];  Service: Gastroenterology;  Laterality: N/A;  patient had difficulty breathing during anesthesia thought due to sleep apnea.   ESOPHAGOGASTRODUODENOSCOPY (EGD) WITH PROPOFOL  N/A 08/27/2021   Procedure: ESOPHAGOGASTRODUODENOSCOPY (EGD) WITH PROPOFOL ;  Surgeon: Leigh Elspeth SQUIBB, MD;  Location: [facility];  Service: Gastroenterology;  Laterality: N/A;   OTHER SURGICAL HISTORY  2005   Uterine surgery    uterine cauterization     Patient Active Problem List   Diagnosis Date Noted   Allergic rhinitis 03/28/2024   Dermatitis 03/28/2024   Chronic chest pain 03/28/2024   History of colon polyps 12/19/2023   Dislocation of jaw 07/23/2022   Small vessel disease 06/29/2022  Type 2 diabetes mellitus with obesity 03/25/2022   Hypotension - iatrogenic 03/18/2022   Other chronic pain 02/02/2022   Dyspepsia    Early satiety    IDA (iron deficiency anemia) 01/29/2021   Paroxysmal supraventricular tachycardia 10/01/2020   Class 1 obesity with serious comorbidity and body mass index (BMI) of 31.0 to 31.9 in adult 10/01/2020   Diabetes mellitus (HCC) 02/14/2020   Mixed diabetic hyperlipidemia associated with type 2 diabetes mellitus (HCC) 01/10/2020   Hypertension associated with diabetes (HCC) 01/10/2020   Diabetic neuropathy (HCC) 01/10/2020   Vitamin D  deficiency 01/10/2020   Diabetes mellitus with proteinuria (HCC) 08/20/2019   History of Helicobacter pylori infection 08/08/2019   Proteinuria 06/14/2019   Elevated sed rate 05/23/2019    Elevated C-reactive protein (CRP) 05/23/2019   Diabetes mellitus without complication (HCC) 05/23/2019   Dyslipidemia (high LDL; low HDL) 05/23/2019   Type 2 diabetes mellitus with hyperglycemia, without long-term current use of insulin  (HCC) 02/17/2019   Hemoglobin A1C between 7% and 9% indicating borderline diabetic control (HCC) 02/17/2019   Breast cancer screening by mammogram 02/17/2019   Frequent headaches 02/14/2019   Anxiety 02/14/2019   Common migraine with intractable migraine 06/28/2017   Chest pain at rest 10/17/2016   Sinus tachycardia 08/18/2016   Prolonged Q-T interval on ECG 08/18/2016   Vertigo 07/22/2015   Abdominal discomfort 02/14/2015   Low back pain 02/14/2015   Abdominal pain 02/01/2015   Nausea without vomiting 02/01/2015   Environmental allergies 02/01/2015   Language barrier to communication 02/01/2015   Anemia 01/23/2015   Constipation 01/21/2015   Knee pain, bilateral 01/21/2015   Palpitations 09/18/2013   Shortness of breath 09/18/2013   Advanced maternal age in pregnancy in second trimester 06/19/2013   IBS (irritable bowel syndrome) 04/13/2003    PCP: Oley Bascom RAMAN, NP   REFERRING PROVIDER: Lorilee Sven SQUIBB, MD   REFERRING DIAG: M79.18 (ICD-10-CM) - Cervical myofascial pain syndrome   THERAPY DIAG:  Abnormal posture  Cervicalgia  Muscle weakness (generalized)  Radiculopathy, cervicothoracic region  Rationale for Evaluation and Treatment: Rehabilitation  ONSET DATE: chronic but recent  SUBJECTIVE:                                                                                                                                                                                                         SUBJECTIVE STATEMENT: Re-Cert:  Pt reports on change in pain since the start of PT. She notes her HEP is causing increased pain. Pt does have symptom improvement after exiting session.   EVAL I have  been to [facility] about 3 x.  I have been doing exercises but I am not doing better. I improve when I do PT but then it comes back. I have a pain doctor and getting lidocaine  injections in the shoulder and he said I needed to go to PT for my neck and get stronger. I feel pain into my hand when I have to wash dishes.  Even when sweeping and doing household chores, I feel sweaty and in pain.  I feel fine when I do exercises but pain always comes back. I even feel nauseated and weak Hand dominance: Right  PERTINENT HISTORY:  DMII, fibromyalgia , IBS, migraine, HTN, obesity, tachycardia   PAIN:  Are you having pain? Yes: NPRS scale: sometimes 10/10 at worst and 8 most of the time Pain location: neck and bil shoulders especially Right side Pain description: very   sharp and pulling Aggravating factors: any time I use my arms, household chores Relieving factors: lidocaine  injections and rest  PRECAUTIONS: None  RED FLAGS: None     WEIGHT BEARING RESTRICTIONS: No  FALLS:  Has patient fallen in last 6 months? No  LIVING ENVIRONMENT: Lives with: lives with their family and lives with their spouse Lives in: House/apartment Stairs: no issues with stairs Has following equipment at home: None  OCCUPATION: housewife   PLOF: Independent  PATIENT GOALS: decrease pain  NEXT MD VISIT: n/a  OBJECTIVE:  Note: Objective measures were completed at Evaluation unless otherwise noted.  DIAGNOSTIC FINDINGS:  Thoracic spine x-ray 04/09/22 (-) R shoulder x-ray 04/09/22 (-) No new imaging of her neck seen on Epic  PATIENT SURVEYS:  NDI:  NECK DISABILITY INDEX 05/01/2024  Date: 03-28-24 Score Score  Pain intensity 2 = The pain is moderate at the moment 3  2. Personal care (washing, dressing, etc.) 1 =  I can look after myself normally but it causes extra pain 1  3. Lifting 3 = Pain prevents me from lifting heavy weights but I can manage light to medium   weights if they are conveniently positioned 2  4. Reading 3 = I can't  read as much as I want because of moderate pain in my neck 3  5. Headaches 4 = I have severe headaches, which come frequently  4  6. Concentration 2 = I have a fair degree of difficulty in concentrating when I want to 3  7. Work 3 =  I cannot do my usual work 1  8. Driving 3 = I can't drive my car as long as I want because of moderate pain in my neck 2  9. Sleeping 3 =  My sleep is moderately disturbed (2-3 hrs sleepless) 1  10. Recreation 3 = I am able to engage in a few of my usual recreation activities because of pain in   my neck 2  Total 27/50 22/50   Minimum Detectable Change (90% confidence): 5 points or 10% points  COGNITION: Overall cognitive status: Within functional limits for tasks assessed  SENSATION: Pt feels numbness down to all of my right fingers  sometimes left  POSTURE: rounded shoulders and forward head  PALPATION: TTP over bil UT  r > L  tenderness over bil cervical paraspinals   CERVICAL ROM:   Active ROM A/PROM (deg) eval 05/01/2024  Flexion 40 pain 100%  Extension 40 75% pain  Right lateral flexion 25 tight and pain 100% stretch  Left lateral flexion 35 tight and pain 100% stretch  Right rotation  35 100%  Left rotation 30 pulls/pain on R 100%   (Blank rows = not tested)  UPPER EXTREMITY ROM:All WNL but with pain rising above 90 flex and abd      WNL - pain at 160 ABD B  Active ROM Right eval Left eval  Shoulder flexion    Shoulder extension    Shoulder abduction    Shoulder adduction    Shoulder extension    Shoulder internal rotation    Shoulder external rotation    Elbow flexion    Elbow extension    Wrist flexion    Wrist extension    Wrist ulnar deviation    Wrist radial deviation    Wrist pronation    Wrist supination     (Blank rows = not tested)  UPPER EXTREMITY MMT:    MMT Right eval Left eval Right 05/01/24 Left  05/01/24  Shoulder flexion 4+ 4+ 4+ 4+  Shoulder extension 3+ 3+ 4+ 4+  Shoulder abduction 4- 4     Shoulder adduction      Shoulder extension      Shoulder internal rotation 4- 4 5 5   Shoulder external rotation 4- 4 5 5   Middle trapezius      Lower trapezius      Elbow flexion      Elbow extension      Wrist flexion      Wrist extension      Wrist ulnar deviation      Wrist radial deviation      Wrist pronation      Wrist supination      Grip strength 32 lb 35lb     (Blank rows = not tested)  CERVICAL SPECIAL TESTS:  Neck flexor muscle endurance test: Positive, Upper limb tension test (ULTT): Positive, Spurling's test: Positive, and Distraction test: Positive  FUNCTIONAL TESTS:  5 times sit to stand: 9.58 sec  WNL DNFtest   TREATMENT DATE:  [facility] Adult PT Treatment:                                                DATE: 05/01/2024   Therapeutic Activity:  Tests and measures   [facility] Adult PT Treatment:                                                DATE: 04/24/24 Therapeutic Exercise: UBE level 1 3'/3' fwd/bwd Seated cervical ext over towel 10 hold x10 SNAG 5x10 hold B  Neuromuscular re-ed: Supine, 1/2 foam roll parallel to spine, chest press x 10, 6# DB x 10 Seated horizontal ABD YTB 2x10 Seated B ER YTB x10 BIL  Seated serratus wall slide YTB not all wall 2 x 10  Rows YTB 2x10 Shoulder extension YTB 2x10 Manual:  Active release B upper traps   [facility] Adult PT Treatment:                                                DATE: 04/11/24  Manual:  Active release B upper traps   Therapeutic exercise:  Isometric cervical in all directions 10x5  hold Supine horizontal ABD YTB x 10, RTB x 10  Serratus wall slide with foam roller  Serratus wall slide YTB x 10, not all wall x 10  Discussion of using a stool to mix food when it sits on the stove   EValuation and HEP issued with following exericises demo in session                                                                                                                              Shoulder External Rotation and  Scapular Retraction with Resistance 2 x 10 Scapular Retraction with RTB  - 2 x daily - 7 x weekly - 3 sets - 10 reps - Shoulder Extension with Resistance - Palms Forward  - 1 x daily - 7 x weekly - 3 sets - 10 reps - Seated Scapular Retraction  - 1 x daily - 7 x weekly - 3 sets - 10 reps - Supine Cervical Retraction with Towel  - 1 x daily - 7 x weekly - 1 sets - 10 reps - Seated Levator Scapulae Stretch  - 1 x daily - 7 x weekly - 1 sets - 3 reps - 30 hold - Seated Cervical Sidebending Stretch  - 1 x daily - 7 x weekly - 1 sets - 3 reps - 30 hold - Doorway Pec Stretch at 90 Degrees Abduction  - 1 x daily - 7 x weekly - 3 sets - 10 reps   PATIENT EDUCATION:  Education details: POC, Explanation of findings, issue HEP Person educated: Patient  through interpreter Education method: Explanation, Demonstration, Tactile cues, Verbal cues, and Handouts Education comprehension: verbalized understanding, returned demonstration, tactile cues required, and needs further education  HOME EXERCISE PROGRAM: Access Code: [email or web address] URL: [email or web address] Date: 05/01/2024 Prepared by: Marijo Berber  Exercises - Seated Assisted Cervical Rotation with Towel  - 1 x daily - 7 x weekly - 3 sets - 10 reps - Cervical Extension AROM with Strap  - 1 x daily - 7 x weekly - 3 sets - 10 reps - Seated Shoulder W External Rotation on Swiss Ball  - 1 x daily - 7 x weekly - 3 sets - 10 reps - Standing Shoulder Row with Anchored Resistance  - 1 x daily - 7 x weekly - 3 sets - 10 reps - Shoulder extension with resistance - Neutral  - 1 x daily - 7 x weekly - 3 sets - 10 reps  ASSESSMENT:  CLINICAL IMPRESSION: Re-Cert:   Pt has attended 6 physical therapy visits to address her neck pain and radiculopathy. Pt reports no change in symptoms. She has improved her cervical ROM, shoulder strength, and cervical endurance. She continues to have poor postural awareness leading to postural weakness. Functional  deficits include cooking, cleaning, lifting, and other ADLs. Pt was given an updated HEP.  Patient continues to benefit from skilled PT services and should be progressed as able to improve functional independence.  EVAL Patient is a 49 y.o. female  who was seen today for physical therapy evaluation and treatment for neck pain with cervical radiculopathy and hx of fibromyalgia/migraines. Pt has muscle tightness and R> L UE weakness and tenderness with nerve muscle tension tests. DNF test can only hold 5 sec  ( normal at minimum 37 sec). Pt continues to have weak deep neck flexors and has had 2 previous visits to this clinic She reports pain relief in PT but has difficulty with carryover into her daily life. Pt will benefit from skilled PT to address issues and improve carryover to home and household chores. .   OBJECTIVE IMPAIRMENTS: decreased activity tolerance, decreased coordination, decreased endurance, decreased mobility, decreased ROM, decreased strength, dizziness, increased fascial restrictions, increased muscle spasms, impaired flexibility, impaired sensation, impaired UE functional use, postural dysfunction, obesity, and pain.   ACTIVITY LIMITATIONS: carrying, lifting, sleeping, bathing, toileting, dressing, reach over head, hygiene/grooming, and caring for others  PARTICIPATION LIMITATIONS: meal prep, cleaning, laundry, driving, shopping, and community activity  PERSONAL FACTORS: DMII, fibromyalgia , IBS, migraine, HTN, obesity, tachycardia  are also affecting patient's functional outcome.   REHAB POTENTIAL: Good  CLINICAL DECISION MAKING: Evolving/moderate complexity  EVALUATION COMPLEXITY: Moderate   GOALS: Goals reviewed with patient? Yes  SHORT TERM GOALS: Target date: 04-21-2024  Pt will be I with initial HEP Baseline: needs much reinforcement Goal status: MET  2.  Pt will be able to maintain DNF test position for at least 10 sec Baseline: 5 sec 05/01/2024: 26 sec Goal  status: MET    LONG TERM GOALS: Target date: 06-12-2024  Pt will be I with management and compliance with HEP  Baseline: needs reinforcement with compliance Goal status: MET  2.  Pt will be able to perform household chores  with 50% decreased pain/discomfort Baseline: at worst 10/10 05/01/2024: 9/10 Goal status: INITIAL  3.  Pt will demo R=L UE strength for lifting household items and carrying tasks at home Baseline:  R with decreased strength than L 05/01/2024: see objective  Goal status: MET  4.  Pt will improve NDI to </= to 22/50  to demo MCID Baseline: 27/50 05/01/2024: 22/50 Goal status: MET     PLAN:  PT FREQUENCY: 2x/week  PT DURATION: 6 weeks  PLANNED INTERVENTIONS: 97164- PT Re-evaluation, 97110-Therapeutic exercises, 97530- Therapeutic activity, 97112- Neuromuscular re-education, 97535- Self Care, [id number]- Manual therapy, [id number]- Electrical stimulation (unattended), [phone]- Ionotophoresis 4mg /ml Dexamethasone , 79439 (1-2 muscles), 20561 (3+ muscles)- Dry Needling, Patient/Family education, Taping, Joint mobilization, Spinal mobilization, Cryotherapy, and Moist heat  PLAN FOR NEXT SESSION: Review HEP and progress,    For all possible CPT codes, reference the Planned Interventions line above.     Check all conditions that are expected to impact treatment: {Conditions expected to impact treatment:Diabetes mellitus and Musculoskeletal disorders   If treatment provided at initial evaluation, no treatment charged due to lack of authorization.       Marijo DELENA Berber PT  05/01/24 11:34 AM    "

## 2024-05-03 ENCOUNTER — Ambulatory Visit

## 2024-05-03 DIAGNOSIS — M6281 Muscle weakness (generalized): Secondary | ICD-10-CM

## 2024-05-03 DIAGNOSIS — M5413 Radiculopathy, cervicothoracic region: Secondary | ICD-10-CM

## 2024-05-03 DIAGNOSIS — M542 Cervicalgia: Secondary | ICD-10-CM | POA: Diagnosis not present

## 2024-05-03 DIAGNOSIS — R293 Abnormal posture: Secondary | ICD-10-CM

## 2024-05-03 NOTE — Therapy (Signed)
 " OUTPATIENT PHYSICAL THERAPY TREATMENT NOTE     Patient Name: Betty Cain MRN: [id number] DOB:08/06/1975, 49 y.o., female Today's Date: 05/03/2024  END OF SESSION:  PT End of Session - 05/03/24 1040     Visit Number 7    Number of Visits 13    Date for Recertification  05/11/24    Authorization Type [employer]    Authorization Time Period Approved 6 PT visits from 03/28/24-05/26/24    Authorization - Visit Number 6    Authorization - Number of Visits 6    Progress Note Due on Visit 6    PT Start Time 1045    PT Stop Time 1123    PT Time Calculation (min) 38 min    Activity Tolerance Patient tolerated treatment well    Behavior During Therapy [facility] for tasks assessed/performed           Past Medical History:  Diagnosis Date   Allergy     Anemia    Anxiety 01/2019   Asthma    B12 deficiency    Back pain    Common migraine with intractable migraine 06/28/2017   Constipation    Diabetes (HCC) 02/2019   Fatigue    Fibromyalgia    GERD (gastroesophageal reflux disease)    pos H pylori   Hypertension    controlled with medication   IBS (irritable bowel syndrome) 2005   Joint pain    Neonatal death    Vaginal delivery, full term-lived x2 hours.    Palpitations    Shortness of breath    Shortness of breath on exertion    Spinal headache    Swelling of both lower extremities    Valvular heart disease    Vitamin D  deficiency 02/2019   Past Surgical History:  Procedure Laterality Date   ADENOIDECTOMY     and tonsils (as a child)   BIOPSY  08/27/2021   Procedure: BIOPSY;  Surgeon: Leigh Elspeth SQUIBB, MD;  Location: [facility];  Service: Gastroenterology;;   boil  2003   right elbow   CESAREAN SECTION     x4   CESAREAN SECTION  06/02/2011   Procedure: CESAREAN SECTION;  Surgeon: Norleen LULLA Server, MD;  Location: [facility];  Service: Gynecology;  Laterality: N/A;  Primary Cesarean Section Delivery Baby Boy @ 0004, Apgars 9/9   CESAREAN SECTION N/A 12/10/2013    Procedure: REPEAT CESAREAN SECTION;  Surgeon: Winton Felt, MD;  Location: [facility];  Service: Obstetrics;  Laterality: N/A;   CESAREAN SECTION     colonoscopy  11/23/2018   stated had issues with sleep when in for procedure   COLONOSCOPY N/A 12/19/2023   Procedure: COLONOSCOPY;  Surgeon: Leigh Elspeth SQUIBB, MD;  Location: [facility];  Service: Gastroenterology;  Laterality: N/A;  patient had difficulty breathing during anesthesia thought due to sleep apnea.   ESOPHAGOGASTRODUODENOSCOPY (EGD) WITH PROPOFOL  N/A 08/27/2021   Procedure: ESOPHAGOGASTRODUODENOSCOPY (EGD) WITH PROPOFOL ;  Surgeon: Leigh Elspeth SQUIBB, MD;  Location: [facility];  Service: Gastroenterology;  Laterality: N/A;   OTHER SURGICAL HISTORY  2005   Uterine surgery    uterine cauterization     Patient Active Problem List   Diagnosis Date Noted   Allergic rhinitis 03/28/2024   Dermatitis 03/28/2024   Chronic chest pain 03/28/2024   History of colon polyps 12/19/2023   Dislocation of jaw 07/23/2022   Small vessel disease 06/29/2022   Type 2 diabetes mellitus with obesity 03/25/2022   Hypotension - iatrogenic 03/18/2022   Other  chronic pain 02/02/2022   Dyspepsia    Early satiety    IDA (iron deficiency anemia) 01/29/2021   Paroxysmal supraventricular tachycardia 10/01/2020   Class 1 obesity with serious comorbidity and body mass index (BMI) of 31.0 to 31.9 in adult 10/01/2020   Diabetes mellitus (HCC) 02/14/2020   Mixed diabetic hyperlipidemia associated with type 2 diabetes mellitus (HCC) 01/10/2020   Hypertension associated with diabetes (HCC) 01/10/2020   Diabetic neuropathy (HCC) 01/10/2020   Vitamin D  deficiency 01/10/2020   Diabetes mellitus with proteinuria (HCC) 08/20/2019   History of Helicobacter pylori infection 08/08/2019   Proteinuria 06/14/2019   Elevated sed rate 05/23/2019   Elevated C-reactive protein (CRP) 05/23/2019   Diabetes mellitus without complication (HCC) 05/23/2019   Dyslipidemia  (high LDL; low HDL) 05/23/2019   Type 2 diabetes mellitus with hyperglycemia, without long-term current use of insulin  (HCC) 02/17/2019   Hemoglobin A1C between 7% and 9% indicating borderline diabetic control (HCC) 02/17/2019   Breast cancer screening by mammogram 02/17/2019   Frequent headaches 02/14/2019   Anxiety 02/14/2019   Common migraine with intractable migraine 06/28/2017   Chest pain at rest 10/17/2016   Sinus tachycardia 08/18/2016   Prolonged Q-T interval on ECG 08/18/2016   Vertigo 07/22/2015   Abdominal discomfort 02/14/2015   Low back pain 02/14/2015   Abdominal pain 02/01/2015   Nausea without vomiting 02/01/2015   Environmental allergies 02/01/2015   Language barrier to communication 02/01/2015   Anemia 01/23/2015   Constipation 01/21/2015   Knee pain, bilateral 01/21/2015   Palpitations 09/18/2013   Shortness of breath 09/18/2013   Advanced maternal age in pregnancy in second trimester 06/19/2013   IBS (irritable bowel syndrome) 04/13/2003    PCP: Oley Bascom RAMAN, NP   REFERRING PROVIDER: Lorilee Sven SQUIBB, MD   REFERRING DIAG: M79.18 (ICD-10-CM) - Cervical myofascial pain syndrome   THERAPY DIAG:  Abnormal posture  Cervicalgia  Muscle weakness (generalized)  Radiculopathy, cervicothoracic region  Rationale for Evaluation and Treatment: Rehabilitation  ONSET DATE: chronic but recent  SUBJECTIVE:                                                                                                                                                                                                         SUBJECTIVE STATEMENT: Patient reports that her pains remains the same. She notes that she feels locked up after completing activities around the home and her whole body feels like it cannot move anymore. She also notes a headaches and increased sleepiness.   EVAL I have been to [facility] about  3 x. I have been doing exercises but I am not doing  better. I improve when I do PT but then it comes back. I have a pain doctor and getting lidocaine  injections in the shoulder and he said I needed to go to PT for my neck and get stronger. I feel pain into my hand when I have to wash dishes.  Even when sweeping and doing household chores, I feel sweaty and in pain.  I feel fine when I do exercises but pain always comes back. I even feel nauseated and weak Hand dominance: Right  PERTINENT HISTORY:  DMII, fibromyalgia , IBS, migraine, HTN, obesity, tachycardia   PAIN:  Are you having pain? Yes: NPRS scale: sometimes 10/10 at worst and 8 most of the time Pain location: neck and bil shoulders especially Right side Pain description: very   sharp and pulling Aggravating factors: any time I use my arms, household chores Relieving factors: lidocaine  injections and rest  PRECAUTIONS: None  RED FLAGS: None     WEIGHT BEARING RESTRICTIONS: No  FALLS:  Has patient fallen in last 6 months? No  LIVING ENVIRONMENT: Lives with: lives with their family and lives with their spouse Lives in: House/apartment Stairs: no issues with stairs Has following equipment at home: None  OCCUPATION: housewife   PLOF: Independent  PATIENT GOALS: decrease pain  NEXT MD VISIT: n/a  OBJECTIVE:  Note: Objective measures were completed at Evaluation unless otherwise noted.  DIAGNOSTIC FINDINGS:  Thoracic spine x-ray 04/09/22 (-) R shoulder x-ray 04/09/22 (-) No new imaging of her neck seen on Epic  PATIENT SURVEYS:  NDI:  NECK DISABILITY INDEX 05/01/2024  Date: 03-28-24 Score Score  Pain intensity 2 = The pain is moderate at the moment 3  2. Personal care (washing, dressing, etc.) 1 =  I can look after myself normally but it causes extra pain 1  3. Lifting 3 = Pain prevents me from lifting heavy weights but I can manage light to medium   weights if they are conveniently positioned 2  4. Reading 3 = I can't read as much as I want because of moderate pain  in my neck 3  5. Headaches 4 = I have severe headaches, which come frequently  4  6. Concentration 2 = I have a fair degree of difficulty in concentrating when I want to 3  7. Work 3 =  I cannot do my usual work 1  8. Driving 3 = I can't drive my car as long as I want because of moderate pain in my neck 2  9. Sleeping 3 =  My sleep is moderately disturbed (2-3 hrs sleepless) 1  10. Recreation 3 = I am able to engage in a few of my usual recreation activities because of pain in   my neck 2  Total 27/50 22/50   Minimum Detectable Change (90% confidence): 5 points or 10% points  COGNITION: Overall cognitive status: Within functional limits for tasks assessed  SENSATION: Pt feels numbness down to all of my right fingers  sometimes left  POSTURE: rounded shoulders and forward head  PALPATION: TTP over bil UT  r > L  tenderness over bil cervical paraspinals   CERVICAL ROM:   Active ROM A/PROM (deg) eval 05/01/2024  Flexion 40 pain 100%  Extension 40 75% pain  Right lateral flexion 25 tight and pain 100% stretch  Left lateral flexion 35 tight and pain 100% stretch  Right rotation 35 100%  Left rotation 30  pulls/pain on R 100%   (Blank rows = not tested)  UPPER EXTREMITY ROM:All WNL but with pain rising above 90 flex and abd      WNL - pain at 160 ABD B  Active ROM Right eval Left eval  Shoulder flexion    Shoulder extension    Shoulder abduction    Shoulder adduction    Shoulder extension    Shoulder internal rotation    Shoulder external rotation    Elbow flexion    Elbow extension    Wrist flexion    Wrist extension    Wrist ulnar deviation    Wrist radial deviation    Wrist pronation    Wrist supination     (Blank rows = not tested)  UPPER EXTREMITY MMT:    MMT Right eval Left eval Right 05/01/24 Left  05/01/24  Shoulder flexion 4+ 4+ 4+ 4+  Shoulder extension 3+ 3+ 4+ 4+  Shoulder abduction 4- 4    Shoulder adduction      Shoulder extension       Shoulder internal rotation 4- 4 5 5   Shoulder external rotation 4- 4 5 5   Middle trapezius      Lower trapezius      Elbow flexion      Elbow extension      Wrist flexion      Wrist extension      Wrist ulnar deviation      Wrist radial deviation      Wrist pronation      Wrist supination      Grip strength 32 lb 35lb     (Blank rows = not tested)  CERVICAL SPECIAL TESTS:  Neck flexor muscle endurance test: Positive, Upper limb tension test (ULTT): Positive, Spurling's test: Positive, and Distraction test: Positive  FUNCTIONAL TESTS:  5 times sit to stand: 9.58 sec  WNL DNFtest   TREATMENT DATE:  [facility] Adult PT Treatment:                                                DATE: 05/03/24 Therapeutic Exercise: UBE level 1 3'/3' fwd/bwd Seated cervical ext over towel 10 hold x7 (pain in hands) Upper trap stretch 2x30 BIL Neuromuscular re-ed: Seated horizontal ABD YTB 2x10 Seated B ER YTB x10 BIL  Seated serratus wall slide YTB not all wall 2 x 10  Rows YTB 2x10 Shoulder extension YTB 2x10   [facility] Adult PT Treatment:                                                DATE:  Therapeutic Activity:  Tests and measures   [facility] Adult PT Treatment:                                                DATE: 04/24/24 Therapeutic Exercise: UBE level 1 3'/3' fwd/bwd Seated cervical ext over towel 10 hold x10 SNAG 5x10 hold B  Neuromuscular re-ed: Supine, 1/2 foam roll parallel to spine, chest press x 10, 6# DB x 10 Seated horizontal ABD YTB 2x10 Seated B ER YTB  x10 BIL  Seated serratus wall slide YTB not all wall 2 x 10  Rows YTB 2x10 Shoulder extension YTB 2x10 Manual:  Active release B upper traps    PATIENT EDUCATION:  Education details: POC, Explanation of findings, issue HEP Person educated: Patient  through interpreter Education method: Explanation, Demonstration, Tactile cues, Verbal cues, and Handouts Education comprehension: verbalized understanding, returned demonstration,  tactile cues required, and needs further education  HOME EXERCISE PROGRAM: Access Code: [email or web address] URL: [email or web address] Date: 05/01/2024 Prepared by: Marijo Berber  Exercises - Seated Assisted Cervical Rotation with Towel  - 1 x daily - 7 x weekly - 3 sets - 10 reps - Cervical Extension AROM with Strap  - 1 x daily - 7 x weekly - 3 sets - 10 reps - Seated Shoulder W External Rotation on Swiss Ball  - 1 x daily - 7 x weekly - 3 sets - 10 reps - Standing Shoulder Row with Anchored Resistance  - 1 x daily - 7 x weekly - 3 sets - 10 reps - Shoulder extension with resistance - Neutral  - 1 x daily - 7 x weekly - 3 sets - 10 reps  ASSESSMENT:  CLINICAL IMPRESSION: Patient presents to PT reporting that her symptoms remain the same. She notes new symptoms of full body fatigue by the end of the day that she describes in an Arabic term that translates to English as petrified and that she can't move well. She also describes new onset headaches and increased sleepiness. Advised that if these symptoms persist to reach out to her MD. Session today continued to focus on shoulder and periscapular strengthening. She is somewhat limited by pain during session that she describes from her neck to her fingertips BIL. Continued explaining hurt vs harm as well as muscle fatigue vs pain throughout session. Patient continues to benefit from skilled PT services and should be progressed as able to improve functional independence.   EVAL Patient is a 49 y.o. female  who was seen today for physical therapy evaluation and treatment for neck pain with cervical radiculopathy and hx of fibromyalgia/migraines. Pt has muscle tightness and R> L UE weakness and tenderness with nerve muscle tension tests. DNF test can only hold 5 sec  ( normal at minimum 37 sec). Pt continues to have weak deep neck flexors and has had 2 previous visits to this clinic She reports pain relief in PT but has difficulty with  carryover into her daily life. Pt will benefit from skilled PT to address issues and improve carryover to home and household chores. .   OBJECTIVE IMPAIRMENTS: decreased activity tolerance, decreased coordination, decreased endurance, decreased mobility, decreased ROM, decreased strength, dizziness, increased fascial restrictions, increased muscle spasms, impaired flexibility, impaired sensation, impaired UE functional use, postural dysfunction, obesity, and pain.   ACTIVITY LIMITATIONS: carrying, lifting, sleeping, bathing, toileting, dressing, reach over head, hygiene/grooming, and caring for others  PARTICIPATION LIMITATIONS: meal prep, cleaning, laundry, driving, shopping, and community activity  PERSONAL FACTORS: DMII, fibromyalgia , IBS, migraine, HTN, obesity, tachycardia  are also affecting patient's functional outcome.   REHAB POTENTIAL: Good  CLINICAL DECISION MAKING: Evolving/moderate complexity  EVALUATION COMPLEXITY: Moderate   GOALS: Goals reviewed with patient? Yes  SHORT TERM GOALS: Target date: 04-21-2024  Pt will be I with initial HEP Baseline: needs much reinforcement Goal status: MET  2.  Pt will be able to maintain DNF test position for at least 10 sec Baseline: 5 sec 05/01/2024: 26 sec Goal status: MET  LONG TERM GOALS: Target date: 06-12-2024  Pt will be I with management and compliance with HEP  Baseline: needs reinforcement with compliance Goal status: MET  2.  Pt will be able to perform household chores  with 50% decreased pain/discomfort Baseline: at worst 10/10 05/01/2024: 9/10 Goal status: INITIAL  3.  Pt will demo R=L UE strength for lifting household items and carrying tasks at home Baseline:  R with decreased strength than L 05/01/2024: see objective  Goal status: MET  4.  Pt will improve NDI to </= to 22/50  to demo MCID Baseline: 27/50 05/01/2024: 22/50 Goal status: MET     PLAN:  PT FREQUENCY: 2x/week  PT DURATION: 6  weeks  PLANNED INTERVENTIONS: 97164- PT Re-evaluation, 97110-Therapeutic exercises, 97530- Therapeutic activity, 97112- Neuromuscular re-education, 97535- Self Care, [id number]- Manual therapy, [id number]- Electrical stimulation (unattended), [phone]- Ionotophoresis 4mg /ml Dexamethasone , 79439 (1-2 muscles), 20561 (3+ muscles)- Dry Needling, Patient/Family education, Taping, Joint mobilization, Spinal mobilization, Cryotherapy, and Moist heat  PLAN FOR NEXT SESSION: Review HEP and progress,    For all possible CPT codes, reference the Planned Interventions line above.     Check all conditions that are expected to impact treatment: {Conditions expected to impact treatment:Diabetes mellitus and Musculoskeletal disorders   If treatment provided at initial evaluation, no treatment charged due to lack of authorization.       Corean Pouch PTA  05/03/24 11:23 AM    "

## 2024-05-07 ENCOUNTER — Ambulatory Visit (INDEPENDENT_AMBULATORY_CARE_PROVIDER_SITE_OTHER): Admitting: Adult Health

## 2024-05-07 ENCOUNTER — Encounter (INDEPENDENT_AMBULATORY_CARE_PROVIDER_SITE_OTHER): Payer: Self-pay

## 2024-05-09 ENCOUNTER — Encounter (INDEPENDENT_AMBULATORY_CARE_PROVIDER_SITE_OTHER): Payer: Self-pay | Admitting: Adult Health

## 2024-05-09 ENCOUNTER — Ambulatory Visit

## 2024-05-09 DIAGNOSIS — M6281 Muscle weakness (generalized): Secondary | ICD-10-CM

## 2024-05-09 DIAGNOSIS — M5413 Radiculopathy, cervicothoracic region: Secondary | ICD-10-CM

## 2024-05-09 DIAGNOSIS — R293 Abnormal posture: Secondary | ICD-10-CM

## 2024-05-09 DIAGNOSIS — M542 Cervicalgia: Secondary | ICD-10-CM | POA: Diagnosis not present

## 2024-05-09 NOTE — Telephone Encounter (Signed)
 Betty Cain  (Key: [id number]) Ozempic  (0.25 or 0.5 MG/DOSE) 2MG /3ML pen-injectors

## 2024-05-09 NOTE — Therapy (Signed)
 " OUTPATIENT PHYSICAL THERAPY TREATMENT NOTE     Patient Name: Betty Cain MRN: [id number] DOB:06-16-1975, 49 y.o., female Today's Date: 05/09/2024  END OF SESSION:  PT End of Session - 05/09/24 1137     Visit Number 8    Number of Visits 13    Date for Recertification  06/12/24    Authorization Type [employer]    Authorization Time Period 05/03/24-06/01/24    Authorization - Visit Number 1    Authorization - Number of Visits 4    Progress Note Due on Visit 11    PT Start Time 1135    PT Stop Time 1213    PT Time Calculation (min) 38 min    Activity Tolerance Patient tolerated treatment well    Behavior During Therapy [facility] for tasks assessed/performed            Past Medical History:  Diagnosis Date   Allergy     Anemia    Anxiety 01/2019   Asthma    B12 deficiency    Back pain    Common migraine with intractable migraine 06/28/2017   Constipation    Diabetes (HCC) 02/2019   Fatigue    Fibromyalgia    GERD (gastroesophageal reflux disease)    pos H pylori   Hypertension    controlled with medication   IBS (irritable bowel syndrome) 2005   Joint pain    Neonatal death    Vaginal delivery, full term-lived x2 hours.    Palpitations    Shortness of breath    Shortness of breath on exertion    Spinal headache    Swelling of both lower extremities    Valvular heart disease    Vitamin D  deficiency 02/2019   Past Surgical History:  Procedure Laterality Date   ADENOIDECTOMY     and tonsils (as a child)   BIOPSY  08/27/2021   Procedure: BIOPSY;  Surgeon: Leigh Elspeth SQUIBB, MD;  Location: [facility];  Service: Gastroenterology;;   boil  2003   right elbow   CESAREAN SECTION     x4   CESAREAN SECTION  06/02/2011   Procedure: CESAREAN SECTION;  Surgeon: Norleen LULLA Server, MD;  Location: [facility];  Service: Gynecology;  Laterality: N/A;  Primary Cesarean Section Delivery Baby Boy @ 0004, Apgars 9/9   CESAREAN SECTION N/A 12/10/2013   Procedure: REPEAT CESAREAN  SECTION;  Surgeon: Winton Felt, MD;  Location: [facility];  Service: Obstetrics;  Laterality: N/A;   CESAREAN SECTION     colonoscopy  11/23/2018   stated had issues with sleep when in for procedure   COLONOSCOPY N/A 12/19/2023   Procedure: COLONOSCOPY;  Surgeon: Leigh Elspeth SQUIBB, MD;  Location: [facility];  Service: Gastroenterology;  Laterality: N/A;  patient had difficulty breathing during anesthesia thought due to sleep apnea.   ESOPHAGOGASTRODUODENOSCOPY (EGD) WITH PROPOFOL  N/A 08/27/2021   Procedure: ESOPHAGOGASTRODUODENOSCOPY (EGD) WITH PROPOFOL ;  Surgeon: Leigh Elspeth SQUIBB, MD;  Location: [facility];  Service: Gastroenterology;  Laterality: N/A;   OTHER SURGICAL HISTORY  2005   Uterine surgery    uterine cauterization     Patient Active Problem List   Diagnosis Date Noted   Allergic rhinitis 03/28/2024   Dermatitis 03/28/2024   Chronic chest pain 03/28/2024   History of colon polyps 12/19/2023   Dislocation of jaw 07/23/2022   Small vessel disease 06/29/2022   Type 2 diabetes mellitus with obesity 03/25/2022   Hypotension - iatrogenic 03/18/2022   Other chronic pain 02/02/2022  Dyspepsia    Early satiety    IDA (iron deficiency anemia) 01/29/2021   Paroxysmal supraventricular tachycardia 10/01/2020   Class 1 obesity with serious comorbidity and body mass index (BMI) of 31.0 to 31.9 in adult 10/01/2020   Diabetes mellitus (HCC) 02/14/2020   Mixed diabetic hyperlipidemia associated with type 2 diabetes mellitus (HCC) 01/10/2020   Hypertension associated with diabetes (HCC) 01/10/2020   Diabetic neuropathy (HCC) 01/10/2020   Vitamin D  deficiency 01/10/2020   Diabetes mellitus with proteinuria (HCC) 08/20/2019   History of Helicobacter pylori infection 08/08/2019   Proteinuria 06/14/2019   Elevated sed rate 05/23/2019   Elevated C-reactive protein (CRP) 05/23/2019   Diabetes mellitus without complication (HCC) 05/23/2019   Dyslipidemia (high LDL; low HDL) 05/23/2019    Type 2 diabetes mellitus with hyperglycemia, without long-term current use of insulin  (HCC) 02/17/2019   Hemoglobin A1C between 7% and 9% indicating borderline diabetic control (HCC) 02/17/2019   Breast cancer screening by mammogram 02/17/2019   Frequent headaches 02/14/2019   Anxiety 02/14/2019   Common migraine with intractable migraine 06/28/2017   Chest pain at rest 10/17/2016   Sinus tachycardia 08/18/2016   Prolonged Q-T interval on ECG 08/18/2016   Vertigo 07/22/2015   Abdominal discomfort 02/14/2015   Low back pain 02/14/2015   Abdominal pain 02/01/2015   Nausea without vomiting 02/01/2015   Environmental allergies 02/01/2015   Language barrier to communication 02/01/2015   Anemia 01/23/2015   Constipation 01/21/2015   Knee pain, bilateral 01/21/2015   Palpitations 09/18/2013   Shortness of breath 09/18/2013   Advanced maternal age in pregnancy in second trimester 06/19/2013   IBS (irritable bowel syndrome) 04/13/2003    PCP: Oley Bascom RAMAN, NP   REFERRING PROVIDER: Lorilee Sven SQUIBB, MD   REFERRING DIAG: M79.18 (ICD-10-CM) - Cervical myofascial pain syndrome   THERAPY DIAG:  Abnormal posture  Cervicalgia  Muscle weakness (generalized)  Radiculopathy, cervicothoracic region  Rationale for Evaluation and Treatment: Rehabilitation  ONSET DATE: chronic but recent  SUBJECTIVE:                                                                                                                                                                                                         SUBJECTIVE STATEMENT:  Patient reports that since beginning of POC, she feels that her pain has been improved after PT sessions. However, she continues to have pain at night, but not as limiting. She has pain with cooking and washing dishes. She feels very fatigued after PT sessions, but would like to continue with PT sessions. She  had a headache 3 days ago, which is not responding to her  migraine medications. This has been present for about 1 week.    EVAL I have been to [facility] clinic about 3 x. I have been doing exercises but I am not doing better. I improve when I do PT but then it comes back. I have a pain doctor and getting lidocaine  injections in the shoulder and he said I needed to go to PT for my neck and get stronger. I feel pain into my hand when I have to wash dishes.  Even when sweeping and doing household chores, I feel sweaty and in pain.  I feel fine when I do exercises but pain always comes back. I even feel nauseated and weak Hand dominance: Right  PERTINENT HISTORY:  DMII, fibromyalgia , IBS, migraine, HTN, obesity, tachycardia   PAIN:  Are you having pain? Yes: NPRS scale: sometimes 10/10 at worst and 8 most of the time Pain location: neck and bil shoulders especially Right side Pain description: very   sharp and pulling Aggravating factors: any time I use my arms, household chores Relieving factors: lidocaine  injections and rest  PRECAUTIONS: None  RED FLAGS: None     WEIGHT BEARING RESTRICTIONS: No  FALLS:  Has patient fallen in last 6 months? No  LIVING ENVIRONMENT: Lives with: lives with their family and lives with their spouse Lives in: House/apartment Stairs: no issues with stairs Has following equipment at home: None  OCCUPATION: housewife   PLOF: Independent  PATIENT GOALS: decrease pain  NEXT MD VISIT: n/a  OBJECTIVE:  Note: Objective measures were completed at Evaluation unless otherwise noted.  DIAGNOSTIC FINDINGS:  Thoracic spine x-ray 04/09/22 (-) R shoulder x-ray 04/09/22 (-) No new imaging of her neck seen on Epic  PATIENT SURVEYS:  NDI:  NECK DISABILITY INDEX 05/01/2024  Date: 03-28-24 Score Score  Pain intensity 2 = The pain is moderate at the moment 3  2. Personal care (washing, dressing, etc.) 1 =  I can look after myself normally but it causes extra pain 1  3. Lifting 3 = Pain prevents me from lifting  heavy weights but I can manage light to medium   weights if they are conveniently positioned 2  4. Reading 3 = I can't read as much as I want because of moderate pain in my neck 3  5. Headaches 4 = I have severe headaches, which come frequently  4  6. Concentration 2 = I have a fair degree of difficulty in concentrating when I want to 3  7. Work 3 =  I cannot do my usual work 1  8. Driving 3 = I can't drive my car as long as I want because of moderate pain in my neck 2  9. Sleeping 3 =  My sleep is moderately disturbed (2-3 hrs sleepless) 1  10. Recreation 3 = I am able to engage in a few of my usual recreation activities because of pain in   my neck 2  Total 27/50 22/50   Minimum Detectable Change (90% confidence): 5 points or 10% points  COGNITION: Overall cognitive status: Within functional limits for tasks assessed  SENSATION: Pt feels numbness down to all of my right fingers  sometimes left  POSTURE: rounded shoulders and forward head  PALPATION: TTP over bil UT  r > L  tenderness over bil cervical paraspinals   CERVICAL ROM:   Active ROM A/PROM (deg) eval 05/01/2024  Flexion 40 pain 100%  Extension 40 75% pain  Right lateral flexion 25 tight and pain 100% stretch  Left lateral flexion 35 tight and pain 100% stretch  Right rotation 35 100%  Left rotation 30 pulls/pain on R 100%   (Blank rows = not tested)  UPPER EXTREMITY ROM:All WNL but with pain rising above 90 flex and abd      WNL - pain at 160 ABD B  Active ROM Right eval Left eval  Shoulder flexion    Shoulder extension    Shoulder abduction    Shoulder adduction    Shoulder extension    Shoulder internal rotation    Shoulder external rotation    Elbow flexion    Elbow extension    Wrist flexion    Wrist extension    Wrist ulnar deviation    Wrist radial deviation    Wrist pronation    Wrist supination     (Blank rows = not tested)  UPPER EXTREMITY MMT:    MMT Right eval Left eval  Right 05/01/24 Left  05/01/24  Shoulder flexion 4+ 4+ 4+ 4+  Shoulder extension 3+ 3+ 4+ 4+  Shoulder abduction 4- 4    Shoulder adduction      Shoulder extension      Shoulder internal rotation 4- 4 5 5   Shoulder external rotation 4- 4 5 5   Middle trapezius      Lower trapezius      Elbow flexion      Elbow extension      Wrist flexion      Wrist extension      Wrist ulnar deviation      Wrist radial deviation      Wrist pronation      Wrist supination      Grip strength 32 lb 35lb     (Blank rows = not tested)  CERVICAL SPECIAL TESTS:  Neck flexor muscle endurance test: Positive, Upper limb tension test (ULTT): Positive, Spurling's test: Positive, and Distraction test: Positive  FUNCTIONAL TESTS:  5 times sit to stand: 9.58 sec  WNL DNFtest   TREATMENT DATE:   [facility] Adult PT Treatment:                                                DATE: 05/09/24 Therapeutic Exercise: UBE level 1 3'/3' fwd/bwd Seated cervical retraction, 3 sec hold, 2 x 10  Pec stretch at doorway 2 x 30  Upper trap stretch 2x30 BIL Reviewed HEP   Neuromuscular re-ed: Seated B ER YTB x10 BIL, unable to tolerate more Rows YTB 2x10 Shoulder extension YTB 2x10  Therapeutic Activity:  Additional subjective assessment at start of today's visit. Patient education regarding indications and recommendation to f/u with MD regarding new headache symptoms.   [facility] Adult PT Treatment:                                                DATE: 05/03/24 Therapeutic Exercise: UBE level 1 3'/3' fwd/bwd Seated cervical ext over towel 10 hold x7 (pain in hands) Upper trap stretch 2x30 BIL Neuromuscular re-ed: Seated horizontal ABD YTB 2x10 Seated B ER YTB x10 BIL  Seated serratus wall slide YTB not all wall 2 x 10  Rows  YTB 2x10 Shoulder extension YTB 2x10      PATIENT EDUCATION:  Education details: POC, Explanation of findings, issue HEP Person educated: Patient  through interpreter Education method:  Explanation, Demonstration, Tactile cues, Verbal cues, and Handouts Education comprehension: verbalized understanding, returned demonstration, tactile cues required, and needs further education  HOME EXERCISE PROGRAM: Access Code: [email or web address] URL: [email or web address] Date: 05/01/2024 Prepared by: Marijo Berber  Exercises - Seated Assisted Cervical Rotation with Towel  - 1 x daily - 7 x weekly - 3 sets - 10 reps - Cervical Extension AROM with Strap  - 1 x daily - 7 x weekly - 3 sets - 10 reps - Seated Shoulder W External Rotation on Swiss Ball  - 1 x daily - 7 x weekly - 3 sets - 10 reps - Standing Shoulder Row with Anchored Resistance  - 1 x daily - 7 x weekly - 3 sets - 10 reps - Shoulder extension with resistance - Neutral  - 1 x daily - 7 x weekly - 3 sets - 10 reps  ASSESSMENT:  CLINICAL IMPRESSION:  Patient had fair tolerance of today's exercises. She is significantly limited by pain severity and reports increased pain with UBE and resisted shoulder ER. She continues to require verbal and visual cueing for CS stretches. Encouraged patient to f/u with neurologist regarding new headache symptoms. We will otherwise continue per POC.    EVAL Patient is a 49 y.o. female  who was seen today for physical therapy evaluation and treatment for neck pain with cervical radiculopathy and hx of fibromyalgia/migraines. Pt has muscle tightness and R> L UE weakness and tenderness with nerve muscle tension tests. DNF test can only hold 5 sec  ( normal at minimum 37 sec). Pt continues to have weak deep neck flexors and has had 2 previous visits to this clinic She reports pain relief in PT but has difficulty with carryover into her daily life. Pt will benefit from skilled PT to address issues and improve carryover to home and household chores. .   OBJECTIVE IMPAIRMENTS: decreased activity tolerance, decreased coordination, decreased endurance, decreased mobility, decreased ROM, decreased  strength, dizziness, increased fascial restrictions, increased muscle spasms, impaired flexibility, impaired sensation, impaired UE functional use, postural dysfunction, obesity, and pain.   ACTIVITY LIMITATIONS: carrying, lifting, sleeping, bathing, toileting, dressing, reach over head, hygiene/grooming, and caring for others  PARTICIPATION LIMITATIONS: meal prep, cleaning, laundry, driving, shopping, and community activity  PERSONAL FACTORS: DMII, fibromyalgia , IBS, migraine, HTN, obesity, tachycardia  are also affecting patient's functional outcome.   REHAB POTENTIAL: Good  CLINICAL DECISION MAKING: Evolving/moderate complexity  EVALUATION COMPLEXITY: Moderate   GOALS: Goals reviewed with patient? Yes  SHORT TERM GOALS: Target date: 04-21-2024  Pt will be I with initial HEP Baseline: needs much reinforcement Goal status: MET  2.  Pt will be able to maintain DNF test position for at least 10 sec Baseline: 5 sec 05/01/2024: 26 sec Goal status: MET    LONG TERM GOALS: Target date: 06-12-2024  Pt will be I with management and compliance with HEP  Baseline: needs reinforcement with compliance Goal status: MET  2.  Pt will be able to perform household chores  with 50% decreased pain/discomfort Baseline: at worst 10/10 05/01/2024: 9/10 Goal status: INITIAL  3.  Pt will demo R=L UE strength for lifting household items and carrying tasks at home Baseline:  R with decreased strength than L 05/01/2024: see objective  Goal status: MET  4.  Pt will improve NDI to </=  to 22/50  to demo MCID Baseline: 27/50 05/01/2024: 22/50 Goal status: MET     PLAN:  PT FREQUENCY: 2x/week  PT DURATION: 6 weeks  PLANNED INTERVENTIONS: 97164- PT Re-evaluation, 97110-Therapeutic exercises, 97530- Therapeutic activity, 97112- Neuromuscular re-education, 97535- Self Care, [id number]- Manual therapy, [id number]- Electrical stimulation (unattended), [phone]- Ionotophoresis 4mg /ml Dexamethasone , 79439 (1-2  muscles), 20561 (3+ muscles)- Dry Needling, Patient/Family education, Taping, Joint mobilization, Spinal mobilization, Cryotherapy, and Moist heat  PLAN FOR NEXT SESSION: Review HEP and progress,    For all possible CPT codes, reference the Planned Interventions line above.     Check all conditions that are expected to impact treatment: {Conditions expected to impact treatment:Diabetes mellitus and Musculoskeletal disorders   If treatment provided at initial evaluation, no treatment charged due to lack of authorization.       Marko Molt, PT, DPT  05/09/2024 1:37 PM     "

## 2024-05-23 ENCOUNTER — Ambulatory Visit

## 2024-05-24 ENCOUNTER — Ambulatory Visit (INDEPENDENT_AMBULATORY_CARE_PROVIDER_SITE_OTHER): Admitting: Adult Health

## 2024-05-30 ENCOUNTER — Ambulatory Visit

## 2024-06-06 ENCOUNTER — Ambulatory Visit

## 2024-06-15 ENCOUNTER — Encounter: Admitting: Physical Medicine and Rehabilitation

## 2024-06-15 ENCOUNTER — Ambulatory Visit: Admitting: Pulmonary Disease

## 2024-06-19 ENCOUNTER — Ambulatory Visit: Admitting: Neurology

## 2024-06-27 ENCOUNTER — Ambulatory Visit: Admitting: Nurse Practitioner

## 2024-07-03 ENCOUNTER — Ambulatory Visit: Admitting: Gastroenterology
